# Patient Record
Sex: Male | Born: 1940 | Race: White | Hispanic: No | Marital: Married | Smoking: Former smoker
Health system: Southern US, Community
[De-identification: ages and names within clinical notes are randomized; demographics above are authoritative.]

## PROBLEM LIST (undated history)

## (undated) DIAGNOSIS — N529 Male erectile dysfunction, unspecified: Secondary | ICD-10-CM

## (undated) DIAGNOSIS — R531 Weakness: Secondary | ICD-10-CM

## (undated) DIAGNOSIS — I639 Cerebral infarction, unspecified: Secondary | ICD-10-CM

## (undated) DIAGNOSIS — Z8614 Personal history of Methicillin resistant Staphylococcus aureus infection: Secondary | ICD-10-CM

## (undated) DIAGNOSIS — E538 Deficiency of other specified B group vitamins: Secondary | ICD-10-CM

## (undated) DIAGNOSIS — Z9989 Dependence on other enabling machines and devices: Secondary | ICD-10-CM

## (undated) DIAGNOSIS — H269 Unspecified cataract: Secondary | ICD-10-CM

## (undated) DIAGNOSIS — M1990 Unspecified osteoarthritis, unspecified site: Secondary | ICD-10-CM

## (undated) DIAGNOSIS — K5792 Diverticulitis of intestine, part unspecified, without perforation or abscess without bleeding: Secondary | ICD-10-CM

## (undated) DIAGNOSIS — I10 Essential (primary) hypertension: Secondary | ICD-10-CM

## (undated) DIAGNOSIS — E7800 Pure hypercholesterolemia, unspecified: Secondary | ICD-10-CM

## (undated) DIAGNOSIS — I5032 Chronic diastolic (congestive) heart failure: Secondary | ICD-10-CM

## (undated) DIAGNOSIS — Z8719 Personal history of other diseases of the digestive system: Secondary | ICD-10-CM

## (undated) DIAGNOSIS — C801 Malignant (primary) neoplasm, unspecified: Secondary | ICD-10-CM

## (undated) DIAGNOSIS — H548 Legal blindness, as defined in USA: Secondary | ICD-10-CM

## (undated) DIAGNOSIS — J189 Pneumonia, unspecified organism: Secondary | ICD-10-CM

## (undated) DIAGNOSIS — Z8711 Personal history of peptic ulcer disease: Secondary | ICD-10-CM

## (undated) DIAGNOSIS — F40240 Claustrophobia: Secondary | ICD-10-CM

## (undated) DIAGNOSIS — E119 Type 2 diabetes mellitus without complications: Secondary | ICD-10-CM

## (undated) DIAGNOSIS — I2510 Atherosclerotic heart disease of native coronary artery without angina pectoris: Secondary | ICD-10-CM

## (undated) DIAGNOSIS — I480 Paroxysmal atrial fibrillation: Secondary | ICD-10-CM

## (undated) DIAGNOSIS — J449 Chronic obstructive pulmonary disease, unspecified: Secondary | ICD-10-CM

## (undated) DIAGNOSIS — R600 Localized edema: Secondary | ICD-10-CM

## (undated) DIAGNOSIS — I499 Cardiac arrhythmia, unspecified: Secondary | ICD-10-CM

## (undated) DIAGNOSIS — G4733 Obstructive sleep apnea (adult) (pediatric): Secondary | ICD-10-CM

## (undated) DIAGNOSIS — I878 Other specified disorders of veins: Secondary | ICD-10-CM

## (undated) DIAGNOSIS — I214 Non-ST elevation (NSTEMI) myocardial infarction: Secondary | ICD-10-CM

## (undated) DIAGNOSIS — E1140 Type 2 diabetes mellitus with diabetic neuropathy, unspecified: Secondary | ICD-10-CM

## (undated) DIAGNOSIS — H53489 Generalized contraction of visual field, unspecified eye: Secondary | ICD-10-CM

## (undated) DIAGNOSIS — K8020 Calculus of gallbladder without cholecystitis without obstruction: Secondary | ICD-10-CM

## (undated) DIAGNOSIS — G629 Polyneuropathy, unspecified: Secondary | ICD-10-CM

## (undated) DIAGNOSIS — H9190 Unspecified hearing loss, unspecified ear: Secondary | ICD-10-CM

## (undated) HISTORY — DX: Diverticulitis of intestine, part unspecified, without perforation or abscess without bleeding: K57.92

## (undated) HISTORY — PX: CARDIAC CATHETERIZATION: SHX172

## (undated) HISTORY — DX: Atherosclerotic heart disease of native coronary artery without angina pectoris: I25.10

## (undated) HISTORY — PX: CEREBRAL ANGIOGRAM: SHX1326

## (undated) HISTORY — PX: TOE AMPUTATION: SHX809

## (undated) HISTORY — PX: COLONOSCOPY: SHX174

---

## 1998-06-02 ENCOUNTER — Encounter: Admission: RE | Admit: 1998-06-02 | Discharge: 1998-08-31 | Payer: Self-pay | Admitting: Internal Medicine

## 1998-10-07 ENCOUNTER — Encounter: Admission: RE | Admit: 1998-10-07 | Discharge: 1999-01-02 | Payer: Self-pay | Admitting: Internal Medicine

## 1999-01-20 ENCOUNTER — Encounter: Admission: RE | Admit: 1999-01-20 | Discharge: 1999-01-22 | Payer: Self-pay | Admitting: Internal Medicine

## 1999-04-21 ENCOUNTER — Encounter (HOSPITAL_BASED_OUTPATIENT_CLINIC_OR_DEPARTMENT_OTHER): Payer: Self-pay | Admitting: Internal Medicine

## 1999-04-21 ENCOUNTER — Encounter: Admission: RE | Admit: 1999-04-21 | Discharge: 1999-04-23 | Payer: Self-pay | Admitting: Orthopedic Surgery

## 1999-05-21 ENCOUNTER — Encounter: Payer: Self-pay | Admitting: *Deleted

## 1999-05-21 ENCOUNTER — Inpatient Hospital Stay (HOSPITAL_COMMUNITY): Admission: AD | Admit: 1999-05-21 | Discharge: 1999-05-23 | Payer: Self-pay | Admitting: *Deleted

## 1999-06-23 ENCOUNTER — Encounter: Admission: RE | Admit: 1999-06-23 | Discharge: 1999-09-21 | Payer: Self-pay | Admitting: Orthopedic Surgery

## 2000-02-05 ENCOUNTER — Inpatient Hospital Stay (HOSPITAL_COMMUNITY): Admission: EM | Admit: 2000-02-05 | Discharge: 2000-02-09 | Payer: Self-pay | Admitting: Emergency Medicine

## 2000-02-05 ENCOUNTER — Encounter: Payer: Self-pay | Admitting: Neurology

## 2000-02-08 ENCOUNTER — Encounter: Payer: Self-pay | Admitting: Emergency Medicine

## 2000-02-17 ENCOUNTER — Encounter: Admission: RE | Admit: 2000-02-17 | Discharge: 2000-03-02 | Payer: Self-pay | Admitting: Internal Medicine

## 2000-08-16 ENCOUNTER — Ambulatory Visit (HOSPITAL_BASED_OUTPATIENT_CLINIC_OR_DEPARTMENT_OTHER): Admission: RE | Admit: 2000-08-16 | Discharge: 2000-08-16 | Payer: Self-pay | Admitting: Pulmonary Disease

## 2004-12-18 ENCOUNTER — Ambulatory Visit: Payer: Self-pay | Admitting: Internal Medicine

## 2005-03-15 ENCOUNTER — Ambulatory Visit: Payer: Self-pay | Admitting: Internal Medicine

## 2005-03-17 ENCOUNTER — Ambulatory Visit: Payer: Self-pay | Admitting: Internal Medicine

## 2005-06-29 ENCOUNTER — Ambulatory Visit: Payer: Self-pay | Admitting: Internal Medicine

## 2005-07-07 ENCOUNTER — Ambulatory Visit: Payer: Self-pay | Admitting: Internal Medicine

## 2005-09-24 ENCOUNTER — Ambulatory Visit: Payer: Self-pay | Admitting: Internal Medicine

## 2005-10-06 ENCOUNTER — Ambulatory Visit: Payer: Self-pay | Admitting: Internal Medicine

## 2005-12-27 ENCOUNTER — Ambulatory Visit: Payer: Self-pay | Admitting: Internal Medicine

## 2006-01-05 ENCOUNTER — Ambulatory Visit: Payer: Self-pay | Admitting: Internal Medicine

## 2006-03-29 ENCOUNTER — Ambulatory Visit: Payer: Self-pay | Admitting: Internal Medicine

## 2006-04-05 ENCOUNTER — Ambulatory Visit: Payer: Self-pay | Admitting: Internal Medicine

## 2006-06-27 ENCOUNTER — Ambulatory Visit: Payer: Self-pay | Admitting: Internal Medicine

## 2006-07-05 ENCOUNTER — Ambulatory Visit: Payer: Self-pay | Admitting: Internal Medicine

## 2006-08-16 ENCOUNTER — Ambulatory Visit: Payer: Self-pay | Admitting: Gastroenterology

## 2006-09-19 ENCOUNTER — Ambulatory Visit: Payer: Self-pay | Admitting: Gastroenterology

## 2006-09-30 ENCOUNTER — Ambulatory Visit: Payer: Self-pay | Admitting: Internal Medicine

## 2006-09-30 LAB — CONVERTED CEMR LAB
Calcium: 8.9 mg/dL (ref 8.4–10.5)
GFR calc non Af Amer: 71 mL/min
Glomerular Filtration Rate, Af Am: 86 mL/min/{1.73_m2}
Glucose, Bld: 196 mg/dL — ABNORMAL HIGH (ref 70–99)
Potassium: 4.4 meq/L (ref 3.5–5.1)
Sodium: 139 meq/L (ref 135–145)

## 2006-10-05 ENCOUNTER — Ambulatory Visit: Payer: Self-pay | Admitting: Internal Medicine

## 2006-12-22 ENCOUNTER — Ambulatory Visit: Payer: Self-pay | Admitting: Internal Medicine

## 2006-12-22 LAB — CONVERTED CEMR LAB
BUN: 8 mg/dL (ref 6–23)
Chol/HDL Ratio, serum: 2.7
Creatinine, Ser: 1 mg/dL (ref 0.4–1.5)
LDL Cholesterol: 61 mg/dL (ref 0–99)
VLDL: 15 mg/dL (ref 0–40)

## 2006-12-28 ENCOUNTER — Ambulatory Visit: Payer: Self-pay | Admitting: Internal Medicine

## 2007-03-30 ENCOUNTER — Ambulatory Visit: Payer: Self-pay | Admitting: Internal Medicine

## 2007-03-30 LAB — CONVERTED CEMR LAB
ALT: 19 units/L (ref 0–40)
AST: 19 units/L (ref 0–37)
Albumin: 3.4 g/dL — ABNORMAL LOW (ref 3.5–5.2)
Bilirubin, Direct: 0.1 mg/dL (ref 0.0–0.3)
Calcium: 9 mg/dL (ref 8.4–10.5)
Chloride: 105 meq/L (ref 96–112)
Creatinine, Ser: 0.8 mg/dL (ref 0.4–1.5)
Glucose, Bld: 163 mg/dL — ABNORMAL HIGH (ref 70–99)
Hgb A1c MFr Bld: 7.1 % — ABNORMAL HIGH (ref 4.6–6.0)
Sodium: 139 meq/L (ref 135–145)
Total Bilirubin: 0.6 mg/dL (ref 0.3–1.2)

## 2007-04-04 ENCOUNTER — Ambulatory Visit: Payer: Self-pay | Admitting: Internal Medicine

## 2007-07-10 ENCOUNTER — Ambulatory Visit: Payer: Self-pay | Admitting: Internal Medicine

## 2007-07-10 LAB — CONVERTED CEMR LAB
ALT: 13 units/L (ref 0–53)
Creatinine, Ser: 0.8 mg/dL (ref 0.4–1.5)
Hgb A1c MFr Bld: 7.7 % — ABNORMAL HIGH (ref 4.6–6.0)
Total CHOL/HDL Ratio: 3.4
Triglycerides: 75 mg/dL (ref 0–149)
VLDL: 15 mg/dL (ref 0–40)

## 2007-07-12 DIAGNOSIS — I2510 Atherosclerotic heart disease of native coronary artery without angina pectoris: Secondary | ICD-10-CM | POA: Insufficient documentation

## 2007-07-12 DIAGNOSIS — I872 Venous insufficiency (chronic) (peripheral): Secondary | ICD-10-CM | POA: Insufficient documentation

## 2007-07-12 DIAGNOSIS — Z8673 Personal history of transient ischemic attack (TIA), and cerebral infarction without residual deficits: Secondary | ICD-10-CM | POA: Insufficient documentation

## 2007-07-12 DIAGNOSIS — E291 Testicular hypofunction: Secondary | ICD-10-CM

## 2007-07-12 DIAGNOSIS — E1149 Type 2 diabetes mellitus with other diabetic neurological complication: Secondary | ICD-10-CM

## 2007-07-12 DIAGNOSIS — Z8719 Personal history of other diseases of the digestive system: Secondary | ICD-10-CM

## 2007-07-12 DIAGNOSIS — E538 Deficiency of other specified B group vitamins: Secondary | ICD-10-CM | POA: Insufficient documentation

## 2007-07-12 DIAGNOSIS — H5310 Unspecified subjective visual disturbances: Secondary | ICD-10-CM | POA: Insufficient documentation

## 2007-07-12 DIAGNOSIS — R413 Other amnesia: Secondary | ICD-10-CM

## 2007-07-12 DIAGNOSIS — I10 Essential (primary) hypertension: Secondary | ICD-10-CM

## 2007-07-12 DIAGNOSIS — I252 Old myocardial infarction: Secondary | ICD-10-CM | POA: Insufficient documentation

## 2007-07-12 DIAGNOSIS — E1139 Type 2 diabetes mellitus with other diabetic ophthalmic complication: Secondary | ICD-10-CM

## 2007-07-14 ENCOUNTER — Ambulatory Visit: Payer: Self-pay | Admitting: Internal Medicine

## 2007-10-02 ENCOUNTER — Ambulatory Visit: Payer: Self-pay | Admitting: Internal Medicine

## 2007-10-02 LAB — CONVERTED CEMR LAB
AST: 21 units/L (ref 0–37)
Albumin: 3.3 g/dL — ABNORMAL LOW (ref 3.5–5.2)
Bilirubin, Direct: 0.2 mg/dL (ref 0.0–0.3)
Chloride: 106 meq/L (ref 96–112)
Creatinine, Ser: 0.9 mg/dL (ref 0.4–1.5)
Glucose, Bld: 187 mg/dL — ABNORMAL HIGH (ref 70–99)
Hgb A1c MFr Bld: 8 % — ABNORMAL HIGH (ref 4.6–6.0)
Potassium: 4.5 meq/L (ref 3.5–5.1)
Sodium: 143 meq/L (ref 135–145)
Total Bilirubin: 0.7 mg/dL (ref 0.3–1.2)
Total Protein: 6.3 g/dL (ref 6.0–8.3)
Vitamin B-12: 706 pg/mL (ref 211–911)

## 2007-10-11 ENCOUNTER — Ambulatory Visit: Payer: Self-pay | Admitting: Internal Medicine

## 2007-10-11 ENCOUNTER — Encounter: Payer: Self-pay | Admitting: Internal Medicine

## 2007-10-11 DIAGNOSIS — E1149 Type 2 diabetes mellitus with other diabetic neurological complication: Secondary | ICD-10-CM

## 2007-10-11 DIAGNOSIS — R609 Edema, unspecified: Secondary | ICD-10-CM

## 2007-11-03 ENCOUNTER — Encounter: Payer: Self-pay | Admitting: Internal Medicine

## 2008-02-15 ENCOUNTER — Telehealth: Payer: Self-pay | Admitting: Internal Medicine

## 2008-04-06 LAB — HM COLONOSCOPY

## 2008-04-15 ENCOUNTER — Ambulatory Visit: Payer: Self-pay | Admitting: Internal Medicine

## 2008-04-15 LAB — CONVERTED CEMR LAB
ALT: 17 units/L (ref 0–53)
Alkaline Phosphatase: 52 units/L (ref 39–117)
Bilirubin, Direct: 0.2 mg/dL (ref 0.0–0.3)
CO2: 30 meq/L (ref 19–32)
Calcium: 8.9 mg/dL (ref 8.4–10.5)
Chloride: 108 meq/L (ref 96–112)
Glucose, Bld: 150 mg/dL — ABNORMAL HIGH (ref 70–99)
Potassium: 4.4 meq/L (ref 3.5–5.1)
Sodium: 141 meq/L (ref 135–145)
Total Bilirubin: 0.7 mg/dL (ref 0.3–1.2)
Total CHOL/HDL Ratio: 3.2
Total Protein: 6.2 g/dL (ref 6.0–8.3)

## 2008-04-24 ENCOUNTER — Ambulatory Visit: Payer: Self-pay | Admitting: Internal Medicine

## 2008-04-24 DIAGNOSIS — R0602 Shortness of breath: Secondary | ICD-10-CM | POA: Insufficient documentation

## 2008-05-08 ENCOUNTER — Telehealth: Payer: Self-pay | Admitting: Internal Medicine

## 2008-08-20 ENCOUNTER — Ambulatory Visit: Payer: Self-pay | Admitting: Internal Medicine

## 2008-08-20 LAB — CONVERTED CEMR LAB
BUN: 15 mg/dL (ref 6–23)
CO2: 31 meq/L (ref 19–32)
Calcium: 9 mg/dL (ref 8.4–10.5)
Glucose, Bld: 159 mg/dL — ABNORMAL HIGH (ref 70–99)
PSA: 1.15 ng/mL (ref 0.10–4.00)
Sodium: 141 meq/L (ref 135–145)

## 2008-08-27 ENCOUNTER — Ambulatory Visit: Payer: Self-pay | Admitting: Internal Medicine

## 2008-09-24 ENCOUNTER — Encounter: Payer: Self-pay | Admitting: Internal Medicine

## 2008-09-25 ENCOUNTER — Ambulatory Visit: Payer: Self-pay | Admitting: Internal Medicine

## 2008-11-22 ENCOUNTER — Encounter: Payer: Self-pay | Admitting: Internal Medicine

## 2008-11-29 ENCOUNTER — Ambulatory Visit: Payer: Self-pay | Admitting: Vascular Surgery

## 2008-12-09 ENCOUNTER — Telehealth: Payer: Self-pay | Admitting: Internal Medicine

## 2009-01-10 ENCOUNTER — Ambulatory Visit: Payer: Self-pay | Admitting: Internal Medicine

## 2009-01-10 LAB — CONVERTED CEMR LAB
CO2: 32 meq/L (ref 19–32)
Chloride: 103 meq/L (ref 96–112)
Creatinine, Ser: 0.8 mg/dL (ref 0.4–1.5)
Glucose, Bld: 167 mg/dL — ABNORMAL HIGH (ref 70–99)
Hgb A1c MFr Bld: 6.8 % — ABNORMAL HIGH (ref 4.6–6.0)
Sodium: 140 meq/L (ref 135–145)

## 2009-01-22 ENCOUNTER — Ambulatory Visit: Payer: Self-pay | Admitting: Internal Medicine

## 2009-01-22 DIAGNOSIS — M8610 Other acute osteomyelitis, unspecified site: Secondary | ICD-10-CM | POA: Insufficient documentation

## 2009-01-22 DIAGNOSIS — L723 Sebaceous cyst: Secondary | ICD-10-CM

## 2009-03-06 ENCOUNTER — Emergency Department (HOSPITAL_COMMUNITY): Admission: EM | Admit: 2009-03-06 | Discharge: 2009-03-06 | Payer: Self-pay | Admitting: Emergency Medicine

## 2009-04-08 ENCOUNTER — Encounter: Payer: Self-pay | Admitting: Internal Medicine

## 2009-04-10 ENCOUNTER — Encounter: Payer: Self-pay | Admitting: Internal Medicine

## 2009-04-16 ENCOUNTER — Ambulatory Visit: Payer: Self-pay | Admitting: Internal Medicine

## 2009-04-17 ENCOUNTER — Encounter: Payer: Self-pay | Admitting: Internal Medicine

## 2009-04-18 LAB — CONVERTED CEMR LAB
CO2: 31 meq/L (ref 19–32)
Chloride: 106 meq/L (ref 96–112)
Glucose, Bld: 149 mg/dL — ABNORMAL HIGH (ref 70–99)
Hgb A1c MFr Bld: 8 % — ABNORMAL HIGH (ref 4.6–6.5)
Microalb Creat Ratio: 0.8 mg/g (ref 0.0–30.0)
Microalb, Ur: 0.1 mg/dL (ref 0.0–1.9)
Sodium: 141 meq/L (ref 135–145)

## 2009-04-22 ENCOUNTER — Ambulatory Visit: Payer: Self-pay | Admitting: Internal Medicine

## 2009-05-16 ENCOUNTER — Telehealth: Payer: Self-pay | Admitting: Internal Medicine

## 2010-01-14 ENCOUNTER — Telehealth: Payer: Self-pay | Admitting: Internal Medicine

## 2010-01-28 ENCOUNTER — Ambulatory Visit: Payer: Self-pay | Admitting: Internal Medicine

## 2010-01-28 LAB — CONVERTED CEMR LAB
Alkaline Phosphatase: 52 units/L (ref 39–117)
Bilirubin, Direct: 0.2 mg/dL (ref 0.0–0.3)
Calcium: 8.8 mg/dL (ref 8.4–10.5)
GFR calc non Af Amer: 89.08 mL/min (ref >60)
Sodium: 142 meq/L (ref 135–145)
Total Bilirubin: 0.4 mg/dL (ref 0.3–1.2)

## 2010-02-02 ENCOUNTER — Ambulatory Visit: Payer: Self-pay | Admitting: Internal Medicine

## 2010-02-02 DIAGNOSIS — R635 Abnormal weight gain: Secondary | ICD-10-CM

## 2010-02-02 DIAGNOSIS — R42 Dizziness and giddiness: Secondary | ICD-10-CM

## 2010-02-02 DIAGNOSIS — Z87891 Personal history of nicotine dependence: Secondary | ICD-10-CM

## 2010-02-09 ENCOUNTER — Telehealth: Payer: Self-pay | Admitting: Internal Medicine

## 2010-04-29 ENCOUNTER — Ambulatory Visit: Payer: Self-pay | Admitting: Internal Medicine

## 2010-04-29 LAB — CONVERTED CEMR LAB
CO2: 31 meq/L (ref 19–32)
Chloride: 104 meq/L (ref 96–112)
GFR calc non Af Amer: 96.39 mL/min (ref >60)
Glucose, Bld: 114 mg/dL — ABNORMAL HIGH (ref 70–99)
Potassium: 5.2 meq/L — ABNORMAL HIGH (ref 3.5–5.1)
Sodium: 140 meq/L (ref 135–145)

## 2010-05-05 ENCOUNTER — Ambulatory Visit: Payer: Self-pay | Admitting: Internal Medicine

## 2010-05-05 DIAGNOSIS — M25569 Pain in unspecified knee: Secondary | ICD-10-CM

## 2010-08-28 ENCOUNTER — Ambulatory Visit: Payer: Self-pay | Admitting: Internal Medicine

## 2010-08-28 LAB — CONVERTED CEMR LAB
ALT: 25 units/L (ref 0–53)
AST: 28 units/L (ref 0–37)
Albumin: 3.3 g/dL — ABNORMAL LOW (ref 3.5–5.2)
Basophils Relative: 0.3 % (ref 0.0–3.0)
Bilirubin Urine: NEGATIVE
CO2: 31 meq/L (ref 19–32)
Calcium: 8.9 mg/dL (ref 8.4–10.5)
Creatinine, Ser: 0.9 mg/dL (ref 0.4–1.5)
HDL: 38.4 mg/dL — ABNORMAL LOW (ref >39.00)
Hemoglobin, Urine: NEGATIVE
Ketones, ur: NEGATIVE mg/dL
LDL Cholesterol: 77 mg/dL (ref 0–99)
Leukocytes, UA: NEGATIVE
Lymphocytes Relative: 12.2 % (ref 12.0–46.0)
Lymphs Abs: 0.9 10*3/uL (ref 0.7–4.0)
MCHC: 34.1 g/dL (ref 30.0–36.0)
Microalb, Ur: 1.4 mg/dL (ref 0.0–1.9)
Monocytes Absolute: 0.6 10*3/uL (ref 0.1–1.0)
Neutro Abs: 6.1 10*3/uL (ref 1.4–7.7)
Neutrophils Relative %: 77.8 % — ABNORMAL HIGH (ref 43.0–77.0)
Nitrite: NEGATIVE
RBC: 4.21 M/uL — ABNORMAL LOW (ref 4.22–5.81)
RDW: 14.9 % — ABNORMAL HIGH (ref 11.5–14.6)
TSH: 1.61 microintl units/mL (ref 0.35–5.50)
Total Bilirubin: 0.6 mg/dL (ref 0.3–1.2)
Total CHOL/HDL Ratio: 3
Total Protein: 6.1 g/dL (ref 6.0–8.3)
Triglycerides: 60 mg/dL (ref 0.0–149.0)
Urobilinogen, UA: 1 (ref 0.0–1.0)

## 2010-09-01 ENCOUNTER — Ambulatory Visit: Payer: Self-pay | Admitting: Internal Medicine

## 2010-09-14 ENCOUNTER — Emergency Department (HOSPITAL_COMMUNITY): Admission: EM | Admit: 2010-09-14 | Discharge: 2010-09-14 | Payer: Self-pay | Admitting: Emergency Medicine

## 2010-11-25 ENCOUNTER — Ambulatory Visit: Payer: Self-pay | Admitting: Internal Medicine

## 2010-12-02 ENCOUNTER — Ambulatory Visit: Payer: Self-pay | Admitting: Internal Medicine

## 2010-12-18 ENCOUNTER — Other Ambulatory Visit: Payer: Self-pay | Admitting: Internal Medicine

## 2010-12-18 LAB — BASIC METABOLIC PANEL
BUN: 10 mg/dL (ref 6–23)
CO2: 30 mEq/L (ref 19–32)
Calcium: 8.9 mg/dL (ref 8.4–10.5)
Chloride: 105 mEq/L (ref 96–112)
Creatinine, Ser: 0.9 mg/dL (ref 0.4–1.5)
GFR: 84.5 mL/min (ref >60.00)
Glucose, Bld: 123 mg/dL — ABNORMAL HIGH (ref 70–99)
Potassium: 4.3 mEq/L (ref 3.5–5.1)
Sodium: 141 mEq/L (ref 135–145)

## 2010-12-18 LAB — VITAMIN B12: Vitamin B-12: 871 pg/mL (ref 211–911)

## 2010-12-18 LAB — HEMOGLOBIN A1C: Hgb A1c MFr Bld: 6.7 % — ABNORMAL HIGH (ref 4.6–6.5)

## 2010-12-24 ENCOUNTER — Ambulatory Visit
Admission: RE | Admit: 2010-12-24 | Discharge: 2010-12-24 | Payer: Self-pay | Source: Home / Self Care | Attending: Internal Medicine | Admitting: Internal Medicine

## 2010-12-24 DIAGNOSIS — D485 Neoplasm of uncertain behavior of skin: Secondary | ICD-10-CM | POA: Insufficient documentation

## 2011-01-11 ENCOUNTER — Encounter: Payer: Self-pay | Admitting: Internal Medicine

## 2011-01-11 ENCOUNTER — Ambulatory Visit
Admission: RE | Admit: 2011-01-11 | Discharge: 2011-01-11 | Payer: Self-pay | Source: Home / Self Care | Attending: Internal Medicine | Admitting: Internal Medicine

## 2011-01-14 NOTE — Progress Notes (Signed)
Summary: nitrostat  Phone Note Other Incoming Call back at fax   Caller: [facility] pharmacy Details for Reason: refill request on nitrostat Details of Action Taken: ok x 12 refills/vg Initial call taken by: Tora Perches,  February 09, 2010 10:14 AM    Prescriptions: NITROSTAT 0.4 MG SUBL (NITROGLYCERIN) prn  #25 x 12   Entered by:   Tora Perches   Authorized by:   Tresa Garter MD   Signed by:   Tora Perches on 02/09/2010   Method used:   Faxed to ...       [facility]* (retail)       [phone] [place], [place]  [address]       Ph: [phone]       Fax: [phone]   RxID:   [id number]

## 2011-01-14 NOTE — Assessment & Plan Note (Signed)
Summary: 4 MOS WELL WITH LABS/#CD   Vital Signs:  Patient profile:   70 year old male Height:      74 inches Weight:      379 pounds BMI:     48.84 Temp:     98.4 degrees F oral Pulse rate:   84 / minute Pulse rhythm:   regular Resp:     16 per minute BP sitting:   130 / 80  (left arm) Cuff size:   regular  Vitals Entered By: Lanier Prude, Beverly Gust) (September 01, 2010 2:05 PM) CC: f/u Is Patient Diabetic? Yes   CC:  f/u.  History of Present Illness: The patient presents for a follow up of hypertension, diabetes, hyperlipidemia Dr Ralene Cork is treating him for toe infection now The patient presents for a preventive health examination  Patient past medical history, social history, and family history reviewed in detail no significant changes.  Patient is physically active. Depression is negative and mood is good. Hearing is normal, and able to perform activities of daily living. Risk of falling is negligible and home safety has been reviewed and is appropriate. Patient has normal height,overweight, impaired visual acuity w/glasses. Patient has been counseled on age-appropriate routine health concerns for screening and prevention. Education, counseling done.   Preventive Screening-Counseling & Management  Alcohol-Tobacco     Alcohol drinks/day: 0     Smoking Status: quit  Caffeine-Diet-Exercise     Caffeine Counseling: not indicated; caffeine use is not excessive or problematic     Diet Counseling: to improve diet; diet is suboptimal     Does Patient Exercise: no     Exercise Counseling: to improve exercise regimen     Depression Counseling: not indicated; screening negative for depression  Hep-HIV-STD-Contraception     Sun Exposure Counseling: not indicated; sun exposure is acceptable  Safety-Violence-Falls     Seat Belt Use: yes     Fall Risk Counseling: counseling provided; falls with injury noted      Sexual History:  currently monogamous.    Current Medications  (verified): 1)  Adult Aspirin Low Strength 81 Mg  Tbdp (Aspirin) .... Once Daily 2)  Actos 45 Mg  Tabs (Pioglitazone Hcl) .... Once Daily 3)  Pravachol 40 Mg  Tabs (Pravastatin Sodium) .... Once Daily 4)  Humalog 100 Unit/ml  Soln (Insulin Lispro (Human)) .Marland Kitchen.. 09-21-09 and A Ss 5)  Zaroxolyn 10 Mg  Tabs (Metolazone) .... Once Daily 6)  Amaryl 4 Mg  Tabs (Glimepiride) .... Two Times A Day 7)  Plavix 75 Mg  Tabs (Clopidogrel Bisulfate) .... Once Daily 8)  Furosemide 80 Mg  Tabs (Furosemide) .... Once Daily 9)  Freestyle Test   Strp (Glucose Blood) .... As Directed 10)  Diovan 160 Mg Tabs (Valsartan) .... Take 1 Tab Daily 11)  Metoprolol Tartrate 50 Mg Tabs (Metoprolol Tartrate) .Marland Kitchen.. 1 Tab Twice A Day 12)  Vitamin D3 1000 Unit  Tabs (Cholecalciferol) .Marland Kitchen.. 1 By Mouth Daily 13)  Nitrostat 0.4 Mg Subl (Nitroglycerin) .... Prn 14)  Gabapentin 100 Mg Caps (Gabapentin) .... 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  The patient denies fever, abdominal pain, anorexia, weight loss, weight gain, vision loss, decreased hearing, hoarseness, chest pain, syncope, peripheral edema, prolonged cough, headaches, hemoptysis, melena, hematochezia, severe indigestion/heartburn,  hematuria, incontinence, genital sores, muscle weakness, suspicious skin lesions, transient blindness, depression, unusual weight change, abnormal bleeding, enlarged lymph nodes, angioedema, and testicular masses.         Lost wt on diet  Physical Exam  General:  Very overweight-appearing.   Head:  Normocephalic and atraumatic without obvious abnormalities. No apparent alopecia or balding. Eyes:  No corneal or conjunctival inflammation noted. EOMI. Perrla. Ears:  External ear exam shows no significant lesions or deformities.  Otoscopic examination reveals clear canals, tympanic membranes are intact bilaterally without bulging, retraction, inflammation or discharge. Hearing is grossly decreased bilaterally. Nose:  External nasal examination shows no deformity or inflammation. Nasal mucosa are pink and moist without lesions or exudates. Mouth:  Oral mucosa and oropharynx without lesions or exudates.  Teeth in good repair. Neck:  No deformities, masses, or tenderness noted. Lungs:  Normal respiratory effort, chest expands symmetrically. Lungs are clear to auscultation, no crackles or wheezes. Heart:  Normal rate and regular rhythm. S1 and S2 normal without gallop, murmur, click, rub or other extra sounds. Abdomen:  Bowel sounds positive,abdomen soft and non-tender without masses, organomegaly or hernias noted. Rectal:  declined Prostate:  declined Msk:  No deformity or scoliosis noted of thoracic or lumbar spine.  L knee is tender medially Pulses:  B decreased Extremities:  trace to 1+ B edema LE  Neurologic:  No cranial nerve deficits noted. Station and gait are normal. Plantar reflexes are down-going bilaterally. DTRs are symmetrical throughout. Sensory, motor and coordinative functions appear intact. Skin:  clear Cervical Nodes:  No lymphadenopathy noted Psych:  Cognition and judgment appear intact. Alert and cooperative with normal attention span and concentration. No apparent delusions,  illusions, hallucinations   Impression & Recommendations:  Problem # 1:  HEALTH MAINTENANCE EXAM (ICD-V70.0) Assessment New  Colon 2007 - refusing any future colonoscopies Overall doing well, age appropriate education and counseling updated and referral for appropriate preventive services done unless declined, immunizations up to date or declined, diet counseling done if overweight, urged to quit smoking if smokes, most recent labs reviewed and current ordered if appropriate, ecg reviewed or declined (interpretation per ECG scanned in the EMR if done); information regarding Medicare Preventation requirements given if appropriate.  The labs were reviewed with the patient.   Orders: Medicare -1st Annual Wellness Visit [phone])  Problem # 2:  EDEMA (ICD-782.3) Assessment: Deteriorated Compr socks His updated medication list for this problem includes:    Zaroxolyn 10 Mg Tabs (Metolazone) ..... Once daily    Furosemide 80 Mg Tabs (Furosemide) ..... Once daily  Problem # 3:  VITAMIN B12 DEFICIENCY (ICD-266.2) Assessment: Unchanged On the regimen of medicine(s) reflected in the chart    Problem # 4:  CORONARY ARTERY DISEASE (ICD-414.00) Assessment: Unchanged  His updated medication list for this problem includes:    Adult Aspirin Low Strength 81 Mg Tbdp (Aspirin) ..... Once daily    Zaroxolyn 10 Mg Tabs (Metolazone) ..... Once daily    Plavix 75 Mg Tabs (Clopidogrel bisulfate) ..... Once  daily    Furosemide 80 Mg Tabs (Furosemide) ..... Once daily    Diovan 160 Mg Tabs (Valsartan) .Marland Kitchen... Take 1 tab daily    Metoprolol Tartrate 50 Mg Tabs (Metoprolol tartrate) .Marland Kitchen... 1 tab twice a day    Nitrostat 0.4 Mg Subl (Nitroglycerin) .Marland Kitchen... Prn  Problem # 5:  DIABETES MELLITUS, TYPE II (ICD-250.00) Assessment: Unchanged  His updated medication list for this problem includes:    Adult Aspirin Low Strength 81 Mg Tbdp (Aspirin) ..... Once daily    Actos 45 Mg Tabs (Pioglitazone hcl) ..... Once  daily    Humalog 100 Unit/ml Soln (Insulin lispro (human)) .Marland KitchenMarland KitchenMarland KitchenMarland Kitchen 09-21-09 and a ss    Amaryl 4 Mg Tabs (Glimepiride) .Marland Kitchen..Marland Kitchen Two times a day    Diovan 160 Mg Tabs (Valsartan) .Marland Kitchen... Take 1 tab daily  Labs Reviewed: Creat: 0.9 (08/28/2010)    Reviewed HgBA1c results: 7.0 (08/28/2010)  6.2 (04/29/2010)  Problem # 6:  HYPERTENSION (ICD-401.9) Assessment: Unchanged  His updated medication list for this problem includes:    Zaroxolyn 10 Mg Tabs (Metolazone) ..... Once daily    Furosemide 80 Mg Tabs (Furosemide) ..... Once daily    Diovan 160 Mg Tabs (Valsartan) .Marland Kitchen... Take 1 tab daily    Metoprolol Tartrate 50 Mg Tabs (Metoprolol tartrate) .Marland Kitchen... 1 tab twice a day  BP today: 130/80 Prior BP: 124/74 (05/05/2010)  Labs Reviewed: K+: 4.8 (08/28/2010) Creat: : 0.9 (08/28/2010)   Chol: 127 (08/28/2010)   HDL: 38.40 (08/28/2010)   LDL: 77 (08/28/2010)   TG: 60.0 (08/28/2010)  Complete Medication List: 1)  Adult Aspirin Low Strength 81 Mg Tbdp (Aspirin) .... Once daily 2)  Actos 45 Mg Tabs (Pioglitazone hcl) .... Once daily 3)  Pravachol 40 Mg Tabs (Pravastatin sodium) .... Once daily 4)  Humalog 100 Unit/ml Soln (Insulin lispro (human)) .Marland Kitchen.. 09-21-09 and a ss 5)  Zaroxolyn 10 Mg Tabs (Metolazone) .... Once daily 6)  Amaryl 4 Mg Tabs (Glimepiride) .... Two times a day 7)  Plavix 75 Mg Tabs (Clopidogrel bisulfate) .... Once daily 8)  Furosemide 80 Mg Tabs (Furosemide) .... Once daily 9)  Diovan 160 Mg Tabs (Valsartan) .... Take 1 tab daily 10)  Metoprolol Tartrate 50 Mg Tabs (Metoprolol tartrate) .Marland Kitchen.. 1 tab twice a day 11)  Vitamin D3 1000 Unit Tabs (Cholecalciferol) .Marland Kitchen.. 1 by mouth daily 12)  Nitrostat 0.4 Mg Subl (Nitroglycerin) .... Prn 13)  Gabapentin 100 Mg Caps (Gabapentin) .... Two times a day 14)  Vitamin B-12 1000 Mcg Tabs (Cyanocobalamin) 15)  Doxycycline Hyclate 100 Mg Caps (Doxycycline hyclate) .Marland Kitchen.. 1 by mouth two times a day with a glass of water 16)  Bd Autoshield 29g X 12mm  Misc (Insulin pen needle) .... As dirrected 17)  Freestyle Lite Test Strp (Glucose blood) .... Check qid  Other Orders: Admin 1st Vaccine (16109) Flu Vaccine 33yrs + (60454)  Patient Instructions: 1)  Please schedule a follow-up appointment in 3 months. 2)  BMP prior to visit, ICD-9: 3)  HbgA1C prior to visit, ICD-9 4)  Vit B12  250.00  266.20: Prescriptions: FREESTYLE LITE TEST  STRP (GLUCOSE BLOOD) check qid  #150 x 11   Entered and Authorized by:   Tresa Garter MD   Signed by:   Tresa Garter MD on 09/01/2010   Method used:   Print then Give to Patient   RxID:   [id number] FREESTYLE TEST   STRP (GLUCOSE BLOOD) as directed qid  #150 x 11   Entered and Authorized by:  Tresa Garter MD   Signed by:   Tresa Garter MD on 09/01/2010   Method used:   Print then Give to Patient   RxID:   [id number] BD AUTOSHIELD 29G X MISC (INSULIN PEN NEEDLE) as dirrected  #100 x 12   Entered and Authorized by:   Tresa Garter MD   Signed by:   Tresa Garter MD on 09/01/2010   Method used:   Print then Give to Patient   RxID:   [id number] DOXYCYCLINE HYCLATE 100 MG CAPS (DOXYCYCLINE HYCLATE) 1 by mouth two times a day with a glass of water  #20 x 1   Entered and Authorized by:   Tresa Garter MD   Signed by:   Tresa Garter MD on 09/01/2010   Method used:   Print then Give to Patient   RxID:   [id number]  .lbflu   Flu Vaccine Consent Questions     Do you have a history of severe allergic reactions to this vaccine? no    Any prior history of allergic reactions to egg and/or gelatin? no    Do you have a sensitivity to the preservative Thimersol? no    Do you have a past history of Guillan-Barre Syndrome? no    Do you currently have an acute febrile illness? no    Have you ever had a severe reaction to latex? no    Vaccine information given and explained to patient? yes    Are you currently pregnant? no    Lot  Number:[id number]   Exp Date:06/12/2011   Site Given  Left Deltoid IM Lanier Prude, [facility])  September 01, 2010 2:11 PM

## 2011-01-14 NOTE — Assessment & Plan Note (Signed)
Summary: OV--STC   Vital Signs:  Patient profile:   70 year old male Height:      74 inches Weight:      392 pounds BMI:     50.51 Temp:     98.8 degrees F oral Pulse rate:   80 / minute Pulse rhythm:   regular Resp:     16 per minute BP sitting:   140 / 80  (left arm) Cuff size:   regular  Vitals Entered By: Lanier Prude, CMA(AAMA) (December 24, 2010 8:22 AM) CC: f/u  Is Patient Diabetic? Yes   CC:  f/u .  History of Present Illness: The patient presents for a follow up of hypertension, diabetes, hyperlipidemia   Current Medications (verified): 1)  Adult Aspirin Low Strength 81 Mg  Tbdp (Aspirin) .... Once Daily 2)  Actos 45 Mg  Tabs (Pioglitazone Hcl) .... Once Daily 3)  Pravachol 40 Mg  Tabs (Pravastatin Sodium) .... Once Daily 4)  Humalog 100 Unit/ml  Soln (Insulin Lispro (Human)) .Marland Kitchen.. 09-21-09 and A Ss 5)  Zaroxolyn 10 Mg  Tabs (Metolazone) .... Once Daily 6)  Amaryl 4 Mg  Tabs (Glimepiride) .... Two Times A Day 7)  Plavix 75 Mg  Tabs (Clopidogrel Bisulfate) .... Once Daily 8)  Furosemide 80 Mg  Tabs (Furosemide) .... Once Daily 9)  Diovan 160 Mg Tabs (Valsartan) .... Take 1 Tab Daily 10)  Metoprolol Tartrate 50 Mg Tabs (Metoprolol Tartrate) .Marland Kitchen.. 1 Tab Twice A Day 11)  Vitamin D3 1000 Unit  Tabs (Cholecalciferol) .Marland Kitchen.. 1 By Mouth Daily 12)  Nitrostat 0.4 Mg Subl (Nitroglycerin) .... Prn 13)  Gabapentin 100 Mg Caps (Gabapentin) .... Two Times A Day 14)  Vitamin B-12 1000 Mcg Tabs (Cyanocobalamin) 15)  Bd Autoshield 29g X 12mm Misc (Insulin Pen Needle) .... As Dirrected 16)  Freestyle Lite Test  Strp (Glucose Blood) .... 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   Eyes:  No corneal or conjunctival inflammation noted. EOMI. Perrla. Ears:  External ear exam shows no significant lesions or deformities.   Nose:  External nasal examination shows no deformity or inflammation. Nasal mucosa are pink and moist without lesions or exudates. Mouth:  Oral mucosa and oropharynx without lesions or exudates.  Teeth in good repair. Neck:  No deformities, masses, or tenderness noted. Lungs:  Normal respiratory effort, chest expands symmetrically. Lungs are clear to auscultation, no crackles or wheezes. Heart:  Normal rate and regular rhythm. S1 and S2 normal without gallop, murmur, click, rub or other extra sounds. Abdomen:  Bowel sounds positive,abdomen soft and non-tender without masses, organomegaly or hernias noted. Msk:  No deformity or scoliosis noted of thoracic or lumbar spine.  L knee is tender medially Extremities:  trace to 1+ B edema LE  Neurologic:  No cranial nerve deficits noted. Station and gait are normal. Plantar reflexes are down-going bilaterally. DTRs are symmetrical throughout. Sensory, motor and coordinative functions appear intact. Skin:  flaky feet ok Psych:  Cognition and judgment appear intact. Alert and cooperative with normal attention span and concentration. No apparent delusions, illusions, hallucinations   Impression & Recommendations:  Problem # 1:  EDEMA (ICD-782.3)  Assessment Improved  His updated medication list for this problem includes:    Zaroxolyn 10 Mg Tabs (Metolazone) ..... Once daily    Furosemide 80 Mg Tabs (Furosemide) ..... Once daily  Problem # 2:  DIABETES MELLITUS, TYPE II (ICD-250.00) Assessment: Unchanged  His updated medication list for this problem includes:    Adult Aspirin Low Strength 81 Mg Tbdp (Aspirin) ..... Once daily    Actos 45 Mg Tabs (Pioglitazone hcl) ..... Once daily    Humalog 100 Unit/ml Soln  (Insulin lispro (human)) .Marland KitchenMarland KitchenMarland KitchenMarland Kitchen 09-21-09 and a ss    Amaryl 4 Mg Tabs (Glimepiride) .Marland Kitchen..Marland Kitchen Two times a day    Diovan 160 Mg Tabs (Valsartan) .Marland Kitchen... Take 1 tab daily  Orders: Cardiology Referral (Cardiology)  Problem # 3:  CORONARY ARTERY DISEASE (ICD-414.00) Assessment: Unchanged  His updated medication list for this problem includes:    Adult Aspirin Low Strength 81 Mg Tbdp (Aspirin) ..... Once daily    Zaroxolyn 10 Mg Tabs (Metolazone) ..... Once daily    Plavix 75 Mg Tabs (Clopidogrel bisulfate) ..... Once daily    Furosemide 80 Mg Tabs (Furosemide) ..... Once daily    Diovan 160 Mg Tabs (Valsartan) .Marland Kitchen... Take 1 tab daily    Metoprolol Tartrate 50 Mg Tabs (Metoprolol tartrate) .Marland Kitchen... 1 tab twice a day    Nitrostat 0.4 Mg Subl (Nitroglycerin) .Marland Kitchen... Prn  Orders: Cardiology Referral (Cardiology)  Problem # 4:  VITAMIN B12 DEFICIENCY (ICD-266.2) Assessment: Unchanged On the regimen of medicine(s) reflected in the chart    Problem # 5:  HYPERTENSION (ICD-401.9) Assessment: Unchanged  Orders: Cardiology Referral (Cardiology)  His updated medication list for this problem includes:    Zaroxolyn 10 Mg Tabs (Metolazone) ..... Once daily    Furosemide 80 Mg Tabs (Furosemide) ..... Once daily    Diovan 160 Mg Tabs (Valsartan) .Marland Kitchen... Take 1 tab daily    Metoprolol Tartrate 50 Mg Tabs (Metoprolol tartrate) .Marland Kitchen... 1 tab twice a day  Complete Medication List: 1)  Adult Aspirin Low Strength 81 Mg Tbdp (Aspirin) .... Once daily 2)  Actos 45 Mg Tabs (Pioglitazone hcl) .... Once daily 3)  Pravachol 40 Mg Tabs (Pravastatin sodium) .... Once daily 4)  Humalog 100 Unit/ml Soln (Insulin lispro (human)) .Marland Kitchen.. 09-21-09 and a ss 5)  Zaroxolyn 10 Mg Tabs (Metolazone) .... Once daily 6)  Amaryl 4 Mg Tabs (Glimepiride) .... Two times a day 7)  Plavix 75 Mg Tabs (Clopidogrel bisulfate) .... Once daily 8)  Furosemide 80 Mg Tabs (Furosemide) .... Once daily 9)  Diovan 160 Mg Tabs (Valsartan) .... Take 1  tab daily 10)  Metoprolol Tartrate 50 Mg Tabs (Metoprolol tartrate) .Marland Kitchen.. 1 tab twice a day 11)  Vitamin D3 1000 Unit Tabs (Cholecalciferol) .Marland Kitchen.. 1 by mouth daily 12)  Nitrostat 0.4 Mg Subl (Nitroglycerin) .... Prn 13)  Gabapentin 100 Mg Caps (Gabapentin) .... Two times a day 14)  Vitamin B-12 1000 Mcg Tabs (Cyanocobalamin) 15)  Bd Autoshield 29g X 12mm Misc (Insulin pen needle) .... As dirrected 16)  Freestyle Lite Test Strp (Glucose blood) .... Check qid  Other Orders: Dermatology Referral (Derma)  Patient Instructions: 1)  Please schedule a follow-up appointment in 4 months. 2)  BMP prior to visit, ICD-9: 3)  Hepatic Panel prior to visit, ICD-9: 4)  HbgA1C prior to visit, ICD-9:250.00 5)  Lipid Panel prior to visit, ICD-9:272.0   Orders Added: 1)  Est. Patient Level IV [54098] 2)  Cardiology Referral [Cardiology] 3)  Dermatology Referral [Derma]

## 2011-01-14 NOTE — Assessment & Plan Note (Signed)
Summary: fu--stc   Vital Signs:  Patient profile:   70 year old male Weight:      386 pounds BMI:     47.16 Temp:     96.9 degrees F oral Pulse rate:   82 / minute BP sitting:   144 / 64  (left arm)  Vitals Entered By: Tora Perches (February 02, 2010 3:32 PM) CC: f/u Is Patient Diabetic? Yes   CC:  f/u.  History of Present Illness: The patient presents for a follow up of hypertension, diabetes, hyperlipidemia. Had a dizzy spell 1 months ago that is gone now.    Preventive Screening-Counseling & Management  Alcohol-Tobacco     Smoking Status: quit  Current Medications (verified): 1)  Adult Aspirin Low Strength 81 Mg  Tbdp (Aspirin) .... Once Daily 2)  Actos 45 Mg  Tabs (Pioglitazone Hcl) .... Once Daily 3)  Pravachol 40 Mg  Tabs (Pravastatin Sodium) .... Once Daily 4)  Humalog 100 Unit/ml  Soln (Insulin Lispro (Human)) .Marland Kitchen.. 09-21-09 and A Ss 5)  Zaroxolyn 10 Mg  Tabs (Metolazone) .... Once Daily 6)  Amaryl 4 Mg  Tabs (Glimepiride) .... Two Times A Day 7)  Plavix 75 Mg  Tabs (Clopidogrel Bisulfate) .... Once Daily 8)  Furosemide 80 Mg  Tabs (Furosemide) .... Once Daily 9)  Freestyle Test   Strp (Glucose Blood) .... As Directed 10)  Diovan 160 Mg Tabs (Valsartan) .... Take 1 Tab Daily 11)  Metoprolol Tartrate 50 Mg Tabs (Metoprolol Tartrate) .Marland Kitchen.. 1 Tab Twice A Day 12)  Vitamin D3 1000 Unit  Tabs (Cholecalciferol) .Marland Kitchen.. 1 By Mouth Daily 13)  Nitrostat 0.4 Mg Subl (Nitroglycerin) .... Prn 14)  Gabapentin 100 Mg Caps (Gabapentin) .... 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  The patient denies chest pain and abdominal pain.    Physical Exam  General:  Very overweight-appearing.   Mouth:  Oral mucosa and oropharynx without lesions or exudates.  Teeth in good repair. Lungs:  Normal respiratory effort, chest expands symmetrically. Lungs are clear to auscultation, no crackles or wheezes. Heart:  Normal rate and regular rhythm. S1 and S2 normal without gallop, murmur, click, rub or other extra sounds. Abdomen:  Bowel sounds positive,abdomen soft and non-tender without masses, organomegaly or hernias noted. Msk:  No deformity or scoliosis noted of thoracic or lumbar spine.   Extremities:  trace left pedal edema and trace right pedal edema.   Neurologic:  No cranial nerve deficits noted. Station and gait are normal. Plantar reflexes are down-going bilaterally. DTRs are symmetrical throughout. Sensory, motor and coordinative functions appear intact. Skin:  clear Psych:  Cognition and judgment appear intact. Alert and cooperative with normal attention span and concentration. No apparent delusions, illusions, hallucinations   Impression & Recommendations:  Problem # 1:  CORONARY ARTERY DISEASE (ICD-414.00) Assessment Unchanged  His updated medication list for this problem includes:    Adult Aspirin Low Strength 81 Mg Tbdp (Aspirin) ..... Once daily    Zaroxolyn 10 Mg Tabs (Metolazone) ..... Once daily    Plavix 75 Mg Tabs (Clopidogrel bisulfate) .Marland KitchenMarland KitchenMarland KitchenMarland Kitchen  Once daily    Furosemide 80 Mg Tabs (Furosemide) ..... Once daily    Diovan 160 Mg Tabs (Valsartan) .Marland Kitchen... Take 1 tab daily    Metoprolol Tartrate 50 Mg Tabs (Metoprolol tartrate) .Marland Kitchen... 1 tab twice a day    Nitrostat 0.4 Mg Subl (Nitroglycerin) .Marland Kitchen... Prn  Problem # 2:  DIABETES MELLITUS, TYPE II (ICD-250.00) Assessment: Unchanged  His updated medication list for this problem includes:    Adult Aspirin Low Strength 81 Mg Tbdp (Aspirin) ..... Once daily    Actos 45 Mg  Tabs (Pioglitazone hcl) ..... Once daily    Humalog 100 Unit/ml Soln (Insulin lispro (human)) .Marland KitchenMarland KitchenMarland KitchenMarland Kitchen 09-21-09 and a ss    Amaryl 4 Mg Tabs (Glimepiride) .Marland Kitchen..Marland Kitchen Two times a day    Diovan 160 Mg Tabs (Valsartan) .Marland Kitchen... Take 1 tab daily  Labs Reviewed: Creat: 0.9 (01/28/2010)    Reviewed HgBA1c results: 6.8 (01/28/2010)  8.0 (04/16/2009)  Problem # 3:  HYPERTENSION (ICD-401.9) Assessment: Unchanged  His updated medication list for this problem includes:    Zaroxolyn 10 Mg Tabs (Metolazone) ..... Once daily    Furosemide 80 Mg Tabs (Furosemide) ..... Once daily    Diovan 160 Mg Tabs (Valsartan) .Marland Kitchen... Take 1 tab daily    Metoprolol Tartrate 50 Mg Tabs (Metoprolol tartrate) .Marland Kitchen... 1 tab twice a day  BP today: 144/64 Prior BP: 132/74 (04/22/2009)  Labs Reviewed: K+: 4.8 (01/28/2010) Creat: : 0.9 (01/28/2010)   Chol: 138 (04/15/2008)   HDL: 42.7 (04/15/2008)   LDL: 81 (04/15/2008)   TG: 74 (04/15/2008)  Problem # 4:  VITAMIN B12 DEFICIENCY (ICD-266.2) Assessment: Unchanged On prescription therapy   Problem # 5:  WEIGHT GAIN, ABNORMAL (ICD-783.1) Assessment: Deteriorated See "Patient Instructions".   Problem # 6:  DIZZINESS (ICD-780.4) BPV Assessment: Allayne Stack - Daroff exercise was given to the patient   Complete Medication List: 1)  Adult Aspirin Low Strength 81 Mg Tbdp (Aspirin) .... Once daily 2)  Actos 45 Mg Tabs (Pioglitazone hcl) .... Once daily 3)  Pravachol 40 Mg Tabs (Pravastatin sodium) .... Once daily 4)  Humalog 100 Unit/ml Soln (Insulin lispro (human)) .Marland Kitchen.. 09-21-09 and a ss 5)  Zaroxolyn 10 Mg Tabs (Metolazone) .... Once daily 6)  Amaryl 4 Mg Tabs (Glimepiride) .... Two times a day 7)  Plavix 75 Mg Tabs (Clopidogrel bisulfate) .... Once daily 8)  Furosemide 80 Mg Tabs (Furosemide) .... Once daily 9)  Freestyle Test Strp (Glucose blood) .... As directed 10)  Diovan 160 Mg Tabs (Valsartan) .... Take 1 tab daily 11)  Metoprolol Tartrate 50 Mg Tabs (Metoprolol  tartrate) .Marland Kitchen.. 1 tab twice a day 12)  Vitamin D3 1000 Unit Tabs (Cholecalciferol) .Marland Kitchen.. 1 by mouth daily 13)  Nitrostat 0.4 Mg Subl (Nitroglycerin) .... Prn 14)  Gabapentin 100 Mg Caps (Gabapentin) .... Two times a day  Other Orders: Admin 1st Vaccine (14782) Flu Vaccine 62yrs + [phone])  Patient Instructions: 1)  Try to eat more raw plant food, fresh and dry fruit, raw almonds, leafy vegetables, whole foods and less red meat, less animal fat. Poultry and fish is better for you than pork and beef. Avoid processed foods (canned soups, hot dogs, sausage, bacon , frozen dinners). Avoid corn syrup, high fructose syrup or aspartam and Splenda  containing drinks. Honey, Agave and Stevia are better sweeteners. Make your own  dressing with olive oil, wine vinegar, lemon juce, garlic etc. for your salads. 2)  Loose weight! 3)  Please schedule a follow-up appointment in 3 months. 4)  BMP prior to visit, ICD-9: 5)  HbgA1C prior to visit, ICD-9: 250.00 995.20 6)  Francee Piccolo - Daroff exercise was given to you - please dot    Influenza Vaccine (to be given today)   Flu Vaccine Consent Questions     Do you have a history of severe allergic reactions to this vaccine? no    Any prior history of allergic reactions to egg and/or gelatin? no    Do you have a sensitivity to the preservative Thimersol? no    Do you have a past history of Guillan-Barre Syndrome? no    Do you currently have an acute febrile illness? no    Have you ever had a severe reaction to latex? no    Vaccine information given and explained to patient? yes    Are you currently pregnant? no    Lot Number:[id number]   Exp Date:06/11/2010   Site Given  Left Deltoid IMlbflu

## 2011-01-14 NOTE — Progress Notes (Signed)
Summary: gabapen , pravastat,glimep, metopro,plavix,actos  Phone Note Other Incoming Call back at fax   Caller: [facility] pharmacy Summary of Call: pharmacy request refill on actos, pravastatin,glimepiride,gabapentin, plavix, metoprolol. Initial call taken by: Tora Perches,  January 14, 2010 4:21 PM    Prescriptions: PRAVACHOL 40 MG  TABS (PRAVASTATIN SODIUM) once daily  #90 x 2   Entered by:   Tora Perches   Authorized by:   Tresa Garter MD   Signed by:   Tora Perches on 01/14/2010   Method used:   Electronically to        [facility]* (retail)       [phone] [address] [place], [place]  [address]       Ph: [phone]       Fax: [phone]   RxID:   [id number] AMARYL 4 MG  TABS (GLIMEPIRIDE) two times a day  #180 x 2   Entered by:   Tora Perches   Authorized by:   Tresa Garter MD   Signed by:   Tora Perches on 01/14/2010   Method used:   Electronically to        [facility]* (retail)       [phone] [address] [place], [place]  [address]       Ph: [phone]       Fax: [phone]   RxID:   [id number] GABAPENTIN 100 MG CAPS (GABAPENTIN) two times a day  #180 x 2   Entered by:   Tora Perches   Authorized by:   Tresa Garter MD   Signed by:   Tora Perches on 01/14/2010   Method used:   Electronically to        [facility]* (retail)       [phone] [address] [place], [place]  [address]       Ph: [phone]       Fax: [phone]   RxID:   [id number] METOPROLOL TARTRATE 50 MG TABS (METOPROLOL TARTRATE) 1 tab twice a day  #180 x 2   Entered by:   Tora Perches   Authorized by:   Tresa Garter MD   Signed by:   Tora Perches on 01/14/2010   Method used:   Electronically to        [facility]* (retail)       [phone] [address] [place], [place]  [address]       Ph: [phone]       Fax: [phone]   RxID:   [id number] PLAVIX 75 MG  TABS (CLOPIDOGREL  BISULFATE) once daily  #90 x 2   Entered by:   Tora Perches   Authorized by:   Tresa Garter MD   Signed by:   Tora Perches on 01/14/2010   Method used:   Electronically to        [facility]* (retail)       [phone] [address] [place], [place]  [address]       Ph: [phone]       Fax: [phone]   RxID:   [id number] ACTOS 45 MG  TABS (PIOGLITAZONE HCL) once daily  #90 x 2   Entered by:   Tora Perches   Authorized by:   Tresa Garter MD  Signed by:   Tora Perches on 01/14/2010   Method used:   Electronically to        [facility]* (retail)       [phone] [address] [place], [place]  [address]       Ph: [phone]       Fax: [phone]   RxID:   [id number]

## 2011-01-14 NOTE — Assessment & Plan Note (Signed)
Summary: 3 MTH FU--STC   Vital Signs:  Patient profile:   70 year old male Height:      74 inches Weight:      385.75 pounds BMI:     49.71 O2 Sat:      98 % on Room air Temp:     97.6 degrees F oral Pulse rate:   65 / minute BP sitting:   124 / 74  (left arm) Cuff size:   large  Vitals Entered ByZella Ball Ewing (May 05, 2010 3:37 PM)  O2 Flow:  Room air CC: 3 month followup/RE   CC:  3 month followup/RE.  History of Present Illness: The patient presents for a follow up of hypertension, diabetes, hyperlipidemia C/o pain in L knee after he fell against his lawn mower  Current Medications (verified): 1)  Adult Aspirin Low Strength 81 Mg  Tbdp (Aspirin) .... Once Daily 2)  Actos 45 Mg  Tabs (Pioglitazone Hcl) .... Once Daily 3)  Pravachol 40 Mg  Tabs (Pravastatin Sodium) .... Once Daily 4)  Humalog 100 Unit/ml  Soln (Insulin Lispro (Human)) .Marland Kitchen.. 09-21-09 and A Ss 5)  Zaroxolyn 10 Mg  Tabs (Metolazone) .... Once Daily 6)  Amaryl 4 Mg  Tabs (Glimepiride) .... Two Times A Day 7)  Plavix 75 Mg  Tabs (Clopidogrel Bisulfate) .... Once Daily 8)  Furosemide 80 Mg  Tabs (Furosemide) .... Once Daily 9)  Freestyle Test   Strp (Glucose Blood) .... As Directed 10)  Diovan 160 Mg Tabs (Valsartan) .... Take 1 Tab Daily 11)  Metoprolol Tartrate 50 Mg Tabs (Metoprolol Tartrate) .Marland Kitchen.. 1 Tab Twice A Day 12)  Vitamin D3 1000 Unit  Tabs (Cholecalciferol) .Marland Kitchen.. 1 By Mouth Daily 13)  Nitrostat 0.4 Mg Subl (Nitroglycerin) .... Prn 14)  Gabapentin 100 Mg Caps (Gabapentin) .... 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    Physical Exam  General:  Very overweight-appearing.   Mouth:  Oral mucosa and oropharynx without lesions or exudates.  Teeth in good repair. Lungs:  Normal respiratory effort, chest expands symmetrically. Lungs are clear to auscultation, no crackles or wheezes. Heart:  Normal rate and regular rhythm. S1 and S2 normal without gallop, murmur, click, rub or other extra sounds. Abdomen:  Bowel sounds positive,abdomen soft and non-tender without masses, organomegaly or hernias noted. Msk:  No deformity or scoliosis noted of thoracic or lumbar spine.  L knee is tender medially Neurologic:  No cranial nerve deficits noted. Station and gait are normal. Plantar reflexes are down-going bilaterally. DTRs are symmetrical throughout. Sensory, motor and coordinative functions appear intact. Skin:  clear Psych:  Cognition and judgment appear intact. Alert and cooperative with normal attention span and concentration. No apparent delusions, illusions, hallucinations   Impression & Recommendations:  Problem # 1:  KNEE PAIN (ICD-719.46) L - MSK Assessment New Will watch  Problem # 2:  CORONARY ARTERY DISEASE (ICD-414.00) Assessment: Unchanged  His updated medication list for this problem includes:    Adult Aspirin Low Strength 81 Mg Tbdp (Aspirin) ..... Once daily    Zaroxolyn 10 Mg Tabs (Metolazone) ..... Once  daily    Plavix 75 Mg Tabs (Clopidogrel bisulfate) ..... Once daily    Furosemide 80 Mg Tabs (Furosemide) ..... Once daily    Diovan 160 Mg Tabs (Valsartan) .Marland Kitchen... Take 1 tab daily    Metoprolol Tartrate 50 Mg Tabs (Metoprolol tartrate) .Marland Kitchen... 1 tab twice a day    Nitrostat 0.4 Mg Subl (Nitroglycerin) .Marland Kitchen... Prn  Problem # 3:  VITAMIN B12 DEFICIENCY (ICD-266.2) Assessment: Unchanged On  drug  therapy   Problem # 4:  DIABETES MELLITUS, TYPE II (ICD-250.00) Assessment: Unchanged  His updated medication list for this problem  includes:    Adult Aspirin Low Strength 81 Mg Tbdp (Aspirin) ..... Once daily    Actos 45 Mg Tabs (Pioglitazone hcl) ..... Once daily    Humalog 100 Unit/ml Soln (Insulin lispro (human)) .Marland KitchenMarland KitchenMarland KitchenMarland Kitchen 09-21-09 and a ss    Amaryl 4 Mg Tabs (Glimepiride) .Marland Kitchen..Marland Kitchen Two times a day    Diovan 160 Mg Tabs (Valsartan) .Marland Kitchen... Take 1 tab daily  Complete Medication List: 1)  Adult Aspirin Low Strength 81 Mg Tbdp (Aspirin) .... Once daily 2)  Actos 45 Mg Tabs (Pioglitazone hcl) .... Once daily 3)  Pravachol 40 Mg Tabs (Pravastatin sodium) .... Once daily 4)  Humalog 100 Unit/ml Soln (Insulin lispro (human)) .Marland Kitchen.. 09-21-09 and a ss 5)  Zaroxolyn 10 Mg Tabs (Metolazone) .... Once daily 6)  Amaryl 4 Mg Tabs (Glimepiride) .... Two times a day 7)  Plavix 75 Mg Tabs (Clopidogrel bisulfate) .... Once daily 8)  Furosemide 80 Mg Tabs (Furosemide) .... Once daily 9)  Freestyle Test Strp (Glucose blood) .... As directed 10)  Diovan 160 Mg Tabs (Valsartan) .... Take 1 tab daily 11)  Metoprolol Tartrate 50 Mg Tabs (Metoprolol tartrate) .Marland Kitchen.. 1 tab twice a day 12)  Vitamin D3 1000 Unit Tabs (Cholecalciferol) .Marland Kitchen.. 1 by mouth daily 13)  Nitrostat 0.4 Mg Subl (Nitroglycerin) .... Prn 14)  Gabapentin 100 Mg Caps (Gabapentin) .... Two times a day  Patient Instructions: 1)  Please schedule a follow-up appointment in 4 months well w/labs and. 2)  BMP prior to visit, ICD-9: 3)  HbgA1C prior to visit, ICD-9: 4)  Urine Microalbumin prior to visit, ICD-9:

## 2011-01-20 NOTE — Assessment & Plan Note (Signed)
Summary: dx:cad/lg   CC:  referal from Dr. Shirl Harris..pt states he wasnt to get established with a cardiologist.  History of Present Illness: Patinet is a 70 year old who is referred for continued cardiac f/u The patient has a hsitory of CAD.  In June 2000 he suffered an MI.  Cardiac cath showed:  LAD:  30% prox; 60% then 100% distal.  LCx:  40%, 30%, 50% distal; RCA:  60% mid.  He underwent PTCA of the disal LAD (100% to 90%).    In 2001 he suffer a CVA. Over the past few years he has become less active. His weight has gone up.  He is convinced that most of his symptoms are related to his being significantly obese.   He gets tired now walking to the door.  He notes rare chest tightness, not associated with activity.   Lipid value :  LDL 77, HDL 38.  Current Medications (verified): 1)  Adult Aspirin Low Strength 81 Mg  Tbdp (Aspirin) .... Once Daily 2)  Actos 45 Mg  Tabs (Pioglitazone Hcl) .... Once Daily 3)  Pravachol 40 Mg  Tabs (Pravastatin Sodium) .... Once Daily 4)  Humalog 100 Unit/ml  Soln (Insulin Lispro (Human)) .Marland Kitchen.. 09-21-09 and A Ss 5)  Zaroxolyn 10 Mg  Tabs (Metolazone) .... Once Daily 6)  Amaryl 4 Mg  Tabs (Glimepiride) .... Two Times A Day 7)  Plavix 75 Mg  Tabs (Clopidogrel Bisulfate) .... Once Daily 8)  Furosemide 80 Mg  Tabs (Furosemide) .... Once Daily 9)  Diovan 160 Mg Tabs (Valsartan) .... Take 1 Tab Daily 10)  Metoprolol Tartrate 50 Mg Tabs (Metoprolol Tartrate) .Marland Kitchen.. 1 Tab Twice A Day 11)  Vitamin D3 1000 Unit  Tabs (Cholecalciferol) .Marland Kitchen.. 1 By Mouth Daily 12)  Nitrostat 0.4 Mg Subl (Nitroglycerin) .... Prn 13)  Gabapentin 100 Mg Caps (Gabapentin) .... Two Times A Day 14)  Vitamin B-12 1000 Mcg Tabs (Cyanocobalamin) .Marland Kitchen.. 1 Tab By Mouth Once Daily 15)  Bd [employer] 29g X 12mm Misc (Insulin Pen Needle) .... As Dirrected 16)  Freestyle Lite Test  Strp (Glucose Blood) .... 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  Neg to the above problem except as noted above.  Vital Signs:  Patient profile:   70 year old male Height:      74 inches Weight:      378 pounds BMI:     48.71 Pulse rate:   61 / minute Resp:     16 per minute BP sitting:   147 / 75  (left arm)  Vitals Entered By: Kem Parkinson (January 11, 2011 11:34 AM)  Physical Exam  Additional Exam:  patient is an obese 70 year old in NAD HEENT:  Normocephalic, atraumatic. EOMI, PERRLA.  Neck: JVP is normal. No thyromegaly. No bruits.  Lungs: clear to auscultation. No rales no wheezes.  Heart: Regular rate and rhythm. Normal S1, S2. No S3.   No significant murmurs.  Abdomen:  Supple, nontender.  Normal bowel sounds. No masses. No hepatomegaly.  Extremities:   Good distal pulses throughout. tr lower extremity edema.  Musculoskeletal :moving all extremities.  Neuro:   alert and oriented x3.    EKG  Procedure date:  01/11/2011  Findings:      NSR>  61 bpm.  Impression & Recommendations:  Problem # 1:  MYOCARDIAL INFARCTION, HX OF (ICD-412) Patient with CAD, remote intervention.   She fatigues easily but attributes it to being overweight. I encouraged him to stay active.  Try to pull wt down and follow.  If he has more SOB or things worsen would consider evaluation.  Patient is not interested now.  Problem # 2:  HYPERTENSION (ICD-401.9) Fair control. His updated medication list for this problem includes:    Adult Aspirin Low Strength  81 Mg Tbdp (Aspirin) ..... Once daily    Zaroxolyn 10 Mg Tabs (Metolazone) ..... Once daily    Furosemide 80 Mg Tabs (Furosemide) ..... Once daily    Diovan 160 Mg Tabs (Valsartan) .Marland Kitchen... Take 1 tab daily    Metoprolol Tartrate 50 Mg Tabs (Metoprolol tartrate) .Marland Kitchen... 1 tab twice a day  Problem # 3:  HYPERCHOLESTEROLEMIA, PURE (ICD-272.0) Good conrol of lipids. His updated medication list for this problem includes:    Pravachol 40 Mg Tabs (Pravastatin sodium) ..... Once daily

## 2011-02-05 ENCOUNTER — Encounter: Payer: Self-pay | Admitting: Internal Medicine

## 2011-03-02 NOTE — Consult Note (Signed)
Summary: [facility]  [facility]   Imported By: Sherian Rein 02/24/2011 09:25:55  _____________________________________________________________________  External Attachment:    Type:   Image     Comment:   External Document

## 2011-03-25 LAB — CBC
Hemoglobin: 14 g/dL (ref 13.0–17.0)
MCV: 92.4 fL (ref 78.0–100.0)
RBC: 4.49 MIL/uL (ref 4.22–5.81)
WBC: 13.5 10*3/uL — ABNORMAL HIGH (ref 4.0–10.5)

## 2011-03-25 LAB — BASIC METABOLIC PANEL
CO2: 25 mEq/L (ref 19–32)
Calcium: 9.1 mg/dL (ref 8.4–10.5)
Chloride: 101 mEq/L (ref 96–112)
GFR calc Af Amer: >60 mL/min (ref >60)
Glucose, Bld: 307 mg/dL — ABNORMAL HIGH (ref 70–99)
Potassium: 4.3 mEq/L (ref 3.5–5.1)
Sodium: 135 mEq/L (ref 135–145)

## 2011-03-25 LAB — LIPASE, BLOOD: Lipase: 73 U/L — ABNORMAL HIGH (ref 11–59)

## 2011-03-25 LAB — DIFFERENTIAL
Eosinophils Absolute: 0.1 10*3/uL (ref 0.0–0.7)
Lymphs Abs: 0.7 10*3/uL (ref 0.7–4.0)
Monocytes Absolute: 0.5 10*3/uL (ref 0.1–1.0)
Monocytes Relative: 3 % (ref 3–12)
Neutrophils Relative %: 90 % — ABNORMAL HIGH (ref 43–77)

## 2011-03-25 LAB — HEPATIC FUNCTION PANEL
AST: 17 U/L (ref 0–37)
Albumin: 3.7 g/dL (ref 3.5–5.2)
Alkaline Phosphatase: 55 U/L (ref 39–117)
Bilirubin, Direct: 0.2 mg/dL (ref 0.0–0.3)
Total Bilirubin: 0.5 mg/dL (ref 0.3–1.2)

## 2011-04-20 ENCOUNTER — Other Ambulatory Visit: Payer: Self-pay | Admitting: Internal Medicine

## 2011-04-20 ENCOUNTER — Other Ambulatory Visit: Payer: Self-pay

## 2011-04-20 DIAGNOSIS — E7800 Pure hypercholesterolemia, unspecified: Secondary | ICD-10-CM

## 2011-04-20 DIAGNOSIS — E119 Type 2 diabetes mellitus without complications: Secondary | ICD-10-CM

## 2011-04-26 ENCOUNTER — Encounter: Payer: Self-pay | Admitting: Internal Medicine

## 2011-04-27 ENCOUNTER — Ambulatory Visit: Payer: Self-pay | Admitting: Internal Medicine

## 2011-04-30 NOTE — Discharge Summary (Signed)
[name]  Patient:    Charles [name], Charles [name]                       MRN: [id number] Adm. Date:  [id number] Disc. Date: [id number] Attending:  Tresa Garter Dictator:   Cornell Barman, P.A. CC:         Sonda Primes, M.D. [facility]             C. Lesia Sago, M.D.                           Discharge Summary  DISCHARGE DIAGNOSES: 1. 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 2. Visual disturbance. 3. Hypertension.  BRIEF ADMISSION HISTORY:  The patient is a 70 year old white male who awoke on he morning of admission with clear cut visual field changes described as tunnel vision.  He denied any weakness or numbness in his face, arms or legs.  He denied any gait disorder.  He denied any change in speech.  He did complain of some headache over the left eye.  The patient was seen by Dr. ______ in the office and admitted for further evaluation.  PAST MEDICAL HISTORY: 1. History of stroke with loss of peripheral vision 4-5 years prior to this    admission. 2. Insulin-dependent diabetes mellitus. 3. Hypertension. 4. Obesity. 5. Coronary artery disease status post MI and status post angioplasty. 6. Hypercholesterolemia.  ADMISSION LABORATORY DATA:  CBC with differential was essentially normal.  Sed ate was normal.  Coagulation screen was normal.  CMET was normal except for a glucose of 179.  CT of the head revealed a subacute left occipital lobe infarct with old right occipital lobe infarct.  HOSPITAL COURSE:  NEUROLOGIC - VISUAL CHANGES WITH EVIDENCE OF NEW LEFT OCCIPITAL INFARCT:  The patient was started on Lovenox and neurology was asked to see the  patient.  A 2-D echocardiogram was ordered.  No evidence of thrombus or emboli.  The patient refused MRI and refused any sedating medications prior to an MRI. Therefore, an arteriogram was scheduled.  The patient had an arteriogram on February 08, 2000.  This revealed an  occluded occipital branch of the left RCA segment medially.  There was minimal atherosclerotic irregularity in the carotid bulb region.  In summary, the arteriogram showed cut off on the left.  Dr. Anne Hahn thought the patient was stable for discharge and recommended aspirin and Plavix at discharge.  He recommended outpatient physical therapy and occupational therapy  evaluation.  He did state that he wanted the patient to follow up with him in the office as an outpatient.  DISCHARGE MEDICATIONS:  Plavix 75 mg q.d.  Enteric coated aspirin 325 mg q.d. Lasix 40 mg q.d.  Altace 5 mg q.d.  Lopressor 50 mg b.i.d.  Pravachol 40 mg q.d. Amaryl 4 mg b.i.d.   Actos 45 mg q.d.  Humalog as he takes at home, which is 18  units, 12 units and 20 units.  FOLLOW-UP INSTRUCTIONS:  The patient will follow up with Dr. Anne Hahn in 2-3 weeks. He will follow up with Dr. Posey Rea in 10-14 days. DD:  02/09/00 TD:  02/09/00 Job: [id number]

## 2011-04-30 NOTE — Assessment & Plan Note (Signed)
[facility]                           GASTROENTEROLOGY OFFICE NOTE   NAME:Charles [name], Charles [name]                       MRN:          [id number]  DATE:08/16/2006                            DOB:          08-23-1941    REFERRING PHYSICIAN:  Georgina Quint. Plotnikov, M.D.   REASON FOR ADMISSION:  Colorectal cancer screening.   HISTORY OF PRESENT ILLNESS:  Charles [name] is a 70 year old white male referred  through the courtesy of Dr. Trinna Post Plotnikov.  He has a longstanding type 2  diabetes mellitus controlled on oral medications and Humalog.  He has end-  organ complications from diabetes including diabetic retinopathy.  He is  status post myocardial infarction with angioplasty in June of 2000, and he  has been maintained on Plavix since that time.  He has no ongoing colorectal  complaints and specifically denies change in bowel habits, change in stool  caliber, melena, hematochezia, diarrhea or constipation.  No family history  of colon cancer, colon polyps or inflammatory bowel disease.   PAST MEDICAL HISTORY:  1. Type 2 diabetes mellitus.  2. Hypertension.  3. Coronary artery disease, status post myocardial infarction.  4. Hyperlipidemia.  5. Status post cerebrovascular accident.  6. Obstructive sleep apnea.  7. Hypogonadism.  8. Diverticulosis.  9. Venous insufficiency, lower extremities.  10.Obesity.  11.Impaired vision.   CURRENT MEDICATIONS:  Listed on the maintenance medication sheet, updated  and reviewed.   MEDICATION ALLERGIES:  None known.   SOCIAL HISTORY/REVIEW OF SYSTEMS:  Per the gastroenterology evaluation form.   PHYSICAL EXAMINATION:  GENERAL:  Obese white male in acute distress.  VITAL SIGNS:  Height 6 feet 4 inches, weight 344.4 pounds, blood pressure  100/68, pulse 74 and regular.  HEENT:  Anicteric sclerae.  Oropharynx clear.  CHEST:  Clear to auscultation bilaterally.  CARDIAC:  Regular rate and rhythm without murmurs  appreciated.  ABDOMEN:  Large, soft, nontender, nondistended.  Normoactive bowel sounds.  No palpable organomegaly, masses or hernias.  RECTAL:  Examination deferred to the time of colonoscopy.  NEUROLOGIC:  Alert and oriented x3.  Grossly nonfocal.   ASSESSMENT:  1. Average risk for colorectal cancer.  2. Type 2 diabetes mellitus requiring insulin.  3. Coronary artery disease, status post myocardial infarction on Plavix.   PLAN:  Risks, benefits and alternatives to colonoscopy with possible biopsy  and possible polypectomy discussed with the patient.  He consents to  proceed.  His insulin regimen will be adjusted to allow for clear liquid  diet and NPO status on the morning of the procedure.  Plavix will be held  for 7 days prior to procedure if cleared by Dr. Posey Rea.  He may remain on  aspirin through the procedure.                                   Venita Lick. Russella Dar, MD, [facility]   MTS/MedQ  DD:  08/16/2006  DT:  08/17/2006  Job #:  [id number]

## 2011-04-30 NOTE — Consult Note (Signed)
. [facility]  Patient:    Charles [name]                       MRN: [id number] Proc. Date: 02/05/00 Adm. Date:  [id number] Attending:  Tresa Garter CC:         Angelena Sole, M.D. [facility]             [facility], [address] [place]                          Consultation Report  HISTORY OF PRESENT ILLNESS:  Charles [name] is a 70 year old right-handed white male, born [birth year], with a history of diabetes, hypertension, hypercholesterolemia and coronary artery disease.  The patient claims to have a  history of a prior stroke event with "loss of peripheral vision" that occurred our or five years ago.  Patient cannot remember the details of the event and cannot  remember which side of the vision was off and does not remember if he got everything back all the way following the event, but clearly learned to compensate for the visual fields changes.  Patient had been placed on aspirin and has done  well with this until the morning prior to this admission.  This patient awoke with onset of clear-cut visual fields changes, has what he described as "tunnel vision." Patient otherwise had no weakness or numbness in the face, arms or legs, no problems with gait, did develop a vertex headache that worsened with cough or straining.  Patient denied any change in speech.  Patient also has a little bit of a headache around the left eye.  Patient was seen by a doctor today and was brought into the hospital for an evaluation.  CT scan of the head has not yet been done. Neurology was asked to see this patient for further evaluation.  PAST MEDICAL HISTORY: 1. History of new-onset visual field deficit. 2. History of diabetes. 3. History of hypertension. 4. History of obesity. 5. History of coronary artery disease, status post angioplasty in the past and    status post MI in the past. 6. History of  hypercholesterolemia.  CURRENT MEDICATIONS: 1. Lasix 40 mg a day. 2. Altace 5 mg a day. 3. Lopressor 50 mg b.i.d. 4. Amaryl 4 mg daily. 5. Actos 45 mg a day. 6. Humalog insulin 18 units in the morning, 12 units midday and 20 units in the    evening.  ALLERGIES:  Patient has no known allergies.  SOCIAL HISTORY:  Does not smoke or drink currently.  Patient is married for the  second time; has six children who are alive and well.  Patient works as an Product manager supplies.  FAMILY HISTORY:  Notable for the fact that his mother died with cancer; father ied with multiple complications from diabetes.  Patient has three brothers with hypertension, obesity and diabetes and heart disease.  REVIEW OF SYSTEMS:  Notable for no recent fevers or chills.  Patient does note headache.  Denies any problem swallowing.  Denies any speech changes.  Denies any problems with shortness of breath, chest pain or vertigo.  Has not had any falls. Patient denies any problems controlling the bowels or the bladder.  PHYSICAL EXAMINATION:  VITAL SIGNS:  Blood pressure is 120/76.  Heart rate is 56.  Respiratory rate 20.  GENERAL:  This patient is a moderately to  markedly obese white male who is alert and cooperative at the time of examination.  HEENT:  Head is atraumatic.  Eyes:  Pupils are equal, round and reactive to light. Disks are flat bilaterally.  NECK:  Supple.  No carotid bruits noted.  No basilar bruit is noted.  LUNGS:  Patient has clear lung fields.  CARDIOVASCULAR:  Distant heart sounds.  No obvious murmurs or rubs noted.  EXTREMITIES:  Without significant edema.  NEUROLOGIC:  Cranial nerves as above.  Facial symmetry is present.  Patient has  good sensation of the face to pinprick and soft touch bilaterally.  Patient has  good strength of facial muscles and the muscles of head-turning and shoulder shrugs bilaterally.  Extraocular movements are full.  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  Patient has ood strength in all four extremities.  Good and symmetric motor tone is noted bilaterally.  Sensory testing is intact to pinprick, soft touch, vibratory sensation and position sense bilaterally.  Patient has depressed reflexes throughout.  Toes are neutral bilaterally.  Patient has good finger-to-nose-to-finger and heel-to-shin bilaterally.  Gait is unremarkable. Tandem gait unremarkable.  Romberg negative.  No evidence of pronator drift is seen.  There is a stocking-glove pinprick deficit in the lower extremities about midway up the calves bilaterally.  LABORATORY AND X-RAY FINDINGS:  CT of the head has not yet been done.  Admission blood work is pending.  IMPRESSION: 1. New onset of visual field deficit consistent with a right parietal stroke and a    left occipital stroke. 2. History of hypertension. 3. History of organic heart disease. 4. History of diabetes. 5. History of diabetic peripheral neuropathy, by examination.  This patient has a history of prior stroke event with loss of peripheral vision. It is possible that this patient may have suffered an old left occipital stroke  with a right homonymous visual field deficit and now has a new right parietal stroke with a left inferior quadrantanopsia and problems with spacial orientation and ability to understand directions.  Patient may have had embolic events referable to the posterior circulation and could potentially have vertebrobasilar insufficiency.  Deficits mainly at this point are visual in nature but clearly,  further workup is indicated.  PLAN: 1. CT scan of the head is pending. 2. Would probably go on to perform MRI and MR angiography of the vertebrobasilar    system. 3. Two-dimensional echocardiogram. 4. Plavix therapy to be added to the aspirin at this time.  Will follow patient clinically while in  house.  Patient unfortunately will not e able to drive due to the above deficits. DD:  02/05/00 TD:  02/05/00 Job: [id number]

## 2011-05-26 ENCOUNTER — Other Ambulatory Visit: Payer: Self-pay | Admitting: *Deleted

## 2011-05-26 MED ORDER — GLIMEPIRIDE 4 MG PO TABS: 4.0000 mg | ORAL_TABLET | Freq: Two times a day (BID) | ORAL | Status: DC

## 2011-05-26 MED ORDER — INSULIN LISPRO 100 UNIT/ML ~~LOC~~ SOLN: SUBCUTANEOUS | Status: DC

## 2011-05-26 MED ORDER — PRAVASTATIN SODIUM 40 MG PO TABS: 40.0000 mg | ORAL_TABLET | Freq: Every day | ORAL | Status: DC

## 2011-05-26 MED ORDER — FUROSEMIDE 80 MG PO TABS: 80.0000 mg | ORAL_TABLET | Freq: Every day | ORAL | Status: DC

## 2011-05-26 MED ORDER — METOPROLOL TARTRATE 50 MG PO TABS: 50.0000 mg | ORAL_TABLET | Freq: Two times a day (BID) | ORAL | Status: DC

## 2011-05-26 MED ORDER — VALSARTAN 160 MG PO TABS: 160.0000 mg | ORAL_TABLET | Freq: Every day | ORAL | Status: DC

## 2011-05-26 MED ORDER — INSULIN PEN NEEDLE 29G X 12MM MISC: Status: DC

## 2011-05-31 ENCOUNTER — Telehealth: Payer: Self-pay

## 2011-05-31 NOTE — Telephone Encounter (Signed)
Patient lmovm stating that his mail order company have been trying to fax request to transfer and refill his medications. Patient did not mention name of medications or pharmacy.

## 2011-06-01 MED ORDER — CLOPIDOGREL BISULFATE 75 MG PO TABS: 75.0000 mg | ORAL_TABLET | Freq: Every day | ORAL | Status: DC

## 2011-06-01 MED ORDER — PIOGLITAZONE HCL 45 MG PO TABS: 45.0000 mg | ORAL_TABLET | Freq: Every day | ORAL | Status: DC

## 2011-06-01 MED ORDER — GABAPENTIN 100 MG PO CAPS: 100.0000 mg | ORAL_CAPSULE | Freq: Two times a day (BID) | ORAL | Status: DC

## 2011-06-01 MED ORDER — GLUCOSE BLOOD VI STRP: ORAL_STRIP | Status: DC

## 2011-06-01 MED ORDER — LANCETS MISC: 1.0000 | Freq: Three times a day (TID) | Status: DC

## 2011-06-01 MED ORDER — BLOOD GLUCOSE METER KIT: PACK | Status: DC

## 2011-06-01 NOTE — Telephone Encounter (Signed)
I called pt. He needs Actos, gabapentin, a glucose meter with strips/lancets and generic plavix.

## 2011-06-02 ENCOUNTER — Other Ambulatory Visit (INDEPENDENT_AMBULATORY_CARE_PROVIDER_SITE_OTHER): Payer: Medicare Other

## 2011-06-02 DIAGNOSIS — E7800 Pure hypercholesterolemia, unspecified: Secondary | ICD-10-CM

## 2011-06-02 DIAGNOSIS — E119 Type 2 diabetes mellitus without complications: Secondary | ICD-10-CM

## 2011-06-02 LAB — BASIC METABOLIC PANEL
CO2: 31 mEq/L (ref 19–32)
Calcium: 9.1 mg/dL (ref 8.4–10.5)
Creatinine, Ser: 0.9 mg/dL (ref 0.4–1.5)
GFR: 86.5 mL/min (ref >60.00)
Glucose, Bld: 137 mg/dL — ABNORMAL HIGH (ref 70–99)
Sodium: 138 mEq/L (ref 135–145)

## 2011-06-02 LAB — LIPID PANEL
Cholesterol: 143 mg/dL (ref 0–200)
HDL: 44.4 mg/dL (ref >39.00)
Triglycerides: 48 mg/dL (ref 0.0–149.0)

## 2011-06-02 LAB — HEPATIC FUNCTION PANEL
Albumin: 3.7 g/dL (ref 3.5–5.2)
Total Protein: 6.4 g/dL (ref 6.0–8.3)

## 2011-06-02 LAB — HEMOGLOBIN A1C: Hgb A1c MFr Bld: 7 % — ABNORMAL HIGH (ref 4.6–6.5)

## 2011-06-04 ENCOUNTER — Other Ambulatory Visit: Payer: Self-pay | Admitting: *Deleted

## 2011-06-07 ENCOUNTER — Encounter: Payer: Self-pay | Admitting: Internal Medicine

## 2011-06-07 ENCOUNTER — Ambulatory Visit (INDEPENDENT_AMBULATORY_CARE_PROVIDER_SITE_OTHER): Payer: Medicare Other | Admitting: Internal Medicine

## 2011-06-07 DIAGNOSIS — E7800 Pure hypercholesterolemia, unspecified: Secondary | ICD-10-CM

## 2011-06-07 DIAGNOSIS — E119 Type 2 diabetes mellitus without complications: Secondary | ICD-10-CM

## 2011-06-07 DIAGNOSIS — R609 Edema, unspecified: Secondary | ICD-10-CM

## 2011-06-07 DIAGNOSIS — I10 Essential (primary) hypertension: Secondary | ICD-10-CM

## 2011-06-07 DIAGNOSIS — E875 Hyperkalemia: Secondary | ICD-10-CM

## 2011-06-07 DIAGNOSIS — E538 Deficiency of other specified B group vitamins: Secondary | ICD-10-CM

## 2011-06-07 MED ORDER — DOXYCYCLINE HYCLATE 100 MG PO TABS: 100.0000 mg | ORAL_TABLET | Freq: Two times a day (BID) | ORAL | Status: AC

## 2011-06-07 MED ORDER — INSULIN LISPRO 100 UNIT/ML ~~LOC~~ SOLN: SUBCUTANEOUS | Status: DC

## 2011-06-07 MED ORDER — NITROGLYCERIN 0.4 MG SL SUBL: 0.4000 mg | SUBLINGUAL_TABLET | SUBLINGUAL | Status: DC | PRN

## 2011-06-07 NOTE — Progress Notes (Signed)
  Subjective:    Patient ID: Charles [name], male    DOB: [birth year], 70 y.o.   MRN: [id number]  HPI  The patient presents for a follow-up of  chronic hypertension, chronic dyslipidemia, type 2 diabetes controlled with medicines     Review of Systems  Constitutional: Negative for appetite change, fatigue and unexpected weight change.  HENT: Positive for hearing loss (hard hearing). Negative for nosebleeds, congestion, sore throat, sneezing, trouble swallowing and neck pain.   Eyes: Positive for visual disturbance (poor vision). Negative for itching.  Respiratory: Negative for cough.   Cardiovascular: Negative for chest pain, palpitations and leg swelling.  Gastrointestinal: Negative for nausea, diarrhea, blood in stool and abdominal distention.  Genitourinary: Negative for frequency and hematuria.  Musculoskeletal: Negative for back pain, joint swelling and gait problem.  Skin: Negative for rash.  Neurological: Negative for dizziness, tremors, speech difficulty and weakness.  Psychiatric/Behavioral: Negative for sleep disturbance, dysphoric mood and agitation. The patient is not nervous/anxious.    Wt Readings from Last 3 Encounters:  06/07/11 386 lb (175.088 kg)  01/11/11 378 lb (171.46 kg)  12/24/10 392 lb (177.81 kg)       Objective:   Physical Exam  Constitutional: He is oriented to person, place, and time. He appears well-developed.       Obese  HENT:  Mouth/Throat: Oropharynx is clear and moist.       Hard hearing Poor vision  Eyes: Conjunctivae are normal. Pupils are equal, round, and reactive to light.  Neck: Normal range of motion. No JVD present. No thyromegaly present.  Cardiovascular: Normal rate, regular rhythm, normal heart sounds and intact distal pulses.  Exam reveals no gallop and no friction rub.   No murmur heard. Pulmonary/Chest: Effort normal and breath sounds normal. No respiratory distress. He has no wheezes. He has no rales. He exhibits no tenderness.    Abdominal: Soft. Bowel sounds are normal. He exhibits no distension and no mass. There is no tenderness. There is no rebound and no guarding.  Musculoskeletal: Normal range of motion. He exhibits no edema and no tenderness.  Lymphadenopathy:    He has no cervical adenopathy.  Neurological: He is alert and oriented to person, place, and time. He has normal reflexes. No cranial nerve deficit. He exhibits normal muscle tone. Coordination normal.  Skin: Skin is warm and dry. No rash noted.       SD AKs  Psychiatric: He has a normal mood and affect. His behavior is normal. Judgment and thought content normal.   Lab Results  Component Value Date   WBC 7.8 08/28/2010   HGB 13.2 08/28/2010   HCT 38.8* 08/28/2010   PLT 283.0 08/28/2010   CHOL 143 06/02/2011   TRIG 48.0 06/02/2011   HDL 44.40 06/02/2011   ALT 16 06/02/2011   AST 24 06/02/2011   NA 138 06/02/2011   K 5.8* 06/02/2011   CL 102 06/02/2011   CREATININE 0.9 06/02/2011   BUN 20 06/02/2011   CO2 31 06/02/2011   TSH 1.61 08/28/2010   PSA 0.96 08/28/2010   HGBA1C 7.0* 06/02/2011   MICROALBUR 1.4 08/28/2010           Assessment & Plan:  States BP is good at home

## 2011-06-07 NOTE — Assessment & Plan Note (Addendum)
Recheck BMET in 1 mo D/c KCl

## 2011-06-07 NOTE — Assessment & Plan Note (Signed)
See Meds 

## 2011-06-07 NOTE — Assessment & Plan Note (Signed)
On Rx 

## 2011-06-07 NOTE — Assessment & Plan Note (Signed)
On rx 

## 2011-06-07 NOTE — Assessment & Plan Note (Signed)
On Humalog pen 20-20-20

## 2011-07-19 ENCOUNTER — Other Ambulatory Visit (INDEPENDENT_AMBULATORY_CARE_PROVIDER_SITE_OTHER): Payer: Medicare Other

## 2011-07-19 DIAGNOSIS — E875 Hyperkalemia: Secondary | ICD-10-CM

## 2011-07-19 LAB — BASIC METABOLIC PANEL
Calcium: 8.8 mg/dL (ref 8.4–10.5)
Creatinine, Ser: 0.8 mg/dL (ref 0.4–1.5)

## 2011-09-07 ENCOUNTER — Ambulatory Visit: Payer: Medicare Other | Admitting: Internal Medicine

## 2011-10-08 ENCOUNTER — Other Ambulatory Visit: Payer: Self-pay | Admitting: *Deleted

## 2011-10-08 NOTE — Telephone Encounter (Signed)
rec fax requesting to change Diovan 160 mg to Losartan to save pt $. If okay, please advise which strength/sig. Thanks

## 2011-10-11 NOTE — Telephone Encounter (Signed)
OK 100 mg Thx

## 2011-10-12 MED ORDER — LOSARTAN POTASSIUM 100 MG PO TABS: 100.0000 mg | ORAL_TABLET | Freq: Every day | ORAL | Status: DC

## 2011-10-12 NOTE — Telephone Encounter (Signed)
Rf sent

## 2011-10-25 ENCOUNTER — Ambulatory Visit: Payer: Medicare Other | Admitting: Internal Medicine

## 2011-10-28 ENCOUNTER — Other Ambulatory Visit (INDEPENDENT_AMBULATORY_CARE_PROVIDER_SITE_OTHER): Payer: Medicare Other

## 2011-10-28 DIAGNOSIS — E875 Hyperkalemia: Secondary | ICD-10-CM

## 2011-10-28 DIAGNOSIS — I10 Essential (primary) hypertension: Secondary | ICD-10-CM

## 2011-10-28 DIAGNOSIS — E119 Type 2 diabetes mellitus without complications: Secondary | ICD-10-CM

## 2011-10-28 DIAGNOSIS — E538 Deficiency of other specified B group vitamins: Secondary | ICD-10-CM

## 2011-10-28 DIAGNOSIS — R609 Edema, unspecified: Secondary | ICD-10-CM

## 2011-10-28 DIAGNOSIS — E7800 Pure hypercholesterolemia, unspecified: Secondary | ICD-10-CM

## 2011-10-28 LAB — COMPREHENSIVE METABOLIC PANEL
AST: 19 U/L (ref 0–37)
Albumin: 3.4 g/dL — ABNORMAL LOW (ref 3.5–5.2)
Alkaline Phosphatase: 56 U/L (ref 39–117)
BUN: 14 mg/dL (ref 6–23)
Potassium: 4.4 mEq/L (ref 3.5–5.1)
Sodium: 139 mEq/L (ref 135–145)
Total Protein: 6.5 g/dL (ref 6.0–8.3)

## 2011-11-03 ENCOUNTER — Encounter: Payer: Self-pay | Admitting: Internal Medicine

## 2011-11-03 ENCOUNTER — Ambulatory Visit (INDEPENDENT_AMBULATORY_CARE_PROVIDER_SITE_OTHER): Payer: Medicare Other | Admitting: Internal Medicine

## 2011-11-03 VITALS — BP 124/84 | HR 77 | Temp 98.4°F | Resp 16 | Wt 381.5 lb

## 2011-11-03 DIAGNOSIS — Z23 Encounter for immunization: Secondary | ICD-10-CM

## 2011-11-03 DIAGNOSIS — E119 Type 2 diabetes mellitus without complications: Secondary | ICD-10-CM

## 2011-11-03 DIAGNOSIS — R609 Edema, unspecified: Secondary | ICD-10-CM

## 2011-11-03 DIAGNOSIS — E875 Hyperkalemia: Secondary | ICD-10-CM

## 2011-11-03 DIAGNOSIS — E538 Deficiency of other specified B group vitamins: Secondary | ICD-10-CM

## 2011-11-03 DIAGNOSIS — E1139 Type 2 diabetes mellitus with other diabetic ophthalmic complication: Secondary | ICD-10-CM

## 2011-11-03 MED ORDER — LOSARTAN POTASSIUM 100 MG PO TABS: 100.0000 mg | ORAL_TABLET | Freq: Every day | ORAL | Status: DC

## 2011-11-03 NOTE — Assessment & Plan Note (Signed)
Continue with current prescription therapy as reflected on the Med list.  

## 2011-11-03 NOTE — Patient Instructions (Signed)
Ok to taper off and stop Actos

## 2011-11-03 NOTE — Assessment & Plan Note (Signed)
Resolved  ? Artifact

## 2011-11-03 NOTE — Assessment & Plan Note (Signed)
Better  

## 2011-11-03 NOTE — Progress Notes (Signed)
  Subjective:    Patient ID: Charles [name], male    DOB: 09-23-1941, 70 y.o.   MRN: [id number]  HPI  The patient presents for a follow-up of  chronic hypertension, chronic dyslipidemia, type 2 diabetes controlled with medicines    Review of Systems  Constitutional: Negative for appetite change, fatigue and unexpected weight change.  HENT: Negative for nosebleeds, congestion, sore throat, sneezing, trouble swallowing and neck pain.   Eyes: Negative for itching and visual disturbance.  Respiratory: Negative for cough.   Cardiovascular: Negative for chest pain, palpitations and leg swelling.  Gastrointestinal: Negative for nausea, diarrhea, blood in stool and abdominal distention.  Genitourinary: Negative for frequency and hematuria.  Musculoskeletal: Negative for back pain, joint swelling and gait problem.  Skin: Negative for rash.  Neurological: Negative for dizziness, tremors, speech difficulty and weakness.  Psychiatric/Behavioral: Negative for sleep disturbance, dysphoric mood and agitation. The patient is not nervous/anxious.        Objective:   Physical Exam  Constitutional: He is oriented to person, place, and time. He appears well-developed.       obese  HENT:  Mouth/Throat: Oropharynx is clear and moist.       Hard hearing  Eyes: Conjunctivae are normal. Pupils are equal, round, and reactive to light.  Neck: Normal range of motion. No JVD present. No thyromegaly present.  Cardiovascular: Normal rate, regular rhythm, normal heart sounds and intact distal pulses.  Exam reveals no gallop and no friction rub.   No murmur heard. Pulmonary/Chest: Effort normal and breath sounds normal. No respiratory distress. He has no wheezes. He has no rales. He exhibits no tenderness.  Abdominal: Soft. Bowel sounds are normal. He exhibits no distension and no mass. There is no tenderness. There is no rebound and no guarding.  Musculoskeletal: Normal range of motion. He exhibits edema (trace  B). He exhibits no tenderness.  Lymphadenopathy:    He has no cervical adenopathy.  Neurological: He is alert and oriented to person, place, and time. He has normal reflexes. No cranial nerve deficit. He exhibits normal muscle tone. Coordination normal.  Skin: Skin is warm and dry. No rash noted.  Psychiatric: He has a normal mood and affect. His behavior is normal. Judgment and thought content normal.    Lab Results  Component Value Date   WBC 7.8 08/28/2010   HGB 13.2 08/28/2010   HCT 38.8* 08/28/2010   PLT 283.0 08/28/2010   GLUCOSE 170* 10/28/2011   CHOL 143 06/02/2011   TRIG 48.0 06/02/2011   HDL 44.40 06/02/2011   LDLCALC 89 06/02/2011   ALT 18 10/28/2011   AST 19 10/28/2011   NA 139 10/28/2011   K 4.4 10/28/2011   CL 102 10/28/2011   CREATININE 0.9 10/28/2011   BUN 14 10/28/2011   CO2 32 10/28/2011   TSH 1.61 08/28/2010   PSA 0.96 08/28/2010   HGBA1C 6.7* 10/28/2011   MICROALBUR 1.4 08/28/2010         Assessment & Plan:

## 2011-12-15 ENCOUNTER — Other Ambulatory Visit: Payer: Self-pay | Admitting: Internal Medicine

## 2011-12-24 DIAGNOSIS — L97509 Non-pressure chronic ulcer of other part of unspecified foot with unspecified severity: Secondary | ICD-10-CM | POA: Diagnosis not present

## 2012-01-12 DIAGNOSIS — L97509 Non-pressure chronic ulcer of other part of unspecified foot with unspecified severity: Secondary | ICD-10-CM | POA: Diagnosis not present

## 2012-02-02 ENCOUNTER — Other Ambulatory Visit (INDEPENDENT_AMBULATORY_CARE_PROVIDER_SITE_OTHER): Payer: Medicare Other

## 2012-02-02 DIAGNOSIS — E119 Type 2 diabetes mellitus without complications: Secondary | ICD-10-CM | POA: Diagnosis not present

## 2012-02-02 LAB — BASIC METABOLIC PANEL
BUN: 10 mg/dL (ref 6–23)
CO2: 30 mEq/L (ref 19–32)
Calcium: 8.7 mg/dL (ref 8.4–10.5)
Chloride: 103 mEq/L (ref 96–112)
Creatinine, Ser: 0.9 mg/dL (ref 0.4–1.5)
Glucose, Bld: 149 mg/dL — ABNORMAL HIGH (ref 70–99)

## 2012-02-02 LAB — HEMOGLOBIN A1C: Hgb A1c MFr Bld: 7.5 % — ABNORMAL HIGH (ref 4.6–6.5)

## 2012-02-09 ENCOUNTER — Ambulatory Visit (INDEPENDENT_AMBULATORY_CARE_PROVIDER_SITE_OTHER): Payer: Medicare Other | Admitting: Internal Medicine

## 2012-02-09 ENCOUNTER — Encounter: Payer: Self-pay | Admitting: Internal Medicine

## 2012-02-09 VITALS — BP 148/90 | HR 84 | Temp 98.5°F | Resp 16

## 2012-02-09 DIAGNOSIS — R609 Edema, unspecified: Secondary | ICD-10-CM | POA: Diagnosis not present

## 2012-02-09 DIAGNOSIS — G909 Disorder of the autonomic nervous system, unspecified: Secondary | ICD-10-CM

## 2012-02-09 DIAGNOSIS — E119 Type 2 diabetes mellitus without complications: Secondary | ICD-10-CM | POA: Diagnosis not present

## 2012-02-09 DIAGNOSIS — E1139 Type 2 diabetes mellitus with other diabetic ophthalmic complication: Secondary | ICD-10-CM

## 2012-02-09 DIAGNOSIS — H579 Unspecified disorder of eye and adnexa: Secondary | ICD-10-CM

## 2012-02-09 DIAGNOSIS — IMO0002 Reserved for concepts with insufficient information to code with codable children: Secondary | ICD-10-CM | POA: Insufficient documentation

## 2012-02-09 DIAGNOSIS — I10 Essential (primary) hypertension: Secondary | ICD-10-CM | POA: Diagnosis not present

## 2012-02-09 DIAGNOSIS — E1142 Type 2 diabetes mellitus with diabetic polyneuropathy: Secondary | ICD-10-CM | POA: Insufficient documentation

## 2012-02-09 DIAGNOSIS — E1149 Type 2 diabetes mellitus with other diabetic neurological complication: Secondary | ICD-10-CM

## 2012-02-09 DIAGNOSIS — E11319 Type 2 diabetes mellitus with unspecified diabetic retinopathy without macular edema: Secondary | ICD-10-CM

## 2012-02-09 NOTE — Assessment & Plan Note (Signed)
2/13 worse - he has cut back on Actos and Insulin - discussed

## 2012-02-09 NOTE — Assessment & Plan Note (Signed)
Continue with current prescription therapy as reflected on the Med list.  

## 2012-02-09 NOTE — Assessment & Plan Note (Signed)
He wants to do better w/diet and exercise

## 2012-02-09 NOTE — Progress Notes (Signed)
Patient ID: Charles [name], male   DOB: 25-Oct-1941, 71 y.o.   MRN: [id number]  Subjective:    Patient ID: Charles [name], male    DOB: September 29, 1941, 71 y.o.   MRN: [id number]  HPI  The patient presents for a follow-up of  chronic hypertension, chronic dyslipidemia, type 2 diabetes controlled with medicines  BP Readings from Last 3 Encounters:  02/09/12 148/90  11/03/11 124/84  06/07/11 170/76   Wt Readings from Last 3 Encounters:  11/03/11 381 lb 8 oz (173.047 kg)  06/07/11 386 lb (175.088 kg)  01/11/11 378 lb (171.46 kg)       Review of Systems  Constitutional: Negative for appetite change, fatigue and unexpected weight change.  HENT: Negative for nosebleeds, congestion, sore throat, sneezing, trouble swallowing and neck pain.   Eyes: Negative for itching and visual disturbance.  Respiratory: Negative for cough.   Cardiovascular: Negative for chest pain, palpitations and leg swelling.  Gastrointestinal: Negative for nausea, diarrhea, blood in stool and abdominal distention.  Genitourinary: Negative for frequency and hematuria.  Musculoskeletal: Negative for back pain, joint swelling and gait problem.  Skin: Negative for rash.  Neurological: Negative for dizziness, tremors, speech difficulty and weakness.  Psychiatric/Behavioral: Negative for sleep disturbance, dysphoric mood and agitation. The patient is not nervous/anxious.        Objective:   Physical Exam  Constitutional: He is oriented to person, place, and time. He appears well-developed.       obese  HENT:  Mouth/Throat: Oropharynx is clear and moist.       Hard hearing  Eyes: Conjunctivae are normal. Pupils are equal, round, and reactive to light.  Neck: Normal range of motion. No JVD present. No thyromegaly present.  Cardiovascular: Normal rate, regular rhythm, normal heart sounds and intact distal pulses.  Exam reveals no gallop and no friction rub.   No murmur heard. Pulmonary/Chest: Effort normal and breath  sounds normal. No respiratory distress. He has no wheezes. He has no rales. He exhibits no tenderness.  Abdominal: Soft. Bowel sounds are normal. He exhibits no distension and no mass. There is no tenderness. There is no rebound and no guarding.  Musculoskeletal: Normal range of motion. He exhibits edema (trace B). He exhibits no tenderness.  Lymphadenopathy:    He has no cervical adenopathy.  Neurological: He is alert and oriented to person, place, and time. He has normal reflexes. No cranial nerve deficit. He exhibits normal muscle tone. Coordination normal.  Skin: Skin is warm and dry. No rash noted.  Psychiatric: He has a normal mood and affect. His behavior is normal. Judgment and thought content normal.    Lab Results  Component Value Date   WBC 7.8 08/28/2010   HGB 13.2 08/28/2010   HCT 38.8* 08/28/2010   PLT 283.0 08/28/2010   GLUCOSE 149* 02/02/2012   CHOL 143 06/02/2011   TRIG 48.0 06/02/2011   HDL 44.40 06/02/2011   LDLCALC 89 06/02/2011   ALT 18 10/28/2011   AST 19 10/28/2011   NA 139 02/02/2012   K 4.6 02/02/2012   CL 103 02/02/2012   CREATININE 0.9 02/02/2012   BUN 10 02/02/2012   CO2 30 02/02/2012   TSH 1.61 08/28/2010   PSA 0.96 08/28/2010   HGBA1C 7.5* 02/02/2012   MICROALBUR 1.4 08/28/2010         Assessment & Plan:

## 2012-02-16 DIAGNOSIS — L97509 Non-pressure chronic ulcer of other part of unspecified foot with unspecified severity: Secondary | ICD-10-CM | POA: Diagnosis not present

## 2012-02-22 ENCOUNTER — Telehealth: Payer: Self-pay | Admitting: Internal Medicine

## 2012-02-22 MED ORDER — PIOGLITAZONE HCL 45 MG PO TABS: 45.0000 mg | ORAL_TABLET | Freq: Every day | ORAL | Status: DC

## 2012-02-22 NOTE — Telephone Encounter (Signed)
Done. Pt informed.

## 2012-02-22 NOTE — Telephone Encounter (Signed)
Pt requesting generic actos  -[facility]--pt [phone]

## 2012-02-24 ENCOUNTER — Other Ambulatory Visit: Payer: Self-pay | Admitting: Internal Medicine

## 2012-03-16 ENCOUNTER — Other Ambulatory Visit: Payer: Self-pay | Admitting: Internal Medicine

## 2012-05-16 ENCOUNTER — Other Ambulatory Visit (INDEPENDENT_AMBULATORY_CARE_PROVIDER_SITE_OTHER): Payer: Medicare Other

## 2012-05-16 DIAGNOSIS — G909 Disorder of the autonomic nervous system, unspecified: Secondary | ICD-10-CM

## 2012-05-16 DIAGNOSIS — H579 Unspecified disorder of eye and adnexa: Secondary | ICD-10-CM

## 2012-05-16 DIAGNOSIS — E1142 Type 2 diabetes mellitus with diabetic polyneuropathy: Secondary | ICD-10-CM

## 2012-05-16 DIAGNOSIS — E1149 Type 2 diabetes mellitus with other diabetic neurological complication: Secondary | ICD-10-CM

## 2012-05-16 DIAGNOSIS — R609 Edema, unspecified: Secondary | ICD-10-CM | POA: Diagnosis not present

## 2012-05-16 DIAGNOSIS — E119 Type 2 diabetes mellitus without complications: Secondary | ICD-10-CM

## 2012-05-16 DIAGNOSIS — E11319 Type 2 diabetes mellitus with unspecified diabetic retinopathy without macular edema: Secondary | ICD-10-CM

## 2012-05-16 DIAGNOSIS — I10 Essential (primary) hypertension: Secondary | ICD-10-CM | POA: Diagnosis not present

## 2012-05-16 DIAGNOSIS — E1139 Type 2 diabetes mellitus with other diabetic ophthalmic complication: Secondary | ICD-10-CM | POA: Diagnosis not present

## 2012-05-16 LAB — BASIC METABOLIC PANEL
Chloride: 104 mEq/L (ref 96–112)
GFR: 109.2 mL/min (ref >60.00)
Glucose, Bld: 121 mg/dL — ABNORMAL HIGH (ref 70–99)
Potassium: 5 mEq/L (ref 3.5–5.1)
Sodium: 141 mEq/L (ref 135–145)

## 2012-05-17 ENCOUNTER — Ambulatory Visit: Payer: Medicare Other | Admitting: Internal Medicine

## 2012-05-17 DIAGNOSIS — L608 Other nail disorders: Secondary | ICD-10-CM | POA: Diagnosis not present

## 2012-05-17 DIAGNOSIS — E1049 Type 1 diabetes mellitus with other diabetic neurological complication: Secondary | ICD-10-CM | POA: Diagnosis not present

## 2012-05-17 DIAGNOSIS — Q828 Other specified congenital malformations of skin: Secondary | ICD-10-CM | POA: Diagnosis not present

## 2012-05-24 ENCOUNTER — Encounter: Payer: Self-pay | Admitting: Internal Medicine

## 2012-05-24 ENCOUNTER — Ambulatory Visit (INDEPENDENT_AMBULATORY_CARE_PROVIDER_SITE_OTHER): Payer: Medicare Other | Admitting: Internal Medicine

## 2012-05-24 VITALS — BP 148/78 | HR 80 | Temp 98.2°F | Resp 16 | Wt 385.0 lb

## 2012-05-24 DIAGNOSIS — R609 Edema, unspecified: Secondary | ICD-10-CM

## 2012-05-24 DIAGNOSIS — E1142 Type 2 diabetes mellitus with diabetic polyneuropathy: Secondary | ICD-10-CM | POA: Diagnosis not present

## 2012-05-24 DIAGNOSIS — E1149 Type 2 diabetes mellitus with other diabetic neurological complication: Secondary | ICD-10-CM | POA: Diagnosis not present

## 2012-05-24 DIAGNOSIS — E538 Deficiency of other specified B group vitamins: Secondary | ICD-10-CM

## 2012-05-24 DIAGNOSIS — I10 Essential (primary) hypertension: Secondary | ICD-10-CM

## 2012-05-24 DIAGNOSIS — E119 Type 2 diabetes mellitus without complications: Secondary | ICD-10-CM | POA: Diagnosis not present

## 2012-05-24 MED ORDER — GABAPENTIN 300 MG PO CAPS: 300.0000 mg | ORAL_CAPSULE | Freq: Three times a day (TID) | ORAL | Status: DC | PRN

## 2012-05-24 NOTE — Progress Notes (Signed)
  Subjective:    Patient ID: Charles [name], male    DOB: [birth year], 71 y.o.   MRN: [id number]  HPI  The patient presents for a follow-up of  chronic hypertension, chronic dyslipidemia, type 2 diabetes controlled with medicines. F/u neuropathy  BP Readings from Last 3 Encounters:  05/24/12 148/78  02/09/12 148/90  11/03/11 124/84   Wt Readings from Last 3 Encounters:  05/24/12 385 lb (174.635 kg)  11/03/11 381 lb 8 oz (173.047 kg)  06/07/11 386 lb (175.088 kg)       Review of Systems  Constitutional: Negative for appetite change, fatigue and unexpected weight change.  HENT: Negative for nosebleeds, congestion, sore throat, sneezing, trouble swallowing and neck pain.   Eyes: Negative for itching and visual disturbance.  Respiratory: Negative for cough.   Cardiovascular: Negative for chest pain, palpitations and leg swelling.  Gastrointestinal: Negative for nausea, diarrhea, blood in stool and abdominal distention.  Genitourinary: Negative for frequency and hematuria.  Musculoskeletal: Negative for back pain, joint swelling and gait problem.  Skin: Negative for rash.  Neurological: Negative for dizziness, tremors, speech difficulty and weakness.  Psychiatric/Behavioral: Negative for disturbed wake/sleep cycle, dysphoric mood and agitation. The patient is not nervous/anxious.        Objective:   Physical Exam  Constitutional: He is oriented to person, place, and time. He appears well-developed.       obese  HENT:  Mouth/Throat: Oropharynx is clear and moist.       Hard hearing  Eyes: Conjunctivae are normal. Pupils are equal, round, and reactive to light.  Neck: Normal range of motion. No JVD present. No thyromegaly present.  Cardiovascular: Normal rate, regular rhythm, normal heart sounds and intact distal pulses.  Exam reveals no gallop and no friction rub.   No murmur heard. Pulmonary/Chest: Effort normal and breath sounds normal. No respiratory distress. He has no  wheezes. He has no rales. He exhibits no tenderness.  Abdominal: Soft. Bowel sounds are normal. He exhibits no distension and no mass. There is no tenderness. There is no rebound and no guarding.  Musculoskeletal: Normal range of motion. He exhibits edema (trace to 1+ B ankles). He exhibits no tenderness.  Lymphadenopathy:    He has no cervical adenopathy.  Neurological: He is alert and oriented to person, place, and time. He has normal reflexes. No cranial nerve deficit. He exhibits normal muscle tone. Coordination normal.  Skin: Skin is warm and dry. No rash noted.  Psychiatric: He has a normal mood and affect. His behavior is normal. Judgment and thought content normal.    Lab Results  Component Value Date   WBC 7.8 08/28/2010   HGB 13.2 08/28/2010   HCT 38.8* 08/28/2010   PLT 283.0 08/28/2010   GLUCOSE 121* 05/16/2012   CHOL 143 06/02/2011   TRIG 48.0 06/02/2011   HDL 44.40 06/02/2011   LDLCALC 89 06/02/2011   ALT 18 10/28/2011   AST 19 10/28/2011   NA 141 05/16/2012   K 5.0 05/16/2012   CL 104 05/16/2012   CREATININE 0.8 05/16/2012   BUN 13 05/16/2012   CO2 31 05/16/2012   TSH 1.61 08/28/2010   PSA 0.96 08/28/2010   HGBA1C 6.5 05/16/2012   MICROALBUR 1.4 08/28/2010         Assessment & Plan:

## 2012-05-24 NOTE — Assessment & Plan Note (Signed)
We increased gabapentin

## 2012-05-24 NOTE — Assessment & Plan Note (Signed)
Better Continue with current prescription therapy as reflected on the Med list.  

## 2012-05-24 NOTE — Assessment & Plan Note (Signed)
Chronic and refractory. Lap Band was suggested

## 2012-05-24 NOTE — Assessment & Plan Note (Signed)
Continue with current prescription therapy as reflected on the Med list.  

## 2012-05-24 NOTE — Assessment & Plan Note (Signed)
Monitoring

## 2012-05-24 NOTE — Patient Instructions (Signed)
[email or web address] 

## 2012-05-29 ENCOUNTER — Other Ambulatory Visit: Payer: Self-pay | Admitting: Internal Medicine

## 2012-08-17 ENCOUNTER — Other Ambulatory Visit: Payer: Self-pay | Admitting: *Deleted

## 2012-08-17 MED ORDER — LOSARTAN POTASSIUM 100 MG PO TABS: 100.0000 mg | ORAL_TABLET | Freq: Every day | ORAL | Status: DC

## 2012-10-11 DIAGNOSIS — L608 Other nail disorders: Secondary | ICD-10-CM | POA: Diagnosis not present

## 2012-10-11 DIAGNOSIS — E1049 Type 1 diabetes mellitus with other diabetic neurological complication: Secondary | ICD-10-CM | POA: Diagnosis not present

## 2012-10-26 ENCOUNTER — Emergency Department (HOSPITAL_COMMUNITY): Payer: Medicare Other

## 2012-10-26 ENCOUNTER — Encounter (HOSPITAL_COMMUNITY): Payer: Self-pay | Admitting: Emergency Medicine

## 2012-10-26 ENCOUNTER — Inpatient Hospital Stay (HOSPITAL_COMMUNITY)
Admission: EM | Admit: 2012-10-26 | Discharge: 2012-10-28 | DRG: 281 | Disposition: A | Discharge status: Home / Self Care | Payer: Medicare Other | Attending: Cardiology | Admitting: Cardiology

## 2012-10-26 DIAGNOSIS — I214 Non-ST elevation (NSTEMI) myocardial infarction: Secondary | ICD-10-CM | POA: Diagnosis not present

## 2012-10-26 DIAGNOSIS — Z8673 Personal history of transient ischemic attack (TIA), and cerebral infarction without residual deficits: Secondary | ICD-10-CM

## 2012-10-26 DIAGNOSIS — R079 Chest pain, unspecified: Secondary | ICD-10-CM | POA: Diagnosis not present

## 2012-10-26 DIAGNOSIS — E1142 Type 2 diabetes mellitus with diabetic polyneuropathy: Secondary | ICD-10-CM | POA: Diagnosis present

## 2012-10-26 DIAGNOSIS — E669 Obesity, unspecified: Secondary | ICD-10-CM | POA: Diagnosis present

## 2012-10-26 DIAGNOSIS — I4891 Unspecified atrial fibrillation: Secondary | ICD-10-CM

## 2012-10-26 DIAGNOSIS — E876 Hypokalemia: Secondary | ICD-10-CM | POA: Diagnosis present

## 2012-10-26 DIAGNOSIS — E1149 Type 2 diabetes mellitus with other diabetic neurological complication: Secondary | ICD-10-CM | POA: Diagnosis present

## 2012-10-26 DIAGNOSIS — I2510 Atherosclerotic heart disease of native coronary artery without angina pectoris: Secondary | ICD-10-CM | POA: Diagnosis present

## 2012-10-26 DIAGNOSIS — I10 Essential (primary) hypertension: Secondary | ICD-10-CM | POA: Diagnosis present

## 2012-10-26 DIAGNOSIS — Z794 Long term (current) use of insulin: Secondary | ICD-10-CM

## 2012-10-26 DIAGNOSIS — G4733 Obstructive sleep apnea (adult) (pediatric): Secondary | ICD-10-CM | POA: Diagnosis present

## 2012-10-26 DIAGNOSIS — R002 Palpitations: Secondary | ICD-10-CM

## 2012-10-26 DIAGNOSIS — I252 Old myocardial infarction: Secondary | ICD-10-CM

## 2012-10-26 DIAGNOSIS — Z6841 Body Mass Index (BMI) 40.0 and over, adult: Secondary | ICD-10-CM | POA: Diagnosis not present

## 2012-10-26 DIAGNOSIS — H543 Unqualified visual loss, both eyes: Secondary | ICD-10-CM | POA: Diagnosis present

## 2012-10-26 HISTORY — DX: Personal history of other diseases of the digestive system: Z87.19

## 2012-10-26 HISTORY — DX: Deficiency of other specified B group vitamins: E53.8

## 2012-10-26 HISTORY — DX: Type 2 diabetes mellitus without complications: E11.9

## 2012-10-26 HISTORY — DX: Calculus of gallbladder without cholecystitis without obstruction: K80.20

## 2012-10-26 HISTORY — DX: Personal history of peptic ulcer disease: Z87.11

## 2012-10-26 HISTORY — DX: Generalized contraction of visual field, unspecified eye: H53.489

## 2012-10-26 HISTORY — DX: Pure hypercholesterolemia, unspecified: E78.00

## 2012-10-26 HISTORY — DX: Claustrophobia: F40.240

## 2012-10-26 HISTORY — DX: Obstructive sleep apnea (adult) (pediatric): G47.33

## 2012-10-26 HISTORY — DX: Type 2 diabetes mellitus with diabetic neuropathy, unspecified: E11.40

## 2012-10-26 HISTORY — DX: Male erectile dysfunction, unspecified: N52.9

## 2012-10-26 HISTORY — DX: Dependence on other enabling machines and devices: Z99.89

## 2012-10-26 HISTORY — DX: Personal history of Methicillin resistant Staphylococcus aureus infection: Z86.14

## 2012-10-26 LAB — BASIC METABOLIC PANEL
Chloride: 96 mEq/L (ref 96–112)
GFR calc Af Amer: >90 mL/min (ref >90)
GFR calc non Af Amer: 90 mL/min — ABNORMAL LOW (ref >90)
Potassium: 3.8 mEq/L (ref 3.5–5.1)

## 2012-10-26 LAB — TROPONIN I
Troponin I: 0.37 ng/mL (ref <0.30)
Troponin I: 1.67 ng/mL (ref <0.30)

## 2012-10-26 LAB — POCT I-STAT TROPONIN I: Troponin i, poc: 0.13 ng/mL (ref 0.00–0.08)

## 2012-10-26 LAB — CBC WITH DIFFERENTIAL/PLATELET
Basophils Absolute: 0 10*3/uL (ref 0.0–0.1)
Basophils Relative: 0 % (ref 0–1)
Eosinophils Absolute: 0 10*3/uL (ref 0.0–0.7)
MCHC: 32.6 g/dL (ref 30.0–36.0)
Neutro Abs: 8.8 10*3/uL — ABNORMAL HIGH (ref 1.7–7.7)
Neutrophils Relative %: 87 % — ABNORMAL HIGH (ref 43–77)
Platelets: 257 10*3/uL (ref 150–400)
RDW: 14.9 % (ref 11.5–15.5)

## 2012-10-26 LAB — MRSA PCR SCREENING: MRSA by PCR: NEGATIVE

## 2012-10-26 LAB — GLUCOSE, CAPILLARY: Glucose-Capillary: 177 mg/dL — ABNORMAL HIGH (ref 70–99)

## 2012-10-26 LAB — PLATELET INHIBITION P2Y12: Platelet Function  P2Y12: 256 [PRU] (ref 194–418)

## 2012-10-26 MED ORDER — HEPARIN (PORCINE) IN NACL 100-0.45 UNIT/ML-% IJ SOLN
2000.0000 [IU]/h | INTRAMUSCULAR | Status: DC
  Administered 2012-10-26: 1850 [IU]/h via INTRAVENOUS
  Filled 2012-10-26 (×6): qty 250

## 2012-10-26 MED ORDER — SODIUM CHLORIDE 0.9 % IV SOLN: 250.0000 mL | INTRAVENOUS | Status: DC | PRN

## 2012-10-26 MED ORDER — HEPARIN BOLUS VIA INFUSION
4000.0000 [IU] | Freq: Once | INTRAVENOUS | Status: AC
  Administered 2012-10-26: 4000 [IU] via INTRAVENOUS

## 2012-10-26 MED ORDER — SODIUM CHLORIDE 0.9 % IV SOLN: 1.0000 mL/kg/h | INTRAVENOUS | Status: DC

## 2012-10-26 MED ORDER — SODIUM CHLORIDE 0.9 % IJ SOLN
3.0000 mL | Freq: Two times a day (BID) | INTRAMUSCULAR | Status: DC
  Administered 2012-10-26: 3 mL via INTRAVENOUS

## 2012-10-26 MED ORDER — GLIMEPIRIDE 4 MG PO TABS
4.0000 mg | ORAL_TABLET | Freq: Two times a day (BID) | ORAL | Status: DC
  Administered 2012-10-27 – 2012-10-28 (×2): 4 mg via ORAL
  Filled 2012-10-26 (×6): qty 1

## 2012-10-26 MED ORDER — SIMVASTATIN 20 MG PO TABS
20.0000 mg | ORAL_TABLET | Freq: Every day | ORAL | Status: DC
  Administered 2012-10-27: 20 mg via ORAL
  Filled 2012-10-26 (×4): qty 1

## 2012-10-26 MED ORDER — ASPIRIN 81 MG PO CHEW
324.0000 mg | CHEWABLE_TABLET | ORAL | Status: AC
Start: 2012-10-27 — End: 2012-10-27
  Administered 2012-10-27: 324 mg via ORAL
  Filled 2012-10-26: qty 4

## 2012-10-26 MED ORDER — CLOPIDOGREL BISULFATE 75 MG PO TABS
75.0000 mg | ORAL_TABLET | Freq: Every day | ORAL | Status: DC
  Administered 2012-10-26 – 2012-10-28 (×3): 75 mg via ORAL
  Filled 2012-10-26 (×3): qty 1

## 2012-10-26 MED ORDER — ASPIRIN EC 81 MG PO TBEC
81.0000 mg | DELAYED_RELEASE_TABLET | Freq: Every day | ORAL | Status: DC
  Administered 2012-10-28: 09:00:00 81 mg via ORAL
  Filled 2012-10-26 (×2): qty 1

## 2012-10-26 MED ORDER — METOPROLOL TARTRATE 50 MG PO TABS
50.0000 mg | ORAL_TABLET | Freq: Two times a day (BID) | ORAL | Status: DC
Start: 2012-10-26 — End: 2012-10-28
  Administered 2012-10-26 – 2012-10-28 (×4): 50 mg via ORAL
  Filled 2012-10-26 (×6): qty 1

## 2012-10-26 MED ORDER — INSULIN ASPART 100 UNIT/ML ~~LOC~~ SOLN
3.0000 [IU] | Freq: Once | SUBCUTANEOUS | Status: AC
  Administered 2012-10-26: 3 [IU] via SUBCUTANEOUS

## 2012-10-26 MED ORDER — NITROGLYCERIN 0.4 MG SL SUBL: 0.4000 mg | SUBLINGUAL_TABLET | SUBLINGUAL | Status: DC | PRN

## 2012-10-26 MED ORDER — SODIUM CHLORIDE 0.9 % IJ SOLN: 3.0000 mL | INTRAMUSCULAR | Status: DC | PRN

## 2012-10-26 MED ORDER — VITAMIN D3 25 MCG (1000 UNIT) PO TABS
1000.0000 [IU] | ORAL_TABLET | Freq: Every day | ORAL | Status: DC
  Administered 2012-10-27 – 2012-10-28 (×2): 1000 [IU] via ORAL
  Filled 2012-10-26 (×2): qty 1

## 2012-10-26 MED ORDER — ONDANSETRON HCL 4 MG/2ML IJ SOLN: 4.0000 mg | Freq: Four times a day (QID) | INTRAMUSCULAR | Status: DC | PRN

## 2012-10-26 MED ORDER — ACETAMINOPHEN 325 MG PO TABS
650.0000 mg | ORAL_TABLET | ORAL | Status: DC | PRN
  Administered 2012-10-27 (×3): 650 mg via ORAL
  Filled 2012-10-26 (×3): qty 2

## 2012-10-26 MED ORDER — ASPIRIN 300 MG RE SUPP
300.0000 mg | RECTAL | Status: AC
  Filled 2012-10-26: qty 1

## 2012-10-26 MED ORDER — INSULIN ASPART 100 UNIT/ML ~~LOC~~ SOLN
0.0000 [IU] | Freq: Three times a day (TID) | SUBCUTANEOUS | Status: DC
  Administered 2012-10-27: 2 [IU] via SUBCUTANEOUS
  Administered 2012-10-27: 3 [IU] via SUBCUTANEOUS
  Administered 2012-10-28: 2 [IU] via SUBCUTANEOUS

## 2012-10-26 MED ORDER — CHOLECALCIFEROL 25 MCG (1000 UT) PO TABS: 1000.0000 [IU] | ORAL_TABLET | Freq: Every day | ORAL | Status: DC

## 2012-10-26 MED ORDER — ASPIRIN 81 MG PO TABS: 81.0000 mg | ORAL_TABLET | Freq: Every day | ORAL | Status: DC

## 2012-10-26 MED ORDER — LOSARTAN POTASSIUM 50 MG PO TABS
100.0000 mg | ORAL_TABLET | Freq: Every day | ORAL | Status: DC
Start: 2012-10-27 — End: 2012-10-28
  Administered 2012-10-27 – 2012-10-28 (×2): 100 mg via ORAL
  Filled 2012-10-26 (×2): qty 2

## 2012-10-26 MED ORDER — HEPARIN (PORCINE) IN NACL 100-0.45 UNIT/ML-% IJ SOLN
1850.0000 [IU]/kg/h | INTRAMUSCULAR | Status: DC
  Filled 2012-10-26 (×71): qty 250

## 2012-10-26 MED ORDER — VITAMIN B-12 1000 MCG PO TABS
1000.0000 ug | ORAL_TABLET | Freq: Every day | ORAL | Status: DC
  Administered 2012-10-27 – 2012-10-28 (×2): 1000 ug via ORAL
  Filled 2012-10-26 (×2): qty 1

## 2012-10-26 MED ORDER — ASPIRIN 81 MG PO CHEW
324.0000 mg | CHEWABLE_TABLET | ORAL | Status: AC
  Administered 2012-10-26: 324 mg via ORAL
  Filled 2012-10-26: qty 4

## 2012-10-26 NOTE — ED Provider Notes (Signed)
History     CSN: [id number]  Arrival date & time 10/26/12  1226   First MD Initiated Contact with Patient 10/26/12 1257      Chief Complaint  Patient presents with  . Atrial Fibrillation    (Consider location/radiation/quality/duration/timing/severity/associated sxs/prior treatment) HPI Comments: This is a 71 year old male, who presents to the emergency department with chief complaint of chest palpitations, chest pressure, and diaphoresis. Past medical history remarkable for previous MI, stroke, diabetes, hyperlipidemia, and hypertension, which are well controlled. Patient states the symptoms began this morning. His wife, who is a Engineer, civil (consulting), tells me that she gave him sublingual nitroglycerin which reduced his blood pressure. His wife states that his heart rate was still elevated, so she gave him another sublingual nitroglycerin which did not do anything. His wife tells me that she sought A. fib on the monitor when the paramedics picked him up. No prior history of A. Fib.  Patient's cardiologist is Dr. Tenny Craw.  The history is provided by the patient. No language interpreter was used.    Past Medical History  Diagnosis Date  . CAD (coronary artery disease)   . Diabetes mellitus     type 2  . Diverticulitis   . Hypertension   . Myocardial infarction   . Obesity     Past Surgical History  Procedure Date  . R 5th toe amputation 2009    Dr Ralene Cork    Family History  Problem Relation Age of Onset  . Asthma Neg Hx   . Cancer Mother 46    breast  . Heart disease Father 10    heart    History  Substance Use Topics  . Smoking status: Former Games developer  . Smokeless tobacco: Not on file     Comment: wife and daughter smoke in the house  . Alcohol Use: No      Review of Systems  All other systems reviewed and are negative.    Allergies  Review of patient's allergies indicates no known allergies.  Home Medications   Current Outpatient Rx  Name  Route  Sig  Dispense   Refill  . ACETAMINOPHEN 500 MG PO TABS   Oral   Take 1,000 mg by mouth at bedtime.         . ASPIRIN 81 MG PO TABS   Oral   Take 81 mg by mouth daily.           . CHOLECALCIFEROL 1000 UNITS PO TABS   Oral   Take 1,000 Units by mouth daily.           Marland Kitchen CLOPIDOGREL BISULFATE 75 MG PO TABS   Oral   Take 75 mg by mouth daily.         . FUROSEMIDE 80 MG PO TABS   Oral   Take 80 mg by mouth daily as needed. For fluid retention         . GABAPENTIN 300 MG PO CAPS   Oral   Take 300 mg by mouth 3 (three) times daily as needed. For nerve pain         . GLIMEPIRIDE 4 MG PO TABS   Oral   Take 4 mg by mouth 2 (two) times daily.         . INSULIN LISPRO (HUMAN) 100 UNIT/ML [place] SOLN   Subconjunctival   5-20 Units by Subconjunctival route 3 (three) times daily. Per sliding scale         . LOSARTAN POTASSIUM 100 MG PO  TABS   Oral   Take 100 mg by mouth daily.         Marland Kitchen METOPROLOL TARTRATE 50 MG PO TABS   Oral   Take 50 mg by mouth 2 (two) times daily.         Marland Kitchen NITROGLYCERIN 0.4 MG SL SUBL   Sublingual   Place 0.4 mg under the tongue every 5 (five) minutes as needed. For chest pain         . PIOGLITAZONE HCL 45 MG PO TABS   Oral   Take 45 mg by mouth daily.         Marland Kitchen PRAVASTATIN SODIUM 40 MG PO TABS   Oral   Take 40 mg by mouth daily.         Marland Kitchen VITAMIN B-12 1000 MCG PO TABS   Oral   Take 1,000 mcg by mouth daily.           Marland Kitchen BLOOD GLUCOSE METER KIT      Use as instructed   1 each   0   . GLUCOSE BLOOD VI STRP      1 each 4 (four) times daily -  before meals and at bedtime. Use as instructed            BP 137/56  Pulse 92  Temp 97.9 F (36.6 C) (Oral)  Resp 16  SpO2 98%  Physical Exam  Nursing note and vitals reviewed. Constitutional: He is oriented to person, place, and time. He appears well-developed and well-nourished.  HENT:  Head: Normocephalic and atraumatic.  Eyes: Conjunctivae normal and EOM are normal. Pupils are  equal, round, and reactive to light.  Neck: Normal range of motion. Neck supple.  Cardiovascular: Normal rate, regular rhythm and normal heart sounds.  Exam reveals no gallop and no friction rub.   No murmur heard. Pulmonary/Chest: Effort normal and breath sounds normal. No respiratory distress. He has no wheezes. He has no rales. He exhibits no tenderness.  Abdominal: Soft. Bowel sounds are normal.  Musculoskeletal: Normal range of motion.  Neurological: He is alert and oriented to person, place, and time.  Skin: Skin is warm and dry.  Psychiatric: He has a normal mood and affect. His behavior is normal. Judgment and thought content normal.    ED Course  Procedures (including critical care time)   Labs Reviewed  CBC WITH DIFFERENTIAL  BASIC METABOLIC PANEL   No results found.  ED ECG REPORT  I personally interpreted this EKG   Date: 10/26/2012   Rate:   Rhythm: premature ventricular contractions (PVC)  QRS Axis: normal  Intervals: normal  ST/T Wave abnormalities: nonspecific ST changes  Conduction Disutrbances:none  Narrative Interpretation:   Old EKG Reviewed: none available   1. A-fib   2. Palpitations       MDM  71 year old male with chest pain. Labs are pending. Patient's cardiologist is Dr. Tenny Craw.  I have discussed this patient with Dr. Rubin Payor.  Patient has received aspirin and O2.  Not currently having chest pain.  3:00 PM I have consulted [facility], who say that they will see the patient.  Started Heparin.   4:00 PM Patient is going to be admitted to the hospital.        Roxy Horseman, PA-C 10/26/12 [phone]

## 2012-10-26 NOTE — ED Notes (Signed)
Report given to floor nurse, Sharia Reeve, RN. Nurse had no further questions upon report. Preparing pt for transport to floor.

## 2012-10-26 NOTE — ED Notes (Signed)
Lab called with critical lab value, Brenda C. Dr. Rubin Payor made aware of lab value.

## 2012-10-26 NOTE — ED Notes (Signed)
Pt returned from  X ray placed back on monitors.

## 2012-10-26 NOTE — ED Notes (Signed)
Pt denies chest pain; denies shortness of breath; denies nausea 

## 2012-10-26 NOTE — H&P (Addendum)
CARDIOLOGY CONSULT NOTE  Patient ID: Charles [name] MRN: [id number], DOB/AGE: 71-Jan-1942   Admit date: 10/26/2012 Date of Consult: 10/26/2012   Primary Physician: Sonda Primes, MD Primary Cardiologist: Lovina Reach, MD  Pt. Profile  71 year old male with a history of CAD presented to the ED with chest tightness and palpitations this morning.   Problem List  Past Medical History  Diagnosis Date  . CAD (coronary artery disease)     a. Approx. 2000 - MI. Cath showed single vessel disease, medical management.   . Diabetes mellitus     type 2  . Diverticulitis   . Hypertension   . Obesity   . CVA (cerebral infarction)     a. Approx. 2002  . Cholelithiasis     Past Surgical History  Procedure Date  . R 5th toe amputation 2009    Dr Ralene Cork     Allergies  No Known Allergies  HPI   71 year old male with above problem list, which includes MI in 2000. Cath showed obstructive single vessel disease, medical management recommended. Also CVA, DM2, and HTN. On Saturday he developed nausea and vomiting that persisted for several days. When he woke up this morning those symptoms were relieved. He ate breakfast and shortly after became nauseated and noticed 7/10 substernal pressure and palpitations. Onset at 11 am, he denied SOB and diaphoresis. States this pain feels like when he had gallstones, and not like his previous MI. His wife gave him sublingual NTG x2 without improvement. EMS was called and upon arrival they placed Mr Brabson on the heart monitor. His wife who was a nurse, noted A-fib on the monitor. We have no evidence of arrhythmias from EMS or cardiac monitoring in the ED.  His symptoms reduced to 1/10 by the time of arrival to the ED. Currently he is pain free and denies SOB, diaphoresis, and nausea.  Inpatient Medications     Family History Family History  Problem Relation Age of Onset  . Asthma Neg Hx   . Cancer Mother 40    breast  . Heart disease Father 1    heart      Social History History   Social History  . Marital Status: Married    Spouse Name: N/A    Number of Children: N/A  . Years of Education: N/A   Occupational History  . Not on file.   Social History Main Topics  . Smoking status: Former Games developer  . Smokeless tobacco: Not on file     Comment: wife and daughter smoke in the house  . Alcohol Use: No  . Drug Use: No  . Sexually Active: Yes   Other Topics Concern  . Not on file   Social History Narrative   Disabled s/p CVA     Review of Systems  General:  No chills, fever, night sweats or weight changes.  Cardiovascular:  ++ chest pain, palpitations. No dyspnea on exertion, edema, orthopnea, paroxysmal nocturnal dyspnea. Dermatological: No rash, lesions/masses Respiratory: No cough, dyspnea Urologic: No hematuria, dysuria Abdominal:   ++ nausea, vomiting. No diarrhea, bright red blood per rectum, melena, or hematemesis Neurologic: Decreased peripheral vision. No wkns, changes in mental status. All other systems reviewed and are otherwise negative except as noted above.  Physical Exam  Blood pressure 131/54, pulse 76, temperature 97.9 F (36.6 C), temperature source Oral, resp. rate 19, height 6' 3.98" (1.93 m), weight 384 lb 14.8 oz (174.6 kg), SpO2 99.00%.  General: Pleasant,  Obese, NAD Psych: Normal affect. Neuro: Alert and oriented X 3. Moves all extremities spontaneously. 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 Lungs:  Resp regular and unlabored, CTA. Heart: RRR no s3, s4, or murmurs. Abdomen: Soft, non-tender, non-distended, BS + x 4.  Extremities: No clubbing, cyanosis or edema. DP/PT/Radials 2+ and equal bilaterally. 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  Labs   Basename 10/26/12 1338  CKTOTAL --  CKMB --  TROPONINI 0.37*   Lab Results  Component Value Date   WBC 10.1 10/26/2012   HGB 12.9* 10/26/2012   HCT  39.6 10/26/2012   MCV 93.0 10/26/2012   PLT 257 10/26/2012     Lab 10/26/12 1309  NA 138  K 3.8  CL 96  CO2 34*  BUN 12  CREATININE 0.76  CALCIUM 9.2  PROT --  BILITOT --  ALKPHOS --  ALT --  AST --  GLUCOSE 288*   Lab Results  Component Value Date   CHOL 143 06/02/2011   HDL 44.40 06/02/2011   LDLCALC 89 06/02/2011   TRIG 48.0 06/02/2011   No results found for this basename: DDIMER    Radiology/Studies  Dg Chest 2 View  10/26/2012  *RADIOLOGY REPORT*  Clinical Data: Chest pain.  Hypertension.  CHEST - 2 VIEW  Comparison: None.  Findings: The cardiac silhouette is mildly enlarged.  No mediastinal or hilar masses or adenopathy noted.  The lungs are clear.  There is minor blunting of the posterior costophrenic sulcus on the left, which may reflect minimal pleural fluid.  No pneumothorax.  There are bridging osteophytes along the mid lower thoracic spine.  IMPRESSION: No acute cardiopulmonary disease.   Original Report Authenticated By: Amie Portland, M.D.     ECG  RSR rate 74, with occasional PVC. No acute ST/T changes  ASSESSMENT AND PLAN  1. NSTEMI: Patient began having mid-sternal chest pressure this morning, associated with nausea. ECG shows no actue changes but POC and initial lab troponin were elevated. Given this objective evidence, his history and subjective evidence Mr Gasparini will benefit from cardiac catheterization. Admit to telemetry. Start nitroglycerine gtt, and IV heparin per pharmacy. Schedule for cath tomorrow morning.   2. GB:  History of stones by [place] in 2010 some nausea and vohmiting WBC normal and no RUQ pain  Abd [place] but may be limited given size.    3:  Rhythm  Wife indicates afib in field but no documentation by EMS or in ER doubt arrhythmia NSR in ER  4:  CVA:  Legally blind.  On Plavix for years Check P2Y in case stent needed  5.  DM-  Hold am insulin and 1/2 basal long acting for cath.  Has bad neuropathy and charcot feet but sees podiatrist   Regularly with doxycyline at home for any signs of infection.     Signed, Nicolasa Ducking, NP 10/26/2012, 3:48 PM  Assessment and plan filled out by me.  Poorly controlled diabetic with chest pain and positive troponin.  Known distant disease.  No acute ECG changes Discussed cath with patient and wife who is a Engineer, civil (consulting).  Preferably from radial approach.  Abdomen benign with no RUQ pain and history of stones. Check [place].  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

## 2012-10-26 NOTE — ED Notes (Signed)
Patient transported to X-ray 

## 2012-10-26 NOTE — ED Notes (Signed)
Pt denies pain in chest currently; pt states pressure there earlier not pain

## 2012-10-26 NOTE — ED Notes (Signed)
Cardiology at bedside.

## 2012-10-26 NOTE — ED Notes (Signed)
Per EMS: pt around 1100 feeling chest palpitations; wife states chest pressure and diaphoretic; ASA and nitro given by wife with relief; HR in 170; monitor showed a.fib; pt nevertold had a.fib; pressure 120/80 en route; pt showed NSR on monitor now HR in 100s; substernal no radiant; diabetic CBG 220; hx of MI and CVA; NKDA

## 2012-10-26 NOTE — ED Notes (Signed)
Awaiting heparin from pharmacy.

## 2012-10-26 NOTE — Progress Notes (Signed)
ANTICOAGULATION CONSULT NOTE - Initial Consult  Pharmacy Consult for heparin Indication: chest pain/ACS  No Known Allergies  Patient Measurements: Height: 6' 3.98" (193 cm) (as of 05/25/12) Weight: 384 lb 14.8 oz (174.6 kg) (as of 05/24/12) IBW/kg (Calculated) : 86.76  Heparin Dosing Weight: 121 kg  Vital Signs: Temp: 97.9 F (36.6 C) (11/14 1237) Temp src: Oral (11/14 1237) BP: 133/58 mmHg (11/14 1352) Pulse Rate: 77  (11/14 1352)  Labs:  Basename 10/26/12 1309  HGB 12.9*  HCT 39.6  PLT 257  APTT --  LABPROT --  INR --  HEPARINUNFRC --  CREATININE 0.76  CKTOTAL --  CKMB --  TROPONINI --    Estimated Creatinine Clearance: 146 ml/min (by C-G formula based on Cr of 0.76).   Medical History: Past Medical History  Diagnosis Date  . CAD (coronary artery disease)   . Diabetes mellitus     type 2  . Diverticulitis   . Hypertension   . Myocardial infarction   . Obesity     Medications: see med hx; on plavix and asa 81 mg  Assessment: [name] is a 71 yo man in the ED with chest pain to start heparin drip of ACS.  His height and weight as of 6/13 is 174.6 kg and 193 cm.  His troponin is 0.13. His H/H is 12.9/39.6 and his PLTC is 257. PMH significant for CAD, DM 2, diverticulitis, HTN, MI, obesity.   Goal of Therapy:  Heparin level 0.3-0.7 units/ml Monitor platelets by anticoagulation protocol: Yes   Plan:  Give 4000 units bolus x 1 Start heparin infusion at 1850 units/hr Check anti-Xa level in 8 hours and daily while on heparin Continue to monitor H&H and platelets Herby Abraham, Pharm.D. [phone] 10/26/2012 2:25 PM

## 2012-10-26 NOTE — ED Notes (Signed)
Pt denies nausea and shortness of breath; pt denies chest palpitations currently; pt mentating appropriately

## 2012-10-26 NOTE — ED Notes (Signed)
Results of troponin I shown to Dr. Rubin Payor

## 2012-10-26 NOTE — ED Notes (Signed)
Pt denies chest pain

## 2012-10-27 ENCOUNTER — Encounter (HOSPITAL_COMMUNITY)
Admission: EM | Disposition: A | Discharge status: Home / Self Care | Payer: Self-pay | Source: Home / Self Care | Attending: Cardiology

## 2012-10-27 DIAGNOSIS — I4891 Unspecified atrial fibrillation: Secondary | ICD-10-CM | POA: Diagnosis not present

## 2012-10-27 DIAGNOSIS — I2510 Atherosclerotic heart disease of native coronary artery without angina pectoris: Secondary | ICD-10-CM | POA: Diagnosis not present

## 2012-10-27 HISTORY — PX: LEFT HEART CATHETERIZATION WITH CORONARY ANGIOGRAM: SHX5451

## 2012-10-27 LAB — BASIC METABOLIC PANEL
BUN: 11 mg/dL (ref 6–23)
CO2: 32 mEq/L (ref 19–32)
Chloride: 96 mEq/L (ref 96–112)
Creatinine, Ser: 0.81 mg/dL (ref 0.50–1.35)
GFR calc Af Amer: >90 mL/min (ref >90)
Potassium: 2.8 mEq/L — ABNORMAL LOW (ref 3.5–5.1)

## 2012-10-27 LAB — CBC
MCH: 30.2 pg (ref 26.0–34.0)
MCHC: 32.7 g/dL (ref 30.0–36.0)
RDW: 15 % (ref 11.5–15.5)

## 2012-10-27 LAB — POCT ACTIVATED CLOTTING TIME: Activated Clotting Time: 479 seconds

## 2012-10-27 LAB — TSH: TSH: 1.195 u[IU]/mL (ref 0.350–4.500)

## 2012-10-27 LAB — LIPID PANEL
Cholesterol: 144 mg/dL (ref 0–200)
HDL: 43 mg/dL (ref >39)
Total CHOL/HDL Ratio: 3.3 RATIO
VLDL: 15 mg/dL (ref 0–40)

## 2012-10-27 LAB — GLUCOSE, CAPILLARY
Glucose-Capillary: 124 mg/dL — ABNORMAL HIGH (ref 70–99)
Glucose-Capillary: 181 mg/dL — ABNORMAL HIGH (ref 70–99)

## 2012-10-27 SURGERY — LEFT HEART CATHETERIZATION WITH CORONARY ANGIOGRAM
Anesthesia: LOCAL

## 2012-10-27 MED ORDER — FENTANYL CITRATE 0.05 MG/ML IJ SOLN
INTRAMUSCULAR | Status: AC
  Filled 2012-10-27: qty 2

## 2012-10-27 MED ORDER — POTASSIUM CHLORIDE CRYS ER 20 MEQ PO TBCR
EXTENDED_RELEASE_TABLET | ORAL | Status: AC
  Filled 2012-10-27: qty 3

## 2012-10-27 MED ORDER — HEPARIN SODIUM (PORCINE) 1000 UNIT/ML IJ SOLN
INTRAMUSCULAR | Status: AC
  Filled 2012-10-27: qty 1

## 2012-10-27 MED ORDER — NITROGLYCERIN IN D5W 200-5 MCG/ML-% IV SOLN
INTRAVENOUS | Status: AC
  Filled 2012-10-27: qty 250

## 2012-10-27 MED ORDER — MIDAZOLAM HCL 2 MG/2ML IJ SOLN
INTRAMUSCULAR | Status: AC
  Filled 2012-10-27: qty 2

## 2012-10-27 MED ORDER — SODIUM CHLORIDE 0.9 % IV SOLN
INTRAVENOUS | Status: AC
  Administered 2012-10-27: 17:00:00 via INTRAVENOUS

## 2012-10-27 MED ORDER — INSULIN ASPART 100 UNIT/ML ~~LOC~~ SOLN
3.0000 [IU] | Freq: Once | SUBCUTANEOUS | Status: AC
  Administered 2012-10-27: 3 [IU] via SUBCUTANEOUS

## 2012-10-27 MED ORDER — VERAPAMIL HCL 2.5 MG/ML IV SOLN
INTRAVENOUS | Status: AC
  Filled 2012-10-27: qty 2

## 2012-10-27 MED ORDER — LIDOCAINE HCL (PF) 1 % IJ SOLN
INTRAMUSCULAR | Status: AC
  Filled 2012-10-27: qty 30

## 2012-10-27 MED ORDER — SODIUM CHLORIDE 0.9 % IV SOLN
0.2500 mg/kg/h | INTRAVENOUS | Status: DC
  Filled 2012-10-27: qty 250

## 2012-10-27 MED ORDER — POTASSIUM CHLORIDE IN NACL 20-0.45 MEQ/L-% IV SOLN
INTRAVENOUS | Status: DC
  Administered 2012-10-27: 75 mL/h via INTRAVENOUS
  Filled 2012-10-27 (×2): qty 1000

## 2012-10-27 MED ORDER — HEPARIN (PORCINE) IN NACL 2-0.9 UNIT/ML-% IJ SOLN
INTRAMUSCULAR | Status: AC
  Filled 2012-10-27: qty 500

## 2012-10-27 MED ORDER — HEPARIN (PORCINE) IN NACL 2-0.9 UNIT/ML-% IJ SOLN
INTRAMUSCULAR | Status: AC
  Filled 2012-10-27: qty 1000

## 2012-10-27 MED ORDER — INFLUENZA VIRUS VACC SPLIT PF IM SUSP
0.5000 mL | INTRAMUSCULAR | Status: AC
  Administered 2012-10-28: 0.5 mL via INTRAMUSCULAR
  Filled 2012-10-27 (×2): qty 0.5

## 2012-10-27 MED ORDER — POTASSIUM CHLORIDE CRYS ER 20 MEQ PO TBCR
60.0000 meq | EXTENDED_RELEASE_TABLET | Freq: Once | ORAL | Status: AC
  Administered 2012-10-27: 60 meq via ORAL

## 2012-10-27 MED ORDER — NITROGLYCERIN 0.2 MG/ML ON CALL CATH LAB
INTRAVENOUS | Status: AC
  Filled 2012-10-27: qty 1

## 2012-10-27 MED ORDER — BIVALIRUDIN 250 MG IV SOLR
INTRAVENOUS | Status: AC
  Filled 2012-10-27: qty 250

## 2012-10-27 NOTE — CV Procedure (Signed)
Cardiac Catheterization Operative Report  Charles [name] [id number] 11/15/20133:31 PM Sonda Primes, MD  Procedure Performed:  1. Left Heart Catheterization 2. Selective Coronary Angiography 3. Left ventricular angiogram 4. Guide placement into RCA with coronary wire down RCA in attempt to IVUS RCA  Operator: Verne Carrow, MD  Arterial access site:  Right radial artery.   Indication: 71 yo male with history of CAD, DM, HTN, former smoker admitted with chest pain in setting of atrial fibrillation. Troponin mildly elevated at 1.67.                                 Procedure Details: The risks, benefits, complications, treatment options, and expected outcomes were discussed with the patient. The patient and/or family concurred with the proposed plan, giving informed consent. The patient was brought to the cath lab after IV hydration was begun and oral premedication was given. The patient was further sedated with Versed and Fentanyl. The right wrist was assessed with an Allens test which was positive. The right wrist was prepped and draped in a sterile fashion. 1% lidocaine was used for local anesthesia. Using the modified Seldinger access technique, a 5 French sheath was placed in the right radial artery. 3 mg Verapamil was given through the sheath. 5500 units IV heparin was given. Standard diagnostic catheters were used to perform selective coronary angiography. A pigtail catheter was used to perform a left ventricular angiogram. He was found to have an eccentric calcified proximal to mid RCA stenosis. This appeared to be a calcified plaque and did not have the appearance of thrombus. To better define the stenosis, I elected to perform an IVUS of the mid RCA. The sheath was upsized to a 6 [place] system. He was given a bolus of Angiomax and a drip was started. I then engaged the RCA with a JR4 guiding catheter. The ACT was confirmed to be above 200. I then attempted to pass a BMW wire  down the RCA but the calcific plaque was just beyond a tight turn in the vessel (shepherd's crook). To get better guide support, I switched out to a AL 0.75 guiding catheter. I then passed a Whisper wire down the RCA. I was unable to pass the IVUS catheter around the bend in the proximal vessel. I then used a Guideliner to gain better support in the vessel but the IVUS catheter would not fit through the Guideliner. I then removed the Guideliner. I then passed the BMW wire into the mid vessel for support. I was still unable to pass the IVUS catheter down the RCA. The case was stopped.   The sheath was removed from the right radial artery and a Terumo hemostasis band was applied at the arteriotomy site on the right wrist.    There were no immediate complications. The patient was taken to the recovery area in stable condition.   Hemodynamic Findings: Central aortic pressure: 173/85 Left ventricular pressure: 167/12/18  Angiographic Findings:  Left main: No obstructive disease.   Left Anterior Descending Artery: Large caliber vessel with severe calcification in the proximal and mid vessel. There appears to be diffuse 30% stenosis in the proximal and mid vessel. The distal vessel tapers to a small caliber vessel and has diffuse 60% stenosis.   Circumflex Artery:  Large caliber vessel with 30% proximal stenosis. The first obtuse marginal branch is small in caliber and has mild disease. The obtuse marginal branch is  a moderate sized bifurcating vessel with 40% stenosis in the inferior branch.   Right Coronary Artery: Large dominant artery with 50-60% mid stenosis just beyond the tight upward turn in the vessel. This appears to be an eccentric, calcified stenosis. The remainder of the mid and distal vessel has mild plaque.   Left Ventricular Angiogram: LVEF=55-60%  Impression: 1. Double vessel CAD 2. Moderate stenosis RCA, appears to be an eccentric calcified stenosis. This does not appear to be flow  limiting. I attempted IVUS to better visualize but due to tortuous anatomy and poor guide support from the radial approach, I was unable to pass the IVUS catheter around the severe bend in the vessel. The reports in the chart from cath in 2000 indicate a 60% stenosis in the RCA suggesting that this is a stable lesion.  3. Chest pain and slight elevation of troponin in the setting of atrial fibrillation  Recommendations: He will need further evaluation of atrial fibrillation. He is in sinus now. Would medically manage CAD. Aggressive risk factor modification.        Complications:  None. The patient tolerated the procedure well.

## 2012-10-27 NOTE — Progress Notes (Signed)
Pt admitted with NSTEMI. Plans for cath today. K is 2.8 Will give 60 meq KCl now and start 1/2 NS with KCl added at 75 cc/hour. R/B reviewed.   MCALHANY,CHRISTOPHER 7:35 AM 10/27/2012

## 2012-10-27 NOTE — Progress Notes (Signed)
ANTICOAGULATION CONSULT NOTE - Follow Up Consult  Pharmacy Consult for heparin Indication: chest pain/ACS  No Known Allergies  Patient Measurements: Height: 6' 3.98" (193 cm) (as of 05/25/12) Weight: 370 lb 14.4 oz (168.239 kg) IBW/kg (Calculated) : 86.76  Heparin Dosing Weight: 121 kg  Vital Signs: Temp: 98.5 F (36.9 C) (11/15 1200) Temp src: Oral (11/15 1200) BP: 140/59 mmHg (11/15 1200) Pulse Rate: 76  (11/15 1200)  Labs:  Basename 10/27/12 0910 10/27/12 0605 10/27/12 0535 10/27/12 0015 10/26/12 1840 10/26/12 1309  HGB -- 12.7* -- -- -- 12.9*  HCT -- 38.8* -- -- -- 39.6  PLT -- 262 -- -- -- 257  APTT -- -- -- -- -- --  LABPROT -- -- 14.3 -- -- --  INR -- -- 1.13 -- -- --  HEPARINUNFRC 0.58 -- -- 0.33 -- --  CREATININE -- 0.81 -- -- -- 0.76  CKTOTAL -- -- -- -- -- --  CKMB -- -- -- -- -- --  TROPONINI -- 0.61* -- 1.03* 1.67* --    Estimated Creatinine Clearance: 141.3 ml/min (by C-G formula based on Cr of 0.81).   Assessment: 71 yo male with chest pain on heparin drip of ACS. Heparin level (0.58) is at goal. Plan for cath today.   Goal of Therapy:  Heparin level 0.3-0.7 units/ml Monitor platelets by anticoagulation protocol: Yes   Plan:  -No heparin changes needed -Will follow post cath  Harland German, Pharm D 10/27/2012 1:05 PM

## 2012-10-27 NOTE — Progress Notes (Signed)
ANTICOAGULATION CONSULT NOTE - Follow Up Consult  Pharmacy Consult for heparin Indication: chest pain/ACS  No Known Allergies  Patient Measurements: Height: 6' 3.98" (193 cm) (as of 05/25/12) Weight: 384 lb 14.8 oz (174.6 kg) (as of 05/24/12) IBW/kg (Calculated) : 86.76  Heparin Dosing Weight: 121 kg  Vital Signs: Temp: 97.5 F (36.4 C) (11/15 0000) Temp src: Oral (11/15 0000) BP: 121/66 mmHg (11/14 2115) Pulse Rate: 98  (11/14 2115)  Labs:  Basename 10/27/12 0015 10/26/12 1840 10/26/12 1338 10/26/12 1309  HGB -- -- -- 12.9*  HCT -- -- -- 39.6  PLT -- -- -- 257  APTT -- -- -- --  LABPROT -- -- -- --  INR -- -- -- --  HEPARINUNFRC 0.33 -- -- --  CREATININE -- -- -- 0.76  CKTOTAL -- -- -- --  CKMB -- -- -- --  TROPONINI -- 1.67* 0.37* --    Estimated Creatinine Clearance: 146 ml/min (by C-G formula based on Cr of 0.76).   Medical History: Past Medical History  Diagnosis Date  . CAD (coronary artery disease)     a. Approx. 2000 - MI. Cath showed single vessel disease, medical management.   . Diverticulitis   . Hypertension   . Obesity   . CVA (cerebral infarction)     a. Approx. 2002  . Cholelithiasis   . Hypercholesterolemia   . Anginal pain   . Exertional dyspnea   . ED (erectile dysfunction)   . OSA on CPAP   . Type II diabetes mellitus   . History of stomach ulcers     "issues a long long time ago" (10/26/2012)  . History of MRSA infection     "little toe right foot" (10/26/2012)  . Myocardial infarction 1990's  . Tunnel vision     "both eyes since stroke"  . 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 Claustrophobia   . Atrial fibrillation 10/26/2012  . B12 deficiency   . Diabetic neuropathy     Medications: see med hx; on plavix and asa 81 mg  Assessment: Charles [name] is a 71 yo man with chest pain on heparin drip of ACS. Heparin level (0.33) is at the lower end of goal range. Plan for cath today.    Goal of Therapy:  Heparin level 0.3-0.7 units/ml Monitor platelets by anticoagulation protocol: Yes   Plan:  1. Increase IV heparin to 2000 units/hr to keep at-goal.  2. Heparin level in 8 hours vs f/u post-cath.  Lorre Munroe, PharmD 10/27/2012 1:30 AM

## 2012-10-27 NOTE — Care Management Note (Signed)
    Page 1 of 1   10/27/2012     8:54:25 AM   CARE MANAGEMENT NOTE 10/27/2012  Patient:  SHARMARKE, CICIO A   Account Number:  [id number]  Date Initiated:  10/27/2012  Documentation initiated by:  Junius Creamer  Subjective/Objective Assessment:   adm w pos troponins     Action/Plan:   lives w wife, pcp dr a plotnikov   Anticipated DC Date:     Anticipated DC Plan:        DC Planning Services  CM consult      Choice offered to / List presented to:             Status of service:   Medicare Important Message given?   (If response is "NO", the following Medicare IM given date fields will be blank) Date Medicare IM given:   Date Additional Medicare IM given:    Discharge Disposition:  HOME/SELF CARE  Per UR Regulation:  Reviewed for med. necessity/level of care/duration of stay  If discussed at Long Length of Stay Meetings, dates discussed:    Comments:  11/15 8:53a debbie Karyssa Amaral rn,bsn [phone]

## 2012-10-27 NOTE — Progress Notes (Signed)
TR BAND REMOVAL  LOCATION:    right radial  DEFLATED PER PROTOCOL:    yes  TIME BAND OFF / DRESSING APPLIED:     21:10  SITE UPON ARRIVAL:    Level 0  SITE AFTER BAND REMOVAL:    Level 0  REVERSE ALLEN'S TEST:     positive  CIRCULATION SENSATION AND MOVEMENT:    Within Normal Limits   yes  COMMENTS:

## 2012-10-27 NOTE — Interval H&P Note (Signed)
History and Physical Interval Note:  10/27/2012 1:59 PM  Charles [name]  has presented today for cardiac cath with the diagnosis of Chest pain  The various methods of treatment have been discussed with the patient and family. After consideration of risks, benefits and other options for treatment, the patient has consented to  Procedure(s) (LRB) with comments: LEFT HEART CATHETERIZATION WITH CORONARY ANGIOGRAM (N/A) as a surgical intervention .  The patient's history has been reviewed, patient examined, no change in status, stable for surgery.  I have reviewed the patient's chart and labs.  Questions were answered to the patient's satisfaction.     Demone Lyles

## 2012-10-27 NOTE — ED Provider Notes (Signed)
Medical screening examination/treatment/procedure(s) were performed by non-physician practitioner and as supervising physician I was immediately available for consultation/collaboration.he  Juliet Rude. Rubin Payor, MD 10/27/12 1054

## 2012-10-28 ENCOUNTER — Encounter (HOSPITAL_COMMUNITY): Payer: Self-pay | Admitting: Physician Assistant

## 2012-10-28 DIAGNOSIS — I4891 Unspecified atrial fibrillation: Secondary | ICD-10-CM | POA: Diagnosis not present

## 2012-10-28 LAB — CBC
MCH: 29.7 pg (ref 26.0–34.0)
Platelets: 237 10*3/uL (ref 150–400)
RBC: 3.8 MIL/uL — ABNORMAL LOW (ref 4.22–5.81)
RDW: 14.8 % (ref 11.5–15.5)

## 2012-10-28 LAB — HEPATIC FUNCTION PANEL
ALT: 59 U/L — ABNORMAL HIGH (ref 0–53)
AST: 27 U/L (ref 0–37)
Alkaline Phosphatase: 133 U/L — ABNORMAL HIGH (ref 39–117)
Bilirubin, Direct: 0.2 mg/dL (ref 0.0–0.3)
Indirect Bilirubin: 0.4 mg/dL (ref 0.3–0.9)
Total Bilirubin: 0.6 mg/dL (ref 0.3–1.2)

## 2012-10-28 LAB — BASIC METABOLIC PANEL
BUN: 10 mg/dL (ref 6–23)
CO2: 31 mEq/L (ref 19–32)
Chloride: 97 mEq/L (ref 96–112)
Creatinine, Ser: 0.71 mg/dL (ref 0.50–1.35)
GFR calc Af Amer: >90 mL/min (ref >90)
Glucose, Bld: 215 mg/dL — ABNORMAL HIGH (ref 70–99)

## 2012-10-28 MED ORDER — POTASSIUM CHLORIDE CRYS ER 20 MEQ PO TBCR
EXTENDED_RELEASE_TABLET | ORAL | Status: AC
  Filled 2012-10-28: qty 2

## 2012-10-28 MED ORDER — POTASSIUM CHLORIDE CRYS ER 20 MEQ PO TBCR
40.0000 meq | EXTENDED_RELEASE_TABLET | Freq: Once | ORAL | Status: AC
  Administered 2012-10-28: 12:00:00 40 meq via ORAL

## 2012-10-28 NOTE — Progress Notes (Signed)
CARDIAC REHAB PHASE I   PRE:  Rate/Rhythm: 79 sinus  BP:  Supine:   Sitting: 137/58  Standing:    SaO2:   MODE:  Ambulation: 400 ft   POST:  Rate/Rhythem: 84 sinus  BP:  Supine:   Sitting: 147/72  Standing:    SaO2: 97 RA  Order received and appreciated.  Chart reviewed.  Pt ambulated 400 ft with assist x1 using rolling walker for stability.  Gait improved the longer he was on his feet.  Pt returned to chair after walk with wife on bedside.  Reviewed pt d/c eduation for diet, exercise, restrictions and when to call MD/911.  Pt and wife voiced understanding. Charles Pierce, MA, ACSM RCEP [phone] [name]

## 2012-10-28 NOTE — Discharge Summary (Signed)
Discharge Summary   Patient ID: Charles [name],  MRN: [id number], DOB/AGE: [birth year] 71 y.o.  Admit date: 10/26/2012 Discharge date: 10/28/2012   Primary Care Physician:  Sonda Primes   Primary Cardiologist:  Dr. Dietrich Pates  Primary Electrophysiologist:  None   Reason for Admission:  Non-STEMI  Primary Discharge Diagnoses:   1. Non-STEMI - no culprit on LHC this admission; Med Rx continued 2. Cholelithiasis - needs outpatient workup with PCP 3. Hypokalemia - Corrected at discharge 4. Atrial Fibrillation - Not documented but noted only by report x 1; Plan outpatient event monitor  Secondary Discharge Diagnoses:   Past Medical History  Diagnosis Date  . CAD (coronary artery disease)     a. Approx. 2000 - MI. Cath showed single vessel disease, PTCA dLAD/medical management. ;  b. NSTEMI 11/13 => LHC: prox and mid LAD 30%, dLAD 60%, pCFX 30%, inf branch of OM 40%, mRCA 50-60%, EF 55-60%; IVUS attempted for RCA but not successful; anatomy felt stable from 2000 => med Rx.  . Diverticulitis   . Hypertension   . Obesity   . CVA (cerebral infarction)     a. Approx. 2002  . Cholelithiasis   . Hypercholesterolemia   . ED (erectile dysfunction)   . OSA on CPAP   . Type II diabetes mellitus   . History of stomach ulcers     "issues a long long time ago" (10/26/2012)  . History of MRSA infection     "little toe right foot" (10/26/2012)  . Tunnel vision     "both eyes since stroke"  . 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 Claustrophobia   . B12 deficiency   . Diabetic neuropathy     Allergies:   No Known Allergies   Procedures Performed This Admission:    Cardiac Catheterization 10/27/12:  Hemodynamic Findings:  Central aortic pressure: 173/85  Left ventricular pressure: 167/12/18  Angiographic Findings:  Left main: No obstructive disease.  Left Anterior Descending Artery: Large caliber vessel with severe calcification  in the proximal and mid vessel. There appears to be diffuse 30% stenosis in the proximal and mid vessel. The distal vessel tapers to a small caliber vessel and has diffuse 60% stenosis.  Circumflex Artery: Large caliber vessel with 30% proximal stenosis. The first obtuse marginal branch is small in caliber and has mild disease. The obtuse marginal branch is a moderate sized bifurcating vessel with 40% stenosis in the inferior branch.  Right Coronary Artery: Large dominant artery with 50-60% mid stenosis just beyond the tight upward turn in the vessel. This appears to be an eccentric, calcified stenosis. The remainder of the mid and distal vessel has mild plaque.  Left Ventricular Angiogram: LVEF=55-60%  Impression:  1. Double vessel CAD  2. Moderate stenosis RCA, appears to be an eccentric calcified stenosis. This does not appear to be flow limiting. I attempted IVUS to better visualize but due to tortuous anatomy and poor guide support from the radial approach, I was unable to pass the IVUS catheter around the severe bend in the vessel. The reports in the chart from cath in 2000 indicate a 60% stenosis in the RCA suggesting that this is a stable lesion.  3. Chest pain and slight elevation of troponin in the setting of atrial fibrillation  Recommendations: He will need further evaluation of atrial fibrillation. He is in sinus now. Would medically manage CAD. Aggressive risk factor modification.  Complications: None.  The patient tolerated the procedure well.    Hospital Course: Charles [name] is a 71 y.o. male with a history of CAD, status post remote MI in 2000, prior stroke, hypertension, hyperlipidemia, cholelithiasis. Patient presented on the date of admission with complaints of nausea and vomiting for several days and onset of substernal pressure with palpitations. Patient was transported to the hospital via EMS. His wife, who is a Engineer, civil (consulting), noted atrial fibrillation on the monitor en route. However, no  formal documentation of atrial fibrillation noted during this admission. He has remained in normal sinus rhythm. Cardiac markers returned abnormal ruling him in for myocardial infarction. Cardiac catheterization was performed. As noted above, he had moderate two-vessel CAD. No clear culprit. IVUS was attempted of the RCA. This was not successful. His anatomy appeared to be stable when compared to report from 2000 and medical therapy was recommended. Ejection fraction is preserved. Patient had significant hypokalemia and this was corrected. Basic metabolic panel was obtained and K+ still low.  More K+ given prior to d/c.  Will plan on follow up BMET in several days. Patient also had significant symptoms that reminded him of previous gallbladder attacks and he has known cholelithiasis. Hepatic function panel obtained with minimal ALP elevation and normal TBili.  He can follow up with PCP next week with outpatient work up.  Discharge Vitals: Blood pressure 133/44, pulse 73, temperature 97.7 F (36.5 C), temperature source Oral, resp. rate 19, height 6' 3.98" (1.93 m), weight 370 lb 13 oz (168.2 kg), SpO2 98.00%.   Labs: Lab Results  Component Value Date   WBC 7.3 10/28/2012   HGB 11.3* 10/28/2012   HCT 35.2* 10/28/2012   MCV 92.6 10/28/2012   PLT 237 10/28/2012     Lab 10/28/12 1047  NA 133*  K 3.1*  CL 97  CO2 31  BUN 10  CREATININE 0.71  CALCIUM 8.8  PROT 6.3  BILITOT 0.6  ALKPHOS 133*  ALT 59*  AST 27  GLUCOSE 215*    Basename 10/27/12 0605 10/27/12 0015 10/26/12 1840 10/26/12 1338  CKTOTAL -- -- -- --  CKMB -- -- -- --  TROPONINI 0.61* 1.03* 1.67* 0.37*   Lab Results  Component Value Date   CHOL 144 10/27/2012   HDL 43 10/27/2012   LDLCALC 86 10/27/2012   TRIG 76 10/27/2012   No results found for this basename: DDIMER   Lab Results  Component Value Date   TSH 1.195 10/26/2012    Diagnostic Procedures and Studies:  Dg Chest 2 View    10/26/2012    IMPRESSION: No  acute cardiopulmonary disease.   Original Report Authenticated By: Amie Portland, M.D.     Disposition  Pt is being discharged home today in good condition.  Follow-up Plans & Appointments      Follow-up Information    Follow up with [facility]. In 5 days. (office will call to arrange labs: BMET)    Contact information:   [address] [place] [address] [phone]      Follow up with [facility]. In 1 week. (office will call to arrange Event Monitor (heart monitor))    Contact information:   [address] [place] [address] [phone]      Follow up with Dietrich Pates, MD. In 4 weeks. (office will call to arrange follow up with Dr. Tenny Craw in 3-4 weeks)    Contact information:   [address] [place] [address] [phone]  Follow up with Sonda Primes, MD. Schedule an appointment as soon as possible for a visit in 1 week. (to arrange evaluation of gallstones)    Contact information:   [address] [place] Schwalbe [place] [address] [phone]          Discharge Medications    Medication List     As of 10/28/2012 12:39 PM    TAKE these medications         acetaminophen 500 MG tablet   Commonly known as: TYLENOL   Take 1,000 mg by mouth at bedtime.      aspirin 81 MG tablet   Take 81 mg by mouth daily.      Blood Glucose Meter kit   Use as instructed      clopidogrel 75 MG tablet   Commonly known as: PLAVIX   Take 75 mg by mouth daily.      EQL VITAMIN D3 1000 UNITS tablet   Generic drug: Cholecalciferol   Take 1,000 Units by mouth daily.      furosemide 80 MG tablet   Commonly known as: LASIX   Take 80 mg by mouth daily as needed. For fluid retention      gabapentin 300 MG capsule   Commonly known as: NEURONTIN   Take 300 mg by mouth 3 (three) times daily as needed. For nerve pain      glimepiride 4 MG tablet   Commonly known as: AMARYL   Take 4 mg by  mouth 2 (two) times daily.      glucose blood test strip   1 each 4 (four) times daily -  before meals and at bedtime. Use as instructed      insulin lispro 100 UNIT/ML injection   Commonly known as: HUMALOG   5-20 Units by Subconjunctival route 3 (three) times daily. Per sliding scale      losartan 100 MG tablet   Commonly known as: COZAAR   Take 100 mg by mouth daily.      metoprolol 50 MG tablet   Commonly known as: LOPRESSOR   Take 50 mg by mouth 2 (two) times daily.      nitroGLYCERIN 0.4 MG SL tablet   Commonly known as: NITROSTAT   Place 0.4 mg under the tongue every 5 (five) minutes as needed. For chest pain      pioglitazone 45 MG tablet   Commonly known as: ACTOS   Take 45 mg by mouth daily.      pravastatin 40 MG tablet   Commonly known as: PRAVACHOL   Take 40 mg by mouth daily.      vitamin B-12 1000 MCG tablet   Commonly known as: CYANOCOBALAMIN   Take 1,000 mcg by mouth daily.          Outstanding Labs/Studies  1. BMET early next week 2. Event Monitor to be placed as an outpatient   Duration of Discharge Encounter: Greater than 30 minutes including physician and PA time.  Signed, Tereso Newcomer, PA-C  12:39 PM 10/28/2012        [address] [place], [place]  [address] Phone: [phone] Fax:  [phone] Jesse Sans. Daleen Squibb, MD, [facility] Pager:  [phone]

## 2012-10-28 NOTE — Progress Notes (Signed)
Progress Note  Name:  Charles [name]  Date:  10/28/2012 10:16 AM    Subjective:  No chest pain or dyspnea.    Objective:   Filed Vitals:   10/27/12 2000 10/28/12 0015 10/28/12 0545 10/28/12 0700  BP: 135/63 131/60 139/52 133/44  Pulse: 80 70 71 73  Temp: 98.9 F (37.2 C) 98.6 F (37 C) 97.6 F (36.4 C) 97.7 F (36.5 C)  TempSrc: Oral Oral Oral Oral  Resp: 17 18 16 19   Height:      Weight:  370 lb 13 oz (168.2 kg)    SpO2: 97% 100% 97% 98%    Intake/Output Summary (Last 24 hours) at 10/28/12 1016 Last data filed at 10/27/12 2000  Gross per 24 hour  Intake    490 ml  Output    800 ml  Net   -310 ml    Tele:  Sinus Rhythm  PHYSICAL EXAM General: no acute distress Neck:  No JVD Lungs: Clear to auscultation bilaterally  Heart: normal S1, S2;  Regular rate and rhythm, no murmur Abdomen: soft, nontender Extremities: no clubbing, cyanosis or edema; right wrist without hematoma or bruit  Skin:  No rash; warm and dry Psych:  Normal affect  Labs:  Basename 10/27/12 0605 10/26/12 1841 10/26/12 1309  NA 135 -- 138  K 2.8* -- 3.8  CL 96 -- 96  CO2 32 -- 34*  GLUCOSE 115* -- 288*  BUN 11 -- 12  CREATININE 0.81 -- 0.76  CALCIUM 8.8 -- 9.2  MG -- 1.8 --  PHOS -- -- --   No results found for this basename: AST:2,ALT:2,ALKPHOS:2,BILITOT:2,PROT:2,ALBUMIN:2 in the last 72 hours No results found for this basename: LIPASE:2,AMYLASE:2 in the last 72 hours  Basename 10/28/12 0541 10/27/12 0605 10/26/12 1309  WBC 7.3 7.7 --  NEUTROABS -- -- 8.8*  HGB 11.3* 12.7* --  HCT 35.2* 38.8* --  MCV 92.6 92.2 --  PLT 237 262 --    Basename 10/27/12 0605 10/27/12 0015 10/26/12 1840 10/26/12 1338  CKTOTAL -- -- -- --  CKMB -- -- -- --  TROPONINI 0.61* 1.03* 1.67* 0.37*   No components found with this basename: POCBNP:3 No results found for this basename: DDIMER in the last 72 hours No results found for this basename: HGBA1C in the last 72 hours  Basename 10/27/12 0605    CHOL 144  HDL 43  LDLCALC 86  TRIG 76  CHOLHDL 3.3    Basename 10/26/12 1841  TSH 1.195  T4TOTAL --  T3FREE --  THYROIDAB --   No results found for this basename: VITAMINB12,FOLATE,FERRITIN,TIBC,IRON,RETICCTPCT in the last 72 hours  Radiology/Studies:  Dg Chest 2 View  10/26/2012  *RADIOLOGY REPORT*  Clinical Data: Chest pain.  Hypertension.  CHEST - 2 VIEW  Comparison: None.  Findings: The cardiac silhouette is mildly enlarged.  No mediastinal or hilar masses or adenopathy noted.  The lungs are clear.  There is minor blunting of the posterior costophrenic sulcus on the left, which may reflect minimal pleural fluid.  No pneumothorax.  There are bridging osteophytes along the mid lower thoracic spine.  IMPRESSION: No acute cardiopulmonary disease.   Original Report Authenticated By: Amie Portland, M.D.    Cardiac Catheterization 10/27/12: Hemodynamic Findings:  Central aortic pressure: 173/85  Left ventricular pressure: 167/12/18  Angiographic Findings:  Left main: No obstructive disease.  Left Anterior Descending Artery: Large caliber vessel with severe calcification in the proximal and mid vessel. There appears to be diffuse 30% stenosis in  the proximal and mid vessel. The distal vessel tapers to a small caliber vessel and has diffuse 60% stenosis.  Circumflex Artery: Large caliber vessel with 30% proximal stenosis. The first obtuse marginal branch is small in caliber and has mild disease. The obtuse marginal branch is a moderate sized bifurcating vessel with 40% stenosis in the inferior branch.  Right Coronary Artery: Large dominant artery with 50-60% mid stenosis just beyond the tight upward turn in the vessel. This appears to be an eccentric, calcified stenosis. The remainder of the mid and distal vessel has mild plaque.  Left Ventricular Angiogram: LVEF=55-60%  Impression:  1. Double vessel CAD  2. Moderate stenosis RCA, appears to be an eccentric calcified stenosis. This does  not appear to be flow limiting. I attempted IVUS to better visualize but due to tortuous anatomy and poor guide support from the radial approach, I was unable to pass the IVUS catheter around the severe bend in the vessel. The reports in the chart from cath in 2000 indicate a 60% stenosis in the RCA suggesting that this is a stable lesion.  3. Chest pain and slight elevation of troponin in the setting of atrial fibrillation  Recommendations: He will need further evaluation of atrial fibrillation. He is in sinus now. Would medically manage CAD. Aggressive risk factor modification.  Complications: None. The patient tolerated the procedure well.    Assessment and Plan:  Non-STEMI  -  No culprit lesion by cardiac cath.  Med Rx is recommended.  It was suspected patient had AFib upon being hooked up to tele in ED, but no documented AFib on ECGs or Tele since.  ? If NSTEMI demand ischemia from AFib with RVR.  No clear evidence he had this.  CHADS2 score is 4.  He would require coumadin or novel anticoagulant if AFib documented.  Continue current Rx.  Plan outpatient monitor.  Suspect he can be d/c after seen by attending physician.  Plan follow up with Dr. Dietrich Pates after monitor complete in 3-4 weeks.  Coronary Artery Disease  -   Plan Med Rx.  Continue ASA, Plavix, statin  Diabetes Mellitus   -   Continue home regimen.  Hypertension  -  Controlled.  Continue current therapy.   Prior Stroke  -   Continue ASA and Plavix.  Cholelithiasis  -  Abd [place] mentioned in admit notes. He had significant nausea and "dry heaving."  States this felt like prior "GB attack."  Check LFTs.  If ALP and transaminases ok, he can see his PCP next week to get outpatient ultrasound.    Hypokalemia  -   K+ replaced yesterday.  Check follow up BMET prior to d/c.  Signed, Tereso Newcomer, PA-C  10:16 AM 10/28/2012   Patient examined and agree except changes made. Awaiting LFTs. Hopefully home this PM.  Valera [place],  MD 10/28/2012 11:42 AM

## 2012-10-30 MED FILL — Dextrose Inj 5%: INTRAVENOUS | Qty: 50 | Status: AC

## 2012-10-31 ENCOUNTER — Other Ambulatory Visit: Payer: Self-pay

## 2012-10-31 ENCOUNTER — Other Ambulatory Visit: Payer: Self-pay | Admitting: *Deleted

## 2012-10-31 DIAGNOSIS — E875 Hyperkalemia: Secondary | ICD-10-CM

## 2012-10-31 DIAGNOSIS — I10 Essential (primary) hypertension: Secondary | ICD-10-CM

## 2012-11-01 ENCOUNTER — Other Ambulatory Visit (INDEPENDENT_AMBULATORY_CARE_PROVIDER_SITE_OTHER): Payer: Medicare Other

## 2012-11-01 DIAGNOSIS — E875 Hyperkalemia: Secondary | ICD-10-CM | POA: Diagnosis not present

## 2012-11-01 DIAGNOSIS — I10 Essential (primary) hypertension: Secondary | ICD-10-CM | POA: Diagnosis not present

## 2012-11-01 LAB — BASIC METABOLIC PANEL
GFR: 94.39 mL/min (ref >60.00)
Potassium: 5.1 mEq/L (ref 3.5–5.1)
Sodium: 136 mEq/L (ref 135–145)

## 2012-11-03 ENCOUNTER — Encounter (INDEPENDENT_AMBULATORY_CARE_PROVIDER_SITE_OTHER): Payer: Medicare Other

## 2012-11-03 DIAGNOSIS — R079 Chest pain, unspecified: Secondary | ICD-10-CM | POA: Diagnosis not present

## 2012-11-03 DIAGNOSIS — I4891 Unspecified atrial fibrillation: Secondary | ICD-10-CM | POA: Diagnosis not present

## 2012-11-03 NOTE — Progress Notes (Signed)
Patient ID: Charles [name], male   DOB: 24-Jul-1941, 71 y.o.   MRN: [id number] Place  An event monitor on patient 11/03/12 will send back to company on 12/03/12

## 2012-11-06 ENCOUNTER — Other Ambulatory Visit: Payer: Self-pay | Admitting: *Deleted

## 2012-11-06 DIAGNOSIS — I4891 Unspecified atrial fibrillation: Secondary | ICD-10-CM

## 2012-11-07 ENCOUNTER — Other Ambulatory Visit: Payer: Self-pay

## 2012-11-07 DIAGNOSIS — I4891 Unspecified atrial fibrillation: Secondary | ICD-10-CM

## 2012-11-07 NOTE — Progress Notes (Signed)
Patient ID: Charles [name], male   DOB: [birth year], 71 y.o.   MRN: [id number] Placed a event monitor on 11/03/12 and will end 12/03/12. I reviewed the instructions on how to use the monitor and when to return it

## 2012-11-14 ENCOUNTER — Telehealth: Payer: Self-pay | Admitting: *Deleted

## 2012-11-14 MED ORDER — RIVAROXABAN 20 MG PO TABS: 20.0000 mg | ORAL_TABLET | Freq: Every day | ORAL | Status: DC

## 2012-11-14 NOTE — Telephone Encounter (Signed)
Dr.Ross spoke with patient and his wife concerning atrial fib/flutter rate of 160 found on monitor from 11/05/2012. She advised to stop Plavix,continue ASA and start Xarelto 20mg  every day. Samples left at front desk and and mail away script sent.

## 2012-11-16 ENCOUNTER — Other Ambulatory Visit: Payer: Self-pay | Admitting: *Deleted

## 2012-11-16 MED ORDER — METOPROLOL TARTRATE 50 MG PO TABS: 50.0000 mg | ORAL_TABLET | Freq: Two times a day (BID) | ORAL | Status: DC

## 2012-11-16 MED ORDER — GABAPENTIN 300 MG PO CAPS: 300.0000 mg | ORAL_CAPSULE | Freq: Three times a day (TID) | ORAL | Status: DC | PRN

## 2012-11-16 MED ORDER — GLIMEPIRIDE 4 MG PO TABS: 4.0000 mg | ORAL_TABLET | Freq: Two times a day (BID) | ORAL | Status: DC

## 2012-11-16 MED ORDER — PIOGLITAZONE HCL 45 MG PO TABS: 45.0000 mg | ORAL_TABLET | Freq: Every day | ORAL | Status: DC

## 2012-12-11 ENCOUNTER — Encounter: Payer: Self-pay | Admitting: Internal Medicine

## 2012-12-11 ENCOUNTER — Ambulatory Visit (INDEPENDENT_AMBULATORY_CARE_PROVIDER_SITE_OTHER): Payer: Medicare Other | Admitting: Internal Medicine

## 2012-12-11 VITALS — BP 153/78 | HR 78 | Ht 76.0 in | Wt 377.0 lb

## 2012-12-11 DIAGNOSIS — I4891 Unspecified atrial fibrillation: Secondary | ICD-10-CM

## 2012-12-11 NOTE — Progress Notes (Signed)
HPI Patient is a 71 yo with a history of CAD (MI in 2000), HTN, CVA, HL  Admitted with N/V and SSCP with palpitataoins.  Found to be in afib.  Trop were elevated  Cath was done that showed moderate CAD but no culprit lesion.  Plan for medical Rx. After d/c he had an event monitor placed  This showed atrial fibrillation.  I contacted the patient and he is now on coumadin Rx. Since seen he said the only times he has had palpitations. Is when he forgot to take his metoprolol.  Not at other times Breathing is Ok.  No CP   No Known Allergies  Current Outpatient Prescriptions  Medication Sig Dispense Refill  . acetaminophen (TYLENOL) 500 MG tablet Take 1,000 mg by mouth at bedtime.      Marland Kitchen aspirin 81 MG tablet Take 81 mg by mouth daily.        . Cholecalciferol (EQL VITAMIN D3) 1000 UNITS tablet Take 1,000 Units by mouth daily.        . furosemide (LASIX) 80 MG tablet Take 80 mg by mouth daily as needed. For fluid retention      . gabapentin (NEURONTIN) 300 MG capsule Take 1 capsule (300 mg total) by mouth 3 (three) times daily as needed. For nerve pain  270 capsule  2  . glimepiride (AMARYL) 4 MG tablet Take 1 tablet (4 mg total) by mouth 2 (two) times daily.  180 tablet  2  . insulin lispro (HUMALOG) 100 UNIT/ML injection 5-20 Units by Subconjunctival route 3 (three) times daily. Per sliding scale      . losartan (COZAAR) 100 MG tablet Take 100 mg by mouth daily.      . metoprolol (LOPRESSOR) 50 MG tablet Take 1 tablet (50 mg total) by mouth 2 (two) times daily.  180 tablet  2  . nitroGLYCERIN (NITROSTAT) 0.4 MG SL tablet Place 0.4 mg under the tongue every 5 (five) minutes as needed. For chest pain      . pioglitazone (ACTOS) 45 MG tablet Take 1 tablet (45 mg total) by mouth daily.  90 tablet  2  . pravastatin (PRAVACHOL) 40 MG tablet Take 40 mg by mouth daily.      . Rivaroxaban (XARELTO) 20 MG TABS Take 1 tablet (20 mg total) by mouth daily.  30 tablet  3  . vitamin B-12 (CYANOCOBALAMIN) 1000  MCG tablet Take 1,000 mcg by mouth daily.        . Blood Glucose Monitoring Suppl (BLOOD GLUCOSE METER) kit Use as instructed  1 each  0    Past Medical History  Diagnosis Date  . CAD (coronary artery disease)     a. Approx. 2000 - MI. Cath showed single vessel disease, PTCA dLAD/medical management. ;  b. NSTEMI 11/13 => LHC: prox and mid LAD 30%, dLAD 60%, pCFX 30%, inf branch of OM 40%, mRCA 50-60%, EF 55-60%; IVUS attempted for RCA but not successful; anatomy felt stable from 2000 => med Rx.  . Diverticulitis   . Hypertension   . Obesity   . CVA (cerebral infarction)     a. Approx. 2002  . Cholelithiasis   . Hypercholesterolemia   . ED (erectile dysfunction)   . OSA on CPAP   . Type II diabetes mellitus   . History of stomach ulcers     "issues a long long time ago" (10/26/2012)  . History of MRSA infection     "little toe right foot" (10/26/2012)  .  Tunnel vision     "both eyes since stroke"  . 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 Claustrophobia   . B12 deficiency   . Diabetic neuropathy     Past Surgical History  Procedure Date  . Toe amputation 2006; 2009    "Dr. Ralene Cork; big toe left foot; little toe on right foot" (10/26/2012)  . Cardiac catheterization 1990's  . Cerebral angiogram ~ 2000    Family History  Problem Relation Age of Onset  . Asthma Neg Hx   . Cancer Mother 29    breast  . Heart disease Father 75    heart    History   Social History  . Marital Status: Married    Spouse Name: N/A    Number of Children: N/A  . Years of Education: N/A   Occupational History  . Not on file.   Social History Main Topics  . Smoking status: Light Tobacco Smoker -- 1.0 packs/day for 30 years    Types: Cigarettes, Cigars  . Smokeless tobacco: Current User    Types: Snuff     Comment: 10/26/2012 "quit smoking cigarettes  in ~ 1996; wife smokes in the house"  . Alcohol Use: Yes     Comment: 10/26/2012 "have  a drink maybe once/yr"  . Drug Use: No  . Sexually Active: Yes   Other Topics Concern  . Not on file   Social History Narrative   Disabled s/p CVA    Review of Systems:  All systems reviewed.  They are negative to the above problem except as previously stated.  Vital Signs: BP 153/78  Pulse 78  Ht 6\' 4"  (1.93 m)  Wt 377 lb (171.006 kg)  BMI 45.89 kg/m2  SpO2 99%  Physical Exam  Patient is in NAd HEENT:  Normocephalic, atraumatic. EOMI, PERRLA.  Neck: JVP is normal.  No bruits.  Lungs: clear to auscultation. No rales no wheezes.  Heart: Regular rate and rhythm. Normal S1, S2. No S3.   No significant murmurs. PMI not displaced.  Abdomen:  Supple, nontender. Normal bowel sounds. No masses. No hepatomegaly.  Extremities:   Good distal pulses throughout. No lower extremity edema.  Musculoskeletal :moving all extremities.  Neuro:   alert and oriented x3.  CN II-XII grossly intact.   Assessment and Plan:  1.  Atrial fibrillation.  Paroxysmal.  i would keep on current regimen.  Will set up for [facility] January.  2  CAD  Moderate by recnet cath.  Keep on medical Rx.  Encouraged him to stay active an lose wt.  2.  HTN  BP is a little high today.  Will need to follow.  4.  HL  Keep on pravachol.  F/u in [place].

## 2012-12-25 ENCOUNTER — Telehealth: Payer: Self-pay | Admitting: Internal Medicine

## 2012-12-25 DIAGNOSIS — E119 Type 2 diabetes mellitus without complications: Secondary | ICD-10-CM

## 2012-12-25 DIAGNOSIS — E7800 Pure hypercholesterolemia, unspecified: Secondary | ICD-10-CM

## 2012-12-25 DIAGNOSIS — I10 Essential (primary) hypertension: Secondary | ICD-10-CM

## 2012-12-25 DIAGNOSIS — E1142 Type 2 diabetes mellitus with diabetic polyneuropathy: Secondary | ICD-10-CM

## 2012-12-25 NOTE — Telephone Encounter (Signed)
Pt's wife called to set up and appt for later this month.  She wants to know if he needs diabetic labs prior.

## 2012-12-26 NOTE — Telephone Encounter (Signed)
Labs entered.

## 2012-12-26 NOTE — Telephone Encounter (Signed)
Wife is aware

## 2012-12-26 NOTE — Telephone Encounter (Signed)
Ok lipids, BMET, A1c Thx 

## 2012-12-27 NOTE — Telephone Encounter (Signed)
I cant order labs prior since pt has Medicare. Pt's wife informed.

## 2013-01-03 DIAGNOSIS — L608 Other nail disorders: Secondary | ICD-10-CM | POA: Diagnosis not present

## 2013-01-03 DIAGNOSIS — E1049 Type 1 diabetes mellitus with other diabetic neurological complication: Secondary | ICD-10-CM | POA: Diagnosis not present

## 2013-01-08 ENCOUNTER — Other Ambulatory Visit (INDEPENDENT_AMBULATORY_CARE_PROVIDER_SITE_OTHER): Payer: Medicare Other

## 2013-01-08 ENCOUNTER — Other Ambulatory Visit: Payer: Self-pay | Admitting: Internal Medicine

## 2013-01-08 ENCOUNTER — Encounter: Payer: Self-pay | Admitting: Internal Medicine

## 2013-01-08 ENCOUNTER — Ambulatory Visit (INDEPENDENT_AMBULATORY_CARE_PROVIDER_SITE_OTHER): Payer: Medicare Other | Admitting: Internal Medicine

## 2013-01-08 VITALS — BP 173/78 | HR 80 | Temp 98.1°F | Resp 16 | Wt 379.0 lb

## 2013-01-08 DIAGNOSIS — E1149 Type 2 diabetes mellitus with other diabetic neurological complication: Secondary | ICD-10-CM

## 2013-01-08 DIAGNOSIS — E11319 Type 2 diabetes mellitus with unspecified diabetic retinopathy without macular edema: Secondary | ICD-10-CM

## 2013-01-08 DIAGNOSIS — I10 Essential (primary) hypertension: Secondary | ICD-10-CM

## 2013-01-08 DIAGNOSIS — R739 Hyperglycemia, unspecified: Secondary | ICD-10-CM

## 2013-01-08 DIAGNOSIS — I252 Old myocardial infarction: Secondary | ICD-10-CM | POA: Diagnosis not present

## 2013-01-08 DIAGNOSIS — I2510 Atherosclerotic heart disease of native coronary artery without angina pectoris: Secondary | ICD-10-CM

## 2013-01-08 DIAGNOSIS — R7309 Other abnormal glucose: Secondary | ICD-10-CM

## 2013-01-08 DIAGNOSIS — I4891 Unspecified atrial fibrillation: Secondary | ICD-10-CM

## 2013-01-08 DIAGNOSIS — E538 Deficiency of other specified B group vitamins: Secondary | ICD-10-CM | POA: Diagnosis not present

## 2013-01-08 DIAGNOSIS — E119 Type 2 diabetes mellitus without complications: Secondary | ICD-10-CM | POA: Diagnosis not present

## 2013-01-08 DIAGNOSIS — E1139 Type 2 diabetes mellitus with other diabetic ophthalmic complication: Secondary | ICD-10-CM

## 2013-01-08 DIAGNOSIS — K8020 Calculus of gallbladder without cholecystitis without obstruction: Secondary | ICD-10-CM | POA: Insufficient documentation

## 2013-01-08 LAB — HEPATIC FUNCTION PANEL
AST: 21 U/L (ref 0–37)
Alkaline Phosphatase: 69 U/L (ref 39–117)
Total Bilirubin: 0.6 mg/dL (ref 0.3–1.2)

## 2013-01-08 LAB — CBC WITH DIFFERENTIAL/PLATELET
Basophils Relative: 0.3 % (ref 0.0–3.0)
Eosinophils Relative: 2.2 % (ref 0.0–5.0)
HCT: 39 % (ref 39.0–52.0)
Hemoglobin: 13.1 g/dL (ref 13.0–17.0)
Lymphs Abs: 1 10*3/uL (ref 0.7–4.0)
MCV: 91.8 fl (ref 78.0–100.0)
Monocytes Absolute: 0.5 10*3/uL (ref 0.1–1.0)
Neutro Abs: 6 10*3/uL (ref 1.4–7.7)
Platelets: 298 10*3/uL (ref 150.0–400.0)
WBC: 7.8 10*3/uL (ref 4.5–10.5)

## 2013-01-08 LAB — VITAMIN B12: Vitamin B-12: 825 pg/mL (ref 211–911)

## 2013-01-08 LAB — BASIC METABOLIC PANEL
CO2: 32 mEq/L (ref 19–32)
Calcium: 9.1 mg/dL (ref 8.4–10.5)
Chloride: 101 mEq/L (ref 96–112)
Sodium: 139 mEq/L (ref 135–145)

## 2013-01-08 LAB — HEMOGLOBIN A1C: Hgb A1c MFr Bld: 6.7 % — ABNORMAL HIGH (ref 4.6–6.5)

## 2013-01-08 NOTE — Assessment & Plan Note (Signed)
Continue with current prescription therapy as reflected on the Med list.  

## 2013-01-08 NOTE — Assessment & Plan Note (Addendum)
2000 Angioplasty 2014 cath Dr Tenny Craw

## 2013-01-08 NOTE — Assessment & Plan Note (Signed)
Wt Readings from Last 3 Encounters:  01/08/13 379 lb (171.913 kg)  12/11/12 377 lb (171.006 kg)  10/28/12 370 lb 13 oz (168.2 kg)

## 2013-01-08 NOTE — Assessment & Plan Note (Signed)
He asked for a surg cons and to discuss LapBand

## 2013-01-08 NOTE — Progress Notes (Signed)
   Subjective:   HPI  The patient presents for a follow-up of  chronic hypertension, chronic dyslipidemia, type 2 diabetes controlled with medicines. F/u neuropathy  BP Readings from Last 3 Encounters:  01/08/13 173/78  12/11/12 153/78  10/28/12 133/44   Wt Readings from Last 3 Encounters:  01/08/13 379 lb (171.913 kg)  12/11/12 377 lb (171.006 kg)  10/28/12 370 lb 13 oz (168.2 kg)     Review of Systems  Constitutional: Negative for appetite change, fatigue and unexpected weight change.  HENT: Negative for nosebleeds, congestion, sore throat, sneezing, trouble swallowing and neck pain.   Eyes: Negative for itching and visual disturbance.  Respiratory: Negative for cough.   Cardiovascular: Negative for chest pain, palpitations and leg swelling.  Gastrointestinal: Negative for nausea, diarrhea, blood in stool and abdominal distention.  Genitourinary: Negative for frequency and hematuria.  Musculoskeletal: Negative for back pain, joint swelling and gait problem.  Skin: Negative for rash.  Neurological: Negative for dizziness, tremors, speech difficulty and weakness.  Psychiatric/Behavioral: Negative for sleep disturbance, dysphoric mood and agitation. The patient is not nervous/anxious.        Objective:   Physical Exam  Constitutional: He is oriented to person, place, and time. He appears well-developed.       obese  HENT:  Mouth/Throat: Oropharynx is clear and moist.       Hard hearing  Eyes: Conjunctivae normal are normal. Pupils are equal, round, and reactive to light.  Neck: Normal range of motion. No JVD present. No thyromegaly present.  Cardiovascular: Normal rate, regular rhythm, normal heart sounds and intact distal pulses.  Exam reveals no gallop and no friction rub.   No murmur heard. Pulmonary/Chest: Effort normal and breath sounds normal. No respiratory distress. He has no wheezes. He has no rales. He exhibits no tenderness.  Abdominal: Soft. Bowel sounds are  normal. He exhibits no distension and no mass. There is no tenderness. There is no rebound and no guarding.  Musculoskeletal: Normal range of motion. He exhibits edema (trace to 1+ B ankles). He exhibits no tenderness.  Lymphadenopathy:    He has no cervical adenopathy.  Neurological: He is alert and oriented to person, place, and time. He has normal reflexes. No cranial nerve deficit. He exhibits normal muscle tone. Coordination normal.  Skin: Skin is warm and dry. No rash noted.  Psychiatric: He has a normal mood and affect. His behavior is normal. Judgment and thought content normal.    Lab Results  Component Value Date   WBC 7.3 10/28/2012   HGB 11.3* 10/28/2012   HCT 35.2* 10/28/2012   PLT 237 10/28/2012   GLUCOSE 217* 11/01/2012   CHOL 144 10/27/2012   TRIG 76 10/27/2012   HDL 43 10/27/2012   LDLCALC 86 10/27/2012   ALT 59* 10/28/2012   AST 27 10/28/2012   NA 136 11/01/2012   K 5.1 11/01/2012   CL 100 11/01/2012   CREATININE 0.9 11/01/2012   BUN 10 11/01/2012   CO2 32 11/01/2012   TSH 1.195 10/26/2012   PSA 0.96 08/28/2010   INR 1.13 10/27/2012   HGBA1C 6.5 05/16/2012   MICROALBUR 1.4 08/28/2010    [place] w/GS's     Assessment & Plan:

## 2013-01-09 ENCOUNTER — Telehealth: Payer: Self-pay | Admitting: Internal Medicine

## 2013-01-09 NOTE — Telephone Encounter (Signed)
Stacey, please, inform patient that all labs are ok Thx 

## 2013-01-09 NOTE — Telephone Encounter (Signed)
Pt informed

## 2013-01-11 ENCOUNTER — Other Ambulatory Visit: Payer: Medicare Other

## 2013-01-15 ENCOUNTER — Encounter (HOSPITAL_COMMUNITY): Payer: Self-pay | Admitting: *Deleted

## 2013-01-25 ENCOUNTER — Ambulatory Visit (INDEPENDENT_AMBULATORY_CARE_PROVIDER_SITE_OTHER): Payer: Self-pay | Admitting: General Surgery

## 2013-01-31 ENCOUNTER — Ambulatory Visit (INDEPENDENT_AMBULATORY_CARE_PROVIDER_SITE_OTHER): Payer: Medicare Other | Admitting: General Surgery

## 2013-01-31 ENCOUNTER — Encounter (INDEPENDENT_AMBULATORY_CARE_PROVIDER_SITE_OTHER): Payer: Self-pay | Admitting: General Surgery

## 2013-01-31 VITALS — BP 142/81 | HR 62 | Temp 97.1°F | Resp 14 | Ht 74.0 in | Wt 381.6 lb

## 2013-01-31 DIAGNOSIS — K8020 Calculus of gallbladder without cholecystitis without obstruction: Secondary | ICD-10-CM | POA: Diagnosis not present

## 2013-01-31 NOTE — Progress Notes (Signed)
Patient ID: Charles [name], male   DOB: May 02, 1941, 72 y.o.   MRN: [id number]  Chief Complaint  Patient presents with  . Abdominal Pain    Charles [name] is a 72 y.o. male.  This patient is referred by Dr. Posey Rea  For evaluation of cholelithiasis and for possible consideration of lap band weight loss surgery. He had a long-standing history of gallstones first discovered on ultrasound in 2000 and where he had some stones and sludge in the gallbladder without any evidence of cholecystitis. He first had a a "attack" about 4 years ago of an he has had a total of 2 episodes of gallbladder attacks which have given him to the emergency room. His most recent episode was in November of 2013 which was accompanied with so much nausea and vomiting that he says caused him to go into atrial fibrillation and at that time he was started on Xarelto.  He says that this is brought on by certain cooking oils, corn chips, barbecue, inserted the office office. He says that this occurs only very infrequently and is not lifestyle limiting. He describes his attacks as epigastric "pressure" which lasted a few hours without any associated fevers or chills. He denies any history of heartburn or any recent history of peptic disease although he didn't have a history of ulcers several years ago. He also inquires about possible weight loss surgery specifically, the lap band Charles  Past Medical History  Diagnosis Date  . CAD (coronary artery disease)     a. Approx. 2000 - MI. Cath showed single vessel disease, PTCA dLAD/medical management. ;  b. NSTEMI 11/13 => LHC: prox and mid LAD 30%, dLAD 60%, pCFX 30%, inf branch of OM 40%, mRCA 50-60%, EF 55-60%; IVUS attempted for RCA but not successful; anatomy felt stable from 2000 => med Rx.  . Diverticulitis   . Hypertension   . Obesity   . CVA (cerebral infarction)     a. Approx. 2002  . Cholelithiasis   . Hypercholesterolemia   . ED (erectile dysfunction)   . OSA on CPAP   .  Type II diabetes mellitus   . History of stomach ulcers     "issues a long long time ago" (10/26/2012)  . History of MRSA infection     "little toe right foot" (10/26/2012)  . Tunnel vision     "both eyes since stroke"  . 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 Claustrophobia   . B12 deficiency   . Diabetic neuropathy     Past Surgical History  Procedure Laterality Date  . Toe amputation  2006; 2009    "Dr. Ralene Cork; big toe left foot; little toe on right foot" (10/26/2012)  . Cardiac catheterization  1990's  . Cerebral angiogram  ~ 2000    Family History  Problem Relation Age of Onset  . Asthma Neg Hx   . Cancer Mother 38    breast  . Heart disease Father 74    heart    Social History History  Substance Use Topics  . Smoking status: Light Tobacco Smoker -- 1.00 packs/day for 30 years    Types: Cigarettes, Cigars  . Smokeless tobacco: Current User    Types: Snuff     Comment: 10/26/2012 "quit smoking cigarettes  in ~ 1996; wife smokes in the house"  . Alcohol Use: Yes     Comment: 10/26/2012 "have a drink maybe once/yr"  No Known Allergies  Current Outpatient Prescriptions  Medication Sig Dispense Refill  . acetaminophen (TYLENOL) 500 MG tablet Take 1,000 mg by mouth at bedtime.      Marland Kitchen aspirin 81 MG tablet Take 81 mg by mouth daily.        . Cholecalciferol (EQL VITAMIN D3) 1000 UNITS tablet Take 1,000 Units by mouth daily.        . furosemide (LASIX) 80 MG tablet Take 80 mg by mouth daily as needed. For fluid retention      . gabapentin (NEURONTIN) 300 MG capsule Take 1 capsule (300 mg total) by mouth 3 (three) times daily as needed. For nerve pain  270 capsule  2  . glimepiride (AMARYL) 4 MG tablet Take 1 tablet (4 mg total) by mouth 2 (two) times daily.  180 tablet  2  . insulin lispro (HUMALOG) 100 UNIT/ML injection 5-20 Units by Subconjunctival route 3 (three) times daily. Per sliding scale      . losartan  (COZAAR) 100 MG tablet Take 100 mg by mouth daily.      . metoprolol (LOPRESSOR) 50 MG tablet Take 1 tablet (50 mg total) by mouth 2 (two) times daily.  180 tablet  2  . nitroGLYCERIN (NITROSTAT) 0.4 MG SL tablet Place 0.4 mg under the tongue every 5 (five) minutes as needed. For chest pain      . pioglitazone (ACTOS) 45 MG tablet Take 1 tablet (45 mg total) by mouth daily.  90 tablet  2  . pravastatin (PRAVACHOL) 40 MG tablet Take 40 mg by mouth daily.      . Rivaroxaban (XARELTO) 20 MG TABS Take 1 tablet (20 mg total) by mouth daily.  30 tablet  3  . vitamin B-12 (CYANOCOBALAMIN) 1000 MCG tablet Take 1,000 mcg by mouth daily.        . Blood Glucose Monitoring Suppl (BLOOD GLUCOSE METER) kit Use as instructed       No current facility-administered medications for this visit.    Review of Systems Review of Systems All other review of systems negative or noncontributory except as stated in the Charles  Blood pressure 142/81, pulse 62, temperature 97.1 F (36.2 C), temperature source Temporal, resp. rate 14, height 6\' 2"  (1.88 m), weight 381 lb 9.6 oz (173.093 kg).  Physical Exam Physical Exam Physical Exam  Vitals reviewed. Constitutional: He is oriented to person, place, and time. He appears well-developed and well-nourished. No distress.  HENT:  Head: Normocephalic and atraumatic.  Mouth/Throat: No oropharyngeal exudate.  Eyes: Conjunctivae and EOM are normal. Pupils are equal, round, and reactive to light. Right eye exhibits no discharge. Left eye exhibits no discharge. No scleral icterus.  Neck: Normal range of motion. No tracheal deviation present.  Cardiovascular: Normal rate, sounded regular today and normal heart sounds.   Pulmonary/Chest: Effort normal and breath sounds normal. No stridor. No respiratory distress. He has no wheezes. He has no rales. He exhibits no tenderness.  Abdominal: Soft. Bowel sounds are normal. He exhibits no distension and no mass. There is no tenderness.  There is no rebound and no guarding.  Musculoskeletal: Normal range of motion. He exhibits no edema and no tenderness.  Neurological: He is alert and oriented to person, place, and time.  Skin: Skin is warm and dry. No rash noted. He is not diaphoretic. No erythema. No pallor.  Psychiatric: He has a normal mood and affect. His behavior is normal. Judgment and thought content normal.    Data Reviewed [place]  Assessment    Symptomatic cholelithiasis and morbid obesity He does have what sounds to be symptomatic cholelithiasis. However, he has infrequent episodes of the attacks and this does not appear to be lifestyle limiting. We discussed the natural history of cholelithiasis as well as the options for surgery versus continued observation. I did offer surgery to remove his gallbladder if this is lifestyle limiting but it sounds as though he is not really having much difficulty with this and he is not particularly interested in cholecystectomy at this time. We did discuss the possibility of cholecystitis and the need for more emergent surgery which  Would certainly not be ideal in his situation with his cardiac history as well as his current use of anticoagulation. Morbid obesity Though he technically meets the qualifications for weight loss surgery from a BMI and comorbidities standpoint, I do not know that I would really recommended it for him.  At his age and with his comorbidities, I think any of the more extensive stapling procedures such as sleeve gastrectomy or Roux-en-Y gastric bypass would be too risky and he would not tolerate well any complications from this. Also, I'm not sure that he would get any significant improvement with a lap band especially given his relative lack of mobility  and inability 2 exercise. Also, his comorbidities would not likely be reversed with the lap band surgery, therefore, I do not think the risk/benefit profile would favor him.  Bottom line, I do not think that we  would extend his life any with weight loss surgery at this point.    Plan    I would be happy to offer him cholecystectomy for relief of his symptoms if he continues to have discomfort with this becomes lifestyle limiting, or if he desires.        Lodema Pilot DAVID 01/31/2013, 12:25 PM

## 2013-02-28 ENCOUNTER — Other Ambulatory Visit: Payer: Self-pay | Admitting: Internal Medicine

## 2013-03-02 ENCOUNTER — Other Ambulatory Visit: Payer: Self-pay | Admitting: *Deleted

## 2013-03-02 MED ORDER — PRAVASTATIN SODIUM 40 MG PO TABS: 40.0000 mg | ORAL_TABLET | Freq: Every day | ORAL | Status: DC

## 2013-03-30 ENCOUNTER — Other Ambulatory Visit: Payer: Self-pay | Admitting: Internal Medicine

## 2013-03-30 ENCOUNTER — Other Ambulatory Visit (INDEPENDENT_AMBULATORY_CARE_PROVIDER_SITE_OTHER): Payer: Medicare Other

## 2013-03-30 DIAGNOSIS — E7800 Pure hypercholesterolemia, unspecified: Secondary | ICD-10-CM

## 2013-03-30 DIAGNOSIS — I10 Essential (primary) hypertension: Secondary | ICD-10-CM | POA: Diagnosis not present

## 2013-03-30 DIAGNOSIS — I2510 Atherosclerotic heart disease of native coronary artery without angina pectoris: Secondary | ICD-10-CM

## 2013-03-30 DIAGNOSIS — E119 Type 2 diabetes mellitus without complications: Secondary | ICD-10-CM

## 2013-03-30 LAB — BASIC METABOLIC PANEL
BUN: 13 mg/dL (ref 6–23)
CO2: 29 mEq/L (ref 19–32)
Calcium: 9.1 mg/dL (ref 8.4–10.5)
Creatinine, Ser: 0.9 mg/dL (ref 0.4–1.5)
GFR: 88.26 mL/min (ref >60.00)
Glucose, Bld: 139 mg/dL — ABNORMAL HIGH (ref 70–99)
Sodium: 137 mEq/L (ref 135–145)

## 2013-03-30 LAB — HEPATIC FUNCTION PANEL
ALT: 19 U/L (ref 0–53)
Albumin: 3.8 g/dL (ref 3.5–5.2)
Total Protein: 7 g/dL (ref 6.0–8.3)

## 2013-04-04 DIAGNOSIS — L608 Other nail disorders: Secondary | ICD-10-CM | POA: Diagnosis not present

## 2013-04-04 DIAGNOSIS — E1149 Type 2 diabetes mellitus with other diabetic neurological complication: Secondary | ICD-10-CM | POA: Diagnosis not present

## 2013-04-06 ENCOUNTER — Encounter: Payer: Self-pay | Admitting: Internal Medicine

## 2013-04-06 ENCOUNTER — Ambulatory Visit (INDEPENDENT_AMBULATORY_CARE_PROVIDER_SITE_OTHER): Payer: Medicare Other | Admitting: Internal Medicine

## 2013-04-06 VITALS — BP 130/78 | HR 80 | Temp 98.0°F | Resp 16 | Wt 363.0 lb

## 2013-04-06 DIAGNOSIS — M79609 Pain in unspecified limb: Secondary | ICD-10-CM

## 2013-04-06 DIAGNOSIS — I6789 Other cerebrovascular disease: Secondary | ICD-10-CM | POA: Diagnosis not present

## 2013-04-06 DIAGNOSIS — M79641 Pain in right hand: Secondary | ICD-10-CM

## 2013-04-06 DIAGNOSIS — R635 Abnormal weight gain: Secondary | ICD-10-CM

## 2013-04-06 DIAGNOSIS — E538 Deficiency of other specified B group vitamins: Secondary | ICD-10-CM

## 2013-04-06 DIAGNOSIS — E119 Type 2 diabetes mellitus without complications: Secondary | ICD-10-CM | POA: Diagnosis not present

## 2013-04-06 DIAGNOSIS — E1149 Type 2 diabetes mellitus with other diabetic neurological complication: Secondary | ICD-10-CM

## 2013-04-06 MED ORDER — INSULIN LISPRO 100 UNIT/ML ~~LOC~~ SOLN: 5.0000 [IU] | Freq: Three times a day (TID) | SUBCUTANEOUS | Status: DC

## 2013-04-06 NOTE — Assessment & Plan Note (Signed)
4/14 overuse

## 2013-04-06 NOTE — Progress Notes (Signed)
   Subjective:   HPI  The patient presents for a follow-up of  chronic hypertension, chronic dyslipidemia, type 2 diabetes controlled with medicines. F/u neuropathy  BP Readings from Last 3 Encounters:  04/06/13 130/78  01/31/13 142/81  01/08/13 173/78   Wt Readings from Last 3 Encounters:  04/06/13 363 lb (164.656 kg)  01/31/13 381 lb 9.6 oz (173.093 kg)  01/08/13 379 lb (171.913 kg)     Review of Systems  Constitutional: Negative for appetite change, fatigue and unexpected weight change.  HENT: Negative for nosebleeds, congestion, sore throat, sneezing, trouble swallowing and neck pain.   Eyes: Negative for itching and visual disturbance.  Respiratory: Negative for cough.   Cardiovascular: Negative for chest pain, palpitations and leg swelling.  Gastrointestinal: Negative for nausea, diarrhea, blood in stool and abdominal distention.  Genitourinary: Negative for frequency and hematuria.  Musculoskeletal: Negative for back pain, joint swelling and gait problem.  Skin: Negative for rash.  Neurological: Negative for dizziness, tremors, speech difficulty and weakness.  Psychiatric/Behavioral: Negative for sleep disturbance, dysphoric mood and agitation. The patient is not nervous/anxious.        Objective:   Physical Exam  Constitutional: He is oriented to person, place, and time. He appears well-developed.  obese  HENT:  Mouth/Throat: Oropharynx is clear and moist.  Hard hearing  Eyes: Conjunctivae are normal. Pupils are equal, round, and reactive to light.  Neck: Normal range of motion. No JVD present. No thyromegaly present.  Cardiovascular: Normal rate, regular rhythm, normal heart sounds and intact distal pulses.  Exam reveals no gallop and no friction rub.   No murmur heard. Pulmonary/Chest: Effort normal and breath sounds normal. No respiratory distress. He has no wheezes. He has no rales. He exhibits no tenderness.  Abdominal: Soft. Bowel sounds are normal. He  exhibits no distension and no mass. There is no tenderness. There is no rebound and no guarding.  Musculoskeletal: Normal range of motion. He exhibits edema (trace to 1+ B ankles). He exhibits no tenderness.  Lymphadenopathy:    He has no cervical adenopathy.  Neurological: He is alert and oriented to person, place, and time. He has normal reflexes. No cranial nerve deficit. He exhibits normal muscle tone. Coordination normal.  Skin: Skin is warm and dry. No rash noted.  Psychiatric: He has a normal mood and affect. His behavior is normal. Judgment and thought content normal.  R st MCP2-3 tender, atrophic intraosseous muscles in 1-2 spaces R>L  Lab Results  Component Value Date   WBC 7.8 01/08/2013   HGB 13.1 01/08/2013   HCT 39.0 01/08/2013   PLT 298.0 01/08/2013   GLUCOSE 139* 03/30/2013   CHOL 144 10/27/2012   TRIG 76 10/27/2012   HDL 43 10/27/2012   LDLCALC 86 10/27/2012   ALT 19 03/30/2013   AST 27 03/30/2013   NA 137 03/30/2013   K 4.3 03/30/2013   CL 104 03/30/2013   CREATININE 0.9 03/30/2013   BUN 13 03/30/2013   CO2 29 03/30/2013   TSH 1.195 10/26/2012   PSA 0.96 08/28/2010   INR 1.13 10/27/2012   HGBA1C 6.5 03/30/2013   MICROALBUR 1.4 08/28/2010         Assessment & Plan:

## 2013-04-06 NOTE — Assessment & Plan Note (Signed)
Better  Wt Readings from Last 3 Encounters:  04/06/13 363 lb (164.656 kg)  01/31/13 381 lb 9.6 oz (173.093 kg)  01/08/13 379 lb (171.913 kg)

## 2013-04-06 NOTE — Assessment & Plan Note (Signed)
Continue with current prescription therapy as reflected on the Med list.  

## 2013-04-06 NOTE — Assessment & Plan Note (Signed)
Continue with current prescription therapy as reflected on the Med list. Lost wt

## 2013-04-24 DIAGNOSIS — L03039 Cellulitis of unspecified toe: Secondary | ICD-10-CM | POA: Diagnosis not present

## 2013-04-24 DIAGNOSIS — L02619 Cutaneous abscess of unspecified foot: Secondary | ICD-10-CM | POA: Diagnosis not present

## 2013-04-24 DIAGNOSIS — L97509 Non-pressure chronic ulcer of other part of unspecified foot with unspecified severity: Secondary | ICD-10-CM | POA: Diagnosis not present

## 2013-05-02 DIAGNOSIS — L97509 Non-pressure chronic ulcer of other part of unspecified foot with unspecified severity: Secondary | ICD-10-CM | POA: Diagnosis not present

## 2013-05-03 ENCOUNTER — Ambulatory Visit (INDEPENDENT_AMBULATORY_CARE_PROVIDER_SITE_OTHER): Payer: Medicare Other | Admitting: Internal Medicine

## 2013-05-03 ENCOUNTER — Encounter: Payer: Self-pay | Admitting: Internal Medicine

## 2013-05-03 VITALS — BP 142/72 | HR 65 | Ht 76.0 in | Wt 371.0 lb

## 2013-05-03 DIAGNOSIS — I2510 Atherosclerotic heart disease of native coronary artery without angina pectoris: Secondary | ICD-10-CM | POA: Diagnosis not present

## 2013-05-03 LAB — CBC WITH DIFFERENTIAL/PLATELET
Basophils Absolute: 0 10*3/uL (ref 0.0–0.1)
Hemoglobin: 12.1 g/dL — ABNORMAL LOW (ref 13.0–17.0)
Lymphocytes Relative: 16.9 % (ref 12.0–46.0)
Monocytes Relative: 7.1 % (ref 3.0–12.0)
Platelets: 264 10*3/uL (ref 150.0–400.0)
RDW: 15.6 % — ABNORMAL HIGH (ref 11.5–14.6)

## 2013-05-03 NOTE — Progress Notes (Signed)
HPI Patient is a 72 yo with a history of CAD (MI in 2000), HTN, CVA, HL Admitted with N/V and SSCP with palpitataoins. Found to be in afib. Trop were elevated Cath was done that showed moderate CAD but no culprit lesion. Plan for medical Rx.  After d/c he had an event monitor placed This showed atrial fibrillation. I placed him initially on coumadin  Later switched to Xarelto I saw him in clinic in December 2013.  Since then he has done well  He denies palpitaitons.  Breathing is OK  No dizziness  No CP.  No Known Allergies  Current Outpatient Prescriptions  Medication Sig Dispense Refill  . acetaminophen (TYLENOL) 500 MG tablet Take 1,000 mg by mouth at bedtime.      Marland Kitchen aspirin 81 MG tablet Take 81 mg by mouth daily.        . Blood Glucose Monitoring Suppl (BLOOD GLUCOSE METER) kit Use as instructed      . Cholecalciferol (EQL VITAMIN D3) 1000 UNITS tablet Take 1,000 Units by mouth daily.        . ciprofloxacin (CIPRO) 500 MG tablet       . doxycycline (VIBRAMYCIN) 100 MG capsule       . furosemide (LASIX) 80 MG tablet Take 80 mg by mouth daily as needed. For fluid retention      . gabapentin (NEURONTIN) 300 MG capsule Take 1 capsule (300 mg total) by mouth 3 (three) times daily as needed. For nerve pain  270 capsule  2  . glimepiride (AMARYL) 4 MG tablet Take 1 tablet (4 mg total) by mouth 2 (two) times daily.  180 tablet  2  . insulin lispro (HUMALOG) 100 UNIT/ML injection Inject 5-20 Units into the skin 3 (three) times daily. Per sliding scale  10 mL  11  . losartan (COZAAR) 100 MG tablet Take 100 mg by mouth daily.      . metoprolol (LOPRESSOR) 50 MG tablet Take 1 tablet (50 mg total) by mouth 2 (two) times daily.  180 tablet  2  . nitroGLYCERIN (NITROSTAT) 0.4 MG SL tablet Place 0.4 mg under the tongue every 5 (five) minutes as needed. For chest pain      . pioglitazone (ACTOS) 45 MG tablet Take 1 tablet (45 mg total) by mouth daily.  90 tablet  2  . pravastatin (PRAVACHOL) 40 MG tablet  Take 1 tablet (40 mg total) by mouth daily.  90 tablet  2  . vitamin B-12 (CYANOCOBALAMIN) 1000 MCG tablet Take 1,000 mcg by mouth daily.        Charles [name] 20 MG TABS TAKE 1 TABLET EVERY DAY  90 tablet  PRN   No current facility-administered medications for this visit.    Past Medical History  Diagnosis Date  . CAD (coronary artery disease)     a. Approx. 2000 - MI. Cath showed single vessel disease, PTCA dLAD/medical management. ;  b. NSTEMI 11/13 => LHC: prox and mid LAD 30%, dLAD 60%, pCFX 30%, inf branch of OM 40%, mRCA 50-60%, EF 55-60%; IVUS attempted for RCA but not successful; anatomy felt stable from 2000 => med Rx.  . Diverticulitis   . Hypertension   . Obesity   . CVA (cerebral infarction)     a. Approx. 2002  . Cholelithiasis   . Hypercholesterolemia   . ED (erectile dysfunction)   . OSA on CPAP   . Type II diabetes mellitus   . History of stomach ulcers     "  issues a long long time ago" (10/26/2012)  . History of MRSA infection     "little toe right foot" (10/26/2012)  . Tunnel vision     "both eyes since stroke"  . 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 Claustrophobia   . B12 deficiency   . Diabetic neuropathy     Past Surgical History  Procedure Laterality Date  . Toe amputation  2006; 2009    "Dr. Ralene Cork; big toe left foot; little toe on right foot" (10/26/2012)  . Cardiac catheterization  1990's  . Cerebral angiogram  ~ 2000    Family History  Problem Relation Age of Onset  . Asthma Neg Hx   . Cancer Mother 46    breast  . Heart disease Father 57    heart    History   Social History  . Marital Status: Married    Spouse Name: N/A    Number of Children: N/A  . Years of Education: N/A   Occupational History  . Not on file.   Social History Main Topics  . Smoking status: Light Tobacco Smoker -- 1.00 packs/day for 30 years    Types: Cigarettes, Cigars  . Smokeless tobacco: Current User     Types: Snuff     Comment: 10/26/2012 "quit smoking cigarettes  in ~ 1996; wife smokes in the house"  . Alcohol Use: Yes     Comment: 10/26/2012 "have a drink maybe once/yr"  . Drug Use: No  . Sexually Active: Yes   Other Topics Concern  . Not on file   Social History Narrative   Disabled s/p CVA    Review of Systems:  All systems reviewed.  They are negative to the above problem except as previously stated.  Vital Signs: BP 142/72  Pulse 65  Ht 6\' 4"  (1.93 m)  Wt 371 lb (168.284 kg)  BMI 45.18 kg/m2  Physical Exam Pt in NAD HEENT:  Normocephalic, atraumatic. EOMI, PERRLA.  Neck: JVP is normal.  No bruits.  Lungs: clear to auscultation. No rales no wheezes.  Heart: Regular rate and rhythm. Normal S1, S2. No S3.   No significant murmurs. PMI not displaced.  Abdomen:  Supple, nontender. Normal bowel sounds. No masses. No hepatomegaly.  Extremities:   Good distal pulses throughout. No lower extremity edema.  Musculoskeletal :moving all extremities.  Neuro:   alert and oriented x3.  CN II-XII grossly intact.  EKG  SR  65 bpm    Assessment and Plan:  1.  Atrial fibrillaiton.  Currently in SR.  I would keep on same regimen.  WIll have him check price of pradaxa for copay  Could switch.  Does not want coumadin  Check CBC  2.  CAD  Asymptomatic  3.  HTN  Adeaquate control  4.  HL  LIpid in November LDL was 83

## 2013-05-03 NOTE — Patient Instructions (Addendum)
LABS TODAY:  CBC  Your physician wants you to follow-up in: 6 months with Dr. Tenny Craw. You will receive a reminder letter in the mail two months in advance. If you don't receive a letter, please call our office to schedule the follow-up appointment.  Check with your insurance/pharmacy about Pradaxa.

## 2013-05-15 DIAGNOSIS — L97509 Non-pressure chronic ulcer of other part of unspecified foot with unspecified severity: Secondary | ICD-10-CM | POA: Diagnosis not present

## 2013-05-15 DIAGNOSIS — E1049 Type 1 diabetes mellitus with other diabetic neurological complication: Secondary | ICD-10-CM | POA: Diagnosis not present

## 2013-05-29 DIAGNOSIS — E1049 Type 1 diabetes mellitus with other diabetic neurological complication: Secondary | ICD-10-CM | POA: Diagnosis not present

## 2013-05-29 DIAGNOSIS — L97509 Non-pressure chronic ulcer of other part of unspecified foot with unspecified severity: Secondary | ICD-10-CM | POA: Diagnosis not present

## 2013-06-19 DIAGNOSIS — L97509 Non-pressure chronic ulcer of other part of unspecified foot with unspecified severity: Secondary | ICD-10-CM | POA: Diagnosis not present

## 2013-06-19 DIAGNOSIS — E1149 Type 2 diabetes mellitus with other diabetic neurological complication: Secondary | ICD-10-CM | POA: Diagnosis not present

## 2013-06-19 DIAGNOSIS — L608 Other nail disorders: Secondary | ICD-10-CM | POA: Diagnosis not present

## 2013-07-24 DIAGNOSIS — L97509 Non-pressure chronic ulcer of other part of unspecified foot with unspecified severity: Secondary | ICD-10-CM | POA: Diagnosis not present

## 2013-07-24 DIAGNOSIS — E1149 Type 2 diabetes mellitus with other diabetic neurological complication: Secondary | ICD-10-CM | POA: Diagnosis not present

## 2013-08-08 ENCOUNTER — Other Ambulatory Visit (INDEPENDENT_AMBULATORY_CARE_PROVIDER_SITE_OTHER): Payer: Medicare Other

## 2013-08-08 ENCOUNTER — Encounter: Payer: Self-pay | Admitting: Internal Medicine

## 2013-08-08 ENCOUNTER — Ambulatory Visit (INDEPENDENT_AMBULATORY_CARE_PROVIDER_SITE_OTHER): Payer: Medicare Other | Admitting: Internal Medicine

## 2013-08-08 VITALS — BP 120/60 | HR 68 | Temp 98.1°F | Resp 16 | Ht 76.0 in | Wt 371.0 lb

## 2013-08-08 DIAGNOSIS — R609 Edema, unspecified: Secondary | ICD-10-CM

## 2013-08-08 DIAGNOSIS — E1149 Type 2 diabetes mellitus with other diabetic neurological complication: Secondary | ICD-10-CM

## 2013-08-08 DIAGNOSIS — E11319 Type 2 diabetes mellitus with unspecified diabetic retinopathy without macular edema: Secondary | ICD-10-CM

## 2013-08-08 DIAGNOSIS — I2510 Atherosclerotic heart disease of native coronary artery without angina pectoris: Secondary | ICD-10-CM

## 2013-08-08 DIAGNOSIS — I10 Essential (primary) hypertension: Secondary | ICD-10-CM

## 2013-08-08 DIAGNOSIS — D62 Acute posthemorrhagic anemia: Secondary | ICD-10-CM

## 2013-08-08 DIAGNOSIS — E119 Type 2 diabetes mellitus without complications: Secondary | ICD-10-CM | POA: Diagnosis not present

## 2013-08-08 DIAGNOSIS — E538 Deficiency of other specified B group vitamins: Secondary | ICD-10-CM

## 2013-08-08 DIAGNOSIS — E1139 Type 2 diabetes mellitus with other diabetic ophthalmic complication: Secondary | ICD-10-CM

## 2013-08-08 LAB — CBC WITH DIFFERENTIAL/PLATELET
Eosinophils Relative: 2.4 % (ref 0.0–5.0)
HCT: 40.4 % (ref 39.0–52.0)
Hemoglobin: 13.5 g/dL (ref 13.0–17.0)
Lymphs Abs: 1 10*3/uL (ref 0.7–4.0)
Monocytes Relative: 6.3 % (ref 3.0–12.0)
Neutro Abs: 5.1 10*3/uL (ref 1.4–7.7)
WBC: 6.7 10*3/uL (ref 4.5–10.5)

## 2013-08-08 LAB — BASIC METABOLIC PANEL
BUN: 19 mg/dL (ref 6–23)
CO2: 30 mEq/L (ref 19–32)
Calcium: 9 mg/dL (ref 8.4–10.5)
GFR: 95.48 mL/min (ref >60.00)
Glucose, Bld: 169 mg/dL — ABNORMAL HIGH (ref 70–99)
Potassium: 5.1 mEq/L (ref 3.5–5.1)
Sodium: 138 mEq/L (ref 135–145)

## 2013-08-08 MED ORDER — INSULIN LISPRO 100 UNIT/ML (KWIKPEN): PEN_INJECTOR | SUBCUTANEOUS | Status: DC

## 2013-08-08 MED ORDER — "INSULIN SYRINGE 31G X 5/16"" 1 ML MISC": 1.0000 | Freq: Three times a day (TID) | Status: DC

## 2013-08-08 NOTE — Assessment & Plan Note (Signed)
Continue with current prescription therapy as reflected on the Med list.  

## 2013-08-08 NOTE — Assessment & Plan Note (Signed)
labs

## 2013-08-08 NOTE — Patient Instructions (Signed)
Wt Readings from Last 3 Encounters:  08/08/13 371 lb (168.284 kg)  05/03/13 371 lb (168.284 kg)  04/06/13 363 lb (164.656 kg)    

## 2013-08-08 NOTE — Progress Notes (Signed)
   Subjective:   HPI  The patient presents for a follow-up of  chronic hypertension, chronic dyslipidemia, type 2 diabetes controlled with medicines. F/u neuropathy  BP Readings from Last 3 Encounters:  08/08/13 120/60  05/03/13 142/72  04/06/13 130/78   Wt Readings from Last 3 Encounters:  08/08/13 371 lb (168.284 kg)  05/03/13 371 lb (168.284 kg)  04/06/13 363 lb (164.656 kg)     Review of Systems  Constitutional: Negative for appetite change, fatigue and unexpected weight change.  HENT: Negative for nosebleeds, congestion, sore throat, sneezing, trouble swallowing and neck pain.   Eyes: Negative for itching and visual disturbance.  Respiratory: Negative for cough.   Cardiovascular: Negative for chest pain, palpitations and leg swelling.  Gastrointestinal: Negative for nausea, diarrhea, blood in stool and abdominal distention.  Genitourinary: Negative for frequency and hematuria.  Musculoskeletal: Negative for back pain, joint swelling and gait problem.  Skin: Negative for rash.  Neurological: Negative for dizziness, tremors, speech difficulty and weakness.  Psychiatric/Behavioral: Negative for sleep disturbance, dysphoric mood and agitation. The patient is not nervous/anxious.        Objective:   Physical Exam  Constitutional: He is oriented to person, place, and time. He appears well-developed.  obese  HENT:  Mouth/Throat: Oropharynx is clear and moist.  Hard hearing  Eyes: Conjunctivae are normal. Pupils are equal, round, and reactive to light.  Neck: Normal range of motion. No JVD present. No thyromegaly present.  Cardiovascular: Normal rate, regular rhythm, normal heart sounds and intact distal pulses.  Exam reveals no gallop and no friction rub.   No murmur heard. Pulmonary/Chest: Effort normal and breath sounds normal. No respiratory distress. He has no wheezes. He has no rales. He exhibits no tenderness.  Abdominal: Soft. Bowel sounds are normal. He exhibits no  distension and no mass. There is no tenderness. There is no rebound and no guarding.  Musculoskeletal: Normal range of motion. He exhibits edema (trace to 1+ B ankles). He exhibits no tenderness.  Lymphadenopathy:    He has no cervical adenopathy.  Neurological: He is alert and oriented to person, place, and time. He has normal reflexes. No cranial nerve deficit. He exhibits normal muscle tone. Coordination normal.  Skin: Skin is warm and dry. No rash noted.  Psychiatric: He has a normal mood and affect. His behavior is normal. Judgment and thought content normal.  R st MCP2-3 tender, atrophic intraosseous muscles in 1-2 spaces R>L  Lab Results  Component Value Date   WBC 8.2 05/03/2013   HGB 12.1* 05/03/2013   HCT 36.2* 05/03/2013   PLT 264.0 05/03/2013   GLUCOSE 139* 03/30/2013   CHOL 144 10/27/2012   TRIG 76 10/27/2012   HDL 43 10/27/2012   LDLCALC 86 10/27/2012   ALT 19 03/30/2013   AST 27 03/30/2013   NA 137 03/30/2013   K 4.3 03/30/2013   CL 104 03/30/2013   CREATININE 0.9 03/30/2013   BUN 13 03/30/2013   CO2 29 03/30/2013   TSH 1.195 10/26/2012   PSA 0.96 08/28/2010   INR 1.13 10/27/2012   HGBA1C 6.5 03/30/2013   MICROALBUR 1.4 08/28/2010         Assessment & Plan:

## 2013-08-08 NOTE — Assessment & Plan Note (Signed)
Wt Readings from Last 3 Encounters:  08/08/13 371 lb (168.284 kg)  05/03/13 371 lb (168.284 kg)  04/06/13 363 lb (164.656 kg)

## 2013-08-08 NOTE — Assessment & Plan Note (Signed)
Doing well 

## 2013-08-15 DIAGNOSIS — M2040 Other hammer toe(s) (acquired), unspecified foot: Secondary | ICD-10-CM | POA: Diagnosis not present

## 2013-08-15 DIAGNOSIS — L97509 Non-pressure chronic ulcer of other part of unspecified foot with unspecified severity: Secondary | ICD-10-CM | POA: Diagnosis not present

## 2013-08-15 DIAGNOSIS — Z79899 Other long term (current) drug therapy: Secondary | ICD-10-CM | POA: Diagnosis not present

## 2013-08-15 DIAGNOSIS — E1149 Type 2 diabetes mellitus with other diabetic neurological complication: Secondary | ICD-10-CM | POA: Diagnosis not present

## 2013-08-22 ENCOUNTER — Other Ambulatory Visit: Payer: Self-pay | Admitting: *Deleted

## 2013-08-22 MED ORDER — LOSARTAN POTASSIUM 100 MG PO TABS: 100.0000 mg | ORAL_TABLET | Freq: Every day | ORAL | Status: DC

## 2013-10-10 ENCOUNTER — Ambulatory Visit: Payer: Self-pay

## 2013-10-16 ENCOUNTER — Ambulatory Visit (INDEPENDENT_AMBULATORY_CARE_PROVIDER_SITE_OTHER): Payer: Medicare Other

## 2013-10-16 VITALS — BP 126/63 | HR 78 | Resp 20 | Ht 76.0 in | Wt 371.0 lb

## 2013-10-16 DIAGNOSIS — E1142 Type 2 diabetes mellitus with diabetic polyneuropathy: Secondary | ICD-10-CM | POA: Diagnosis not present

## 2013-10-16 DIAGNOSIS — L608 Other nail disorders: Secondary | ICD-10-CM

## 2013-10-16 DIAGNOSIS — E1140 Type 2 diabetes mellitus with diabetic neuropathy, unspecified: Secondary | ICD-10-CM

## 2013-10-16 DIAGNOSIS — E1149 Type 2 diabetes mellitus with other diabetic neurological complication: Secondary | ICD-10-CM | POA: Diagnosis not present

## 2013-10-16 NOTE — Patient Instructions (Signed)

## 2013-10-16 NOTE — Progress Notes (Signed)
  Subjective:    Patient ID: Charles [name], male    DOB: [birth year], 72 y.o.   MRN: [id number] "Trim his toenails,"stated patient's wife. HPI no changes medication her health history. Patient does have insulin-dependent diabetes taking the pills and insulin current time. Last A1c was 6.7.    Review of Systems not reviewed at this time.     Objective:   Physical Exam Vascular status as follows dorsalis pedis pulse one over 4 bilateral. PT pulse nonpalpable bilateral. Refill time 4 seconds all digits. 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 No varicosities noted. Neurologically epicritic and proprioceptive sensations diminished on Semmes Weinstein testing to the forefoot digits and arch. Orthopedic biomechanical exam reveals rectus foot type noted previous amputations. Dermatologically skin color pigment normal hair growth absent nails thick brittle friable discolored criptotic consistent with onychomycosis. Skin overall fragile and brittle and shiny secondary to diabetes and vascular compromise.       Assessment & Plan:  Assessment diabetes with peripheral neuropathy/angiopathy history of complications including partial amputation of toes bilateral feet. Plan at this time debridement of nails x8 and the presence of diabetes and complicated factors. Maintain accommodative diabetic shoes as instructed. Return in 2-3 months for continued palliative care next  Alvan Dame DPM

## 2013-10-18 ENCOUNTER — Other Ambulatory Visit: Payer: Self-pay

## 2013-11-13 ENCOUNTER — Ambulatory Visit (INDEPENDENT_AMBULATORY_CARE_PROVIDER_SITE_OTHER): Payer: Medicare Other | Admitting: Internal Medicine

## 2013-11-13 ENCOUNTER — Other Ambulatory Visit (INDEPENDENT_AMBULATORY_CARE_PROVIDER_SITE_OTHER): Payer: Medicare Other

## 2013-11-13 ENCOUNTER — Encounter: Payer: Self-pay | Admitting: Internal Medicine

## 2013-11-13 VITALS — BP 130/80 | HR 72 | Temp 98.2°F | Resp 16 | Wt 356.0 lb

## 2013-11-13 DIAGNOSIS — E1149 Type 2 diabetes mellitus with other diabetic neurological complication: Secondary | ICD-10-CM | POA: Diagnosis not present

## 2013-11-13 DIAGNOSIS — E119 Type 2 diabetes mellitus without complications: Secondary | ICD-10-CM

## 2013-11-13 DIAGNOSIS — E1142 Type 2 diabetes mellitus with diabetic polyneuropathy: Secondary | ICD-10-CM

## 2013-11-13 DIAGNOSIS — R609 Edema, unspecified: Secondary | ICD-10-CM

## 2013-11-13 DIAGNOSIS — I10 Essential (primary) hypertension: Secondary | ICD-10-CM | POA: Diagnosis not present

## 2013-11-13 DIAGNOSIS — I6789 Other cerebrovascular disease: Secondary | ICD-10-CM

## 2013-11-13 DIAGNOSIS — I2510 Atherosclerotic heart disease of native coronary artery without angina pectoris: Secondary | ICD-10-CM

## 2013-11-13 DIAGNOSIS — Z23 Encounter for immunization: Secondary | ICD-10-CM | POA: Diagnosis not present

## 2013-11-13 DIAGNOSIS — I480 Paroxysmal atrial fibrillation: Secondary | ICD-10-CM | POA: Insufficient documentation

## 2013-11-13 DIAGNOSIS — I4891 Unspecified atrial fibrillation: Secondary | ICD-10-CM

## 2013-11-13 DIAGNOSIS — E538 Deficiency of other specified B group vitamins: Secondary | ICD-10-CM

## 2013-11-13 DIAGNOSIS — D62 Acute posthemorrhagic anemia: Secondary | ICD-10-CM

## 2013-11-13 LAB — IBC PANEL
Iron: 104 ug/dL (ref 42–165)
Saturation Ratios: 29.3 % (ref 20.0–50.0)

## 2013-11-13 LAB — BASIC METABOLIC PANEL
BUN: 18 mg/dL (ref 6–23)
CO2: 31 mEq/L (ref 19–32)
Calcium: 9.2 mg/dL (ref 8.4–10.5)
Chloride: 102 mEq/L (ref 96–112)
Creatinine, Ser: 0.9 mg/dL (ref 0.4–1.5)
Glucose, Bld: 150 mg/dL — ABNORMAL HIGH (ref 70–99)

## 2013-11-13 MED ORDER — GLIMEPIRIDE 4 MG PO TABS: 4.0000 mg | ORAL_TABLET | Freq: Two times a day (BID) | ORAL | Status: DC

## 2013-11-13 MED ORDER — PRAVASTATIN SODIUM 40 MG PO TABS: 40.0000 mg | ORAL_TABLET | Freq: Every day | ORAL | Status: DC

## 2013-11-13 MED ORDER — LOSARTAN POTASSIUM 100 MG PO TABS: 100.0000 mg | ORAL_TABLET | Freq: Every day | ORAL | Status: DC

## 2013-11-13 MED ORDER — PIOGLITAZONE HCL 45 MG PO TABS: 45.0000 mg | ORAL_TABLET | Freq: Every day | ORAL | Status: DC

## 2013-11-13 MED ORDER — METOPROLOL TARTRATE 50 MG PO TABS: 50.0000 mg | ORAL_TABLET | Freq: Two times a day (BID) | ORAL | Status: DC

## 2013-11-13 MED ORDER — GABAPENTIN 300 MG PO CAPS: 300.0000 mg | ORAL_CAPSULE | Freq: Three times a day (TID) | ORAL | Status: DC | PRN

## 2013-11-13 NOTE — Assessment & Plan Note (Signed)
Continue with current prescription therapy as reflected on the Med list.  

## 2013-11-13 NOTE — Assessment & Plan Note (Signed)
Wt Readings from Last 3 Encounters:  11/13/13 356 lb (161.481 kg)  10/16/13 371 lb (168.284 kg)  08/08/13 371 lb (168.284 kg)  Better

## 2013-11-13 NOTE — Progress Notes (Signed)
Pre visit review using our clinic review tool, if applicable. No additional management support is needed unless otherwise documented below in the visit note. 

## 2013-11-13 NOTE — Progress Notes (Signed)
   Subjective:   HPI  The patient presents for a follow-up of  chronic hypertension, chronic dyslipidemia, type 2 diabetes controlled with medicines. F/u neuropathy  Charles [name] wants to stop Xarelto due to cost and lawyers adds on TV and to go back on Plavix...  BP Readings from Last 3 Encounters:  11/13/13 130/80  10/16/13 126/63  08/08/13 120/60   Wt Readings from Last 3 Encounters:  11/13/13 356 lb (161.481 kg)  10/16/13 371 lb (168.284 kg)  08/08/13 371 lb (168.284 kg)     Review of Systems  Constitutional: Negative for appetite change, fatigue and unexpected weight change.  HENT: Negative for congestion, nosebleeds, sneezing, sore throat and trouble swallowing.   Eyes: Negative for itching and visual disturbance.  Respiratory: Negative for cough.   Cardiovascular: Negative for chest pain, palpitations and leg swelling.  Gastrointestinal: Negative for nausea, diarrhea, blood in stool and abdominal distention.  Genitourinary: Negative for frequency and hematuria.  Musculoskeletal: Negative for back pain, gait problem, joint swelling and neck pain.  Skin: Negative for rash.  Neurological: Negative for dizziness, tremors, speech difficulty and weakness.  Psychiatric/Behavioral: Negative for sleep disturbance, dysphoric mood and agitation. The patient is not nervous/anxious.        Objective:   Physical Exam  Constitutional: He is oriented to person, place, and time. He appears well-developed.  obese  HENT:  Mouth/Throat: Oropharynx is clear and moist.  Hard hearing  Eyes: Conjunctivae are normal. Pupils are equal, round, and reactive to light.  Neck: Normal range of motion. No JVD present. No thyromegaly present.  Cardiovascular: Normal rate, regular rhythm, normal heart sounds and intact distal pulses.  Exam reveals no gallop and no friction rub.   No murmur heard. Pulmonary/Chest: Effort normal and breath sounds normal. No respiratory distress. He has no wheezes. He  has no rales. He exhibits no tenderness.  Abdominal: Soft. Bowel sounds are normal. He exhibits no distension and no mass. There is no tenderness. There is no rebound and no guarding.  Musculoskeletal: Normal range of motion. He exhibits edema (trace to 1+ B ankles). He exhibits no tenderness.  Lymphadenopathy:    He has no cervical adenopathy.  Neurological: He is alert and oriented to person, place, and time. He has normal reflexes. No cranial nerve deficit. He exhibits normal muscle tone. Coordination normal.  Skin: Skin is warm and dry. No rash noted.  Psychiatric: He has a normal mood and affect. His behavior is normal. Judgment and thought content normal.    Lab Results  Component Value Date   WBC 6.7 08/08/2013   HGB 13.5 08/08/2013   HCT 40.4 08/08/2013   PLT 254.0 08/08/2013   GLUCOSE 169* 08/08/2013   CHOL 144 10/27/2012   TRIG 76 10/27/2012   HDL 43 10/27/2012   LDLCALC 86 10/27/2012   ALT 19 03/30/2013   AST 27 03/30/2013   NA 138 08/08/2013   K 5.1 08/08/2013   CL 101 08/08/2013   CREATININE 0.8 08/08/2013   BUN 19 08/08/2013   CO2 30 08/08/2013   TSH 1.195 10/26/2012   PSA 0.96 08/28/2010   INR 1.13 10/27/2012   HGBA1C 6.7* 08/08/2013   MICROALBUR 1.4 08/28/2010         Assessment & Plan:

## 2013-11-13 NOTE — Patient Instructions (Signed)
Coumadin clinic for coumadin Plavix - it is an option, however it is inferior compare to other options

## 2013-11-13 NOTE — Assessment & Plan Note (Addendum)
C/o Xarelto $$$$ ($400 per 3 mo) Mr Tangeman wants to stop Xarelto due to cost and "lawyers adds on TV" and to go back on Plavix.[facility] option for coumadin therapy instead of Xarelto. He would like to use Plavix - it is not a good option in his situation. He will discuss this issue again w/Dr Tenny Craw

## 2014-01-15 ENCOUNTER — Ambulatory Visit: Payer: Medicare Other

## 2014-02-05 ENCOUNTER — Ambulatory Visit: Payer: Medicare Other

## 2014-02-13 ENCOUNTER — Other Ambulatory Visit (INDEPENDENT_AMBULATORY_CARE_PROVIDER_SITE_OTHER): Payer: Medicare Other

## 2014-02-13 ENCOUNTER — Encounter: Payer: Self-pay | Admitting: Internal Medicine

## 2014-02-13 ENCOUNTER — Ambulatory Visit (INDEPENDENT_AMBULATORY_CARE_PROVIDER_SITE_OTHER): Payer: Medicare Other | Admitting: Internal Medicine

## 2014-02-13 VITALS — BP 132/70 | HR 80 | Temp 97.0°F | Resp 16 | Wt 373.0 lb

## 2014-02-13 DIAGNOSIS — E1149 Type 2 diabetes mellitus with other diabetic neurological complication: Secondary | ICD-10-CM

## 2014-02-13 DIAGNOSIS — E1139 Type 2 diabetes mellitus with other diabetic ophthalmic complication: Secondary | ICD-10-CM | POA: Diagnosis not present

## 2014-02-13 DIAGNOSIS — E11319 Type 2 diabetes mellitus with unspecified diabetic retinopathy without macular edema: Secondary | ICD-10-CM | POA: Diagnosis not present

## 2014-02-13 DIAGNOSIS — I4891 Unspecified atrial fibrillation: Secondary | ICD-10-CM

## 2014-02-13 DIAGNOSIS — R21 Rash and other nonspecific skin eruption: Secondary | ICD-10-CM

## 2014-02-13 LAB — BASIC METABOLIC PANEL
BUN: 16 mg/dL (ref 6–23)
CALCIUM: 9 mg/dL (ref 8.4–10.5)
CO2: 31 meq/L (ref 19–32)
Chloride: 101 mEq/L (ref 96–112)
Creatinine, Ser: 0.8 mg/dL (ref 0.4–1.5)
GFR: 100.86 mL/min (ref >60.00)
GLUCOSE: 163 mg/dL — AB (ref 70–99)
Potassium: 4.9 mEq/L (ref 3.5–5.1)
SODIUM: 137 meq/L (ref 135–145)

## 2014-02-13 LAB — HEMOGLOBIN A1C: Hgb A1c MFr Bld: 6.9 % — ABNORMAL HIGH (ref 4.6–6.5)

## 2014-02-13 MED ORDER — TRIAMCINOLONE ACETONIDE 0.5 % EX CREA
1.0000 | TOPICAL_CREAM | Freq: Three times a day (TID) | CUTANEOUS | Status: DC
Start: 2014-02-13 — End: 2014-05-25

## 2014-02-13 MED ORDER — CLOPIDOGREL BISULFATE 75 MG PO TABS: 75.0000 mg | ORAL_TABLET | Freq: Every day | ORAL | Status: DC

## 2014-02-13 MED ORDER — METHYLPREDNISOLONE ACETATE 80 MG/ML IJ SUSP
80.0000 mg | Freq: Once | INTRAMUSCULAR | Status: AC
  Administered 2014-02-13: 80 mg via INTRAMUSCULAR

## 2014-02-13 NOTE — Progress Notes (Deleted)
Pre visit review using our clinic review tool, if applicable. No additional management support is needed unless otherwise documented below in the visit note. 

## 2014-02-13 NOTE — Assessment & Plan Note (Signed)
Continue with current prescription therapy as reflected on the Med list.  

## 2014-02-13 NOTE — Assessment & Plan Note (Addendum)
Declined Coumadin CVA risks discussed. F/u w/Dr Harrington Challenger Rash on Xarelto Agreed to take Plavix

## 2014-02-13 NOTE — Patient Instructions (Signed)
Stop Xarelto!!! Zyrtec 1 a day

## 2014-02-13 NOTE — Assessment & Plan Note (Signed)
Continue with current prescription therapy as reflected on the Med list. Labs  

## 2014-02-13 NOTE — Progress Notes (Signed)
   Subjective:   HPI  C/o rash on arms and torso - new It started when he started Xarelto in January.... The patient presents for a follow-up of  chronic hypertension, chronic dyslipidemia, type 2 diabetes controlled with medicines. F/u neuropathy  Charles [name] wants to stop Xarelto due to cost and lawyers adds on TV and to go back on Plavix...  BP Readings from Last 3 Encounters:  02/13/14 132/70  11/13/13 130/80  10/16/13 126/63   Wt Readings from Last 3 Encounters:  02/13/14 373 lb (169.192 kg)  11/13/13 356 lb (161.481 kg)  10/16/13 371 lb (168.284 kg)     Review of Systems  Constitutional: Negative for appetite change, fatigue and unexpected weight change.  HENT: Negative for congestion, nosebleeds, sneezing, sore throat and trouble swallowing.   Eyes: Negative for itching and visual disturbance.  Respiratory: Negative for cough.   Cardiovascular: Negative for chest pain, palpitations and leg swelling.  Gastrointestinal: Negative for nausea, diarrhea, blood in stool and abdominal distention.  Genitourinary: Negative for frequency and hematuria.  Musculoskeletal: Negative for back pain, gait problem, joint swelling and neck pain.  Skin: Positive for rash.  Neurological: Negative for dizziness, tremors, speech difficulty and weakness.  Psychiatric/Behavioral: Negative for sleep disturbance, dysphoric mood and agitation. The patient is not nervous/anxious.        Objective:   Physical Exam  Constitutional: He is oriented to person, place, and time. He appears well-developed.  obese  HENT:  Mouth/Throat: Oropharynx is clear and moist.  Hard hearing  Eyes: Conjunctivae are normal. Pupils are equal, round, and reactive to light.  Neck: Normal range of motion. No JVD present. No thyromegaly present.  Cardiovascular: Normal rate, regular rhythm, normal heart sounds and intact distal pulses.  Exam reveals no gallop and no friction rub.   No murmur heard. Pulmonary/Chest:  Effort normal and breath sounds normal. No respiratory distress. He has no wheezes. He has no rales. He exhibits no tenderness.  Abdominal: Soft. Bowel sounds are normal. He exhibits no distension and no mass. There is no tenderness. There is no rebound and no guarding.  Musculoskeletal: Normal range of motion. He exhibits edema (trace to 1+ B ankles). He exhibits no tenderness.  Lymphadenopathy:    He has no cervical adenopathy.  Neurological: He is alert and oriented to person, place, and time. He has normal reflexes. No cranial nerve deficit. He exhibits normal muscle tone. Coordination normal.  Skin: Skin is warm and dry. Rash noted. There is erythema.  B forearms, trunk  Psychiatric: He has a normal mood and affect. His behavior is normal. Judgment and thought content normal.    Lab Results  Component Value Date   WBC 6.7 08/08/2013   HGB 13.5 08/08/2013   HCT 40.4 08/08/2013   PLT 254.0 08/08/2013   GLUCOSE 150* 11/13/2013   CHOL 144 10/27/2012   TRIG 76 10/27/2012   HDL 43 10/27/2012   LDLCALC 86 10/27/2012   ALT 19 03/30/2013   AST 27 03/30/2013   NA 138 11/13/2013   K 4.8 11/13/2013   CL 102 11/13/2013   CREATININE 0.9 11/13/2013   BUN 18 11/13/2013   CO2 31 11/13/2013   TSH 1.195 10/26/2012   PSA 0.96 08/28/2010   INR 1.13 10/27/2012   HGBA1C 7.5* 11/13/2013   MICROALBUR 1.4 08/28/2010         Assessment & Plan:

## 2014-02-13 NOTE — Assessment & Plan Note (Addendum)
3/15 due to Xarelto - d/c'd Depomedrol 80 mg im Zyrtec Triamc cream

## 2014-02-14 ENCOUNTER — Encounter: Payer: Self-pay | Admitting: Internal Medicine

## 2014-02-22 ENCOUNTER — Ambulatory Visit (INDEPENDENT_AMBULATORY_CARE_PROVIDER_SITE_OTHER): Payer: Medicare Other

## 2014-02-22 VITALS — BP 139/74 | HR 62 | Resp 18

## 2014-02-22 DIAGNOSIS — L608 Other nail disorders: Secondary | ICD-10-CM | POA: Diagnosis not present

## 2014-02-22 DIAGNOSIS — E1140 Type 2 diabetes mellitus with diabetic neuropathy, unspecified: Secondary | ICD-10-CM

## 2014-02-22 DIAGNOSIS — E1149 Type 2 diabetes mellitus with other diabetic neurological complication: Secondary | ICD-10-CM

## 2014-02-22 NOTE — Patient Instructions (Signed)
Diabetes and Foot Care Diabetes may cause you to have problems because of poor blood supply (circulation) to your feet and legs. This may cause the skin on your feet to become thinner, break easier, and heal more slowly. Your skin may become dry, and the skin may peel and crack. You may also have nerve damage in your legs and feet causing decreased feeling in them. You may not notice minor injuries to your feet that could lead to infections or more serious problems. Taking care of your feet is one of the most important things you can do for yourself.  HOME CARE INSTRUCTIONS  Wear shoes at all times, even in the house. Do not go barefoot. Bare feet are easily injured.  Check your feet daily for blisters, cuts, and redness. If you cannot see the bottom of your feet, use a mirror or ask someone for help.  Wash your feet with warm water (do not use hot water) and mild soap. Then pat your feet and the areas between your toes until they are completely dry. Do not soak your feet as this can dry your skin.  Apply a moisturizing lotion or petroleum jelly (that does not contain alcohol and is unscented) to the skin on your feet and to dry, brittle toenails. Do not apply lotion between your toes.  Trim your toenails straight across. Do not dig under them or around the cuticle. File the edges of your nails with an emery board or nail file.  Do not cut corns or calluses or try to remove them with medicine.  Wear clean socks or stockings every day. Make sure they are not too tight. Do not wear knee-high stockings since they may decrease blood flow to your legs.  Wear shoes that fit properly and have enough cushioning. To break in new shoes, wear them for just a few hours a day. This prevents you from injuring your feet. Always look in your shoes before you put them on to be sure there are no objects inside.  Do not cross your legs. This may decrease the blood flow to your feet.  If you find a minor scrape,  cut, or break in the skin on your feet, keep it and the skin around it clean and dry. These areas may be cleansed with mild soap and water. Do not cleanse the area with peroxide, alcohol, or iodine.  When you remove an adhesive bandage, be sure not to damage the skin around it.  If you have a wound, look at it several times a day to make sure it is healing.  Do not use heating pads or hot water bottles. They may burn your skin. If you have lost feeling in your feet or legs, you may not know it is happening until it is too late.  Make sure your health care provider performs a complete foot exam at least annually or more often if you have foot problems. Report any cuts, sores, or bruises to your health care provider immediately. SEEK MEDICAL CARE IF:   You have an injury that is not healing.  You have cuts or breaks in the skin.  You have an ingrown nail.  You notice redness on your legs or feet.  You feel burning or tingling in your legs or feet.  You have pain or cramps in your legs and feet.  Your legs or feet are numb.  Your feet always feel cold. SEEK IMMEDIATE MEDICAL CARE IF:   There is increasing redness,   swelling, or pain in or around a wound.  There is a red line that goes up your leg.  Pus is coming from a wound.  You develop a fever or as directed by your health care provider.  You notice a bad smell coming from an ulcer or wound. Document Released: 11/26/2000 Document Revised: 08/01/2013 Document Reviewed: 05/08/2013 [employer] Patient Information 2014 [employer]  

## 2014-02-22 NOTE — Progress Notes (Signed)
   Subjective:    Patient ID: Charles [name], male    DOB: [birth year], 73 y.o.   MRN: [id number]  HPI Comments: "Trim his toenails"     Review of Systems no new changes or findings     Objective:   Physical Exam Vascular status is intact although diminished bilateral pedal pulses DP plus one over 4 PT nonpalpable bilateral refill time 4-5 seconds all digits patient is indicated first left fifth right status post diabetes with neuropathy complications. Thick brittle crumbly dystrophic criptotic nails there no distal ulcerations this time no open wounds no other difficulties patient does have rigid digital contractures atrophy the skin and the skin texture and turgor is absent hair growth are noted. Nails thick friable brittle criptotic consistent with onychomycosis and proptosis of nails       Assessment & Plan:  Assessment this time his diabetes with complications history peripheral neuropathy and partial amputations. Thick brittle crumbly before mycotic nails debrided x8 return for future palliative care in 2-3 months for an as-needed basis  Harriet Masson DPM

## 2014-05-15 ENCOUNTER — Ambulatory Visit (INDEPENDENT_AMBULATORY_CARE_PROVIDER_SITE_OTHER): Payer: Medicare Other | Admitting: Internal Medicine

## 2014-05-15 ENCOUNTER — Other Ambulatory Visit (INDEPENDENT_AMBULATORY_CARE_PROVIDER_SITE_OTHER): Payer: Medicare Other

## 2014-05-15 ENCOUNTER — Encounter: Payer: Self-pay | Admitting: Internal Medicine

## 2014-05-15 VITALS — BP 118/68 | HR 68 | Temp 99.0°F | Wt 349.4 lb

## 2014-05-15 DIAGNOSIS — E119 Type 2 diabetes mellitus without complications: Secondary | ICD-10-CM

## 2014-05-15 DIAGNOSIS — E1149 Type 2 diabetes mellitus with other diabetic neurological complication: Secondary | ICD-10-CM | POA: Diagnosis not present

## 2014-05-15 DIAGNOSIS — I4891 Unspecified atrial fibrillation: Secondary | ICD-10-CM | POA: Diagnosis not present

## 2014-05-15 DIAGNOSIS — R635 Abnormal weight gain: Secondary | ICD-10-CM | POA: Diagnosis not present

## 2014-05-15 LAB — BASIC METABOLIC PANEL
BUN: 12 mg/dL (ref 6–23)
CHLORIDE: 98 meq/L (ref 96–112)
CO2: 31 meq/L (ref 19–32)
CREATININE: 0.8 mg/dL (ref 0.4–1.5)
Calcium: 9.2 mg/dL (ref 8.4–10.5)
GFR: 103.78 mL/min (ref >60.00)
Glucose, Bld: 216 mg/dL — ABNORMAL HIGH (ref 70–99)
Potassium: 4.8 mEq/L (ref 3.5–5.1)
Sodium: 136 mEq/L (ref 135–145)

## 2014-05-15 LAB — HEMOGLOBIN A1C: Hgb A1c MFr Bld: 7.9 % — ABNORMAL HIGH (ref 4.6–6.5)

## 2014-05-15 MED ORDER — GLIMEPIRIDE 4 MG PO TABS: 4.0000 mg | ORAL_TABLET | Freq: Two times a day (BID) | ORAL | Status: DC

## 2014-05-15 MED ORDER — PIOGLITAZONE HCL 45 MG PO TABS: 45.0000 mg | ORAL_TABLET | Freq: Every day | ORAL | Status: DC

## 2014-05-15 MED ORDER — PRAVASTATIN SODIUM 40 MG PO TABS: 40.0000 mg | ORAL_TABLET | Freq: Every day | ORAL | Status: DC

## 2014-05-15 MED ORDER — GABAPENTIN 300 MG PO CAPS: 300.0000 mg | ORAL_CAPSULE | Freq: Three times a day (TID) | ORAL | Status: DC | PRN

## 2014-05-15 MED ORDER — LOSARTAN POTASSIUM 100 MG PO TABS: 100.0000 mg | ORAL_TABLET | Freq: Every day | ORAL | Status: DC

## 2014-05-15 MED ORDER — METOPROLOL TARTRATE 50 MG PO TABS: 50.0000 mg | ORAL_TABLET | Freq: Two times a day (BID) | ORAL | Status: DC

## 2014-05-15 MED ORDER — INSULIN LISPRO 100 UNIT/ML (KWIKPEN): PEN_INJECTOR | SUBCUTANEOUS | Status: DC

## 2014-05-15 NOTE — Progress Notes (Signed)
   Subjective:   HPI  F/u rash on arms and torso - new It started when he started Xarelto in January.... The patient presents for a follow-up of  chronic hypertension, chronic dyslipidemia, type 2 diabetes controlled with medicines. F/u neuropathy  Mr Sevey wants to stop Xarelto due to cost and lawyers adds on TV and to go back on Plavix...  BP Readings from Last 3 Encounters:  05/15/14 118/68  02/22/14 139/74  02/13/14 132/70   Wt Readings from Last 3 Encounters:  05/15/14 349 lb 6 oz (158.475 kg)  02/13/14 373 lb (169.192 kg)  11/13/13 356 lb (161.481 kg)     Review of Systems  Constitutional: Negative for appetite change, fatigue and unexpected weight change.  HENT: Negative for congestion, nosebleeds, sneezing, sore throat and trouble swallowing.   Eyes: Negative for itching and visual disturbance.  Respiratory: Negative for cough.   Cardiovascular: Negative for chest pain, palpitations and leg swelling.  Gastrointestinal: Negative for nausea, diarrhea, blood in stool and abdominal distention.  Genitourinary: Negative for frequency and hematuria.  Musculoskeletal: Negative for back pain, gait problem, joint swelling and neck pain.  Skin: Positive for rash.  Neurological: Negative for dizziness, tremors, speech difficulty and weakness.  Psychiatric/Behavioral: Negative for sleep disturbance, dysphoric mood and agitation. The patient is not nervous/anxious.        Objective:   Physical Exam  Constitutional: He is oriented to person, place, and time. He appears well-developed.  obese  HENT:  Mouth/Throat: Oropharynx is clear and moist.  Hard hearing  Eyes: Conjunctivae are normal. Pupils are equal, round, and reactive to light.  Neck: Normal range of motion. No JVD present. No thyromegaly present.  Cardiovascular: Normal rate, regular rhythm, normal heart sounds and intact distal pulses.  Exam reveals no gallop and no friction rub.   No murmur  heard. Pulmonary/Chest: Effort normal and breath sounds normal. No respiratory distress. He has no wheezes. He has no rales. He exhibits no tenderness.  Abdominal: Soft. Bowel sounds are normal. He exhibits no distension and no mass. There is no tenderness. There is no rebound and no guarding.  Musculoskeletal: Normal range of motion. He exhibits edema (trace to 1+ B ankles). He exhibits no tenderness.  Lymphadenopathy:    He has no cervical adenopathy.  Neurological: He is alert and oriented to person, place, and time. He has normal reflexes. No cranial nerve deficit. He exhibits normal muscle tone. Coordination normal.  Skin: Skin is warm and dry. Rash noted. There is erythema.  B forearms, trunk  Psychiatric: He has a normal mood and affect. His behavior is normal. Judgment and thought content normal.    Lab Results  Component Value Date   WBC 6.7 08/08/2013   HGB 13.5 08/08/2013   HCT 40.4 08/08/2013   PLT 254.0 08/08/2013   GLUCOSE 163* 02/13/2014   CHOL 144 10/27/2012   TRIG 76 10/27/2012   HDL 43 10/27/2012   LDLCALC 86 10/27/2012   ALT 19 03/30/2013   AST 27 03/30/2013   NA 137 02/13/2014   K 4.9 02/13/2014   CL 101 02/13/2014   CREATININE 0.8 02/13/2014   BUN 16 02/13/2014   CO2 31 02/13/2014   TSH 1.195 10/26/2012   PSA 0.96 08/28/2010   INR 1.13 10/27/2012   HGBA1C 6.9* 02/13/2014   MICROALBUR 1.4 08/28/2010         Assessment & Plan:

## 2014-05-15 NOTE — Assessment & Plan Note (Signed)
Dr Harrington Challenger PAF 3/15 Declined Coumadin Rash on Xarelto Agreed to take Plavix

## 2014-05-15 NOTE — Assessment & Plan Note (Signed)
Continue with current prescription therapy as reflected on the Med list. He lost 24 lbs

## 2014-05-15 NOTE — Progress Notes (Signed)
Pre visit review using our clinic review tool, if applicable. No additional management support is needed unless otherwise documented below in the visit note. 

## 2014-05-15 NOTE — Assessment & Plan Note (Signed)
Continue with current prescription therapy as reflected on the Med list.  

## 2014-05-15 NOTE — Assessment & Plan Note (Signed)
He lost 24 lbs on diet

## 2014-05-19 ENCOUNTER — Encounter: Payer: Self-pay | Admitting: Internal Medicine

## 2014-05-20 ENCOUNTER — Encounter: Payer: Self-pay | Admitting: Internal Medicine

## 2014-05-21 ENCOUNTER — Ambulatory Visit (INDEPENDENT_AMBULATORY_CARE_PROVIDER_SITE_OTHER): Payer: Medicare Other

## 2014-05-21 ENCOUNTER — Other Ambulatory Visit: Payer: Self-pay | Admitting: *Deleted

## 2014-05-21 VITALS — BP 157/76 | HR 64 | Resp 12

## 2014-05-21 DIAGNOSIS — E1149 Type 2 diabetes mellitus with other diabetic neurological complication: Secondary | ICD-10-CM

## 2014-05-21 DIAGNOSIS — L608 Other nail disorders: Secondary | ICD-10-CM

## 2014-05-21 DIAGNOSIS — E1142 Type 2 diabetes mellitus with diabetic polyneuropathy: Secondary | ICD-10-CM | POA: Diagnosis not present

## 2014-05-21 DIAGNOSIS — E1140 Type 2 diabetes mellitus with diabetic neuropathy, unspecified: Secondary | ICD-10-CM

## 2014-05-21 MED ORDER — "INSULIN SYRINGE 29G X 1/2"" 1 ML MISC": 1.0000 | Freq: Three times a day (TID) | Status: DC

## 2014-05-21 NOTE — Progress Notes (Signed)
   Subjective:    Patient ID: Charles [name], male    DOB: Nov 13, 1941, 73 y.o.   MRN: [id number]  HPI patient presents this time for diabetic foot and nail care debridement of nails both feet    Review of Systems no systemic changes or findings noted at this time to     Objective:   Physical Exam Lower extremity objective findings as follows vascular status is diminished with pedal pulses palpable DP plus one over 4 PT nonpalpable bilateral Refill time 4-5 seconds all digits patient is a previous digital amputations first left fifth right and does have history of diabetes with neuropathy and complications thick brittle crumbly dystrophic probably orthotic nails the remaining digits are carried out at or debridement this time no open wounds or active ulcerations are noted on debridement lumicain Neosporin and Band-Aid dressing applied to right hallux following today's debridement       Assessment & Plan:  Assessment diabetes with complications history of previous amputations with neuropathy and angiopathy noted secondary diabetes. Patient is a candidate for new diabetic shoes obtain authorization from Dr. apply cough regarding his diabetes treatment status protocols patient will continue daily palliative nail care nails thick brittle crumbly dystrophic friable mycotic debrided x8 return for future nail care as needed follow contacted the patient be contacted [place] if we've received clearance for diabetic shoes through his primary physician Dr.Plotnikov was managing his diabetes at current time recheck in 3 months for continued palliative care in the future  Harriet Masson DPM

## 2014-05-21 NOTE — Patient Instructions (Signed)
Diabetes and Foot Care Diabetes may cause you to have problems because of poor blood supply (circulation) to your feet and legs. This may cause the skin on your feet to become thinner, break easier, and heal more slowly. Your skin may become dry, and the skin may peel and crack. You may also have nerve damage in your legs and feet causing decreased feeling in them. You may not notice minor injuries to your feet that could lead to infections or more serious problems. Taking care of your feet is one of the most important things you can do for yourself.  HOME CARE INSTRUCTIONS  Wear shoes at all times, even in the house. Do not go barefoot. Bare feet are easily injured.  Check your feet daily for blisters, cuts, and redness. If you cannot see the bottom of your feet, use a mirror or ask someone for help.  Wash your feet with warm water (do not use hot water) and mild soap. Then pat your feet and the areas between your toes until they are completely dry. Do not soak your feet as this can dry your skin.  Apply a moisturizing lotion or petroleum jelly (that does not contain alcohol and is unscented) to the skin on your feet and to dry, brittle toenails. Do not apply lotion between your toes.  Trim your toenails straight across. Do not dig under them or around the cuticle. File the edges of your nails with an emery board or nail file.  Do not cut corns or calluses or try to remove them with medicine.  Wear clean socks or stockings every day. Make sure they are not too tight. Do not wear knee-high stockings since they may decrease blood flow to your legs.  Wear shoes that fit properly and have enough cushioning. To break in new shoes, wear them for just a few hours a day. This prevents you from injuring your feet. Always look in your shoes before you put them on to be sure there are no objects inside.  Do not cross your legs. This may decrease the blood flow to your feet.  If you find a minor scrape,  cut, or break in the skin on your feet, keep it and the skin around it clean and dry. These areas may be cleansed with mild soap and water. Do not cleanse the area with peroxide, alcohol, or iodine.  When you remove an adhesive bandage, be sure not to damage the skin around it.  If you have a wound, look at it several times a day to make sure it is healing.  Do not use heating pads or hot water bottles. They may burn your skin. If you have lost feeling in your feet or legs, you may not know it is happening until it is too late.  Make sure your health care provider performs a complete foot exam at least annually or more often if you have foot problems. Report any cuts, sores, or bruises to your health care provider immediately. SEEK MEDICAL CARE IF:   You have an injury that is not healing.  You have cuts or breaks in the skin.  You have an ingrown nail.  You notice redness on your legs or feet.  You feel burning or tingling in your legs or feet.  You have pain or cramps in your legs and feet.  Your legs or feet are numb.  Your feet always feel cold. SEEK IMMEDIATE MEDICAL CARE IF:   There is increasing redness,   swelling, or pain in or around a wound.  There is a red line that goes up your leg.  Pus is coming from a wound.  You develop a fever or as directed by your health care provider.  You notice a bad smell coming from an ulcer or wound. Document Released: 11/26/2000 Document Revised: 08/01/2013 Document Reviewed: 05/08/2013 [employer] Patient Information 2014 [employer]  

## 2014-05-25 ENCOUNTER — Emergency Department (HOSPITAL_COMMUNITY)
Admission: EM | Admit: 2014-05-25 | Discharge: 2014-05-25 | Disposition: A | Discharge status: Home / Self Care | Payer: Medicare Other | Attending: Emergency Medicine | Admitting: Emergency Medicine

## 2014-05-25 ENCOUNTER — Emergency Department (HOSPITAL_COMMUNITY): Payer: Medicare Other

## 2014-05-25 ENCOUNTER — Encounter (HOSPITAL_COMMUNITY): Payer: Self-pay | Admitting: Emergency Medicine

## 2014-05-25 DIAGNOSIS — IMO0002 Reserved for concepts with insufficient information to code with codable children: Secondary | ICD-10-CM | POA: Diagnosis not present

## 2014-05-25 DIAGNOSIS — G4733 Obstructive sleep apnea (adult) (pediatric): Secondary | ICD-10-CM | POA: Insufficient documentation

## 2014-05-25 DIAGNOSIS — Z7902 Long term (current) use of antithrombotics/antiplatelets: Secondary | ICD-10-CM | POA: Diagnosis not present

## 2014-05-25 DIAGNOSIS — Z87891 Personal history of nicotine dependence: Secondary | ICD-10-CM | POA: Diagnosis not present

## 2014-05-25 DIAGNOSIS — Z9889 Other specified postprocedural states: Secondary | ICD-10-CM | POA: Diagnosis not present

## 2014-05-25 DIAGNOSIS — E669 Obesity, unspecified: Secondary | ICD-10-CM | POA: Insufficient documentation

## 2014-05-25 DIAGNOSIS — Z8673 Personal history of transient ischemic attack (TIA), and cerebral infarction without residual deficits: Secondary | ICD-10-CM | POA: Insufficient documentation

## 2014-05-25 DIAGNOSIS — Z8719 Personal history of other diseases of the digestive system: Secondary | ICD-10-CM | POA: Diagnosis not present

## 2014-05-25 DIAGNOSIS — Z794 Long term (current) use of insulin: Secondary | ICD-10-CM | POA: Diagnosis not present

## 2014-05-25 DIAGNOSIS — E1149 Type 2 diabetes mellitus with other diabetic neurological complication: Secondary | ICD-10-CM | POA: Diagnosis not present

## 2014-05-25 DIAGNOSIS — G44209 Tension-type headache, unspecified, not intractable: Secondary | ICD-10-CM | POA: Diagnosis not present

## 2014-05-25 DIAGNOSIS — I2510 Atherosclerotic heart disease of native coronary artery without angina pectoris: Secondary | ICD-10-CM | POA: Diagnosis not present

## 2014-05-25 DIAGNOSIS — Z8614 Personal history of Methicillin resistant Staphylococcus aureus infection: Secondary | ICD-10-CM | POA: Insufficient documentation

## 2014-05-25 DIAGNOSIS — Z79899 Other long term (current) drug therapy: Secondary | ICD-10-CM | POA: Diagnosis not present

## 2014-05-25 DIAGNOSIS — I10 Essential (primary) hypertension: Secondary | ICD-10-CM | POA: Insufficient documentation

## 2014-05-25 DIAGNOSIS — R5381 Other malaise: Secondary | ICD-10-CM | POA: Diagnosis not present

## 2014-05-25 DIAGNOSIS — E119 Type 2 diabetes mellitus without complications: Secondary | ICD-10-CM

## 2014-05-25 DIAGNOSIS — E538 Deficiency of other specified B group vitamins: Secondary | ICD-10-CM | POA: Diagnosis not present

## 2014-05-25 DIAGNOSIS — E7800 Pure hypercholesterolemia, unspecified: Secondary | ICD-10-CM | POA: Insufficient documentation

## 2014-05-25 DIAGNOSIS — E1142 Type 2 diabetes mellitus with diabetic polyneuropathy: Secondary | ICD-10-CM | POA: Diagnosis not present

## 2014-05-25 LAB — CBC WITH DIFFERENTIAL/PLATELET
BASOS ABS: 0 10*3/uL (ref 0.0–0.1)
Basophils Relative: 0 % (ref 0–1)
EOS PCT: 2 % (ref 0–5)
Eosinophils Absolute: 0.2 10*3/uL (ref 0.0–0.7)
HCT: 43.2 % (ref 39.0–52.0)
Hemoglobin: 14.1 g/dL (ref 13.0–17.0)
Lymphocytes Relative: 12 % (ref 12–46)
Lymphs Abs: 1.4 10*3/uL (ref 0.7–4.0)
MCH: 31 pg (ref 26.0–34.0)
MCHC: 32.6 g/dL (ref 30.0–36.0)
MCV: 94.9 fL (ref 78.0–100.0)
Monocytes Absolute: 0.7 10*3/uL (ref 0.1–1.0)
Monocytes Relative: 6 % (ref 3–12)
NEUTROS PCT: 80 % — AB (ref 43–77)
Neutro Abs: 9 10*3/uL — ABNORMAL HIGH (ref 1.7–7.7)
Platelets: 316 10*3/uL (ref 150–400)
RBC: 4.55 MIL/uL (ref 4.22–5.81)
RDW: 14.2 % (ref 11.5–15.5)
WBC: 11.3 10*3/uL — ABNORMAL HIGH (ref 4.0–10.5)

## 2014-05-25 LAB — I-STAT TROPONIN, ED: TROPONIN I, POC: 0 ng/mL (ref 0.00–0.08)

## 2014-05-25 LAB — BASIC METABOLIC PANEL
BUN: 17 mg/dL (ref 6–23)
CO2: 25 mEq/L (ref 19–32)
Calcium: 9.8 mg/dL (ref 8.4–10.5)
Chloride: 96 mEq/L (ref 96–112)
Creatinine, Ser: 0.68 mg/dL (ref 0.50–1.35)
GFR calc Af Amer: >90 mL/min (ref >90)
Glucose, Bld: 155 mg/dL — ABNORMAL HIGH (ref 70–99)
POTASSIUM: 4.6 meq/L (ref 3.7–5.3)
SODIUM: 136 meq/L — AB (ref 137–147)

## 2014-05-25 LAB — CBG MONITORING, ED
Glucose-Capillary: 161 mg/dL — ABNORMAL HIGH (ref 70–99)
Glucose-Capillary: 138 mg/dL — ABNORMAL HIGH (ref 70–99)

## 2014-05-25 MED ORDER — CYCLOBENZAPRINE HCL 10 MG PO TABS: 10.0000 mg | ORAL_TABLET | Freq: Two times a day (BID) | ORAL | Status: DC | PRN

## 2014-05-25 MED ORDER — TRAMADOL HCL 50 MG PO TABS: 50.0000 mg | ORAL_TABLET | Freq: Four times a day (QID) | ORAL | Status: DC | PRN

## 2014-05-25 MED ORDER — OXYCODONE-ACETAMINOPHEN 5-325 MG PO TABS
2.0000 | ORAL_TABLET | Freq: Once | ORAL | Status: AC
  Administered 2014-05-25: 2 via ORAL
  Filled 2014-05-25: qty 2

## 2014-05-25 NOTE — ED Notes (Signed)
Reviewed EKG with Dr. Cheri Guppy. No new orders received at this time

## 2014-05-25 NOTE — ED Provider Notes (Signed)
CSN: [id number]     Arrival date & time 05/25/14  0355 History   First MD Initiated Contact with Patient 05/25/14 0555     Chief Complaint  Patient presents with  . Weakness     (Consider location/radiation/quality/duration/timing/severity/associated sxs/prior Treatment) HPI This patient is a pleasant man in his late 82s of several chronic medical problems including paroxysmal atrial fibrillation, history of coronary artery disease, hypertension, obesity and type 2 diabetes.  He presents with concern after he felt some fluttering in his chest. The patient was recently changed from [place] to Plavix as an anticoagulant when he developed a severe rash which was thought to be secondary to Xarelto. The patient says that, in this context, he was particularly concerned that he may be experiencing recurrent AF when he felt palpitations. Papitations/fluttering lasted < 40m and resolved without intervention. No recurrence.   The patient also points out that he has had a nagging pain in the region of his trapezius muscles bilaterally and keeps feeling like he is about to get a headache. Although, he denies an actual experience of headache. He says he sometimes feels like a headache is "coming on" in a band like distribution across his head - particularly when he wears a hat or flexes his neck. He has not experienced any focal neurologic deficits. No CP or SOB.   Past Medical History  Diagnosis Date  . CAD (coronary artery disease)     a. Approx. 2000 - MI. Cath showed single vessel disease, PTCA dLAD/medical management. ;  b. NSTEMI 11/13 => LHC: prox and mid LAD 30%, dLAD 60%, pCFX 30%, inf branch of OM 40%, mRCA 50-60%, EF 55-60%; IVUS attempted for RCA but not successful; anatomy felt stable from 2000 => med Rx.  . Diverticulitis   . Hypertension   . Obesity   . CVA (cerebral infarction)     a. Approx. 2002  . Cholelithiasis   . Hypercholesterolemia   . ED (erectile dysfunction)   . OSA on CPAP    . Type II diabetes mellitus   . History of stomach ulcers     "issues a long long time ago" (10/26/2012)  . History of MRSA infection     "little toe right foot" (10/26/2012)  . Tunnel vision     "both eyes since stroke"  . 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 Claustrophobia   . B12 deficiency   . Diabetic neuropathy    Past Surgical History  Procedure Laterality Date  . Toe amputation  2006; 2009    "Dr. Blenda Mounts; big toe left foot; little toe on right foot" (10/26/2012)  . Cardiac catheterization  1990's  . Cerebral angiogram  ~ 2000   Family History  Problem Relation Age of Onset  . Asthma Neg Hx   . Cancer Mother 34    breast  . Heart disease Father 16    heart   History  Substance Use Topics  . Smoking status: Former Smoker -- 1.00 packs/day for 30 years    Types: Cigarettes, Cigars  . Smokeless tobacco: Current User    Types: Snuff     Comment: 10/26/2012 "quit smoking cigarettes  in ~ 1996; wife smokes in the house"  . Alcohol Use: Yes     Comment: 10/26/2012 "have a drink maybe twice/yr"    Review of Systems Ten point review of symptoms performed and is negative with the exception of symptoms noted  above.     Allergies  Xarelto  Home Medications   Prior to Admission medications   Medication Sig Start Date End Date Taking? Authorizing Provider  acetaminophen (TYLENOL) 500 MG tablet Take 1,000 mg by mouth at bedtime.   Yes Historical Provider, MD  aspirin 81 MG tablet Take 81 mg by mouth daily.     Yes Historical Provider, MD  Cholecalciferol (EQL VITAMIN D3) 1000 UNITS tablet Take 1,000 Units by mouth daily.     Yes Historical Provider, MD  clopidogrel (PLAVIX) 75 MG tablet Take 1 tablet (75 mg total) by mouth daily. 02/13/14  Yes Aleksei Plotnikov V, MD  furosemide (LASIX) 80 MG tablet Take 80 mg by mouth daily as needed. For fluid retention 05/26/11  Yes Aleksei Plotnikov V, MD  gabapentin (NEURONTIN)  300 MG capsule Take 300 mg by mouth 2 (two) times daily.   Yes Historical Provider, MD  glimepiride (AMARYL) 4 MG tablet Take 1 tablet (4 mg total) by mouth 2 (two) times daily. 05/15/14  Yes Aleksei Plotnikov V, MD  insulin lispro (HUMALOG PEN) 100 UNIT/ML KiwkPen 5-20 units sq tid ac 05/15/14  Yes Aleksei Plotnikov V, MD  losartan (COZAAR) 100 MG tablet Take 1 tablet (100 mg total) by mouth daily. 05/15/14 05/15/15 Yes Aleksei Plotnikov V, MD  metoprolol (LOPRESSOR) 50 MG tablet Take 1 tablet (50 mg total) by mouth 2 (two) times daily. 05/15/14  Yes Aleksei Plotnikov V, MD  nitroGLYCERIN (NITROSTAT) 0.4 MG SL tablet Place 0.4 mg under the tongue every 5 (five) minutes as needed. For chest pain 06/07/11  Yes Aleksei Plotnikov V, MD  pioglitazone (ACTOS) 45 MG tablet Take 1 tablet (45 mg total) by mouth daily. 05/15/14  Yes Aleksei Plotnikov V, MD  pravastatin (PRAVACHOL) 40 MG tablet Take 1 tablet (40 mg total) by mouth daily. 05/15/14  Yes Aleksei Plotnikov V, MD  triamcinolone cream (KENALOG) 0.5 % Apply 1 application topically 2 (two) times daily as needed (rash).   Yes Historical Provider, MD  vitamin B-12 (CYANOCOBALAMIN) 1000 MCG tablet Take 1,000 mcg by mouth daily.     Yes Historical Provider, MD   BP 119/47  Pulse 57  Temp(Src) 97.7 F (36.5 C) (Oral)  Resp 18  Ht 6\' 4"  (1.93 m)  Wt 349 lb (158.305 kg)  BMI 42.50 kg/m2  SpO2 100% Physical Exam Gen: well developed and well nourished appearing Head: NCAT Eyes: PERL, EOMI Nose: no epistaixis or rhinorrhea Mouth/throat: mucosa is moist and pink Neck: no midline ttp, supple, ttp with palpable trigger points over the trapezius m. Bilaterally, FROM of cervical spine, no meningismus Lungs: CTA B, no wheezing, rhonchi or rales CV: regular rate and rythm, good distal pulses.  Abd: soft, notender, nondistended Back: no ttp, no cva ttp Skin: warm and dry Ext: no edema, normal to inspection Neuro: CN ii-xii grossly intact, no focal deficits Psyche;  normal affect,  calm and cooperative.  ED Course  Procedures (including critical care time) Labs Review Labs Reviewed  BASIC METABOLIC PANEL - Abnormal; Notable for the following:    Sodium 136 (*)    Glucose, Bld 155 (*)    All other components within normal limits  CBC WITH DIFFERENTIAL - Abnormal; Notable for the following:    WBC 11.3 (*)    Neutrophils Relative % 80 (*)    Neutro Abs 9.0 (*)    All other components within normal limits  CBG MONITORING, ED - Abnormal; Notable for the following:    Glucose-Capillary 161 (*)  All other components within normal limits  CBG MONITORING, ED - Abnormal; Notable for the following:    Glucose-Capillary 138 (*)    All other components within normal limits  I-STAT TROPOININ, ED    Imaging Review Dg Chest 2 View  05/25/2014   CLINICAL DATA:  Weakness  EXAM: CHEST  2 VIEW  COMPARISON:  10/26/2012  FINDINGS: The heart size appears normal. There is no pleural effusion or edema. No airspace consolidation. Calcified atherosclerotic disease involves the thoracic aorta.  IMPRESSION: 1. No acute cardiopulmonary abnormalities. 2. Atherosclerosis.   Electronically Signed   By: Kerby Moors M.D.   On: 05/25/2014 07:48    EKG: nsr, no acute ischemic changes, normal intervals, normal axis, normal qrs complex  MDM   Patient with early signs of tension headache. He feels better after tx with Percocet. He has NSR on EKG and on telemetry. Do NOT suspect SAH or meningitis. The patient is reassured and is stable for discharge with plan to f/u with his PCP on     Elyn Peers, MD 05/25/14 [phone]

## 2014-05-25 NOTE — ED Notes (Signed)
Patient reports he felt his heart "fluttering" and just felt funny.  History of A-fib a year ago, currently taking plavix.  On monitor, normal sinus rhythm with rate of 64.

## 2014-05-25 NOTE — ED Notes (Signed)
Phlebotomy at the bedside  

## 2014-06-04 ENCOUNTER — Ambulatory Visit (INDEPENDENT_AMBULATORY_CARE_PROVIDER_SITE_OTHER): Payer: Medicare Other | Admitting: Internal Medicine

## 2014-06-04 ENCOUNTER — Encounter: Payer: Self-pay | Admitting: Internal Medicine

## 2014-06-04 ENCOUNTER — Other Ambulatory Visit (INDEPENDENT_AMBULATORY_CARE_PROVIDER_SITE_OTHER): Payer: Medicare Other

## 2014-06-04 VITALS — BP 130/70 | HR 72 | Temp 97.7°F | Resp 16 | Wt 350.0 lb

## 2014-06-04 DIAGNOSIS — E538 Deficiency of other specified B group vitamins: Secondary | ICD-10-CM

## 2014-06-04 DIAGNOSIS — I4891 Unspecified atrial fibrillation: Secondary | ICD-10-CM

## 2014-06-04 DIAGNOSIS — R519 Headache, unspecified: Secondary | ICD-10-CM | POA: Insufficient documentation

## 2014-06-04 DIAGNOSIS — R51 Headache: Secondary | ICD-10-CM

## 2014-06-04 DIAGNOSIS — I10 Essential (primary) hypertension: Secondary | ICD-10-CM

## 2014-06-04 DIAGNOSIS — I480 Paroxysmal atrial fibrillation: Secondary | ICD-10-CM

## 2014-06-04 DIAGNOSIS — E119 Type 2 diabetes mellitus without complications: Secondary | ICD-10-CM

## 2014-06-04 DIAGNOSIS — E871 Hypo-osmolality and hyponatremia: Secondary | ICD-10-CM | POA: Diagnosis not present

## 2014-06-04 DIAGNOSIS — R42 Dizziness and giddiness: Secondary | ICD-10-CM

## 2014-06-04 LAB — BASIC METABOLIC PANEL
BUN: 17 mg/dL (ref 6–23)
CALCIUM: 9.4 mg/dL (ref 8.4–10.5)
CO2: 31 meq/L (ref 19–32)
Chloride: 96 mEq/L (ref 96–112)
Creatinine, Ser: 1 mg/dL (ref 0.4–1.5)
GFR: 82.65 mL/min (ref >60.00)
Glucose, Bld: 124 mg/dL — ABNORMAL HIGH (ref 70–99)
Potassium: 4.8 mEq/L (ref 3.5–5.1)
SODIUM: 135 meq/L (ref 135–145)

## 2014-06-04 NOTE — Assessment & Plan Note (Signed)
CT head Labs 

## 2014-06-04 NOTE — Assessment & Plan Note (Signed)
Agreed to take Plavix

## 2014-06-04 NOTE — Progress Notes (Signed)
Pre visit review using our clinic review tool, if applicable. No additional management support is needed unless otherwise documented below in the visit note. 

## 2014-06-04 NOTE — Progress Notes (Signed)
Subjective:   HPI  F/u ER visit on 05/25/14 for:  "Patient with early signs of tension headache. He feels better after tx with Percocet. He has NSR on EKG and on telemetry. Do NOT suspect SAH or meningitis. The patient is reassured and is stable for discharge with plan to f/u with his PCP" He was given Tramadol - out of it now...  C/o HA or a "heavy head" a lot; back of the head HA x 2 months overall    F/u rash on arms and torso - new It started when he started Xarelto in January.... The patient presents for a follow-up of  chronic hypertension, chronic dyslipidemia, type 2 diabetes controlled with medicines. F/u neuropathy  Charles [name] wants to stop Xarelto due to cost and lawyers adds on TV and to go back on Plavix...  BP Readings from Last 3 Encounters:  06/04/14 130/70  05/25/14 119/66  05/21/14 157/76   Wt Readings from Last 3 Encounters:  06/04/14 350 lb (158.759 kg)  05/25/14 349 lb (158.305 kg)  05/15/14 349 lb 6 oz (158.475 kg)     Review of Systems  Constitutional: Negative for appetite change, fatigue and unexpected weight change.  HENT: Negative for congestion, nosebleeds, sneezing, sore throat and trouble swallowing.   Eyes: Negative for itching and visual disturbance.  Respiratory: Negative for cough.   Cardiovascular: Negative for chest pain, palpitations and leg swelling.  Gastrointestinal: Negative for nausea, diarrhea, blood in stool and abdominal distention.  Genitourinary: Negative for frequency and hematuria.  Musculoskeletal: Negative for back pain, gait problem, joint swelling and neck pain.  Skin: Negative for rash.  Neurological: Negative for dizziness, tremors, speech difficulty and weakness.  Psychiatric/Behavioral: Negative for sleep disturbance, dysphoric mood and agitation. The patient is not nervous/anxious.        Objective:   Physical Exam  Constitutional: He is oriented to person, place, and time. He appears well-developed.  obese   HENT:  Mouth/Throat: Oropharynx is clear and moist.  Hard hearing  Eyes: Conjunctivae are normal. Pupils are equal, round, and reactive to light.  Neck: Normal range of motion. No JVD present. No thyromegaly present.  Cardiovascular: Normal rate, regular rhythm, normal heart sounds and intact distal pulses.  Exam reveals no gallop and no friction rub.   No murmur heard. Pulmonary/Chest: Effort normal and breath sounds normal. No respiratory distress. He has no wheezes. He has no rales. He exhibits no tenderness.  Abdominal: Soft. Bowel sounds are normal. He exhibits no distension and no mass. There is no tenderness. There is no rebound and no guarding.  Musculoskeletal: Normal range of motion. He exhibits edema (trace  B ankles). He exhibits no tenderness.  Lymphadenopathy:    He has no cervical adenopathy.  Neurological: He is alert and oriented to person, place, and time. He has normal reflexes. No cranial nerve deficit. He exhibits normal muscle tone. Coordination normal.  Skin: Skin is warm and dry. No rash noted. No erythema.  B forearms, trunk  Psychiatric: He has a normal mood and affect. His behavior is normal. Judgment and thought content normal.    Lab Results  Component Value Date   WBC 11.3* 05/25/2014   HGB 14.1 05/25/2014   HCT 43.2 05/25/2014   PLT 316 05/25/2014   GLUCOSE 155* 05/25/2014   CHOL 144 10/27/2012   TRIG 76 10/27/2012   HDL 43 10/27/2012   LDLCALC 86 10/27/2012   ALT 19 03/30/2013   AST 27 03/30/2013   NA 136*  05/25/2014   K 4.6 05/25/2014   CL 96 05/25/2014   CREATININE 0.68 05/25/2014   BUN 17 05/25/2014   CO2 25 05/25/2014   TSH 1.195 10/26/2012   PSA 0.96 08/28/2010   INR 1.13 10/27/2012   HGBA1C 7.9* 05/15/2014   MICROALBUR 1.4 08/28/2010         Assessment & Plan:

## 2014-06-04 NOTE — Assessment & Plan Note (Signed)
Continue with current prescription therapy as reflected on the Med list.  

## 2014-06-04 NOTE — Assessment & Plan Note (Signed)
6/15 mild BMET

## 2014-06-04 NOTE — Patient Instructions (Signed)
Foam contour pillow  Wt Readings from Last 3 Encounters:  06/04/14 350 lb (158.759 kg)  05/25/14 349 lb (158.305 kg)  05/15/14 349 lb 6 oz (158.475 kg)

## 2014-06-04 NOTE — Assessment & Plan Note (Signed)
BMET 

## 2014-06-04 NOTE — Assessment & Plan Note (Addendum)
New: started in 5/ 2015 ?etiol CT head Foam contour pillow

## 2014-06-05 ENCOUNTER — Telehealth: Payer: Self-pay | Admitting: Internal Medicine

## 2014-06-05 NOTE — Telephone Encounter (Signed)
Relevant patient education assigned to patient using Emmi. ° °

## 2014-06-08 ENCOUNTER — Encounter: Payer: Self-pay | Admitting: Internal Medicine

## 2014-06-11 ENCOUNTER — Telehealth: Payer: Self-pay | Admitting: *Deleted

## 2014-06-11 ENCOUNTER — Ambulatory Visit (INDEPENDENT_AMBULATORY_CARE_PROVIDER_SITE_OTHER)
Admission: RE | Admit: 2014-06-11 | Discharge: 2014-06-11 | Disposition: A | Discharge status: Home / Self Care | Payer: Medicare Other | Source: Ambulatory Visit | Attending: Internal Medicine | Admitting: Internal Medicine

## 2014-06-11 DIAGNOSIS — R51 Headache: Secondary | ICD-10-CM

## 2014-06-11 DIAGNOSIS — E119 Type 2 diabetes mellitus without complications: Secondary | ICD-10-CM

## 2014-06-11 NOTE — Telephone Encounter (Signed)
Patient aware to come in fasting for next office visit.  Lipid panel ordered.

## 2014-07-02 ENCOUNTER — Ambulatory Visit (INDEPENDENT_AMBULATORY_CARE_PROVIDER_SITE_OTHER): Payer: Medicare Other | Admitting: Internal Medicine

## 2014-07-02 ENCOUNTER — Encounter: Payer: Self-pay | Admitting: Internal Medicine

## 2014-07-02 VITALS — BP 120/64 | HR 82 | Temp 97.5°F | Ht 76.0 in | Wt 358.4 lb

## 2014-07-02 DIAGNOSIS — I10 Essential (primary) hypertension: Secondary | ICD-10-CM | POA: Diagnosis not present

## 2014-07-02 DIAGNOSIS — E11319 Type 2 diabetes mellitus with unspecified diabetic retinopathy without macular edema: Secondary | ICD-10-CM

## 2014-07-02 DIAGNOSIS — I480 Paroxysmal atrial fibrillation: Secondary | ICD-10-CM

## 2014-07-02 DIAGNOSIS — E1139 Type 2 diabetes mellitus with other diabetic ophthalmic complication: Secondary | ICD-10-CM | POA: Diagnosis not present

## 2014-07-02 DIAGNOSIS — R51 Headache: Secondary | ICD-10-CM

## 2014-07-02 DIAGNOSIS — I4891 Unspecified atrial fibrillation: Secondary | ICD-10-CM

## 2014-07-02 NOTE — Progress Notes (Signed)
   Subjective:   HPI  F/u HA - better...   C/o HA or a "heavy head" a lot; back of the head HA x 2-3 months overall. It was heat triggered - per pt. CT was OK.    F/u rash on arms and torso - gone off Xarelto  Mr Woodrome wants to stop Xarelto due to cost and lawyers adds on TV and to go back on Plavix...  BP Readings from Last 3 Encounters:  07/02/14 120/64  06/04/14 130/70  05/25/14 119/66   Wt Readings from Last 3 Encounters:  07/02/14 358 lb 6.4 oz (162.569 kg)  06/04/14 350 lb (158.759 kg)  05/25/14 349 lb (158.305 kg)     Review of Systems  Constitutional: Negative for appetite change, fatigue and unexpected weight change.  HENT: Negative for congestion, nosebleeds, sneezing, sore throat and trouble swallowing.   Eyes: Negative for itching and visual disturbance.  Respiratory: Negative for cough.   Cardiovascular: Negative for chest pain, palpitations and leg swelling.  Gastrointestinal: Negative for nausea, diarrhea, blood in stool and abdominal distention.  Genitourinary: Negative for frequency and hematuria.  Musculoskeletal: Negative for back pain, gait problem, joint swelling and neck pain.  Skin: Negative for rash.  Neurological: Negative for dizziness, tremors, speech difficulty and weakness.  Psychiatric/Behavioral: Negative for sleep disturbance, dysphoric mood and agitation. The patient is not nervous/anxious.        Objective:   Physical Exam  Constitutional: He is oriented to person, place, and time. He appears well-developed.  obese  HENT:  Mouth/Throat: Oropharynx is clear and moist.  Hard hearing  Eyes: Conjunctivae are normal. Pupils are equal, round, and reactive to light.  Neck: Normal range of motion. No JVD present. No thyromegaly present.  Cardiovascular: Normal rate, regular rhythm, normal heart sounds and intact distal pulses.  Exam reveals no gallop and no friction rub.   No murmur heard. Pulmonary/Chest: Effort normal and breath  sounds normal. No respiratory distress. He has no wheezes. He has no rales. He exhibits no tenderness.  Abdominal: Soft. Bowel sounds are normal. He exhibits no distension and no mass. There is no tenderness. There is no rebound and no guarding.  Musculoskeletal: Normal range of motion. He exhibits edema (trace  B ankles). He exhibits no tenderness.  Lymphadenopathy:    He has no cervical adenopathy.  Neurological: He is alert and oriented to person, place, and time. He has normal reflexes. No cranial nerve deficit. He exhibits normal muscle tone. Coordination normal.  Skin: Skin is warm and dry. No rash noted. No erythema.  No rash  Psychiatric: He has a normal mood and affect. His behavior is normal. Judgment and thought content normal.    Lab Results  Component Value Date   WBC 11.3* 05/25/2014   HGB 14.1 05/25/2014   HCT 43.2 05/25/2014   PLT 316 05/25/2014   GLUCOSE 124* 06/04/2014   CHOL 144 10/27/2012   TRIG 76 10/27/2012   HDL 43 10/27/2012   LDLCALC 86 10/27/2012   ALT 19 03/30/2013   AST 27 03/30/2013   NA 135 06/04/2014   K 4.8 06/04/2014   CL 96 06/04/2014   CREATININE 1.0 06/04/2014   BUN 17 06/04/2014   CO2 31 06/04/2014   TSH 1.195 10/26/2012   PSA 0.96 08/28/2010   INR 1.13 10/27/2012   HGBA1C 7.9* 05/15/2014   MICROALBUR 1.4 08/28/2010         Assessment & Plan:

## 2014-07-02 NOTE — Assessment & Plan Note (Signed)
HA or a "heavy head" a lot; back of the head HA x 2-3 months overall. It was heat triggered - per pt. CT was OK. Much better

## 2014-07-02 NOTE — Assessment & Plan Note (Signed)
Continue with current prescription therapy as reflected on the Med list.  

## 2014-07-02 NOTE — Progress Notes (Signed)
Pre visit review using our clinic review tool, if applicable. No additional management support is needed unless otherwise documented below in the visit note. 

## 2014-07-02 NOTE — Assessment & Plan Note (Signed)
Doing well 

## 2014-07-25 ENCOUNTER — Telehealth: Payer: Self-pay | Admitting: Internal Medicine

## 2014-07-25 ENCOUNTER — Inpatient Hospital Stay (HOSPITAL_COMMUNITY)
Admission: EM | Admit: 2014-07-25 | Discharge: 2014-08-06 | DRG: 871 | Disposition: A | Discharge status: Home / Self Care | Payer: Medicare Other | Attending: Internal Medicine | Admitting: Internal Medicine

## 2014-07-25 ENCOUNTER — Emergency Department (HOSPITAL_COMMUNITY): Payer: Medicare Other

## 2014-07-25 ENCOUNTER — Encounter (HOSPITAL_COMMUNITY): Payer: Self-pay | Admitting: Emergency Medicine

## 2014-07-25 DIAGNOSIS — I252 Old myocardial infarction: Secondary | ICD-10-CM | POA: Diagnosis not present

## 2014-07-25 DIAGNOSIS — Z888 Allergy status to other drugs, medicaments and biological substances status: Secondary | ICD-10-CM | POA: Diagnosis not present

## 2014-07-25 DIAGNOSIS — I498 Other specified cardiac arrhythmias: Secondary | ICD-10-CM | POA: Diagnosis not present

## 2014-07-25 DIAGNOSIS — Y846 Urinary catheterization as the cause of abnormal reaction of the patient, or of later complication, without mention of misadventure at the time of the procedure: Secondary | ICD-10-CM | POA: Diagnosis present

## 2014-07-25 DIAGNOSIS — E872 Acidosis, unspecified: Secondary | ICD-10-CM | POA: Diagnosis present

## 2014-07-25 DIAGNOSIS — J9600 Acute respiratory failure, unspecified whether with hypoxia or hypercapnia: Secondary | ICD-10-CM | POA: Diagnosis not present

## 2014-07-25 DIAGNOSIS — T83091A Other mechanical complication of indwelling urethral catheter, initial encounter: Secondary | ICD-10-CM | POA: Diagnosis present

## 2014-07-25 DIAGNOSIS — Z6841 Body Mass Index (BMI) 40.0 and over, adult: Secondary | ICD-10-CM

## 2014-07-25 DIAGNOSIS — H547 Unspecified visual loss: Secondary | ICD-10-CM | POA: Diagnosis present

## 2014-07-25 DIAGNOSIS — Z87891 Personal history of nicotine dependence: Secondary | ICD-10-CM | POA: Diagnosis not present

## 2014-07-25 DIAGNOSIS — Z803 Family history of malignant neoplasm of breast: Secondary | ICD-10-CM | POA: Diagnosis not present

## 2014-07-25 DIAGNOSIS — R3915 Urgency of urination: Secondary | ICD-10-CM | POA: Diagnosis not present

## 2014-07-25 DIAGNOSIS — Z7982 Long term (current) use of aspirin: Secondary | ICD-10-CM | POA: Diagnosis not present

## 2014-07-25 DIAGNOSIS — R609 Edema, unspecified: Secondary | ICD-10-CM

## 2014-07-25 DIAGNOSIS — R7881 Bacteremia: Secondary | ICD-10-CM

## 2014-07-25 DIAGNOSIS — I5031 Acute diastolic (congestive) heart failure: Secondary | ICD-10-CM | POA: Diagnosis present

## 2014-07-25 DIAGNOSIS — R338 Other retention of urine: Secondary | ICD-10-CM

## 2014-07-25 DIAGNOSIS — E1149 Type 2 diabetes mellitus with other diabetic neurological complication: Secondary | ICD-10-CM | POA: Diagnosis present

## 2014-07-25 DIAGNOSIS — I4891 Unspecified atrial fibrillation: Secondary | ICD-10-CM | POA: Diagnosis present

## 2014-07-25 DIAGNOSIS — H9190 Unspecified hearing loss, unspecified ear: Secondary | ICD-10-CM | POA: Diagnosis present

## 2014-07-25 DIAGNOSIS — Z7902 Long term (current) use of antithrombotics/antiplatelets: Secondary | ICD-10-CM | POA: Diagnosis not present

## 2014-07-25 DIAGNOSIS — E118 Type 2 diabetes mellitus with unspecified complications: Secondary | ICD-10-CM

## 2014-07-25 DIAGNOSIS — E785 Hyperlipidemia, unspecified: Secondary | ICD-10-CM | POA: Diagnosis present

## 2014-07-25 DIAGNOSIS — I2510 Atherosclerotic heart disease of native coronary artery without angina pectoris: Secondary | ICD-10-CM | POA: Diagnosis present

## 2014-07-25 DIAGNOSIS — I959 Hypotension, unspecified: Secondary | ICD-10-CM | POA: Diagnosis present

## 2014-07-25 DIAGNOSIS — G4733 Obstructive sleep apnea (adult) (pediatric): Secondary | ICD-10-CM | POA: Diagnosis present

## 2014-07-25 DIAGNOSIS — Z8673 Personal history of transient ischemic attack (TIA), and cerebral infarction without residual deficits: Secondary | ICD-10-CM | POA: Diagnosis not present

## 2014-07-25 DIAGNOSIS — IMO0002 Reserved for concepts with insufficient information to code with codable children: Secondary | ICD-10-CM | POA: Diagnosis present

## 2014-07-25 DIAGNOSIS — N3289 Other specified disorders of bladder: Secondary | ICD-10-CM | POA: Diagnosis not present

## 2014-07-25 DIAGNOSIS — E1165 Type 2 diabetes mellitus with hyperglycemia: Secondary | ICD-10-CM | POA: Diagnosis present

## 2014-07-25 DIAGNOSIS — I480 Paroxysmal atrial fibrillation: Secondary | ICD-10-CM | POA: Diagnosis present

## 2014-07-25 DIAGNOSIS — E869 Volume depletion, unspecified: Secondary | ICD-10-CM | POA: Diagnosis present

## 2014-07-25 DIAGNOSIS — R6520 Severe sepsis without septic shock: Secondary | ICD-10-CM

## 2014-07-25 DIAGNOSIS — E11319 Type 2 diabetes mellitus with unspecified diabetic retinopathy without macular edema: Secondary | ICD-10-CM | POA: Diagnosis present

## 2014-07-25 DIAGNOSIS — R918 Other nonspecific abnormal finding of lung field: Secondary | ICD-10-CM | POA: Diagnosis not present

## 2014-07-25 DIAGNOSIS — S98139A Complete traumatic amputation of one unspecified lesser toe, initial encounter: Secondary | ICD-10-CM

## 2014-07-25 DIAGNOSIS — E1142 Type 2 diabetes mellitus with diabetic polyneuropathy: Secondary | ICD-10-CM | POA: Diagnosis present

## 2014-07-25 DIAGNOSIS — J9601 Acute respiratory failure with hypoxia: Secondary | ICD-10-CM

## 2014-07-25 DIAGNOSIS — I10 Essential (primary) hypertension: Secondary | ICD-10-CM | POA: Diagnosis not present

## 2014-07-25 DIAGNOSIS — Z9861 Coronary angioplasty status: Secondary | ICD-10-CM | POA: Diagnosis not present

## 2014-07-25 DIAGNOSIS — R6521 Severe sepsis with septic shock: Secondary | ICD-10-CM

## 2014-07-25 DIAGNOSIS — J189 Pneumonia, unspecified organism: Secondary | ICD-10-CM | POA: Diagnosis not present

## 2014-07-25 DIAGNOSIS — J14 Pneumonia due to Hemophilus influenzae: Secondary | ICD-10-CM | POA: Diagnosis present

## 2014-07-25 DIAGNOSIS — R339 Retention of urine, unspecified: Secondary | ICD-10-CM | POA: Diagnosis present

## 2014-07-25 DIAGNOSIS — R579 Shock, unspecified: Secondary | ICD-10-CM | POA: Diagnosis present

## 2014-07-25 DIAGNOSIS — E1139 Type 2 diabetes mellitus with other diabetic ophthalmic complication: Secondary | ICD-10-CM | POA: Diagnosis present

## 2014-07-25 DIAGNOSIS — Z794 Long term (current) use of insulin: Secondary | ICD-10-CM | POA: Diagnosis not present

## 2014-07-25 DIAGNOSIS — I517 Cardiomegaly: Secondary | ICD-10-CM | POA: Diagnosis not present

## 2014-07-25 DIAGNOSIS — S3720XA Unspecified injury of bladder, initial encounter: Secondary | ICD-10-CM | POA: Diagnosis not present

## 2014-07-25 DIAGNOSIS — E8809 Other disorders of plasma-protein metabolism, not elsewhere classified: Secondary | ICD-10-CM | POA: Diagnosis present

## 2014-07-25 DIAGNOSIS — I509 Heart failure, unspecified: Secondary | ICD-10-CM | POA: Diagnosis present

## 2014-07-25 DIAGNOSIS — J811 Chronic pulmonary edema: Secondary | ICD-10-CM | POA: Diagnosis present

## 2014-07-25 DIAGNOSIS — E1159 Type 2 diabetes mellitus with other circulatory complications: Secondary | ICD-10-CM

## 2014-07-25 DIAGNOSIS — R319 Hematuria, unspecified: Secondary | ICD-10-CM | POA: Diagnosis not present

## 2014-07-25 DIAGNOSIS — J449 Chronic obstructive pulmonary disease, unspecified: Secondary | ICD-10-CM | POA: Diagnosis present

## 2014-07-25 DIAGNOSIS — A419 Sepsis, unspecified organism: Secondary | ICD-10-CM | POA: Diagnosis present

## 2014-07-25 DIAGNOSIS — J441 Chronic obstructive pulmonary disease with (acute) exacerbation: Secondary | ICD-10-CM | POA: Diagnosis present

## 2014-07-25 DIAGNOSIS — R0602 Shortness of breath: Secondary | ICD-10-CM | POA: Diagnosis not present

## 2014-07-25 HISTORY — DX: Paroxysmal atrial fibrillation: I48.0

## 2014-07-25 HISTORY — DX: Morbid (severe) obesity due to excess calories: E66.01

## 2014-07-25 LAB — I-STAT TROPONIN, ED: Troponin i, poc: 0.04 ng/mL (ref 0.00–0.08)

## 2014-07-25 LAB — BASIC METABOLIC PANEL
Anion gap: 13 (ref 5–15)
BUN: 19 mg/dL (ref 6–23)
CHLORIDE: 101 meq/L (ref 96–112)
CO2: 27 meq/L (ref 19–32)
CREATININE: 0.83 mg/dL (ref 0.50–1.35)
Calcium: 8.5 mg/dL (ref 8.4–10.5)
GFR calc Af Amer: >90 mL/min (ref >90)
GFR calc non Af Amer: 86 mL/min — ABNORMAL LOW (ref >90)
Glucose, Bld: 114 mg/dL — ABNORMAL HIGH (ref 70–99)
Potassium: 3.9 mEq/L (ref 3.7–5.3)
Sodium: 141 mEq/L (ref 137–147)

## 2014-07-25 LAB — CBC WITH DIFFERENTIAL/PLATELET
BASOS ABS: 0 10*3/uL (ref 0.0–0.1)
Basophils Relative: 0 % (ref 0–1)
Eosinophils Absolute: 0 10*3/uL (ref 0.0–0.7)
Eosinophils Relative: 0 % (ref 0–5)
HCT: 36.8 % — ABNORMAL LOW (ref 39.0–52.0)
Hemoglobin: 11.6 g/dL — ABNORMAL LOW (ref 13.0–17.0)
LYMPHS PCT: 9 % — AB (ref 12–46)
Lymphs Abs: 0.9 10*3/uL (ref 0.7–4.0)
MCH: 30.6 pg (ref 26.0–34.0)
MCHC: 31.5 g/dL (ref 30.0–36.0)
MCV: 97.1 fL (ref 78.0–100.0)
MONO ABS: 0.4 10*3/uL (ref 0.1–1.0)
Monocytes Relative: 4 % (ref 3–12)
NEUTROS ABS: 8.3 10*3/uL — AB (ref 1.7–7.7)
Neutrophils Relative %: 87 % — ABNORMAL HIGH (ref 43–77)
Platelets: 264 10*3/uL (ref 150–400)
RBC: 3.79 MIL/uL — ABNORMAL LOW (ref 4.22–5.81)
RDW: 16 % — ABNORMAL HIGH (ref 11.5–15.5)
WBC: 9.5 10*3/uL (ref 4.0–10.5)

## 2014-07-25 LAB — LACTIC ACID, PLASMA: Lactic Acid, Venous: 4.7 mmol/L — ABNORMAL HIGH (ref 0.5–2.2)

## 2014-07-25 LAB — I-STAT ARTERIAL BLOOD GAS, ED
Bicarbonate: 23.7 mEq/L (ref 20.0–24.0)
O2 SAT: 94 %
TCO2: 25 mmol/L (ref 0–100)
pCO2 arterial: 34.7 mmHg — ABNORMAL LOW (ref 35.0–45.0)
pH, Arterial: 7.443 (ref 7.350–7.450)
pO2, Arterial: 66 mmHg — ABNORMAL LOW (ref 80.0–100.0)

## 2014-07-25 LAB — STREP PNEUMONIAE URINARY ANTIGEN: STREP PNEUMO URINARY ANTIGEN: NEGATIVE

## 2014-07-25 LAB — GLUCOSE, CAPILLARY: Glucose-Capillary: 358 mg/dL — ABNORMAL HIGH (ref 70–99)

## 2014-07-25 LAB — MRSA PCR SCREENING: MRSA by PCR: NEGATIVE

## 2014-07-25 LAB — PROCALCITONIN: PROCALCITONIN: 0.47 ng/mL

## 2014-07-25 LAB — PRO B NATRIURETIC PEPTIDE: Pro B Natriuretic peptide (BNP): 1647 pg/mL — ABNORMAL HIGH (ref 0–125)

## 2014-07-25 LAB — I-STAT CG4 LACTIC ACID, ED: LACTIC ACID, VENOUS: 2.57 mmol/L — AB (ref 0.5–2.2)

## 2014-07-25 LAB — TROPONIN I: Troponin I: <0.3 ng/mL (ref <0.30)

## 2014-07-25 MED ORDER — ONDANSETRON HCL 4 MG/2ML IJ SOLN: 4.0000 mg | Freq: Four times a day (QID) | INTRAMUSCULAR | Status: DC | PRN

## 2014-07-25 MED ORDER — ACETAMINOPHEN 325 MG PO TABS
650.0000 mg | ORAL_TABLET | Freq: Four times a day (QID) | ORAL | Status: DC | PRN
  Administered 2014-07-30 – 2014-08-02 (×3): 650 mg via ORAL
  Filled 2014-07-25 (×3): qty 2

## 2014-07-25 MED ORDER — SODIUM CHLORIDE 0.9 % IJ SOLN
3.0000 mL | Freq: Two times a day (BID) | INTRAMUSCULAR | Status: DC
  Administered 2014-07-25 – 2014-08-06 (×18): 3 mL via INTRAVENOUS

## 2014-07-25 MED ORDER — VANCOMYCIN HCL 10 G IV SOLR
2500.0000 mg | INTRAVENOUS | Status: AC
  Administered 2014-07-25: 2500 mg via INTRAVENOUS
  Filled 2014-07-25: qty 2500

## 2014-07-25 MED ORDER — SODIUM CHLORIDE 0.9 % IV BOLUS (SEPSIS)
1000.0000 mL | Freq: Once | INTRAVENOUS | Status: AC
  Administered 2014-07-25: 1000 mL via INTRAVENOUS

## 2014-07-25 MED ORDER — LEVOFLOXACIN IN D5W 750 MG/150ML IV SOLN
750.0000 mg | Freq: Once | INTRAVENOUS | Status: AC
  Administered 2014-07-25: 750 mg via INTRAVENOUS
  Filled 2014-07-25: qty 150

## 2014-07-25 MED ORDER — SODIUM CHLORIDE 0.9 % IV SOLN
INTRAVENOUS | Status: DC
  Administered 2014-07-25 – 2014-07-26 (×2): via INTRAVENOUS

## 2014-07-25 MED ORDER — OXYCODONE HCL 5 MG PO TABS
5.0000 mg | ORAL_TABLET | ORAL | Status: DC | PRN
  Administered 2014-07-28 – 2014-08-05 (×11): 5 mg via ORAL
  Filled 2014-07-25 (×12): qty 1

## 2014-07-25 MED ORDER — ALBUTEROL (5 MG/ML) CONTINUOUS INHALATION SOLN
15.0000 mg/h | INHALATION_SOLUTION | Freq: Once | RESPIRATORY_TRACT | Status: AC
Start: 2014-07-25 — End: 2014-07-25
  Administered 2014-07-25: 15 mg/h via RESPIRATORY_TRACT
  Filled 2014-07-25: qty 20

## 2014-07-25 MED ORDER — LEVALBUTEROL HCL 0.63 MG/3ML IN NEBU
0.6300 mg | INHALATION_SOLUTION | Freq: Four times a day (QID) | RESPIRATORY_TRACT | Status: DC
  Administered 2014-07-25: 0.63 mg via RESPIRATORY_TRACT
  Filled 2014-07-25: qty 3

## 2014-07-25 MED ORDER — AZITHROMYCIN 500 MG IV SOLR
500.0000 mg | INTRAVENOUS | Status: DC
  Administered 2014-07-25: 500 mg via INTRAVENOUS
  Filled 2014-07-25 (×2): qty 500

## 2014-07-25 MED ORDER — LEVALBUTEROL HCL 0.63 MG/3ML IN NEBU
0.6300 mg | INHALATION_SOLUTION | Freq: Three times a day (TID) | RESPIRATORY_TRACT | Status: DC
  Administered 2014-07-26 – 2014-07-27 (×4): 0.63 mg via RESPIRATORY_TRACT
  Filled 2014-07-25 (×8): qty 3

## 2014-07-25 MED ORDER — GABAPENTIN 300 MG PO CAPS
300.0000 mg | ORAL_CAPSULE | Freq: Two times a day (BID) | ORAL | Status: DC
  Administered 2014-07-25 – 2014-08-06 (×24): 300 mg via ORAL
  Filled 2014-07-25 (×25): qty 1

## 2014-07-25 MED ORDER — LEVALBUTEROL HCL 0.63 MG/3ML IN NEBU
0.6300 mg | INHALATION_SOLUTION | RESPIRATORY_TRACT | Status: DC | PRN
  Administered 2014-07-29: 0.63 mg via RESPIRATORY_TRACT
  Filled 2014-07-25: qty 3

## 2014-07-25 MED ORDER — SODIUM CHLORIDE 0.9 % IV BOLUS (SEPSIS)
500.0000 mL | Freq: Once | INTRAVENOUS | Status: AC
  Administered 2014-07-25: 500 mL via INTRAVENOUS

## 2014-07-25 MED ORDER — INSULIN ASPART 100 UNIT/ML ~~LOC~~ SOLN
0.0000 [IU] | Freq: Every day | SUBCUTANEOUS | Status: DC
  Administered 2014-07-25: 5 [IU] via SUBCUTANEOUS

## 2014-07-25 MED ORDER — INSULIN ASPART 100 UNIT/ML ~~LOC~~ SOLN: 0.0000 [IU] | Freq: Three times a day (TID) | SUBCUTANEOUS | Status: DC

## 2014-07-25 MED ORDER — ASPIRIN 81 MG PO TABS: 81.0000 mg | ORAL_TABLET | Freq: Every day | ORAL | Status: DC

## 2014-07-25 MED ORDER — FUROSEMIDE 10 MG/ML IJ SOLN: 40.0000 mg | Freq: Once | INTRAMUSCULAR | Status: DC

## 2014-07-25 MED ORDER — CLOPIDOGREL BISULFATE 75 MG PO TABS
75.0000 mg | ORAL_TABLET | Freq: Every day | ORAL | Status: DC
  Administered 2014-07-25 – 2014-08-01 (×8): 75 mg via ORAL
  Filled 2014-07-25 (×8): qty 1

## 2014-07-25 MED ORDER — IPRATROPIUM BROMIDE 0.02 % IN SOLN
1.0000 mg | Freq: Once | RESPIRATORY_TRACT | Status: AC
  Administered 2014-07-25: 1 mg via RESPIRATORY_TRACT
  Filled 2014-07-25: qty 5

## 2014-07-25 MED ORDER — SIMVASTATIN 20 MG PO TABS
20.0000 mg | ORAL_TABLET | Freq: Every day | ORAL | Status: DC
  Administered 2014-07-25 – 2014-08-05 (×12): 20 mg via ORAL
  Filled 2014-07-25 (×13): qty 1

## 2014-07-25 MED ORDER — VANCOMYCIN HCL 10 G IV SOLR
1500.0000 mg | Freq: Two times a day (BID) | INTRAVENOUS | Status: DC
  Administered 2014-07-26 – 2014-07-27 (×4): 1500 mg via INTRAVENOUS
  Filled 2014-07-25 (×6): qty 1500

## 2014-07-25 MED ORDER — ASPIRIN EC 81 MG PO TBEC
81.0000 mg | DELAYED_RELEASE_TABLET | Freq: Every day | ORAL | Status: DC
  Administered 2014-07-25 – 2014-08-02 (×9): 81 mg via ORAL
  Filled 2014-07-25 (×9): qty 1

## 2014-07-25 MED ORDER — ACETAMINOPHEN 650 MG RE SUPP: 650.0000 mg | Freq: Four times a day (QID) | RECTAL | Status: DC | PRN

## 2014-07-25 MED ORDER — IPRATROPIUM-ALBUTEROL 0.5-2.5 (3) MG/3ML IN SOLN: 3.0000 mL | Freq: Once | RESPIRATORY_TRACT | Status: DC

## 2014-07-25 MED ORDER — CEFTRIAXONE SODIUM 1 G IJ SOLR
1.0000 g | INTRAMUSCULAR | Status: DC
  Administered 2014-07-25: 1 g via INTRAVENOUS
  Filled 2014-07-25 (×2): qty 10

## 2014-07-25 MED ORDER — ONDANSETRON HCL 4 MG PO TABS: 4.0000 mg | ORAL_TABLET | Freq: Four times a day (QID) | ORAL | Status: DC | PRN

## 2014-07-25 MED ORDER — ENOXAPARIN SODIUM 80 MG/0.8ML ~~LOC~~ SOLN
80.0000 mg | SUBCUTANEOUS | Status: DC
  Administered 2014-07-25 – 2014-07-29 (×5): 80 mg via SUBCUTANEOUS
  Filled 2014-07-25 (×6): qty 0.8

## 2014-07-25 NOTE — ED Notes (Signed)
Critical care MD at bedside 

## 2014-07-25 NOTE — ED Notes (Signed)
Attempted to call report. Floor RN unable to accept report.  

## 2014-07-25 NOTE — ED Notes (Signed)
Per EMS: pt coming from home with c/o respiratory distress. Pt found cool, clammy, diaphoretic, diminished lung sounds with fluid, dependent pitting edema, initial sat 72% on NRB, 88% on CPAP, Pt A&Ox4, respirations equal and labored, skin diaphoretic, cough since Tuesday morning, duo neb x1, 125 mg solumedrol, 20 LAC

## 2014-07-25 NOTE — Consult Note (Addendum)
Name: Charles [name] MRN: [id number] DOB: [birth year]    ADMISSION DATE:  07/25/2014 CONSULTATION DATE: 07/25/2014  REFERRING MD :  Dr. [place] Pink PRIMARY SERVICE:  TRH  CHIEF COMPLAINT:  SOB since Sunday.  BRIEF PATIENT DESCRIPTION: 73 year old male with extensive cardiac PMH who was in normal state of healthy until three days ago when he started noticing worsening SOB.  Developed into fever of 101.4 but no cough or sputum production.  In ED was noted to be tachycardic and hypotensive that resolved slowly with fluid.  PCCM called since patient's BP was marginal.  SIGNIFICANT EVENTS / STUDIES:  8/13 admission to the hospital for ?PNA and septic shock.  LINES / TUBES: PIV  CULTURES: Blood 8/13>>> Urine 8/13>>> Sputum 8/13>>>  ANTIBIOTICS: Vancomycin 8/13>>> Rocephin 8/13>>> Zithromax 8/13>>>  PAST MEDICAL HISTORY :  Past Medical History  Diagnosis Date  . CAD (coronary artery disease)     a. Approx. 2000 - MI. Cath showed single vessel disease, PTCA dLAD/medical management. ;  b. NSTEMI 11/13 => LHC: prox and mid LAD 30%, dLAD 60%, pCFX 30%, inf branch of OM 40%, mRCA 50-60%, EF 55-60%; IVUS attempted for RCA but not successful; anatomy felt stable from 2000 => med Rx.  . Diverticulitis   . Hypertension   . Obesity   . CVA (cerebral infarction)     a. Approx. 2002  . Cholelithiasis   . Hypercholesterolemia   . ED (erectile dysfunction)   . OSA on CPAP   . Type II diabetes mellitus   . History of stomach ulcers   . History of MRSA infection     "little toe right foot" (10/26/2012)  . Tunnel vision     "both eyes since stroke"  . Claustrophobia   . B12 deficiency   . Diabetic neuropathy   . Atrial fibrillation     a. confirmed by event monitor   Past Surgical History  Procedure Laterality Date  . Toe amputation  2006; 2009    "Dr. Blenda Mounts; big toe left foot; little toe on right foot" (10/26/2012)  . Cardiac catheterization  1990's  . Cerebral angiogram  ~ 2000    Prior to Admission medications   Medication Sig Start Date End Date Taking? Authorizing Provider  acetaminophen (TYLENOL) 500 MG tablet Take 1,000 mg by mouth at bedtime.   Yes Historical Provider, MD  aspirin 81 MG tablet Take 81 mg by mouth daily.     Yes Historical Provider, MD  Cholecalciferol (EQL VITAMIN D3) 1000 UNITS tablet Take 1,000 Units by mouth daily.     Yes Historical Provider, MD  clopidogrel (PLAVIX) 75 MG tablet Take 1 tablet (75 mg total) by mouth daily. 02/13/14  Yes Aleksei Plotnikov V, MD  Dextromethorphan HBr (VICKS DAYQUIL COUGH) 15 MG/15ML LIQD Take 15 mLs by mouth daily as needed (cough).   Yes Historical Provider, MD  furosemide (LASIX) 80 MG tablet Take 80 mg by mouth once a week.   Yes Historical Provider, MD  gabapentin (NEURONTIN) 300 MG capsule Take 300 mg by mouth 2 (two) times daily.   Yes Historical Provider, MD  glimepiride (AMARYL) 4 MG tablet Take 1 tablet (4 mg total) by mouth 2 (two) times daily. 05/15/14  Yes Aleksei Plotnikov V, MD  guaiFENesin (MUCINEX) 600 MG 12 hr tablet Take 600 mg by mouth 2 (two) times daily.   Yes Historical Provider, MD  ibuprofen (ADVIL,MOTRIN) 200 MG tablet Take 400 mg by mouth every 8 (eight) hours as needed for  fever.   Yes Historical Provider, MD  insulin lispro (HUMALOG KWIKPEN) 100 UNIT/ML KiwkPen Inject 5-20 Units into the skin 4 (four) times daily - after meals and at bedtime. Per sliding scale   Yes Historical Provider, MD  losartan (COZAAR) 100 MG tablet Take 1 tablet (100 mg total) by mouth daily. 05/15/14 05/15/15 Yes Aleksei Plotnikov V, MD  metoprolol (LOPRESSOR) 50 MG tablet Take 1 tablet (50 mg total) by mouth 2 (two) times daily. 05/15/14  Yes Aleksei Plotnikov V, MD  pioglitazone (ACTOS) 45 MG tablet Take 1 tablet (45 mg total) by mouth daily. 05/15/14  Yes Aleksei Plotnikov V, MD  pravastatin (PRAVACHOL) 40 MG tablet Take 1 tablet (40 mg total) by mouth daily. 05/15/14  Yes Aleksei Plotnikov V, MD   Pseudoeph-Doxylamine-DM-APAP (NYQUIL PO) Take 15 mLs by mouth daily as needed (cough).   Yes Historical Provider, MD  triamcinolone cream (KENALOG) 0.5 % Apply 1 application topically 2 (two) times daily as needed (rash).   Yes Historical Provider, MD  vitamin B-12 (CYANOCOBALAMIN) 1000 MCG tablet Take 1,000 mcg by mouth daily.     Yes Historical Provider, MD  nitroGLYCERIN (NITROSTAT) 0.4 MG SL tablet Place 0.4 mg under the tongue every 5 (five) minutes as needed. For chest pain 06/07/11   Cassandria Anger, MD   Allergies  Allergen Reactions  . Xarelto [Rivaroxaban]     rash    FAMILY HISTORY:  Family History  Problem Relation Age of Onset  . Asthma Neg Hx   . Cancer Mother 64    breast  . Heart disease Father 45    heart   SOCIAL HISTORY:  reports that he has quit smoking. His smoking use included Cigarettes and Cigars. He has a 30 pack-year smoking history. His smokeless tobacco use includes Snuff. He reports that he drinks alcohol. He reports that he does not use illicit drugs.  REVIEW OF SYSTEMS:   12 point ROS is negative other than above.  SUBJECTIVE:   VITAL SIGNS: Temp:  [98.5 F (36.9 C)-100.9 F (38.3 C)] 100.9 F (38.3 C) (08/13 1449) Pulse Rate:  [92-110] 101 (08/13 1600) Resp:  [17-26] 26 (08/13 1600) BP: (86-140)/(43-95) 91/43 mmHg (08/13 1600) SpO2:  [94 %-99 %] 96 % (08/13 1600) Weight:  [350 lb (158.759 kg)] 350 lb (158.759 kg) (08/13 1200)  PHYSICAL EXAMINATION: General:  Chronically ill appearing morbidly obese male, NAD. Neuro:  Awake and alert, very interactive, following all commands. HEENT:  [place]/AT, PERRL, EOM-I and DMM.  Very poor dentition. Cardiovascular:  Distant, RRR, Nl S1/S2, -M/R/G. Lungs:  Distant, bibasilar crackles. Abdomen:  Obese, soft, NT, ND and +BS. Musculoskeletal:  1+ pedal edema bilaterally. Skin:  WNL  Recent Labs Lab 07/25/14 1204  NA 141  K 3.9  CL 101  CO2 27  BUN 19  CREATININE 0.83  GLUCOSE 114*   Recent  Labs Lab 07/25/14 1204  HGB 11.6*  HCT 36.8*  WBC 9.5  PLT 264   Dg Chest Port 1 View  07/25/2014   CLINICAL DATA:  Respiratory distress  EXAM: PORTABLE CHEST - 1 VIEW  COMPARISON:  05/25/2014  FINDINGS: There are bilateral interstitial and alveolar airspace opacities most severe in the right lower lung. There is prominence of the central pulmonary vasculature. There is no pneumothorax. There is no definite pleural effusion. The heart and mediastinum are stable. There is thoracic aortic atherosclerosis.  The osseous structures are unremarkable.  IMPRESSION: Bilateral interstitial and alveolar airspace opacities, right greater than left, which  may reflect pulmonary edema versus multilobar pneumonia.   Electronically Signed   By: Kathreen Devoid   On: 07/25/2014 12:35   ASSESSMENT / PLAN:  73 year old male with extensive cardiac history, CXR is concerning for PNA even though patient' is completely asymptomatic except for SOB.  Patient's BP is being measured on his right arm with his right arm elevated, no evidence of hypoperfusion (renal function WNL) but lactic acid is 2.57.  Difficult to refer to this as septic shock at this point.  Plan: - Admit to SDU. - Repeat lactic acid at 6 PM, if not resolving then will reconsider transfer to the ICU and placement of a central line for pressors but doubtful at this point. - No TLC placement. - No admission to the ICU. - Agree with abx choices above. - F/U on cultures. - As long as patient is asymptomatic recommend no further IVF given cardiac history. - Accurate measure of BP would be of great help in this case to avoid more fluid and respiratory failure from pulmonary edema. - CPAP while asleep. - Pulmonary hygienes. - PCCM will sign off, please call back if needed.  Rush Farmer, M.D. [facility] Pulmonary/Critical Care Medicine. Pager: [phone]. After hours pager: [phone].  07/25/2014, 4:17 PM

## 2014-07-25 NOTE — ED Notes (Signed)
Admitting MD at bedside.

## 2014-07-25 NOTE — ED Notes (Signed)
Dr. Nelson at bedside

## 2014-07-25 NOTE — Progress Notes (Signed)
Return telephone call from Dr. Hilbert Bible. Informed of patient CBG 358 and that RN advised patient of insulin medication coverage of 5 units at bedtime per order. Patient stated, "that he wants 14 units of coverage because that is what he covers himself at home." Advised that as a nurse I am only allowed to give ordered coverage and that it was not in my scope of practice to write or change doctors orders. Patient then turned to son and stated... "Bring my insulin pen from home tomorrow and I will regulate my own insulin because they don't know what they are doing." Son agreed to bring in for father. Education provided about bringing home medications in for patient was highly advised against for the safety of patient.   MD stated that no changes are to be made to current medication orders to increase insulin dosage as patient request. Advise patient to talk over medication orders per hospitalization verses home dosages and orders.   Will continue monitor patient status. Leeann Must

## 2014-07-25 NOTE — Progress Notes (Addendum)
Unit CM UR Completed by [facility] ED CM  W. Dejion Grillo RN  

## 2014-07-25 NOTE — H&P (Addendum)
[facility] History and Physical  Charles [name] ZOX:[id number] DOB: [birth year] DOA: 07/25/2014   PCP: [name], MD  Specialists: Dr. Dorris Carnes is is cardiologist  Chief Complaint: Shortness of breath since Sunday  HPI: Charles [name] is a 73 y.o. male with a past medical history of paroxysmal atrial fibrillation, obstructive sleep apnea, history of stroke, history of coronary artery disease on medical management, diabetes mellitus type II, with neuropathy, and retinopathy, hypertension, who was in his usual state of health of this past Sunday when he started developing minimal shortness of breath. He tells me that he had "cold like symptoms". Over the next 2-3 days his symptoms started getting worse. He did not have any cough, to begin with. He started coughing yesterday and today. He has had a dry cough. Over the last few days he also had a fever up to 101.11F. And, then this morning at about 3:00 he woke up to go to the bathroom and got quite short of breath, even with minimal exertion. He uses a CPAP every night and despite the use of CPAP he did not feel any better. His wife gave him her oxygen without any relief. She gave him one of his inhalers without any relief. Since his symptoms were not improving they called his primary care physician, who recommended that they call 911. When EMS arrived at the house he was found to be in respiratory distress. He was found to be cold, clammy, diaphoretic. Saturations initially were in 72%. He was placed on a nonrebreather and subsequently, on BiPAP. Was given Nebulizer treatments, steroids, and, he was brought into the emergency department. Patient reports feeling better since he has been in the ED. In the emergency department it was noted that his blood pressure had been dropping. He was also tachycardic. Patient denies any dizziness at this time. Denies any chest pain, per se. He, says whenever he coughs he experiences some discomfort in his  chest, but none otherwise.  Home Medications: Prior to Admission medications   Medication Sig Start Date End Date Taking? Authorizing Provider  acetaminophen (TYLENOL) 500 MG tablet Take 1,000 mg by mouth at bedtime.   Yes Historical Provider, MD  aspirin 81 MG tablet Take 81 mg by mouth daily.     Yes Historical Provider, MD  Cholecalciferol (EQL VITAMIN D3) 1000 UNITS tablet Take 1,000 Units by mouth daily.     Yes Historical Provider, MD  clopidogrel (PLAVIX) 75 MG tablet Take 1 tablet (75 mg total) by mouth daily. 02/13/14  Yes Aleksei Plotnikov V, MD  Dextromethorphan HBr (VICKS DAYQUIL COUGH) 15 MG/15ML LIQD Take 15 mLs by mouth daily as needed (cough).   Yes Historical Provider, MD  furosemide (LASIX) 80 MG tablet Take 80 mg by mouth once a week.   Yes Historical Provider, MD  gabapentin (NEURONTIN) 300 MG capsule Take 300 mg by mouth 2 (two) times daily.   Yes Historical Provider, MD  glimepiride (AMARYL) 4 MG tablet Take 1 tablet (4 mg total) by mouth 2 (two) times daily. 05/15/14  Yes Aleksei Plotnikov V, MD  guaiFENesin (MUCINEX) 600 MG 12 hr tablet Take 600 mg by mouth 2 (two) times daily.   Yes Historical Provider, MD  ibuprofen (ADVIL,MOTRIN) 200 MG tablet Take 400 mg by mouth every 8 (eight) hours as needed for fever.   Yes Historical Provider, MD  insulin lispro (HUMALOG KWIKPEN) 100 UNIT/ML KiwkPen Inject 5-20 Units into the skin 4 (four) times daily - after meals and at  bedtime. Per sliding scale   Yes Historical Provider, MD  losartan (COZAAR) 100 MG tablet Take 1 tablet (100 mg total) by mouth daily. 05/15/14 05/15/15 Yes Aleksei Plotnikov V, MD  metoprolol (LOPRESSOR) 50 MG tablet Take 1 tablet (50 mg total) by mouth 2 (two) times daily. 05/15/14  Yes Aleksei Plotnikov V, MD  pioglitazone (ACTOS) 45 MG tablet Take 1 tablet (45 mg total) by mouth daily. 05/15/14  Yes Aleksei Plotnikov V, MD  pravastatin (PRAVACHOL) 40 MG tablet Take 1 tablet (40 mg total) by mouth daily. 05/15/14  Yes  Aleksei Plotnikov V, MD  Pseudoeph-Doxylamine-DM-APAP (NYQUIL PO) Take 15 mLs by mouth daily as needed (cough).   Yes Historical Provider, MD  triamcinolone cream (KENALOG) 0.5 % Apply 1 application topically 2 (two) times daily as needed (rash).   Yes Historical Provider, MD  vitamin B-12 (CYANOCOBALAMIN) 1000 MCG tablet Take 1,000 mcg by mouth daily.     Yes Historical Provider, MD  nitroGLYCERIN (NITROSTAT) 0.4 MG SL tablet Place 0.4 mg under the tongue every 5 (five) minutes as needed. For chest pain 06/07/11   Cassandria Anger, MD    Allergies:  Allergies  Allergen Reactions  . Xarelto [Rivaroxaban]     rash    Past Medical History: Past Medical History  Diagnosis Date  . CAD (coronary artery disease)     a. Approx. 2000 - MI. Cath showed single vessel disease, PTCA dLAD/medical management. ;  b. NSTEMI 11/13 => LHC: prox and mid LAD 30%, dLAD 60%, pCFX 30%, inf branch of OM 40%, mRCA 50-60%, EF 55-60%; IVUS attempted for RCA but not successful; anatomy felt stable from 2000 => med Rx.  . Diverticulitis   . Hypertension   . Obesity   . CVA (cerebral infarction)     a. Approx. 2002  . Cholelithiasis   . Hypercholesterolemia   . ED (erectile dysfunction)   . OSA on CPAP   . Type II diabetes mellitus   . History of stomach ulcers   . History of MRSA infection     "little toe right foot" (10/26/2012)  . Tunnel vision     "both eyes since stroke"  . Claustrophobia   . B12 deficiency   . Diabetic neuropathy   . Atrial fibrillation     a. confirmed by event monitor    Past Surgical History  Procedure Laterality Date  . Toe amputation  2006; 2009    "Dr. Blenda Mounts; big toe left foot; little toe on right foot" (10/26/2012)  . Cardiac catheterization  1990's  . Cerebral angiogram  ~ 2000    Social History: Patient lives in [place] with his wife. Quit smoking about 20 years ago. Has about a 30-pack-year history of smoking. Occasional alcohol use. Usually  independent with daily activities.  Family History:  Family History  Problem Relation Age of Onset  . Asthma Neg Hx   . Cancer Mother 32    breast  . Heart disease Father 58    heart     Review of Systems - History obtained from the patient General ROS: positive for  - chills, fatigue and fever Psychological ROS: negative Ophthalmic ROS: chronic visual impairment ENT ROS: negative Allergy and Immunology ROS: negative Hematological and Lymphatic ROS: negative Endocrine ROS: negative Respiratory ROS: as in hpi Cardiovascular ROS: as in hpi Gastrointestinal ROS: no abdominal pain, change in bowel habits, or black or bloody stools Genito-Urinary ROS: no dysuria, trouble voiding, or hematuria Musculoskeletal ROS: negative Neurological  ROS: no TIA or stroke symptoms Dermatological ROS: negative  Physical Examination  Filed Vitals:   07/25/14 1449 07/25/14 1517 07/25/14 1545 07/25/14 1600  BP: '87/46 88/50 88/46 ' 91/43  Pulse: 110  99 101  Temp: 100.9 F (38.3 C)     TempSrc: Rectal     Resp:   26 26  Height:      Weight:      SpO2: 94%  97% 96%    BP 91/43  Pulse 101  Temp(Src) 100.9 F (38.3 C) (Rectal)  Resp 26  Ht '6\' 4"'  (1.93 m)  Wt 158.759 kg (350 lb)  BMI 42.62 kg/m2  SpO2 96%  General appearance: alert, cooperative, appears stated age, no distress and morbidly obese Head: Normocephalic, without obvious abnormality, atraumatic Eyes: conjunctivae/corneas clear. PERRL, EOM's intact.  Throat: dry mm Neck: no adenopathy, no carotid bruit, no JVD, supple, symmetrical, trachea midline and thyroid not enlarged, symmetric, no tenderness/mass/nodules Resp: He has end expiratory wheezing. At the bases with crackles bilaterally. Few rhonchi heard over the right lower lung field. Cardio: S1-S2 is tachycardic. Regular. No S3, S4. No rubs, murmurs, bruit. Minimal pedal edema bilaterally.  GI: soft, non-tender; bowel sounds normal; no masses,  no organomegaly Extremities:  edema minimal Pulses: 2+ and symmetric Skin: Skin color, texture, turgor normal. No rashes or lesions Lymph nodes: Cervical, supraclavicular, and axillary nodes normal. Neurologic: He is alert and oriented x3. Hard of hearing. No focal neurological deficits appreciated.  Laboratory Data: Results for orders placed during the hospital encounter of 07/25/14 (from the past 48 hour(s))  CBC WITH DIFFERENTIAL     Status: Abnormal   Collection Time    07/25/14 12:04 PM      Result Value Ref Range   WBC 9.5  4.0 - 10.5 K/uL   RBC 3.79 (*) 4.22 - 5.81 MIL/uL   Hemoglobin 11.6 (*) 13.0 - 17.0 g/dL   HCT 36.8 (*) 39.0 - 52.0 %   MCV 97.1  78.0 - 100.0 fL   MCH 30.6  26.0 - 34.0 pg   MCHC 31.5  30.0 - 36.0 g/dL   RDW 16.0 (*) 11.5 - 15.5 %   Platelets 264  150 - 400 K/uL   Neutrophils Relative % 87 (*) 43 - 77 %   Neutro Abs 8.3 (*) 1.7 - 7.7 K/uL   Lymphocytes Relative 9 (*) 12 - 46 %   Lymphs Abs 0.9  0.7 - 4.0 K/uL   Monocytes Relative 4  3 - 12 %   Monocytes Absolute 0.4  0.1 - 1.0 K/uL   Eosinophils Relative 0  0 - 5 %   Eosinophils Absolute 0.0  0.0 - 0.7 K/uL   Basophils Relative 0  0 - 1 %   Basophils Absolute 0.0  0.0 - 0.1 K/uL  BASIC METABOLIC PANEL     Status: Abnormal   Collection Time    07/25/14 12:04 PM      Result Value Ref Range   Sodium 141  137 - 147 mEq/L   Potassium 3.9  3.7 - 5.3 mEq/L   Chloride 101  96 - 112 mEq/L   CO2 27  19 - 32 mEq/L   Glucose, Bld 114 (*) 70 - 99 mg/dL   BUN 19  6 - 23 mg/dL   Creatinine, Ser 0.83  0.50 - 1.35 mg/dL   Calcium 8.5  8.4 - 10.5 mg/dL   GFR calc non Af Amer 86 (*) >90 mL/min   GFR calc Af Amer >  90  >90 mL/min   Comment: (NOTE)     The eGFR has been calculated using the CKD EPI equation.     This calculation has not been validated in all clinical situations.     eGFR's persistently <90 mL/min signify possible Chronic Kidney     Disease.   Anion gap 13  5 - 15  PRO B NATRIURETIC PEPTIDE     Status: Abnormal   Collection  Time    07/25/14 12:09 PM      Result Value Ref Range   Pro B Natriuretic peptide (BNP) 1647.0 (*) 0 - 125 pg/mL  I-STAT TROPOININ, ED     Status: None   Collection Time    07/25/14 12:19 PM      Result Value Ref Range   Troponin i, poc 0.04  0.00 - 0.08 ng/mL   Comment 3            Comment: Due to the release kinetics of cTnI,     a negative result within the first hours     of the onset of symptoms does not rule out     myocardial infarction with certainty.     If myocardial infarction is still suspected,     repeat the test at appropriate intervals.  I-STAT ARTERIAL BLOOD GAS, ED     Status: Abnormal   Collection Time    07/25/14 12:55 PM      Result Value Ref Range   pH, Arterial 7.443  7.350 - 7.450   pCO2 arterial 34.7 (*) 35.0 - 45.0 mmHg   pO2, Arterial 66.0 (*) 80.0 - 100.0 mmHg   Bicarbonate 23.7  20.0 - 24.0 mEq/L   TCO2 25  0 - 100 mmol/L   O2 Saturation 94.0     Collection site RADIAL, ALLEN'S TEST ACCEPTABLE     Drawn by RT     Sample type ARTERIAL    I-STAT CG4 LACTIC ACID, ED     Status: Abnormal   Collection Time    07/25/14  2:00 PM      Result Value Ref Range   Lactic Acid, Venous 2.57 (*) 0.5 - 2.2 mmol/L    Radiology Reports: Dg Chest Port 1 View  07/25/2014   CLINICAL DATA:  Respiratory distress  EXAM: PORTABLE CHEST - 1 VIEW  COMPARISON:  05/25/2014  FINDINGS: There are bilateral interstitial and alveolar airspace opacities most severe in the right lower lung. There is prominence of the central pulmonary vasculature. There is no pneumothorax. There is no definite pleural effusion. The heart and mediastinum are stable. There is thoracic aortic atherosclerosis.  The osseous structures are unremarkable.  IMPRESSION: Bilateral interstitial and alveolar airspace opacities, right greater than left, which may reflect pulmonary edema versus multilobar pneumonia.   Electronically Signed   By: Kathreen Devoid   On: 07/25/2014 12:35    Electrocardiogram: Sinus  tachycardia at 99 beats per minute. PACs are noted. Intervals are normal. There is ST segment abnormalities in the inferior leads and possible elevations, but these are old compared to previous EKG. No concerning T-wave changes. Q waves also minimally noted in the inferior leads.  Problem List  Principal Problem:   CAP (community acquired pneumonia) Active Problems:   VISUAL IMPAIRMENT   MYOCARDIAL INFARCTION, HX OF   CORONARY ARTERY DISEASE   Diabetic peripheral neuropathy associated with type 2 diabetes mellitus   Type 2 diabetes, controlled, with retinopathy   Atrial fibrillation   Acute respiratory failure  Septic shock   Assessment: This is a 73 year old, Caucasian male, who presents with the 4-5 day history of worsening shortness of breath, fever, cough and was noted to be in acute respiratory failure when he arrived. He has chest x-ray findings suspicious for pneumonia. His blood pressure is low and he is tachycardic. Patient is probably in early septic shock. Lactic acid is elevated. WBC is surprisingly normal.  Plan: #1 community-acquired pneumonia with acute respiratory failure and early septic shock: Currently fluid resuscitation is in progress. If his blood pressures do not improve after 3 L he might require pressors. I have discussed with pulmonary and critical care medicine, Dr. Halford Chessman, and they will consult on this patient. Patient for now will be admitted to step down unit. We will check pro-calcitonin. He will be placed on ceftriaxone, azithromycin, and will add vancomycin due to the severity of his illness. His respiratory status seems to have improved after he is received nebulizer treatments. He is currently only on oxygen by nasal cannula, which will be continued. He'll also be given nebulizer treatments. Hold off on steroids for now.  #2 history of type 2 diabetes with diabetic neuropathy, and retinopathy: Place him on sliding scale coverage. Hold his oral agents. Hold his  home insulin regimen for now. Check HbA1c.  #3 history of paroxysmal atrial fibrillation: He's not on anticoagulation as he has not tolerated certain anticoagulants in the past and has refused warfarin. Continue with aspirin and Plavix. Monitor him on telemetry. Hold his beta blocker due to his hypotension. This should be reinitiated as soon as possible.  #4 history of coronary artery disease: He has a 2 vessel disease based on cardiac catheterization in 2013, but amenable only to medical management. EF at that time was greater than 55%. There was a concern earlier that he was in acute congestive heart failure. However it turns out that he has an infectious process. Echocardiogram will be ordered to look at his cardiac function. Hold his other cardiac medications due to their blood pressure lowering tendencies.  #5 history of obstructive sleep apnea: Continue with CPAP at nighttime.  DVT Prophylaxis: Lovenox Code Status: Full code Family Communication: Discussed with the patient, his wife and his daughter  Disposition Plan: Admit to step down for now   Further management decisions will depend on results of further testing and patient's response to treatment.   [facility]  [facility] Pager [phone]  If 7PM-7AM, please contact night-coverage [email or web address] Password [facility]  07/25/2014, 4:08 PM  Disclaimer: This note was dictated with voice recognition software. Similar sounding words can inadvertently be transcribed and may not be corrected upon review.

## 2014-07-25 NOTE — Progress Notes (Signed)
Nurse advised patient and son that MD stated, "that no changes are to be made to current medication orders to increase insulin dosage as patient request. Advise patient to talk over medication orders per hospitalization verses home dosages and orders.  Patient then turned to son again and stated... "Bring my insulin pen from home tomorrow and I will regulate my own insulin because they don't know what they are doing." Son agreed to bring in for father. Education provided once again about bringing home medications in for patient was highly advised against for the safety of patient.  Will continue to monitor patient status. NakeiaRN .

## 2014-07-25 NOTE — Consult Note (Signed)
Patient ID: KLYDE BANKA MRN: [id number], DOB/AGE: 08/21/1941   Admit date: 07/25/2014   Primary Physician: [name], MD Primary Cardiologist: Dr. Harrington Challenger  Pt. Profile:  Charles [name] is a 73 y.o. male with a history of CAD (MI in 2000), atrial fibrillation, DM with peripheral neuropathy, OSA on CPAP, HTN, CVA (2002) and obesity who was brought to [facility] today  via EMS for respiratory distress.   Per EMS: The patient was found cool, clammy, diaphoretic with diminished lung sounds with fluid and dependent pitting edema. Initial sats 72% on NRB, 88% on CPAP. He reported a non productive cough since Tuesday morning and fevers (tmax 101.4), diaphoresis, chills, and small amount sinus congestion/drainage. Wife is a Marine scientist and reports he has been having orthopnea and PND and slight LE edema. He denies recent chest pain or syncope. He has never had anything like this before and denies a history of CHF.      Problem List  Past Medical History  Diagnosis Date  . CAD (coronary artery disease)     a. Approx. 2000 - MI. Cath showed single vessel disease, PTCA dLAD/medical management. ;  b. NSTEMI 11/13 => LHC: prox and mid LAD 30%, dLAD 60%, pCFX 30%, inf branch of OM 40%, mRCA 50-60%, EF 55-60%; IVUS attempted for RCA but not successful; anatomy felt stable from 2000 => med Rx.  . Diverticulitis   . Hypertension   . Obesity   . CVA (cerebral infarction)     a. Approx. 2002  . Cholelithiasis   . Hypercholesterolemia   . ED (erectile dysfunction)   . OSA on CPAP   . Type II diabetes mellitus   . History of stomach ulcers     "issues a long long time ago" (10/26/2012)  . History of MRSA infection     "little toe right foot" (10/26/2012)  . Tunnel vision     "both eyes since stroke"  . 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 Claustrophobia   . B12 deficiency   . Diabetic neuropathy     Past Surgical History  Procedure Laterality  Date  . Toe amputation  2006; 2009    "Dr. Blenda Mounts; big toe left foot; little toe on right foot" (10/26/2012)  . Cardiac catheterization  1990's  . Cerebral angiogram  ~ 2000     Allergies  Allergies  Allergen Reactions  . Xarelto [Rivaroxaban]     rash     Home Medications  Prior to Admission medications   Medication Sig Start Date End Date Taking? Authorizing Provider  acetaminophen (TYLENOL) 500 MG tablet Take 1,000 mg by mouth at bedtime.   Yes Historical Provider, MD  aspirin 81 MG tablet Take 81 mg by mouth daily.     Yes Historical Provider, MD  Cholecalciferol (EQL VITAMIN D3) 1000 UNITS tablet Take 1,000 Units by mouth daily.     Yes Historical Provider, MD  clopidogrel (PLAVIX) 75 MG tablet Take 1 tablet (75 mg total) by mouth daily. 02/13/14  Yes Aleksei Plotnikov V, MD  Dextromethorphan HBr (VICKS DAYQUIL COUGH) 15 MG/15ML LIQD Take 15 mLs by mouth daily as needed (cough).   Yes Historical Provider, MD  furosemide (LASIX) 80 MG tablet Take 80 mg by mouth once a week.   Yes Historical Provider, MD  gabapentin (NEURONTIN) 300 MG capsule Take 300 mg by mouth 2 (two) times daily.   Yes Historical Provider, MD  glimepiride (AMARYL) 4 MG tablet Take 1 tablet (4 mg total) by mouth 2 (two) times daily. 05/15/14  Yes Aleksei Plotnikov V, MD  guaiFENesin (MUCINEX) 600 MG 12 hr tablet Take 600 mg by mouth 2 (two) times daily.   Yes Historical Provider, MD  ibuprofen (ADVIL,MOTRIN) 200 MG tablet Take 400 mg by mouth every 8 (eight) hours as needed for fever.   Yes Historical Provider, MD  insulin lispro (HUMALOG KWIKPEN) 100 UNIT/ML KiwkPen Inject 5-20 Units into the skin 4 (four) times daily - after meals and at bedtime. Per sliding scale   Yes Historical Provider, MD  losartan (COZAAR) 100 MG tablet Take 1 tablet (100 mg total) by mouth daily. 05/15/14 05/15/15 Yes Aleksei Plotnikov V, MD  metoprolol (LOPRESSOR) 50 MG tablet Take 1 tablet (50 mg total) by mouth 2 (two) times daily. 05/15/14   Yes Aleksei Plotnikov V, MD  pioglitazone (ACTOS) 45 MG tablet Take 1 tablet (45 mg total) by mouth daily. 05/15/14  Yes Aleksei Plotnikov V, MD  pravastatin (PRAVACHOL) 40 MG tablet Take 1 tablet (40 mg total) by mouth daily. 05/15/14  Yes Aleksei Plotnikov V, MD  Pseudoeph-Doxylamine-DM-APAP (NYQUIL PO) Take 15 mLs by mouth daily as needed (cough).   Yes Historical Provider, MD  triamcinolone cream (KENALOG) 0.5 % Apply 1 application topically 2 (two) times daily as needed (rash).   Yes Historical Provider, MD  vitamin B-12 (CYANOCOBALAMIN) 1000 MCG tablet Take 1,000 mcg by mouth daily.     Yes Historical Provider, MD  nitroGLYCERIN (NITROSTAT) 0.4 MG SL tablet Place 0.4 mg under the tongue every 5 (five) minutes as needed. For chest pain 06/07/11   Cassandria Anger, MD    Family History  Family History  Problem Relation Age of Onset  . Asthma Neg Hx   . Cancer Mother 7    breast  . Heart disease Father 26    heart   Family Status  Relation Status Death Age  . Mother Deceased   . Father Deceased      Social History  History   Social History  . Marital Status: Married    Spouse Name: N/A    Number of Children: N/A  . Years of Education: N/A   Occupational History  . Not on file.   Social History Main Topics  . Smoking status: Former Smoker -- 1.00 packs/day for 30 years    Types: Cigarettes, Cigars  . Smokeless tobacco: Current User    Types: Snuff     Comment: 10/26/2012 "quit smoking cigarettes  in ~ 1996; wife smokes in the house"  . Alcohol Use: Yes     Comment: 10/26/2012 "have a drink maybe twice/yr"  . Drug Use: No  . Sexual Activity: Yes   Other Topics Concern  . Not on file   Social History Narrative   Disabled s/p CVA     Review of Systems General:  ++chills, fever, night sweats. No weight changes.  Cardiovascular:  ++ chest pain, dyspnea on exertion, edema, orthopnea, palpitations, paroxysmal nocturnal dyspnea. Dermatological: No rash,  lesions/masses Respiratory: ++cough, dyspnea Urologic: No hematuria, dysuria Abdominal:   No nausea, vomiting, diarrhea, bright red blood per rectum, melena, or hematemesis Neurologic:  No visual changes, wkns, changes in mental status. All other systems reviewed and are otherwise negative except as noted above.  Physical Exam  Blood pressure 140/95, pulse 92, temperature 98.5 F (36.9 C), temperature source Oral, resp. rate 22, height 6\' 4"  (1.93 m), weight 350 lb (158.759  kg), SpO2 99.00%.  General: Pleasant, increased work of breathing. On 02 Psych: Normal affect. Neuro: Alert and oriented X 3. Moves all extremities spontaneously. HEENT: Normal  Neck: Supple without bruits or  +JVD. Lungs:  Resp regular and unlabored, CTA. Heart: tachycardic,  no s3, s4, or murmurs. Abdomen: Soft, non-tender, non-distended, BS + x 4.  Extremities: No clubbing, cyanosis or edema. DP/PT/Radials 2+ and equal bilaterally. Trace edema  Labs  No results found for this basename: CKTOTAL, CKMB, TROPONINI,  in the last 72 hours Lab Results  Component Value Date   WBC 9.5 07/25/2014   HGB 11.6* 07/25/2014   HCT 36.8* 07/25/2014   MCV 97.1 07/25/2014   PLT 264 07/25/2014    Recent Labs Lab 07/25/14 1204  NA 141  K 3.9  CL 101  CO2 27  BUN 19  CREATININE 0.83  CALCIUM 8.5  GLUCOSE 114*   Cath 2013  Left Ventricular Angiogram: LVEF=55-60%  Impression:  1. Double vessel CAD  2. Moderate stenosis RCA, appears to be an eccentric calcified stenosis. This does not appear to be flow limiting. I attempted IVUS to better visualize but due to tortuous anatomy and poor guide support from the radial approach, I was unable to pass the IVUS catheter around the severe bend in the vessel. The reports in the chart from cath in 2000 indicate a 60% stenosis in the RCA suggesting that this is a stable lesion.  3. Chest pain and slight elevation of troponin in the setting of atrial fibrillation  Recommendations: He  will need further evaluation of atrial fibrillation. He is in sinus now. Would medically manage CAD. Aggressive risk factor modification.    Radiology/Studies  Dg Chest Port 1 View  07/25/2014   CLINICAL DATA:  Respiratory distress  EXAM: PORTABLE CHEST - 1 VIEW  COMPARISON:  05/25/2014  FINDINGS: There are bilateral interstitial and alveolar airspace opacities most severe in the right lower lung. There is prominence of the central pulmonary vasculature. There is no pneumothorax. There is no definite pleural effusion. The heart and mediastinum are stable. There is thoracic aortic atherosclerosis.  The osseous structures are unremarkable.  IMPRESSION: Bilateral interstitial and alveolar airspace opacities, right greater than left, which may reflect pulmonary edema versus multilobar pneumonia.    ECG Sinus tachycardia with PACs Low voltage, precordial leads  ASSESSMENT AND PLAN  MIN COLLYMORE is a 73 y.o. male with a history of CAD (MI in 2000), atrial fibrillation, DM with peripheral neuropathy, OSA on CPAP, HTN, CVA (2002) and obesity who was brought to [facility] today via EMS for respiratory distress.   CHF- new onset. EF 55-60% by cath in 10/2012. Pulm edema vs CHF vs mixed on CXR. BNP 1,647 -- Will need 2D ECHO.  -- Does have s/s volume overload but diuresis limited by hypotension.   Possible PNA- fevers, chills and non productive cough x2 days. CXR with possible multilobar PNA. -- Started on Levaquin  -- No white count but does have 87% neutrophils -- WIll have triad admit for this and we will consult  Hypotension- lowest SBP 66. Last 87/46. Hold BP meds for now.   Paroxysmal afib- currently in NSR.  -- Confirmed by monitor in 2013. Was on Xarelto. However, he decided to discontinue Xarelto due to possible rash, cost and lawyers adds on TV ( asked to go back on plavix?)   CAD- no chest pain -- Troponin neg x1  OSA- continue CPAP  Previous CVA- on ASA on Plavix  DM- per  IM.   Tyrell Antonio, PA-C 07/25/2014, 1:47 PM  Pager [phone]   The patient was seen, examined and discussed with Lorretta Harp, PA-C and I agree with the above.   73 year old male with with a history of CAD, NSTEMI in the settings of a-fib with RVR in 2013 with cath, DM with peripheral neuropathy, OSA on CPAP, HTN, CVA (2002) and obesity, followed in the clinic by Dr Dorris Carnes, last seen in 2013 who presented with SOB, chills and fever.  It started on Sunday with cold like symptoms followed by chills, fever and cough that started the last night. He is able to lay flat and denies orthopnea or PND.   The patient is hypotensive on physical exam and has crackles and diminished breathing sounds on his lungs.  His clinical presentation is consistent with multilobular pneumonia with sepsis, however he is also fluid overloaded. His LV systolic and diastolic function is unknown, last LVEF was from cath in 2013.  We would recommend to treat for pneumonia. We will order an echocardiogram. Once BP allow we would consider low doses of Lasix.    Dorothy Spark 07/25/2014

## 2014-07-25 NOTE — Telephone Encounter (Signed)
Patient Information:  Caller Name: Dub Mikes  Phone: [phone]  Patient: Charles [name], Charles [name]  Gender: Male  DOB: [birth year]  Age: 73 Years  PCP: Plotnikov, Alex (Adults only)  Office Follow Up:  Does the office need to follow up with this patient?: No  Instructions For The Office: N/A  RN Note:  Agreed to plan, will hang up and dial 911 now.  Symptoms  Reason For Call & Symptoms: Fever (tmax 101.4), persistent cough with SOB, small amount sinus congestion/drainage, onset of sxs 1-2 days ago and worsening. During triage RN was able to hear pt breathing and coughing and sounded like significant rattle in lung and significant SOB.  Reviewed Health History In EMR: Yes  Reviewed Medications In EMR: Yes  Reviewed Allergies In EMR: Yes  Reviewed Surgeries / Procedures: Yes  Date of Onset of Symptoms: 07/23/2014  Treatments Tried: Ibuprofen; Guafenesin, oxygen (wife uses home O2 and checked his sat on RA was 88 so she gave him some O2 and sat improved to 96)  Treatments Tried Worked: Yes  Guideline(s) Used:  Breathing Difficulty  Disposition Per Guideline:   Call EMS 911 Now  Reason For Disposition Reached:   Severe difficulty breathing (e.g., struggling for each breath, speaks in single words, pulse > 120)  Advice Given:  N/A  Patient Will Follow Care Advice:  YES

## 2014-07-25 NOTE — Progress Notes (Signed)
Patient CBG 358. Advised patient of insulin medication coverage of 5 units at bedtime per order. Patient stated, "that he wants 14 units of coverage because that is what he covers himself at home." Advised that as a nurse I am only allowed to give ordered coverage and that it was not in my scope of practice to write or change doctors orders. Patient then turned to son and stated... "Bring my insulin pen from home tomorrow and I will regulate my own insulin because they don't know what they are doing." Son agreed to bring in for father. Education provided about bringing home medications in for patient was highly advised against for the safety of patient.  Telephone call made to MD to update on the situation and to advise on patients concerns about increasing insulin. Awaiting return phone call. Will continue to monitor patient status. Curlene Dolphin RN

## 2014-07-25 NOTE — Progress Notes (Signed)
ANTIBIOTIC CONSULT NOTE - INITIAL  Pharmacy Consult for vancomycin Indication: rule out pneumonia  Allergies  Allergen Reactions  . Xarelto [Rivaroxaban]     rash    Patient Measurements: Height: 6\' 4"  (193 cm) Weight: 350 lb (158.759 kg) IBW/kg (Calculated) : 86.8 Adjusted Body Weight:   Vital Signs: Temp: 100.9 F (38.3 C) (08/13 1449) Temp src: Rectal (08/13 1449) BP: 91/43 mmHg (08/13 1600) Pulse Rate: 101 (08/13 1600) Intake/Output from previous day:   Intake/Output from this shift: Total I/O In: 3000 [I.V.:3000] Out: -   Labs:  Recent Labs  07/25/14 1204  WBC 9.5  HGB 11.6*  PLT 264  CREATININE 0.83   Estimated Creatinine Clearance: 131.5 ml/min (by C-G formula based on Cr of 0.83). No results found for this basename: VANCOTROUGH, VANCOPEAK, VANCORANDOM, GENTTROUGH, GENTPEAK, GENTRANDOM, TOBRATROUGH, TOBRAPEAK, TOBRARND, AMIKACINPEAK, AMIKACINTROU, AMIKACIN,  in the last 72 hours   Microbiology: No results found for this or any previous visit (from the past 720 hour(s)).  Medical History: Past Medical History  Diagnosis Date  . CAD (coronary artery disease)     a. Approx. 2000 - MI. Cath showed single vessel disease, PTCA dLAD/medical management. ;  b. NSTEMI 11/13 => LHC: prox and mid LAD 30%, dLAD 60%, pCFX 30%, inf branch of OM 40%, mRCA 50-60%, EF 55-60%; IVUS attempted for RCA but not successful; anatomy felt stable from 2000 => med Rx.  . Diverticulitis   . Hypertension   . Obesity   . CVA (cerebral infarction)     a. Approx. 2002  . Cholelithiasis   . Hypercholesterolemia   . ED (erectile dysfunction)   . OSA on CPAP   . Type II diabetes mellitus   . History of stomach ulcers   . History of MRSA infection     "little toe right foot" (10/26/2012)  . Tunnel vision     "both eyes since stroke"  . Claustrophobia   . B12 deficiency   . Diabetic neuropathy   . Atrial fibrillation     a. confirmed by event monitor    Medications:   Anti-infectives   Start     Dose/Rate Route Frequency Ordered Stop   07/26/14 0600  vancomycin (VANCOCIN) 1,500 mg in sodium chloride 0.9 % 500 mL IVPB     1,500 mg 250 mL/hr over 120 Minutes Intravenous Every 12 hours 07/25/14 1617     07/25/14 1630  vancomycin (VANCOCIN) 2,500 mg in sodium chloride 0.9 % 500 mL IVPB     2,500 mg 250 mL/hr over 120 Minutes Intravenous STAT 07/25/14 1616 07/26/14 1630   07/25/14 1430  levofloxacin (LEVAQUIN) IVPB 750 mg     750 mg 100 mL/hr over 90 Minutes Intravenous  Once 07/25/14 1415 07/25/14 1611     Assessment: 76 yom presented to the ED with respiratory distress. To start empiric azithromycin + ceftriaxone + vancomycin for treatment of pneumonia. Pt with Tmax of 100.9 and WBC is WNL. Pt has adequate renal fxn and is obese.   Vanc 8/13>> Azithro 8/13>> CTX 8/13>> Levaquin x 1 8/13  Goal of Therapy:  Vancomycin trough level 15-20 mcg/ml  Plan:  1. Vancomycin 2gm IV x 1 then 1500mg  IV Q12H 2. F/u renal fxn, C&S, clinical status and trough at [facility], Rande Lawman 07/25/2014,4:17 PM

## 2014-07-25 NOTE — ED Provider Notes (Signed)
CSN: [id number]     Arrival date & time 07/25/14  1151 History   First MD Initiated Contact with Patient 07/25/14 1201     Chief Complaint  Patient presents with  . Respiratory Distress   Charles [name] is a 73 year old Caucasian male with PMH of CAD, HTN, CVA, HDL, OSA on CPAP, DM 2 who presents today with a SOB. Patient is not on O2, but his wife does. Due to shortness of breath patient tried his wife's oxygen w/little help. When EMS arrived reportedly satting 88% when they put him on a nonrebreather mask. Felt diaphoretic and complained of shortness of breath. Placed on BPAP and given steroids and duoneb.  Patient has been having cough with no sputum and cold-like symptoms for several days now. He's been using guaifenesin and over-the-counter medications with little help. He had a fever yesterday w/max of 101.8 orally.   He denies CP, fever, chills, N/V, diarrhea, constipation, hematemesis, dysuria, hematuria, sick contacts, or recent travel.   (Consider location/radiation/quality/duration/timing/severity/associated sxs/prior Treatment) Patient is a 73 y.o. male presenting with shortness of breath. The history is provided by the patient and the EMS personnel.  Shortness of Breath Severity:  Severe Onset quality:  Sudden Duration:  2 hours Timing:  Constant Progression:  Worsening Chronicity:  New Context: URI   Relieved by: bpap. Ineffective treatments:  Oxygen Associated symptoms: cough and fever   Associated symptoms: no abdominal pain, no chest pain, no claudication, no diaphoresis, no headaches, no neck pain, no rash, no sore throat, no sputum production, no vomiting and no wheezing     Past Medical History  Diagnosis Date  . CAD (coronary artery disease)     a. Approx. 2000 - MI. Cath showed single vessel disease, PTCA dLAD/medical management. ;  b. NSTEMI 11/13 => LHC: prox and mid LAD 30%, dLAD 60%, pCFX 30%, inf branch of OM 40%, mRCA 50-60%, EF 55-60%; IVUS attempted for  RCA but not successful; anatomy felt stable from 2000 => med Rx.  . Diverticulitis   . Hypertension   . Obesity   . CVA (cerebral infarction)     a. Approx. 2002  . Cholelithiasis   . Hypercholesterolemia   . ED (erectile dysfunction)   . OSA on CPAP   . Type II diabetes mellitus   . History of stomach ulcers   . History of MRSA infection     "little toe right foot" (10/26/2012)  . Tunnel vision     "both eyes since stroke"  . Claustrophobia   . B12 deficiency   . Diabetic neuropathy   . Atrial fibrillation     a. confirmed by event monitor   Past Surgical History  Procedure Laterality Date  . Toe amputation  2006; 2009    "Dr. Blenda Mounts; big toe left foot; little toe on right foot" (10/26/2012)  . Cardiac catheterization  1990's  . Cerebral angiogram  ~ 2000   Family History  Problem Relation Age of Onset  . Asthma Neg Hx   . Cancer Mother 24    breast  . Heart disease Father 60    heart   History  Substance Use Topics  . Smoking status: Former Smoker -- 1.00 packs/day for 30 years    Types: Cigarettes, Cigars  . Smokeless tobacco: Current User    Types: Snuff     Comment: 10/26/2012 "quit smoking cigarettes  in ~ 1996; wife smokes in the house"  . Alcohol Use: Yes     Comment: 10/26/2012 "  have a drink maybe twice/yr"    Review of Systems  Constitutional: Positive for fever. Negative for chills and diaphoresis.  HENT: Negative for sore throat.   Respiratory: Positive for cough and shortness of breath. Negative for sputum production and wheezing.   Cardiovascular: Positive for leg swelling (mild). Negative for chest pain, palpitations and claudication.  Gastrointestinal: Negative for nausea, vomiting, abdominal pain, diarrhea, constipation and abdominal distention.  Genitourinary: Negative for dysuria, frequency, flank pain and decreased urine volume.  Musculoskeletal: Negative for neck pain.  Skin: Negative for rash.  Neurological: Negative for dizziness, speech  difficulty, light-headedness and headaches.  All other systems reviewed and are negative.     Allergies  Xarelto  Home Medications   Prior to Admission medications   Medication Sig Start Date End Date Taking? Authorizing Provider  acetaminophen (TYLENOL) 500 MG tablet Take 1,000 mg by mouth at bedtime.   Yes Historical Provider, MD  aspirin 81 MG tablet Take 81 mg by mouth daily.     Yes Historical Provider, MD  Cholecalciferol (EQL VITAMIN D3) 1000 UNITS tablet Take 1,000 Units by mouth daily.     Yes Historical Provider, MD  clopidogrel (PLAVIX) 75 MG tablet Take 1 tablet (75 mg total) by mouth daily. 02/13/14  Yes Aleksei Plotnikov V, MD  Dextromethorphan HBr (VICKS DAYQUIL COUGH) 15 MG/15ML LIQD Take 15 mLs by mouth daily as needed (cough).   Yes Historical Provider, MD  furosemide (LASIX) 80 MG tablet Take 80 mg by mouth once a week.   Yes Historical Provider, MD  gabapentin (NEURONTIN) 300 MG capsule Take 300 mg by mouth 2 (two) times daily.   Yes Historical Provider, MD  glimepiride (AMARYL) 4 MG tablet Take 1 tablet (4 mg total) by mouth 2 (two) times daily. 05/15/14  Yes Aleksei Plotnikov V, MD  guaiFENesin (MUCINEX) 600 MG 12 hr tablet Take 600 mg by mouth 2 (two) times daily.   Yes Historical Provider, MD  ibuprofen (ADVIL,MOTRIN) 200 MG tablet Take 400 mg by mouth every 8 (eight) hours as needed for fever.   Yes Historical Provider, MD  insulin lispro (HUMALOG KWIKPEN) 100 UNIT/ML KiwkPen Inject 5-20 Units into the skin 4 (four) times daily - after meals and at bedtime. Per sliding scale   Yes Historical Provider, MD  losartan (COZAAR) 100 MG tablet Take 1 tablet (100 mg total) by mouth daily. 05/15/14 05/15/15 Yes Aleksei Plotnikov V, MD  metoprolol (LOPRESSOR) 50 MG tablet Take 1 tablet (50 mg total) by mouth 2 (two) times daily. 05/15/14  Yes Aleksei Plotnikov V, MD  pioglitazone (ACTOS) 45 MG tablet Take 1 tablet (45 mg total) by mouth daily. 05/15/14  Yes Aleksei Plotnikov V, MD   pravastatin (PRAVACHOL) 40 MG tablet Take 1 tablet (40 mg total) by mouth daily. 05/15/14  Yes Aleksei Plotnikov V, MD  Pseudoeph-Doxylamine-DM-APAP (NYQUIL PO) Take 15 mLs by mouth daily as needed (cough).   Yes Historical Provider, MD  triamcinolone cream (KENALOG) 0.5 % Apply 1 application topically 2 (two) times daily as needed (rash).   Yes Historical Provider, MD  vitamin B-12 (CYANOCOBALAMIN) 1000 MCG tablet Take 1,000 mcg by mouth daily.     Yes Historical Provider, MD  nitroGLYCERIN (NITROSTAT) 0.4 MG SL tablet Place 0.4 mg under the tongue every 5 (five) minutes as needed. For chest pain 06/07/11   Lew Dawes V, MD   BP 91/43  Pulse 101  Temp(Src) 100.9 F (38.3 C) (Rectal)  Resp 26  Ht 6\' 4"  (1.93 m)  Wt 350 lb (158.759 kg)  BMI 42.62 kg/m2  SpO2 96% Physical Exam  Nursing note and vitals reviewed. Constitutional: He is oriented to person, place, and time. He appears well-developed and well-nourished. No distress.  HENT:  Head: Normocephalic and atraumatic.  Eyes: Pupils are equal, round, and reactive to light.  Neck: Normal range of motion.  Cardiovascular: Normal rate, regular rhythm, normal heart sounds and intact distal pulses.  Exam reveals no gallop and no friction rub.   No murmur heard. Pulmonary/Chest: Effort normal. No respiratory distress. He has wheezes (scant wheezes). He has no rales. He exhibits no tenderness.  Abdominal: Soft. Bowel sounds are normal. He exhibits no distension and no mass. There is no tenderness. There is no rebound and no guarding.  Musculoskeletal: Normal range of motion. He exhibits edema (mild edema in bilateral LEs).  Lymphadenopathy:    He has no cervical adenopathy.  Neurological: He is alert and oriented to person, place, and time.  Skin: Skin is warm and dry. He is not diaphoretic.    ED Course  Procedures (including critical care time) Labs Review Labs Reviewed  CBC WITH DIFFERENTIAL - Abnormal; Notable for the  following:    RBC 3.79 (*)    Hemoglobin 11.6 (*)    HCT 36.8 (*)    RDW 16.0 (*)    Neutrophils Relative % 87 (*)    Neutro Abs 8.3 (*)    Lymphocytes Relative 9 (*)    All other components within normal limits  BASIC METABOLIC PANEL - Abnormal; Notable for the following:    Glucose, Bld 114 (*)    GFR calc non Af Amer 86 (*)    All other components within normal limits  PRO B NATRIURETIC PEPTIDE - Abnormal; Notable for the following:    Pro B Natriuretic peptide (BNP) 1647.0 (*)    All other components within normal limits  I-STAT ARTERIAL BLOOD GAS, ED - Abnormal; Notable for the following:    pCO2 arterial 34.7 (*)    pO2, Arterial 66.0 (*)    All other components within normal limits  I-STAT CG4 LACTIC ACID, ED - Abnormal; Notable for the following:    Lactic Acid, Venous 2.57 (*)    All other components within normal limits  CULTURE, BLOOD (ROUTINE X 2)  CULTURE, BLOOD (ROUTINE X 2)  BLOOD GAS, ARTERIAL  PROCALCITONIN  I-STAT TROPOININ, ED    Imaging Review Dg Chest Port 1 View  07/25/2014   CLINICAL DATA:  Respiratory distress  EXAM: PORTABLE CHEST - 1 VIEW  COMPARISON:  05/25/2014  FINDINGS: There are bilateral interstitial and alveolar airspace opacities most severe in the right lower lung. There is prominence of the central pulmonary vasculature. There is no pneumothorax. There is no definite pleural effusion. The heart and mediastinum are stable. There is thoracic aortic atherosclerosis.  The osseous structures are unremarkable.  IMPRESSION: Bilateral interstitial and alveolar airspace opacities, right greater than left, which may reflect pulmonary edema versus multilobar pneumonia.   Electronically Signed   By: Kathreen Devoid   On: 07/25/2014 12:35     EKG Interpretation   Date/Time:  Thursday July 25 2014 12:04:48 EDT Ventricular Rate:  99 PR Interval:  166 QRS Duration: 81 QT Interval:  352 QTC Calculation: 452 R Axis:   64 Text Interpretation:  Sinus  tachycardia Atrial premature complexes Low  voltage, precordial leads Since last tracing rate faster Confirmed by YAO   MD, DAVID ([id number]) on 07/25/2014 12:08:34 PM  MDM   73 year old Caucasian male who presents today with acute onset of shortness of breath. Please see history of present illness for details. On exam patient in moderate distress. Afebrile. Vital signs stable. Patient has mild pitting edema in the bilateral lower extremities with right will worsen left. Wife says this is not new. Oxygen requirement is new. Has received one DuoNeb treatment in route by EMS and steroids. Lung sounds have diminished sounds at the bases but no crackles. Poor air movement. Patient taken off BiPAP and satting well in the upper 90s on room air. Well he states on 2 L nasal cannula and see how she tolerates. We'll also give an additional duoneb treatment now. Differential includes CHF exacerbation of volume overload versus pneumonia. Patient has had fevers and cough for several days. Will obtain CBC, BMP, chest x-ray, ABG, and BNP.  Chest x-ray shows possible multifocal pneumonia versus pulmonary edema. History and exam were consistent with fluid overload. Lasix ordered. Cardiology consulted for admission.  Just prior to administration of Lasix, patient's blood pressure began to dip into the 16O and 06Y systolic.Tachycardic in 105-115 range. Patient reevaluated. He appears to be in no acute distress, mentating normally. However, held off on the Lasix and instead opted to bolus 500 cc normal saline. Patient now spiking fever and despite initial fluid bolus remains hypotensive and tachycardic. Blood cultures obtained. With possibility of multifocal pneumonia as a source for suspected sepsis, patient started on IV Levaquin and a second 500 cc bolus administered. Lactate at 2.5. At this time hospitalist consult is for admission.  Another liter NS ordered. Hospitalist to admit to step-down unit for CAP with sepsis.  Please see their notes for further details regarding his hospital course. Throughout time in ED, pt remained stable.   Final diagnoses:  CAP (community acquired pneumonia)  Paroxysmal atrial fibrillation  Acute diastolic congestive heart failure  Coronary atherosclerosis of unspecified type of vessel, native or graft  Morbid obesity  Old myocardial infarction    Pt was seen under the supervision of Dr. Darl Householder.     Sherian Maroon, MD 07/25/14 1640

## 2014-07-25 NOTE — ED Notes (Signed)
Lab results reported to EDP. 

## 2014-07-26 ENCOUNTER — Encounter (HOSPITAL_COMMUNITY): Payer: Self-pay | Admitting: Physician Assistant

## 2014-07-26 DIAGNOSIS — J449 Chronic obstructive pulmonary disease, unspecified: Secondary | ICD-10-CM | POA: Diagnosis present

## 2014-07-26 DIAGNOSIS — I517 Cardiomegaly: Secondary | ICD-10-CM

## 2014-07-26 DIAGNOSIS — E1159 Type 2 diabetes mellitus with other circulatory complications: Secondary | ICD-10-CM

## 2014-07-26 DIAGNOSIS — I959 Hypotension, unspecified: Secondary | ICD-10-CM | POA: Diagnosis present

## 2014-07-26 DIAGNOSIS — Z8673 Personal history of transient ischemic attack (TIA), and cerebral infarction without residual deficits: Secondary | ICD-10-CM

## 2014-07-26 DIAGNOSIS — A419 Sepsis, unspecified organism: Secondary | ICD-10-CM

## 2014-07-26 LAB — CBC
HEMATOCRIT: 33 % — AB (ref 39.0–52.0)
Hemoglobin: 10 g/dL — ABNORMAL LOW (ref 13.0–17.0)
MCH: 29.7 pg (ref 26.0–34.0)
MCHC: 30.3 g/dL (ref 30.0–36.0)
MCV: 97.9 fL (ref 78.0–100.0)
PLATELETS: 253 10*3/uL (ref 150–400)
RBC: 3.37 MIL/uL — ABNORMAL LOW (ref 4.22–5.81)
RDW: 16.2 % — ABNORMAL HIGH (ref 11.5–15.5)
WBC: 16.3 10*3/uL — AB (ref 4.0–10.5)

## 2014-07-26 LAB — GLUCOSE, CAPILLARY
GLUCOSE-CAPILLARY: 121 mg/dL — AB (ref 70–99)
GLUCOSE-CAPILLARY: 70 mg/dL (ref 70–99)
GLUCOSE-CAPILLARY: 48 mg/dL — AB (ref 70–99)
GLUCOSE-CAPILLARY: 65 mg/dL — AB (ref 70–99)
GLUCOSE-CAPILLARY: 89 mg/dL (ref 70–99)
Glucose-Capillary: 369 mg/dL — ABNORMAL HIGH (ref 70–99)
Glucose-Capillary: 282 mg/dL — ABNORMAL HIGH (ref 70–99)
Glucose-Capillary: 220 mg/dL — ABNORMAL HIGH (ref 70–99)
Glucose-Capillary: 62 mg/dL — ABNORMAL LOW (ref 70–99)
Glucose-Capillary: 86 mg/dL (ref 70–99)

## 2014-07-26 LAB — COMPREHENSIVE METABOLIC PANEL
ALBUMIN: 2.4 g/dL — AB (ref 3.5–5.2)
ALT: 11 U/L (ref 0–53)
AST: 19 U/L (ref 0–37)
Alkaline Phosphatase: 68 U/L (ref 39–117)
Anion gap: 14 (ref 5–15)
BILIRUBIN TOTAL: 0.6 mg/dL (ref 0.3–1.2)
BUN: 26 mg/dL — ABNORMAL HIGH (ref 6–23)
CALCIUM: 7.9 mg/dL — AB (ref 8.4–10.5)
CO2: 22 mEq/L (ref 19–32)
CREATININE: 1.07 mg/dL (ref 0.50–1.35)
Chloride: 99 mEq/L (ref 96–112)
GFR calc Af Amer: 78 mL/min — ABNORMAL LOW (ref >90)
GFR calc non Af Amer: 67 mL/min — ABNORMAL LOW (ref >90)
Glucose, Bld: 443 mg/dL — ABNORMAL HIGH (ref 70–99)
Potassium: 4.3 mEq/L (ref 3.7–5.3)
Sodium: 135 mEq/L — ABNORMAL LOW (ref 137–147)
Total Protein: 5.8 g/dL — ABNORMAL LOW (ref 6.0–8.3)

## 2014-07-26 LAB — TROPONIN I: Troponin I: <0.3 ng/mL (ref <0.30)

## 2014-07-26 LAB — LEGIONELLA ANTIGEN, URINE: LEGIONELLA ANTIGEN, URINE: NEGATIVE

## 2014-07-26 LAB — URINALYSIS, ROUTINE W REFLEX MICROSCOPIC
Bilirubin Urine: NEGATIVE
Glucose, UA: NEGATIVE mg/dL
Hgb urine dipstick: NEGATIVE
Ketones, ur: NEGATIVE mg/dL
Leukocytes, UA: NEGATIVE
NITRITE: NEGATIVE
PROTEIN: NEGATIVE mg/dL
SPECIFIC GRAVITY, URINE: 1.022 (ref 1.005–1.030)
UROBILINOGEN UA: 1 mg/dL (ref 0.0–1.0)
pH: 5.5 (ref 5.0–8.0)

## 2014-07-26 LAB — BASIC METABOLIC PANEL
Anion gap: 14 (ref 5–15)
BUN: 30 mg/dL — ABNORMAL HIGH (ref 6–23)
CO2: 22 meq/L (ref 19–32)
Calcium: 8.8 mg/dL (ref 8.4–10.5)
Chloride: 105 mEq/L (ref 96–112)
Creatinine, Ser: 0.88 mg/dL (ref 0.50–1.35)
GFR calc Af Amer: >90 mL/min (ref >90)
GFR calc non Af Amer: 84 mL/min — ABNORMAL LOW (ref >90)
Glucose, Bld: 120 mg/dL — ABNORMAL HIGH (ref 70–99)
POTASSIUM: 3.9 meq/L (ref 3.7–5.3)
SODIUM: 141 meq/L (ref 137–147)

## 2014-07-26 LAB — HEMOGLOBIN A1C
Hgb A1c MFr Bld: 7.3 % — ABNORMAL HIGH (ref <5.7)
Mean Plasma Glucose: 163 mg/dL — ABNORMAL HIGH (ref <117)

## 2014-07-26 LAB — HIV ANTIBODY (ROUTINE TESTING W REFLEX): HIV: NONREACTIVE

## 2014-07-26 MED ORDER — INSULIN ASPART 100 UNIT/ML ~~LOC~~ SOLN
12.0000 [IU] | Freq: Every day | SUBCUTANEOUS | Status: DC
  Administered 2014-07-27 – 2014-08-05 (×7): 12 [IU] via SUBCUTANEOUS

## 2014-07-26 MED ORDER — INSULIN ASPART 100 UNIT/ML ~~LOC~~ SOLN: 6.0000 [IU] | Freq: Every day | SUBCUTANEOUS | Status: DC

## 2014-07-26 MED ORDER — INSULIN ASPART 100 UNIT/ML ~~LOC~~ SOLN
12.0000 [IU] | Freq: Once | SUBCUTANEOUS | Status: AC
  Administered 2014-07-26: 12 [IU] via SUBCUTANEOUS

## 2014-07-26 MED ORDER — INSULIN ASPART 100 UNIT/ML ~~LOC~~ SOLN: 6.0000 [IU] | Freq: Three times a day (TID) | SUBCUTANEOUS | Status: DC

## 2014-07-26 MED ORDER — GLIMEPIRIDE 4 MG PO TABS
4.0000 mg | ORAL_TABLET | Freq: Two times a day (BID) | ORAL | Status: DC
  Administered 2014-07-26: 4 mg via ORAL
  Filled 2014-07-26 (×3): qty 1

## 2014-07-26 MED ORDER — INSULIN ASPART 100 UNIT/ML ~~LOC~~ SOLN
12.0000 [IU] | Freq: Two times a day (BID) | SUBCUTANEOUS | Status: DC
  Administered 2014-07-26: 12 [IU] via SUBCUTANEOUS

## 2014-07-26 MED ORDER — INSULIN ASPART 100 UNIT/ML ~~LOC~~ SOLN
10.0000 [IU] | Freq: Two times a day (BID) | SUBCUTANEOUS | Status: DC
  Administered 2014-07-27 – 2014-08-04 (×10): 10 [IU] via SUBCUTANEOUS

## 2014-07-26 MED ORDER — PREDNISONE 50 MG PO TABS
60.0000 mg | ORAL_TABLET | Freq: Every day | ORAL | Status: DC
  Administered 2014-07-27 – 2014-07-29 (×3): 60 mg via ORAL
  Filled 2014-07-26 (×4): qty 1

## 2014-07-26 MED ORDER — METOPROLOL TARTRATE 25 MG PO TABS
25.0000 mg | ORAL_TABLET | Freq: Two times a day (BID) | ORAL | Status: DC
  Administered 2014-07-26 – 2014-07-29 (×5): 25 mg via ORAL
  Filled 2014-07-26 (×8): qty 1

## 2014-07-26 MED ORDER — PIOGLITAZONE HCL 45 MG PO TABS
45.0000 mg | ORAL_TABLET | Freq: Every day | ORAL | Status: DC
  Administered 2014-07-26 – 2014-07-27 (×2): 45 mg via ORAL
  Filled 2014-07-26 (×2): qty 1

## 2014-07-26 MED ORDER — PERFLUTREN LIPID MICROSPHERE
1.0000 mL | INTRAVENOUS | Status: AC | PRN
  Administered 2014-07-26: 2 mL via INTRAVENOUS
  Filled 2014-07-26: qty 10

## 2014-07-26 MED ORDER — GLIMEPIRIDE 2 MG PO TABS
2.0000 mg | ORAL_TABLET | Freq: Two times a day (BID) | ORAL | Status: DC
  Filled 2014-07-26 (×3): qty 1

## 2014-07-26 MED ORDER — PIPERACILLIN-TAZOBACTAM 3.375 G IVPB
3.3750 g | Freq: Three times a day (TID) | INTRAVENOUS | Status: DC
  Administered 2014-07-26 – 2014-08-01 (×17): 3.375 g via INTRAVENOUS
  Filled 2014-07-26 (×20): qty 50

## 2014-07-26 NOTE — Progress Notes (Signed)
Echocardiogram 2D Echocardiogram with Definity has been performed.  Charles [name] 07/26/2014, 3:42 PM

## 2014-07-26 NOTE — Progress Notes (Addendum)
Patient: Charles [name] / Admit Date: 07/25/2014 / Date of Encounter: 07/26/2014, 10:56 AM   Subjective: Feeling much better, but still not at baseline. No further fever or chills.   Objective: Telemetry: NSR/sinus tach at times (brief run of faster HR yesterday PM but still appears likely sinus when it slows down - no abrupt rhythm changes) Physical Exam: Blood pressure 119/64, pulse 89, temperature 97.6 F (36.4 C), temperature source Oral, resp. rate 27, height 6\' 4"  (1.93 m), weight 350 lb (158.759 kg), SpO2 100.00%. General: Well developed, obese WM in no acute distress. Head: Normocephalic, atraumatic, sclera non-icteric, no xanthomas, nares are without discharge. Neck: JVP not elevated. Lungs: Diminished BS throughout but no wheezes, rales, or rhonchi. Breathing is unlabored. Heart: RRR borderline tachy S1 S2 without murmurs, rubs, or gallops.  Abdomen: Soft, non-tender, non-distended with normoactive bowel sounds. No rebound/guarding. Extremities: No clubbing or cyanosis. Tr sockline edema. Pedal pulses in tact. Neuro: Alert and oriented X 3. Moves all extremities spontaneously. Extremely hard of hearing. Psych:  Responds to questions appropriately with a normal affect.   Intake/Output Summary (Last 24 hours) at 07/26/14 1056 Last data filed at 07/26/14 0300  Gross per 24 hour  Intake   3900 ml  Output    125 ml  Net   3775 ml    Inpatient Medications:  . aspirin EC  81 mg Oral Daily  . azithromycin  500 mg Intravenous Q24H  . cefTRIAXone (ROCEPHIN)  IV  1 g Intravenous Q24H  . clopidogrel  75 mg Oral Daily  . enoxaparin (LOVENOX) injection  80 mg Subcutaneous Q24H  . gabapentin  300 mg Oral BID  . glimepiride  4 mg Oral BID WC  . insulin aspart  12 Units Subcutaneous BID AC & HS  . insulin aspart  12 Units Subcutaneous QAC supper  . insulin aspart  6-10 Units Subcutaneous TID WC  . insulin aspart  6-10 Units Subcutaneous QHS  . levalbuterol  0.63 mg Nebulization TID   . metoprolol  25 mg Oral BID  . pioglitazone  45 mg Oral Daily  . [START ON 07/27/2014] predniSONE  60 mg Oral Q breakfast  . simvastatin  20 mg Oral q1800  . sodium chloride  3 mL Intravenous Q12H  . vancomycin  1,500 mg Intravenous Q12H   Infusions:  . sodium chloride 1,000 mL (07/26/14 0736)    Labs:  Recent Labs  07/25/14 1204 07/26/14 0113  NA 141 135*  K 3.9 4.3  CL 101 99  CO2 27 22  GLUCOSE 114* 443*  BUN 19 26*  CREATININE 0.83 1.07  CALCIUM 8.5 7.9*    Recent Labs  07/26/14 0113  AST 19  ALT 11  ALKPHOS 68  BILITOT 0.6  PROT 5.8*  ALBUMIN 2.4*    Recent Labs  07/25/14 1204 07/26/14 0113  WBC 9.5 16.3*  NEUTROABS 8.3*  --   HGB 11.6* 10.0*  HCT 36.8* 33.0*  MCV 97.1 97.9  PLT 264 253    Recent Labs  07/25/14 2115 07/26/14 0113 07/26/14 0737  TROPONINI <0.30 <0.30 <0.30   No components found with this basename: POCBNP,  No results found for this basename: HGBA1C,  in the last 72 hours   Radiology/Studies:  Dg Chest Port 1 View  07/25/2014   CLINICAL DATA:  Respiratory distress  EXAM: PORTABLE CHEST - 1 VIEW  COMPARISON:  05/25/2014  FINDINGS: There are bilateral interstitial and alveolar airspace opacities most severe in the right lower lung. There  is prominence of the central pulmonary vasculature. There is no pneumothorax. There is no definite pleural effusion. The heart and mediastinum are stable. There is thoracic aortic atherosclerosis.  The osseous structures are unremarkable.  IMPRESSION: Bilateral interstitial and alveolar airspace opacities, right greater than left, which may reflect pulmonary edema versus multilobar pneumonia.   Electronically Signed   By: Kathreen Devoid   On: 07/25/2014 12:35     Assessment and Plan  Charles [name] is a 73 y.o. male with a history of CAD (MI in 2000), PAF, DM with peripheral neuropathy, OSA on CPAP, HTN, CVA (2002) and morbid obesity BMI 42.62 kg/(m^2). who was brought to [facility] via EMS for hypoxic  respiratory failure, fever, chills, and felt to have CAP/CHF. Noted to be tachycardic and hypotensive in the ED which improved with fluid.  1. Acute hypoxic respiratory failure with fever/chills/CAP and early septic shock with lactic acidosis 2. Doubt CHF 3. Hypotension 4. PAF, maintaining NSR 5. CAD s/p stent 2000, NSTEMI 2013 treated medically 6. Diabetes mellitus with retinopathy/neuropathy, uncontrolled blood sugars here   Given fever and constellation of symptoms, feel this is likely more PNA than CHF. pBNP was elevated 1647 but BP too low for diuresis at present time. Weight is actually down 8 lbs from last outpatient visit. He has improved with IV fluids and antibiotics administration. Troponins negative. Hopeful BP will continue to improve with antibiotic therapy - no longer febrile. Cardiac meds were held on admission due to low BP. BP now 119/64, so will restart Lopressor at lower dose of 25mg  BID. Await 2D echocardiogram. He remains on IV fluid at this time. IM would like to continue as they suspect some volume depletion with high CBGs. Will also ask for daily weights  We had a long talk about his anticoagulation. Per prior note, he is only on ASA and Plavix because the patient decided to discontinue Xarelto due to possible rash, cost and lawyers ads on TV. He reaffirms this, saying he thinks the cost of Xarelto is only high because of all the lawsuits that come from Xarelto complications. We talked about the fact that aspirin and Plavix does not cover you for stroke due to atrial fibrillation (and carries bleeding risk in and of itself) but he is insistent upon continuing this regimen. He does not want to pursue any other blood thinner at present time. He also has h/o rash on Coumadin. We can reapproach this issue if he changes his mind. For now he accepts the risk of stroke (CHADSVASC score = 5, pending echo for LV function). Maintaining NSR at present time.   Signed, Melina Copa  PA-C  Attending Note:   The patient was seen and examined.  Agree with assessment and plan as noted above.  Changes made to the above note as needed.  Pt has had a reaction to xarelto.  The family member in the room does not think he has actually had coumadin. I told that there were 3 other options for NOAC - Pradaxa, Eliquis, Sayvasa. For not he refuses to take anything other than ASA and Plavix. He should discuss this with his doctor. He is back in NSR for now  He is at risk for recurrent CVA if he does not agree to take coumadin or one of the new agents. ASA and plavix are not adequate but are probably better than nothing.  He is very hard of hearing and explaining the various meds with him was very challanging.   In addition,  he has some unusual thoughts about why some meds are so expensive  He will follow up with Dr. Harrington Challenger.  We will sign off.  Call for questions. Continue treatement for his pneumonia   Ramond Dial., MD, [facility] 07/26/2014, 12:40 PM [address],  [place] Pager [phone]

## 2014-07-26 NOTE — Progress Notes (Signed)
CRITICAL VALUE ALERT  Critical value received:  Positive Blood Cultures: Aerobic Gram negative  rods  Date of notification:  07/26/2013 Time of notification:  0071 Critical value read back:Yes.    Nurse who received alert:  Glee Arvin  MD notified (1st page):  Dr.Woods  Time of first page:  MD on floor  MD notified (2nd page):  Time of second page:  Responding MD:  Dr.Woods  Time MD responded:

## 2014-07-26 NOTE — Progress Notes (Signed)
Spoke with patient about diabetes and home regimen for diabetes control. Patient reports that he is followed by his PCP for diabetes management and currently he takes Amaryl 4 mg BID, Actos 45 mg daily, and Humalog 5-20 units QID as an outpatient for diabetes control. Patient states that he checks his glucose frequently throughout the day and he self adjust Humalog doses to bring his blood sugar down (currently uses 1 unit for every 10 mg/dl).  Inquired about knowledge about A1C and patient reports that he knows what an A1C is and reports that his A1C is usually less than 7%.  Discussed impact of nutrition, exercise, stress, sickness, and medications on diabetes control. Patient reports that he is very in tuned to his body and how he feels when his blood sugar is too low or too high. He targets his blood sugar to be around 100 mg/dl and states that he feels low if it is less than 100 mg/dl and he feels when his blood sugar is over 200 mg/dl. Patient was very frustrated with medical staff last night because "they would not listen to him regarding the amount of insulin he was being given". Patient states that he talked with Lissa Merlin, NP and she listened to him and agreed to allow him to take insulin here in the hospital as he does as an outpatient. Patient's blood glucose has corrected nicely with his regimen he uses an an outpatient and he is very happy that the inpatient glycemic orders were changed to reflect his outpatient regimen. Patient verbalized understanding of information discussed and he states that he has no further questions at this time related to diabetes.   Thanks, Barnie Alderman, RN, MSN, CCRN Diabetes Coordinator Inpatient Diabetes Program [phone] (Team Pager) [phone] ([facility] office) [phone] [facility] office)

## 2014-07-26 NOTE — Progress Notes (Signed)
CRITICAL VALUE ALERT  Critical value received:  Glucose 65  Date of notification:  07/26/2014 Time of notification:  0488 Critical value read back:Yes.    Nurse who received alert:  Glee Arvin MD notified (1st page):  Dr. Sherral Hammers Time of first page:  MD on floor MD notified (2nd page):  Time of second page:  Responding MD:  Dr.Woods  Time MD responded:    262ml of Orange juice given

## 2014-07-26 NOTE — Progress Notes (Signed)
[facility]  Charles [name] KPT:[id number] DOB: 1941/06/03 DOA: 07/25/2014 PCP: [name], MD   Brief narrative: 73 y.o. male with a past medical history of paroxysmal atrial fibrillation, obstructive sleep apnea, history of stroke, history of coronary artery disease on medical management, diabetes mellitus type II, with neuropathy, and retinopathy, hypertension. Endorsed was in his usual state of health as of this past Sunday when he started developing minimal shortness of breath. He reported "cold like symptoms". Over the next 2-3 days his symptoms progressively worsened. He did not have any cough initially but started coughing 24 hours prior to presentation and developed fever up to 101.55F. On the morning of admisison he woke up to go to the bathroom and became very short of breath, even with minimal exertion. He normally uses a CPAP every night and despite the use of CPAP he did not feel any better. His wife gave him her oxygen and one of her inhalers without any relief. Since his symptoms were not improving they called his primary care physician, who recommended that they call 911. When EMS arrived at the house he was found to be in respiratory distress. He was found to be cold, clammy, diaphoretic. Saturations initially were in 72%. He was placed on a nonrebreather and subsequently, on BiPAP. Was given Nebulizer treatments, steroids, and, he was brought into the emergency department.   In the emergency department it was noted that his blood pressure had been dropping. He was also tachycardic. Patient denied any dizziness, or any chest pain, per se. He was experiencing pleuritic type chest discomfort with coughing.  HPI/Subjective: Primarily very frustrated over his glycemic control management. Angry that at admission was placed on a hospital protocol and not his home regimen which has kept his sugars controlled for "years". Discussed plans to adapt home  regimen to the hospital setting with some exceptions and he was agreeable to this plan.  Assessment/Plan: Acute respiratory failure with hypoxia;  CAP    COPD exacerbation -exam c/w infectious etiology-leukocytosis and fever -cont current anbx's -add IS/flutter valve -add Prednisone for wheezing -cont nebs- Xopenex since h/o AF -Not on oxygen at home    Septic shock/Hypotension -seems primarily due to volume depletion esp in setting of hyperglycemia -cont IVFs at 125 but monitor for overload -FU on cultures -lactic acid did trend up from 2.6 to 4.7 but this could be due more to evolving DKA and dehydration that true sepsis ## 12 pm AG still up with elevated BUN so continue IVFs      HYPERTENSION -home meds on hold except for low dose BB for AF    Type 2 diabetes, uncontrolled, with retinopathy and neuropathy -serum CBG was 443 this am but was off home OHAs so will resume (Amaryl and Actos) -AG was 14 but suspect DH driven so will repeat at 12p- would like to avoid insulin drip if possible -resume pt home Novolog scale: 12 units with breakfast, supper and bedtime with custom SSI as per home regimen- pt typically takes the scheduled Novolog then 1 hour later checks is sugar and gives additional Novolog based on what he thinks he may need-ordered coverage based on this replacement schedule -AIC in June was 7.9  ## AG still 14 at 12 pm in setting of CBG 120 so suspect DH driving so will NOT convert to insulin drip ##However throughout the day patient has had numerous low readings on current regimen, most likely multifactorial to  include COPD exacerbation,CAP, sepsis and acute diastolic CHF. -Decrease Amaryl to 2 mg BID -Decrease breakfast and  Bedtime NovoLog 10 units; will reevaluate effectiveness of changes in the a.m.    PAF (paroxysmal atrial fibrillation) -Cards following -maintaining NSR -resume low dose BB as BP tolerates -in past has declined Coumadin- had rash on  Xarelto but agreed to take Plavix    ? Acute diastolic CHF  -clinical exam and WU more c/w infection and not heart failure -follow -Echo pending    MYOCARDIAL INFARCTION, HX OF/CORONARY ARTERY DISEASE -per Cards -stable    History of CVA (cerebrovascular accident)   DVT prophylaxis: Lovenox Code Status: Full Family Communication: Patient and family at bedside-extended conversation regarding glycemic control Disposition Plan/Expected LOS: Step down   Consultants: Cardio   Procedures: 2-D echocardiogram pending   Cultures: 8/13 blood right arm/hand positive for GNR cultures pending 8/13 MRSA PCR negative 8/14 urine pending   Antibiotics: Azithromycin 8/13 >>> stopped 8/14 Ceftriaxone 8/13 >>> stopped 8/14  Levofloxacin x1 dose Vancomycin 8./13>> Zosyn 8/14>>   Objective: Blood pressure 119/64, pulse 94, temperature 97.6 F (36.4 C), temperature source Oral, resp. rate 18, height 6\' 4"  (1.93 m), weight 350 lb (158.759 kg), SpO2 98.00%.  Intake/Output Summary (Last 24 hours) at 07/26/14 1114 Last data filed at 07/26/14 0300  Gross per 24 hour  Intake   3900 ml  Output    125 ml  Net   3775 ml     Exam: Gen: No acute respiratory distress at rest Chest: Coars to auscultation bilaterally with audible wheezes, a few scattered rhonchi but no crackles, 3L Cardiac: Regular rate and rhythm, S1-S2, no rubs murmurs or gallops, no peripheral edema, no JVD-blood pressure is soft Abdomen: Soft nontender nondistended without obvious hepatosplenomegaly, no ascites Extremities: Symmetrical in appearance without cyanosis, clubbing or effusion  Scheduled Meds:  Scheduled Meds: . aspirin EC  81 mg Oral Daily  . azithromycin  500 mg Intravenous Q24H  . cefTRIAXone (ROCEPHIN)  IV  1 g Intravenous Q24H  . clopidogrel  75 mg Oral Daily  . enoxaparin (LOVENOX) injection  80 mg Subcutaneous Q24H  . gabapentin  300 mg Oral BID  . glimepiride  4 mg Oral BID WC  . insulin  aspart  12 Units Subcutaneous BID AC & HS  . insulin aspart  12 Units Subcutaneous QAC supper  . insulin aspart  6-10 Units Subcutaneous TID WC  . insulin aspart  6-10 Units Subcutaneous QHS  . levalbuterol  0.63 mg Nebulization TID  . metoprolol  25 mg Oral BID  . pioglitazone  45 mg Oral Daily  . [START ON 07/27/2014] predniSONE  60 mg Oral Q breakfast  . simvastatin  20 mg Oral q1800  . sodium chloride  3 mL Intravenous Q12H  . vancomycin  1,500 mg Intravenous Q12H   Continuous Infusions: . sodium chloride 1,000 mL (07/26/14 0736)    Data Reviewed: Basic Metabolic Panel:  Recent Labs Lab 07/25/14 1204 07/26/14 0113  NA 141 135*  K 3.9 4.3  CL 101 99  CO2 27 22  GLUCOSE 114* 443*  BUN 19 26*  CREATININE 0.83 1.07  CALCIUM 8.5 7.9*   Liver Function Tests:  Recent Labs Lab 07/26/14 0113  AST 19  ALT 11  ALKPHOS 68  BILITOT 0.6  PROT 5.8*  ALBUMIN 2.4*   No results found for this basename: LIPASE, AMYLASE,  in the last 168 hours No results found for this basename: AMMONIA,  in the last  168 hours CBC:  Recent Labs Lab 07/25/14 1204 07/26/14 0113  WBC 9.5 16.3*  NEUTROABS 8.3*  --   HGB 11.6* 10.0*  HCT 36.8* 33.0*  MCV 97.1 97.9  PLT 264 253   Cardiac Enzymes:  Recent Labs Lab 07/25/14 2115 07/26/14 0113 07/26/14 0737  TROPONINI <0.30 <0.30 <0.30   BNP (last 3 results)  Recent Labs  07/25/14 1209  PROBNP 1647.0*   CBG:  Recent Labs Lab 07/25/14 2119 07/26/14 0644 07/26/14 0839  GLUCAP 358* 369* 282*    Recent Results (from the past 240 hour(s))  CULTURE, BLOOD (ROUTINE X 2)     Status: None   Collection Time    07/25/14  1:40 PM      Result Value Ref Range Status   Specimen Description BLOOD ARM RIGHT   Final   Special Requests BOTTLES DRAWN AEROBIC AND ANAEROBIC 10CC   Final   Culture  Setup Time     Final   Value: 07/25/2014 17:26     Performed at [facility]   Culture     Final   Value:        BLOOD CULTURE  RECEIVED NO GROWTH TO DATE CULTURE WILL BE HELD FOR 5 DAYS BEFORE ISSUING A FINAL NEGATIVE REPORT     Performed at [facility]   Report Status PENDING   Incomplete  CULTURE, BLOOD (ROUTINE X 2)     Status: None   Collection Time    07/25/14  1:50 PM      Result Value Ref Range Status   Specimen Description BLOOD HAND RIGHT   Final   Special Requests BOTTLES DRAWN AEROBIC AND ANAEROBIC 10CC   Final   Culture  Setup Time     Final   Value: 07/25/2014 17:26     Performed at [facility]   Culture     Final   Value:        BLOOD CULTURE RECEIVED NO GROWTH TO DATE CULTURE WILL BE HELD FOR 5 DAYS BEFORE ISSUING A FINAL NEGATIVE REPORT     Performed at [facility]   Report Status PENDING   Incomplete  MRSA PCR SCREENING     Status: None   Collection Time    07/25/14  7:10 PM      Result Value Ref Range Status   MRSA by PCR NEGATIVE  NEGATIVE Final   Comment:            The GeneXpert MRSA Assay (FDA     approved for NASAL specimens     only), is one component of a     comprehensive MRSA colonization     surveillance program. It is not     intended to diagnose MRSA     infection nor to guide or     monitor treatment for     MRSA infections.     Studies:  Recent x-ray studies have been reviewed in detail by the Attending Physician  Time spent :      Erin Hearing, [place] [facility]  [phone] Pager [phone]   **If unable to reach the above provider after paging please contact the [place] @ [phone]  On-Call/Text Page:      [email or web address]      password [email or web address]  If 7PM-7AM, please contact night-coverage [email or web address] Password [email or web address] 07/26/2014, 11:14 AM   LOS: 1 day   Examined patient and discussed assessment and plan with ANP Ebony Hail, and agree with the above  plan, except for the diabetic insulin regimen; see changes above.  Patient with multiple complex medical problems > 60 minutes spent in direct patient care

## 2014-07-26 NOTE — Progress Notes (Addendum)
CRITICAL VALUE ALERT  Critical value received:  Glucose 48  Date of notification:  07/26/2014  Time of notification:  1700  Critical value read back:Yes.    Nurse who received alert:  Glee Arvin  MD notified (1st page):  Dr.Woods  Time of first page:  Dr. Sherral Hammers on floor  MD notified (2nd page):  Time of second page:  Responding MD:  Dr.Woods   Time MD responded:    249mLf Orange Juice given

## 2014-07-27 DIAGNOSIS — E785 Hyperlipidemia, unspecified: Secondary | ICD-10-CM

## 2014-07-27 DIAGNOSIS — I10 Essential (primary) hypertension: Secondary | ICD-10-CM

## 2014-07-27 DIAGNOSIS — J441 Chronic obstructive pulmonary disease with (acute) exacerbation: Secondary | ICD-10-CM

## 2014-07-27 LAB — CBC
HCT: 35 % — ABNORMAL LOW (ref 39.0–52.0)
HEMOGLOBIN: 11.1 g/dL — AB (ref 13.0–17.0)
MCH: 31.1 pg (ref 26.0–34.0)
MCHC: 31.7 g/dL (ref 30.0–36.0)
MCV: 98 fL (ref 78.0–100.0)
PLATELETS: 252 10*3/uL (ref 150–400)
RBC: 3.57 MIL/uL — AB (ref 4.22–5.81)
RDW: 15.7 % — ABNORMAL HIGH (ref 11.5–15.5)
WBC: 13.1 10*3/uL — ABNORMAL HIGH (ref 4.0–10.5)

## 2014-07-27 LAB — CULTURE, BLOOD (ROUTINE X 2)

## 2014-07-27 LAB — GLUCOSE, CAPILLARY
GLUCOSE-CAPILLARY: 63 mg/dL — AB (ref 70–99)
GLUCOSE-CAPILLARY: 320 mg/dL — AB (ref 70–99)
GLUCOSE-CAPILLARY: 260 mg/dL — AB (ref 70–99)
Glucose-Capillary: 104 mg/dL — ABNORMAL HIGH (ref 70–99)
Glucose-Capillary: 155 mg/dL — ABNORMAL HIGH (ref 70–99)
Glucose-Capillary: 318 mg/dL — ABNORMAL HIGH (ref 70–99)
Glucose-Capillary: 292 mg/dL — ABNORMAL HIGH (ref 70–99)

## 2014-07-27 LAB — BASIC METABOLIC PANEL
ANION GAP: 10 (ref 5–15)
BUN: 28 mg/dL — ABNORMAL HIGH (ref 6–23)
CO2: 25 mEq/L (ref 19–32)
Calcium: 8.5 mg/dL (ref 8.4–10.5)
Chloride: 105 mEq/L (ref 96–112)
Creatinine, Ser: 0.82 mg/dL (ref 0.50–1.35)
GFR calc non Af Amer: 86 mL/min — ABNORMAL LOW (ref >90)
Glucose, Bld: 67 mg/dL — ABNORMAL LOW (ref 70–99)
POTASSIUM: 4.2 meq/L (ref 3.7–5.3)
Sodium: 140 mEq/L (ref 137–147)

## 2014-07-27 LAB — PROCALCITONIN: PROCALCITONIN: 2.76 ng/mL

## 2014-07-27 LAB — URINE CULTURE

## 2014-07-27 LAB — LACTIC ACID, PLASMA: LACTIC ACID, VENOUS: 1.5 mmol/L (ref 0.5–2.2)

## 2014-07-27 MED ORDER — INSULIN ASPART 100 UNIT/ML ~~LOC~~ SOLN
0.0000 [IU] | SUBCUTANEOUS | Status: DC
  Administered 2014-07-27: 11 [IU] via SUBCUTANEOUS
  Administered 2014-07-27: 15 [IU] via SUBCUTANEOUS
  Administered 2014-07-28: 3 [IU] via SUBCUTANEOUS
  Administered 2014-07-28: 7 [IU] via SUBCUTANEOUS
  Administered 2014-07-28: 4 [IU] via SUBCUTANEOUS
  Administered 2014-07-28: 15 [IU] via SUBCUTANEOUS
  Administered 2014-07-29: 6 [IU] via SUBCUTANEOUS
  Administered 2014-07-29: 4 [IU] via SUBCUTANEOUS
  Administered 2014-07-29: 15 [IU] via SUBCUTANEOUS
  Administered 2014-07-29 (×2): 7 [IU] via SUBCUTANEOUS
  Administered 2014-07-30 (×2): 3 [IU] via SUBCUTANEOUS
  Administered 2014-07-30: 4 [IU] via SUBCUTANEOUS
  Administered 2014-07-30 – 2014-07-31 (×2): 3 [IU] via SUBCUTANEOUS
  Administered 2014-07-31 – 2014-08-01 (×4): 4 [IU] via SUBCUTANEOUS
  Administered 2014-08-01: 11 [IU] via SUBCUTANEOUS
  Administered 2014-08-02 (×2): 4 [IU] via SUBCUTANEOUS
  Administered 2014-08-02 (×2): 7 [IU] via SUBCUTANEOUS

## 2014-07-27 MED ORDER — INSULIN ASPART 100 UNIT/ML ~~LOC~~ SOLN
12.0000 [IU] | Freq: Every day | SUBCUTANEOUS | Status: DC
  Administered 2014-07-28 – 2014-08-06 (×8): 12 [IU] via SUBCUTANEOUS

## 2014-07-27 MED ORDER — IPRATROPIUM-ALBUTEROL 0.5-2.5 (3) MG/3ML IN SOLN
3.0000 mL | Freq: Four times a day (QID) | RESPIRATORY_TRACT | Status: DC
  Administered 2014-07-27 – 2014-07-29 (×5): 3 mL via RESPIRATORY_TRACT
  Filled 2014-07-27 (×9): qty 3

## 2014-07-27 NOTE — ED Provider Notes (Signed)
I saw and evaluated the patient, reviewed the resident's note and I agree with the findings and plan.   EKG Interpretation   Date/Time:  Thursday July 25 2014 12:04:48 EDT Ventricular Rate:  99 PR Interval:  166 QRS Duration: 81 QT Interval:  352 QTC Calculation: 452 R Axis:   64 Text Interpretation:  Sinus tachycardia Atrial premature complexes Low  voltage, precordial leads Since last tracing rate faster Confirmed by Statia Burdick   MD, Solomia Harrell ([id number]) on 07/25/2014 12:08:34 PM      Charles [name] is a 73 y.o. male hx of CAD, HTN, CVA, OSA on CPAP, DM here with shortness of breath. Has been having cold like symptoms for several days, fever 101 yesterday. Worsening shortness of breath today. Wife gave him her oxygen without much relief. EMS noted pulse ox in the 70s on arrival and placed him on cpap. Given solumedrol, nebs by EMS. On exam, patient in mod distress. Pulse ox in mid 90s on arrival. BP stable initially. Diffuse wheezing, diminished lung sounds on bases. Cardiac exam unremarkable. 2+ pitting edema bilaterally. I am concerned for possible pulmonary edema vs pneumonia. ABG reassuring on oxygen. Patient off bipap. CXR showed possible pneumonia vs pulmonary edema. Cardiology consulted, ordered lasix. However, patient started to drop BP to the 90s. Held lasix, given IVF. Sepsis workup initiated. Levaquin ordered. After 2 boluses, still hypotensive. Concerned for possible septic shock. Hospitalist will admit to stepdown for further stabilization.    Level V caveat- condition of patient   CRITICAL CARE Performed by: Darl Householder, Yader Criger   Total critical care time: 30 min   Critical care time was exclusive of separately billable procedures and treating other patients.  Critical care was necessary to treat or prevent imminent or life-threatening deterioration.  Critical care was time spent personally by me on the following activities: development of treatment plan with patient and/or surrogate as  well as nursing, discussions with consultants, evaluation of patient's response to treatment, examination of patient, obtaining history from patient or surrogate, ordering and performing treatments and interventions, ordering and review of laboratory studies, ordering and review of radiographic studies, pulse oximetry and re-evaluation of patient's condition.   Wandra Arthurs, MD 07/27/14 [phone]

## 2014-07-27 NOTE — Progress Notes (Signed)
[facility] - Stepdown/ICU TEAM Progress Note  Charles [name] OIZ:[id number] DOB: April 06, 1941 DOA: 07/25/2014 PCP: [name], MD  Admit HPI / Brief Narrative: 73 y.o. WM PMHx Paroxysmal atrial fibrillation, obstructive sleep apnea, Hx stroke,Hx CAD, DM Type 2 with neuropathy, and retinopathy, HTN, HLD.  Endorsed was in his usual state of health as of this past Sunday when he started developing minimal shortness of breath. He reported "cold like symptoms". Over the next 2-3 days his symptoms progressively worsened. He did not have any cough initially but started coughing 24 hours prior to presentation and developed fever up to 101.60F. On the morning of admisison he woke up to go to the bathroom and became very short of breath, even with minimal exertion. He normally uses a CPAP every night and despite the use of CPAP he did not feel any better. His wife gave him her oxygen and one of her inhalers without any relief. Since his symptoms were not improving they called his primary care physician, who recommended that they call 911. When EMS arrived at the house he was found to be in respiratory distress. He was found to be cold, clammy, diaphoretic. Saturations initially were in 72%. He was placed on a nonrebreather and subsequently, on BiPAP. Was given Nebulizer treatments, steroids, and, he was brought into the emergency department.  In the emergency department it was noted that his blood pressure had been dropping. He was also tachycardic. Patient denied any dizziness, or any chest pain, per se. He was experiencing pleuritic type chest discomfort with coughing.   HPI/Subjective: 8/15 A./O. x4, continues to have positive SOB/DOE. States Not on oxygen at home.  Assessment/Plan:  Sepsis/Acute respiratory failure with hypoxia;  -Patient positive for Haemophilus influenza which is one of the HACEK organisms which will require patient to obtain TEE when stable -In a.m. will consult infectious  disease  CAP -See sepsis/acute respiratory failure with hypoxia   COPD exacerbation  -cont current anbx's  -Continue flutter valve  -Continue Prednisone 60 mg daily -cont nebs- Xopenex since PRN -DuoNeb QID -Continue O2 to maintain SpO2 > 93%    Septic shock/Hypotension  -seems primarily due to volume depletion esp in setting of hyperglycemia  -cont IVFs at 125 but monitor for overload  -8/15 lactic acid = 1.5 down from high of 4.7   HYPERTENSION  -home meds on hold except for low dose BB for AF  -Patient continues to be mildly hypotensive continue normal saline at 125 ml/hr  PAF (paroxysmal atrial fibrillation)  -Cards signed off.   -Sinus tachycardia   -Continue metoprolol 25 mg BID -Spoke at length with patient and wife (who is in Therapist, sports) concerning the indications for NOAC in this high risk patient. Both stated they understand and are willing to take the risk of CVA/MI  -Continue with Plavix 75 mg daily -Continue with aspirin 81 mg daily  MYOCARDIAL INFARCTION, HX OF/CORONARY ARTERY DISEASE  -per Cards  -stable   Type 2 diabetes, uncontrolled, with retinopathy and neuropathy  -8/15  AG =10 -Continue patient's customized insulin regimen except for;  Lunch NovoLog 12 units  Resistant SSI -Stop Actos and Amaryl secondary to multiple episodes of hypoglycemia   History of CVA (cerebrovascular accident) -Spoke at length to patient and wife concerning the efficacy of NOAC over current regimen given his risk factors. Patient and wife (who is an Therapist, sports) stapling understands the risk and will decline at this time.  HLD   -Continue Zocor 40 mg daily (patient  was on call call 40 mg daily at home) - Obtain lipid panel      Code Status: FULL Family Communication: no family present at time of exam Disposition Plan: Resolution sepsis/bacteremia    Consultants: Dr. Doren Custard Nahser(cardiology)   Procedure/Significant Events: 8/14 echocardiogram Left ventricle: mild concentric  hypertrophy. LVEF=55% to 60%. -Ascending aorta: ascending aorta was mildly dilated. - Left atrium: mildly dilated.    Culture 8/13 blood right arm/hand positive for (GNR) HAEMOPHILUS INFLUENZAE 8/13 MRSA PCR negative  8/14 urine negative   Antibiotics: Azithromycin 8/13 >>> stopped 8/14  Ceftriaxone 8/13 >>> stopped 8/14  Levofloxacin x1 dose  Vancomycin 8./13>>  Zosyn 8/14>>   DVT prophylaxis: Lovenox   Devices NA   LINES / TUBES:  8/13 20ga left hand 8/13 20ga left antecubital    Continuous Infusions: . sodium chloride 1,000 mL (07/26/14 0736)    Objective: VITAL SIGNS: Temp: 98.2 F (36.8 C) (08/15 0758) Temp src: Oral (08/15 0758) BP: 110/62 mmHg (08/15 0758) Pulse Rate: 106 (08/15 0758) SPO2; 97% on 3 L via [place] FIO2:   Intake/Output Summary (Last 24 hours) at 07/27/14 1003 Last data filed at 07/27/14 4818  Gross per 24 hour  Intake   3003 ml  Output   1002 ml  Net   2001 ml     Exam: General: A./O. x4, NAD, mild-moderate respiratory distress Lungs: Decreased lung sounds diffusely, mild diffuse wheezes  Cardiovascular: Tachycardic, Regular rhythm without murmur gallop or rub normal S1 and S2 Abdomen: Nontender, nondistended, soft, bowel sounds positive, no rebound, no ascites, no appreciable mass Extremities: No significant cyanosis, clubbing. Bilateral 1-2+ pitting edema to knees   Data Reviewed: Basic Metabolic Panel:  Recent Labs Lab 07/25/14 1204 07/26/14 0113 07/26/14 1202 07/27/14 0315  NA 141 135* 141 140  K 3.9 4.3 3.9 4.2  CL 101 99 105 105  CO2 27 22 22 25   GLUCOSE 114* 443* 120* 67*  BUN 19 26* 30* 28*  CREATININE 0.83 1.07 0.88 0.82  CALCIUM 8.5 7.9* 8.8 8.5   Liver Function Tests:  Recent Labs Lab 07/26/14 0113  AST 19  ALT 11  ALKPHOS 68  BILITOT 0.6  PROT 5.8*  ALBUMIN 2.4*   No results found for this basename: LIPASE, AMYLASE,  in the last 168 hours No results found for this basename: AMMONIA,  in the  last 168 hours CBC:  Recent Labs Lab 07/25/14 1204 07/26/14 0113 07/27/14 0315  WBC 9.5 16.3* 13.1*  NEUTROABS 8.3*  --   --   HGB 11.6* 10.0* 11.1*  HCT 36.8* 33.0* 35.0*  MCV 97.1 97.9 98.0  PLT 264 253 252   Cardiac Enzymes:  Recent Labs Lab 07/25/14 2115 07/26/14 0113 07/26/14 0737  TROPONINI <0.30 <0.30 <0.30   BNP (last 3 results)  Recent Labs  07/25/14 1209  PROBNP 1647.0*   CBG:  Recent Labs Lab 07/26/14 1759 07/26/14 1850 07/26/14 2301 07/27/14 0756 07/27/14 0916  GLUCAP 65* 86 89 63* 104*    Recent Results (from the past 240 hour(s))  CULTURE, BLOOD (ROUTINE X 2)     Status: None   Collection Time    07/25/14  1:40 PM      Result Value Ref Range Status   Specimen Description BLOOD ARM RIGHT   Final   Special Requests BOTTLES DRAWN AEROBIC AND ANAEROBIC 10CC   Final   Culture  Setup Time     Final   Value: 07/25/2014 17:26     Performed at [facility]   Culture     Final   Value: GRAM NEGATIVE RODS     Note: Gram Stain Report Called to,Read Back By and Verified With: CAITLYN REID RN [phone]     Performed at [facility]   Report Status PENDING   Incomplete  CULTURE, BLOOD (ROUTINE X 2)     Status: None   Collection Time    07/25/14  1:50 PM      Result Value Ref Range Status   Specimen Description BLOOD HAND RIGHT   Final   Special Requests BOTTLES DRAWN AEROBIC AND ANAEROBIC 10CC   Final   Culture  Setup Time     Final   Value: 07/25/2014 17:26     Performed at [facility]   Culture     Final   Value: GRAM NEGATIVE RODS     Note: Gram Stain Report Called to,Read Back By and Verified With: Gus Height RN [phone]     Performed at [facility]   Report Status PENDING   Incomplete  MRSA PCR SCREENING     Status: None   Collection Time    07/25/14  7:10 PM      Result Value Ref Range Status   MRSA by PCR NEGATIVE  NEGATIVE Final   Comment:            The GeneXpert MRSA Assay (FDA     approved for NASAL  specimens     only), is one component of a     comprehensive MRSA colonization     surveillance program. It is not     intended to diagnose MRSA     infection nor to guide or     monitor treatment for     MRSA infections.     Studies:  Recent x-ray studies have been reviewed in detail by the Attending Physician  Scheduled Meds:  Scheduled Meds: . aspirin EC  81 mg Oral Daily  . clopidogrel  75 mg Oral Daily  . enoxaparin (LOVENOX) injection  80 mg Subcutaneous Q24H  . gabapentin  300 mg Oral BID  . insulin aspart  10 Units Subcutaneous BID AC & HS  . insulin aspart  12 Units Subcutaneous QAC supper  . insulin aspart  6-10 Units Subcutaneous TID WC  . insulin aspart  6-10 Units Subcutaneous QHS  . levalbuterol  0.63 mg Nebulization TID  . metoprolol  25 mg Oral BID  . pioglitazone  45 mg Oral Daily  . piperacillin-tazobactam (ZOSYN)  IV  3.375 g Intravenous Q8H  . predniSONE  60 mg Oral Q breakfast  . simvastatin  20 mg Oral q1800  . sodium chloride  3 mL Intravenous Q12H  . vancomycin  1,500 mg Intravenous Q12H    Time spent on care of this patient: 40 mins   Allie Bossier , MD   [facility]  [phone] Pager - [phone]  On-Call/Text Page:      [email or web address]      password [email or web address]  If 7PM-7AM, please contact night-coverage [email or web address] Password [email or web address] 07/27/2014, 10:03 AM   LOS: 2 days

## 2014-07-28 DIAGNOSIS — J14 Pneumonia due to Hemophilus influenzae: Secondary | ICD-10-CM

## 2014-07-28 LAB — LIPID PANEL
Cholesterol: 121 mg/dL (ref 0–200)
HDL: 42 mg/dL (ref >39)
LDL Cholesterol: 69 mg/dL (ref 0–99)
Total CHOL/HDL Ratio: 2.9 RATIO
Triglycerides: 48 mg/dL (ref <150)
VLDL: 10 mg/dL (ref 0–40)

## 2014-07-28 LAB — CBC WITH DIFFERENTIAL/PLATELET
BASOS ABS: 0 10*3/uL (ref 0.0–0.1)
Basophils Relative: 0 % (ref 0–1)
Eosinophils Absolute: 0 10*3/uL (ref 0.0–0.7)
Eosinophils Relative: 0 % (ref 0–5)
HEMATOCRIT: 32.8 % — AB (ref 39.0–52.0)
Hemoglobin: 10.4 g/dL — ABNORMAL LOW (ref 13.0–17.0)
LYMPHS ABS: 0.5 10*3/uL — AB (ref 0.7–4.0)
Lymphocytes Relative: 3 % — ABNORMAL LOW (ref 12–46)
MCH: 30.2 pg (ref 26.0–34.0)
MCHC: 31.7 g/dL (ref 30.0–36.0)
MCV: 95.3 fL (ref 78.0–100.0)
Monocytes Absolute: 0.7 10*3/uL (ref 0.1–1.0)
Monocytes Relative: 5 % (ref 3–12)
NEUTROS ABS: 12.9 10*3/uL — AB (ref 1.7–7.7)
Neutrophils Relative %: 92 % — ABNORMAL HIGH (ref 43–77)
PLATELETS: 296 10*3/uL (ref 150–400)
RBC: 3.44 MIL/uL — ABNORMAL LOW (ref 4.22–5.81)
RDW: 15.8 % — AB (ref 11.5–15.5)
WBC: 14.1 10*3/uL — ABNORMAL HIGH (ref 4.0–10.5)

## 2014-07-28 LAB — GLUCOSE, CAPILLARY
GLUCOSE-CAPILLARY: 212 mg/dL — AB (ref 70–99)
GLUCOSE-CAPILLARY: 258 mg/dL — AB (ref 70–99)
GLUCOSE-CAPILLARY: 314 mg/dL — AB (ref 70–99)
GLUCOSE-CAPILLARY: 319 mg/dL — AB (ref 70–99)
GLUCOSE-CAPILLARY: 90 mg/dL (ref 70–99)
Glucose-Capillary: 101 mg/dL — ABNORMAL HIGH (ref 70–99)
Glucose-Capillary: 127 mg/dL — ABNORMAL HIGH (ref 70–99)
Glucose-Capillary: 147 mg/dL — ABNORMAL HIGH (ref 70–99)
Glucose-Capillary: 193 mg/dL — ABNORMAL HIGH (ref 70–99)
Glucose-Capillary: 249 mg/dL — ABNORMAL HIGH (ref 70–99)

## 2014-07-28 LAB — COMPREHENSIVE METABOLIC PANEL
ALBUMIN: 2.3 g/dL — AB (ref 3.5–5.2)
ALT: 14 U/L (ref 0–53)
ANION GAP: 9 (ref 5–15)
AST: 25 U/L (ref 0–37)
Alkaline Phosphatase: 58 U/L (ref 39–117)
BILIRUBIN TOTAL: 0.3 mg/dL (ref 0.3–1.2)
BUN: 19 mg/dL (ref 6–23)
CHLORIDE: 104 meq/L (ref 96–112)
CO2: 26 mEq/L (ref 19–32)
CREATININE: 0.75 mg/dL (ref 0.50–1.35)
Calcium: 8.8 mg/dL (ref 8.4–10.5)
GFR calc Af Amer: >90 mL/min (ref >90)
GFR, EST NON AFRICAN AMERICAN: 89 mL/min — AB (ref >90)
Glucose, Bld: 126 mg/dL — ABNORMAL HIGH (ref 70–99)
Potassium: 4.1 mEq/L (ref 3.7–5.3)
Sodium: 139 mEq/L (ref 137–147)
Total Protein: 6 g/dL (ref 6.0–8.3)

## 2014-07-28 LAB — MAGNESIUM: Magnesium: 2.1 mg/dL (ref 1.5–2.5)

## 2014-07-28 LAB — LACTIC ACID, PLASMA: Lactic Acid, Venous: 1.3 mmol/L (ref 0.5–2.2)

## 2014-07-28 NOTE — Progress Notes (Signed)
[facility] TEAM 1 - Stepdown/ICU TEAM Progress Note  Charles [name] DQQ:[id number] DOB: 10-21-1941 DOA: 07/25/2014 PCP: [name], MD  Admit HPI / Brief Narrative: 73 y.o. WM PMHx Paroxysmal atrial fibrillation, obstructive sleep apnea, Hx stroke,Hx CAD, DM Type 2 with neuropathy, and retinopathy, HTN, HLD.  Endorsed was in his usual state of health as of this past Sunday when he started developing minimal shortness of breath. He reported "cold like symptoms". Over the next 2-3 days his symptoms progressively worsened. He did not have any cough initially but started coughing 24 hours prior to presentation and developed fever up to 101.30F. On the morning of admisison he woke up to go to the bathroom and became very short of breath, even with minimal exertion. He normally uses a CPAP every night and despite the use of CPAP he did not feel any better. His wife gave him her oxygen and one of her inhalers without any relief. Since his symptoms were not improving they called his primary care physician, who recommended that they call 911. When EMS arrived at the house he was found to be in respiratory distress. He was found to be cold, clammy, diaphoretic. Saturations initially were in 72%. He was placed on a nonrebreather and subsequently, on BiPAP. Was given Nebulizer treatments, steroids, and, he was brought into the emergency department.  In the emergency department it was noted that his blood pressure had been dropping. He was also tachycardic. Patient denied any dizziness, or any chest pain, per se. He was experiencing pleuritic type chest discomfort with coughing.   HPI/Subjective: 8/16 A./O. x4, continues to have positive SOB/DOE (improved from 8/15). States Not on oxygen at home.  Assessment/Plan:  Sepsis/Acute respiratory failure with hypoxia;  -Patient positive for Haemophilus influenza which is one of the HACEK organisms which will require patient to obtain TEE when stable? Consult ID in  a.m. -8/16 Spoke with Dr. Scharlene Gloss (infectious disease), and he stated patient would not require TEE unless patient remained septic through proper antibiotic treatment. -Will DC vancomycin (no effect on Haemophilus influenza) -Continue Zosyn until susceptibilities return -After patient begins to defervesce would obtain another set of blood cultures and if clean place PICC line to complete course of IV antibiotics, minimum 2 weeks.  CAP -See sepsis/acute respiratory failure with hypoxia  -See COPD exacerbation  COPD exacerbation  -cont current anbx's  -Continue flutter valve  -Continue Prednisone 60 mg daily -cont nebs- Xopenex PRN -DuoNeb QID -Continue O2 to maintain SpO2 > 93%    Septic shock/Hypotension  -Resolved, but BP still low for a patient of this age and with sepsis.   -cont IVFs at 125 but monitor for overload  -8/16 lactic acid = 1.3 down from high of 4.7   HYPERTENSION  -Continue metoprolol 25 mg BID; continued hold other hypertensive medication.   -Patient continues to be mildly hypotensive continue normal saline at 125 ml/hr.  PAF (paroxysmal atrial fibrillation)  -Cards signed off.   -Currently in Sinus tachycardia   -Continue metoprolol 25 mg BID -Spoke at length with patient and wife (who is in Therapist, sports) concerning the indications for NOAC in this high risk patient. Both stated they understand and are willing to take the risk of CVA/MI, and have declined to start NOAC  -Continue with Plavix 75 mg daily -Continue with aspirin 81 mg daily  MYOCARDIAL INFARCTION, HX OF/CORONARY ARTERY DISEASE  -per Cards  -Echocardiogram shows a mild left atrial dilation and mild left ventricle hypertrophy.  Type 2 diabetes, uncontrolled, with retinopathy and neuropathy  -8/15  AG =10 -Continue patient's customized insulin regimen except for;  Lunch NovoLog 12 units  Resistant SSI - CBG improved continue hold Actos and Amaryl secondary to multiple episodes of  hypoglycemia -Wife and patient verified that patient had been tried on numerous standard diabetic regimens, and the regimen he was admitted on was the only one which kept his hemoglobin A1c close to being within ADA guidelines.  History of CVA (cerebrovascular accident) -Spoke at length to patient and wife concerning the efficacy of NOAC over current regimen given his risk factors. Patient and wife (who is an Therapist, sports) Stated understands the risk and will declines at this time.  HLD   -Continue Zocor 40 mg daily  -lipid panel ; within ADA guidelines     Code Status: FULL Family Communication: Wife present at time of exam Disposition Plan: Resolution sepsis/bacteremia    Consultants: Dr. Doren Custard Nahser(cardiology)   Procedure/Significant Events: 8/14 echocardiogram Left ventricle: mild concentric hypertrophy. LVEF=55% to 60%. -Ascending aorta: ascending aorta was mildly dilated. - Left atrium: mildly dilated.    Culture 8/13 blood right arm/hand positive for (GNR) HAEMOPHILUS INFLUENZAE 8/13 MRSA PCR negative  8/14 urine negative   Antibiotics: Azithromycin 8/13 >>> stopped 8/14  Ceftriaxone 8/13 >>> stopped 8/14  Levofloxacin x1 dose  Vancomycin 8./13>> stopped 8/16 Zosyn 8/14>>   DVT prophylaxis: Lovenox   Devices NA   LINES / TUBES:  8/13 20ga left hand 8/13 20ga left antecubital    Continuous Infusions: . sodium chloride 1,000 mL (07/26/14 0736)    Objective: VITAL SIGNS: Temp: 98.1 F (36.7 C) (08/16 0800) Temp src: Oral (08/16 0800) BP: 113/68 mmHg (08/16 0436) Pulse Rate: 87 (08/16 0436) SPO2; 97% on 3 L via [place] FIO2:   Intake/Output Summary (Last 24 hours) at 07/28/14 0849 Last data filed at 07/28/14 0436  Gross per 24 hour  Intake   2933 ml  Output   1750 ml  Net   1183 ml     Exam: General: A./O. x4, NAD, mild respiratory distress Lungs: Clear to auscultation bilaterally, LUL mild expiratory wheezing   Cardiovascular: Tachycardic,  Regular rhythm without murmur gallop or rub normal S1 and S2 Abdomen: Nontender, nondistended, soft, bowel sounds positive, no rebound, no ascites, no appreciable mass Extremities: No significant cyanosis, clubbing. Bilateral 1-2+ pitting edema to knees   Data Reviewed: Basic Metabolic Panel:  Recent Labs Lab 07/25/14 1204 07/26/14 0113 07/26/14 1202 07/27/14 0315 07/28/14 0313  NA 141 135* 141 140 139  K 3.9 4.3 3.9 4.2 4.1  CL 101 99 105 105 104  CO2 27 22 22 25 26   GLUCOSE 114* 443* 120* 67* 126*  BUN 19 26* 30* 28* 19  CREATININE 0.83 1.07 0.88 0.82 0.75  CALCIUM 8.5 7.9* 8.8 8.5 8.8  MG  --   --   --   --  2.1   Liver Function Tests:  Recent Labs Lab 07/26/14 0113 07/28/14 0313  AST 19 25  ALT 11 14  ALKPHOS 68 58  BILITOT 0.6 0.3  PROT 5.8* 6.0  ALBUMIN 2.4* 2.3*   No results found for this basename: LIPASE, AMYLASE,  in the last 168 hours No results found for this basename: AMMONIA,  in the last 168 hours CBC:  Recent Labs Lab 07/25/14 1204 07/26/14 0113 07/27/14 0315 07/28/14 0313  WBC 9.5 16.3* 13.1* 14.1*  NEUTROABS 8.3*  --   --  12.9*  HGB 11.6* 10.0* 11.1* 10.4*  HCT 36.8* 33.0* 35.0* 32.8*  MCV 97.1 97.9 98.0 95.3  PLT 264 253 252 296   Cardiac Enzymes:  Recent Labs Lab 07/25/14 2115 07/26/14 0113 07/26/14 0737  TROPONINI <0.30 <0.30 <0.30   BNP (last 3 results)  Recent Labs  07/25/14 1209  PROBNP 1647.0*   CBG:  Recent Labs Lab 07/27/14 2017 07/27/14 2218 07/28/14 0021 07/28/14 0434 07/28/14 0748  GLUCAP 292* 260* 193* 90 127*    Recent Results (from the past 240 hour(s))  CULTURE, BLOOD (ROUTINE X 2)     Status: None   Collection Time    07/25/14  1:40 PM      Result Value Ref Range Status   Specimen Description BLOOD ARM RIGHT   Final   Special Requests BOTTLES DRAWN AEROBIC AND ANAEROBIC 10CC   Final   Culture  Setup Time     Final   Value: 07/25/2014 17:26     Performed at [facility]   Culture      Final   Value: HAEMOPHILUS INFLUENZAE     Note: BETA LACTAMASE NEGATIVE     Note: Gram Stain Report Called to,Read Back By and Verified With: Gus Height RN [phone]     Performed at [facility]   Report Status 07/27/2014 FINAL   Final  CULTURE, BLOOD (ROUTINE X 2)     Status: None   Collection Time    07/25/14  1:50 PM      Result Value Ref Range Status   Specimen Description BLOOD HAND RIGHT   Final   Special Requests BOTTLES DRAWN AEROBIC AND ANAEROBIC 10CC   Final   Culture  Setup Time     Final   Value: 07/25/2014 17:26     Performed at [facility]   Culture     Final   Value: HAEMOPHILUS INFLUENZAE     Note: BETA LACTAMASE NEGATIVE     Note: Gram Stain Report Called to,Read Back By and Verified With: Gus Height RN [phone]     Performed at [facility]   Report Status 07/27/2014 FINAL   Final  MRSA PCR SCREENING     Status: None   Collection Time    07/25/14  7:10 PM      Result Value Ref Range Status   MRSA by PCR NEGATIVE  NEGATIVE Final   Comment:            The GeneXpert MRSA Assay (FDA     approved for NASAL specimens     only), is one component of a     comprehensive MRSA colonization     surveillance program. It is not     intended to diagnose MRSA     infection nor to guide or     monitor treatment for     MRSA infections.  URINE CULTURE     Status: None   Collection Time    07/26/14  3:00 PM      Result Value Ref Range Status   Specimen Description URINE, CLEAN CATCH   Final   Special Requests NONE   Final   Culture  Setup Time     Final   Value: 07/26/2014 18:59     Performed at [place]     Final   Value: 1,000 COLONIES/ML     Performed at [facility]   Culture     Final   Value: INSIGNIFICANT GROWTH  Performed at [facility]   Report Status 07/27/2014 FINAL   Final     Studies:  Recent x-ray studies have been reviewed in detail by the Attending Physician  Scheduled  Meds:  Scheduled Meds: . aspirin EC  81 mg Oral Daily  . clopidogrel  75 mg Oral Daily  . enoxaparin (LOVENOX) injection  80 mg Subcutaneous Q24H  . gabapentin  300 mg Oral BID  . insulin aspart  0-20 Units Subcutaneous 6 times per day  . insulin aspart  10 Units Subcutaneous BID AC & HS  . insulin aspart  12 Units Subcutaneous QAC supper  . insulin aspart  12 Units Subcutaneous QAC lunch  . ipratropium-albuterol  3 mL Nebulization QID  . metoprolol  25 mg Oral BID  . piperacillin-tazobactam (ZOSYN)  IV  3.375 g Intravenous Q8H  . predniSONE  60 mg Oral Q breakfast  . simvastatin  20 mg Oral q1800  . sodium chloride  3 mL Intravenous Q12H  . vancomycin  1,500 mg Intravenous Q12H    Time spent on care of this patient: 40 mins   Allie Bossier , MD   [facility] Office  [phone] Pager - [phone]  On-Call/Text Page:      [email or web address]      password [email or web address]  If 7PM-7AM, please contact night-coverage [email or web address] Password [email or web address] 07/28/2014, 8:49 AM   LOS: 3 days

## 2014-07-29 LAB — COMPREHENSIVE METABOLIC PANEL
ALT: 13 U/L (ref 0–53)
ANION GAP: 9 (ref 5–15)
AST: 17 U/L (ref 0–37)
Albumin: 2.3 g/dL — ABNORMAL LOW (ref 3.5–5.2)
Alkaline Phosphatase: 61 U/L (ref 39–117)
BUN: 18 mg/dL (ref 6–23)
CALCIUM: 8.7 mg/dL (ref 8.4–10.5)
CO2: 27 mEq/L (ref 19–32)
CREATININE: 0.77 mg/dL (ref 0.50–1.35)
Chloride: 102 mEq/L (ref 96–112)
GFR calc non Af Amer: 89 mL/min — ABNORMAL LOW (ref >90)
GLUCOSE: 304 mg/dL — AB (ref 70–99)
Potassium: 4.1 mEq/L (ref 3.7–5.3)
Sodium: 138 mEq/L (ref 137–147)
TOTAL PROTEIN: 5.8 g/dL — AB (ref 6.0–8.3)
Total Bilirubin: 0.2 mg/dL — ABNORMAL LOW (ref 0.3–1.2)

## 2014-07-29 LAB — CBC WITH DIFFERENTIAL/PLATELET
BASOS PCT: 0 % (ref 0–1)
Basophils Absolute: 0 10*3/uL (ref 0.0–0.1)
Eosinophils Absolute: 0 10*3/uL (ref 0.0–0.7)
Eosinophils Relative: 0 % (ref 0–5)
HCT: 32.8 % — ABNORMAL LOW (ref 39.0–52.0)
HEMOGLOBIN: 10.3 g/dL — AB (ref 13.0–17.0)
Lymphocytes Relative: 4 % — ABNORMAL LOW (ref 12–46)
Lymphs Abs: 0.6 10*3/uL — ABNORMAL LOW (ref 0.7–4.0)
MCH: 30.1 pg (ref 26.0–34.0)
MCHC: 31.4 g/dL (ref 30.0–36.0)
MCV: 95.9 fL (ref 78.0–100.0)
Monocytes Absolute: 0.8 10*3/uL (ref 0.1–1.0)
Monocytes Relative: 5 % (ref 3–12)
NEUTROS ABS: 14.3 10*3/uL — AB (ref 1.7–7.7)
NEUTROS PCT: 91 % — AB (ref 43–77)
Platelets: 306 10*3/uL (ref 150–400)
RBC: 3.42 MIL/uL — ABNORMAL LOW (ref 4.22–5.81)
RDW: 15.9 % — ABNORMAL HIGH (ref 11.5–15.5)
WBC: 15.7 10*3/uL — ABNORMAL HIGH (ref 4.0–10.5)

## 2014-07-29 LAB — GLUCOSE, CAPILLARY
GLUCOSE-CAPILLARY: 202 mg/dL — AB (ref 70–99)
GLUCOSE-CAPILLARY: 207 mg/dL — AB (ref 70–99)
GLUCOSE-CAPILLARY: 208 mg/dL — AB (ref 70–99)
GLUCOSE-CAPILLARY: 247 mg/dL — AB (ref 70–99)
Glucose-Capillary: 266 mg/dL — ABNORMAL HIGH (ref 70–99)
Glucose-Capillary: 191 mg/dL — ABNORMAL HIGH (ref 70–99)
Glucose-Capillary: 71 mg/dL (ref 70–99)

## 2014-07-29 LAB — MAGNESIUM: Magnesium: 2 mg/dL (ref 1.5–2.5)

## 2014-07-29 LAB — LACTIC ACID, PLASMA: Lactic Acid, Venous: 1.1 mmol/L (ref 0.5–2.2)

## 2014-07-29 LAB — TSH: TSH: 1.68 u[IU]/mL (ref 0.350–4.500)

## 2014-07-29 LAB — T4, FREE: Free T4: 1.22 ng/dL (ref 0.80–1.80)

## 2014-07-29 MED ORDER — LEVALBUTEROL HCL 0.63 MG/3ML IN NEBU
0.6300 mg | INHALATION_SOLUTION | Freq: Four times a day (QID) | RESPIRATORY_TRACT | Status: DC
  Administered 2014-07-29 – 2014-07-30 (×3): 0.63 mg via RESPIRATORY_TRACT
  Filled 2014-07-29 (×7): qty 3

## 2014-07-29 MED ORDER — PREDNISONE 20 MG PO TABS
40.0000 mg | ORAL_TABLET | Freq: Every day | ORAL | Status: DC
  Administered 2014-07-30: 40 mg via ORAL
  Filled 2014-07-29 (×2): qty 2

## 2014-07-29 MED ORDER — METOPROLOL TARTRATE 25 MG PO TABS
25.0000 mg | ORAL_TABLET | Freq: Once | ORAL | Status: AC
  Administered 2014-07-29: 25 mg via ORAL
  Filled 2014-07-29: qty 1

## 2014-07-29 MED ORDER — METOPROLOL TARTRATE 1 MG/ML IV SOLN: 5.0000 mg | Freq: Once | INTRAVENOUS | Status: DC

## 2014-07-29 MED ORDER — METOPROLOL TARTRATE 1 MG/ML IV SOLN
2.5000 mg | INTRAVENOUS | Status: DC | PRN
Start: 2014-07-29 — End: 2014-08-06
  Administered 2014-07-29: 2.5 mg via INTRAVENOUS
  Filled 2014-07-29: qty 5

## 2014-07-29 MED ORDER — METOPROLOL TARTRATE 25 MG PO TABS
25.0000 mg | ORAL_TABLET | Freq: Four times a day (QID) | ORAL | Status: DC
  Administered 2014-07-29 – 2014-08-06 (×26): 25 mg via ORAL
  Filled 2014-07-29 (×36): qty 1

## 2014-07-29 MED ORDER — METOPROLOL TARTRATE 50 MG PO TABS: 50.0000 mg | ORAL_TABLET | Freq: Two times a day (BID) | ORAL | Status: DC

## 2014-07-29 MED ORDER — FUROSEMIDE 10 MG/ML IJ SOLN
40.0000 mg | Freq: Once | INTRAMUSCULAR | Status: AC
  Administered 2014-07-29: 40 mg via INTRAVENOUS
  Filled 2014-07-29: qty 4

## 2014-07-29 MED ORDER — SODIUM CHLORIDE 0.9 % IV SOLN
INTRAVENOUS | Status: DC
  Administered 2014-07-29: 16:00:00 via INTRAVENOUS

## 2014-07-29 NOTE — Progress Notes (Signed)
[place] TEAM 1 - Stepdown/ICU TEAM Progress Note  Charles [name] IRS:[id number] DOB: [birth year] DOA: 07/25/2014 PCP: [name], MD  Admit HPI / Brief Narrative: 73 y.o. M Hx Paroxysmal atrial fibrillation, obstructive sleep apnea, Hx stroke,Hx CAD, DM Type 2 with neuropathy, and retinopathy, HTN, HLD.  Endorsed was in his usual state of health when he started developing minimal shortness of breath. He reported "cold like symptoms". Over the next 2-3 days his symptoms progressively worsened. He did not have any cough initially but started coughing 24 hours prior to presentation and developed fever up to 101.83F. On the morning of admisison he woke up to go to the bathroom and became very short of breath, even with minimal exertion. He normally uses a CPAP every night and despite the use of CPAP he did not feel any better. His wife gave him her oxygen and one of her inhalers without any relief. Since his symptoms were not improving they called his primary care physician, who recommended that they call 911. When EMS arrived at the house he was found to be in respiratory distress. He was found to be cold, clammy, diaphoretic. Saturations initially were in 72%. He was placed on a nonrebreather and subsequently, on BiPAP. Was given Nebulizer treatments, steroids, and, he was brought into the emergency department.   In the emergency department it was noted that his blood pressure had been dropping. He was also tachycardic. Patient denied any dizziness, or any chest pain, per se. He was experiencing pleuritic type chest discomfort with coughing.  HPI/Subjective: Awake and sitting up in chair - no CP or resting SOB  Assessment/Plan:  Sepsis due to CAP and H Flu Bacteremia -cont empiric anbx's -BP soft in setting of RVR but o/w hemodynamically stable - Cortisol normal -peak lactic acid 4.7 but has normalized -PCT peaked at 2.76 -ID consulted and no indication for TEE for bacteremia -After  patient begins to defervesce would obtain another set of blood cultures and if clean place PICC line to complete course of IV antibiotics, minimum 2 weeks.  Acute respiratory failure with hypoxia due to Haemophilus influenza CAP and acute COPD exac -cont current anbx's  -Continue flutter valve  -Taper Prednisone to 40 mg daily -cont Xopenex PRN/DuoNeb QID -Continue O2 to maintain SpO2 > 93%   -cont Flutter valve  HYPERTENSION with concentric hypertrophy -Continue metoprolol; continued hold other hypertensive medication.   -Was hypotensive when VR 130-170 but SBP back up to 130s with controlled VR therefore will KVO fluids and give 1x dose IV Lasix and follow-RN reports pt voids easily but does have increasing LE edema  PAF (paroxysmal atrial fibrillation)  -Cards had signed off due to rhythm stability but returned this am due to RVR -Continue metoprolol but increase to q 6 hours- prn IV Lopressor until rate better controlled -Attending MD spoke at length w/pt and wife (8/16)- pt previously refused Coumadin and has done so again this admission- had rash with Xarelto -Plavix and ASA as prior to admit -ck TSH  MYOCARDIAL INFARCTION, HX OF/CORONARY ARTERY DISEASE  -per Cards  -Echocardiogram shows a mild left atrial dilation and mild left ventricle hypertrophy.    Type 2 diabetes, uncontrolled, with retinopathy and neuropathy  -Continue patient's customized insulin regimen except for:  Lunch NovoLog 12 units  Resistant SSI - CBG improved continue hold Actos and Amaryl secondary to multiple episodes of hypoglycemia-now having peaks to 320 but early am low of 90-hopefully will improve after Prednisone tapered and dc'd -Wife  and patient verified that patient had been tried on numerous standard diabetic regimens, and the regimen he was admitted on was the only one which kept his hemoglobin A1c close to being within ADA guidelines.  History of CVA (cerebrovascular accident) -Attending MD  Sherral Hammers spoke at length to patient and wife on 8/16 concerning the efficacy of NOAC over current regimen given his risk factors. Patient and wife (who is an Therapist, sports) stated understands the risk and decline at this time.  HLD   -Continue Zocor 40 mg daily  -lipid panel this admit within ADA guidelines  Code Status: FULL Family Communication: Wife present at time of exam Disposition Plan: SDU until RVR resolved and sepsis physiology resolved  Consultants: Dr. Doren Custard Nahser(cardiology)  Dr. Scharlene Gloss (Infectious disease)  Procedure/Significant Events: 8/14 echocardiogram Left ventricle: mild concentric hypertrophy. LVEF=55% to 60%. -Ascending aorta: ascending aorta was mildly dilated. - Left atrium: mildly dilated.  Culture 8/13 blood right arm/hand positive for (GNR) HAEMOPHILUS INFLUENZAE 8/13 MRSA PCR negative  8/14 urine negative  Antibiotics: Azithromycin 8/13 >>> stopped 8/14  Ceftriaxone 8/13 >>> stopped 8/14  Levofloxacin x1 dose  Vancomycin 8./13>> stopped 8/16 Zosyn 8/14>>  DVT prophylaxis: Lovenox  Objective: VITAL SIGNS: Temp: 97.6 F (36.4 C) (08/17 0802) Temp src: Oral (08/17 0802) BP: 134/61 mmHg (08/17 1100) Pulse Rate: 92 (08/17 1100) SPO2; 97% on 3 L via [place] FIO2:   Intake/Output Summary (Last 24 hours) at 07/29/14 1217 Last data filed at 07/29/14 0800  Gross per 24 hour  Intake   2475 ml  Output    550 ml  Net   1925 ml   Exam: General: No resting respiratory distress Lungs: fine crackles th/o all fields - no wheeze  Cardiovascular: regular rate without murmur gallop or rub normal S1 and S2 Abdomen: Nontender, nondistended, soft, bowel sounds positive, no rebound, no ascites, no appreciable mass Extremities: No significant cyanosis, clubbing. Bilateral 2+ pitting edema to knees which is baseline  Data Reviewed: Basic Metabolic Panel:  Recent Labs Lab 07/26/14 0113 07/26/14 1202 07/27/14 0315 07/28/14 0313 07/29/14 0322  NA 135* 141 140  139 138  K 4.3 3.9 4.2 4.1 4.1  CL 99 105 105 104 102  CO2 22 22 25 26 27   GLUCOSE 443* 120* 67* 126* 304*  BUN 26* 30* 28* 19 18  CREATININE 1.07 0.88 0.82 0.75 0.77  CALCIUM 7.9* 8.8 8.5 8.8 8.7  MG  --   --   --  2.1 2.0   Liver Function Tests:  Recent Labs Lab 07/26/14 0113 07/28/14 0313 07/29/14 0322  AST 19 25 17   ALT 11 14 13   ALKPHOS 68 58 61  BILITOT 0.6 0.3 0.2*  PROT 5.8* 6.0 5.8*  ALBUMIN 2.4* 2.3* 2.3*   CBC:  Recent Labs Lab 07/25/14 1204 07/26/14 0113 07/27/14 0315 07/28/14 0313 07/29/14 0322  WBC 9.5 16.3* 13.1* 14.1* 15.7*  NEUTROABS 8.3*  --   --  12.9* 14.3*  HGB 11.6* 10.0* 11.1* 10.4* 10.3*  HCT 36.8* 33.0* 35.0* 32.8* 32.8*  MCV 97.1 97.9 98.0 95.3 95.9  PLT 264 253 252 296 306   Cardiac Enzymes:  Recent Labs Lab 07/25/14 2115 07/26/14 0113 07/26/14 0737  TROPONINI <0.30 <0.30 <0.30   BNP (last 3 results)  Recent Labs  07/25/14 1209  PROBNP 1647.0*   CBG:  Recent Labs Lab 07/28/14 1844 07/28/14 2212 07/28/14 2342 07/29/14 0415 07/29/14 0806  GLUCAP 258* 319* 314* 266* 191*    Recent Results (from the past  240 hour(s))  CULTURE, BLOOD (ROUTINE X 2)     Status: None   Collection Time    07/25/14  1:40 PM      Result Value Ref Range Status   Specimen Description BLOOD ARM RIGHT   Final   Special Requests BOTTLES DRAWN AEROBIC AND ANAEROBIC 10CC   Final   Culture  Setup Time     Final   Value: 07/25/2014 17:26     Performed at [facility]   Culture     Final   Value: HAEMOPHILUS INFLUENZAE     Note: BETA LACTAMASE NEGATIVE     Note: Gram Stain Report Called to,Read Back By and Verified With: Gus Height RN [phone]     Performed at [facility]   Report Status 07/27/2014 FINAL   Final  CULTURE, BLOOD (ROUTINE X 2)     Status: None   Collection Time    07/25/14  1:50 PM      Result Value Ref Range Status   Specimen Description BLOOD HAND RIGHT   Final   Special Requests BOTTLES DRAWN AEROBIC AND  ANAEROBIC 10CC   Final   Culture  Setup Time     Final   Value: 07/25/2014 17:26     Performed at [facility]   Culture     Final   Value: HAEMOPHILUS INFLUENZAE     Note: BETA LACTAMASE NEGATIVE     Note: Gram Stain Report Called to,Read Back By and Verified With: Gus Height RN [phone]     Performed at [facility]   Report Status 07/27/2014 FINAL   Final  MRSA PCR SCREENING     Status: None   Collection Time    07/25/14  7:10 PM      Result Value Ref Range Status   MRSA by PCR NEGATIVE  NEGATIVE Final   Comment:            The GeneXpert MRSA Assay (FDA     approved for NASAL specimens     only), is one component of a     comprehensive MRSA colonization     surveillance program. It is not     intended to diagnose MRSA     infection nor to guide or     monitor treatment for     MRSA infections.  URINE CULTURE     Status: None   Collection Time    07/26/14  3:00 PM      Result Value Ref Range Status   Specimen Description URINE, CLEAN CATCH   Final   Special Requests NONE   Final   Culture  Setup Time     Final   Value: 07/26/2014 18:59     Performed at [place]     Final   Value: 1,000 COLONIES/ML     Performed at [facility]   Culture     Final   Value: INSIGNIFICANT GROWTH     Performed at [facility]   Report Status 07/27/2014 FINAL   Final     Studies:  Recent x-ray studies have been reviewed in detail by the Attending Physician  Scheduled Meds:  Scheduled Meds: . aspirin EC  81 mg Oral Daily  . clopidogrel  75 mg Oral Daily  . enoxaparin (LOVENOX) injection  80 mg Subcutaneous Q24H  . gabapentin  300 mg Oral BID  . insulin aspart  0-20 Units Subcutaneous 6 times per  day  . insulin aspart  10 Units Subcutaneous BID AC & HS  . insulin aspart  12 Units Subcutaneous QAC supper  . insulin aspart  12 Units Subcutaneous QAC lunch  . ipratropium-albuterol  3 mL Nebulization QID  . metoprolol  25 mg  Oral Q6H  . piperacillin-tazobactam (ZOSYN)  IV  3.375 g Intravenous Q8H  . [START ON 07/30/2014] predniSONE  40 mg Oral Q breakfast  . simvastatin  20 mg Oral q1800  . sodium chloride  3 mL Intravenous Q12H    Time spent on care of this patient: 35 mins   ELLIS,ALLISON L. , ANP   [facility] Office  [phone] Pager - [phone]  On-Call/Text Page:      [email or web address]      password [email or web address]  If 7PM-7AM, please contact night-coverage [email or web address] Password [email or web address] 07/29/2014, 12:17 PM   LOS: 4 days   I have personally examined this patient and reviewed the entire database. I have reviewed the above note, made any necessary editorial changes, and agree with its content.  Cherene Altes, MD [facility]

## 2014-07-29 NOTE — Progress Notes (Signed)
Patient: Charles [name] / Admit Date: 07/25/2014 / Date of Encounter: 07/29/2014, 10:35 AM   Subjective: 73 year old male with history of CAD (MI 2000), PAF, DM with peripheral neuropathy, OSA on CPAP, HTN, CVA (2002), and morbid obesity (BMI 42.62) who was brought to [facility] via EMS 2/2  hypoxic respiratory failure, fever, chills and felt to have CAP. CXR on 8/13 revealed bilateral interstitial and alveolar opacities, right greater than left, which may be reflective of pulmonary edema vs multilobular PNA. Echo on 8/14 revealed EF 55-60% mild concentric hypertrophy, mildly dilated left atrium, unable to assess for WMA. He was started on Zosyn. He was not felt to be in heart failure, thus we signed off on 8/14. He has been quite resistant to the idea of anticoagulation, but has on aspirin and Plavix.   Developed a-fib with RVR +/- SVT vs a-flutter overnight. On metoprolol tartrate 25 mg PO bid at baseline. Received an extra metoprolol 25 mg PO at 11 AM as well as metoprolol 2.5 mg IV. States sitting in the bed with his legs slightly elevated and back elevated caused the a-fib. Blames all external sources for all of his a-fib episodes. No chest pain, palpitations, SOB, or dyspnea.   Objective: Telemetry: paroxysms of atrial fib RVR with additional regular narrow complex tachycardia, rates 150s-190s, interspersed with NSR with PVCs and PACs  Physical Exam: Blood pressure 112/60, pulse 99, temperature 97.6 F (36.4 C), temperature source Oral, resp. rate 24, height 6\' 4"  (1.93 m), weight 386 lb 7.5 oz (175.3 kg), SpO2 95.00%. General: Well developed, well nourished, in no acute distress.Hard of hearing.  Head: Normocephalic, atraumatic, sclera non-icteric, no xanthomas, nares are without discharge. Neck: Negative for carotid bruits. JVP not elevated. Lungs: Clear bilaterally to auscultation without wheezes, rales, or rhonchi. Breathing is unlabored. Heart: RRR. S1 S2 without murmurs, rubs, or gallops.    Abdomen: Soft, non-tender, non-distended with normoactive bowel sounds. No rebound/guarding. Extremities: No clubbing or cyanosis. Bilateral trace edema to the knees. Distal pedal pulses are 2+ and equal bilaterally. Neuro: Alert and oriented X 3. Moves all extremities spontaneously. Psych:  Responds to questions appropriately with a normal affect.   Intake/Output Summary (Last 24 hours) at 07/29/14 1035 Last data filed at 07/29/14 0800  Gross per 24 hour  Intake   2725 ml  Output    550 ml  Net   2175 ml    Inpatient Medications:  . aspirin EC  81 mg Oral Daily  . clopidogrel  75 mg Oral Daily  . enoxaparin (LOVENOX) injection  80 mg Subcutaneous Q24H  . gabapentin  300 mg Oral BID  . insulin aspart  0-20 Units Subcutaneous 6 times per day  . insulin aspart  10 Units Subcutaneous BID AC & HS  . insulin aspart  12 Units Subcutaneous QAC supper  . insulin aspart  12 Units Subcutaneous QAC lunch  . ipratropium-albuterol  3 mL Nebulization QID  . metoprolol tartrate  25 mg Oral Once  . metoprolol  25 mg Oral Q6H  . piperacillin-tazobactam (ZOSYN)  IV  3.375 g Intravenous Q8H  . [START ON 07/30/2014] predniSONE  40 mg Oral Q breakfast  . simvastatin  20 mg Oral q1800  . sodium chloride  3 mL Intravenous Q12H   Infusions:  . sodium chloride 1,000 mL (07/26/14 0736)    Labs:  Recent Labs  07/28/14 0313 07/29/14 0322  NA 139 138  K 4.1 4.1  CL 104 102  CO2  26 27  GLUCOSE 126* 304*  BUN 19 18  CREATININE 0.75 0.77  CALCIUM 8.8 8.7  MG 2.1 2.0    Recent Labs  07/28/14 0313 07/29/14 0322  AST 25 17  ALT 14 13  ALKPHOS 58 61  BILITOT 0.3 0.2*  PROT 6.0 5.8*  ALBUMIN 2.3* 2.3*    Recent Labs  07/28/14 0313 07/29/14 0322  WBC 14.1* 15.7*  NEUTROABS 12.9* 14.3*  HGB 10.4* 10.3*  HCT 32.8* 32.8*  MCV 95.3 95.9  PLT 296 306   No results found for this basename: CKTOTAL, CKMB, TROPONINI,  in the last 72 hours No components found with this basename: POCBNP,   No results found for this basename: HGBA1C,  in the last 72 hours   Radiology/Studies:  Dg Chest Port 1 View  07/25/2014   CLINICAL DATA:  Respiratory distress  EXAM: PORTABLE CHEST - 1 VIEW  COMPARISON:  05/25/2014  FINDINGS: There are bilateral interstitial and alveolar airspace opacities most severe in the right lower lung. There is prominence of the central pulmonary vasculature. There is no pneumothorax. There is no definite pleural effusion. The heart and mediastinum are stable. There is thoracic aortic atherosclerosis.  The osseous structures are unremarkable.  IMPRESSION: Bilateral interstitial and alveolar airspace opacities, right greater than left, which may reflect pulmonary edema versus multilobar pneumonia.   Electronically Signed   By: Kathreen Devoid   On: 07/25/2014 12:35     Assessment and Plan  73 year old male with history of CAD (MI 2000), PAF, DM with peripheral neuropathy, OSA on CPAP, HTN, CVA (2002), and morbid obesity (BMI 42.62) who was brought to [facility] via EMS 2/2 hypoxic respiratory failure, fever, chills and felt to have CAP. Noted to be tachycardic and hypotensive in the ED which improved with IV fluids. Received notification from primary team this morning that he has had intermittent tachycardia and we are asked to see him regarding this.     1) Atrial arrhythmias: PAF, also ? SVT vs atrial flutter -Appears that he has gone in and out of afib with RVR with possible additional SVT vs atrial flutter since around 3:30 AM the previous evening. Will ask MD to review. Now in NSR, rates 80s. Agree with increase of Lopressor 25 mg to q6h. He has just received Lopressor 25 mg PO and metoprolol 2.5 mg IV. BP stable at 112/60. Will monitor his response. He continues to decline anticoagulation and wishes to stay on his own therapy of Plavix and ASA. He is aware of the risks. States he is aware this may kill him, and understands he may be making a poor decision but he will not spend the  money for a NOAC. Continues to state he will not pay the lawyers. When questioned about Coumadin he does state he will not drive back-and-forth to get his INR checked. If we would like to start him on Coumadin would have to check into the possibility of [facility] checking his INR and level of patient compliance. Not sure of utility of antiarrhythmic if he does not wish to pursue anticoagulation.  -K+ 4.1, Mg 2.0, check TSH, free T4  2) CAP -Unlikely CHF, Troponins negative. Will continue daily weights (386 today).  -Has improved 2/2 to antibiotic therapy -There is trace bilateral pitting edema to the knees as he has not been receiving his Lasix 2/2 soft BP. This is about baseline for him.       3) Hypotension: -Resolved, has resumed b-blocker, not Lasix  -  Consider adding back Lasix later on today or tomorrow, just received extra doses of metoprolol this AM  4) CAD s/p stent 2000, NSTEMI 2013 treated medically -On aspirin, Plavix, b-blocker, statin,  -TC 121, LDL 69, HDL 42, trigs 48  5) Diabetes mellitus with retinopathy/neuropathy -Uncontrolled blood sugars with inpatient -SSI   Signed, Christell Faith, PA-C 07/29/2014 11:08 AM  I have examined the patient and reviewed assessment and plan and discussed with patient.  Agree with above as stated.  Better rate controlled now.  COntinue medical therapy.  Would benefit from anticoagulation but he has a reported h/o noncompliance with NOAC in the past.   Eloise Mula S.

## 2014-07-30 ENCOUNTER — Inpatient Hospital Stay (HOSPITAL_COMMUNITY): Payer: Medicare Other

## 2014-07-30 DIAGNOSIS — R319 Hematuria, unspecified: Secondary | ICD-10-CM

## 2014-07-30 DIAGNOSIS — E118 Type 2 diabetes mellitus with unspecified complications: Secondary | ICD-10-CM

## 2014-07-30 DIAGNOSIS — E1165 Type 2 diabetes mellitus with hyperglycemia: Secondary | ICD-10-CM

## 2014-07-30 DIAGNOSIS — IMO0002 Reserved for concepts with insufficient information to code with codable children: Secondary | ICD-10-CM

## 2014-07-30 DIAGNOSIS — R338 Other retention of urine: Secondary | ICD-10-CM

## 2014-07-30 LAB — CBC
HCT: 33 % — ABNORMAL LOW (ref 39.0–52.0)
Hemoglobin: 10.7 g/dL — ABNORMAL LOW (ref 13.0–17.0)
MCH: 30.7 pg (ref 26.0–34.0)
MCHC: 32.4 g/dL (ref 30.0–36.0)
MCV: 94.6 fL (ref 78.0–100.0)
Platelets: 310 10*3/uL (ref 150–400)
RBC: 3.49 MIL/uL — AB (ref 4.22–5.81)
RDW: 15.7 % — ABNORMAL HIGH (ref 11.5–15.5)
WBC: 13.5 10*3/uL — AB (ref 4.0–10.5)

## 2014-07-30 LAB — URINALYSIS, ROUTINE W REFLEX MICROSCOPIC
Glucose, UA: NEGATIVE mg/dL
KETONES UR: 15 mg/dL — AB
NITRITE: POSITIVE — AB
Protein, ur: 100 mg/dL — AB
Specific Gravity, Urine: 1.023 (ref 1.005–1.030)
UROBILINOGEN UA: 1 mg/dL (ref 0.0–1.0)
pH: 5 (ref 5.0–8.0)

## 2014-07-30 LAB — GLUCOSE, CAPILLARY
Glucose-Capillary: 120 mg/dL — ABNORMAL HIGH (ref 70–99)
Glucose-Capillary: 136 mg/dL — ABNORMAL HIGH (ref 70–99)
Glucose-Capillary: 142 mg/dL — ABNORMAL HIGH (ref 70–99)
Glucose-Capillary: 175 mg/dL — ABNORMAL HIGH (ref 70–99)
Glucose-Capillary: 180 mg/dL — ABNORMAL HIGH (ref 70–99)

## 2014-07-30 LAB — BASIC METABOLIC PANEL
Anion gap: 10 (ref 5–15)
BUN: 17 mg/dL (ref 6–23)
CHLORIDE: 101 meq/L (ref 96–112)
CO2: 30 meq/L (ref 19–32)
Calcium: 8.7 mg/dL (ref 8.4–10.5)
Creatinine, Ser: 0.76 mg/dL (ref 0.50–1.35)
GFR calc Af Amer: >90 mL/min (ref >90)
GFR calc non Af Amer: 89 mL/min — ABNORMAL LOW (ref >90)
GLUCOSE: 136 mg/dL — AB (ref 70–99)
POTASSIUM: 3.8 meq/L (ref 3.7–5.3)
SODIUM: 141 meq/L (ref 137–147)

## 2014-07-30 LAB — URINE MICROSCOPIC-ADD ON

## 2014-07-30 MED ORDER — MORPHINE SULFATE 2 MG/ML IJ SOLN
2.0000 mg | INTRAMUSCULAR | Status: DC | PRN
  Administered 2014-07-30 – 2014-08-02 (×3): 2 mg via INTRAVENOUS
  Filled 2014-07-30: qty 2
  Filled 2014-07-30 (×2): qty 1

## 2014-07-30 MED ORDER — MORPHINE SULFATE 2 MG/ML IJ SOLN
INTRAMUSCULAR | Status: AC
  Filled 2014-07-30: qty 1

## 2014-07-30 MED ORDER — LEVALBUTEROL HCL 0.63 MG/3ML IN NEBU
0.6300 mg | INHALATION_SOLUTION | Freq: Three times a day (TID) | RESPIRATORY_TRACT | Status: DC
  Administered 2014-07-30 – 2014-08-06 (×18): 0.63 mg via RESPIRATORY_TRACT
  Filled 2014-07-30 (×37): qty 3

## 2014-07-30 MED ORDER — PREDNISONE 20 MG PO TABS
30.0000 mg | ORAL_TABLET | Freq: Every day | ORAL | Status: DC
  Administered 2014-07-31: 30 mg via ORAL
  Filled 2014-07-30 (×2): qty 1

## 2014-07-30 MED ORDER — LORAZEPAM 0.5 MG PO TABS
0.5000 mg | ORAL_TABLET | Freq: Once | ORAL | Status: AC
  Administered 2014-07-30: 0.5 mg via ORAL
  Filled 2014-07-30: qty 1

## 2014-07-30 MED ORDER — LOSARTAN POTASSIUM 50 MG PO TABS
100.0000 mg | ORAL_TABLET | Freq: Every day | ORAL | Status: DC
  Administered 2014-07-30 – 2014-08-06 (×8): 100 mg via ORAL
  Filled 2014-07-30 (×9): qty 2

## 2014-07-30 MED ORDER — PHENAZOPYRIDINE HCL 200 MG PO TABS
200.0000 mg | ORAL_TABLET | Freq: Three times a day (TID) | ORAL | Status: DC
  Administered 2014-07-30 – 2014-08-02 (×9): 200 mg via ORAL
  Filled 2014-07-30 (×11): qty 1

## 2014-07-30 MED ORDER — OXYBUTYNIN CHLORIDE 5 MG PO TABS
2.5000 mg | ORAL_TABLET | Freq: Three times a day (TID) | ORAL | Status: DC | PRN
  Administered 2014-07-30: 2.5 mg via ORAL
  Filled 2014-07-30 (×2): qty 0.5

## 2014-07-30 NOTE — Consult Note (Signed)
Reason for Consult: Urinary Retention, Urethral Trauma  Referring Physician: Joette Catching MD  Charles [name] is an 73 y.o. male.   HPI:   1 - Urethral Trauma / Urinary Retention - pt admitted for severe pneumonia with bacteremia noted to have malposiioned cahtere (suspect balloon in urethra) with minimal output and non-flushing therefore removed last PM. Pt only void small amounts with some visible clots and increasing SP pain c/w retention. Urgent Urologic consult requested.  No prior GU eval. No baseline voiding complaints.   Past Medical History  Diagnosis Date  . CAD (coronary artery disease)     a. Approx. 2000 - MI. Cath showed single vessel disease, PTCA dLAD/medical management. ;  b. NSTEMI 11/13 => LHC: prox and mid LAD 30%, dLAD 60%, pCFX 30%, inf branch of OM 40%, mRCA 50-60%, EF 55-60%; IVUS attempted for RCA but not successful; anatomy felt stable from 2000 => med Rx.  . Diverticulitis   . Hypertension   . Morbid obesity   . CVA (cerebral infarction)     a. Approx. 2002  . Cholelithiasis   . Hypercholesterolemia   . ED (erectile dysfunction)   . OSA on CPAP   . Type II diabetes mellitus   . History of stomach ulcers   . History of MRSA infection     "little toe right foot" (10/26/2012)  . Tunnel vision     "both eyes since stroke"  . Claustrophobia   . B12 deficiency   . Diabetic neuropathy   . PAF (paroxysmal atrial fibrillation)     a. confirmed by event monitor. b. 02/2014 rash on Coumadin, patient decided to discontinue Xarelto due to possible rash, cost and lawyers ads on TV, agreed to take Plavix.    Past Surgical History  Procedure Laterality Date  . Toe amputation  2006; 2009    "Dr. Blenda Mounts; big toe left foot; little toe on right foot" (10/26/2012)  . Cardiac catheterization  1990's  . Cerebral angiogram  ~ 2000    Family History  Problem Relation Age of Onset  . Asthma Neg Hx   . Cancer Mother 63    breast  . Heart disease Father 74    heart     Social History:  reports that he has quit smoking. His smoking use included Cigarettes and Cigars. He has a 30 pack-year smoking history. His smokeless tobacco use includes Snuff. He reports that he drinks alcohol. He reports that he does not use illicit drugs.  Allergies:  Allergies  Allergen Reactions  . Xarelto [Rivaroxaban]     rash    Medications: I have reviewed the patient's current medications.  Results for orders placed during the hospital encounter of 07/25/14 (from the past 48 hour(s))  GLUCOSE, CAPILLARY     Status: Abnormal   Collection Time    07/28/14  6:44 PM      Result Value Ref Range   Glucose-Capillary 258 (*) 70 - 99 mg/dL  GLUCOSE, CAPILLARY     Status: Abnormal   Collection Time    07/28/14 10:12 PM      Result Value Ref Range   Glucose-Capillary 319 (*) 70 - 99 mg/dL  GLUCOSE, CAPILLARY     Status: Abnormal   Collection Time    07/28/14 11:42 PM      Result Value Ref Range   Glucose-Capillary 314 (*) 70 - 99 mg/dL  LACTIC ACID, PLASMA     Status: None   Collection Time    07/29/14  3:22 AM      Result Value Ref Range   Lactic Acid, Venous 1.1  0.5 - 2.2 mmol/L  COMPREHENSIVE METABOLIC PANEL     Status: Abnormal   Collection Time    07/29/14  3:22 AM      Result Value Ref Range   Sodium 138  137 - 147 mEq/L   Potassium 4.1  3.7 - 5.3 mEq/L   Chloride 102  96 - 112 mEq/L   CO2 27  19 - 32 mEq/L   Glucose, Bld 304 (*) 70 - 99 mg/dL   BUN 18  6 - 23 mg/dL   Creatinine, Ser 0.77  0.50 - 1.35 mg/dL   Calcium 8.7  8.4 - 10.5 mg/dL   Total Protein 5.8 (*) 6.0 - 8.3 g/dL   Albumin 2.3 (*) 3.5 - 5.2 g/dL   AST 17  0 - 37 U/L   ALT 13  0 - 53 U/L   Alkaline Phosphatase 61  39 - 117 U/L   Total Bilirubin 0.2 (*) 0.3 - 1.2 mg/dL   GFR calc non Af Amer 89 (*) >90 mL/min   GFR calc Af Amer >90  >90 mL/min   Comment: (NOTE)     The eGFR has been calculated using the CKD EPI equation.     This calculation has not been validated in all clinical  situations.     eGFR's persistently <90 mL/min signify possible Chronic Kidney     Disease.   Anion gap 9  5 - 15  CBC WITH DIFFERENTIAL     Status: Abnormal   Collection Time    07/29/14  3:22 AM      Result Value Ref Range   WBC 15.7 (*) 4.0 - 10.5 K/uL   RBC 3.42 (*) 4.22 - 5.81 MIL/uL   Hemoglobin 10.3 (*) 13.0 - 17.0 g/dL   HCT 32.8 (*) 39.0 - 52.0 %   MCV 95.9  78.0 - 100.0 fL   MCH 30.1  26.0 - 34.0 pg   MCHC 31.4  30.0 - 36.0 g/dL   RDW 15.9 (*) 11.5 - 15.5 %   Platelets 306  150 - 400 K/uL   Neutrophils Relative % 91 (*) 43 - 77 %   Neutro Abs 14.3 (*) 1.7 - 7.7 K/uL   Lymphocytes Relative 4 (*) 12 - 46 %   Lymphs Abs 0.6 (*) 0.7 - 4.0 K/uL   Monocytes Relative 5  3 - 12 %   Monocytes Absolute 0.8  0.1 - 1.0 K/uL   Eosinophils Relative 0  0 - 5 %   Eosinophils Absolute 0.0  0.0 - 0.7 K/uL   Basophils Relative 0  0 - 1 %   Basophils Absolute 0.0  0.0 - 0.1 K/uL  MAGNESIUM     Status: None   Collection Time    07/29/14  3:22 AM      Result Value Ref Range   Magnesium 2.0  1.5 - 2.5 mg/dL  GLUCOSE, CAPILLARY     Status: Abnormal   Collection Time    07/29/14  4:15 AM      Result Value Ref Range   Glucose-Capillary 266 (*) 70 - 99 mg/dL  GLUCOSE, CAPILLARY     Status: Abnormal   Collection Time    07/29/14  8:06 AM      Result Value Ref Range   Glucose-Capillary 191 (*) 70 - 99 mg/dL  GLUCOSE, CAPILLARY     Status: None   Collection  Time    07/29/14 11:47 AM      Result Value Ref Range   Glucose-Capillary 71  70 - 99 mg/dL  TSH     Status: None   Collection Time    07/29/14  1:26 PM      Result Value Ref Range   TSH 1.680  0.350 - 4.500 uIU/mL  T4, FREE     Status: None   Collection Time    07/29/14  1:26 PM      Result Value Ref Range   Free T4 1.22  0.80 - 1.80 ng/dL   Comment: Performed at [place], CAPILLARY     Status: Abnormal   Collection Time    07/29/14  3:41 PM      Result Value Ref Range   Glucose-Capillary 207 (*) 70  - 99 mg/dL  GLUCOSE, CAPILLARY     Status: Abnormal   Collection Time    07/29/14  4:50 PM      Result Value Ref Range   Glucose-Capillary 208 (*) 70 - 99 mg/dL  GLUCOSE, CAPILLARY     Status: Abnormal   Collection Time    07/29/14  8:33 PM      Result Value Ref Range   Glucose-Capillary 247 (*) 70 - 99 mg/dL  GLUCOSE, CAPILLARY     Status: Abnormal   Collection Time    07/29/14 11:52 PM      Result Value Ref Range   Glucose-Capillary 202 (*) 70 - 99 mg/dL  CBC     Status: Abnormal   Collection Time    07/30/14  3:00 AM      Result Value Ref Range   WBC 13.5 (*) 4.0 - 10.5 K/uL   RBC 3.49 (*) 4.22 - 5.81 MIL/uL   Hemoglobin 10.7 (*) 13.0 - 17.0 g/dL   HCT 33.0 (*) 39.0 - 52.0 %   MCV 94.6  78.0 - 100.0 fL   MCH 30.7  26.0 - 34.0 pg   MCHC 32.4  30.0 - 36.0 g/dL   RDW 15.7 (*) 11.5 - 15.5 %   Platelets 310  150 - 400 K/uL  BASIC METABOLIC PANEL     Status: Abnormal   Collection Time    07/30/14  3:00 AM      Result Value Ref Range   Sodium 141  137 - 147 mEq/L   Potassium 3.8  3.7 - 5.3 mEq/L   Chloride 101  96 - 112 mEq/L   CO2 30  19 - 32 mEq/L   Glucose, Bld 136 (*) 70 - 99 mg/dL   BUN 17  6 - 23 mg/dL   Creatinine, Ser 0.76  0.50 - 1.35 mg/dL   Calcium 8.7  8.4 - 10.5 mg/dL   GFR calc non Af Amer 89 (*) >90 mL/min   GFR calc Af Amer >90  >90 mL/min   Comment: (NOTE)     The eGFR has been calculated using the CKD EPI equation.     This calculation has not been validated in all clinical situations.     eGFR's persistently <90 mL/min signify possible Chronic Kidney     Disease.   Anion gap 10  5 - 15  GLUCOSE, CAPILLARY     Status: Abnormal   Collection Time    07/30/14  3:52 AM      Result Value Ref Range   Glucose-Capillary 120 (*) 70 - 99 mg/dL  GLUCOSE, CAPILLARY     Status: Abnormal  Collection Time    07/30/14  7:36 AM      Result Value Ref Range   Glucose-Capillary 136 (*) 70 - 99 mg/dL   Comment 1 Documented in Chart    GLUCOSE, CAPILLARY      Status: Abnormal   Collection Time    07/30/14 11:41 AM      Result Value Ref Range   Glucose-Capillary 142 (*) 70 - 99 mg/dL   Comment 1 Documented in Chart      US Renal  07/30/2014   CLINICAL DATA:  Urinary retention in this setting of recent Foley trauma  EXAM: RENAL/URINARY TRACT ULTRASOUND COMPLETE  COMPARISON:  None.  FINDINGS: Right Kidney:  Length: 13.7 cm. Echogenicity within normal limits. No mass or hydronephrosis visualized.  Left Kidney:  Length: 13.0 cm. Visualization of the left kidney was somewhat difficult due to the presence of bowel gas. Echogenicity within normal limits. No mass or hydronephrosis visualized.  Bladder:  The urinary bladder is mildly distended with a volume of 778 cc. The patient was unable to void spontaneously. An attempt at Foley catheterization reportedly produced blood clots. The prostate gland is mildly enlarged measuring 5.2 x 5.1 x 5.2 cm. It produces an impression upon the urinary bladder base.  IMPRESSION: 1. There is distention of the urinary bladder with debris present likely reflecting blood products. The patient was unable to spontaneously void. There is prostatic enlargement. 2. The kidneys exhibit no acute abnormalities.   Electronically Signed   By: David  Martinique   On: 07/30/2014 16:04    Review of Systems  Constitutional: Positive for malaise/fatigue. Negative for fever and chills.  HENT: Negative.   Eyes: Negative.   Respiratory: Negative.   Cardiovascular: Negative.   Gastrointestinal: Negative.   Genitourinary: Positive for urgency and frequency.       + severe SP pain  Musculoskeletal: Negative.   Skin: Negative.   Neurological: Negative.   Endo/Heme/Allergies: Bruises/bleeds easily.  Psychiatric/Behavioral: Negative.    Blood pressure 167/85, pulse 96, temperature 97.9 F (36.6 C), temperature source Oral, resp. rate 23, height '6\' 4"'  (1.93 m), weight 179.2 kg (395 lb 1 oz), SpO2 95.00%. Physical Exam  Constitutional: He appears  well-developed and well-nourished.  Obese in 2C unit at [facility], family at bedsdie, in visible discomfort  HENT:  Head: Normocephalic and atraumatic.  Eyes: EOM are normal. Pupils are equal, round, and reactive to light.  Neck: Normal range of motion. Neck supple.  Cardiovascular: Normal rate.   Respiratory: Effort normal.  GI: Soft.  Genitourinary:  Buried penis, Sp TTP, scat dribilling urine per uretrha  Musculoskeletal: Normal range of motion.  Neurological: He is alert.  Skin: Skin is warm and dry.  Psychiatric: He has a normal mood and affect. His behavior is normal. Judgment and thought content normal.   Blood and urine at meatus, therefore proceed with bedside cysto. 17F flexible cystoscopy performed using aseptic technique on buried penis after betadine penile cleaining. Inspection of anterior urethra unremakrble. Posterior urethral with some urethral trauma posteriorly at membranous area c/w likley prior malposition. Mild bilobar prostatic hypertrophy. Urinary bladder distended with cloudy urine and some clot.  0.38 sensor wire placed and cystoscope exhanced fro 9F council catheter with immediate efflus 800cc tea colord and brown urine. 10cc sterile water placed in balloon. Next baldder irrigated with 2L H2O in 60cc aliquots to clear removing moderate volueme small old clots and then connected to straight drain.    Assessment/Plan:  1 - Urethral Trauma / Urinary  Retention - Bedside cysto with placement 48F council catheter and clot irrigation performed as per above. Please keep current catheter x 10 days or more and then needs f/u at [facility] Dr. Tresa Moore for trial of void. Explained need to keep current foley to allow urethral healing for this interval. Disposition discussed with patient and family.  2 - Call with questions  [facility], Khadim Lundberg 07/30/2014, 6:39 PM

## 2014-07-30 NOTE — Progress Notes (Signed)
Patient: Charles [name] / Admit Date: 07/25/2014 / Date of Encounter: 07/30/2014, 11:36 AM   Subjective:  In a lot of pain due to bladder spasms. Waiting for urology.    Objective: Telemetry: paroxysms of atrial fib RVR with additional regular narrow complex tachycardia, rates 150s-190s, interspersed with NSR with PVCs and PACs  Physical Exam: Blood pressure 128/88, pulse 111, temperature 97.4 F (36.3 C), temperature source Oral, resp. rate 26, height 6\' 4"  (1.93 m), weight 395 lb 1 oz (179.2 kg), SpO2 97.00%. General: Well developed, well nourished, in no acute distress.Hard of hearing.  Head: Normocephalic, atraumatic, sclera non-icteric, no xanthomas, nares are without discharge. Neck: Negative for carotid bruits. JVP not elevated. Lungs: Clear bilaterally to auscultation without wheezes, rales, or rhonchi. Breathing is unlabored. Heart: RRR. S1 S2 without murmurs, rubs, or gallops.  Abdomen: Soft, non-tender, non-distended with normoactive bowel sounds. No rebound/guarding. Extremities: No clubbing or cyanosis. Bilateral trace edema to the knees. Distal pedal pulses are 2+ and equal bilaterally. Neuro: Alert and oriented X 3. Moves all extremities spontaneously. Psych:  Responds to questions appropriately with a normal affect.   Intake/Output Summary (Last 24 hours) at 07/30/14 1136 Last data filed at 07/30/14 0706  Gross per 24 hour  Intake   1790 ml  Output   4026 ml  Net  -2236 ml    Inpatient Medications:  . aspirin EC  81 mg Oral Daily  . clopidogrel  75 mg Oral Daily  . gabapentin  300 mg Oral BID  . insulin aspart  0-20 Units Subcutaneous 6 times per day  . insulin aspart  10 Units Subcutaneous BID AC & HS  . insulin aspart  12 Units Subcutaneous QAC supper  . insulin aspart  12 Units Subcutaneous QAC lunch  . levalbuterol  0.63 mg Nebulization QID  . losartan  100 mg Oral Daily  . metoprolol  25 mg Oral Q6H  . phenazopyridine  200 mg Oral TID WC  .  piperacillin-tazobactam (ZOSYN)  IV  3.375 g Intravenous Q8H  . [START ON 07/31/2014] predniSONE  30 mg Oral Q breakfast  . simvastatin  20 mg Oral q1800  . sodium chloride  3 mL Intravenous Q12H   Infusions:  . sodium chloride 10 mL/hr at 07/29/14 1619    Labs:  Recent Labs  07/28/14 0313 07/29/14 0322 07/30/14 0300  NA 139 138 141  K 4.1 4.1 3.8  CL 104 102 101  CO2 26 27 30   GLUCOSE 126* 304* 136*  BUN 19 18 17   CREATININE 0.75 0.77 0.76  CALCIUM 8.8 8.7 8.7  MG 2.1 2.0  --     Recent Labs  07/28/14 0313 07/29/14 0322  AST 25 17  ALT 14 13  ALKPHOS 58 61  BILITOT 0.3 0.2*  PROT 6.0 5.8*  ALBUMIN 2.3* 2.3*    Recent Labs  07/28/14 0313 07/29/14 0322 07/30/14 0300  WBC 14.1* 15.7* 13.5*  NEUTROABS 12.9* 14.3*  --   HGB 10.4* 10.3* 10.7*  HCT 32.8* 32.8* 33.0*  MCV 95.3 95.9 94.6  PLT 296 306 310     Radiology/Studies:  Dg Chest Port 1 View  07/25/2014   CLINICAL DATA:  Respiratory distress  EXAM: PORTABLE CHEST - 1 VIEW  COMPARISON:  05/25/2014  FINDINGS: There are bilateral interstitial and alveolar airspace opacities most severe in the right lower lung. There is prominence of the central pulmonary vasculature. There is no pneumothorax. There is no definite pleural effusion. The heart and  mediastinum are stable. There is thoracic aortic atherosclerosis.  The osseous structures are unremarkable.  IMPRESSION: Bilateral interstitial and alveolar airspace opacities, right greater than left, which may reflect pulmonary edema versus multilobar pneumonia.       Assessment and Plan  73 year old male with history of CAD (MI 50), PAF, DM with peripheral neuropathy, OSA on CPAP, HTN, CVA (2002), and morbid obesity (BMI 42.62) who was brought to [facility] via EMS 2/2 hypoxic respiratory failure, fever, chills and felt to have CAP. Noted to be tachycardic and hypotensive in the ED which improved with IV fluids. Received notification from primary team this morning that he  has had intermittent tachycardia and we are asked to see him regarding this.     Atrial arrhythmias: currently NSR in 90s. -- Appears that he has gone in and out of afib with RVR with possible additional SVT vs atrial flutter since yesterday -- Continue Lopressor 25mg  q6 -- He had continued to decline anticoagulation and wished to stay on his own therapy of Plavix and ASA per prior notes but now is willing to discuss coumadin therapy tomorrow morning. Currently he is feeling unwell with bladder spasms and does not feel up to the discussion.   CAP -Unlikely CHF, Troponins negative. Will continue daily weights (395 today).  -Has improved 2/2 to antibiotic therapy   CHF- 2D ECHO on 07/26/14 with EF 55-60%. Mild concentric hypertrophy. 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 Mild LA dilation.  -There is trace bilateral pitting edema to the knees. He received IV lasix yesterday and had good urine output. Leg edema improved   Hypotension: -Resolved, has resumed b-blocker  CAD s/p stent 2000, NSTEMI 2013 treated medically -On aspirin, Plavix, b-blocker, statin,  -TC 121, LDL 69, HDL 42, trigs 48   Diabetes mellitus with retinopathy/neuropathy -per IM   Dispo- unhappy with cardiology follow up as outpatient. Wants to follow with Dr. Aundra Dubin as his wife sees him. He was very angry and I spent >10 minutes talking to him about his concerns. Told him i would look into this for him.   Signed, Perry Mount, PA-C 07/30/2014 11:36 AM  I have examined the patient and reviewed assessment and plan and discussed with patient.  Agree with above as stated.  Plan to discuss coumadin tomorrow.  Plan for outpatient f/u with Dr. Aundra Dubin for AFib, diastolic heart failure.  Elianna Windom S.

## 2014-07-30 NOTE — Progress Notes (Addendum)
Pt with gross hematuria via foley   since 2200 hrs.   At beginning of shift 1900 hrs urine was yellow and clear, became pink tinged at 2100 hrs and frank hematuria with clots  since 2200hrs.  No u/o between midnight and 0200.  Foley irrigated with NSS 50 cc with return of 300 cc hematuric urine with clots.  VSS, asymoptomatic.  CBC  ordered for AM. Notified Walden Field, NP of  findings.   0430  No foley output from 0200-0400 but bed pad saturated with bloody urine.  Bladder scanned pt for 200 cc, irrigated foley with 50cc NSS with no return, irrigated again with 50 cc and return of 20 cc hematuriia..  Findings called to Walden Field, NP.  No orders received at this time.  Will continue to monitor.  Pt denies pain at this time.   0600  Pt c/o severe bladder spasm, requesting to go to [facility] for BM.  Highly anxious.  Spoke again with Walden Field, NP  .  Orders received .  Given ativan .5 mg po.  Bladder scanned for 237 cc. Scant bloody urine from foley.  Will conttinue to support pt.

## 2014-07-30 NOTE — Progress Notes (Signed)
Placed Pt on CPAP for the night.  Currently stable and tolerating well.  RT will monitor as needed.

## 2014-07-30 NOTE — Progress Notes (Signed)
[place] TEAM 1 - Stepdown/ICU TEAM Progress Note  Charles [name] YYT:[id number] DOB: 1941/11/25 DOA: 07/25/2014 PCP: [name], MD  Admit HPI / Brief Narrative: 73 y.o. M Hx Paroxysmal atrial fibrillation, obstructive sleep apnea, Hx stroke,Hx CAD, DM Type 2 with neuropathy, and retinopathy, HTN, HLD.  Endorsed was in his usual state of health when he started developing minimal shortness of breath. He reported "cold like symptoms". Over the next 2-3 days his symptoms progressively worsened. He did not have any cough initially but started coughing 24 hours prior to presentation and developed fever up to 101.51F. On the morning of admisison he woke up to go to the bathroom and became very short of breath, even with minimal exertion. He normally uses a CPAP every night and despite the use of CPAP he did not feel any better. His wife gave him her oxygen and one of her inhalers without any relief. Since his symptoms were not improving they called his primary care physician, who recommended that they call 911. When EMS arrived at the house he was found to be in respiratory distress. He was found to be cold, clammy, diaphoretic. Saturations initially were in 72%. He was placed on a nonrebreather and subsequently, on BiPAP. Was given Nebulizer treatments, steroids, and, he was brought into the emergency department.   In the emergency department it was noted that his blood pressure had been dropping. He was also tachycardic. Patient denied any dizziness, or any chest pain, per se. He was experiencing pleuritic type chest discomfort with coughing.  HPI/Subjective: Awake-restless earlier due to sensation can't void with foley in place-diaphoretic  Assessment/Plan: Hematuria/severe urinary urgency -suspect foley trauma due to accidental slippage of catheter into urethra after placement -attempted to irrigate foley but only severe pain and noted irrigant refluxed around catheter -foley dc'd- pt voided  small amount blood tinged with mucoid clots -post catheter removal significant urgency but bladder scan 0 cc- noted continual leaking of blood tinged urine so beginning Pyridium -warm pack to suprapubic area -230 p still no relief-RN attempt gentle I/O cath-no urine returned but pt with mild relief when comlete noting clot inside I/O cath- consulted Dr. Tresa Moore with urology- RN to obtain urology cart and place at bedisde  Sepsis due to CAP and H Flu Bacteremia/ESBL negative -cont empiric anbx's -BP soft in setting of RVR but o/w hemodynamically stable - Cortisol normal -peak lactic acid 4.7 but has normalized -PCT peaked at 2.76 -ID consulted and no indication for TEE for bacteremia -Will obtain another set of blood cultures am 8/19 and if clean place PICC line to complete course of IV antibiotics, minimum 2 weeks. -sensitivities pending - ESBL negative  Acute respiratory failure with hypoxia due to Haemophilus influenza CAP and acute COPD exac -cont current anbx's  -Continue flutter valve  -Taper Prednisone to 30 mg daily -cont Xopenex PRN/DuoNeb QID -Continue O2 to maintain SpO2 > 93%   -cont Flutter valve  HYPERTENSION with concentric hypertrophy -Continue metoprolol; continued hold other hypertensive medication.   -8/17: Was hypotensive when VR 130-170 but SBP back up to 130s with controlled VR therefore will KVO fluids and give 1x dose IV Lasix with excellent results -4300 cc out yesterday prior to issues with foley -marked decrease in edema especially penile/scrotal edema- Lasix at home was weekly dosed  PAF (paroxysmal atrial fibrillation)  -Cards had signed off due to rhythm stability but returned 8/17 due to RVR -rate better controlled on q 6 hour Lopressor -Attending MD spoke at  length w/pt and wife (8/16)- pt previously refused Coumadin and has done so again this admission- had rash with Xarelto -Plavix and ASA as prior to admit -TSH/Free T4 normal  MYOCARDIAL INFARCTION,  HX OF/CORONARY ARTERY DISEASE  -per Cards  -Echocardiogram shows a mild left atrial dilation and mild left ventricle hypertrophy.    Type 2 diabetes, uncontrolled, with retinopathy and neuropathy  -Continue patient's customized insulin regimen except for:  Lunch NovoLog 12 units  Resistant SSI - CBG improved continue hold Actos and Amaryl secondary to multiple episodes of hypoglycemia -Cont home regimen since this was the only one which kept his hemoglobin A1c close to being within ADA guidelines. -CBgs still > 200 at times but note decreased as Prednisone tapered  History of CVA (cerebrovascular accident) -Attending MD Sherral Hammers spoke at length to patient and wife on 8/16 concerning the efficacy of NOAC over current regimen given his risk factors. Patient and wife (who is an Therapist, sports) stated understands the risk and decline at this time.  HLD   -Continue Zocor 40 mg daily  -lipid panel this admit within ADA guidelines     Code Status: FULL Family Communication: Wife present at time of exam Disposition Plan: SDU until RVR stable and sepsis physiology resolved  Consultants: Dr. Doren Custard Nahser(cardiology)  Dr. Scharlene Gloss (Infectious disease)  Procedure/Significant Events: 8/14 echocardiogram Left ventricle: mild concentric hypertrophy. LVEF=55% to 60%. -Ascending aorta: ascending aorta was mildly dilated. - Left atrium: mildly dilated.  Culture 8/13 blood right arm/hand positive for (GNR) HAEMOPHILUS INFLUENZAE/ESBL negative 8/13 MRSA PCR negative  8/14 urine negative  Antibiotics: Azithromycin 8/13 >>> stopped 8/14  Ceftriaxone 8/13 >>> stopped 8/14  Levofloxacin x1 dose  Vancomycin 8./13>> stopped 8/16 Zosyn 8/14>>  DVT prophylaxis: Lovenox  Objective: VITAL SIGNS: Temp: 97.4 F (36.3 C) (08/18 0739) Temp src: Oral (08/18 0739) BP: 128/88 mmHg (08/18 0739) Pulse Rate: 111 (08/18 0739) SPO2; 97% on 3 L via [place] FIO2:   Intake/Output Summary (Last 24 hours) at  07/30/14 1108 Last data filed at 07/30/14 6160  Gross per 24 hour  Intake   1790 ml  Output   4026 ml  Net  -2236 ml   Exam: General: No resting respiratory distress Lungs: fine crackles th/o all fields - no wheeze  Cardiovascular: regular rate without murmur gallop or rub normal S1 and S2 Abdomen: Nontender, nondistended, soft, bowel sounds positive, no rebound, no ascites, no appreciable mass Extremities: No significant cyanosis, clubbing. Bilateral 2+ pitting edema to knees which is baseline  Data Reviewed: Basic Metabolic Panel:  Recent Labs Lab 07/26/14 1202 07/27/14 0315 07/28/14 0313 07/29/14 0322 07/30/14 0300  NA 141 140 139 138 141  K 3.9 4.2 4.1 4.1 3.8  CL 105 105 104 102 101  CO2 22 25 26 27 30   GLUCOSE 120* 67* 126* 304* 136*  BUN 30* 28* 19 18 17   CREATININE 0.88 0.82 0.75 0.77 0.76  CALCIUM 8.8 8.5 8.8 8.7 8.7  MG  --   --  2.1 2.0  --    Liver Function Tests:  Recent Labs Lab 07/26/14 0113 07/28/14 0313 07/29/14 0322  AST 19 25 17   ALT 11 14 13   ALKPHOS 68 58 61  BILITOT 0.6 0.3 0.2*  PROT 5.8* 6.0 5.8*  ALBUMIN 2.4* 2.3* 2.3*   CBC:  Recent Labs Lab 07/25/14 1204 07/26/14 0113 07/27/14 0315 07/28/14 0313 07/29/14 0322 07/30/14 0300  WBC 9.5 16.3* 13.1* 14.1* 15.7* 13.5*  NEUTROABS 8.3*  --   --  12.9* 14.3*  --   HGB 11.6* 10.0* 11.1* 10.4* 10.3* 10.7*  HCT 36.8* 33.0* 35.0* 32.8* 32.8* 33.0*  MCV 97.1 97.9 98.0 95.3 95.9 94.6  PLT 264 253 252 296 306 310   Cardiac Enzymes:  Recent Labs Lab 07/25/14 2115 07/26/14 0113 07/26/14 0737  TROPONINI <0.30 <0.30 <0.30   BNP (last 3 results)  Recent Labs  07/25/14 1209  PROBNP 1647.0*   CBG:  Recent Labs Lab 07/29/14 1650 07/29/14 2033 07/29/14 2352 07/30/14 0352 07/30/14 0736  GLUCAP 208* 247* 202* 120* 136*    Recent Results (from the past 240 hour(s))  CULTURE, BLOOD (ROUTINE X 2)     Status: None   Collection Time    07/25/14  1:40 PM      Result Value Ref  Range Status   Specimen Description BLOOD ARM RIGHT   Final   Special Requests BOTTLES DRAWN AEROBIC AND ANAEROBIC 10CC   Final   Culture  Setup Time     Final   Value: 07/25/2014 17:26     Performed at [facility]   Culture     Final   Value: HAEMOPHILUS INFLUENZAE     Note: BETA LACTAMASE NEGATIVE     Note: Gram Stain Report Called to,Read Back By and Verified With: Gus Height RN [phone]     Performed at [facility]   Report Status 07/27/2014 FINAL   Final  CULTURE, BLOOD (ROUTINE X 2)     Status: None   Collection Time    07/25/14  1:50 PM      Result Value Ref Range Status   Specimen Description BLOOD HAND RIGHT   Final   Special Requests BOTTLES DRAWN AEROBIC AND ANAEROBIC 10CC   Final   Culture  Setup Time     Final   Value: 07/25/2014 17:26     Performed at [facility]   Culture     Final   Value: HAEMOPHILUS INFLUENZAE     Note: BETA LACTAMASE NEGATIVE     Note: Gram Stain Report Called to,Read Back By and Verified With: Gus Height RN [phone]     Performed at [facility]   Report Status 07/27/2014 FINAL   Final  MRSA PCR SCREENING     Status: None   Collection Time    07/25/14  7:10 PM      Result Value Ref Range Status   MRSA by PCR NEGATIVE  NEGATIVE Final   Comment:            The GeneXpert MRSA Assay (FDA     approved for NASAL specimens     only), is one component of a     comprehensive MRSA colonization     surveillance program. It is not     intended to diagnose MRSA     infection nor to guide or     monitor treatment for     MRSA infections.  URINE CULTURE     Status: None   Collection Time    07/26/14  3:00 PM      Result Value Ref Range Status   Specimen Description URINE, CLEAN CATCH   Final   Special Requests NONE   Final   Culture  Setup Time     Final   Value: 07/26/2014 18:59     Performed at [facility]     Final   Value: 1,000 COLONIES/ML     Performed at  [facility]     Final   Value: INSIGNIFICANT GROWTH     Performed at [facility]   Report Status 07/27/2014 FINAL   Final     Studies:  Recent x-ray studies have been reviewed in detail by the Attending Physician  Scheduled Meds:  Scheduled Meds: . aspirin EC  81 mg Oral Daily  . clopidogrel  75 mg Oral Daily  . gabapentin  300 mg Oral BID  . insulin aspart  0-20 Units Subcutaneous 6 times per day  . insulin aspart  10 Units Subcutaneous BID AC & HS  . insulin aspart  12 Units Subcutaneous QAC supper  . insulin aspart  12 Units Subcutaneous QAC lunch  . levalbuterol  0.63 mg Nebulization QID  . losartan  100 mg Oral Daily  . metoprolol  25 mg Oral Q6H  . phenazopyridine  200 mg Oral TID WC  . piperacillin-tazobactam (ZOSYN)  IV  3.375 g Intravenous Q8H  . [START ON 07/31/2014] predniSONE  30 mg Oral Q breakfast  . simvastatin  20 mg Oral q1800  . sodium chloride  3 mL Intravenous Q12H    Time spent on care of this patient: 35 mins   ELLIS,ALLISON L. , ANP   [facility] Office  [phone] Pager - [phone]  On-Call/Text Page:      [email or web address]      password [email or web address]  If 7PM-7AM, please contact night-coverage [email or web address] Password [email or web address] 07/30/2014, 11:08 AM   LOS: 5 days   Examined patient with ANP Ebony Hail discussed assessment and plan and agree with the above plan. Discussed plan of care with patient's wife and answered all questions. Patient with multiple complex medical problems> 40 minutes spent in direct patient care

## 2014-07-30 NOTE — Progress Notes (Signed)
Pt still attempting to try and void since catheter was removed this am. Pt has had dribbling and medium blood clots to come out of his penis but has not been able to void despite the urge. Attempted to bladder scan the pt again but due to the pts size and anatomy, I think the bladder scan was inadequate as the pt was in a lot of pain while attempting to get a reading from the scan. Even after rolling bladder scanner around lower abdomen it directed me to place it more towards the pts penis area and there still was a reading of 61m. With order from NP; attempted gently and carefully to in and out cath the pt per pt and wife request. While inserting the catheter slowly and gently into the urethra resistance was met and the procedure was canceled. Pt expressed that while the catheter was in place that he felt relief; when it was removed there was a large clot inside the catheter. Wife assisted with procedure and pt tolerated well. NP was notified.

## 2014-07-31 LAB — CBC
HEMATOCRIT: 34.7 % — AB (ref 39.0–52.0)
Hemoglobin: 11.3 g/dL — ABNORMAL LOW (ref 13.0–17.0)
MCH: 31.1 pg (ref 26.0–34.0)
MCHC: 32.6 g/dL (ref 30.0–36.0)
MCV: 95.6 fL (ref 78.0–100.0)
PLATELETS: 305 10*3/uL (ref 150–400)
RBC: 3.63 MIL/uL — AB (ref 4.22–5.81)
RDW: 15.7 % — AB (ref 11.5–15.5)
WBC: 14.1 10*3/uL — AB (ref 4.0–10.5)

## 2014-07-31 LAB — BASIC METABOLIC PANEL
ANION GAP: 9 (ref 5–15)
BUN: 17 mg/dL (ref 6–23)
CO2: 34 meq/L — AB (ref 19–32)
CREATININE: 0.92 mg/dL (ref 0.50–1.35)
Calcium: 8.9 mg/dL (ref 8.4–10.5)
Chloride: 101 mEq/L (ref 96–112)
GFR calc Af Amer: >90 mL/min (ref >90)
GFR calc non Af Amer: 82 mL/min — ABNORMAL LOW (ref >90)
Glucose, Bld: 122 mg/dL — ABNORMAL HIGH (ref 70–99)
POTASSIUM: 4.4 meq/L (ref 3.7–5.3)
Sodium: 144 mEq/L (ref 137–147)

## 2014-07-31 LAB — GLUCOSE, CAPILLARY
GLUCOSE-CAPILLARY: 121 mg/dL — AB (ref 70–99)
GLUCOSE-CAPILLARY: 182 mg/dL — AB (ref 70–99)
Glucose-Capillary: 107 mg/dL — ABNORMAL HIGH (ref 70–99)
Glucose-Capillary: 125 mg/dL — ABNORMAL HIGH (ref 70–99)
Glucose-Capillary: 144 mg/dL — ABNORMAL HIGH (ref 70–99)
Glucose-Capillary: 156 mg/dL — ABNORMAL HIGH (ref 70–99)
Glucose-Capillary: 170 mg/dL — ABNORMAL HIGH (ref 70–99)

## 2014-07-31 LAB — PROTIME-INR
INR: 1.09 (ref 0.00–1.49)
Prothrombin Time: 14.1 seconds (ref 11.6–15.2)

## 2014-07-31 MED ORDER — WARFARIN VIDEO
Freq: Once | Status: AC
  Administered 2014-07-31: 16:00:00

## 2014-07-31 MED ORDER — ENOXAPARIN SODIUM 150 MG/ML ~~LOC~~ SOLN
180.0000 mg | Freq: Two times a day (BID) | SUBCUTANEOUS | Status: DC
  Administered 2014-07-31 (×2): 180 mg via SUBCUTANEOUS
  Filled 2014-07-31 (×4): qty 2

## 2014-07-31 MED ORDER — WARFARIN SODIUM 10 MG PO TABS
10.0000 mg | ORAL_TABLET | Freq: Once | ORAL | Status: AC
  Administered 2014-07-31: 10 mg via ORAL
  Filled 2014-07-31: qty 1

## 2014-07-31 MED ORDER — PREDNISONE 20 MG PO TABS
20.0000 mg | ORAL_TABLET | Freq: Every day | ORAL | Status: DC
  Administered 2014-08-01 – 2014-08-02 (×2): 20 mg via ORAL
  Filled 2014-07-31 (×3): qty 1

## 2014-07-31 MED ORDER — COUMADIN BOOK
Freq: Once | Status: AC
  Administered 2014-07-31: 1
  Filled 2014-07-31: qty 1

## 2014-07-31 MED ORDER — WARFARIN - PHARMACIST DOSING INPATIENT
Freq: Every day | Status: DC
  Administered 2014-08-01 – 2014-08-02 (×2)

## 2014-07-31 NOTE — Progress Notes (Signed)
Called to attempt to give report but floor nurse accepting pt needs to call back. Pt walked this am in hall with nurse. Pt's oxygen level is in 90's and he is now on 2 liters of oxygen as nurse titrated his oxygen down from 4 liters to 2. Pt just finished lunch and now will transfer to floor [place] 16 per md orders.

## 2014-07-31 NOTE — Progress Notes (Signed)
ANTICOAGULATION CONSULT NOTE - Initial Consult  Pharmacy Consult for Lovenxo and Coumadin Indication: atrial fibrillation  Allergies  Allergen Reactions  . Xarelto [Rivaroxaban]     rash    Patient Measurements: Height: 6\' 4"  (193 cm) Weight: 395 lb 1 oz (179.2 kg) IBW/kg (Calculated) : 86.8  Vital Signs: Temp: 98.5 F (36.9 C) (08/19 0800) Temp src: Oral (08/19 0800) BP: 127/52 mmHg (08/19 0917) Pulse Rate: 83 (08/19 0917)  Labs:  Recent Labs  07/29/14 0322 07/30/14 0300 07/31/14 0310  HGB 10.3* 10.7* 11.3*  HCT 32.8* 33.0* 34.7*  PLT 306 310 305  CREATININE 0.77 0.76 0.92    Estimated Creatinine Clearance: 127.1 ml/min (by C-G formula based on Cr of 0.92).   Medical History: Past Medical History  Diagnosis Date  . CAD (coronary artery disease)     a. Approx. 2000 - MI. Cath showed single vessel disease, PTCA dLAD/medical management. ;  b. NSTEMI 11/13 => LHC: prox and mid LAD 30%, dLAD 60%, pCFX 30%, inf branch of OM 40%, mRCA 50-60%, EF 55-60%; IVUS attempted for RCA but not successful; anatomy felt stable from 2000 => med Rx.  . Diverticulitis   . Hypertension   . Morbid obesity   . CVA (cerebral infarction)     a. Approx. 2002  . Cholelithiasis   . Hypercholesterolemia   . ED (erectile dysfunction)   . OSA on CPAP   . Type II diabetes mellitus   . History of stomach ulcers   . History of MRSA infection     "little toe right foot" (10/26/2012)  . Tunnel vision     "both eyes since stroke"  . Claustrophobia   . B12 deficiency   . Diabetic neuropathy   . PAF (paroxysmal atrial fibrillation)     a. confirmed by event monitor. b. 02/2014 rash on Coumadin, patient decided to discontinue Xarelto due to possible rash, cost and lawyers ads on TV, agreed to take Plavix.    Medications:  Prescriptions prior to admission  Medication Sig Dispense Refill  . acetaminophen (TYLENOL) 500 MG tablet Take 1,000 mg by mouth at bedtime.      Marland Kitchen aspirin 81 MG tablet  Take 81 mg by mouth daily.        . Cholecalciferol (EQL VITAMIN D3) 1000 UNITS tablet Take 1,000 Units by mouth daily.        . clopidogrel (PLAVIX) 75 MG tablet Take 1 tablet (75 mg total) by mouth daily.  90 tablet  3  . Dextromethorphan HBr (VICKS DAYQUIL COUGH) 15 MG/15ML LIQD Take 15 mLs by mouth daily as needed (cough).      . furosemide (LASIX) 80 MG tablet Take 80 mg by mouth once a week.      . gabapentin (NEURONTIN) 300 MG capsule Take 300 mg by mouth 2 (two) times daily.      Marland Kitchen glimepiride (AMARYL) 4 MG tablet Take 1 tablet (4 mg total) by mouth 2 (two) times daily.  180 tablet  3  . guaiFENesin (MUCINEX) 600 MG 12 hr tablet Take 600 mg by mouth 2 (two) times daily.      Marland Kitchen ibuprofen (ADVIL,MOTRIN) 200 MG tablet Take 400 mg by mouth every 8 (eight) hours as needed for fever.      . insulin lispro (HUMALOG KWIKPEN) 100 UNIT/ML KiwkPen Inject 5-20 Units into the skin 4 (four) times daily - after meals and at bedtime. Per sliding scale      . losartan (COZAAR) 100 MG tablet  Take 1 tablet (100 mg total) by mouth daily.  90 tablet  3  . metoprolol (LOPRESSOR) 50 MG tablet Take 1 tablet (50 mg total) by mouth 2 (two) times daily.  180 tablet  3  . pioglitazone (ACTOS) 45 MG tablet Take 1 tablet (45 mg total) by mouth daily.  90 tablet  3  . pravastatin (PRAVACHOL) 40 MG tablet Take 1 tablet (40 mg total) by mouth daily.  90 tablet  3  . Pseudoeph-Doxylamine-DM-APAP (NYQUIL PO) Take 15 mLs by mouth daily as needed (cough).      . triamcinolone cream (KENALOG) 0.5 % Apply 1 application topically 2 (two) times daily as needed (rash).      . vitamin B-12 (CYANOCOBALAMIN) 1000 MCG tablet Take 1,000 mcg by mouth daily.        . nitroGLYCERIN (NITROSTAT) 0.4 MG SL tablet Place 0.4 mg under the tongue every 5 (five) minutes as needed. For chest pain        Assessment: 73 y.o. male presented 8/13 with SOB. Known to pharmacy from abx dosing this admit. Noted pt in/out of afib with RVR. Has been on  Xarelto in the past but developed rash so no longer taking. Pt has agreed to start coumadin with lovenox bridge. No recent INR. CBC stable this morning. SCr remains stable with CrCl > 100 ml/min. Noted pt wt = 179 kg.  Goal of Therapy:  Anti-Xa level 0.6-1 units/ml 4hrs after LMWH dose given; INR 2-3 Monitor platelets by anticoagulation protocol: Yes   Plan:  1. Baseline INR 2. Coumadin 10mg  po tonight 3. Lovenox 180mg  SQ q12h 4. CBC q72h while on Lovenox 5. Coumadin educational book/video  Sherlon Handing, PharmD, BCPS Clinical pharmacist, pager [phone] 07/31/2014,11:06 AM

## 2014-07-31 NOTE — Progress Notes (Signed)
[place] TEAM 1 - Stepdown/ICU TEAM Progress Note  Charles [name]:[id number] DOB: 1941/05/20 DOA: 07/25/2014 PCP: [name], MD  Admit HPI / Brief Narrative: 73 y.o. M Hx Paroxysmal atrial fibrillation, obstructive sleep apnea, Hx stroke, Hx CAD, DM Type 2 with neuropathy, and retinopathy, HTN, HLD.  Endorsed was in his usual state of health when he started developing minimal shortness of breath. He reported "cold like symptoms". Over the next 2-3 days his symptoms progressively worsened. He did not have any cough initially but started coughing 24 hours prior to presentation and developed fever up to 101.79F. On the morning of admisison he woke up to go to the bathroom and became very short of breath, even with minimal exertion. He normally uses a CPAP every night and despite the use of CPAP he did not feel any better. His wife gave him her oxygen and one of her inhalers without any relief. Since his symptoms were not improving they called his primary care physician, who recommended that they call 911. When EMS arrived at the house he was found to be in respiratory distress. He was found to be cold, clammy, diaphoretic. Saturations initially were in 72%. He was placed on a nonrebreather and subsequently, on BiPAP. Was given Nebulizer treatments, steroids, and, he was brought into the emergency department.   In the emergency department it was noted that his blood pressure had been dropping. He was also tachycardic. Patient denied any dizziness, or any chest pain, per se. He was experiencing pleuritic type chest discomfort with coughing.  HPI/Subjective: Awake-endorsing feels much better now that foley in place.  No new complaints today.  Has agreed to take coumadin.    Assessment/Plan:  Hematuria/severe urinary urgency -suspect foley trauma due to accidental slippage of catheter into urethra after placement -appreciate assistance of Urology/Dr. Bonney Leitz' catheter placed under  cystoscopy 8/18 and to remain in place for at least 10 days -pt needs to follow up with Dr. Tresa Moore for voiding trial as outpt -still with some older looking maroon colored blood in catheter -post catheter UA abnormal so FU on cx  Sepsis due to CAP and H Flu Bacteremia / ESBL negative -cont empiric anbx's but since is ESBL negative can convert to PO anbx to complete bactermia treatment at dc -BP soft in setting of RVR but o/w hemodynamically stable - Cortisol normal -peak lactic acid 4.7 but has normalized -PCT peaked at 2.76 -ID consulted and no indication for TEE for bacteremia -blood cx's repeated 8/19  Acute respiratory failure with hypoxia due to Haemophilus influenza CAP and acute COPD exac -cont current anbx's  -weaning oxygen slowly -Continue flutter valve  -Taper Prednisone to 20 mg daily 8/19-have been decreasing by 10 mg daily -cont Xopenex PRN/DuoNeb QID -Continue O2 to maintain SpO2 > 93%   -cont Flutter valve -f/u CXR in AM  HYPERTENSION with concentric hypertrophy -Continue metoprolol; continue hold other hypertensive medication.   -8/17: Was hypotensive when VR 130-170 but SBP back up to 130s with controlled VR so IVFs to kvo -marked decrease in edema especially penile/scrotal edema after 1x dose IV Lasix- Lasix at home was weekly dosed  PAF (paroxysmal atrial fibrillation)  -Cards had signed off due to rhythm stability but returned 8/17 due to RVR -rate now controlled on q 6 hour Lopressor -Cardiology has spoken extensively with the pt who now agrees to begin Coumadin 8/19  -Was on Plavix and ASA as prior to admit for AF and CAD -TSH/Free T4 normal  MYOCARDIAL INFARCTION, HX OF/CORONARY ARTERY DISEASE  -per Cards  -Echocardiogram shows a mild left atrial dilation and mild left ventricle hypertrophy.    Type 2 diabetes, uncontrolled, with retinopathy and neuropathy  -Continue patient's customized insulin regimen except for:  Lunch NovoLog 12 units  Resistant  SSI -CBG improved continue hold Actos and Amaryl secondary to multiple episodes of hypoglycemia -Cont home regimen since this was the only one which kept his hemoglobin A1c close to being within ADA guidelines. -CBGs finally < 200 since Prednisone tapered  History of CVA (cerebrovascular accident)  HLD   -Continue Zocor 40 mg daily  -lipid panel this admit within ADA guidelines  Code Status: FULL Family Communication: Wife present at time of exam Disposition Plan: Transfer to Telemetry  Consultants: Dr. Doren Custard Nahser(cardiology)  Dr. Scharlene Gloss (Infectious disease)  Procedure/Significant Events: 8/14 echocardiogram Left ventricle: mild concentric hypertrophy. LVEF=55% to 60%. -Ascending aorta: ascending aorta was mildly dilated. - Left atrium: mildly dilated.  Culture 8/13 blood right arm/hand positive for (GNR) HAEMOPHILUS INFLUENZAE/ESBL negative 8/13 MRSA PCR negative  8/14 urine negative  Antibiotics: Azithromycin 8/13 >>> stopped 8/14  Ceftriaxone 8/13 >>> stopped 8/14  Levofloxacin x1 dose  Vancomycin 8./13>> stopped 8/16 Zosyn 8/14>>  DVT prophylaxis: Lovenox dc'd 8/18 due to hematuria SCDs  Objective: VITAL SIGNS: Temp: 98.1 F (36.7 C) (08/19 1158) Temp src: Oral (08/19 1158) BP: 119/47 mmHg (08/19 1158) Pulse Rate: 77 (08/19 1158) SPO2; 97% on 3 L via [place]  Intake/Output Summary (Last 24 hours) at 07/31/14 1421 Last data filed at 07/31/14 1358  Gross per 24 hour  Intake   1583 ml  Output   4975 ml  Net  -3392 ml   Exam: General: No resting respiratory distress Lungs: fine crackles th/o all fields - no wheeze  Cardiovascular: regular rate without murmur gallop or rub normal S1 and S2 Abdomen: Nontender, nondistended, soft, bowel sounds positive, no rebound, no ascites, no appreciable mass GU: Foley in place with maroon colored blood tinged urine but no clots Extremities: No significant cyanosis, clubbing. Bilateral 2+ pitting edema to knees  which is baseline  Data Reviewed: Basic Metabolic Panel:  Recent Labs Lab 07/27/14 0315 07/28/14 0313 07/29/14 0322 07/30/14 0300 07/31/14 0310  NA 140 139 138 141 144  K 4.2 4.1 4.1 3.8 4.4  CL 105 104 102 101 101  CO2 25 26 27 30  34*  GLUCOSE 67* 126* 304* 136* 122*  BUN 28* 19 18 17 17   CREATININE 0.82 0.75 0.77 0.76 0.92  CALCIUM 8.5 8.8 8.7 8.7 8.9  MG  --  2.1 2.0  --   --    Liver Function Tests:  Recent Labs Lab 07/26/14 0113 07/28/14 0313 07/29/14 0322  AST 19 25 17   ALT 11 14 13   ALKPHOS 68 58 61  BILITOT 0.6 0.3 0.2*  PROT 5.8* 6.0 5.8*  ALBUMIN 2.4* 2.3* 2.3*   CBC:  Recent Labs Lab 07/25/14 1204  07/27/14 0315 07/28/14 0313 07/29/14 0322 07/30/14 0300 07/31/14 0310  WBC 9.5  < > 13.1* 14.1* 15.7* 13.5* 14.1*  NEUTROABS 8.3*  --   --  12.9* 14.3*  --   --   HGB 11.6*  < > 11.1* 10.4* 10.3* 10.7* 11.3*  HCT 36.8*  < > 35.0* 32.8* 32.8* 33.0* 34.7*  MCV 97.1  < > 98.0 95.3 95.9 94.6 95.6  PLT 264  < > 252 296 306 310 305  < > = values in this interval not displayed.  Cardiac  Enzymes:  Recent Labs Lab 07/25/14 2115 07/26/14 0113 07/26/14 0737  TROPONINI <0.30 <0.30 <0.30   BNP (last 3 results)  Recent Labs  07/25/14 1209  PROBNP 1647.0*   CBG:  Recent Labs Lab 07/30/14 2007 07/31/14 0042 07/31/14 0432 07/31/14 0836 07/31/14 1206  GLUCAP 180* 125* 107* 121* 182*    Recent Results (from the past 240 hour(s))  CULTURE, BLOOD (ROUTINE X 2)     Status: None   Collection Time    07/25/14  1:40 PM      Result Value Ref Range Status   Specimen Description BLOOD ARM RIGHT   Final   Special Requests BOTTLES DRAWN AEROBIC AND ANAEROBIC 10CC   Final   Culture  Setup Time     Final   Value: 07/25/2014 17:26     Performed at [facility]   Culture     Final   Value: HAEMOPHILUS INFLUENZAE     Note: BETA LACTAMASE NEGATIVE     Note: Gram Stain Report Called to,Read Back By and Verified With: Gus Height RN [phone]      Performed at [facility]   Report Status 07/27/2014 FINAL   Final  CULTURE, BLOOD (ROUTINE X 2)     Status: None   Collection Time    07/25/14  1:50 PM      Result Value Ref Range Status   Specimen Description BLOOD HAND RIGHT   Final   Special Requests BOTTLES DRAWN AEROBIC AND ANAEROBIC 10CC   Final   Culture  Setup Time     Final   Value: 07/25/2014 17:26     Performed at [facility]   Culture     Final   Value: HAEMOPHILUS INFLUENZAE     Note: BETA LACTAMASE NEGATIVE     Note: Gram Stain Report Called to,Read Back By and Verified With: Gus Height RN [phone]     Performed at [facility]   Report Status 07/27/2014 FINAL   Final  MRSA PCR SCREENING     Status: None   Collection Time    07/25/14  7:10 PM      Result Value Ref Range Status   MRSA by PCR NEGATIVE  NEGATIVE Final   Comment:            The GeneXpert MRSA Assay (FDA     approved for NASAL specimens     only), is one component of a     comprehensive MRSA colonization     surveillance program. It is not     intended to diagnose MRSA     infection nor to guide or     monitor treatment for     MRSA infections.  URINE CULTURE     Status: None   Collection Time    07/26/14  3:00 PM      Result Value Ref Range Status   Specimen Description URINE, CLEAN CATCH   Final   Special Requests NONE   Final   Culture  Setup Time     Final   Value: 07/26/2014 18:59     Performed at [place]     Final   Value: 1,000 COLONIES/ML     Performed at [facility]   Culture     Final   Value: INSIGNIFICANT GROWTH     Performed at [facility]   Report Status 07/27/2014 FINAL   Final     Studies:  Recent x-ray studies have been reviewed in detail by the Attending Physician  Scheduled Meds:  Scheduled Meds: . aspirin EC  81 mg Oral Daily  . clopidogrel  75 mg Oral Daily  . enoxaparin (LOVENOX) injection  180 mg Subcutaneous Q12H  . gabapentin  300 mg Oral  BID  . insulin aspart  0-20 Units Subcutaneous 6 times per day  . insulin aspart  10 Units Subcutaneous BID AC & HS  . insulin aspart  12 Units Subcutaneous QAC supper  . insulin aspart  12 Units Subcutaneous QAC lunch  . levalbuterol  0.63 mg Nebulization TID  . losartan  100 mg Oral Daily  . metoprolol  25 mg Oral Q6H  . phenazopyridine  200 mg Oral TID WC  . piperacillin-tazobactam (ZOSYN)  IV  3.375 g Intravenous Q8H  . predniSONE  30 mg Oral Q breakfast  . simvastatin  20 mg Oral q1800  . sodium chloride  3 mL Intravenous Q12H  . warfarin  10 mg Oral ONCE-1800  . warfarin   Does not apply Once  . Warfarin - Pharmacist Dosing Inpatient   Does not apply q1800   Time spent on care of this patient: 35 mins  ELLIS,ALLISON L. , ANP   [facility] Office  [phone] Pager - [phone]  On-Call/Text Page:      [email or web address]      password [email or web address]  If 7PM-7AM, please contact night-coverage [email or web address] Password [email or web address] 07/31/2014, 2:21 PM   LOS: 6 days   I have personally examined this patient and reviewed the entire database. I have reviewed the above note, made any necessary editorial changes, and agree with its content.  Cherene Altes, MD [facility]

## 2014-07-31 NOTE — Progress Notes (Signed)
Report given to receiving RN. Patient sitting in recliner, with family at the bedside. No verbal complaints and no signs or symptoms of distress or discomfort noted.

## 2014-07-31 NOTE — Progress Notes (Signed)
Gave report to [place]. Pt is leaving with nurse assistant to go to floor shortly.

## 2014-07-31 NOTE — Progress Notes (Signed)
PT Cancellation Note  Patient Details Name: Charles [name] MRN: [id number] DOB: [birth year]   Cancelled Treatment:    Reason Eval/Treat Not Completed: Patient unavailable at this time. When PT arrived to see patient he had just transferred to the unit and nursing in room with patient for admissions assessment. Will check back as schedule allows.    Jolyn Lent 07/31/2014, 4:08 PM  Jolyn Lent, PT, DPT Acute Rehabilitation Services Pager: [phone]

## 2014-07-31 NOTE — Progress Notes (Signed)
Patient: Charles [name] / Admit Date: 07/25/2014 / Date of Encounter: 07/31/2014, 7:46 AM   Subjective:  Feel a lot better today. No complaints. Wants to start coumadin.   Objective: Telemetry: paroxysms of atrial fib RVR with additional regular narrow complex tachycardia, rates 150s-190s, interspersed with NSR with PVCs and PACs  Physical Exam: Blood pressure 157/65, pulse 78, temperature 98.3 F (36.8 C), temperature source Axillary, resp. rate 19, height 6\' 4"  (1.93 m), weight 395 lb 1 oz (179.2 kg), SpO2 94.00%. General: Well developed, well nourished, in no acute distress.Hard of hearing.  Head: Normocephalic, atraumatic, sclera non-icteric, no xanthomas, nares are without discharge. Neck: Negative for carotid bruits. JVP not elevated. Lungs: Clear bilaterally to auscultation without wheezes, rales, or rhonchi. Breathing is unlabored. Heart: RRR. S1 S2 without murmurs, rubs, or gallops.  Abdomen: Soft, non-tender, non-distended with normoactive bowel sounds. No rebound/guarding. Extremities: No clubbing or cyanosis. Bilateral trace edema to the knees. Distal pedal pulses are 2+ and equal bilaterally. Neuro: Alert and oriented X 3. Moves all extremities spontaneously. Psych:  Responds to questions appropriately with a normal affect.   Intake/Output Summary (Last 24 hours) at 07/31/14 0746 Last data filed at 07/31/14 0700  Gross per 24 hour  Intake   1340 ml  Output   4475 ml  Net  -3135 ml    Inpatient Medications:  . aspirin EC  81 mg Oral Daily  . clopidogrel  75 mg Oral Daily  . gabapentin  300 mg Oral BID  . insulin aspart  0-20 Units Subcutaneous 6 times per day  . insulin aspart  10 Units Subcutaneous BID AC & HS  . insulin aspart  12 Units Subcutaneous QAC supper  . insulin aspart  12 Units Subcutaneous QAC lunch  . levalbuterol  0.63 mg Nebulization TID  . losartan  100 mg Oral Daily  . metoprolol  25 mg Oral Q6H  . phenazopyridine  200 mg Oral TID WC  .  piperacillin-tazobactam (ZOSYN)  IV  3.375 g Intravenous Q8H  . predniSONE  30 mg Oral Q breakfast  . simvastatin  20 mg Oral q1800  . sodium chloride  3 mL Intravenous Q12H   Infusions:  . sodium chloride 10 mL/hr at 07/29/14 1619    Labs:  Recent Labs  07/29/14 0322 07/30/14 0300 07/31/14 0310  NA 138 141 144  K 4.1 3.8 4.4  CL 102 101 101  CO2 27 30 34*  GLUCOSE 304* 136* 122*  BUN 18 17 17   CREATININE 0.77 0.76 0.92  CALCIUM 8.7 8.7 8.9  MG 2.0  --   --     Recent Labs  07/29/14 0322  AST 17  ALT 13  ALKPHOS 61  BILITOT 0.2*  PROT 5.8*  ALBUMIN 2.3*    Recent Labs  07/29/14 0322 07/30/14 0300 07/31/14 0310  WBC 15.7* 13.5* 14.1*  NEUTROABS 14.3*  --   --   HGB 10.3* 10.7* 11.3*  HCT 32.8* 33.0* 34.7*  MCV 95.9 94.6 95.6  PLT 306 310 305     Radiology/Studies:  Dg Chest Port 1 View  07/25/2014   CLINICAL DATA:  Respiratory distress  EXAM: PORTABLE CHEST - 1 VIEW  COMPARISON:  05/25/2014  FINDINGS: There are bilateral interstitial and alveolar airspace opacities most severe in the right lower lung. There is prominence of the central pulmonary vasculature. There is no pneumothorax. There is no definite pleural effusion. The heart and mediastinum are stable. There is thoracic aortic atherosclerosis.  The osseous structures are unremarkable.  IMPRESSION: Bilateral interstitial and alveolar airspace opacities, right greater than left, which may reflect pulmonary edema versus multilobar pneumonia.       Assessment and Plan  73 year old male with history of CAD (MI 70), PAF, DM with peripheral neuropathy, OSA on CPAP, HTN, CVA (2002), and morbid obesity (BMI 42.62) who was brought to [facility] via EMS 2/2 hypoxic respiratory failure, fever, chills and felt to have CAP. Noted to be tachycardic and hypotensive in the ED which improved with IV fluids. Received notification from primary team this morning that he has had intermittent tachycardia and we are asked to see  him regarding this.     Atrial arrhythmias: currently NSR in 90s. -- Appears that he has gone in and out of afib with RVR with possible additional SVT vs atrial flutter> a few episodes this morning.  -- Continue Lopressor 25mg  q6 -- He had continued to decline anticoagulation and wished to stay on his own therapy of Plavix and ASA (was on Xarelto in past but stopped due to cost and possible rash?) but now agrees to start coumadin.  -- Will have pharmacy consult on starting coumadin and will bridge with Lovenox.  Was having some bleeding from cath site but Dr. Dorothy Puffer with urology has clear anticoagulation initiation. No procedures planned.   CAP -- Unlikely CHF, Troponins negative. Will continue daily weights (395 today).  -- Has improved 2/2 to antibiotic therapy. Having PICC line placed for continued IV abx at home.   CHF- 2D ECHO on 07/26/14 with EF 55-60%. Mild concentric hypertrophy. Aortic root dilation (72mm). Mild LA dilation.  --There is trace bilateral pitting edema to the knees. He received IV lasix yesterday and had good urine output. Leg edema improved   Hypotension: -- Resolved, has resumed b-blocker  CAD s/p stent 2000, NSTEMI 2013 treated medically -- On aspirin, Plavix, b-blocker, statin,  -- TC 121, LDL 69, HDL 42, trigs 48   Diabetes mellitus with retinopathy/neuropathy -- Per IM  Dispo- Will need to follow with Dr. Aundra Dubin in office (wife sees him). Refusing to see anyone else.    Signed, Perry Mount, PA-C 07/31/2014 7:46 AM  I have examined the patient and reviewed assessment and plan and discussed with patient.  Agree with above as stated.  Starting Coumadin.  Watch for urinary bleeding.  Aasha Dina S.

## 2014-08-01 DIAGNOSIS — R7881 Bacteremia: Secondary | ICD-10-CM

## 2014-08-01 LAB — PROTIME-INR
INR: 1.19 (ref 0.00–1.49)
PROTHROMBIN TIME: 15.1 s (ref 11.6–15.2)

## 2014-08-01 LAB — GLUCOSE, CAPILLARY
GLUCOSE-CAPILLARY: 189 mg/dL — AB (ref 70–99)
Glucose-Capillary: 122 mg/dL — ABNORMAL HIGH (ref 70–99)
Glucose-Capillary: 280 mg/dL — ABNORMAL HIGH (ref 70–99)
Glucose-Capillary: 109 mg/dL — ABNORMAL HIGH (ref 70–99)
Glucose-Capillary: 163 mg/dL — ABNORMAL HIGH (ref 70–99)

## 2014-08-01 LAB — URINE CULTURE
COLONY COUNT: NO GROWTH
CULTURE: NO GROWTH

## 2014-08-01 MED ORDER — WARFARIN SODIUM 10 MG PO TABS
10.0000 mg | ORAL_TABLET | Freq: Once | ORAL | Status: AC
  Administered 2014-08-01: 10 mg via ORAL
  Filled 2014-08-01: qty 1

## 2014-08-01 MED ORDER — AMOXICILLIN-POT CLAVULANATE 875-125 MG PO TABS
1.0000 | ORAL_TABLET | Freq: Two times a day (BID) | ORAL | Status: AC
  Administered 2014-08-01 – 2014-08-05 (×10): 1 via ORAL
  Filled 2014-08-01 (×11): qty 1

## 2014-08-01 MED ORDER — ENOXAPARIN SODIUM 150 MG/ML ~~LOC~~ SOLN
170.0000 mg | Freq: Two times a day (BID) | SUBCUTANEOUS | Status: DC
  Administered 2014-08-01: 170 mg via SUBCUTANEOUS
  Filled 2014-08-01 (×2): qty 2

## 2014-08-01 MED ORDER — FUROSEMIDE 10 MG/ML IJ SOLN
40.0000 mg | Freq: Once | INTRAMUSCULAR | Status: AC
  Administered 2014-08-01: 40 mg via INTRAVENOUS

## 2014-08-01 MED ORDER — HEPARIN SODIUM (PORCINE) 5000 UNIT/ML IJ SOLN
5000.0000 [IU] | Freq: Three times a day (TID) | INTRAMUSCULAR | Status: DC
  Administered 2014-08-01 – 2014-08-02 (×2): 5000 [IU] via SUBCUTANEOUS
  Filled 2014-08-01 (×4): qty 1

## 2014-08-01 NOTE — Care Management Note (Addendum)
  Page 2 of 2   08/07/2014     5:43:05 PM CARE MANAGEMENT NOTE 08/07/2014  Patient:  Charles [name], Charles [name]   Account Number:  [id number]  Date Initiated:  07/29/2014  Documentation initiated by:  MAYO,HENRIETTA  Subjective/Objective Assessment:   dx PNA; lives with spouse    PCP  Charles [name]     Action/Plan:   CM to follow for disposition needs   Anticipated DC Date:  08/05/2014   Anticipated DC Plan:  [place]  CM consult      Choice offered to / List presented to:  C-1 Patient        [place] arranged  HH-1 RN  [place]      [place].   Status of service:  Completed, signed off Medicare Important Message given?  YES (If response is "NO", the following Medicare IM given date fields will be blank) Date Medicare IM given:  08/05/2014 Medicare IM given by:  Charles [name] Date Additional Medicare IM given:  08/01/2014 Additional Medicare IM given by:  Charles [name]  Discharge Disposition:  [place]  Per UR Regulation:  Reviewed for med. necessity/level of care/duration of stay  If discussed at [place] of Stay Meetings, dates discussed:   07/30/2014  08/06/2014    Comments:  Charles Laster RN, BSN, MSHL, CCM  Nurse - Case Manager, (Unit [phone]  08/02/2014 Social:  From home with wife who is [name] retired Warden/ranger. Contact:  Savant,Charles Spouse     [phone] PT RECS:  PT Dispostion Plan:  Home with HHS - RN, PT  ([facility]/Charles [name] notified

## 2014-08-01 NOTE — Progress Notes (Signed)
UR completed Atha Muradyan K. Miner Koral, RN, BSN, [place], CCM  08/01/2014 3:46 PM

## 2014-08-01 NOTE — Discharge Instructions (Addendum)
Information on my medicine - Coumadin   (Warfarin)  This medication education was reviewed with me or my healthcare representative as part of my discharge preparation.  The pharmacist that spoke with me during my hospital stay was:  Tad Moore, [facility]  Why was Coumadin prescribed for you? Coumadin was prescribed for you because you have a blood clot or a medical condition that can cause an increased risk of forming blood clots. Blood clots can cause serious health problems by blocking the flow of blood to the heart, lung, or brain. Coumadin can prevent harmful blood clots from forming. As a reminder your indication for Coumadin is:   Stroke Prevention Because Of Atrial Fibrillation  What test will check on my response to Coumadin? While on Coumadin (warfarin) you will need to have an INR test regularly to ensure that your dose is keeping you in the desired range. The INR (international normalized ratio) number is calculated from the result of the laboratory test called prothrombin time (PT).  If an INR APPOINTMENT HAS NOT ALREADY BEEN MADE FOR YOU please schedule an appointment to have this lab work done by your health care provider within 7 days. Your INR goal is usually a number between:  2 to 3 or your provider may give you a more narrow range like 2-2.5.  Ask your health care provider during an office visit what your goal INR is.  What  do you need to  know  About  COUMADIN? Take Coumadin (warfarin) exactly as prescribed by your healthcare provider about the same time each day.  DO NOT stop taking without talking to the doctor who prescribed the medication.  Stopping without other blood clot prevention medication to take the place of Coumadin may increase your risk of developing a new clot or stroke.  Get refills before you run out.  What do you do if you miss a dose? If you miss a dose, take it as soon as you remember on the same day then continue your regularly scheduled regimen the next  day.  Do not take two doses of Coumadin at the same time.  Important Safety Information A possible side effect of Coumadin (Warfarin) is an increased risk of bleeding. You should call your healthcare provider right away if you experience any of the following:   Bleeding from an injury or your nose that does not stop.   Unusual colored urine (red or dark brown) or unusual colored stools (red or black).   Unusual bruising for unknown reasons.   A serious fall or if you hit your head (even if there is no bleeding).  Some foods or medicines interact with Coumadin (warfarin) and might alter your response to warfarin. To help avoid this:   Eat a balanced diet, maintaining a consistent amount of Vitamin K.   Notify your provider about major diet changes you plan to make.   Avoid alcohol or limit your intake to 1 drink for women and 2 drinks for men per day. (1 drink is 5 oz. wine, 12 oz. beer, or 1.5 oz. liquor.)  Make sure that ANY health care provider who prescribes medication for you knows that you are taking Coumadin (warfarin).  Also make sure the healthcare provider who is monitoring your Coumadin knows when you have started a new medication including herbals and non-prescription products.  Coumadin (Warfarin)  Major Drug Interactions  Increased Warfarin Effect Decreased Warfarin Effect  Alcohol (large quantities) Antibiotics (esp. Septra/Bactrim, Flagyl, Cipro) Amiodarone (Cordarone) Aspirin (  ASA) Cimetidine (Tagamet) Megestrol (Megace) NSAIDs (ibuprofen, naproxen, etc.) Piroxicam (Feldene) Propafenone (Rythmol SR) Propranolol (Inderal) Isoniazid (INH) Posaconazole (Noxafil) Barbiturates (Phenobarbital) Carbamazepine (Tegretol) Chlordiazepoxide (Librium) Cholestyramine (Questran) Griseofulvin Oral Contraceptives Rifampin Sucralfate (Carafate) Vitamin K   Coumadin (Warfarin) Major Herbal Interactions  Increased Warfarin Effect Decreased Warfarin Effect   Garlic Ginseng Ginkgo biloba Coenzyme Q10 Green tea St. Johns wort    Coumadin (Warfarin) FOOD Interactions  Eat a consistent number of servings per week of foods HIGH in Vitamin K (1 serving =  cup)  Collards (cooked, or boiled & drained) Kale (cooked, or boiled & drained) Mustard greens (cooked, or boiled & drained) Parsley *serving size only =  cup Spinach (cooked, or boiled & drained) Swiss chard (cooked, or boiled & drained) Turnip greens (cooked, or boiled & drained)  Eat a consistent number of servings per week of foods MEDIUM-HIGH in Vitamin K (1 serving = 1 cup)  Asparagus (cooked, or boiled & drained) Broccoli (cooked, boiled & drained, or raw & chopped) Brussel sprouts (cooked, or boiled & drained) *serving size only =  cup Lettuce, raw (green leaf, endive, romaine) Spinach, raw Turnip greens, raw & chopped   These websites have more information on Coumadin (warfarin):  FailFactory.se; VeganReport.com.au;  Foley Catheter Care A Foley catheter is a soft, flexible tube that is placed into the bladder to drain urine. A Foley catheter may be inserted if:  You leak urine or are not able to control when you urinate (urinary incontinence).  You are not able to urinate when you need to (urinary retention).  You had prostate surgery or surgery on the genitals.  You have certain medical conditions, such as multiple sclerosis, dementia, or a spinal cord injury. If you are going home with a Foley catheter in place, follow the instructions below. TAKING CARE OF THE CATHETER 1. Wash your hands with soap and water. 2. Using mild soap and warm water on a clean washcloth:  Clean the area on your body closest to the catheter insertion site using a circular motion, moving away from the catheter. Never wipe toward the catheter because this could sweep bacteria up into the urethra and cause infection.  Remove all traces of soap. Pat the area dry with a  clean towel. For males, reposition the foreskin. 3. Attach the catheter to your leg so there is no tension on the catheter. Use adhesive tape or a leg strap. If you are using adhesive tape, remove any sticky residue left behind by the previous tape you used. 4. Keep the drainage bag below the level of the bladder, but keep it off the floor. 5. Check throughout the day to be sure the catheter is working and urine is draining freely. Make sure the tubing does not become kinked. 6. Do not pull on the catheter or try to remove it. Pulling could damage internal tissues. TAKING CARE OF THE DRAINAGE BAGS You will be given two drainage bags to take home. One is a large overnight drainage bag, and the other is a smaller leg bag that fits underneath clothing. You may wear the overnight bag at any time, but you should never wear the smaller leg bag at night. Follow the instructions below for how to empty, change, and clean your drainage bags. Emptying the Drainage Bag You must empty your drainage bag when it is  - full or at least 2-3 times a day. 1. Wash your hands with soap and water. 2. Keep the drainage bag below your hips, below  the level of your bladder. This stops urine from going back into the tubing and into your bladder. 3. Hold the dirty bag over the toilet or a clean container. 4. Open the pour spout at the bottom of the bag and empty the urine into the toilet or container. Do not let the pour spout touch the toilet, container, or any other surface. Doing so can place bacteria on the bag, which can cause an infection. 5. Clean the pour spout with a gauze pad or cotton ball that has rubbing alcohol on it. 6. Close the pour spout. 7. Attach the bag to your leg with adhesive tape or a leg strap. 8. Wash your hands well. Changing the Drainage Bag Change your drainage bag once a month or sooner if it starts to smell bad or look dirty. Below are steps to follow when changing the drainage bag. 1. Wash  your hands with soap and water. 2. Pinch off the rubber catheter so that urine does not spill out. 3. Disconnect the catheter tube from the drainage tube at the connection valve. Do not let the tubes touch any surface. 4. Clean the end of the catheter tube with an alcohol wipe. Use a different alcohol wipe to clean the end of the drainage tube. 5. Connect the catheter tube to the drainage tube of the clean drainage bag. 6. Attach the new bag to the leg with adhesive tape or a leg strap. Avoid attaching the new bag too tightly. 7. Wash your hands well. Cleaning the Drainage Bag 1. Wash your hands with soap and water. 2. Wash the bag in warm, soapy water. 3. Rinse the bag thoroughly with warm water. 4. Fill the bag with a solution of white vinegar and water (1 cup vinegar to 1 qt warm water [.2 L vinegar to 1 L warm water]). Close the bag and soak it for 30 minutes in the solution. 5. Rinse the bag with warm water. 6. Hang the bag to dry with the pour spout open and hanging downward. 7. Store the clean bag (once it is dry) in a clean plastic bag. 8. Wash your hands well. PREVENTING INFECTION  Wash your hands before and after handling your catheter.  Take showers daily and wash the area where the catheter enters your body. Do not take baths. Replace wet leg straps with dry ones, if this applies.  Do not use powders, sprays, or lotions on the genital area. Only use creams, lotions, or ointments as directed by your caregiver.  For females, wipe from front to back after each bowel movement.  Drink enough fluids to keep your urine clear or pale yellow unless you have a fluid restriction.  Do not let the drainage bag or tubing touch or lie on the floor.  Wear cotton underwear to absorb moisture and to keep your skin drier. SEEK MEDICAL CARE IF:   Your urine is cloudy or smells unusually bad.  Your catheter becomes clogged.  You are not draining urine into the bag or your bladder feels  full.  Your catheter starts to leak. SEEK IMMEDIATE MEDICAL CARE IF:   You have pain, swelling, redness, or pus where the catheter enters the body.  You have pain in the abdomen, legs, lower back, or bladder.  You have a fever.  You see blood fill the catheter, or your urine is pink or red.  You have nausea, vomiting, or chills.  Your catheter gets pulled out. MAKE SURE YOU:   Understand these  instructions.  Will watch your condition.  Will get help right away if you are not doing well or get worse. Document Released: 11/29/2005 Document Revised: 04/15/2014 Document Reviewed: 11/20/2012 [facility] Patient Information 2015 [place], [place]. This information is not intended to replace advice given to you by your health care provider. Make sure you discuss any questions you have with your health care provider.

## 2014-08-01 NOTE — Progress Notes (Signed)
[place] for Lovenxo and Coumadin Indication: atrial fibrillation  Allergies  Allergen Reactions  . Xarelto [Rivaroxaban]     rash    Patient Measurements: Height: 6\' 4"  (193 cm) Weight: 379 lb 14.4 oz (172.322 kg) (scale c) IBW/kg (Calculated) : 86.8  Vital Signs: Temp: 97.5 F (36.4 C) (08/20 0649) Temp src: Oral (08/20 0649) BP: 121/64 mmHg (08/20 0649) Pulse Rate: 87 (08/20 0649)  Labs:  Recent Labs  07/30/14 0300 07/31/14 0310 07/31/14 1230 08/01/14 0500  HGB 10.7* 11.3*  --   --   HCT 33.0* 34.7*  --   --   PLT 310 305  --   --   LABPROT  --   --  14.1 15.1  INR  --   --  1.09 1.19  CREATININE 0.76 0.92  --   --     Estimated Creatinine Clearance: 124.2 ml/min (by C-G formula based on Cr of 0.92).   Medical History: Past Medical History  Diagnosis Date  . CAD (coronary artery disease)     a. Approx. 2000 - MI. Cath showed single vessel disease, PTCA dLAD/medical management. ;  b. NSTEMI 11/13 => LHC: prox and mid LAD 30%, dLAD 60%, pCFX 30%, inf branch of OM 40%, mRCA 50-60%, EF 55-60%; IVUS attempted for RCA but not successful; anatomy felt stable from 2000 => med Rx.  . Diverticulitis   . Hypertension   . Morbid obesity   . CVA (cerebral infarction)     a. Approx. 2002  . Cholelithiasis   . Hypercholesterolemia   . ED (erectile dysfunction)   . OSA on CPAP   . Type II diabetes mellitus   . History of stomach ulcers   . History of MRSA infection     "little toe right foot" (10/26/2012)  . Tunnel vision     "both eyes since stroke"  . Claustrophobia   . B12 deficiency   . Diabetic neuropathy   . PAF (paroxysmal atrial fibrillation)     a. confirmed by event monitor. b. 02/2014 rash on Coumadin, patient decided to discontinue Xarelto due to possible rash, cost and lawyers ads on TV, agreed to take Plavix.    Medications:  Prescriptions prior to admission  Medication Sig Dispense Refill  . acetaminophen  (TYLENOL) 500 MG tablet Take 1,000 mg by mouth at bedtime.      Marland Kitchen aspirin 81 MG tablet Take 81 mg by mouth daily.        . Cholecalciferol (EQL VITAMIN D3) 1000 UNITS tablet Take 1,000 Units by mouth daily.        . clopidogrel (PLAVIX) 75 MG tablet Take 1 tablet (75 mg total) by mouth daily.  90 tablet  3  . Dextromethorphan HBr (VICKS DAYQUIL COUGH) 15 MG/15ML LIQD Take 15 mLs by mouth daily as needed (cough).      . furosemide (LASIX) 80 MG tablet Take 80 mg by mouth once a week.      . gabapentin (NEURONTIN) 300 MG capsule Take 300 mg by mouth 2 (two) times daily.      Marland Kitchen glimepiride (AMARYL) 4 MG tablet Take 1 tablet (4 mg total) by mouth 2 (two) times daily.  180 tablet  3  . guaiFENesin (MUCINEX) 600 MG 12 hr tablet Take 600 mg by mouth 2 (two) times daily.      Marland Kitchen ibuprofen (ADVIL,MOTRIN) 200 MG tablet Take 400 mg by mouth every 8 (eight) hours as needed for fever.      Marland Kitchen  insulin lispro (HUMALOG KWIKPEN) 100 UNIT/ML KiwkPen Inject 5-20 Units into the skin 4 (four) times daily - after meals and at bedtime. Per sliding scale      . losartan (COZAAR) 100 MG tablet Take 1 tablet (100 mg total) by mouth daily.  90 tablet  3  . metoprolol (LOPRESSOR) 50 MG tablet Take 1 tablet (50 mg total) by mouth 2 (two) times daily.  180 tablet  3  . pioglitazone (ACTOS) 45 MG tablet Take 1 tablet (45 mg total) by mouth daily.  90 tablet  3  . pravastatin (PRAVACHOL) 40 MG tablet Take 1 tablet (40 mg total) by mouth daily.  90 tablet  3  . Pseudoeph-Doxylamine-DM-APAP (NYQUIL PO) Take 15 mLs by mouth daily as needed (cough).      . triamcinolone cream (KENALOG) 0.5 % Apply 1 application topically 2 (two) times daily as needed (rash).      . vitamin B-12 (CYANOCOBALAMIN) 1000 MCG tablet Take 1,000 mcg by mouth daily.        . nitroGLYCERIN (NITROSTAT) 0.4 MG SL tablet Place 0.4 mg under the tongue every 5 (five) minutes as needed. For chest pain        Assessment: 73 y.o. male presented 8/13 with SOB. Known  to pharmacy from abx dosing this admit. Noted pt in/out of afib with RVR. Has been on Xarelto in the past but developed rash so no longer taking. Pt has agreed to start coumadin with lovenox bridge. No recent INR. CBC stable this morning. SCr remains stable with CrCl > 100 ml/min. Noted pt wt = 179 kg.  Goal of Therapy:  Anti-Xa level 0.6-1 units/ml 4hrs after LMWH dose given; INR 2-3 Monitor platelets by anticoagulation protocol: Yes   Plan:  Repeat Coumadin 10 mg po x 1 Continue Lovenox Follow up AM labs  Thank you. Anette Guarneri, PharmD [phone]  08/01/2014,10:53 AM

## 2014-08-01 NOTE — Evaluation (Signed)
Physical Therapy Evaluation Patient Details Name: Charles [name] MRN: [id number] DOB: August 21, 1941 Today's Date: 08/01/2014   History of Present Illness  73 y.o. male admitted to [facility] on 07/25/14 with SOB. He was dx with sepsis due to CAP and H Flu bacteremia.  He has also had issues with urinary retention and sustained foley trauma in which the urologist was consulted to put in a Coude catheter.  Pt with significant Pmhx of CAD, HTN, CVA, DM 2, diabetic neuropathy, PAF, and multiple toe amputations.    Clinical Impression  Pt is generally weak and deconditioned, but is motivated to go home and has excellent support from his wife and son at home.  He gets 2/4 DOE with gait and O2 sats drop to 87% with mobility, but he rebounds quickly to 94% with seated rest on RA.  Pt would benefit from HHPT at discharge if agreeable to help with deconditioning and independence at home.  PT to follow acutely for deficits listed below.       Follow Up Recommendations Supervision for mobility/OOB;Home health PT    Equipment Recommendations  None recommended by PT    Recommendations for Other Services   NA     Precautions / Restrictions Precautions Precautions: Fall      Mobility  Bed Mobility Overal bed mobility: Needs Assistance Bed Mobility: Supine to Sit     Supine to sit: Mod assist     General bed mobility comments: Pt relying heavily on his wife's assist, yet, I also believe that she does not let him try to do things independently before helping him (she is a natural caregiver as an Therapist, sports).   Transfers Overall transfer level: Needs assistance Equipment used: Rolling walker (2 wheeled) Transfers: Sit to/from Stand Sit to Stand: Min assist         General transfer comment: From low bed, pt using momentum, but does still need min assist to boost trunk up over weak legs.  Pt continuing to reach for his wife to help pull him up.   Ambulation/Gait Ambulation/Gait assistance: Min  guard Ambulation Distance (Feet): 100 Feet Assistive device: Rolling walker (2 wheeled) Gait Pattern/deviations: Step-through pattern;Trunk flexed     General Gait Details: Pt needs cues for safe RW use as when he truns he starts getting his feet outside of the RW and then ends up kicking and tripping over the legs. DOE 2/4 with gait and O2 sats decreased to 87% on RA during gait.  Pt blames the droplet mask he had to weak out of the room, and does maintain sats over 94% at rest in his room.          Balance Overall balance assessment: Needs assistance Sitting-balance support: Feet supported;No upper extremity supported;Bilateral upper extremity supported Sitting balance-Leahy Scale: Good     Standing balance support: Bilateral upper extremity supported Standing balance-Leahy Scale: Fair                               Pertinent Vitals/Pain Pain Assessment: No/denies pain    Home Living Family/patient expects to be discharged to:: Private residence Living Arrangements: Spouse/significant other (wife is an Therapist, sports) Available Help at Discharge: Family;Available 24 hours/day Type of Home: House Home Access: Stairs to enter Entrance Stairs-Rails: None (wife reports his son and she can help) Entrance Stairs-Number of Steps: 2 Home Layout: Two level;Able to live on main level with bedroom/bathroom (son lives upstairs, he does not go  up there) Home Equipment: Walker - 2 wheels;Cane - single point Additional Comments: Pt reports he does not use O2 at home    Prior Function     Gait / Transfers Assistance Needed: PTA ambulated outside with a cane  ADL's / [place] Needed: wife (A) with all adls. pt requires wife to complete [facility] ADLS at baseline        Hand Dominance   Dominant Hand: Right    Extremity/Trunk Assessment   Upper Extremity Assessment: Defer to OT evaluation           Lower Extremity Assessment: Generalized weakness      Cervical /  Trunk Assessment: Kyphotic  Communication   Communication: HOH  Cognition Arousal/Alertness: Awake/alert Behavior During Therapy: WFL for tasks assessed/performed Overall Cognitive Status: Within Functional Limits for tasks assessed                               Assessment/Plan    PT Assessment Patient needs continued PT services  PT Diagnosis Difficulty walking;Abnormality of gait;Generalized weakness   PT Problem List Decreased strength;Decreased activity tolerance;Decreased balance;Decreased mobility;Obesity  PT Treatment Interventions DME instruction;Gait training;Functional mobility training;Therapeutic activities;Therapeutic exercise;Stair training;Balance training;Neuromuscular re-education;Patient/family education   PT Goals (Current goals can be found in the Care Plan section) Acute Rehab PT Goals Patient Stated Goal: to go home PT Goal Formulation: With patient/family Time For Goal Achievement: 08/15/14 Potential to Achieve Goals: Good    Frequency Min 3X/week    End of Session   Activity Tolerance: Patient limited by fatigue Patient left: in bed;with call bell/phone within reach;with family/visitor present Nurse Communication: Other (comment) (left O2 off)         Time: 9373-4287 PT Time Calculation (min): 28 min   Charges:   PT Evaluation $Initial PT Evaluation Tier I: 1 Procedure PT Treatments $Gait Training: 8-22 mins        Britteney Ayotte B. [place], [place], DPT [phone]   08/01/2014, 4:25 PM

## 2014-08-01 NOTE — Progress Notes (Addendum)
Progress Note  Charles [name] PPI:[id number] DOB: 1941/09/07 DOA: 07/25/2014 PCP: [name], MD  Admit HPI / Brief Narrative: 73 y.o. M Hx Paroxysmal atrial fibrillation, obstructive sleep apnea, Hx stroke, Hx CAD, DM Type 2 with neuropathy, and retinopathy, HTN, HLD.  Endorsed was in his usual state of health when he started developing minimal shortness of breath. He reported "cold like symptoms". Over the next 2-3 days his symptoms progressively worsened. He did not have any cough initially but started coughing 24 hours prior to presentation and developed fever up to 101.52F. On the morning of admisison he woke up to go to the bathroom and became very short of breath, even with minimal exertion. He normally uses a CPAP every night and despite the use of CPAP he did not feel any better. His wife gave him her oxygen and one of her inhalers without any relief. Since his symptoms were not improving they called his primary care physician, who recommended that they call 911. When EMS arrived at the house he was found to be in respiratory distress. He was found to be cold, clammy, diaphoretic. Saturations initially were in 72%. He was placed on a nonrebreather and subsequently, on BiPAP. Was given Nebulizer treatments, steroids, and, he was brought into the emergency department.   In the emergency department it was noted that his blood pressure had been dropping. He was also tachycardic. Patient denied any dizziness, or any chest pain, per se. He was experiencing pleuritic type chest discomfort with coughing.  HPI/Subjective: Complains of significant swelling of his legs, external genitalia and abdominal region. Requesting Lasix-helped earlier. No chest pain or dyspnea  Assessment/Plan:  Hematuria/severe urinary urgency -suspect foley trauma due to accidental slippage of catheter into urethra after placement -appreciate assistance of Urology/Dr. Bonney Leitz' catheter placed under cystoscopy  8/18 and to remain in place for at least 10 days -pt needs to follow up with Dr. Tresa Moore for voiding trial as outpt -still with some older looking maroon colored blood in catheter -post catheter UA abnormal but fu cx negative - Monitor for worsening hematuria while Coumadin being started.  Sepsis due to CAP and H Flu Bacteremia / ESBL negative -Patient on antibiotics since 8/13. Received single dose of azithromycin, Rocephin and levofloxacin on 8/13. Completed 3 days of vancomycin 8/13-8/15. Has been on IV Zosyn since 8/14 (6 days). -BP soft in setting of RVR but o/w hemodynamically stable - Cortisol normal -peak lactic acid 4.7 but has normalized -PCT peaked at 2.76 -ID consulted and no indication for TEE for bacteremia -blood cx's repeated 8/19-negative to date -  discussed with infectious disease M.D. on call on 8/20 and switched antibiotics to oral Augmentin to complete additional 5 days treatment.   Acute respiratory failure with hypoxia due to Haemophilus influenza CAP and acute COPD exac -cont current anbx's  -weaning oxygen slowly -Continue flutter valve  -Taper Prednisone to 20 mg daily 8/19-have been decreasing by 10 mg daily -cont Xopenex PRN/DuoNeb QID -Continue O2 to maintain SpO2 > 93%   -cont Flutter valve -f/u CXR in AM  HYPERTENSION with concentric hypertrophy -Continue metoprolol; continue hold other hypertensive medication.   -8/17: Was hypotensive when VR 130-170 but SBP back up to 130s with controlled VR so IVFs to kvo -marked decrease in edema especially penile/scrotal edema after 1x dose IV Lasix- Lasix at home was weekly dosed  PAF (paroxysmal atrial fibrillation)  -Cards had signed off due to rhythm stability but returned 8/17 due to RVR -rate now  controlled on q 6 hour Lopressor -Cardiology has spoken extensively with the pt who now agrees to begin Coumadin 8/19  -Was on Plavix and ASA as prior to admit for AF and CAD. Cardiology discontinued Plavix and  Lovenox bridge secondary to hematuria. Heparin added for DVT prophylaxis. Continue Coumadin. Monitor CBC closely. -TSH/Free T4 normal  MYOCARDIAL INFARCTION, HX OF/CORONARY ARTERY DISEASE  -per Cards  -Echocardiogram shows a mild left atrial dilation and mild left ventricle hypertrophy.    Type 2 diabetes, uncontrolled, with retinopathy and neuropathy  -Continue patient's customized insulin regimen except for:  Lunch NovoLog 12 units  Resistant SSI -CBG improved continue hold Actos and Amaryl secondary to multiple episodes of hypoglycemia -Cont home regimen since this was the only one which kept his hemoglobin A1c close to being within ADA guidelines. -CBGs finally < 200 since Prednisone tapered but has periodic fluctuations. Monitor without change in regimen.   History of CVA (cerebrovascular accident)  HLD   -Continue Zocor 40 mg daily  -lipid panel this admit within ADA guidelines  Anasarca - Likely secondary to volume resuscitation early on admission and hypoalbuminemia. When necessary doses of IV Lasix and monitor.  Code Status: FULL Family Communication: Discussed with spouse 08/01/14. Disposition Plan:  discharge home when medically stable-possibly in the next 48 hours.   Consultants: Dr. Doren Custard Nahser(cardiology)  Dr. Scharlene Gloss (Infectious disease)  Procedure/Significant Events: 8/14 echocardiogram Left ventricle: mild concentric hypertrophy. LVEF=55% to 60%. -Ascending aorta: ascending aorta was mildly dilated. - Left atrium: mildly dilated.  Culture 8/13 blood right arm/hand positive for (GNR) HAEMOPHILUS INFLUENZAE/ESBL negative 8/13 MRSA PCR negative  8/14 urine negative  Antibiotics: Azithromycin 8/13 >>> stopped 8/14  Ceftriaxone 8/13 >>> stopped 8/14  Levofloxacin x1 dose  Vancomycin 8./13>> stopped 8/16 Zosyn 8/14>> 8/20   oral Augmentin 8/20 >  DVT prophylaxis: Lovenox dc'd 8/18 due to hematuria SCDs  Objective: VITAL SIGNS: Temp: 97.5 F  (36.4 C) (08/20 0649) Temp src: Oral (08/20 0649) BP: 131/55 mmHg (08/20 1102) Pulse Rate: 87 (08/20 1102) SPO2; 97% on 3 L via [place]  Intake/Output Summary (Last 24 hours) at 08/01/14 1145 Last data filed at 08/01/14 0853  Gross per 24 hour  Intake    850 ml  Output   2400 ml  Net  -1550 ml   Exam: General: No resting respiratory distress. Sitting up comfortably on a reclining chair.  Lungs:  reduced breath sounds in bases with few fine crackles but otherwise clear to auscultation.  Cardiovascular: regular rate without murmur gallop or rub normal S1 and S2. Telemetry: Sinus rhythm with PACs.  Abdomen: Nontender, nondistended, soft, bowel sounds positive, no rebound, no ascites, no appreciable mass.? Anterior abdominal wall edema. GU: Foley in place with maroon colored blood tinged urine but no clots Extremities: No significant cyanosis, clubbing. Bilateral 2+ pitting edema to groin bilaterally, mild scrotal edema.   Data Reviewed: Basic Metabolic Panel:  Recent Labs Lab 07/27/14 0315 07/28/14 0313 07/29/14 0322 07/30/14 0300 07/31/14 0310  NA 140 139 138 141 144  K 4.2 4.1 4.1 3.8 4.4  CL 105 104 102 101 101  CO2 25 26 27 30  34*  GLUCOSE 67* 126* 304* 136* 122*  BUN 28* 19 18 17 17   CREATININE 0.82 0.75 0.77 0.76 0.92  CALCIUM 8.5 8.8 8.7 8.7 8.9  MG  --  2.1 2.0  --   --    Liver Function Tests:  Recent Labs Lab 07/26/14 0113 07/28/14 0313 07/29/14 0322  AST 19 25  17  ALT 11 14 13   ALKPHOS 68 58 61  BILITOT 0.6 0.3 0.2*  PROT 5.8* 6.0 5.8*  ALBUMIN 2.4* 2.3* 2.3*   CBC:  Recent Labs Lab 07/25/14 1204  07/27/14 0315 07/28/14 0313 07/29/14 0322 07/30/14 0300 07/31/14 0310  WBC 9.5  < > 13.1* 14.1* 15.7* 13.5* 14.1*  NEUTROABS 8.3*  --   --  12.9* 14.3*  --   --   HGB 11.6*  < > 11.1* 10.4* 10.3* 10.7* 11.3*  HCT 36.8*  < > 35.0* 32.8* 32.8* 33.0* 34.7*  MCV 97.1  < > 98.0 95.3 95.9 94.6 95.6  PLT 264  < > 252 296 306 310 305  < > = values in this  interval not displayed.  Cardiac Enzymes:  Recent Labs Lab 07/25/14 2115 07/26/14 0113 07/26/14 0737  TROPONINI <0.30 <0.30 <0.30   BNP (last 3 results)  Recent Labs  07/25/14 1209  PROBNP 1647.0*   CBG:  Recent Labs Lab 07/31/14 2113 07/31/14 2252 08/01/14 0401 08/01/14 0744 08/01/14 1126  GLUCAP 170* 144* 122* 189* 280*    Recent Results (from the past 240 hour(s))  CULTURE, BLOOD (ROUTINE X 2)     Status: None   Collection Time    07/25/14  1:40 PM      Result Value Ref Range Status   Specimen Description BLOOD ARM RIGHT   Final   Special Requests BOTTLES DRAWN AEROBIC AND ANAEROBIC 10CC   Final   Culture  Setup Time     Final   Value: 07/25/2014 17:26     Performed at [facility]   Culture     Final   Value: HAEMOPHILUS INFLUENZAE     Note: BETA LACTAMASE NEGATIVE     Note: Gram Stain Report Called to,Read Back By and Verified With: Gus Height RN [phone]     Performed at [facility]   Report Status 07/27/2014 FINAL   Final  CULTURE, BLOOD (ROUTINE X 2)     Status: None   Collection Time    07/25/14  1:50 PM      Result Value Ref Range Status   Specimen Description BLOOD HAND RIGHT   Final   Special Requests BOTTLES DRAWN AEROBIC AND ANAEROBIC 10CC   Final   Culture  Setup Time     Final   Value: 07/25/2014 17:26     Performed at [facility]   Culture     Final   Value: HAEMOPHILUS INFLUENZAE     Note: BETA LACTAMASE NEGATIVE     Note: Gram Stain Report Called to,Read Back By and Verified With: Gus Height RN [phone]     Performed at [facility]   Report Status 07/27/2014 FINAL   Final  MRSA PCR SCREENING     Status: None   Collection Time    07/25/14  7:10 PM      Result Value Ref Range Status   MRSA by PCR NEGATIVE  NEGATIVE Final   Comment:            The GeneXpert MRSA Assay (FDA     approved for NASAL specimens     only), is one component of a     comprehensive MRSA colonization     surveillance program.  It is not     intended to diagnose MRSA     infection nor to guide or     monitor treatment for     MRSA infections.  URINE  CULTURE     Status: None   Collection Time    07/26/14  3:00 PM      Result Value Ref Range Status   Specimen Description URINE, CLEAN CATCH   Final   Special Requests NONE   Final   Culture  Setup Time     Final   Value: 07/26/2014 18:59     Performed at [facility]     Final   Value: 1,000 COLONIES/ML     Performed at [facility]   Culture     Final   Value: INSIGNIFICANT GROWTH     Performed at [facility]   Report Status 07/27/2014 FINAL   Final  URINE CULTURE     Status: None   Collection Time    07/30/14  6:27 PM      Result Value Ref Range Status   Specimen Description URINE, CATHETERIZED   Final   Special Requests NONE   Final   Culture  Setup Time     Final   Value: 07/30/2014 20:41     Performed at [facility]     Final   Value: NO GROWTH     Performed at [facility]   Culture     Final   Value: NO GROWTH     Performed at [facility]   Report Status 08/01/2014 FINAL   Final  CULTURE, BLOOD (ROUTINE X 2)     Status: None   Collection Time    07/31/14  3:10 AM      Result Value Ref Range Status   Specimen Description BLOOD RIGHT ARM   Final   Special Requests BOTTLES DRAWN AEROBIC AND ANAEROBIC 5CC EACH   Final   Culture  Setup Time     Final   Value: 07/31/2014 08:52     Performed at [facility]   Culture     Final   Value:        BLOOD CULTURE RECEIVED NO GROWTH TO DATE CULTURE WILL BE HELD FOR 5 DAYS BEFORE ISSUING A FINAL NEGATIVE REPORT     Performed at [facility]   Report Status PENDING   Incomplete  CULTURE, BLOOD (ROUTINE X 2)     Status: None   Collection Time    07/31/14  3:25 AM      Result Value Ref Range Status   Specimen Description BLOOD RIGHT HAND   Final   Special Requests BOTTLES DRAWN AEROBIC AND ANAEROBIC 5CC  EACH   Final   Culture  Setup Time     Final   Value: 07/31/2014 08:52     Performed at [facility]   Culture     Final   Value:        BLOOD CULTURE RECEIVED NO GROWTH TO DATE CULTURE WILL BE HELD FOR 5 DAYS BEFORE ISSUING A FINAL NEGATIVE REPORT     Performed at [facility]   Report Status PENDING   Incomplete     Studies:  Recent x-ray studies have been reviewed in detail by the Attending Physician  Scheduled Meds:  Scheduled Meds: . amoxicillin-clavulanate  1 tablet Oral Q12H  . aspirin EC  81 mg Oral Daily  . gabapentin  300 mg Oral BID  . heparin subcutaneous  5,000 Units Subcutaneous 3 times per day  . insulin aspart  0-20 Units Subcutaneous 6 times per day  .  insulin aspart  10 Units Subcutaneous BID AC & HS  . insulin aspart  12 Units Subcutaneous QAC supper  . insulin aspart  12 Units Subcutaneous QAC lunch  . levalbuterol  0.63 mg Nebulization TID  . losartan  100 mg Oral Daily  . metoprolol  25 mg Oral Q6H  . phenazopyridine  200 mg Oral TID WC  . predniSONE  20 mg Oral Q breakfast  . simvastatin  20 mg Oral q1800  . sodium chloride  3 mL Intravenous Q12H  . warfarin  10 mg Oral ONCE-1800  . Warfarin - Pharmacist Dosing Inpatient   Does not apply q1800   Time spent on care of this patient: 47 mins  Dehaven Sine, MD, FACP, FHM. [facility] Pager [phone]  If 7PM-7AM, please contact night-coverage [email or web address] Password [email or web address] 08/01/2014, 11:53 AM   LOS: 7 days   I have personally examined this patient and reviewed the entire database. I have reviewed the above note, made any necessary editorial changes, and agree with its content.

## 2014-08-01 NOTE — Plan of Care (Signed)
Problem: Phase I Progression Outcomes Goal: Hemodynamically stable Outcome: Progressing No acute events occurred this shift.  Patient remained stable without respiratory distress or hypotension.  He refused insulin and medications at times he thought was appropriate based on his vital signs and blood sugar at the corresponding time.  Will continue to monitor patient condition.

## 2014-08-01 NOTE — Evaluation (Signed)
Occupational Therapy Evaluation Patient Details Name: Charles [name] MRN: [id number] DOB: 1941-11-30 Today's Date: 08/01/2014    History of Present Illness 73 yo male admitted with hypoxic respiratory failure.    Clinical Impression   Patient evaluated by Occupational Therapy with no further acute OT needs identified. All education has been completed and the patient has no further questions. See below for any follow-up Occupational Therapy or equipment needs. OT to sign off. Thank you for referral.   Pt and wife plan to complete adls together at home. This is baseline level of care for patient. Ot to defer all further treatment to [facility]    Follow Up Recommendations  Home health OT    Equipment Recommendations  None recommended by OT    Recommendations for Other Services       Precautions / Restrictions Precautions Precautions: Fall Restrictions Weight Bearing Restrictions: No      Mobility Bed Mobility               General bed mobility comments: in chair on arrival  Transfers Overall transfer level: Needs assistance Equipment used: Rolling walker (2 wheeled) Transfers: Sit to/from Stand Sit to Stand: Supervision         General transfer comment: requires bil UE for transfer    Balance Overall balance assessment: Needs assistance         Standing balance support: Bilateral upper extremity supported;During functional activity Standing balance-Leahy Scale: Fair                              ADL Overall ADL's : Needs assistance/impaired Eating/Feeding: Independent;Sitting   Grooming: Wash/dry hands;Wash/dry face;Oral care;Applying deodorant;Modified independent;Sitting;Standing Grooming Details (indicate cue type and reason): Pt needed encouragement to engage in task. Pt asked "why can't my wife just do it" Upper Body Bathing: Supervision/ safety;Standing Upper Body Bathing Details (indicate cue type and reason): pt washing UB sink level   Lower Body Bathing: Maximal assistance;Sit to/from stand Lower Body Bathing Details (indicate cue type and reason): wife performing peri care. Pt declining to complete task Upper Body Dressing : Supervision/safety;Standing       Toilet Transfer: Supervision/safety;Ambulation;RW Toilet Transfer Details (indicate cue type and reason): pt requires a rocking momentum and bil UE to complete task         Functional mobility during ADLs: Min guard;Rolling walker General ADL Comments: Pt requires (A) to manage oxygen tubing. Pt states "can we just take this off ? I dont need it anyway" Pt allowed to remove only for face washing. pt likes to joke alot during session demonstrating feeling better and good spirits. pt with difficulty hearing therapist and unable to read lips due to vision loss / mask for air borne precautions     Vision                     Perception     Praxis      Pertinent Vitals/Pain Pain Assessment: No/denies pain     Hand Dominance Right   Extremity/Trunk Assessment Upper Extremity Assessment Upper Extremity Assessment: Overall WFL for tasks assessed   Lower Extremity Assessment Lower Extremity Assessment: Defer to PT evaluation   Cervical / Trunk Assessment Cervical / Trunk Assessment: Kyphotic   Communication Communication Communication: HOH   Cognition Arousal/Alertness: Awake/alert Behavior During Therapy: WFL for tasks assessed/performed Overall Cognitive Status: Within Functional Limits for tasks assessed  General Comments       Exercises       Shoulder Instructions      Home Living Family/patient expects to be discharged to:: Private residence Living Arrangements: Spouse/significant other Available Help at Discharge: Family               Bathroom Shower/Tub: Walk-in shower;Door   Bathroom Toilet: Handicapped height     Home Equipment: Environmental consultant - 2 wheels;Cane - single point          Prior  Functioning/Environment Level of Independence: Needs assistance;Independent with assistive device(s)  Gait / Transfers Assistance Needed: PTA ambulated outside with a cane ADL's / Homemaking Assistance Needed: wife (A) with all adls. pt requires wife to complete LB ADLS at baseline        OT Diagnosis: Generalized weakness   OT Problem List: Decreased strength;Decreased activity tolerance;Impaired balance (sitting and/or standing);Decreased safety awareness;Decreased knowledge of use of DME or AE;Decreased knowledge of precautions;Obesity;Pain;Cardiopulmonary status limiting activity   OT Treatment/Interventions:      OT Goals(Current goals can be found in the care plan section) Acute Rehab OT Goals Patient Stated Goal: to return home by saturday in the morning hours  OT Frequency:     Barriers to D/C:            Co-evaluation              End of Session Equipment Utilized During Treatment: Rolling walker Nurse Communication: Mobility status;Precautions  Activity Tolerance: Patient tolerated treatment well Patient left: in chair;with call bell/phone within reach;with family/visitor present   Time: 2035-5974 OT Time Calculation (min): 31 min Charges:  OT General Charges $OT Visit: 1 Procedure OT Evaluation $Initial OT Evaluation Tier I: 1 Procedure OT Treatments $Self Care/Home Management : 23-37 mins G-Codes:    Peri Maris Aug 23, 2014, 9:20 AM Pager: [phone]

## 2014-08-01 NOTE — Progress Notes (Signed)
Patient Profile: 73 year old male with history of CAD (MI 61), PAF, DM with peripheral neuropathy, OSA on CPAP, HTN, CVA (2002), and morbid obesity (BMI 42.62) who was brought to [facility] via EMS 2/2 hypoxic respiratory failure, fever, chills and felt to have CAP. Noted to be tachycardic and hypotensive in the ED which improved with IV fluids. Also with paroxsymal atrial fibrillation w/ RVR with possible additional SVT vs atrial flutter this admission.     Subjective: No complaints. Denies any awareness of arrhthymias.    Objective: Vital signs in last 24 hours: Temp:  [97.4 F (36.3 C)-99 F (37.2 C)] 97.5 F (36.4 C) (08/20 0649) Pulse Rate:  [72-87] 87 (08/20 0649) Resp:  [19-29] 20 (08/20 0649) BP: (109-149)/(40-87) 121/64 mmHg (08/20 0649) SpO2:  [93 %-100 %] 93 % (08/20 0649) Weight:  [343 lb 14.7 oz (156 kg)-379 lb 14.4 oz (172.322 kg)] 379 lb 14.4 oz (172.322 kg) (08/20 0758) Last BM Date: 07/30/14  Intake/Output from previous day: 08/19 0701 - 08/20 0700 In: 653 [P.O.:480; I.V.:73; IV Piggyback:100] Out: 2400 [Urine:2400] Intake/Output this shift:    Medications Current Facility-Administered Medications  Medication Dose Route Frequency Provider Last Rate Last Dose  . 0.9 %  sodium chloride infusion   Intravenous Continuous Samella Parr, NP 10 mL/hr at 07/29/14 1619    . acetaminophen (TYLENOL) tablet 650 mg  650 mg Oral Q6H PRN Bonnielee Haff, MD   650 mg at 07/30/14 1400   Or  . acetaminophen (TYLENOL) suppository 650 mg  650 mg Rectal Q6H PRN Bonnielee Haff, MD      . aspirin EC tablet 81 mg  81 mg Oral Daily Bonnielee Haff, MD   81 mg at 07/31/14 0918  . clopidogrel (PLAVIX) tablet 75 mg  75 mg Oral Daily Bonnielee Haff, MD   75 mg at 07/31/14 0919  . enoxaparin (LOVENOX) injection 180 mg  180 mg Subcutaneous Q12H Juanda Chance [place], RPH   180 mg at 07/31/14 2304  . gabapentin (NEURONTIN) capsule 300 mg  300 mg Oral BID Bonnielee Haff, MD   300 mg at 07/31/14 2304    . insulin aspart (novoLOG) injection 0-20 Units  0-20 Units Subcutaneous 6 times per day Allie Bossier, MD   4 Units at 08/01/14 0750  . insulin aspart (novoLOG) injection 10 Units  10 Units Subcutaneous BID AC & HS Allie Bossier, MD   10 Units at 08/01/14 0750  . insulin aspart (novoLOG) injection 12 Units  12 Units Subcutaneous QAC supper Allie Bossier, MD   12 Units at 07/31/14 1833  . insulin aspart (novoLOG) injection 12 Units  12 Units Subcutaneous QAC lunch Allie Bossier, MD   12 Units at 07/31/14 1314  . levalbuterol (XOPENEX) nebulizer solution 0.63 mg  0.63 mg Nebulization Q4H PRN Bonnielee Haff, MD   0.63 mg at 07/29/14 1755  . levalbuterol (XOPENEX) nebulizer solution 0.63 mg  0.63 mg Nebulization TID Allie Bossier, MD   0.63 mg at 07/31/14 2038  . losartan (COZAAR) tablet 100 mg  100 mg Oral Daily Samella Parr, NP   100 mg at 07/31/14 9417  . metoprolol (LOPRESSOR) injection 2.5 mg  2.5 mg Intravenous Q5 min PRN Samella Parr, NP   2.5 mg at 07/29/14 1055  . metoprolol tartrate (LOPRESSOR) tablet 25 mg  25 mg Oral Q6H Samella Parr, NP   25 mg at 07/31/14 2304  . morphine 2 MG/ML injection 2-4 mg  2-4 mg  Intravenous Q4H PRN Allie Bossier, MD   2 mg at 07/30/14 1801  . ondansetron (ZOFRAN) tablet 4 mg  4 mg Oral Q6H PRN Bonnielee Haff, MD       Or  . ondansetron [facility]) injection 4 mg  4 mg Intravenous Q6H PRN Bonnielee Haff, MD      . oxybutynin (DITROPAN) tablet 2.5 mg  2.5 mg Oral Q8H PRN Ritta Slot, NP   2.5 mg at 07/30/14 0736  . oxyCODONE (Oxy IR/ROXICODONE) immediate release tablet 5 mg  5 mg Oral Q4H PRN Bonnielee Haff, MD   5 mg at 07/31/14 2304  . phenazopyridine (PYRIDIUM) tablet 200 mg  200 mg Oral TID WC Samella Parr, NP   200 mg at 08/01/14 0644  . piperacillin-tazobactam (ZOSYN) IVPB 3.375 g  3.375 g Intravenous Q8H Thuy Dien Dang, RPH   3.375 g at 08/01/14 0500  . predniSONE (DELTASONE) tablet 20 mg  20 mg Oral Q breakfast Samella Parr, NP   20 mg  at 08/01/14 0644  . simvastatin (ZOCOR) tablet 20 mg  20 mg Oral q1800 Bonnielee Haff, MD   20 mg at 07/31/14 1835  . sodium chloride 0.9 % injection 3 mL  3 mL Intravenous Q12H Bonnielee Haff, MD   3 mL at 07/31/14 0916  . Warfarin - Pharmacist Dosing Inpatient   Does not apply q1800 Sindy Guadeloupe, [facility]        PE: General appearance: alert and morbidly obese Lungs: clear to auscultation bilaterally Heart: regular rate and rhythm Extremities: 2+ bilateral edema Pulses: 2+ and symmetric Skin: warm and dry Neurologic: Grossly normal  Lab Results:   Recent Labs  07/30/14 0300 07/31/14 0310  WBC 13.5* 14.1*  HGB 10.7* 11.3*  HCT 33.0* 34.7*  PLT 310 305   BMET  Recent Labs  07/30/14 0300 07/31/14 0310  NA 141 144  K 3.8 4.4  CL 101 101  CO2 30 34*  GLUCOSE 136* 122*  BUN 17 17  CREATININE 0.76 0.92  CALCIUM 8.7 8.9   PT/INR  Recent Labs  07/31/14 1230 08/01/14 0500  LABPROT 14.1 15.1  INR 1.09 1.19     Assessment/Plan  Active Problems:   HYPERTENSION   MYOCARDIAL INFARCTION, HX OF   CORONARY ARTERY DISEASE   History of CVA (cerebrovascular accident)   Type 2 diabetes, uncontrolled, with retinopathy   PAF (paroxysmal atrial fibrillation)   CAP (community acquired pneumonia)   Acute respiratory failure with hypoxia   Septic shock   Hypotension   COPD    1. Atrial arrhythmias: currently NSR in 90s.  - Appears that he has gone in and out of afib with RVR with possible additional SVT vs atrial flutter this admission - Continue Lopressor 25mg  q6 for rate control - Warfarin started yesterday for stroke prophylaxis.   2.CAP  -Has improved 2/2 to antibiotic therapy. Continue treatment per IM.   3.Hypotension:  -Resolved, has resumed b-blocker   4. CAD s/p stent 2000, NSTEMI 2013 treated medically  -On aspirin, Plavix, b-blocker, statin,  -TC 121, LDL 69, HDL 42, trigs 48   5. Diabetes mellitus with retinopathy/neuropathy  -per IM  6.  Anticoagulation:  Warfarin was started yesterday in the setting of PAF and CHADS2 score of 4 (HTN, DM and prior CVA). He is being bridged with Lovenox. He was also on ASA and Plavix prior to admission (h/o PCI with coronary stenting in 2000). Would recommend discontinuing Plavix to avoid risk for bleed as  stent placement was over 15 years ago. Also recommend checking a CBC to follow hgb as he is currently on ASA, Plavix, Warfarin and Lovenox. He also had hematuria this admission -suspect foley trauma due to accidental slippage of catheter into urethra after placement. Coude' catheter placed under cystoscopy 8/18. He still has red colored urine but is also on pyridium. Follow CBC to ensure he doesn't have recurrent hematuria in the setting of multiple blood thinners.      LOS: 7 days    Brittainy M. Rosita Fire, PA-C 08/01/2014 8:22 AM   I have examined the patient and reviewed assessment and plan and discussed with patient.  Agree with above as stated.  Stopped Lovenox and plavix.  COntinue with coumadin.  No recent PCI.  Urine is quite red.  WIll follow CBC.  Will add back Heparin SQ for DVT prophylaxis.  Mallie Linnemann S.

## 2014-08-02 LAB — BASIC METABOLIC PANEL
Anion gap: 7 (ref 5–15)
BUN: 15 mg/dL (ref 6–23)
CALCIUM: 8.8 mg/dL (ref 8.4–10.5)
CO2: 35 mEq/L — ABNORMAL HIGH (ref 19–32)
CREATININE: 0.72 mg/dL (ref 0.50–1.35)
Chloride: 101 mEq/L (ref 96–112)
GLUCOSE: 201 mg/dL — AB (ref 70–99)
Potassium: 3.4 mEq/L — ABNORMAL LOW (ref 3.7–5.3)
Sodium: 143 mEq/L (ref 137–147)

## 2014-08-02 LAB — GLUCOSE, CAPILLARY
GLUCOSE-CAPILLARY: 108 mg/dL — AB (ref 70–99)
GLUCOSE-CAPILLARY: 184 mg/dL — AB (ref 70–99)
GLUCOSE-CAPILLARY: 210 mg/dL — AB (ref 70–99)
GLUCOSE-CAPILLARY: 233 mg/dL — AB (ref 70–99)
Glucose-Capillary: 180 mg/dL — ABNORMAL HIGH (ref 70–99)
Glucose-Capillary: 191 mg/dL — ABNORMAL HIGH (ref 70–99)
Glucose-Capillary: 224 mg/dL — ABNORMAL HIGH (ref 70–99)
Glucose-Capillary: 269 mg/dL — ABNORMAL HIGH (ref 70–99)

## 2014-08-02 LAB — CBC
HEMATOCRIT: 34.8 % — AB (ref 39.0–52.0)
HEMOGLOBIN: 11 g/dL — AB (ref 13.0–17.0)
MCH: 30.9 pg (ref 26.0–34.0)
MCHC: 31.6 g/dL (ref 30.0–36.0)
MCV: 97.8 fL (ref 78.0–100.0)
Platelets: 300 10*3/uL (ref 150–400)
RBC: 3.56 MIL/uL — ABNORMAL LOW (ref 4.22–5.81)
RDW: 15.4 % (ref 11.5–15.5)
WBC: 8 10*3/uL (ref 4.0–10.5)

## 2014-08-02 LAB — PROTIME-INR
INR: 1.21 (ref 0.00–1.49)
Prothrombin Time: 15.3 seconds — ABNORMAL HIGH (ref 11.6–15.2)

## 2014-08-02 MED ORDER — OXYBUTYNIN CHLORIDE 5 MG PO TABS
10.0000 mg | ORAL_TABLET | Freq: Three times a day (TID) | ORAL | Status: DC | PRN
  Administered 2014-08-02 – 2014-08-03 (×2): 10 mg via ORAL
  Filled 2014-08-02 (×2): qty 2

## 2014-08-02 MED ORDER — INSULIN ASPART 100 UNIT/ML ~~LOC~~ SOLN
0.0000 [IU] | Freq: Three times a day (TID) | SUBCUTANEOUS | Status: DC
  Administered 2014-08-03: 2 [IU] via SUBCUTANEOUS
  Administered 2014-08-03: 5 [IU] via SUBCUTANEOUS
  Administered 2014-08-03: 3 [IU] via SUBCUTANEOUS
  Administered 2014-08-04: 5 [IU] via SUBCUTANEOUS
  Administered 2014-08-04: 2 [IU] via SUBCUTANEOUS
  Administered 2014-08-04: 11 [IU] via SUBCUTANEOUS
  Administered 2014-08-05: 5 [IU] via SUBCUTANEOUS
  Administered 2014-08-05: 3 [IU] via SUBCUTANEOUS
  Administered 2014-08-06: 5 [IU] via SUBCUTANEOUS

## 2014-08-02 MED ORDER — INSULIN ASPART 100 UNIT/ML ~~LOC~~ SOLN
0.0000 [IU] | Freq: Every day | SUBCUTANEOUS | Status: DC
  Administered 2014-08-02: 2 [IU] via SUBCUTANEOUS

## 2014-08-02 MED ORDER — PREDNISONE 10 MG PO TABS
10.0000 mg | ORAL_TABLET | Freq: Every day | ORAL | Status: AC
Start: 2014-08-03 — End: 2014-08-04
  Administered 2014-08-03 – 2014-08-04 (×2): 10 mg via ORAL
  Filled 2014-08-02 (×2): qty 1

## 2014-08-02 MED ORDER — FUROSEMIDE 10 MG/ML IJ SOLN
40.0000 mg | Freq: Every day | INTRAMUSCULAR | Status: DC
  Administered 2014-08-04 – 2014-08-06 (×3): 40 mg via INTRAVENOUS
  Filled 2014-08-02 (×5): qty 4

## 2014-08-02 MED ORDER — POTASSIUM CHLORIDE CRYS ER 20 MEQ PO TBCR
40.0000 meq | EXTENDED_RELEASE_TABLET | Freq: Once | ORAL | Status: AC
  Administered 2014-08-02: 40 meq via ORAL
  Filled 2014-08-02: qty 2

## 2014-08-02 MED ORDER — WARFARIN SODIUM 10 MG PO TABS
10.0000 mg | ORAL_TABLET | Freq: Once | ORAL | Status: DC
Start: 2014-08-02 — End: 2014-08-02
  Filled 2014-08-02: qty 1

## 2014-08-02 MED ORDER — INSULIN GLARGINE 100 UNIT/ML ~~LOC~~ SOLN
10.0000 [IU] | Freq: Every day | SUBCUTANEOUS | Status: DC
  Administered 2014-08-02 – 2014-08-04 (×2): 10 [IU] via SUBCUTANEOUS
  Filled 2014-08-02 (×5): qty 0.1

## 2014-08-02 NOTE — Progress Notes (Signed)
Bladder scanner showed 0.  MD notified. New orders given.  Will continue to monitor patient for further order in condition. Patient still continues to complain of pain.

## 2014-08-02 NOTE — Progress Notes (Signed)
[facility] Indication: atrial fibrillation  Allergies  Allergen Reactions  . Xarelto [Rivaroxaban]     rash    Patient Measurements: Height: 6\' 4"  (193 cm) Weight: 374 lb 4.8 oz (169.781 kg) (Scale C) IBW/kg (Calculated) : 86.8  Vital Signs: Temp: 98.7 F (37.1 C) (08/21 0609) Temp src: Oral (08/21 0609) BP: 135/71 mmHg (08/21 0609) Pulse Rate: 80 (08/21 0609)  Labs:  Recent Labs  07/31/14 0310 07/31/14 1230 08/01/14 0500 08/02/14 0605  HGB 11.3*  --   --  11.0*  HCT 34.7*  --   --  34.8*  PLT 305  --   --  300  LABPROT  --  14.1 15.1 15.3*  INR  --  1.09 1.19 1.21  CREATININE 0.92  --   --  0.72    Estimated Creatinine Clearance: 141.7 ml/min (by C-G formula based on Cr of 0.72).   Medical History: Past Medical History  Diagnosis Date  . CAD (coronary artery disease)     a. Approx. 2000 - MI. Cath showed single vessel disease, PTCA dLAD/medical management. ;  b. NSTEMI 11/13 => LHC: prox and mid LAD 30%, dLAD 60%, pCFX 30%, inf branch of OM 40%, mRCA 50-60%, EF 55-60%; IVUS attempted for RCA but not successful; anatomy felt stable from 2000 => med Rx.  . Diverticulitis   . Hypertension   . Morbid obesity   . CVA (cerebral infarction)     a. Approx. 2002  . Cholelithiasis   . Hypercholesterolemia   . ED (erectile dysfunction)   . OSA on CPAP   . Type II diabetes mellitus   . History of stomach ulcers   . History of MRSA infection     "little toe right foot" (10/26/2012)  . Tunnel vision     "both eyes since stroke"  . Claustrophobia   . B12 deficiency   . Diabetic neuropathy   . PAF (paroxysmal atrial fibrillation)     a. confirmed by event monitor. b. 02/2014 rash on Coumadin, patient decided to discontinue Xarelto due to possible rash, cost and lawyers ads on TV, agreed to take Plavix.    Medications:  Prescriptions prior to admission  Medication Sig Dispense Refill  . acetaminophen (TYLENOL) 500  MG tablet Take 1,000 mg by mouth at bedtime.      Marland Kitchen aspirin 81 MG tablet Take 81 mg by mouth daily.        . Cholecalciferol (EQL VITAMIN D3) 1000 UNITS tablet Take 1,000 Units by mouth daily.        . clopidogrel (PLAVIX) 75 MG tablet Take 1 tablet (75 mg total) by mouth daily.  90 tablet  3  . Dextromethorphan HBr (VICKS DAYQUIL COUGH) 15 MG/15ML LIQD Take 15 mLs by mouth daily as needed (cough).      . furosemide (LASIX) 80 MG tablet Take 80 mg by mouth once a week.      . gabapentin (NEURONTIN) 300 MG capsule Take 300 mg by mouth 2 (two) times daily.      Marland Kitchen glimepiride (AMARYL) 4 MG tablet Take 1 tablet (4 mg total) by mouth 2 (two) times daily.  180 tablet  3  . guaiFENesin (MUCINEX) 600 MG 12 hr tablet Take 600 mg by mouth 2 (two) times daily.      Marland Kitchen ibuprofen (ADVIL,MOTRIN) 200 MG tablet Take 400 mg by mouth every 8 (eight) hours as needed for fever.      . insulin lispro (  HUMALOG KWIKPEN) 100 UNIT/ML KiwkPen Inject 5-20 Units into the skin 4 (four) times daily - after meals and at bedtime. Per sliding scale      . losartan (COZAAR) 100 MG tablet Take 1 tablet (100 mg total) by mouth daily.  90 tablet  3  . metoprolol (LOPRESSOR) 50 MG tablet Take 1 tablet (50 mg total) by mouth 2 (two) times daily.  180 tablet  3  . pioglitazone (ACTOS) 45 MG tablet Take 1 tablet (45 mg total) by mouth daily.  90 tablet  3  . pravastatin (PRAVACHOL) 40 MG tablet Take 1 tablet (40 mg total) by mouth daily.  90 tablet  3  . Pseudoeph-Doxylamine-DM-APAP (NYQUIL PO) Take 15 mLs by mouth daily as needed (cough).      . triamcinolone cream (KENALOG) 0.5 % Apply 1 application topically 2 (two) times daily as needed (rash).      . vitamin B-12 (CYANOCOBALAMIN) 1000 MCG tablet Take 1,000 mcg by mouth daily.        . nitroGLYCERIN (NITROSTAT) 0.4 MG SL tablet Place 0.4 mg under the tongue every 5 (five) minutes as needed. For chest pain        Assessment: 73 y.o. male presented 8/13 with SOB. Known to pharmacy  from abx dosing this admit. Noted pt in/out of afib with RVR. Has been on Xarelto in the past but developed rash so no longer taking. Pt agreed to start coumadin. INR trending up slowly.  Goal of Therapy:   INR 2-3 Monitor platelets by anticoagulation protocol: Yes   Plan:  Repeat Coumadin 10 mg po x 1 D/c sq heparin when INR therapeutic Follow up AM labs and resolution of hematuria  Thank you. Excell Seltzer, PharmD [phone] 08/02/2014,8:22 AM

## 2014-08-02 NOTE — Progress Notes (Signed)
Subjective:  1 - Urethral Trauma / Urinary Retention - pt admitted for severe pneumonia with bacteremia noted to have malposiioned cahtere (suspect balloon in urethra) with minimal output and non-flushing therefore had urgent bedside cysto, catheter placement over wire, clot irrigation 07/30/2014.   Pt did well until 8/21 (today) at which time began having symptoms of retention again with foley in place. Urine clear until today. He is on heparin and coumadin for h/o AFib and CVA.    Objective: Vital signs in last 24 hours: Temp:  [97.9 F (36.6 C)-98.7 F (37.1 C)] 97.9 F (36.6 C) (08/21 1408) Pulse Rate:  [76-86] 85 (08/21 1633) Resp:  [18-22] 22 (08/21 1408) BP: (123-155)/(42-71) 155/62 mmHg (08/21 1633) SpO2:  [93 %-100 %] 96 % (08/21 1408) Weight:  [169.781 kg (374 lb 4.8 oz)] 169.781 kg (374 lb 4.8 oz) (08/21 0609) Last BM Date: 07/30/14  Intake/Output from previous day: 08/20 0701 - 08/21 0700 In: 660 [P.O.:660] Out: 4800 [Urine:4800] Intake/Output this shift: Total I/O In: 480 [P.O.:480] Out: 600 [Urine:600]  General appearance: alert, cooperative and wife and daughter at bedside. Pt in visible pain. Nose: Nares normal. Septum midline. Mucosa normal. No drainage or sinus tenderness. Throat: lips, mucosa, and tongue normal; teeth and gums normal Neck: supple, symmetrical, trachea midline Back: symmetric, no curvature. ROM normal. No CVA tenderness. Resp: non-labored on room air Cardio: Nl rate GI: morbid truncal obesity Male genitalia: normal, foley in place with some dark blood clto in tubing. SP TTP. Extremities: extremities normal, atraumatic, no cyanosis or edema Pulses: 2+ and symmetric Skin: Skin color, texture, turgor normal. No rashes or lesions Lymph nodes: Cervical, supraclavicular, and axillary nodes normal. Neurologic: Grossly normal  Using aseptic technique catheter irrigated and 800cc bloody urine with some clots drained with immediate releif of  pain. Next 3071mL strile water irrigated in 60cc aliquots to completely clear. Catheter then placed on "two strap traction" to place some mild tamponade pressure on bladder neck.  Lab Results:   Recent Labs  07/31/14 0310 08/02/14 0605  WBC 14.1* 8.0  HGB 11.3* 11.0*  HCT 34.7* 34.8*  PLT 305 300   BMET  Recent Labs  07/31/14 0310 08/02/14 0605  NA 144 143  K 4.4 3.4*  CL 101 101  CO2 34* 35*  GLUCOSE 122* 201*  BUN 17 15  CREATININE 0.92 0.72  CALCIUM 8.9 8.8   PT/INR  Recent Labs  08/01/14 0500 08/02/14 0605  LABPROT 15.1 15.3*  INR 1.19 1.21   ABG No results found for this basename: PHART, PCO2, PO2, HCO3,  in the last 72 hours  Studies/Results: No results found.  Anti-infectives: Anti-infectives   Start     Dose/Rate Route Frequency Ordered Stop   08/01/14 1000  amoxicillin-clavulanate (AUGMENTIN) 875-125 MG per tablet 1 tablet     1 tablet Oral Every 12 hours 08/01/14 0948 08/06/14 0959   07/26/14 2000  piperacillin-tazobactam (ZOSYN) IVPB 3.375 g  Status:  Discontinued     3.375 g 12.5 mL/hr over 240 Minutes Intravenous Every 8 hours 07/26/14 1900 08/01/14 0947   07/26/14 0600  vancomycin (VANCOCIN) 1,500 mg in sodium chloride 0.9 % 500 mL IVPB  Status:  Discontinued     1,500 mg 250 mL/hr over 120 Minutes Intravenous Every 12 hours 07/25/14 1617 07/28/14 0908   07/25/14 2200  azithromycin (ZITHROMAX) 500 mg in dextrose 5 % 250 mL IVPB  Status:  Discontinued     500 mg 250 mL/hr over 60 Minutes Intravenous Every 24  hours 07/25/14 1907 07/26/14 1902   07/25/14 2000  cefTRIAXone (ROCEPHIN) 1 g in dextrose 5 % 50 mL IVPB  Status:  Discontinued     1 g 100 mL/hr over 30 Minutes Intravenous Every 24 hours 07/25/14 1907 07/26/14 1902   07/25/14 1630  vancomycin (VANCOCIN) 2,500 mg in sodium chloride 0.9 % 500 mL IVPB     2,500 mg 250 mL/hr over 120 Minutes Intravenous STAT 07/25/14 1616 07/25/14 1830   07/25/14 1430  levofloxacin (LEVAQUIN) IVPB 750 mg      750 mg 100 mL/hr over 90 Minutes Intravenous  Once 07/25/14 1415 07/25/14 1611      Assessment/Plan:  1 - Urethral Trauma / Urinary Retention - now s/p recurrent retention episode likely due to some bulbar urethral back-bleeding from site of prior foley trauma in setting anticoagulation. Rec keep current catheter in place for at least 10 days and hold all blood thinners if possible as he is having significant acute bleeding issues. I discussed with pt and family risks of holding blood thinner such as increased CVA risk v. competint risk of recurrent urethral bleeding and they desire to hold blood thinners.  2 - Pt OK to DC home after 24 hrs grossly non-bloody urine. I am concerned that if we DC now he may wind up returning in clot retention again.  3 - Call with questions. I am not on call this weekend but on call MD can be reached at [phone] anytime.  [facility], Arshia Rondon 08/02/2014

## 2014-08-02 NOTE — Progress Notes (Signed)
Patient called out for pain medication. Still is in pain at this time.

## 2014-08-02 NOTE — Progress Notes (Signed)
Patient Profile: 73 year old male with history of CAD (MI 79), PAF, DM with peripheral neuropathy, OSA on CPAP, HTN, CVA (2002), and morbid obesity (BMI 42.62) who was brought to [facility] via EMS 2/2 hypoxic respiratory failure, fever, chills and felt to have CAP. Noted to be tachycardic and hypotensive in the ED which improved with IV fluids. Also with paroxsymal atrial fibrillation w/ RVR with possible additional SVT vs atrial flutter this admission.    Back in NSR.  Bladder issues including pain, spasms and hematuria predominate at this point.  Subjective: No complaints. Denies any awareness of arrhthymias.    Objective: Vital signs in last 24 hours: Temp:  [97.6 F (36.4 C)-98.7 F (37.1 C)] 98.7 F (37.1 C) (08/21 0609) Pulse Rate:  [74-87] 80 (08/21 0609) Resp:  [18-20] 20 (08/21 0609) BP: (109-142)/(47-71) 135/71 mmHg (08/21 0609) SpO2:  [93 %-100 %] 100 % (08/21 0609) Weight:  [374 lb 4.8 oz (169.781 kg)] 374 lb 4.8 oz (169.781 kg) (08/21 0609) Last BM Date: 07/30/14  Intake/Output from previous day: 08/20 0701 - 08/21 0700 In: 660 [P.O.:660] Out: 4800 [Urine:4800] Intake/Output this shift: Total I/O In: 120 [P.O.:120] Out: -   Medications Current Facility-Administered Medications  Medication Dose Route Frequency Provider Last Rate Last Dose  . acetaminophen (TYLENOL) tablet 650 mg  650 mg Oral Q6H PRN Bonnielee Haff, MD   650 mg at 07/30/14 1400   Or  . acetaminophen (TYLENOL) suppository 650 mg  650 mg Rectal Q6H PRN Bonnielee Haff, MD      . amoxicillin-clavulanate (AUGMENTIN) 875-125 MG per tablet 1 tablet  1 tablet Oral Q12H Modena Jansky, MD   1 tablet at 08/01/14 2227  . aspirin EC tablet 81 mg  81 mg Oral Daily Bonnielee Haff, MD   81 mg at 08/01/14 1101  . gabapentin (NEURONTIN) capsule 300 mg  300 mg Oral BID Bonnielee Haff, MD   300 mg at 08/01/14 2204  . insulin aspart (novoLOG) injection 0-20 Units  0-20 Units Subcutaneous 6 times per day Allie Bossier,  MD   7 Units at 08/02/14 0857  . insulin aspart (novoLOG) injection 10 Units  10 Units Subcutaneous BID AC & HS Allie Bossier, MD   10 Units at 08/02/14 [phone]  . insulin aspart (novoLOG) injection 12 Units  12 Units Subcutaneous QAC supper Allie Bossier, MD   12 Units at 07/31/14 1833  . insulin aspart (novoLOG) injection 12 Units  12 Units Subcutaneous QAC lunch Allie Bossier, MD   12 Units at 08/01/14 1205  . levalbuterol (XOPENEX) nebulizer solution 0.63 mg  0.63 mg Nebulization Q4H PRN Bonnielee Haff, MD   0.63 mg at 07/29/14 1755  . levalbuterol (XOPENEX) nebulizer solution 0.63 mg  0.63 mg Nebulization TID Allie Bossier, MD   0.63 mg at 08/02/14 0841  . losartan (COZAAR) tablet 100 mg  100 mg Oral Daily Samella Parr, NP   100 mg at 08/01/14 1102  . metoprolol (LOPRESSOR) injection 2.5 mg  2.5 mg Intravenous Q5 min PRN Samella Parr, NP   2.5 mg at 07/29/14 1055  . metoprolol tartrate (LOPRESSOR) tablet 25 mg  25 mg Oral Q6H Samella Parr, NP   25 mg at 08/02/14 6789  . morphine 2 MG/ML injection 2-4 mg  2-4 mg Intravenous Q4H PRN Allie Bossier, MD   2 mg at 07/30/14 1801  . ondansetron (ZOFRAN) tablet 4 mg  4 mg Oral Q6H PRN Bonnielee Haff, MD  Or  . ondansetron (ZOFRAN) injection 4 mg  4 mg Intravenous Q6H PRN Bonnielee Haff, MD      . oxybutynin (DITROPAN) tablet 10 mg  10 mg Oral Q8H PRN Alexis Frock, MD      . oxyCODONE (Oxy IR/ROXICODONE) immediate release tablet 5 mg  5 mg Oral Q4H PRN Bonnielee Haff, MD   5 mg at 08/01/14 2227  . phenazopyridine (PYRIDIUM) tablet 200 mg  200 mg Oral TID WC Modena Jansky, MD   200 mg at 08/02/14 7412  . predniSONE (DELTASONE) tablet 20 mg  20 mg Oral Q breakfast Samella Parr, NP   20 mg at 08/02/14 8786  . simvastatin (ZOCOR) tablet 20 mg  20 mg Oral q1800 Bonnielee Haff, MD   20 mg at 08/01/14 1708  . sodium chloride 0.9 % injection 3 mL  3 mL Intravenous Q12H Bonnielee Haff, MD   3 mL at 08/02/14 1000  . warfarin (COUMADIN)  tablet 10 mg  10 mg Oral ONCE-1800 Modena Jansky, MD      . Warfarin - Pharmacist Dosing Inpatient   Does not apply q1800 Sindy Guadeloupe, [facility]        PE: General appearance: alert and morbidly obese Lungs: clear to auscultation bilaterally Heart: regular rate and rhythm Extremities: 2+ bilateral edema Pulses: 2+ and symmetric Skin: warm and dry Neurologic: Grossly normal  Lab Results:   Recent Labs  07/31/14 0310 08/02/14 0605  WBC 14.1* 8.0  HGB 11.3* 11.0*  HCT 34.7* 34.8*  PLT 305 300   BMET  Recent Labs  07/31/14 0310 08/02/14 0605  NA 144 143  K 4.4 3.4*  CL 101 101  CO2 34* 35*  GLUCOSE 122* 201*  BUN 17 15  CREATININE 0.92 0.72  CALCIUM 8.9 8.8   PT/INR  Recent Labs  07/31/14 1230 08/01/14 0500 08/02/14 0605  LABPROT 14.1 15.1 15.3*  INR 1.09 1.19 1.21     Assessment/Plan  Active Problems:   HYPERTENSION   MYOCARDIAL INFARCTION, HX OF   CORONARY ARTERY DISEASE   History of CVA (cerebrovascular accident)   Type 2 diabetes, uncontrolled, with retinopathy   PAF (paroxysmal atrial fibrillation)   CAP (community acquired pneumonia)   Acute respiratory failure with hypoxia   Septic shock   Hypotension   COPD    1. Atrial arrhythmias: currently NSR in 90s.  - Appears that he has gone in and out of afib with RVR with possible additional SVT vs atrial flutter this admission - Continue Lopressor 25mg  q6 for rate control - Warfarin started for stroke prophylaxis.  OK to stop warfarin for now.  2.CAP  -Has improved 2/2 to antibiotic therapy. Continue treatment per IM.   3.Hypotension:  -Resolved, has resumed b-blocker   4. CAD s/p stent 2000, NSTEMI 2013 treated medically  -On aspirin,, b-blocker, statin,  -TC 121, LDL 69, HDL 42, trigs 48   5. Diabetes mellitus with retinopathy/neuropathy  -per IM  6. Anticoagulation:  Planning longterm anticoagulation with coumadin. Plavix stopped.  Lovenox stopped.  Hold all anticoagulation  until ok to restart by urology.  WOuld likely use COumadin alone without lovenox bridge.     LOS: 8 days    Charles Cassell S.  08/02/2014 10:14 AM

## 2014-08-02 NOTE — Progress Notes (Signed)
Report given to receiving RN. Patient in bed resting with family at the bedside. No verbal complaints and no signs or symptoms of distress or discomfort noted.

## 2014-08-02 NOTE — Progress Notes (Signed)
Notified MD that patient is complaining of pain. MD stated to bladder scan the patient and to call back with the results.

## 2014-08-02 NOTE — Progress Notes (Signed)
Progress Note  Charles [name] XIP:[id number] DOB: 1941/07/25 DOA: 07/25/2014 PCP: [name], MD  Admit HPI / Brief Narrative: 73 y.o. M Hx Paroxysmal atrial fibrillation, obstructive sleep apnea, Hx stroke, Hx CAD, DM Type 2 with neuropathy, and retinopathy, HTN, HLD.  Endorsed was in his usual state of health when he started developing minimal shortness of breath. He reported "cold like symptoms". Over the next 2-3 days his symptoms progressively worsened. He did not have any cough initially but started coughing 24 hours prior to presentation and developed fever up to 101.78F. On the morning of admisison he woke up to go to the bathroom and became very short of breath, even with minimal exertion. He normally uses a CPAP every night and despite the use of CPAP he did not feel any better. His wife gave him her oxygen and one of her inhalers without any relief. Since his symptoms were not improving they called his primary care physician, who recommended that they call 911. When EMS arrived at the house he was found to be in respiratory distress. He was found to be cold, clammy, diaphoretic. Saturations initially were in 72%. He was placed on a nonrebreather and subsequently, on BiPAP. Was given Nebulizer treatments, steroids, and, he was brought into the emergency department.   In the emergency department it was noted that his blood pressure had been dropping. He was also tachycardic. Patient denied any dizziness, or any chest pain, per se. He was experiencing pleuritic type chest discomfort with coughing.  HPI/Subjective: Difficulty and pain urinating since last night-several attempts by nursing to flush Foley catheter-blood clots noted. Bladder scan this morning with 0 residual.  Assessment/Plan:  Hematuria/severe urinary urgency -suspect foley trauma due to accidental slippage of catheter into urethra after placement -appreciate assistance of Urology/Dr. Bonney Leitz' catheter placed under  cystoscopy 8/18 and to remain in place for at least 10 days -pt needs to follow up with Dr. Tresa Moore for voiding trial as outpt -still with some older looking maroon colored blood in catheter -post catheter UA abnormal but fu cx negative - Due to ongoing hematuria and clots which are probably contributing to his urinary symptoms, discontinue all anticoagulants for now and monitor closely. Re consulted urology to follow.  Sepsis due to CAP and H Flu Bacteremia / ESBL negative -Patient on antibiotics since 8/13. Received single dose of azithromycin, Rocephin and levofloxacin on 8/13. Completed 3 days of vancomycin 8/13-8/15. Has been on IV Zosyn since 8/14 (6 days). -BP soft in setting of RVR but o/w hemodynamically stable - Cortisol normal -peak lactic acid 4.7 but has normalized -PCT peaked at 2.76 -ID consulted and no indication for TEE for bacteremia -blood cx's repeated 8/19-negative to date -  discussed with infectious disease M.D. on call on 8/20 and switched antibiotics to oral Augmentin to complete additional 5 days treatment.   Acute respiratory failure with hypoxia due to Haemophilus influenza CAP and acute COPD exac -cont current anbx's  - Able to wean off of oxygen. -Continue flutter valve  -Taper Prednisone to 20 mg daily 8/19-have been decreasing by 10 mg daily -cont Xopenex PRN/DuoNeb QID -Continue O2 to maintain SpO2 > 93%   -cont Flutter valve - Follow chest x-ray 8/22.  HYPERTENSION with concentric hypertrophy -Continue metoprolol; continue hold other hypertensive medication.   -8/17: Was hypotensive when VR 130-170 but SBP back up to 130s with controlled VR so IVFs to kvo -marked decrease in edema especially penile/scrotal edema after 1x dose IV Lasix- Lasix  at home was weekly dosed. Diuresing. Continue Lasix.  Atrial arrhythmias-PAF (paroxysmal atrial fibrillation)/SVT/A. flutter  -Cards had signed off due to rhythm stability but returned 8/17 due to RVR -rate now  controlled on q 6 hour Lopressor -Cardiology has spoken extensively with the pt who now agrees to begin Coumadin 8/19  -Was on Plavix and ASA as prior to admit for AF and CAD. Cardiology discontinued Plavix and Lovenox bridge secondary to hematuria. Heparin added for DVT prophylaxis. Continue Coumadin. Discontinued all anticoagulants 8/21 secondary to hematuria/clots -TSH/Free T4 normal  MYOCARDIAL INFARCTION, HX OF/CORONARY ARTERY DISEASE  -per Cards  -Echocardiogram shows a mild left atrial dilation and mild left ventricle hypertrophy.    Type 2 diabetes, uncontrolled, with retinopathy and neuropathy  -Continue patient's customized insulin regimen except for:  Lunch NovoLog 12 units  Resistant SSI -CBG improved continue hold Actos and Amaryl secondary to multiple episodes of hypoglycemia -Cont home regimen since this was the only one which kept his hemoglobin A1c close to being within ADA guidelines. -CBGs finally < 200 since Prednisone tapered but has periodic fluctuations. Add Lantus. Monitor  History of CVA (cerebrovascular accident)  HLD   -Continue Zocor 40 mg daily  -lipid panel this admit within ADA guidelines  Anasarca - Likely secondary to volume resuscitation early on admission and hypoalbuminemia. When necessary doses of IV Lasix and monitor. Diuresing.  Code Status: FULL Family Communication: Discussed with spouse at bedside on 08/02/14. Disposition Plan:  discharge home when medically stable-possibly in the next 48 hours.   Consultants: Dr. Doren Custard Nahser(cardiology)  Dr. Scharlene Gloss (Infectious disease) Urology  Procedure/Significant Events: 8/14 echocardiogram Left ventricle: mild concentric hypertrophy. LVEF=55% to 60%. -Ascending aorta: ascending aorta was mildly dilated. - Left atrium: mildly dilated.  Culture 8/13 blood right arm/hand positive for (GNR) HAEMOPHILUS INFLUENZAE/ESBL negative 8/13 MRSA PCR negative  8/14 urine  negative  Antibiotics: Azithromycin 8/13 >>> stopped 8/14  Ceftriaxone 8/13 >>> stopped 8/14  Levofloxacin x1 dose  Vancomycin 8./13>> stopped 8/16 Zosyn 8/14>> 8/20   oral Augmentin 8/20 >  DVT prophylaxis: Lovenox dc'd 8/18 due to hematuria SCDs  Objective: VITAL SIGNS: Temp: 98.7 F (37.1 C) (08/21 0609) Temp src: Oral (08/21 0609) BP: 123/42 mmHg (08/21 1037) Pulse Rate: 80 (08/21 1037) SPO2; 97% on 3 L via [place]  Intake/Output Summary (Last 24 hours) at 08/02/14 1202 Last data filed at 08/02/14 0857  Gross per 24 hour  Intake    540 ml  Output   4800 ml  Net  -4260 ml   Exam: General: No resting respiratory distress. Sitting up in bed this morning with abdominal pain/difficulty urinating. Lungs:  reduced breath sounds in bases with few fine crackles but otherwise clear to auscultation. No increased work of breathing. Cardiovascular: regular rate without murmur gallop or rub normal S1 and S2. Telemetry: Sinus rhythm with PACs.  Abdomen: Nontender, nondistended, soft, bowel sounds positive, no rebound, no ascites, no appreciable mass.? Anterior abdominal wall edema. GU: Foley in place with maroon colored blood tinged urine but no clots Extremities: No significant cyanosis, clubbing. Bilateral 2+ pitting edema to groin bilaterally, mild scrotal edema - decreasing.   Data Reviewed: Basic Metabolic Panel:  Recent Labs Lab 07/28/14 0313 07/29/14 0322 07/30/14 0300 07/31/14 0310 08/02/14 0605  NA 139 138 141 144 143  K 4.1 4.1 3.8 4.4 3.4*  CL 104 102 101 101 101  CO2 26 27 30  34* 35*  GLUCOSE 126* 304* 136* 122* 201*  BUN 19 18 17 17  15  CREATININE 0.75 0.77 0.76 0.92 0.72  CALCIUM 8.8 8.7 8.7 8.9 8.8  MG 2.1 2.0  --   --   --    Liver Function Tests:  Recent Labs Lab 07/28/14 0313 07/29/14 0322  AST 25 17  ALT 14 13  ALKPHOS 58 61  BILITOT 0.3 0.2*  PROT 6.0 5.8*  ALBUMIN 2.3* 2.3*   CBC:  Recent Labs Lab 07/28/14 0313 07/29/14 0322  07/30/14 0300 07/31/14 0310 08/02/14 0605  WBC 14.1* 15.7* 13.5* 14.1* 8.0  NEUTROABS 12.9* 14.3*  --   --   --   HGB 10.4* 10.3* 10.7* 11.3* 11.0*  HCT 32.8* 32.8* 33.0* 34.7* 34.8*  MCV 95.3 95.9 94.6 95.6 97.8  PLT 296 306 310 305 300    Cardiac Enzymes: No results found for this basename: CKTOTAL, CKMB, CKMBINDEX, TROPONINI,  in the last 168 hours BNP (last 3 results)  Recent Labs  07/25/14 1209  PROBNP 1647.0*   CBG:  Recent Labs Lab 08/02/14 0024 08/02/14 0417 08/02/14 0616 08/02/14 0735 08/02/14 1103  GLUCAP 210* 184* 191* 224* 180*    Recent Results (from the past 240 hour(s))  CULTURE, BLOOD (ROUTINE X 2)     Status: None   Collection Time    07/25/14  1:40 PM      Result Value Ref Range Status   Specimen Description BLOOD ARM RIGHT   Final   Special Requests BOTTLES DRAWN AEROBIC AND ANAEROBIC 10CC   Final   Culture  Setup Time     Final   Value: 07/25/2014 17:26     Performed at [facility]   Culture     Final   Value: HAEMOPHILUS INFLUENZAE     Note: BETA LACTAMASE NEGATIVE     Note: Gram Stain Report Called to,Read Back By and Verified With: Gus Height RN [phone]     Performed at [facility]   Report Status 07/27/2014 FINAL   Final  CULTURE, BLOOD (ROUTINE X 2)     Status: None   Collection Time    07/25/14  1:50 PM      Result Value Ref Range Status   Specimen Description BLOOD HAND RIGHT   Final   Special Requests BOTTLES DRAWN AEROBIC AND ANAEROBIC 10CC   Final   Culture  Setup Time     Final   Value: 07/25/2014 17:26     Performed at [facility]   Culture     Final   Value: HAEMOPHILUS INFLUENZAE     Note: BETA LACTAMASE NEGATIVE     Note: Gram Stain Report Called to,Read Back By and Verified With: Gus Height RN [phone]     Performed at [facility]   Report Status 07/27/2014 FINAL   Final  MRSA PCR SCREENING     Status: None   Collection Time    07/25/14  7:10 PM      Result Value Ref Range Status    MRSA by PCR NEGATIVE  NEGATIVE Final   Comment:            The GeneXpert MRSA Assay (FDA     approved for NASAL specimens     only), is one component of a     comprehensive MRSA colonization     surveillance program. It is not     intended to diagnose MRSA     infection nor to guide or     monitor treatment for     MRSA infections.  URINE CULTURE     Status: None   Collection Time    07/26/14  3:00 PM      Result Value Ref Range Status   Specimen Description URINE, CLEAN CATCH   Final   Special Requests NONE   Final   Culture  Setup Time     Final   Value: 07/26/2014 18:59     Performed at [place]     Final   Value: 1,000 COLONIES/ML     Performed at [facility]   Culture     Final   Value: INSIGNIFICANT GROWTH     Performed at [facility]   Report Status 07/27/2014 FINAL   Final  URINE CULTURE     Status: None   Collection Time    07/30/14  6:27 PM      Result Value Ref Range Status   Specimen Description URINE, CATHETERIZED   Final   Special Requests NONE   Final   Culture  Setup Time     Final   Value: 07/30/2014 20:41     Performed at [facility]     Final   Value: NO GROWTH     Performed at [facility]   Culture     Final   Value: NO GROWTH     Performed at [facility]   Report Status 08/01/2014 FINAL   Final  CULTURE, BLOOD (ROUTINE X 2)     Status: None   Collection Time    07/31/14  3:10 AM      Result Value Ref Range Status   Specimen Description BLOOD RIGHT ARM   Final   Special Requests BOTTLES DRAWN AEROBIC AND ANAEROBIC 5CC EACH   Final   Culture  Setup Time     Final   Value: 07/31/2014 08:52     Performed at [facility]   Culture     Final   Value:        BLOOD CULTURE RECEIVED NO GROWTH TO DATE CULTURE WILL BE HELD FOR 5 DAYS BEFORE ISSUING A FINAL NEGATIVE REPORT     Performed at [facility]   Report Status PENDING   Incomplete  CULTURE,  BLOOD (ROUTINE X 2)     Status: None   Collection Time    07/31/14  3:25 AM      Result Value Ref Range Status   Specimen Description BLOOD RIGHT HAND   Final   Special Requests BOTTLES DRAWN AEROBIC AND ANAEROBIC 5CC EACH   Final   Culture  Setup Time     Final   Value: 07/31/2014 08:52     Performed at [facility]   Culture     Final   Value:        BLOOD CULTURE RECEIVED NO GROWTH TO DATE CULTURE WILL BE HELD FOR 5 DAYS BEFORE ISSUING A FINAL NEGATIVE REPORT     Performed at [facility]   Report Status PENDING   Incomplete     Studies:  Recent x-ray studies have been reviewed in detail by the Attending Physician  Scheduled Meds:  Scheduled Meds: . amoxicillin-clavulanate  1 tablet Oral Q12H  . aspirin EC  81 mg Oral Daily  . gabapentin  300 mg Oral BID  . insulin aspart  0-20 Units Subcutaneous 6 times per day  . insulin aspart  10 Units Subcutaneous BID AC & HS  .  insulin aspart  12 Units Subcutaneous QAC supper  . insulin aspart  12 Units Subcutaneous QAC lunch  . levalbuterol  0.63 mg Nebulization TID  . losartan  100 mg Oral Daily  . metoprolol  25 mg Oral Q6H  . phenazopyridine  200 mg Oral TID WC  . predniSONE  20 mg Oral Q breakfast  . simvastatin  20 mg Oral q1800  . sodium chloride  3 mL Intravenous Q12H  . warfarin  10 mg Oral ONCE-1800  . Warfarin - Pharmacist Dosing Inpatient   Does not apply q1800   Time spent on care of this patient: 23 mins  HONGALGI,ANAND, MD, FACP, FHM. [facility] Pager [phone]  If 7PM-7AM, please contact night-coverage [email or web address] Password [email or web address] 08/02/2014, 12:02 PM   LOS: 8 days   I have personally examined this patient and reviewed the entire database. I have reviewed the above note, made any necessary editorial changes, and agree with its content.

## 2014-08-02 NOTE — Progress Notes (Addendum)
Patient is in significant amount of pain. MD notified. Patient's bladder was scanned for second time today and showed 391ml of urine. Additional pain medication given. Will continue to monitor patient for further changes in condition.

## 2014-08-02 NOTE — Progress Notes (Addendum)
Inpatient Diabetes Program Recommendations  AACE/ADA: New Consensus Statement on Inpatient Glycemic Control (2013)  Target Ranges:  Prepandial:   less than 140 mg/dL      Peak postprandial:   less than 180 mg/dL (1-2 hours)      Critically ill patients:  140 - 180 mg/dL   Results for ROQUE, SCHILL (MRN [id number]) as of 08/02/2014 10:39  Ref. Range 08/02/2014 00:21 08/02/2014 00:24 08/02/2014 04:17 08/02/2014 06:16 08/02/2014 07:35  Glucose-Capillary Latest Range: 70-99 mg/dL 269 (H) 210 (H) 184 (H) 191 (H) 224 (H)    Reason for assessment: elevated BG  Diabetes history: Type 2 Outpatient Diabetes medications: Amarly 4mg  bid, Actos45mg  daily, Humalog 5-20 units QID Current orders for Inpatient glycemic control: Novolog correction 0-20 units Qac,  Novolog 10 units pre breakfast, Novolog 12 units pre lunch, Novolog 12 units pre-supper and Novolog 10 units pre bedtime.    Recommend adding Lantus 17 -20 units Qday (169kg x .1=17) Change correction to tid plus hs scale per glycemic order set   Gentry Fitz, RN, [place], [place], CDE Diabetes Coordinator Inpatient Diabetes Program  [phone] (Team Pager) [phone] [facility]) 08/02/2014 10:39 AM

## 2014-08-03 DIAGNOSIS — R339 Retention of urine, unspecified: Secondary | ICD-10-CM | POA: Diagnosis not present

## 2014-08-03 LAB — PROTIME-INR
INR: 1.35 (ref 0.00–1.49)
PROTHROMBIN TIME: 16.7 s — AB (ref 11.6–15.2)

## 2014-08-03 LAB — BASIC METABOLIC PANEL
Anion gap: 8 (ref 5–15)
BUN: 15 mg/dL (ref 6–23)
CO2: 34 mEq/L — ABNORMAL HIGH (ref 19–32)
Calcium: 8.7 mg/dL (ref 8.4–10.5)
Chloride: 100 mEq/L (ref 96–112)
Creatinine, Ser: 0.79 mg/dL (ref 0.50–1.35)
GFR, EST NON AFRICAN AMERICAN: 88 mL/min — AB (ref >90)
GLUCOSE: 164 mg/dL — AB (ref 70–99)
Potassium: 3.6 mEq/L — ABNORMAL LOW (ref 3.7–5.3)
SODIUM: 142 meq/L (ref 137–147)

## 2014-08-03 LAB — GLUCOSE, CAPILLARY
Glucose-Capillary: 134 mg/dL — ABNORMAL HIGH (ref 70–99)
Glucose-Capillary: 195 mg/dL — ABNORMAL HIGH (ref 70–99)
Glucose-Capillary: 226 mg/dL — ABNORMAL HIGH (ref 70–99)
Glucose-Capillary: 193 mg/dL — ABNORMAL HIGH (ref 70–99)

## 2014-08-03 NOTE — Progress Notes (Signed)
Placed pt. On CPAP of 10cm H2O via nasal mask. Pt. Is tolerating CPAP well at this time without any complications.

## 2014-08-03 NOTE — Progress Notes (Signed)
Irrigated patient's bladder with 30 ml of normal saline as per MD order. Received no return . Dr. Roni Bread made aware and is route to hospital to assess patient. Will continue to monitor.  Esperanza Heir, RN

## 2014-08-03 NOTE — Progress Notes (Signed)
Patient continues to have frank hematuria via his foley catheter. Phone call placed to Dr. Dia Crawford who is the MD on-call with regards to [atient's aspirin order. Dr. Sherral Hammers ordered to discontinue aspirin. Aspirin discontinued as per MD order. Will continue to monitor.  Esperanza Heir, RN

## 2014-08-03 NOTE — Progress Notes (Addendum)
Patient is sleeping peacefully with C-PAP in place. No signs/symtpoms of distress/discomfort noted. Will continue to monitor.  Esperanza Heir, RN

## 2014-08-03 NOTE — Progress Notes (Signed)
Patient ID: Charles [name], male   DOB: Jul 12, 1941, 73 y.o.   MRN: [id number]    Subjective: I was called at 6 am about Mr. Charles [name].  He was complaining of bladder spasms and had a PVR by BS of 57ml.   I asked that his foley be irrigated but that was not successful.  He remains in considerable pain.   ROS:  Review of Systems  Constitutional: Negative for fever.  Gastrointestinal: Negative for nausea.   Allergies  Allergen Reactions  . Xarelto [Rivaroxaban]     rash    Past Medical History  Diagnosis Date  . CAD (coronary artery disease)     a. Approx. 2000 - MI. Cath showed single vessel disease, PTCA dLAD/medical management. ;  b. NSTEMI 11/13 => LHC: prox and mid LAD 30%, dLAD 60%, pCFX 30%, inf branch of OM 40%, mRCA 50-60%, EF 55-60%; IVUS attempted for RCA but not successful; anatomy felt stable from 2000 => med Rx.  . Diverticulitis   . Hypertension   . Morbid obesity   . CVA (cerebral infarction)     a. Approx. 2002  . Cholelithiasis   . Hypercholesterolemia   . ED (erectile dysfunction)   . OSA on CPAP   . Type II diabetes mellitus   . History of stomach ulcers   . History of MRSA infection     "little toe right foot" (10/26/2012)  . Tunnel vision     "both eyes since stroke"  . Claustrophobia   . B12 deficiency   . Diabetic neuropathy   . PAF (paroxysmal atrial fibrillation)     a. confirmed by event monitor. b. 02/2014 rash on Coumadin, patient decided to discontinue Xarelto due to possible rash, cost and lawyers ads on TV, agreed to take Plavix.    Past Surgical History  Procedure Laterality Date  . Toe amputation  2006; 2009    "Dr. Blenda Mounts; big toe left foot; little toe on right foot" (10/26/2012)  . Cardiac catheterization  1990's  . Cerebral angiogram  ~ 2000    History   Social History  . Marital Status: Married    Spouse Name: N/A    Number of Children: N/A  . Years of Education: N/A   Occupational History  . Not on file.   Social History Main  Topics  . Smoking status: Former Smoker -- 1.00 packs/day for 30 years    Types: Cigarettes, Cigars  . Smokeless tobacco: Current User    Types: Snuff     Comment: 10/26/2012 "quit smoking cigarettes  in ~ 1996; wife smokes in the house"  . Alcohol Use: Yes     Comment: 10/26/2012 "have a drink maybe twice/yr"  . Drug Use: No  . Sexual Activity: Yes   Other Topics Concern  . Not on file   Social History Narrative   Disabled s/p CVA    Family History  Problem Relation Age of Onset  . Asthma Neg Hx   . Cancer Mother 12    breast  . Heart disease Father 71    heart    Anti-infectives: Anti-infectives   Start     Dose/Rate Route Frequency Ordered Stop   08/01/14 1000  amoxicillin-clavulanate (AUGMENTIN) 875-125 MG per tablet 1 tablet     1 tablet Oral Every 12 hours 08/01/14 0948 08/06/14 0959   07/26/14 2000  piperacillin-tazobactam (ZOSYN) IVPB 3.375 g  Status:  Discontinued     3.375 g 12.5 mL/hr over 240 Minutes Intravenous Every  8 hours 07/26/14 1900 08/01/14 0947   07/26/14 0600  vancomycin (VANCOCIN) 1,500 mg in sodium chloride 0.9 % 500 mL IVPB  Status:  Discontinued     1,500 mg 250 mL/hr over 120 Minutes Intravenous Every 12 hours 07/25/14 1617 07/28/14 0908   07/25/14 2200  azithromycin (ZITHROMAX) 500 mg in dextrose 5 % 250 mL IVPB  Status:  Discontinued     500 mg 250 mL/hr over 60 Minutes Intravenous Every 24 hours 07/25/14 1907 07/26/14 1902   07/25/14 2000  cefTRIAXone (ROCEPHIN) 1 g in dextrose 5 % 50 mL IVPB  Status:  Discontinued     1 g 100 mL/hr over 30 Minutes Intravenous Every 24 hours 07/25/14 1907 07/26/14 1902   07/25/14 1630  vancomycin (VANCOCIN) 2,500 mg in sodium chloride 0.9 % 500 mL IVPB     2,500 mg 250 mL/hr over 120 Minutes Intravenous STAT 07/25/14 1616 07/25/14 1830   07/25/14 1430  levofloxacin (LEVAQUIN) IVPB 750 mg     750 mg 100 mL/hr over 90 Minutes Intravenous  Once 07/25/14 1415 07/25/14 1611      Current  Facility-Administered Medications  Medication Dose Route Frequency Provider Last Rate Last Dose  . acetaminophen (TYLENOL) tablet 650 mg  650 mg Oral Q6H PRN Bonnielee Haff, MD   650 mg at 08/02/14 1427   Or  . acetaminophen (TYLENOL) suppository 650 mg  650 mg Rectal Q6H PRN Bonnielee Haff, MD      . amoxicillin-clavulanate (AUGMENTIN) 875-125 MG per tablet 1 tablet  1 tablet Oral Q12H Modena Jansky, MD   1 tablet at 08/02/14 2219  . furosemide (LASIX) injection 40 mg  40 mg Intravenous Daily Modena Jansky, MD      . gabapentin (NEURONTIN) capsule 300 mg  300 mg Oral BID Bonnielee Haff, MD   300 mg at 08/02/14 2219  . insulin aspart (novoLOG) injection 0-15 Units  0-15 Units Subcutaneous TID WC Modena Jansky, MD   2 Units at 08/03/14 1610  . insulin aspart (novoLOG) injection 0-5 Units  0-5 Units Subcutaneous QHS Modena Jansky, MD   2 Units at 08/02/14 2226  . insulin aspart (novoLOG) injection 10 Units  10 Units Subcutaneous BID AC & HS Allie Bossier, MD   10 Units at 08/02/14 2222  . insulin aspart (novoLOG) injection 12 Units  12 Units Subcutaneous QAC supper Allie Bossier, MD   12 Units at 07/31/14 1833  . insulin aspart (novoLOG) injection 12 Units  12 Units Subcutaneous QAC lunch Allie Bossier, MD   12 Units at 08/02/14 1148  . insulin glargine (LANTUS) injection 10 Units  10 Units Subcutaneous Daily Modena Jansky, MD   10 Units at 08/02/14 1428  . levalbuterol (XOPENEX) nebulizer solution 0.63 mg  0.63 mg Nebulization Q4H PRN Bonnielee Haff, MD   0.63 mg at 07/29/14 1755  . levalbuterol (XOPENEX) nebulizer solution 0.63 mg  0.63 mg Nebulization TID Allie Bossier, MD   0.63 mg at 08/02/14 1950  . losartan (COZAAR) tablet 100 mg  100 mg Oral Daily Samella Parr, NP   100 mg at 08/02/14 1037  . metoprolol (LOPRESSOR) injection 2.5 mg  2.5 mg Intravenous Q5 min PRN Samella Parr, NP   2.5 mg at 07/29/14 1055  . metoprolol tartrate (LOPRESSOR) tablet 25 mg  25 mg Oral Q6H  Samella Parr, NP   25 mg at 08/03/14 0357  . morphine 2 MG/ML injection 2-4 mg  2-4  mg Intravenous Q4H PRN Allie Bossier, MD   2 mg at 08/02/14 1633  . ondansetron (ZOFRAN) tablet 4 mg  4 mg Oral Q6H PRN Bonnielee Haff, MD       Or  . ondansetron (ZOFRAN) injection 4 mg  4 mg Intravenous Q6H PRN Bonnielee Haff, MD      . oxybutynin (DITROPAN) tablet 10 mg  10 mg Oral Q8H PRN Alexis Frock, MD   10 mg at 08/03/14 9798  . oxyCODONE (Oxy IR/ROXICODONE) immediate release tablet 5 mg  5 mg Oral Q4H PRN Bonnielee Haff, MD   5 mg at 08/02/14 2220  . predniSONE (DELTASONE) tablet 10 mg  10 mg Oral Q breakfast Modena Jansky, MD   10 mg at 08/03/14 9211  . simvastatin (ZOCOR) tablet 20 mg  20 mg Oral q1800 Bonnielee Haff, MD   20 mg at 08/02/14 1639  . sodium chloride 0.9 % injection 3 mL  3 mL Intravenous Q12H Bonnielee Haff, MD   3 mL at 08/02/14 2221  . Warfarin - Pharmacist Dosing Inpatient   Does not apply [id number] Modena Jansky, MD       I have reviewed his medical history and medications.    Objective: Vital signs in last 24 hours: Temp:  [97.4 F (36.3 C)-98.2 F (36.8 C)] 97.4 F (36.3 C) (08/22 0357) Pulse Rate:  [70-85] 82 (08/22 0357) Resp:  [18-22] 18 (08/22 0357) BP: (100-155)/(42-70) 142/70 mmHg (08/22 0357) SpO2:  [92 %-96 %] 94 % (08/22 0357) Weight:  [170.643 kg (376 lb 3.2 oz)] 170.643 kg (376 lb 3.2 oz) (08/22 0357)  Intake/Output from previous day: 08/21 0701 - 08/22 0700 In: 600 [P.O.:600] Out: 650 [Urine:650] Intake/Output this shift:     Physical Exam  Constitutional: He is well-developed, well-nourished, and in no distress.  Abdominal: Soft. There is tenderness.  Very obese  Genitourinary:  He is uncircumcised with a buried penis with a foley indwelling with no drainage.     Lab Results:   Recent Labs  08/02/14 0605  WBC 8.0  HGB 11.0*  HCT 34.8*  PLT 300   BMET  Recent Labs  08/02/14 0605 08/03/14 0349  NA 143 142  K 3.4* 3.6*  CL  101 100  CO2 35* 34*  GLUCOSE 201* 164*  BUN 15 15  CREATININE 0.72 0.79  CALCIUM 8.8 8.7   PT/INR  Recent Labs  08/02/14 0605 08/03/14 0349  LABPROT 15.3* 16.7*  INR 1.21 1.35   ABG No results found for this basename: PHART, PCO2, PO2, HCO3,  in the last 72 hours  Studies/Results: No results found.  Procedure:  I attempted to irrigate his foley without success so I deflated the balloon and advanced the catheter which didn't seem to be in far enough.  With this maneuver I was able to clear the clots and drained 500cc of bloody pyridium stained urine and several cc of clots.   I then irrigated teh bladder till clear and refilled the balloon with 10cc.   The foley was returned to straight drainage.  Assessment: Recurrent clot retention.   Plan: Hold warfarin if still being given until urine remains clear. I have recommended irrigating ever 1-2 hours to maintain patency.  If the bleeding resumes, he will need a larger catheter.      LOS: 9 days    Delila Kuklinski J 08/03/2014

## 2014-08-03 NOTE — Progress Notes (Signed)
Progress Note  Charles [name] MHD:[id number] DOB: 1941/01/28 DOA: 07/25/2014 PCP: [name], MD  Admit HPI / Brief Narrative: 73 y.o. M Hx Paroxysmal atrial fibrillation, obstructive sleep apnea, Hx stroke, Hx CAD, DM Type 2 with neuropathy, and retinopathy, HTN, HLD.  Endorsed was in his usual state of health when he started developing minimal shortness of breath. He reported "cold like symptoms". Over the next 2-3 days his symptoms progressively worsened. He did not have any cough initially but started coughing 24 hours prior to presentation and developed fever up to 101.36F. On the morning of admisison he woke up to go to the bathroom and became very short of breath, even with minimal exertion. He normally uses a CPAP every night and despite the use of CPAP he did not feel any better. His wife gave him her oxygen and one of her inhalers without any relief. Since his symptoms were not improving they called his primary care physician, who recommended that they call 911. When EMS arrived at the house he was found to be in respiratory distress. He was found to be cold, clammy, diaphoretic. Saturations initially were in 72%. He was placed on a nonrebreather and subsequently, on BiPAP. Was given Nebulizer treatments, steroids, and, he was brought into the emergency department.   In the emergency department it was noted that his blood pressure had been dropping. He was also tachycardic. Patient denied any dizziness, or any chest pain, per se. He was experiencing pleuritic type chest discomfort with coughing.  HPI/Subjective: Patient had some difficulty urinating again this morning-resolved after intervention by urology. Denies complaints.  Assessment/Plan:  Hematuria/severe urinary urgency/urinary retention secondary to blood clots -suspect foley trauma due to accidental slippage of catheter into urethra after placement -Coude' catheter placed under cystoscopy 8/18 and to remain in place for at  least 10 days -pt needs to follow up with Dr. Tresa Moore for voiding trial as outpt -post catheter UA abnormal but fu cx negative -Urology closely following since 8/21-has undergone frequent irrigation. As per urology, discontinued all platelets and anticoagulants for at least 2 weeks until hematuria resolves. The patient and spouse aware of small increased risk of stroke while off these medications. - Management per urology. Improving.  Sepsis due to CAP and H Flu Bacteremia / ESBL negative -Patient on antibiotics since 8/13. Received single dose of azithromycin, Rocephin and levofloxacin on 8/13. Completed 3 days of vancomycin 8/13-8/15. Has been on IV Zosyn since 8/14 (6 days). -peak lactic acid 4.7 but has normalized -PCT peaked at 2.76 -ID consulted and no indication for TEE for bacteremia -blood cx's repeated 8/19-negative to date -  discussed with infectious disease M.D. on call on 8/20 and switched antibiotics to oral Augmentin to complete additional 5 days treatment.   Acute respiratory failure with hypoxia due to Haemophilus influenza CAP and acute COPD exac -cont current anbx's  - Able to wean off of oxygen. -Continue flutter valve  -Taper Prednisone to 20 mg daily 8/19-have been decreasing by 10 mg daily -cont Xopenex PRN/DuoNeb QID -Continue O2 to maintain SpO2 > 93%   -cont Flutter valve - Follow chest x-ray 8/22.  HYPERTENSION with concentric hypertrophy -Continue metoprolol; continue hold other hypertensive medication.   -8/17: Was hypotensive when VR 130-170 but SBP back up to 130s with controlled VR so IVFs to kvo -marked decrease in edema especially penile/scrotal edema after 1x dose IV Lasix- Lasix at home was weekly dosed. Diuresing. Lasix held until acute urinary issues have resolved.  Atrial  arrhythmias-PAF (paroxysmal atrial fibrillation)/SVT/A. flutter  -Cards had signed off due to rhythm stability but returned 8/17 due to RVR -rate now controlled on q 6 hour  Lopressor -Cardiology has spoken extensively with the pt who now agrees to begin Coumadin 8/19-antiplatelets and anticoagulants held secondary to hematuria and urinary retention secondary to same.  -Was on Plavix and ASA as prior to admit for AF and CAD. Cardiology discontinued Plavix and Lovenox bridge secondary to hematuria. Heparin added for DVT prophylaxis. Continue Coumadin. Discontinued all anticoagulants 8/21 secondary to hematuria/clots -TSH/Free T4 normal - Remains in sinus rhythm. [place] telemetry on 8/22.  MYOCARDIAL INFARCTION, HX OF/CORONARY ARTERY DISEASE  -per Cards  -Echocardiogram shows a mild left atrial dilation and mild left ventricle hypertrophy.    Type 2 diabetes, uncontrolled, with retinopathy and neuropathy  -Continue patient's customized insulin regimen except for:  Lunch NovoLog 12 units  Resistant SSI -CBG improved continue hold Actos and Amaryl secondary to multiple episodes of hypoglycemia -Cont home regimen since this was the only one which kept his hemoglobin A1c close to being within ADA guidelines. -CBGs finally < 200 since Prednisone tapered but has periodic fluctuations. Add Lantus. Monitor. Better.  History of CVA (cerebrovascular accident)  HLD   -Continue Zocor 40 mg daily  -lipid panel this admit within ADA guidelines  Anasarca - Likely secondary to volume resuscitation early on admission and hypoalbuminemia. When necessary doses of IV Lasix and monitor. Holding Lasix until acute urinary issues have resolved.  Code Status: FULL Family Communication: Discussed with spouse at bedside on 08/03/14. Disposition Plan:  discharge home when medically stable.  Consultants: Dr. Doren Custard Nahser(cardiology)  Dr. Scharlene Gloss (Infectious disease) Urology  Procedure/Significant Events: 8/14 echocardiogram Left ventricle: mild concentric hypertrophy. LVEF=55% to 60%. -Ascending aorta: ascending aorta was mildly dilated. - Left atrium: mildly  dilated.  Culture 8/13 blood right arm/hand positive for (GNR) HAEMOPHILUS INFLUENZAE/ESBL negative 8/13 MRSA PCR negative  8/14 urine negative  Antibiotics: Azithromycin 8/13 >>> stopped 8/14  Ceftriaxone 8/13 >>> stopped 8/14  Levofloxacin x1 dose  Vancomycin 8./13>> stopped 8/16 Zosyn 8/14>> 8/20   oral Augmentin 8/20 >  DVT prophylaxis: Lovenox dc'd 8/18 due to hematuria SCDs  Objective: VITAL SIGNS: Temp: 97.4 F (36.3 C) (08/22 0357) Temp src: Oral (08/22 0357) BP: 116/80 mmHg (08/22 1113) Pulse Rate: 82 (08/22 0357) SPO2; 93% on RA   Intake/Output Summary (Last 24 hours) at 08/03/14 1229 Last data filed at 08/03/14 1129  Gross per 24 hour  Intake    960 ml  Output    950 ml  Net     10 ml   Exam: General: No resting respiratory distress. Sitting up in chair without distress. Lungs:  clear to auscultation. No increased work of breathing. Cardiovascular: regular rate without murmur gallop or rub normal S1 and S2. Telemetry: Sinus rhythm.  Abdomen: Nontender, nondistended, soft, bowel sounds positive, no rebound, no ascites, no appreciable mass.? Anterior abdominal wall edema. GU: Foley in place with maroon colored blood tinged urine but no clots Extremities: No significant cyanosis, clubbing. Bilateral 2+ pitting edema to groin bilaterally, mild scrotal edema - decreasing.   Data Reviewed: Basic Metabolic Panel:  Recent Labs Lab 07/28/14 0313 07/29/14 0322 07/30/14 0300 07/31/14 0310 08/02/14 0605 08/03/14 0349  NA 139 138 141 144 143 142  K 4.1 4.1 3.8 4.4 3.4* 3.6*  CL 104 102 101 101 101 100  CO2 26 27 30  34* 35* 34*  GLUCOSE 126* 304* 136* 122* 201* 164*  BUN  19 18 17 17 15 15   CREATININE 0.75 0.77 0.76 0.92 0.72 0.79  CALCIUM 8.8 8.7 8.7 8.9 8.8 8.7  MG 2.1 2.0  --   --   --   --    Liver Function Tests:  Recent Labs Lab 07/28/14 0313 07/29/14 0322  AST 25 17  ALT 14 13  ALKPHOS 58 61  BILITOT 0.3 0.2*  PROT 6.0 5.8*  ALBUMIN 2.3*  2.3*   CBC:  Recent Labs Lab 07/28/14 0313 07/29/14 0322 07/30/14 0300 07/31/14 0310 08/02/14 0605  WBC 14.1* 15.7* 13.5* 14.1* 8.0  NEUTROABS 12.9* 14.3*  --   --   --   HGB 10.4* 10.3* 10.7* 11.3* 11.0*  HCT 32.8* 32.8* 33.0* 34.7* 34.8*  MCV 95.3 95.9 94.6 95.6 97.8  PLT 296 306 310 305 300    Cardiac Enzymes: No results found for this basename: CKTOTAL, CKMB, CKMBINDEX, TROPONINI,  in the last 168 hours BNP (last 3 results)  Recent Labs  07/25/14 1209  PROBNP 1647.0*   CBG:  Recent Labs Lab 08/02/14 1103 08/02/14 1609 08/02/14 2212 08/03/14 0503 08/03/14 1125  GLUCAP 180* 108* 233* 134* 195*    Recent Results (from the past 240 hour(s))  CULTURE, BLOOD (ROUTINE X 2)     Status: None   Collection Time    07/25/14  1:40 PM      Result Value Ref Range Status   Specimen Description BLOOD ARM RIGHT   Final   Special Requests BOTTLES DRAWN AEROBIC AND ANAEROBIC 10CC   Final   Culture  Setup Time     Final   Value: 07/25/2014 17:26     Performed at [facility]   Culture     Final   Value: HAEMOPHILUS INFLUENZAE     Note: BETA LACTAMASE NEGATIVE     Note: Gram Stain Report Called to,Read Back By and Verified With: Gus Height RN [phone]     Performed at [facility]   Report Status 07/27/2014 FINAL   Final  CULTURE, BLOOD (ROUTINE X 2)     Status: None   Collection Time    07/25/14  1:50 PM      Result Value Ref Range Status   Specimen Description BLOOD HAND RIGHT   Final   Special Requests BOTTLES DRAWN AEROBIC AND ANAEROBIC 10CC   Final   Culture  Setup Time     Final   Value: 07/25/2014 17:26     Performed at [facility]   Culture     Final   Value: HAEMOPHILUS INFLUENZAE     Note: BETA LACTAMASE NEGATIVE     Note: Gram Stain Report Called to,Read Back By and Verified With: Gus Height RN [phone]     Performed at [facility]   Report Status 07/27/2014 FINAL   Final  MRSA PCR SCREENING     Status: None   Collection  Time    07/25/14  7:10 PM      Result Value Ref Range Status   MRSA by PCR NEGATIVE  NEGATIVE Final   Comment:            The GeneXpert MRSA Assay (FDA     approved for NASAL specimens     only), is one component of a     comprehensive MRSA colonization     surveillance program. It is not     intended to diagnose MRSA     infection nor to guide or  monitor treatment for     MRSA infections.  URINE CULTURE     Status: None   Collection Time    07/26/14  3:00 PM      Result Value Ref Range Status   Specimen Description URINE, CLEAN CATCH   Final   Special Requests NONE   Final   Culture  Setup Time     Final   Value: 07/26/2014 18:59     Performed at [facility]     Final   Value: 1,000 COLONIES/ML     Performed at [facility]   Culture     Final   Value: INSIGNIFICANT GROWTH     Performed at [facility]   Report Status 07/27/2014 FINAL   Final  URINE CULTURE     Status: None   Collection Time    07/30/14  6:27 PM      Result Value Ref Range Status   Specimen Description URINE, CATHETERIZED   Final   Special Requests NONE   Final   Culture  Setup Time     Final   Value: 07/30/2014 20:41     Performed at [facility]     Final   Value: NO GROWTH     Performed at [facility]   Culture     Final   Value: NO GROWTH     Performed at [facility]   Report Status 08/01/2014 FINAL   Final  CULTURE, BLOOD (ROUTINE X 2)     Status: None   Collection Time    07/31/14  3:10 AM      Result Value Ref Range Status   Specimen Description BLOOD RIGHT ARM   Final   Special Requests BOTTLES DRAWN AEROBIC AND ANAEROBIC 5CC EACH   Final   Culture  Setup Time     Final   Value: 07/31/2014 08:52     Performed at [facility]   Culture     Final   Value:        BLOOD CULTURE RECEIVED NO GROWTH TO DATE CULTURE WILL BE HELD FOR 5 DAYS BEFORE ISSUING A FINAL NEGATIVE REPORT     Performed at  [facility]   Report Status PENDING   Incomplete  CULTURE, BLOOD (ROUTINE X 2)     Status: None   Collection Time    07/31/14  3:25 AM      Result Value Ref Range Status   Specimen Description BLOOD RIGHT HAND   Final   Special Requests BOTTLES DRAWN AEROBIC AND ANAEROBIC 5CC EACH   Final   Culture  Setup Time     Final   Value: 07/31/2014 08:52     Performed at [facility]   Culture     Final   Value:        BLOOD CULTURE RECEIVED NO GROWTH TO DATE CULTURE WILL BE HELD FOR 5 DAYS BEFORE ISSUING A FINAL NEGATIVE REPORT     Performed at [facility]   Report Status PENDING   Incomplete     Studies:  Recent x-ray studies have been reviewed in detail by the Attending Physician  Scheduled Meds:  Scheduled Meds: . amoxicillin-clavulanate  1 tablet Oral Q12H  . furosemide  40 mg Intravenous Daily  . gabapentin  300 mg Oral BID  . insulin aspart  0-15 Units Subcutaneous TID WC  . insulin aspart  0-5  Units Subcutaneous QHS  . insulin aspart  10 Units Subcutaneous BID AC & HS  . insulin aspart  12 Units Subcutaneous QAC supper  . insulin aspart  12 Units Subcutaneous QAC lunch  . insulin glargine  10 Units Subcutaneous Daily  . levalbuterol  0.63 mg Nebulization TID  . losartan  100 mg Oral Daily  . metoprolol  25 mg Oral Q6H  . predniSONE  10 mg Oral Q breakfast  . simvastatin  20 mg Oral q1800  . sodium chloride  3 mL Intravenous Q12H  . Warfarin - Pharmacist Dosing Inpatient   Does not apply q1800   Time spent on care of this patient: 25 mins  Heaven Wandell, MD, FACP, FHM. [facility] Pager [phone]  If 7PM-7AM, please contact night-coverage [email or web address] Password [facility] 08/03/2014, 12:29 PM   LOS: 9 days

## 2014-08-03 NOTE — Progress Notes (Signed)
Rhythm is stable Anticoagulation on hold due to hematuria  Cardiology to see as needed over the weekend Please call with questions

## 2014-08-03 NOTE — Progress Notes (Signed)
Pts wife refused am Lantus. Family noted to have brought in cookies and muffins (not sugar free) to pt.

## 2014-08-03 NOTE — Progress Notes (Signed)
The patient was found to be oozing blood from a scratch on his right abdominal area.  Pressure was held on the scratch for approximately eight minutes until the bleeding subsided and a guaze dressing was placed over the site.  The guaze had to be changed a few hours later due to sanguineous drainage.

## 2014-08-03 NOTE — Progress Notes (Signed)
Patient complaining of feeling bladder spasms similar to what he experienced yesterday, and is insistent that the urologist be called, because he fears his cathter is clotting off again. Dr. Irine Seal, urologist on-call notified, and ordered intermittent bladder irrigation of 30 ml of normal saline as needed. This Probation officer verbalized understanding.  Esperanza Heir, RN

## 2014-08-03 NOTE — Progress Notes (Signed)
[place] for Coumadin Indication: atrial fibrillation  Allergies  Allergen Reactions  . Xarelto [Rivaroxaban]     rash    Patient Measurements: Height: 6\' 4"  (193 cm) Weight: 376 lb 3.2 oz (170.643 kg) (scale c) IBW/kg (Calculated) : 86.8  Vital Signs: Temp: 97.4 F (36.3 C) (08/22 0357) Temp src: Oral (08/22 0357) BP: 142/70 mmHg (08/22 0357) Pulse Rate: 82 (08/22 0357)  Labs:  Recent Labs  08/01/14 0500 08/02/14 0605 08/03/14 0349  HGB  --  11.0*  --   HCT  --  34.8*  --   PLT  --  300  --   LABPROT 15.1 15.3* 16.7*  INR 1.19 1.21 1.35  CREATININE  --  0.72 0.79    Estimated Creatinine Clearance: 142 ml/min (by C-G formula based on Cr of 0.79).   Medical History: Past Medical History  Diagnosis Date  . CAD (coronary artery disease)     a. Approx. 2000 - MI. Cath showed single vessel disease, PTCA dLAD/medical management. ;  b. NSTEMI 11/13 => LHC: prox and mid LAD 30%, dLAD 60%, pCFX 30%, inf branch of OM 40%, mRCA 50-60%, EF 55-60%; IVUS attempted for RCA but not successful; anatomy felt stable from 2000 => med Rx.  . Diverticulitis   . Hypertension   . Morbid obesity   . CVA (cerebral infarction)     a. Approx. 2002  . Cholelithiasis   . Hypercholesterolemia   . ED (erectile dysfunction)   . OSA on CPAP   . Type II diabetes mellitus   . History of stomach ulcers   . History of MRSA infection     "little toe right foot" (10/26/2012)  . Tunnel vision     "both eyes since stroke"  . Claustrophobia   . B12 deficiency   . Diabetic neuropathy   . PAF (paroxysmal atrial fibrillation)     a. confirmed by event monitor. b. 02/2014 rash on Coumadin, patient decided to discontinue Xarelto due to possible rash, cost and lawyers ads on TV, agreed to take Plavix.    Medications:  Prescriptions prior to admission  Medication Sig Dispense Refill  . acetaminophen (TYLENOL) 500 MG tablet Take 1,000 mg by mouth at bedtime.       Marland Kitchen aspirin 81 MG tablet Take 81 mg by mouth daily.        . Cholecalciferol (EQL VITAMIN D3) 1000 UNITS tablet Take 1,000 Units by mouth daily.        . clopidogrel (PLAVIX) 75 MG tablet Take 1 tablet (75 mg total) by mouth daily.  90 tablet  3  . Dextromethorphan HBr (VICKS DAYQUIL COUGH) 15 MG/15ML LIQD Take 15 mLs by mouth daily as needed (cough).      . furosemide (LASIX) 80 MG tablet Take 80 mg by mouth once a week.      . gabapentin (NEURONTIN) 300 MG capsule Take 300 mg by mouth 2 (two) times daily.      Marland Kitchen glimepiride (AMARYL) 4 MG tablet Take 1 tablet (4 mg total) by mouth 2 (two) times daily.  180 tablet  3  . guaiFENesin (MUCINEX) 600 MG 12 hr tablet Take 600 mg by mouth 2 (two) times daily.      Marland Kitchen ibuprofen (ADVIL,MOTRIN) 200 MG tablet Take 400 mg by mouth every 8 (eight) hours as needed for fever.      . insulin lispro (HUMALOG KWIKPEN) 100 UNIT/ML KiwkPen Inject 5-20 Units into the skin 4 (four) times  daily - after meals and at bedtime. Per sliding scale      . losartan (COZAAR) 100 MG tablet Take 1 tablet (100 mg total) by mouth daily.  90 tablet  3  . metoprolol (LOPRESSOR) 50 MG tablet Take 1 tablet (50 mg total) by mouth 2 (two) times daily.  180 tablet  3  . pioglitazone (ACTOS) 45 MG tablet Take 1 tablet (45 mg total) by mouth daily.  90 tablet  3  . pravastatin (PRAVACHOL) 40 MG tablet Take 1 tablet (40 mg total) by mouth daily.  90 tablet  3  . Pseudoeph-Doxylamine-DM-APAP (NYQUIL PO) Take 15 mLs by mouth daily as needed (cough).      . triamcinolone cream (KENALOG) 0.5 % Apply 1 application topically 2 (two) times daily as needed (rash).      . vitamin B-12 (CYANOCOBALAMIN) 1000 MCG tablet Take 1,000 mcg by mouth daily.        . nitroGLYCERIN (NITROSTAT) 0.4 MG SL tablet Place 0.4 mg under the tongue every 5 (five) minutes as needed. For chest pain        Assessment: 73 y.o. male presented 8/13 with SOB. Known to pharmacy from abx dosing this admit. Noted pt in/out of  afib with RVR. Has been on Xarelto in the past but developed rash so no longer taking. Pt agreed to start coumadin. INR up to 1.35 today. Pt experienced considerable hematuria today. Heparin SQ has been d/c'ed.   Goal of Therapy:   INR 2-3 Monitor platelets by anticoagulation protocol: Yes   Plan:  Hold Coumadin till urine remains clear per Urology Follow up AM labs and resolution of hematuria  Albertina Parr, PharmD.  Clinical Pharmacist Pager [phone]

## 2014-08-04 LAB — GLUCOSE, CAPILLARY
Glucose-Capillary: 148 mg/dL — ABNORMAL HIGH (ref 70–99)
Glucose-Capillary: 321 mg/dL — ABNORMAL HIGH (ref 70–99)
Glucose-Capillary: 233 mg/dL — ABNORMAL HIGH (ref 70–99)
Glucose-Capillary: 97 mg/dL (ref 70–99)

## 2014-08-04 MED ORDER — POTASSIUM CHLORIDE CRYS ER 20 MEQ PO TBCR
40.0000 meq | EXTENDED_RELEASE_TABLET | Freq: Every day | ORAL | Status: DC
  Administered 2014-08-04 – 2014-08-06 (×3): 40 meq via ORAL
  Filled 2014-08-04 (×4): qty 2

## 2014-08-04 NOTE — Progress Notes (Signed)
Patient ID: COTY LARSH, male   DOB: 08-01-1941, 73 y.o.   MRN: [id number]  I had to come back in because of further clot retention.     I attempted to irrigate the current foley but was unsuccessful.\  I elected to place a 3-way hematuria catheter and initially attempted a 74fr coude but it was too large for the meatus.   I was able to get a 36fr coude in with some effort and once it was advanced I was able to irrigate out the clots and get the return to a light pink color.   The  Balloon was inflated with 64ml of sterile water and once seated there was only about 6cm of catheter between the meatus and the hub suggesting a very large prostate.   He was placed to continuous irrigation and remained nearly clear.  Imp: Foley trauma with clot retention.  Plan: Continue CBI until clear.    30 min was spent at the bedside providing critical care.

## 2014-08-04 NOTE — Progress Notes (Signed)
Patient ID: Charles [name], male   DOB: October 17, 1941, 73 y.o.   MRN: [id number]    Subjective: Charles [name] had a good night after the foley was changed to the 37fr hematuria catheter.   He has had minimal pain and spasm.  His Hgb is stable.  His urine is clear on CBI today. ROS:  Review of Systems  Constitutional: Negative.   Cardiovascular: Positive for leg swelling.  Gastrointestinal: Negative for abdominal pain.    Anti-infectives: Anti-infectives   Start     Dose/Rate Route Frequency Ordered Stop   08/01/14 1000  amoxicillin-clavulanate (AUGMENTIN) 875-125 MG per tablet 1 tablet     1 tablet Oral Every 12 hours 08/01/14 0948 08/06/14 0959   07/26/14 2000  piperacillin-tazobactam (ZOSYN) IVPB 3.375 g  Status:  Discontinued     3.375 g 12.5 mL/hr over 240 Minutes Intravenous Every 8 hours 07/26/14 1900 08/01/14 0947   07/26/14 0600  vancomycin (VANCOCIN) 1,500 mg in sodium chloride 0.9 % 500 mL IVPB  Status:  Discontinued     1,500 mg 250 mL/hr over 120 Minutes Intravenous Every 12 hours 07/25/14 1617 07/28/14 0908   07/25/14 2200  azithromycin (ZITHROMAX) 500 mg in dextrose 5 % 250 mL IVPB  Status:  Discontinued     500 mg 250 mL/hr over 60 Minutes Intravenous Every 24 hours 07/25/14 1907 07/26/14 1902   07/25/14 2000  cefTRIAXone (ROCEPHIN) 1 g in dextrose 5 % 50 mL IVPB  Status:  Discontinued     1 g 100 mL/hr over 30 Minutes Intravenous Every 24 hours 07/25/14 1907 07/26/14 1902   07/25/14 1630  vancomycin (VANCOCIN) 2,500 mg in sodium chloride 0.9 % 500 mL IVPB     2,500 mg 250 mL/hr over 120 Minutes Intravenous STAT 07/25/14 1616 07/25/14 1830   07/25/14 1430  levofloxacin (LEVAQUIN) IVPB 750 mg     750 mg 100 mL/hr over 90 Minutes Intravenous  Once 07/25/14 1415 07/25/14 1611      Current Facility-Administered Medications  Medication Dose Route Frequency Provider Last Rate Last Dose  . acetaminophen (TYLENOL) tablet 650 mg  650 mg Oral Q6H PRN Bonnielee Haff, MD   650 mg  at 08/02/14 1427   Or  . acetaminophen (TYLENOL) suppository 650 mg  650 mg Rectal Q6H PRN Bonnielee Haff, MD      . amoxicillin-clavulanate (AUGMENTIN) 875-125 MG per tablet 1 tablet  1 tablet Oral Q12H Modena Jansky, MD   1 tablet at 08/04/14 1037  . furosemide (LASIX) injection 40 mg  40 mg Intravenous Daily Modena Jansky, MD   40 mg at 08/04/14 1039  . gabapentin (NEURONTIN) capsule 300 mg  300 mg Oral BID Bonnielee Haff, MD   300 mg at 08/04/14 1038  . insulin aspart (novoLOG) injection 0-15 Units  0-15 Units Subcutaneous TID WC Modena Jansky, MD   2 Units at 08/04/14 [phone]  . insulin aspart (novoLOG) injection 0-5 Units  0-5 Units Subcutaneous QHS Modena Jansky, MD   2 Units at 08/02/14 2226  . insulin aspart (novoLOG) injection 10 Units  10 Units Subcutaneous BID AC & HS Allie Bossier, MD   10 Units at 08/03/14 2240  . insulin aspart (novoLOG) injection 12 Units  12 Units Subcutaneous QAC supper Allie Bossier, MD   12 Units at 07/31/14 1833  . insulin aspart (novoLOG) injection 12 Units  12 Units Subcutaneous QAC lunch Allie Bossier, MD   12 Units at 08/03/14 1255  .  insulin glargine (LANTUS) injection 10 Units  10 Units Subcutaneous Daily Modena Jansky, MD   10 Units at 08/04/14 1040  . levalbuterol (XOPENEX) nebulizer solution 0.63 mg  0.63 mg Nebulization Q4H PRN Bonnielee Haff, MD   0.63 mg at 07/29/14 1755  . levalbuterol (XOPENEX) nebulizer solution 0.63 mg  0.63 mg Nebulization TID Allie Bossier, MD   0.63 mg at 08/04/14 0845  . losartan (COZAAR) tablet 100 mg  100 mg Oral Daily Samella Parr, NP   100 mg at 08/04/14 1038  . metoprolol (LOPRESSOR) injection 2.5 mg  2.5 mg Intravenous Q5 min PRN Samella Parr, NP   2.5 mg at 07/29/14 1055  . metoprolol tartrate (LOPRESSOR) tablet 25 mg  25 mg Oral Q6H Samella Parr, NP   25 mg at 08/04/14 1038  . morphine 2 MG/ML injection 2-4 mg  2-4 mg Intravenous Q4H PRN Allie Bossier, MD   2 mg at 08/02/14 1633  .  ondansetron (ZOFRAN) tablet 4 mg  4 mg Oral Q6H PRN Bonnielee Haff, MD       Or  . ondansetron (ZOFRAN) injection 4 mg  4 mg Intravenous Q6H PRN Bonnielee Haff, MD      . oxybutynin (DITROPAN) tablet 10 mg  10 mg Oral Q8H PRN Alexis Frock, MD   10 mg at 08/03/14 9518  . oxyCODONE (Oxy IR/ROXICODONE) immediate release tablet 5 mg  5 mg Oral Q4H PRN Bonnielee Haff, MD   5 mg at 08/03/14 2316  . simvastatin (ZOCOR) tablet 20 mg  20 mg Oral q1800 Bonnielee Haff, MD   20 mg at 08/03/14 1958  . sodium chloride 0.9 % injection 3 mL  3 mL Intravenous Q12H Bonnielee Haff, MD   3 mL at 08/04/14 1039     Objective: Vital signs in last 24 hours: Temp:  [97.8 F (36.6 C)-98 F (36.7 C)] 97.8 F (36.6 C) (08/23 0553) Pulse Rate:  [65-82] 73 (08/23 0553) Resp:  [16-18] 16 (08/23 0553) BP: (111-135)/(49-80) 135/76 mmHg (08/23 0553) SpO2:  [92 %-95 %] 92 % (08/23 0847) Weight:  [171.913 kg (379 lb)] 171.913 kg (379 lb) (08/23 0553)  Intake/Output from previous day: 08/22 0701 - 08/23 0700 In: 9720 [P.O.:720] Out: 1050 [Urine:1050] Intake/Output this shift:     Physical Exam  Constitutional: He is oriented to person, place, and time and well-developed, well-nourished, and in no distress.  Abdominal: Soft. There is no tenderness.  obese  Genitourinary:  Penis remains buried.   Foley is draining well with clear return on slow CBI.  Neurological: He is alert and oriented to person, place, and time.    Lab Results:   Recent Labs  08/02/14 0605  WBC 8.0  HGB 11.0*  HCT 34.8*  PLT 300   BMET  Recent Labs  08/02/14 0605 08/03/14 0349  NA 143 142  K 3.4* 3.6*  CL 101 100  CO2 35* 34*  GLUCOSE 201* 164*  BUN 15 15  CREATININE 0.72 0.79  CALCIUM 8.8 8.7   PT/INR  Recent Labs  08/02/14 0605 08/03/14 0349  LABPROT 15.3* 16.7*  INR 1.21 1.35   ABG No results found for this basename: PHART, PCO2, PO2, HCO3,  in the last 72 hours  Studies/Results: No results  found.   Assessment: Doing well with hematuria catheter with clearing urine and a stable H&H.   Plan: I will have the CBI tapered and the port plugged when he is clear on no irrigation.  He will need to foley for 1-2 weeks post discharge to allow urethral healing.       LOS: 10 days    Nettye Flegal J 08/04/2014

## 2014-08-04 NOTE — Progress Notes (Signed)
Pt called me to room, felt like foley was clogged. Noted to have clots which I was able to irrigate out.

## 2014-08-04 NOTE — Progress Notes (Signed)
Patient is alert and oriented x 4. Denies complaint of pain/discomfort at present time. Three way foley catheter intact and is patent for blood-tinged urine. Continuous bladder irrigation flowing to gravity without difficulty. Will continue to monitor.  Esperanza Heir, RN

## 2014-08-04 NOTE — Progress Notes (Signed)
Pt again called. Clots noted in foley.

## 2014-08-04 NOTE — Progress Notes (Signed)
Progress Note  Charles [name] ZYS:[id number] DOB: 1941-12-01 DOA: 07/25/2014 PCP: [name], MD  Admit HPI / Brief Narrative: 73 y.o. M Hx Paroxysmal atrial fibrillation, obstructive sleep apnea, Hx stroke, Hx CAD, DM Type 2 with neuropathy, and retinopathy, HTN, HLD.  Endorsed was in his usual state of health when he started developing minimal shortness of breath. He reported "cold like symptoms". Over the next 2-3 days his symptoms progressively worsened. He did not have any cough initially but started coughing 24 hours prior to presentation and developed fever up to 101.81F. On the morning of admisison he woke up to go to the bathroom and became very short of breath, even with minimal exertion. He normally uses a CPAP every night and despite the use of CPAP he did not feel any better. His wife gave him her oxygen and one of her inhalers without any relief. Since his symptoms were not improving they called his primary care physician, who recommended that they call 911. When EMS arrived at the house he was found to be in respiratory distress. He was found to be cold, clammy, diaphoretic. Saturations initially were in 72%. He was placed on a nonrebreather and subsequently, on BiPAP. Was given Nebulizer treatments, steroids, and, he was brought into the emergency department.   In the emergency department it was noted that his blood pressure had been dropping. He was also tachycardic. Patient denied any dizziness, or any chest pain, per se. He was experiencing pleuritic type chest discomfort with coughing.  HPI/Subjective: Intermittent difficulty urinating. Now on CBI per urology.  Assessment/Plan:  Hematuria/severe urinary urgency/urinary retention secondary to blood clots -suspect foley trauma due to accidental slippage of catheter into urethra after placement -Coude' catheter placed under cystoscopy 8/18 and to remain in place for at least 10 days -pt needs to follow up with Dr. Tresa Moore for  voiding trial as outpt -post catheter UA abnormal but fu cx negative -Urology closely following since 8/21-has undergone frequent irrigation. As per urology, discontinued all platelets and anticoagulants for at least 2 weeks until hematuria resolves. The patient and spouse aware of small increased risk of stroke while off these medications. - Management per urology. Now on CBI. Improving  Sepsis due to CAP and H Flu Bacteremia / ESBL negative -Patient on antibiotics since 8/13. Received single dose of azithromycin, Rocephin and levofloxacin on 8/13. Completed 3 days of vancomycin 8/13-8/15. Has been on IV Zosyn since 8/14 (6 days). -peak lactic acid 4.7 but has normalized -PCT peaked at 2.76 -ID consulted and no indication for TEE for bacteremia -blood cx's repeated 8/19-negative to date -  discussed with infectious disease M.D. on call on 8/20 and switched antibiotics to oral Augmentin to complete additional 5 days treatment.   Acute respiratory failure with hypoxia due to Haemophilus influenza CAP and acute COPD exac -cont current anbx's  - Able to wean off of oxygen. -Continue flutter valve  -Taper Prednisone to 20 mg daily 8/19-have been decreasing by 10 mg daily -cont Xopenex PRN/DuoNeb QID -Continue O2 to maintain SpO2 > 93%   -cont Flutter valve - Follow chest x-ray 8/22.  HYPERTENSION with concentric hypertrophy -Continue metoprolol; continue hold other hypertensive medication.   -8/17: Was hypotensive when VR 130-170 but SBP back up to 130s with controlled VR so IVFs to kvo -marked decrease in edema especially penile/scrotal edema after 1x dose IV Lasix- Lasix at home was weekly dosed. Diuresing. Lasix resumed.  Atrial arrhythmias-PAF (paroxysmal atrial fibrillation)/SVT/A. flutter  -Cards had signed  off due to rhythm stability but returned 8/17 due to RVR -rate now controlled on q 6 hour Lopressor -Cardiology has spoken extensively with the pt who now agrees to begin Coumadin  8/19-antiplatelets and anticoagulants held secondary to hematuria and urinary retention secondary to same.  -Was on Plavix and ASA as prior to admit for AF and CAD. Cardiology discontinued Plavix and Lovenox bridge secondary to hematuria. Heparin added for DVT prophylaxis. Continue Coumadin. Discontinued all anticoagulants 8/21 secondary to hematuria/clots -TSH/Free T4 normal - Remains in sinus rhythm. [place] telemetry on 8/22.  MYOCARDIAL INFARCTION, HX OF/CORONARY ARTERY DISEASE  -per Cards  -Echocardiogram shows a mild left atrial dilation and mild left ventricle hypertrophy.    Type 2 diabetes, uncontrolled, with retinopathy and neuropathy  -Continue patient's customized insulin regimen except for:  Lunch NovoLog 12 units  Resistant SSI -CBG improved continue hold Actos and Amaryl secondary to multiple episodes of hypoglycemia -Cont home regimen since this was the only one which kept his hemoglobin A1c close to being within ADA guidelines. -CBGs finally < 200 since Prednisone tapered but has periodic fluctuations. Add Lantus. Monitor. Fluctuating-? Dietary indiscretion while here.  History of CVA (cerebrovascular accident)  HLD   -Continue Zocor 40 mg daily  -lipid panel this admit within ADA guidelines  Anasarca - Likely secondary to volume resuscitation early on admission and hypoalbuminemia. When necessary doses of IV Lasix and monitor. Lasix was held secondary to acute urinary issues-resumed 8/23.  Code Status: FULL Family Communication: Discussed with spouse at bedside on 08/04/14. Disposition Plan:  discharge home when medically stable.  Consultants: Dr. Doren Custard Nahser(cardiology)  Dr. Scharlene Gloss (Infectious disease) Urology  Procedure/Significant Events: 8/14 echocardiogram Left ventricle: mild concentric hypertrophy. LVEF=55% to 60%. -Ascending aorta: ascending aorta was mildly dilated. - Left atrium: mildly dilated.  Culture 8/13 blood right arm/hand positive for  (GNR) HAEMOPHILUS INFLUENZAE/ESBL negative 8/13 MRSA PCR negative  8/14 urine negative  Antibiotics: Azithromycin 8/13 >>> stopped 8/14  Ceftriaxone 8/13 >>> stopped 8/14  Levofloxacin x1 dose  Vancomycin 8./13>> stopped 8/16 Zosyn 8/14>> 8/20   oral Augmentin 8/20 >  DVT prophylaxis: Lovenox dc'd 8/18 due to hematuria SCDs  Objective: VITAL SIGNS: Temp: 97.8 F (36.6 C) (08/23 0553) Temp src: Oral (08/23 0553) BP: 135/76 mmHg (08/23 0553) Pulse Rate: 73 (08/23 0553) SPO2; 93% on RA   Intake/Output Summary (Last 24 hours) at 08/04/14 1405 Last data filed at 08/04/14 0657  Gross per 24 hour  Intake   9000 ml  Output    750 ml  Net   8250 ml   Exam: General: No resting respiratory distress. Ambulating in room this morning with assistance. Lungs:  clear to auscultation. No increased work of breathing. Cardiovascular: regular rate without murmur gallop or rub normal S1 and S2.  Abdomen: Nontender, nondistended, soft, bowel sounds positive, no rebound, no ascites, no appreciable mass.? Anterior abdominal wall edema. GU: Foley in place with maroon colored blood tinged urine but no clots Extremities: No significant cyanosis, clubbing. Bilateral 2+ pitting edema to groin bilaterally, mild scrotal edema - decreasing.   Data Reviewed: Basic Metabolic Panel:  Recent Labs Lab 07/29/14 0322 07/30/14 0300 07/31/14 0310 08/02/14 0605 08/03/14 0349  NA 138 141 144 143 142  K 4.1 3.8 4.4 3.4* 3.6*  CL 102 101 101 101 100  CO2 27 30 34* 35* 34*  GLUCOSE 304* 136* 122* 201* 164*  BUN 18 17 17 15 15   CREATININE 0.77 0.76 0.92 0.72 0.79  CALCIUM 8.7 8.7  8.9 8.8 8.7  MG 2.0  --   --   --   --    Liver Function Tests:  Recent Labs Lab 07/29/14 0322  AST 17  ALT 13  ALKPHOS 61  BILITOT 0.2*  PROT 5.8*  ALBUMIN 2.3*   CBC:  Recent Labs Lab 07/29/14 0322 07/30/14 0300 07/31/14 0310 08/02/14 0605  WBC 15.7* 13.5* 14.1* 8.0  NEUTROABS 14.3*  --   --   --   HGB  10.3* 10.7* 11.3* 11.0*  HCT 32.8* 33.0* 34.7* 34.8*  MCV 95.9 94.6 95.6 97.8  PLT 306 310 305 300    Cardiac Enzymes: No results found for this basename: CKTOTAL, CKMB, CKMBINDEX, TROPONINI,  in the last 168 hours BNP (last 3 results)  Recent Labs  07/25/14 1209  PROBNP 1647.0*   CBG:  Recent Labs Lab 08/03/14 1125 08/03/14 1626 08/03/14 2130 08/04/14 0549 08/04/14 1133  GLUCAP 195* 226* 193* 148* 321*    Recent Results (from the past 240 hour(s))  MRSA PCR SCREENING     Status: None   Collection Time    07/25/14  7:10 PM      Result Value Ref Range Status   MRSA by PCR NEGATIVE  NEGATIVE Final   Comment:            The GeneXpert MRSA Assay (FDA     approved for NASAL specimens     only), is one component of a     comprehensive MRSA colonization     surveillance program. It is not     intended to diagnose MRSA     infection nor to guide or     monitor treatment for     MRSA infections.  URINE CULTURE     Status: None   Collection Time    07/26/14  3:00 PM      Result Value Ref Range Status   Specimen Description URINE, CLEAN CATCH   Final   Special Requests NONE   Final   Culture  Setup Time     Final   Value: 07/26/2014 18:59     Performed at [place]     Final   Value: 1,000 COLONIES/ML     Performed at [facility]   Culture     Final   Value: INSIGNIFICANT GROWTH     Performed at [facility]   Report Status 07/27/2014 FINAL   Final  URINE CULTURE     Status: None   Collection Time    07/30/14  6:27 PM      Result Value Ref Range Status   Specimen Description URINE, CATHETERIZED   Final   Special Requests NONE   Final   Culture  Setup Time     Final   Value: 07/30/2014 20:41     Performed at [place]     Final   Value: NO GROWTH     Performed at [facility]   Culture     Final   Value: NO GROWTH     Performed at [facility]   Report Status 08/01/2014  FINAL   Final  CULTURE, BLOOD (ROUTINE X 2)     Status: None   Collection Time    07/31/14  3:10 AM      Result Value Ref Range Status   Specimen Description BLOOD RIGHT ARM   Final   Special Requests BOTTLES DRAWN AEROBIC  AND ANAEROBIC 5CC EACH   Final   Culture  Setup Time     Final   Value: 07/31/2014 08:52     Performed at [facility]   Culture     Final   Value:        BLOOD CULTURE RECEIVED NO GROWTH TO DATE CULTURE WILL BE HELD FOR 5 DAYS BEFORE ISSUING A FINAL NEGATIVE REPORT     Performed at [facility]   Report Status PENDING   Incomplete  CULTURE, BLOOD (ROUTINE X 2)     Status: None   Collection Time    07/31/14  3:25 AM      Result Value Ref Range Status   Specimen Description BLOOD RIGHT HAND   Final   Special Requests BOTTLES DRAWN AEROBIC AND ANAEROBIC 5CC EACH   Final   Culture  Setup Time     Final   Value: 07/31/2014 08:52     Performed at [facility]   Culture     Final   Value:        BLOOD CULTURE RECEIVED NO GROWTH TO DATE CULTURE WILL BE HELD FOR 5 DAYS BEFORE ISSUING A FINAL NEGATIVE REPORT     Performed at [facility]   Report Status PENDING   Incomplete     Studies:  Recent x-ray studies have been reviewed in detail by the Attending Physician  Scheduled Meds:  Scheduled Meds: . amoxicillin-clavulanate  1 tablet Oral Q12H  . furosemide  40 mg Intravenous Daily  . gabapentin  300 mg Oral BID  . insulin aspart  0-15 Units Subcutaneous TID WC  . insulin aspart  0-5 Units Subcutaneous QHS  . insulin aspart  10 Units Subcutaneous BID AC & HS  . insulin aspart  12 Units Subcutaneous QAC supper  . insulin aspart  12 Units Subcutaneous QAC lunch  . insulin glargine  10 Units Subcutaneous Daily  . levalbuterol  0.63 mg Nebulization TID  . losartan  100 mg Oral Daily  . metoprolol  25 mg Oral Q6H  . simvastatin  20 mg Oral q1800  . sodium chloride  3 mL Intravenous Q12H   Time spent on care of this patient: 25  mins  HONGALGI,ANAND, MD, FACP, FHM. [facility] Pager [phone]  If 7PM-7AM, please contact night-coverage [email or web address] Password [email or web address] 08/04/2014, 2:05 PM   LOS: 10 days

## 2014-08-04 NOTE — Progress Notes (Signed)
Pt has CBI in progress, results light pink. Pt seen by Dr Jeffie Pollock earlier. Irrigation slowed as per MD, continues to be light pink. Pt c/o feeling like catheter is clogging even tho urine is almost clear. I have left flow at slow but when I return to room the flow has been increased. Wife is at bedside and I have asked that she not adjust flow of CBI, that he is probably feeling increased pressure due to the rate being so fast. Rate again slowed with drainage at light pink.

## 2014-08-04 NOTE — Progress Notes (Signed)
Patient sleeping peacefully with CPAP in place. Continuous bladder irrigation flowing without difficulty. Foley is patent for blood-tinged urine, with a light pink color. Will continue to monitor.  Esperanza Heir, RN

## 2014-08-04 NOTE — Progress Notes (Signed)
Unable to determine u/o due to CBI

## 2014-08-04 NOTE — Progress Notes (Signed)
Dr Jeffie Pollock called to inform of inability to free foley drainage. Came to bedside, catheter replaced and pt attached to continuous irrigation with relief. Pt extremely anxious.

## 2014-08-04 NOTE — Progress Notes (Signed)
Unable to slow or stop CBI. If attempted, pt begins to complain that urine is "backing up", wife says she has to milk tubing to make output flow. Returns are clear with no clots seen. Any attempts to slow CBI are met with much anxiety by pt and wife

## 2014-08-05 LAB — GLUCOSE, CAPILLARY
GLUCOSE-CAPILLARY: 176 mg/dL — AB (ref 70–99)
GLUCOSE-CAPILLARY: 204 mg/dL — AB (ref 70–99)
GLUCOSE-CAPILLARY: 154 mg/dL — AB (ref 70–99)
Glucose-Capillary: 275 mg/dL — ABNORMAL HIGH (ref 70–99)

## 2014-08-05 LAB — BASIC METABOLIC PANEL
Anion gap: 7 (ref 5–15)
BUN: 12 mg/dL (ref 6–23)
CALCIUM: 8.7 mg/dL (ref 8.4–10.5)
CO2: 33 mEq/L — ABNORMAL HIGH (ref 19–32)
Chloride: 102 mEq/L (ref 96–112)
Creatinine, Ser: 0.8 mg/dL (ref 0.50–1.35)
GFR calc Af Amer: >90 mL/min (ref >90)
GFR, EST NON AFRICAN AMERICAN: 87 mL/min — AB (ref >90)
Glucose, Bld: 165 mg/dL — ABNORMAL HIGH (ref 70–99)
Potassium: 4.5 mEq/L (ref 3.7–5.3)
SODIUM: 142 meq/L (ref 137–147)

## 2014-08-05 NOTE — Progress Notes (Addendum)
Urine clear, CBI stopped.  Pt's wife concerned that the flow is not good enough, wife reassured, will make MD aware and continue to monitor.

## 2014-08-05 NOTE — Progress Notes (Signed)
MD ordered to irrigate with 30-60cc.  Urine bloody again, slow CBI in progress.  Will continue to monitor.

## 2014-08-05 NOTE — Progress Notes (Signed)
Progress Note  Charles [name] VOZ:[id number] DOB: 22-May-1941 DOA: 07/25/2014 PCP: [name], MD  Admit HPI / Brief Narrative: 73 y.o. M Hx Paroxysmal atrial fibrillation, obstructive sleep apnea, Hx stroke, Hx CAD, DM Type 2 with neuropathy, and retinopathy, HTN, HLD.  Endorsed was in his usual state of health when he started developing minimal shortness of breath. He reported "cold like symptoms". Over the next 2-3 days his symptoms progressively worsened. He did not have any cough initially but started coughing 24 hours prior to presentation and developed fever up to 101.25F. On the morning of admisison he woke up to go to the bathroom and became very short of breath, even with minimal exertion. He normally uses a CPAP every night and despite the use of CPAP he did not feel any better. His wife gave him her oxygen and one of her inhalers without any relief. Since his symptoms were not improving they called his primary care physician, who recommended that they call 911. When EMS arrived at the house he was found to be in respiratory distress. He was found to be cold, clammy, diaphoretic. Saturations initially were in 72%. He was placed on a nonrebreather and subsequently, on BiPAP. Was given Nebulizer treatments, steroids, and, he was brought into the emergency department.   In the emergency department it was noted that his blood pressure had been dropping. He was also tachycardic. Patient denied any dizziness, or any chest pain, per se. He was experiencing pleuritic type chest discomfort with coughing.  HPI/Subjective: No urinary pains- concerned re some blood still in urine. States Urology told him that they may have to 'look in" if bleeding continues. Leg swelling better.  Assessment/Plan:  Hematuria/severe urinary urgency/urinary retention secondary to blood clots -suspect foley trauma due to accidental slippage of catheter into urethra after placement -Coude' catheter placed under  cystoscopy 8/18 and to remain in place for at least 10 days -pt needs to follow up with Dr. Tresa Moore for voiding trial as outpt -post catheter UA abnormal but fu cx negative -Urology closely following since 8/21-has undergone frequent irrigation. As per urology, discontinued all platelets and anticoagulants for at least 2 weeks until hematuria resolves. The patient and spouse aware of small increased risk of stroke while off these medications. - Management per urology. CBI had been stopped only to resume slow irrigation due to hematuria.  Sepsis due to CAP and H Flu Bacteremia / ESBL negative -Patient on antibiotics since 8/13. Received single dose of azithromycin, Rocephin and levofloxacin on 8/13. Completed 3 days of vancomycin 8/13-8/15. Has been on IV Zosyn since 8/14 (6 days). -peak lactic acid 4.7 but has normalized -PCT peaked at 2.76 -ID consulted and no indication for TEE for bacteremia -blood cx's repeated 8/19-negative to date -  discussed with infectious disease M.D. on call on 8/20 and switched antibiotics to oral Augmentin to complete additional 5 days treatment- completes 8/24.   Acute respiratory failure with hypoxia due to Haemophilus influenza CAP and acute COPD exac -cont current anbx's  - Able to wean off of oxygen. -Continue flutter valve  -Taper Prednisone to 20 mg daily 8/19-have been decreasing by 10 mg daily -cont Xopenex PRN/DuoNeb QID -Continue O2 to maintain SpO2 > 93%   -cont Flutter valve   HYPERTENSION with concentric hypertrophy -Continue metoprolol; continue hold other hypertensive medication.   -8/17: Was hypotensive when VR 130-170 but SBP back up to 130s with controlled VR so IVFs to kvo -marked decrease in edema especially penile/scrotal edema  after 1x dose IV Lasix- Lasix at home was weekly dosed. Diuresing. Lasix resumed.  Atrial arrhythmias-PAF (paroxysmal atrial fibrillation)/SVT/A. flutter  -Cards had signed off due to rhythm stability but returned  8/17 due to RVR -rate now controlled on q 6 hour Lopressor -Cardiology has spoken extensively with the pt who now agrees to begin Coumadin 8/19-antiplatelets and anticoagulants held secondary to hematuria and urinary retention secondary to same.  -Was on Plavix and ASA as prior to admit for AF and CAD. Cardiology discontinued Plavix and Lovenox bridge secondary to hematuria. Heparin added for DVT prophylaxis. Continue Coumadin. Discontinued all anticoagulants 8/21 secondary to hematuria/clots -TSH/Free T4 normal - Remains in sinus rhythm. [place] telemetry on 8/22.  MYOCARDIAL INFARCTION, HX OF/CORONARY ARTERY DISEASE  -per Cards  -Echocardiogram shows a mild left atrial dilation and mild left ventricle hypertrophy.    Type 2 diabetes, uncontrolled, with retinopathy and neuropathy  -Continue patient's customized insulin regimen except for:  Lunch NovoLog 12 units  Resistant SSI -CBG improved continue hold Actos and Amaryl secondary to multiple episodes of hypoglycemia -Cont home regimen since this was the only one which kept his hemoglobin A1c close to being within ADA guidelines. -CBGs finally < 200 since Prednisone tapered but has periodic fluctuations. Add Lantus. Monitor. Fluctuating-? Dietary indiscretion while here.  History of CVA (cerebrovascular accident)  HLD   -Continue Zocor 40 mg daily  -lipid panel this admit within ADA guidelines  Anasarca - Likely secondary to volume resuscitation early on admission and hypoalbuminemia. Lasix was held secondary to acute urinary issues-resumed 8/23. Improving  Code Status: FULL Family Communication: Discussed with spouse at bedside. Disposition Plan:  discharge home when medically stable.  Consultants: Dr. Doren Custard Nahser(cardiology)  Dr. Scharlene Gloss (Infectious disease) Urology  Procedure/Significant Events: 8/14 echocardiogram Left ventricle: mild concentric hypertrophy. LVEF=55% to 60%. -Ascending aorta: ascending aorta was  mildly dilated. - Left atrium: mildly dilated.  Culture 8/13 blood right arm/hand positive for (GNR) HAEMOPHILUS INFLUENZAE/ESBL negative 8/13 MRSA PCR negative  8/14 urine negative  Antibiotics: Azithromycin 8/13 >>> stopped 8/14  Ceftriaxone 8/13 >>> stopped 8/14  Levofloxacin x1 dose  Vancomycin 8./13>> stopped 8/16 Zosyn 8/14>> 8/20   oral Augmentin 8/20 >  DVT prophylaxis: Lovenox dc'd 8/18 due to hematuria SCDs  Objective: VITAL SIGNS: Temp: 98.5 F (36.9 C) (08/24 0537) Temp src: Oral (08/24 0537) BP: 110/48 mmHg (08/24 1049) Pulse Rate: 65 (08/24 0537) SPO2; 93% on RA   Intake/Output Summary (Last 24 hours) at 08/05/14 1209 Last data filed at 08/05/14 0955  Gross per 24 hour  Intake   3363 ml  Output  11501 ml  Net  -8138 ml   Exam: General: No resting respiratory distress.  Lungs:  clear to auscultation. No increased work of breathing. Cardiovascular: regular rate without murmur gallop or rub normal S1 and S2.  Abdomen: Nontender, nondistended, soft, bowel sounds positive, no rebound, no ascites, no appreciable mass. GU: Foley in place with slightly blood tinged urine Extremities: No significant cyanosis, clubbing. Bilateral 1+ pitting edema to groin bilaterally, mild scrotal edema - decreasing.   Data Reviewed: Basic Metabolic Panel:  Recent Labs Lab 07/30/14 0300 07/31/14 0310 08/02/14 0605 08/03/14 0349 08/05/14 0447  NA 141 144 143 142 142  K 3.8 4.4 3.4* 3.6* 4.5  CL 101 101 101 100 102  CO2 30 34* 35* 34* 33*  GLUCOSE 136* 122* 201* 164* 165*  BUN 17 17 15 15 12   CREATININE 0.76 0.92 0.72 0.79 0.80  CALCIUM 8.7 8.9 8.8  8.7 8.7   Liver Function Tests: No results found for this basename: AST, ALT, ALKPHOS, BILITOT, PROT, ALBUMIN,  in the last 168 hours CBC:  Recent Labs Lab 07/30/14 0300 07/31/14 0310 08/02/14 0605  WBC 13.5* 14.1* 8.0  HGB 10.7* 11.3* 11.0*  HCT 33.0* 34.7* 34.8*  MCV 94.6 95.6 97.8  PLT 310 305 300     Cardiac Enzymes: No results found for this basename: CKTOTAL, CKMB, CKMBINDEX, TROPONINI,  in the last 168 hours BNP (last 3 results)  Recent Labs  07/25/14 1209  PROBNP 1647.0*   CBG:  Recent Labs Lab 08/04/14 0549 08/04/14 1133 08/04/14 1646 08/04/14 2114 08/05/14 0550  GLUCAP 148* 321* 233* 97 176*    Recent Results (from the past 240 hour(s))  URINE CULTURE     Status: None   Collection Time    07/26/14  3:00 PM      Result Value Ref Range Status   Specimen Description URINE, CLEAN CATCH   Final   Special Requests NONE   Final   Culture  Setup Time     Final   Value: 07/26/2014 18:59     Performed at [place]     Final   Value: 1,000 COLONIES/ML     Performed at [facility]   Culture     Final   Value: INSIGNIFICANT GROWTH     Performed at [facility]   Report Status 07/27/2014 FINAL   Final  URINE CULTURE     Status: None   Collection Time    07/30/14  6:27 PM      Result Value Ref Range Status   Specimen Description URINE, CATHETERIZED   Final   Special Requests NONE   Final   Culture  Setup Time     Final   Value: 07/30/2014 20:41     Performed at [place]     Final   Value: NO GROWTH     Performed at [facility]   Culture     Final   Value: NO GROWTH     Performed at [facility]   Report Status 08/01/2014 FINAL   Final  CULTURE, BLOOD (ROUTINE X 2)     Status: None   Collection Time    07/31/14  3:10 AM      Result Value Ref Range Status   Specimen Description BLOOD RIGHT ARM   Final   Special Requests BOTTLES DRAWN AEROBIC AND ANAEROBIC 5CC EACH   Final   Culture  Setup Time     Final   Value: 07/31/2014 08:52     Performed at [facility]   Culture     Final   Value:        BLOOD CULTURE RECEIVED NO GROWTH TO DATE CULTURE WILL BE HELD FOR 5 DAYS BEFORE ISSUING A FINAL NEGATIVE REPORT     Performed at [facility]   Report Status  PENDING   Incomplete  CULTURE, BLOOD (ROUTINE X 2)     Status: None   Collection Time    07/31/14  3:25 AM      Result Value Ref Range Status   Specimen Description BLOOD RIGHT HAND   Final   Special Requests BOTTLES DRAWN AEROBIC AND ANAEROBIC [facility] EACH   Final   Culture  Setup Time     Final   Value: 07/31/2014 08:52     Performed at  [facility]     Final   Value:        BLOOD CULTURE RECEIVED NO GROWTH TO DATE CULTURE WILL BE HELD FOR 5 DAYS BEFORE ISSUING A FINAL NEGATIVE REPORT     Performed at [facility]   Report Status PENDING   Incomplete     Studies:  Recent x-ray studies have been reviewed in detail by the Attending Physician  Scheduled Meds:  Scheduled Meds: . amoxicillin-clavulanate  1 tablet Oral Q12H  . furosemide  40 mg Intravenous Daily  . gabapentin  300 mg Oral BID  . insulin aspart  0-15 Units Subcutaneous TID WC  . insulin aspart  0-5 Units Subcutaneous QHS  . insulin aspart  10 Units Subcutaneous BID AC & HS  . insulin aspart  12 Units Subcutaneous QAC supper  . insulin aspart  12 Units Subcutaneous QAC lunch  . insulin glargine  10 Units Subcutaneous Daily  . levalbuterol  0.63 mg Nebulization TID  . losartan  100 mg Oral Daily  . metoprolol  25 mg Oral Q6H  . potassium chloride  40 mEq Oral Daily  . simvastatin  20 mg Oral q1800  . sodium chloride  3 mL Intravenous Q12H   Time spent on care of this patient: 25 mins  Yomaris Palecek, MD, FACP, FHM. [facility] Pager [phone]  If 7PM-7AM, please contact night-coverage [email or web address] Password [email or web address] 08/05/2014, 12:09 PM   LOS: 11 days

## 2014-08-05 NOTE — Progress Notes (Signed)
UR completed Damilola Flamm K. Florina Glas, RN, BSN, MSHL, CCM  08/05/2014 5:02 PM

## 2014-08-05 NOTE — Progress Notes (Signed)
PT Cancellation Note  Patient Details Name: Charles [name] MRN: [id number] DOB: 10-24-1941   Cancelled Treatment:    Reason Eval/Treat Not Completed: pt/wife politely decline PT at this time. They also deny need for further acute PT services. Will sign off. Thanks.  May benefit from HHPT follow up. Recommend daily mobility with nursing/family supervision for remainder of hospital stay.    Weston Anna, MPT Pager: [phone]

## 2014-08-05 NOTE — Progress Notes (Signed)
Patient ID: Charles [name], male   DOB: 17-Aug-1941, 73 y.o.   MRN: [id number]    Subjective: The CBI was running full open when I came in and the urine was very clear.   I reduced it to a slow drip and it was still clear.   On review of the nurses notes, the patient and family were concerned that every time the CBI was turned off he felt like the urine was backing up, but the urine stayed clear.   He was probably having irritation from the large stiff foley causing that sensation.   ROS:  Review of Systems  Constitutional: Negative for fever.  Gastrointestinal: Negative for abdominal pain.    Anti-infectives: Anti-infectives   Start     Dose/Rate Route Frequency Ordered Stop   08/01/14 1000  amoxicillin-clavulanate (AUGMENTIN) 875-125 MG per tablet 1 tablet     1 tablet Oral Every 12 hours 08/01/14 0948 08/06/14 0959   07/26/14 2000  piperacillin-tazobactam (ZOSYN) IVPB 3.375 g  Status:  Discontinued     3.375 g 12.5 mL/hr over 240 Minutes Intravenous Every 8 hours 07/26/14 1900 08/01/14 0947   07/26/14 0600  vancomycin (VANCOCIN) 1,500 mg in sodium chloride 0.9 % 500 mL IVPB  Status:  Discontinued     1,500 mg 250 mL/hr over 120 Minutes Intravenous Every 12 hours 07/25/14 1617 07/28/14 0908   07/25/14 2200  azithromycin (ZITHROMAX) 500 mg in dextrose 5 % 250 mL IVPB  Status:  Discontinued     500 mg 250 mL/hr over 60 Minutes Intravenous Every 24 hours 07/25/14 1907 07/26/14 1902   07/25/14 2000  cefTRIAXone (ROCEPHIN) 1 g in dextrose 5 % 50 mL IVPB  Status:  Discontinued     1 g 100 mL/hr over 30 Minutes Intravenous Every 24 hours 07/25/14 1907 07/26/14 1902   07/25/14 1630  vancomycin (VANCOCIN) 2,500 mg in sodium chloride 0.9 % 500 mL IVPB     2,500 mg 250 mL/hr over 120 Minutes Intravenous STAT 07/25/14 1616 07/25/14 1830   07/25/14 1430  levofloxacin (LEVAQUIN) IVPB 750 mg     750 mg 100 mL/hr over 90 Minutes Intravenous  Once 07/25/14 1415 07/25/14 1611      Current  Facility-Administered Medications  Medication Dose Route Frequency Provider Last Rate Last Dose  . acetaminophen (TYLENOL) tablet 650 mg  650 mg Oral Q6H PRN Bonnielee Haff, MD   650 mg at 08/02/14 1427   Or  . acetaminophen (TYLENOL) suppository 650 mg  650 mg Rectal Q6H PRN Bonnielee Haff, MD      . amoxicillin-clavulanate (AUGMENTIN) 875-125 MG per tablet 1 tablet  1 tablet Oral Q12H Modena Jansky, MD   1 tablet at 08/04/14 2144  . furosemide (LASIX) injection 40 mg  40 mg Intravenous Daily Modena Jansky, MD   40 mg at 08/04/14 1039  . gabapentin (NEURONTIN) capsule 300 mg  300 mg Oral BID Bonnielee Haff, MD   300 mg at 08/04/14 2144  . insulin aspart (novoLOG) injection 0-15 Units  0-15 Units Subcutaneous TID WC Modena Jansky, MD   3 Units at 08/05/14 [phone]  . insulin aspart (novoLOG) injection 0-5 Units  0-5 Units Subcutaneous QHS Modena Jansky, MD   2 Units at 08/02/14 2226  . insulin aspart (novoLOG) injection 10 Units  10 Units Subcutaneous BID AC & HS Allie Bossier, MD   10 Units at 08/04/14 0815  . insulin aspart (novoLOG) injection 12 Units  12 Units Subcutaneous QAC  supper Allie Bossier, MD   12 Units at 08/04/14 1720  . insulin aspart (novoLOG) injection 12 Units  12 Units Subcutaneous QAC lunch Allie Bossier, MD   12 Units at 08/04/14 1324  . insulin glargine (LANTUS) injection 10 Units  10 Units Subcutaneous Daily Modena Jansky, MD   10 Units at 08/04/14 1040  . levalbuterol (XOPENEX) nebulizer solution 0.63 mg  0.63 mg Nebulization Q4H PRN Bonnielee Haff, MD   0.63 mg at 07/29/14 1755  . levalbuterol (XOPENEX) nebulizer solution 0.63 mg  0.63 mg Nebulization TID Allie Bossier, MD   0.63 mg at 08/04/14 2125  . losartan (COZAAR) tablet 100 mg  100 mg Oral Daily Samella Parr, NP   100 mg at 08/04/14 1038  . metoprolol (LOPRESSOR) injection 2.5 mg  2.5 mg Intravenous Q5 min PRN Samella Parr, NP   2.5 mg at 07/29/14 1055  . metoprolol tartrate (LOPRESSOR) tablet  25 mg  25 mg Oral Q6H Samella Parr, NP   25 mg at 08/05/14 0438  . morphine 2 MG/ML injection 2-4 mg  2-4 mg Intravenous Q4H PRN Allie Bossier, MD   2 mg at 08/02/14 1633  . ondansetron (ZOFRAN) tablet 4 mg  4 mg Oral Q6H PRN Bonnielee Haff, MD       Or  . ondansetron (ZOFRAN) injection 4 mg  4 mg Intravenous Q6H PRN Bonnielee Haff, MD      . oxybutynin (DITROPAN) tablet 10 mg  10 mg Oral Q8H PRN Alexis Frock, MD   10 mg at 08/03/14 8099  . oxyCODONE (Oxy IR/ROXICODONE) immediate release tablet 5 mg  5 mg Oral Q4H PRN Bonnielee Haff, MD   5 mg at 08/04/14 2144  . potassium chloride SA (K-DUR,KLOR-CON) CR tablet 40 mEq  40 mEq Oral Daily Modena Jansky, MD   40 mEq at 08/04/14 1721  . simvastatin (ZOCOR) tablet 20 mg  20 mg Oral q1800 Bonnielee Haff, MD   20 mg at 08/04/14 1721  . sodium chloride 0.9 % injection 3 mL  3 mL Intravenous Q12H Bonnielee Haff, MD   3 mL at 08/04/14 2147     Objective: Vital signs in last 24 hours: Temp:  [98.5 F (36.9 C)-99.1 F (37.3 C)] 98.5 F (36.9 C) (08/24 0537) Pulse Rate:  [65-74] 65 (08/24 0537) Resp:  [17-22] 22 (08/24 0537) BP: (119-128)/(43-60) 127/43 mmHg (08/24 0537) SpO2:  [91 %-98 %] 91 % (08/24 0537) Weight:  [163.3 kg (360 lb 0.2 oz)] 163.3 kg (360 lb 0.2 oz) (08/24 0537)  Intake/Output from previous day: 08/23 0701 - 08/24 0700 In: 3243 [P.O.:240; I.V.:3] Out: 9150 [Urine:9150] Intake/Output this shift:     Physical Exam  Constitutional: He is well-developed, well-nourished, and in no distress.  Abdominal: Soft. There is no tenderness.  Genitourinary:  Foley in place drain clear urine on CBI.    Lab Results:  No results found for this basename: WBC, HGB, HCT, PLT,  in the last 72 hours BMET  Recent Labs  08/03/14 0349 08/05/14 0447  NA 142 142  K 3.6* 4.5  CL 100 102  CO2 34* 33*  GLUCOSE 164* 165*  BUN 15 12  CREATININE 0.79 0.80  CALCIUM 8.7 8.7   PT/INR  Recent Labs  08/03/14 0349  LABPROT 16.7*   INR 1.35   ABG No results found for this basename: PHART, PCO2, PO2, HCO3,  in the last 72 hours  Studies/Results: No results found.  Labs  reviewed.    Assessment: Foley trauma with hematuria that has resolved with the hematuria catheter and CBI.   Plan: I am going to stop the CBI and if the urine remains clear the irrigation port can be plugged in preparation for discharge.      LOS: 11 days    Jasiel Apachito J 08/05/2014

## 2014-08-05 NOTE — Progress Notes (Addendum)
Urine is bloody.  As instructed by MD very slow CBI started back.  Will continue to monitor.

## 2014-08-05 NOTE — Progress Notes (Signed)
Subjective: No complaints  Objective: Vital signs in last 24 hours: Temp:  [98.5 F (36.9 C)-99.1 F (37.3 C)] 98.5 F (36.9 C) (08/24 0537) Pulse Rate:  [65-74] 65 (08/24 0537) Resp:  [17-22] 22 (08/24 0537) BP: (119-128)/(43-60) 127/43 mmHg (08/24 0537) SpO2:  [91 %-98 %] 95 % (08/24 0958) Weight:  [360 lb 0.2 oz (163.3 kg)] 360 lb 0.2 oz (163.3 kg) (08/24 0537) Weight change: -18 lb 15.8 oz (-8.613 kg) Last BM Date: 08/04/14 Intake/Output from previous day: 08/23 0701 - 08/24 0700 In: 3243 [P.O.:240; I.V.:3] Out: 1025 [Urine:9150] Intake/Output this shift: Total I/O In: 120 [P.O.:120] Out: 2351 [Urine:2350; Stool:1]  PE: General:Pleasant affect, NAD Skin:Warm and dry, brisk capillary refill HEENT:normocephalic, sclera clear, mucus membranes moist Neck:supple, no JVD  Heart:S1S2 RRR without murmur, gallup, rub or click Lungs:clear ant,  without rales, rhonchi, or wheezes ENI:DPOEU, soft, non tender, + BS, do not palpate liver spleen or masses Ext:tr lower ext edema, 2+ pedal pulses, 2+ radial pulses Neuro:alert and oriented, MAE, follows commands, + facial symmetry  No Tele.  Lab Results: No results found for this basename: WBC, HGB, HCT, PLT,  in the last 72 hours BMET  Recent Labs  08/03/14 0349 08/05/14 0447  NA 142 142  K 3.6* 4.5  CL 100 102  CO2 34* 33*  GLUCOSE 164* 165*  BUN 15 12  CREATININE 0.79 0.80  CALCIUM 8.7 8.7   No results found for this basename: TROPONINI, CK, MB,  in the last 72 hours  Lab Results  Component Value Date   CHOL 121 07/28/2014   HDL 42 07/28/2014   LDLCALC 69 07/28/2014   TRIG 48 07/28/2014   CHOLHDL 2.9 07/28/2014   Lab Results  Component Value Date   HGBA1C 7.3* 07/26/2014     Lab Results  Component Value Date   TSH 1.680 07/29/2014    Studies/Results: No results found.  Medications: I have reviewed the patient's current medications. Scheduled Meds: . amoxicillin-clavulanate  1 tablet Oral Q12H    . furosemide  40 mg Intravenous Daily  . gabapentin  300 mg Oral BID  . insulin aspart  0-15 Units Subcutaneous TID WC  . insulin aspart  0-5 Units Subcutaneous QHS  . insulin aspart  10 Units Subcutaneous BID AC & HS  . insulin aspart  12 Units Subcutaneous QAC supper  . insulin aspart  12 Units Subcutaneous QAC lunch  . insulin glargine  10 Units Subcutaneous Daily  . levalbuterol  0.63 mg Nebulization TID  . losartan  100 mg Oral Daily  . metoprolol  25 mg Oral Q6H  . potassium chloride  40 mEq Oral Daily  . simvastatin  20 mg Oral q1800  . sodium chloride  3 mL Intravenous Q12H   Continuous Infusions:  PRN Meds:.acetaminophen, acetaminophen, levalbuterol, metoprolol, morphine injection, ondansetron (ZOFRAN) IV, ondansetron, oxybutynin, oxyCODONE  Assessment/Plan:  Patient Profile:  73 year old male with history of CAD (MI 29), PAF, DM with peripheral neuropathy, OSA on CPAP, HTN, CVA (2002), and morbid obesity (BMI 42.62) who was brought to [facility] via EMS 2/2 hypoxic respiratory failure, fever, chills and felt to have CAP. Noted to be tachycardic and hypotensive in the ED which improved with IV fluids. Also with paroxsymal atrial fibrillation w/ RVR with possible additional SVT vs atrial flutter this admission.  Back in NSR. Bladder issues including pain, spasms and hematuria predominate at this point.    1. Atrial arrhythmias: no tele,  HR regualr  - Appears that he has gone in and out of afib with RVR with possible additional SVT vs atrial flutter this admission  - Continue Lopressor 25mg  q6 for rate control  - Warfarin started for stroke prophylaxis. OK to stop warfarin for now.  2.CAP with sepsis,  -Has improved 2/2 to antibiotic therapy. Continue treatment per IM.  3.Hypotension:  -Resolved, has resumed b-blocker  4. CAD s/p stent 2000, NSTEMI 2013 treated medically - negative troponins this admit -On aspirin,, b-blocker, statin,  -TC 121, LDL 69, HDL 42, trigs 48  5.  Diabetes mellitus with retinopathy/neuropathy  -per IM  6. Anticoagulation: Planning longterm anticoagulation with coumadin. Plavix stopped. Lovenox stopped. Hold all anticoagulation until ok to restart by urology. WOuld likely use COumadin alone without lovenox bridge. Pt is concerned about coumadin.  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  He had previously stopped Xarelto due to possible "rash and [employer] on [employer]".  Cha2ds2Vasc is 6 - pt is willing to take risk of CVA.    currently off coumadin. 7.  Acute respiratory failure with hypoxia due to Haemophilus influenza CAP and acute COPD exac- tapering prednisone, completeing ABX. , on O2. 8. Hematuria- foley trauma, large foley placed due to significant clots. Urine was clear today, irrigation stopped, urin now red.      LOS: 11 days   Time spent with pt. :15 minutes. [facility] R  Nurse Practitioner Certified Pager [phone] or after 5pm and on weekends call [phone] 08/05/2014, 10:45 AM   Patient seen, examined. Available data reviewed. Agree with findings, assessment, and plan as outlined by Cecilie Kicks, PA-C. Exam reveals morbidly obese male in NAD. Lungs CTA, heart RRR without murmur, extremities with diffuse 1+ edema. Lengthy discussion with patient. He is absolutely NOT interested in taking warfarin or novel anticoagulant drugs for stroke prevention in the setting of PAF. He understands the risks and states 'I just want to enjoy what time I have left.'  No further recommendations from cardiac perspective. Would be reasonable to resume plavix when cleared by urology (this is what he's going to do whether we recommend it or not).  Sherren Mocha, M.D. 08/05/2014 2:02 PM

## 2014-08-06 LAB — CULTURE, BLOOD (ROUTINE X 2)
Culture: NO GROWTH
Culture: NO GROWTH

## 2014-08-06 LAB — GLUCOSE, CAPILLARY
GLUCOSE-CAPILLARY: 201 mg/dL — AB (ref 70–99)
Glucose-Capillary: 232 mg/dL — ABNORMAL HIGH (ref 70–99)

## 2014-08-06 MED ORDER — ACETAMINOPHEN 325 MG PO TABS: 650.0000 mg | ORAL_TABLET | Freq: Four times a day (QID) | ORAL | Status: DC | PRN

## 2014-08-06 MED ORDER — POTASSIUM CHLORIDE CRYS ER 20 MEQ PO TBCR: 40.0000 meq | EXTENDED_RELEASE_TABLET | Freq: Every day | ORAL | Status: DC

## 2014-08-06 MED ORDER — OXYBUTYNIN CHLORIDE 5 MG PO TABS: 10.0000 mg | ORAL_TABLET | Freq: Three times a day (TID) | ORAL | Status: DC | PRN

## 2014-08-06 MED ORDER — FUROSEMIDE 80 MG PO TABS: 40.0000 mg | ORAL_TABLET | Freq: Every day | ORAL | Status: DC

## 2014-08-06 MED ORDER — OXYCODONE HCL 5 MG PO TABS: 5.0000 mg | ORAL_TABLET | Freq: Four times a day (QID) | ORAL | Status: DC | PRN

## 2014-08-06 NOTE — Plan of Care (Signed)
Problem: Phase III Progression Outcomes Goal: Voiding independently Outcome: Not Met (add Reason) Pt.has a special urinary catheter that was placed by urology. Pt.will go home with this urinary catheter in place.     

## 2014-08-06 NOTE — Progress Notes (Signed)
Patient ID: Charles [name], male   DOB: January 15, 1941, 73 y.o.   MRN: [id number]    Subjective: Urine clear on minimal CBI.  He is without complaints ROS:  Review of Systems  Constitutional: Negative for fever.  Gastrointestinal: Negative for abdominal pain.  Genitourinary: Negative for dysuria.    Anti-infectives: Anti-infectives   Start     Dose/Rate Route Frequency Ordered Stop   08/01/14 1000  amoxicillin-clavulanate (AUGMENTIN) 875-125 MG per tablet 1 tablet     1 tablet Oral Every 12 hours 08/01/14 0948 08/05/14 2215   07/26/14 2000  piperacillin-tazobactam (ZOSYN) IVPB 3.375 g  Status:  Discontinued     3.375 g 12.5 mL/hr over 240 Minutes Intravenous Every 8 hours 07/26/14 1900 08/01/14 0947   07/26/14 0600  vancomycin (VANCOCIN) 1,500 mg in sodium chloride 0.9 % 500 mL IVPB  Status:  Discontinued     1,500 mg 250 mL/hr over 120 Minutes Intravenous Every 12 hours 07/25/14 1617 07/28/14 0908   07/25/14 2200  azithromycin (ZITHROMAX) 500 mg in dextrose 5 % 250 mL IVPB  Status:  Discontinued     500 mg 250 mL/hr over 60 Minutes Intravenous Every 24 hours 07/25/14 1907 07/26/14 1902   07/25/14 2000  cefTRIAXone (ROCEPHIN) 1 g in dextrose 5 % 50 mL IVPB  Status:  Discontinued     1 g 100 mL/hr over 30 Minutes Intravenous Every 24 hours 07/25/14 1907 07/26/14 1902   07/25/14 1630  vancomycin (VANCOCIN) 2,500 mg in sodium chloride 0.9 % 500 mL IVPB     2,500 mg 250 mL/hr over 120 Minutes Intravenous STAT 07/25/14 1616 07/25/14 1830   07/25/14 1430  levofloxacin (LEVAQUIN) IVPB 750 mg     750 mg 100 mL/hr over 90 Minutes Intravenous  Once 07/25/14 1415 07/25/14 1611      Current Facility-Administered Medications  Medication Dose Route Frequency Provider Last Rate Last Dose  . acetaminophen (TYLENOL) tablet 650 mg  650 mg Oral Q6H PRN Bonnielee Haff, MD   650 mg at 08/02/14 1427   Or  . acetaminophen (TYLENOL) suppository 650 mg  650 mg Rectal Q6H PRN Bonnielee Haff, MD      .  furosemide (LASIX) injection 40 mg  40 mg Intravenous Daily Modena Jansky, MD   40 mg at 08/05/14 1041  . gabapentin (NEURONTIN) capsule 300 mg  300 mg Oral BID Bonnielee Haff, MD   300 mg at 08/05/14 2215  . insulin aspart (novoLOG) injection 0-15 Units  0-15 Units Subcutaneous TID WC Modena Jansky, MD   5 Units at 08/05/14 1801  . insulin aspart (novoLOG) injection 0-5 Units  0-5 Units Subcutaneous QHS Modena Jansky, MD   2 Units at 08/02/14 2226  . insulin aspart (novoLOG) injection 10 Units  10 Units Subcutaneous BID AC & HS Allie Bossier, MD   10 Units at 08/04/14 0815  . insulin aspart (novoLOG) injection 12 Units  12 Units Subcutaneous QAC supper Allie Bossier, MD   12 Units at 08/05/14 1800  . insulin aspart (novoLOG) injection 12 Units  12 Units Subcutaneous QAC lunch Allie Bossier, MD   12 Units at 08/05/14 1239  . insulin glargine (LANTUS) injection 10 Units  10 Units Subcutaneous Daily Modena Jansky, MD   10 Units at 08/04/14 1040  . levalbuterol (XOPENEX) nebulizer solution 0.63 mg  0.63 mg Nebulization Q4H PRN Bonnielee Haff, MD   0.63 mg at 07/29/14 1755  . levalbuterol (XOPENEX) nebulizer solution 0.63 mg  0.63 mg Nebulization TID Allie Bossier, MD   0.63 mg at 08/05/14 2002  . losartan (COZAAR) tablet 100 mg  100 mg Oral Daily Samella Parr, NP   100 mg at 08/05/14 1040  . metoprolol (LOPRESSOR) injection 2.5 mg  2.5 mg Intravenous Q5 min PRN Samella Parr, NP   2.5 mg at 07/29/14 1055  . metoprolol tartrate (LOPRESSOR) tablet 25 mg  25 mg Oral Q6H Samella Parr, NP   25 mg at 08/05/14 2215  . morphine 2 MG/ML injection 2-4 mg  2-4 mg Intravenous Q4H PRN Allie Bossier, MD   2 mg at 08/02/14 1633  . ondansetron (ZOFRAN) tablet 4 mg  4 mg Oral Q6H PRN Bonnielee Haff, MD       Or  . ondansetron (ZOFRAN) injection 4 mg  4 mg Intravenous Q6H PRN Bonnielee Haff, MD      . oxybutynin (DITROPAN) tablet 10 mg  10 mg Oral Q8H PRN Alexis Frock, MD   10 mg at 08/03/14  [id number]  . oxyCODONE (Oxy IR/ROXICODONE) immediate release tablet 5 mg  5 mg Oral Q4H PRN Bonnielee Haff, MD   5 mg at 08/05/14 2215  . potassium chloride SA (K-DUR,KLOR-CON) CR tablet 40 mEq  40 mEq Oral Daily Modena Jansky, MD   40 mEq at 08/05/14 1041  . simvastatin (ZOCOR) tablet 20 mg  20 mg Oral q1800 Bonnielee Haff, MD   20 mg at 08/05/14 1800  . sodium chloride 0.9 % injection 3 mL  3 mL Intravenous Q12H Bonnielee Haff, MD   3 mL at 08/05/14 1041     Objective: Vital signs in last 24 hours: Temp:  [98.6 F (37 C)-98.8 F (37.1 C)] 98.8 F (37.1 C) (08/25 0637) Pulse Rate:  [74-86] 74 (08/25 0637) Resp:  [18-19] 19 (08/25 0637) BP: (104-130)/(35-48) 130/42 mmHg (08/25 0637) SpO2:  [93 %-96 %] 93 % (08/25 0637) Weight:  [163.1 kg (359 lb 9.1 oz)] 163.1 kg (359 lb 9.1 oz) (08/25 9470)  Intake/Output from previous day: 08/24 0701 - 08/25 0700 In: 360 [P.O.:360] Out: 9351 [Urine:9350; Stool:1] Intake/Output this shift:     Physical Exam  Constitutional: He is well-developed, well-nourished, and in no distress.  Abdominal: Soft. There is no tenderness.  Genitourinary:  Foley draining clear fluid on minimal CBI.     Lab Results:  No results found for this basename: WBC, HGB, HCT, PLT,  in the last 72 hours BMET  Recent Labs  08/05/14 0447  NA 142  K 4.5  CL 102  CO2 33*  GLUCOSE 165*  BUN 12  CREATININE 0.80  CALCIUM 8.7   PT/INR No results found for this basename: LABPROT, INR,  in the last 72 hours ABG No results found for this basename: PHART, PCO2, PO2, HCO3,  in the last 72 hours  Studies/Results: No results found.   Assessment: He has clear urine on minimal CBI  Plan: Plug irrigation port. He can be discharged with the foley. I would like for him to stay off of anticoagulants/antiplatelet drugs until we remove the foley in 1-2 weeks.      LOS: 12 days    Ritta Hammes J 08/06/2014

## 2014-08-06 NOTE — Discharge Summary (Signed)
Physician Discharge Summary  Charles [name] GXQ:[id number] DOB: 10/15/1941 DOA: 07/25/2014  PCP: Charles Kehr, MD  Admit date: 07/25/2014 Discharge date: 08/06/2014  Time spent: Greater than 30 minutes  Recommendations for Outpatient Follow-up:  1. Dr. Irine [name], Urology in one to 2 weeks. 2. Dr. Walker [name], PCP in 5 days with repeat labs (CBC and BMP. 3. Dr. Dorris [name], Cardiology 4. Home health PT & RN 5. Continue nightly CPAP as per prior home settings. 6. Patient will be discharged with indwelling Foley catheter. 7. Recommend followup chest x-ray in 4-6 weeks.  Discharge Diagnoses:  Active Problems:   HYPERTENSION   MYOCARDIAL INFARCTION, HX OF   CORONARY ARTERY DISEASE   History of CVA (cerebrovascular accident)   Type 2 diabetes, uncontrolled, with retinopathy   PAF (paroxysmal atrial fibrillation)   CAP (community acquired pneumonia)   Acute respiratory failure with hypoxia   Septic shock   Hypotension   COPD    Urinary retention   Discharge Condition: Improved & Stable  Diet recommendation: Heart healthy and diabetic diet.  Filed Weights   08/04/14 0553 08/05/14 0537 08/06/14 0637  Weight: 171.913 kg (379 lb) 163.3 kg (360 lb 0.2 oz) 163.1 kg (359 lb 9.1 oz)    History of present illness:  73 y.o. M Hx Paroxysmal atrial fibrillation, obstructive sleep apnea, Hx stroke, Hx CAD, DM Type 2 with neuropathy, and retinopathy, HTN, HLD.  Endorsed was in his usual state of health when he started developing minimal shortness of breath. He reported "cold like symptoms". Over the next 2-3 days his symptoms progressively worsened. He did not have any cough initially but started coughing 24 hours prior to presentation and developed fever up to 101.42F. On the morning of admisison he woke up to go to the bathroom and became very short of breath, even with minimal exertion. He normally uses a CPAP every night and despite the use of CPAP he did not feel any better. His wife  gave him her oxygen and one of her inhalers without any relief. Since his symptoms were not improving they called his primary care physician, who recommended that they call 911. When EMS arrived at the house he was found to be in respiratory distress. He was found to be cold, clammy, diaphoretic. Saturations initially were in 72%. He was placed on a nonrebreather and subsequently, on BiPAP. Was given Nebulizer treatments, steroids, and, he was brought into the emergency department.  In the emergency department it was noted that his blood pressure had been dropping. He was also tachycardic. Patient denied any dizziness, or any chest pain, per se. He was experiencing pleuritic type chest discomfort with coughing.   Hospital Course:   Hematuria/severe urinary urgency/urinary retention secondary to blood clots  -suspect foley trauma due to accidental slippage of catheter into urethra after placement  -Initially Coude' catheter placed under cystoscopy 8/18. Patient over the last couple days prior to discharge developed significant worsening hematuria, urinary retention, painful voiding. Urology continue to closely follow patient. His catheter was changed. He was placed on continuous bladder irrigation. All antiplatelets and anticoagulants have been discontinued. Improved - Urology has cleared patient for discharge with Foley catheter until followup in office in one to 2 weeks. -post catheter UA abnormal but fu cx negative   Sepsis due to CAP and H Flu Bacteremia / ESBL negative  -Patient on antibiotics since 8/13. Received single dose of azithromycin, Rocephin and levofloxacin on 8/13. Completed 3 days of vancomycin 8/13-8/15. Has been on  IV Zosyn since 8/14 (6 days).  -peak lactic acid 4.7 but has normalized  -PCT peaked at 2.76  -ID consulted and no indication for TEE for bacteremia  -blood cx's repeated 8/19-negative to date  - discussed with infectious disease M.D. on call on 8/20 and switched  antibiotics to oral Augmentin and completed additional 5 days as per ID recommendation.  Acute respiratory failure with hypoxia due to Haemophilus influenza CAP and acute COPD exac  -Patient completed course of antibiotics and steroids. He was treated in usual fashion with bronchodilator nebulizations, oxygen and flutter valve. Acute respiratory failure resolved.  HYPERTENSION with concentric hypertrophy  - Controlled on metoprolol.  Atrial arrhythmias-PAF (paroxysmal atrial fibrillation)/SVT/A. flutter  -Cardiology was consulted. Patient appeared to go in and out of A. fib with RVR with possible additional SVT versus atrial flutter this admission. Rate controlled on metoprolol-continue previous home dose. Patient was on Plavix and aspirin prior to admission for atrial fibrillation and CAD. Initially Plavix was discontinued. After discussing with patient, he was started on Lovenox bridging and Coumadin. In the interim his hematuria got worse. Urology recommended discontinuing all anticoagulants and antiplatelet. Patient finally stated that he is absolutely not interested in taking warfarin or novel anticoagulants for stroke prevention in the setting of PAF. He understands the risks and states "I just want to enjoy what time I have left". Would be reasonable to resume Plavix when cleared by urology.  -TSH/Free T4 normal   MYOCARDIAL INFARCTION, HX OF/CORONARY ARTERY DISEASE  -per Cards  -Echocardiogram shows a mild left atrial dilation and mild left ventricle hypertrophy.   Type 2 diabetes, uncontrolled, with retinopathy and neuropathy  -Patient was on oral hypoglycemics and customized sliding scale regimen at home. Sliding scale insulin was continued in the hospital and low-dose Lantus was added. At discharge, patient will be discharged on his oral hypoglycemics except Actos (held secondary to fluid retention) and SSI. This can be further titrated outpatient as deemed necessary.  History of CVA  (cerebrovascular accident)  HLD  -Continue Zocor 40 mg daily  -lipid panel this admit within ADA guidelines   Anasarca  - Likely secondary to volume resuscitation early on admission and hypoalbuminemia. Lasix was held secondary to acute urinary issues-resumed 8/23. Improving   Consultations:  Cardiology  Infectious disease  Urology  Procedures:  Foley catheter    Discharge Exam:  Complaints: Denies complaints. Anxious to go home.  Filed Vitals:   08/05/14 2100 08/06/14 0637 08/06/14 0828 08/06/14 0946  BP: 104/48 130/42  124/46  Pulse: 86 74  85  Temp: 98.7 F (37.1 C) 98.8 F (37.1 C)  98.4 F (36.9 C)  TempSrc: Oral Oral  Oral  Resp: 18 19  18   Height:      Weight:  163.1 kg (359 lb 9.1 oz)    SpO2: 94% 93% 95% 94%    General: No resting respiratory distress.  Lungs: clear to auscultation. No increased work of breathing.  Cardiovascular: regular rate without murmur gallop or rub normal S1 and S2.  Abdomen: Nontender, nondistended, soft, bowel sounds positive, no rebound, no ascites, no appreciable mass.  GU: Foley in place with slightly blood tinged urine  Extremities: No significant cyanosis, clubbing. Bilateral 1+ pitting edema to groin bilaterally, mild scrotal edema - decreasing.    Discharge Instructions      Discharge Instructions   Call MD for:  difficulty breathing, headache or visual disturbances    Complete by:  As directed      Call  MD for:  severe uncontrolled pain    Complete by:  As directed      Call MD for:  temperature >100.4    Complete by:  As directed      Call MD for:    Complete by:  As directed   Worsening blood in urine or difficulty urinating.     Diet - low sodium heart healthy    Complete by:  As directed      Diet Carb Modified    Complete by:  As directed      Discharge instructions    Complete by:  As directed   Continue nightly CPAP as prior to admission.     Increase activity slowly    Complete by:  As directed              Medication List    STOP taking these medications       aspirin 81 MG tablet     clopidogrel 75 MG tablet  Commonly known as:  PLAVIX     guaiFENesin 600 MG 12 hr tablet  Commonly known as:  MUCINEX     ibuprofen 200 MG tablet  Commonly known as:  ADVIL,MOTRIN     NYQUIL PO     pioglitazone 45 MG tablet  Commonly known as:  ACTOS     triamcinolone cream 0.5 %  Commonly known as:  KENALOG     VICKS DAYQUIL COUGH 15 MG/15ML Liqd  Generic drug:  Dextromethorphan HBr      TAKE these medications       acetaminophen 325 MG tablet  Commonly known as:  TYLENOL  Take 2 tablets (650 mg total) by mouth every 6 (six) hours as needed for mild pain, fever or headache (or Fever >/= 101).     EQL VITAMIN D3 1000 UNITS tablet  Generic drug:  Cholecalciferol  Take 1,000 Units by mouth daily.     furosemide 80 MG tablet  Commonly known as:  LASIX  Take 0.5 tablets (40 mg total) by mouth daily.     gabapentin 300 MG capsule  Commonly known as:  NEURONTIN  Take 300 mg by mouth 2 (two) times daily.     glimepiride 4 MG tablet  Commonly known as:  AMARYL  Take 1 tablet (4 mg total) by mouth 2 (two) times daily.     HUMALOG KWIKPEN 100 UNIT/ML KiwkPen  Generic drug:  insulin lispro  Inject 5-20 Units into the skin 4 (four) times daily - after meals and at bedtime. Per sliding scale     losartan 100 MG tablet  Commonly known as:  COZAAR  Take 1 tablet (100 mg total) by mouth daily.     metoprolol 50 MG tablet  Commonly known as:  LOPRESSOR  Take 1 tablet (50 mg total) by mouth 2 (two) times daily.     nitroGLYCERIN 0.4 MG SL tablet  Commonly known as:  NITROSTAT  Place 0.4 mg under the tongue every 5 (five) minutes as needed. For chest pain     oxybutynin 5 MG tablet  Commonly known as:  DITROPAN  Take 2 tablets (10 mg total) by mouth every 8 (eight) hours as needed for bladder spasms.     oxyCODONE 5 MG immediate release tablet  Commonly known as:  Oxy  IR/ROXICODONE  Take 1 tablet (5 mg total) by mouth every 6 (six) hours as needed for moderate pain or severe pain.     potassium chloride SA 20 MEQ  tablet  Commonly known as:  K-DUR,KLOR-CON  Take 2 tablets (40 mEq total) by mouth daily.     pravastatin 40 MG tablet  Commonly known as:  PRAVACHOL  Take 1 tablet (40 mg total) by mouth daily.     vitamin B-12 1000 MCG tablet  Commonly known as:  CYANOCOBALAMIN  Take 1,000 mcg by mouth daily.       Follow-up Information   Call Malka So, MD. (for f/u for 1-2 weeks.   If you can't get in with me you could see our nurse practitioner, Jimmey Ralph. )    Specialty:  Urology   Contact information:   [place] [address] [phone]       Follow up with Charles Kehr, MD. Schedule an appointment as soon as possible for a visit in 5 days. (To be seen with repeat labs (CBC & BMP).)    Specialty:  Internal Medicine   Contact information:   [place] [address] [phone]       Schedule an appointment as soon as possible for a visit with Charles Carnes, MD.   Specialty:  Cardiology   Contact information:   [place] [address] [phone]        The results of significant diagnostics from this hospitalization (including imaging, microbiology, ancillary and laboratory) are listed below for reference.    Significant Diagnostic Studies: US Renal  07/30/2014   CLINICAL DATA:  Urinary retention in this setting of recent Foley trauma  EXAM: RENAL/URINARY TRACT ULTRASOUND COMPLETE  COMPARISON:  None.  FINDINGS: Right Kidney:  Length: 13.7 cm. Echogenicity within normal limits. No mass or hydronephrosis visualized.  Left Kidney:  Length: 13.0 cm. Visualization of the left kidney was somewhat difficult due to the presence of bowel gas. Echogenicity within normal limits. No mass or hydronephrosis visualized.  Bladder:  The urinary bladder is mildly distended with a volume of 778  cc. The patient was unable to void spontaneously. An attempt at Foley catheterization reportedly produced blood clots. The prostate gland is mildly enlarged measuring 5.2 x 5.1 x 5.2 cm. It produces an impression upon the urinary bladder base.  IMPRESSION: 1. There is distention of the urinary bladder with debris present likely reflecting blood products. The patient was unable to spontaneously void. There is prostatic enlargement. 2. The kidneys exhibit no acute abnormalities.   Electronically Signed   By: David  Martinique   On: 07/30/2014 16:04   Dg Chest Port 1 View  07/25/2014   CLINICAL DATA:  Respiratory distress  EXAM: PORTABLE CHEST - 1 VIEW  COMPARISON:  05/25/2014  FINDINGS: There are bilateral interstitial and alveolar airspace opacities most severe in the right lower lung. There is prominence of the central pulmonary vasculature. There is no pneumothorax. There is no definite pleural effusion. The heart and mediastinum are stable. There is thoracic aortic atherosclerosis.  The osseous structures are unremarkable.  IMPRESSION: Bilateral interstitial and alveolar airspace opacities, right greater than left, which may reflect pulmonary edema versus multilobar pneumonia.   Electronically Signed   By: Kathreen Devoid   On: 07/25/2014 12:35    Microbiology: Recent Results (from the past 240 hour(s))  URINE CULTURE     Status: None   Collection Time    07/30/14  6:27 PM      Result Value Ref Range Status   Specimen Description URINE, CATHETERIZED   Final   Special Requests NONE   Final  Culture  Setup Time     Final   Value: 07/30/2014 20:41     Performed at [facility] Count     Final   Value: NO GROWTH     Performed at [facility]   Culture     Final   Value: NO GROWTH     Performed at [facility]   Report Status 08/01/2014 FINAL   Final  CULTURE, BLOOD (ROUTINE X 2)     Status: None   Collection Time    07/31/14  3:10 AM      Result Value Ref Range  Status   Specimen Description BLOOD RIGHT ARM   Final   Special Requests BOTTLES DRAWN AEROBIC AND ANAEROBIC 5CC EACH   Final   Culture  Setup Time     Final   Value: 07/31/2014 08:52     Performed at [facility]   Culture     Final   Value: NO GROWTH 5 DAYS     Performed at [facility]   Report Status 08/06/2014 FINAL   Final  CULTURE, BLOOD (ROUTINE X 2)     Status: None   Collection Time    07/31/14  3:25 AM      Result Value Ref Range Status   Specimen Description BLOOD RIGHT HAND   Final   Special Requests BOTTLES DRAWN AEROBIC AND ANAEROBIC 5CC EACH   Final   Culture  Setup Time     Final   Value: 07/31/2014 08:52     Performed at [facility]   Culture     Final   Value: NO GROWTH 5 DAYS     Performed at [facility]   Report Status 08/06/2014 FINAL   Final     Labs: Basic Metabolic Panel:  Recent Labs Lab 07/31/14 0310 08/02/14 0605 08/03/14 0349 08/05/14 0447  NA 144 143 142 142  K 4.4 3.4* 3.6* 4.5  CL 101 101 100 102  CO2 34* 35* 34* 33*  GLUCOSE 122* 201* 164* 165*  BUN 17 15 15 12   CREATININE 0.92 0.72 0.79 0.80  CALCIUM 8.9 8.8 8.7 8.7   Liver Function Tests: No results found for this basename: AST, ALT, ALKPHOS, BILITOT, PROT, ALBUMIN,  in the last 168 hours No results found for this basename: LIPASE, AMYLASE,  in the last 168 hours No results found for this basename: AMMONIA,  in the last 168 hours CBC:  Recent Labs Lab 07/31/14 0310 08/02/14 0605  WBC 14.1* 8.0  HGB 11.3* 11.0*  HCT 34.7* 34.8*  MCV 95.6 97.8  PLT 305 300   Cardiac Enzymes: No results found for this basename: CKTOTAL, CKMB, CKMBINDEX, TROPONINI,  in the last 168 hours BNP: BNP (last 3 results)  Recent Labs  07/25/14 1209  PROBNP 1647.0*   CBG:  Recent Labs Lab 08/05/14 1133 08/05/14 1642 08/05/14 2135 08/06/14 0650 08/06/14 1104  GLUCAP 275* 204* 154* 201* 232*     Signed:  Vernell Leep, MD, FACP, FHM. [facility] Pager [phone]  If 7PM-7AM, please contact night-coverage [email or web address] Password [facility] 08/06/2014, 12:51 PM

## 2014-08-06 NOTE — Progress Notes (Signed)
Pt.had no c/o pain and no signs of distress. Pt.expressed concerns about his insulins and became irritable and uncooperative at times, MD was notified. His special urinary catheter was in place and irrigation port was plugged per MD order. Pt.was discharged with specialty catheter in place. He was transported by wheelchair to private vehicle accompanied by hospital volunteer.

## 2014-08-07 ENCOUNTER — Telehealth: Payer: Self-pay | Admitting: *Deleted

## 2014-08-07 NOTE — Telephone Encounter (Signed)
Reviewed the discharge summary with pt.    Admit date: 07/25/2014  Discharge date: 08/06/2014  Recommendations for Outpatient Follow-up:  1. Dr. Irine Seal, Urology in one to 2 weeks. 2. Dr. [name], PCP in 5 days with repeat labs (CBC and BMP. 3. Dr. Dorris Carnes, Cardiology 4. Home health PT & RN 5. Continue nightly CPAP as per prior home settings. 6. Patient will be discharged with indwelling Foley catheter. 7. Recommend followup chest x-ray in 4-6 weeks.  Current Condition:  Pt states "I am doing good and I don't need PT, I will be mowing the grass next week".  Pt states that he is not currently having any problems with foley catheter. History/Surgical History: No updates made  Referrals: Pt aware of Cardiology appt scheduled, Pt was unsure about Urology appt, Pt was unsure about home health care nurse visit and PT Medications/Allergies: Reviewed and updated  Verified and confirmed hospital f/u appt on Aug 31st at 8:45 with Dr Alain Marion.

## 2014-08-09 DIAGNOSIS — J111 Influenza due to unidentified influenza virus with other respiratory manifestations: Secondary | ICD-10-CM | POA: Diagnosis not present

## 2014-08-09 DIAGNOSIS — R339 Retention of urine, unspecified: Secondary | ICD-10-CM | POA: Diagnosis not present

## 2014-08-09 DIAGNOSIS — Z794 Long term (current) use of insulin: Secondary | ICD-10-CM | POA: Diagnosis not present

## 2014-08-09 DIAGNOSIS — R319 Hematuria, unspecified: Secondary | ICD-10-CM | POA: Diagnosis not present

## 2014-08-09 DIAGNOSIS — J441 Chronic obstructive pulmonary disease with (acute) exacerbation: Secondary | ICD-10-CM | POA: Diagnosis not present

## 2014-08-09 DIAGNOSIS — E119 Type 2 diabetes mellitus without complications: Secondary | ICD-10-CM | POA: Diagnosis not present

## 2014-08-09 DIAGNOSIS — Z87891 Personal history of nicotine dependence: Secondary | ICD-10-CM | POA: Diagnosis not present

## 2014-08-09 DIAGNOSIS — Z8701 Personal history of pneumonia (recurrent): Secondary | ICD-10-CM | POA: Diagnosis not present

## 2014-08-09 DIAGNOSIS — Z466 Encounter for fitting and adjustment of urinary device: Secondary | ICD-10-CM | POA: Diagnosis not present

## 2014-08-09 DIAGNOSIS — I4891 Unspecified atrial fibrillation: Secondary | ICD-10-CM | POA: Diagnosis not present

## 2014-08-12 ENCOUNTER — Encounter: Payer: Self-pay | Admitting: Internal Medicine

## 2014-08-12 ENCOUNTER — Ambulatory Visit (INDEPENDENT_AMBULATORY_CARE_PROVIDER_SITE_OTHER): Payer: Medicare Other | Admitting: Internal Medicine

## 2014-08-12 VITALS — BP 120/80 | HR 86 | Temp 97.5°F | Ht 76.0 in | Wt 344.1 lb

## 2014-08-12 DIAGNOSIS — R319 Hematuria, unspecified: Secondary | ICD-10-CM | POA: Insufficient documentation

## 2014-08-12 DIAGNOSIS — J441 Chronic obstructive pulmonary disease with (acute) exacerbation: Secondary | ICD-10-CM | POA: Diagnosis not present

## 2014-08-12 DIAGNOSIS — E1165 Type 2 diabetes mellitus with hyperglycemia: Secondary | ICD-10-CM

## 2014-08-12 DIAGNOSIS — E119 Type 2 diabetes mellitus without complications: Secondary | ICD-10-CM | POA: Diagnosis not present

## 2014-08-12 DIAGNOSIS — J111 Influenza due to unidentified influenza virus with other respiratory manifestations: Secondary | ICD-10-CM | POA: Diagnosis not present

## 2014-08-12 DIAGNOSIS — A419 Sepsis, unspecified organism: Secondary | ICD-10-CM

## 2014-08-12 DIAGNOSIS — I4891 Unspecified atrial fibrillation: Secondary | ICD-10-CM | POA: Diagnosis not present

## 2014-08-12 DIAGNOSIS — I10 Essential (primary) hypertension: Secondary | ICD-10-CM

## 2014-08-12 DIAGNOSIS — IMO0002 Reserved for concepts with insufficient information to code with codable children: Secondary | ICD-10-CM

## 2014-08-12 DIAGNOSIS — E876 Hypokalemia: Secondary | ICD-10-CM | POA: Insufficient documentation

## 2014-08-12 DIAGNOSIS — Z8673 Personal history of transient ischemic attack (TIA), and cerebral infarction without residual deficits: Secondary | ICD-10-CM

## 2014-08-12 DIAGNOSIS — Z8701 Personal history of pneumonia (recurrent): Secondary | ICD-10-CM | POA: Diagnosis not present

## 2014-08-12 DIAGNOSIS — I480 Paroxysmal atrial fibrillation: Secondary | ICD-10-CM

## 2014-08-12 DIAGNOSIS — E11319 Type 2 diabetes mellitus with unspecified diabetic retinopathy without macular edema: Secondary | ICD-10-CM

## 2014-08-12 DIAGNOSIS — R6521 Severe sepsis with septic shock: Secondary | ICD-10-CM

## 2014-08-12 NOTE — Assessment & Plan Note (Signed)
Labs Cont rx

## 2014-08-12 NOTE — Assessment & Plan Note (Signed)
On Rx Labs in 1 mo

## 2014-08-12 NOTE — Assessment & Plan Note (Signed)
8/15 - on Coumadin - dark blood in the catheter - Dr Jeffie Pollock -appt 08/20/14 pending

## 2014-08-12 NOTE — Assessment & Plan Note (Signed)
Continue with current prescription therapy as reflected on the Med list.  

## 2014-08-12 NOTE — Progress Notes (Signed)
Subjective:   HPI  F/u hospital stay for (d/c 8/25):   MYOCARDIAL INFARCTION, HX OF  CORONARY ARTERY DISEASE  History of CVA (cerebrovascular accident)  Type 2 diabetes, uncontrolled, with retinopathy  PAF (paroxysmal atrial fibrillation)  CAP (community acquired pneumonia)  Acute respiratory failure with hypoxia  Septic shock  Hypotension  COPD  Urinary retention  Discharge Condition: Improved & Stable  Diet recommendation: Heart healthy and diabetic diet.  Filed Weights    08/04/14 0553  08/05/14 0537  08/06/14 0637   Weight:  171.913 kg (379 lb)  163.3 kg (360 lb 0.2 oz)  163.1 kg (359 lb 9.1 oz)   History of present illness:  73 y.o. M Hx Paroxysmal atrial fibrillation, obstructive sleep apnea, Hx stroke, Hx CAD, DM Type 2 with neuropathy, and retinopathy, HTN, HLD.  Endorsed was in his usual state of health when he started developing minimal shortness of breath. He reported "cold like symptoms". Over the next 2-3 days his symptoms progressively worsened. He did not have any cough initially but started coughing 24 hours prior to presentation and developed fever up to 101.21F. On the morning of admisison he woke up to go to the bathroom and became very short of breath, even with minimal exertion. He normally uses a CPAP every night and despite the use of CPAP he did not feel any better. His wife gave him her oxygen and one of her inhalers without any relief. Since his symptoms were not improving they called his primary care physician, who recommended that they call 911. When EMS arrived at the house he was found to be in respiratory distress. He was found to be cold, clammy, diaphoretic. Saturations initially were in 72%. He was placed on a nonrebreather and subsequently, on BiPAP. Was given Nebulizer treatments, steroids, and, he was brought into the emergency department.  In the emergency department it was noted that his blood pressure had been dropping. He was also tachycardic.  Patient denied any dizziness, or any chest pain, per se. He was experiencing pleuritic type chest discomfort with coughing.  Hospital Course:  Hematuria/severe urinary urgency/urinary retention secondary to blood clots  -suspect foley trauma due to accidental slippage of catheter into urethra after placement  -Initially Coude' catheter placed under cystoscopy 8/18. Patient over the last couple days prior to discharge developed significant worsening hematuria, urinary retention, painful voiding. Urology continue to closely follow patient. His catheter was changed. He was placed on continuous bladder irrigation. All antiplatelets and anticoagulants have been discontinued. Improved  - Urology has cleared patient for discharge with Foley catheter until followup in office in one to 2 weeks.  -post catheter UA abnormal but fu cx negative  Sepsis due to CAP and H Flu Bacteremia / ESBL negative  -Patient on antibiotics since 8/13. Received single dose of azithromycin, Rocephin and levofloxacin on 8/13. Completed 3 days of vancomycin 8/13-8/15. Has been on IV Zosyn since 8/14 (6 days).  -peak lactic acid 4.7 but has normalized  -PCT peaked at 2.76  -ID consulted and no indication for TEE for bacteremia  -blood cx's repeated 8/19-negative to date  - discussed with infectious disease M.D. on call on 8/20 and switched antibiotics to oral Augmentin and completed additional 5 days as per ID recommendation.  Acute respiratory failure with hypoxia due to Haemophilus influenza CAP and acute COPD exac  -Patient completed course of antibiotics and steroids. He was treated in usual fashion with bronchodilator nebulizations, oxygen and flutter valve. Acute respiratory failure resolved.  HYPERTENSION with concentric hypertrophy  - Controlled on metoprolol.  Atrial arrhythmias-PAF (paroxysmal atrial fibrillation)/SVT/A. flutter  -Cardiology was consulted. Patient appeared to go in and out of A. fib with RVR with  possible additional SVT versus atrial flutter this admission. Rate controlled on metoprolol-continue previous home dose. Patient was on Plavix and aspirin prior to admission for atrial fibrillation and CAD. Initially Plavix was discontinued. After discussing with patient, he was started on Lovenox bridging and Coumadin. In the interim his hematuria got worse. Urology recommended discontinuing all anticoagulants and antiplatelet. Patient finally stated that he is absolutely not interested in taking warfarin or novel anticoagulants for stroke prevention in the setting of PAF. He understands the risks and states "I just want to enjoy what time I have left". Would be reasonable to resume Plavix when cleared by urology.  -TSH/Free T4 normal  MYOCARDIAL INFARCTION, HX OF/CORONARY ARTERY DISEASE  -per Cards  -Echocardiogram shows a mild left atrial dilation and mild left ventricle hypertrophy.  Type 2 diabetes, uncontrolled, with retinopathy and neuropathy  -Patient was on oral hypoglycemics and customized sliding scale regimen at home. Sliding scale insulin was continued in the hospital and low-dose Lantus was added. At discharge, patient will be discharged on his oral hypoglycemics except Actos (held secondary to fluid retention) and SSI. This can be further titrated outpatient as deemed necessary.  History of CVA (cerebrovascular accident)  HLD  -Continue Zocor 40 mg daily  -lipid panel this admit within ADA guidelines  Anasarca  - Likely secondary to volume resuscitation early on admission and hypoalbuminemia. Lasix was held secondary to acute urinary issues-resumed 8/23. Improving  Consultations:  Cardiology  Infectious disease  Urology Procedures:  Foley catheter   C/o dark blood in the catheter - Dr Jeffie Pollock -appt 08/20/14 pending   F/u rash on arms and torso - gone off Xarelto  Charles [name] wants to stop Xarelto due to cost and lawyers adds on TV and to go back on Plavix...  BP Readings from  Last 3 Encounters:  08/12/14 120/80  08/06/14 124/46  07/02/14 120/64   Wt Readings from Last 3 Encounters:  08/12/14 344 lb 1.9 oz (156.092 kg)  08/06/14 359 lb 9.1 oz (163.1 kg)  07/02/14 358 lb 6.4 oz (162.569 kg)     Review of Systems  Constitutional: Negative for appetite change, fatigue and unexpected weight change.  HENT: Negative for congestion, nosebleeds, sneezing, sore throat and trouble swallowing.   Eyes: Negative for itching and visual disturbance.  Respiratory: Negative for cough.   Cardiovascular: Negative for chest pain, palpitations and leg swelling.  Gastrointestinal: Negative for nausea, diarrhea, blood in stool and abdominal distention.  Genitourinary: Negative for frequency and hematuria.  Musculoskeletal: Negative for back pain, gait problem, joint swelling and neck pain.  Skin: Negative for rash.  Neurological: Negative for dizziness, tremors, speech difficulty and weakness.  Psychiatric/Behavioral: Negative for sleep disturbance, dysphoric mood and agitation. The patient is not nervous/anxious.        Objective:   Physical Exam  Constitutional: He is oriented to person, place, and time. He appears well-developed.  obese  HENT:  Mouth/Throat: Oropharynx is clear and moist.  Hard hearing  Eyes: Conjunctivae are normal. Pupils are equal, round, and reactive to light.  Neck: Normal range of motion. No JVD present. No thyromegaly present.  Cardiovascular: Normal rate, regular rhythm, normal heart sounds and intact distal pulses.  Exam reveals no gallop and no friction rub.   No murmur heard. Pulmonary/Chest: Effort normal and breath  sounds normal. No respiratory distress. He has no wheezes. He has no rales. He exhibits no tenderness.  Abdominal: Soft. Bowel sounds are normal. He exhibits no distension and no mass. There is no tenderness. There is no rebound and no guarding.  Musculoskeletal: Normal range of motion. He exhibits edema (trace  B ankles). He  exhibits no tenderness.  Lymphadenopathy:    He has no cervical adenopathy.  Neurological: He is alert and oriented to person, place, and time. He has normal reflexes. No cranial nerve deficit. He exhibits normal muscle tone. Coordination normal.  Skin: Skin is warm and dry. No rash noted. No erythema.  No rash  Psychiatric: He has a normal mood and affect. His behavior is normal. Judgment and thought content normal.    Lab Results  Component Value Date   WBC 8.0 08/02/2014   HGB 11.0* 08/02/2014   HCT 34.8* 08/02/2014   PLT 300 08/02/2014   GLUCOSE 165* 08/05/2014   CHOL 121 07/28/2014   TRIG 48 07/28/2014   HDL 42 07/28/2014   LDLCALC 69 07/28/2014   ALT 13 07/29/2014   AST 17 07/29/2014   NA 142 08/05/2014   K 4.5 08/05/2014   CL 102 08/05/2014   CREATININE 0.80 08/05/2014   BUN 12 08/05/2014   CO2 33* 08/05/2014   TSH 1.680 07/29/2014   PSA 0.96 08/28/2010   INR 1.35 08/03/2014   HGBA1C 7.3* 07/26/2014   MICROALBUR 1.4 08/28/2010         Assessment & Plan:

## 2014-08-12 NOTE — Assessment & Plan Note (Signed)
8/15 resolved

## 2014-08-12 NOTE — Assessment & Plan Note (Signed)
Pt agreed to take Plavix when his urinary bleeding is resolved

## 2014-08-12 NOTE — Progress Notes (Signed)
Pre visit review using our clinic review tool, if applicable. No additional management support is needed unless otherwise documented below in the visit note. 

## 2014-08-13 DIAGNOSIS — Z8701 Personal history of pneumonia (recurrent): Secondary | ICD-10-CM | POA: Diagnosis not present

## 2014-08-13 DIAGNOSIS — R319 Hematuria, unspecified: Secondary | ICD-10-CM | POA: Diagnosis not present

## 2014-08-13 DIAGNOSIS — E119 Type 2 diabetes mellitus without complications: Secondary | ICD-10-CM | POA: Diagnosis not present

## 2014-08-13 DIAGNOSIS — J111 Influenza due to unidentified influenza virus with other respiratory manifestations: Secondary | ICD-10-CM | POA: Diagnosis not present

## 2014-08-13 DIAGNOSIS — J441 Chronic obstructive pulmonary disease with (acute) exacerbation: Secondary | ICD-10-CM | POA: Diagnosis not present

## 2014-08-13 DIAGNOSIS — I4891 Unspecified atrial fibrillation: Secondary | ICD-10-CM | POA: Diagnosis not present

## 2014-08-15 DIAGNOSIS — J111 Influenza due to unidentified influenza virus with other respiratory manifestations: Secondary | ICD-10-CM | POA: Diagnosis not present

## 2014-08-15 DIAGNOSIS — E119 Type 2 diabetes mellitus without complications: Secondary | ICD-10-CM | POA: Diagnosis not present

## 2014-08-15 DIAGNOSIS — I4891 Unspecified atrial fibrillation: Secondary | ICD-10-CM | POA: Diagnosis not present

## 2014-08-15 DIAGNOSIS — J441 Chronic obstructive pulmonary disease with (acute) exacerbation: Secondary | ICD-10-CM | POA: Diagnosis not present

## 2014-08-15 DIAGNOSIS — Z8701 Personal history of pneumonia (recurrent): Secondary | ICD-10-CM | POA: Diagnosis not present

## 2014-08-15 DIAGNOSIS — R319 Hematuria, unspecified: Secondary | ICD-10-CM | POA: Diagnosis not present

## 2014-08-16 DIAGNOSIS — J111 Influenza due to unidentified influenza virus with other respiratory manifestations: Secondary | ICD-10-CM | POA: Diagnosis not present

## 2014-08-16 DIAGNOSIS — J441 Chronic obstructive pulmonary disease with (acute) exacerbation: Secondary | ICD-10-CM | POA: Diagnosis not present

## 2014-08-16 DIAGNOSIS — Z8701 Personal history of pneumonia (recurrent): Secondary | ICD-10-CM | POA: Diagnosis not present

## 2014-08-16 DIAGNOSIS — E119 Type 2 diabetes mellitus without complications: Secondary | ICD-10-CM | POA: Diagnosis not present

## 2014-08-16 DIAGNOSIS — R319 Hematuria, unspecified: Secondary | ICD-10-CM | POA: Diagnosis not present

## 2014-08-16 DIAGNOSIS — I4891 Unspecified atrial fibrillation: Secondary | ICD-10-CM | POA: Diagnosis not present

## 2014-08-20 ENCOUNTER — Ambulatory Visit: Payer: Medicare Other | Admitting: Internal Medicine

## 2014-08-20 DIAGNOSIS — R339 Retention of urine, unspecified: Secondary | ICD-10-CM | POA: Diagnosis not present

## 2014-08-20 DIAGNOSIS — J111 Influenza due to unidentified influenza virus with other respiratory manifestations: Secondary | ICD-10-CM | POA: Diagnosis not present

## 2014-08-20 DIAGNOSIS — I4891 Unspecified atrial fibrillation: Secondary | ICD-10-CM | POA: Diagnosis not present

## 2014-08-20 DIAGNOSIS — J441 Chronic obstructive pulmonary disease with (acute) exacerbation: Secondary | ICD-10-CM | POA: Diagnosis not present

## 2014-08-20 DIAGNOSIS — Z8701 Personal history of pneumonia (recurrent): Secondary | ICD-10-CM | POA: Diagnosis not present

## 2014-08-26 ENCOUNTER — Encounter: Payer: Self-pay | Admitting: Internal Medicine

## 2014-08-27 ENCOUNTER — Ambulatory Visit (INDEPENDENT_AMBULATORY_CARE_PROVIDER_SITE_OTHER): Payer: Medicare Other

## 2014-08-27 ENCOUNTER — Telehealth: Payer: Self-pay | Admitting: *Deleted

## 2014-08-27 ENCOUNTER — Other Ambulatory Visit: Payer: Self-pay | Admitting: Internal Medicine

## 2014-08-27 VITALS — BP 164/80 | HR 89 | Temp 97.3°F | Resp 16 | Ht 76.0 in | Wt 343.0 lb

## 2014-08-27 DIAGNOSIS — E1149 Type 2 diabetes mellitus with other diabetic neurological complication: Secondary | ICD-10-CM

## 2014-08-27 DIAGNOSIS — L97509 Non-pressure chronic ulcer of other part of unspecified foot with unspecified severity: Secondary | ICD-10-CM

## 2014-08-27 DIAGNOSIS — L02619 Cutaneous abscess of unspecified foot: Secondary | ICD-10-CM | POA: Diagnosis not present

## 2014-08-27 DIAGNOSIS — L03119 Cellulitis of unspecified part of limb: Secondary | ICD-10-CM

## 2014-08-27 DIAGNOSIS — E1140 Type 2 diabetes mellitus with diabetic neuropathy, unspecified: Secondary | ICD-10-CM

## 2014-08-27 DIAGNOSIS — L608 Other nail disorders: Secondary | ICD-10-CM | POA: Diagnosis not present

## 2014-08-27 MED ORDER — CLOPIDOGREL BISULFATE 75 MG PO TABS: 75.0000 mg | ORAL_TABLET | Freq: Every day | ORAL | Status: DC

## 2014-08-27 MED ORDER — SILVER SULFADIAZINE 1 % EX CREA: 1.0000 "application " | TOPICAL_CREAM | Freq: Every day | CUTANEOUS | Status: DC

## 2014-08-27 MED ORDER — DOXYCYCLINE HYCLATE 100 MG PO TABS: 100.0000 mg | ORAL_TABLET | Freq: Two times a day (BID) | ORAL | Status: DC

## 2014-08-27 NOTE — Telephone Encounter (Signed)
He was supposed to have 2 prescriptions sent to [facility] in [place].  One was the Doxycycline and the other Silvadene, they said they have not gotten it.  Please give me a call.  Thank you.  I called and informed her that the medication was e-scribed to [facility] by Marcy Siren at  3:08pm and 3:09pm.  I asked when she called to see if it was ready.  She stated she called right when she got home after leaving here.  I asked her to call again and let us know if she has any further problems.  She stated she would, thanks for calling.

## 2014-08-27 NOTE — Progress Notes (Signed)
Subjective:    Patient ID: Charles [name], male    DOB: [birth year], 73 y.o.   MRN: [id number]  HPI Comments: Pt presents for debridement of 8 toenails and has a red right dorsal foot and 1st toe with ulcer to dorsal toe.  Pt's right foot has been red 2 -3 days, without known injury.  Pt presents for diabetic shoe measurement.      Review of Systems  Constitutional: Positive for unexpected weight change.  Respiratory: Positive for chest tightness.        Hospitalized for pneumonia 07/2014, later found septic with Haemophilus Influenza.   Genitourinary:       Given Lasix and catheterized for 1 week due to problem increased fluid while hospitalized, and later problems with the urinary catheterization procedure.  Neurological: Positive for weakness.  All other systems reviewed and are negative.      Objective:   Physical Exam 73 year old white male presents at this time. He well-developed well-nourished and oriented however does have some new issues from has today's visit redness swelling and fever directions to the dorsum of the right foot has ulceration of the hallux IP joint dorsally with extending cellulitis to the or the ankle on the right. Painful tender symptomatic been going on for at least 2 days. Patient also presents this time for diabetic foot and nail care and shoe measurements.  Lower extremity objective findings as follows vascular status appears to be intact DP thready at one over 4 bilateral PT nonpalpable bilateral capillary refill time 5 seconds epicritic and proprioceptive sensations grossly diminished on Semmes Weinstein to forefoot digits arch and heel area. There is hyperesthesia the right foot due to the cellulitis. There is normal plantar response DTRs not elicited dermatologically skin color pigment normal hair growth absent there is erythema the right dorsal foot from the hallux extending entire dorsal aspect of the foot near the anterior ankle. There is ulceration  proximal the half centimeter to a just under a centimeter in diameter of the hallux IP joint dorsally on the right there is mild serous drainage no purulence no ascending psoas lymphangitis does not probe or any acute down to bone or capsule appears to be relatively superficial subcutaneous dermal to subcutaneous ulcer. 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 Should note patient was recently hospitalized with Haemophilus influenza and pneumonia have some fluid issues as well currently is back abdomen more stable except for the and now new foot infection on the right foot.       Assessment & Plan:  Assessment this time is ulcer right hallux with associated cellulitis of the right foot #2 is keratosis incurvation friability of nails 1 through 5 right 2 through 5 left the presence of diabetes and complications and peripheral neuropathy and angiopathy and vascular compromise. History of previous amputation left great toe and other complications noted patient does have severe with the dry skin with fissuring and chronic tinea pedis an inch issues as well. At this time the ulcer is cleansed with all cleansed Silvadene and gauze dressing applied. Nails thick brittle crumbly dystrophic or debridement x9 the presence of diabetes and complications and dystrophy. Patient also is given measurements for diabetic extra-depth shoes and 3 dual density inlays patient is a candidate for replacement of shoes inlays his previous shoes are worn  we'll consider a softer type shoe such as a apex 1200 type shoe we will beneficial due to severe contractures and foot deformities. Recheck in one week for ulcer and cellulitis followup likely 3 months for palliative care in within the next month orthotic and shoe pick up and fitting  however for fever or chills were to develop or if there is any cellulitis extending behind the marked area today patient is to go daily to the emergency room for likely IV antibiotic therapy, in the hospital was on vancomycin and another antibiotic likely [place] DPM

## 2014-08-27 NOTE — Patient Instructions (Signed)
ANTIBACTERIAL SOAP INSTRUCTIONS  THE DAY AFTER PROCEDURE  Please follow the instructions your doctor has marked.   Shower as usual. Before getting out, place a drop of antibacterial liquid soap (Dial) on a wet, clean washcloth.  Gently wipe washcloth over affected area.  Afterward, rinse the area with warm water.  Blot the area dry with a soft cloth and cover with antibiotic ointment (neosporin, polysporin, bacitracin) and band aid or gauze and tape  Place 3-4 drops of antibacterial liquid soap in a quart of warm tap water.  Submerge foot into water for 20 minutes.  If bandage was applied after your procedure, leave on to allow for easy lift off, then remove and continue with soak for the remaining time.  Next, blot area dry with a soft cloth and cover with a bandage.  Apply other medications as directed by your doctor, such as cortisporin otic solution (eardrops) or neosporin antibiotic ointment  Wash the foot daily with antibacterial soap and warm water rinse and dry thoroughly. Apply Silvadene and gauze dressing to right great toe ulcer. Repeat dressing changes daily until resolved

## 2014-08-27 NOTE — Addendum Note (Signed)
Addended by: Harriett Sine D on: 08/27/2014 03:09 PM   Modules accepted: Orders

## 2014-08-29 DIAGNOSIS — N138 Other obstructive and reflux uropathy: Secondary | ICD-10-CM | POA: Diagnosis not present

## 2014-08-29 DIAGNOSIS — N401 Enlarged prostate with lower urinary tract symptoms: Secondary | ICD-10-CM | POA: Diagnosis not present

## 2014-08-29 DIAGNOSIS — S3730XA Unspecified injury of urethra, initial encounter: Secondary | ICD-10-CM | POA: Diagnosis not present

## 2014-08-29 DIAGNOSIS — S3720XA Unspecified injury of bladder, initial encounter: Secondary | ICD-10-CM | POA: Diagnosis not present

## 2014-08-29 DIAGNOSIS — R82998 Other abnormal findings in urine: Secondary | ICD-10-CM | POA: Diagnosis not present

## 2014-08-29 DIAGNOSIS — N139 Obstructive and reflux uropathy, unspecified: Secondary | ICD-10-CM | POA: Diagnosis not present

## 2014-08-29 NOTE — Progress Notes (Signed)
HPI Patient is a 73 yo with a history of CAD (MI in 2000), HTN, CVA, HLand atrial fib.  Last cath done showed moderate CAD. Plan for medical Rx. He has been on coumadin and Xarelto in past I saw the patient in May 2014 He was hospitalized in [place] with urosepsis.  In and out of afib  Placed on coumadin  THis was stopped prior to d/c  Had discussed with cardiol risks of CVA   Allergies  Allergen Reactions  . Xarelto [Rivaroxaban]     rash    Current Outpatient Prescriptions  Medication Sig Dispense Refill  . acetaminophen (TYLENOL) 325 MG tablet Take 2 tablets (650 mg total) by mouth every 6 (six) hours as needed for mild pain, fever or headache (or Fever >/= 101).      . Cholecalciferol (EQL VITAMIN D3) 1000 UNITS tablet Take 1,000 Units by mouth daily.        . clopidogrel (PLAVIX) 75 MG tablet Take 1 tablet (75 mg total) by mouth daily.  90 tablet  3  . doxycycline (VIBRA-TABS) 100 MG tablet Take 1 tablet (100 mg total) by mouth 2 (two) times daily.  20 tablet  1  . furosemide (LASIX) 80 MG tablet Take 0.5 tablets (40 mg total) by mouth daily.  30 tablet  0  . gabapentin (NEURONTIN) 300 MG capsule Take 300 mg by mouth 2 (two) times daily.      Marland Kitchen glimepiride (AMARYL) 4 MG tablet Take 1 tablet (4 mg total) by mouth 2 (two) times daily.  180 tablet  3  . insulin lispro (HUMALOG KWIKPEN) 100 UNIT/ML KiwkPen Inject 5-20 Units into the skin 4 (four) times daily - after meals and at bedtime. Per sliding scale      . losartan (COZAAR) 100 MG tablet Take 1 tablet (100 mg total) by mouth daily.  90 tablet  3  . metoprolol (LOPRESSOR) 50 MG tablet Take 1 tablet (50 mg total) by mouth 2 (two) times daily.  180 tablet  3  . nitroGLYCERIN (NITROSTAT) 0.4 MG SL tablet Place 0.4 mg under the tongue every 5 (five) minutes as needed. For chest pain      . potassium chloride SA (K-DUR,KLOR-CON) 20 MEQ tablet Take 2 tablets (40 mEq total) by mouth daily.  30 tablet  0  . pravastatin (PRAVACHOL) 40 MG tablet Take  1 tablet (40 mg total) by mouth daily.  90 tablet  3  . silver sulfADIAZINE (SILVADENE) 1 % cream Apply 1 application topically daily.  50 g  2  . vitamin B-12 (CYANOCOBALAMIN) 1000 MCG tablet Take 1,000 mcg by mouth daily.         No current facility-administered medications for this visit.    Past Medical History  Diagnosis Date  . CAD (coronary artery disease)     a. Approx. 2000 - MI. Cath showed single vessel disease, PTCA dLAD/medical management. ;  b. NSTEMI 11/13 => LHC: prox and mid LAD 30%, dLAD 60%, pCFX 30%, inf branch of OM 40%, mRCA 50-60%, EF 55-60%; IVUS attempted for RCA but not successful; anatomy felt stable from 2000 => med Rx.  . Diverticulitis   . Hypertension   . Morbid obesity   . CVA (cerebral infarction)     a. Approx. 2002  . Cholelithiasis   . Hypercholesterolemia   . ED (erectile dysfunction)   . OSA on CPAP   . Type II diabetes mellitus   . History of stomach ulcers   . History  of MRSA infection     "little toe right foot" (10/26/2012)  . Tunnel vision     "both eyes since stroke"  . Claustrophobia   . B12 deficiency   . Diabetic neuropathy   . PAF (paroxysmal atrial fibrillation)     a. confirmed by event monitor. b. 02/2014 rash on Coumadin, patient decided to discontinue Xarelto due to possible rash, cost and lawyers ads on TV, agreed to take Plavix.    Past Surgical History  Procedure Laterality Date  . Toe amputation  2006; 2009    "Dr. Blenda Mounts; big toe left foot; little toe on right foot" (10/26/2012)  . Cardiac catheterization  1990's  . Cerebral angiogram  ~ 2000    Family History  Problem Relation Age of Onset  . Asthma Neg Hx   . Cancer Mother 91    breast  . Heart disease Father 46    heart    History   Social History  . Marital Status: Married    Spouse Name: N/A    Number of Children: N/A  . Years of Education: N/A   Occupational History  . Not on file.   Social History Main Topics  . Smoking status: Former Smoker  -- 1.00 packs/day for 30 years    Types: Cigarettes, Cigars  . Smokeless tobacco: Current User    Types: Snuff     Comment: 10/26/2012 "quit smoking cigarettes  in ~ 1996; wife smokes in the house"  . Alcohol Use: Yes     Comment: 10/26/2012 "have a drink maybe twice/yr"  . Drug Use: No  . Sexual Activity: Yes   Other Topics Concern  . Not on file   Social History Narrative   Disabled s/p CVA    Review of Systems:  All systems reviewed.  They are negative to the above problem except as previously stated.  Vital Signs: BP 126/82  Pulse 61  Ht 6\' 4"  (1.93 m)  Wt 350 lb (158.759 kg)  BMI 42.62 kg/m2  Physical Exam Pt in NAD HEENT:  Normocephalic, atraumatic. EOMI, PERRLA.  Neck: JVP is normal.  No bruits.  Lungs: clear to auscultation. No rales no wheezes.  Heart: Regular rate and rhythm. Normal S1, S2. No S3.   No significant murmurs. PMI not displaced.  Abdomen:  Supple, nontender. Normal bowel sounds. No masses. No hepatomegaly.  Extremities:   Good distal pulses throughout. No lower extremity edema.  Musculoskeletal :moving all extremities.  Neuro:   alert and oriented x3.  CN II-XII grossly intact.  EKG  SR  61 bpm  Poss lateral MI    Assessment and Plan:  1.  Atrial fibrillaiton.  Currently in SR. Currently on ASA  And plavix  Explained risk of stroke on this.  Patient has reviewed.  Continue  2.  CAD  Asymptomatic  3.  HTN  Adeaquate control  4.  HL  Not on statin.  Lipids were good in Aug  LDL was 46  Wiil need to repeat.

## 2014-08-30 ENCOUNTER — Ambulatory Visit (INDEPENDENT_AMBULATORY_CARE_PROVIDER_SITE_OTHER): Payer: Medicare Other | Admitting: Internal Medicine

## 2014-08-30 ENCOUNTER — Encounter: Payer: Self-pay | Admitting: Internal Medicine

## 2014-08-30 VITALS — BP 126/82 | HR 61 | Ht 76.0 in | Wt 350.0 lb

## 2014-08-30 DIAGNOSIS — R609 Edema, unspecified: Secondary | ICD-10-CM | POA: Diagnosis not present

## 2014-08-30 DIAGNOSIS — I2510 Atherosclerotic heart disease of native coronary artery without angina pectoris: Secondary | ICD-10-CM | POA: Diagnosis not present

## 2014-08-30 DIAGNOSIS — R0602 Shortness of breath: Secondary | ICD-10-CM

## 2014-08-30 DIAGNOSIS — E876 Hypokalemia: Secondary | ICD-10-CM | POA: Diagnosis not present

## 2014-08-30 NOTE — Patient Instructions (Signed)
Your physician recommends that you continue on your current medications as directed. Please refer to the Current Medication list given to you today. Your physician recommends that you HAVE LABS DRAWN AT YOUR OCT 5 APPT WITH YOUR PCP. Your physician wants you to follow-up in: February [place].   You will receive a reminder letter in the mail two months in advance. If you don't receive a letter, please call our office to schedule the follow-up appointment.

## 2014-09-03 ENCOUNTER — Ambulatory Visit (INDEPENDENT_AMBULATORY_CARE_PROVIDER_SITE_OTHER): Payer: Medicare Other

## 2014-09-03 VITALS — BP 168/79 | HR 75 | Temp 97.8°F | Resp 12

## 2014-09-03 DIAGNOSIS — L97509 Non-pressure chronic ulcer of other part of unspecified foot with unspecified severity: Secondary | ICD-10-CM

## 2014-09-03 DIAGNOSIS — E1140 Type 2 diabetes mellitus with diabetic neuropathy, unspecified: Secondary | ICD-10-CM

## 2014-09-03 DIAGNOSIS — Z79899 Other long term (current) drug therapy: Secondary | ICD-10-CM

## 2014-09-03 NOTE — Patient Instructions (Signed)
ANTIBACTERIAL SOAP INSTRUCTIONS  THE DAY AFTER PROCEDURE  Please follow the instructions your doctor has marked.   Shower as usual. Before getting out, place a drop of antibacterial liquid soap (Dial) on a wet, clean washcloth.  Gently wipe washcloth over affected area.  Afterward, rinse the area with warm water.  Blot the area dry with a soft cloth and cover with antibiotic ointment (neosporin, polysporin, bacitracin) and band aid or gauze and tape  Place 3-4 drops of antibacterial liquid soap in a quart of warm tap water.  Submerge foot into water for 20 minutes.  If bandage was applied after your procedure, leave on to allow for easy lift off, then remove and continue with soak for the remaining time.  Next, blot area dry with a soft cloth and cover with a bandage.  Apply other medications as directed by your doctor, such as cortisporin otic solution (eardrops) or neosporin antibiotic ointment  Cleanse the area daily dry thoroughly apply antibiotic ointment and gauze dressing your Silvadene or Neosporin gauze dressing as instructed

## 2014-09-03 NOTE — Progress Notes (Signed)
   Subjective:    Patient ID: Charles [name], male    DOB: 13-Sep-1941, 73 y.o.   MRN: [id number]  HPI ''RT FOOT GREAT TOE IS LOOKING A LITTLE BETTER.''   Review of Systems no new findings or systemic changes noted the redness and swelling is nearly disappeared and there extended past marked line. Still ulceration over the dorsum of the right hallux no     Objective:   Physical Exam Neurovascular status intact and unchanged pedal pulses DP thready at plus one over 4 PT nonpalpable skin temperature warm to cool there is no edema or erythema with dorsal foot no ascending cellulitis or lymphangitis noted has resolved considerably although slight erythema around the ulcer still present there is an ulcer approximately 5 x 7 mm in diameter with the hallux IP joint dorsally. Pink granular base with surrounding hemorrhagic keratoses. 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 Contact us immediately if there's any changes or exacerbations of any kind any increase or signs of infection are to be addressed immediately suggest to the emergency room if needed for appropriate IV antibiotics. Otherwise followup in 4 weeks  Harriet Masson DPM

## 2014-09-04 DIAGNOSIS — A419 Sepsis, unspecified organism: Secondary | ICD-10-CM | POA: Diagnosis not present

## 2014-09-04 DIAGNOSIS — R6521 Severe sepsis with septic shock: Secondary | ICD-10-CM

## 2014-09-04 DIAGNOSIS — R6520 Severe sepsis without septic shock: Secondary | ICD-10-CM

## 2014-09-04 DIAGNOSIS — E11319 Type 2 diabetes mellitus with unspecified diabetic retinopathy without macular edema: Secondary | ICD-10-CM

## 2014-09-04 DIAGNOSIS — E1139 Type 2 diabetes mellitus with other diabetic ophthalmic complication: Secondary | ICD-10-CM

## 2014-09-04 DIAGNOSIS — R319 Hematuria, unspecified: Secondary | ICD-10-CM | POA: Diagnosis not present

## 2014-09-04 DIAGNOSIS — I4891 Unspecified atrial fibrillation: Secondary | ICD-10-CM

## 2014-09-04 DIAGNOSIS — Z8673 Personal history of transient ischemic attack (TIA), and cerebral infarction without residual deficits: Secondary | ICD-10-CM

## 2014-09-04 DIAGNOSIS — I10 Essential (primary) hypertension: Secondary | ICD-10-CM | POA: Diagnosis not present

## 2014-09-04 DIAGNOSIS — E876 Hypokalemia: Secondary | ICD-10-CM

## 2014-09-04 DIAGNOSIS — E1165 Type 2 diabetes mellitus with hyperglycemia: Secondary | ICD-10-CM

## 2014-09-12 ENCOUNTER — Other Ambulatory Visit (INDEPENDENT_AMBULATORY_CARE_PROVIDER_SITE_OTHER): Payer: Medicare Other

## 2014-09-12 DIAGNOSIS — E876 Hypokalemia: Secondary | ICD-10-CM

## 2014-09-12 DIAGNOSIS — E11319 Type 2 diabetes mellitus with unspecified diabetic retinopathy without macular edema: Secondary | ICD-10-CM | POA: Diagnosis not present

## 2014-09-12 DIAGNOSIS — R609 Edema, unspecified: Secondary | ICD-10-CM

## 2014-09-12 DIAGNOSIS — I10 Essential (primary) hypertension: Secondary | ICD-10-CM | POA: Diagnosis not present

## 2014-09-12 DIAGNOSIS — I480 Paroxysmal atrial fibrillation: Secondary | ICD-10-CM | POA: Diagnosis not present

## 2014-09-12 DIAGNOSIS — Z8673 Personal history of transient ischemic attack (TIA), and cerebral infarction without residual deficits: Secondary | ICD-10-CM | POA: Diagnosis not present

## 2014-09-12 DIAGNOSIS — E119 Type 2 diabetes mellitus without complications: Secondary | ICD-10-CM | POA: Diagnosis not present

## 2014-09-12 DIAGNOSIS — I2510 Atherosclerotic heart disease of native coronary artery without angina pectoris: Secondary | ICD-10-CM

## 2014-09-12 DIAGNOSIS — R0602 Shortness of breath: Secondary | ICD-10-CM | POA: Diagnosis not present

## 2014-09-12 DIAGNOSIS — E1165 Type 2 diabetes mellitus with hyperglycemia: Secondary | ICD-10-CM

## 2014-09-12 LAB — HEPATIC FUNCTION PANEL
ALT: 13 U/L (ref 0–53)
AST: 14 U/L (ref 0–37)
Albumin: 3.1 g/dL — ABNORMAL LOW (ref 3.5–5.2)
Alkaline Phosphatase: 55 U/L (ref 39–117)
BILIRUBIN TOTAL: 0.5 mg/dL (ref 0.2–1.2)
Bilirubin, Direct: 0.1 mg/dL (ref 0.0–0.3)
Total Protein: 6.4 g/dL (ref 6.0–8.3)

## 2014-09-12 LAB — BRAIN NATRIURETIC PEPTIDE: Pro B Natriuretic peptide (BNP): 132 pg/mL — ABNORMAL HIGH (ref 0.0–100.0)

## 2014-09-12 LAB — CBC WITH DIFFERENTIAL/PLATELET
BASOS ABS: 0 10*3/uL (ref 0.0–0.1)
Basophils Relative: 0.2 % (ref 0.0–3.0)
Eosinophils Absolute: 0.3 10*3/uL (ref 0.0–0.7)
Eosinophils Relative: 5.2 % — ABNORMAL HIGH (ref 0.0–5.0)
HCT: 33.8 % — ABNORMAL LOW (ref 39.0–52.0)
Hemoglobin: 11.2 g/dL — ABNORMAL LOW (ref 13.0–17.0)
Lymphocytes Relative: 12.5 % (ref 12.0–46.0)
Lymphs Abs: 0.8 10*3/uL (ref 0.7–4.0)
MCHC: 33.1 g/dL (ref 30.0–36.0)
MCV: 91.4 fl (ref 78.0–100.0)
MONO ABS: 0.5 10*3/uL (ref 0.1–1.0)
Monocytes Relative: 8.1 % (ref 3.0–12.0)
Neutro Abs: 4.9 10*3/uL (ref 1.4–7.7)
Neutrophils Relative %: 74 % (ref 43.0–77.0)
PLATELETS: 276 10*3/uL (ref 150.0–400.0)
RBC: 3.7 Mil/uL — ABNORMAL LOW (ref 4.22–5.81)
RDW: 16 % — AB (ref 11.5–15.5)
WBC: 6.6 10*3/uL (ref 4.0–10.5)

## 2014-09-12 LAB — BASIC METABOLIC PANEL
BUN: 12 mg/dL (ref 6–23)
BUN: 12 mg/dL (ref 6–23)
CALCIUM: 8.7 mg/dL (ref 8.4–10.5)
CHLORIDE: 104 meq/L (ref 96–112)
CO2: 30 meq/L (ref 19–32)
CO2: 32 meq/L (ref 19–32)
CREATININE: 0.8 mg/dL (ref 0.4–1.5)
Calcium: 8.9 mg/dL (ref 8.4–10.5)
Chloride: 104 mEq/L (ref 96–112)
Creatinine, Ser: 0.8 mg/dL (ref 0.4–1.5)
GFR: 100.7 mL/min (ref >60.00)
GFR: 97.87 mL/min (ref >60.00)
GLUCOSE: 119 mg/dL — AB (ref 70–99)
Glucose, Bld: 119 mg/dL — ABNORMAL HIGH (ref 70–99)
POTASSIUM: 4.7 meq/L (ref 3.5–5.1)
Potassium: 4.7 mEq/L (ref 3.5–5.1)
Sodium: 139 mEq/L (ref 135–145)
Sodium: 139 mEq/L (ref 135–145)

## 2014-09-12 LAB — LIPID PANEL
Cholesterol: 122 mg/dL (ref 0–200)
HDL: 40.9 mg/dL (ref >39.00)
LDL CALC: 71 mg/dL (ref 0–99)
NonHDL: 81.1
Total CHOL/HDL Ratio: 3
Triglycerides: 53 mg/dL (ref 0.0–149.0)
VLDL: 10.6 mg/dL (ref 0.0–40.0)

## 2014-09-12 LAB — IBC PANEL
IRON: 47 ug/dL (ref 42–165)
Saturation Ratios: 15.1 % — ABNORMAL LOW (ref 20.0–50.0)
TRANSFERRIN: 222 mg/dL (ref 212.0–360.0)

## 2014-09-12 LAB — CBC
HEMATOCRIT: 34.2 % — AB (ref 39.0–52.0)
HEMOGLOBIN: 11.2 g/dL — AB (ref 13.0–17.0)
MCHC: 32.7 g/dL (ref 30.0–36.0)
MCV: 91.3 fl (ref 78.0–100.0)
PLATELETS: 282 10*3/uL (ref 150.0–400.0)
RBC: 3.74 Mil/uL — AB (ref 4.22–5.81)
RDW: 15.9 % — ABNORMAL HIGH (ref 11.5–15.5)
WBC: 6.6 10*3/uL (ref 4.0–10.5)

## 2014-09-12 LAB — HEMOGLOBIN A1C: Hgb A1c MFr Bld: 6.7 % — ABNORMAL HIGH (ref 4.6–6.5)

## 2014-09-12 LAB — TSH: TSH: 1.3 u[IU]/mL (ref 0.35–4.50)

## 2014-09-16 ENCOUNTER — Ambulatory Visit (INDEPENDENT_AMBULATORY_CARE_PROVIDER_SITE_OTHER): Payer: Medicare Other | Admitting: Internal Medicine

## 2014-09-16 ENCOUNTER — Encounter: Payer: Self-pay | Admitting: Internal Medicine

## 2014-09-16 VITALS — BP 125/80 | HR 80 | Temp 98.2°F | Resp 16 | Wt 360.0 lb

## 2014-09-16 DIAGNOSIS — I480 Paroxysmal atrial fibrillation: Secondary | ICD-10-CM | POA: Diagnosis not present

## 2014-09-16 DIAGNOSIS — E11319 Type 2 diabetes mellitus with unspecified diabetic retinopathy without macular edema: Secondary | ICD-10-CM

## 2014-09-16 DIAGNOSIS — I252 Old myocardial infarction: Secondary | ICD-10-CM | POA: Diagnosis not present

## 2014-09-16 DIAGNOSIS — I10 Essential (primary) hypertension: Secondary | ICD-10-CM

## 2014-09-16 DIAGNOSIS — I2510 Atherosclerotic heart disease of native coronary artery without angina pectoris: Secondary | ICD-10-CM | POA: Diagnosis not present

## 2014-09-16 DIAGNOSIS — E1165 Type 2 diabetes mellitus with hyperglycemia: Secondary | ICD-10-CM

## 2014-09-16 DIAGNOSIS — IMO0002 Reserved for concepts with insufficient information to code with codable children: Secondary | ICD-10-CM

## 2014-09-16 DIAGNOSIS — Z23 Encounter for immunization: Secondary | ICD-10-CM

## 2014-09-16 MED ORDER — POTASSIUM CHLORIDE CRYS ER 20 MEQ PO TBCR: 40.0000 meq | EXTENDED_RELEASE_TABLET | Freq: Every day | ORAL | Status: DC

## 2014-09-16 NOTE — Assessment & Plan Note (Signed)
Continue with current prescription therapy as reflected on the Med list.  

## 2014-09-16 NOTE — Progress Notes (Signed)
   Subjective:   HPI  The patient presents for a follow-up of  chronic hypertension, chronic dyslipidemia, type 2 diabetes controlled with medicines  F/u MI and CAP in 07/2014  Charles [name] wants to stop Xarelto due to cost and lawyers adds on TV and to go back on Plavix...  BP Readings from Last 3 Encounters:  09/16/14 125/80  09/03/14 168/79  08/30/14 126/82   Wt Readings from Last 3 Encounters:  09/16/14 360 lb (163.295 kg)  08/30/14 350 lb (158.759 kg)  08/27/14 343 lb (155.584 kg)     Review of Systems  Constitutional: Negative for appetite change, fatigue and unexpected weight change.  HENT: Negative for congestion, nosebleeds, sneezing, sore throat and trouble swallowing.   Eyes: Negative for itching and visual disturbance.  Respiratory: Negative for cough.   Cardiovascular: Negative for chest pain, palpitations and leg swelling.  Gastrointestinal: Negative for nausea, diarrhea, blood in stool and abdominal distention.  Genitourinary: Negative for frequency and hematuria.  Musculoskeletal: Negative for back pain, gait problem, joint swelling and neck pain.  Skin: Negative for rash.  Neurological: Negative for dizziness, tremors, speech difficulty and weakness.  Psychiatric/Behavioral: Negative for sleep disturbance, dysphoric mood and agitation. The patient is not nervous/anxious.        Objective:   Physical Exam  Constitutional: He is oriented to person, place, and time. He appears well-developed.  obese  HENT:  Mouth/Throat: Oropharynx is clear and moist.  Hard hearing  Eyes: Conjunctivae are normal. Pupils are equal, round, and reactive to light.  Neck: Normal range of motion. No JVD present. No thyromegaly present.  Cardiovascular: Normal rate, regular rhythm, normal heart sounds and intact distal pulses.  Exam reveals no gallop and no friction rub.   No murmur heard. Pulmonary/Chest: Effort normal and breath sounds normal. No respiratory distress. He has no  wheezes. He has no rales. He exhibits no tenderness.  Abdominal: Soft. Bowel sounds are normal. He exhibits no distension and no mass. There is no tenderness. There is no rebound and no guarding.  Musculoskeletal: Normal range of motion. He exhibits edema (trace  B ankles). He exhibits no tenderness.  Lymphadenopathy:    He has no cervical adenopathy.  Neurological: He is alert and oriented to person, place, and time. He has normal reflexes. No cranial nerve deficit. He exhibits normal muscle tone. Coordination normal.  Skin: Skin is warm and dry. No rash noted. No erythema.  No rash  Psychiatric: He has a normal mood and affect. His behavior is normal. Judgment and thought content normal.    Lab Results  Component Value Date   WBC 6.6 09/12/2014   WBC 6.6 09/12/2014   HGB 11.2* 09/12/2014   HGB 11.2* 09/12/2014   HCT 33.8* 09/12/2014   HCT 34.2* 09/12/2014   PLT 276.0 09/12/2014   PLT 282.0 09/12/2014   GLUCOSE 119* 09/12/2014   CHOL 122 09/12/2014   TRIG 53.0 09/12/2014   HDL 40.90 09/12/2014   LDLCALC 71 09/12/2014   ALT 13 09/12/2014   AST 14 09/12/2014   NA 139 09/12/2014   K 4.7 09/12/2014   CL 104 09/12/2014   CREATININE 0.8 09/12/2014   BUN 12 09/12/2014   CO2 32 09/12/2014   TSH 1.30 09/12/2014   PSA 0.96 08/28/2010   INR 1.35 08/03/2014   HGBA1C 6.7* 09/12/2014   MICROALBUR 1.4 08/28/2010         Assessment & Plan:

## 2014-09-16 NOTE — Assessment & Plan Note (Signed)
2000 angioplasty, 07/2014 Dr Harrington Challenger  Continue with current prescription therapy as reflected on the Med list.

## 2014-09-16 NOTE — Assessment & Plan Note (Signed)
PAF 3/15 Declined Coumadin Rash on Xarelto Agreed to take Plavix

## 2014-09-16 NOTE — Assessment & Plan Note (Signed)
Wt Readings from Last 3 Encounters:  09/16/14 360 lb (163.295 kg)  08/30/14 350 lb (158.759 kg)  08/27/14 343 lb (155.584 kg)

## 2014-09-16 NOTE — Assessment & Plan Note (Signed)
Continue with current prescription therapy as reflected on the Med list. BP Readings from Last 3 Encounters:  09/16/14 125/80  09/03/14 168/79  08/30/14 126/82

## 2014-09-16 NOTE — Progress Notes (Signed)
Pre visit review using our clinic review tool, if applicable. No additional management support is needed unless otherwise documented below in the visit note. 

## 2014-10-01 ENCOUNTER — Ambulatory Visit (INDEPENDENT_AMBULATORY_CARE_PROVIDER_SITE_OTHER): Payer: Medicare Other

## 2014-10-01 VITALS — BP 197/94 | HR 76 | Temp 97.3°F | Resp 18

## 2014-10-01 DIAGNOSIS — E11621 Type 2 diabetes mellitus with foot ulcer: Secondary | ICD-10-CM

## 2014-10-01 DIAGNOSIS — E1140 Type 2 diabetes mellitus with diabetic neuropathy, unspecified: Secondary | ICD-10-CM

## 2014-10-01 DIAGNOSIS — L97509 Non-pressure chronic ulcer of other part of unspecified foot with unspecified severity: Secondary | ICD-10-CM | POA: Diagnosis not present

## 2014-10-01 DIAGNOSIS — M2040 Other hammer toe(s) (acquired), unspecified foot: Secondary | ICD-10-CM

## 2014-10-01 DIAGNOSIS — L89891 Pressure ulcer of other site, stage 1: Secondary | ICD-10-CM

## 2014-10-01 NOTE — Progress Notes (Signed)
   Subjective:    Patient ID: Bonnielee Haff, male    DOB: Apr 15, 1941, 73 y.o.   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 remaining digits also have contractures with associated history of ulcer although no signs apparent discharge drainage no changes noted otherwise   Review of Systems no systemic changes or findings noted at this     Objective:   Physical Exam Vascular status is intact pedal pulses palpable DP thready at plus one over 4 PT nonpalpable temperature warm to cool turgor diminished ulcers don't about 2-3 mm over the dorsum of the hallux IP joint right great toe down to dermal level only no purulence no discharge drainage no malodor no ascending cellulitis or lymphangitis noted remaining digits otherwise stable nails thick brittle crumbly criptotic and criptotic likely debridement with the next month or 2. The ulcer appears stable is complete antibiotic regimen as patient is maintaining daily dressing changes and Silvadene gauze dressing to the hallux as instructed. 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 Patient is instructed break-in use of shoes his old shoes are worn and Aquasol or holding his toes arch is slight running shoes new shoes fit and contour well with adequate contact a pair of Plastizote lining some the entire shoe accommodative type diabetic accident shoes dispensed recheck  in one month for long-term followup creatinine slowly wearing an hour the first day 2 or 3 hours the next slowly increasing use and activities to contact us visiting changes or exacerbations maintain care as instructed with Silvadene and gauze dressing changes next paragraph Harriet Masson DPM

## 2014-10-01 NOTE — Patient Instructions (Signed)
ANTIBACTERIAL SOAP INSTRUCTIONS  THE DAY AFTER PROCEDURE  Please follow the instructions your doctor has marked.   Shower as usual. Before getting out, place a drop of antibacterial liquid soap (Dial) on a wet, clean washcloth.  Gently wipe washcloth over affected area.  Afterward, rinse the area with warm water.  Blot the area dry with a soft cloth and cover with antibiotic ointment (neosporin, polysporin, bacitracin) and band aid or gauze and tape  Place 3-4 drops of antibacterial liquid soap in a quart of warm tap water.  Submerge foot into water for 20 minutes.  If bandage was applied after your procedure, leave on to allow for easy lift off, then remove and continue with soak for the remaining time.  Next, blot area dry with a soft cloth and cover with a bandage.  Apply other medications as directed by your doctor, such as cortisporin otic solution (eardrops) or neosporin antibiotic ointment  Continue with soap and water cleansing of the ulcer Silvadene and Band-Aid or gauze dressing of the right great toe. Maintain tube foam padding. Next paragraph slowly break in the new diabetic shoes and insoles over the next couple of days.

## 2014-10-07 ENCOUNTER — Telehealth: Payer: Self-pay | Admitting: Internal Medicine

## 2014-10-07 NOTE — Telephone Encounter (Signed)
Requesting appt this week for medication dosage change, c-pack? Steriod? Pt feels like he has to remind himself to breathe when he lies down. Pt requests Dr Alain Marion only. Please advise.

## 2014-10-07 NOTE — Telephone Encounter (Signed)
Appt made 10/29 @11 :30, pt aware.

## 2014-10-07 NOTE — Telephone Encounter (Signed)
Dr. Alain Marion is it ok to double book. You have no opening this week...Charles [name]

## 2014-10-07 NOTE — Telephone Encounter (Signed)
Ok Thur 11:30 or w/another provider Thx

## 2014-10-10 ENCOUNTER — Ambulatory Visit (INDEPENDENT_AMBULATORY_CARE_PROVIDER_SITE_OTHER): Payer: Medicare Other | Admitting: Internal Medicine

## 2014-10-10 ENCOUNTER — Encounter: Payer: Self-pay | Admitting: Internal Medicine

## 2014-10-10 VITALS — BP 140/60 | HR 68 | Temp 97.4°F | Wt 357.0 lb

## 2014-10-10 DIAGNOSIS — I10 Essential (primary) hypertension: Secondary | ICD-10-CM

## 2014-10-10 DIAGNOSIS — E11319 Type 2 diabetes mellitus with unspecified diabetic retinopathy without macular edema: Secondary | ICD-10-CM

## 2014-10-10 DIAGNOSIS — IMO0002 Reserved for concepts with insufficient information to code with codable children: Secondary | ICD-10-CM

## 2014-10-10 DIAGNOSIS — I2510 Atherosclerotic heart disease of native coronary artery without angina pectoris: Secondary | ICD-10-CM

## 2014-10-10 DIAGNOSIS — E1149 Type 2 diabetes mellitus with other diabetic neurological complication: Secondary | ICD-10-CM

## 2014-10-10 DIAGNOSIS — J441 Chronic obstructive pulmonary disease with (acute) exacerbation: Secondary | ICD-10-CM | POA: Diagnosis not present

## 2014-10-10 DIAGNOSIS — E1140 Type 2 diabetes mellitus with diabetic neuropathy, unspecified: Secondary | ICD-10-CM

## 2014-10-10 DIAGNOSIS — E1165 Type 2 diabetes mellitus with hyperglycemia: Secondary | ICD-10-CM

## 2014-10-10 MED ORDER — UMECLIDINIUM-VILANTEROL 62.5-25 MCG/INH IN AEPB: 1.0000 | INHALATION_SPRAY | Freq: Every day | RESPIRATORY_TRACT | Status: DC

## 2014-10-10 NOTE — Assessment & Plan Note (Signed)
Continue with current prescription therapy as reflected on the Med list.  

## 2014-10-10 NOTE — Progress Notes (Signed)
Subjective:   HPI  C/o SOB, wheezing at times  The patient presents for a follow-up of  chronic hypertension, chronic dyslipidemia, type 2 diabetes controlled with medicines  F/u MI and CAP in 07/2014  Charles [name] wants to stop Xarelto due to cost and lawyers adds on TV and to go back on Plavix...  BP Readings from Last 3 Encounters:  10/10/14 140/60  10/01/14 197/94  09/16/14 125/80   Wt Readings from Last 3 Encounters:  10/10/14 357 lb (161.934 kg)  09/16/14 360 lb (163.295 kg)  08/30/14 350 lb (158.759 kg)     Review of Systems  Constitutional: Negative for appetite change, fatigue and unexpected weight change.  HENT: Negative for congestion, nosebleeds, sneezing, sore throat and trouble swallowing.   Eyes: Negative for itching and visual disturbance.  Respiratory: Negative for cough.   Cardiovascular: Negative for chest pain, palpitations and leg swelling.  Gastrointestinal: Negative for nausea, diarrhea, blood in stool and abdominal distention.  Genitourinary: Negative for frequency and hematuria.  Musculoskeletal: Negative for back pain, gait problem, joint swelling and neck pain.  Skin: Negative for rash.  Neurological: Negative for dizziness, tremors, speech difficulty and weakness.  Psychiatric/Behavioral: Negative for sleep disturbance, dysphoric mood and agitation. The patient is not nervous/anxious.        Objective:   Physical Exam  Constitutional: He is oriented to person, place, and time. He appears well-developed.  obese  HENT:  Mouth/Throat: Oropharynx is clear and moist.  Hard hearing  Eyes: Conjunctivae are normal. Pupils are equal, round, and reactive to light.  Neck: Normal range of motion. No JVD present. No thyromegaly present.  Cardiovascular: Normal rate, regular rhythm, normal heart sounds and intact distal pulses.  Exam reveals no gallop and no friction rub.   No murmur heard. Pulmonary/Chest: Effort normal and breath sounds normal. No  respiratory distress. He has no wheezes. He has no rales. He exhibits no tenderness.  Abdominal: Soft. Bowel sounds are normal. He exhibits no distension and no mass. There is no tenderness. There is no rebound and no guarding.  Musculoskeletal: Normal range of motion. He exhibits edema (trace  B ankles). He exhibits no tenderness.  Lymphadenopathy:    He has no cervical adenopathy.  Neurological: He is alert and oriented to person, place, and time. He has normal reflexes. No cranial nerve deficit. He exhibits normal muscle tone. Coordination normal.  Skin: Skin is warm and dry. No rash noted. No erythema.  No rash  Psychiatric: He has a normal mood and affect. His behavior is normal. Judgment and thought content normal.    Lab Results  Component Value Date   WBC 6.6 09/12/2014   WBC 6.6 09/12/2014   HGB 11.2* 09/12/2014   HGB 11.2* 09/12/2014   HCT 33.8* 09/12/2014   HCT 34.2* 09/12/2014   PLT 276.0 09/12/2014   PLT 282.0 09/12/2014   GLUCOSE 119* 09/12/2014   CHOL 122 09/12/2014   TRIG 53.0 09/12/2014   HDL 40.90 09/12/2014   LDLCALC 71 09/12/2014   ALT 13 09/12/2014   AST 14 09/12/2014   NA 139 09/12/2014   K 4.7 09/12/2014   CL 104 09/12/2014   CREATININE 0.8 09/12/2014   BUN 12 09/12/2014   CO2 32 09/12/2014   TSH 1.30 09/12/2014   PSA 0.96 08/28/2010   INR 1.35 08/03/2014   HGBA1C 6.7* 09/12/2014   MICROALBUR 1.4 08/28/2010    I personally provided Anoro  inhaler use teaching. After the teaching patient was able to  demonstrate it's use effectively. All questions were answered      Assessment & Plan:

## 2014-10-10 NOTE — Progress Notes (Signed)
Pre visit review using our clinic review tool, if applicable. No additional management support is needed unless otherwise documented below in the visit note. 

## 2014-10-10 NOTE — Assessment & Plan Note (Signed)
10/15 h/o remote smoking, PNA summer 2015 Start Anoro qd

## 2014-10-29 ENCOUNTER — Ambulatory Visit: Payer: Medicare Other

## 2014-11-05 ENCOUNTER — Ambulatory Visit: Payer: Medicare Other

## 2014-11-21 ENCOUNTER — Encounter (HOSPITAL_COMMUNITY): Payer: Self-pay | Admitting: Cardiovascular Disease

## 2014-12-13 DIAGNOSIS — J189 Pneumonia, unspecified organism: Secondary | ICD-10-CM

## 2014-12-13 HISTORY — DX: Pneumonia, unspecified organism: J18.9

## 2014-12-17 ENCOUNTER — Ambulatory Visit (INDEPENDENT_AMBULATORY_CARE_PROVIDER_SITE_OTHER): Payer: Medicare Other | Admitting: Internal Medicine

## 2014-12-17 ENCOUNTER — Encounter: Payer: Self-pay | Admitting: Internal Medicine

## 2014-12-17 ENCOUNTER — Other Ambulatory Visit (INDEPENDENT_AMBULATORY_CARE_PROVIDER_SITE_OTHER): Payer: Medicare Other

## 2014-12-17 VITALS — BP 160/80 | HR 76 | Temp 97.6°F | Resp 16 | Ht 76.0 in | Wt 376.0 lb

## 2014-12-17 DIAGNOSIS — E538 Deficiency of other specified B group vitamins: Secondary | ICD-10-CM | POA: Diagnosis not present

## 2014-12-17 DIAGNOSIS — I2510 Atherosclerotic heart disease of native coronary artery without angina pectoris: Secondary | ICD-10-CM | POA: Diagnosis not present

## 2014-12-17 DIAGNOSIS — R635 Abnormal weight gain: Secondary | ICD-10-CM

## 2014-12-17 DIAGNOSIS — J449 Chronic obstructive pulmonary disease, unspecified: Secondary | ICD-10-CM | POA: Diagnosis not present

## 2014-12-17 DIAGNOSIS — I480 Paroxysmal atrial fibrillation: Secondary | ICD-10-CM

## 2014-12-17 DIAGNOSIS — I10 Essential (primary) hypertension: Secondary | ICD-10-CM | POA: Diagnosis not present

## 2014-12-17 DIAGNOSIS — E1165 Type 2 diabetes mellitus with hyperglycemia: Secondary | ICD-10-CM | POA: Diagnosis not present

## 2014-12-17 DIAGNOSIS — E11319 Type 2 diabetes mellitus with unspecified diabetic retinopathy without macular edema: Secondary | ICD-10-CM | POA: Diagnosis not present

## 2014-12-17 DIAGNOSIS — IMO0002 Reserved for concepts with insufficient information to code with codable children: Secondary | ICD-10-CM

## 2014-12-17 LAB — BASIC METABOLIC PANEL
BUN: 17 mg/dL (ref 6–23)
CHLORIDE: 103 meq/L (ref 96–112)
CO2: 29 meq/L (ref 19–32)
Calcium: 8.9 mg/dL (ref 8.4–10.5)
Creatinine, Ser: 0.8 mg/dL (ref 0.4–1.5)
GFR: 103.61 mL/min (ref >60.00)
GLUCOSE: 118 mg/dL — AB (ref 70–99)
Potassium: 3.7 mEq/L (ref 3.5–5.1)
SODIUM: 140 meq/L (ref 135–145)

## 2014-12-17 LAB — HEMOGLOBIN A1C: HEMOGLOBIN A1C: 6.8 % — AB (ref 4.6–6.5)

## 2014-12-17 MED ORDER — TRIAMCINOLONE ACETONIDE 0.5 % EX OINT: 1.0000 "application " | TOPICAL_OINTMENT | Freq: Two times a day (BID) | CUTANEOUS | Status: DC

## 2014-12-17 MED ORDER — FLUTICASONE-SALMETEROL 250-50 MCG/DOSE IN AEPB: 1.0000 | INHALATION_SPRAY | Freq: Two times a day (BID) | RESPIRATORY_TRACT | Status: DC

## 2014-12-17 MED ORDER — BREATHERITE COLL SPACER ADULT MISC: Status: DC

## 2014-12-17 MED ORDER — ALBUTEROL SULFATE HFA 108 (90 BASE) MCG/ACT IN AERS: 2.0000 | INHALATION_SPRAY | Freq: Four times a day (QID) | RESPIRATORY_TRACT | Status: DC | PRN

## 2014-12-17 NOTE — Assessment & Plan Note (Signed)
Continue with current prescription therapy as reflected on the Med list. Labs  

## 2014-12-17 NOTE — Progress Notes (Signed)
Subjective:   HPI  C/o rash/eczema on hands at times  F/u SOB - inhalers help  The patient presents for a follow-up of  chronic hypertension, chronic dyslipidemia, type 2 diabetes controlled with medicines  F/u MI and CAP in 07/2014  [name] wants to stop Xarelto due to cost and lawyers adds on TV and to go back on Plavix...  BP Readings from Last 3 Encounters:  12/17/14 160/80  10/10/14 140/60  10/01/14 197/94   Wt Readings from Last 3 Encounters:  12/17/14 376 lb (170.552 kg)  10/10/14 357 lb (161.934 kg)  09/16/14 360 lb (163.295 kg)     Review of Systems  Constitutional: Negative for appetite change, fatigue and unexpected weight change.  HENT: Negative for congestion, nosebleeds, sneezing, sore throat and trouble swallowing.   Eyes: Negative for itching and visual disturbance.  Respiratory: Negative for cough.   Cardiovascular: Negative for chest pain, palpitations and leg swelling.  Gastrointestinal: Negative for nausea, diarrhea, blood in stool and abdominal distention.  Genitourinary: Negative for frequency and hematuria.  Musculoskeletal: Negative for back pain, joint swelling, gait problem and neck pain.  Skin: Negative for rash.  Neurological: Negative for dizziness, tremors, speech difficulty and weakness.  Psychiatric/Behavioral: Negative for sleep disturbance, dysphoric mood and agitation. The patient is not nervous/anxious.        Objective:   Physical Exam  Constitutional: He is oriented to person, place, and time. He appears well-developed. No distress.  NAD  HENT:  Mouth/Throat: Oropharynx is clear and moist.  Eyes: Conjunctivae are normal. Pupils are equal, round, and reactive to light.  Neck: Normal range of motion. No JVD present. No thyromegaly present.  Cardiovascular: Normal rate, regular rhythm, normal heart sounds and intact distal pulses.  Exam reveals no gallop and no friction rub.   No murmur heard. Pulmonary/Chest: Effort normal  and breath sounds normal. No respiratory distress. He has no wheezes. He has no rales. He exhibits no tenderness.  Abdominal: Soft. Bowel sounds are normal. He exhibits no distension and no mass. There is no tenderness. There is no rebound and no guarding.  Musculoskeletal: Normal range of motion. He exhibits no edema or tenderness.  Lymphadenopathy:    He has no cervical adenopathy.  Neurological: He is alert and oriented to person, place, and time. He has normal reflexes. No cranial nerve deficit. He exhibits normal muscle tone. He displays a negative Romberg sign. Coordination and gait normal.  No meningeal signs  Skin: Skin is warm and dry. No rash noted.  Psychiatric: He has a normal mood and affect. His behavior is normal. Judgment and thought content normal.  dry skin on hands Obese  Lab Results  Component Value Date   WBC 6.6 09/12/2014   WBC 6.6 09/12/2014   HGB 11.2* 09/12/2014   HGB 11.2* 09/12/2014   HCT 33.8* 09/12/2014   HCT 34.2* 09/12/2014   PLT 276.0 09/12/2014   PLT 282.0 09/12/2014   GLUCOSE 119* 09/12/2014   CHOL 122 09/12/2014   TRIG 53.0 09/12/2014   HDL 40.90 09/12/2014   LDLCALC 71 09/12/2014   ALT 13 09/12/2014   AST 14 09/12/2014   NA 139 09/12/2014   K 4.7 09/12/2014   CL 104 09/12/2014   CREATININE 0.8 09/12/2014   BUN 12 09/12/2014   CO2 32 09/12/2014   TSH 1.30 09/12/2014   PSA 0.96 08/28/2010   INR 1.35 08/03/2014   HGBA1C 6.7* 09/12/2014   MICROALBUR 1.4 08/28/2010  Assessment & Plan:

## 2014-12-17 NOTE — Assessment & Plan Note (Signed)
On Plavix 

## 2014-12-17 NOTE — Assessment & Plan Note (Signed)
Wt Readings from Last 3 Encounters:  12/17/14 376 lb (170.552 kg)  10/10/14 357 lb (161.934 kg)  09/16/14 360 lb (163.295 kg)

## 2014-12-17 NOTE — Assessment & Plan Note (Signed)
Continue with current prescription therapy as reflected on the Med list.  

## 2014-12-17 NOTE — Assessment & Plan Note (Signed)
Will switch to Advair and Proventil per pt's request

## 2014-12-17 NOTE — Patient Instructions (Signed)
Low carb diet 

## 2014-12-17 NOTE — Assessment & Plan Note (Signed)
Continue with current prescription therapy as reflected on the Med list. Monitor BP at home Loose wt

## 2015-01-16 ENCOUNTER — Encounter: Payer: Self-pay | Admitting: Internal Medicine

## 2015-02-03 ENCOUNTER — Telehealth: Payer: Self-pay | Admitting: Internal Medicine

## 2015-02-03 NOTE — Telephone Encounter (Signed)
Received phone call from [facility] requesting last office note be faxed to [phone]. They state they need this to fill order for patient's diabetic testing supplies.

## 2015-02-04 NOTE — Telephone Encounter (Signed)
OK. Thx

## 2015-02-05 NOTE — Telephone Encounter (Signed)
Faxed ov note 12/17/14 to fax # below...Charles [name]

## 2015-03-28 ENCOUNTER — Telehealth: Payer: Self-pay | Admitting: *Deleted

## 2015-03-28 MED ORDER — DOXYCYCLINE HYCLATE 100 MG PO TABS: 100.0000 mg | ORAL_TABLET | Freq: Two times a day (BID) | ORAL | Status: DC

## 2015-03-28 NOTE — Telephone Encounter (Signed)
Pt states the right 4th toe is red and swollen, would like an appt Monday, and an antibiotic to get him to the appt.  Dr. Jacqualyn Posey ordered Doxycycline 100mg  #20 one bid, use the Silvadene dressings daily and come in 03/31/2015, go to ER if redness, swelling or fever.  Pt agreed and is scheduled for 03/31/2015 at 1130am with Dr. Paulla Dolly.

## 2015-03-31 ENCOUNTER — Ambulatory Visit (INDEPENDENT_AMBULATORY_CARE_PROVIDER_SITE_OTHER): Payer: Medicare Other | Admitting: Podiatry

## 2015-03-31 ENCOUNTER — Encounter: Payer: Self-pay | Admitting: Podiatry

## 2015-03-31 VITALS — BP 127/65 | HR 58 | Temp 98.6°F | Resp 18

## 2015-03-31 DIAGNOSIS — E11621 Type 2 diabetes mellitus with foot ulcer: Secondary | ICD-10-CM

## 2015-03-31 DIAGNOSIS — L97509 Non-pressure chronic ulcer of other part of unspecified foot with unspecified severity: Secondary | ICD-10-CM

## 2015-03-31 DIAGNOSIS — M2040 Other hammer toe(s) (acquired), unspecified foot: Secondary | ICD-10-CM

## 2015-03-31 DIAGNOSIS — I2510 Atherosclerotic heart disease of native coronary artery without angina pectoris: Secondary | ICD-10-CM | POA: Diagnosis not present

## 2015-03-31 MED ORDER — DOXYCYCLINE HYCLATE 100 MG PO TABS: 100.0000 mg | ORAL_TABLET | Freq: Two times a day (BID) | ORAL | Status: DC

## 2015-03-31 NOTE — Progress Notes (Signed)
   Subjective:    Patient ID: Charles [name], male    DOB: Aug 01, 1941, 74 y.o.   MRN: [id number]  HPI i have some redness on my 4th toe on my right foot and is open and is sore and tender and started on Friday and i am soaking it in dial soap and using silvadene and called over here Friday and they were suppose to get me a antibiotic and i never got one    Review of Systems     Objective:   Physical Exam        Assessment & Plan:

## 2015-04-01 NOTE — Progress Notes (Signed)
Subjective:     Patient ID: Charles [name], male   DOB: 1941-06-05, 74 y.o.   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 Fourth toe right shows mild dorsal redness with keratotic lesion formation that's localized with no proximal edema erythema drainage noted and no active drainage occurring from the site    Assessment:     Irritated fourth toe right with possible mild localized infection process and no indications of proximal infection    Plan:     As precautionary measure placed on Cleocin and gave instructions on soaks. If redness should get any worse or any increased drainage or pain were to occur patient is to reappoint [place] immediately or any systemic signs of infection were to occur patient is to go straight to the emergency room for check. Patient is also scheduling with his vascular doctor for circulatory studies

## 2015-04-04 ENCOUNTER — Ambulatory Visit (HOSPITAL_COMMUNITY)
Admission: RE | Admit: 2015-04-04 | Discharge: 2015-04-04 | Disposition: A | Discharge status: Home / Self Care | Payer: Medicare Other | Source: Ambulatory Visit | Attending: Vascular Surgery | Admitting: Vascular Surgery

## 2015-04-04 ENCOUNTER — Other Ambulatory Visit: Payer: Self-pay | Admitting: Podiatry

## 2015-04-04 DIAGNOSIS — L97919 Non-pressure chronic ulcer of unspecified part of right lower leg with unspecified severity: Secondary | ICD-10-CM | POA: Diagnosis not present

## 2015-04-04 DIAGNOSIS — I10 Essential (primary) hypertension: Secondary | ICD-10-CM | POA: Diagnosis not present

## 2015-04-04 DIAGNOSIS — L97509 Non-pressure chronic ulcer of other part of unspecified foot with unspecified severity: Secondary | ICD-10-CM

## 2015-04-04 DIAGNOSIS — L97519 Non-pressure chronic ulcer of other part of right foot with unspecified severity: Secondary | ICD-10-CM | POA: Diagnosis not present

## 2015-04-04 DIAGNOSIS — M2040 Other hammer toe(s) (acquired), unspecified foot: Secondary | ICD-10-CM

## 2015-04-04 DIAGNOSIS — Z89429 Acquired absence of other toe(s), unspecified side: Secondary | ICD-10-CM | POA: Insufficient documentation

## 2015-04-04 DIAGNOSIS — E785 Hyperlipidemia, unspecified: Secondary | ICD-10-CM | POA: Insufficient documentation

## 2015-04-04 DIAGNOSIS — E11621 Type 2 diabetes mellitus with foot ulcer: Secondary | ICD-10-CM | POA: Diagnosis not present

## 2015-04-18 ENCOUNTER — Encounter: Payer: Self-pay | Admitting: Internal Medicine

## 2015-04-18 ENCOUNTER — Other Ambulatory Visit (INDEPENDENT_AMBULATORY_CARE_PROVIDER_SITE_OTHER): Payer: Medicare Other

## 2015-04-18 ENCOUNTER — Ambulatory Visit (INDEPENDENT_AMBULATORY_CARE_PROVIDER_SITE_OTHER): Payer: Medicare Other | Admitting: Internal Medicine

## 2015-04-18 VITALS — BP 140/72 | HR 71 | Temp 97.9°F | Wt 356.0 lb

## 2015-04-18 DIAGNOSIS — IMO0002 Reserved for concepts with insufficient information to code with codable children: Secondary | ICD-10-CM

## 2015-04-18 DIAGNOSIS — J439 Emphysema, unspecified: Secondary | ICD-10-CM

## 2015-04-18 DIAGNOSIS — I480 Paroxysmal atrial fibrillation: Secondary | ICD-10-CM

## 2015-04-18 DIAGNOSIS — E1165 Type 2 diabetes mellitus with hyperglycemia: Secondary | ICD-10-CM

## 2015-04-18 DIAGNOSIS — E11319 Type 2 diabetes mellitus with unspecified diabetic retinopathy without macular edema: Secondary | ICD-10-CM

## 2015-04-18 DIAGNOSIS — I2510 Atherosclerotic heart disease of native coronary artery without angina pectoris: Secondary | ICD-10-CM

## 2015-04-18 DIAGNOSIS — I10 Essential (primary) hypertension: Secondary | ICD-10-CM

## 2015-04-18 LAB — BASIC METABOLIC PANEL
BUN: 17 mg/dL (ref 6–23)
CO2: 32 meq/L (ref 19–32)
CREATININE: 0.77 mg/dL (ref 0.40–1.50)
Calcium: 9.3 mg/dL (ref 8.4–10.5)
Chloride: 102 mEq/L (ref 96–112)
GFR: 105.07 mL/min (ref >60.00)
Glucose, Bld: 153 mg/dL — ABNORMAL HIGH (ref 70–99)
Potassium: 4.2 mEq/L (ref 3.5–5.1)
Sodium: 137 mEq/L (ref 135–145)

## 2015-04-18 LAB — HEPATIC FUNCTION PANEL
ALT: 19 U/L (ref 0–53)
AST: 24 U/L (ref 0–37)
Albumin: 3.7 g/dL (ref 3.5–5.2)
Alkaline Phosphatase: 60 U/L (ref 39–117)
Bilirubin, Direct: 0.1 mg/dL (ref 0.0–0.3)
Total Bilirubin: 0.5 mg/dL (ref 0.2–1.2)
Total Protein: 6.9 g/dL (ref 6.0–8.3)

## 2015-04-18 LAB — HEMOGLOBIN A1C: Hgb A1c MFr Bld: 6.3 % (ref 4.6–6.5)

## 2015-04-18 LAB — TSH: TSH: 1.19 u[IU]/mL (ref 0.35–4.50)

## 2015-04-18 MED ORDER — TRIAMCINOLONE ACETONIDE 0.5 % EX CREA: 1.0000 "application " | TOPICAL_CREAM | Freq: Two times a day (BID) | CUTANEOUS | Status: DC | PRN

## 2015-04-18 NOTE — Assessment & Plan Note (Signed)
  On Rx:  Lopressor, Plavix, ASA Pt lost wt on a low carb diet

## 2015-04-18 NOTE — Progress Notes (Signed)
Subjective:   HPI  Pt lost wt on a low carb diet - lost 45 lbs and SOB has resolved   F/u SOB - inhalers help  The patient presents for a follow-up of  chronic hypertension, chronic dyslipidemia, type 2 diabetes controlled with medicines  F/u MI and CAP in 07/2014  Charles [name] wants to stop Xarelto due to cost and lawyers adds on TV and to go back on Plavix...  BP Readings from Last 3 Encounters:  04/18/15 140/72  03/31/15 127/65  12/17/14 160/80   Wt Readings from Last 3 Encounters:  04/18/15 356 lb (161.481 kg)  12/17/14 376 lb (170.552 kg)  10/10/14 357 lb (161.934 kg)     Review of Systems  Constitutional: Negative for appetite change, fatigue and unexpected weight change.  HENT: Negative for congestion, nosebleeds, sneezing, sore throat and trouble swallowing.   Eyes: Negative for itching and visual disturbance.  Respiratory: Negative for cough.   Cardiovascular: Negative for chest pain, palpitations and leg swelling.  Gastrointestinal: Negative for nausea, diarrhea, blood in stool and abdominal distention.  Genitourinary: Negative for frequency and hematuria.  Musculoskeletal: Negative for back pain, joint swelling, gait problem and neck pain.  Skin: Negative for rash.  Neurological: Negative for dizziness, tremors, speech difficulty and weakness.  Psychiatric/Behavioral: Negative for sleep disturbance, dysphoric mood and agitation. The patient is not nervous/anxious.        Objective:   Physical Exam  Constitutional: He is oriented to person, place, and time. He appears well-developed. No distress.  NAD  HENT:  Mouth/Throat: Oropharynx is clear and moist.  Eyes: Conjunctivae are normal. Pupils are equal, round, and reactive to light.  Neck: Normal range of motion. No JVD present. No thyromegaly present.  Cardiovascular: Normal rate, regular rhythm, normal heart sounds and intact distal pulses.  Exam reveals no gallop and no friction rub.   No murmur  heard. Pulmonary/Chest: Effort normal and breath sounds normal. No respiratory distress. He has no wheezes. He has no rales. He exhibits no tenderness.  Abdominal: Soft. Bowel sounds are normal. He exhibits no distension and no mass. There is no tenderness. There is no rebound and no guarding.  Musculoskeletal: Normal range of motion. He exhibits no edema or tenderness.  Lymphadenopathy:    He has no cervical adenopathy.  Neurological: He is alert and oriented to person, place, and time. He has normal reflexes. No cranial nerve deficit. He exhibits normal muscle tone. He displays a negative Romberg sign. Coordination and gait normal.  No meningeal signs  Skin: Skin is warm and dry. No rash noted.  Psychiatric: He has a normal mood and affect. His behavior is normal. Judgment and thought content normal.  dry skin on hands Obese - lost wt  Lab Results  Component Value Date   WBC 6.6 09/12/2014   WBC 6.6 09/12/2014   HGB 11.2* 09/12/2014   HGB 11.2* 09/12/2014   HCT 33.8* 09/12/2014   HCT 34.2* 09/12/2014   PLT 276.0 09/12/2014   PLT 282.0 09/12/2014   GLUCOSE 118* 12/17/2014   CHOL 122 09/12/2014   TRIG 53.0 09/12/2014   HDL 40.90 09/12/2014   LDLCALC 71 09/12/2014   ALT 13 09/12/2014   AST 14 09/12/2014   NA 140 12/17/2014   K 3.7 12/17/2014   CL 103 12/17/2014   CREATININE 0.8 12/17/2014   BUN 17 12/17/2014   CO2 29 12/17/2014   TSH 1.30 09/12/2014   PSA 0.96 08/28/2010   INR 1.35 08/03/2014  HGBA1C 6.8* 12/17/2014   MICROALBUR 1.4 08/28/2010         Assessment & Plan:

## 2015-04-18 NOTE — Assessment & Plan Note (Signed)
Chronic   On Rx: Losartan, Lopressor, Lasix, Pravachol, Plavix, ASA Pt lost wt on a low carb diet

## 2015-04-18 NOTE — Assessment & Plan Note (Signed)
Glimepiride  Labs 

## 2015-04-18 NOTE — Assessment & Plan Note (Signed)
SOB is better after wt loss

## 2015-04-18 NOTE — Assessment & Plan Note (Signed)
SOB is better after wt loss on a low carb diet - lost 45 lbs

## 2015-04-18 NOTE — Assessment & Plan Note (Signed)
On Rx: Losartan, Lopressor, Lasix Pt lost wt on a low carb diet

## 2015-04-18 NOTE — Progress Notes (Signed)
Pre visit review using our clinic review tool, if applicable. No additional management support is needed unless otherwise documented below in the visit note. 

## 2015-05-06 ENCOUNTER — Encounter: Payer: Self-pay | Admitting: Internal Medicine

## 2015-05-06 ENCOUNTER — Ambulatory Visit: Payer: Medicare Other | Admitting: Family

## 2015-05-06 ENCOUNTER — Ambulatory Visit (INDEPENDENT_AMBULATORY_CARE_PROVIDER_SITE_OTHER): Payer: Medicare Other | Admitting: Internal Medicine

## 2015-05-06 ENCOUNTER — Telehealth: Payer: Self-pay | Admitting: Internal Medicine

## 2015-05-06 VITALS — BP 122/74 | HR 79 | Temp 97.9°F | Resp 18 | Wt 351.4 lb

## 2015-05-06 DIAGNOSIS — I2510 Atherosclerotic heart disease of native coronary artery without angina pectoris: Secondary | ICD-10-CM | POA: Diagnosis not present

## 2015-05-06 DIAGNOSIS — L03115 Cellulitis of right lower limb: Secondary | ICD-10-CM

## 2015-05-06 DIAGNOSIS — L0390 Cellulitis, unspecified: Secondary | ICD-10-CM | POA: Insufficient documentation

## 2015-05-06 MED ORDER — SULFAMETHOXAZOLE-TRIMETHOPRIM 800-160 MG PO TABS: 1.0000 | ORAL_TABLET | Freq: Two times a day (BID) | ORAL | Status: DC

## 2015-05-06 NOTE — Telephone Encounter (Signed)
Dr Doug Sou told patient to come back this Friday for an appointment to recheck his leg, she does not have any availability. A spot will have to be opened for him, please call him with his appt time. Thank you

## 2015-05-06 NOTE — Patient Instructions (Signed)
We have sent in an antibiotic called bactrim which helps to fight the infection in the leg. Take 1 pill when you get home then take 1 pill twice a day for 1 week.   If you are no better tomorrow or if the redness is spreading I want you to go to the ER as you may need IV antibiotics in the hospital.  If the infection is getting better than keep taking the antibiotic and keep taking the doxycycline as well. We would like you to come back on Friday for a visit for them to check out the leg.   Cellulitis Cellulitis is an infection of the skin and the tissue beneath it. The infected area is usually red and tender. Cellulitis occurs most often in the arms and lower legs.  CAUSES  Cellulitis is caused by bacteria that enter the skin through cracks or cuts in the skin. The most common types of bacteria that cause cellulitis are staphylococci and streptococci. SIGNS AND SYMPTOMS   Redness and warmth.  Swelling.  Tenderness or pain.  Fever. DIAGNOSIS  Your health care provider can usually determine what is wrong based on a physical exam. Blood tests may also be done. TREATMENT  Treatment usually involves taking an antibiotic medicine. HOME CARE INSTRUCTIONS   Take your antibiotic medicine as directed by your health care provider. Finish the antibiotic even if you start to feel better.  Keep the infected arm or leg elevated to reduce swelling.  Apply a warm cloth to the affected area up to 4 times per day to relieve pain.  Take medicines only as directed by your health care provider.  Keep all follow-up visits as directed by your health care provider. SEEK MEDICAL CARE IF:   You notice red streaks coming from the infected area.  Your red area gets larger or turns dark in color.  Your bone or joint underneath the infected area becomes painful after the skin has healed.  Your infection returns in the same area or another area.  You notice a swollen bump in the infected area.  You  develop new symptoms.  You have a fever. SEEK IMMEDIATE MEDICAL CARE IF:   You feel very sleepy.  You develop vomiting or diarrhea.  You have a general ill feeling (malaise) with muscle aches and pains. MAKE SURE YOU:   Understand these instructions.  Will watch your condition.  Will get help right away if you are not doing well or get worse. Document Released: 09/08/2005 Document Revised: 04/15/2014 Document Reviewed: 02/14/2012 [facility] Patient Information 2015 [place], [place]. This information is not intended to replace advice given to you by your health care provider. Make sure you discuss any questions you have with your health care provider.

## 2015-05-06 NOTE — Telephone Encounter (Signed)
Patient can be seen by any physician Friday, not specifically Dr. Doug Sou.

## 2015-05-06 NOTE — Progress Notes (Signed)
   Subjective:    Patient ID: Charles [name], male    DOB: [birth year], 74 y.o.   MRN: [id number]  HPI The patient is a 74 YO man who is coming in for rash on his leg. He also has a sore on his foot for which he was given doxycycline. The rash is spreading and sore to the touch. It is hot to the touch. No fevers or chills. He is also a diabetic and has not noticed a change to his sugar levels. Going on for 3 days and getting worse.   Review of Systems  Constitutional: Negative for fever, activity change, appetite change, fatigue and unexpected weight change.  Respiratory: Negative.   Cardiovascular: Negative.   Gastrointestinal: Negative.   Musculoskeletal: Positive for myalgias and gait problem.  Skin: Positive for color change, rash and wound.  Neurological: Negative.       Objective:   Physical Exam  Constitutional: He appears well-developed and well-nourished.  Morbidly obese  HENT:  Head: Normocephalic and atraumatic.  Cardiovascular: Normal rate.   Pulmonary/Chest: Effort normal. No respiratory distress. He has no wheezes. He has no rales.  Abdominal: Soft.  Skin: Skin is warm and dry.  Left leg with color change consistent with chronic venous insufficiency. Right leg with cellulitis covering the leg to the knee with no obvious wound. Hot to the touch and sore diffusely. No calf tenderness and leg is similar size to the left.    Filed Vitals:   05/06/15 1559  BP: 122/74  Pulse: 79  Temp: 97.9 F (36.6 C)  TempSrc: Oral  Resp: 18  Weight: 351 lb 6.4 oz (159.394 kg)  SpO2: 91%      Assessment & Plan:

## 2015-05-06 NOTE — Assessment & Plan Note (Signed)
Right leg, new. Rx for bactrim DS to start tonight. If no improvement after dose tonight and tomorrow morning will ask that he seek care in the ED given his concurrent diabetes and foot wound he is high risk for sepsis. No signs of sepsis today including no abnormal vital signs, no fevers or chills. If he is improving have asked that he come back for recheck with the office on Friday.

## 2015-05-06 NOTE — Progress Notes (Signed)
Pre visit review using our clinic review tool, if applicable. No additional management support is needed unless otherwise documented below in the visit note. 

## 2015-05-07 ENCOUNTER — Inpatient Hospital Stay (HOSPITAL_COMMUNITY)
Admission: EM | Admit: 2015-05-07 | Discharge: 2015-05-12 | DRG: 603 | Disposition: A | Discharge status: Home / Self Care | Payer: Medicare Other | Attending: Internal Medicine | Admitting: Internal Medicine

## 2015-05-07 ENCOUNTER — Encounter (HOSPITAL_COMMUNITY): Payer: Self-pay | Admitting: Emergency Medicine

## 2015-05-07 DIAGNOSIS — G4733 Obstructive sleep apnea (adult) (pediatric): Secondary | ICD-10-CM | POA: Diagnosis present

## 2015-05-07 DIAGNOSIS — Z89429 Acquired absence of other toe(s), unspecified side: Secondary | ICD-10-CM

## 2015-05-07 DIAGNOSIS — I252 Old myocardial infarction: Secondary | ICD-10-CM | POA: Diagnosis not present

## 2015-05-07 DIAGNOSIS — Z87891 Personal history of nicotine dependence: Secondary | ICD-10-CM | POA: Diagnosis not present

## 2015-05-07 DIAGNOSIS — E1140 Type 2 diabetes mellitus with diabetic neuropathy, unspecified: Secondary | ICD-10-CM | POA: Diagnosis present

## 2015-05-07 DIAGNOSIS — Z888 Allergy status to other drugs, medicaments and biological substances status: Secondary | ICD-10-CM

## 2015-05-07 DIAGNOSIS — H9190 Unspecified hearing loss, unspecified ear: Secondary | ICD-10-CM | POA: Diagnosis present

## 2015-05-07 DIAGNOSIS — J439 Emphysema, unspecified: Secondary | ICD-10-CM | POA: Diagnosis not present

## 2015-05-07 DIAGNOSIS — L03115 Cellulitis of right lower limb: Principal | ICD-10-CM

## 2015-05-07 DIAGNOSIS — I10 Essential (primary) hypertension: Secondary | ICD-10-CM | POA: Diagnosis present

## 2015-05-07 DIAGNOSIS — Z794 Long term (current) use of insulin: Secondary | ICD-10-CM

## 2015-05-07 DIAGNOSIS — L03119 Cellulitis of unspecified part of limb: Secondary | ICD-10-CM

## 2015-05-07 DIAGNOSIS — Z8673 Personal history of transient ischemic attack (TIA), and cerebral infarction without residual deficits: Secondary | ICD-10-CM

## 2015-05-07 DIAGNOSIS — E11319 Type 2 diabetes mellitus with unspecified diabetic retinopathy without macular edema: Secondary | ICD-10-CM | POA: Diagnosis present

## 2015-05-07 DIAGNOSIS — I2510 Atherosclerotic heart disease of native coronary artery without angina pectoris: Secondary | ICD-10-CM | POA: Diagnosis not present

## 2015-05-07 DIAGNOSIS — I480 Paroxysmal atrial fibrillation: Secondary | ICD-10-CM | POA: Diagnosis not present

## 2015-05-07 DIAGNOSIS — IMO0002 Reserved for concepts with insufficient information to code with codable children: Secondary | ICD-10-CM | POA: Diagnosis present

## 2015-05-07 DIAGNOSIS — J449 Chronic obstructive pulmonary disease, unspecified: Secondary | ICD-10-CM | POA: Diagnosis present

## 2015-05-07 DIAGNOSIS — E1165 Type 2 diabetes mellitus with hyperglycemia: Secondary | ICD-10-CM

## 2015-05-07 DIAGNOSIS — Z6841 Body Mass Index (BMI) 40.0 and over, adult: Secondary | ICD-10-CM

## 2015-05-07 DIAGNOSIS — E780 Pure hypercholesterolemia: Secondary | ICD-10-CM | POA: Diagnosis present

## 2015-05-07 DIAGNOSIS — E538 Deficiency of other specified B group vitamins: Secondary | ICD-10-CM | POA: Diagnosis present

## 2015-05-07 DIAGNOSIS — M79604 Pain in right leg: Secondary | ICD-10-CM | POA: Diagnosis not present

## 2015-05-07 DIAGNOSIS — L0390 Cellulitis, unspecified: Secondary | ICD-10-CM | POA: Diagnosis present

## 2015-05-07 DIAGNOSIS — E11628 Type 2 diabetes mellitus with other skin complications: Secondary | ICD-10-CM | POA: Insufficient documentation

## 2015-05-07 LAB — CBC WITH DIFFERENTIAL/PLATELET
BASOS PCT: 0 % (ref 0–1)
Basophils Absolute: 0 10*3/uL (ref 0.0–0.1)
EOS ABS: 0.1 10*3/uL (ref 0.0–0.7)
EOS PCT: 1 % (ref 0–5)
HCT: 35.4 % — ABNORMAL LOW (ref 39.0–52.0)
Hemoglobin: 11.1 g/dL — ABNORMAL LOW (ref 13.0–17.0)
LYMPHS ABS: 0.9 10*3/uL (ref 0.7–4.0)
Lymphocytes Relative: 11 % — ABNORMAL LOW (ref 12–46)
MCH: 29.9 pg (ref 26.0–34.0)
MCHC: 31.4 g/dL (ref 30.0–36.0)
MCV: 95.4 fL (ref 78.0–100.0)
Monocytes Absolute: 0.5 10*3/uL (ref 0.1–1.0)
Monocytes Relative: 6 % (ref 3–12)
NEUTROS ABS: 7 10*3/uL (ref 1.7–7.7)
Neutrophils Relative %: 82 % — ABNORMAL HIGH (ref 43–77)
Platelets: 286 10*3/uL (ref 150–400)
RBC: 3.71 MIL/uL — ABNORMAL LOW (ref 4.22–5.81)
RDW: 15.8 % — ABNORMAL HIGH (ref 11.5–15.5)
WBC: 8.6 10*3/uL (ref 4.0–10.5)

## 2015-05-07 LAB — COMPREHENSIVE METABOLIC PANEL
ALBUMIN: 3 g/dL — AB (ref 3.5–5.0)
ALT: 14 U/L — ABNORMAL LOW (ref 17–63)
AST: 17 U/L (ref 15–41)
Alkaline Phosphatase: 54 U/L (ref 38–126)
Anion gap: 8 (ref 5–15)
BUN: 19 mg/dL (ref 6–20)
CHLORIDE: 101 mmol/L (ref 101–111)
CO2: 28 mmol/L (ref 22–32)
Calcium: 8.7 mg/dL — ABNORMAL LOW (ref 8.9–10.3)
Creatinine, Ser: 0.89 mg/dL (ref 0.61–1.24)
GFR calc Af Amer: >60 mL/min (ref >60)
GFR calc non Af Amer: >60 mL/min (ref >60)
Glucose, Bld: 185 mg/dL — ABNORMAL HIGH (ref 65–99)
Potassium: 4.6 mmol/L (ref 3.5–5.1)
Sodium: 137 mmol/L (ref 135–145)
TOTAL PROTEIN: 6.7 g/dL (ref 6.5–8.1)
Total Bilirubin: 0.4 mg/dL (ref 0.3–1.2)

## 2015-05-07 LAB — GLUCOSE, CAPILLARY: Glucose-Capillary: 146 mg/dL — ABNORMAL HIGH (ref 65–99)

## 2015-05-07 LAB — I-STAT CG4 LACTIC ACID, ED: Lactic Acid, Venous: 0.95 mmol/L (ref 0.5–2.0)

## 2015-05-07 MED ORDER — VANCOMYCIN HCL IN DEXTROSE 1-5 GM/200ML-% IV SOLN: 1000.0000 mg | Freq: Once | INTRAVENOUS | Status: DC

## 2015-05-07 MED ORDER — GABAPENTIN 300 MG PO CAPS
300.0000 mg | ORAL_CAPSULE | Freq: Two times a day (BID) | ORAL | Status: DC
  Administered 2015-05-08 – 2015-05-12 (×9): 300 mg via ORAL
  Filled 2015-05-07 (×10): qty 1

## 2015-05-07 MED ORDER — SODIUM CHLORIDE 0.9 % IJ SOLN: 3.0000 mL | INTRAMUSCULAR | Status: DC | PRN

## 2015-05-07 MED ORDER — ACETAMINOPHEN 325 MG PO TABS
650.0000 mg | ORAL_TABLET | Freq: Four times a day (QID) | ORAL | Status: DC | PRN
  Administered 2015-05-08 (×3): 650 mg via ORAL
  Filled 2015-05-07 (×3): qty 2

## 2015-05-07 MED ORDER — ONDANSETRON HCL 4 MG PO TABS: 4.0000 mg | ORAL_TABLET | Freq: Four times a day (QID) | ORAL | Status: DC | PRN

## 2015-05-07 MED ORDER — HYDROMORPHONE HCL 1 MG/ML IJ SOLN: 0.5000 mg | INTRAMUSCULAR | Status: DC | PRN

## 2015-05-07 MED ORDER — PRAVASTATIN SODIUM 40 MG PO TABS
40.0000 mg | ORAL_TABLET | Freq: Every day | ORAL | Status: DC
  Administered 2015-05-08 – 2015-05-12 (×5): 40 mg via ORAL
  Filled 2015-05-07 (×5): qty 1

## 2015-05-07 MED ORDER — METOPROLOL TARTRATE 50 MG PO TABS
50.0000 mg | ORAL_TABLET | Freq: Two times a day (BID) | ORAL | Status: DC
  Administered 2015-05-07 – 2015-05-12 (×8): 50 mg via ORAL
  Filled 2015-05-07 (×11): qty 1

## 2015-05-07 MED ORDER — VITAMIN B-12 1000 MCG PO TABS
1000.0000 ug | ORAL_TABLET | Freq: Every day | ORAL | Status: DC
  Administered 2015-05-08 – 2015-05-12 (×5): 1000 ug via ORAL
  Filled 2015-05-07 (×5): qty 1

## 2015-05-07 MED ORDER — ONDANSETRON HCL 4 MG/2ML IJ SOLN: 4.0000 mg | Freq: Four times a day (QID) | INTRAMUSCULAR | Status: DC | PRN

## 2015-05-07 MED ORDER — FUROSEMIDE 40 MG PO TABS
40.0000 mg | ORAL_TABLET | Freq: Every day | ORAL | Status: DC
  Administered 2015-05-08 – 2015-05-12 (×5): 40 mg via ORAL
  Filled 2015-05-07 (×5): qty 1

## 2015-05-07 MED ORDER — MOMETASONE FURO-FORMOTEROL FUM 100-5 MCG/ACT IN AERO
2.0000 | INHALATION_SPRAY | Freq: Two times a day (BID) | RESPIRATORY_TRACT | Status: DC
  Filled 2015-05-07: qty 8.8

## 2015-05-07 MED ORDER — LOSARTAN POTASSIUM 50 MG PO TABS
100.0000 mg | ORAL_TABLET | Freq: Every day | ORAL | Status: DC
  Administered 2015-05-08 – 2015-05-12 (×5): 100 mg via ORAL
  Filled 2015-05-07 (×5): qty 2

## 2015-05-07 MED ORDER — POTASSIUM CHLORIDE CRYS ER 20 MEQ PO TBCR
40.0000 meq | EXTENDED_RELEASE_TABLET | Freq: Every day | ORAL | Status: DC
  Administered 2015-05-08 – 2015-05-12 (×5): 40 meq via ORAL
  Filled 2015-05-07 (×5): qty 2

## 2015-05-07 MED ORDER — CLOPIDOGREL BISULFATE 75 MG PO TABS
75.0000 mg | ORAL_TABLET | Freq: Every day | ORAL | Status: DC
  Administered 2015-05-08 – 2015-05-12 (×5): 75 mg via ORAL
  Filled 2015-05-07 (×5): qty 1

## 2015-05-07 MED ORDER — SODIUM CHLORIDE 0.9 % IJ SOLN
3.0000 mL | Freq: Two times a day (BID) | INTRAMUSCULAR | Status: DC
  Administered 2015-05-08 – 2015-05-11 (×3): 3 mL via INTRAVENOUS

## 2015-05-07 MED ORDER — PIPERACILLIN-TAZOBACTAM 3.375 G IVPB 30 MIN
3.3750 g | Freq: Once | INTRAVENOUS | Status: DC
  Administered 2015-05-07: 3.375 g via INTRAVENOUS
  Filled 2015-05-07: qty 50

## 2015-05-07 MED ORDER — HYDROCODONE-ACETAMINOPHEN 5-325 MG PO TABS
1.0000 | ORAL_TABLET | ORAL | Status: DC | PRN
  Administered 2015-05-08 (×2): 1 via ORAL
  Administered 2015-05-09 – 2015-05-12 (×14): 2 via ORAL
  Filled 2015-05-07 (×15): qty 2

## 2015-05-07 MED ORDER — PIPERACILLIN-TAZOBACTAM 3.375 G IVPB
3.3750 g | Freq: Three times a day (TID) | INTRAVENOUS | Status: DC
  Administered 2015-05-08 – 2015-05-12 (×13): 3.375 g via INTRAVENOUS
  Filled 2015-05-07 (×16): qty 50

## 2015-05-07 MED ORDER — SODIUM CHLORIDE 0.9 % IV SOLN: 250.0000 mL | INTRAVENOUS | Status: DC | PRN

## 2015-05-07 MED ORDER — INSULIN ASPART 100 UNIT/ML ~~LOC~~ SOLN
0.0000 [IU] | Freq: Three times a day (TID) | SUBCUTANEOUS | Status: DC
Start: 2015-05-08 — End: 2015-05-12
  Administered 2015-05-08 (×2): 2 [IU] via SUBCUTANEOUS
  Administered 2015-05-09: 1 [IU] via SUBCUTANEOUS
  Administered 2015-05-10 (×2): 2 [IU] via SUBCUTANEOUS
  Administered 2015-05-10: 1 [IU] via SUBCUTANEOUS
  Administered 2015-05-11 – 2015-05-12 (×3): 2 [IU] via SUBCUTANEOUS

## 2015-05-07 MED ORDER — ALUM & MAG HYDROXIDE-SIMETH 200-200-20 MG/5ML PO SUSP: 30.0000 mL | Freq: Four times a day (QID) | ORAL | Status: DC | PRN

## 2015-05-07 MED ORDER — ENOXAPARIN SODIUM 40 MG/0.4ML ~~LOC~~ SOLN
40.0000 mg | SUBCUTANEOUS | Status: DC
  Administered 2015-05-07 – 2015-05-08 (×2): 40 mg via SUBCUTANEOUS
  Filled 2015-05-07 (×3): qty 0.4

## 2015-05-07 MED ORDER — ALBUTEROL SULFATE (2.5 MG/3ML) 0.083% IN NEBU: 2.5000 mg | INHALATION_SOLUTION | Freq: Four times a day (QID) | RESPIRATORY_TRACT | Status: DC | PRN

## 2015-05-07 MED ORDER — GLIMEPIRIDE 4 MG PO TABS
4.0000 mg | ORAL_TABLET | Freq: Two times a day (BID) | ORAL | Status: DC
  Administered 2015-05-08 – 2015-05-12 (×9): 4 mg via ORAL
  Filled 2015-05-07 (×10): qty 1

## 2015-05-07 MED ORDER — SODIUM CHLORIDE 0.9 % IV SOLN
1250.0000 mg | Freq: Two times a day (BID) | INTRAVENOUS | Status: DC
  Administered 2015-05-07 – 2015-05-12 (×9): 1250 mg via INTRAVENOUS
  Filled 2015-05-07 (×11): qty 1250

## 2015-05-07 MED ORDER — PIOGLITAZONE HCL 45 MG PO TABS
45.0000 mg | ORAL_TABLET | Freq: Every day | ORAL | Status: DC
  Administered 2015-05-08 – 2015-05-12 (×5): 45 mg via ORAL
  Filled 2015-05-07 (×5): qty 1

## 2015-05-07 NOTE — Progress Notes (Signed)
ANTIBIOTIC CONSULT NOTE - INITIAL  Pharmacy Consult for Vancomycin and Zosyn Indication: Right LE cellulitis  Allergies  Allergen Reactions  . Xarelto [Rivaroxaban]     rash    Patient Measurements: Weight: (!) 351 lb (159.213 kg)  Vital Signs: Temp: 98.1 F (36.7 C) (05/25 1528) Temp Source: Oral (05/25 1528) BP: 149/50 mmHg (05/25 1817) Pulse Rate: 84 (05/25 1817) Intake/Output from previous day:   Intake/Output from this shift:    Labs:  Recent Labs  05/07/15 1551  WBC 8.6  HGB 11.1*  PLT 286  CREATININE 0.89   Estimated Creatinine Clearance: 121.1 mL/min (by C-G formula based on Cr of 0.89). No results for input(s): VANCOTROUGH, VANCOPEAK, VANCORANDOM, GENTTROUGH, GENTPEAK, GENTRANDOM, TOBRATROUGH, TOBRAPEAK, TOBRARND, AMIKACINPEAK, AMIKACINTROU, AMIKACIN in the last 72 hours.   Microbiology: No results found for this or any previous visit (from the past 720 hour(s)).  Medical History: Past Medical History  Diagnosis Date  . CAD (coronary artery disease)     a. Approx. 2000 - MI. Cath showed single vessel disease, PTCA dLAD/medical management. ;  b. NSTEMI 11/13 => LHC: prox and mid LAD 30%, dLAD 60%, pCFX 30%, inf branch of OM 40%, mRCA 50-60%, EF 55-60%; IVUS attempted for RCA but not successful; anatomy felt stable from 2000 => med Rx.  . Diverticulitis   . Hypertension   . Morbid obesity   . CVA (cerebral infarction)     a. Approx. 2002  . Cholelithiasis   . Hypercholesterolemia   . ED (erectile dysfunction)   . OSA on CPAP   . Type II diabetes mellitus   . History of stomach ulcers   . History of MRSA infection     "little toe right foot" (10/26/2012)  . Tunnel vision     "both eyes since stroke"  . Claustrophobia   . B12 deficiency   . Diabetic neuropathy   . PAF (paroxysmal atrial fibrillation)     a. confirmed by event monitor. b. 02/2014 rash on Coumadin, patient decided to discontinue Xarelto due to possible rash, cost and lawyers ads on  TV, agreed to take Plavix.    Medications:  Scheduled:   Infusions:  . piperacillin-tazobactam    . vancomycin     Assessment: 74 yo M presenting to ED on 05/07/2015 with worsening cellulitis of right LE. Pharmacy has been consulted to dose Vancomycin and Zosyn. Tmax 98.1, WBC wnl, SCr 0.89 (normalized CrCl ~ 75 ml/min). Blood and urine cultures sent.   Goal of Therapy:  Vancomycin trough level 15-20 mcg/ml  Plan:  - Initiate vancomycin 1250 mg IV q12h - Initiate Zosyn 3.375 gm IV q8h (4 hour infusion) - Monitor renal function, temp, WBC, C&S, VT at [place]. Velva Harman, PharmD, [place] Clinical Pharmacist - Resident Pager: [phone] Pharmacy: [phone] 05/07/2015 8:57 PM

## 2015-05-07 NOTE — ED Notes (Addendum)
Pt here with worsening cellulitis of right leg; redness noted; pt taking antibiotics without effect

## 2015-05-07 NOTE — ED Notes (Signed)
Report attempted 

## 2015-05-07 NOTE — H&P (Signed)
PCP:   [name], MD   Chief Complaint:  Right leg red  HPI: 74 yo male h/o iddm, CAD, MO, htn, afib has been on doxycyline for a sore on his right foot for last 2 weeks.  The last 2 days the leg has gotten red and swollen and painful.  No fevers at home.  He went to see his pcp yesterday who put him on bactrim, he has taken 3 doses of bactrim and the leg is getting worse.  More red anterior.  The foot is not so red.  He has not been eating as well and not feeling too good.  No n/v/d.  He is not anticoagulated as he refuses due to cost and inconvenience of coumadin.    Review of Systems:  Positive and negative as per HPI otherwise all other systems are negative  Past Medical History: Past Medical History  Diagnosis Date  . CAD (coronary artery disease)     a. Approx. 2000 - MI. Cath showed single vessel disease, PTCA dLAD/medical management. ;  b. NSTEMI 11/13 => LHC: prox and mid LAD 30%, dLAD 60%, pCFX 30%, inf branch of OM 40%, mRCA 50-60%, EF 55-60%; IVUS attempted for RCA but not successful; anatomy felt stable from 2000 => med Rx.  . Diverticulitis   . Hypertension   . Morbid obesity   . CVA (cerebral infarction)     a. Approx. 2002  . Cholelithiasis   . Hypercholesterolemia   . ED (erectile dysfunction)   . OSA on CPAP   . Type II diabetes mellitus   . History of stomach ulcers   . History of MRSA infection     "little toe right foot" (10/26/2012)  . Tunnel vision     "both eyes since stroke"  . Claustrophobia   . B12 deficiency   . Diabetic neuropathy   . PAF (paroxysmal atrial fibrillation)     a. confirmed by event monitor. b. 02/2014 rash on Coumadin, patient decided to discontinue Xarelto due to possible rash, cost and lawyers ads on TV, agreed to take Plavix.   Past Surgical History  Procedure Laterality Date  . Toe amputation  2006; 2009    "Dr. Blenda Mounts; big toe left foot; little toe on right foot" (10/26/2012)  . Cardiac catheterization  1990's  .  Cerebral angiogram  ~ 2000  . Left heart catheterization with coronary angiogram N/A 10/27/2012    Procedure: LEFT HEART CATHETERIZATION WITH CORONARY ANGIOGRAM;  Surgeon: Burnell Blanks, MD;  Location: [facility] CATH LAB;  Service: Cardiovascular;  Laterality: N/A;    Medications: Prior to Admission medications   Medication Sig Start Date End Date Taking? Authorizing Provider  acetaminophen (TYLENOL) 325 MG tablet Take 2 tablets (650 mg total) by mouth every 6 (six) hours as needed for mild pain, fever or headache (or Fever >/= 101). 08/06/14   Modena Jansky, MD  albuterol (PROVENTIL HFA) 108 (90 BASE) MCG/ACT inhaler Inhale 2 puffs into the lungs every 6 (six) hours as needed for wheezing or shortness of breath. Patient not taking: Reported on 05/06/2015 12/17/14   Cassandria Anger, MD  Cholecalciferol (EQL VITAMIN D3) 1000 UNITS tablet Take 1,000 Units by mouth daily.      Historical Provider, MD  clopidogrel (PLAVIX) 75 MG tablet Take 1 tablet (75 mg total) by mouth daily. 08/27/14   Aleksei Plotnikov V, MD  doxycycline (VIBRA-TABS) 100 MG tablet Take 1 tablet (100 mg total) by mouth 2 (two) times daily. 03/31/15  Tamala Fothergill Regal, DPM  Fluticasone-Salmeterol (ADVAIR DISKUS) 250-50 MCG/DOSE AEPB Inhale 1 puff into the lungs 2 (two) times daily. Patient not taking: Reported on 05/06/2015 12/17/14   Tyrone Apple Plotnikov V, MD  furosemide (LASIX) 80 MG tablet Take 0.5 tablets (40 mg total) by mouth daily. 08/06/14   Modena Jansky, MD  gabapentin (NEURONTIN) 300 MG capsule Take 300 mg by mouth 2 (two) times daily.    Historical Provider, MD  glimepiride (AMARYL) 4 MG tablet Take 1 tablet (4 mg total) by mouth 2 (two) times daily. 05/15/14   Aleksei Plotnikov V, MD  insulin lispro (HUMALOG KWIKPEN) 100 UNIT/ML KiwkPen Inject 5-20 Units into the skin 4 (four) times daily - after meals and at bedtime. Per sliding scale    Historical Provider, MD  losartan (COZAAR) 100 MG tablet Take 1 tablet (100 mg  total) by mouth daily. 05/15/14 05/15/15  Aleksei Plotnikov V, MD  metoprolol (LOPRESSOR) 50 MG tablet Take 1 tablet (50 mg total) by mouth 2 (two) times daily. 05/15/14   Aleksei Plotnikov V, MD  nitroGLYCERIN (NITROSTAT) 0.4 MG SL tablet Place 0.4 mg under the tongue every 5 (five) minutes as needed. For chest pain 06/07/11   Lew Dawes V, MD  pioglitazone (ACTOS) 45 MG tablet Take 45 mg by mouth daily.  12/26/14   Historical Provider, MD  potassium chloride SA (K-DUR,KLOR-CON) 20 MEQ tablet Take 2 tablets (40 mEq total) by mouth daily. 09/16/14   Aleksei Plotnikov V, MD  pravastatin (PRAVACHOL) 40 MG tablet Take 1 tablet (40 mg total) by mouth daily. 05/15/14   Aleksei Plotnikov V, MD  silver sulfADIAZINE (SILVADENE) 1 % cream Apply 1 application topically daily. 08/27/14   Harriet Masson, DPM  Spacer/Aero-Holding Chambers (BREATHERITE COLL SPACER ADULT) MISC Use w/proventil MDI Patient not taking: Reported on 05/06/2015 12/17/14   Cassandria Anger, MD  sulfamethoxazole-trimethoprim (BACTRIM DS,SEPTRA DS) 800-160 MG per tablet Take 1 tablet by mouth 2 (two) times daily. 05/06/15   Olga Millers, MD  triamcinolone cream (KENALOG) 0.5 % Apply 1 application topically 2 (two) times daily as needed (rash). 04/18/15   Aleksei Plotnikov V, MD  vitamin B-12 (CYANOCOBALAMIN) 1000 MCG tablet Take 1,000 mcg by mouth daily.      Historical Provider, MD    Allergies:   Allergies  Allergen Reactions  . Xarelto [Rivaroxaban]     rash    Social History:  reports that he has quit smoking. His smoking use included Cigarettes and Cigars. He has a 30 pack-year smoking history. His smokeless tobacco use includes Snuff. He reports that he drinks alcohol. He reports that he does not use illicit drugs.  Family History: Family History  Problem Relation Age of Onset  . Asthma Neg Hx   . Cancer Mother 14    breast  . Heart disease Father 44    heart    Physical Exam: Filed Vitals:   05/07/15 1528 05/07/15  1817 05/07/15 2025 05/07/15 2109  BP: 148/58 149/50 152/53   Pulse: 74 84 84   Temp: 98.1 F (36.7 C)     TempSrc: Oral     Resp: 20 16 16    Weight: 159.213 kg (351 lb)   159.666 kg (352 lb)  SpO2: 93% 100% 100%    General appearance: alert, cooperative and no distress Head: Normocephalic, without obvious abnormality, atraumatic Eyes: negative Nose: Nares normal. Septum midline. Mucosa normal. No drainage or sinus tenderness. Neck: no JVD and supple, symmetrical, trachea midline Lungs: clear to  auscultation bilaterally Heart: regular rate and rhythm, S1, S2 normal, no murmur, click, rub or gallop Abdomen: soft, non-tender; bowel sounds normal; no masses,  no organomegaly Extremities: extremities normal, atraumatic, no cyanosis or edema Pulses: 2+ and symmetric Skin: cellulitis to rle up to knee Neurologic: Grossly normal    Labs on Admission:   Recent Labs  05/07/15 1551  NA 137  K 4.6  CL 101  CO2 28  GLUCOSE 185*  BUN 19  CREATININE 0.89  CALCIUM 8.7*    Recent Labs  05/07/15 1551  AST 17  ALT 14*  ALKPHOS 54  BILITOT 0.4  PROT 6.7  ALBUMIN 3.0*    Recent Labs  05/07/15 1551  WBC 8.6  NEUTROABS 7.0  HGB 11.1*  HCT 35.4*  MCV 95.4  PLT 286   Radiological Exams on Admission: No results found.  Old records reviewed  Assessment/Plan  74 yo male with multiple medical problems with RLE cellulitis leg despite 2 weeks of doxycycline  Principal Problem:   Cellulitis-  Failed outpt doxy for last 2 weeks, started bactrim yesterday.  No fevers.  Place on iv vancomycin and zosyn.  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  Elevate leg.  If no significant improvement consider ultrasound of leg to r/o DVT, but appears like cellulitis.  Pulse intact.  Check lactic acid level, does not appear septic, vss.  Monitor closely for development of sepsis.  Active Problems:   Essential hypertension-  stable   Coronary atherosclerosis-   stable   Type 2 diabetes, uncontrolled, with retinopathy-  Place on ssi.     Morbid obesity- noted   PAF (paroxysmal atrial fibrillation)-  Not anticoagulated due to personal choice   COPD stable  Admit to medical bed.  FULL CODE.  Dawn Convery A 05/07/2015, 9:17 PM

## 2015-05-07 NOTE — Progress Notes (Signed)
Report received from Bryan, RN

## 2015-05-07 NOTE — ED Provider Notes (Signed)
CSN: [id number]     Arrival date & time 05/07/15  1510 History   First MD Initiated Contact with Patient 05/07/15 2009     Chief Complaint  Patient presents with  . Leg Swelling     (Consider location/radiation/quality/duration/timing/severity/associated sxs/prior Treatment) HPI   Charles [name] is a 74 y.o. male  with right lower extremity cellulitis worsening over the course of 3 weeks. Patient is been on 2 and half weeks of doxycycline, he saw his primary care physician who started him on Bactrim yesterday, considering patient's history of insulin dependent diabetes, prior osteomyelitis she advised him to return to the ED if symptoms aren't significantly improved. Patient has no systemic signs of infection. He denies fever, chills, nausea, vomiting, chest pain, cough, shortness of breath. He states that the pain is not really in the foot but more in the leg. No significant worsening of the erythema over the last days. He states his pain is moderate, exacerbated by movement and palpations, he's been taking Motrin and Tylenol at home with good relief.   Past Medical History  Diagnosis Date  . CAD (coronary artery disease)     a. Approx. 2000 - MI. Cath showed single vessel disease, PTCA dLAD/medical management. ;  b. NSTEMI 11/13 => LHC: prox and mid LAD 30%, dLAD 60%, pCFX 30%, inf branch of OM 40%, mRCA 50-60%, EF 55-60%; IVUS attempted for RCA but not successful; anatomy felt stable from 2000 => med Rx.  . Diverticulitis   . Hypertension   . Morbid obesity   . CVA (cerebral infarction)     a. Approx. 2002  . Cholelithiasis   . Hypercholesterolemia   . ED (erectile dysfunction)   . OSA on CPAP   . Type II diabetes mellitus   . History of stomach ulcers   . History of MRSA infection     "little toe right foot" (10/26/2012)  . Tunnel vision     "both eyes since stroke"  . Claustrophobia   . B12 deficiency   . Diabetic neuropathy   . PAF (paroxysmal atrial fibrillation)     a.  confirmed by event monitor. b. 02/2014 rash on Coumadin, patient decided to discontinue Xarelto due to possible rash, cost and lawyers ads on TV, agreed to take Plavix.   Past Surgical History  Procedure Laterality Date  . Toe amputation  2006; 2009    "Dr. Blenda Mounts; big toe left foot; little toe on right foot" (10/26/2012)  . Cardiac catheterization  1990's  . Cerebral angiogram  ~ 2000  . Left heart catheterization with coronary angiogram N/A 10/27/2012    Procedure: LEFT HEART CATHETERIZATION WITH CORONARY ANGIOGRAM;  Surgeon: Burnell Blanks, MD;  Location: [facility] CATH LAB;  Service: Cardiovascular;  Laterality: N/A;   Family History  Problem Relation Age of Onset  . Asthma Neg Hx   . Cancer Mother 24    breast  . Heart disease Father 73    heart   History  Substance Use Topics  . Smoking status: Former Smoker -- 1.00 packs/day for 30 years    Types: Cigarettes, Cigars  . Smokeless tobacco: Current User    Types: Snuff     Comment: 10/26/2012 "quit smoking cigarettes  in ~ 1996; wife smokes in the house"  . Alcohol Use: Yes     Comment: 10/26/2012 "have a drink maybe twice/yr"    Review of Systems  10 systems reviewed and found to be negative, except as noted in the HPI.  Allergies  Xarelto  Home Medications   Prior to Admission medications   Medication Sig Start Date End Date Taking? Authorizing Provider  acetaminophen (TYLENOL) 325 MG tablet Take 2 tablets (650 mg total) by mouth every 6 (six) hours as needed for mild pain, fever or headache (or Fever >/= 101). 08/06/14   Modena Jansky, MD  albuterol (PROVENTIL HFA) 108 (90 BASE) MCG/ACT inhaler Inhale 2 puffs into the lungs every 6 (six) hours as needed for wheezing or shortness of breath. Patient not taking: Reported on 05/06/2015 12/17/14   Cassandria Anger, MD  Cholecalciferol (EQL VITAMIN D3) 1000 UNITS tablet Take 1,000 Units by mouth daily.      Historical Provider, MD  clopidogrel (PLAVIX) 75 MG tablet  Take 1 tablet (75 mg total) by mouth daily. 08/27/14   Aleksei Plotnikov V, MD  doxycycline (VIBRA-TABS) 100 MG tablet Take 1 tablet (100 mg total) by mouth 2 (two) times daily. 03/31/15   Wallene Huh, DPM  Fluticasone-Salmeterol (ADVAIR DISKUS) 250-50 MCG/DOSE AEPB Inhale 1 puff into the lungs 2 (two) times daily. Patient not taking: Reported on 05/06/2015 12/17/14   Tyrone Apple Plotnikov V, MD  furosemide (LASIX) 80 MG tablet Take 0.5 tablets (40 mg total) by mouth daily. 08/06/14   Modena Jansky, MD  gabapentin (NEURONTIN) 300 MG capsule Take 300 mg by mouth 2 (two) times daily.    Historical Provider, MD  glimepiride (AMARYL) 4 MG tablet Take 1 tablet (4 mg total) by mouth 2 (two) times daily. 05/15/14   Aleksei Plotnikov V, MD  insulin lispro (HUMALOG KWIKPEN) 100 UNIT/ML KiwkPen Inject 5-20 Units into the skin 4 (four) times daily - after meals and at bedtime. Per sliding scale    Historical Provider, MD  losartan (COZAAR) 100 MG tablet Take 1 tablet (100 mg total) by mouth daily. 05/15/14 05/15/15  Aleksei Plotnikov V, MD  metoprolol (LOPRESSOR) 50 MG tablet Take 1 tablet (50 mg total) by mouth 2 (two) times daily. 05/15/14   Aleksei Plotnikov V, MD  nitroGLYCERIN (NITROSTAT) 0.4 MG SL tablet Place 0.4 mg under the tongue every 5 (five) minutes as needed. For chest pain 06/07/11   Lew Dawes V, MD  pioglitazone (ACTOS) 45 MG tablet Take 45 mg by mouth daily.  12/26/14   Historical Provider, MD  potassium chloride SA (K-DUR,KLOR-CON) 20 MEQ tablet Take 2 tablets (40 mEq total) by mouth daily. 09/16/14   Aleksei Plotnikov V, MD  pravastatin (PRAVACHOL) 40 MG tablet Take 1 tablet (40 mg total) by mouth daily. 05/15/14   Aleksei Plotnikov V, MD  silver sulfADIAZINE (SILVADENE) 1 % cream Apply 1 application topically daily. 08/27/14   Harriet Masson, DPM  Spacer/Aero-Holding Chambers (BREATHERITE COLL SPACER ADULT) MISC Use w/proventil MDI Patient not taking: Reported on 05/06/2015 12/17/14   Cassandria Anger, MD  sulfamethoxazole-trimethoprim (BACTRIM DS,SEPTRA DS) 800-160 MG per tablet Take 1 tablet by mouth 2 (two) times daily. 05/06/15   Olga Millers, MD  triamcinolone cream (KENALOG) 0.5 % Apply 1 application topically 2 (two) times daily as needed (rash). 04/18/15   Aleksei Plotnikov V, MD  vitamin B-12 (CYANOCOBALAMIN) 1000 MCG tablet Take 1,000 mcg by mouth daily.      Historical Provider, MD   BP 152/53 mmHg  Pulse 84  Temp(Src) 98.1 F (36.7 C) (Oral)  Resp 16  Wt 352 lb (159.666 kg)  SpO2 100% Physical Exam  Constitutional: He is oriented to person, place, and time. He appears well-developed and well-nourished. No  distress.  HENT:  Head: Normocephalic.  Mouth/Throat: Oropharynx is clear and moist.  Eyes: Conjunctivae and EOM are normal. Pupils are equal, round, and reactive to light.  Neck: Normal range of motion.  Cardiovascular: Normal rate, regular rhythm and intact distal pulses.   Pulmonary/Chest: Effort normal and breath sounds normal. No stridor. No respiratory distress. He has no wheezes. He has no rales. He exhibits no tenderness.  Abdominal: Soft. Bowel sounds are normal. He exhibits no distension and no mass. There is no tenderness. There is no rebound and no guarding.  Musculoskeletal: Normal range of motion. He exhibits edema and tenderness.  Right lower extremity with erythema, warmth, pitting edema. There is slight vesicular lesions on the mid shin but there is no open wounds, this appears to be centered on the lower leg but is not affecting the toes. Distally neurovascularly intact.  Neurological: He is alert and oriented to person, place, and time.  Psychiatric: He has a normal mood and affect.  Nursing note and vitals reviewed.           ED Course  Procedures (including critical care time) Labs Review Labs Reviewed  CBC WITH DIFFERENTIAL/PLATELET - Abnormal; Notable for the following:    RBC 3.71 (*)    Hemoglobin 11.1 (*)    HCT 35.4 (*)     RDW 15.8 (*)    Neutrophils Relative % 82 (*)    Lymphocytes Relative 11 (*)    All other components within normal limits  COMPREHENSIVE METABOLIC PANEL - Abnormal; Notable for the following:    Glucose, Bld 185 (*)    Calcium 8.7 (*)    Albumin 3.0 (*)    ALT 14 (*)    All other components within normal limits  CULTURE, BLOOD (ROUTINE X 2)  CULTURE, BLOOD (ROUTINE X 2)  URINE CULTURE  URINALYSIS, ROUTINE W REFLEX MICROSCOPIC (NOT AT [facility])  I-STAT CG4 LACTIC ACID, ED    Imaging Review No results found.   EKG Interpretation None      MDM   Final diagnoses:  Cellulitis of right lower extremity without foot    Filed Vitals:   05/07/15 1528 05/07/15 1817 05/07/15 2025 05/07/15 2109  BP: 148/58 149/50 152/53   Pulse: 74 84 84   Temp: 98.1 F (36.7 C)     TempSrc: Oral     Resp: 20 16 16    Weight: 351 lb (159.213 kg)   352 lb (159.666 kg)  SpO2: 93% 100% 100%     Medications  piperacillin-tazobactam (ZOSYN) IVPB 3.375 g (not administered)  vancomycin (VANCOCIN) 1,250 mg in sodium chloride 0.9 % 250 mL IVPB (not administered)  piperacillin-tazobactam (ZOSYN) IVPB 3.375 g (not administered)    Charles [name] is a pleasant 74 y.o. male presenting with right lower extremity cellulitis nonresponsive to outpatient treatments of doxycycline for 2-1/2 weeks and Bactrim started yesterday. Patient has no systemic signs of infection. Blood cultures are drawn and patient started on vancomycin and Zosyn.  Discussed with [facility] hospitalist Dr. Shanon Brow who accepts admission.     Monico Blitz, PA-C 05/07/15 2124  Ernestina Patches, MD 05/08/15 [phone]

## 2015-05-07 NOTE — Progress Notes (Signed)
Report attempted, no answer.  

## 2015-05-08 ENCOUNTER — Encounter (HOSPITAL_COMMUNITY): Payer: Self-pay | Admitting: General Practice

## 2015-05-08 DIAGNOSIS — I2510 Atherosclerotic heart disease of native coronary artery without angina pectoris: Secondary | ICD-10-CM

## 2015-05-08 DIAGNOSIS — E11319 Type 2 diabetes mellitus with unspecified diabetic retinopathy without macular edema: Secondary | ICD-10-CM

## 2015-05-08 DIAGNOSIS — J439 Emphysema, unspecified: Secondary | ICD-10-CM

## 2015-05-08 DIAGNOSIS — I480 Paroxysmal atrial fibrillation: Secondary | ICD-10-CM

## 2015-05-08 DIAGNOSIS — L03115 Cellulitis of right lower limb: Principal | ICD-10-CM

## 2015-05-08 DIAGNOSIS — E1165 Type 2 diabetes mellitus with hyperglycemia: Secondary | ICD-10-CM

## 2015-05-08 LAB — CBC
HCT: 32.1 % — ABNORMAL LOW (ref 39.0–52.0)
Hemoglobin: 10.1 g/dL — ABNORMAL LOW (ref 13.0–17.0)
MCH: 29.9 pg (ref 26.0–34.0)
MCHC: 31.5 g/dL (ref 30.0–36.0)
MCV: 95 fL (ref 78.0–100.0)
PLATELETS: 281 10*3/uL (ref 150–400)
RBC: 3.38 MIL/uL — AB (ref 4.22–5.81)
RDW: 15.7 % — ABNORMAL HIGH (ref 11.5–15.5)
WBC: 5.8 10*3/uL (ref 4.0–10.5)

## 2015-05-08 LAB — BASIC METABOLIC PANEL
Anion gap: 6 (ref 5–15)
BUN: 18 mg/dL (ref 6–20)
CALCIUM: 8.5 mg/dL — AB (ref 8.9–10.3)
CHLORIDE: 102 mmol/L (ref 101–111)
CO2: 30 mmol/L (ref 22–32)
Creatinine, Ser: 0.9 mg/dL (ref 0.61–1.24)
GFR calc non Af Amer: >60 mL/min (ref >60)
GLUCOSE: 119 mg/dL — AB (ref 65–99)
Potassium: 3.8 mmol/L (ref 3.5–5.1)
SODIUM: 138 mmol/L (ref 135–145)

## 2015-05-08 LAB — GLUCOSE, CAPILLARY
GLUCOSE-CAPILLARY: 176 mg/dL — AB (ref 65–99)
GLUCOSE-CAPILLARY: 162 mg/dL — AB (ref 65–99)
Glucose-Capillary: 108 mg/dL — ABNORMAL HIGH (ref 65–99)
Glucose-Capillary: 140 mg/dL — ABNORMAL HIGH (ref 65–99)

## 2015-05-08 NOTE — Progress Notes (Signed)
Placed pt. On cpap. Pt. Tolerating well at this time. 

## 2015-05-08 NOTE — Progress Notes (Signed)
[facility] PROGRESS NOTE   Charles [name] VQQ:[id number] DOB: 24-Dec-1940 DOA: 05/07/2015 PCP: [name], MD  HPI/Subjective: Feels okay, denies any fever or chills. Feels the redness, soreness and pain is improving since yesterday.  Assessment/Plan: Principal Problem:   Cellulitis Active Problems:   Essential hypertension   Coronary atherosclerosis   Type 2 diabetes, uncontrolled, with retinopathy   Morbid obesity   PAF (paroxysmal atrial fibrillation)   COPD     Right lower extremity cellulitis Patient has right lower cellulitis for the past 2 weeks. Been treated with doxycycline, he reported it was for cut around his great toe. Recently started on Bactrim so he was taking both doxycycline and Bactrim but the redness was not improving. Admitted to the hospital started on Zosyn and vancomycin, improving now. See the photo with the physical exam  Diabetes mellitus type 2 Appears to be controlled diabetes hemoglobin A1c was 6.3 on 04/18/15. On Amaryl, Actos and lispro sliding scale at home, hold all home hypoglycemic agents. Started on carbohydrate modified diet and insulin sliding scale.  Paroxysmal atrial fibrillation Rate is controlled with metoprolol. Patient is not on anticoagulation secondary to personal preference. This patients CHA2DS2-VASc Score and unadjusted Ischemic Stroke Rate (% per year) is equal to 4.8 % stroke rate/year from a score of 4 for HTN, DM, MI and age of 61. Above score calculated as 1 point each if present [CHF, HTN, DM, Vascular=MI/PAD/Aortic Plaque, Age if 65-74, or Male] Above score calculated as 2 points each if present [Age > 75, or Stroke/TIA/TE]  COPD Stable, no evidence of decompensation.   Code Status: Full Code Family Communication: Plan discussed with the patient. Disposition Plan: Remains inpatient Diet: Diet heart healthy/carb modified Room service appropriate?: Yes; Fluid consistency::  Thin  Consultants:  None  Procedures:  None  Antibiotics:  Zosyn and vancomycin   Objective: Filed Vitals:   05/08/15 1147  BP: 118/66  Pulse:   Temp:   Resp:     Intake/Output Summary (Last 24 hours) at 05/08/15 1322 Last data filed at 05/08/15 0911  Gross per 24 hour  Intake      0 ml  Output    100 ml  Net   -100 ml   Filed Weights   05/07/15 2109 05/07/15 2244 05/08/15 0527  Weight: 159.666 kg (352 lb) 157.4 kg (347 lb 0.1 oz) 154.9 kg (341 lb 7.9 oz)    Exam: General: Alert and awake, oriented x3, not in any acute distress. HEENT: anicteric sclera, pupils reactive to light and accommodation, EOMI CVS: S1-S2 clear, no murmur rubs or gallops Chest: clear to auscultation bilaterally, no wheezing, rales or rhonchi Abdomen: soft nontender, nondistended, normal bowel sounds, no organomegaly Extremities: no cyanosis, clubbing or edema noted bilaterally Neuro: Cranial nerves II-XII intact, no focal neurological deficits       Data Reviewed: Basic Metabolic Panel:  Recent Labs Lab 05/07/15 1551 05/08/15 0509  NA 137 138  K 4.6 3.8  CL 101 102  CO2 28 30  GLUCOSE 185* 119*  BUN 19 18  CREATININE 0.89 0.90  CALCIUM 8.7* 8.5*   Liver Function Tests:  Recent Labs Lab 05/07/15 1551  AST 17  ALT 14*  ALKPHOS 54  BILITOT 0.4  PROT 6.7  ALBUMIN 3.0*   No results for input(s): LIPASE, AMYLASE in the last 168 hours. No results for input(s): AMMONIA in the last 168 hours. CBC:  Recent Labs Lab 05/07/15 1551 05/08/15 0509  WBC 8.6 5.8  NEUTROABS 7.0  --  HGB 11.1* 10.1*  HCT 35.4* 32.1*  MCV 95.4 95.0  PLT 286 281   Cardiac Enzymes: No results for input(s): CKTOTAL, CKMB, CKMBINDEX, TROPONINI in the last 168 hours. BNP (last 3 results) No results for input(s): BNP in the last 8760 hours.  ProBNP (last 3 results)  Recent Labs  07/25/14 1209 09/12/14 1136  PROBNP 1647.0* 132.0*    CBG:  Recent Labs Lab 05/07/15 2248  05/08/15 0823  GLUCAP 146* 108*    Micro No results found for this or any previous visit (from the past 240 hour(s)).   Studies: No results found.  Scheduled Meds: . clopidogrel  75 mg Oral Daily  . enoxaparin (LOVENOX) injection  40 mg Subcutaneous Q24H  . furosemide  40 mg Oral Daily  . gabapentin  300 mg Oral BID  . glimepiride  4 mg Oral BID  . insulin aspart  0-9 Units Subcutaneous TID WC  . losartan  100 mg Oral Daily  . metoprolol  50 mg Oral BID  . mometasone-formoterol  2 puff Inhalation BID  . pioglitazone  45 mg Oral Daily  . piperacillin-tazobactam (ZOSYN)  IV  3.375 g Intravenous 3 times per day  . potassium chloride SA  40 mEq Oral Daily  . pravastatin  40 mg Oral Daily  . sodium chloride  3 mL Intravenous Q12H  . vancomycin  1,250 mg Intravenous Q12H  . vitamin B-12  1,000 mcg Oral Daily   Continuous Infusions:      Time spent: 35 minutes    [facility] A  [facility] Pager [phone] If 7PM-7AM, please contact night-coverage at [email or web address], password [facility] 05/08/2015, 1:22 PM  LOS: 1 day

## 2015-05-08 NOTE — Progress Notes (Signed)
Utilization review completed.  

## 2015-05-09 ENCOUNTER — Ambulatory Visit: Payer: Medicare Other | Admitting: Internal Medicine

## 2015-05-09 DIAGNOSIS — I10 Essential (primary) hypertension: Secondary | ICD-10-CM

## 2015-05-09 LAB — URINE CULTURE
COLONY COUNT: NO GROWTH
Culture: NO GROWTH

## 2015-05-09 LAB — GLUCOSE, CAPILLARY
Glucose-Capillary: 93 mg/dL (ref 65–99)
Glucose-Capillary: 142 mg/dL — ABNORMAL HIGH (ref 65–99)
Glucose-Capillary: 116 mg/dL — ABNORMAL HIGH (ref 65–99)
Glucose-Capillary: 164 mg/dL — ABNORMAL HIGH (ref 65–99)

## 2015-05-09 MED ORDER — NYSTATIN 100000 UNIT/GM EX POWD
Freq: Three times a day (TID) | CUTANEOUS | Status: DC
  Administered 2015-05-09 – 2015-05-11 (×4): via TOPICAL
  Filled 2015-05-09: qty 15

## 2015-05-09 MED ORDER — POTASSIUM CHLORIDE CRYS ER 20 MEQ PO TBCR
40.0000 meq | EXTENDED_RELEASE_TABLET | Freq: Once | ORAL | Status: AC
  Administered 2015-05-09: 40 meq via ORAL
  Filled 2015-05-09: qty 2

## 2015-05-09 MED ORDER — FUROSEMIDE 10 MG/ML IJ SOLN
40.0000 mg | Freq: Once | INTRAMUSCULAR | Status: AC
  Administered 2015-05-09: 40 mg via INTRAVENOUS
  Filled 2015-05-09: qty 4

## 2015-05-09 NOTE — Progress Notes (Signed)
Pt states he places himself on CPAP when ready. RT instructed pt to call if he needed any assistance.

## 2015-05-09 NOTE — Progress Notes (Signed)
[facility] PROGRESS NOTE   ILIR MAHRT [id number] DOB: Feb 07, 1941 DOA: 05/07/2015 PCP: [name], MD  HPI/Subjective: Slightly improving, continue current antibodies. Patient is on Zosyn and vancomycin, redness, pain and swelling improving.  Assessment/Plan: Principal Problem:   Cellulitis Active Problems:   Essential hypertension   Coronary atherosclerosis   Type 2 diabetes, uncontrolled, with retinopathy   Morbid obesity   PAF (paroxysmal atrial fibrillation)   COPD     Right lower extremity cellulitis Patient has right lower cellulitis for the past 2 weeks. Been treated with doxycycline, he reported it was for cut around his great toe. Recently started on Bactrim so he was taking both doxycycline and Bactrim but the redness was not improving. Admitted to the hospital started on Zosyn and vancomycin, improving now. Continue current management. Elevate leg, extra dose of IV Lasix.  Diabetes mellitus type 2 Appears to be controlled diabetes hemoglobin A1c was 6.3 on 04/18/15. On Amaryl, Actos and lispro sliding scale at home, hold all home hypoglycemic agents. Started on carbohydrate modified diet and insulin sliding scale.  Paroxysmal atrial fibrillation Rate is controlled with metoprolol. Patient is not on anticoagulation secondary to personal preference. This patients CHA2DS2-VASc Score and unadjusted Ischemic Stroke Rate (% per year) is equal to 4.8 % stroke rate/year from a score of 4 for HTN, DM, MI and age of 6. Above score calculated as 1 point each if present [CHF, HTN, DM, Vascular=MI/PAD/Aortic Plaque, Age if 65-74, or Male] Above score calculated as 2 points each if present [Age > 75, or Stroke/TIA/TE]  COPD Stable, no evidence of decompensation.   Code Status: Full Code Family Communication: Plan discussed with the patient. Disposition Plan: Remains inpatient Diet: Diet heart healthy/carb modified Room service appropriate?: Yes;  Fluid consistency:: Thin  Consultants:  None  Procedures:  None  Antibiotics:  Zosyn and vancomycin   Objective: Filed Vitals:   05/09/15 0537  BP: 99/60  Pulse: 59  Temp: 98.4 F (36.9 C)  Resp: 20    Intake/Output Summary (Last 24 hours) at 05/09/15 1316 Last data filed at 05/09/15 1209  Gross per 24 hour  Intake    530 ml  Output   2980 ml  Net  -2450 ml   Filed Weights   05/07/15 2244 05/08/15 0527 05/09/15 0425  Weight: 157.4 kg (347 lb 0.1 oz) 154.9 kg (341 lb 7.9 oz) 155.3 kg (342 lb 6 oz)    Exam: General: Alert and awake, oriented x3, not in any acute distress. HEENT: anicteric sclera, pupils reactive to light and accommodation, EOMI CVS: S1-S2 clear, no murmur rubs or gallops Chest: clear to auscultation bilaterally, no wheezing, rales or rhonchi Abdomen: soft nontender, nondistended, normal bowel sounds, no organomegaly Extremities: Redness and swelling in the right lower extremity. Neuro: Cranial nerves II-XII intact, no focal neurological deficits  Data Reviewed: Basic Metabolic Panel:  Recent Labs Lab 05/07/15 1551 05/08/15 0509  NA 137 138  K 4.6 3.8  CL 101 102  CO2 28 30  GLUCOSE 185* 119*  BUN 19 18  CREATININE 0.89 0.90  CALCIUM 8.7* 8.5*   Liver Function Tests:  Recent Labs Lab 05/07/15 1551  AST 17  ALT 14*  ALKPHOS 54  BILITOT 0.4  PROT 6.7  ALBUMIN 3.0*   No results for input(s): LIPASE, AMYLASE in the last 168 hours. No results for input(s): AMMONIA in the last 168 hours. CBC:  Recent Labs Lab 05/07/15 1551 05/08/15 0509  WBC 8.6 5.8  NEUTROABS 7.0  --  HGB 11.1* 10.1*  HCT 35.4* 32.1*  MCV 95.4 95.0  PLT 286 281   Cardiac Enzymes: No results for input(s): CKTOTAL, CKMB, CKMBINDEX, TROPONINI in the last 168 hours. BNP (last 3 results) No results for input(s): BNP in the last 8760 hours.  ProBNP (last 3 results)  Recent Labs  07/25/14 1209 09/12/14 1136  PROBNP 1647.0* 132.0*     CBG:  Recent Labs Lab 05/08/15 1331 05/08/15 1732 05/08/15 2135 05/09/15 0814 05/09/15 1226  GLUCAP 176* 162* 140* 93 142*    Micro Recent Results (from the past 240 hour(s))  Blood culture (routine x 2)     Status: None (Preliminary result)   Collection Time: 05/07/15  9:47 PM  Result Value Ref Range Status   Specimen Description BLOOD RIGHT HAND  Final   Special Requests BOTTLES DRAWN AEROBIC ONLY 3CC  Final   Culture   Final           BLOOD CULTURE RECEIVED NO GROWTH TO DATE CULTURE WILL BE HELD FOR 5 DAYS BEFORE ISSUING A FINAL NEGATIVE REPORT Performed at [facility]    Report Status PENDING  Incomplete  Blood culture (routine x 2)     Status: None (Preliminary result)   Collection Time: 05/07/15 11:49 PM  Result Value Ref Range Status   Specimen Description BLOOD RIGHT HAND  Final   Special Requests BOTTLES DRAWN AEROBIC ONLY 2CCS  Final   Culture   Final           BLOOD CULTURE RECEIVED NO GROWTH TO DATE CULTURE WILL BE HELD FOR 5 DAYS BEFORE ISSUING A FINAL NEGATIVE REPORT Note: Culture results may be compromised due to an inadequate volume of blood received in culture bottles. Performed at [facility]    Report Status PENDING  Incomplete  Urine culture     Status: None   Collection Time: 05/08/15 12:42 AM  Result Value Ref Range Status   Specimen Description URINE, CLEAN CATCH  Final   Special Requests NONE  Final   Colony Count NO GROWTH Performed at [facility]   Final   Culture NO GROWTH Performed at [facility]   Final   Report Status 05/09/2015 FINAL  Final     Studies: No results found.  Scheduled Meds: . clopidogrel  75 mg Oral Daily  . enoxaparin (LOVENOX) injection  40 mg Subcutaneous Q24H  . furosemide  40 mg Oral Daily  . gabapentin  300 mg Oral BID  . glimepiride  4 mg Oral BID  . insulin aspart  0-9 Units Subcutaneous TID WC  . losartan  100 mg Oral Daily  . metoprolol  50 mg Oral BID  .  mometasone-formoterol  2 puff Inhalation BID  . pioglitazone  45 mg Oral Daily  . piperacillin-tazobactam (ZOSYN)  IV  3.375 g Intravenous 3 times per day  . potassium chloride SA  40 mEq Oral Daily  . pravastatin  40 mg Oral Daily  . sodium chloride  3 mL Intravenous Q12H  . vancomycin  1,250 mg Intravenous Q12H  . vitamin B-12  1,000 mcg Oral Daily   Continuous Infusions:      Time spent: 35 minutes    [facility] A  [facility] Pager [phone] If 7PM-7AM, please contact night-coverage at [email or web address], password [email or web address] 05/09/2015, 1:16 PM  LOS: 2 days

## 2015-05-09 NOTE — Care Management Note (Signed)
Case Management Note  Patient Details  Name: Charles [name] MRN: [id number] Date of Birth: Dec 18, 1940  Subjective/Objective:      Patient lives with spouse, NCM will cont to follow for dc needs.              Action/Plan:   Expected Discharge Date:                  Expected Discharge Plan:  [place]  In-House Referral:     Discharge planning Services  CM Consult  Post Acute Care Choice:    Choice offered to:     DME Arranged:    DME Agency:     HH Arranged:    [place] Agency:     Status of Service:  In process, will continue to follow  Medicare Important Message Given:  Yes Date Medicare IM Given:  05/09/15 Medicare IM give by:  Tomi Bamberger RN Date Additional Medicare IM Given:    Additional Medicare Important Message give by:     If discussed at [place] of Stay Meetings, dates discussed:    Additional Comments:  Zenon Mayo, RN 05/09/2015, 11:25 AM

## 2015-05-10 LAB — BASIC METABOLIC PANEL
ANION GAP: 6 (ref 5–15)
BUN: 13 mg/dL (ref 6–20)
CALCIUM: 8.3 mg/dL — AB (ref 8.9–10.3)
CHLORIDE: 100 mmol/L — AB (ref 101–111)
CO2: 29 mmol/L (ref 22–32)
CREATININE: 0.99 mg/dL (ref 0.61–1.24)
GFR calc non Af Amer: >60 mL/min (ref >60)
Glucose, Bld: 129 mg/dL — ABNORMAL HIGH (ref 65–99)
Potassium: 4.1 mmol/L (ref 3.5–5.1)
Sodium: 135 mmol/L (ref 135–145)

## 2015-05-10 LAB — GLUCOSE, CAPILLARY
GLUCOSE-CAPILLARY: 151 mg/dL — AB (ref 65–99)
Glucose-Capillary: 137 mg/dL — ABNORMAL HIGH (ref 65–99)
Glucose-Capillary: 174 mg/dL — ABNORMAL HIGH (ref 65–99)
Glucose-Capillary: 123 mg/dL — ABNORMAL HIGH (ref 65–99)

## 2015-05-10 NOTE — Progress Notes (Signed)
Patient has CPAP by bedside and ready for use.  Patient states that he places on himself when ready for bed.

## 2015-05-10 NOTE — Progress Notes (Signed)
[facility] PROGRESS NOTE   Charles [name] KLK:[id number] DOB: 10-03-1941 DOA: 05/07/2015 PCP: [name], MD  HPI/Subjective: Improving but is slightly still has some redness and swelling. I have asked him to keep his legs elevated especially the right one. Patient is somewhat hard of hearing  Assessment/Plan: Principal Problem:   Cellulitis Active Problems:   Essential hypertension   Coronary atherosclerosis   Type 2 diabetes, uncontrolled, with retinopathy   Morbid obesity   PAF (paroxysmal atrial fibrillation)   COPD     Right lower extremity cellulitis Patient has right lower cellulitis for the past 2 weeks. Been treated with doxycycline, he reported it was for a cut around his great toe. Recently started on Bactrim so he was taking both doxycycline and Bactrim but the redness was not improving. Admitted to the hospital started on Zosyn and vancomycin, improving now. Continue current antibiotics  Diabetes mellitus type 2 Appears to be controlled diabetes hemoglobin A1c was 6.3 on 04/18/15. On Amaryl, Actos and lispro sliding scale at home, hold all home hypoglycemic agents. Started on carbohydrate modified diet and insulin sliding scale.  Paroxysmal atrial fibrillation Rate is controlled with metoprolol. Patient is not on anticoagulation secondary to personal preference. This patients CHA2DS2-VASc Score and unadjusted Ischemic Stroke Rate (% per year) is equal to 4.8 % stroke rate/year from a score of 4 for HTN, DM, MI and age of 19. Above score calculated as 1 point each if present [CHF, HTN, DM, Vascular=MI/PAD/Aortic Plaque, Age if 65-74, or Male] Above score calculated as 2 points each if present [Age > 75, or Stroke/TIA/TE]  COPD Stable, no evidence of decompensation.   Code Status: Full Code Family Communication: Plan discussed with the patient. Disposition Plan: Remains inpatient Diet: Diet heart healthy/carb modified Room service appropriate?:  Yes; Fluid consistency:: Thin  Consultants:  None  Procedures:  None  Antibiotics:  Zosyn and vancomycin   Objective: Filed Vitals:   05/10/15 0556  BP: 113/50  Pulse:   Temp: 98.6 F (37 C)  Resp: 16    Intake/Output Summary (Last 24 hours) at 05/10/15 1208 Last data filed at 05/10/15 1101  Gross per 24 hour  Intake   1250 ml  Output   4195 ml  Net  -2945 ml   Filed Weights   05/08/15 0527 05/09/15 0425 05/10/15 0556  Weight: 154.9 kg (341 lb 7.9 oz) 155.3 kg (342 lb 6 oz) 156.5 kg (345 lb 0.3 oz)    Exam: General: Alert and awake, oriented x3, not in any acute distress. HEENT: anicteric sclera, pupils reactive to light and accommodation, EOMI CVS: S1-S2 clear, no murmur rubs or gallops Chest: clear to auscultation bilaterally, no wheezing, rales or rhonchi Abdomen: soft nontender, nondistended, normal bowel sounds, no organomegaly Extremities: Redness and swelling in the right lower extremity. Neuro: Cranial nerves II-XII intact, no focal neurological deficits  Data Reviewed: Basic Metabolic Panel:  Recent Labs Lab 05/07/15 1551 05/08/15 0509 05/10/15 0442  NA 137 138 135  K 4.6 3.8 4.1  CL 101 102 100*  CO2 28 30 29   GLUCOSE 185* 119* 129*  BUN 19 18 13   CREATININE 0.89 0.90 0.99  CALCIUM 8.7* 8.5* 8.3*   Liver Function Tests:  Recent Labs Lab 05/07/15 1551  AST 17  ALT 14*  ALKPHOS 54  BILITOT 0.4  PROT 6.7  ALBUMIN 3.0*   No results for input(s): LIPASE, AMYLASE in the last 168 hours. No results for input(s): AMMONIA in the last 168 hours. CBC:  Recent Labs Lab 05/07/15 1551 05/08/15 0509  WBC 8.6 5.8  NEUTROABS 7.0  --   HGB 11.1* 10.1*  HCT 35.4* 32.1*  MCV 95.4 95.0  PLT 286 281   Cardiac Enzymes: No results for input(s): CKTOTAL, CKMB, CKMBINDEX, TROPONINI in the last 168 hours. BNP (last 3 results) No results for input(s): BNP in the last 8760 hours.  ProBNP (last 3 results)  Recent Labs  07/25/14 1209  09/12/14 1136  PROBNP 1647.0* 132.0*    CBG:  Recent Labs Lab 05/09/15 0814 05/09/15 1226 05/09/15 1654 05/09/15 2140 05/10/15 0752  GLUCAP 93 142* 116* 164* 137*    Micro Recent Results (from the past 240 hour(s))  Blood culture (routine x 2)     Status: None (Preliminary result)   Collection Time: 05/07/15  9:47 PM  Result Value Ref Range Status   Specimen Description BLOOD RIGHT HAND  Final   Special Requests BOTTLES DRAWN AEROBIC ONLY 3CC  Final   Culture   Final           BLOOD CULTURE RECEIVED NO GROWTH TO DATE CULTURE WILL BE HELD FOR 5 DAYS BEFORE ISSUING A FINAL NEGATIVE REPORT Performed at [facility]    Report Status PENDING  Incomplete  Blood culture (routine x 2)     Status: None (Preliminary result)   Collection Time: 05/07/15 11:49 PM  Result Value Ref Range Status   Specimen Description BLOOD RIGHT HAND  Final   Special Requests BOTTLES DRAWN AEROBIC ONLY 2CCS  Final   Culture   Final           BLOOD CULTURE RECEIVED NO GROWTH TO DATE CULTURE WILL BE HELD FOR 5 DAYS BEFORE ISSUING A FINAL NEGATIVE REPORT Note: Culture results may be compromised due to an inadequate volume of blood received in culture bottles. Performed at [facility]    Report Status PENDING  Incomplete  Urine culture     Status: None   Collection Time: 05/08/15 12:42 AM  Result Value Ref Range Status   Specimen Description URINE, CLEAN CATCH  Final   Special Requests NONE  Final   Colony Count NO GROWTH Performed at [facility]   Final   Culture NO GROWTH Performed at [facility]   Final   Report Status 05/09/2015 FINAL  Final     Studies: No results found.  Scheduled Meds: . clopidogrel  75 mg Oral Daily  . furosemide  40 mg Oral Daily  . gabapentin  300 mg Oral BID  . glimepiride  4 mg Oral BID  . insulin aspart  0-9 Units Subcutaneous TID WC  . losartan  100 mg Oral Daily  . metoprolol  50 mg Oral BID  . mometasone-formoterol  2  puff Inhalation BID  . nystatin   Topical TID  . pioglitazone  45 mg Oral Daily  . piperacillin-tazobactam (ZOSYN)  IV  3.375 g Intravenous 3 times per day  . potassium chloride SA  40 mEq Oral Daily  . pravastatin  40 mg Oral Daily  . sodium chloride  3 mL Intravenous Q12H  . vancomycin  1,250 mg Intravenous Q12H  . vitamin B-12  1,000 mcg Oral Daily   Continuous Infusions:      Time spent: 35 minutes    [facility] A  [facility] Pager [phone] If 7PM-7AM, please contact night-coverage at [email or web address], password [facility] 05/10/2015, 12:08 PM  LOS: 3 days

## 2015-05-10 NOTE — Progress Notes (Signed)
ANTIBIOTIC CONSULT NOTE -follow up Pharmacy Consult for Vancomycin and Zosyn Indication: Right LE cellulitis  Allergies  Allergen Reactions  . Xarelto [Rivaroxaban]     rash    Patient Measurements: Height: 6\' 4"  (193 cm) Weight: (!) 345 lb 0.3 oz (156.5 kg) IBW/kg (Calculated) : 86.8  Vital Signs: Temp: 98.6 F (37 C) (05/28 0556) Temp Source: Oral (05/28 0556) BP: 113/50 mmHg (05/28 0556) Intake/Output from previous day: 05/27 0701 - 05/28 0700 In: 1370 [P.O.:720; IV Piggyback:650] Out: 0626 [Urine:4445] Intake/Output from this shift: Total I/O In: 120 [P.O.:120] Out: 500 [Urine:500]  Labs:  Recent Labs  05/07/15 1551 05/08/15 0509 05/10/15 0442  WBC 8.6 5.8  --   HGB 11.1* 10.1*  --   PLT 286 281  --   CREATININE 0.89 0.90 0.99   Estimated Creatinine Clearance: 107.8 mL/min (by C-G formula based on Cr of 0.99). No results for input(s): VANCOTROUGH, VANCOPEAK, VANCORANDOM, GENTTROUGH, GENTPEAK, GENTRANDOM, TOBRATROUGH, TOBRAPEAK, TOBRARND, AMIKACINPEAK, AMIKACINTROU, AMIKACIN in the last 72 hours.   Microbiology: Recent Results (from the past 720 hour(s))  Blood culture (routine x 2)     Status: None (Preliminary result)   Collection Time: 05/07/15  9:47 PM  Result Value Ref Range Status   Specimen Description BLOOD RIGHT HAND  Final   Special Requests BOTTLES DRAWN AEROBIC ONLY 3CC  Final   Culture   Final           BLOOD CULTURE RECEIVED NO GROWTH TO DATE CULTURE WILL BE HELD FOR 5 DAYS BEFORE ISSUING A FINAL NEGATIVE REPORT Performed at [facility]    Report Status PENDING  Incomplete  Blood culture (routine x 2)     Status: None (Preliminary result)   Collection Time: 05/07/15 11:49 PM  Result Value Ref Range Status   Specimen Description BLOOD RIGHT HAND  Final   Special Requests BOTTLES DRAWN AEROBIC ONLY 2CCS  Final   Culture   Final           BLOOD CULTURE RECEIVED NO GROWTH TO DATE CULTURE WILL BE HELD FOR 5 DAYS BEFORE ISSUING A FINAL  NEGATIVE REPORT Note: Culture results may be compromised due to an inadequate volume of blood received in culture bottles. Performed at [facility]    Report Status PENDING  Incomplete  Urine culture     Status: None   Collection Time: 05/08/15 12:42 AM  Result Value Ref Range Status   Specimen Description URINE, CLEAN CATCH  Final   Special Requests NONE  Final   Colony Count NO GROWTH Performed at [facility]   Final   Culture NO GROWTH Performed at [facility]   Final   Report Status 05/09/2015 FINAL  Final    Assessment: 74 yo M presenting to ED on 05/07/2015 with worsening cellulitis of right LE. Pharmacy consulted to dose Vancomycin and Zosyn. Afeb, WBC wnl.  Pt on both doxy and bactrim PTA.  Creat stable at 0.99.  Per MD note 5/27 cellulitis is slightly improving.  5/25 vanc > 5/25 zosyn >  5/25 blood RS:WNIO 5/26 urine cx:ngf   Goal of Therapy:  Vancomycin trough level 15-20 mcg/ml  Plan:  -?expected length of therapy of IV abxs? - continue vancomycin 1250 mg IV q12h - continue Zosyn 3.375 gm IV q8h (4 hour infusion) - Monitor renal function, temp, WBC, C&S, VT at [facility] - if vancomycin continues, will check a trough level soon  Eudelia Bunch, Pharm.D. [phone] 05/10/2015 12:09 PM

## 2015-05-10 NOTE — Evaluation (Signed)
Physical Therapy Evaluation Patient Details Name: Charles [name] MRN: [id number] DOB: [birth year] Today's Date: 05/10/2015   History of Present Illness  Pt adm with cellulitis of RLE. PMH - CAD, HTN, DM, CVA, multiple toe amputations, diabetic neuropathy  Clinical Impression  Pt admitted with above diagnosis and presents to PT with functional limitations due to deficits listed below (See PT problem list). Pt needs skilled PT to maximize independence and safety to allow discharge to home with wife. Feel his mobility will improve as RLE pain improves.     Follow Up Recommendations No PT follow up    Equipment Recommendations  None recommended by PT    Recommendations for Other Services       Precautions / Restrictions Precautions Precautions: Fall      Mobility  Bed Mobility Overal bed mobility: Needs Assistance Bed Mobility: Sit to Supine       Sit to supine: Supervision      Transfers Overall transfer level: Needs assistance Equipment used: Rolling walker (2 wheeled) Transfers: Sit to/from Stand Sit to Stand: Min assist         General transfer comment: Assist to bring hips up.  Ambulation/Gait Ambulation/Gait assistance: Min assist Ambulation Distance (Feet): 3 Feet Assistive device: Rolling walker (2 wheeled) Gait Pattern/deviations: Step-to pattern;Decreased step length - left;Decreased stance time - right;Decreased step length - right Gait velocity: decr Gait velocity interpretation: Below normal speed for age/gender General Gait Details: Pt limited by painful RLE.  Stairs            Wheelchair Mobility    Modified Rankin (Stroke Patients Only)       Balance Overall balance assessment: Needs assistance Sitting-balance support: No upper extremity supported;Feet supported Sitting balance-Leahy Scale: Good     Standing balance support: Bilateral upper extremity supported Standing balance-Leahy Scale: Poor Standing balance comment: support  of walker and min guard for static standing.                             Pertinent Vitals/Pain Pain Assessment: Faces Faces Pain Scale: Hurts whole lot Pain Location: rt leg Pain Descriptors / Indicators: Aching Pain Intervention(s): Limited activity within patient's tolerance;Repositioned (returned to bed with leg elevated)    Home Living Family/patient expects to be discharged to:: Private residence Living Arrangements: Spouse/significant other Available Help at Discharge: Family;Available 24 hours/day Type of Home: House Home Access: Stairs to enter Entrance Stairs-Rails: None Entrance Stairs-Number of Steps: 2 Home Layout: Two level;Able to live on main level with bedroom/bathroom (son live upstairs) Home Equipment: Gilford Rile - 2 wheels;Cane - single point      Prior Function Level of Independence: Needs assistance;Independent with assistive device(s)   Gait / Transfers Assistance Needed: Amb with cane or walker  ADL's / Homemaking Assistance Needed: wife assists with ADL's        Hand Dominance   Dominant Hand: Right    Extremity/Trunk Assessment   Upper Extremity Assessment: Defer to OT evaluation           Lower Extremity Assessment: Generalized weakness;RLE deficits/detail RLE Deficits / Details: Limited by pain       Communication   Communication: HOH  Cognition Arousal/Alertness: Awake/alert Behavior During Therapy: WFL for tasks assessed/performed Overall Cognitive Status: Within Functional Limits for tasks assessed                      General Comments      Exercises  Assessment/Plan    PT Assessment Patient needs continued PT services  PT Diagnosis Difficulty walking;Acute pain   PT Problem List Decreased strength;Decreased activity tolerance;Decreased balance;Decreased mobility;Obesity;Pain  PT Treatment Interventions DME instruction;Gait training;Functional mobility training;Therapeutic activities;Therapeutic  exercise;Balance training;Patient/family education   PT Goals (Current goals can be found in the Care Plan section) Acute Rehab PT Goals Patient Stated Goal: return home when swelling goes down PT Goal Formulation: With patient Time For Goal Achievement: 05/17/15 Potential to Achieve Goals: Good    Frequency Min 3X/week   Barriers to discharge        Co-evaluation               End of Session   Activity Tolerance: Patient limited by pain Patient left: in bed;with call bell/phone within reach;with family/visitor present Nurse Communication: Mobility status         Time: 3299-2426 PT Time Calculation (min) (ACUTE ONLY): 19 min   Charges:   PT Evaluation $Initial PT Evaluation Tier I: 1 Procedure     PT G Codes:        Krystalyn Kubota 05-11-2015, 2:45 PM  [facility] PT [phone]

## 2015-05-11 LAB — GLUCOSE, CAPILLARY
GLUCOSE-CAPILLARY: 174 mg/dL — AB (ref 65–99)
GLUCOSE-CAPILLARY: 171 mg/dL — AB (ref 65–99)
Glucose-Capillary: 190 mg/dL — ABNORMAL HIGH (ref 65–99)

## 2015-05-11 NOTE — Progress Notes (Signed)
Patient said he will put the CPAP on by himself and he will call RT if he needs help.

## 2015-05-11 NOTE — Progress Notes (Signed)
[facility] PROGRESS NOTE   Charles [name] FXT:[id number] DOB: [birth year] DOA: 05/07/2015 PCP: [name], MD  HPI/Subjective: Slightly improving, complains about severe pain with ambulation. Hold discharge today, likely to be discharged in a.m.  Assessment/Plan: Principal Problem:   Cellulitis Active Problems:   Essential hypertension   Coronary atherosclerosis   Type 2 diabetes, uncontrolled, with retinopathy   Morbid obesity   PAF (paroxysmal atrial fibrillation)   COPD     Right lower extremity cellulitis Patient has right lower cellulitis for the past 2 weeks. Been treated with doxycycline, he reported it was for a cut around his great toe. Recently started on Bactrim so he was taking both doxycycline and Bactrim but the redness was not improving. Admitted to the hospital started on Zosyn and vancomycin, improving now. Improving, not back to baseline yet, continue current management.  Diabetes mellitus type 2 Appears to be controlled diabetes hemoglobin A1c was 6.3 on 04/18/15. On Amaryl, Actos and lispro sliding scale at home, hold all home hypoglycemic agents. Started on carbohydrate modified diet and insulin sliding scale.  Paroxysmal atrial fibrillation Rate is controlled with metoprolol. Patient is not on anticoagulation secondary to personal preference. This patients CHA2DS2-VASc Score and unadjusted Ischemic Stroke Rate (% per year) is equal to 4.8 % stroke rate/year from a score of 4 for HTN, DM, MI and age of 50. Above score calculated as 1 point each if present [CHF, HTN, DM, Vascular=MI/PAD/Aortic Plaque, Age if 65-74, or Male] Above score calculated as 2 points each if present [Age > 75, or Stroke/TIA/TE]  COPD Stable, no evidence of decompensation.   Code Status: Full Code Family Communication: Plan discussed with the patient. Disposition Plan: Remains inpatient Diet: Diet heart healthy/carb modified Room service appropriate?: Yes; Fluid  consistency:: Thin  Consultants:  None  Procedures:  None  Antibiotics:  Zosyn and vancomycin   Objective: Filed Vitals:   05/11/15 0457  BP: 108/33  Pulse: 96  Temp: 98.6 F (37 C)  Resp: 18    Intake/Output Summary (Last 24 hours) at 05/11/15 1319 Last data filed at 05/11/15 1047  Gross per 24 hour  Intake    760 ml  Output   2925 ml  Net  -2165 ml   Filed Weights   05/09/15 0425 05/10/15 0556 05/11/15 0457  Weight: 155.3 kg (342 lb 6 oz) 156.5 kg (345 lb 0.3 oz) 156.6 kg (345 lb 3.9 oz)    Exam: General: Alert and awake, oriented x3, not in any acute distress. HEENT: anicteric sclera, pupils reactive to light and accommodation, EOMI CVS: S1-S2 clear, no murmur rubs or gallops Chest: clear to auscultation bilaterally, no wheezing, rales or rhonchi Abdomen: soft nontender, nondistended, normal bowel sounds, no organomegaly Extremities: Redness and swelling in the right lower extremity. Neuro: Cranial nerves II-XII intact, no focal neurological deficits  Data Reviewed: Basic Metabolic Panel:  Recent Labs Lab 05/07/15 1551 05/08/15 0509 05/10/15 0442  NA 137 138 135  K 4.6 3.8 4.1  CL 101 102 100*  CO2 28 30 29   GLUCOSE 185* 119* 129*  BUN 19 18 13   CREATININE 0.89 0.90 0.99  CALCIUM 8.7* 8.5* 8.3*   Liver Function Tests:  Recent Labs Lab 05/07/15 1551  AST 17  ALT 14*  ALKPHOS 54  BILITOT 0.4  PROT 6.7  ALBUMIN 3.0*   No results for input(s): LIPASE, AMYLASE in the last 168 hours. No results for input(s): AMMONIA in the last 168 hours. CBC:  Recent Labs Lab 05/07/15 1551  05/08/15 0509  WBC 8.6 5.8  NEUTROABS 7.0  --   HGB 11.1* 10.1*  HCT 35.4* 32.1*  MCV 95.4 95.0  PLT 286 281   Cardiac Enzymes: No results for input(s): CKTOTAL, CKMB, CKMBINDEX, TROPONINI in the last 168 hours. BNP (last 3 results) No results for input(s): BNP in the last 8760 hours.  ProBNP (last 3 results)  Recent Labs  07/25/14 1209 09/12/14 1136    PROBNP 1647.0* 132.0*    CBG:  Recent Labs Lab 05/10/15 0752 05/10/15 1210 05/10/15 1709 05/10/15 2122 05/11/15 1200  GLUCAP 137* 151* 174* 123* 174*    Micro Recent Results (from the past 240 hour(s))  Blood culture (routine x 2)     Status: None (Preliminary result)   Collection Time: 05/07/15  9:47 PM  Result Value Ref Range Status   Specimen Description BLOOD RIGHT HAND  Final   Special Requests BOTTLES DRAWN AEROBIC ONLY 3CC  Final   Culture   Final           BLOOD CULTURE RECEIVED NO GROWTH TO DATE CULTURE WILL BE HELD FOR 5 DAYS BEFORE ISSUING A FINAL NEGATIVE REPORT Performed at [facility]    Report Status PENDING  Incomplete  Blood culture (routine x 2)     Status: None (Preliminary result)   Collection Time: 05/07/15 11:49 PM  Result Value Ref Range Status   Specimen Description BLOOD RIGHT HAND  Final   Special Requests BOTTLES DRAWN AEROBIC ONLY 2CCS  Final   Culture   Final           BLOOD CULTURE RECEIVED NO GROWTH TO DATE CULTURE WILL BE HELD FOR 5 DAYS BEFORE ISSUING A FINAL NEGATIVE REPORT Note: Culture results may be compromised due to an inadequate volume of blood received in culture bottles. Performed at [facility]    Report Status PENDING  Incomplete  Urine culture     Status: None   Collection Time: 05/08/15 12:42 AM  Result Value Ref Range Status   Specimen Description URINE, CLEAN CATCH  Final   Special Requests NONE  Final   Colony Count NO GROWTH Performed at [facility]   Final   Culture NO GROWTH Performed at [facility]   Final   Report Status 05/09/2015 FINAL  Final     Studies: No results found.  Scheduled Meds: . clopidogrel  75 mg Oral Daily  . furosemide  40 mg Oral Daily  . gabapentin  300 mg Oral BID  . glimepiride  4 mg Oral BID  . insulin aspart  0-9 Units Subcutaneous TID WC  . losartan  100 mg Oral Daily  . metoprolol  50 mg Oral BID  . mometasone-formoterol  2 puff  Inhalation BID  . nystatin   Topical TID  . pioglitazone  45 mg Oral Daily  . piperacillin-tazobactam (ZOSYN)  IV  3.375 g Intravenous 3 times per day  . potassium chloride SA  40 mEq Oral Daily  . pravastatin  40 mg Oral Daily  . sodium chloride  3 mL Intravenous Q12H  . vancomycin  1,250 mg Intravenous Q12H  . vitamin B-12  1,000 mcg Oral Daily   Continuous Infusions:      Time spent: 35 minutes    [facility] A  [facility] Pager [phone] If 7PM-7AM, please contact night-coverage at [email or web address], password [facility] 05/11/2015, 1:19 PM  LOS: 4 days

## 2015-05-11 NOTE — Progress Notes (Signed)
Pt continues to refuse Dulera. Please consider DC.

## 2015-05-12 LAB — GLUCOSE, CAPILLARY
GLUCOSE-CAPILLARY: 168 mg/dL — AB (ref 65–99)
Glucose-Capillary: 108 mg/dL — ABNORMAL HIGH (ref 65–99)

## 2015-05-12 MED ORDER — CEPHALEXIN 500 MG PO CAPS: 500.0000 mg | ORAL_CAPSULE | Freq: Three times a day (TID) | ORAL | Status: DC

## 2015-05-12 MED ORDER — DOXYCYCLINE HYCLATE 100 MG PO TABS: 100.0000 mg | ORAL_TABLET | Freq: Two times a day (BID) | ORAL | Status: DC

## 2015-05-12 MED ORDER — FUROSEMIDE 10 MG/ML IJ SOLN
40.0000 mg | Freq: Once | INTRAMUSCULAR | Status: AC
  Administered 2015-05-12: 40 mg via INTRAVENOUS
  Filled 2015-05-12: qty 4

## 2015-05-12 NOTE — Evaluation (Signed)
Occupational Therapy Evaluation Patient Details Name: Charles [name] MRN: [id number] DOB: 1941-05-03 Today's Date: 05/12/2015    History of Present Illness Pt adm with cellulitis of RLE. PMH - CAD, HTN, DM, CVA, multiple toe amputations, diabetic neuropathy   Clinical Impression   Pt supposed to d/c today so discussed importance of pacing self (pt wanting to get start cutting the grass right away. Advised pt to not do so right now for safety and allow assist.) Discussed safe use of walker during tasks and DME options but pt declines 3in1. Wife is able to assist with LB self care so pt declines any AE. Will check on if pt is here after today for any other needs but ok to d/c from OT standpoint.    Follow Up Recommendations  No OT follow up;Supervision/Assistance - 24 hour    Equipment Recommendations  None recommended by OT    Recommendations for Other Services       Precautions / Restrictions Precautions Precautions: Fall Restrictions Weight Bearing Restrictions: No      Mobility Bed Mobility Overal bed mobility: Needs Assistance Bed Mobility: Supine to Sit     Supine to sit: Supervision Sit to supine: Supervision      Transfers Overall transfer level: Needs assistance Equipment used: Rolling walker (2 wheeled)   Sit to Stand: Min guard         General transfer comment: verbal cues for safety. pt also tends to sit before bringing walker all the way back to bed.    Balance                                            ADL Overall ADL's : Needs assistance/impaired Eating/Feeding: Independent;Sitting   Grooming: Wash/dry hands;Set up;Sitting   Upper Body Bathing: Set up;Sitting   Lower Body Bathing: Minimal assistance;Sit to/from stand   Upper Body Dressing : Set up;Sitting   Lower Body Dressing: Moderate assistance;Sit to/from stand   Toilet Transfer: Min guard;Ambulation;RW   Toileting- Water quality scientist and Hygiene: Min  guard;Sit to/from stand         General ADL Comments: Pt stating his R LE hurts a lot when it is in the dependent position but he was agreeable to get up with OT. He performed functional mobility in the room with min guard assist and walker and transferred into the bathroom but declined to sit on commode. He states he has a higher commode than the one here and a bar on one side and a vanity on the other and states there is only a bar to use here on one side. he states, "I like my commode and will be fine." Discussed option of 3in1 but pt declines stating he doesnt need it. Discussed and educated on safety with not attempting to get into a tub right now due to R LE pain intensity. Pt agreeable and states wife will assist with LB self care. Educated on option to sit at the sink for bathing but pt states he will likely stand.      Vision     Perception     Praxis      Pertinent Vitals/Pain Pain Assessment: Faces Faces Pain Scale: Hurts little more Pain Location: R LE Pain Descriptors / Indicators: Aching Pain Intervention(s): Repositioned     Hand Dominance Right   Extremity/Trunk Assessment Upper Extremity Assessment Upper Extremity Assessment: Overall [facility]  for tasks assessed           Communication Communication Communication: HOH   Cognition Arousal/Alertness: Awake/alert Behavior During Therapy: WFL for tasks assessed/performed Overall Cognitive Status: Within Functional Limits for tasks assessed                     General Comments       Exercises       Shoulder Instructions      Home Living Family/patient expects to be discharged to:: Private residence Living Arrangements: Spouse/significant other Available Help at Discharge: Family;Available 24 hours/day Type of Home: House Home Access: Stairs to enter [employer] of Steps: 2 Entrance Stairs-Rails: None Home Layout: Two level;Able to live on main level with bedroom/bathroom (son live  upstairs)     Bathroom Shower/Tub: Teacher, early years/pre: Handicapped height     Home Equipment: Environmental consultant - 2 wheels;Cane - single point;Grab bars - toilet          Prior Functioning/Environment Level of Independence: Needs assistance;Independent with assistive device(s)  Gait / Transfers Assistance Needed: Amb with cane or walker ADL's / Homemaking Assistance Needed: wife assists with ADL's        OT Diagnosis: Generalized weakness;Acute pain   OT Problem List: Decreased strength;Decreased knowledge of use of DME or AE;Pain   OT Treatment/Interventions: Self-care/ADL training;Patient/family education;Therapeutic activities;DME and/or AE instruction    OT Goals(Current goals can be found in the care plan section) Acute Rehab OT Goals Patient Stated Goal: home OT Goal Formulation: With patient Time For Goal Achievement: 05/26/15 Potential to Achieve Goals: Good  OT Frequency: Min 2X/week   Barriers to D/C:            Co-evaluation              End of Session Equipment Utilized During Treatment: Rolling walker  Activity Tolerance: Patient tolerated treatment well Patient left: in bed;with call bell/phone within reach   Time: 1105-1135 OT Time Calculation (min): 30 min Charges:  OT General Charges $OT Visit: 1 Procedure OT Evaluation $Initial OT Evaluation Tier I: 1 Procedure OT Treatments $Therapeutic Activity: 8-22 mins G-Codes:    Jules Schick  [phone] 05/12/2015, 12:08 PM

## 2015-05-12 NOTE — Progress Notes (Signed)
Utilization Review completed. Yoltzin Barg RN BSN CM 

## 2015-05-12 NOTE — Discharge Summary (Signed)
Physician Discharge Summary  Charles [name]:[id number] DOB: 1941-02-14 DOA: 05/07/2015  PCP: [name], MD  Admit date: 05/07/2015 Discharge date: 05/12/2015  Time spent: 40 minutes  Recommendations for Outpatient Follow-up:  1. Follow-up with primary care physician within one week. 2. Doxycycline and Keflex for 10 more days.  Discharge Diagnoses:  Principal Problem:   Cellulitis Active Problems:   Essential hypertension   Coronary atherosclerosis   Type 2 diabetes, uncontrolled, with retinopathy   Morbid obesity   PAF (paroxysmal atrial fibrillation)   COPD    Discharge Condition: Stable  Diet recommendation: Heart Healthy  Filed Weights   05/10/15 0556 05/11/15 0457 05/12/15 6010  Weight: 156.5 kg (345 lb 0.3 oz) 156.6 kg (345 lb 3.9 oz) 153.6 kg (338 lb 10 oz)    History of present illness:  74 yo male h/o iddm, CAD, MO, htn, afib has been on doxycyline for a sore on his right foot for last 2 weeks. The last 2 days the leg has gotten red and swollen and painful. No fevers at home. He went to see his pcp yesterday who put him on bactrim, he has taken 3 doses of bactrim and the leg is getting worse. More red anterior. The foot is not so red. He has not been eating as well and not feeling too good. No n/v/d. He is not anticoagulated as he refuses due to cost and inconvenience of coumadin.  Hospital Course:   Right lower extremity cellulitis Patient has right lower cellulitis for the past 2 weeks. Been treated with doxycycline, he reported it was for a cut around his great toe. Recently started on Bactrim so he was taking both doxycycline and Bactrim but the redness was not improving. Admitted to the hospital started on Zosyn and vancomycin, improving now. Improving, was able to walk with minimal pain, discharge on doxycycline and Keflex for 10 more days.  Diabetes mellitus type 2 Appears to be controlled diabetes hemoglobin A1c was 6.3 on 04/18/15. On  Amaryl, Actos and lispro sliding scale at home. Placed on carbohydrate modified diet and SSI, while he was in the hospital. Restarted home medications on discharge.  Paroxysmal atrial fibrillation Rate is controlled with metoprolol. On Plavix only, not on anticoagulation secondary to personal preference. This patients CHA2DS2-VASc Score and unadjusted Ischemic Stroke Rate (% per year) is equal to 4.8 % stroke rate/year from a score of 4 for HTN, DM, MI and age of 41. Above score calculated as 1 point each if present [CHF, HTN, DM, Vascular=MI/PAD/Aortic Plaque, Age if 65-74, or Male] Above score calculated as 2 points each if present [Age > 75, or Stroke/TIA/TE]  I have discussed about anticoagulation with him, he said "I can't go every day and check my INR, I'm allergic to Xarelto and the other one is too damn pricey, I don't want to be on any, I am on Plavix. I think it will do it"   COPD Stable, no evidence of decompensation.    Procedures:  None  Consultations:  None  Discharge Exam: Filed Vitals:   05/12/15 0623  BP: 105/42  Pulse: 70  Temp:   Resp:    General: Alert and awake, oriented x3, not in any acute distress. HEENT: anicteric sclera, pupils reactive to light and accommodation, EOMI CVS: S1-S2 clear, no murmur rubs or gallops Chest: clear to auscultation bilaterally, no wheezing, rales or rhonchi Abdomen: soft nontender, nondistended, normal bowel sounds, no organomegaly Extremities: no cyanosis, clubbing or edema noted bilaterally Neuro: Cranial  nerves II-XII intact, no focal neurological deficits  Discharge Instructions   Discharge Instructions    Diet - low sodium heart healthy    Complete by:  As directed      Increase activity slowly    Complete by:  As directed           Current Discharge Medication List    START taking these medications   Details  cephALEXin (KEFLEX) 500 MG capsule Take 1 capsule (500 mg total) by mouth 3 (three) times  daily. Qty: 30 capsule, Refills: 0      CONTINUE these medications which have CHANGED   Details  doxycycline (VIBRA-TABS) 100 MG tablet Take 1 tablet (100 mg total) by mouth 2 (two) times daily. Qty: 20 tablet, Refills: 0      CONTINUE these medications which have NOT CHANGED   Details  acetaminophen (TYLENOL) 325 MG tablet Take 2 tablets (650 mg total) by mouth every 6 (six) hours as needed for mild pain, fever or headache (or Fever >/= 101).    aspirin 81 MG tablet Take 81 mg by mouth daily.    Cholecalciferol (EQL VITAMIN D3) 1000 UNITS tablet Take 1,000 Units by mouth daily.      clopidogrel (PLAVIX) 75 MG tablet Take 1 tablet (75 mg total) by mouth daily. Qty: 90 tablet, Refills: 3    furosemide (LASIX) 80 MG tablet Take 0.5 tablets (40 mg total) by mouth daily. Qty: 30 tablet, Refills: 0    gabapentin (NEURONTIN) 300 MG capsule Take 300 mg by mouth 2 (two) times daily.    glimepiride (AMARYL) 4 MG tablet Take 1 tablet (4 mg total) by mouth 2 (two) times daily. Qty: 180 tablet, Refills: 3    insulin lispro (HUMALOG KWIKPEN) 100 UNIT/ML KiwkPen Inject 5-20 Units into the skin 4 (four) times daily - after meals and at bedtime. Per sliding scale 100-120 per cbg    losartan (COZAAR) 100 MG tablet Take 1 tablet (100 mg total) by mouth daily. Qty: 90 tablet, Refills: 3    metoprolol (LOPRESSOR) 50 MG tablet Take 1 tablet (50 mg total) by mouth 2 (two) times daily. Qty: 180 tablet, Refills: 3    pioglitazone (ACTOS) 45 MG tablet Take 45 mg by mouth daily.     potassium chloride SA (K-DUR,KLOR-CON) 20 MEQ tablet Take 2 tablets (40 mEq total) by mouth daily. Qty: 90 tablet, Refills: 3    pravastatin (PRAVACHOL) 40 MG tablet Take 1 tablet (40 mg total) by mouth daily. Qty: 90 tablet, Refills: 3    silver sulfADIAZINE (SILVADENE) 1 % cream Apply 1 application topically daily. Qty: 50 g, Refills: 2    triamcinolone cream (KENALOG) 0.5 % Apply 1 application topically 2 (two)  times daily as needed (rash). Qty: 30 g, Refills: 3    vitamin B-12 (CYANOCOBALAMIN) 1000 MCG tablet Take 1,000 mcg by mouth daily.      nitroGLYCERIN (NITROSTAT) 0.4 MG SL tablet Place 0.4 mg under the tongue every 5 (five) minutes as needed. For chest pain    Spacer/Aero-Holding Chambers (BREATHERITE COLL SPACER ADULT) MISC Use w/proventil MDI Qty: 1 each, Refills: 1      STOP taking these medications     sulfamethoxazole-trimethoprim (BACTRIM DS,SEPTRA DS) 800-160 MG per tablet      albuterol (PROVENTIL HFA) 108 (90 BASE) MCG/ACT inhaler        Allergies  Allergen Reactions  . Xarelto [Rivaroxaban]     rash      The results of significant  diagnostics from this hospitalization (including imaging, microbiology, ancillary and laboratory) are listed below for reference.    Significant Diagnostic Studies: No results found.  Microbiology: Recent Results (from the past 240 hour(s))  Blood culture (routine x 2)     Status: None (Preliminary result)   Collection Time: 05/07/15  9:47 PM  Result Value Ref Range Status   Specimen Description BLOOD RIGHT HAND  Final   Special Requests BOTTLES DRAWN AEROBIC ONLY 3CC  Final   Culture   Final           BLOOD CULTURE RECEIVED NO GROWTH TO DATE CULTURE WILL BE HELD FOR 5 DAYS BEFORE ISSUING A FINAL NEGATIVE REPORT Performed at [facility]    Report Status PENDING  Incomplete  Blood culture (routine x 2)     Status: None (Preliminary result)   Collection Time: 05/07/15 11:49 PM  Result Value Ref Range Status   Specimen Description BLOOD RIGHT HAND  Final   Special Requests BOTTLES DRAWN AEROBIC ONLY 2CCS  Final   Culture   Final           BLOOD CULTURE RECEIVED NO GROWTH TO DATE CULTURE WILL BE HELD FOR 5 DAYS BEFORE ISSUING A FINAL NEGATIVE REPORT Note: Culture results may be compromised due to an inadequate volume of blood received in culture bottles. Performed at [facility]    Report Status PENDING   Incomplete  Urine culture     Status: None   Collection Time: 05/08/15 12:42 AM  Result Value Ref Range Status   Specimen Description URINE, CLEAN CATCH  Final   Special Requests NONE  Final   Colony Count NO GROWTH Performed at [facility]   Final   Culture NO GROWTH Performed at [facility]   Final   Report Status 05/09/2015 FINAL  Final     Labs: Basic Metabolic Panel:  Recent Labs Lab 05/07/15 1551 05/08/15 0509 05/10/15 0442  NA 137 138 135  K 4.6 3.8 4.1  CL 101 102 100*  CO2 28 30 29   GLUCOSE 185* 119* 129*  BUN 19 18 13   CREATININE 0.89 0.90 0.99  CALCIUM 8.7* 8.5* 8.3*   Liver Function Tests:  Recent Labs Lab 05/07/15 1551  AST 17  ALT 14*  ALKPHOS 54  BILITOT 0.4  PROT 6.7  ALBUMIN 3.0*   No results for input(s): LIPASE, AMYLASE in the last 168 hours. No results for input(s): AMMONIA in the last 168 hours. CBC:  Recent Labs Lab 05/07/15 1551 05/08/15 0509  WBC 8.6 5.8  NEUTROABS 7.0  --   HGB 11.1* 10.1*  HCT 35.4* 32.1*  MCV 95.4 95.0  PLT 286 281   Cardiac Enzymes: No results for input(s): CKTOTAL, CKMB, CKMBINDEX, TROPONINI in the last 168 hours. BNP: BNP (last 3 results) No results for input(s): BNP in the last 8760 hours.  ProBNP (last 3 results)  Recent Labs  07/25/14 1209 09/12/14 1136  PROBNP 1647.0* 132.0*    CBG:  Recent Labs Lab 05/10/15 2122 05/11/15 1200 05/11/15 1652 05/11/15 2155 05/12/15 0806  GLUCAP 123* 174* 171* 190* 108*       Signed:  Divon Krabill A  [facility] 05/12/2015, 11:16 AM

## 2015-05-12 NOTE — Progress Notes (Signed)
NURSING PROGRESS NOTE  [facility] [id number] Discharge Data: 05/12/2015 2:07 PM Attending Provider: Verlee Monte, MD WER:[id number] Plotnikov, MD     Bonnielee Haff to be D/C'd Home per MD order.  Discussed with the patient the After Visit Summary and all questions fully answered. All IV's discontinued with no bleeding noted. All belongings returned to patient for patient to take home. Patient was educated on Heart failure and how to monitor for fluid overload. He was also given all prescriptions. Awaiting volunteer transportation downstairs for d/c  Last Vital Signs:  Blood pressure 122/50, pulse 62, temperature 97.7 F (36.5 C), temperature source Oral, resp. rate 25, height 6\' 4"  (1.93 m), weight 153.6 kg (338 lb 10 oz), SpO2 98 %.  Discharge Medication List   Medication List    STOP taking these medications        sulfamethoxazole-trimethoprim 800-160 MG per tablet  Commonly known as:  BACTRIM DS,SEPTRA DS      TAKE these medications        acetaminophen 325 MG tablet  Commonly known as:  TYLENOL  Take 2 tablets (650 mg total) by mouth every 6 (six) hours as needed for mild pain, fever or headache (or Fever >/= 101).     aspirin 81 MG tablet  Take 81 mg by mouth daily.     BREATHERITE COLL SPACER ADULT Misc  Use w/proventil MDI     cephALEXin 500 MG capsule  Commonly known as:  KEFLEX  Take 1 capsule (500 mg total) by mouth 3 (three) times daily.     clopidogrel 75 MG tablet  Commonly known as:  PLAVIX  Take 1 tablet (75 mg total) by mouth daily.     doxycycline 100 MG tablet  Commonly known as:  VIBRA-TABS  Take 1 tablet (100 mg total) by mouth 2 (two) times daily.     EQL VITAMIN D3 1000 UNITS tablet  Generic drug:  Cholecalciferol  Take 1,000 Units by mouth daily.     furosemide 80 MG tablet  Commonly known as:  LASIX  Take 0.5 tablets (40 mg total) by mouth daily.     gabapentin 300 MG capsule  Commonly known as:  NEURONTIN  Take 300 mg by mouth 2 (two)  times daily.     glimepiride 4 MG tablet  Commonly known as:  AMARYL  Take 1 tablet (4 mg total) by mouth 2 (two) times daily.     HUMALOG KWIKPEN 100 UNIT/ML KiwkPen  Generic drug:  insulin lispro  Inject 5-20 Units into the skin 4 (four) times daily - after meals and at bedtime. Per sliding scale 100-120 per cbg     losartan 100 MG tablet  Commonly known as:  COZAAR  Take 1 tablet (100 mg total) by mouth daily.     metoprolol 50 MG tablet  Commonly known as:  LOPRESSOR  Take 1 tablet (50 mg total) by mouth 2 (two) times daily.     nitroGLYCERIN 0.4 MG SL tablet  Commonly known as:  NITROSTAT  Place 0.4 mg under the tongue every 5 (five) minutes as needed. For chest pain     pioglitazone 45 MG tablet  Commonly known as:  ACTOS  Take 45 mg by mouth daily.     potassium chloride SA 20 MEQ tablet  Commonly known as:  K-DUR,KLOR-CON  Take 2 tablets (40 mEq total) by mouth daily.     pravastatin 40 MG tablet  Commonly known as:  PRAVACHOL  Take 1  tablet (40 mg total) by mouth daily.     silver sulfADIAZINE 1 % cream  Commonly known as:  SILVADENE  Apply 1 application topically daily.     triamcinolone cream 0.5 %  Commonly known as:  KENALOG  Apply 1 application topically 2 (two) times daily as needed (rash).     vitamin B-12 1000 MCG tablet  Commonly known as:  CYANOCOBALAMIN  Take 1,000 mcg by mouth daily.

## 2015-05-13 ENCOUNTER — Telehealth: Payer: Self-pay | Admitting: *Deleted

## 2015-05-13 NOTE — Telephone Encounter (Signed)
Called pt concerning tcm appt no answer LMOM RTC.../lmb 

## 2015-05-14 LAB — CULTURE, BLOOD (ROUTINE X 2)
CULTURE: NO GROWTH
Culture: NO GROWTH

## 2015-05-14 NOTE — Telephone Encounter (Signed)
Called pt again completed tcm call below.../lmb Transition Care Management Follow-up Telephone Call D/c 05/12/15  How have you been since you were released from the hospital? Pt states he is doing ok   Do you understand why you were in the hospital? YES   Do you understand the discharge instrcutions? YES  Items Reviewed:  Medications reviewed: YES  Allergies reviewed: YES  Dietary changes reviewed: YES, heart healty  Referrals reviewed: No referrals needed   Functional Questionnaire:   Activities of Daily Living (ADLs):   He states he are independent in the following: ambulation, bathing and hygiene, feeding, continence, grooming, toileting and dressing States he doesn';t require assistance   Any transportation issues/concerns?: NO   Any patient concerns? NO   Confirmed importance and date/time of follow-up visits scheduled: YE, made appt 05/20/15 w/Dr. Plotnikov   Confirmed with patient if condition begins to worsen call PCP or go to the ER.

## 2015-05-19 ENCOUNTER — Ambulatory Visit (INDEPENDENT_AMBULATORY_CARE_PROVIDER_SITE_OTHER): Payer: Medicare Other | Admitting: Internal Medicine

## 2015-05-19 ENCOUNTER — Encounter: Payer: Self-pay | Admitting: Internal Medicine

## 2015-05-19 VITALS — BP 130/58 | HR 65 | Ht 76.0 in | Wt 338.8 lb

## 2015-05-19 DIAGNOSIS — R609 Edema, unspecified: Secondary | ICD-10-CM | POA: Diagnosis not present

## 2015-05-19 DIAGNOSIS — I5032 Chronic diastolic (congestive) heart failure: Secondary | ICD-10-CM | POA: Diagnosis not present

## 2015-05-19 DIAGNOSIS — I2510 Atherosclerotic heart disease of native coronary artery without angina pectoris: Secondary | ICD-10-CM

## 2015-05-19 LAB — BASIC METABOLIC PANEL
BUN: 25 mg/dL — AB (ref 6–23)
CALCIUM: 9.4 mg/dL (ref 8.4–10.5)
CO2: 31 mEq/L (ref 19–32)
CREATININE: 0.85 mg/dL (ref 0.40–1.50)
Chloride: 101 mEq/L (ref 96–112)
GFR: 93.72 mL/min (ref >60.00)
GLUCOSE: 67 mg/dL — AB (ref 70–99)
Potassium: 4.2 mEq/L (ref 3.5–5.1)
SODIUM: 138 meq/L (ref 135–145)

## 2015-05-19 LAB — CBC
HCT: 38.7 % — ABNORMAL LOW (ref 39.0–52.0)
HEMOGLOBIN: 12.5 g/dL — AB (ref 13.0–17.0)
MCHC: 32.3 g/dL (ref 30.0–36.0)
MCV: 92.7 fl (ref 78.0–100.0)
PLATELETS: 365 10*3/uL (ref 150.0–400.0)
RBC: 4.18 Mil/uL — ABNORMAL LOW (ref 4.22–5.81)
RDW: 15.9 % — ABNORMAL HIGH (ref 11.5–15.5)
WBC: 7.3 10*3/uL (ref 4.0–10.5)

## 2015-05-19 LAB — BRAIN NATRIURETIC PEPTIDE: PRO B NATRI PEPTIDE: 235 pg/mL — AB (ref 0.0–100.0)

## 2015-05-19 NOTE — Progress Notes (Signed)
Cardiology Office Note   Date:  05/19/2015   ID:  Charles [name], Charles [name] 01-Feb-1941, MRN [id number]  PCP:  [name], MD  Cardiologist:   Dorris Carnes, MD   Chief Complaint  Patient presents with  . Follow-up    Coronary Atherosclersis unspecified type of vessel, native or graft      History of Present Illness: Charles [name] is a 74 y.o. male with a history of CAD (MI in 2000), HTN, CVA, HLand atrial fib. Last cath done showed moderate CAD. Plan for medical Rx. He has been on coumadin and Xarelto in past I saw the patient in Sept 2015 He was hosp with a cellulitis in May    Leg better but not back to normal   Breathing is better    Had one wk of bad breathing   ONe night laid down months ago   No CP     Has appt with A PLotnikov tomorrow    Current Outpatient Prescriptions  Medication Sig Dispense Refill  . acetaminophen (TYLENOL) 325 MG tablet Take 2 tablets (650 mg total) by mouth every 6 (six) hours as needed for mild pain, fever or headache (or Fever >/= 101).    . ADVAIR DISKUS 250-50 MCG/DOSE AEPB Take 1 application by mouth daily.    Marland Kitchen albuterol (PROVENTIL HFA;VENTOLIN HFA) 108 (90 BASE) MCG/ACT inhaler Inhale 2 puffs into the lungs every 6 (six) hours as needed for wheezing or shortness of breath.    Marland Kitchen aspirin 81 MG tablet Take 81 mg by mouth daily.    . cephALEXin (KEFLEX) 500 MG capsule Take 1 capsule (500 mg total) by mouth 3 (three) times daily. 30 capsule 0  . Cholecalciferol (EQL VITAMIN D3) 1000 UNITS tablet Take 1,000 Units by mouth daily.      . clopidogrel (PLAVIX) 75 MG tablet Take 1 tablet (75 mg total) by mouth daily. 90 tablet 3  . doxycycline (VIBRA-TABS) 100 MG tablet Take 1 tablet (100 mg total) by mouth 2 (two) times daily. 20 tablet 0  . furosemide (LASIX) 80 MG tablet Take 0.5 tablets (40 mg total) by mouth daily. (Patient taking differently: Take 80 mg by mouth daily. ) 30 tablet 0  . gabapentin (NEURONTIN) 300 MG capsule Take 300 mg by mouth  2 (two) times daily.    Marland Kitchen glimepiride (AMARYL) 4 MG tablet Take 1 tablet (4 mg total) by mouth 2 (two) times daily. 180 tablet 3  . insulin lispro (HUMALOG KWIKPEN) 100 UNIT/ML KiwkPen Inject 5-20 Units into the skin 4 (four) times daily - after meals and at bedtime. Per sliding scale 100-120 per cbg    . metoprolol (LOPRESSOR) 50 MG tablet Take 1 tablet (50 mg total) by mouth 2 (two) times daily. 180 tablet 3  . nitroGLYCERIN (NITROSTAT) 0.4 MG SL tablet Place 0.4 mg under the tongue every 5 (five) minutes as needed. For chest pain    . pioglitazone (ACTOS) 45 MG tablet Take 45 mg by mouth daily.     . potassium chloride SA (K-DUR,KLOR-CON) 20 MEQ tablet Take 2 tablets (40 mEq total) by mouth daily. 90 tablet 3  . pravastatin (PRAVACHOL) 40 MG tablet Take 1 tablet (40 mg total) by mouth daily. 90 tablet 3  . silver sulfADIAZINE (SILVADENE) 1 % cream Apply 1 application topically daily. 50 g 2  . Spacer/Aero-Holding Chambers (BREATHERITE COLL SPACER ADULT) MISC Use w/proventil MDI 1 each 1  . triamcinolone cream (KENALOG) 0.5 % Apply 1 application topically  2 (two) times daily as needed (rash). 30 g 3  . vitamin B-12 (CYANOCOBALAMIN) 1000 MCG tablet Take 1,000 mcg by mouth daily.      Marland Kitchen losartan (COZAAR) 100 MG tablet Take 1 tablet (100 mg total) by mouth daily. 90 tablet 3   No current facility-administered medications for this visit.    Allergies:   Xarelto   Past Medical History  Diagnosis Date  . CAD (coronary artery disease)     a. Approx. 2000 - MI. Cath showed single vessel disease, PTCA dLAD/medical management. ;  b. NSTEMI 11/13 => LHC: prox and mid LAD 30%, dLAD 60%, pCFX 30%, inf branch of OM 40%, mRCA 50-60%, EF 55-60%; IVUS attempted for RCA but not successful; anatomy felt stable from 2000 => med Rx.  . Diverticulitis   . Hypertension   . Morbid obesity   . CVA (cerebral infarction)     a. Approx. 2002  . Cholelithiasis   . Hypercholesterolemia   . ED (erectile  dysfunction)   . OSA on CPAP   . Type II diabetes mellitus   . History of stomach ulcers   . History of MRSA infection     "little toe right foot" (10/26/2012)  . Tunnel vision     "both eyes since stroke"  . Claustrophobia   . B12 deficiency   . Diabetic neuropathy   . PAF (paroxysmal atrial fibrillation)     a. confirmed by event monitor. b. 02/2014 rash on Coumadin, patient decided to discontinue Xarelto due to possible rash, cost and lawyers ads on TV, agreed to take Plavix.  . Cellulitis 04/2015    rt lower extremity    Past Surgical History  Procedure Laterality Date  . Toe amputation  2006; 2009    "Dr. Blenda Mounts; big toe left foot; little toe on right foot" (10/26/2012)  . Cardiac catheterization  1990's  . Cerebral angiogram  ~ 2000  . Left heart catheterization with coronary angiogram N/A 10/27/2012    Procedure: LEFT HEART CATHETERIZATION WITH CORONARY ANGIOGRAM;  Surgeon: Burnell Blanks, MD;  Location: [facility] CATH LAB;  Service: Cardiovascular;  Laterality: N/A;     Social History:  The patient  reports that he has quit smoking. His smoking use included Cigarettes and Cigars. He has a 30 pack-year smoking history. His smokeless tobacco use includes Snuff. He reports that he drinks alcohol. He reports that he does not use illicit drugs.   Family History:  The patient's family history includes Cancer (age of onset: 7) in his mother; Heart disease (age of onset: 48) in his father. There is no history of Asthma.    ROS:  Please see the history of present illness. All other systems are reviewed and  Negative to the above problem except as noted.    PHYSICAL EXAM: VS:  BP 130/58 mmHg  Pulse 65  Ht 6\' 4"  (1.93 m)  Wt 338 lb 12.8 oz (153.679 kg)  BMI 41.26 kg/m2  SpO2 98%  GEN: Morbidly obese 74 yo in NAD   HEENT: normal Neck: no JVD, carotid bruits, or masses Cardiac: RRR; no murmurs, rubs, or gallops,1-2+ edema  Ext:  Erythema R leg Respiratory:  clear to  auscultation bilaterally, normal work of breathing GI: soft, nontender, nondistended, + BS  No hepatomegaly Neuro:  Strength and sensation are intact Psych: euthymic mood, full affect   EKG:  EKG is not ordered today.   Lipid Panel    Component Value Date/Time   CHOL 122 09/12/2014  1136   TRIG 53.0 09/12/2014 1136   TRIG 73 12/22/2006 0827   HDL 40.90 09/12/2014 1136   CHOLHDL 3 09/12/2014 1136   CHOLHDL 2.7 CALC 12/22/2006 0827   VLDL 10.6 09/12/2014 1136   LDLCALC 71 09/12/2014 1136      Wt Readings from Last 3 Encounters:  05/19/15 338 lb 12.8 oz (153.679 kg)  05/12/15 338 lb 10 oz (153.6 kg)  05/06/15 351 lb 6.4 oz (159.394 kg)      ASSESSMENT AND PLAN:  1  CAD  No symtpoms of angina  2.  PAF  patient refuses anticoagulation  Discussed risk of strok  He says he understands  3  Chronic diastolic CHF  Some edema today  Wil check labs Has appt with A Plotnikov tomorrow 3.  CKD  Check labs today  4.  Cellulitis  Pt reports improvement  Still red  Has appt with Plotnikov tomorrow.  WIll check labs today     F/U in Winter  Wll be in touch with pt re labs  Signed, Dorris Carnes, MD  05/19/2015 3:00 PM    [facility], [place], [place]  [address] Phone: [phone]; Fax: [phone]

## 2015-05-19 NOTE — Patient Instructions (Signed)
Medication Instructions:  Your physician recommends that you continue on your current medications as directed. Please refer to the Current Medication list given to you today.  Labwork: Your physician recommends that you have lab work today: BMP, CBC and BNP  Testing/Procedures: No new orders.  Follow-Up: Your physician wants you to follow-up in: December/January with Dr Harrington Challenger.  You will receive a reminder letter in the mail two months in advance. If you don't receive a letter, please call our office to schedule the follow-up appointment.   Any Other Special Instructions Will Be Listed Below (If Applicable).

## 2015-05-20 ENCOUNTER — Ambulatory Visit (INDEPENDENT_AMBULATORY_CARE_PROVIDER_SITE_OTHER): Payer: Medicare Other | Admitting: Internal Medicine

## 2015-05-20 ENCOUNTER — Encounter: Payer: Self-pay | Admitting: Internal Medicine

## 2015-05-20 VITALS — BP 130/66 | HR 69 | Temp 97.9°F | Wt 340.0 lb

## 2015-05-20 DIAGNOSIS — E1165 Type 2 diabetes mellitus with hyperglycemia: Secondary | ICD-10-CM

## 2015-05-20 DIAGNOSIS — E11319 Type 2 diabetes mellitus with unspecified diabetic retinopathy without macular edema: Secondary | ICD-10-CM

## 2015-05-20 DIAGNOSIS — IMO0002 Reserved for concepts with insufficient information to code with codable children: Secondary | ICD-10-CM

## 2015-05-20 DIAGNOSIS — E13628 Other specified diabetes mellitus with other skin complications: Secondary | ICD-10-CM

## 2015-05-20 DIAGNOSIS — L03119 Cellulitis of unspecified part of limb: Secondary | ICD-10-CM

## 2015-05-20 DIAGNOSIS — E11628 Type 2 diabetes mellitus with other skin complications: Secondary | ICD-10-CM

## 2015-05-20 MED ORDER — FUROSEMIDE 80 MG PO TABS: 80.0000 mg | ORAL_TABLET | Freq: Every day | ORAL | Status: DC

## 2015-05-20 NOTE — Progress Notes (Signed)
Pre visit review using our clinic review tool, if applicable. No additional management support is needed unless otherwise documented below in the visit note. 

## 2015-05-20 NOTE — Progress Notes (Signed)
Subjective:   HPI   F/u recent hosp stay:   Admit date: 05/07/2015 Discharge date: 05/12/2015    Recommendations for Outpatient Follow-up:  1. Follow-up with primary care physician within one week. 2. Doxycycline and Keflex for 10 more days.  Discharge Diagnoses:  Principal Problem:  Cellulitis Active Problems:  Essential hypertension  Coronary atherosclerosis  Type 2 diabetes, uncontrolled, with retinopathy  Morbid obesity  PAF (paroxysmal atrial fibrillation)  COPD    Discharge Condition: Stable  Diet recommendation: Heart Healthy  Filed Weights   05/10/15 0556 05/11/15 0457 05/12/15 1610  Weight: 156.5 kg (345 lb 0.3 oz) 156.6 kg (345 lb 3.9 oz) 153.6 kg (338 lb 10 oz)    History of present illness:  74 yo male h/o iddm, CAD, MO, htn, afib has been on doxycyline for a sore on his right foot for last 2 weeks. The last 2 days the leg has gotten red and swollen and painful. No fevers at home. He went to see his pcp yesterday who put him on bactrim, he has taken 3 doses of bactrim and the leg is getting worse. More red anterior. The foot is not so red. He has not been eating as well and not feeling too good. No n/v/d. He is not anticoagulated as he refuses due to cost and inconvenience of coumadin.  Hospital Course:   Right lower extremity cellulitis Patient has right lower cellulitis for the past 2 weeks. Been treated with doxycycline, he reported it was for a cut around his great toe. Recently started on Bactrim so he was taking both doxycycline and Bactrim but the redness was not improving. Admitted to the hospital started on Zosyn and vancomycin, improving now. Improving, was able to walk with minimal pain, discharge on doxycycline and Keflex for 10 more days.  Diabetes mellitus type 2 Appears to be controlled diabetes hemoglobin A1c was 6.3 on 04/18/15. On Amaryl, Actos and lispro sliding scale at home. Placed on carbohydrate  modified diet and SSI, while he was in the hospital. Restarted home medications on discharge.  Paroxysmal atrial fibrillation Rate is controlled with metoprolol. On Plavix only, not on anticoagulation secondary to personal preference. This patients CHA2DS2-VASc Score and unadjusted Ischemic Stroke Rate (% per year) is equal to 4.8 % stroke rate/year from a score of 4 for HTN, DM, MI and age of 70. Above score calculated as 1 point each if present [CHF, HTN, DM, Vascular=MI/PAD/Aortic Plaque, Age if 65-74, or Male] Above score calculated as 2 points each if present [Age > 75, or Stroke/TIA/TE]  I have discussed about anticoagulation with him, he said "I can't go every day and check my INR, I'm allergic to Xarelto and the other one is too damn pricey, I don't want to be on any, I am on Plavix. I think it will do it"   COPD Stable, no evidence of decompensation.    Procedures:  None  Consultations:  None  Discharge Exam: Filed Vitals:   05/12/15 0623  BP: 105/42  Pulse: 70  Temp:   Resp:    General: Alert and awake, oriented x3, not in any acute distress. HEENT: anicteric sclera, pupils reactive to light and accommodation, EOMI CVS: S1-S2 clear, no murmur rubs or gallops Chest: clear to auscultation bilaterally, no wheezing, rales or rhonchi Abdomen: soft nontender, nondistended, normal bowel sounds, no organomegaly Extremities: no cyanosis, clubbing or edema noted bilaterally Neuro: Cranial nerves II-XII intact, no focal neurological deficits  Discharge Instructions   Discharge Instructions  Diet - low sodium heart healthy  Complete by: As directed      Increase activity slowly  Complete by: As directed           Current Discharge Medication List    START taking these medications   Details  cephALEXin (KEFLEX) 500 MG capsule Take 1 capsule (500 mg total) by mouth 3 (three) times daily. Qty: 30 capsule, Refills: 0       CONTINUE these medications which have CHANGED   Details  doxycycline (VIBRA-TABS) 100 MG tablet Take 1 tablet (100 mg total) by mouth 2 (two) times daily. Qty: 20 tablet, Refills: 0      CONTINUE these medications which have NOT CHANGED   Details  acetaminophen (TYLENOL) 325 MG tablet Take 2 tablets (650 mg total) by mouth every 6 (six) hours as needed for mild pain, fever or headache (or Fever >/= 101).    aspirin 81 MG tablet Take 81 mg by mouth daily.    Cholecalciferol (EQL VITAMIN D3) 1000 UNITS tablet Take 1,000 Units by mouth daily.     clopidogrel (PLAVIX) 75 MG tablet Take 1 tablet (75 mg total) by mouth daily. Qty: 90 tablet, Refills: 3    furosemide (LASIX) 80 MG tablet Take 0.5 tablets (40 mg total) by mouth daily. Qty: 30 tablet, Refills: 0    gabapentin (NEURONTIN) 300 MG capsule Take 300 mg by mouth 2 (two) times daily.    glimepiride (AMARYL) 4 MG tablet Take 1 tablet (4 mg total) by mouth 2 (two) times daily. Qty: 180 tablet, Refills: 3    insulin lispro (HUMALOG KWIKPEN) 100 UNIT/ML KiwkPen Inject 5-20 Units into the skin 4 (four) times daily - after meals and at bedtime. Per sliding scale 100-120 per cbg    losartan (COZAAR) 100 MG tablet Take 1 tablet (100 mg total) by mouth daily. Qty: 90 tablet, Refills: 3    metoprolol (LOPRESSOR) 50 MG tablet Take 1 tablet (50 mg total) by mouth 2 (two) times daily. Qty: 180 tablet, Refills: 3    pioglitazone (ACTOS) 45 MG tablet Take 45 mg by mouth daily.     potassium chloride SA (K-DUR,KLOR-CON) 20 MEQ tablet Take 2 tablets (40 mEq total) by mouth daily. Qty: 90 tablet, Refills: 3    pravastatin (PRAVACHOL) 40 MG tablet Take 1 tablet (40 mg total) by mouth daily. Qty: 90 tablet, Refills: 3    silver sulfADIAZINE (SILVADENE) 1 % cream Apply 1 application topically daily. Qty: 50 g, Refills: 2    triamcinolone cream (KENALOG) 0.5 % Apply 1 application  topically 2 (two) times daily as needed (rash). Qty: 30 g, Refills: 3    vitamin B-12 (CYANOCOBALAMIN) 1000 MCG tablet Take 1,000 mcg by mouth daily.     nitroGLYCERIN (NITROSTAT) 0.4 MG SL tablet Place 0.4 mg under the tongue every 5 (five) minutes as needed. For chest pain    Spacer/Aero-Holding Chambers (BREATHERITE COLL SPACER ADULT) MISC Use w/proventil MDI Qty: 1 each, Refills: 1      STOP taking these medications     sulfamethoxazole-trimethoprim (BACTRIM DS,SEPTRA DS) 800-160 MG per tablet      albuterol (PROVENTIL HFA) 108 (90 BASE) MCG/ACT inhaler        Allergies  Allergen Reactions  . Xarelto [Rivaroxaban]     rash       The results of significant diagnostics from this hospitalization (including imaging, microbiology, ancillary and laboratory) are listed below for reference.    Significant Diagnostic Studies:  Imaging Results  No results found.    Microbiology: Recent Results (from the past 240 hour(s))  Blood culture (routine x 2) Status: None (Preliminary result)   Collection Time: 05/07/15 9:47 PM  Result Value Ref Range Status   Specimen Description BLOOD RIGHT HAND  Final   Special Requests BOTTLES DRAWN AEROBIC ONLY 3CC  Final   Culture   Final     BLOOD CULTURE RECEIVED NO GROWTH TO DATE CULTURE WILL BE HELD FOR 5 DAYS BEFORE ISSUING A FINAL NEGATIVE REPORT Performed at [facility]    Report Status PENDING  Incomplete  Blood culture (routine x 2) Status: None (Preliminary result)   Collection Time: 05/07/15 11:49 PM  Result Value Ref Range Status   Specimen Description BLOOD RIGHT HAND  Final   Special Requests BOTTLES DRAWN AEROBIC ONLY 2CCS  Final   Culture   Final     BLOOD CULTURE RECEIVED NO GROWTH TO DATE CULTURE WILL BE HELD FOR 5 DAYS BEFORE ISSUING A FINAL NEGATIVE REPORT Note: Culture results may be compromised due  to an inadequate volume of blood received in culture bottles. Performed at [facility]    Report Status PENDING  Incomplete  Urine culture Status: None   Collection Time: 05/08/15 12:42 AM  Result Value Ref Range Status   Specimen Description URINE, CLEAN CATCH  Final   Special Requests NONE  Final   Colony Count NO GROWTH Performed at [facility]   Final   Culture NO GROWTH Performed at [facility]   Final   Report Status 05/09/2015 FINAL  Final     Labs: Basic Metabolic Panel:  Last Labs      Recent Labs Lab 05/07/15 1551 05/08/15 0509 05/10/15 0442  NA 137 138 135  K 4.6 3.8 4.1  CL 101 102 100*  CO2 28 30 29   GLUCOSE 185* 119* 129*  BUN 19 18 13   CREATININE 0.89 0.90 0.99  CALCIUM 8.7* 8.5* 8.3*     Liver Function Tests:  Last Labs      Recent Labs Lab 05/07/15 1551  AST 17  ALT 14*  ALKPHOS 54  BILITOT 0.4  PROT 6.7  ALBUMIN 3.0*      Last Labs     No results for input(s): LIPASE, AMYLASE in the last 168 hours.    Last Labs     No results for input(s): AMMONIA in the last 168 hours.   CBC:  Last Labs      Recent Labs Lab 05/07/15 1551 05/08/15 0509  WBC 8.6 5.8  NEUTROABS 7.0 --   HGB 11.1* 10.1*  HCT 35.4* 32.1*  MCV 95.4 95.0  PLT 286 281     Cardiac Enzymes:  Last Labs     No results for input(s): CKTOTAL, CKMB, CKMBINDEX, TROPONINI in the last 168 hours.   BNP: BNP (last 3 results)  Recent Labs (within last 365 days)    No results for input(s): BNP in the last 8760 hours.    ProBNP (last 3 results)  Recent Labs (within last 365 days)     Recent Labs  07/25/14 1209 09/12/14 1136  PROBNP 1647.0* 132.0*      CBG:  Last Labs      Recent Labs Lab 05/10/15 2122 05/11/15 1200 05/11/15 1652 05/11/15 2155 05/12/15 0806  GLUCAP 123* 174* 171* 190* 108*                 Pt lost wt on a low carb diet -  lost 45 lbs and SOB has resolved   F/u SOB - inhalers help  The patient presents for a follow-up of  chronic hypertension, chronic dyslipidemia, type 2 diabetes controlled with medicines  F/u MI and CAP in 07/2014  Mr Weatherholtz wants to stop Xarelto due to cost and lawyers adds on TV and to go back on Plavix...  BP Readings from Last 3 Encounters:  05/20/15 130/66  05/19/15 130/58  05/12/15 122/50   Wt Readings from Last 3 Encounters:  05/20/15 340 lb (154.223 kg)  05/19/15 338 lb 12.8 oz (153.679 kg)  05/12/15 338 lb 10 oz (153.6 kg)     Review of Systems  Constitutional: Negative for appetite change, fatigue and unexpected weight change.  HENT: Negative for congestion, nosebleeds, sneezing, sore throat and trouble swallowing.   Eyes: Negative for itching and visual disturbance.  Respiratory: Negative for cough.   Cardiovascular: Negative for chest pain, palpitations and leg swelling.  Gastrointestinal: Negative for nausea, diarrhea, blood in stool and abdominal distention.  Genitourinary: Negative for frequency and hematuria.  Musculoskeletal: Negative for back pain, joint swelling, gait problem and neck pain.  Skin: Negative for rash.  Neurological: Negative for dizziness, tremors, speech difficulty and weakness.  Psychiatric/Behavioral: Negative for sleep disturbance, dysphoric mood and agitation. The patient is not nervous/anxious.        Objective:   Physical Exam  Constitutional: He is oriented to person, place, and time. He appears well-developed. No distress.  NAD  HENT:  Mouth/Throat: Oropharynx is clear and moist.  Eyes: Conjunctivae are normal. Pupils are equal, round, and reactive to light.  Neck: Normal range of motion. No JVD present. No thyromegaly present.  Cardiovascular: Normal rate, regular rhythm, normal heart sounds and intact distal pulses.  Exam reveals no gallop and no friction rub.   No murmur  heard. Pulmonary/Chest: Effort normal and breath sounds normal. No respiratory distress. He has no wheezes. He has no rales. He exhibits no tenderness.  Abdominal: Soft. Bowel sounds are normal. He exhibits no distension and no mass. There is no tenderness. There is no rebound and no guarding.  Musculoskeletal: Normal range of motion. He exhibits no edema or tenderness.  Lymphadenopathy:    He has no cervical adenopathy.  Neurological: He is alert and oriented to person, place, and time. He has normal reflexes. No cranial nerve deficit. He exhibits normal muscle tone. He displays a negative Romberg sign. Coordination and gait normal.  No meningeal signs  Skin: Skin is warm and dry. No rash noted.  Psychiatric: He has a normal mood and affect. His behavior is normal. Judgment and thought content normal.  dry skin on hands Obese - lost wt R foot w/resolving erythema  Lab Results  Component Value Date   WBC 7.3 05/19/2015   HGB 12.5* 05/19/2015   HCT 38.7* 05/19/2015   PLT 365.0 05/19/2015   GLUCOSE 67* 05/19/2015   CHOL 122 09/12/2014   TRIG 53.0 09/12/2014   HDL 40.90 09/12/2014   LDLCALC 71 09/12/2014   ALT 14* 05/07/2015   AST 17 05/07/2015   NA 138 05/19/2015   K 4.2 05/19/2015   CL 101 05/19/2015   CREATININE 0.85 05/19/2015   BUN 25* 05/19/2015   CO2 31 05/19/2015   TSH 1.19 04/18/2015   PSA 0.96 08/28/2010   INR 1.35 08/03/2014   HGBA1C 6.3 04/18/2015   MICROALBUR 1.4 08/28/2010         Assessment & Plan:  Patient ID:

## 2015-05-25 NOTE — Assessment & Plan Note (Signed)
Chronic  Glimepiride

## 2015-05-25 NOTE — Assessment & Plan Note (Signed)
6/16 inpt treatment R LE Finish po abx

## 2015-05-25 NOTE — Assessment & Plan Note (Signed)
Cont low carb diet

## 2015-06-09 ENCOUNTER — Other Ambulatory Visit: Payer: Self-pay

## 2015-06-20 ENCOUNTER — Other Ambulatory Visit: Payer: Self-pay | Admitting: Internal Medicine

## 2015-07-22 ENCOUNTER — Ambulatory Visit (INDEPENDENT_AMBULATORY_CARE_PROVIDER_SITE_OTHER): Payer: Medicare Other | Admitting: Internal Medicine

## 2015-07-22 ENCOUNTER — Encounter: Payer: Self-pay | Admitting: Internal Medicine

## 2015-07-22 VITALS — BP 110/60 | HR 57 | Wt 351.0 lb

## 2015-07-22 DIAGNOSIS — E538 Deficiency of other specified B group vitamins: Secondary | ICD-10-CM

## 2015-07-22 DIAGNOSIS — I2510 Atherosclerotic heart disease of native coronary artery without angina pectoris: Secondary | ICD-10-CM

## 2015-07-22 DIAGNOSIS — I10 Essential (primary) hypertension: Secondary | ICD-10-CM

## 2015-07-22 DIAGNOSIS — E11628 Type 2 diabetes mellitus with other skin complications: Secondary | ICD-10-CM

## 2015-07-22 DIAGNOSIS — E13628 Other specified diabetes mellitus with other skin complications: Secondary | ICD-10-CM

## 2015-07-22 DIAGNOSIS — E11319 Type 2 diabetes mellitus with unspecified diabetic retinopathy without macular edema: Secondary | ICD-10-CM

## 2015-07-22 DIAGNOSIS — E1165 Type 2 diabetes mellitus with hyperglycemia: Secondary | ICD-10-CM

## 2015-07-22 DIAGNOSIS — IMO0002 Reserved for concepts with insufficient information to code with codable children: Secondary | ICD-10-CM

## 2015-07-22 DIAGNOSIS — L03119 Cellulitis of unspecified part of limb: Secondary | ICD-10-CM

## 2015-07-22 MED ORDER — DOXYCYCLINE HYCLATE 100 MG PO TABS: 100.0000 mg | ORAL_TABLET | Freq: Two times a day (BID) | ORAL | Status: DC

## 2015-07-22 MED ORDER — FUROSEMIDE 80 MG PO TABS: 80.0000 mg | ORAL_TABLET | Freq: Every day | ORAL | Status: DC

## 2015-07-22 NOTE — Progress Notes (Signed)
Pre visit review using our clinic review tool, if applicable. No additional management support is needed unless otherwise documented below in the visit note. 

## 2015-07-22 NOTE — Assessment & Plan Note (Signed)
Doing well overall 

## 2015-07-22 NOTE — Assessment & Plan Note (Signed)
On B12 

## 2015-07-22 NOTE — Assessment & Plan Note (Signed)
On Rx: Losartan, Lopressor, Lasix

## 2015-07-22 NOTE — Assessment & Plan Note (Signed)
Glimepiride

## 2015-07-22 NOTE — Assessment & Plan Note (Signed)
On Doxy

## 2015-07-22 NOTE — Assessment & Plan Note (Signed)
On Rx: Losartan, Lopressor, Lasix, Pravachol, Plavix, ASA

## 2015-07-22 NOTE — Progress Notes (Signed)
Subjective:  Patient ID: Charles [name], male    DOB: 11-Jan-1941  Age: 74 y.o. MRN: [id number]  CC: No chief complaint on file.   HPI Charles [name] presents for DM, HTN, CAD f/u.  Outpatient Prescriptions Prior to Visit  Medication Sig Dispense Refill  . acetaminophen (TYLENOL) 325 MG tablet Take 2 tablets (650 mg total) by mouth every 6 (six) hours as needed for mild pain, fever or headache (or Fever >/= 101).    . ADVAIR DISKUS 250-50 MCG/DOSE AEPB Take 1 application by mouth daily.    Marland Kitchen albuterol (PROVENTIL HFA;VENTOLIN HFA) 108 (90 BASE) MCG/ACT inhaler Inhale 2 puffs into the lungs every 6 (six) hours as needed for wheezing or shortness of breath.    Marland Kitchen aspirin 81 MG tablet Take 81 mg by mouth daily.    . cephALEXin (KEFLEX) 500 MG capsule Take 1 capsule (500 mg total) by mouth 3 (three) times daily. 30 capsule 0  . Cholecalciferol (EQL VITAMIN D3) 1000 UNITS tablet Take 1,000 Units by mouth daily.      . clopidogrel (PLAVIX) 75 MG tablet Take 1 tablet (75 mg total) by mouth daily. 90 tablet 3  . doxycycline (VIBRA-TABS) 100 MG tablet Take 1 tablet (100 mg total) by mouth 2 (two) times daily. 20 tablet 0  . furosemide (LASIX) 80 MG tablet Take 1 tablet (80 mg total) by mouth daily. 30 tablet 11  . gabapentin (NEURONTIN) 300 MG capsule TAKE 1 CAPSULE THREE TIMES DAILY AS NEEDED FOR NERVE PAIN 270 capsule 1  . glimepiride (AMARYL) 4 MG tablet TAKE 1 TABLET TWICE DAILY 180 tablet 3  . insulin lispro (HUMALOG KWIKPEN) 100 UNIT/ML KiwkPen Inject 5-20 Units into the skin 4 (four) times daily - after meals and at bedtime. Per sliding scale 100-120 per cbg    . losartan (COZAAR) 100 MG tablet TAKE 1 TABLET EVERY DAY 90 tablet 3  . metoprolol (LOPRESSOR) 50 MG tablet TAKE 1 TABLET TWICE DAILY 180 tablet 3  . nitroGLYCERIN (NITROSTAT) 0.4 MG SL tablet Place 0.4 mg under the tongue every 5 (five) minutes as needed. For chest pain    . pioglitazone (ACTOS) 45 MG tablet TAKE 1 TABLET EVERY DAY 90  tablet 3  . potassium chloride SA (K-DUR,KLOR-CON) 20 MEQ tablet Take 2 tablets (40 mEq total) by mouth daily. 90 tablet 3  . pravastatin (PRAVACHOL) 40 MG tablet TAKE 1 TABLET DAILY. 90 tablet 3  . silver sulfADIAZINE (SILVADENE) 1 % cream Apply 1 application topically daily. 50 g 2  . Spacer/Aero-Holding Chambers (BREATHERITE COLL SPACER ADULT) MISC Use w/proventil MDI 1 each 1  . triamcinolone cream (KENALOG) 0.5 % Apply 1 application topically 2 (two) times daily as needed (rash). 30 g 3  . vitamin B-12 (CYANOCOBALAMIN) 1000 MCG tablet Take 1,000 mcg by mouth daily.       No facility-administered medications prior to visit.    ROS Review of Systems  Constitutional: Positive for unexpected weight change. Negative for appetite change and fatigue.  HENT: Negative for congestion, nosebleeds, sneezing, sore throat and trouble swallowing.   Eyes: Negative for itching and visual disturbance.  Respiratory: Negative for cough.   Cardiovascular: Positive for leg swelling. Negative for chest pain and palpitations.  Gastrointestinal: Negative for nausea, diarrhea, blood in stool and abdominal distention.  Genitourinary: Negative for frequency and hematuria.  Musculoskeletal: Positive for arthralgias. Negative for back pain, joint swelling, gait problem and neck pain.  Skin: Negative for rash.  Neurological: Negative for  dizziness, tremors, speech difficulty and weakness.  Psychiatric/Behavioral: Negative for suicidal ideas, sleep disturbance, dysphoric mood and agitation. The patient is not nervous/anxious.     Objective:  BP 110/60 mmHg  Pulse 57  Wt 351 lb (159.213 kg)  SpO2 94%  BP Readings from Last 3 Encounters:  07/22/15 110/60  05/20/15 130/66  05/19/15 130/58    Wt Readings from Last 3 Encounters:  07/22/15 351 lb (159.213 kg)  05/20/15 340 lb (154.223 kg)  05/19/15 338 lb 12.8 oz (153.679 kg)    Physical Exam  Constitutional: He is oriented to person, place, and time. He  appears well-developed. No distress.  NAD  HENT:  Mouth/Throat: Oropharynx is clear and moist.  Eyes: Conjunctivae are normal. Pupils are equal, round, and reactive to light.  Neck: Normal range of motion. No JVD present. No thyromegaly present.  Cardiovascular: Normal rate, regular rhythm, normal heart sounds and intact distal pulses.  Exam reveals no gallop and no friction rub.   No murmur heard. Pulmonary/Chest: Effort normal and breath sounds normal. No respiratory distress. He has no wheezes. He has no rales. He exhibits no tenderness.  Abdominal: Soft. Bowel sounds are normal. He exhibits no distension and no mass. There is no tenderness. There is no rebound and no guarding.  Musculoskeletal: Normal range of motion. He exhibits no edema or tenderness.  Lymphadenopathy:    He has no cervical adenopathy.  Neurological: He is alert and oriented to person, place, and time. He has normal reflexes. No cranial nerve deficit. He exhibits normal muscle tone. He displays a negative Romberg sign. Coordination and gait normal.  Skin: Skin is warm and dry. No rash noted.  Psychiatric: He has a normal mood and affect. His behavior is normal. Judgment and thought content normal.  Obese LE 1+ B Infected toe  Lab Results  Component Value Date   WBC 7.3 05/19/2015   HGB 12.5* 05/19/2015   HCT 38.7* 05/19/2015   PLT 365.0 05/19/2015   GLUCOSE 67* 05/19/2015   CHOL 122 09/12/2014   TRIG 53.0 09/12/2014   HDL 40.90 09/12/2014   LDLCALC 71 09/12/2014   ALT 14* 05/07/2015   AST 17 05/07/2015   NA 138 05/19/2015   K 4.2 05/19/2015   CL 101 05/19/2015   CREATININE 0.85 05/19/2015   BUN 25* 05/19/2015   CO2 31 05/19/2015   TSH 1.19 04/18/2015   PSA 0.96 08/28/2010   INR 1.35 08/03/2014   HGBA1C 6.3 04/18/2015   MICROALBUR 1.4 08/28/2010    No results found.  Assessment & Plan:   There are no diagnoses linked to this encounter. I am having Charles [name] maintain his vitamin B-12,  Cholecalciferol, nitroGLYCERIN, insulin lispro, acetaminophen, silver sulfADIAZINE, clopidogrel, potassium chloride SA, BREATHERITE COLL SPACER ADULT, triamcinolone cream, aspirin, doxycycline, cephALEXin, ADVAIR DISKUS, albuterol, furosemide, gabapentin, pioglitazone, losartan, metoprolol, glimepiride, and pravastatin.  No orders of the defined types were placed in this encounter.     Follow-up: No Follow-up on file.  [name], MD

## 2015-09-03 ENCOUNTER — Ambulatory Visit (INDEPENDENT_AMBULATORY_CARE_PROVIDER_SITE_OTHER): Payer: Medicare Other

## 2015-09-03 ENCOUNTER — Encounter: Payer: Self-pay | Admitting: Podiatry

## 2015-09-03 ENCOUNTER — Ambulatory Visit (INDEPENDENT_AMBULATORY_CARE_PROVIDER_SITE_OTHER): Payer: Medicare Other | Admitting: Podiatry

## 2015-09-03 VITALS — BP 129/61 | HR 58 | Temp 96.6°F | Resp 16

## 2015-09-03 DIAGNOSIS — B351 Tinea unguium: Secondary | ICD-10-CM

## 2015-09-03 DIAGNOSIS — Z5189 Encounter for other specified aftercare: Secondary | ICD-10-CM | POA: Diagnosis not present

## 2015-09-03 DIAGNOSIS — L02619 Cutaneous abscess of unspecified foot: Secondary | ICD-10-CM | POA: Diagnosis not present

## 2015-09-03 DIAGNOSIS — E1142 Type 2 diabetes mellitus with diabetic polyneuropathy: Secondary | ICD-10-CM

## 2015-09-03 DIAGNOSIS — I2510 Atherosclerotic heart disease of native coronary artery without angina pectoris: Secondary | ICD-10-CM

## 2015-09-03 DIAGNOSIS — M79606 Pain in leg, unspecified: Secondary | ICD-10-CM | POA: Diagnosis not present

## 2015-09-03 DIAGNOSIS — E11621 Type 2 diabetes mellitus with foot ulcer: Secondary | ICD-10-CM

## 2015-09-03 DIAGNOSIS — Q828 Other specified congenital malformations of skin: Secondary | ICD-10-CM

## 2015-09-03 DIAGNOSIS — L97509 Non-pressure chronic ulcer of other part of unspecified foot with unspecified severity: Secondary | ICD-10-CM

## 2015-09-03 DIAGNOSIS — L03119 Cellulitis of unspecified part of limb: Secondary | ICD-10-CM

## 2015-09-03 MED ORDER — CIPROFLOXACIN HCL 500 MG PO TABS: 500.0000 mg | ORAL_TABLET | Freq: Two times a day (BID) | ORAL | Status: DC

## 2015-09-03 NOTE — Progress Notes (Signed)
Subjective:     Patient ID: Charles [name], male   DOB: 1941/10/18, 74 y.o.   MRN: [id number]  HPI patient presents with caregiver with some irritation of the distal portion third toe right second toe left and states that his diabetic shoes from last year or not fitting properly. 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 Patient does have calcified arteries which are a complicating factor    Assessment:     Poor health individual with irritation of the distal right third toe with lesion formation and lesion on the second toe along with elongated nailbeds 2 through 5 left and 2 through 4 right that are thick and hard to cut. No indication currently of systemic infection    Plan:     H&P and x-ray reviewed of the right foot. At this time added Cipro to the doxycycline he's taken and I debrided lesions and applied padding to the right third toe with instructions on soaks. Dispensed surgical shoe that's much cleaner than the shoe he is wearing and we will get him approved for diabetic-type shoes. Patient weeks and is given strict instructions of any increased redness or any systemic signs of infection were to occur is to go straight to the emergency room and there is a distinct possibility that at one point in the future will be necessary. Patient also had x-ray reviewed today

## 2015-09-11 ENCOUNTER — Telehealth: Payer: Self-pay | Admitting: *Deleted

## 2015-09-11 MED ORDER — CIPROFLOXACIN HCL 500 MG PO TABS: 500.0000 mg | ORAL_TABLET | Freq: Two times a day (BID) | ORAL | Status: DC

## 2015-09-11 MED ORDER — DOXYCYCLINE HYCLATE 100 MG PO TABS: 100.0000 mg | ORAL_TABLET | Freq: Two times a day (BID) | ORAL | Status: DC

## 2015-09-11 NOTE — Telephone Encounter (Signed)
Pt's wife states pt's toe is still draining.  Dr. Paulla Dolly states refill the antibiotics as previously, Doxycycline 100mg  #28 one tablet bid, and Cipro 500mg  #20 one tablet bid, and have pt call for an appt or go to ER if symptoms worsen.

## 2015-09-26 ENCOUNTER — Ambulatory Visit (INDEPENDENT_AMBULATORY_CARE_PROVIDER_SITE_OTHER): Payer: Medicare Other | Admitting: Podiatry

## 2015-09-26 ENCOUNTER — Encounter: Payer: Self-pay | Admitting: Podiatry

## 2015-09-26 VITALS — BP 146/86 | HR 69 | Resp 12

## 2015-09-26 DIAGNOSIS — E1142 Type 2 diabetes mellitus with diabetic polyneuropathy: Secondary | ICD-10-CM

## 2015-09-26 DIAGNOSIS — L97509 Non-pressure chronic ulcer of other part of unspecified foot with unspecified severity: Secondary | ICD-10-CM

## 2015-09-26 DIAGNOSIS — I2510 Atherosclerotic heart disease of native coronary artery without angina pectoris: Secondary | ICD-10-CM

## 2015-09-26 DIAGNOSIS — L89891 Pressure ulcer of other site, stage 1: Secondary | ICD-10-CM

## 2015-09-26 DIAGNOSIS — E1140 Type 2 diabetes mellitus with diabetic neuropathy, unspecified: Secondary | ICD-10-CM | POA: Diagnosis not present

## 2015-09-26 DIAGNOSIS — E11621 Type 2 diabetes mellitus with foot ulcer: Secondary | ICD-10-CM

## 2015-09-29 NOTE — Progress Notes (Signed)
Subjective:     Patient ID: Charles [name], male   DOB: [birth year], 74 y.o.   MRN: [id number]  HPI patient states the right toe is doing better but I have a small opening on the left second toe wanted to get checked and I need diabetic shoes   Review of Systems     Objective:   Physical Exam Long-term diabetic with vascular and neurological disease that is followed who has lesion formation with no current proximal edema erythema or drainage noted but history of amputation    Assessment:     At risk diabetic with distal keratotic lesion digit 2 left localized in nature with no proximal edema erythema or drainage noted and long-term history of amputation    Plan:     Debrided small ulcer flushed the area and applied sterile dressing and went ahead today and scanned for diabetic shoes to try to reduce plantar pressure on his feet

## 2015-10-21 ENCOUNTER — Other Ambulatory Visit (INDEPENDENT_AMBULATORY_CARE_PROVIDER_SITE_OTHER): Payer: Medicare Other

## 2015-10-21 ENCOUNTER — Encounter: Payer: Self-pay | Admitting: Internal Medicine

## 2015-10-21 ENCOUNTER — Ambulatory Visit (INDEPENDENT_AMBULATORY_CARE_PROVIDER_SITE_OTHER): Payer: Medicare Other | Admitting: Internal Medicine

## 2015-10-21 VITALS — BP 122/76 | HR 56 | Wt 351.0 lb

## 2015-10-21 DIAGNOSIS — IMO0002 Reserved for concepts with insufficient information to code with codable children: Secondary | ICD-10-CM

## 2015-10-21 DIAGNOSIS — I10 Essential (primary) hypertension: Secondary | ICD-10-CM

## 2015-10-21 DIAGNOSIS — E113399 Type 2 diabetes mellitus with moderate nonproliferative diabetic retinopathy without macular edema, unspecified eye: Secondary | ICD-10-CM | POA: Diagnosis not present

## 2015-10-21 DIAGNOSIS — Z23 Encounter for immunization: Secondary | ICD-10-CM | POA: Diagnosis not present

## 2015-10-21 DIAGNOSIS — I2510 Atherosclerotic heart disease of native coronary artery without angina pectoris: Secondary | ICD-10-CM

## 2015-10-21 DIAGNOSIS — E1165 Type 2 diabetes mellitus with hyperglycemia: Secondary | ICD-10-CM | POA: Diagnosis not present

## 2015-10-21 DIAGNOSIS — E11319 Type 2 diabetes mellitus with unspecified diabetic retinopathy without macular edema: Secondary | ICD-10-CM | POA: Diagnosis not present

## 2015-10-21 DIAGNOSIS — H1132 Conjunctival hemorrhage, left eye: Secondary | ICD-10-CM

## 2015-10-21 DIAGNOSIS — H1130 Conjunctival hemorrhage, unspecified eye: Secondary | ICD-10-CM | POA: Insufficient documentation

## 2015-10-21 LAB — BASIC METABOLIC PANEL
BUN: 30 mg/dL — ABNORMAL HIGH (ref 6–23)
CHLORIDE: 100 meq/L (ref 96–112)
CO2: 29 meq/L (ref 19–32)
Calcium: 9.4 mg/dL (ref 8.4–10.5)
Creatinine, Ser: 1.14 mg/dL (ref 0.40–1.50)
GFR: 66.71 mL/min (ref >60.00)
GLUCOSE: 120 mg/dL — AB (ref 70–99)
Potassium: 4.2 mEq/L (ref 3.5–5.1)
Sodium: 138 mEq/L (ref 135–145)

## 2015-10-21 LAB — HEMOGLOBIN A1C: Hgb A1c MFr Bld: 5.8 % (ref 4.6–6.5)

## 2015-10-21 NOTE — Progress Notes (Signed)
Pre visit review using our clinic review tool, if applicable. No additional management support is needed unless otherwise documented below in the visit note. 

## 2015-10-21 NOTE — Assessment & Plan Note (Signed)
Labs On Glimepiride  

## 2015-10-21 NOTE — Assessment & Plan Note (Signed)
Will watch 

## 2015-10-21 NOTE — Assessment & Plan Note (Signed)
On Rx: Losartan, Lopressor, Lasix Labs

## 2015-10-21 NOTE — Progress Notes (Signed)
Subjective:  Patient ID: RAVINDER LUKEHART, male    DOB: 03/17/1941  Age: 74 y.o. MRN: [id number]  CC: No chief complaint on file.   HPI JOHNATHA ZEIDMAN presents for DM, asthma, HTN, neuropathy f/u  Outpatient Prescriptions Prior to Visit  Medication Sig Dispense Refill  . acetaminophen (TYLENOL) 325 MG tablet Take 2 tablets (650 mg total) by mouth every 6 (six) hours as needed for mild pain, fever or headache (or Fever >/= 101).    . ADVAIR DISKUS 250-50 MCG/DOSE AEPB Take 1 application by mouth daily.    Marland Kitchen albuterol (PROVENTIL HFA;VENTOLIN HFA) 108 (90 BASE) MCG/ACT inhaler Inhale 2 puffs into the lungs every 6 (six) hours as needed for wheezing or shortness of breath.    Marland Kitchen aspirin 81 MG tablet Take 81 mg by mouth daily.    . cephALEXin (KEFLEX) 500 MG capsule Take 1 capsule (500 mg total) by mouth 3 (three) times daily. 30 capsule 0  . Cholecalciferol (EQL VITAMIN D3) 1000 UNITS tablet Take 1,000 Units by mouth daily.      . ciprofloxacin (CIPRO) 500 MG tablet Take 1 tablet (500 mg total) by mouth 2 (two) times daily. 20 tablet 0  . clopidogrel (PLAVIX) 75 MG tablet Take 1 tablet (75 mg total) by mouth daily. 90 tablet 3  . doxycycline (VIBRA-TABS) 100 MG tablet Take 1 tablet (100 mg total) by mouth 2 (two) times daily. 28 tablet 0  . furosemide (LASIX) 80 MG tablet Take 1 tablet (80 mg total) by mouth daily. 90 tablet 3  . gabapentin (NEURONTIN) 300 MG capsule TAKE 1 CAPSULE THREE TIMES DAILY AS NEEDED FOR NERVE PAIN 270 capsule 1  . glimepiride (AMARYL) 4 MG tablet TAKE 1 TABLET TWICE DAILY 180 tablet 3  . insulin lispro (HUMALOG KWIKPEN) 100 UNIT/ML KiwkPen Inject 5-20 Units into the skin 4 (four) times daily - after meals and at bedtime. Per sliding scale 100-120 per cbg    . losartan (COZAAR) 100 MG tablet TAKE 1 TABLET EVERY DAY 90 tablet 3  . metoprolol (LOPRESSOR) 50 MG tablet TAKE 1 TABLET TWICE DAILY 180 tablet 3  . nitroGLYCERIN (NITROSTAT) 0.4 MG SL tablet Place 0.4 mg under  the tongue every 5 (five) minutes as needed. For chest pain    . pioglitazone (ACTOS) 45 MG tablet TAKE 1 TABLET EVERY DAY 90 tablet 3  . potassium chloride SA (K-DUR,KLOR-CON) 20 MEQ tablet Take 2 tablets (40 mEq total) by mouth daily. 90 tablet 3  . pravastatin (PRAVACHOL) 40 MG tablet TAKE 1 TABLET DAILY. 90 tablet 3  . silver sulfADIAZINE (SILVADENE) 1 % cream Apply 1 application topically daily. 50 g 2  . Spacer/Aero-Holding Chambers (BREATHERITE COLL SPACER ADULT) MISC Use w/proventil MDI 1 each 1  . triamcinolone cream (KENALOG) 0.5 % Apply 1 application topically 2 (two) times daily as needed (rash). 30 g 3  . vitamin B-12 (CYANOCOBALAMIN) 1000 MCG tablet Take 1,000 mcg by mouth daily.       No facility-administered medications prior to visit.    ROS Review of Systems  Constitutional: Negative for appetite change, fatigue and unexpected weight change.  HENT: Negative for congestion, nosebleeds, sneezing, sore throat and trouble swallowing.   Eyes: Positive for pain. Negative for itching and visual disturbance.  Respiratory: Negative for cough.   Cardiovascular: Positive for palpitations and leg swelling. Negative for chest pain.  Gastrointestinal: Negative for nausea, diarrhea, blood in stool and abdominal distention.  Genitourinary: Negative for frequency and hematuria.  Musculoskeletal: Positive for arthralgias and gait problem. Negative for back pain, joint swelling and neck pain.  Skin: Negative for rash.  Neurological: Negative for dizziness, tremors, speech difficulty and weakness.  Psychiatric/Behavioral: Negative for sleep disturbance, dysphoric mood and agitation. The patient is not nervous/anxious.     Objective:  BP 122/76 mmHg  Pulse 56  Wt 351 lb (159.213 kg)  SpO2 93%  BP Readings from Last 3 Encounters:  10/21/15 122/76  09/26/15 146/86  09/03/15 129/61    Wt Readings from Last 3 Encounters:  10/21/15 351 lb (159.213 kg)  07/22/15 351 lb (159.213 kg)    05/20/15 340 lb (154.223 kg)    Physical Exam  Constitutional: He is oriented to person, place, and time. He appears well-developed. No distress.  NAD  HENT:  Mouth/Throat: Oropharynx is clear and moist.  Eyes: Conjunctivae are normal. Pupils are equal, round, and reactive to light.  Neck: Normal range of motion. No JVD present. No thyromegaly present.  Cardiovascular: Normal rate, regular rhythm, normal heart sounds and intact distal pulses.  Exam reveals no gallop and no friction rub.   No murmur heard. Pulmonary/Chest: Effort normal and breath sounds normal. No respiratory distress. He has no wheezes. He has no rales. He exhibits no tenderness.  Abdominal: Soft. Bowel sounds are normal. He exhibits no distension and no mass. There is no tenderness. There is no rebound and no guarding.  Musculoskeletal: Normal range of motion. He exhibits no edema or tenderness.  Lymphadenopathy:    He has no cervical adenopathy.  Neurological: He is alert and oriented to person, place, and time. He has normal reflexes. No cranial nerve deficit. He exhibits normal muscle tone. He displays a negative Romberg sign. Coordination and gait normal.  Skin: Skin is warm and dry. No rash noted.  Psychiatric: He has a normal mood and affect. His behavior is normal. Judgment and thought content normal.  Obese L eye is red w/subconj hemorrhage B trace edema   Lab Results  Component Value Date   WBC 7.3 05/19/2015   HGB 12.5* 05/19/2015   HCT 38.7* 05/19/2015   PLT 365.0 05/19/2015   GLUCOSE 120* 10/21/2015   CHOL 122 09/12/2014   TRIG 53.0 09/12/2014   HDL 40.90 09/12/2014   LDLCALC 71 09/12/2014   ALT 14* 05/07/2015   AST 17 05/07/2015   NA 138 10/21/2015   K 4.2 10/21/2015   CL 100 10/21/2015   CREATININE 1.14 10/21/2015   BUN 30* 10/21/2015   CO2 29 10/21/2015   TSH 1.19 04/18/2015   PSA 0.96 08/28/2010   INR 1.35 08/03/2014   HGBA1C 5.8 10/21/2015   MICROALBUR 1.4 08/28/2010    No  results found.  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 Vangorder maintain his vitamin B-12, Cholecalciferol, nitroGLYCERIN, insulin lispro, acetaminophen, silver sulfADIAZINE, clopidogrel, potassium chloride SA, BREATHERITE COLL SPACER ADULT, triamcinolone cream, aspirin, cephALEXin, ADVAIR DISKUS, albuterol, gabapentin, pioglitazone, losartan, metoprolol, glimepiride, pravastatin, furosemide, ciprofloxacin, and doxycycline.  No orders of the defined types were placed in this encounter.     Follow-up: Return in about  3 months (around 01/21/2016) for a follow-up visit.  [name], MD

## 2015-10-22 ENCOUNTER — Ambulatory Visit (INDEPENDENT_AMBULATORY_CARE_PROVIDER_SITE_OTHER): Payer: Medicare Other | Admitting: Podiatry

## 2015-10-22 ENCOUNTER — Encounter: Payer: Self-pay | Admitting: Podiatry

## 2015-10-22 VITALS — Temp 97.6°F

## 2015-10-22 DIAGNOSIS — Z89412 Acquired absence of left great toe: Secondary | ICD-10-CM

## 2015-10-22 DIAGNOSIS — E11621 Type 2 diabetes mellitus with foot ulcer: Secondary | ICD-10-CM | POA: Diagnosis not present

## 2015-10-22 DIAGNOSIS — M2041 Other hammer toe(s) (acquired), right foot: Secondary | ICD-10-CM

## 2015-10-22 DIAGNOSIS — L97509 Non-pressure chronic ulcer of other part of unspecified foot with unspecified severity: Principal | ICD-10-CM

## 2015-10-22 DIAGNOSIS — E1142 Type 2 diabetes mellitus with diabetic polyneuropathy: Secondary | ICD-10-CM

## 2015-10-22 DIAGNOSIS — S98112A Complete traumatic amputation of left great toe, initial encounter: Secondary | ICD-10-CM

## 2015-10-22 DIAGNOSIS — M2042 Other hammer toe(s) (acquired), left foot: Secondary | ICD-10-CM

## 2015-10-22 DIAGNOSIS — L89891 Pressure ulcer of other site, stage 1: Secondary | ICD-10-CM | POA: Diagnosis not present

## 2015-10-22 DIAGNOSIS — E1140 Type 2 diabetes mellitus with diabetic neuropathy, unspecified: Secondary | ICD-10-CM

## 2015-10-22 NOTE — Patient Instructions (Signed)

## 2015-10-22 NOTE — Progress Notes (Signed)
Patient ID: Charles [name], male   DOB: 07-28-1941, 74 y.o.   MRN: [id number] Patient presents for diabetic shoe pick up, shoes are tried on for good fit.  Patient received 1 Pair Apex 1260M Conform double strap in men's 12.5 wide and 3 pairs custom molded diabetic inserts with deep pocket left 1st met head and left 1st hallux filler.  Verbal and written break in and wear instructions given.  Patient will follow up for scheduled routine care. Also evaluated both feet and found that the lesions are improving there still a slight bit of breakdown of tissue measuring 3 x 3 mm plantar left first metatarsal but it's localized with no proximal edema erythema or drainage noted. I debrided the ulcerated tissue did not note any subcutaneous exposure and applied a small amount of Iodosorb with padding and gave him padding to use. He will begin his new diabetic shoes we'll hopefully allow this to finish healing and is given strict instructions of any redness swelling or drainage were to occur to come in immediately or to go to the emergency room. Understands chances for amputation are always present

## 2015-11-24 ENCOUNTER — Ambulatory Visit (INDEPENDENT_AMBULATORY_CARE_PROVIDER_SITE_OTHER): Payer: Medicare Other

## 2015-11-24 ENCOUNTER — Ambulatory Visit (INDEPENDENT_AMBULATORY_CARE_PROVIDER_SITE_OTHER): Payer: Medicare Other | Admitting: Podiatry

## 2015-11-24 ENCOUNTER — Encounter: Payer: Self-pay | Admitting: Podiatry

## 2015-11-24 VITALS — BP 93/45 | HR 60 | Resp 99

## 2015-11-24 DIAGNOSIS — L89891 Pressure ulcer of other site, stage 1: Secondary | ICD-10-CM | POA: Diagnosis not present

## 2015-11-24 DIAGNOSIS — E11621 Type 2 diabetes mellitus with foot ulcer: Secondary | ICD-10-CM

## 2015-11-24 DIAGNOSIS — L03119 Cellulitis of unspecified part of limb: Secondary | ICD-10-CM

## 2015-11-24 DIAGNOSIS — L02619 Cutaneous abscess of unspecified foot: Secondary | ICD-10-CM

## 2015-11-24 DIAGNOSIS — M79674 Pain in right toe(s): Secondary | ICD-10-CM

## 2015-11-24 DIAGNOSIS — L97509 Non-pressure chronic ulcer of other part of unspecified foot with unspecified severity: Secondary | ICD-10-CM

## 2015-11-24 MED ORDER — DOXYCYCLINE HYCLATE 100 MG PO TABS: 100.0000 mg | ORAL_TABLET | Freq: Two times a day (BID) | ORAL | Status: DC

## 2015-11-27 ENCOUNTER — Ambulatory Visit: Payer: Medicare Other | Admitting: Internal Medicine

## 2015-11-27 NOTE — Progress Notes (Signed)
Subjective:     Patient ID: Charles [name], male   DOB: 1941-05-02, 74 y.o.   MRN: [id number]  HPI poor health individual with obesity is complicating factor and long-term diabetes fluid minutes with his caregiver that he was extremely active on his feet for 2 full days and developed some breakdown of the second toe again and admits that he did not wear his pad as he was supposed   Review of Systems     Objective:   Physical Exam Neurovascular status unchanged with distal keratotic lesion digit 2 right localized in nature with slight distal drainage and keratotic lesion third digit with no proximal edema erythema drainage noted at the current time    Assessment:     Localized breakdown of tissue distal second right which does occur with noncompliance as he wore shoes consistently for 2 days without his buttress pad and this occurred in the last week    Plan:     Reviewed with him and his caregiver the importance of local care and wearing devices to try to keep pressure off the toes. At this time debrided tissue found to be local and as precautionary measure placed him on doxycycline and gave strict instructions if any proximal edema erythema or any systemic signs of infection were to occur he is to go straight to the emergency room. I did discuss with him again that it is very possible that one point in future that he is can require amputations of these digits if they become chronically infected or if there is indications of any cellulitic event and it's very difficult to tell currently whether or not there may be distal bone infection. Reviewed x-rays with patient

## 2015-12-01 ENCOUNTER — Telehealth: Payer: Self-pay | Admitting: *Deleted

## 2015-12-01 NOTE — Telephone Encounter (Signed)
Pt's wife, Joaquim Lai states pt saw Dr. Paulla Dolly last week for an infected toe, now the 2nd toe is infected and looks worse, pt is on Doxycycline.  I told Joaquim Lai I felt pt should be seen and she agreed.  Transferred to Scheduler for an appt tomorrow, and Joaquim Lai states that will be good.

## 2015-12-02 ENCOUNTER — Ambulatory Visit (INDEPENDENT_AMBULATORY_CARE_PROVIDER_SITE_OTHER): Payer: Medicare Other | Admitting: Podiatry

## 2015-12-02 ENCOUNTER — Encounter: Payer: Self-pay | Admitting: Podiatry

## 2015-12-02 VITALS — BP 157/71 | HR 65 | Resp 18

## 2015-12-02 DIAGNOSIS — L97509 Non-pressure chronic ulcer of other part of unspecified foot with unspecified severity: Secondary | ICD-10-CM | POA: Diagnosis not present

## 2015-12-02 DIAGNOSIS — I2510 Atherosclerotic heart disease of native coronary artery without angina pectoris: Secondary | ICD-10-CM

## 2015-12-02 DIAGNOSIS — E11621 Type 2 diabetes mellitus with foot ulcer: Secondary | ICD-10-CM | POA: Diagnosis not present

## 2015-12-02 DIAGNOSIS — L97512 Non-pressure chronic ulcer of other part of right foot with fat layer exposed: Secondary | ICD-10-CM

## 2015-12-02 DIAGNOSIS — L97529 Non-pressure chronic ulcer of other part of left foot with unspecified severity: Secondary | ICD-10-CM

## 2015-12-02 DIAGNOSIS — L03115 Cellulitis of right lower limb: Secondary | ICD-10-CM

## 2015-12-02 MED ORDER — CIPROFLOXACIN HCL 500 MG PO TABS: 500.0000 mg | ORAL_TABLET | Freq: Two times a day (BID) | ORAL | Status: DC

## 2015-12-02 MED ORDER — CLINDAMYCIN HCL 300 MG PO CAPS: 300.0000 mg | ORAL_CAPSULE | Freq: Three times a day (TID) | ORAL | Status: DC

## 2015-12-04 NOTE — Progress Notes (Signed)
Patient ID: Charles [name], male   DOB: Dec 07, 1941, 74 y.o.   MRN: [id number]  Subjective: 74 year old male presents the office today for follow up evaluation of wounds and cellulitis the right second and third toes. They will last seen by Dr.  Paulla Dolly on 11/24/2015. There are certainly antibiotics, doxycycline but despite antibiotics there is an increased redness and swelling to the right foot. They state that they are aware that he may lose , amputate the digits tingling at the wound an infection worsens. He denies any systemic complaints as fevers, chills, nausea, vomiting. No calf pain, chest pain, status of breath. No other complaints at this time.  Objective: AAO 3, NAD DP/PT pulses palpable Protective sensation decreased with Simms Weinstein monofilament The distal aspect of the right second digit is a fibrotic wound measuring a proximal 0.4 x 0.4 cm. There does probe however does not probe to bone although close. No undermining or tunneling. There is epidermal lysis of the right third toe the entire digit with a granular wound base. There is erythema, cellulitis to the digits extending to the dorsal aspect of the right midfoot. His is on extend past the ankle. There is no ascending cellulitis. No pain or restriction to ankle or subtalar joint range of motion. There is no areas of fluctuance or crepitus. No malodor. No drainage. There is edema to the right foot as well as an increase in warmth. On the left foot submetatarsal one is a hyperkeratotic lesion with underlying ulceration measuring 0.6 x 0.6 cm. There is no probing, undermining, tunneling. No swelling erythema, ascending cellulitis, fluctuance, crepitus, malodor, drainage. No other open lesions or pre-ulcer lesions identified this time. No pain with calf compression, swelling, warmth, erythema.  Assessment:  74 year old male right foot cellulitis   Plan: -Treatment options discussed including all alternatives, risks, and  complications - And a long discussion with the patient and his wife in regards the infection that he is a high risk of limb loss. Given the worsening synovitis will changes and a box to clindamycin and ciprofloxacin. There is any worsening signs or symptoms of infection he is to go immediately to the emergency room. Continue with daily dressing changes  To both the right second toe and left submetatarsal wound with Silvadene daily. Surgical shoe right foot.. -Follow-up in 1 week or sooner if any problems arise. In the meantime, encouraged to call the office with any questions, concerns, change in symptoms.  *X-ray bilateral feet next appointment  Celesta Gentile, DPM

## 2015-12-08 ENCOUNTER — Ambulatory Visit (INDEPENDENT_AMBULATORY_CARE_PROVIDER_SITE_OTHER): Payer: Medicare Other | Admitting: Podiatry

## 2015-12-08 ENCOUNTER — Ambulatory Visit (INDEPENDENT_AMBULATORY_CARE_PROVIDER_SITE_OTHER): Payer: Medicare Other

## 2015-12-08 ENCOUNTER — Encounter: Payer: Self-pay | Admitting: Podiatry

## 2015-12-08 VITALS — BP 119/71 | HR 69 | Temp 98.0°F | Resp 16

## 2015-12-08 DIAGNOSIS — E11621 Type 2 diabetes mellitus with foot ulcer: Secondary | ICD-10-CM

## 2015-12-08 DIAGNOSIS — L03119 Cellulitis of unspecified part of limb: Secondary | ICD-10-CM

## 2015-12-08 DIAGNOSIS — L02619 Cutaneous abscess of unspecified foot: Secondary | ICD-10-CM

## 2015-12-08 DIAGNOSIS — M79671 Pain in right foot: Secondary | ICD-10-CM

## 2015-12-08 DIAGNOSIS — M79672 Pain in left foot: Secondary | ICD-10-CM

## 2015-12-08 DIAGNOSIS — I2510 Atherosclerotic heart disease of native coronary artery without angina pectoris: Secondary | ICD-10-CM | POA: Diagnosis not present

## 2015-12-08 DIAGNOSIS — L97509 Non-pressure chronic ulcer of other part of unspecified foot with unspecified severity: Secondary | ICD-10-CM

## 2015-12-08 MED ORDER — CIPROFLOXACIN HCL 500 MG PO TABS: 500.0000 mg | ORAL_TABLET | Freq: Two times a day (BID) | ORAL | Status: DC

## 2015-12-08 NOTE — Progress Notes (Signed)
Subjective:     Patient ID: Charles [name], male   DOB: 01/02/1941, 74 y.o.   MRN: [id number]  HPI patient presents stating that his foot is feeling a little bit better on the right but it still hurts at times but the last 24 hours has been improved and the redness seems to be better than it was and his sugar has been running normal. States the second and third toes do not look good especially the third   Review of Systems     Objective:   Physical Exam Neurovascular status unchanged with patient noted to have discoloration third digit right with significant change since I saw him several weeks ago and irritation of the distal second toe right foot with mild redness in the forefoot right that is reduced from previous visit with no proximal lymph node distention or indications of cellulitic event. The left foot plantar first metatarsal appears to be doing well with no drainage odor noted and there was no current drainage in the right foot but quite a bit of crusted tissue on the third toe that has as we repeated earlier worsened over the last several weeks    Assessment:     What appears to be a previous cellulitic process right foot which appears to be resolving with a third toe which at this time appears to be necrotic and irritation of the distal tip second toe right with no current odor or drainage noted. The left plantar forefoot appears stable    Plan:     I spent a great deal of time with him and his caregiver discussing the situation and the fact that amputation is going to be necessary but I am hopeful that we can just do the third digit and possibly the distal portion of the second digit. We are trying to resolve infection and we will continue with the antibiotic regimen of Cipro and Cleocin that the patient is on and patient will be seen back in 1 week for reevaluation by myself and Dr. Jacqualyn Posey and was given strict instructions of any increased redness drainage or any indications of  systemic infection were to occur the patient is to go straight to the emergency room. X-rays evaluated today appear stable

## 2015-12-17 ENCOUNTER — Ambulatory Visit (INDEPENDENT_AMBULATORY_CARE_PROVIDER_SITE_OTHER): Payer: Medicare Other | Admitting: Podiatry

## 2015-12-17 DIAGNOSIS — E11621 Type 2 diabetes mellitus with foot ulcer: Secondary | ICD-10-CM

## 2015-12-17 DIAGNOSIS — L97509 Non-pressure chronic ulcer of other part of unspecified foot with unspecified severity: Secondary | ICD-10-CM

## 2015-12-17 DIAGNOSIS — L97512 Non-pressure chronic ulcer of other part of right foot with fat layer exposed: Secondary | ICD-10-CM | POA: Diagnosis not present

## 2015-12-17 MED ORDER — CIPROFLOXACIN HCL 500 MG PO TABS: 500.0000 mg | ORAL_TABLET | Freq: Two times a day (BID) | ORAL | Status: DC

## 2015-12-17 NOTE — Patient Instructions (Signed)
Pre-Operative Instructions  Congratulations, you have decided to take an important step to improving your quality of life.  You can be assured that the doctors of [facility] will be with you every step of the way.  1. Plan to be at the surgery center/hospital at least 1 (one) hour prior to your scheduled time unless otherwise directed by the surgical center/hospital staff.  You must have a responsible adult accompany you, remain during the surgery and drive you home.  Make sure you have directions to the surgical center/hospital and know how to get there on time. 2. For hospital based surgery you will need to obtain a history and physical form from your family physician within 1 month prior to the date of surgery- we will give you a form for you primary physician.  3. We make every effort to accommodate the date you request for surgery.  There are however, times where surgery dates or times have to be moved.  We will contact you as soon as possible if a change in schedule is required.   4. No Aspirin/Ibuprofen for one week before surgery.  If you are on aspirin, any non-steroidal anti-inflammatory medications (Mobic, Aleve, Ibuprofen) you should stop taking it 7 days prior to your surgery.  You make take Tylenol  For pain prior to surgery.  5. Medications- If you are taking daily heart and blood pressure medications, seizure, reflux, allergy, asthma, anxiety, pain or diabetes medications, make sure the surgery center/hospital is aware before the day of surgery so they may notify you which medications to take or avoid the day of surgery. 6. No food or drink after midnight the night before surgery unless directed otherwise by surgical center/hospital staff. 7. No alcoholic beverages 24 hours prior to surgery.  No smoking 24 hours prior to or 24 hours after surgery. 8. Wear loose pants or shorts- loose enough to fit over bandages, boots, and casts. 9. No slip on shoes, sneakers are best. 10. Bring  your boot with you to the surgery center/hospital.  Also bring crutches or a walker if your physician has prescribed it for you.  If you do not have this equipment, it will be provided for you after surgery. 11. If you have not been contracted by the surgery center/hospital by the day before your surgery, call to confirm the date and time of your surgery. 12. Leave-time from work may vary depending on the type of surgery you have.  Appropriate arrangements should be made prior to surgery with your employer. 13. Prescriptions will be provided immediately following surgery by your doctor.  Have these filled as soon as possible after surgery and take the medication as directed. 14. Remove nail polish on the operative foot. 15. Wash the night before surgery.  The night before surgery wash the foot and leg well with the antibacterial soap provided and water paying special attention to beneath the toenails and in between the toes.  Rinse thoroughly with water and dry well with a towel.  Perform this wash unless told not to do so by your physician.  Enclosed: 1 Ice pack (please put in freezer the night before surgery)   1 Hibiclens skin cleaner   Pre-op Instructions  If you have any questions regarding the instructions, do not hesitate to call our office.  [place]: [address] [place], [place] [address] [phone]  [place]: [address] [place], [place] [address] [phone]  [place]: [address]  [place], [place] [address] [phone]  Dr. Richard   Tuchman DPM, Dr. Dustina Scoggin DPM Dr. Richard Sikora DPM, Dr. M. Todd Hyatt DPM, Dr. Kathryn Egerton DPM 

## 2015-12-22 NOTE — Progress Notes (Signed)
Subjective:     Patient ID: Charles [name], male   DOB: [birth year], 75 y.o.   MRN: [id number]  HPI patient states the redness is gone down quite a bit in my right foot but I know that I'm getting need to have this toe taken off as it's turned black   Review of Systems     Objective:   Physical Exam Neurovascular status unchanged with necrotic third digit right foot that is extending proximal to the proximal interphalangeal joint. The distal second toe does have breakdown in tissue but the proximal portion of the toe at this time appears relatively healthy area patient's proximal tissue does not show erythema edema or drainage currently    Assessment:     Gangrene third digit right foot with probable distal infection second digit right foot    Plan:     Reviewed both conditions and at this point unfortunately Amputation of the third digit right and at least a portion of the second digit right is necessary. Reviewed this case with Dr. Jacqualyn Posey and we are both in agreement of what needs to be done and I reviewed this with Charles [name] and his caregiver explaining the procedure and the risk and alternative treatments complications. They understand that it is possible that he may require further amputation the future and ultimately could lose his right leg. They're willing to accept risk and her hopeful that by removing the toe and the second toe or part of it that this will stop the process and hopefully allow stabilization. Consent form is signed and patient is scheduled next week by myself or Dr. Jacqualyn Posey to perform the amputation of third toe and probable second toe right foot

## 2015-12-24 DIAGNOSIS — I10 Essential (primary) hypertension: Secondary | ICD-10-CM | POA: Diagnosis not present

## 2015-12-24 DIAGNOSIS — L97509 Non-pressure chronic ulcer of other part of unspecified foot with unspecified severity: Secondary | ICD-10-CM | POA: Diagnosis not present

## 2015-12-24 DIAGNOSIS — I96 Gangrene, not elsewhere classified: Secondary | ICD-10-CM | POA: Diagnosis not present

## 2015-12-24 DIAGNOSIS — M86179 Other acute osteomyelitis, unspecified ankle and foot: Secondary | ICD-10-CM | POA: Diagnosis not present

## 2015-12-24 DIAGNOSIS — M86171 Other acute osteomyelitis, right ankle and foot: Secondary | ICD-10-CM | POA: Diagnosis not present

## 2015-12-24 DIAGNOSIS — M25871 Other specified joint disorders, right ankle and foot: Secondary | ICD-10-CM | POA: Diagnosis not present

## 2015-12-24 HISTORY — PX: TOE AMPUTATION: SHX809

## 2015-12-25 ENCOUNTER — Telehealth: Payer: Self-pay | Admitting: *Deleted

## 2015-12-25 NOTE — Telephone Encounter (Addendum)
Pt's wife, Charles [name] states Dr. Jacqualyn [name] wanted pt to be seen in office 12/26/2015.  I spoke with Charles [name] and she said pt is to be seen by the nurse tomorrow, is that okay?  I told Charles [name], the nurse would do the preliminary x-rays and dressing removal, and a doctor will see pt before he leaves.  Charles [name] also said pt would like to switch his doctor to Dr. Jacqualyn [name], because he is more like Dr. Blenda [name].  I told her that would be fine and inform Dr. Jacqualyn [name] as well as Dr. Paulla [name].  12/26/2015 - Pt's wife, Charles [name] states pt's blood sugars have not been above 89 even with fruit smoothies and 2 tablespoons of icing.  Marylou Mccoy, RN triaged and Dr. Paulla [name] checked pt and his right surgical foot.  Gretta Arab, RN rewrapped with ace wraps looser and Dr. Paulla [name] stated to keep surgical dressing in place until Monday and go to ER.  Pt refused to allow EMT unit to pick up and was driven by Charles [name] to [facility] ER.  I called ER and informed of pt's destination by POV.

## 2015-12-26 ENCOUNTER — Emergency Department (HOSPITAL_COMMUNITY): Payer: Medicare Other

## 2015-12-26 ENCOUNTER — Ambulatory Visit: Payer: Medicare Other

## 2015-12-26 ENCOUNTER — Inpatient Hospital Stay (HOSPITAL_COMMUNITY)
Admission: EM | Admit: 2015-12-26 | Discharge: 2015-12-29 | DRG: 638 | Disposition: A | Discharge status: Home / Self Care | Payer: Medicare Other | Attending: Internal Medicine | Admitting: Internal Medicine

## 2015-12-26 ENCOUNTER — Encounter (HOSPITAL_COMMUNITY): Payer: Self-pay

## 2015-12-26 ENCOUNTER — Ambulatory Visit (INDEPENDENT_AMBULATORY_CARE_PROVIDER_SITE_OTHER): Payer: Medicare Other

## 2015-12-26 VITALS — BP 98/42 | HR 75 | Temp 96.9°F | Resp 16

## 2015-12-26 DIAGNOSIS — I10 Essential (primary) hypertension: Secondary | ICD-10-CM | POA: Diagnosis not present

## 2015-12-26 DIAGNOSIS — J449 Chronic obstructive pulmonary disease, unspecified: Secondary | ICD-10-CM | POA: Diagnosis present

## 2015-12-26 DIAGNOSIS — N170 Acute kidney failure with tubular necrosis: Secondary | ICD-10-CM | POA: Diagnosis not present

## 2015-12-26 DIAGNOSIS — L03116 Cellulitis of left lower limb: Secondary | ICD-10-CM | POA: Diagnosis present

## 2015-12-26 DIAGNOSIS — L97509 Non-pressure chronic ulcer of other part of unspecified foot with unspecified severity: Principal | ICD-10-CM

## 2015-12-26 DIAGNOSIS — Z89421 Acquired absence of other right toe(s): Secondary | ICD-10-CM

## 2015-12-26 DIAGNOSIS — E860 Dehydration: Secondary | ICD-10-CM

## 2015-12-26 DIAGNOSIS — Z794 Long term (current) use of insulin: Secondary | ICD-10-CM

## 2015-12-26 DIAGNOSIS — E162 Hypoglycemia, unspecified: Secondary | ICD-10-CM

## 2015-12-26 DIAGNOSIS — E1152 Type 2 diabetes mellitus with diabetic peripheral angiopathy with gangrene: Secondary | ICD-10-CM | POA: Diagnosis present

## 2015-12-26 DIAGNOSIS — I119 Hypertensive heart disease without heart failure: Secondary | ICD-10-CM | POA: Diagnosis present

## 2015-12-26 DIAGNOSIS — I480 Paroxysmal atrial fibrillation: Secondary | ICD-10-CM | POA: Diagnosis present

## 2015-12-26 DIAGNOSIS — Z7951 Long term (current) use of inhaled steroids: Secondary | ICD-10-CM | POA: Diagnosis not present

## 2015-12-26 DIAGNOSIS — E11621 Type 2 diabetes mellitus with foot ulcer: Secondary | ICD-10-CM

## 2015-12-26 DIAGNOSIS — I2510 Atherosclerotic heart disease of native coronary artery without angina pectoris: Secondary | ICD-10-CM | POA: Diagnosis present

## 2015-12-26 DIAGNOSIS — Z720 Tobacco use: Secondary | ICD-10-CM | POA: Diagnosis not present

## 2015-12-26 DIAGNOSIS — Z79899 Other long term (current) drug therapy: Secondary | ICD-10-CM | POA: Diagnosis not present

## 2015-12-26 DIAGNOSIS — Z8249 Family history of ischemic heart disease and other diseases of the circulatory system: Secondary | ICD-10-CM | POA: Diagnosis not present

## 2015-12-26 DIAGNOSIS — L8990 Pressure ulcer of unspecified site, unspecified stage: Secondary | ICD-10-CM | POA: Diagnosis not present

## 2015-12-26 DIAGNOSIS — N179 Acute kidney failure, unspecified: Secondary | ICD-10-CM | POA: Diagnosis not present

## 2015-12-26 DIAGNOSIS — I96 Gangrene, not elsewhere classified: Secondary | ICD-10-CM

## 2015-12-26 DIAGNOSIS — Z9889 Other specified postprocedural states: Secondary | ICD-10-CM | POA: Diagnosis not present

## 2015-12-26 DIAGNOSIS — L97512 Non-pressure chronic ulcer of other part of right foot with fat layer exposed: Secondary | ICD-10-CM

## 2015-12-26 DIAGNOSIS — Z8673 Personal history of transient ischemic attack (TIA), and cerebral infarction without residual deficits: Secondary | ICD-10-CM | POA: Diagnosis not present

## 2015-12-26 DIAGNOSIS — I252 Old myocardial infarction: Secondary | ICD-10-CM

## 2015-12-26 DIAGNOSIS — E11649 Type 2 diabetes mellitus with hypoglycemia without coma: Secondary | ICD-10-CM | POA: Diagnosis not present

## 2015-12-26 DIAGNOSIS — Z7982 Long term (current) use of aspirin: Secondary | ICD-10-CM | POA: Diagnosis not present

## 2015-12-26 DIAGNOSIS — Z951 Presence of aortocoronary bypass graft: Secondary | ICD-10-CM | POA: Diagnosis not present

## 2015-12-26 DIAGNOSIS — Z803 Family history of malignant neoplasm of breast: Secondary | ICD-10-CM

## 2015-12-26 DIAGNOSIS — Z87891 Personal history of nicotine dependence: Secondary | ICD-10-CM

## 2015-12-26 DIAGNOSIS — I959 Hypotension, unspecified: Secondary | ICD-10-CM | POA: Diagnosis not present

## 2015-12-26 HISTORY — DX: Cerebral infarction, unspecified: I63.9

## 2015-12-26 LAB — I-STAT VENOUS BLOOD GAS, ED
ACID-BASE EXCESS: 1 mmol/L (ref 0.0–2.0)
Acid-base deficit: 2 mmol/L (ref 0.0–2.0)
BICARBONATE: 28.2 meq/L — AB (ref 20.0–24.0)
Bicarbonate: 27.5 mEq/L — ABNORMAL HIGH (ref 20.0–24.0)
O2 Saturation: 42 %
O2 Saturation: 74 %
PCO2 VEN: 65.1 mmHg — AB (ref 45.0–50.0)
PH VEN: 7.234 — AB (ref 7.250–7.300)
PH VEN: 7.309 — AB (ref 7.250–7.300)
TCO2: 30 mmol/L (ref 0–100)
TCO2: 30 mmol/L (ref 0–100)
pCO2, Ven: 56.1 mmHg — ABNORMAL HIGH (ref 45.0–50.0)
pO2, Ven: 29 mmHg — CL (ref 30.0–45.0)
pO2, Ven: 44 mmHg (ref 30.0–45.0)

## 2015-12-26 LAB — URINE MICROSCOPIC-ADD ON: RBC / HPF: NONE SEEN RBC/hpf (ref 0–5)

## 2015-12-26 LAB — I-STAT CG4 LACTIC ACID, ED
LACTIC ACID, VENOUS: 1.47 mmol/L (ref 0.5–2.0)
LACTIC ACID, VENOUS: 2.15 mmol/L — AB (ref 0.5–2.0)
Lactic Acid, Venous: 0.98 mmol/L (ref 0.5–2.0)

## 2015-12-26 LAB — MRSA PCR SCREENING: MRSA by PCR: NEGATIVE

## 2015-12-26 LAB — URINALYSIS, ROUTINE W REFLEX MICROSCOPIC
Bilirubin Urine: NEGATIVE
Glucose, UA: NEGATIVE mg/dL
Hgb urine dipstick: NEGATIVE
Ketones, ur: NEGATIVE mg/dL
NITRITE: NEGATIVE
PROTEIN: NEGATIVE mg/dL
SPECIFIC GRAVITY, URINE: 1.014 (ref 1.005–1.030)
pH: 5 (ref 5.0–8.0)

## 2015-12-26 LAB — I-STAT CHEM 8, ED
BUN: 35 mg/dL — ABNORMAL HIGH (ref 6–20)
CHLORIDE: 97 mmol/L — AB (ref 101–111)
Calcium, Ion: 1.13 mmol/L (ref 1.13–1.30)
Creatinine, Ser: 1.8 mg/dL — ABNORMAL HIGH (ref 0.61–1.24)
GLUCOSE: 59 mg/dL — AB (ref 65–99)
HEMATOCRIT: 35 % — AB (ref 39.0–52.0)
HEMOGLOBIN: 11.9 g/dL — AB (ref 13.0–17.0)
POTASSIUM: 4.3 mmol/L (ref 3.5–5.1)
SODIUM: 135 mmol/L (ref 135–145)
TCO2: 27 mmol/L (ref 0–100)

## 2015-12-26 LAB — COMPREHENSIVE METABOLIC PANEL
ALT: 13 U/L — AB (ref 17–63)
AST: 23 U/L (ref 15–41)
Albumin: 3.3 g/dL — ABNORMAL LOW (ref 3.5–5.0)
Alkaline Phosphatase: 55 U/L (ref 38–126)
Anion gap: 11 (ref 5–15)
BILIRUBIN TOTAL: 0.5 mg/dL (ref 0.3–1.2)
BUN: 31 mg/dL — ABNORMAL HIGH (ref 6–20)
CHLORIDE: 99 mmol/L — AB (ref 101–111)
CO2: 26 mmol/L (ref 22–32)
CREATININE: 1.98 mg/dL — AB (ref 0.61–1.24)
Calcium: 8.9 mg/dL (ref 8.9–10.3)
GFR, EST AFRICAN AMERICAN: 37 mL/min — AB (ref >60)
GFR, EST NON AFRICAN AMERICAN: 32 mL/min — AB (ref >60)
Glucose, Bld: 56 mg/dL — ABNORMAL LOW (ref 65–99)
POTASSIUM: 5.1 mmol/L (ref 3.5–5.1)
Sodium: 136 mmol/L (ref 135–145)
TOTAL PROTEIN: 6.2 g/dL — AB (ref 6.5–8.1)

## 2015-12-26 LAB — CBC WITH DIFFERENTIAL/PLATELET
BASOS ABS: 0 10*3/uL (ref 0.0–0.1)
BASOS PCT: 0 %
EOS ABS: 0.1 10*3/uL (ref 0.0–0.7)
EOS PCT: 1 %
HCT: 36.6 % — ABNORMAL LOW (ref 39.0–52.0)
HEMOGLOBIN: 11.5 g/dL — AB (ref 13.0–17.0)
LYMPHS ABS: 0.5 10*3/uL — AB (ref 0.7–4.0)
Lymphocytes Relative: 5 %
MCH: 30.4 pg (ref 26.0–34.0)
MCHC: 31.4 g/dL (ref 30.0–36.0)
MCV: 96.8 fL (ref 78.0–100.0)
Monocytes Absolute: 0.8 10*3/uL (ref 0.1–1.0)
Monocytes Relative: 8 %
NEUTROS PCT: 86 %
Neutro Abs: 8.9 10*3/uL — ABNORMAL HIGH (ref 1.7–7.7)
PLATELETS: 259 10*3/uL (ref 150–400)
RBC: 3.78 MIL/uL — AB (ref 4.22–5.81)
RDW: 15.3 % (ref 11.5–15.5)
WBC: 10.4 10*3/uL (ref 4.0–10.5)

## 2015-12-26 LAB — GLUCOSE, CAPILLARY
GLUCOSE-CAPILLARY: 65 mg/dL (ref 65–99)
Glucose-Capillary: 81 mg/dL (ref 65–99)

## 2015-12-26 LAB — CBG MONITORING, ED
GLUCOSE-CAPILLARY: 92 mg/dL (ref 65–99)
GLUCOSE-CAPILLARY: 88 mg/dL (ref 65–99)
GLUCOSE-CAPILLARY: 85 mg/dL (ref 65–99)
GLUCOSE-CAPILLARY: 57 mg/dL — AB (ref 65–99)
Glucose-Capillary: 80 mg/dL (ref 65–99)

## 2015-12-26 LAB — I-STAT TROPONIN, ED: Troponin i, poc: 0.06 ng/mL (ref 0.00–0.08)

## 2015-12-26 MED ORDER — VANCOMYCIN HCL IN DEXTROSE 1-5 GM/200ML-% IV SOLN
1000.0000 mg | Freq: Once | INTRAVENOUS | Status: AC
  Administered 2015-12-26: 1000 mg via INTRAVENOUS
  Filled 2015-12-26: qty 200

## 2015-12-26 MED ORDER — DEXTROSE 50 % IV SOLN
25.0000 mL | Freq: Once | INTRAVENOUS | Status: AC
  Administered 2015-12-26: 25 mL via INTRAVENOUS
  Filled 2015-12-26: qty 50

## 2015-12-26 MED ORDER — CLOPIDOGREL BISULFATE 75 MG PO TABS
75.0000 mg | ORAL_TABLET | Freq: Every day | ORAL | Status: DC
  Administered 2015-12-27 – 2015-12-28 (×2): 75 mg via ORAL
  Filled 2015-12-26 (×2): qty 1

## 2015-12-26 MED ORDER — PIPERACILLIN-TAZOBACTAM 3.375 G IVPB
3.3750 g | Freq: Once | INTRAVENOUS | Status: AC
  Administered 2015-12-26: 3.375 g via INTRAVENOUS
  Filled 2015-12-26: qty 50

## 2015-12-26 MED ORDER — DOXYCYCLINE HYCLATE 100 MG PO TABS
100.0000 mg | ORAL_TABLET | Freq: Two times a day (BID) | ORAL | Status: DC
  Administered 2015-12-26: 100 mg via ORAL
  Filled 2015-12-26: qty 1

## 2015-12-26 MED ORDER — ACETAMINOPHEN 650 MG RE SUPP: 650.0000 mg | Freq: Four times a day (QID) | RECTAL | Status: DC | PRN

## 2015-12-26 MED ORDER — DEXTROSE-NACL 5-0.45 % IV SOLN
INTRAVENOUS | Status: DC
  Administered 2015-12-26 – 2015-12-27 (×2): via INTRAVENOUS

## 2015-12-26 MED ORDER — ACETAMINOPHEN 325 MG PO TABS
650.0000 mg | ORAL_TABLET | Freq: Four times a day (QID) | ORAL | Status: DC | PRN
  Administered 2015-12-27: 650 mg via ORAL
  Filled 2015-12-26: qty 2

## 2015-12-26 MED ORDER — SODIUM CHLORIDE 0.9 % IV BOLUS (SEPSIS)
2000.0000 mL | Freq: Once | INTRAVENOUS | Status: AC
  Administered 2015-12-26: 2000 mL via INTRAVENOUS

## 2015-12-26 MED ORDER — CIPROFLOXACIN HCL 500 MG PO TABS
500.0000 mg | ORAL_TABLET | Freq: Two times a day (BID) | ORAL | Status: DC
  Administered 2015-12-26: 500 mg via ORAL
  Filled 2015-12-26 (×3): qty 1

## 2015-12-26 MED ORDER — ALBUTEROL SULFATE (2.5 MG/3ML) 0.083% IN NEBU: 3.0000 mL | INHALATION_SOLUTION | Freq: Four times a day (QID) | RESPIRATORY_TRACT | Status: DC | PRN

## 2015-12-26 MED ORDER — NALOXONE HCL 2 MG/2ML IJ SOSY
1.0000 mg | PREFILLED_SYRINGE | Freq: Once | INTRAMUSCULAR | Status: AC
  Administered 2015-12-26: 1 mg via INTRAVENOUS
  Filled 2015-12-26: qty 2

## 2015-12-26 MED ORDER — NITROGLYCERIN 0.4 MG SL SUBL: 0.4000 mg | SUBLINGUAL_TABLET | SUBLINGUAL | Status: DC | PRN

## 2015-12-26 MED ORDER — POTASSIUM CHLORIDE CRYS ER 20 MEQ PO TBCR
40.0000 meq | EXTENDED_RELEASE_TABLET | Freq: Every day | ORAL | Status: DC
  Administered 2015-12-27 – 2015-12-28 (×2): 40 meq via ORAL
  Filled 2015-12-26 (×2): qty 2

## 2015-12-26 MED ORDER — DEXTROSE 5 % IV SOLN: Freq: Once | INTRAVENOUS | Status: DC

## 2015-12-26 MED ORDER — ENOXAPARIN SODIUM 80 MG/0.8ML ~~LOC~~ SOLN
0.5000 mg/kg | SUBCUTANEOUS | Status: DC
  Administered 2015-12-26 – 2015-12-28 (×3): 80 mg via SUBCUTANEOUS
  Filled 2015-12-26 (×4): qty 0.8

## 2015-12-26 MED ORDER — ASPIRIN EC 81 MG PO TBEC
81.0000 mg | DELAYED_RELEASE_TABLET | Freq: Every day | ORAL | Status: DC
  Administered 2015-12-27 – 2015-12-28 (×2): 81 mg via ORAL
  Filled 2015-12-26 (×2): qty 1

## 2015-12-26 NOTE — H&P (Signed)
[facility] History and Physical  Charles [name] [id number] DOB: [birth year] DOA: 12/26/2015  Referring physician: Verneda Skill PCP: [name], MD   Chief Complaint: weakness   HPI: Charles [name] is a 75 y.o. male with history of Diabetes Mellitus Type 2, Hypertension, CAD, Hypercholesteremia, neuropathy, Mrsa to right foot He presents  to the Emergency department today after being seen in Dr. Mellody Drown office for a recheck.  Pt reports he had surgery to remove 2nd and 3rd toes on 1/11.  Pt was found to be hypotensive in Dr. Mellody Drown office.  Pt was unsteady on his feet and weak  per his wife.  Pt required help walking.  Pt was sleepy and reported increased tiredness.  Pt has experienced similar feeling when his glucose is low. He reports he was well yesterday.  Pt took his insulin before bed last pm.  Pt is currently on antibiotics.  Pt denies fever, no chills.  Appetite has been good but he did not eat this am.     Review of Systems:  Constitutional:  No weight loss, night sweats, Fevers, chills, fatigue.  HEENT:  No headaches, Difficulty swallowing,Tooth/dental problems,Sore throat,  No sneezing, itching, ear ache, nasal congestion, post nasal drip,  Cardio-vascular:  No chest pain, Orthopnea, PND, swelling in lower extremities, anasarca, dizziness, palpitations  GI:  No heartburn, indigestion, abdominal pain, nausea, vomiting, diarrhea, change in bowel habits, loss of appetite  Resp:  No shortness of breath with exertion or at rest. No excess mucus, no productive cough, No non-productive cough, No coughing up of blood.No change in color of mucus.No wheezing.No chest wall deformity  Skin:  no rash or lesions.  GU:  no dysuria, change in color of urine, no urgency or frequency. No flank pain.  Musculoskeletal:  No joint pain or swelling. No decreased range of motion. No back pain.  Psych:  No change in mood or affect. No depression or anxiety. No memory loss.   Past  Medical History  Diagnosis Date  . CAD (coronary artery disease)     a. Approx. 2000 - MI. Cath showed single vessel disease, PTCA dLAD/medical management. ;  b. NSTEMI 11/13 => LHC: prox and mid LAD 30%, dLAD 60%, pCFX 30%, inf branch of OM 40%, mRCA 50-60%, EF 55-60%; IVUS attempted for RCA but not successful; anatomy felt stable from 2000 => med Rx.  . Diverticulitis   . Hypertension   . Morbid obesity ([place])   . CVA (cerebral infarction)     a. Approx. 2002  . Cholelithiasis   . Hypercholesterolemia   . ED (erectile dysfunction)   . OSA on CPAP   . Type II diabetes mellitus ([place])   . History of stomach ulcers   . History of MRSA infection     "little toe right foot" (10/26/2012)  . Tunnel vision     "both eyes since stroke"  . Claustrophobia   . B12 deficiency   . Diabetic neuropathy ([place])   . PAF (paroxysmal atrial fibrillation) ([name])     a. confirmed by event monitor. b. 02/2014 rash on Coumadin, patient decided to discontinue Xarelto due to possible rash, cost and lawyers ads on TV, agreed to take Plavix.  . Cellulitis 04/2015    rt lower extremity   Past Surgical History  Procedure Laterality Date  . Toe amputation  2006; 2009    "Dr. Blenda Mounts; big toe left foot; little toe on right foot" (10/26/2012)  . Cardiac catheterization  1990's  . Cerebral  angiogram  ~ 2000  . Left heart catheterization with coronary angiogram N/A 10/27/2012    Procedure: LEFT HEART CATHETERIZATION WITH CORONARY ANGIOGRAM;  Surgeon: Burnell Blanks, MD;  Location: [facility] CATH LAB;  Service: Cardiovascular;  Laterality: N/A;   Social History:  reports that he has quit smoking. His smoking use included Cigarettes and Cigars. He has a 30 pack-year smoking history. His smokeless tobacco use includes Snuff. He reports that he drinks alcohol. He reports that he does not use illicit drugs.  Allergies  Allergen Reactions  . Xarelto [Rivaroxaban]     rash    Family History  Problem Relation Age of  Onset  . Asthma Neg Hx   . Cancer Mother 23    breast  . Heart disease Father 53    heart     Prior to Admission medications   Medication Sig Start Date End Date Taking? Authorizing Provider  acetaminophen (TYLENOL) 325 MG tablet Take 2 tablets (650 mg total) by mouth every 6 (six) hours as needed for mild pain, fever or headache (or Fever >/= 101). 08/06/14   Modena Jansky, MD  ADVAIR DISKUS 250-50 MCG/DOSE AEPB Take 1 application by mouth daily. 02/12/15   Historical Provider, MD  albuterol (PROVENTIL HFA;VENTOLIN HFA) 108 (90 BASE) MCG/ACT inhaler Inhale 2 puffs into the lungs every 6 (six) hours as needed for wheezing or shortness of breath.    Historical Provider, MD  aspirin 81 MG tablet Take 81 mg by mouth daily.    Historical Provider, MD  Cholecalciferol (EQL VITAMIN D3) 1000 UNITS tablet Take 1,000 Units by mouth daily.      Historical Provider, MD  ciprofloxacin (CIPRO) 500 MG tablet Take 1 tablet (500 mg total) by mouth 2 (two) times daily. 12/02/15   Trula Slade, DPM  ciprofloxacin (CIPRO) 500 MG tablet Take 1 tablet (500 mg total) by mouth 2 (two) times daily. 12/08/15   Wallene Huh, DPM  ciprofloxacin (CIPRO) 500 MG tablet Take 1 tablet (500 mg total) by mouth 2 (two) times daily. 12/17/15   Wallene Huh, DPM  clindamycin (CLEOCIN) 300 MG capsule Take 1 capsule (300 mg total) by mouth 3 (three) times daily. 12/02/15   Trula Slade, DPM  clopidogrel (PLAVIX) 75 MG tablet Take 1 tablet (75 mg total) by mouth daily. 08/27/14   Aleksei Plotnikov V, MD  doxycycline (VIBRA-TABS) 100 MG tablet Take 1 tablet (100 mg total) by mouth 2 (two) times daily. 11/24/15   Wallene Huh, DPM  furosemide (LASIX) 80 MG tablet Take 1 tablet (80 mg total) by mouth daily. 07/22/15   Aleksei Plotnikov V, MD  gabapentin (NEURONTIN) 300 MG capsule TAKE 1 CAPSULE THREE TIMES DAILY AS NEEDED FOR NERVE PAIN 06/20/15   Aleksei Plotnikov V, MD  glimepiride (AMARYL) 4 MG tablet TAKE 1 TABLET TWICE  DAILY 06/20/15   Aleksei Plotnikov V, MD  insulin lispro (HUMALOG KWIKPEN) 100 UNIT/ML KiwkPen Inject 5-20 Units into the skin 4 (four) times daily - after meals and at bedtime. Per sliding scale 100-120 per cbg    Historical Provider, MD  losartan (COZAAR) 100 MG tablet TAKE 1 TABLET EVERY DAY 06/20/15   Aleksei Plotnikov V, MD  metoprolol (LOPRESSOR) 50 MG tablet TAKE 1 TABLET TWICE DAILY 06/20/15   Aleksei Plotnikov V, MD  nitroGLYCERIN (NITROSTAT) 0.4 MG SL tablet Place 0.4 mg under the tongue every 5 (five) minutes as needed. For chest pain 06/07/11   Cassandria Anger, MD  pioglitazone (  ACTOS) 45 MG tablet TAKE 1 TABLET EVERY DAY 06/20/15   Aleksei Plotnikov V, MD  potassium chloride SA (K-DUR,KLOR-CON) 20 MEQ tablet Take 2 tablets (40 mEq total) by mouth daily. 09/16/14   Aleksei Plotnikov V, MD  pravastatin (PRAVACHOL) 40 MG tablet TAKE 1 TABLET DAILY. 06/20/15   Aleksei Plotnikov V, MD  silver sulfADIAZINE (SILVADENE) 1 % cream Apply 1 application topically daily. 08/27/14   Harriet Masson, DPM  Spacer/Aero-Holding Chambers (BREATHERITE COLL SPACER ADULT) MISC Use w/proventil MDI 12/17/14   Lew Dawes V, MD  triamcinolone cream (KENALOG) 0.5 % Apply 1 application topically 2 (two) times daily as needed (rash). 04/18/15   Aleksei Plotnikov V, MD  vitamin B-12 (CYANOCOBALAMIN) 1000 MCG tablet Take 1,000 mcg by mouth daily.      Historical Provider, MD   Physical Exam: Filed Vitals:   12/26/15 1130 12/26/15 1230 12/26/15 1330 12/26/15 1415  BP: 75/53 96/46 102/50 104/85  Pulse: 68 62 67 70  Temp: 97.9 F (36.6 C)     TempSrc: Oral     Resp: 18 14 11 15   Height: 6\' 4"  (1.93 m)     Weight: 350 lb (158.759 kg)     SpO2: 100% 91% 95% 100%    Wt Readings from Last 3 Encounters:  12/26/15 350 lb (158.759 kg)  10/21/15 351 lb (159.213 kg)  07/22/15 351 lb (159.213 kg)    General:  Appears calm and comfortable Eyes: PERRL, normal lids, irises & conjunctiva ENT: grossly normal hearing, lips &  tongue Neck: no LAD, masses or thyromegaly Cardiovascular: RRR, no m/r/g. No LE edema. Telemetry: SR, no arrhythmias  Respiratory: CTA bilaterally, no w/r/r. Normal respiratory effort. Abdomen: soft, ntnd Skin: no rash or induration seen on limited exam Musculoskeletal: splint right foot,  Psychiatric: grossly normal mood and affect, speech fluent and appropriate Neurologic: grossly non-focal.          Labs on Admission:  Basic Metabolic Panel:  Recent Labs Lab 12/26/15 1153 12/26/15 1242  NA 136 135  K 5.1 4.3  CL 99* 97*  CO2 26  --   GLUCOSE 56* 59*  BUN 31* 35*  CREATININE 1.98* 1.80*  CALCIUM 8.9  --    Liver Function Tests:  Recent Labs Lab 12/26/15 1153  AST 23  ALT 13*  ALKPHOS 55  BILITOT 0.5  PROT 6.2*  ALBUMIN 3.3*   No results for input(s): LIPASE, AMYLASE in the last 168 hours. No results for input(s): AMMONIA in the last 168 hours. CBC:  Recent Labs Lab 12/26/15 1153 12/26/15 1242  WBC 10.4  --   NEUTROABS 8.9*  --   HGB 11.5* 11.9*  HCT 36.6* 35.0*  MCV 96.8  --   PLT 259  --    Cardiac Enzymes: No results for input(s): CKTOTAL, CKMB, CKMBINDEX, TROPONINI in the last 168 hours.  BNP (last 3 results) No results for input(s): BNP in the last 8760 hours.  ProBNP (last 3 results)  Recent Labs  05/19/15 1542  PROBNP 235.0*    CBG:  Recent Labs Lab 12/26/15 1128 12/26/15 1252 12/26/15 1434  GLUCAP 92 80 88    Radiological Exams on Admission: Dg Chest Port 1 View  12/26/2015  CLINICAL DATA:  Hypertension and hypoglycemia 1 day. Foot surgery 1 week ago. EXAM: PORTABLE CHEST 1 VIEW COMPARISON:  07/25/2014 FINDINGS: Lungs are adequately inflated without consolidation or effusion. Mild stable cardiomegaly. Calcified plaque over the aortic arch. Prominent overlying soft tissues. Degenerative change of the spine. IMPRESSION:  Hypoinflation without acute cardiopulmonary disease. Mild stable cardiomegaly. Electronically Signed   By:  Marin Olp M.D.   On: 12/26/2015 14:16    EKG: Independently reviewed.   Assessment/Plan Active Problems:   Hypotension   Hypoglycemia   Gangrene of toe ([name])   Dehydration   Acute renal failure ([place])   1. Hypotension Hold blood pressure medications  2 Hypoglycemia Hold amaryl and humalog D5 1/2 normal saline 50 cc/hr  3. Gangrene of toes Continue cipro and doxycycline Recheck cbc in am  4. Dehydration  Pt receiving Iv fluids Pt tolerating po fluids  5. Acute renal failure secondary to dehydration Pt receiving oral and Iv hydration    Code Status: full DVT Prophylaxis: Family Communication:  Wife at bedside.  Disposition Plan:   Time spent: [facility] Pager [phone]

## 2015-12-26 NOTE — ED Notes (Signed)
CBG: 80 

## 2015-12-26 NOTE — Progress Notes (Signed)
12/26/2015 Patient transfer from the emergency room to 2 central via stretcher. He is alert, oriented stated he normally walk. Patient have surgical wound on right foot, stated surgery was done on Wednesday. Patient big toe was pink and perfusing well. Unable to assess the entire foot, because it was bandage up with kerlex and ace wrap. Patient had a Darco boot on right foot. 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 Clean gauze apply and wrap with kerlex. skintear on left elbow foam dressing apply. Mole noted on left abdomen [facility] RN.

## 2015-12-26 NOTE — ED Notes (Signed)
Pt's CBG result was 88. Informed Junie Panning - RN

## 2015-12-26 NOTE — ED Notes (Signed)
Pt's CBG result was 85. Informed Erin - RN.

## 2015-12-26 NOTE — ED Notes (Signed)
Patient here with complaint of BP running low and blood sugar running low for the past day. Had surgery on foot 1 week ago. Patient here complaining of weakness

## 2015-12-26 NOTE — ED Notes (Signed)
Family reports that pt is not to receive a catheter per Urology.  MD made aware.

## 2015-12-26 NOTE — Progress Notes (Signed)
Pt presented to office with symptoms of dizziness, slight lethargy, he remained alert and oriented but very unsteady on his feet. Glucose 89 (per pt wife) BP 98/42 P 75 R 16 Temp 96.9.  Pt wife stated she held all oral diabetic medications , pt did take metoprolol 50mg  this morning. He had surgery on 12/23/14,AMPUTATION TOE MPJ JOINT 3RD RT AND AMPUTATION TOE INTERPHALANGEAL 2ND RT  he was not showing any s/s of infection at this time, changed ace wrap, surgi cal drains and gauze dressings remained intact. Due to instability of blood glucose and hypotension advised pt to go to ED for further evaluation. Pt is to keep f/u appt on Monday and remain on oral antibiotics

## 2015-12-26 NOTE — Progress Notes (Signed)
Pharmacy Code Sepsis Protocol  Time of code sepsis page: 1216 Time of antibiotic delivery: 1233  Were antibiotics ordered at the time of the code sepsis page? Yes  One time doses ordered, no pharmacy consults yet  Was it required to contact the physician? [x]  Physician not contacted []  Physician contacted to order antibiotics for code sepsis []  Physician contacted to recommend changing antibiotics  Pharmacy not consulted yet  Anti-infectives    Start     Dose/Rate Route Frequency Ordered Stop   12/26/15 1215  vancomycin (VANCOCIN) IVPB 1000 mg/200 mL premix     1,000 mg 200 mL/hr over 60 Minutes Intravenous  Once 12/26/15 1212     12/26/15 1215  piperacillin-tazobactam (ZOSYN) IVPB 3.375 g     3.375 g 12.5 mL/hr over 240 Minutes Intravenous  Once 12/26/15 1212        @infusions @   Nurse education provided: [x]  Minutes left to administer antibiotics to achieve 1 hour goal [x]  Correct order of antibiotic administration [x]  Antibiotic Y-site compatibilities     Reginia Naas, PharmD 12/26/2015, 12:25 PM

## 2015-12-26 NOTE — ED Provider Notes (Signed)
CSN: [id number]     Arrival date & time 12/26/15  1121 History   First MD Initiated Contact with Patient 12/26/15 1159     Chief Complaint  Patient presents with  . Hypoglycemia  . Hypotension     (Consider location/radiation/quality/duration/timing/severity/associated sxs/prior Treatment) Patient is a 75 y.o. male presenting with weakness. The history is provided by the patient and the spouse (Patient has been weak and lethargic since last night. His wife states that his blood sugar has been running lower than normal for him last night 2 days ago he had his toes removed for infection).  Weakness This is a new problem. The current episode started 12 to 24 hours ago. The problem occurs constantly. The problem has not changed since onset.Pertinent negatives include no chest pain, no abdominal pain and no headaches. Nothing aggravates the symptoms. Nothing relieves the symptoms.    Past Medical History  Diagnosis Date  . CAD (coronary artery disease)     a. Approx. 2000 - MI. Cath showed single vessel disease, PTCA dLAD/medical management. ;  b. NSTEMI 11/13 => LHC: prox and mid LAD 30%, dLAD 60%, pCFX 30%, inf branch of OM 40%, mRCA 50-60%, EF 55-60%; IVUS attempted for RCA but not successful; anatomy felt stable from 2000 => med Rx.  . Diverticulitis   . Hypertension   . Morbid obesity ([place])   . CVA (cerebral infarction)     a. Approx. 2002  . Cholelithiasis   . Hypercholesterolemia   . ED (erectile dysfunction)   . OSA on CPAP   . Type II diabetes mellitus ([place])   . History of stomach ulcers   . History of MRSA infection     "little toe right foot" (10/26/2012)  . Tunnel vision     "both eyes since stroke"  . Claustrophobia   . B12 deficiency   . Diabetic neuropathy ([place])   . PAF (paroxysmal atrial fibrillation) ([name])     a. confirmed by event monitor. b. 02/2014 rash on Coumadin, patient decided to discontinue Xarelto due to possible rash, cost and lawyers ads on TV, agreed to  take Plavix.  . Cellulitis 04/2015    rt lower extremity   Past Surgical History  Procedure Laterality Date  . Toe amputation  2006; 2009    "Dr. Blenda Mounts; big toe left foot; little toe on right foot" (10/26/2012)  . Cardiac catheterization  1990's  . Cerebral angiogram  ~ 2000  . Left heart catheterization with coronary angiogram N/A 10/27/2012    Procedure: LEFT HEART CATHETERIZATION WITH CORONARY ANGIOGRAM;  Surgeon: Burnell Blanks, MD;  Location: [facility] CATH LAB;  Service: Cardiovascular;  Laterality: N/A;   Family History  Problem Relation Age of Onset  . Asthma Neg Hx   . Cancer Mother 60    breast  . Heart disease Father 61    heart   Social History  Substance Use Topics  . Smoking status: Former Smoker -- 1.00 packs/day for 30 years    Types: Cigarettes, Cigars  . Smokeless tobacco: Current User    Types: Snuff     Comment: 10/26/2012 "quit smoking cigarettes  in ~ 1996; wife smokes in the house"  . Alcohol Use: Yes     Comment: 10/26/2012 "have a drink maybe twice/yr"    Review of Systems  Constitutional: Negative for appetite change and fatigue.  HENT: Negative for congestion, ear discharge and sinus pressure.   Eyes: Negative for discharge.  Respiratory: Negative for cough.   Cardiovascular:  Negative for chest pain.  Gastrointestinal: Negative for abdominal pain and diarrhea.  Genitourinary: Negative for frequency and hematuria.  Musculoskeletal: Negative for back pain.  Skin: Negative for rash.  Neurological: Positive for weakness. Negative for seizures and headaches.  Psychiatric/Behavioral: Negative for hallucinations.      Allergies  Xarelto  Home Medications   Prior to Admission medications   Medication Sig Start Date End Date Taking? Authorizing Provider  acetaminophen (TYLENOL) 325 MG tablet Take 2 tablets (650 mg total) by mouth every 6 (six) hours as needed for mild pain, fever or headache (or Fever >/= 101). 08/06/14   Modena Jansky, MD   ADVAIR DISKUS 250-50 MCG/DOSE AEPB Take 1 application by mouth daily. 02/12/15   Historical Provider, MD  albuterol (PROVENTIL HFA;VENTOLIN HFA) 108 (90 BASE) MCG/ACT inhaler Inhale 2 puffs into the lungs every 6 (six) hours as needed for wheezing or shortness of breath.    Historical Provider, MD  aspirin 81 MG tablet Take 81 mg by mouth daily.    Historical Provider, MD  Cholecalciferol (EQL VITAMIN D3) 1000 UNITS tablet Take 1,000 Units by mouth daily.      Historical Provider, MD  ciprofloxacin (CIPRO) 500 MG tablet Take 1 tablet (500 mg total) by mouth 2 (two) times daily. 12/02/15   Trula Slade, DPM  ciprofloxacin (CIPRO) 500 MG tablet Take 1 tablet (500 mg total) by mouth 2 (two) times daily. 12/08/15   Wallene Huh, DPM  ciprofloxacin (CIPRO) 500 MG tablet Take 1 tablet (500 mg total) by mouth 2 (two) times daily. 12/17/15   Wallene Huh, DPM  clindamycin (CLEOCIN) 300 MG capsule Take 1 capsule (300 mg total) by mouth 3 (three) times daily. 12/02/15   Trula Slade, DPM  clopidogrel (PLAVIX) 75 MG tablet Take 1 tablet (75 mg total) by mouth daily. 08/27/14   Aleksei Plotnikov V, MD  doxycycline (VIBRA-TABS) 100 MG tablet Take 1 tablet (100 mg total) by mouth 2 (two) times daily. 11/24/15   Wallene Huh, DPM  furosemide (LASIX) 80 MG tablet Take 1 tablet (80 mg total) by mouth daily. 07/22/15   Aleksei Plotnikov V, MD  gabapentin (NEURONTIN) 300 MG capsule TAKE 1 CAPSULE THREE TIMES DAILY AS NEEDED FOR NERVE PAIN 06/20/15   Aleksei Plotnikov V, MD  glimepiride (AMARYL) 4 MG tablet TAKE 1 TABLET TWICE DAILY 06/20/15   Aleksei Plotnikov V, MD  insulin lispro (HUMALOG KWIKPEN) 100 UNIT/ML KiwkPen Inject 5-20 Units into the skin 4 (four) times daily - after meals and at bedtime. Per sliding scale 100-120 per cbg    Historical Provider, MD  losartan (COZAAR) 100 MG tablet TAKE 1 TABLET EVERY DAY 06/20/15   Aleksei Plotnikov V, MD  metoprolol (LOPRESSOR) 50 MG tablet TAKE 1 TABLET TWICE DAILY  06/20/15   Aleksei Plotnikov V, MD  nitroGLYCERIN (NITROSTAT) 0.4 MG SL tablet Place 0.4 mg under the tongue every 5 (five) minutes as needed. For chest pain 06/07/11   Aleksei Plotnikov V, MD  pioglitazone (ACTOS) 45 MG tablet TAKE 1 TABLET EVERY DAY 06/20/15   Aleksei Plotnikov V, MD  potassium chloride SA (K-DUR,KLOR-CON) 20 MEQ tablet Take 2 tablets (40 mEq total) by mouth daily. 09/16/14   Aleksei Plotnikov V, MD  pravastatin (PRAVACHOL) 40 MG tablet TAKE 1 TABLET DAILY. 06/20/15   Aleksei Plotnikov V, MD  silver sulfADIAZINE (SILVADENE) 1 % cream Apply 1 application topically daily. 08/27/14   Harriet Masson, DPM  Spacer/Aero-Holding Chambers (BREATHERITE COLL SPACER ADULT) MISC  Use w/proventil MDI 12/17/14   Lew Dawes V, MD  triamcinolone cream (KENALOG) 0.5 % Apply 1 application topically 2 (two) times daily as needed (rash). 04/18/15   Aleksei Plotnikov V, MD  vitamin B-12 (CYANOCOBALAMIN) 1000 MCG tablet Take 1,000 mcg by mouth daily.      Historical Provider, MD   BP 115/62 mmHg  Pulse 80  Temp(Src) 97.9 F (36.6 C) (Oral)  Resp 11  Ht 6\' 4"  (1.93 m)  Wt 350 lb (158.759 kg)  BMI 42.62 kg/m2  SpO2 94% Physical Exam  Constitutional: He is oriented to person, place, and time. He appears well-developed.  HENT:  Head: Normocephalic.  Eyes: Conjunctivae and EOM are normal. No scleral icterus.  Neck: Neck supple. No thyromegaly present.  Cardiovascular: Normal rate and regular rhythm.  Exam reveals no gallop and no friction rub.   No murmur heard. Pulmonary/Chest: No stridor. He has no wheezes. He has no rales. He exhibits no tenderness.  Abdominal: He exhibits no distension. There is no tenderness. There is no rebound.  Musculoskeletal: Normal range of motion. He exhibits no edema.  Patient's right foot has had some recent amputation of the toes. This looks like it's healing appropriately  Lymphadenopathy:    He has no cervical adenopathy.  Neurological: He is oriented to person,  place, and time. He exhibits normal muscle tone. Coordination normal.  Patient mildly lethargic  Skin: No rash noted. No erythema.  Psychiatric: He has a normal mood and affect. His behavior is normal.    ED Course  Procedures (including critical care time) Labs Review Labs Reviewed  COMPREHENSIVE METABOLIC PANEL - Abnormal; Notable for the following:    Chloride 99 (*)    Glucose, Bld 56 (*)    BUN 31 (*)    Creatinine, Ser 1.98 (*)    Total Protein 6.2 (*)    Albumin 3.3 (*)    ALT 13 (*)    GFR calc non Af Amer 32 (*)    GFR calc Af Amer 37 (*)    All other components within normal limits  CBC WITH DIFFERENTIAL/PLATELET - Abnormal; Notable for the following:    RBC 3.78 (*)    Hemoglobin 11.5 (*)    HCT 36.6 (*)    Neutro Abs 8.9 (*)    Lymphs Abs 0.5 (*)    All other components within normal limits  URINALYSIS, ROUTINE W REFLEX MICROSCOPIC (NOT AT [facility]) - Abnormal; Notable for the following:    Color, Urine AMBER (*)    Leukocytes, UA TRACE (*)    All other components within normal limits  URINE MICROSCOPIC-ADD ON - Abnormal; Notable for the following:    Squamous Epithelial / LPF 0-5 (*)    Bacteria, UA RARE (*)    Casts HYALINE CASTS (*)    All other components within normal limits  I-STAT CHEM 8, ED - Abnormal; Notable for the following:    Chloride 97 (*)    BUN 35 (*)    Creatinine, Ser 1.80 (*)    Glucose, Bld 59 (*)    Hemoglobin 11.9 (*)    HCT 35.0 (*)    All other components within normal limits  I-STAT CG4 LACTIC ACID, ED - Abnormal; Notable for the following:    Lactic Acid, Venous 2.15 (*)    All other components within normal limits  I-STAT VENOUS BLOOD GAS, ED - Abnormal; Notable for the following:    pH, Ven 7.309 (*)    pCO2, Ven 56.1 (*)  Bicarbonate 28.2 (*)    All other components within normal limits  I-STAT VENOUS BLOOD GAS, ED - Abnormal; Notable for the following:    pH, Ven 7.234 (*)    pCO2, Ven 65.1 (*)    pO2, Ven 29.0 (*)     Bicarbonate 27.5 (*)    All other components within normal limits  CULTURE, BLOOD (ROUTINE X 2)  CULTURE, BLOOD (ROUTINE X 2)  URINE CULTURE  CBG MONITORING, ED  I-STAT CG4 LACTIC ACID, ED  I-STAT TROPOININ, ED  CBG MONITORING, ED  I-STAT CG4 LACTIC ACID, ED  CBG MONITORING, ED  I-STAT CG4 LACTIC ACID, ED    Imaging Review Dg Chest Port 1 View  12/26/2015  CLINICAL DATA:  Hypertension and hypoglycemia 1 day. Foot surgery 1 week ago. EXAM: PORTABLE CHEST 1 VIEW COMPARISON:  07/25/2014 FINDINGS: Lungs are adequately inflated without consolidation or effusion. Mild stable cardiomegaly. Calcified plaque over the aortic arch. Prominent overlying soft tissues. Degenerative change of the spine. IMPRESSION: Hypoinflation without acute cardiopulmonary disease. Mild stable cardiomegaly. Electronically Signed   By: Marin Olp M.D.   On: 12/26/2015 14:16   I have personally reviewed and evaluated these images and lab results as part of my medical decision-making.   EKG Interpretation None     CRITICAL CARE Performed by: Abdifatah Colquhoun L Total critical care time: 45 minutes Critical care time was exclusive of separately billable procedures and treating other patients. Critical care was necessary to treat or prevent imminent or life-threatening deterioration. Critical care was time spent personally by me on the following activities: development of treatment plan with patient and/or surrogate as well as nursing, discussions with consultants, evaluation of patient's response to treatment, examination of patient, obtaining history from patient or surrogate, ordering and performing treatments and interventions, ordering and review of laboratory studies, ordering and review of radiographic studies, pulse oximetry and re-evaluation of patient's condition.  MDM   Final diagnoses:  Hypotension, unspecified    Patient will be admitted for hypotension hypoglycemia. Patient also having some CO2  retention possibly secondary to taking pain medicines and his COPD    Milton Ferguson, MD 12/26/15 1539

## 2015-12-26 NOTE — ED Notes (Signed)
Sofia PA paged and made aware of CBG.  Further orders received.

## 2015-12-27 DIAGNOSIS — L8990 Pressure ulcer of unspecified site, unspecified stage: Secondary | ICD-10-CM | POA: Insufficient documentation

## 2015-12-27 LAB — CBC
HEMATOCRIT: 33 % — AB (ref 39.0–52.0)
HEMOGLOBIN: 10.6 g/dL — AB (ref 13.0–17.0)
MCH: 31.5 pg (ref 26.0–34.0)
MCHC: 32.1 g/dL (ref 30.0–36.0)
MCV: 98.2 fL (ref 78.0–100.0)
Platelets: 229 10*3/uL (ref 150–400)
RBC: 3.36 MIL/uL — ABNORMAL LOW (ref 4.22–5.81)
RDW: 15.4 % (ref 11.5–15.5)
WBC: 6.6 10*3/uL (ref 4.0–10.5)

## 2015-12-27 LAB — BASIC METABOLIC PANEL
Anion gap: 7 (ref 5–15)
BUN: 25 mg/dL — AB (ref 6–20)
CHLORIDE: 102 mmol/L (ref 101–111)
CO2: 27 mmol/L (ref 22–32)
Calcium: 8.1 mg/dL — ABNORMAL LOW (ref 8.9–10.3)
Creatinine, Ser: 1.38 mg/dL — ABNORMAL HIGH (ref 0.61–1.24)
GFR, EST AFRICAN AMERICAN: 57 mL/min — AB (ref >60)
GFR, EST NON AFRICAN AMERICAN: 49 mL/min — AB (ref >60)
GLUCOSE: 107 mg/dL — AB (ref 65–99)
Potassium: 4.3 mmol/L (ref 3.5–5.1)
SODIUM: 136 mmol/L (ref 135–145)

## 2015-12-27 LAB — GLUCOSE, CAPILLARY
GLUCOSE-CAPILLARY: 104 mg/dL — AB (ref 65–99)
GLUCOSE-CAPILLARY: 79 mg/dL (ref 65–99)
GLUCOSE-CAPILLARY: 89 mg/dL (ref 65–99)
GLUCOSE-CAPILLARY: 156 mg/dL — AB (ref 65–99)
GLUCOSE-CAPILLARY: 133 mg/dL — AB (ref 65–99)
GLUCOSE-CAPILLARY: 135 mg/dL — AB (ref 65–99)
GLUCOSE-CAPILLARY: 176 mg/dL — AB (ref 65–99)
Glucose-Capillary: 105 mg/dL — ABNORMAL HIGH (ref 65–99)
Glucose-Capillary: 106 mg/dL — ABNORMAL HIGH (ref 65–99)
Glucose-Capillary: 86 mg/dL (ref 65–99)
Glucose-Capillary: 95 mg/dL (ref 65–99)

## 2015-12-27 MED ORDER — VANCOMYCIN HCL 10 G IV SOLR
1750.0000 mg | INTRAVENOUS | Status: DC
  Administered 2015-12-28: 1750 mg via INTRAVENOUS
  Filled 2015-12-27 (×2): qty 1750

## 2015-12-27 MED ORDER — PIPERACILLIN-TAZOBACTAM 3.375 G IVPB
3.3750 g | Freq: Three times a day (TID) | INTRAVENOUS | Status: DC
  Administered 2015-12-27 – 2015-12-29 (×6): 3.375 g via INTRAVENOUS
  Filled 2015-12-27 (×7): qty 50

## 2015-12-27 MED ORDER — VANCOMYCIN HCL 10 G IV SOLR
2500.0000 mg | Freq: Once | INTRAVENOUS | Status: AC
  Administered 2015-12-27: 2500 mg via INTRAVENOUS
  Filled 2015-12-27: qty 2500

## 2015-12-27 MED ORDER — PIPERACILLIN-TAZOBACTAM 3.375 G IVPB 30 MIN
3.3750 g | Freq: Once | INTRAVENOUS | Status: AC
  Administered 2015-12-27: 3.375 g via INTRAVENOUS
  Filled 2015-12-27: qty 50

## 2015-12-27 NOTE — Progress Notes (Signed)
Patient refuses CPAP at this time.  

## 2015-12-27 NOTE — Progress Notes (Addendum)
ANTIBIOTIC CONSULT NOTE - INITIAL  Pharmacy Consult for vancomycin and zosyn Indication: cellulitis  Allergies  Allergen Reactions  . Xarelto [Rivaroxaban]     rash    Patient Measurements: Height: 6\' 4"  (193 cm) Weight: (!) 359 lb 12.7 oz (163.2 kg) IBW/kg (Calculated) : 86.8   Vital Signs: Temp: 99.5 F (37.5 C) (01/14 0850) Temp Source: Oral (01/14 0850) BP: 124/64 mmHg (01/14 0900) Pulse Rate: 89 (01/14 0900) Intake/Output from previous day: 01/13 0701 - 01/14 0700 In: 1476.7 [I.V.:1476.7] Out: 1550 [Urine:1550] Intake/Output from this shift: Total I/O In: 200 [I.V.:200] Out: 500 [Urine:500]  Labs:  Recent Labs  12/26/15 1153 12/26/15 1242 12/27/15 0302  WBC 10.4  --  6.6  HGB 11.5* 11.9* 10.6*  PLT 259  --  229  CREATININE 1.98* 1.80* 1.38*   Estimated Creatinine Clearance: 78 mL/min (by C-G formula based on Cr of 1.38). No results for input(s): VANCOTROUGH, VANCOPEAK, VANCORANDOM, GENTTROUGH, GENTPEAK, GENTRANDOM, TOBRATROUGH, TOBRAPEAK, TOBRARND, AMIKACINPEAK, AMIKACINTROU, AMIKACIN in the last 72 hours.   Microbiology: Recent Results (from the past 720 hour(s))  MRSA PCR Screening     Status: None   Collection Time: 12/26/15  6:27 PM  Result Value Ref Range Status   MRSA by PCR NEGATIVE NEGATIVE Final    Comment:        The GeneXpert MRSA Assay (FDA approved for NASAL specimens only), is one component of a comprehensive MRSA colonization surveillance program. It is not intended to diagnose MRSA infection nor to guide or monitor treatment for MRSA infections.     Medical History: Past Medical History  Diagnosis Date  . CAD (coronary artery disease)     a. Approx. 2000 - MI. Cath showed single vessel disease, PTCA dLAD/medical management. ;  b. NSTEMI 11/13 => LHC: prox and mid LAD 30%, dLAD 60%, pCFX 30%, inf branch of OM 40%, mRCA 50-60%, EF 55-60%; IVUS attempted for RCA but not successful; anatomy felt stable from 2000 => med Rx.  .  Diverticulitis   . Hypertension   . Morbid obesity ([place])   . Cholelithiasis   . Hypercholesterolemia   . ED (erectile dysfunction)   . OSA on CPAP   . Type II diabetes mellitus ([place])   . History of stomach ulcers   . History of MRSA infection     "little toe right foot" (10/26/2012)  . Tunnel vision     "both eyes since stroke"  . Claustrophobia   . B12 deficiency   . Diabetic neuropathy ([place])   . PAF (paroxysmal atrial fibrillation) ([name])     a. confirmed by event monitor. b. 02/2014 rash on Coumadin, patient decided to discontinue Xarelto due to possible rash, cost and lawyers ads on TV, agreed to take Plavix.  . Cellulitis 04/2015    rt lower extremity  . CVA (cerebral vascular accident) ([place]) ~ 2002    "tunnel vision; no peripheral vision; lost sense of direction; stability problems since" (12/26/2015)    Assessment: 75 yo morbidly obese M to start vanc/zosyn per pharmacy protocol for cellulitis. 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 Medication hx not yet completed but appears pt recently on PO abxs (doxy, clinda, cipro).  He got one time doses of vanc/zosyn in ED 12/26/15.  WBC 10.4>6.6, creat 1.8>>1.38. Normalized  creat cl ~ 48 ml/min. AF. 1/13 CXR: neg  vanc 1/13 1 gm in ED 1315   1/14>> Zosyn 1/13 3375 in ED 1230 1/14>>  1/13 MRSA  PCR neg 1/13 Ucx>> 1/13 BCx2>>   Goal of Therapy:  Vancomycin trough level 10-15 mcg/ml  Plan:  - zosyn 3.375 gm IV x 1 dose over 30 min then zosyn 3.375 gm IV q8h infuse each dose over 4 hours - vancomycin 2.5 gm loading dose, then vanc 1750 mg IV q24 - f/u renal fxn, wbc, temp,  - vanc levels as needed  Eudelia Bunch, Pharm.D. [id number] 12/27/2015 10:14 AM

## 2015-12-27 NOTE — Progress Notes (Signed)
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 Earleen Newport on 12/24/2015,He presents to the Emergency department today after being seen in Dr. Mellody Drown office for a recheck. sent from podiatry office for lethargy,Pt was unsteady on his feet and weak per his wife. Pt required help walking. Pt was sleepy and reported increased tiredness, was noticed to have hypoglycemia, hypotension, CABG 56 initially, blood pressure 75 /53, a febrile NAD, no leukocytosis, does not appear toxic, hospitalist requested to admit for hypoglycemia and hypotension,   Assessment and plan - Hypoglycemia :will keep on D5 half-normal saline, hold oral hypoglycemic medications,   - Hypotension : Hold oral hypotensive agents, continue with gentle hydration  - Acute renal failure : Baseline 0.8, BP 1.98, > 1.38, improving, Secondary to dehydration and volume depletion, continue with IV fluids, recheck BMP in a.m.   - Second and third toe amputation secondary to gangrene with cellulitis of left LE  : This is followed by podiatry as an outpatient, patient does not appear to be septic, dc doxycycline and Ciprofloxacin (will hold clindamycin ). Follow blood culture results.started vanc and zosyn for cellulitis pending cx results, discussed with wife and if pt wants to leave he will sign out AMA  - Positive urinalysis: No symptoms or signs of UTI, follow on urine culture, will hold on antibiotics. - History of  coronary artery disease; he denies any chest pain or shortness of breath, continue with aspirin and Plavix  - History of paroxysmal atrial fibrillation: By reviewing previous records in the past, patient is not anticoagulated due to a personal choice at as per his PCP documentation and previous admissions.    DVT prophylaxsis  Lovenox  Code Status:      Code Status Orders        Start     Ordered   12/26/15 1802  Full code   Continuous     12/26/15 1801    Code Status History    Date Active Date Inactive Code Status Order ID Comments User Context   05/07/2015 10:33 PM 05/12/2015  5:30 PM Full Code [id number]  Phillips Grout, MD Inpatient   07/25/2014  7:08 PM 08/06/2014  6:50 PM Full Code [id number]  Bonnielee Haff, MD Inpatient      Family Communication: Discussed in detail with the patient's wife Joaquim Lai , all imaging results, lab results explained to the patient   Disposition Plan:  Continue D5, monitor CBGs, labs, PT OT eval     Consultants:  None  Procedures:  None  Antibiotics: Anti-infectives    Start     Dose/Rate Route Frequency Ordered Stop   12/26/15 2200  ciprofloxacin (CIPRO) tablet 500 mg     500 mg Oral 2 times daily 12/26/15 1801     12/26/15 2200  doxycycline (VIBRA-TABS) tablet 100 mg     100 mg Oral 2 times daily 12/26/15 1801     12/26/15 1215  vancomycin (VANCOCIN) IVPB 1000 mg/200 mL premix  1,000 mg 200 mL/hr over 60 Minutes Intravenous  Once 12/26/15 1212 12/26/15 1433   12/26/15 1215  piperacillin-tazobactam (ZOSYN) IVPB 3.375 g     3.375 g 12.5 mL/hr over 240 Minutes Intravenous  Once 12/26/15 1212 12/26/15 1306         HPI/Subjective:  wants to go home   Objective: Filed Vitals:   12/26/15 1807 12/26/15 1945 12/27/15 0023 12/27/15 0400  BP: 110/42 108/73 103/47 103/46  Pulse: 111 64 82 82  Temp: 97.4 F (36.3 C) 97.3 F (36.3 C) 97.5 F (36.4 C) 97.9 F (36.6 C)  TempSrc: Oral Oral Oral Oral  Resp: 16 20 10 16   Height:  6\' 4"  (1.93 m)     Weight: 163.2 kg (359 lb 12.7 oz)     SpO2: 94% 90% 98% 96%    Intake/Output Summary (Last 24 hours) at 12/27/15 0826 Last data filed at 12/27/15 [id number]  Gross per 24 hour  Intake 1176.67 ml  Output   1550 ml  Net -373.33 ml    Exam:  General: No acute respiratory distress Lungs: Clear to auscultation bilaterally without wheezes or crackles Cardiovascular: Regular rate and rhythm without murmur gallop or rub normal S1 and S2 Abdomen: Nontender, nondistended, soft, bowel sounds positive, no rebound, no ascites, no appreciable mass Patient's right foot has had some recent amputation of the toes. This looks like it's healing appropriately      Data Review   Micro Results Recent Results (from the past 240 hour(s))  MRSA PCR Screening     Status: None   Collection Time: 12/26/15  6:27 PM  Result Value Ref Range Status   MRSA by PCR NEGATIVE NEGATIVE Final    Comment:        The GeneXpert MRSA Assay (FDA approved for NASAL specimens only), is one component of a comprehensive MRSA colonization surveillance program. It is not intended to diagnose MRSA infection nor to guide or monitor treatment for MRSA infections.     Radiology Reports Dg Chest Port 1 View  12/26/2015  CLINICAL DATA:  Hypertension and hypoglycemia 1 day. Foot surgery 1 week ago. EXAM: PORTABLE CHEST 1 VIEW COMPARISON:  07/25/2014 FINDINGS: Lungs are adequately inflated without consolidation or effusion. Mild stable cardiomegaly. Calcified plaque over the aortic arch. Prominent overlying soft tissues. Degenerative change of the spine. IMPRESSION: Hypoinflation without acute cardiopulmonary disease. Mild stable cardiomegaly. Electronically Signed   By: Marin Olp M.D.   On: 12/26/2015 14:16     CBC  Recent Labs Lab 12/26/15 1153 12/26/15 1242 12/27/15 0302  WBC 10.4  --  6.6  HGB 11.5* 11.9* 10.6*  HCT 36.6* 35.0* 33.0*  PLT 259  --  229  MCV 96.8  --  98.2  MCH 30.4  --  31.5   MCHC 31.4  --  32.1  RDW 15.3  --  15.4  LYMPHSABS 0.5*  --   --   MONOABS 0.8  --   --   EOSABS 0.1  --   --   BASOSABS 0.0  --   --     Chemistries   Recent Labs Lab 12/26/15 1153 12/26/15 1242 12/27/15 0302  NA 136 135 136  K 5.1 4.3 4.3  CL 99* 97* 102  CO2 26  --  27  GLUCOSE 56* 59* 107*  BUN 31* 35* 25*  CREATININE 1.98* 1.80* 1.38*  CALCIUM 8.9  --  8.1*  AST 23  --   --   ALT 13*  --   --  ALKPHOS 55  --   --   BILITOT 0.5  --   --    ------------------------------------------------------------------------------------------------------------------ estimated creatinine clearance is 78 mL/min (by C-G formula based on Cr of 1.38). ------------------------------------------------------------------------------------------------------------------ No results for input(s): HGBA1C in the last 72 hours. ------------------------------------------------------------------------------------------------------------------ No results for input(s): CHOL, HDL, LDLCALC, TRIG, CHOLHDL, LDLDIRECT in the last 72 hours. ------------------------------------------------------------------------------------------------------------------ No results for input(s): TSH, T4TOTAL, T3FREE, THYROIDAB in the last 72 hours.  Invalid input(s): FREET3 ------------------------------------------------------------------------------------------------------------------ No results for input(s): VITAMINB12, FOLATE, FERRITIN, TIBC, IRON, RETICCTPCT in the last 72 hours.  Coagulation profile No results for input(s): INR, PROTIME in the last 168 hours.  No results for input(s): DDIMER in the last 72 hours.  Cardiac Enzymes No results for input(s): CKMB, TROPONINI, MYOGLOBIN in the last 168 hours.  Invalid input(s): CK ------------------------------------------------------------------------------------------------------------------ Invalid input(s): POCBNP   CBG:  Recent Labs Lab 12/26/15 2111  12/27/15 0021 12/27/15 0205 12/27/15 0359 12/27/15 0656  GLUCAP 65 105* 104* 106* 86       Studies: Dg Chest Port 1 View  12/26/2015  CLINICAL DATA:  Hypertension and hypoglycemia 1 day. Foot surgery 1 week ago. EXAM: PORTABLE CHEST 1 VIEW COMPARISON:  07/25/2014 FINDINGS: Lungs are adequately inflated without consolidation or effusion. Mild stable cardiomegaly. Calcified plaque over the aortic arch. Prominent overlying soft tissues. Degenerative change of the spine. IMPRESSION: Hypoinflation without acute cardiopulmonary disease. Mild stable cardiomegaly. Electronically Signed   By: Marin Olp M.D.   On: 12/26/2015 14:16      Lab Results  Component Value Date   HGBA1C 5.8 10/21/2015   HGBA1C 6.3 04/18/2015   HGBA1C 6.8* 12/17/2014   Lab Results  Component Value Date   MICROALBUR 1.4 08/28/2010   LDLCALC 71 09/12/2014   CREATININE 1.38* 12/27/2015       Scheduled Meds: . aspirin EC  81 mg Oral Daily  . ciprofloxacin  500 mg Oral BID  . clopidogrel  75 mg Oral Daily  . dextrose   Intravenous Once  . doxycycline  100 mg Oral BID  . enoxaparin (LOVENOX) injection  0.5 mg/kg Subcutaneous Q24H  . potassium chloride SA  40 mEq Oral Daily   Continuous Infusions: . dextrose 5 % and 0.45% NaCl 100 mL/hr at 12/27/15 0357    Active Problems:   Hypotension   Hypoglycemia   Gangrene of toe ([name])   Dehydration   Acute renal failure ([name])   Acute renal failure (ARF) ([name])   Pressure ulcer    Time spent: 45 minutes   [facility] Pager [phone]. If 7PM-7AM, please contact night-coverage at [email or web address], password [facility] 12/27/2015, 8:26 AM  LOS: 1 day

## 2015-12-28 DIAGNOSIS — E860 Dehydration: Secondary | ICD-10-CM

## 2015-12-28 DIAGNOSIS — N170 Acute kidney failure with tubular necrosis: Secondary | ICD-10-CM

## 2015-12-28 DIAGNOSIS — E162 Hypoglycemia, unspecified: Secondary | ICD-10-CM

## 2015-12-28 DIAGNOSIS — I96 Gangrene, not elsewhere classified: Secondary | ICD-10-CM

## 2015-12-28 LAB — CBC
HEMATOCRIT: 32.6 % — AB (ref 39.0–52.0)
HEMATOCRIT: 32.4 % — AB (ref 39.0–52.0)
HEMOGLOBIN: 10.5 g/dL — AB (ref 13.0–17.0)
Hemoglobin: 10.3 g/dL — ABNORMAL LOW (ref 13.0–17.0)
MCH: 31.3 pg (ref 26.0–34.0)
MCH: 30.7 pg (ref 26.0–34.0)
MCHC: 32.2 g/dL (ref 30.0–36.0)
MCHC: 31.8 g/dL (ref 30.0–36.0)
MCV: 97 fL (ref 78.0–100.0)
MCV: 96.4 fL (ref 78.0–100.0)
PLATELETS: 208 10*3/uL (ref 150–400)
Platelets: 201 10*3/uL (ref 150–400)
RBC: 3.36 MIL/uL — AB (ref 4.22–5.81)
RBC: 3.36 MIL/uL — AB (ref 4.22–5.81)
RDW: 15.3 % (ref 11.5–15.5)
RDW: 15.2 % (ref 11.5–15.5)
WBC: 6.3 10*3/uL (ref 4.0–10.5)
WBC: 6.3 10*3/uL (ref 4.0–10.5)

## 2015-12-28 LAB — COMPREHENSIVE METABOLIC PANEL
ALT: 11 U/L — AB (ref 17–63)
AST: 19 U/L (ref 15–41)
Albumin: 2.8 g/dL — ABNORMAL LOW (ref 3.5–5.0)
Alkaline Phosphatase: 45 U/L (ref 38–126)
Anion gap: 3 — ABNORMAL LOW (ref 5–15)
BILIRUBIN TOTAL: 0.8 mg/dL (ref 0.3–1.2)
BUN: 12 mg/dL (ref 6–20)
CO2: 27 mmol/L (ref 22–32)
CREATININE: 0.87 mg/dL (ref 0.61–1.24)
Calcium: 8.6 mg/dL — ABNORMAL LOW (ref 8.9–10.3)
Chloride: 108 mmol/L (ref 101–111)
GFR calc Af Amer: >60 mL/min (ref >60)
GLUCOSE: 223 mg/dL — AB (ref 65–99)
Potassium: 4.7 mmol/L (ref 3.5–5.1)
Sodium: 138 mmol/L (ref 135–145)
TOTAL PROTEIN: 5.8 g/dL — AB (ref 6.5–8.1)

## 2015-12-28 LAB — GLUCOSE, CAPILLARY
GLUCOSE-CAPILLARY: 145 mg/dL — AB (ref 65–99)
GLUCOSE-CAPILLARY: 229 mg/dL — AB (ref 65–99)
Glucose-Capillary: 196 mg/dL — ABNORMAL HIGH (ref 65–99)
Glucose-Capillary: 206 mg/dL — ABNORMAL HIGH (ref 65–99)
Glucose-Capillary: 187 mg/dL — ABNORMAL HIGH (ref 65–99)
Glucose-Capillary: 266 mg/dL — ABNORMAL HIGH (ref 65–99)
Glucose-Capillary: 131 mg/dL — ABNORMAL HIGH (ref 65–99)

## 2015-12-28 LAB — URINE CULTURE: Culture: 3000

## 2015-12-28 MED ORDER — SODIUM CHLORIDE 0.9 % IV SOLN
INTRAVENOUS | Status: AC
  Administered 2015-12-28 – 2015-12-29 (×2): via INTRAVENOUS

## 2015-12-28 MED ORDER — CHOLECALCIFEROL 25 MCG (1000 UT) PO TABS
1000.0000 [IU] | ORAL_TABLET | Freq: Every day | ORAL | Status: DC
  Administered 2015-12-28: 1000 [IU] via ORAL
  Filled 2015-12-28 (×2): qty 1

## 2015-12-28 MED ORDER — INSULIN ASPART 100 UNIT/ML ~~LOC~~ SOLN: 0.0000 [IU] | Freq: Every day | SUBCUTANEOUS | Status: DC

## 2015-12-28 MED ORDER — INSULIN ASPART 100 UNIT/ML ~~LOC~~ SOLN
0.0000 [IU] | Freq: Three times a day (TID) | SUBCUTANEOUS | Status: DC
  Administered 2015-12-28: 5 [IU] via SUBCUTANEOUS
  Administered 2015-12-28: 1 [IU] via SUBCUTANEOUS
  Administered 2015-12-29: 2 [IU] via SUBCUTANEOUS

## 2015-12-28 MED ORDER — INSULIN ASPART 100 UNIT/ML ~~LOC~~ SOLN: 0.0000 [IU] | Freq: Three times a day (TID) | SUBCUTANEOUS | Status: DC

## 2015-12-28 MED ORDER — GLIMEPIRIDE 2 MG PO TABS
2.0000 mg | ORAL_TABLET | Freq: Two times a day (BID) | ORAL | Status: DC
  Administered 2015-12-28 – 2015-12-29 (×2): 2 mg via ORAL
  Filled 2015-12-28 (×4): qty 1

## 2015-12-28 MED ORDER — VITAMIN B-12 1000 MCG PO TABS
1000.0000 ug | ORAL_TABLET | Freq: Every day | ORAL | Status: DC
  Administered 2015-12-28: 1000 ug via ORAL
  Filled 2015-12-28: qty 1

## 2015-12-28 MED ORDER — PIOGLITAZONE HCL 45 MG PO TABS
45.0000 mg | ORAL_TABLET | Freq: Every day | ORAL | Status: DC
  Administered 2015-12-28: 45 mg via ORAL
  Filled 2015-12-28 (×2): qty 1

## 2015-12-28 MED ORDER — MOMETASONE FURO-FORMOTEROL FUM 100-5 MCG/ACT IN AERO
2.0000 | INHALATION_SPRAY | Freq: Two times a day (BID) | RESPIRATORY_TRACT | Status: DC
  Filled 2015-12-28: qty 8.8

## 2015-12-28 MED ORDER — AMLODIPINE BESYLATE 10 MG PO TABS
10.0000 mg | ORAL_TABLET | Freq: Every day | ORAL | Status: DC
  Administered 2015-12-28: 10 mg via ORAL
  Filled 2015-12-28: qty 1

## 2015-12-28 MED ORDER — METOPROLOL TARTRATE 50 MG PO TABS
50.0000 mg | ORAL_TABLET | Freq: Two times a day (BID) | ORAL | Status: DC
  Administered 2015-12-28 (×2): 50 mg via ORAL
  Filled 2015-12-28 (×2): qty 1

## 2015-12-28 MED ORDER — PRAVASTATIN SODIUM 40 MG PO TABS
40.0000 mg | ORAL_TABLET | Freq: Every day | ORAL | Status: DC
  Administered 2015-12-28: 40 mg via ORAL
  Filled 2015-12-28: qty 1

## 2015-12-28 NOTE — Progress Notes (Signed)
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 Earleen Newport on 12/24/2015,He presents to the Emergency department today after being seen in Dr. Mellody Drown office for a recheck. sent from podiatry office for lethargy,Pt was unsteady on his feet and weak per his wife. Pt required help walking. Pt was sleepy and reported increased tiredness, was noticed to have hypoglycemia, hypotension, CABG 56 initially, blood pressure 75 /53, a febrile NAD, no leukocytosis, does not appear toxic, hospitalist requested to admit for hypoglycemia and hypotension.   Assessment and plan  - Dehydration with hypotension and acute renal failure. Resolved after IV fluids, continue hold diuretic and ARB.   - Hypoglycemia : Caused by oral hypoglycemics in the setting of renal failure, hypoglycemia now resolved, A1c 6.8, resume oral hypoglycemic at half the home dose along with gentle sliding scale and monitor. Stop D5 drip.  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 Continue present antibiotics will discharge on oral antibiotics likely in the morning with outpatient follow-up with his surgeon post discharge.  - Positive urinalysis: No symptoms or signs of UTI, follow on urine culture, on antibiotics for foot infection.  - History of coronary artery disease; he denies any chest pain or shortness of breath, continue with aspirin and Plavix  - History of paroxysmal atrial fibrillation: By reviewing previous records in the past, patient is not  anticoagulated due to a personal choice at as per his PCP documentation and previous admissions. Resume home dose beta blocker and monitor.    DVT prophylaxsis  Lovenox  Code Status:  Full Code   Family Communication: No family present, previous physician had discussed the case with wife on 12/27/2015  Disposition Plan:  PT eval discharge in the morning     Consultants:  None  Procedures:  None  Antibiotics: Anti-infectives    Start     Dose/Rate Route Frequency Ordered Stop   12/28/15 1200  vancomycin (VANCOCIN) 1,750 mg in sodium chloride 0.9 % 500 mL IVPB     1,750 mg 250 mL/hr over 120 Minutes Intravenous Every 24 hours 12/27/15 1012     12/27/15 1600  piperacillin-tazobactam (ZOSYN) IVPB 3.375 g     3.375 g 12.5 mL/hr over 240 Minutes Intravenous Every 8 hours 12/27/15 1012     12/27/15 1200  vancomycin (VANCOCIN) 2,500 mg in sodium chloride 0.9 % 500 mL IVPB     2,500 mg 250 mL/hr over 120 Minutes Intravenous  Once 12/27/15 1012 12/27/15 1619   12/27/15 1100  piperacillin-tazobactam (ZOSYN) IVPB 3.375 g     3.375 g 100 mL/hr over 30 Minutes Intravenous  Once 12/27/15 1012 12/27/15 1102   12/26/15 2200  ciprofloxacin (CIPRO) tablet 500 mg  Status:  Discontinued     500 mg Oral 2 times daily 12/26/15 1801 12/27/15 0955   12/26/15 2200  doxycycline (VIBRA-TABS) tablet 100 mg  Status:  Discontinued     100 mg Oral 2 times daily 12/26/15 1801 12/27/15 0955   12/26/15 1215  vancomycin (VANCOCIN) IVPB 1000 mg/200 mL premix     1,000 mg 200  mL/hr over 60 Minutes Intravenous  Once 12/26/15 1212 12/26/15 1433   12/26/15 1215  piperacillin-tazobactam (ZOSYN) IVPB 3.375 g     3.375 g 12.5 mL/hr over 240 Minutes Intravenous  Once 12/26/15 1212 12/26/15 1306         HPI/Subjective: Sitting in chair, denies any headache, no fever or chills. Denies any chest or abdominal pain. No focal weakness  Objective: Filed Vitals:   12/28/15 0400 12/28/15 0500 12/28/15 0600  12/28/15 0750  BP: 139/70 155/79 159/66 176/53  Pulse: 96 95 112 108  Temp:    98 F (36.7 C)  TempSrc:    Oral  Resp: 16 17 19 21   Height:      Weight:      SpO2: 96% 96% 96% 98%    Intake/Output Summary (Last 24 hours) at 12/28/15 0947 Last data filed at 12/28/15 0900  Gross per 24 hour  Intake   3350 ml  Output   3801 ml  Net   -451 ml    Exam:  General: No acute respiratory distress Lungs: Clear to auscultation bilaterally without wheezes or crackles Cardiovascular: Regular rate and rhythm without murmur gallop or rub normal S1 and S2 Abdomen: Nontender, nondistended, soft, bowel sounds positive, no rebound, no ascites, no appreciable mass Patient's right foot has had recent Addition of second and third toe, under bandage, no surrounding cellulitis     Data Review   Micro Results Recent Results (from the past 240 hour(s))  Culture, blood (routine x 2)     Status: None (Preliminary result)   Collection Time: 12/26/15 11:53 AM  Result Value Ref Range Status   Specimen Description BLOOD LEFT ANTECUBITAL  Final   Special Requests BOTTLES DRAWN AEROBIC AND ANAEROBIC 5CC  Final   Culture NO GROWTH 1 DAY  Final   Report Status PENDING  Incomplete  Culture, blood (routine x 2)     Status: None (Preliminary result)   Collection Time: 12/26/15 12:34 PM  Result Value Ref Range Status   Specimen Description BLOOD LEFT HAND  Final   Special Requests IN PEDIATRIC BOTTLE 2CCS  Final   Culture NO GROWTH 1 DAY  Final   Report Status PENDING  Incomplete  Urine culture     Status: None (Preliminary result)   Collection Time: 12/26/15  2:22 PM  Result Value Ref Range Status   Specimen Description URINE, RANDOM  Final   Special Requests NONE  Final   Culture NO GROWTH < 24 HOURS  Final   Report Status PENDING  Incomplete  MRSA PCR Screening     Status: None   Collection Time: 12/26/15  6:27 PM  Result Value Ref Range Status   MRSA by PCR NEGATIVE NEGATIVE Final    Comment:         The GeneXpert MRSA Assay (FDA approved for NASAL specimens only), is one component of a comprehensive MRSA colonization surveillance program. It is not intended to diagnose MRSA infection nor to guide or monitor treatment for MRSA infections.     Radiology Reports Dg Chest Port 1 View  12/26/2015  CLINICAL DATA:  Hypertension and hypoglycemia 1 day. Foot surgery 1 week ago. EXAM: PORTABLE CHEST 1 VIEW COMPARISON:  07/25/2014 FINDINGS: Lungs are adequately inflated without consolidation or effusion. Mild stable cardiomegaly. Calcified plaque over the aortic arch. Prominent overlying soft tissues. Degenerative change of the spine. IMPRESSION: Hypoinflation without acute cardiopulmonary disease. Mild stable cardiomegaly. Electronically Signed   By: Marin Olp M.D.  On: 12/26/2015 14:16     CBC  Recent Labs Lab 12/26/15 1153 12/26/15 1242 12/27/15 0302 12/28/15 0508 12/28/15 0745  WBC 10.4  --  6.6 6.3 6.3  HGB 11.5* 11.9* 10.6* 10.5* 10.3*  HCT 36.6* 35.0* 33.0* 32.6* 32.4*  PLT 259  --  229 201 208  MCV 96.8  --  98.2 97.0 96.4  MCH 30.4  --  31.5 31.3 30.7  MCHC 31.4  --  32.1 32.2 31.8  RDW 15.3  --  15.4 15.3 15.2  LYMPHSABS 0.5*  --   --   --   --   MONOABS 0.8  --   --   --   --   EOSABS 0.1  --   --   --   --   BASOSABS 0.0  --   --   --   --     Chemistries   Recent Labs Lab 12/26/15 1153 12/26/15 1242 12/27/15 0302 12/28/15 0508  NA 136 135 136 138  K 5.1 4.3 4.3 4.7  CL 99* 97* 102 108  CO2 26  --  27 27  GLUCOSE 56* 59* 107* 223*  BUN 31* 35* 25* 12  CREATININE 1.98* 1.80* 1.38* 0.87  CALCIUM 8.9  --  8.1* 8.6*  AST 23  --   --  19  ALT 13*  --   --  11*  ALKPHOS 55  --   --  45  BILITOT 0.5  --   --  0.8   ------------------------------------------------------------------------------------------------------------------ estimated creatinine clearance is 123.7 mL/min (by C-G formula based on Cr of  0.87). ------------------------------------------------------------------------------------------------------------------ No results for input(s): HGBA1C in the last 72 hours. ------------------------------------------------------------------------------------------------------------------ No results for input(s): CHOL, HDL, LDLCALC, TRIG, CHOLHDL, LDLDIRECT in the last 72 hours. ------------------------------------------------------------------------------------------------------------------ No results for input(s): TSH, T4TOTAL, T3FREE, THYROIDAB in the last 72 hours.  Invalid input(s): FREET3 ------------------------------------------------------------------------------------------------------------------ No results for input(s): VITAMINB12, FOLATE, FERRITIN, TIBC, IRON, RETICCTPCT in the last 72 hours.  Coagulation profile No results for input(s): INR, PROTIME in the last 168 hours.  No results for input(s): DDIMER in the last 72 hours.  Cardiac Enzymes No results for input(s): CKMB, TROPONINI, MYOGLOBIN in the last 168 hours.  Invalid input(s): CK ------------------------------------------------------------------------------------------------------------------ Invalid input(s): POCBNP   CBG:  Recent Labs Lab 12/27/15 2036 12/27/15 2350 12/28/15 0226 12/28/15 0442 12/28/15 0749  GLUCAP 176* 229* 196* 206* 187*       Studies: Dg Chest Port 1 View  12/26/2015  CLINICAL DATA:  Hypertension and hypoglycemia 1 day. Foot surgery 1 week ago. EXAM: PORTABLE CHEST 1 VIEW COMPARISON:  07/25/2014 FINDINGS: Lungs are adequately inflated without consolidation or effusion. Mild stable cardiomegaly. Calcified plaque over the aortic arch. Prominent overlying soft tissues. Degenerative change of the spine. IMPRESSION: Hypoinflation without acute cardiopulmonary disease. Mild stable cardiomegaly. Electronically Signed   By: Marin Olp M.D.   On: 12/26/2015 14:16      Lab Results   Component Value Date   HGBA1C 5.8 10/21/2015   HGBA1C 6.3 04/18/2015   HGBA1C 6.8* 12/17/2014   Lab Results  Component Value Date   MICROALBUR 1.4 08/28/2010   LDLCALC 71 09/12/2014   CREATININE 0.87 12/28/2015       Scheduled Meds: . aspirin EC  81 mg Oral Daily  . Cholecalciferol  1,000 Units Oral Daily  . clopidogrel  75 mg Oral Daily  . enoxaparin (LOVENOX) injection  0.5 mg/kg Subcutaneous Q24H  . glimepiride  2 mg Oral BID  . insulin aspart  0-5 Units Subcutaneous QHS  . insulin aspart  0-9 Units Subcutaneous TID WC  . metoprolol  50 mg Oral BID  . mometasone-formoterol  2 puff Inhalation BID  . pioglitazone  45 mg Oral Daily  . piperacillin-tazobactam (ZOSYN)  IV  3.375 g Intravenous Q8H  . potassium chloride SA  40 mEq Oral Daily  . pravastatin  40 mg Oral Daily  . vancomycin  1,750 mg Intravenous Q24H  . vitamin B-12  1,000 mcg Oral Daily   Continuous Infusions: . sodium chloride      Active Problems:   Hypotension   Hypoglycemia   Gangrene of toe ([name])   Dehydration   Acute renal failure ([name])   Acute renal failure (ARF) ([name])   Pressure ulcer    Time spent: 45 minutes   [facility] Pager [phone]. If 7PM-7AM, please contact night-coverage at [email or web address], password [facility] 12/28/2015, 9:47 AM  LOS: 2 days

## 2015-12-28 NOTE — Progress Notes (Signed)
Patient continually taken off CPAP to use the bathroom.  Tried to educate the patient on why he needs wear it at night.  Still refusing to wear it RT to talk to the patient.

## 2015-12-28 NOTE — Progress Notes (Signed)
Patient set-up on CPAP with full face mask, room air.  8 cmH20 used per patient's home regimen.  Patient is familiar with equipment and procedure.

## 2015-12-28 NOTE — Progress Notes (Signed)
Utilization review completed.  

## 2015-12-28 NOTE — Plan of Care (Signed)
Problem: Education: Goal: Knowledge of [facility] [place] [facility] Education information/materials will improve Outcome: Progressing Discussed CPAP advantages of wearing

## 2015-12-28 NOTE — Plan of Care (Signed)
Problem: Education: Goal: Knowledge of [facility] [facility] Education information/materials will improve Outcome: Progressing Patient stated he didn't want to wear CPAP tonight

## 2015-12-28 NOTE — Progress Notes (Signed)
Shift Note:  Patient's blood sugar have been averaging in the 200's; he is still getting dextrose in his IVF may need to be decreased or changed.  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

## 2015-12-29 LAB — BASIC METABOLIC PANEL
Anion gap: 4 — ABNORMAL LOW (ref 5–15)
BUN: 6 mg/dL (ref 6–20)
CHLORIDE: 107 mmol/L (ref 101–111)
CO2: 28 mmol/L (ref 22–32)
CREATININE: 0.74 mg/dL (ref 0.61–1.24)
Calcium: 8.5 mg/dL — ABNORMAL LOW (ref 8.9–10.3)
GFR calc Af Amer: >60 mL/min (ref >60)
GFR calc non Af Amer: >60 mL/min (ref >60)
Glucose, Bld: 160 mg/dL — ABNORMAL HIGH (ref 65–99)
Potassium: 4.5 mmol/L (ref 3.5–5.1)
SODIUM: 139 mmol/L (ref 135–145)

## 2015-12-29 LAB — GLUCOSE, CAPILLARY
GLUCOSE-CAPILLARY: 120 mg/dL — AB (ref 65–99)
Glucose-Capillary: 194 mg/dL — ABNORMAL HIGH (ref 65–99)

## 2015-12-29 MED ORDER — AMLODIPINE BESYLATE 10 MG PO TABS: 10.0000 mg | ORAL_TABLET | Freq: Every day | ORAL | Status: DC

## 2015-12-29 MED ORDER — FUROSEMIDE 80 MG PO TABS: 40.0000 mg | ORAL_TABLET | Freq: Every day | ORAL | Status: DC

## 2015-12-29 MED ORDER — GLIMEPIRIDE 2 MG PO TABS: 2.0000 mg | ORAL_TABLET | Freq: Two times a day (BID) | ORAL | Status: DC

## 2015-12-29 MED ORDER — DOXYCYCLINE HYCLATE 100 MG PO CAPS: 100.0000 mg | ORAL_CAPSULE | Freq: Two times a day (BID) | ORAL | Status: DC

## 2015-12-29 MED ORDER — LOSARTAN POTASSIUM 100 MG PO TABS: 50.0000 mg | ORAL_TABLET | Freq: Every day | ORAL | Status: DC

## 2015-12-29 MED ORDER — PIOGLITAZONE HCL 30 MG PO TABS: 30.0000 mg | ORAL_TABLET | Freq: Every day | ORAL | Status: DC

## 2015-12-29 MED ORDER — CEPHALEXIN 500 MG PO CAPS: 500.0000 mg | ORAL_CAPSULE | Freq: Four times a day (QID) | ORAL | Status: DC

## 2015-12-29 NOTE — Discharge Summary (Signed)
Charles [name], is a 75 y.o. male  DOB 1941/08/01  MRN [id number].  Admission date:  12/26/2015  Admitting Physician  Albertine Patricia, MD  Discharge Date:  12/29/2015   Primary MD  [name], MD  Recommendations for primary care physician for things to follow:   Monitor glycemic control and adjust medications as needed.  Repeat CBC, BMP in 3-5 days  Must follow with his podiatrist/surgeon within a week   Admission Diagnosis  Hypotension, unspecified [I95.9]   Discharge Diagnosis  Hypotension, unspecified [I95.9]     Active Problems:   Hypotension   Hypoglycemia   Gangrene of toe ([name])   Dehydration   Acute renal failure ([name])   Acute renal failure (ARF) ([name])   Pressure ulcer      Past Medical History  Diagnosis Date  . CAD (coronary artery disease)     a. Approx. 2000 - MI. Cath showed single vessel disease, PTCA dLAD/medical management. ;  b. NSTEMI 11/13 => LHC: prox and mid LAD 30%, dLAD 60%, pCFX 30%, inf branch of OM 40%, mRCA 50-60%, EF 55-60%; IVUS attempted for RCA but not successful; anatomy felt stable from 2000 => med Rx.  . Diverticulitis   . Hypertension   . Morbid obesity ([place])   . Cholelithiasis   . Hypercholesterolemia   . ED (erectile dysfunction)   . OSA on CPAP   . Type II diabetes mellitus ([place])   . History of stomach ulcers   . History of MRSA infection     "little toe right foot" (10/26/2012)  . Tunnel vision     "both eyes since stroke"  . Claustrophobia   . B12 deficiency   . Diabetic neuropathy ([place])   . PAF (paroxysmal atrial fibrillation) ([name])     a. confirmed by event monitor. b. 02/2014 rash on Coumadin, patient decided to discontinue Xarelto due to possible rash, cost and lawyers ads on TV, agreed to take Plavix.  . Cellulitis 04/2015    rt lower  extremity  . CVA (cerebral vascular accident) ([place]) ~ 2002    "tunnel vision; no peripheral vision; lost sense of direction; stability problems since" (12/26/2015)    Past Surgical History  Procedure Laterality Date  . Toe amputation  2006; 2009    "Dr. Blenda Mounts; big toe left foot; little toe on right foot" (10/26/2012)  . Cardiac catheterization  1990's  . Cerebral angiogram  ~ 2000  . Left heart catheterization with coronary angiogram N/A 10/27/2012    Procedure: LEFT HEART CATHETERIZATION WITH CORONARY ANGIOGRAM;  Surgeon: Burnell Blanks, MD;  Location: [facility] CATH LAB;  Service: Cardiovascular;  Laterality: N/A;  . Toe amputation Right 12/24/2015    2nd & 3 toes/notes 1/13//2017       HPI  from the history and physical done on the day of admission:    Charles [name] is a 75 y.o. male with history of Diabetes Mellitus Type 2, Hypertension, CAD, Hypercholesteremia, neuropathy, Mrsa to right foot. He presents to the Emergency department  today after being seen in Dr. Mellody Drown office for a recheck. Pt reports he had surgery to remove 2nd and 3rd toes on 1/11. Pt was found to be hypotensive in Dr. Mellody Drown office. Pt was unsteady on his feet and weak per his wife. Pt required help walking. Pt was sleepy and reported increased tiredness. Pt has experienced similar feeling when his glucose is low. He reports he was well yesterday. Pt took his insulin before bed last pm. Pt is currently on antibiotics. Pt denies fever, no chills. Appetite has been good but he did not eat this am.      Hospital Course:     Dehydration with hypotension and acute renal failure. Resolved after IV fluids, have cut back ARB and diuretic dose upon discharge, monitor BMP, blood pressure and fluid status closely.  - Hypoglycemia : Caused by oral hypoglycemics in the setting of renal failure, hypoglycemia now resolved, A1c 6.8, resumed oral hypoglycemic less than home dose, requested to check CBGs before  every meal C at bedtime maintain a logbook ensure to PCP in 3 days.  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 Continue present antibiotics will discharge on oral antibiotics for 5 more days with close outpatient follow-up with PCP and his surgeon.  - Positive urinalysis: No symptoms or signs of UTI, culture negative.  - History of coronary artery disease; he denies any chest pain or shortness of breath, continue with aspirin and Plavix.  - History of paroxysmal atrial fibrillation: By reviewing previous records in the past, patient is not anticoagulated due to a personal choice at as per his PCP documentation and previous admissions. Resume home dose beta blocker and monitor.       Discharge Condition: Stable  Follow UP  Follow-up Information    Follow up with [name], MD. Schedule an appointment as soon as possible for a visit in 3 days.   Specialty:  Internal Medicine   Why:  And your podiatrist/surgeon   Contact information:   [address] [place] [address] [phone]        Consults obtained - None  Diet and Activity recommendation: See Discharge Instructions below  Discharge Instructions       Discharge Instructions    Discharge instructions    Complete by:  As directed   Follow with Primary MD [name], MD in 3 days   Get CBC, CMP, 2 view Chest X ray checked  by Primary MD next visit.    Activity: As tolerated with Full fall precautions use walker/cane & assistance as needed   Disposition Home     Diet:   Heart Healthy Low Carb.  Accuchecks 4 times/day, Once in AM empty stomach and then before each meal. Log in all results and show them to your Prim.MD in 3 days. If any glucose reading is under 80 or above 300 call your Prim MD immidiately. Follow Low glucose instructions for glucose under 80 as  instructed.   For Heart failure patients - Check your Weight same time everyday, if you gain over 2 pounds, or you develop in leg swelling, experience more shortness of breath or chest pain, call your Primary MD immediately. Follow Cardiac Low Salt Diet and 1.5 lit/day fluid restriction.   On your next visit with your primary care physician please Get Medicines reviewed and adjusted.   Please request your Prim.MD  to go over all Hospital Tests and Procedure/Radiological results at the follow up, please get all Hospital records sent to your Prim MD by signing hospital release before you go home.   If you experience worsening of your admission symptoms, develop shortness of breath, life threatening emergency, suicidal or homicidal thoughts you must seek medical attention immediately by calling 911 or calling your MD immediately  if symptoms less severe.  You Must read complete instructions/literature along with all the possible adverse reactions/side effects for all the Medicines you take and that have been prescribed to you. Take any new Medicines after you have completely understood and accpet all the possible adverse reactions/side effects.   Do not drive, operating heavy machinery, perform activities at heights, swimming or participation in water activities or provide baby sitting services if your were admitted for syncope or siezures until you have seen by Primary MD or a Neurologist and advised to do so again.  Do not drive when taking Pain medications.    Do not take more than prescribed Pain, Sleep and Anxiety Medications  Special Instructions: If you have smoked or chewed Tobacco  in the last 2 yrs please stop smoking, stop any regular Alcohol  and or any Recreational drug use.  Wear Seat belts while driving.   Please note  You were cared for by a hospitalist during your hospital stay. If you have any questions about your discharge medications or the care you received while you  were in the hospital after you are discharged, you can call the unit and asked to speak with the hospitalist on call if the hospitalist that took care of you is not available. Once you are discharged, your primary care physician will handle any further medical issues. Please note that NO REFILLS for any discharge medications will be authorized once you are discharged, as it is imperative that you return to your primary care physician (or establish a relationship with a primary care physician if you do not have one) for your aftercare needs so that they can reassess your need for medications and monitor your lab values.     Increase activity slowly    Complete by:  As directed              Discharge Medications       Medication List    STOP taking these medications        ciprofloxacin 500 MG tablet  Commonly known as:  CIPRO     clindamycin 300 MG capsule  Commonly known as:  CLEOCIN      TAKE these medications        acetaminophen 325 MG tablet  Commonly known as:  TYLENOL  Take 2 tablets (650 mg total) by mouth every 6 (six) hours as needed for mild pain, fever or headache (or Fever >/= 101).     amLODipine 10 MG tablet  Commonly known as:  NORVASC  Take 1 tablet (10 mg total) by mouth daily.     aspirin 81 MG tablet  Take 81 mg by mouth daily.     cephALEXin 500 MG capsule  Commonly known as:  KEFLEX  Take 1 capsule (500 mg total) by mouth 4 (four) times daily.     clopidogrel 75 MG tablet  Commonly known as:  PLAVIX  Take 1 tablet (75 mg total) by mouth daily.     doxycycline 100 MG capsule  Commonly known as:  VIBRAMYCIN  Take 1 capsule (100 mg total) by mouth  2 (two) times daily.     EQL VITAMIN D3 1000 units tablet  Generic drug:  Cholecalciferol  Take 1,000 Units by mouth daily.     furosemide 80 MG tablet  Commonly known as:  LASIX  Take 0.5 tablets (40 mg total) by mouth daily.     gabapentin 300 MG capsule  Commonly known as:  NEURONTIN  TAKE 1  CAPSULE THREE TIMES DAILY AS NEEDED FOR NERVE PAIN     glimepiride 2 MG tablet  Commonly known as:  AMARYL  Take 1 tablet (2 mg total) by mouth 2 (two) times daily before a meal.     HUMALOG KWIKPEN 100 UNIT/ML KiwkPen  Generic drug:  insulin lispro  Inject 5-20 Units into the skin 4 (four) times daily - after meals and at bedtime. Per sliding scale 100-120 per cbg     HYDROcodone-acetaminophen 5-325 MG tablet  Commonly known as:  NORCO/VICODIN  Take 1 tablet by mouth every 6 (six) hours as needed. For pain     losartan 100 MG tablet  Commonly known as:  COZAAR  Take 0.5 tablets (50 mg total) by mouth daily.     metoprolol 50 MG tablet  Commonly known as:  LOPRESSOR  TAKE 1 TABLET TWICE DAILY     nitroGLYCERIN 0.4 MG SL tablet  Commonly known as:  NITROSTAT  Place 0.4 mg under the tongue every 5 (five) minutes as needed. For chest pain     pioglitazone 30 MG tablet  Commonly known as:  ACTOS  Take 1 tablet (30 mg total) by mouth daily.     potassium chloride SA 20 MEQ tablet  Commonly known as:  K-DUR,KLOR-CON  Take 2 tablets (40 mEq total) by mouth daily.     pravastatin 40 MG tablet  Commonly known as:  PRAVACHOL  TAKE 1 TABLET DAILY.     promethazine 12.5 MG tablet  Commonly known as:  PHENERGAN  Take 1 tablet by mouth as needed. As needed for nausea     silver sulfADIAZINE 1 % cream  Commonly known as:  SILVADENE  Apply 1 application topically daily.     triamcinolone cream 0.5 %  Commonly known as:  KENALOG  Apply 1 application topically 2 (two) times daily as needed (rash).     vitamin B-12 1000 MCG tablet  Commonly known as:  CYANOCOBALAMIN  Take 1,000 mcg by mouth daily.        Major procedures and Radiology Reports - PLEASE review detailed and final reports for all details, in brief -       Dg Chest Port 1 View  12/26/2015  CLINICAL DATA:  Hypertension and hypoglycemia 1 day. Foot surgery 1 week ago. EXAM: PORTABLE CHEST 1 VIEW COMPARISON:   07/25/2014 FINDINGS: Lungs are adequately inflated without consolidation or effusion. Mild stable cardiomegaly. Calcified plaque over the aortic arch. Prominent overlying soft tissues. Degenerative change of the spine. IMPRESSION: Hypoinflation without acute cardiopulmonary disease. Mild stable cardiomegaly. Electronically Signed   By: Marin Olp M.D.   On: 12/26/2015 14:16    Micro Results      Recent Results (from the past 240 hour(s))  Culture, blood (routine x 2)     Status: None (Preliminary result)   Collection Time: 12/26/15 11:53 AM  Result Value Ref Range Status   Specimen Description BLOOD LEFT ANTECUBITAL  Final   Special Requests BOTTLES DRAWN AEROBIC AND ANAEROBIC 5CC  Final   Culture NO GROWTH 2 DAYS  Final   Report  Status PENDING  Incomplete  Culture, blood (routine x 2)     Status: None (Preliminary result)   Collection Time: 12/26/15 12:34 PM  Result Value Ref Range Status   Specimen Description BLOOD LEFT HAND  Final   Special Requests IN PEDIATRIC BOTTLE 2CCS  Final   Culture NO GROWTH 2 DAYS  Final   Report Status PENDING  Incomplete  Urine culture     Status: None   Collection Time: 12/26/15  2:22 PM  Result Value Ref Range Status   Specimen Description URINE, RANDOM  Final   Special Requests NONE  Final   Culture 3,000 COLONIES/mL INSIGNIFICANT GROWTH  Final   Report Status 12/28/2015 FINAL  Final  MRSA PCR Screening     Status: None   Collection Time: 12/26/15  6:27 PM  Result Value Ref Range Status   MRSA by PCR NEGATIVE NEGATIVE Final    Comment:        The GeneXpert MRSA Assay (FDA approved for NASAL specimens only), is one component of a comprehensive MRSA colonization surveillance program. It is not intended to diagnose MRSA infection nor to guide or monitor treatment for MRSA infections.     Today   Subjective    Charles [name] today has no headache,no chest abdominal pain,no new weakness tingling or numbness, feels much better wants to  go home today.     Objective   Blood pressure 131/61, pulse 75, temperature 98.6 F (37 C), temperature source Oral, resp. rate 19, height 6\' 4"  (1.93 m), weight 163.2 kg (359 lb 12.7 oz), SpO2 92 %.   Intake/Output Summary (Last 24 hours) at 12/29/15 0838 Last data filed at 12/29/15 0600  Gross per 24 hour  Intake   2870 ml  Output   1226 ml  Net   1644 ml    Exam Awake Alert, Oriented x 3, No new F.N deficits, Normal affect [place].AT,PERRAL Supple Neck,No JVD, No cervical lymphadenopathy appriciated.  Symmetrical Chest wall movement, Good air movement bilaterally, CTAB RRR,No Gallops,Rubs or new Murmurs, No Parasternal Heave +ve B.Sounds, Abd Soft, Non tender, No organomegaly appriciated, No rebound -guarding or rigidity. No Cyanosis, Clubbing or edema, No new Rash or bruise Patient's right foot has had recent Amputation of second and third toe, under bandage, no surrounding cellulitis   Data Review   CBC w Diff: Lab Results  Component Value Date   WBC 6.3 12/28/2015   HGB 10.3* 12/28/2015   HCT 32.4* 12/28/2015   PLT 208 12/28/2015   LYMPHOPCT 5 12/26/2015   MONOPCT 8 12/26/2015   EOSPCT 1 12/26/2015   BASOPCT 0 12/26/2015    CMP: Lab Results  Component Value Date   NA 139 12/29/2015   K 4.5 12/29/2015   CL 107 12/29/2015   CO2 28 12/29/2015   BUN 6 12/29/2015   CREATININE 0.74 12/29/2015   PROT 5.8* 12/28/2015   ALBUMIN 2.8* 12/28/2015   BILITOT 0.8 12/28/2015   ALKPHOS 45 12/28/2015   AST 19 12/28/2015   ALT 11* 12/28/2015   Lab Results  Component Value Date   HGBA1C 5.8 10/21/2015    Total Time in preparing paper work, data evaluation and todays exam - 35 minutes  Lala Lund K M.D on 12/29/2015 at 8:38 AM  [facility]   Office  [phone]

## 2015-12-29 NOTE — Discharge Instructions (Signed)
Follow with Primary MD [name], MD in 3 days   Get CBC, CMP, 2 view Chest X ray checked  by Primary MD next visit.    Activity: As tolerated with Full fall precautions use walker/cane & assistance as needed   Disposition Home     Diet:   Heart Healthy Low Carb.  Accuchecks 4 times/day, Once in AM empty stomach and then before each meal. Log in all results and show them to your Prim.MD in 3 days. If any glucose reading is under 80 or above 300 call your Prim MD immidiately. Follow Low glucose instructions for glucose under 80 as instructed.   For Heart failure patients - Check your Weight same time everyday, if you gain over 2 pounds, or you develop in leg swelling, experience more shortness of breath or chest pain, call your Primary MD immediately. Follow Cardiac Low Salt Diet and 1.5 lit/day fluid restriction.   On your next visit with your primary care physician please Get Medicines reviewed and adjusted.   Please request your Prim.MD to go over all Hospital Tests and Procedure/Radiological results at the follow up, please get all Hospital records sent to your Prim MD by signing hospital release before you go home.   If you experience worsening of your admission symptoms, develop shortness of breath, life threatening emergency, suicidal or homicidal thoughts you must seek medical attention immediately by calling 911 or calling your MD immediately  if symptoms less severe.  You Must read complete instructions/literature along with all the possible adverse reactions/side effects for all the Medicines you take and that have been prescribed to you. Take any new Medicines after you have completely understood and accpet all the possible adverse reactions/side effects.   Do not drive, operating heavy machinery, perform activities at heights, swimming or participation in water activities or provide baby sitting services if your were admitted for syncope or siezures until you have  seen by Primary MD or a Neurologist and advised to do so again.  Do not drive when taking Pain medications.    Do not take more than prescribed Pain, Sleep and Anxiety Medications  Special Instructions: If you have smoked or chewed Tobacco  in the last 2 yrs please stop smoking, stop any regular Alcohol  and or any Recreational drug use.  Wear Seat belts while driving.   Please note  You were cared for by a hospitalist during your hospital stay. If you have any questions about your discharge medications or the care you received while you were in the hospital after you are discharged, you can call the unit and asked to speak with the hospitalist on call if the hospitalist that took care of you is not available. Once you are discharged, your primary care physician will handle any further medical issues. Please note that NO REFILLS for any discharge medications will be authorized once you are discharged, as it is imperative that you return to your primary care physician (or establish a relationship with a primary care physician if you do not have one) for your aftercare needs so that they can reassess your need for medications and monitor your lab values.

## 2015-12-29 NOTE — Progress Notes (Signed)
Prev case manager had spoke w wife who will do wound card. They would like [facility] in [place] for hhpt. Ref to mary w [facility] for hhpt.pt has been dc home.

## 2015-12-29 NOTE — Progress Notes (Signed)
Patient woke up confused asking "what is going on out there?"  To another nurse.  The patient was told we had a new admission we trying to settle them in.  He called his wife and text messaged on his cell phone "its a conspiracy and people were out to get him per his family."  The patient was easily reoriented and talked to wife on the phone and daughter in person who is in the room.  The patient hasn't had much sleep in the hospital and probably has some signs of delirium at this time.  Patient states "I just want to go home."

## 2015-12-29 NOTE — Care Management Important Message (Signed)
Important Message  Patient Details  Name: Charles [name] MRN: [id number] Date of Birth: [birth year]   Medicare Important Message Given:  Yes    [name] 12/29/2015, 12:53 PM

## 2015-12-29 NOTE — Evaluation (Signed)
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 Earleen Newport on 12/24/2015, sent from podiatry office for lethargy, was noticed to have hypoglycemia, hypotension, CABG 56 initially, blood pressure 75 /53.   Clinical Impression  Patient is irritated and upset about therapist not letting pt walk to the car to be discharged or take out his IV. Presents with weakness, balance deficits and poor safety awareness impacting safe mobility. Pt with LOB x2 in standing requiring Mod A to prevent fall. Expressed safety concerns to wife and the importance of having hands on assist at home initially at all times because of confusion (which is not baseline per wife) and pt being a high fall risk. Pt is difficult to redirect at times. Would benefit from HHPT to improve strength and mobility and perform safety evaluation in the home. Recommend use of RW at home and hands on assist when up ambulating. Might not hurt to consider keeping pt one more night or til later in day until mentation/confusion improves?    Follow Up Recommendations Home health PT;Supervision/Assistance - 24 hour    Equipment Recommendations  None recommended by PT    Recommendations for Other Services OT consult     Precautions / Restrictions Precautions Precautions: Fall Required Braces or Orthoses: Other Brace/Splint Other Brace/Splint: darco shoe Restrictions Weight Bearing Restrictions: No      Mobility  Bed Mobility               General bed mobility comments: Sitting in chair upon PT arrival.   Transfers Overall transfer level: Needs assistance Equipment used: None Transfers: Sit to/from Stand Sit to Stand: Min assist         General transfer comment: Min A  to boost from chair with increased difficulty due to low surface. Assist to steady in standing.  Ambulation/Gait Ambulation/Gait assistance: Mod assist Ambulation Distance (Feet): 10 Feet Assistive device: Rolling walker (2 wheeled) Gait Pattern/deviations: Step-through pattern;Decreased stride length;Trunk flexed Gait velocity: decreased Gait velocity interpretation: <1.8 ft/sec, indicative of risk for recurrent falls General Gait Details: Difficult to redirect, impulsive and unsteady using RW.  "I could shove this down his throat," (*referring to RW and MD). POor safety awareness. LOB x2 in standing requiring Mod A to prevent fall backwards. Finally able to get him into bathroom.  Stairs            Wheelchair Mobility    Modified Rankin (Stroke Patients Only)       Balance Overall balance assessment: Needs assistance;History of Falls Sitting-balance support: Feet supported;No upper extremity supported Sitting balance-Leahy Scale: Fair     Standing balance support: During functional activity Standing balance-Leahy Scale: Poor Standing balance comment: Reliant on UE support and external support for balance. LOB x2. No safety awareness. Distracted by being upset- wanting to go home. "I am not leaving this toilet until the doctor comes in here."                             Pertinent Vitals/Pain Pain Assessment: No/denies pain    Home Living Family/patient expects to be discharged to:: Private residence Living Arrangements: Spouse/significant other;Children;Other relatives Available Help at Discharge: Family;Available 24 hours/day Type of Home: [place]  Access: Stairs to enter Entrance Stairs-Rails: None Entrance Stairs-Number of Steps: 2 Home Layout: Two level;Able to live on main level with bedroom/bathroom (son lives upstairs) Home Equipment: Kasandra Knudsen - single point;Grab bars - toilet;Walker - standard;Shower seat      Prior Function Level of Independence:  Needs assistance   Gait / Transfers Assistance Needed: INdependent for ambulation.   ADL's / Homemaking Assistance Needed: wife assists with ADL's- getting into/out of shower        Hand Dominance   Dominant Hand: Right    Extremity/Trunk Assessment   Upper Extremity Assessment: Defer to OT evaluation           Lower Extremity Assessment: Generalized weakness         Communication   Communication: HOH  Cognition Arousal/Alertness: Awake/alert Behavior During Therapy: Agitated;Impulsive (perseverating on being d/ced and going home. More irritated with each passing minute.) Overall Cognitive Status: Impaired/Different from baseline Area of Impairment: Following commands;Safety/judgement       Following Commands: Follows multi-step commands inconsistently Safety/Judgement: Decreased awareness of safety;Decreased awareness of deficits     General Comments: Pt difficult to redirect at times. Trying to leave and go to his car. Irritated and upset. Stating not pleasant things. Wife reports this is not baseline cognition.    General Comments General comments (skin integrity, edema, etc.): Lengthy discussion with wife about disposition. Wife reports she is atired Therapist, sports and has a son that can provide hands on assist at home due to safety concerns and poor safety awareness/balance. DIscussed car transfer and stair negotiation technique.    Exercises        Assessment/Plan    PT Assessment Patient needs continued PT services  PT Diagnosis Difficulty walking;Generalized weakness;Altered mental status   PT Problem List Decreased strength;Decreased cognition;Decreased activity tolerance;Decreased balance;Decreased mobility;Decreased safety awareness;Decreased knowledge of use of DME;Obesity  PT Treatment Interventions Balance training;Gait training;Stair training;Functional mobility training;Therapeutic activities;Therapeutic exercise;Patient/family education;DME instruction    PT Goals (Current goals can be found in the Care Plan section) Acute Rehab PT Goals Patient Stated Goal: to go home NOW PT Goal Formulation: With patient Time For Goal Achievement: 01/12/16 Potential to Achieve Goals: Good    Frequency Min 3X/week   Barriers to discharge Inaccessible home environment steps to climb to get into home    Co-evaluation               End of Session Equipment Utilized During Treatment: Other (comment) (darco shoe) Activity Tolerance: Treatment limited secondary to agitation Patient left: Other (comment);with family/visitor present (sitting in toilet with wife present in room. RN made aware. Chair setup with alarm pad.) Nurse Communication: Mobility status;Precautions         Time: [id number] PT Time Calculation (min) (ACUTE ONLY): 28 min   Charges:   PT Evaluation $PT Eval Moderate Complexity: 1 Procedure PT Treatments $Therapeutic Activity: 8-22 mins   PT G Codes:        Elisa Sorlie A Esperansa Sarabia 12/29/2015, 10:40 AM  Wray Kearns, PT, DPT [phone]

## 2015-12-30 ENCOUNTER — Ambulatory Visit (INDEPENDENT_AMBULATORY_CARE_PROVIDER_SITE_OTHER): Payer: Medicare Other

## 2015-12-30 ENCOUNTER — Ambulatory Visit (INDEPENDENT_AMBULATORY_CARE_PROVIDER_SITE_OTHER): Payer: Medicare Other | Admitting: Podiatry

## 2015-12-30 ENCOUNTER — Telehealth: Payer: Self-pay

## 2015-12-30 ENCOUNTER — Encounter: Payer: Self-pay | Admitting: Podiatry

## 2015-12-30 VITALS — BP 137/67 | HR 62 | Temp 96.8°F | Resp 16

## 2015-12-30 DIAGNOSIS — M86179 Other acute osteomyelitis, unspecified ankle and foot: Secondary | ICD-10-CM | POA: Diagnosis not present

## 2015-12-30 DIAGNOSIS — Z9889 Other specified postprocedural states: Secondary | ICD-10-CM | POA: Diagnosis not present

## 2015-12-30 DIAGNOSIS — Z899 Acquired absence of limb, unspecified: Secondary | ICD-10-CM

## 2015-12-30 NOTE — Telephone Encounter (Signed)
DX: hypotension. Toe amputation  Transition Care Management Follow-up Telephone Call  Date discharged? 12/29/2015  How have you been since you were released from the hospital? Doing well according to wife. Pt has an appt to follow up with podiatry after toe amputation.   Do you understand why you were in the hospital? yes  Do you understand the discharge instructions? yes  Where were you discharged to? Home  Items Reviewed:  Medications reviewed: yes  Allergies reviewed: yes  Dietary changes reviewed: yes  Referrals reviewed: yes  Functional Questionnaire:   Activities of Daily Living (ADLs):   States they are independent in the following: eating, bathing with assistance, most daily activities that do not require walking.  States they require assistance with the following: needs assistance with walking.   Any transportation issues/concerns?: Spouse is providing transportation.   Any patient concerns? Not at this time  Confirmed importance and date/time of follow-up visits scheduled Yes  Provider Appointment booked with  Confirmed with patient if condition begins to worsen call PCP or go to the ER: Yes Patient was given the office number and encouraged to call back with question or concerns: Appt has been made with PCP for Wednesday at 1:30pm. Spouse stated understanding of the importance of visit.

## 2015-12-31 ENCOUNTER — Ambulatory Visit (INDEPENDENT_AMBULATORY_CARE_PROVIDER_SITE_OTHER): Payer: Medicare Other | Admitting: Internal Medicine

## 2015-12-31 ENCOUNTER — Encounter: Payer: Self-pay | Admitting: Podiatry

## 2015-12-31 ENCOUNTER — Encounter: Payer: Self-pay | Admitting: Internal Medicine

## 2015-12-31 VITALS — BP 150/70 | HR 69 | Wt 366.0 lb

## 2015-12-31 DIAGNOSIS — E113399 Type 2 diabetes mellitus with moderate nonproliferative diabetic retinopathy without macular edema, unspecified eye: Secondary | ICD-10-CM

## 2015-12-31 DIAGNOSIS — Z794 Long term (current) use of insulin: Secondary | ICD-10-CM

## 2015-12-31 DIAGNOSIS — E860 Dehydration: Secondary | ICD-10-CM

## 2015-12-31 DIAGNOSIS — I10 Essential (primary) hypertension: Secondary | ICD-10-CM

## 2015-12-31 DIAGNOSIS — E538 Deficiency of other specified B group vitamins: Secondary | ICD-10-CM | POA: Diagnosis not present

## 2015-12-31 DIAGNOSIS — IMO0002 Reserved for concepts with insufficient information to code with codable children: Secondary | ICD-10-CM

## 2015-12-31 DIAGNOSIS — I96 Gangrene, not elsewhere classified: Secondary | ICD-10-CM

## 2015-12-31 DIAGNOSIS — E1165 Type 2 diabetes mellitus with hyperglycemia: Secondary | ICD-10-CM

## 2015-12-31 LAB — CULTURE, BLOOD (ROUTINE X 2)
CULTURE: NO GROWTH
CULTURE: NO GROWTH

## 2015-12-31 MED ORDER — LOSARTAN POTASSIUM 100 MG PO TABS: 100.0000 mg | ORAL_TABLET | Freq: Every day | ORAL | Status: DC

## 2015-12-31 NOTE — Progress Notes (Signed)
Patient ID: Charles [name], male   DOB: December 03, 1941, 75 y.o.   MRN: [id number]  Subjective: Charles [name] is a 75 y.o. 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 He has not had any pain. He was admitted to the hospital on Friday due to hypotension as well as hypoglycemia. He was discharged in the hospital yesterday. The drain was removed while in the hospital. He has restarted antibiotics. Denies any systemic complaints such as fevers, chills, nausea, vomiting. No calf pain, chest pain, shortness of breath.   Objective: General: No acute distress, AAOx3  DP/PT pulses palpable 2/4, CRT < 3 sec to all digits.  Protective sensation intact. Motor function intact.  Right foot: Incision is well coapted without any evidence of dehiscence and sutures intact. There is faint erythema just along the MPJs of the second third however it does appear to be decreased compared to the day of surgery. No ascending cellulitis. There is no fluctuance, crepitus, malodor, drainage/purulence. There is trace edema around the surgical site. There is no pain along the surgical site.  No other areas of tenderness to bilateral lower extremities.  No other open lesions or pre-ulcerative lesions.  No pain with calf compression, swelling, warmth, erythema.   Assessment and Plan:  Status post right 2nd and 3rd toe ampuations, doing well with no complications   -Treatment options discussed including all alternatives, risks, and complications -Ice/elevation -Pain medication as needed. -Continue surgical shoe. -Continue antibiotics. Clinda/Cipro -Monitor for any clinical signs or symptoms of infection and DVT/PE and directed to call the office immediately should any occur or go to the ER. -Follow-up in 1 week or sooner if any problems arise. In the meantime, encouraged to call the office with any questions, concerns, change in symptoms.   Cultures: Pathology 3rd toe- osteomyelitis;  margin viable 3rd toe margin- no OM 2nd toe- no acute OM; inflammation, ulcer, necrosis 2nd toe margin- No OM  Microbiology 2nd toe margin- NGTD 3rd toe margin- pending  Celesta Gentile, DPM

## 2015-12-31 NOTE — Assessment & Plan Note (Signed)
On B12 

## 2015-12-31 NOTE — Assessment & Plan Note (Signed)
Chronic  Glimepiride, Humalog KwikPen SS CBGs ok at home

## 2015-12-31 NOTE — Progress Notes (Signed)
Pre visit review using our clinic review tool, if applicable. No additional management support is needed unless otherwise documented below in the visit note. 

## 2015-12-31 NOTE — Progress Notes (Signed)
Subjective:  Patient ID: Charles [name], male    DOB: Apr 02, 1941  Age: 76 y.o. MRN: [id number]  CC: No chief complaint on file.   HPI Charles [name] presents for post-hosp f/u. Pt had a low BP condition (chart reviewed):  " Admission date: 12/26/2015 Admitting Physician Charles Patricia, MD  Discharge Date: 12/29/2015   Primary MD Charles Kehr, MD  Recommendations for primary care physician for things to follow:   Monitor glycemic control and adjust medications as needed.  Repeat CBC, BMP in 3-5 days  Must follow with his podiatrist/surgeon within a week   Admission Diagnosis Hypotension, unspecified [I95.9]   Discharge Diagnosis Hypotension, unspecified [I95.9]   Active Problems:  Hypotension  Hypoglycemia  Gangrene of toe ([name])  Dehydration  Acute renal failure ([name])  Acute renal failure (ARF) ([name])  Pressure ulcer    Past Medical History  Diagnosis Date  . CAD (coronary artery disease)     a. Approx. 2000 - MI. Cath showed single vessel disease, PTCA dLAD/medical management. ; b. NSTEMI 11/13 => LHC: prox and mid LAD 30%, dLAD 60%, pCFX 30%, inf branch of OM 40%, mRCA 50-60%, EF 55-60%; IVUS attempted for RCA but not successful; anatomy felt stable from 2000 => med Rx.  . Diverticulitis   . Hypertension   . Morbid obesity ([place])   . Cholelithiasis   . Hypercholesterolemia   . ED (erectile dysfunction)   . OSA on CPAP   . Type II diabetes mellitus ([place])   . History of stomach ulcers   . History of MRSA infection     "little toe right foot" (10/26/2012)  . Tunnel vision     "both eyes since stroke"  . Claustrophobia   . B12 deficiency   . Diabetic neuropathy ([place])   . PAF (paroxysmal atrial fibrillation) ([name])     a. confirmed by event monitor. b. 02/2014 rash on Coumadin, patient decided to discontinue Xarelto due to possible rash, cost and lawyers ads on TV, agreed to  take Plavix.  . Cellulitis 04/2015    rt lower extremity  . CVA (cerebral vascular accident) ([place]) ~ 2002    "tunnel vision; no peripheral vision; lost sense of direction; stability problems since" (12/26/2015)    Past Surgical History  Procedure Laterality Date  . Toe amputation  2006; 2009    "Dr. Blenda Mounts; big toe left foot; little toe on right foot" (10/26/2012)  . Cardiac catheterization  1990's  . Cerebral angiogram  ~ 2000  . Left heart catheterization with coronary angiogram N/A 10/27/2012    Procedure: LEFT HEART CATHETERIZATION WITH CORONARY ANGIOGRAM; Surgeon: Burnell Blanks, MD; Location: [facility] CATH LAB; Service: Cardiovascular; Laterality: N/A;  . Toe amputation Right 12/24/2015    2nd & 3 toes/notes 1/13//2017      HPI from the history and physical done on the day of admission:   Charles [name] is a 75 y.o. male with history of Diabetes Mellitus Type 2, Hypertension, CAD, Hypercholesteremia, neuropathy, Mrsa to right foot. He presents to the Emergency department today after being seen in Dr. Mellody [name] office for a recheck. Pt reports he had surgery to remove 2nd and 3rd toes on 1/11. Pt was found to be hypotensive in Dr. Mellody [name] office. Pt was unsteady on his feet and weak per his wife. Pt required help walking. Pt was sleepy and reported increased tiredness. Pt has experienced similar feeling when his glucose is low. He reports he was well yesterday. Pt took his insulin  before bed last pm. Pt is currently on antibiotics. Pt denies fever, no chills. Appetite has been good but he did not eat this am.     Hospital Course:    Dehydration with hypotension and acute renal failure. Resolved after IV fluids, have cut back ARB and diuretic dose upon discharge, monitor BMP, blood pressure and fluid status closely.  - Hypoglycemia : Caused by oral hypoglycemics in the setting of renal failure, hypoglycemia  now resolved, A1c 6.8, resumed oral hypoglycemic less than home dose, requested to check CBGs before every meal C at bedtime maintain a logbook ensure to PCP in 3 days.  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 Continue present antibiotics will discharge on oral antibiotics for 5 more days with close outpatient follow-up with PCP and his surgeon.  - Positive urinalysis: No symptoms or signs of UTI, culture negative.  - History of coronary artery disease; he denies any chest pain or shortness of breath, continue with aspirin and Plavix.  - History of paroxysmal atrial fibrillation: By reviewing previous records in the past, patient is not anticoagulated due to a personal choice at as per his PCP documentation and previous admissions. Resume home dose beta blocker and monitor."           Outpatient Prescriptions Prior to Visit  Medication Sig Dispense Refill  . acetaminophen (TYLENOL) 325 MG tablet Take 2 tablets (650 mg total) by mouth every 6 (six) hours as needed for mild pain, fever or headache (or Fever >/= 101).    Marland Kitchen aspirin 81 MG tablet Take 81 mg by mouth daily.    . Cholecalciferol (EQL VITAMIN D3) 1000 UNITS tablet Take 1,000 Units by mouth daily.      . clopidogrel (PLAVIX) 75 MG tablet Take 1 tablet (75 mg total) by mouth daily. 90 tablet 3  . furosemide (LASIX) 80 MG tablet Take 0.5 tablets (40 mg total) by mouth daily. 30 tablet 0  . gabapentin (NEURONTIN) 300 MG capsule TAKE 1 CAPSULE THREE TIMES DAILY AS NEEDED FOR NERVE PAIN 270 capsule 1  . HYDROcodone-acetaminophen (NORCO/VICODIN) 5-325 MG tablet Take 1 tablet by mouth every 6 (six) hours as needed. For pain    . insulin lispro (HUMALOG KWIKPEN) 100 UNIT/ML KiwkPen Inject 5-20 Units into the skin 4 (four) times daily - after meals and at bedtime. Per sliding scale 100-120 per cbg    .  metoprolol (LOPRESSOR) 50 MG tablet TAKE 1 TABLET TWICE DAILY 180 tablet 3  . nitroGLYCERIN (NITROSTAT) 0.4 MG SL tablet Place 0.4 mg under the tongue every 5 (five) minutes as needed. For chest pain    . potassium chloride Charles (K-DUR,KLOR-CON) 20 MEQ tablet Take 2 tablets (40 mEq total) by mouth daily. 90 tablet 3  . pravastatin (PRAVACHOL) 40 MG tablet TAKE 1 TABLET DAILY. 90 tablet 3  . silver sulfADIAZINE (SILVADENE) 1 % cream Apply 1 application topically daily. 50 g 2  . triamcinolone cream (KENALOG) 0.5 % Apply 1 application topically 2 (two) times daily as needed (rash). 30 g 3  . vitamin B-12 (CYANOCOBALAMIN) 1000 MCG tablet Take 1,000 mcg by mouth daily.      Marland Kitchen doxycycline (VIBRAMYCIN) 100 MG capsule Take 1 capsule (100 mg total) by mouth 2 (two) times daily. 10 capsule 0  . glimepiride (AMARYL) 2 MG tablet Take 1 tablet (2 mg total) by mouth 2 (two)  times daily before a meal. 60 tablet 0  . losartan (COZAAR) 100 MG tablet Take 0.5 tablets (50 mg total) by mouth daily. (Patient taking differently: Take 100 mg by mouth daily. ) 30 tablet 0  . pioglitazone (ACTOS) 30 MG tablet Take 1 tablet (30 mg total) by mouth daily. 30 tablet 0  . promethazine (PHENERGAN) 12.5 MG tablet Take 1 tablet by mouth as needed. As needed for nausea    . amLODipine (NORVASC) 10 MG tablet Take 1 tablet (10 mg total) by mouth daily. (Patient not taking: Reported on 12/31/2015) 30 tablet 0  . cephALEXin (KEFLEX) 500 MG capsule Take 1 capsule (500 mg total) by mouth 4 (four) times daily. (Patient not taking: Reported on 12/31/2015) 20 capsule 0   No facility-administered medications prior to visit.   F/u DM, HTN, CAD f/u ROS Review of Systems  Constitutional: Negative for fever, appetite change, fatigue and unexpected weight change.  HENT: Negative for congestion, nosebleeds, sneezing, sore throat and trouble swallowing.   Eyes: Negative for itching and visual disturbance.  Respiratory: Negative for cough.     Cardiovascular: Negative for chest pain, palpitations and leg swelling.  Gastrointestinal: Negative for nausea, diarrhea, blood in stool and abdominal distention.  Genitourinary: Negative for frequency and hematuria.  Musculoskeletal: Positive for gait problem. Negative for back pain, joint swelling and neck pain.  Skin: Negative for rash.  Neurological: Positive for weakness. Negative for dizziness, tremors and speech difficulty.  Psychiatric/Behavioral: Negative for suicidal ideas, sleep disturbance, dysphoric mood and agitation. The patient is not nervous/anxious.     Objective:  BP 150/70 mmHg  Pulse 69  Wt 366 lb (166.017 kg)  SpO2 96%  BP Readings from Last 3 Encounters:  12/31/15 150/70  12/30/15 137/67  12/29/15 131/61    Wt Readings from Last 3 Encounters:  12/31/15 366 lb (166.017 kg)  12/26/15 359 lb 12.7 oz (163.2 kg)  10/21/15 351 lb (159.213 kg)    Physical Exam  Constitutional: He is oriented to person, place, and time. He appears well-developed. No distress.  NAD  HENT:  Mouth/Throat: Oropharynx is clear and moist.  Eyes: Conjunctivae are normal. Pupils are equal, round, and reactive to light.  Neck: Normal range of motion. No JVD present. No thyromegaly present.  Cardiovascular: Normal rate, regular rhythm, normal heart sounds and intact distal pulses.  Exam reveals no gallop and no friction rub.   No murmur heard. Pulmonary/Chest: Effort normal and breath sounds normal. No respiratory distress. He has no wheezes. He has no rales. He exhibits no tenderness.  Abdominal: Soft. Bowel sounds are normal. He exhibits no distension and no mass. There is no tenderness. There is no rebound and no guarding.  Musculoskeletal: Normal range of motion. He exhibits edema and tenderness.  Lymphadenopathy:    He has no cervical adenopathy.  Neurological: He is alert and oriented to person, place, and time. He has normal reflexes. No cranial nerve deficit. He exhibits normal  muscle tone. He displays a negative Romberg sign. Coordination and gait normal.  Skin: Skin is warm and dry. No rash noted. No erythema.  Psychiatric: He has a normal mood and affect. His behavior is normal. Judgment and thought content normal.  R foot wound is dressed  Lab Results  Component Value Date   WBC 6.3 12/28/2015   HGB 10.3* 12/28/2015   HCT 32.4* 12/28/2015   PLT 208 12/28/2015   GLUCOSE 160* 12/29/2015   CHOL 122 09/12/2014   TRIG 53.0 09/12/2014  HDL 40.90 09/12/2014   LDLCALC 71 09/12/2014   ALT 11* 12/28/2015   AST 19 12/28/2015   NA 139 12/29/2015   K 4.5 12/29/2015   CL 107 12/29/2015   CREATININE 0.74 12/29/2015   BUN 6 12/29/2015   CO2 28 12/29/2015   TSH 1.19 04/18/2015   PSA 0.96 08/28/2010   INR 1.35 08/03/2014   HGBA1C 5.8 10/21/2015   MICROALBUR 1.4 08/28/2010    Dg Chest Port 1 View  12/26/2015  CLINICAL DATA:  Hypertension and hypoglycemia 1 day. Foot surgery 1 week ago. EXAM: PORTABLE CHEST 1 VIEW COMPARISON:  07/25/2014 FINDINGS: Lungs are adequately inflated without consolidation or effusion. Mild stable cardiomegaly. Calcified plaque over the aortic arch. Prominent overlying soft tissues. Degenerative change of the spine. IMPRESSION: Hypoinflation without acute cardiopulmonary disease. Mild stable cardiomegaly. Electronically Signed   By: Marin Olp M.D.   On: 12/26/2015 14:16    Assessment & Plan:   Diagnoses and all orders for this visit:  Dehydration -     Basic metabolic panel; Future -     CBC with Differential/Platelet; Future -     Hemoglobin A1c; Future -     Vitamin B12; Future  Gangrene of toe ([name]) -     Basic metabolic panel; Future -     CBC with Differential/Platelet; Future -     Hemoglobin A1c; Future -     Vitamin B12; Future  Essential hypertension -     Basic metabolic panel; Future -     CBC with Differential/Platelet; Future -     Hemoglobin A1c; Future -     Vitamin B12; Future  B12 deficiency -      Basic metabolic panel; Future -     CBC with Differential/Platelet; Future -     Hemoglobin A1c; Future -     Vitamin B12; Future  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  I have discontinued Mr. Varano's promethazine, amLODipine, doxycycline, and cephALEXin. I have also changed his losartan. Additionally, I am having him maintain his vitamin B-12, Cholecalciferol, nitroGLYCERIN, insulin lispro, acetaminophen, silver sulfADIAZINE, clopidogrel, potassium chloride Charles, triamcinolone cream, aspirin, gabapentin, metoprolol, pravastatin, HYDROcodone-acetaminophen, furosemide, glimepiride, pioglitazone, clindamycin, and ciprofloxacin.  Meds ordered this encounter  Medications  . glimepiride (AMARYL) 4 MG tablet    Sig: Take 1 tablet by mouth 2 (two) times daily.  . pioglitazone (ACTOS) 45 MG tablet    Sig: Take 1 tablet by mouth daily.  . clindamycin (CLEOCIN) 300 MG capsule    Sig:   . ciprofloxacin (CIPRO) 500 MG tablet    Sig:   . losartan (COZAAR) 100 MG tablet    Sig: Take 1 tablet (100 mg total) by mouth daily.    Dispense:  90 tablet    Refill:  3     Follow-up: Return in about 2 months (around 02/28/2016) for a follow-up visit.  Charles Kehr, MD

## 2015-12-31 NOTE — Assessment & Plan Note (Signed)
Dr Jacqualyn Posey - s/p amputation 2017

## 2015-12-31 NOTE — Assessment & Plan Note (Signed)
On Rx: Losartan, Lopressor,  Lasix - re-start

## 2015-12-31 NOTE — Assessment & Plan Note (Signed)
1/17 Corrected - resolved

## 2016-01-02 ENCOUNTER — Other Ambulatory Visit: Payer: Self-pay

## 2016-01-03 ENCOUNTER — Other Ambulatory Visit: Payer: Self-pay | Admitting: Internal Medicine

## 2016-01-06 ENCOUNTER — Ambulatory Visit (INDEPENDENT_AMBULATORY_CARE_PROVIDER_SITE_OTHER): Payer: Medicare Other | Admitting: Podiatry

## 2016-01-06 ENCOUNTER — Encounter: Payer: Self-pay | Admitting: Podiatry

## 2016-01-06 VITALS — BP 151/70 | HR 70 | Resp 18

## 2016-01-06 DIAGNOSIS — Z9889 Other specified postprocedural states: Secondary | ICD-10-CM

## 2016-01-06 DIAGNOSIS — Z899 Acquired absence of limb, unspecified: Secondary | ICD-10-CM

## 2016-01-06 MED ORDER — CLINDAMYCIN HCL 300 MG PO CAPS: 300.0000 mg | ORAL_CAPSULE | Freq: Three times a day (TID) | ORAL | Status: DC

## 2016-01-06 MED ORDER — CIPROFLOXACIN HCL 500 MG PO TABS: 500.0000 mg | ORAL_TABLET | Freq: Two times a day (BID) | ORAL | Status: DC

## 2016-01-07 ENCOUNTER — Telehealth: Payer: Self-pay | Admitting: *Deleted

## 2016-01-07 MED ORDER — CIPROFLOXACIN HCL 500 MG PO TABS
500.0000 mg | ORAL_TABLET | Freq: Two times a day (BID) | ORAL | Status: DC
Start: 2016-01-07 — End: 2016-02-10

## 2016-01-07 MED ORDER — CLINDAMYCIN HCL 300 MG PO CAPS: 300.0000 mg | ORAL_CAPSULE | Freq: Three times a day (TID) | ORAL | Status: DC

## 2016-01-07 NOTE — Telephone Encounter (Signed)
Pt's wife, Joaquim Lai states pt's medications that were ordered yesterday were escribed to [place], not [facility] in [place].  Orders for Cipro and Clindamycin ordered on 01/06/2016 changed to [facility] as pt's wife requested.

## 2016-01-11 ENCOUNTER — Encounter: Payer: Self-pay | Admitting: Podiatry

## 2016-01-11 NOTE — Progress Notes (Signed)
Patient ID: Charles [name], male   DOB: 16-Nov-1941, 75 y.o.   MRN: [id number]  Subjective: Charles [name] is a 75 y.o. 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 He has not had any pain. He has continued on antibiotics. He is continue with the surgical shoe. Denies any systemic complaints such as fevers, chills, nausea, vomiting. No calf pain, chest pain, shortness of breath.   Objective: General: No acute distress, AAOx3  DP/PT pulses palpable 2/4, CRT < 3 sec to all digits.  Protective sensation intact. Motor function intact.  Right foot: Incision is well coapted without any evidence of dehiscence and sutures intact to the second toe. On the amputation site of the third toe there is a superficial wound within the central aspect of the incision this is from where the drain was removed. There is no probing to bone. The erythema that was present on the dorsal MPJs has decreased since last appointment. There is no area of tenderness. There are no other open lesions or pre-ulcerative lesions identified at this time. There is no pain of the surgical foot. No other areas of tenderness to bilateral lower extremities.  No other open lesions or pre-ulcerative lesions.  No pain with calf compression, swelling, warmth, erythema.   Assessment and Plan:  Status post right 2nd and 3rd toe ampuations, doing well with no complications   -Treatment options discussed including all alternatives, risks, and complications -Ice/elevation -Pain medication as needed. -Continue surgical shoe. -Continue antibiotics for now. Clinda/Cipro Will likely continue until the wounds heal.  -Monitor for any clinical signs or symptoms of infection and DVT/PE and directed to call the office immediately should any occur or go to the ER. -Follow-up in 1 week or sooner if any problems arise. In the meantime, encouraged to call the office with any questions, concerns, change in symptoms.    Cultures: Pathology 3rd toe- osteomyelitis; margin viable 3rd toe margin- no OM 2nd toe- no acute OM; inflammation, ulcer, necrosis 2nd toe margin- No OM  Microbiology 2nd toe margin- NGTD 3rd toe margin- NGTD  Celesta Gentile, DPM

## 2016-01-12 ENCOUNTER — Encounter: Payer: Self-pay | Admitting: Podiatry

## 2016-01-12 ENCOUNTER — Ambulatory Visit (INDEPENDENT_AMBULATORY_CARE_PROVIDER_SITE_OTHER): Payer: Medicare Other | Admitting: Podiatry

## 2016-01-12 VITALS — BP 122/67 | HR 64 | Resp 18

## 2016-01-12 DIAGNOSIS — Z9889 Other specified postprocedural states: Secondary | ICD-10-CM

## 2016-01-12 DIAGNOSIS — Z899 Acquired absence of limb, unspecified: Secondary | ICD-10-CM

## 2016-01-16 ENCOUNTER — Encounter: Payer: Self-pay | Admitting: Podiatry

## 2016-01-16 DIAGNOSIS — Z899 Acquired absence of limb, unspecified: Secondary | ICD-10-CM | POA: Insufficient documentation

## 2016-01-16 NOTE — Progress Notes (Signed)
Patient ID: Charles [name], male   DOB: 05/01/1941, 75 y.o.   MRN: [id number]  Subjective: Charles [name] is a 75 y.o. 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 He continues to not have any pain. He has continued on antibiotics. He is continuing with the surgical shoe. Denies any systemic complaints such as fevers, chills, nausea, vomiting. No calf pain, chest pain, shortness of breath.   Objective: General: No acute distress, AAOx3  DP/PT pulses palpable 2/4, CRT < 3 sec to all digits.  Protective sensation intact. Motor function intact.  Right foot: Incision is well coapted without any evidence of dehiscence and sutures intact to the second toe. On the amputation site of the third toe there is a superficial wound within the central aspect of the incision this is from where the drain was removed however this appears to be almost closed. There is no probing to bone. The erythema that was present on the dorsal MPJs has resolved since last appointment. There is no area of tenderness. There are no other open lesions or pre-ulcerative lesions identified at this time. There is no pain of the surgical foot. No other areas of tenderness to bilateral lower extremities.  No other open lesions or pre-ulcerative lesions.  No pain with calf compression, swelling, warmth, erythema.   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 Antibiotic on it was placed over the incisions and wound followed by dressing. Continue daily dressing changes. At this time the wound appears to be almost healed from with the drain was out. We will stop antibiotics as cultures were negative and the wound appears to be almost healed. If there is any signs or symptoms of worsening infection and call the office immediately  and restart antibiotics. -There does appear to be adequate healing. We'll hold off on any vascular surgery evaluation. -Continue a surgical shoe. -Elevation -Monitor for any clinical signs or symptoms of infection and DVT/PE and directed to call the office immediately should any occur or go to the ER. -Follow-up as scheduled or sooner if any problems arise. In the meantime, encouraged to call the office with any questions, concerns, change in symptoms.   Cultures/Pathology 12/24/15 from surgery: Pathology (from [facility]) 3rd toe- osteomyelitis; margin viable 3rd toe margin- no OM 2nd toe- no acute OM; inflammation, ulcer, necrosis 2nd toe margin- No OM   Microbiology (from [facility]) 2nd toe margin- no growth  3rd toe margin- no growth   Celesta Gentile, DPM

## 2016-01-21 ENCOUNTER — Ambulatory Visit: Payer: Medicare Other | Admitting: Internal Medicine

## 2016-01-26 ENCOUNTER — Encounter: Payer: Self-pay | Admitting: Podiatry

## 2016-01-26 ENCOUNTER — Ambulatory Visit (INDEPENDENT_AMBULATORY_CARE_PROVIDER_SITE_OTHER): Payer: Medicare Other | Admitting: Podiatry

## 2016-01-26 VITALS — BP 170/75 | HR 82 | Resp 16

## 2016-01-26 DIAGNOSIS — L97512 Non-pressure chronic ulcer of other part of right foot with fat layer exposed: Secondary | ICD-10-CM

## 2016-01-26 DIAGNOSIS — Z899 Acquired absence of limb, unspecified: Secondary | ICD-10-CM

## 2016-01-26 NOTE — Progress Notes (Signed)
Patient ID: Charles [name], male   DOB: 1941-08-15, 75 y.o.   MRN: [id number]  Subjective: Charles [name] is a 75 y.o. 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 He continues to not have any pain. He has not been on antibiotics. He is continuing with the surgical shoe. Denies any systemic complaints such as fevers, chills, nausea, vomiting. No calf pain, chest pain, shortness of breath.   Objective: General: No acute distress, AAOx3  DP/PT pulses palpable 2/4, CRT < 3 sec to all digits.  Protective sensation intact. Motor function intact.  Right foot: Incision is well coapted without any evidence of dehiscence and a scar has formed to the second toe. On the amputation site of the third toe there is a superficial wound within the central aspect of the incision this is from where the drain was removed measuring about 0.7 x 0.1 cm and is superficial. There is no probing to bone. There is no surrounding erythema or ascending cellulitis. There is no area of tenderness. There are no other open lesions or pre-ulcerative lesions identified at this time. There is no pain of the surgical foot. No other areas of tenderness to bilateral lower extremities.  No other open lesions or pre-ulcerative lesions.  No pain with calf compression, swelling, warmth, erythema.   Assessment and Plan:  Status post right 2nd and 3rd toe ampuations, doing well with no complications   -Treatment options discussed including all alternatives, risks, and complications -Was debrided to the third toe amputation site. Iodosorb was applied followed by a dressing. Continue with daily dressing changes with Silvadene. Monitor for any recurrence of erythema or any signs or symptoms of worsening infection. If there are any to call the office immediately and restart antibiotics. -Continue a surgical shoe. -Elevation -Monitor for any clinical signs or symptoms of infection and DVT/PE and directed to  call the office immediately should any occur or go to the ER. -Follow-up as scheduled or sooner if any problems arise. In the meantime, encouraged to call the office with any questions, concerns, change in symptoms.   Cultures/Pathology 12/24/15 from surgery: Pathology (from [facility]) 3rd toe- osteomyelitis; margin viable 3rd toe margin- no OM 2nd toe- no acute OM; inflammation, ulcer, necrosis 2nd toe margin- No OM   Microbiology (from [facility]) 2nd toe margin- no growth  3rd toe margin- no growth   Celesta Gentile, DPM

## 2016-01-27 ENCOUNTER — Ambulatory Visit: Payer: Medicare Other | Admitting: Podiatry

## 2016-01-30 ENCOUNTER — Encounter: Payer: Self-pay | Admitting: Internal Medicine

## 2016-01-30 ENCOUNTER — Ambulatory Visit (INDEPENDENT_AMBULATORY_CARE_PROVIDER_SITE_OTHER): Payer: Medicare Other | Admitting: Internal Medicine

## 2016-01-30 VITALS — BP 130/64 | HR 64 | Ht 76.0 in | Wt 354.0 lb

## 2016-01-30 DIAGNOSIS — R0602 Shortness of breath: Secondary | ICD-10-CM

## 2016-01-30 DIAGNOSIS — I480 Paroxysmal atrial fibrillation: Secondary | ICD-10-CM | POA: Diagnosis not present

## 2016-01-30 DIAGNOSIS — I10 Essential (primary) hypertension: Secondary | ICD-10-CM

## 2016-01-30 DIAGNOSIS — I4891 Unspecified atrial fibrillation: Secondary | ICD-10-CM | POA: Diagnosis not present

## 2016-01-30 LAB — BASIC METABOLIC PANEL WITH GFR
BUN: 11 mg/dL (ref 7–25)
CO2: 28 mmol/L (ref 20–31)
Calcium: 8.8 mg/dL (ref 8.6–10.3)
Chloride: 103 mmol/L (ref 98–110)
Creat: 0.82 mg/dL (ref 0.70–1.18)
Glucose, Bld: 172 mg/dL — ABNORMAL HIGH (ref 65–99)
Potassium: 4.4 mmol/L (ref 3.5–5.3)
Sodium: 137 mmol/L (ref 135–146)

## 2016-01-30 NOTE — Progress Notes (Signed)
Cardiology Office Note   Date:  01/30/2016   ID:  Kaori, Roehl Jul 20, 1941, MRN [id number]  PCP:  [name], MD  Cardiologist:   Dorris Carnes, MD   F/U of CAD    History of Present Illness: NASIF LEN is a 75 y.o. male with a history of  CAD (MI in 2000), HTN, CVA, [facility] atrial fib. Last cath done showed moderate CAD. Plan for medical Rx. He has been on coumadin and Xarelto in past  I saw the pt in June 2016  Since seen he had problmes with foot ulcers  Went on to have amputation of 2 toes   Pt recently had labs at [place] to back off of lasix to 40 (had been on 80 mg per day)  The pt notes occasional CP  Not associated with activity Breathign does get short at times  Has not been as active     Current Outpatient Prescriptions  Medication Sig Dispense Refill  . acetaminophen (TYLENOL) 325 MG tablet Take 2 tablets (650 mg total) by mouth every 6 (six) hours as needed for mild pain, fever or headache (or Fever >/= 101).    Marland Kitchen aspirin 81 MG tablet Take 81 mg by mouth daily.    . Cholecalciferol (EQL VITAMIN D3) 1000 UNITS tablet Take 1,000 Units by mouth daily.      . clopidogrel (PLAVIX) 75 MG tablet TAKE 1 TABLET EVERY DAY 90 tablet 3  . furosemide (LASIX) 80 MG tablet Take 0.5 tablets (40 mg total) by mouth daily. 30 tablet 0  . gabapentin (NEURONTIN) 300 MG capsule TAKE 1 CAPSULE THREE TIMES DAILY AS NEEDED FOR NERVE PAIN 270 capsule 1  . glimepiride (AMARYL) 4 MG tablet Take 1 tablet by mouth 2 (two) times daily.    Marland Kitchen HYDROcodone-acetaminophen (NORCO/VICODIN) 5-325 MG tablet Take 1 tablet by mouth every 6 (six) hours as needed. For pain    . insulin lispro (HUMALOG KWIKPEN) 100 UNIT/ML KiwkPen Inject 5-20 Units into the skin 4 (four) times daily - after meals and at bedtime. Per sliding scale 100-120 per cbg    . losartan (COZAAR) 100 MG tablet Take 1 tablet (100 mg total) by mouth daily. 90 tablet 3  . metoprolol (LOPRESSOR) 50 MG tablet TAKE 1 TABLET  TWICE DAILY 180 tablet 3  . nitroGLYCERIN (NITROSTAT) 0.4 MG SL tablet Place 0.4 mg under the tongue every 5 (five) minutes as needed. For chest pain    . pioglitazone (ACTOS) 45 MG tablet Take 1 tablet by mouth daily.    . potassium chloride SA (K-DUR,KLOR-CON) 20 MEQ tablet TAKE 2 TABLETS EVERY DAY 180 tablet 3  . pravastatin (PRAVACHOL) 40 MG tablet TAKE 1 TABLET DAILY. 90 tablet 3  . silver sulfADIAZINE (SILVADENE) 1 % cream Apply 1 application topically daily. 50 g 2  . triamcinolone cream (KENALOG) 0.5 % Apply 1 application topically 2 (two) times daily as needed (rash). 30 g 3  . vitamin B-12 (CYANOCOBALAMIN) 1000 MCG tablet Take 1,000 mcg by mouth daily.      . ciprofloxacin (CIPRO) 500 MG tablet Take 1 tablet (500 mg total) by mouth 2 (two) times daily. (Patient not taking: Reported on 01/30/2016) 20 tablet 0  . clindamycin (CLEOCIN) 300 MG capsule Take 1 capsule (300 mg total) by mouth 3 (three) times daily. (Patient not taking: Reported on 01/30/2016) 30 capsule 1   No current facility-administered medications for this visit.    Allergies:   Xarelto  Past Medical History  Diagnosis Date  . CAD (coronary artery disease)     a. Approx. 2000 - MI. Cath showed single vessel disease, PTCA dLAD/medical management. ;  b. NSTEMI 11/13 => LHC: prox and mid LAD 30%, dLAD 60%, pCFX 30%, inf branch of OM 40%, mRCA 50-60%, EF 55-60%; IVUS attempted for RCA but not successful; anatomy felt stable from 2000 => med Rx.  . Diverticulitis   . Hypertension   . Morbid obesity ([place])   . Cholelithiasis   . Hypercholesterolemia   . ED (erectile dysfunction)   . OSA on CPAP   . Type II diabetes mellitus ([place])   . History of stomach ulcers   . History of MRSA infection     "little toe right foot" (10/26/2012)  . Tunnel vision     "both eyes since stroke"  . Claustrophobia   . B12 deficiency   . Diabetic neuropathy ([place])   . PAF (paroxysmal atrial fibrillation) ([name])     a. confirmed by event  monitor. b. 02/2014 rash on Coumadin, patient decided to discontinue Xarelto due to possible rash, cost and lawyers ads on TV, agreed to take Plavix.  . Cellulitis 04/2015    rt lower extremity  . CVA (cerebral vascular accident) ([place]) ~ 2002    "tunnel vision; no peripheral vision; lost sense of direction; stability problems since" (12/26/2015)    Past Surgical History  Procedure Laterality Date  . Toe amputation  2006; 2009    "Dr. Blenda Mounts; big toe left foot; little toe on right foot" (10/26/2012)  . Cardiac catheterization  1990's  . Cerebral angiogram  ~ 2000  . Left heart catheterization with coronary angiogram N/A 10/27/2012    Procedure: LEFT HEART CATHETERIZATION WITH CORONARY ANGIOGRAM;  Surgeon: Burnell Blanks, MD;  Location: [facility] CATH LAB;  Service: Cardiovascular;  Laterality: N/A;  . Toe amputation Right 12/24/2015    2nd & 3 toes/notes 1/13//2017     Social History:  The patient  reports that he has quit smoking. His smoking use included Cigarettes and Cigars. He has a 30 pack-year smoking history. His smokeless tobacco use includes Snuff. He reports that he drinks alcohol. He reports that he does not use illicit drugs.   Family History:  The patient's family history includes Cancer (age of onset: 16) in his mother; Heart disease (age of onset: 12) in his father. There is no history of Asthma.    ROS:  Please see the history of present illness. All other systems are reviewed and  Negative to the above problem except as noted.    PHYSICAL EXAM: VS:  BP 130/64 mmHg  Pulse 64  Ht 6\' 4"  (1.93 m)  Wt 160.573 kg (354 lb)  BMI 43.11 kg/m2  GEN: Well nourished, well developed, in no acute distress HEENT: normal Neck: Unable to assess JVP due to beard   Cardiac: RRR; no murmurs, rubs, or gallops, 1+ edema legs with erythema Respiratory:  clear to auscultation bilaterally, normal work of breathing GI: soft, nontender, nondistended, + BS  No hepatomegaly  MS: no deformity  Moving all extremities  R foot in boot.   Skin: warm and dry, no rash Neuro:  Strength and sensation are intact Psych: euthymic mood, full affect   EKG:  EKG is not ordered today.   Lipid Panel    Component Value Date/Time   CHOL 122 09/12/2014 1136   TRIG 53.0 09/12/2014 1136   TRIG 73 12/22/2006 0827   HDL 40.90 09/12/2014  1136   CHOLHDL 3 09/12/2014 1136   CHOLHDL 2.7 CALC 12/22/2006 0827   VLDL 10.6 09/12/2014 1136   LDLCALC 71 09/12/2014 1136      Wt Readings from Last 3 Encounters:  01/30/16 160.573 kg (354 lb)  12/31/15 166.017 kg (366 lb)  12/26/15 163.2 kg (359 lb 12.7 oz)      ASSESSMENT AND PLAN:  1  CAD I am not conivnced of any active angina  Follow  2.  PAF  Pt appears to be in SR now.  Has refused anticoaguation  Understands risks of not taking   3  Chronic diastolic CHF Volume is up some  His lasix dose was decreased about 1 month ago  Will check labs  May need to go back to 80 1 to 2 x per wk    4.  Renal  Check BMET today     Current medicines are reviewed at length with the patient today.  The patient does not have concerns regarding medicines.  F/U in clinic in 6 months  Will base changes on test results   Signed, Dorris Carnes, MD  01/30/2016 3:41 PM    [facility] [address], [place], [place]  [address] Phone: [phone]; Fax: [phone]

## 2016-01-30 NOTE — Patient Instructions (Signed)
Your physician recommends that you continue on your current medications as directed. Please refer to the Current Medication list given to you today. Your physician recommends that you return for lab work tody (BMET, BNP)  Your physician wants you to follow-up in: [place].  You will receive a reminder letter in the mail two months in advance. If you don't receive a letter, please call our office to schedule the follow-up appointment.

## 2016-01-31 LAB — BRAIN NATRIURETIC PEPTIDE: BRAIN NATRIURETIC PEPTIDE: 190.9 pg/mL — AB (ref <100)

## 2016-02-03 ENCOUNTER — Other Ambulatory Visit: Payer: Self-pay | Admitting: *Deleted

## 2016-02-03 DIAGNOSIS — R0602 Shortness of breath: Secondary | ICD-10-CM

## 2016-02-03 DIAGNOSIS — I2510 Atherosclerotic heart disease of native coronary artery without angina pectoris: Secondary | ICD-10-CM

## 2016-02-03 MED ORDER — FUROSEMIDE 80 MG PO TABS: ORAL_TABLET | ORAL | Status: DC

## 2016-02-10 ENCOUNTER — Ambulatory Visit (INDEPENDENT_AMBULATORY_CARE_PROVIDER_SITE_OTHER): Payer: Medicare Other | Admitting: Podiatry

## 2016-02-10 ENCOUNTER — Encounter: Payer: Self-pay | Admitting: Podiatry

## 2016-02-10 ENCOUNTER — Ambulatory Visit (INDEPENDENT_AMBULATORY_CARE_PROVIDER_SITE_OTHER): Payer: Medicare Other

## 2016-02-10 VITALS — BP 109/58 | HR 58 | Resp 16

## 2016-02-10 DIAGNOSIS — I2510 Atherosclerotic heart disease of native coronary artery without angina pectoris: Secondary | ICD-10-CM

## 2016-02-10 DIAGNOSIS — L97529 Non-pressure chronic ulcer of other part of left foot with unspecified severity: Secondary | ICD-10-CM

## 2016-02-10 DIAGNOSIS — Z9889 Other specified postprocedural states: Secondary | ICD-10-CM

## 2016-02-10 DIAGNOSIS — E11621 Type 2 diabetes mellitus with foot ulcer: Secondary | ICD-10-CM

## 2016-02-10 DIAGNOSIS — M86179 Other acute osteomyelitis, unspecified ankle and foot: Secondary | ICD-10-CM | POA: Diagnosis not present

## 2016-02-10 MED ORDER — CLINDAMYCIN HCL 300 MG PO CAPS: 300.0000 mg | ORAL_CAPSULE | Freq: Three times a day (TID) | ORAL | Status: DC

## 2016-02-11 ENCOUNTER — Encounter: Payer: Self-pay | Admitting: Podiatry

## 2016-02-11 NOTE — Progress Notes (Signed)
Patient ID: YARIN BINGER, male   DOB: February 03, 1941, 75 y.o.   MRN: [id number]  Subjective: RAIYAN DORRY is a 75 y.o. 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 He continues to not have any pain. He has not been on antibiotics. He is continuing with the surgical shoe.  States that in new bone forming on the left foot. He previously had a partial first toe amputation and he is about the wound under the area since last appointment. Denies any systemic complaints such as fevers, chills, nausea, vomiting. No calf pain, chest pain, shortness of breath.   Objective: General: No acute distress, AAOx3  DP/PT pulses palpable 2/4, CRT < 3 sec to all digits.  Protective sensation intact. Motor function intact.  Right foot: Incision is well coapted without any evidence of dehiscence and a scar has formed to the second toe. On the amputation site of the third toe there is a superficial wound within the central aspect of the incision this is from where the drain was removed measuring about 0.54 x 0.1 cm and is superficial. There is no probing to bone. There is no surrounding erythema or ascending cellulitis. There is no area of tenderness. There are no other open lesions or pre-ulcerative lesions identified at this time. There is no pain of the surgical foot. Left foot: There is a previous cementation of the hallux. Submetatarsal one is a superficial, annular, small granular wound that any probing, undermining or tunneling. There is no swelling erythema, ascending synovitis, fluctuance, crepitus, malodor, drainage or pus. No other areas of tenderness to bilateral lower extremities.  No other open lesions or pre-ulcerative lesions.  No pain with calf compression, swelling, warmth, erythema.   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  -Treatment options discussed including all alternatives, risks, and complications -Right foot was abraded to granular tissue. No signs of infection. Continue daily dressing changes. On the left side the wound was debrided of hyperkeratotic periwound and was abraded to granular tissue. Again there is no signs of infection. Continue offloading. Daily dressing changes.  -Follow-up as scheduled  or sooner if any problems arise. In the meantime, encouraged to call the office with any questions, concerns, change in symptoms.   Celesta Gentile, DPM  Cultures/Pathology 12/24/15 from surgery: Pathology (from [facility]) 3rd toe- osteomyelitis; margin viable 3rd toe margin- no OM 2nd toe- no acute OM; inflammation, ulcer, necrosis 2nd toe margin- No OM   Microbiology (from [facility]) 2nd toe margin- no growth  3rd toe margin- no growth   Celesta Gentile, DPM

## 2016-03-01 ENCOUNTER — Ambulatory Visit (INDEPENDENT_AMBULATORY_CARE_PROVIDER_SITE_OTHER): Payer: Medicare Other | Admitting: Podiatry

## 2016-03-01 ENCOUNTER — Encounter: Payer: Self-pay | Admitting: Podiatry

## 2016-03-01 VITALS — BP 107/46 | HR 66 | Resp 18

## 2016-03-01 DIAGNOSIS — L89891 Pressure ulcer of other site, stage 1: Secondary | ICD-10-CM

## 2016-03-01 DIAGNOSIS — L97529 Non-pressure chronic ulcer of other part of left foot with unspecified severity: Principal | ICD-10-CM

## 2016-03-01 DIAGNOSIS — E11621 Type 2 diabetes mellitus with foot ulcer: Secondary | ICD-10-CM

## 2016-03-01 DIAGNOSIS — Z9889 Other specified postprocedural states: Secondary | ICD-10-CM

## 2016-03-02 ENCOUNTER — Encounter: Payer: Self-pay | Admitting: Podiatry

## 2016-03-02 ENCOUNTER — Encounter: Payer: Self-pay | Admitting: Internal Medicine

## 2016-03-02 ENCOUNTER — Ambulatory Visit (INDEPENDENT_AMBULATORY_CARE_PROVIDER_SITE_OTHER): Payer: Medicare Other | Admitting: Internal Medicine

## 2016-03-02 ENCOUNTER — Other Ambulatory Visit (INDEPENDENT_AMBULATORY_CARE_PROVIDER_SITE_OTHER): Payer: Medicare Other

## 2016-03-02 VITALS — BP 116/62 | HR 69 | Temp 98.0°F | Wt 356.0 lb

## 2016-03-02 DIAGNOSIS — J441 Chronic obstructive pulmonary disease with (acute) exacerbation: Secondary | ICD-10-CM

## 2016-03-02 DIAGNOSIS — IMO0002 Reserved for concepts with insufficient information to code with codable children: Secondary | ICD-10-CM

## 2016-03-02 DIAGNOSIS — I480 Paroxysmal atrial fibrillation: Secondary | ICD-10-CM | POA: Diagnosis not present

## 2016-03-02 DIAGNOSIS — E113399 Type 2 diabetes mellitus with moderate nonproliferative diabetic retinopathy without macular edema, unspecified eye: Secondary | ICD-10-CM

## 2016-03-02 DIAGNOSIS — I959 Hypotension, unspecified: Secondary | ICD-10-CM | POA: Diagnosis not present

## 2016-03-02 DIAGNOSIS — I2510 Atherosclerotic heart disease of native coronary artery without angina pectoris: Secondary | ICD-10-CM

## 2016-03-02 DIAGNOSIS — Z794 Long term (current) use of insulin: Secondary | ICD-10-CM

## 2016-03-02 DIAGNOSIS — E1165 Type 2 diabetes mellitus with hyperglycemia: Secondary | ICD-10-CM

## 2016-03-02 LAB — BASIC METABOLIC PANEL
BUN: 17 mg/dL (ref 6–23)
CHLORIDE: 104 meq/L (ref 96–112)
CO2: 32 mEq/L (ref 19–32)
CREATININE: 0.83 mg/dL (ref 0.40–1.50)
Calcium: 9.1 mg/dL (ref 8.4–10.5)
GFR: 96.12 mL/min (ref >60.00)
GLUCOSE: 96 mg/dL (ref 70–99)
Potassium: 4.6 mEq/L (ref 3.5–5.1)
Sodium: 140 mEq/L (ref 135–145)

## 2016-03-02 LAB — HEMOGLOBIN A1C: HEMOGLOBIN A1C: 6.6 % — AB (ref 4.6–6.5)

## 2016-03-02 MED ORDER — FLUTICASONE-SALMETEROL 100-50 MCG/DOSE IN AEPB: 1.0000 | INHALATION_SPRAY | Freq: Two times a day (BID) | RESPIRATORY_TRACT | Status: DC

## 2016-03-02 MED ORDER — INSULIN LISPRO 100 UNIT/ML (KWIKPEN): 5.0000 [IU] | PEN_INJECTOR | Freq: Three times a day (TID) | SUBCUTANEOUS | Status: DC

## 2016-03-02 MED ORDER — AZITHROMYCIN 250 MG PO TABS: ORAL_TABLET | ORAL | Status: DC

## 2016-03-02 MED ORDER — NITROGLYCERIN 0.4 MG SL SUBL: 0.4000 mg | SUBLINGUAL_TABLET | SUBLINGUAL | Status: DC | PRN

## 2016-03-02 NOTE — Assessment & Plan Note (Signed)
Advair bid Zpac if worse

## 2016-03-02 NOTE — Patient Instructions (Signed)
Use ADVAIR

## 2016-03-02 NOTE — Progress Notes (Signed)
Pre visit review using our clinic review tool, if applicable. No additional management support is needed unless otherwise documented below in the visit note. 

## 2016-03-02 NOTE — Assessment & Plan Note (Signed)
Lopressor, Plavix, ASA 

## 2016-03-02 NOTE — Progress Notes (Signed)
Subjective:  Patient ID: Charles [name], male    DOB: 18-Sep-1941  Age: 75 y.o. MRN: [id number]  CC: No chief complaint on file.   HPI Charles [name] presents for DM, A fib, HTN C/o URI sx's x 1 week. Pt just finished Clinda po  Outpatient Prescriptions Prior to Visit  Medication Sig Dispense Refill  . acetaminophen (TYLENOL) 325 MG tablet Take 2 tablets (650 mg total) by mouth every 6 (six) hours as needed for mild pain, fever or headache (or Fever >/= 101).    Marland Kitchen aspirin 81 MG tablet Take 81 mg by mouth daily.    . Cholecalciferol (EQL VITAMIN D3) 1000 UNITS tablet Take 1,000 Units by mouth daily.      . clopidogrel (PLAVIX) 75 MG tablet TAKE 1 TABLET EVERY DAY 90 tablet 3  . furosemide (LASIX) 80 MG tablet Take 1/2 tablet daily alternating with whole tablet daily 30 tablet 0  . gabapentin (NEURONTIN) 300 MG capsule TAKE 1 CAPSULE THREE TIMES DAILY AS NEEDED FOR NERVE PAIN 270 capsule 1  . glimepiride (AMARYL) 4 MG tablet Take 1 tablet by mouth 2 (two) times daily.    . insulin lispro (HUMALOG KWIKPEN) 100 UNIT/ML KiwkPen Inject 5-20 Units into the skin 4 (four) times daily - after meals and at bedtime. Per sliding scale 100-120 per cbg    . losartan (COZAAR) 100 MG tablet Take 1 tablet (100 mg total) by mouth daily. 90 tablet 3  . metoprolol (LOPRESSOR) 50 MG tablet TAKE 1 TABLET TWICE DAILY 180 tablet 3  . nitroGLYCERIN (NITROSTAT) 0.4 MG SL tablet Place 0.4 mg under the tongue every 5 (five) minutes as needed. For chest pain    . pioglitazone (ACTOS) 45 MG tablet Take 1 tablet by mouth daily.    . potassium chloride SA (K-DUR,KLOR-CON) 20 MEQ tablet TAKE 2 TABLETS EVERY DAY 180 tablet 3  . pravastatin (PRAVACHOL) 40 MG tablet TAKE 1 TABLET DAILY. 90 tablet 3  . silver sulfADIAZINE (SILVADENE) 1 % cream Apply 1 application topically daily. 50 g 2  . triamcinolone cream (KENALOG) 0.5 % Apply 1 application topically 2 (two) times daily as needed (rash). 30 g 3  . vitamin B-12  (CYANOCOBALAMIN) 1000 MCG tablet Take 1,000 mcg by mouth daily.      . clindamycin (CLEOCIN) 300 MG capsule Take 1 capsule (300 mg total) by mouth 3 (three) times daily. (Patient not taking: Reported on 03/02/2016) 30 capsule 1  . ciprofloxacin (CIPRO) 500 MG tablet Reported on 03/02/2016     No facility-administered medications prior to visit.    ROS Review of Systems  Constitutional: Negative for appetite change, fatigue and unexpected weight change.  HENT: Negative for congestion, nosebleeds, sneezing, sore throat and trouble swallowing.   Eyes: Negative for itching and visual disturbance.  Respiratory: Negative for cough.   Cardiovascular: Negative for chest pain, palpitations and leg swelling.  Gastrointestinal: Negative for nausea, diarrhea, blood in stool and abdominal distention.  Genitourinary: Negative for frequency and hematuria.  Musculoskeletal: Positive for arthralgias and gait problem. Negative for back pain, joint swelling and neck pain.  Skin: Negative for rash and wound.  Neurological: Negative for dizziness, tremors, speech difficulty and weakness.  Psychiatric/Behavioral: Negative for suicidal ideas, sleep disturbance, dysphoric mood and agitation. The patient is not nervous/anxious.     Objective:  BP 116/62 mmHg  Pulse 69  Temp(Src) 98 F (36.7 C) (Oral)  Wt 356 lb (161.481 kg)  SpO2 92%  BP Readings from Last  3 Encounters:  03/02/16 116/62  03/01/16 107/46  02/10/16 109/58    Wt Readings from Last 3 Encounters:  03/02/16 356 lb (161.481 kg)  01/30/16 354 lb (160.573 kg)  12/31/15 366 lb (166.017 kg)    Physical Exam  Constitutional: He is oriented to person, place, and time. He appears well-developed. No distress.  NAD  HENT:  Mouth/Throat: Oropharynx is clear and moist.  Eyes: Conjunctivae are normal. Pupils are equal, round, and reactive to light.  Neck: Normal range of motion. No JVD present. No thyromegaly present.  Cardiovascular: Normal  rate, regular rhythm, normal heart sounds and intact distal pulses.  Exam reveals no gallop and no friction rub.   No murmur heard. Pulmonary/Chest: Effort normal and breath sounds normal. No respiratory distress. He has no wheezes. He has no rales. He exhibits no tenderness.  Abdominal: Soft. Bowel sounds are normal. He exhibits no distension and no mass. There is no tenderness. There is no rebound and no guarding.  Musculoskeletal: Normal range of motion. He exhibits no edema or tenderness.  Lymphadenopathy:    He has no cervical adenopathy.  Neurological: He is alert and oriented to person, place, and time. He has normal reflexes. No cranial nerve deficit. He exhibits normal muscle tone. He displays a negative Romberg sign. Coordination and gait normal.  Skin: Skin is warm and dry. No rash noted.  Psychiatric: He has a normal mood and affect. His behavior is normal. Judgment and thought content normal.  feet wounds are healed  Lab Results  Component Value Date   WBC 6.3 12/28/2015   HGB 10.3* 12/28/2015   HCT 32.4* 12/28/2015   PLT 208 12/28/2015   GLUCOSE 172* 01/30/2016   CHOL 122 09/12/2014   TRIG 53.0 09/12/2014   HDL 40.90 09/12/2014   LDLCALC 71 09/12/2014   ALT 11* 12/28/2015   AST 19 12/28/2015   NA 137 01/30/2016   K 4.4 01/30/2016   CL 103 01/30/2016   CREATININE 0.82 01/30/2016   BUN 11 01/30/2016   CO2 28 01/30/2016   TSH 1.19 04/18/2015   PSA 0.96 08/28/2010   INR 1.35 08/03/2014   HGBA1C 5.8 10/21/2015   MICROALBUR 1.4 08/28/2010    Dg Chest Port 1 View  12/26/2015  CLINICAL DATA:  Hypertension and hypoglycemia 1 day. Foot surgery 1 week ago. EXAM: PORTABLE CHEST 1 VIEW COMPARISON:  07/25/2014 FINDINGS: Lungs are adequately inflated without consolidation or effusion. Mild stable cardiomegaly. Calcified plaque over the aortic arch. Prominent overlying soft tissues. Degenerative change of the spine. IMPRESSION: Hypoinflation without acute cardiopulmonary disease.  Mild stable cardiomegaly. Electronically Signed   By: Marin Olp M.D.   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 Per sliding scale 100-120 per cbg. Dispense Humalog Kwikpen -     nitroGLYCERIN (NITROSTAT) 0.4 MG SL tablet; Place 1 tablet (0.4 mg total) under the tongue every 5 (five) minutes as needed. For chest pain  I have discontinued Mr. Mickelson's ciprofloxacin. I am also having  him maintain his vitamin B-12, Cholecalciferol, nitroGLYCERIN, insulin lispro, acetaminophen, silver sulfADIAZINE, triamcinolone cream, aspirin, gabapentin, metoprolol, pravastatin, glimepiride, pioglitazone, losartan, potassium chloride SA, clopidogrel, furosemide, and clindamycin.  No orders of the defined types were placed in this encounter.     Follow-up: Return in about 3 months (around 06/02/2016) for a follow-up visit.  [name], MD

## 2016-03-02 NOTE — Assessment & Plan Note (Addendum)
Glimepiride, Humalog KwikPen SS Labs

## 2016-03-02 NOTE — Assessment & Plan Note (Signed)
Reduce Losartan to 1/2 tab if SBP<100

## 2016-03-02 NOTE — Assessment & Plan Note (Signed)
Losartan, Lopressor, Lasix, Pravachol, Plavix, ASA

## 2016-03-02 NOTE — Progress Notes (Signed)
Patient ID: Charles [name], male   DOB: [birth year], 75 y.o.   MRN: [id number]  Subjective: Charles [name] is a 75 y.o. 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 He states he is doing well to the surgical site he has not had any drainage or swelling or any redness or red streaks. This continue to have a wound on the bottom on the right foot submetatarsal one. The patient's wife states that she continues with Silvadene. She denies any drainage. Denies any redness or warmth. Denies any other open sores. Denies any systemic complaints such as fevers, chills, nausea, vomiting. No calf pain, chest pain, shortness of breath.   Objective: General: No acute distress, AAOx3  DP/PT pulses palpable 2/4, CRT < 3 sec to all digits.  Protective sensation intact. Motor function intact.  Right foot: Incision is well coapted without any evidence of dehiscence and a scar has formed to the second toe. On the amputation site of the third toe there is a verysuperficial wound which appears to be a small abrasion where a scab came off within the central aspect of the incision this is from where the drain was removed. There is no probing to bone. There is no surrounding erythema or ascending cellulitis. There is no area of tenderness. There are no other open lesions or pre-ulcerative lesions identified at this time. There is no pain of the surgical foot. Left foot: There is a previous amputation of the hallux. Submetatarsal one is a superficial, annular, small granular wound that any probing, undermining or tunneling. It measures 0.4 x 0.3 cm after debridement. There is a hyperkerotic periwound. There is no surrounding erythema, ascending cellulitis, fluctuance, crepitus, malodor, drainage or pus. No other areas of tenderness to bilateral lower extremities.  No other open lesions or pre-ulcerative lesions.  No pain with calf compression, swelling, warmth, erythema.   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  -Treatment options discussed including all alternatives, risks, and complications -Continue Silvadene dressing changes the right foot daily. -Left side the wound was debrided without complications or bleeding to granular tissue. Continue with silver dressing changes. -Offloading. -Monitor for any clinical signs or symptoms of infection and directed to call the office immediately should any occur or go to the ER. -Follow-up in 3 weeks or sooner if any problems arise. In the meantime, encouraged to call the office with any questions, concerns, change in symptoms.   Charles [name], DPM   Cultures/Pathology 12/24/15 from surgery: Pathology (from [facility]) 3rd toe- osteomyelitis; margin viable 3rd toe margin- no OM 2nd toe- no acute OM; inflammation, ulcer, necrosis 2nd toe margin- No OM   Microbiology (from [facility]) 2nd toe margin- no growth  3rd toe margin- no growth   Charles [name], DPM

## 2016-03-10 ENCOUNTER — Encounter: Payer: Self-pay | Admitting: Internal Medicine

## 2016-03-12 ENCOUNTER — Telehealth: Payer: Self-pay | Admitting: *Deleted

## 2016-03-12 MED ORDER — INSULIN ASPART 100 UNIT/ML FLEXPEN: PEN_INJECTOR | SUBCUTANEOUS | Status: DC

## 2016-03-12 NOTE — Telephone Encounter (Signed)
Received call stating Humalog is a non-formulary on pt insurance the alternative would be Novolog flex pen. Wanting to know its ok to change to Novolog Flex Pen. OK to change to Novolog Flexpen updated med sheet...Charles [name]

## 2016-03-29 ENCOUNTER — Ambulatory Visit: Payer: Medicare Other | Admitting: Podiatry

## 2016-04-05 ENCOUNTER — Encounter: Payer: Self-pay | Admitting: Podiatry

## 2016-04-05 ENCOUNTER — Ambulatory Visit (INDEPENDENT_AMBULATORY_CARE_PROVIDER_SITE_OTHER): Payer: Medicare Other | Admitting: Podiatry

## 2016-04-05 VITALS — BP 142/66 | HR 62 | Resp 18

## 2016-04-05 DIAGNOSIS — E11621 Type 2 diabetes mellitus with foot ulcer: Secondary | ICD-10-CM | POA: Diagnosis not present

## 2016-04-05 DIAGNOSIS — L97529 Non-pressure chronic ulcer of other part of left foot with unspecified severity: Secondary | ICD-10-CM | POA: Diagnosis not present

## 2016-04-05 NOTE — Progress Notes (Signed)
Patient ID: KEYNER FINNIN, male   DOB: 04/03/1941, 75 y.o.   MRN: [id number]  Subjective: ARLIS GAYLER is a 75 y.o. is seen today in office s/p right 2nd and 3rd toe ampuations preformed on 12/24/15 and for wound on the bottom of the left foot submetatarsal one. The right foot is doing well the areas healed midline this any openings, drainage, redness or swelling. The left foot he does get some serous and was treated in the wound submetatarsal one. He has been on his foot more doing yard work and is Sunday get married studies done more standing which seem to aggravate some pain to this area and increase the drainage. Denies any pus. Denies any redness or red streaks. No malodor. Denies any systemic complaints such as fevers, chills, nausea, vomiting. No calf pain, chest pain, shortness of breath.   Objective: General: No acute distress, AAOx3  DP/PT pulses palpable 2/4, CRT < 3 sec to all digits.  Protective sensation intact. Motor function intact.  Right foot: Incisions in the right foot are both well-healed the scar is overlying the area. There is no open lesion. There is no edema, erythema. No drainage or pus. Left foot: Previous hallux AB Tatian. Submetatarsal one annular hyperkeratotic lesion with central ulceration. Upon treatment the wound does measure 0.4 x 0.3 cm appears be unchanged compared to last appointment. There is no probing, undermining or tunneling. There is granular wound base. No drainage or pus. No swelling erythema, ascending cellulitis, fluctuance, crepitus, malodor, drainage or pus. No other open lesions or pre-ulcer lesions identified this time. No pain with calf compression, swelling, warmth, erythema.   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 Continue daily dressing changes Silvadene. Offloading pads. Limited activity. -Left foot is healed. Continue to monitor. -Monitor for any clinical signs or symptoms of infection and directed to call the office immediately should any occur or go to the ER. -Follow-up in 3 weeks or sooner if any problems arise. In the meantime, encouraged to call the office with any questions, concerns, change in symptoms.   Celesta Gentile, DPM   Cultures/Pathology 12/24/15 from surgery: Pathology (from [facility]) 3rd toe- osteomyelitis; margin viable 3rd toe margin- no OM 2nd toe- no acute OM; inflammation, ulcer, necrosis 2nd toe margin- No OM   Microbiology (from [facility]) 2nd toe margin- no growth  3rd toe margin- no growth   Celesta Gentile, DPM

## 2016-04-12 ENCOUNTER — Ambulatory Visit: Payer: Medicare Other | Admitting: Podiatry

## 2016-04-30 ENCOUNTER — Ambulatory Visit (INDEPENDENT_AMBULATORY_CARE_PROVIDER_SITE_OTHER): Payer: Medicare Other | Admitting: Podiatry

## 2016-04-30 ENCOUNTER — Encounter: Payer: Self-pay | Admitting: Podiatry

## 2016-04-30 VITALS — BP 126/92 | HR 62 | Resp 18

## 2016-04-30 DIAGNOSIS — L97529 Non-pressure chronic ulcer of other part of left foot with unspecified severity: Principal | ICD-10-CM

## 2016-04-30 DIAGNOSIS — E11621 Type 2 diabetes mellitus with foot ulcer: Secondary | ICD-10-CM | POA: Diagnosis not present

## 2016-04-30 DIAGNOSIS — L89891 Pressure ulcer of other site, stage 1: Secondary | ICD-10-CM | POA: Diagnosis not present

## 2016-05-04 ENCOUNTER — Encounter: Payer: Self-pay | Admitting: Podiatry

## 2016-05-04 NOTE — Progress Notes (Signed)
Patient ID: Charles [name], male   DOB: August 28, 1941, 75 y.o.   MRN: [id number]  Subjective: Charles [name] is a 75 y.o. is seen today in office today for follow-up evaluation of submetatarsal one wound on the left foot. They've been continuing with packing the wound with prism. He has the blue including smaller although slowly. Denies any drainage or pus any swelling or redness. No malodor. States the right side is doing well and not caused any issues. Denies any systemic complaints such as fevers, chills, nausea, vomiting. No calf pain, chest pain, shortness of breath.   Objective: General: No acute distress, AAOx3  DP/PT pulses palpable 2/4, CRT < 3 sec to all digits.  Protective sensation intact. Motor function intact.  Right foot: Incisions in the right foot are both well-healed the scar is overlying the area. There is no open lesion. There is no edema, erythema. No drainage or pus. Left foot: Previous hallux amputation. Submetatarsal one annular hyperkeratotic lesion with central ulceration. Upon treatment the wound does measure 0.3 x 0.3 cm appears be slightly smaller compared to last appointment. There is no probing, undermining or tunneling. There is granular wound base. No drainage or pus. No swelling erythema, ascending cellulitis, fluctuance, crepitus, malodor, drainage or pus. No other open lesions or pre-ulcer lesions identified this time. No pain with calf compression, swelling, warmth, erythema.   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 Continue daily dressing changes with collagen. Offloading pads. Limited activity. -Left foot is healed. Continue to monitor. -Monitor for any clinical signs or symptoms of infection and directed to call the  office immediately should any occur or go to the ER. -Follow-up in 3 weeks or sooner if any problems arise. In the meantime, encouraged to call the office with any questions, concerns, change in symptoms.   Celesta Gentile, DPM   Cultures/Pathology 12/24/15 from right foot surgery: Pathology (from [facility]) 3rd toe- osteomyelitis; margin viable 3rd toe margin- no OM 2nd toe- no acute OM; inflammation, ulcer, necrosis 2nd toe margin- No OM   Microbiology (from [facility]) 2nd toe margin- no growth  3rd toe margin- no growth   Celesta Gentile, DPM

## 2016-05-21 ENCOUNTER — Encounter: Payer: Self-pay | Admitting: Podiatry

## 2016-05-21 ENCOUNTER — Ambulatory Visit (INDEPENDENT_AMBULATORY_CARE_PROVIDER_SITE_OTHER): Payer: Medicare Other | Admitting: Podiatry

## 2016-05-21 DIAGNOSIS — L89891 Pressure ulcer of other site, stage 1: Secondary | ICD-10-CM | POA: Diagnosis not present

## 2016-05-21 DIAGNOSIS — L97529 Non-pressure chronic ulcer of other part of left foot with unspecified severity: Principal | ICD-10-CM

## 2016-05-21 DIAGNOSIS — E11621 Type 2 diabetes mellitus with foot ulcer: Secondary | ICD-10-CM

## 2016-05-21 NOTE — Progress Notes (Signed)
Patient ID: Charles [name], male   DOB: 01/06/1941, 75 y.o.   MRN: [id number]  Subjective: Charles [name] is a 75 y.o. is seen today in office today for follow-up evaluation of submetatarsal one wound on the left foot. His last appointment the wound appears to be doing better and has had decreased drainage. Small amount of blood at times but no pus. No some redness or red streaks. No malodor. Denies any systemic complaints such as fevers, chills, nausea, vomiting. No calf pain, chest pain, shortness of breath.   Objective: General: No acute distress, AAOx3  DP/PT pulses palpable 2/4, CRT < 3 sec to all digits.  Protective sensation intact. Motor function intact.  Left foot: Previous hallux amputation. Submetatarsal one annular hyperkeratotic lesion with central ulceration. Upon treatment the wound does measure 0.4 x 0.3 cm appears be just about the same as last appointment. They wanted Spivey gr that's is no probing, undermining or tunneling. There is no swelling erythema, ascending cellulitis. No fluctuation or crepitus. No matter. No other open lesions identified this time. No other open lesions or pre-ulcer lesions identified this time. No pain with calf compression, swelling, warmth, erythema.   Assessment and Plan:  Left foot submetatarsal 1 ulceration   -Treatment options discussed including all alternatives, risks, and complications -Submetatarsal 1 ulceration left foot debrided without complications or bleeding. Continue daily dressing changes with collagen. Offloading pads. I again modified his inserts to take more pressure off the area and added a pad to help offloading. Limited activity. -Right foot is healed. Continue to monitor. -Monitor for any clinical signs or symptoms of infection and directed to call the office immediately should any occur or go to the ER. -Follow-up in 3 weeks or sooner if any problems arise. In the meantime, encouraged to call the office with any questions,  concerns, change in symptoms.   Celesta Gentile, DPM   Cultures/Pathology 12/24/15 from right foot surgery: Pathology (from [facility]) 3rd toe- osteomyelitis; margin viable 3rd toe margin- no OM 2nd toe- no acute OM; inflammation, ulcer, necrosis 2nd toe margin- No OM   Microbiology (from [facility]) 2nd toe margin- no growth  3rd toe margin- no growth   Celesta Gentile, DPM

## 2016-06-02 ENCOUNTER — Other Ambulatory Visit (INDEPENDENT_AMBULATORY_CARE_PROVIDER_SITE_OTHER): Payer: Medicare Other

## 2016-06-02 ENCOUNTER — Ambulatory Visit (INDEPENDENT_AMBULATORY_CARE_PROVIDER_SITE_OTHER): Payer: Medicare Other | Admitting: Internal Medicine

## 2016-06-02 ENCOUNTER — Encounter: Payer: Self-pay | Admitting: Internal Medicine

## 2016-06-02 VITALS — BP 140/80 | HR 72 | Wt 362.0 lb

## 2016-06-02 DIAGNOSIS — E113399 Type 2 diabetes mellitus with moderate nonproliferative diabetic retinopathy without macular edema, unspecified eye: Secondary | ICD-10-CM

## 2016-06-02 DIAGNOSIS — E11628 Type 2 diabetes mellitus with other skin complications: Secondary | ICD-10-CM

## 2016-06-02 DIAGNOSIS — E1165 Type 2 diabetes mellitus with hyperglycemia: Secondary | ICD-10-CM

## 2016-06-02 DIAGNOSIS — I2510 Atherosclerotic heart disease of native coronary artery without angina pectoris: Secondary | ICD-10-CM | POA: Diagnosis not present

## 2016-06-02 DIAGNOSIS — E13628 Other specified diabetes mellitus with other skin complications: Secondary | ICD-10-CM

## 2016-06-02 DIAGNOSIS — IMO0002 Reserved for concepts with insufficient information to code with codable children: Secondary | ICD-10-CM

## 2016-06-02 DIAGNOSIS — L03119 Cellulitis of unspecified part of limb: Secondary | ICD-10-CM

## 2016-06-02 DIAGNOSIS — Z794 Long term (current) use of insulin: Secondary | ICD-10-CM

## 2016-06-02 DIAGNOSIS — I480 Paroxysmal atrial fibrillation: Secondary | ICD-10-CM

## 2016-06-02 DIAGNOSIS — H109 Unspecified conjunctivitis: Secondary | ICD-10-CM | POA: Insufficient documentation

## 2016-06-02 DIAGNOSIS — R0602 Shortness of breath: Secondary | ICD-10-CM | POA: Diagnosis not present

## 2016-06-02 DIAGNOSIS — I10 Essential (primary) hypertension: Secondary | ICD-10-CM

## 2016-06-02 LAB — BASIC METABOLIC PANEL
BUN: 19 mg/dL (ref 6–23)
CALCIUM: 9.4 mg/dL (ref 8.4–10.5)
CO2: 30 meq/L (ref 19–32)
Chloride: 104 mEq/L (ref 96–112)
Creatinine, Ser: 0.88 mg/dL (ref 0.40–1.50)
GFR: 89.79 mL/min (ref >60.00)
GLUCOSE: 134 mg/dL — AB (ref 70–99)
Potassium: 5.2 mEq/L — ABNORMAL HIGH (ref 3.5–5.1)
SODIUM: 139 meq/L (ref 135–145)

## 2016-06-02 LAB — HEMOGLOBIN A1C: Hgb A1c MFr Bld: 6.2 % (ref 4.6–6.5)

## 2016-06-02 MED ORDER — ERYTHROMYCIN 5 MG/GM OP OINT: 1.0000 "application " | TOPICAL_OINTMENT | Freq: Three times a day (TID) | OPHTHALMIC | Status: DC

## 2016-06-02 MED ORDER — PIOGLITAZONE HCL 45 MG PO TABS: 45.0000 mg | ORAL_TABLET | Freq: Every day | ORAL | Status: DC

## 2016-06-02 MED ORDER — GLIMEPIRIDE 4 MG PO TABS: 4.0000 mg | ORAL_TABLET | Freq: Two times a day (BID) | ORAL | Status: DC

## 2016-06-02 MED ORDER — DOXYCYCLINE HYCLATE 100 MG PO TABS: 100.0000 mg | ORAL_TABLET | Freq: Two times a day (BID) | ORAL | Status: DC

## 2016-06-02 MED ORDER — FUROSEMIDE 80 MG PO TABS: ORAL_TABLET | ORAL | Status: DC

## 2016-06-02 MED ORDER — LOSARTAN POTASSIUM 100 MG PO TABS: 100.0000 mg | ORAL_TABLET | Freq: Every day | ORAL | Status: DC

## 2016-06-02 MED ORDER — GABAPENTIN 300 MG PO CAPS: ORAL_CAPSULE | ORAL | Status: DC

## 2016-06-02 MED ORDER — CLOPIDOGREL BISULFATE 75 MG PO TABS: 75.0000 mg | ORAL_TABLET | Freq: Every day | ORAL | Status: DC

## 2016-06-02 MED ORDER — POTASSIUM CHLORIDE CRYS ER 20 MEQ PO TBCR: 40.0000 meq | EXTENDED_RELEASE_TABLET | Freq: Every day | ORAL | Status: DC

## 2016-06-02 MED ORDER — PRAVASTATIN SODIUM 40 MG PO TABS: 40.0000 mg | ORAL_TABLET | Freq: Every day | ORAL | Status: DC

## 2016-06-02 MED ORDER — METOPROLOL TARTRATE 50 MG PO TABS: 50.0000 mg | ORAL_TABLET | Freq: Two times a day (BID) | ORAL | Status: DC

## 2016-06-02 NOTE — Progress Notes (Signed)
Pre visit review using our clinic review tool, if applicable. No additional management support is needed unless otherwise documented below in the visit note. 

## 2016-06-02 NOTE — Progress Notes (Signed)
Subjective:  Patient ID: Charles [name], male    DOB: 07-14-1941  Age: 75 y.o. MRN: [id number]  CC: No chief complaint on file.   HPI Charles [name] presents for a fall The pt hit his L shin and knee - red, painful F/u DM, HTN  Outpatient Prescriptions Prior to Visit  Medication Sig Dispense Refill  . acetaminophen (TYLENOL) 325 MG tablet Take 2 tablets (650 mg total) by mouth every 6 (six) hours as needed for mild pain, fever or headache (or Fever >/= 101).    Marland Kitchen aspirin 81 MG tablet Take 81 mg by mouth daily.    . Cholecalciferol (EQL VITAMIN D3) 1000 UNITS tablet Take 1,000 Units by mouth daily.      . clindamycin (CLEOCIN) 300 MG capsule Take 1 capsule (300 mg total) by mouth 3 (three) times daily. 30 capsule 1  . clopidogrel (PLAVIX) 75 MG tablet TAKE 1 TABLET EVERY DAY 90 tablet 3  . Fluticasone-Salmeterol (ADVAIR DISKUS) 100-50 MCG/DOSE AEPB Inhale 1 puff into the lungs 2 (two) times daily. 3 each 3  . furosemide (LASIX) 80 MG tablet Take 1/2 tablet daily alternating with whole tablet daily 30 tablet 0  . gabapentin (NEURONTIN) 300 MG capsule TAKE 1 CAPSULE THREE TIMES DAILY AS NEEDED FOR NERVE PAIN 270 capsule 1  . glimepiride (AMARYL) 4 MG tablet Take 1 tablet by mouth 2 (two) times daily.    . insulin aspart (NOVOLOG FLEXPEN) 100 UNIT/ML FlexPen Use 5-20 units four times a day after meals and at bedtime Per sliding scale 100-120 cbg 75 mL 3  . losartan (COZAAR) 100 MG tablet Take 1 tablet (100 mg total) by mouth daily. 90 tablet 3  . metoprolol (LOPRESSOR) 50 MG tablet TAKE 1 TABLET TWICE DAILY 180 tablet 3  . nitroGLYCERIN (NITROSTAT) 0.4 MG SL tablet Place 1 tablet (0.4 mg total) under the tongue every 5 (five) minutes as needed. For chest pain 20 tablet 2  . pioglitazone (ACTOS) 45 MG tablet Take 1 tablet by mouth daily.    . potassium chloride SA (K-DUR,KLOR-CON) 20 MEQ tablet TAKE 2 TABLETS EVERY DAY 180 tablet 3  . pravastatin (PRAVACHOL) 40 MG tablet TAKE 1 TABLET  DAILY. 90 tablet 3  . silver sulfADIAZINE (SILVADENE) 1 % cream Apply 1 application topically daily. 50 g 2  . triamcinolone cream (KENALOG) 0.5 % Apply 1 application topically 2 (two) times daily as needed (rash). 30 g 3  . vitamin B-12 (CYANOCOBALAMIN) 1000 MCG tablet Take 1,000 mcg by mouth daily.      Marland Kitchen azithromycin (ZITHROMAX) 250 MG tablet As directed 6 tablet 0   No facility-administered medications prior to visit.    ROS Review of Systems  Constitutional: Negative for appetite change, fatigue and unexpected weight change.  HENT: Negative for congestion, nosebleeds, sneezing, sore throat and trouble swallowing.   Eyes: Negative for itching and visual disturbance.  Respiratory: Negative for cough.   Cardiovascular: Positive for leg swelling. Negative for chest pain and palpitations.  Gastrointestinal: Negative for nausea, diarrhea, blood in stool and abdominal distention.  Genitourinary: Negative for frequency and hematuria.  Musculoskeletal: Positive for arthralgias and gait problem. Negative for back pain, joint swelling and neck pain.  Skin: Positive for color change and wound. Negative for rash.  Neurological: Negative for dizziness, tremors, speech difficulty and weakness.  Psychiatric/Behavioral: Negative for sleep disturbance, dysphoric mood and agitation. The patient is not nervous/anxious.     Objective:  BP 140/80 mmHg  Pulse 72  Wt 362 lb (164.202 kg)  SpO2 97%  BP Readings from Last 3 Encounters:  06/02/16 140/80  04/30/16 126/92  04/05/16 142/66    Wt Readings from Last 3 Encounters:  06/02/16 362 lb (164.202 kg)  03/02/16 356 lb (161.481 kg)  01/30/16 354 lb (160.573 kg)    Physical Exam  Constitutional: He is oriented to person, place, and time. He appears well-developed. No distress.  NAD  HENT:  Mouth/Throat: Oropharynx is clear and moist.  Eyes: Conjunctivae are normal. Pupils are equal, round, and reactive to light.  Neck: Normal range of  motion. No JVD present. No thyromegaly present.  Cardiovascular: Normal rate, regular rhythm, normal heart sounds and intact distal pulses.  Exam reveals no gallop and no friction rub.   No murmur heard. Pulmonary/Chest: Effort normal and breath sounds normal. No respiratory distress. He has no wheezes. He has no rales. He exhibits no tenderness.  Abdominal: Soft. Bowel sounds are normal. He exhibits no distension and no mass. There is no tenderness. There is no rebound and no guarding.  Musculoskeletal: Normal range of motion. He exhibits edema and tenderness.  Lymphadenopathy:    He has no cervical adenopathy.  Neurological: He is alert and oriented to person, place, and time. He has normal reflexes. No cranial nerve deficit. He exhibits normal muscle tone. He displays a negative Romberg sign. Coordination and gait normal.  Skin: Skin is warm and dry. No rash noted.  Psychiatric: He has a normal mood and affect. His behavior is normal. Judgment and thought content normal.  trace to 1+ edema B R shin w/erythema and 1x1.5 cm wound w/a/scab  Lab Results  Component Value Date   WBC 6.3 12/28/2015   HGB 10.3* 12/28/2015   HCT 32.4* 12/28/2015   PLT 208 12/28/2015   GLUCOSE 96 03/02/2016   CHOL 122 09/12/2014   TRIG 53.0 09/12/2014   HDL 40.90 09/12/2014   LDLCALC 71 09/12/2014   ALT 11* 12/28/2015   AST 19 12/28/2015   NA 140 03/02/2016   K 4.6 03/02/2016   CL 104 03/02/2016   CREATININE 0.83 03/02/2016   BUN 17 03/02/2016   CO2 32 03/02/2016   TSH 1.19 04/18/2015   PSA 0.96 08/28/2010   INR 1.35 08/03/2014   HGBA1C 6.6* 03/02/2016   MICROALBUR 1.4 08/28/2010    Dg Chest Port 1 View  12/26/2015  CLINICAL DATA:  Hypertension and hypoglycemia 1 day. Foot surgery 1 week ago. EXAM: PORTABLE CHEST 1 VIEW COMPARISON:  07/25/2014 FINDINGS: Lungs are adequately inflated without consolidation or effusion. Mild stable cardiomegaly. Calcified plaque over the aortic arch. Prominent  overlying soft tissues. Degenerative change of the spine. IMPRESSION: Hypoinflation without acute cardiopulmonary disease. Mild stable cardiomegaly. Electronically Signed   By: Marin Olp M.D.   On: 12/26/2015 14:16    Assessment & Plan:   Diagnoses and all orders for this visit:  Atherosclerosis of native coronary artery of native heart without angina pectoris -     furosemide (LASIX) 80 MG tablet; Take 1/2 tablet daily alternating with whole tablet daily  SOB (shortness of breath) -     furosemide (LASIX) 80 MG tablet; Take 1/2 tablet daily alternating with whole tablet daily  Other orders -     clopidogrel (PLAVIX) 75 MG tablet; Take 1 tablet (75 mg total) by mouth daily. -     gabapentin (NEURONTIN) 300 MG capsule;  -     glimepiride (AMARYL) 4 MG tablet; Take 1 tablet (4 mg total) by  mouth 2 (two) times daily. -     losartan (COZAAR) 100 MG tablet; Take 1 tablet (100 mg total) by mouth daily. -     metoprolol (LOPRESSOR) 50 MG tablet; Take 1 tablet (50 mg total) by mouth 2 (two) times daily. -     pioglitazone (ACTOS) 45 MG tablet; Take 1 tablet (45 mg total) by mouth daily. -     potassium chloride SA (K-DUR,KLOR-CON) 20 MEQ tablet; Take 2 tablets (40 mEq total) by mouth daily. -     pravastatin (PRAVACHOL) 40 MG tablet; Take 1 tablet (40 mg total) by mouth daily.   I have discontinued Mr. Manfredonia's azithromycin. I am also having him maintain his vitamin B-12, Cholecalciferol, acetaminophen, silver sulfADIAZINE, triamcinolone cream, aspirin, gabapentin, metoprolol, pravastatin, glimepiride, pioglitazone, losartan, potassium chloride SA, clopidogrel, furosemide, clindamycin, nitroGLYCERIN, Fluticasone-Salmeterol, and insulin aspart.  No orders of the defined types were placed in this encounter.     Follow-up: No Follow-up on file.  [name], MD

## 2016-06-02 NOTE — Assessment & Plan Note (Signed)
6/17 L eye Erythromycin oint

## 2016-06-02 NOTE — Assessment & Plan Note (Signed)
Doxy

## 2016-06-02 NOTE — Assessment & Plan Note (Signed)
Losartan, Lopressor, Lasix 

## 2016-06-02 NOTE — Assessment & Plan Note (Signed)
Labs

## 2016-06-02 NOTE — Assessment & Plan Note (Signed)
Lopressor, Plavix, ASA 

## 2016-06-03 ENCOUNTER — Telehealth: Payer: Self-pay | Admitting: *Deleted

## 2016-06-03 MED ORDER — DOXYCYCLINE HYCLATE 100 MG PO CAPS: 100.0000 mg | ORAL_CAPSULE | Freq: Two times a day (BID) | ORAL | Status: DC

## 2016-06-03 NOTE — Telephone Encounter (Signed)
Sent rx...Charles [name]

## 2016-06-03 NOTE — Telephone Encounter (Signed)
Rec'd fax stating md sent rx for Doxycycline 100 mg tablets currently out of stock. Wanting to know if they can change to capsules...Charles [name]

## 2016-06-03 NOTE — Telephone Encounter (Signed)
Yes. Thx.

## 2016-06-11 ENCOUNTER — Ambulatory Visit (INDEPENDENT_AMBULATORY_CARE_PROVIDER_SITE_OTHER): Payer: Medicare Other

## 2016-06-11 ENCOUNTER — Ambulatory Visit (INDEPENDENT_AMBULATORY_CARE_PROVIDER_SITE_OTHER): Payer: Medicare Other | Admitting: Podiatry

## 2016-06-11 ENCOUNTER — Encounter: Payer: Self-pay | Admitting: Podiatry

## 2016-06-11 VITALS — BP 118/59 | HR 64 | Resp 16

## 2016-06-11 DIAGNOSIS — E11621 Type 2 diabetes mellitus with foot ulcer: Secondary | ICD-10-CM | POA: Diagnosis not present

## 2016-06-11 DIAGNOSIS — L97529 Non-pressure chronic ulcer of other part of left foot with unspecified severity: Secondary | ICD-10-CM

## 2016-06-11 DIAGNOSIS — L03116 Cellulitis of left lower limb: Secondary | ICD-10-CM

## 2016-06-11 DIAGNOSIS — I2510 Atherosclerotic heart disease of native coronary artery without angina pectoris: Secondary | ICD-10-CM

## 2016-06-11 DIAGNOSIS — L89891 Pressure ulcer of other site, stage 1: Secondary | ICD-10-CM

## 2016-06-11 MED ORDER — DOXYCYCLINE HYCLATE 100 MG PO CAPS: 100.0000 mg | ORAL_CAPSULE | Freq: Two times a day (BID) | ORAL | Status: DC

## 2016-06-21 ENCOUNTER — Encounter: Payer: Self-pay | Admitting: Podiatry

## 2016-06-21 NOTE — Progress Notes (Signed)
Patient ID: Charles [name], male   DOB: 1941-08-31, 75 y.o.   MRN: [id number]  Subjective: Charles [name] is a 75 y.o. is seen today in office today for follow-up evaluation of submetatarsal one wound on the left foot. He believes the area has improved. His wife states that she has noticed it becoming somewhat swollen around the area. Denies any drainage or pus. No pain.  Denies any systemic complaints such as fevers, chills, nausea, vomiting. No calf pain, chest pain, shortness of breath.   Objective: General: No acute distress, AAOx3  DP/PT pulses palpable 2/4, CRT < 3 sec to all digits.  Protective sensation intact. Motor function intact.  Left foot: Previous hallux amputation. Submetatarsal one annular hyperkeratotic lesion with central ulceration. There appears to be more hyperkeratotic tissue today compared to previous appointments. Upon treatment the wound does measure 0.5 x 0.5 x 0.2 cm  And has increased in size. There is no probing to bone. There is no undermining or tunneling. Due to [facility] some localized edema and a faint amount of erythema to this area. Right foot surgical incisions are healed. No other open lesions or pre-ulcer lesions identified this time. No pain with calf compression, swelling, warmth, erythema.   Assessment and Plan:  Left foot submetatarsal 1 ulceration With localized infection  -Treatment options discussed including all alternatives, risks, and complications -X-rays were obtained and reviewed with the patient.  -Wound was debrided to granular tissue. Continue daily dressing changes with Silvadene for now. Will start doxycycline.  -Offloading  -Monitor for any clinical signs or symptoms of infection and directed to call the office immediately should any occur or go to the ER. -Follow-up as scheduled or sooner if any problems arise. In the meantime, encouraged to call the office with any questions, concerns, change in symptoms.   Charles [name],  DPM   Cultures/Pathology 12/24/15 from right foot surgery: Pathology (from [facility]) 3rd toe- osteomyelitis; margin viable 3rd toe margin- no OM 2nd toe- no acute OM; inflammation, ulcer, necrosis 2nd toe margin- No OM   Microbiology (from [facility]) 2nd toe margin- no growth  3rd toe margin- no growth   Charles [name], DPM

## 2016-06-25 ENCOUNTER — Encounter: Payer: Self-pay | Admitting: Podiatry

## 2016-06-25 ENCOUNTER — Ambulatory Visit (INDEPENDENT_AMBULATORY_CARE_PROVIDER_SITE_OTHER): Payer: Medicare Other | Admitting: Podiatry

## 2016-06-25 VITALS — Temp 97.5°F

## 2016-06-25 DIAGNOSIS — L89891 Pressure ulcer of other site, stage 1: Secondary | ICD-10-CM | POA: Diagnosis not present

## 2016-06-25 DIAGNOSIS — E11621 Type 2 diabetes mellitus with foot ulcer: Secondary | ICD-10-CM

## 2016-06-25 DIAGNOSIS — L97522 Non-pressure chronic ulcer of other part of left foot with fat layer exposed: Secondary | ICD-10-CM

## 2016-06-25 DIAGNOSIS — L03116 Cellulitis of left lower limb: Secondary | ICD-10-CM

## 2016-06-25 DIAGNOSIS — L97529 Non-pressure chronic ulcer of other part of left foot with unspecified severity: Secondary | ICD-10-CM

## 2016-07-05 NOTE — Progress Notes (Signed)
Patient ID: Charles [name], male   DOB: July 03, 1941, 75 y.o.   MRN: [id number]  Subjective: Charles [name] is a 75 y.o. is seen today in office today for follow-up evaluation of submetatarsal one wound on the left foot. His wife states the swelling and the redness has come down quite a bit. Denies any pus coming from the wound he gets some occasional bloody drainage however. No pain. Denies any systemic complaints such as fevers, chills, nausea, vomiting. No calf pain, chest pain, shortness of breath.   Objective: General: No acute distress, AAOx3  DP/PT pulses palpable, CRT < 3 sec to all digits.  Motor function intact.  Left foot: Previous hallux amputation. Submetatarsal one ulceration with hyperkeratotic periwound the left foot. Upon debridement the wound does measure about the same size of 0.5 x 0.5 x 0.2 cm. There is no probing, undermining or tunneling. There is decreased edema and erythema around the wound. There is no ascending synovitis. There is no fluctuance or crepitus. There is no malodor. No other open lesions or pre-ulcerative lesions within for this time. No other open lesions or pre-ulcer lesions identified this time. No pain with calf compression, swelling, warmth, erythema.   Assessment and Plan:  Left foot submetatarsal 1 ulceration with resolving infection  -Treatment options discussed including all alternatives, risks, and complications -Hyperkeratotic tissue/wound was debrided to granular tissue with a scalpel. Continue daily dressing changes. Finish course of antibiotics. -Offloading  -Monitor for any clinical signs or symptoms of infection and directed to call the office immediately should any occur or go to the ER. -Follow-up as scheduled or sooner if any problems arise. In the meantime, encouraged to call the office with any questions, concerns, change in symptoms.   Celesta Gentile, DPM   Cultures/Pathology 12/24/15 from right foot surgery: Pathology (from  [facility]) 3rd toe- osteomyelitis; margin viable 3rd toe margin- no OM 2nd toe- no acute OM; inflammation, ulcer, necrosis 2nd toe margin- No OM   Microbiology (from [facility]) 2nd toe margin- no growth  3rd toe margin- no growth   Celesta Gentile, DPM

## 2016-07-16 ENCOUNTER — Ambulatory Visit: Payer: Medicare Other | Admitting: Podiatry

## 2016-07-19 ENCOUNTER — Ambulatory Visit (INDEPENDENT_AMBULATORY_CARE_PROVIDER_SITE_OTHER): Payer: Medicare Other | Admitting: Podiatry

## 2016-07-19 ENCOUNTER — Encounter: Payer: Self-pay | Admitting: Podiatry

## 2016-07-19 DIAGNOSIS — E11621 Type 2 diabetes mellitus with foot ulcer: Secondary | ICD-10-CM

## 2016-07-19 DIAGNOSIS — L97529 Non-pressure chronic ulcer of other part of left foot with unspecified severity: Principal | ICD-10-CM

## 2016-07-19 DIAGNOSIS — L97522 Non-pressure chronic ulcer of other part of left foot with fat layer exposed: Secondary | ICD-10-CM

## 2016-07-19 DIAGNOSIS — L89891 Pressure ulcer of other site, stage 1: Secondary | ICD-10-CM

## 2016-07-19 MED ORDER — AMOXICILLIN-POT CLAVULANATE 875-125 MG PO TABS: 1.0000 | ORAL_TABLET | Freq: Two times a day (BID) | ORAL | 0 refills | Status: DC

## 2016-07-19 NOTE — Progress Notes (Signed)
Patient ID: JT HAEFS, male   DOB: 1941/04/22, 75 y.o.   MRN: [id number]  Subjective: Charles [name] is a 75 y.o. is seen today in office today for follow-up evaluation of submetatarsal one wound on the left foot. His wife says that his been some drainage coming from the area denies any pus is been cleared bloody. 90 surrounding redness or red streaks. The patient states the area is painful at times. Denies any systemic complaints such as fevers, chills, nausea, vomiting. No calf pain, chest pain, shortness of breath.   Objective: General: No acute distress, AAOx3  DP/PT pulses palpable, CRT < 3 sec to all digits.  Motor function intact.  Left foot: Previous hallux amputation. Submetatarsal one ulceration with hyperkeratotic periwound the left foot. Upon debridement the wound does measure larger at 1.2 x 1 x 0.5 cm. There is no probing, undermining or tunneling. There is minimal edema but no erythema around the wound. There is no ascending cellulitis. There is no fluctuance or crepitus. There is no malodor. No other open lesions or pre-ulcerative lesions within for this time. No other open lesions or pre-ulcer lesions identified this time. No pain with calf compression, swelling, warmth, erythema.   Assessment and Plan:  Left foot submetatarsal 1 ulceration which has increased in size.   -Treatment options discussed including all alternatives, risks, and complications -Hyperkeratotic tissue/wound was debrided to granular tissue with a scalpel. What or collagen silver dressing to do daily. For now he is Silvadene. -Augmentin as the wound has increased in size and painful -Offloading  -Monitor for any clinical signs or symptoms of infection and directed to call the office immediately should any occur or go to the ER. -Follow-up in 3 weeks or sooner if any problems arise. In the meantime, encouraged to call the office with any questions, concerns, change in symptoms.   Celesta Gentile,  DPM   Cultures/Pathology 12/24/15 from right foot surgery: Pathology (from [facility]) 3rd toe- osteomyelitis; margin viable 3rd toe margin- no OM 2nd toe- no acute OM; inflammation, ulcer, necrosis 2nd toe margin- No OM   Microbiology (from [facility]) 2nd toe margin- no growth  3rd toe margin- no growth   Celesta Gentile, DPM

## 2016-08-09 ENCOUNTER — Encounter: Payer: Self-pay | Admitting: Podiatry

## 2016-08-09 ENCOUNTER — Ambulatory Visit (INDEPENDENT_AMBULATORY_CARE_PROVIDER_SITE_OTHER): Payer: Medicare Other | Admitting: Podiatry

## 2016-08-09 DIAGNOSIS — E11621 Type 2 diabetes mellitus with foot ulcer: Secondary | ICD-10-CM

## 2016-08-09 DIAGNOSIS — L89891 Pressure ulcer of other site, stage 1: Secondary | ICD-10-CM

## 2016-08-09 DIAGNOSIS — L97529 Non-pressure chronic ulcer of other part of left foot with unspecified severity: Principal | ICD-10-CM

## 2016-08-09 DIAGNOSIS — L97522 Non-pressure chronic ulcer of other part of left foot with fat layer exposed: Secondary | ICD-10-CM

## 2016-08-15 ENCOUNTER — Encounter (HOSPITAL_COMMUNITY): Payer: Self-pay | Admitting: Nurse Practitioner

## 2016-08-15 ENCOUNTER — Emergency Department (HOSPITAL_COMMUNITY): Payer: Medicare Other

## 2016-08-15 ENCOUNTER — Inpatient Hospital Stay (HOSPITAL_COMMUNITY)
Admission: EM | Admit: 2016-08-15 | Discharge: 2016-08-20 | DRG: 250 | Disposition: A | Discharge status: Home / Self Care | Payer: Medicare Other | Attending: Internal Medicine | Admitting: Internal Medicine

## 2016-08-15 DIAGNOSIS — I200 Unstable angina: Secondary | ICD-10-CM | POA: Diagnosis not present

## 2016-08-15 DIAGNOSIS — G4733 Obstructive sleep apnea (adult) (pediatric): Secondary | ICD-10-CM | POA: Diagnosis present

## 2016-08-15 DIAGNOSIS — Z79899 Other long term (current) drug therapy: Secondary | ICD-10-CM

## 2016-08-15 DIAGNOSIS — H9190 Unspecified hearing loss, unspecified ear: Secondary | ICD-10-CM | POA: Diagnosis present

## 2016-08-15 DIAGNOSIS — Z8249 Family history of ischemic heart disease and other diseases of the circulatory system: Secondary | ICD-10-CM

## 2016-08-15 DIAGNOSIS — F40240 Claustrophobia: Secondary | ICD-10-CM | POA: Diagnosis present

## 2016-08-15 DIAGNOSIS — R600 Localized edema: Secondary | ICD-10-CM | POA: Diagnosis present

## 2016-08-15 DIAGNOSIS — Z888 Allergy status to other drugs, medicaments and biological substances status: Secondary | ICD-10-CM | POA: Diagnosis not present

## 2016-08-15 DIAGNOSIS — I2510 Atherosclerotic heart disease of native coronary artery without angina pectoris: Secondary | ICD-10-CM | POA: Diagnosis not present

## 2016-08-15 DIAGNOSIS — I252 Old myocardial infarction: Secondary | ICD-10-CM

## 2016-08-15 DIAGNOSIS — Z89421 Acquired absence of other right toe(s): Secondary | ICD-10-CM | POA: Diagnosis not present

## 2016-08-15 DIAGNOSIS — Z8614 Personal history of Methicillin resistant Staphylococcus aureus infection: Secondary | ICD-10-CM | POA: Diagnosis not present

## 2016-08-15 DIAGNOSIS — I878 Other specified disorders of veins: Secondary | ICD-10-CM | POA: Diagnosis present

## 2016-08-15 DIAGNOSIS — I25110 Atherosclerotic heart disease of native coronary artery with unstable angina pectoris: Secondary | ICD-10-CM | POA: Diagnosis not present

## 2016-08-15 DIAGNOSIS — E7800 Pure hypercholesterolemia, unspecified: Secondary | ICD-10-CM | POA: Diagnosis not present

## 2016-08-15 DIAGNOSIS — Z7982 Long term (current) use of aspirin: Secondary | ICD-10-CM | POA: Diagnosis not present

## 2016-08-15 DIAGNOSIS — H548 Legal blindness, as defined in USA: Secondary | ICD-10-CM | POA: Diagnosis present

## 2016-08-15 DIAGNOSIS — Z794 Long term (current) use of insulin: Secondary | ICD-10-CM

## 2016-08-15 DIAGNOSIS — Q248 Other specified congenital malformations of heart: Secondary | ICD-10-CM | POA: Diagnosis not present

## 2016-08-15 DIAGNOSIS — Z8673 Personal history of transient ischemic attack (TIA), and cerebral infarction without residual deficits: Secondary | ICD-10-CM

## 2016-08-15 DIAGNOSIS — I5033 Acute on chronic diastolic (congestive) heart failure: Secondary | ICD-10-CM | POA: Diagnosis not present

## 2016-08-15 DIAGNOSIS — J90 Pleural effusion, not elsewhere classified: Secondary | ICD-10-CM | POA: Diagnosis not present

## 2016-08-15 DIAGNOSIS — I110 Hypertensive heart disease with heart failure: Secondary | ICD-10-CM | POA: Diagnosis not present

## 2016-08-15 DIAGNOSIS — Z6841 Body Mass Index (BMI) 40.0 and over, adult: Secondary | ICD-10-CM

## 2016-08-15 DIAGNOSIS — I480 Paroxysmal atrial fibrillation: Secondary | ICD-10-CM | POA: Diagnosis not present

## 2016-08-15 DIAGNOSIS — E1140 Type 2 diabetes mellitus with diabetic neuropathy, unspecified: Secondary | ICD-10-CM | POA: Diagnosis not present

## 2016-08-15 DIAGNOSIS — R0789 Other chest pain: Secondary | ICD-10-CM | POA: Diagnosis not present

## 2016-08-15 DIAGNOSIS — E785 Hyperlipidemia, unspecified: Secondary | ICD-10-CM | POA: Diagnosis not present

## 2016-08-15 DIAGNOSIS — I5043 Acute on chronic combined systolic (congestive) and diastolic (congestive) heart failure: Secondary | ICD-10-CM

## 2016-08-15 DIAGNOSIS — I10 Essential (primary) hypertension: Secondary | ICD-10-CM | POA: Diagnosis present

## 2016-08-15 DIAGNOSIS — Z87891 Personal history of nicotine dependence: Secondary | ICD-10-CM

## 2016-08-15 HISTORY — DX: Legal blindness, as defined in USA: H54.8

## 2016-08-15 HISTORY — DX: Unspecified osteoarthritis, unspecified site: M19.90

## 2016-08-15 HISTORY — DX: Essential (primary) hypertension: I10

## 2016-08-15 HISTORY — DX: Localized edema: R60.0

## 2016-08-15 HISTORY — DX: Unspecified hearing loss, unspecified ear: H91.90

## 2016-08-15 HISTORY — DX: Chronic diastolic (congestive) heart failure: I50.32

## 2016-08-15 HISTORY — DX: Other specified disorders of veins: I87.8

## 2016-08-15 LAB — BASIC METABOLIC PANEL
ANION GAP: 3 — AB (ref 5–15)
BUN: 13 mg/dL (ref 6–20)
CO2: 29 mmol/L (ref 22–32)
Calcium: 9.2 mg/dL (ref 8.9–10.3)
Chloride: 107 mmol/L (ref 101–111)
Creatinine, Ser: 0.91 mg/dL (ref 0.61–1.24)
GFR calc Af Amer: >60 mL/min (ref >60)
GFR calc non Af Amer: >60 mL/min (ref >60)
GLUCOSE: 130 mg/dL — AB (ref 65–99)
POTASSIUM: 4.7 mmol/L (ref 3.5–5.1)
Sodium: 139 mmol/L (ref 135–145)

## 2016-08-15 LAB — CBC
HEMATOCRIT: 42.1 % (ref 39.0–52.0)
HEMOGLOBIN: 12.6 g/dL — AB (ref 13.0–17.0)
MCH: 30.1 pg (ref 26.0–34.0)
MCHC: 29.9 g/dL — AB (ref 30.0–36.0)
MCV: 100.7 fL — AB (ref 78.0–100.0)
Platelets: 236 10*3/uL (ref 150–400)
RBC: 4.18 MIL/uL — ABNORMAL LOW (ref 4.22–5.81)
RDW: 15.4 % (ref 11.5–15.5)
WBC: 6.4 10*3/uL (ref 4.0–10.5)

## 2016-08-15 LAB — I-STAT TROPONIN, ED: Troponin i, poc: 0.04 ng/mL (ref 0.00–0.08)

## 2016-08-15 LAB — PROTIME-INR
INR: 1.11
PROTHROMBIN TIME: 14.3 s (ref 11.4–15.2)

## 2016-08-15 LAB — TROPONIN I: Troponin I: 0.03 ng/mL (ref <0.03)

## 2016-08-15 LAB — GLUCOSE, CAPILLARY
GLUCOSE-CAPILLARY: 99 mg/dL (ref 65–99)
Glucose-Capillary: 138 mg/dL — ABNORMAL HIGH (ref 65–99)

## 2016-08-15 LAB — BRAIN NATRIURETIC PEPTIDE: B Natriuretic Peptide: 336.5 pg/mL — ABNORMAL HIGH (ref 0.0–100.0)

## 2016-08-15 MED ORDER — ONDANSETRON HCL 4 MG/2ML IJ SOLN: 4.0000 mg | Freq: Four times a day (QID) | INTRAMUSCULAR | Status: DC | PRN

## 2016-08-15 MED ORDER — SODIUM CHLORIDE 0.9% FLUSH: 3.0000 mL | Freq: Two times a day (BID) | INTRAVENOUS | Status: DC

## 2016-08-15 MED ORDER — HEPARIN (PORCINE) IN NACL 100-0.45 UNIT/ML-% IJ SOLN
1400.0000 [IU]/h | INTRAMUSCULAR | Status: DC
  Administered 2016-08-15: 1500 [IU]/h via INTRAVENOUS
  Filled 2016-08-15 (×3): qty 250

## 2016-08-15 MED ORDER — GABAPENTIN 300 MG PO CAPS
300.0000 mg | ORAL_CAPSULE | Freq: Three times a day (TID) | ORAL | Status: DC
  Administered 2016-08-15 – 2016-08-20 (×13): 300 mg via ORAL
  Filled 2016-08-15 (×13): qty 1

## 2016-08-15 MED ORDER — INSULIN ASPART 100 UNIT/ML ~~LOC~~ SOLN
0.0000 [IU] | Freq: Three times a day (TID) | SUBCUTANEOUS | Status: DC
  Administered 2016-08-16: 4 [IU] via SUBCUTANEOUS

## 2016-08-15 MED ORDER — ACETAMINOPHEN 325 MG PO TABS
650.0000 mg | ORAL_TABLET | ORAL | Status: DC | PRN
  Administered 2016-08-15 – 2016-08-19 (×4): 650 mg via ORAL
  Filled 2016-08-15 (×4): qty 2

## 2016-08-15 MED ORDER — SODIUM CHLORIDE 0.9 % IV SOLN
INTRAVENOUS | Status: DC
Start: 2016-08-16 — End: 2016-08-15

## 2016-08-15 MED ORDER — SODIUM CHLORIDE 0.9 % IV SOLN: INTRAVENOUS | Status: DC

## 2016-08-15 MED ORDER — POTASSIUM CHLORIDE CRYS ER 20 MEQ PO TBCR
20.0000 meq | EXTENDED_RELEASE_TABLET | Freq: Every day | ORAL | Status: DC
  Administered 2016-08-16 – 2016-08-20 (×5): 20 meq via ORAL
  Filled 2016-08-15 (×5): qty 1

## 2016-08-15 MED ORDER — LOSARTAN POTASSIUM 50 MG PO TABS
100.0000 mg | ORAL_TABLET | Freq: Every day | ORAL | Status: DC
  Administered 2016-08-16 – 2016-08-20 (×5): 100 mg via ORAL
  Filled 2016-08-15 (×6): qty 2

## 2016-08-15 MED ORDER — ASPIRIN EC 81 MG PO TBEC
81.0000 mg | DELAYED_RELEASE_TABLET | Freq: Every day | ORAL | Status: DC
  Administered 2016-08-16 – 2016-08-20 (×3): 81 mg via ORAL
  Filled 2016-08-15 (×3): qty 1

## 2016-08-15 MED ORDER — SODIUM CHLORIDE 0.9% FLUSH: 3.0000 mL | INTRAVENOUS | Status: DC | PRN

## 2016-08-15 MED ORDER — CLOPIDOGREL BISULFATE 75 MG PO TABS
75.0000 mg | ORAL_TABLET | Freq: Every day | ORAL | Status: DC
  Administered 2016-08-15 – 2016-08-20 (×6): 75 mg via ORAL
  Filled 2016-08-15 (×6): qty 1

## 2016-08-15 MED ORDER — NITROGLYCERIN 0.4 MG SL SUBL: 0.4000 mg | SUBLINGUAL_TABLET | SUBLINGUAL | Status: DC | PRN

## 2016-08-15 MED ORDER — HEPARIN BOLUS VIA INFUSION
4000.0000 [IU] | Freq: Once | INTRAVENOUS | Status: AC
  Administered 2016-08-15: 4000 [IU] via INTRAVENOUS
  Filled 2016-08-15: qty 4000

## 2016-08-15 MED ORDER — PIOGLITAZONE HCL 45 MG PO TABS
45.0000 mg | ORAL_TABLET | Freq: Every day | ORAL | Status: DC
  Administered 2016-08-16 – 2016-08-18 (×2): 45 mg via ORAL
  Filled 2016-08-15 (×5): qty 1

## 2016-08-15 MED ORDER — ASPIRIN 81 MG PO CHEW: 81.0000 mg | CHEWABLE_TABLET | ORAL | Status: DC

## 2016-08-15 MED ORDER — SODIUM CHLORIDE 0.9 % IV SOLN: 250.0000 mL | INTRAVENOUS | Status: DC | PRN

## 2016-08-15 MED ORDER — MOMETASONE FURO-FORMOTEROL FUM 100-5 MCG/ACT IN AERO: 2.0000 | INHALATION_SPRAY | Freq: Two times a day (BID) | RESPIRATORY_TRACT | Status: DC

## 2016-08-15 MED ORDER — FUROSEMIDE 10 MG/ML IJ SOLN
80.0000 mg | Freq: Every day | INTRAMUSCULAR | Status: DC
  Administered 2016-08-16 – 2016-08-20 (×4): 80 mg via INTRAVENOUS
  Filled 2016-08-15 (×4): qty 8

## 2016-08-15 MED ORDER — PRAVASTATIN SODIUM 40 MG PO TABS
40.0000 mg | ORAL_TABLET | Freq: Every day | ORAL | Status: DC
  Administered 2016-08-15 – 2016-08-16 (×2): 40 mg via ORAL
  Filled 2016-08-15 (×2): qty 1

## 2016-08-15 MED ORDER — INSULIN ASPART 100 UNIT/ML ~~LOC~~ SOLN
0.0000 [IU] | Freq: Every day | SUBCUTANEOUS | Status: DC
  Administered 2016-08-18: 2 [IU] via SUBCUTANEOUS
  Administered 2016-08-19: 23:00:00 6 [IU] via SUBCUTANEOUS

## 2016-08-15 MED ORDER — ASPIRIN 81 MG PO CHEW
81.0000 mg | CHEWABLE_TABLET | ORAL | Status: AC
  Administered 2016-08-17: 81 mg via ORAL

## 2016-08-15 MED ORDER — GLIMEPIRIDE 4 MG PO TABS
4.0000 mg | ORAL_TABLET | Freq: Two times a day (BID) | ORAL | Status: DC
Start: 2016-08-16 — End: 2016-08-17
  Administered 2016-08-16 (×2): 4 mg via ORAL
  Filled 2016-08-15 (×2): qty 1

## 2016-08-15 MED ORDER — SODIUM CHLORIDE 0.9 % IV SOLN
250.0000 mL | INTRAVENOUS | Status: DC | PRN
Start: 2016-08-15 — End: 2016-08-17

## 2016-08-15 MED ORDER — ASPIRIN 81 MG PO CHEW
243.0000 mg | CHEWABLE_TABLET | Freq: Once | ORAL | Status: AC
  Administered 2016-08-15: 243 mg via ORAL
  Filled 2016-08-15: qty 3

## 2016-08-15 MED ORDER — METOPROLOL TARTRATE 50 MG PO TABS
50.0000 mg | ORAL_TABLET | Freq: Two times a day (BID) | ORAL | Status: DC
  Administered 2016-08-15 – 2016-08-17 (×4): 50 mg via ORAL
  Filled 2016-08-15 (×4): qty 1

## 2016-08-15 NOTE — ED Notes (Signed)
Lab to add on BNP to blood work that has already been sent down.

## 2016-08-15 NOTE — Progress Notes (Signed)
ANTICOAGULATION CONSULT NOTE - Initial Consult  Pharmacy Consult for heparin  Indication: chest pain/ACS  Allergies  Allergen Reactions  . Xarelto [Rivaroxaban] Other (See Comments)    rash    Patient Measurements: Height: 6\' 4"  (193 cm) Weight: (!) 366 lb 12.8 oz (166.4 kg) IBW/kg (Calculated) : 86.8 Heparin Dosing Weight: 126kg  Vital Signs: Temp: 97.8 F (36.6 C) (09/03 1617) Temp Source: Oral (09/03 1617) BP: 160/59 (09/03 1617) Pulse Rate: 59 (09/03 1617)  Labs:  Recent Labs  08/15/16 1135  HGB 12.6*  HCT 42.1  PLT 236  CREATININE 0.91    Estimated Creatinine Clearance: 119.5 mL/min (by C-G formula based on SCr of 0.91 mg/dL).   Medical History: Past Medical History:  Diagnosis Date  . B12 deficiency   . CAD (coronary artery disease)    a. Approx. 2000 - MI. Cath showed single vessel disease, PTCA dLAD/medical management. ;  b. NSTEMI 11/13 => LHC: prox and mid LAD 30%, dLAD 60%, pCFX 30%, inf branch of OM 40%, mRCA 50-60%, EF 55-60%; IVUS attempted for RCA but not successful; anatomy felt stable from 2000 => med Rx.  . Cholelithiasis   . Claustrophobia   . CVA (cerebral vascular accident) ([place]) ~ 2002  . Diabetic neuropathy ([place])   . Diverticulitis   . ED (erectile dysfunction)   . Essential hypertension   . History of MRSA infection    Right foot  . History of stomach ulcers   . Hypercholesterolemia   . Morbid obesity ([place])   . OSA on CPAP   . PAF (paroxysmal atrial fibrillation) ([name])    a. confirmed by event monitor. b. 02/2014 rash on Coumadin, patient decided to discontinue Xarelto due to possible rash, cost and lawyers ads on TV, agreed to take Plavix.  [place] Plan vision    Since stroke  . Type II diabetes mellitus ([name])     Medications:  Prescriptions Prior to Admission  Medication Sig Dispense Refill Last Dose  . acetaminophen (TYLENOL) 325 MG tablet Take 2 tablets (650 mg total) by mouth every 6 (six) hours as needed for mild pain, fever  or headache (or Fever >/= 101).   08/14/2016 at Unknown time  . aspirin 81 MG tablet Take 81 mg by mouth daily.   08/15/2016 at Unknown time  . Cholecalciferol (EQL VITAMIN D3) 1000 UNITS tablet Take 1,000 Units by mouth daily.     08/15/2016 at Unknown time  . clopidogrel (PLAVIX) 75 MG tablet Take 1 tablet (75 mg total) by mouth daily. 90 tablet 3 08/14/2016 at Unknown time  . erythromycin ophthalmic ointment Place 1 application into the left eye 3 (three) times daily. 3.5 g 1 Past Week at Unknown time  . Fluticasone-Salmeterol (ADVAIR DISKUS) 100-50 MCG/DOSE AEPB Inhale 1 puff into the lungs 2 (two) times daily. (Patient taking differently: Inhale 1 puff into the lungs 2 (two) times daily as needed (for shortness of breath). ) 3 each 3 2-3 months ago at Unknown  . furosemide (LASIX) 80 MG tablet Take 1/2 tablet daily alternating with whole tablet daily (Patient taking differently: Take 40-80 mg by mouth See admin instructions. Take 1/2 tablet daily alternating with whole tablet daily) 90 tablet 3 Past Week at Unknown time  . gabapentin (NEURONTIN) 300 MG capsule TAKE 1 CAPSULE THREE TIMES DAILY AS NEEDED FOR NERVE PAIN (Patient taking differently: Take 300 mg by mouth 2 (two) times daily. ) 270 capsule 1 08/14/2016 at Unknown time  . glimepiride (AMARYL) 4 MG tablet  Take 1 tablet (4 mg total) by mouth 2 (two) times daily. 180 tablet 3 08/15/2016 at Unknown time  . insulin aspart (NOVOLOG FLEXPEN) 100 UNIT/ML FlexPen Use 5-20 units four times a day after meals and at bedtime Per sliding scale 100-120 cbg (Patient taking differently: Inject 3-12 Units into the skin See admin instructions. Use 3-12 units four times a day after meals and at bedtime as needed per sliding scale 100-120 cbg) 75 mL 3 08/14/2016 at Unknown time  . losartan (COZAAR) 100 MG tablet Take 1 tablet (100 mg total) by mouth daily. 90 tablet 3 08/15/2016 at Unknown time  . metoprolol (LOPRESSOR) 50 MG tablet Take 1 tablet (50 mg total) by mouth 2  (two) times daily. 180 tablet 3 08/15/2016 at 0830  . nitroGLYCERIN (NITROSTAT) 0.4 MG SL tablet Place 1 tablet (0.4 mg total) under the tongue every 5 (five) minutes as needed. For chest pain 20 tablet 2 08/15/2016 at Unknown time  . pioglitazone (ACTOS) 45 MG tablet Take 1 tablet (45 mg total) by mouth daily. 90 tablet 3 08/15/2016 at Unknown time  . potassium chloride SA (K-DUR,KLOR-CON) 20 MEQ tablet Take 2 tablets (40 mEq total) by mouth daily. (Patient taking differently: Take 20-40 mEq by mouth See admin instructions. ) 180 tablet 3 Past Week at Unknown time  . pravastatin (PRAVACHOL) 40 MG tablet Take 1 tablet (40 mg total) by mouth daily. 90 tablet 3 08/14/2016 at Unknown time  . silver sulfADIAZINE (SILVADENE) 1 % cream Apply 1 application topically daily. 50 g 2 Past Week at Unknown time  . triamcinolone cream (KENALOG) 0.5 % Apply 1 application topically 2 (two) times daily as needed (rash). 30 g 3 08/14/2016 at Unknown time  . vitamin B-12 (CYANOCOBALAMIN) 1000 MCG tablet Take 1,000 mcg by mouth daily.     08/15/2016 at Unknown time  . amoxicillin-clavulanate (AUGMENTIN) 875-125 MG tablet Take 1 tablet by mouth 2 (two) times daily. (Patient not taking: Reported on 08/15/2016) 20 tablet 0 Completed Course at Unknown time  . doxycycline (VIBRAMYCIN) 100 MG capsule Take 1 capsule (100 mg total) by mouth 2 (two) times daily. (Patient not taking: Reported on 08/15/2016) 20 capsule 0 Completed Course at Unknown time   Scheduled:  . [START ON 08/17/2016] aspirin  81 mg Oral Pre-Cath  . [START ON 08/16/2016] aspirin EC  81 mg Oral Daily  . clopidogrel  75 mg Oral Daily  . [START ON 08/16/2016] furosemide  80 mg Intravenous Daily  . gabapentin  300 mg Oral TID  . [START ON 08/16/2016] glimepiride  4 mg Oral BID WC  . [START ON 08/16/2016] insulin aspart  0-20 Units Subcutaneous TID WC  . insulin aspart  0-5 Units Subcutaneous QHS  . [START ON 08/16/2016] losartan  100 mg Oral Daily  . metoprolol  50 mg Oral BID  .  [START ON 08/16/2016] pioglitazone  45 mg Oral Daily  . [START ON 08/16/2016] potassium chloride SA  20 mEq Oral Daily  . pravastatin  40 mg Oral QHS  . sodium chloride flush  3 mL Intravenous Q12H  . sodium chloride flush  3 mL Intravenous Q12H    Assessment: 75 yo male here with CP and pharmacy consulted to dose heparin. He is noted with history of afib but has declined anticoagulation.   Goal of Therapy:  Heparin level 0.3-0.7 units/ml Monitor platelets by anticoagulation protocol: Yes   Plan:  Give 4000 units bolus x 1 Start heparin infusion at 1500 units/hr Check anti-Xa  level in 6 hours and daily while on heparin Continue to monitor H&H and platelets  Hildred Laser, Pharm D 08/15/2016 5:45 PM

## 2016-08-15 NOTE — ED Notes (Signed)
Patient transported to X-ray 

## 2016-08-15 NOTE — ED Provider Notes (Signed)
[facility] DEPT Provider Note   CSN: [id number] Arrival date & time: 08/15/16  1122     History   Chief Complaint Chief Complaint  Patient presents with  . Chest Pain    HPI Charles [name] is a 75 y.o. male.  75 year old male with extensive past medical history including CAD, CVA, type 2 diabetes mellitus who presents with chest pain. The patient states that for the last 1-2 weeks he has had intermittent episodes of central, nonradiating chest pressure. He has taken nitroglycerin previously which improves his symptoms. The pressure is not related to exertion. Over the past few days, the episodes of been happening more frequently. He initially thought that the episodes were related to asked only missing doses of metoprolol but he has had several episodes recently even when he was compliant with metoprolol. Last night, he had a more intense episode that was 5/10 in intensity. He denies any associated shortness of breath, cough/cold symptoms, fevers, diaphoresis, or vomiting. He does think that his weight has increased a Vaudine Dutan bit recently. No orthopnea or paroxysmal nocturnal dyspnea.   The history is provided by the patient.  Chest Pain      Past Medical History:  Diagnosis Date  . B12 deficiency   . CAD (coronary artery disease)    a. Approx. 2000 - MI. Cath showed single vessel disease, PTCA dLAD/medical management. ;  b. NSTEMI 11/13 => LHC: prox and mid LAD 30%, dLAD 60%, pCFX 30%, inf branch of OM 40%, mRCA 50-60%, EF 55-60%; IVUS attempted for RCA but not successful; anatomy felt stable from 2000 => med Rx.  . Cholelithiasis   . Claustrophobia   . CVA (cerebral vascular accident) ([place]) ~ 2002  . Diabetic neuropathy ([place])   . Diverticulitis   . ED (erectile dysfunction)   . Essential hypertension   . History of MRSA infection    Right foot  . History of stomach ulcers   . Hypercholesterolemia   . Morbid obesity ([place])   . OSA on CPAP   . PAF (paroxysmal atrial  fibrillation) ([name])    a. confirmed by event monitor. b. 02/2014 rash on Coumadin, patient decided to discontinue Xarelto due to possible rash, cost and lawyers ads on TV, agreed to take Plavix.  Fuller Plan vision    Since stroke  . Type II diabetes mellitus [facility])     Patient Active Problem List   Diagnosis Date Noted  . Conjunctivitis 06/02/2016  . 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 Pressure ulcer 12/27/2015  . Hypoglycemia 12/26/2015  . Gangrene of toe ([place]) 12/26/2015  . Dehydration 12/26/2015  . Acute renal failure ([place]) 12/26/2015  . Acute renal failure (ARF) ([place]) 12/26/2015  . Subconjunctival hemorrhage 10/21/2015  . Cellulitis in diabetic foot ([place])   . Cellulitis 05/06/2015  . COPD exacerbation ([place]) 10/10/2014  . Hematuria 08/12/2014  . Hypokalemia 08/12/2014  . Urinary retention 08/03/2014  . Hypotension 07/26/2014  . COPD  07/26/2014  . CAP (community acquired pneumonia) 07/25/2014  . Acute respiratory failure with hypoxia ([place]) 07/25/2014  . Septic shock ([place]) 07/25/2014  . Rash and nonspecific skin eruption 02/13/2014  . PAF (paroxysmal atrial fibrillation) ([place]) 11/13/2013  . Morbid obesity ([place]) 05/24/2012  . Type 2 diabetes, uncontrolled, with retinopathy ([place]) 02/09/2012  . NEOPLASM OF UNCERTAIN BEHAVIOR OF SKIN 12/24/2010  . WEIGHT GAIN, ABNORMAL 02/02/2010  . TOBACCO USE, QUIT 02/02/2010  . HYPOGONADISM 07/12/2007  . B12 deficiency 07/12/2007  . HYPERCHOLESTEROLEMIA, PURE 07/12/2007  . Essential hypertension  07/12/2007  . MYOCARDIAL INFARCTION, HX OF 07/12/2007  . Coronary atherosclerosis 07/12/2007  . History of CVA (cerebrovascular accident) 07/12/2007  . VENOUS INSUFFICIENCY, LEGS 07/12/2007  . SYMPTOM, MEMORY LOSS 07/12/2007  . DIVERTICULITIS, HX OF 07/12/2007    Past Surgical History:  Procedure Laterality Date  . CARDIAC CATHETERIZATION  1990's  . CEREBRAL ANGIOGRAM  ~ 2000  . LEFT HEART CATHETERIZATION WITH CORONARY ANGIOGRAM N/A  10/27/2012   Procedure: LEFT HEART CATHETERIZATION WITH CORONARY ANGIOGRAM;  Surgeon: Burnell Blanks, MD;  Location: [facility] CATH LAB;  Service: Cardiovascular;  Laterality: N/A;  . TOE AMPUTATION  2006; 2009   "Dr. Blenda Mounts; big toe left foot; Earnstine Meinders toe on right foot" (10/26/2012)  . TOE AMPUTATION Right 12/24/2015   2nd & 3 toes/notes 1/13//2017       Home Medications    Prior to Admission medications   Medication Sig Start Date End Date Taking? Authorizing Provider  acetaminophen (TYLENOL) 325 MG tablet Take 2 tablets (650 mg total) by mouth every 6 (six) hours as needed for mild pain, fever or headache (or Fever >/= 101). 08/06/14  Yes Modena Jansky, MD  aspirin 81 MG tablet Take 81 mg by mouth daily.   Yes Historical Provider, MD  Cholecalciferol (EQL VITAMIN D3) 1000 UNITS tablet Take 1,000 Units by mouth daily.     Yes Historical Provider, MD  clopidogrel (PLAVIX) 75 MG tablet Take 1 tablet (75 mg total) by mouth daily. 06/02/16  Yes Cassandria Anger, MD  erythromycin ophthalmic ointment Place 1 application into the left eye 3 (three) times daily. 06/02/16  Yes Evie Lacks Plotnikov, MD  Fluticasone-Salmeterol (ADVAIR DISKUS) 100-50 MCG/DOSE AEPB Inhale 1 puff into the lungs 2 (two) times daily. Patient taking differently: Inhale 1 puff into the lungs 2 (two) times daily as needed (for shortness of breath).  03/02/16  Yes Cassandria Anger, MD  furosemide (LASIX) 80 MG tablet Take 1/2 tablet daily alternating with whole tablet daily Patient taking differently: Take 40-80 mg by mouth See admin instructions. Take 1/2 tablet daily alternating with whole tablet daily 06/02/16  Yes Evie Lacks Plotnikov, MD  gabapentin (NEURONTIN) 300 MG capsule TAKE 1 CAPSULE THREE TIMES DAILY AS NEEDED FOR NERVE PAIN Patient taking differently: Take 300 mg by mouth 2 (two) times daily.  06/02/16  Yes Evie Lacks Plotnikov, MD  glimepiride (AMARYL) 4 MG tablet Take 1 tablet (4 mg total) by mouth 2 (two)  times daily. 06/02/16  Yes Evie Lacks Plotnikov, MD  insulin aspart (NOVOLOG FLEXPEN) 100 UNIT/ML FlexPen Use 5-20 units four times a day after meals and at bedtime Per sliding scale 100-120 cbg Patient taking differently: Inject 3-12 Units into the skin See admin instructions. Use 3-12 units four times a day after meals and at bedtime as needed per sliding scale 100-120 cbg 03/12/16  Yes Evie Lacks Plotnikov, MD  losartan (COZAAR) 100 MG tablet Take 1 tablet (100 mg total) by mouth daily. 06/02/16  Yes Evie Lacks Plotnikov, MD  metoprolol (LOPRESSOR) 50 MG tablet Take 1 tablet (50 mg total) by mouth 2 (two) times daily. 06/02/16  Yes Evie Lacks Plotnikov, MD  nitroGLYCERIN (NITROSTAT) 0.4 MG SL tablet Place 1 tablet (0.4 mg total) under the tongue every 5 (five) minutes as needed. For chest pain 03/02/16  Yes Evie Lacks Plotnikov, MD  pioglitazone (ACTOS) 45 MG tablet Take 1 tablet (45 mg total) by mouth daily. 06/02/16  Yes Evie Lacks Plotnikov, MD  potassium chloride SA (K-DUR,KLOR-CON) 20 MEQ tablet  Take 2 tablets (40 mEq total) by mouth daily. Patient taking differently: Take 20-40 mEq by mouth See admin instructions.  06/02/16  Yes Evie Lacks Plotnikov, MD  pravastatin (PRAVACHOL) 40 MG tablet Take 1 tablet (40 mg total) by mouth daily. 06/02/16  Yes Evie Lacks Plotnikov, MD  silver sulfADIAZINE (SILVADENE) 1 % cream Apply 1 application topically daily. 08/27/14  Yes Harriet Masson, DPM  triamcinolone cream (KENALOG) 0.5 % Apply 1 application topically 2 (two) times daily as needed (rash). 04/18/15  Yes Evie Lacks Plotnikov, MD  vitamin B-12 (CYANOCOBALAMIN) 1000 MCG tablet Take 1,000 mcg by mouth daily.     Yes Historical Provider, MD  amoxicillin-clavulanate (AUGMENTIN) 875-125 MG tablet Take 1 tablet by mouth 2 (two) times daily. Patient not taking: Reported on 08/15/2016 07/19/16   Trula Slade, DPM  doxycycline (VIBRAMYCIN) 100 MG capsule Take 1 capsule (100 mg total) by mouth 2 (two) times daily. Patient  not taking: Reported on 08/15/2016 06/11/16   Trula Slade, DPM    Family History Family History  Problem Relation Age of Onset  . Breast cancer Mother 29  . Heart disease Father 40  . Asthma Neg Hx     Social History Social History  Substance Use Topics  . Smoking status: Former Smoker    Packs/day: 1.00    Years: 30.00    Types: Cigarettes, Cigars  . Smokeless tobacco: Current User    Types: Snuff     Comment: 10/26/2012 "quit smoking cigarettes  in ~ 1996; wife smokes in the house"  . Alcohol use Yes     Comment: 10/26/2012 "have a drink maybe twice/yr"     Allergies   Xarelto [rivaroxaban]   Review of Systems Review of Systems  Cardiovascular: Positive for chest pain.   10 Systems reviewed and are negative for acute change except as noted in the HPI.  Physical Exam Updated Vital Signs BP (!) 104/39   Pulse (!) 46   Temp 97.7 F (36.5 C) (Oral)   Resp 12   SpO2 100%   Physical Exam  Constitutional: He is oriented to person, place, and time. He appears well-developed and well-nourished. No distress.  HENT:  Head: Normocephalic and atraumatic.  Moist mucous membranes  Eyes: Conjunctivae are normal. Pupils are equal, round, and reactive to light.  Neck: Neck supple.  Cardiovascular: Normal rate and regular rhythm.   Murmur heard.  Systolic murmur is present with a grade of 2/6  Pulmonary/Chest: Effort normal and breath sounds normal.  Abdominal: Soft. Bowel sounds are normal. He exhibits no distension. There is no tenderness.  Musculoskeletal: He exhibits edema (2+ pitting edema BLE).  Neurological: He is alert and oriented to person, place, and time.  Fluent speech  Skin: Skin is warm and dry.  Psychiatric: He has a normal mood and affect. Judgment normal.  Nursing note and vitals reviewed.    ED Treatments / Results  Labs (all labs ordered are listed, but only abnormal results are displayed) Labs Reviewed  BASIC METABOLIC PANEL - Abnormal;  Notable for the following:       Result Value   Glucose, Bld 130 (*)    Anion gap 3 (*)    All other components within normal limits  CBC - Abnormal; Notable for the following:    RBC 4.18 (*)    Hemoglobin 12.6 (*)    MCV 100.7 (*)    MCHC 29.9 (*)    All other components within normal limits  BRAIN NATRIURETIC  PEPTIDE - Abnormal; Notable for the following:    B Natriuretic Peptide 336.5 (*)    All other components within normal limits  I-STAT TROPOININ, ED    EKG  EKG Interpretation  Date/Time:  Sunday August 15 2016 11:26:53 EDT Ventricular Rate:  59 PR Interval:  164 QRS Duration: 96 QT Interval:  448 QTC Calculation: 443 R Axis:   74 Text Interpretation:  Sinus bradycardia with occasional Premature ventricular complexes Low voltage QRS Cannot rule out Anterior infarct , age undetermined Abnormal ECG since previous tracing, rate has slowed occasional PVC Confirmed by Shaheem Pichon MD, [place] [phone]) on 08/15/2016 1:35:38 PM       Radiology Dg Chest 2 View  Result Date: 08/15/2016 CLINICAL DATA:  Congestion for 2 weeks EXAM: CHEST  2 VIEW COMPARISON:  December 26, 2015 FINDINGS: The mediastinal contour is normal. The heart size is mildly enlarged. There are bilateral pleural effusions. Mild increased pulmonary interstitium is identified bilaterally. The bony structures are stable. IMPRESSION: Mild interstitial edema.  Small bilateral pleural effusions. Electronically Signed   By: Abelardo Diesel M.D.   On: 08/15/2016 12:26    Procedures Procedures (including critical care time)  Medications Ordered in ED Medications  aspirin chewable tablet 243 mg (not administered)     Initial Impression / Assessment and Plan / ED Course  I have reviewed the triage vital signs and the nursing notes.  Pertinent labs & imaging results that were available during my care of the patient were reviewed by me and considered in my medical decision making (see chart for details).  Clinical Course     PT w/ cardiac hx above p/w intermittent chest tightness usually responsive to NTG but which has been happening more frequently recently. He was comfortable on exam denying pain. BLE edema on exam. Gave aspirin and obtained labs which show negative initial trop. Chest x-ray shows mild interstitial edema. BNP 336. EKG without ischemic changes. I'm concerned about the patient's history as it suggests unstable angina. I discussed patient with cardiology, Dr. Domenic Polite, and pt will be admitted to cardiology for further care.  Final Clinical Impressions(s) / ED Diagnoses   Final diagnoses:  None    New Prescriptions New Prescriptions   No medications on file     Sharlett Iles, MD 08/15/16 1656

## 2016-08-15 NOTE — ED Triage Notes (Signed)
Pt c/o 3 day history of intermittent L side CP.  He reports some SOB, dizziness. He denies cough, fevers, weakness.The pain is relieved by nitro and sleeping. He is alert and breathing easily.

## 2016-08-15 NOTE — H&P (Signed)
Primary cardiologist: Dr. Dorris Carnes  Reason for admission: Progressive chest pain  Clinical Summary Charles [name] is a 75 y.o.male with past medical history outlined below, now presenting to the [facility] ER with increasing episodes of chest pressure. He states that this has been going on over the last week, thought it might have been related to missing a dose of Lopressor, however even when he is taking his medications consistently he has been having escalating symptoms. Overnight he started developing left arm discomfort as well. He has been using nitroglycerin for the first time in several years, on a few occasions over this last week. Symptoms feel like his previous angina.  He has a history of CAD as outlined below, most recent cardiac catheterization was in 2013 at which time he was managed medically. He has not had interval ischemic testing.  He has a history of PAF, previously on Coumadin and then Xarelto, however subsequently declined anticoagulation and is been taking aspirin and Plavix. His CHADSVASC score is 6.  ECG does not show acute ST segment changes and his initial troponin I level was negative.   Allergies  Allergen Reactions  . Xarelto [Rivaroxaban] Other (See Comments)    rash    Home Medications No current facility-administered medications on file prior to encounter.    Current Outpatient Prescriptions on File Prior to Encounter  Medication Sig Dispense Refill  . acetaminophen (TYLENOL) 325 MG tablet Take 2 tablets (650 mg total) by mouth every 6 (six) hours as needed for mild pain, fever or headache (or Fever >/= 101).    Marland Kitchen aspirin 81 MG tablet Take 81 mg by mouth daily.    . Cholecalciferol (EQL VITAMIN D3) 1000 UNITS tablet Take 1,000 Units by mouth daily.      . clopidogrel (PLAVIX) 75 MG tablet Take 1 tablet (75 mg total) by mouth daily. 90 tablet 3  . erythromycin ophthalmic ointment Place 1 application into the left eye 3 (three) times daily. 3.5 g 1  .  Fluticasone-Salmeterol (ADVAIR DISKUS) 100-50 MCG/DOSE AEPB Inhale 1 puff into the lungs 2 (two) times daily. (Patient taking differently: Inhale 1 puff into the lungs 2 (two) times daily as needed (for shortness of breath). ) 3 each 3  . furosemide (LASIX) 80 MG tablet Take 1/2 tablet daily alternating with whole tablet daily (Patient taking differently: Take 40-80 mg by mouth See admin instructions. Take 1/2 tablet daily alternating with whole tablet daily) 90 tablet 3  . gabapentin (NEURONTIN) 300 MG capsule TAKE 1 CAPSULE THREE TIMES DAILY AS NEEDED FOR NERVE PAIN (Patient taking differently: Take 300 mg by mouth 2 (two) times daily. ) 270 capsule 1  . glimepiride (AMARYL) 4 MG tablet Take 1 tablet (4 mg total) by mouth 2 (two) times daily. 180 tablet 3  . insulin aspart (NOVOLOG FLEXPEN) 100 UNIT/ML FlexPen Use 5-20 units four times a day after meals and at bedtime Per sliding scale 100-120 cbg (Patient taking differently: Inject 3-12 Units into the skin See admin instructions. Use 3-12 units four times a day after meals and at bedtime as needed per sliding scale 100-120 cbg) 75 mL 3  . losartan (COZAAR) 100 MG tablet Take 1 tablet (100 mg total) by mouth daily. 90 tablet 3  . metoprolol (LOPRESSOR) 50 MG tablet Take 1 tablet (50 mg total) by mouth 2 (two) times daily. 180 tablet 3  . nitroGLYCERIN (NITROSTAT) 0.4 MG SL tablet Place 1 tablet (0.4 mg total) under the tongue every  5 (five) minutes as needed. For chest pain 20 tablet 2  . pioglitazone (ACTOS) 45 MG tablet Take 1 tablet (45 mg total) by mouth daily. 90 tablet 3  . potassium chloride SA (K-DUR,KLOR-CON) 20 MEQ tablet Take 2 tablets (40 mEq total) by mouth daily. (Patient taking differently: Take 20-40 mEq by mouth See admin instructions. ) 180 tablet 3  . pravastatin (PRAVACHOL) 40 MG tablet Take 1 tablet (40 mg total) by mouth daily. 90 tablet 3  . silver sulfADIAZINE (SILVADENE) 1 % cream Apply 1 application topically daily. 50 g 2  .  triamcinolone cream (KENALOG) 0.5 % Apply 1 application topically 2 (two) times daily as needed (rash). 30 g 3  . vitamin B-12 (CYANOCOBALAMIN) 1000 MCG tablet Take 1,000 mcg by mouth daily.      Marland Kitchen amoxicillin-clavulanate (AUGMENTIN) 875-125 MG tablet Take 1 tablet by mouth 2 (two) times daily. (Patient not taking: Reported on 08/15/2016) 20 tablet 0  . doxycycline (VIBRAMYCIN) 100 MG capsule Take 1 capsule (100 mg total) by mouth 2 (two) times daily. (Patient not taking: Reported on 08/15/2016) 20 capsule 0    Past Medical History:  Diagnosis Date  . B12 deficiency   . CAD (coronary artery disease)    a. Approx. 2000 - MI. Cath showed single vessel disease, PTCA dLAD/medical management. ;  b. NSTEMI 11/13 => LHC: prox and mid LAD 30%, dLAD 60%, pCFX 30%, inf branch of OM 40%, mRCA 50-60%, EF 55-60%; IVUS attempted for RCA but not successful; anatomy felt stable from 2000 => med Rx.  . Cholelithiasis   . Claustrophobia   . CVA (cerebral vascular accident) ([place]) ~ 2002  . Diabetic neuropathy ([place])   . Diverticulitis   . ED (erectile dysfunction)   . Essential hypertension   . History of MRSA infection    Right foot  . History of stomach ulcers   . Hypercholesterolemia   . Morbid obesity ([place])   . OSA on CPAP   . PAF (paroxysmal atrial fibrillation) ([name])    a. confirmed by event monitor. b. 02/2014 rash on Coumadin, patient decided to discontinue Xarelto due to possible rash, cost and lawyers ads on TV, agreed to take Plavix.  Fuller Plan vision    Since stroke  . Type II diabetes mellitus ([place])     Past Surgical History:  Procedure Laterality Date  . CARDIAC CATHETERIZATION  1990's  . CEREBRAL ANGIOGRAM  ~ 2000  . LEFT HEART CATHETERIZATION WITH CORONARY ANGIOGRAM N/A 10/27/2012   Procedure: LEFT HEART CATHETERIZATION WITH CORONARY ANGIOGRAM;  Surgeon: Burnell Blanks, MD;  Location: [facility] CATH LAB;  Service: Cardiovascular;  Laterality: N/A;  . TOE AMPUTATION  2006; 2009   "Dr.  Blenda Mounts; big toe left foot; little toe on right foot" (10/26/2012)  . TOE AMPUTATION Right 12/24/2015   2nd & 3 toes/notes 1/13//2017    Family History  Problem Relation Age of Onset  . Breast cancer Mother 50  . Heart disease Father 64  . Asthma Neg Hx     Social History Mr. Gionfriddo reports that he has quit smoking. His smoking use included Cigarettes and Cigars. He has a 30.00 pack-year smoking history. His smokeless tobacco use includes Snuff. Mr. Kahler reports that he drinks alcohol.  Review of Systems Complete review of systems negative except as otherwise outlined in the clinical summary and also the following. Decreased hearing. Chronic leg swelling and rash.  Physical Examination Temp:  [97.7 F (36.5 C)] 97.7 F (36.5 C) (09/03  1132) Pulse Rate:  [46-57] 46 (09/03 1345) Resp:  [12-18] 12 (09/03 1345) BP: (104-147)/(39-74) 104/39 (09/03 1345) SpO2:  [100 %] 100 % (09/03 1345) No intake or output data in the 24 hours ending 08/15/16 1445  Telemetry: Sinus rhythm and sinus bradycardia.  Gen.: Morbidly obese male in no distress. HEENT: Conjunctiva and lids normal, oropharynx clear with poor dentition. Neck: Supple, increased girth, difficult to assess JVP, no thyromegaly. Lungs: Clear to auscultation, nonlabored breathing at rest. Cardiac: Regular rate and rhythm, no S3 or significant systolic murmur, no pericardial rub. Abdomen: Morbidly obese, nontender, bowel sounds present, no guarding or rebound. Extremities: Chronic appearing edema, 2+ with venous stasis edema, distal pulses 1-2+. Skin: Warm and dry. Musculoskeletal: No kyphosis. Neuropsychiatric: Alert and oriented x3, affect grossly appropriate.   Lab Results  Basic Metabolic Panel:  Recent Labs Lab 08/15/16 1135  NA 139  K 4.7  CL 107  CO2 29  GLUCOSE 130*  BUN 13  CREATININE 0.91  CALCIUM 9.2    CBC:  Recent Labs Lab 08/15/16 1135  WBC 6.4  HGB 12.6*  HCT 42.1  MCV 100.7*  PLT 236     Cardiac Enzymes: POC troponin I 0.04  BNP: 336  ECG Tracing today shows sinus bradycardia with PVC, low voltage, and decreased R wave progression.  Imaging FINDINGS: The mediastinal contour is normal. The heart size is mildly enlarged. There are bilateral pleural effusions. Mild increased pulmonary interstitium is identified bilaterally. The bony structures are stable.  IMPRESSION: Mild interstitial edema.  Small bilateral pleural effusions.  Impression  1. Symptoms concerning for unstable angina, onset over the last week. Initial troponin I is negative and ECG does not show acute ST segment changes.  2. CAD status post angioplasty of the LAD in 2000, NSTEMI in 2013 that was managed medically. At that time he did have a distal 60% LAD stenosis, mid 50-60% RCA stenosis and other mild to moderate disease.  3. Chronic leg edema and venous stasis. On diuretics at home. Echocardiogram from 2015 showed LVEF 55-60%.  4. Paroxysmal atrial fibrillation with CHADSVASC score of 6. He declines anticoagulation as outlined above.  5. Morbid obesity.  6. Type 2 diabetes mellitus with neuropathy.  7. Hyperlipidemia, on Pravachol.   Recommendations  Discussed with patient and wife. Recommended admission for further evaluation. He will be started on heparin in addition to his home medications. Sliding scale insulin coverage for now. Check hemoglobin A1c and follow-up lipid panel. Cycle set of cardiac markers. Echocardiogram for reassessment of cardiac structure and function. Convert to IV Lasix temporarily. Ultimately anticipate cardiac catheterization to evaluate coronary anatomy for revascularization options.  Satira Sark, M.D., F.A.C.C.

## 2016-08-15 NOTE — Progress Notes (Signed)
CPAP set up at patient's bedside. Home settings input and mask fitted. Patient's wife states that she will place it on. RT will monitor as needed.

## 2016-08-16 ENCOUNTER — Other Ambulatory Visit (HOSPITAL_COMMUNITY): Payer: Medicare Other

## 2016-08-16 ENCOUNTER — Encounter (HOSPITAL_COMMUNITY): Payer: Self-pay | Admitting: General Practice

## 2016-08-16 LAB — CBC
HEMATOCRIT: 37.7 % — AB (ref 39.0–52.0)
HEMOGLOBIN: 11.4 g/dL — AB (ref 13.0–17.0)
MCH: 29.9 pg (ref 26.0–34.0)
MCHC: 30.2 g/dL (ref 30.0–36.0)
MCV: 99 fL (ref 78.0–100.0)
Platelets: 191 10*3/uL (ref 150–400)
RBC: 3.81 MIL/uL — ABNORMAL LOW (ref 4.22–5.81)
RDW: 15.3 % (ref 11.5–15.5)
WBC: 5.4 10*3/uL (ref 4.0–10.5)

## 2016-08-16 LAB — HEPARIN LEVEL (UNFRACTIONATED)
HEPARIN UNFRACTIONATED: 0.62 [IU]/mL (ref 0.30–0.70)
Heparin Unfractionated: 0.5 IU/mL (ref 0.30–0.70)
Heparin Unfractionated: 0.71 IU/mL — ABNORMAL HIGH (ref 0.30–0.70)
Heparin Unfractionated: <0.1 IU/mL — ABNORMAL LOW (ref 0.30–0.70)

## 2016-08-16 LAB — BASIC METABOLIC PANEL
ANION GAP: 4 — AB (ref 5–15)
BUN: 12 mg/dL (ref 6–20)
CHLORIDE: 107 mmol/L (ref 101–111)
CO2: 27 mmol/L (ref 22–32)
Calcium: 8.6 mg/dL — ABNORMAL LOW (ref 8.9–10.3)
Creatinine, Ser: 0.85 mg/dL (ref 0.61–1.24)
GFR calc non Af Amer: >60 mL/min (ref >60)
Glucose, Bld: 107 mg/dL — ABNORMAL HIGH (ref 65–99)
Potassium: 3.9 mmol/L (ref 3.5–5.1)
Sodium: 138 mmol/L (ref 135–145)

## 2016-08-16 LAB — GLUCOSE, CAPILLARY
GLUCOSE-CAPILLARY: 104 mg/dL — AB (ref 65–99)
GLUCOSE-CAPILLARY: 154 mg/dL — AB (ref 65–99)
Glucose-Capillary: 102 mg/dL — ABNORMAL HIGH (ref 65–99)
Glucose-Capillary: 129 mg/dL — ABNORMAL HIGH (ref 65–99)

## 2016-08-16 LAB — LIPID PANEL
CHOLESTEROL: 98 mg/dL (ref 0–200)
HDL: 33 mg/dL — ABNORMAL LOW (ref >40)
LDL CALC: 55 mg/dL (ref 0–99)
TRIGLYCERIDES: 49 mg/dL (ref <150)
Total CHOL/HDL Ratio: 3 RATIO
VLDL: 10 mg/dL (ref 0–40)

## 2016-08-16 LAB — TROPONIN I
Troponin I: 0.04 ng/mL (ref <0.03)
Troponin I: 0.04 ng/mL (ref <0.03)

## 2016-08-16 MED ORDER — SODIUM CHLORIDE 0.9 % IV SOLN: 250.0000 mL | INTRAVENOUS | Status: DC | PRN

## 2016-08-16 MED ORDER — HEPARIN BOLUS VIA INFUSION
3000.0000 [IU] | Freq: Once | INTRAVENOUS | Status: DC
  Filled 2016-08-16: qty 3000

## 2016-08-16 MED ORDER — SODIUM CHLORIDE 0.9 % IV SOLN
INTRAVENOUS | Status: DC
  Administered 2016-08-17: 05:00:00 via INTRAVENOUS

## 2016-08-16 MED ORDER — SODIUM CHLORIDE 0.9% FLUSH: 3.0000 mL | Freq: Two times a day (BID) | INTRAVENOUS | Status: DC

## 2016-08-16 MED ORDER — SODIUM CHLORIDE 0.9% FLUSH: 3.0000 mL | INTRAVENOUS | Status: DC | PRN

## 2016-08-16 NOTE — Progress Notes (Signed)
ANTICOAGULATION CONSULT NOTE - Follow Up Consult  Pharmacy Consult for Heparin  Indication: chest pain/ACS  Allergies  Allergen Reactions  . Xarelto [Rivaroxaban] Other (See Comments)    rash    Patient Measurements: Height: 6\' 4"  (193 cm) Weight: (!) 366 lb 12.8 oz (166.4 kg) IBW/kg (Calculated) : 86.8  Vital Signs: Temp: 98.4 F (36.9 C) (09/03 2357) Temp Source: Oral (09/03 2357) BP: 105/42 (09/03 2357) Pulse Rate: 56 (09/03 2357)  Labs:  Recent Labs  08/15/16 1135 08/15/16 1806 08/15/16 2300 08/15/16 2344 08/16/16 0448 08/16/16 0449  HGB 12.6*  --  11.4*  --   --   --   HCT 42.1  --  37.7*  --   --   --   PLT 236  --  191  --   --   --   LABPROT  --  14.3  --   --   --   --   INR  --  1.11  --   --   --   --   HEPARINUNFRC  --   --   --  0.50  --  <0.10*  CREATININE 0.91  --   --   --  0.85  --   TROPONINI  --  0.03*  --  0.04* 0.04*  --     Estimated Creatinine Clearance: 127.9 mL/min (by C-G formula based on SCr of 0.85 mg/dL).   Assessment: Heparin for CP, heparin level undetectable this AM, infusing ok per RN  Goal of Therapy:  Heparin level 0.3-0.7 units/ml Monitor platelets by anticoagulation protocol: Yes   Plan: -Heparin 3000 units BOLUS -Inc heparin drip to 1800 units/hr -1400 HL  Ciel Yanes, Dayn 08/16/2016,5:54 AM

## 2016-08-16 NOTE — Progress Notes (Signed)
ANTICOAGULATION CONSULT NOTE - Follow Up Consult  Pharmacy Consult for Heparin  Indication: chest pain/ACS  Allergies  Allergen Reactions  . Xarelto [Rivaroxaban] Other (See Comments)    rash    Patient Measurements: Height: 6\' 4"  (193 cm) Weight: (!) 361 lb 8 oz (164 kg) IBW/kg (Calculated) : 86.8 Heparin Dosing Weight: 126 kg   Vital Signs: Temp: 98 F (36.7 C) (09/04 1124) Temp Source: Oral (09/04 1124) BP: 108/40 (09/04 1124) Pulse Rate: 58 (09/04 1124)  Labs:  Recent Labs  08/15/16 1135 08/15/16 1806 08/15/16 2300  08/15/16 2344 08/16/16 0448 08/16/16 0449 08/16/16 0750 08/16/16 1441  HGB 12.6*  --  11.4*  --   --   --   --   --   --   HCT 42.1  --  37.7*  --   --   --   --   --   --   PLT 236  --  191  --   --   --   --   --   --   LABPROT  --  14.3  --   --   --   --   --   --   --   INR  --  1.11  --   --   --   --   --   --   --   HEPARINUNFRC  --   --   --   < > 0.50  --  <0.10* 0.71* 0.62  CREATININE 0.91  --   --   --   --  0.85  --   --   --   TROPONINI  --  0.03*  --   --  0.04* 0.04*  --   --   --   < > = values in this interval not displayed.  Estimated Creatinine Clearance: 126.9 mL/min (by C-G formula based on SCr of 0.85 mg/dL).   Medications:  Infusions:  . heparin 1,400 Units/hr (08/16/16 0902)    Assessment: JP is a 75 yo male who was admitted on 08/15/2016 for chest pain. Pharmacy has been asked to dose and monitor heparin.   After suspected lab error this morning, a heparin level was elevated and infusion required rate decrease to 1400 units/hr. Heparin level now in therapeutic range at 0.61 units/mL. No bleeding noted. Hemoglobin and platelets remain stable.  Left heart catheterization planned for tomorrow.   Goal of Therapy:  Heparin level 0.3-0.7 units/ml Monitor platelets by anticoagulation protocol: Yes   Plan:  Continue heparin at 1400 units/hr Monitor daily heparin level, CBC, and s/sx's of bleeding Follow-up  post-catheterization tomorrow   Ander Purpura D. Elishia Kaczorowski, PharmD, BCPS Clinical Pharmacist Pager: [phone] 08/16/2016 4:00 PM

## 2016-08-16 NOTE — Plan of Care (Signed)
Problem: Consults Goal: Cardiac Cath Patient Education (See Patient Education module for education specifics.)  Outcome: Completed/Met Date Met: 08/16/16 Pt received information regarding cardiac cath   Problem: Education: Goal: Knowledge of [facility] Education information/materials will improve Outcome: Completed/Met Date Met: 08/16/16 Pt received information during hospitalization regarding tests, procedures labs, medications, and available resources during this admission   Problem: Safety: Goal: Ability to remain free from injury will improve Outcome: Completed/Met Date Met: 08/16/16 Pt remains free from injury  Problem: Physical Regulation: Goal: Will remain free from infection Outcome: Completed/Met Date Met: 08/16/16 Pt is remaining free from infection   Problem: Fluid Volume: Goal: Ability to maintain a balanced intake and output will improve Outcome: Completed/Met Date Met: 08/16/16 Pt has adequate intake and output

## 2016-08-16 NOTE — Progress Notes (Signed)
Placed patient on CPAP without complication. RT will continue to monitor as needed. 

## 2016-08-16 NOTE — Progress Notes (Signed)
Risk and benefit of procedure explained to the patient who display clear understanding and agree to proceed.  Discussed with patient possible procedural risk include bleeding, vascular injury, renal injury, arrythmia, MI, stroke and loss of limb or life.  Signed, Kadarious Dikes PA Pager: [phone]  

## 2016-08-16 NOTE — Progress Notes (Signed)
Subjective: No CP  Breathing is OK   Objective: Vitals:   08/15/16 2230 08/15/16 2357 08/16/16 0500 08/16/16 0738  BP:  (!) 105/42 (!) 99/46 (!) 115/49  Pulse:  (!) 56 (!) 53 (!) 57  Resp:  15 14 20   Temp:  98.4 F (36.9 C) 98.3 F (36.8 C) 98.1 F (36.7 C)  TempSrc:  Oral Oral Oral  SpO2: 98% 96% 94% 95%  Weight:   (!) 361 lb 8 oz (164 kg)   Height:       Weight change:   Intake/Output Summary (Last 24 hours) at 08/16/16 [id number] Last data filed at 08/16/16 0400  Gross per 24 hour  Intake            324.5 ml  Output              475 ml  Net           -150.5 ml    General: Alert, awake, oriented x3, in no acute distress Neck:  JVP is difficult to assess with beard   Heart: Regular rate and rhythm, without murmurs, rubs, gallops.  Lungs: Clear to auscultation.  No rales or wheezes. Exemities:  1+ edema.  Erythema to legs   Neuro: Grossly intact, nonfocal.  Tele  SR    Lab Results: Results for orders placed or performed during the hospital encounter of 08/15/16 (from the past 24 hour(s))  Basic metabolic panel     Status: Abnormal   Collection Time: 08/15/16 11:35 AM  Result Value Ref Range   Sodium 139 135 - 145 mmol/L   Potassium 4.7 3.5 - 5.1 mmol/L   Chloride 107 101 - 111 mmol/L   CO2 29 22 - 32 mmol/L   Glucose, Bld 130 (H) 65 - 99 mg/dL   BUN 13 6 - 20 mg/dL   Creatinine, Ser 0.91 0.61 - 1.24 mg/dL   Calcium 9.2 8.9 - 10.3 mg/dL   GFR calc non Af Amer >60 >60 mL/min   GFR calc Af Amer >60 >60 mL/min   Anion gap 3 (L) 5 - 15  CBC     Status: Abnormal   Collection Time: 08/15/16 11:35 AM  Result Value Ref Range   WBC 6.4 4.0 - 10.5 K/uL   RBC 4.18 (L) 4.22 - 5.81 MIL/uL   Hemoglobin 12.6 (L) 13.0 - 17.0 g/dL   HCT 42.1 39.0 - 52.0 %   MCV 100.7 (H) 78.0 - 100.0 fL   MCH 30.1 26.0 - 34.0 pg   MCHC 29.9 (L) 30.0 - 36.0 g/dL   RDW 15.4 11.5 - 15.5 %   Platelets 236 150 - 400 K/uL  Brain natriuretic peptide     Status: Abnormal   Collection Time:  08/15/16 11:35 AM  Result Value Ref Range   B Natriuretic Peptide 336.5 (H) 0.0 - 100.0 pg/mL  I-stat troponin, ED     Status: None   Collection Time: 08/15/16 11:51 AM  Result Value Ref Range   Troponin i, poc 0.04 0.00 - 0.08 ng/mL   Comment 3          Glucose, capillary     Status: None   Collection Time: 08/15/16  4:32 PM  Result Value Ref Range   Glucose-Capillary 99 65 - 99 mg/dL  Troponin I     Status: Abnormal   Collection Time: 08/15/16  6:06 PM  Result Value Ref Range   Troponin I 0.03 (HH) <0.03 ng/mL  Protime-INR  Status: None   Collection Time: 08/15/16  6:06 PM  Result Value Ref Range   Prothrombin Time 14.3 11.4 - 15.2 seconds   INR 1.11   Glucose, capillary     Status: Abnormal   Collection Time: 08/15/16  9:29 PM  Result Value Ref Range   Glucose-Capillary 138 (H) 65 - 99 mg/dL  CBC     Status: Abnormal   Collection Time: 08/15/16 11:00 PM  Result Value Ref Range   WBC 5.4 4.0 - 10.5 K/uL   RBC 3.81 (L) 4.22 - 5.81 MIL/uL   Hemoglobin 11.4 (L) 13.0 - 17.0 g/dL   HCT 37.7 (L) 39.0 - 52.0 %   MCV 99.0 78.0 - 100.0 fL   MCH 29.9 26.0 - 34.0 pg   MCHC 30.2 30.0 - 36.0 g/dL   RDW 15.3 11.5 - 15.5 %   Platelets 191 150 - 400 K/uL  Troponin I     Status: Abnormal   Collection Time: 08/15/16 11:44 PM  Result Value Ref Range   Troponin I 0.04 (HH) <0.03 ng/mL  Heparin level (unfractionated)     Status: None   Collection Time: 08/15/16 11:44 PM  Result Value Ref Range   Heparin Unfractionated 0.50 0.30 - 0.70 IU/mL  Troponin I     Status: Abnormal   Collection Time: 08/16/16  4:48 AM  Result Value Ref Range   Troponin I 0.04 (HH) <0.03 ng/mL  Basic metabolic panel     Status: Abnormal   Collection Time: 08/16/16  4:48 AM  Result Value Ref Range   Sodium 138 135 - 145 mmol/L   Potassium 3.9 3.5 - 5.1 mmol/L   Chloride 107 101 - 111 mmol/L   CO2 27 22 - 32 mmol/L   Glucose, Bld 107 (H) 65 - 99 mg/dL   BUN 12 6 - 20 mg/dL   Creatinine, Ser 0.85 0.61 -  1.24 mg/dL   Calcium 8.6 (L) 8.9 - 10.3 mg/dL   GFR calc non Af Amer >60 >60 mL/min   GFR calc Af Amer >60 >60 mL/min   Anion gap 4 (L) 5 - 15  Lipid panel     Status: Abnormal   Collection Time: 08/16/16  4:48 AM  Result Value Ref Range   Cholesterol 98 0 - 200 mg/dL   Triglycerides 49 <150 mg/dL   HDL 33 (L) >40 mg/dL   Total CHOL/HDL Ratio 3.0 RATIO   VLDL 10 0 - 40 mg/dL   LDL Cholesterol 55 0 - 99 mg/dL  Heparin level (unfractionated)     Status: Abnormal   Collection Time: 08/16/16  4:49 AM  Result Value Ref Range   Heparin Unfractionated <0.10 (L) 0.30 - 0.70 IU/mL  Glucose, capillary     Status: Abnormal   Collection Time: 08/16/16  7:36 AM  Result Value Ref Range   Glucose-Capillary 104 (H) 65 - 99 mg/dL    Studies/Results: Dg Chest 2 View  Result Date: 08/15/2016 CLINICAL DATA:  Congestion for 2 weeks EXAM: CHEST  2 VIEW COMPARISON:  December 26, 2015 FINDINGS: The mediastinal contour is normal. The heart size is mildly enlarged. There are bilateral pleural effusions. Mild increased pulmonary interstitium is identified bilaterally. The bony structures are stable. IMPRESSION: Mild interstitial edema.  Small bilateral pleural effusions. Electronically Signed   By: Abelardo Diesel M.D.   On: 08/15/2016 12:26    Medications: Reviewed  @PROBHOSP @  1  Chest pain  Hx CAD PTCA 2000 Cath in 2013 60^% LAD  50 to 60% RCA    Medical Rx   NOw with progressive chest prssure  Trop with trivial elevation I agree that he should proceed with L heart catheteriztion  Plan for tomorrow Echo pending  2  PAF  Declined anticoagulation  On ASA and Plavix  CHADSVASc of 6 On heparin  REmains in SR    3   LE edema  He missed lasix at home  WIll dose IV   4.  Type II DM  5  HL   LDL is 55    LOS: 1 day   Dorris Carnes 08/16/2016, 8:14 AM

## 2016-08-16 NOTE — Progress Notes (Signed)
[facility] for heparin  Indication: chest pain/ACS  Allergies  Allergen Reactions  . Xarelto [Rivaroxaban] Other (See Comments)    rash    Patient Measurements: Height: 6\' 4"  (193 cm) Weight: (!) 366 lb 12.8 oz (166.4 kg) IBW/kg (Calculated) : 86.8 Heparin Dosing Weight: 126kg  Vital Signs: Temp: 98.4 F (36.9 C) (09/03 2357) Temp Source: Oral (09/03 2357) BP: 105/42 (09/03 2357) Pulse Rate: 56 (09/03 2357)  Labs:  Recent Labs  08/15/16 1135 08/15/16 1806 08/15/16 2300 08/15/16 2344  HGB 12.6*  --  11.4*  --   HCT 42.1  --  37.7*  --   PLT 236  --  191  --   LABPROT  --  14.3  --   --   INR  --  1.11  --   --   HEPARINUNFRC  --   --   --  0.50  CREATININE 0.91  --   --   --   TROPONINI  --  0.03*  --   --     Estimated Creatinine Clearance: 119.5 mL/min (by C-G formula based on SCr of 0.91 mg/dL).  Assessment: 75 y.o. male with chest pain for heparin   Goal of Therapy:  Heparin level 0.3-0.7 units/ml Monitor platelets by anticoagulation protocol: Yes   Plan:  .Continue Heparin at current rate for now Follow-up am labs.  Phillis Knack, PharmD, BCPS  08/16/2016 12:19 AM

## 2016-08-16 NOTE — Progress Notes (Signed)
ANTICOAGULATION CONSULT NOTE - Follow Up Consult  Pharmacy Consult for Heparin  Indication: chest pain/ACS  Allergies  Allergen Reactions  . Xarelto [Rivaroxaban] Other (See Comments)    rash    Patient Measurements: Height: 6\' 4"  (193 cm) Weight: (!) 361 lb 8 oz (164 kg) IBW/kg (Calculated) : 86.8 Heparin Dosing Weight: 126 kg   Vital Signs: Temp: 98.1 F (36.7 C) (09/04 0738) Temp Source: Oral (09/04 0738) BP: 115/49 (09/04 0738) Pulse Rate: 57 (09/04 0738)  Labs:  Recent Labs  08/15/16 1135 08/15/16 1806 08/15/16 2300 08/15/16 2344 08/16/16 0448 08/16/16 0449 08/16/16 0750  HGB 12.6*  --  11.4*  --   --   --   --   HCT 42.1  --  37.7*  --   --   --   --   PLT 236  --  191  --   --   --   --   LABPROT  --  14.3  --   --   --   --   --   INR  --  1.11  --   --   --   --   --   HEPARINUNFRC  --   --   --  0.50  --  <0.10* 0.71*  CREATININE 0.91  --   --   --  0.85  --   --   TROPONINI  --  0.03*  --  0.04* 0.04*  --   --     Estimated Creatinine Clearance: 126.9 mL/min (by C-G formula based on SCr of 0.85 mg/dL).   Medications:  Infusions:  . heparin 1,400 Units/hr (08/16/16 0902)    Assessment: Charles [name] is a 75 yo male who was admitted on 08/15/2016 for chest pain. Pharmacy has been asked to dose and monitor heparin. This morning's confirmatory heparin level was not detectable at < 0.10, however lab stated that it could be a possible error. Therefore, the heparin infusion was not adjusted and a bolus was not given as the previous note stated. The patient was maintained at 1500 units/hr until the heparin level was re-checked. The heparin level is now slightly supratherapeutic at 0.71. Hemoglobin and platelets remain stable. No bleeding noted. Left heart catheterization planned for tomorrow.   Goal of Therapy:  Heparin level 0.3-0.7 units/ml Monitor platelets by anticoagulation protocol: Yes   Plan:  Decrease heparin to 1400 units/hr Recheck heparin level in 6  hours at 1500 Monitor daily heparin level, CBC, and s/sx's of bleeding Follow-up post-catheterization tomorrow   Belia Heman, PharmD PGY1 Pharmacy Resident [phone] (Pager) 08/16/2016 9:12 AM

## 2016-08-17 ENCOUNTER — Encounter (HOSPITAL_COMMUNITY)
Admission: EM | Disposition: A | Discharge status: Home / Self Care | Payer: Self-pay | Source: Home / Self Care | Attending: Internal Medicine

## 2016-08-17 ENCOUNTER — Inpatient Hospital Stay (HOSPITAL_COMMUNITY): Payer: Medicare Other

## 2016-08-17 ENCOUNTER — Encounter (HOSPITAL_COMMUNITY): Payer: Self-pay | Admitting: Cardiology

## 2016-08-17 DIAGNOSIS — I2510 Atherosclerotic heart disease of native coronary artery without angina pectoris: Secondary | ICD-10-CM

## 2016-08-17 HISTORY — PX: CARDIAC CATHETERIZATION: SHX172

## 2016-08-17 LAB — ECHOCARDIOGRAM COMPLETE
AVLVOTPG: 6 mmHg
CHL CUP DOP CALC LVOT VTI: 31.9 cm
EWDT: 134 ms
HEIGHTINCHES: 76 in
LA vol A4C: 76.3 ml
LA vol: 90 mL
LAVOLIN: 29.7 mL/m2
LV SIMPSON'S DISK: 64
LV dias vol: 163 mL — AB (ref 62–150)
LV sys vol index: 19 mL/m2
LVDIAVOLIN: 54 mL/m2
LVELAT: 9.68 cm/s
LVOT SV: 110 mL
LVOT area: 3.46 cm2
LVOT peak vel: 122 cm/s
LVOTD: 21 mm
LVSYSVOL: 58 mL (ref 21–61)
MV Dec: 134
Stroke v: 105 ml
TAPSE: 22.5 mm
TDI e' lateral: 9.68
TDI e' medial: 6.64
WEIGHTICAEL: 5768 [oz_av]

## 2016-08-17 LAB — HEMOGLOBIN A1C
HEMOGLOBIN A1C: 6.3 % — AB (ref 4.8–5.6)
MEAN PLASMA GLUCOSE: 134 mg/dL

## 2016-08-17 LAB — GLUCOSE, CAPILLARY
GLUCOSE-CAPILLARY: 61 mg/dL — AB (ref 65–99)
GLUCOSE-CAPILLARY: 105 mg/dL — AB (ref 65–99)
GLUCOSE-CAPILLARY: 78 mg/dL (ref 65–99)
GLUCOSE-CAPILLARY: 103 mg/dL — AB (ref 65–99)
GLUCOSE-CAPILLARY: 171 mg/dL — AB (ref 65–99)
GLUCOSE-CAPILLARY: 142 mg/dL — AB (ref 65–99)
Glucose-Capillary: 125 mg/dL — ABNORMAL HIGH (ref 65–99)

## 2016-08-17 LAB — CBC
HEMATOCRIT: 36.7 % — AB (ref 39.0–52.0)
HEMOGLOBIN: 11.1 g/dL — AB (ref 13.0–17.0)
MCH: 30 pg (ref 26.0–34.0)
MCHC: 30.2 g/dL (ref 30.0–36.0)
MCV: 99.2 fL (ref 78.0–100.0)
Platelets: 186 10*3/uL (ref 150–400)
RBC: 3.7 MIL/uL — AB (ref 4.22–5.81)
RDW: 15.4 % (ref 11.5–15.5)
WBC: 5.3 10*3/uL (ref 4.0–10.5)

## 2016-08-17 LAB — HEPARIN LEVEL (UNFRACTIONATED): HEPARIN UNFRACTIONATED: 0.54 [IU]/mL (ref 0.30–0.70)

## 2016-08-17 SURGERY — LEFT HEART CATH AND CORONARY ANGIOGRAPHY
Anesthesia: LOCAL

## 2016-08-17 MED ORDER — MIDAZOLAM HCL 2 MG/2ML IJ SOLN
INTRAMUSCULAR | Status: DC | PRN
  Administered 2016-08-17: 1 mg via INTRAVENOUS

## 2016-08-17 MED ORDER — IOPAMIDOL (ISOVUE-370) INJECTION 76%
INTRAVENOUS | Status: AC
  Filled 2016-08-17: qty 100

## 2016-08-17 MED ORDER — FENTANYL CITRATE (PF) 100 MCG/2ML IJ SOLN
INTRAMUSCULAR | Status: AC
  Filled 2016-08-17: qty 2

## 2016-08-17 MED ORDER — PERFLUTREN LIPID MICROSPHERE
1.0000 mL | INTRAVENOUS | Status: AC | PRN
  Administered 2016-08-17: 2 mL via INTRAVENOUS
  Filled 2016-08-17: qty 10

## 2016-08-17 MED ORDER — HEPARIN (PORCINE) IN NACL 100-0.45 UNIT/ML-% IJ SOLN
1400.0000 [IU]/h | INTRAMUSCULAR | Status: DC
  Administered 2016-08-17 – 2016-08-19 (×2): 1400 [IU]/h via INTRAVENOUS
  Filled 2016-08-17 (×3): qty 250

## 2016-08-17 MED ORDER — LIDOCAINE HCL (PF) 1 % IJ SOLN
INTRAMUSCULAR | Status: AC
  Filled 2016-08-17: qty 30

## 2016-08-17 MED ORDER — IOPAMIDOL (ISOVUE-370) INJECTION 76%
INTRAVENOUS | Status: DC | PRN
  Administered 2016-08-17: 105 mL via INTRAVENOUS

## 2016-08-17 MED ORDER — PRAVASTATIN SODIUM 40 MG PO TABS
40.0000 mg | ORAL_TABLET | Freq: Every day | ORAL | Status: DC
  Administered 2016-08-17 – 2016-08-18 (×2): 40 mg via ORAL
  Filled 2016-08-17 (×2): qty 1

## 2016-08-17 MED ORDER — METOPROLOL TARTRATE 25 MG PO TABS
25.0000 mg | ORAL_TABLET | Freq: Two times a day (BID) | ORAL | Status: DC
  Administered 2016-08-17 – 2016-08-20 (×5): 25 mg via ORAL
  Filled 2016-08-17 (×6): qty 1

## 2016-08-17 MED ORDER — SODIUM CHLORIDE 0.9% FLUSH
3.0000 mL | Freq: Two times a day (BID) | INTRAVENOUS | Status: DC
Start: 2016-08-17 — End: 2016-08-20
  Administered 2016-08-18 – 2016-08-19 (×2): 3 mL via INTRAVENOUS

## 2016-08-17 MED ORDER — SODIUM CHLORIDE 0.9% FLUSH: 3.0000 mL | INTRAVENOUS | Status: DC | PRN

## 2016-08-17 MED ORDER — HEPARIN (PORCINE) IN NACL 2-0.9 UNIT/ML-% IJ SOLN
INTRAMUSCULAR | Status: AC
  Filled 2016-08-17: qty 1500

## 2016-08-17 MED ORDER — HEPARIN (PORCINE) IN NACL 2-0.9 UNIT/ML-% IJ SOLN
INTRAMUSCULAR | Status: DC | PRN
  Administered 2016-08-17: 1500 mL

## 2016-08-17 MED ORDER — DEXTROSE 50 % IV SOLN
1.0000 | Freq: Once | INTRAVENOUS | Status: AC
  Administered 2016-08-17: 50 mL via INTRAVENOUS

## 2016-08-17 MED ORDER — IOPAMIDOL (ISOVUE-370) INJECTION 76%
INTRAVENOUS | Status: AC
  Filled 2016-08-17: qty 50

## 2016-08-17 MED ORDER — SODIUM CHLORIDE 0.9 % IV SOLN: 250.0000 mL | INTRAVENOUS | Status: DC | PRN

## 2016-08-17 MED ORDER — DEXTROSE 50 % IV SOLN
INTRAVENOUS | Status: AC
  Filled 2016-08-17: qty 50

## 2016-08-17 MED ORDER — FENTANYL CITRATE (PF) 100 MCG/2ML IJ SOLN
INTRAMUSCULAR | Status: DC | PRN
  Administered 2016-08-17: 25 ug via INTRAVENOUS

## 2016-08-17 MED ORDER — MIDAZOLAM HCL 2 MG/2ML IJ SOLN
INTRAMUSCULAR | Status: AC
  Filled 2016-08-17: qty 2

## 2016-08-17 MED ORDER — LIDOCAINE HCL (PF) 1 % IJ SOLN
INTRAMUSCULAR | Status: DC | PRN
  Administered 2016-08-17: 3 mL
  Administered 2016-08-17: 15 mL

## 2016-08-17 MED ORDER — SODIUM CHLORIDE 0.9 % WEIGHT BASED INFUSION
1.0000 mL/kg/h | INTRAVENOUS | Status: AC
  Administered 2016-08-17: 1 mL/kg/h via INTRAVENOUS

## 2016-08-17 MED ORDER — VERAPAMIL HCL 2.5 MG/ML IV SOLN
INTRAVENOUS | Status: AC
  Filled 2016-08-17: qty 2

## 2016-08-17 MED ORDER — PERFLUTREN LIPID MICROSPHERE
INTRAVENOUS | Status: AC
  Filled 2016-08-17: qty 10

## 2016-08-17 SURGICAL SUPPLY — 15 items
CATH INFINITI 5FR ANG PIGTAIL (CATHETERS) ×1 IMPLANT
CATH INFINITI 5FR JL4 (CATHETERS) ×1 IMPLANT
CATH INFINITI JR4 5F (CATHETERS) ×1 IMPLANT
COVER PRB 48X5XTLSCP FOLD TPE (BAG) IMPLANT
COVER PROBE 5X48 (BAG) ×2
GLIDESHEATH SLEND SS 6F .021 (SHEATH) ×1 IMPLANT
HOVERMATT SINGLE USE (MISCELLANEOUS) ×1 IMPLANT
KIT HEART LEFT (KITS) ×2 IMPLANT
PACK CARDIAC CATHETERIZATION (CUSTOM PROCEDURE TRAY) ×2 IMPLANT
SHEATH PINNACLE 5F 10CM (SHEATH) ×1 IMPLANT
SYR MEDRAD MARK V 150ML (SYRINGE) ×2 IMPLANT
TRANSDUCER W/STOPCOCK (MISCELLANEOUS) ×2 IMPLANT
TUBING CIL FLEX 10 FLL-RA (TUBING) ×2 IMPLANT
WIRE EMERALD 3MM-J .035X150CM (WIRE) ×1 IMPLANT
WIRE HI TORQ VERSACORE-J 145CM (WIRE) ×1 IMPLANT

## 2016-08-17 NOTE — Progress Notes (Signed)
Site area: rt groin sheath pulled by Karena Addison Site Prior to Removal:  Level 0 Pressure Applied For:  20 minutes Manual:   yes Patient Status During Pull:  stable Post Pull Site:  Level  0 Post Pull Instructions Given:  yes Post Pull Pulses Present: yes Dressing Applied:  tegaderm Bedrest begins @  1440 Comments:

## 2016-08-17 NOTE — Progress Notes (Signed)
Hypoglycemic Event  CBG: 61  Treatment: 1 amp Dextrose 50%  Symptoms: None per patient  Follow-up CBG: [id number] CBG Result:125  Possible Reasons for Event: patient is NPO for cardiac cath this afternoon  Comments/MD notified:     [facility]

## 2016-08-17 NOTE — Progress Notes (Signed)
Patient's wife placed him on CPAP for the night. Patient is tolerating Auto titrate very well and RT will continue to monitor as needed.

## 2016-08-17 NOTE — Progress Notes (Signed)
Discussed findings of cardiac cath with patient and earlier with wife. Options limited. Patient is a relatively poor candidate for CABG. He has progressive high grade disease in the RCA. Discussed continued medical therapy versus PCI. I think he will continue to have limiting angina on medical therapy. PCI will be challenging due to anatomy (tortuosity and calcification) but hopefully with aggressive support this could be managed. I think an attempt at PCI is his best option. Discussed risks of procedure including MI, dissection, perforation, death, bleeding, arrhythmia. Patient agreeable. Will plan on Thursday 08/19/16.  Laurenashley Viar Martinique MD, [facility]

## 2016-08-17 NOTE — H&P (View-Only) (Signed)
Subjective: No CP  Breathing is OK   Objective: Vitals:   08/15/16 2230 08/15/16 2357 08/16/16 0500 08/16/16 0738  BP:  (!) 105/42 (!) 99/46 (!) 115/49  Pulse:  (!) 56 (!) 53 (!) 57  Resp:  15 14 20   Temp:  98.4 F (36.9 C) 98.3 F (36.8 C) 98.1 F (36.7 C)  TempSrc:  Oral Oral Oral  SpO2: 98% 96% 94% 95%  Weight:   (!) 361 lb 8 oz (164 kg)   Height:       Weight change:   Intake/Output Summary (Last 24 hours) at 08/16/16 [id number] Last data filed at 08/16/16 0400  Gross per 24 hour  Intake            324.5 ml  Output              475 ml  Net           -150.5 ml    General: Alert, awake, oriented x3, in no acute distress Neck:  JVP is difficult to assess with beard   Heart: Regular rate and rhythm, without murmurs, rubs, gallops.  Lungs: Clear to auscultation.  No rales or wheezes. Exemities:  1+ edema.  Erythema to legs   Neuro: Grossly intact, nonfocal.  Tele  SR    Lab Results: Results for orders placed or performed during the hospital encounter of 08/15/16 (from the past 24 hour(s))  Basic metabolic panel     Status: Abnormal   Collection Time: 08/15/16 11:35 AM  Result Value Ref Range   Sodium 139 135 - 145 mmol/L   Potassium 4.7 3.5 - 5.1 mmol/L   Chloride 107 101 - 111 mmol/L   CO2 29 22 - 32 mmol/L   Glucose, Bld 130 (H) 65 - 99 mg/dL   BUN 13 6 - 20 mg/dL   Creatinine, Ser 0.91 0.61 - 1.24 mg/dL   Calcium 9.2 8.9 - 10.3 mg/dL   GFR calc non Af Amer >60 >60 mL/min   GFR calc Af Amer >60 >60 mL/min   Anion gap 3 (L) 5 - 15  CBC     Status: Abnormal   Collection Time: 08/15/16 11:35 AM  Result Value Ref Range   WBC 6.4 4.0 - 10.5 K/uL   RBC 4.18 (L) 4.22 - 5.81 MIL/uL   Hemoglobin 12.6 (L) 13.0 - 17.0 g/dL   HCT 42.1 39.0 - 52.0 %   MCV 100.7 (H) 78.0 - 100.0 fL   MCH 30.1 26.0 - 34.0 pg   MCHC 29.9 (L) 30.0 - 36.0 g/dL   RDW 15.4 11.5 - 15.5 %   Platelets 236 150 - 400 K/uL  Brain natriuretic peptide     Status: Abnormal   Collection Time:  08/15/16 11:35 AM  Result Value Ref Range   B Natriuretic Peptide 336.5 (H) 0.0 - 100.0 pg/mL  I-stat troponin, ED     Status: None   Collection Time: 08/15/16 11:51 AM  Result Value Ref Range   Troponin i, poc 0.04 0.00 - 0.08 ng/mL   Comment 3          Glucose, capillary     Status: None   Collection Time: 08/15/16  4:32 PM  Result Value Ref Range   Glucose-Capillary 99 65 - 99 mg/dL  Troponin I     Status: Abnormal   Collection Time: 08/15/16  6:06 PM  Result Value Ref Range   Troponin I 0.03 (HH) <0.03 ng/mL  Protime-INR  Status: None   Collection Time: 08/15/16  6:06 PM  Result Value Ref Range   Prothrombin Time 14.3 11.4 - 15.2 seconds   INR 1.11   Glucose, capillary     Status: Abnormal   Collection Time: 08/15/16  9:29 PM  Result Value Ref Range   Glucose-Capillary 138 (H) 65 - 99 mg/dL  CBC     Status: Abnormal   Collection Time: 08/15/16 11:00 PM  Result Value Ref Range   WBC 5.4 4.0 - 10.5 K/uL   RBC 3.81 (L) 4.22 - 5.81 MIL/uL   Hemoglobin 11.4 (L) 13.0 - 17.0 g/dL   HCT 37.7 (L) 39.0 - 52.0 %   MCV 99.0 78.0 - 100.0 fL   MCH 29.9 26.0 - 34.0 pg   MCHC 30.2 30.0 - 36.0 g/dL   RDW 15.3 11.5 - 15.5 %   Platelets 191 150 - 400 K/uL  Troponin I     Status: Abnormal   Collection Time: 08/15/16 11:44 PM  Result Value Ref Range   Troponin I 0.04 (HH) <0.03 ng/mL  Heparin level (unfractionated)     Status: None   Collection Time: 08/15/16 11:44 PM  Result Value Ref Range   Heparin Unfractionated 0.50 0.30 - 0.70 IU/mL  Troponin I     Status: Abnormal   Collection Time: 08/16/16  4:48 AM  Result Value Ref Range   Troponin I 0.04 (HH) <0.03 ng/mL  Basic metabolic panel     Status: Abnormal   Collection Time: 08/16/16  4:48 AM  Result Value Ref Range   Sodium 138 135 - 145 mmol/L   Potassium 3.9 3.5 - 5.1 mmol/L   Chloride 107 101 - 111 mmol/L   CO2 27 22 - 32 mmol/L   Glucose, Bld 107 (H) 65 - 99 mg/dL   BUN 12 6 - 20 mg/dL   Creatinine, Ser 0.85 0.61 -  1.24 mg/dL   Calcium 8.6 (L) 8.9 - 10.3 mg/dL   GFR calc non Af Amer >60 >60 mL/min   GFR calc Af Amer >60 >60 mL/min   Anion gap 4 (L) 5 - 15  Lipid panel     Status: Abnormal   Collection Time: 08/16/16  4:48 AM  Result Value Ref Range   Cholesterol 98 0 - 200 mg/dL   Triglycerides 49 <150 mg/dL   HDL 33 (L) >40 mg/dL   Total CHOL/HDL Ratio 3.0 RATIO   VLDL 10 0 - 40 mg/dL   LDL Cholesterol 55 0 - 99 mg/dL  Heparin level (unfractionated)     Status: Abnormal   Collection Time: 08/16/16  4:49 AM  Result Value Ref Range   Heparin Unfractionated <0.10 (L) 0.30 - 0.70 IU/mL  Glucose, capillary     Status: Abnormal   Collection Time: 08/16/16  7:36 AM  Result Value Ref Range   Glucose-Capillary 104 (H) 65 - 99 mg/dL    Studies/Results: Dg Chest 2 View  Result Date: 08/15/2016 CLINICAL DATA:  Congestion for 2 weeks EXAM: CHEST  2 VIEW COMPARISON:  December 26, 2015 FINDINGS: The mediastinal contour is normal. The heart size is mildly enlarged. There are bilateral pleural effusions. Mild increased pulmonary interstitium is identified bilaterally. The bony structures are stable. IMPRESSION: Mild interstitial edema.  Small bilateral pleural effusions. Electronically Signed   By: Abelardo Diesel M.D.   On: 08/15/2016 12:26    Medications: Reviewed  @PROBHOSP @  1  Chest pain  Hx CAD PTCA 2000 Cath in 2013 60^% LAD  50 to 60% RCA    Medical Rx   NOw with progressive chest prssure  Trop with trivial elevation I agree that he should proceed with L heart catheteriztion  Plan for tomorrow Echo pending  2  PAF  Declined anticoagulation  On ASA and Plavix  CHADSVASc of 6 On heparin  REmains in SR    3   LE edema  He missed lasix at home  WIll dose IV   4.  Type II DM  5  HL   LDL is 55    LOS: 1 day   Charles [name] 08/16/2016, 8:14 AM

## 2016-08-17 NOTE — Progress Notes (Signed)
Patient ID: Charles [name], male   DOB: 02/26/1941, 75 y.o.   MRN: [id number]  Subjective: Charles [name] is a 75 y.o. is seen today in office today for follow-up evaluation of submetatarsal one wound on the left foot. His wife has been applying puracol to the wound daily and he states the wound is getting better. His foot the same in diameter but she feels is more superficial. She denies any drainage or pus or any surrounding redness or swelling. The patient denies pain at this time bone does a lot of walking does feel discomfort to this area.  Denies any systemic complaints such as fevers, chills, nausea, vomiting. No calf pain, chest pain, shortness of breath.   Objective: General: No acute distress, AAOx3  DP/PT pulses palpable, CRT < 3 sec to all digits.  Motor function intact.  Left foot: Previous hallux amputation. Submetatarsal one ulceration with hyperkeratotic periwound the left foot. Upon debridement the wound measures 0.8 x 0.5 x 0.3 cm. There is no probing to bone, undermining or tunneling. There is no surrounding erythema or ascending synovitis. There is no fluctuance or crepitus. No malodor. No other open lesions or pre-ulcerative lesions are identified today. No pain with calf compression, swelling, warmth, erythema.   Assessment and Plan:  Left foot submetatarsal 1 ulceration   -Treatment options discussed including all alternatives, risks, and complications -Hyperkeratotic tissue/wound was sharply debrided to granular tissue with a scalpel. Continue collagen silver dressing to the wound daily.  -Augmentin as the wound has increased in size and painful -Offloading  -Monitor for any clinical signs or symptoms of infection and directed to call the office immediately should any occur or go to the ER. -Follow-up in 3 weeks or sooner if any problems arise. In the meantime, encouraged to call the office with any questions, concerns, change in symptoms.   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

## 2016-08-17 NOTE — Interval H&P Note (Signed)
History and Physical Interval Note:  08/17/2016 12:43 PM  Bonnielee Haff  has presented today for surgery, with the diagnosis of unstable angina  The various methods of treatment have been discussed with the patient and family. After consideration of risks, benefits and other options for treatment, the patient has consented to  Procedure(s): Left Heart Cath and Coronary Angiography (N/A) as a surgical intervention .  The patient's history has been reviewed, patient examined, no change in status, stable for surgery.  I have reviewed the patient's chart and labs.  Questions were answered to the patient's satisfaction.    Cath Lab Visit (complete for each Cath Lab visit)  Clinical Evaluation Leading to the Procedure:   ACS: Yes.    Non-ACS:    Anginal Classification: CCS III  Anti-ischemic medical therapy: Maximal Therapy (2 or more classes of medications)  Non-Invasive Test Results: No non-invasive testing performed  Prior CABG: No previous CABG       Collier Salina [facility] 08/17/2016 12:43 PM

## 2016-08-17 NOTE — Progress Notes (Signed)
ANTICOAGULATION CONSULT NOTE - Follow Up Consult  Pharmacy Consult for Heparin Indication: chest pain/ACS  Assessment: Charles [name] is a 75 yo male who was admitted on 08/15/2016 for chest pain. He is now s/p cath with severe 2VCAD and for possible PCI of RCA. Pharmacy to restart heparin 8 hours after sheath removal (done at ~ 2:30pm) -Last heparin level was at goal (= 0.54) on 1400 units/hr  Goal of Therapy:  Heparin level 0.3-0.7 units/ml Monitor platelets by anticoagulation protocol: Yes   Plan:  Restart heparin infusion at 1400 units/hr at 10:30pm Monitor daily heparin level, CBC, and s/sx's of bleeding __________________________________________________________________________________________  Allergies  Allergen Reactions  . Xarelto [Rivaroxaban] Other (See Comments)    rash    Patient Measurements: Height: 6\' 4"  (193 cm) Weight: (!) 360 lb 8 oz (163.5 kg) IBW/kg (Calculated) : 86.8 Heparin Dosing Weight: 126 kg  Vital Signs: Temp: 98.4 F (36.9 C) (09/05 1510) Temp Source: Oral (09/05 1510) BP: 148/66 (09/05 1510) Pulse Rate: 67 (09/05 1510)  Labs:  Recent Labs  08/15/16 1135 08/15/16 1806 08/15/16 2300 08/15/16 2344 08/16/16 0448  08/16/16 0750 08/16/16 1441 08/17/16 0537  HGB 12.6*  --  11.4*  --   --   --   --   --  11.1*  HCT 42.1  --  37.7*  --   --   --   --   --  36.7*  PLT 236  --  191  --   --   --   --   --  186  LABPROT  --  14.3  --   --   --   --   --   --   --   INR  --  1.11  --   --   --   --   --   --   --   HEPARINUNFRC  --   --   --  0.50  --   < > 0.71* 0.62 0.54  CREATININE 0.91  --   --   --  0.85  --   --   --   --   TROPONINI  --  0.03*  --  0.04* 0.04*  --   --   --   --   < > = values in this interval not displayed.  Estimated Creatinine Clearance: 126.7 mL/min (by C-G formula based on SCr of 0.85 mg/dL).   Medications:  Infusions:  . sodium chloride    . heparin 1,400 Units/hr (08/16/16 0902)   Belia Heman, PharmD PGY1  Pharmacy Resident [phone] (Pager) 08/17/2016 3:11 PM

## 2016-08-17 NOTE — Progress Notes (Signed)
ANTICOAGULATION CONSULT NOTE - Follow Up Consult  Pharmacy Consult for Heparin Indication: chest pain/ACS  Assessment: Charles [name] is a 75 yo male who was admitted on 08/15/2016 for chest pain. Pharmacy has been asked to dose and monitor heparin. Heparin level this morning is therapeutic at 0.54 on 1400 units/hr. Hemoglobin and platelets remain stable. No bleeding noted. Left heart catheterization planned for today.  Goal of Therapy:  Heparin level 0.3-0.7 units/ml Monitor platelets by anticoagulation protocol: Yes   Plan:  Continue heparin infusion 1400 units/hr Monitor daily heparin level, CBC, and s/sx's of bleeding __________________________________________________________________________________________  Allergies  Allergen Reactions  . Xarelto [Rivaroxaban] Other (See Comments)    rash    Patient Measurements: Height: 6\' 4"  (193 cm) Weight: (!) 360 lb 8 oz (163.5 kg) IBW/kg (Calculated) : 86.8 Heparin Dosing Weight: 126 kg  Vital Signs: Temp: 97.8 F (36.6 C) (09/05 0636) Temp Source: Oral (09/05 0636) BP: 118/43 (09/05 0636) Pulse Rate: 53 (09/05 0636)  Labs:  Recent Labs  08/15/16 1135 08/15/16 1806 08/15/16 2300 08/15/16 2344 08/16/16 0448  08/16/16 0750 08/16/16 1441 08/17/16 0537  HGB 12.6*  --  11.4*  --   --   --   --   --  11.1*  HCT 42.1  --  37.7*  --   --   --   --   --  36.7*  PLT 236  --  191  --   --   --   --   --  186  LABPROT  --  14.3  --   --   --   --   --   --   --   INR  --  1.11  --   --   --   --   --   --   --   HEPARINUNFRC  --   --   --  0.50  --   < > 0.71* 0.62 0.54  CREATININE 0.91  --   --   --  0.85  --   --   --   --   TROPONINI  --  0.03*  --  0.04* 0.04*  --   --   --   --   < > = values in this interval not displayed.  Estimated Creatinine Clearance: 126.7 mL/min (by C-G formula based on SCr of 0.85 mg/dL).   Medications:  Infusions:  . sodium chloride    . sodium chloride 75 mL/hr at 08/17/16 0510  . heparin 1,400  Units/hr (08/16/16 0902)   Belia Heman, PharmD PGY1 Pharmacy Resident [phone] (Pager) 08/17/2016 7:46 AM

## 2016-08-17 NOTE — Progress Notes (Signed)
  Echocardiogram 2D Echocardiogram has been performed with definity.  Charles [name] 08/17/2016, 4:25 PM

## 2016-08-18 LAB — GLUCOSE, CAPILLARY
GLUCOSE-CAPILLARY: 120 mg/dL — AB (ref 65–99)
GLUCOSE-CAPILLARY: 142 mg/dL — AB (ref 65–99)
GLUCOSE-CAPILLARY: 201 mg/dL — AB (ref 65–99)
Glucose-Capillary: 107 mg/dL — ABNORMAL HIGH (ref 65–99)

## 2016-08-18 LAB — BASIC METABOLIC PANEL
Anion gap: 11 (ref 5–15)
BUN: 14 mg/dL (ref 6–20)
CHLORIDE: 101 mmol/L (ref 101–111)
CO2: 26 mmol/L (ref 22–32)
Calcium: 8 mg/dL — ABNORMAL LOW (ref 8.9–10.3)
Creatinine, Ser: 0.89 mg/dL (ref 0.61–1.24)
GFR calc Af Amer: >60 mL/min (ref >60)
GLUCOSE: 112 mg/dL — AB (ref 65–99)
POTASSIUM: 3.7 mmol/L (ref 3.5–5.1)
Sodium: 138 mmol/L (ref 135–145)

## 2016-08-18 LAB — CBC
HEMATOCRIT: 35.8 % — AB (ref 39.0–52.0)
Hemoglobin: 11 g/dL — ABNORMAL LOW (ref 13.0–17.0)
MCH: 30.3 pg (ref 26.0–34.0)
MCHC: 30.7 g/dL (ref 30.0–36.0)
MCV: 98.6 fL (ref 78.0–100.0)
PLATELETS: 194 10*3/uL (ref 150–400)
RBC: 3.63 MIL/uL — AB (ref 4.22–5.81)
RDW: 15.5 % (ref 11.5–15.5)
WBC: 4.8 10*3/uL (ref 4.0–10.5)

## 2016-08-18 LAB — HEPARIN LEVEL (UNFRACTIONATED): Heparin Unfractionated: 0.4 IU/mL (ref 0.30–0.70)

## 2016-08-18 MED ORDER — SODIUM CHLORIDE 0.9 % IV SOLN
INTRAVENOUS | Status: DC
  Administered 2016-08-19: 06:00:00 via INTRAVENOUS

## 2016-08-18 MED ORDER — SODIUM CHLORIDE 0.9% FLUSH
3.0000 mL | Freq: Two times a day (BID) | INTRAVENOUS | Status: DC
  Administered 2016-08-18: 3 mL via INTRAVENOUS

## 2016-08-18 MED ORDER — SODIUM CHLORIDE 0.9% FLUSH: 3.0000 mL | INTRAVENOUS | Status: DC | PRN

## 2016-08-18 MED ORDER — SODIUM CHLORIDE 0.9 % IV SOLN: 250.0000 mL | INTRAVENOUS | Status: DC | PRN

## 2016-08-18 NOTE — Progress Notes (Signed)
Pts scheduled Metoprolol and Pravachol not resumed post cath. Pt is states he MUST have his metoprolol as he takes at home or he will go back into AFib. Provider on call paged and orders received. Jessie Foot, RN

## 2016-08-18 NOTE — Progress Notes (Signed)
Subjective: No CP  NO SOB at rest   Objective: Vitals:   08/17/16 1733 08/17/16 2033 08/18/16 0500 08/18/16 0857  BP: 94/67 (!) 113/55 (!) 108/48 (!) 118/39  Pulse:  66 (!) 50 62  Resp: 17 18 18    Temp:  98 F (36.7 C) 98 F (36.7 C)   TempSrc:  Oral Oral   SpO2:  95% 94%   Weight:      Height:       Weight change:   Intake/Output Summary (Last 24 hours) at 08/18/16 1115 Last data filed at 08/18/16 0900  Gross per 24 hour  Intake            675.6 ml  Output              975 ml  Net           -299.4 ml   I/O 1.1 L negative    General: Alert, awake, oriented x3, in no acute distress Neck:  JVP is difficult to assess Heart: Regular rate and rhythm, without murmurs, rubs, gallops.  Lungs: Clear to auscultation.  No rales or wheezes. Exemities:  1+ edema.   Neuro: Grossly intact, nonfocal.   Lab Results: Results for orders placed or performed during the hospital encounter of 08/15/16 (from the past 24 hour(s))  Glucose, capillary     Status: None   Collection Time: 08/17/16  2:14 PM  Result Value Ref Range   Glucose-Capillary 78 65 - 99 mg/dL  Glucose, capillary     Status: Abnormal   Collection Time: 08/17/16  4:19 PM  Result Value Ref Range   Glucose-Capillary 103 (H) 65 - 99 mg/dL  Glucose, capillary     Status: Abnormal   Collection Time: 08/17/16  8:37 PM  Result Value Ref Range   Glucose-Capillary 171 (H) 65 - 99 mg/dL  Glucose, capillary     Status: Abnormal   Collection Time: 08/17/16 10:59 PM  Result Value Ref Range   Glucose-Capillary 142 (H) 65 - 99 mg/dL  CBC     Status: Abnormal   Collection Time: 08/18/16  4:27 AM  Result Value Ref Range   WBC 4.8 4.0 - 10.5 K/uL   RBC 3.63 (L) 4.22 - 5.81 MIL/uL   Hemoglobin 11.0 (L) 13.0 - 17.0 g/dL   HCT 35.8 (L) 39.0 - 52.0 %   MCV 98.6 78.0 - 100.0 fL   MCH 30.3 26.0 - 34.0 pg   MCHC 30.7 30.0 - 36.0 g/dL   RDW 15.5 11.5 - 15.5 %   Platelets 194 150 - 400 K/uL  Basic metabolic panel     Status:  Abnormal   Collection Time: 08/18/16  4:27 AM  Result Value Ref Range   Sodium 138 135 - 145 mmol/L   Potassium 3.7 3.5 - 5.1 mmol/L   Chloride 101 101 - 111 mmol/L   CO2 26 22 - 32 mmol/L   Glucose, Bld 112 (H) 65 - 99 mg/dL   BUN 14 6 - 20 mg/dL   Creatinine, Ser 0.89 0.61 - 1.24 mg/dL   Calcium 8.0 (L) 8.9 - 10.3 mg/dL   GFR calc non Af Amer >60 >60 mL/min   GFR calc Af Amer >60 >60 mL/min   Anion gap 11 5 - 15  Glucose, capillary     Status: Abnormal   Collection Time: 08/18/16  7:23 AM  Result Value Ref Range   Glucose-Capillary 107 (H) 65 - 99 mg/dL  Heparin level (unfractionated)  Status: None   Collection Time: 08/18/16  7:35 AM  Result Value Ref Range   Heparin Unfractionated 0.40 0.30 - 0.70 IU/mL    Studies/Results: No results found.  Medications:REviewed     @PROBHOSP @  1  CAD  Cath yesterday    Conclusion     Ost LAD to Prox LAD lesion, 20 %stenosed.  Mid LAD lesion, 60 %stenosed.  Dist LAD lesion, 80 %stenosed.  Ost Cx to Prox Cx lesion, 30 %stenosed.  Mid Cx lesion, 30 %stenosed.  Prox RCA lesion, 90 %stenosed.  The left ventricular systolic function is normal.  LV end diastolic pressure is mildly elevated.  The left ventricular ejection fraction is 55-65% by visual estimate.   1. 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 This is chronic and unchanged.    - severe proximal RCA stenosis. This has progressed significantly from 2013 and appears to be the culprit lesion. It is a very complex lesion with a severe Shepherd's crook deformity, heavy calcification and filling defect consistent with thrombus. 2. Overall good LV function   Plan for intervention tomorrow  Keep on heparin  2  PAF  Denied anticoag  On ASA and plavix CHADSVASC of 6    3  LE edemia  Getting lasix daily  Responding      LOS: 3 days   Charles [name] 08/18/2016, 11:15 AM

## 2016-08-18 NOTE — Care Management Important Message (Signed)
Important Message  Patient Details  Name: Charles [name] MRN: [id number] Date of Birth: [birth year]   Medicare Important Message Given:  Yes    Charles [name] 08/18/2016, 9:10 AM

## 2016-08-18 NOTE — Progress Notes (Signed)
ANTICOAGULATION CONSULT NOTE - Follow Up Consult  Pharmacy Consult for Heparin Indication: chest pain/ACS  Assessment: Charles [name] is a 75 yo male who was admitted on 9/3/2017for chest pain. He is now s/p cath with severe 2VCAD in the distal LAD and proximal RCA with PCI planned for 9/7. Pharmacy restarted heparin 8 hours after sheath removal at 1400 units/hr. Heparin level 8 hours post-restart is 0.4 and therapeutic. Hemoglobin and platelets are low, but remain stable. No bleeding noted.   Goal of Therapy:  Heparin level 0.3-0.7 units/ml Monitor platelets by anticoagulation protocol: Yes   Plan:  Continue heparin 1400 units/hr Monitor daily heparin level, CBC, and s/sx's bleeding __________________________________________________________________________________________  Allergies  Allergen Reactions  . Xarelto [Rivaroxaban] Other (See Comments)    rash    Patient Measurements: Height: 6\' 4"  (193 cm) Weight: (!) 360 lb 8 oz (163.5 kg) IBW/kg (Calculated) : 86.8 Heparin Dosing Weight: 126 kg  Vital Signs: Temp: 98 F (36.7 C) (09/06 0500) Temp Source: Oral (09/06 0500) BP: 108/48 (09/06 0500) Pulse Rate: 50 (09/06 0500)  Labs:  Recent Labs  08/15/16 1135 08/15/16 1806 08/15/16 2300 08/15/16 2344 08/16/16 0448  08/16/16 1441 08/17/16 0537 08/18/16 0427 08/18/16 0735  HGB 12.6*  --  11.4*  --   --   --   --  11.1* 11.0*  --   HCT 42.1  --  37.7*  --   --   --   --  36.7* 35.8*  --   PLT 236  --  191  --   --   --   --  186 194  --   LABPROT  --  14.3  --   --   --   --   --   --   --   --   INR  --  1.11  --   --   --   --   --   --   --   --   HEPARINUNFRC  --   --   --  0.50  --   < > 0.62 0.54  --  0.40  CREATININE 0.91  --   --   --  0.85  --   --   --  0.89  --   TROPONINI  --  0.03*  --  0.04* 0.04*  --   --   --   --   --   < > = values in this interval not displayed.  Estimated Creatinine Clearance: 121 mL/min (by C-G formula based on SCr of 0.89  mg/dL).   Medications:  Infusions:  . [START ON 08/19/2016] sodium chloride    . heparin 1,400 Units/hr (08/17/16 2236)    Belia Heman, PharmD PGY1 Pharmacy Resident [phone] (Pager) 08/18/2016 8:46 AM

## 2016-08-19 ENCOUNTER — Encounter (HOSPITAL_COMMUNITY)
Admission: EM | Disposition: A | Discharge status: Home / Self Care | Payer: Self-pay | Source: Home / Self Care | Attending: Internal Medicine

## 2016-08-19 HISTORY — PX: CARDIAC CATHETERIZATION: SHX172

## 2016-08-19 LAB — GLUCOSE, CAPILLARY
GLUCOSE-CAPILLARY: 100 mg/dL — AB (ref 65–99)
GLUCOSE-CAPILLARY: 108 mg/dL — AB (ref 65–99)
GLUCOSE-CAPILLARY: 202 mg/dL — AB (ref 65–99)
Glucose-Capillary: 118 mg/dL — ABNORMAL HIGH (ref 65–99)

## 2016-08-19 LAB — CBC
HEMATOCRIT: 35.8 % — AB (ref 39.0–52.0)
HEMOGLOBIN: 11 g/dL — AB (ref 13.0–17.0)
MCH: 30.4 pg (ref 26.0–34.0)
MCHC: 30.7 g/dL (ref 30.0–36.0)
MCV: 98.9 fL (ref 78.0–100.0)
Platelets: 172 10*3/uL (ref 150–400)
RBC: 3.62 MIL/uL — AB (ref 4.22–5.81)
RDW: 15.1 % (ref 11.5–15.5)
WBC: 5.5 10*3/uL (ref 4.0–10.5)

## 2016-08-19 LAB — POCT ACTIVATED CLOTTING TIME: Activated Clotting Time: 593 seconds

## 2016-08-19 LAB — HEPARIN LEVEL (UNFRACTIONATED): HEPARIN UNFRACTIONATED: 0.44 [IU]/mL (ref 0.30–0.70)

## 2016-08-19 SURGERY — CORONARY BALLOON ANGIOPLASTY

## 2016-08-19 MED ORDER — LIDOCAINE HCL (PF) 1 % IJ SOLN
INTRAMUSCULAR | Status: DC | PRN
  Administered 2016-08-19: 15 mL

## 2016-08-19 MED ORDER — SODIUM CHLORIDE 0.9 % WEIGHT BASED INFUSION: 1.0000 mL/kg/h | INTRAVENOUS | Status: AC

## 2016-08-19 MED ORDER — MIDAZOLAM HCL 2 MG/2ML IJ SOLN
INTRAMUSCULAR | Status: DC | PRN
  Administered 2016-08-19: 2 mg via INTRAVENOUS

## 2016-08-19 MED ORDER — HEPARIN (PORCINE) IN NACL 2-0.9 UNIT/ML-% IJ SOLN
INTRAMUSCULAR | Status: DC | PRN
  Administered 2016-08-19: 14:00:00

## 2016-08-19 MED ORDER — HEPARIN (PORCINE) IN NACL 2-0.9 UNIT/ML-% IJ SOLN
INTRAMUSCULAR | Status: AC
  Filled 2016-08-19: qty 1000

## 2016-08-19 MED ORDER — BIVALIRUDIN 250 MG IV SOLR
INTRAVENOUS | Status: AC
  Filled 2016-08-19: qty 250

## 2016-08-19 MED ORDER — IOPAMIDOL (ISOVUE-370) INJECTION 76%
INTRAVENOUS | Status: AC
  Filled 2016-08-19: qty 100

## 2016-08-19 MED ORDER — SODIUM CHLORIDE 0.9 % IV SOLN
INTRAVENOUS | Status: DC | PRN
  Administered 2016-08-19: 14:00:00
  Administered 2016-08-19: 1.75 mg/kg/h via INTRAVENOUS
  Administered 2016-08-19: 13:00:00

## 2016-08-19 MED ORDER — LIDOCAINE HCL (PF) 1 % IJ SOLN
INTRAMUSCULAR | Status: AC
  Filled 2016-08-19: qty 30

## 2016-08-19 MED ORDER — SODIUM CHLORIDE 0.9 % IV SOLN: 250.0000 mL | INTRAVENOUS | Status: DC | PRN

## 2016-08-19 MED ORDER — FENTANYL CITRATE (PF) 100 MCG/2ML IJ SOLN
INTRAMUSCULAR | Status: AC
  Filled 2016-08-19: qty 2

## 2016-08-19 MED ORDER — BIVALIRUDIN BOLUS VIA INFUSION - CUPID
INTRAVENOUS | Status: DC | PRN
  Administered 2016-08-19: 121.125 mg via INTRAVENOUS

## 2016-08-19 MED ORDER — FENTANYL CITRATE (PF) 100 MCG/2ML IJ SOLN
INTRAMUSCULAR | Status: DC | PRN
  Administered 2016-08-19: 25 ug via INTRAVENOUS

## 2016-08-19 MED ORDER — SODIUM CHLORIDE 0.9% FLUSH: 3.0000 mL | INTRAVENOUS | Status: DC | PRN

## 2016-08-19 MED ORDER — IOPAMIDOL (ISOVUE-370) INJECTION 76%
INTRAVENOUS | Status: DC | PRN
  Administered 2016-08-19: 55 mL via INTRAVENOUS

## 2016-08-19 MED ORDER — MIDAZOLAM HCL 2 MG/2ML IJ SOLN
INTRAMUSCULAR | Status: AC
  Filled 2016-08-19: qty 2

## 2016-08-19 MED ORDER — SODIUM CHLORIDE 0.9% FLUSH: 3.0000 mL | Freq: Two times a day (BID) | INTRAVENOUS | Status: DC

## 2016-08-19 MED ORDER — HEPARIN (PORCINE) IN NACL 100-0.45 UNIT/ML-% IJ SOLN: 1400.0000 [IU]/h | INTRAMUSCULAR | Status: DC

## 2016-08-19 MED ORDER — ASPIRIN 81 MG PO CHEW
81.0000 mg | CHEWABLE_TABLET | Freq: Once | ORAL | Status: AC
  Administered 2016-08-19: 81 mg via ORAL
  Filled 2016-08-19: qty 1

## 2016-08-19 MED ORDER — HEPARIN (PORCINE) IN NACL 100-0.45 UNIT/ML-% IJ SOLN
1400.0000 [IU]/h | INTRAMUSCULAR | Status: DC
  Administered 2016-08-19: 1400 [IU]/h via INTRAVENOUS
  Filled 2016-08-19: qty 250

## 2016-08-19 SURGICAL SUPPLY — 24 items
BALLN MAVERICK OTW 3.0X15 (BALLOONS) ×3
BALLN SPRINTER MX OTW 1.5X12 (BALLOONS) ×3
BALLN TREK OTW 2.5X12 (BALLOONS) ×3
BALLN TREK OTW 2.5X6 (BALLOONS) ×3
BALLOON MAVERICK OTW 3.0X15 (BALLOONS) IMPLANT
BALLOON SPRINTER MX OTW 1.5X12 (BALLOONS) IMPLANT
BALLOON TREK OTW 2.5X12 (BALLOONS) IMPLANT
BALLOON TREK OTW 2.5X6 (BALLOONS) IMPLANT
CATH GUIDEZILLA 6F (MICROCATHETER) ×2 IMPLANT
CATH MICROGUIDE FINCRSS 150 CM (MICROCATHETER) IMPLANT
GUIDE CATH MACH 1 7F AL1 (CATHETERS) ×2 IMPLANT
HOVERMATT SINGLE USE (MISCELLANEOUS) ×2 IMPLANT
KIT ENCORE 26 ADVANTAGE (KITS) ×5 IMPLANT
KIT HEART LEFT (KITS) ×3 IMPLANT
MICROGUIDE FINECROSS 150 CM (MICROCATHETER) ×3
PACK CARDIAC CATHETERIZATION (CUSTOM PROCEDURE TRAY) ×3 IMPLANT
PINNACLE LONG 7F 25CM (SHEATH) ×3
SET INTRODUCER MICROPUNCT 5F (INTRODUCER) ×2 IMPLANT
SHEATH INTRO PINNACLE 7F 25CM (SHEATH) IMPLANT
TRANSDUCER W/STOPCOCK (MISCELLANEOUS) ×3 IMPLANT
TUBING CIL FLEX 10 FLL-RA (TUBING) ×3 IMPLANT
WIRE EMERALD 3MM-J .035X150CM (WIRE) ×2 IMPLANT
WIRE HI TORQ WHISPER MS 300CM (WIRE) ×4 IMPLANT
WIRE MAILMAN 300CM (WIRE) ×2 IMPLANT

## 2016-08-19 NOTE — Progress Notes (Signed)
Pt CBG 202, per HS coverage 2 units novolog ordered.  Pt requested daytime (AC) coverage which would be 7 units.  Paged on call, approved for 6 units novolog coverage.  6 units given.  Will continue to monitor pt closely.

## 2016-08-19 NOTE — Progress Notes (Signed)
ANTICOAGULATION CONSULT NOTE - Follow Up Consult  Pharmacy Consult for Heparin Indication: chest pain/ACS  Assessment: Charles [name] is a 75 yo male who was admitted on 9/3/2017for chest pain. He is now s/p cath with severe 2VCAD in the distal LAD and proximal RCA with PCI planned for today (9/7). Heparin level remains therapeutic today at 0.44. Hemoglobin and platelets are low, but remain stable. No bleeding noted.   Goal of Therapy:  Heparin level 0.3-0.7 units/ml Monitor platelets by anticoagulation protocol: Yes   Plan:  Continue heparin 1400 units/hr Monitor daily heparin level, CBC, and s/sx's bleeding Follow-up post-PCI plans __________________________________________________________________________________________  Allergies  Allergen Reactions  . Xarelto [Rivaroxaban] Other (See Comments)    rash    Patient Measurements: Height: 6\' 4"  (193 cm) Weight: (!) 356 lb 1.6 oz (161.5 kg) IBW/kg (Calculated) : 86.8 Heparin Dosing Weight: 126 kg  Vital Signs: Temp: 97.7 F (36.5 C) (09/07 0546) Temp Source: Oral (09/07 0546) BP: 111/39 (09/07 0546) Pulse Rate: 57 (09/07 0546)  Labs:  Recent Labs  08/17/16 0537 08/18/16 0427 08/18/16 0735 08/19/16 0326  HGB 11.1* 11.0*  --  11.0*  HCT 36.7* 35.8*  --  35.8*  PLT 186 194  --  172  HEPARINUNFRC 0.54  --  0.40 0.44  CREATININE  --  0.89  --   --     Estimated Creatinine Clearance: 120.2 mL/min (by C-G formula based on SCr of 0.89 mg/dL).   Medications:  Infusions:  . sodium chloride 75 mL/hr at 08/19/16 0541  . heparin 1,400 Units/hr (08/19/16 0351)    Belia Heman, PharmD PGY1 Pharmacy Resident [phone] (Pager) 08/19/2016 6:42 AM

## 2016-08-19 NOTE — Progress Notes (Signed)
Site area: RFA Site Prior to Removal:  Level 0 Pressure Applied For:30 min Manual: yes   Patient Status During Pull:  stable Post Pull Site:  Level 0 Post Pull Instructions Given:  yes Post Pull Pulses Present: doppler Dressing Applied:tegaderm   Bedrest begins @ 1700 till 2100  Comments:

## 2016-08-19 NOTE — Progress Notes (Signed)
ANTICOAGULATION CONSULT NOTE - Follow Up Consult Pharmacy Consult for Heparin Indication: chest pain/ACS  Assessment: JP is a 75 yo male who was admitted on 9/3/2017for chest pain. He is now s/p cath with severe 2VCAD in the distal LAD and proximal RCA. Has been maintained on heparin up until cath today that revealed 90% stenosed RCA with unsuccessful PCI.   Pharmacy asked to resume heparin 8 hours post sheath removal which occurred at 14:19 today.   Goal of Therapy:  Heparin level 0.3-0.7 units/ml Monitor platelets by anticoagulation protocol: Yes   Plan:  Restart heparin infusion at 1400 units/hr at 22:00 this evening Heparin level in am, CBC, and s/sx's bleeding  __________________________________________________________________________________________  Allergies  Allergen Reactions  . Xarelto [Rivaroxaban] Other (See Comments)    rash    Patient Measurements: Height: 6\' 4"  (193 cm) Weight: (!) 356 lb 1.6 oz (161.5 kg) IBW/kg (Calculated) : 86.8 Heparin Dosing Weight: 126 kg  Vital Signs: Temp: 97.7 F (36.5 C) (09/07 1829) Temp Source: Axillary (09/07 1829) BP: 140/55 (09/07 1829) Pulse Rate: 81 (09/07 1829)  Labs:  Recent Labs  08/17/16 0537 08/18/16 0427 08/18/16 0735 08/19/16 0326  HGB 11.1* 11.0*  --  11.0*  HCT 36.7* 35.8*  --  35.8*  PLT 186 194  --  172  HEPARINUNFRC 0.54  --  0.40 0.44  CREATININE  --  0.89  --   --     Estimated Creatinine Clearance: 120.2 mL/min (by C-G formula based on SCr of 0.89 mg/dL).   Medications:  Infusions:  . sodium chloride 1 mL/kg/hr (08/19/16 1501)  . heparin      Vincenza Hews, PharmD, BCPS 08/19/2016, 6:39 PM Pager: [phone]

## 2016-08-19 NOTE — Interval H&P Note (Signed)
History and Physical Interval Note:  08/19/2016 12:12 PM  Charles [name]  has presented today for surgery, with the diagnosis of cad  The various methods of treatment have been discussed with the patient and family. After consideration of risks, benefits and other options for treatment, the patient has consented to  Procedure(s): Coronary Stent Intervention (N/A) as a surgical intervention .  The patient's history has been reviewed, patient examined, no change in status, stable for surgery.  I have reviewed the patient's chart and labs.  Questions were answered to the patient's satisfaction.   Cath Lab Visit (complete for each Cath Lab visit)  Clinical Evaluation Leading to the Procedure:   ACS: Yes.    Non-ACS:    Anginal Classification: CCS IV  Anti-ischemic medical therapy: Maximal Therapy (2 or more classes of medications)  Non-Invasive Test Results: No non-invasive testing performed  Prior CABG: No previous CABG       Collier [name] 08/19/2016 12:13 PM

## 2016-08-19 NOTE — H&P (View-Only) (Signed)
Subjective: No CP  NO SOB at rest   Objective: Vitals:   08/17/16 1733 08/17/16 2033 08/18/16 0500 08/18/16 0857  BP: 94/67 (!) 113/55 (!) 108/48 (!) 118/39  Pulse:  66 (!) 50 62  Resp: 17 18 18    Temp:  98 F (36.7 C) 98 F (36.7 C)   TempSrc:  Oral Oral   SpO2:  95% 94%   Weight:      Height:       Weight change:   Intake/Output Summary (Last 24 hours) at 08/18/16 1115 Last data filed at 08/18/16 0900  Gross per 24 hour  Intake            675.6 ml  Output              975 ml  Net           -299.4 ml   I/O 1.1 L negative    General: Alert, awake, oriented x3, in no acute distress Neck:  JVP is difficult to assess Heart: Regular rate and rhythm, without murmurs, rubs, gallops.  Lungs: Clear to auscultation.  No rales or wheezes. Exemities:  1+ edema.   Neuro: Grossly intact, nonfocal.   Lab Results: Results for orders placed or performed during the hospital encounter of 08/15/16 (from the past 24 hour(s))  Glucose, capillary     Status: None   Collection Time: 08/17/16  2:14 PM  Result Value Ref Range   Glucose-Capillary 78 65 - 99 mg/dL  Glucose, capillary     Status: Abnormal   Collection Time: 08/17/16  4:19 PM  Result Value Ref Range   Glucose-Capillary 103 (H) 65 - 99 mg/dL  Glucose, capillary     Status: Abnormal   Collection Time: 08/17/16  8:37 PM  Result Value Ref Range   Glucose-Capillary 171 (H) 65 - 99 mg/dL  Glucose, capillary     Status: Abnormal   Collection Time: 08/17/16 10:59 PM  Result Value Ref Range   Glucose-Capillary 142 (H) 65 - 99 mg/dL  CBC     Status: Abnormal   Collection Time: 08/18/16  4:27 AM  Result Value Ref Range   WBC 4.8 4.0 - 10.5 K/uL   RBC 3.63 (L) 4.22 - 5.81 MIL/uL   Hemoglobin 11.0 (L) 13.0 - 17.0 g/dL   HCT 35.8 (L) 39.0 - 52.0 %   MCV 98.6 78.0 - 100.0 fL   MCH 30.3 26.0 - 34.0 pg   MCHC 30.7 30.0 - 36.0 g/dL   RDW 15.5 11.5 - 15.5 %   Platelets 194 150 - 400 K/uL  Basic metabolic panel     Status:  Abnormal   Collection Time: 08/18/16  4:27 AM  Result Value Ref Range   Sodium 138 135 - 145 mmol/L   Potassium 3.7 3.5 - 5.1 mmol/L   Chloride 101 101 - 111 mmol/L   CO2 26 22 - 32 mmol/L   Glucose, Bld 112 (H) 65 - 99 mg/dL   BUN 14 6 - 20 mg/dL   Creatinine, Ser 0.89 0.61 - 1.24 mg/dL   Calcium 8.0 (L) 8.9 - 10.3 mg/dL   GFR calc non Af Amer >60 >60 mL/min   GFR calc Af Amer >60 >60 mL/min   Anion gap 11 5 - 15  Glucose, capillary     Status: Abnormal   Collection Time: 08/18/16  7:23 AM  Result Value Ref Range   Glucose-Capillary 107 (H) 65 - 99 mg/dL  Heparin level (unfractionated)  Status: None   Collection Time: 08/18/16  7:35 AM  Result Value Ref Range   Heparin Unfractionated 0.40 0.30 - 0.70 IU/mL    Studies/Results: No results found.  Medications:REviewed     @PROBHOSP @  1  CAD  Cath yesterday    Conclusion     Ost LAD to Prox LAD lesion, 20 %stenosed.  Mid LAD lesion, 60 %stenosed.  Dist LAD lesion, 80 %stenosed.  Ost Cx to Prox Cx lesion, 30 %stenosed.  Mid Cx lesion, 30 %stenosed.  Prox RCA lesion, 90 %stenosed.  The left ventricular systolic function is normal.  LV end diastolic pressure is mildly elevated.  The left ventricular ejection fraction is 55-65% by visual estimate.   1. 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 This is chronic and unchanged.    - severe proximal RCA stenosis. This has progressed significantly from 2013 and appears to be the culprit lesion. It is a very complex lesion with a severe Shepherd's crook deformity, heavy calcification and filling defect consistent with thrombus. 2. Overall good LV function   Plan for intervention tomorrow  Keep on heparin  2  PAF  Denied anticoag  On ASA and plavix CHADSVASC of 6    3  LE edemia  Getting lasix daily  Responding      LOS: 3 days   Charles [name] 08/18/2016, 11:15 AM

## 2016-08-20 ENCOUNTER — Telehealth: Payer: Self-pay | Admitting: Internal Medicine

## 2016-08-20 ENCOUNTER — Encounter (HOSPITAL_COMMUNITY): Payer: Self-pay | Admitting: Cardiology

## 2016-08-20 DIAGNOSIS — E1140 Type 2 diabetes mellitus with diabetic neuropathy, unspecified: Secondary | ICD-10-CM | POA: Diagnosis present

## 2016-08-20 DIAGNOSIS — I878 Other specified disorders of veins: Secondary | ICD-10-CM | POA: Diagnosis present

## 2016-08-20 DIAGNOSIS — I5033 Acute on chronic diastolic (congestive) heart failure: Secondary | ICD-10-CM

## 2016-08-20 DIAGNOSIS — E7800 Pure hypercholesterolemia, unspecified: Secondary | ICD-10-CM | POA: Diagnosis present

## 2016-08-20 DIAGNOSIS — R600 Localized edema: Secondary | ICD-10-CM | POA: Diagnosis present

## 2016-08-20 DIAGNOSIS — I5043 Acute on chronic combined systolic (congestive) and diastolic (congestive) heart failure: Secondary | ICD-10-CM

## 2016-08-20 LAB — GLUCOSE, CAPILLARY: GLUCOSE-CAPILLARY: 109 mg/dL — AB (ref 65–99)

## 2016-08-20 LAB — BASIC METABOLIC PANEL
Anion gap: 6 (ref 5–15)
BUN: 16 mg/dL (ref 6–20)
CHLORIDE: 104 mmol/L (ref 101–111)
CO2: 27 mmol/L (ref 22–32)
Calcium: 8.3 mg/dL — ABNORMAL LOW (ref 8.9–10.3)
Creatinine, Ser: 0.9 mg/dL (ref 0.61–1.24)
GFR calc non Af Amer: >60 mL/min (ref >60)
Glucose, Bld: 146 mg/dL — ABNORMAL HIGH (ref 65–99)
POTASSIUM: 4.1 mmol/L (ref 3.5–5.1)
SODIUM: 137 mmol/L (ref 135–145)

## 2016-08-20 LAB — CBC
HCT: 33.9 % — ABNORMAL LOW (ref 39.0–52.0)
HEMOGLOBIN: 10.6 g/dL — AB (ref 13.0–17.0)
MCH: 30.5 pg (ref 26.0–34.0)
MCHC: 31.3 g/dL (ref 30.0–36.0)
MCV: 97.4 fL (ref 78.0–100.0)
Platelets: 183 10*3/uL (ref 150–400)
RBC: 3.48 MIL/uL — AB (ref 4.22–5.81)
RDW: 15.3 % (ref 11.5–15.5)
WBC: 5.7 10*3/uL (ref 4.0–10.5)

## 2016-08-20 MED ORDER — METOPROLOL TARTRATE 25 MG PO TABS: 25.0000 mg | ORAL_TABLET | Freq: Two times a day (BID) | ORAL | 3 refills | Status: DC

## 2016-08-20 MED ORDER — NITROGLYCERIN 0.4 MG SL SUBL: 0.4000 mg | SUBLINGUAL_TABLET | SUBLINGUAL | 3 refills | Status: DC | PRN

## 2016-08-20 MED ORDER — ISOSORBIDE MONONITRATE ER 30 MG PO TB24
30.0000 mg | ORAL_TABLET | Freq: Every day | ORAL | Status: DC
  Administered 2016-08-20: 30 mg via ORAL
  Filled 2016-08-20: qty 1

## 2016-08-20 MED ORDER — POTASSIUM CHLORIDE CRYS ER 20 MEQ PO TBCR: 20.0000 meq | EXTENDED_RELEASE_TABLET | Freq: Every day | ORAL | 3 refills | Status: DC

## 2016-08-20 MED ORDER — ANGIOPLASTY BOOK
Freq: Once | Status: AC
  Administered 2016-08-20: 02:00:00
  Filled 2016-08-20: qty 1

## 2016-08-20 MED ORDER — HEART ATTACK BOUNCING BOOK
Freq: Once | Status: DC
  Filled 2016-08-20: qty 1

## 2016-08-20 MED ORDER — ISOSORBIDE MONONITRATE ER 30 MG PO TB24: 30.0000 mg | ORAL_TABLET | Freq: Every day | ORAL | 3 refills | Status: DC

## 2016-08-20 MED FILL — Verapamil HCl IV Soln 2.5 MG/ML: INTRAVENOUS | Qty: 2 | Status: AC

## 2016-08-20 NOTE — Progress Notes (Signed)
Subjective: No CP  Breathing is OK   Objective: Vitals:   08/19/16 1829 08/19/16 1915 08/19/16 2156 08/20/16 0419  BP: (!) 140/55 (!) 129/52 (!) 109/58 (!) 103/31  Pulse: 81 83 (!) 128 (!) 55  Resp: 20 (!) 22  (!) 27  Temp: 97.7 F (36.5 C) 98.5 F (36.9 C)  97.2 F (36.2 C)  TempSrc: Axillary Oral  Axillary  SpO2: 97%   97%  Weight:    (!) 369 lb 11.4 oz (167.7 kg)  Height:       Weight change: 13 lb 11.4 oz (6.219 kg)  Intake/Output Summary (Last 24 hours) at 08/20/16 0748 Last data filed at 08/19/16 1415  Gross per 24 hour  Intake                3 ml  Output              750 ml  Net             -747 ml   Net neg 5.48 L  General: Alert, awake, oriented x3, in no acute distress Neck:  JVP is normal Heart: Regular rate and rhythm, without murmurs, rubs, gallops.  Lungs: Clear to auscultation.  No rales or wheezes. Exemities:  Tr   edema.   Neuro: Grossly intact, nonfocal.   Lab Results: Results for orders placed or performed during the hospital encounter of 08/15/16 (from the past 24 hour(s))  Glucose, capillary     Status: Abnormal   Collection Time: 08/19/16 11:47 AM  Result Value Ref Range   Glucose-Capillary 108 (H) 65 - 99 mg/dL  POCT Activated clotting time     Status: None   Collection Time: 08/19/16 12:55 PM  Result Value Ref Range   Activated Clotting Time 593 seconds  Glucose, capillary     Status: Abnormal   Collection Time: 08/19/16  2:55 PM  Result Value Ref Range   Glucose-Capillary 118 (H) 65 - 99 mg/dL  Glucose, capillary     Status: Abnormal   Collection Time: 08/19/16  9:32 PM  Result Value Ref Range   Glucose-Capillary 202 (H) 65 - 99 mg/dL  CBC     Status: Abnormal   Collection Time: 08/20/16  2:53 AM  Result Value Ref Range   WBC 5.7 4.0 - 10.5 K/uL   RBC 3.48 (L) 4.22 - 5.81 MIL/uL   Hemoglobin 10.6 (L) 13.0 - 17.0 g/dL   HCT 33.9 (L) 39.0 - 52.0 %   MCV 97.4 78.0 - 100.0 fL   MCH 30.5 26.0 - 34.0 pg   MCHC 31.3 30.0 - 36.0 g/dL    RDW 15.3 11.5 - 15.5 %   Platelets 183 150 - 400 K/uL  Basic metabolic panel     Status: Abnormal   Collection Time: 08/20/16  2:53 AM  Result Value Ref Range   Sodium 137 135 - 145 mmol/L   Potassium 4.1 3.5 - 5.1 mmol/L   Chloride 104 101 - 111 mmol/L   CO2 27 22 - 32 mmol/L   Glucose, Bld 146 (H) 65 - 99 mg/dL   BUN 16 6 - 20 mg/dL   Creatinine, Ser 0.90 0.61 - 1.24 mg/dL   Calcium 8.3 (L) 8.9 - 10.3 mg/dL   GFR calc non Af Amer >60 >60 mL/min   GFR calc Af Amer >60 >60 mL/min   Anion gap 6 5 - 15  Glucose, capillary     Status: Abnormal   Collection Time: 08/20/16  6:06 AM  Result Value Ref Range   Glucose-Capillary 109 (H) 65 - 99 mg/dL    Studies/Results: No results found.  Medications:REviewed    @PROBHOSP @  1  CAD  PTCA attempt yesterday of RCA was unsuccessfult  Pt is not a good candidate for surgery  Will try to maximize medical Rx  Pt was signfi volume overloaded  IV diuresis hs helpded    Will continue PO meds.   Follow BP closely with other meds as outpt   2.  PAF  CHADSVASc score of 6  Pt has refused anticoag (was on coumadin then xarelto in past)  Continue ASA and plavix  3  Acute on chronic diastolic CHF  Volume has improved since admit  I have spoken at length with pt salt/fluid restriction  I would recomm 80 mg lasix daily alt with 40  He has had 80 IV daily here except yesterday and Cr is OK  Plan for poss d/c later today  Ambulate      LOS: 5 days   Dorris Carnes 08/20/2016, 7:48 AM

## 2016-08-20 NOTE — Care Management Note (Addendum)
Case Management Note  Patient Details  Name: Charles [name] MRN: [id number] Date of Birth: Jan 14, 1941  Subjective/Objective:    Patient s/p coronary balloon angioplasty unsuccessful attempt, on plavix, NCM will cont to follow for dc needs.                Action/Plan:   Expected Discharge Date:                  Expected Discharge Plan:  Home/Self Care  In-House Referral:     Discharge planning Services  CM Consult  Post Acute Care Choice:    Choice offered to:     DME Arranged:    DME Agency:     HH Arranged:    HH Agency:     Status of Service:  In process, will continue to follow  If discussed at Long Length of Stay Meetings, dates discussed:    Additional Comments:  Zenon Mayo, RN 08/20/2016, 8:52 AM

## 2016-08-20 NOTE — Telephone Encounter (Signed)
New message       [facility] appt on 08-30-16 with Dr Harrington Challenger per Lisbeth Renshaw

## 2016-08-20 NOTE — Progress Notes (Signed)
CARDIAC REHAB PHASE I   PRE:  Rate/Rhythm: 79 SR c/ occ PVCs  BP:  Sitting: 119/51        SaO2: 98 RA  MODE:  Ambulation: 80 ft   POST:  Rate/Rhythm: 100 SR  BP:  Sitting: 110/60         SaO2: 95 RA  Pt ambulated 80 ft on RA, handheld assist, mildly unsteady gait (pt refused walker, held to hand rail), assist x1, tolerated fair.  Pt c/o moderate DOE, "mild chest pressure", standing rest x1. Completed PCI/CHF education with pt and wife at bedside.  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 Pt states he can only walk longer distances "when pushing a cart." Reviewed risk factors, tobacco cessation (pt reports still using snuff), PCI, anti-platelet therapy, activity restrictions, ntg, exercise (as tolerated and per MD), heart healthy diet, carb counting, portion control,sodium and fluid restriction, daily weights, CHF booklet and zone tool, and phase 2 cardiac rehab. Pt verbalized understanding, needs reinforcement as able. Pt had unsuccessful PCI, no diagnosis of stable angina, therefore pt not a candidate for phase 2 cardiac rehab referral at this time. Pt to recliner after walk, call bell within reach.     [id number] Lenna Sciara, RN, BSN 08/20/2016 10:49 AM

## 2016-08-20 NOTE — Discharge Summary (Signed)
Discharge Summary    Patient ID: Charles [name],  MRN: [id number], DOB/AGE: 75-Jul-1942 75 y.o.  Admit date: 08/15/2016 Discharge date: 08/20/2016  Primary Care Provider: Walker [name] Primary Cardiologist: Dr. Harrington Challenger  Discharge Diagnoses    Principal Problem:   Unstable angina [facility]) Active Problems:   CAD in native artery   Acute on chronic diastolic (congestive) heart failure ([name])   Essential hypertension   History of CVA (cerebrovascular accident)   PAF (paroxysmal atrial fibrillation) ([name])   Venous stasis   Type II diabetes mellitus ([place])   Lower extremity edema   Hypercholesterolemia   Diabetic neuropathy ([name])    Diagnostic Studies/Procedures    1. Diagnostic LHC and attempted PCI - please see full report and below for summary. 2. 2D echo 08/17/16 - - Left ventricle: The cavity size was normal. Systolic function was normal. The estimated ejection fraction was in the range of 55% to 60%. Possible hypokinesis of the apical myocardium. Doppler parameters are consistent with abnormal left ventricular relaxation (grade 1 diastolic dysfunction). Doppler parameters are consistent with high ventricular filling pressure. - Mitral valve: Calcified annulus. - Left atrium: The atrium was mildly dilated. Impressions: - Technically difficult; definity used; probable mild apical hypokinesis with overall preserved LV function; no apical thrombus; grade 1 diastolic dysfunction with elevated LV filling pressure; mild LAE.  _____________   History of Present Illness & Hospital Course    Charles [name] is a 75M with history of CAD (MI ~2000 s/p PTCA of pLAD, NSTEMI 10/2012 with unsuccessful PCI, treated medically), hard of hearing, legally blind, cholelithiasis, CVA, DM c/b neuropathy, ED, HTN, h/o MRSA infection, h/o stomach ulcers, HLD, morbid obesity, OSA on CPAP, chronic leg edema and venous stasis on diuretics, paroxysmal atrial fib (pt refused anticoagulation due to possible rash, cost,  and lawyers ads on TV -> agreed to ASA/Plavix) who presented to [facility] with progressive chest pain and left arm discomfort. He had reported missing a dose of Lopressor and Lasix at home. EKG showed sinus bradycardia with PVC, low voltage, and decreased R wave progression. Symptoms were felt concerning for [place] and he was admitted and started on IV heparin. He was also started on IV Lasix for edema. Troponins peaked at 0.04. LDL 55. A1C 6.3. He underwent LHC 08/17/16 showing severe 2 vessel obstructive disease with severe diffuse distal LAD disease (chronic and unchanged) as well as severe proximal RCA stenosis (This has progressed significantly from 2013 and appears to be the culprit lesion. It is a very complex lesion with a severe Shepherd's crook deformity, heavy calcification and filling defect consistent with thrombus.) LVEF 55-65%. Dr. Martinique planned to review films with colleagues to determine plan for high risk PCI of RCA. He was brought back to the lab 08/19/16 but had unsuccessful PCI of the proximal RCA due to inability to cross with stent or adequate sized balloon. 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 It was felt that if the patient had refractory limiting angina would need to consider for CABG. 2D echo 08/17/16: EF 55-60%, possible HK of the apical myocardium, grade 1 DD, high vent filling pressure, mildly dilated LA. Imdur was added for medical therapy. It appears metoprolol was cut down earlier on in his hospitalization (BP labile, on softer side at times). Potassium was cut to 78meq daily.  With regard to his LEE in addition to venous stasis this was felt due to acute on chronic diastolic CHF.  He was diuresed with IV Lasix. Weights in Epic were very inconsistent - DC weight today was 361.3 with blue jeans, foot boot, regular shoe, and hospital gown on. Dr. Harrington Challenger recommended Lasix 80mg  daily alternating with 40mg  daily. He  ambulated with cardiac rehab today and experienced moderate DOE/mild chest pressure which he and his wife reported as back to his baseline in setting of deconditioning - they wished for discharge today. Dr. Harrington Challenger has seen and examined the patient today and feels he is stable for discharge. He started Imdur today as above and can reassess for further angina as OP.  Labs remained stable except his CBC did show a mild chronic appearing anemia. He was advised to f/u PCP for this. B12 deficiency is listed on his chart.   Consultants: N/A  _____________  Discharge Vitals Blood pressure (!) 127/41, pulse 72, temperature 97.7 F (36.5 C), temperature source Oral, resp. rate 16, height 6\' 4"  (1.93 m), weight (!) 361 lb 4.8 oz (163.9 kg), SpO2 97 %.  Filed Weights   08/19/16 0551 08/20/16 0419 08/20/16 1100  Weight: (!) 356 lb 1.6 oz (161.5 kg) (!) 369 lb 11.4 oz (167.7 kg) (!) 361 lb 4.8 oz (163.9 kg)    Labs & Radiologic Studies    CBC  Recent Labs  08/19/16 0326 08/20/16 0253  WBC 5.5 5.7  HGB 11.0* 10.6*  HCT 35.8* 33.9*  MCV 98.9 97.4  PLT 172 [id number]   Basic Metabolic Panel  Recent Labs  08/18/16 0427 08/20/16 0253  NA 138 137  K 3.7 4.1  CL 101 104  CO2 26 27  GLUCOSE 112* 146*  BUN 14 16  CREATININE 0.89 0.90  CALCIUM 8.0* 8.3*  _____________  Dg Chest 2 View  Result Date: 08/15/2016 CLINICAL DATA:  Congestion for 2 weeks EXAM: CHEST  2 VIEW COMPARISON:  December 26, 2015 FINDINGS: The mediastinal contour is normal. The heart size is mildly enlarged. There are bilateral pleural effusions. Mild increased pulmonary interstitium is identified bilaterally. The bony structures are stable. IMPRESSION: Mild interstitial edema.  Small bilateral pleural effusions. Electronically Signed   By: Abelardo Diesel M.D.   On: 08/15/2016 12:26   Disposition   Pt is being discharged home today in good condition.  Follow-up Plans & [place], MD .     Specialty:  Cardiology Why:  As below - September 18th at [facility] information: [address] [place] [address] [phone]          Discharge Instructions    ([place]) Call MD:  Anytime you have any of the following symptoms: 1) 3 pound weight gain in 24 hours or 5 pounds in 1 week 2) shortness of breath, with or without a dry hacking cough 3) swelling in the hands, feet or stomach 4) if you have to sleep on extra pillows at night in order to breathe.    Complete by:  As directed   Diet - low sodium heart healthy    Complete by:  As directed   Increase activity slowly    Complete by:  As directed   No lifting over 5 lbs for 1 week. No sexual activity for 1 week. Keep procedure site clean & dry. If you notice increased pain, swelling, bleeding or pus, call/return!  You may shower, but no soaking baths/hot tubs/pools for 1 week.   There have been a few medicine changes - please pay  attention to discharge paperwork. Isosorbide is new. Metoprolol and potassium doses have changed.      Discharge Medications     Medication List    STOP taking these medications   amoxicillin-clavulanate 875-125 MG tablet - completed PTA Commonly known as:  AUGMENTIN   doxycycline 100 MG capsule  - completed PTA Commonly known as:  VIBRAMYCIN     TAKE these medications   acetaminophen 325 MG tablet Commonly known as:  TYLENOL Take 2 tablets (650 mg total) by mouth every 6 (six) hours as needed for mild pain, fever or headache (or Fever >/= 101).   aspirin 81 MG tablet Take 81 mg by mouth daily.   clopidogrel 75 MG tablet Commonly known as:  PLAVIX Take 1 tablet (75 mg total) by mouth daily.   EQL VITAMIN D3 1000 units tablet Generic drug:  Cholecalciferol Take 1,000 Units by mouth daily.   erythromycin ophthalmic ointment Place 1 application into the left eye 3 (three) times daily.   Fluticasone-Salmeterol 100-50 MCG/DOSE Aepb Commonly known  as:  ADVAIR DISKUS Inhale 1 puff into the lungs 2 (two) times daily. What changed:  when to take this  reasons to take this   furosemide 80 MG tablet Commonly known as:  LASIX Take 1/2 tablet daily alternating with whole tablet daily   gabapentin 300 MG capsule Commonly known as:  NEURONTIN TAKE 1 CAPSULE THREE TIMES DAILY AS NEEDED FOR NERVE PAIN   glimepiride 4 MG tablet Commonly known as:  AMARYL Take 1 tablet (4 mg total) by mouth 2 (two) times daily.   insulin aspart 100 UNIT/ML FlexPen Commonly known as:  NOVOLOG FLEXPEN Use 5-20 units four times a day after meals and at bedtime Per sliding scale 100-120 cbg   isosorbide mononitrate 30 MG 24 hr tablet **NEW** Commonly known as:  IMDUR Take 1 tablet (30 mg total) by mouth daily.   losartan 100 MG tablet Commonly known as:  COZAAR Take 1 tablet (100 mg total) by mouth daily.   metoprolol tartrate 25 MG tablet Commonly known as:  LOPRESSOR Take 1 tablet (25 mg total) by mouth 2 (two) times daily. What changed:  medication strength  how much to take   nitroGLYCERIN 0.4 MG SL tablet Commonly known as:  NITROSTAT Place 1 tablet (0.4 mg total) under the tongue every 5 (five) minutes as needed up 3 doses. For chest pain   pioglitazone 45 MG tablet Commonly known as:  ACTOS Take 1 tablet (45 mg total) by mouth daily.   potassium chloride SA 20 MEQ tablet Commonly known as:  K-DUR,KLOR-CON Take 1 tablet (20 mEq total) by mouth daily. What changed:  how much to take   pravastatin 40 MG tablet Commonly known as:  PRAVACHOL Take 1 tablet (40 mg total) by mouth daily.   silver sulfADIAZINE 1 % cream Commonly known as:  SILVADENE Apply 1 application topically daily.   triamcinolone cream 0.5 % Commonly known as:  KENALOG Apply 1 application topically 2 (two) times daily as needed (rash).   vitamin B-12 1000 MCG tablet Commonly known as:  CYANOCOBALAMIN Take 1,000 mcg by mouth daily.      Allergies:   Allergies  Allergen Reactions  . Xarelto [Rivaroxaban] Other (See Comments)    rash   Outstanding Labs/Studies   N/A  Duration of Discharge Encounter   Greater than 30 minutes including physician time.  Signed, Nedra Hai Dunn PA-C 08/20/2016, 12:00 PM   Patient seen examined  Stable for discharge  F/U as outpt.  Dorris Carnes

## 2016-08-23 NOTE — Telephone Encounter (Signed)
Patient contacted regarding discharge from [facility] on 08/20/16.  Patient understands to follow up with provider on 08/30/16 at 9:00 at [facility] office.  Patient understands discharge instructions?  Yes  Patient understands medications and regiment?  Yes  Patient understands to bring all medications to this visit?  Yes.  States he has too many medications to bring all of them, but will make sure he has his list so that he may review them, again, with [place] at next weeks office visit.  Other details:

## 2016-08-24 ENCOUNTER — Encounter: Payer: Self-pay | Admitting: Gastroenterology

## 2016-08-29 NOTE — Progress Notes (Signed)
Cardiology Office Note   Date:  08/30/2016   ID:  Charles [name], DOB March 07, 1941, MRN [id number]  PCP:  [name], MD  Cardiologist:   Dorris Carnes, MD   F/U of CAD       History of Present Illness: Charles [name] is a 75 y.o. male with a history of CAD 9s/p MI in 2000), HTN, CVA, HL and afib  I saw him earlier this month   Admitted with [place]  Cath showed high grade severe 2 vessel obstructive disease with severe diffuse distal LAD disease (chronic and unchanged) as well as severe proximal RCA stenosis (This has progressed significantly from 2013 and appears to be the culprit lesion. It is a very complex lesion with a severe Shepherd's crook deformity, heavy calcification and filling defect consistent with thrombus.) LVEF 55-65%. Dr. Martinique planned to review films with colleagues to determine plan for high risk PCI of RCA. He was brought back to the lab 08/19/16 but had unsuccessful PCI of the proximal RCA due to inability to cross with stent or adequate sized balloon. 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 It was felt that if the patient had refractory limiting angina would need to consider for CABG  Since he left the hosp he says he has not had CP   Watching fluids and salt Held lasix today only     Outpatient Medications Prior to Visit  Medication Sig Dispense Refill  . acetaminophen (TYLENOL) 325 MG tablet Take 2 tablets (650 mg total) by mouth every 6 (six) hours as needed for mild pain, fever or headache (or Fever >/= 101).    Marland Kitchen aspirin 81 MG tablet Take 81 mg by mouth daily.    . Cholecalciferol (EQL VITAMIN D3) 1000 UNITS tablet Take 1,000 Units by mouth daily.      . clopidogrel (PLAVIX) 75 MG tablet Take 1 tablet (75 mg total) by mouth daily. 90 tablet 3  . erythromycin ophthalmic ointment Place 1 application into the left eye 3 (three) times daily. 3.5 g 1  . Fluticasone-Salmeterol (ADVAIR DISKUS)  100-50 MCG/DOSE AEPB Inhale 1 puff into the lungs 2 (two) times daily. (Patient taking differently: Inhale 1 puff into the lungs 2 (two) times daily as needed (for shortness of breath). ) 3 each 3  . furosemide (LASIX) 80 MG tablet Take 1/2 tablet daily alternating with whole tablet daily (Patient taking differently: Take 40-80 mg by mouth See admin instructions. Take 1/2 tablet daily alternating with whole tablet daily) 90 tablet 3  . gabapentin (NEURONTIN) 300 MG capsule TAKE 1 CAPSULE THREE TIMES DAILY AS NEEDED FOR NERVE PAIN (Patient taking differently: Take 300 mg by mouth 2 (two) times daily. ) 270 capsule 1  . glimepiride (AMARYL) 4 MG tablet Take 1 tablet (4 mg total) by mouth 2 (two) times daily. 180 tablet 3  . insulin aspart (NOVOLOG FLEXPEN) 100 UNIT/ML FlexPen Use 5-20 units four times a day after meals and at bedtime Per sliding scale 100-120 cbg (Patient taking differently: Inject 3-12 Units into the skin See admin instructions. Use 3-12 units four times a day after meals and at bedtime as needed per sliding scale 100-120 cbg) 75 mL 3  . isosorbide mononitrate (IMDUR) 30 MG 24 hr tablet Take 1 tablet (30 mg total) by mouth daily. 30 tablet 3  . losartan (COZAAR) 100 MG tablet Take 1 tablet (100 mg total) by mouth daily. 90 tablet  3  . metoprolol (LOPRESSOR) 25 MG tablet Take 1 tablet (25 mg total) by mouth 2 (two) times daily. 60 tablet 3  . nitroGLYCERIN (NITROSTAT) 0.4 MG SL tablet Place 1 tablet (0.4 mg total) under the tongue every 5 (five) minutes as needed (up to 3 doses). For chest pain 25 tablet 3  . pioglitazone (ACTOS) 45 MG tablet Take 1 tablet (45 mg total) by mouth daily. 90 tablet 3  . potassium chloride SA (K-DUR,KLOR-CON) 20 MEQ tablet Take 1 tablet (20 mEq total) by mouth daily. 30 tablet 3  . pravastatin (PRAVACHOL) 40 MG tablet Take 1 tablet (40 mg total) by mouth daily. 90 tablet 3  . silver sulfADIAZINE (SILVADENE) 1 % cream Apply 1 application topically daily. 50 g  2  . triamcinolone cream (KENALOG) 0.5 % Apply 1 application topically 2 (two) times daily as needed (rash). 30 g 3  . vitamin B-12 (CYANOCOBALAMIN) 1000 MCG tablet Take 1,000 mcg by mouth daily.       No facility-administered medications prior to visit.      Allergies:   Xarelto [rivaroxaban]   Past Medical History:  Diagnosis Date  . Arthritis   . B12 deficiency   . CAD (coronary artery disease)    a. Approx. 2000 - MI. Cath: single vsl dz, PTCA dLAD/medical rx. ;  b. NSTEMI 11/13 => IVUS attempted for RCA but not successful; anatomy felt stable from 2000 => med Rx. c. [place] 08/2016: severe stable diffuse dLAD, severe prox RCA -> PCI of RCA unsuccessful, consider CABG for refractory angina.  . Cholelithiasis   . Chronic diastolic CHF (congestive heart failure) ([place])   . Claustrophobia   . CVA (cerebral vascular accident) ([place]) ~ 2002  . Diabetic neuropathy ([place])   . Diverticulitis   . ED (erectile dysfunction)   . Essential hypertension   . History of MRSA infection    Right foot  . History of stomach ulcers   . HOH (hard of hearing)   . Hypercholesterolemia   . Legally blind   . Lower extremity edema   . Morbid obesity ([place])   . OSA on CPAP   . PAF (paroxysmal atrial fibrillation) ([name])    a. confirmed by event monitor. b. 02/2014 rash on Coumadin, patient decided to discontinue Xarelto due to possible rash, cost and lawyers ads on TV, agreed to take Plavix.  [place] Plan vision    Since stroke  . Type II diabetes mellitus ([place])   . Venous stasis     Past Surgical History:  Procedure Laterality Date  . CARDIAC CATHETERIZATION  1990's  . CARDIAC CATHETERIZATION N/A 08/17/2016   Procedure: Left Heart Cath and Coronary Angiography;  Surgeon: Peter M Martinique, MD;  Location: [place] CV LAB;  Service: Cardiovascular;  Laterality: N/A;  . CARDIAC CATHETERIZATION N/A 08/19/2016   Procedure: Coronary Balloon Angioplasty;  Surgeon: Peter M Martinique, MD;  Location: [place] CV LAB;   Service: Cardiovascular;  Laterality: N/A;  . CEREBRAL ANGIOGRAM  ~ 2000  . LEFT HEART CATHETERIZATION WITH CORONARY ANGIOGRAM N/A 10/27/2012   Procedure: LEFT HEART CATHETERIZATION WITH CORONARY ANGIOGRAM;  Surgeon: Burnell Blanks, MD;  Location: [facility] CATH LAB;  Service: Cardiovascular;  Laterality: N/A;  . TOE AMPUTATION  2006; 2009   "Dr. Blenda Mounts; big toe left foot; little toe on right foot" (10/26/2012)  . TOE AMPUTATION Right 12/24/2015   2nd & 3 toes/notes 1/13//2017     Social History:  The patient  reports that he has  quit smoking. His smoking use included Cigarettes and Cigars. He has a 30.00 pack-year smoking history. His smokeless tobacco use includes Snuff. He reports that he drinks alcohol. He reports that he does not use drugs.   Family History:  The patient's family history includes Breast cancer (age of onset: 66) in his mother; Heart disease (age of onset: 50) in his father.    ROS:  Please see the history of present illness. All other systems are reviewed and  Negative to the above problem except as noted.    PHYSICAL EXAM: VS:  BP 118/60   Pulse 69   Ht 6\' 4"  (1.93 m)   Wt (!) 353 lb 6.4 oz (160.3 kg)   SpO2 95%   BMI 43.02 kg/m   GEN: Well nourished, well developed, in no acute distress HEENT: normal Neck: no JVD, carotid bruits, or masses Cardiac: RRR; no murmurs, rubs, or gallops, Tr+ edema  Respiratory:  clear to auscultation bilaterally, normal work of breathing GI: soft, nontender, nondistended, + BS  No hepatomegaly  MS: no deformity Moving all extremities   Skin: warm and dry, no rash Neuro:  Strength and sensation are intact Psych: euthymic mood, full affect   EKG:  EKG is not ordered today.   Lipid Panel    Component Value Date/Time   CHOL 98 08/16/2016 0448   TRIG 49 08/16/2016 0448   TRIG 73 12/22/2006 0827   HDL 33 (L) 08/16/2016 0448   CHOLHDL 3.0 08/16/2016 0448   VLDL 10 08/16/2016 0448   LDLCALC 55 08/16/2016 0448      Wt  Readings from Last 3 Encounters:  08/30/16 (!) 353 lb 6.4 oz (160.3 kg)  08/20/16 (!) 361 lb 4.8 oz (163.9 kg)  06/02/16 (!) 362 lb (164.2 kg)      ASSESSMENT AND PLAN: 1  CAD  Results of recent cath as noted above  Option for revasc would be surgery  For now he is doing OK  Imdur was added  His volume was up on admit and it seems that this contrib to symptoms  Now, with fluid down, LV stress is down   I would keep on same regimn  Take an extra half of lasix as needed  Check labs today  2  Chronic diastoic CHF  Volume status up a little but better than he was on this recent admit    F?U in winter     Current medicines are reviewed at length with the patient today.  The patient does not have concerns regarding medicines.  The following changes have been made:   Labs/ tests ordered today include: No orders of the defined types were placed in this encounter.    Disposition:   FU with  in   Signed, Dorris Carnes, MD  08/30/2016 9:40 AM    [address], [place], [place]  [address] Phone: [phone]; Fax: [phone]

## 2016-08-30 ENCOUNTER — Ambulatory Visit (INDEPENDENT_AMBULATORY_CARE_PROVIDER_SITE_OTHER): Payer: Medicare Other | Admitting: Internal Medicine

## 2016-08-30 ENCOUNTER — Ambulatory Visit (INDEPENDENT_AMBULATORY_CARE_PROVIDER_SITE_OTHER): Payer: Medicare Other | Admitting: Podiatry

## 2016-08-30 ENCOUNTER — Encounter: Payer: Self-pay | Admitting: Podiatry

## 2016-08-30 ENCOUNTER — Encounter: Payer: Self-pay | Admitting: Internal Medicine

## 2016-08-30 VITALS — BP 118/60 | HR 69 | Ht 76.0 in | Wt 353.4 lb

## 2016-08-30 DIAGNOSIS — R609 Edema, unspecified: Secondary | ICD-10-CM | POA: Diagnosis not present

## 2016-08-30 DIAGNOSIS — I2510 Atherosclerotic heart disease of native coronary artery without angina pectoris: Secondary | ICD-10-CM

## 2016-08-30 DIAGNOSIS — L97522 Non-pressure chronic ulcer of other part of left foot with fat layer exposed: Secondary | ICD-10-CM

## 2016-08-30 DIAGNOSIS — L89891 Pressure ulcer of other site, stage 1: Secondary | ICD-10-CM

## 2016-08-30 DIAGNOSIS — L97529 Non-pressure chronic ulcer of other part of left foot with unspecified severity: Secondary | ICD-10-CM

## 2016-08-30 DIAGNOSIS — I200 Unstable angina: Secondary | ICD-10-CM | POA: Diagnosis not present

## 2016-08-30 DIAGNOSIS — E11621 Type 2 diabetes mellitus with foot ulcer: Secondary | ICD-10-CM

## 2016-08-30 LAB — BASIC METABOLIC PANEL
BUN: 25 mg/dL (ref 7–25)
CALCIUM: 9.3 mg/dL (ref 8.6–10.3)
CHLORIDE: 100 mmol/L (ref 98–110)
CO2: 30 mmol/L (ref 20–31)
Creat: 1.17 mg/dL (ref 0.70–1.18)
Glucose, Bld: 155 mg/dL — ABNORMAL HIGH (ref 65–99)
POTASSIUM: 4.9 mmol/L (ref 3.5–5.3)
SODIUM: 137 mmol/L (ref 135–146)

## 2016-08-30 LAB — BRAIN NATRIURETIC PEPTIDE: Brain Natriuretic Peptide: 84.5 pg/mL (ref <100)

## 2016-08-30 NOTE — Patient Instructions (Signed)
Your physician recommends that you continue on your current medications as directed. Please refer to the Current Medication list given to you today. Your physician recommends that you return for lab work TODAY (BMET, BNP) Your physician recommends that you schedule a follow-up appointment in: AS NOTED.

## 2016-08-31 DIAGNOSIS — L97529 Non-pressure chronic ulcer of other part of left foot with unspecified severity: Secondary | ICD-10-CM | POA: Insufficient documentation

## 2016-08-31 NOTE — Progress Notes (Signed)
Patient ID: Charles [name], male   DOB: 07-12-1941, 75 y.o.   MRN: [id number]  Subjective: Charles [name] is a 75 y.o. is seen today in office today for follow-up evaluation of submetatarsal one wound on the left foot.They have been applying collagen and silver to the wound daily and the wife believes the wound is looking better, although slow. She denies any drainage or pus coming from the area. Denies any swelling redness or red streaks. Denies any systemic complaints such as fevers, chills, nausea, vomiting. No calf pain, chest pain, shortness of breath.   Objective: General: No acute distress, AAOx3  DP/PT pulses palpable, CRT < 3 sec to all digits.  Motor function intact.  Left foot: Previous hallux amputation. Submetatarsal one ulceration with hyperkeratotic periwound the left foot. Upon debridement the wound measures 0.8 x 0.5 x 0.2 cm. There is no probing to bone, undermining or tunneling. There is no surrounding erythema or ascending cellulitis. There is no fluctuance or crepitus. No malodor. No other open lesions or pre-ulcerative lesions are identified today. Overall the wound appears to be of the same size compared to previous however does appear to be more granular and healthy in nature. No pain with calf compression, swelling, warmth, erythema.   Assessment and Plan:  Left foot submetatarsal 1 ulceration   -Treatment options discussed including all alternatives, risks, and complications -Hyperkeratotic tissue/wound was sharply debrided to granular tissue with a scalpel. Continue collagen silver dressing to the wound daily.  -Offloading  -Monitor for any clinical signs or symptoms of infection and directed to call the office immediately should any occur or go to the ER. -Follow-up in 3 weeks or sooner if any problems arise. In the meantime, encouraged to call the office with any questions, concerns, change in symptoms.   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

## 2016-09-01 ENCOUNTER — Encounter: Payer: Self-pay | Admitting: Internal Medicine

## 2016-09-01 ENCOUNTER — Ambulatory Visit (INDEPENDENT_AMBULATORY_CARE_PROVIDER_SITE_OTHER): Payer: Medicare Other | Admitting: Internal Medicine

## 2016-09-01 DIAGNOSIS — E662 Morbid (severe) obesity with alveolar hypoventilation: Secondary | ICD-10-CM

## 2016-09-01 DIAGNOSIS — I2510 Atherosclerotic heart disease of native coronary artery without angina pectoris: Secondary | ICD-10-CM | POA: Diagnosis not present

## 2016-09-01 DIAGNOSIS — I200 Unstable angina: Secondary | ICD-10-CM

## 2016-09-01 DIAGNOSIS — E1165 Type 2 diabetes mellitus with hyperglycemia: Secondary | ICD-10-CM

## 2016-09-01 DIAGNOSIS — E113399 Type 2 diabetes mellitus with moderate nonproliferative diabetic retinopathy without macular edema, unspecified eye: Secondary | ICD-10-CM

## 2016-09-01 DIAGNOSIS — E538 Deficiency of other specified B group vitamins: Secondary | ICD-10-CM

## 2016-09-01 DIAGNOSIS — IMO0002 Reserved for concepts with insufficient information to code with codable children: Secondary | ICD-10-CM

## 2016-09-01 DIAGNOSIS — Z23 Encounter for immunization: Secondary | ICD-10-CM | POA: Diagnosis not present

## 2016-09-01 DIAGNOSIS — Z794 Long term (current) use of insulin: Secondary | ICD-10-CM

## 2016-09-01 DIAGNOSIS — E7800 Pure hypercholesterolemia, unspecified: Secondary | ICD-10-CM

## 2016-09-01 MED ORDER — CYANOCOBALAMIN 1000 MCG/ML IJ SOLN
1000.0000 ug | Freq: Once | INTRAMUSCULAR | Status: AC
  Administered 2016-09-01: 1000 ug via INTRAMUSCULAR

## 2016-09-01 NOTE — Progress Notes (Signed)
Pre visit review using our clinic review tool, if applicable. No additional management support is needed unless otherwise documented below in the visit note. 

## 2016-09-01 NOTE — Assessment & Plan Note (Signed)
On B12 

## 2016-09-01 NOTE — Assessment & Plan Note (Signed)
On Pravachol 

## 2016-09-01 NOTE — Assessment & Plan Note (Addendum)
Worse Wt loss need discussed

## 2016-09-01 NOTE — Assessment & Plan Note (Signed)
Glimepiride, Actos, Humalog KwikPen SS

## 2016-09-01 NOTE — Addendum Note (Signed)
Addended by: Cresenciano Lick on: 09/01/2016 04:28 PM   Modules accepted: Orders

## 2016-09-01 NOTE — Assessment & Plan Note (Signed)
RCA stenosis - med management Isosorbide added Hosp notes reviewed

## 2016-09-01 NOTE — Assessment & Plan Note (Signed)
Dr Harrington Challenger On Rx: Losartan, Lopressor, Lasix, Pravachol, Plavix, ASA, Isosorbide Pt lost wt on a low carb diet

## 2016-09-01 NOTE — Progress Notes (Signed)
Subjective:  Patient ID: Charles [name], male    DOB: 1941-07-10  Age: 75 y.o. MRN: [id number]  CC: No chief complaint on file.   HPI Charles [name] presents for a post-hosp f/u for [place] w/attempted RCA PCA: d/c'd on meds on 08/20/16 - no bad CP. F/u DM, HTN, obesity  Outpatient Medications Prior to Visit  Medication Sig Dispense Refill  . acetaminophen (TYLENOL) 325 MG tablet Take 2 tablets (650 mg total) by mouth every 6 (six) hours as needed for mild pain, fever or headache (or Fever >/= 101).    Marland Kitchen aspirin 81 MG tablet Take 81 mg by mouth daily.    . Cholecalciferol (EQL VITAMIN D3) 1000 UNITS tablet Take 1,000 Units by mouth daily.      . clopidogrel (PLAVIX) 75 MG tablet Take 1 tablet (75 mg total) by mouth daily. 90 tablet 3  . erythromycin ophthalmic ointment Place 1 application into the left eye 3 (three) times daily. 3.5 g 1  . Fluticasone-Salmeterol (ADVAIR DISKUS) 100-50 MCG/DOSE AEPB Inhale 1 puff into the lungs 2 (two) times daily. (Patient taking differently: Inhale 1 puff into the lungs 2 (two) times daily as needed (for shortness of breath). ) 3 each 3  . furosemide (LASIX) 80 MG tablet Take 1/2 tablet daily alternating with whole tablet daily (Patient taking differently: Take 40-80 mg by mouth See admin instructions. Take 1/2 tablet daily alternating with whole tablet daily) 90 tablet 3  . gabapentin (NEURONTIN) 300 MG capsule TAKE 1 CAPSULE THREE TIMES DAILY AS NEEDED FOR NERVE PAIN (Patient taking differently: Take 300 mg by mouth 2 (two) times daily. ) 270 capsule 1  . glimepiride (AMARYL) 4 MG tablet Take 1 tablet (4 mg total) by mouth 2 (two) times daily. 180 tablet 3  . insulin aspart (NOVOLOG FLEXPEN) 100 UNIT/ML FlexPen Use 5-20 units four times a day after meals and at bedtime Per sliding scale 100-120 cbg (Patient taking differently: Inject 3-12 Units into the skin See admin instructions. Use 3-12 units four times a day after meals and at bedtime as needed per sliding  scale 100-120 cbg) 75 mL 3  . isosorbide mononitrate (IMDUR) 30 MG 24 hr tablet Take 1 tablet (30 mg total) by mouth daily. 30 tablet 3  . losartan (COZAAR) 100 MG tablet Take 1 tablet (100 mg total) by mouth daily. 90 tablet 3  . metoprolol (LOPRESSOR) 25 MG tablet Take 1 tablet (25 mg total) by mouth 2 (two) times daily. 60 tablet 3  . nitroGLYCERIN (NITROSTAT) 0.4 MG SL tablet Place 1 tablet (0.4 mg total) under the tongue every 5 (five) minutes as needed (up to 3 doses). For chest pain 25 tablet 3  . pioglitazone (ACTOS) 45 MG tablet Take 1 tablet (45 mg total) by mouth daily. 90 tablet 3  . potassium chloride SA (K-DUR,KLOR-CON) 20 MEQ tablet Take 1 tablet (20 mEq total) by mouth daily. 30 tablet 3  . pravastatin (PRAVACHOL) 40 MG tablet Take 1 tablet (40 mg total) by mouth daily. 90 tablet 3  . silver sulfADIAZINE (SILVADENE) 1 % cream Apply 1 application topically daily. 50 g 2  . triamcinolone cream (KENALOG) 0.5 % Apply 1 application topically 2 (two) times daily as needed (rash). 30 g 3  . vitamin B-12 (CYANOCOBALAMIN) 1000 MCG tablet Take 1,000 mcg by mouth daily.       No facility-administered medications prior to visit.     ROS Review of Systems  Constitutional: Negative for appetite change,  fatigue and unexpected weight change.  HENT: Negative for congestion, nosebleeds, sneezing, sore throat and trouble swallowing.   Eyes: Negative for itching and visual disturbance.  Respiratory: Negative for cough and wheezing.   Cardiovascular: Negative for chest pain, palpitations and leg swelling.  Gastrointestinal: Negative for abdominal distention, blood in stool, diarrhea and nausea.  Genitourinary: Negative for frequency and hematuria.  Musculoskeletal: Negative for back pain, gait problem, joint swelling and neck pain.  Skin: Negative for rash.  Neurological: Negative for dizziness, tremors, speech difficulty and weakness.  Psychiatric/Behavioral: Negative for agitation, dysphoric  mood and sleep disturbance. The patient is not nervous/anxious.   mild CPs  Objective:  BP 140/62   Pulse 69   Temp 98.2 F (36.8 C) (Oral)   Wt (!) 355 lb (161 kg)   SpO2 98%   BMI 43.21 kg/m   BP Readings from Last 3 Encounters:  09/01/16 140/62  08/30/16 118/60  08/20/16 (!) 127/41    Wt Readings from Last 3 Encounters:  09/01/16 (!) 355 lb (161 kg)  08/30/16 (!) 353 lb 6.4 oz (160.3 kg)  08/20/16 (!) 361 lb 4.8 oz (163.9 kg)    Physical Exam  Constitutional: He is oriented to person, place, and time. He appears well-developed. No distress.  NAD  HENT:  Mouth/Throat: Oropharynx is clear and moist.  Eyes: Conjunctivae are normal. Pupils are equal, round, and reactive to light.  Neck: Normal range of motion. No JVD present. No thyromegaly present.  Cardiovascular: Normal rate, regular rhythm, normal heart sounds and intact distal pulses.  Exam reveals no gallop and no friction rub.   No murmur heard. Pulmonary/Chest: Effort normal and breath sounds normal. No respiratory distress. He has no wheezes. He has no rales. He exhibits no tenderness.  Abdominal: Soft. Bowel sounds are normal. He exhibits no distension and no mass. There is no tenderness. There is no rebound and no guarding.  Musculoskeletal: Normal range of motion. He exhibits no edema or tenderness.  Lymphadenopathy:    He has no cervical adenopathy.  Neurological: He is alert and oriented to person, place, and time. He has normal reflexes. No cranial nerve deficit. He exhibits normal muscle tone. He displays a negative Romberg sign. Coordination abnormal. Gait normal.  Skin: Skin is warm and dry. No rash noted.  Psychiatric: He has a normal mood and affect. His behavior is normal. Judgment and thought content normal.  Cane Obese  Lab Results  Component Value Date   WBC 5.7 08/20/2016   HGB 10.6 (L) 08/20/2016   HCT 33.9 (L) 08/20/2016   PLT 183 08/20/2016   GLUCOSE 155 (H) 08/30/2016   CHOL 98  08/16/2016   TRIG 49 08/16/2016   HDL 33 (L) 08/16/2016   LDLCALC 55 08/16/2016   ALT 11 (L) 12/28/2015   AST 19 12/28/2015   NA 137 08/30/2016   K 4.9 08/30/2016   CL 100 08/30/2016   CREATININE 1.17 08/30/2016   BUN 25 08/30/2016   CO2 30 08/30/2016   TSH 1.19 04/18/2015   PSA 0.96 08/28/2010   INR 1.11 08/15/2016   HGBA1C 6.3 (H) 08/16/2016   MICROALBUR 1.4 08/28/2010    Dg Chest 2 View  Result Date: 08/15/2016 CLINICAL DATA:  Congestion for 2 weeks EXAM: CHEST  2 VIEW COMPARISON:  December 26, 2015 FINDINGS: The mediastinal contour is normal. The heart size is mildly enlarged. There are bilateral pleural effusions. Mild increased pulmonary interstitium is identified bilaterally. The bony structures are stable. IMPRESSION: Mild interstitial edema.  Small bilateral pleural effusions. Electronically Signed   By: Abelardo Diesel M.D.   On: 08/15/2016 12:26    Assessment & Plan:   There are no diagnoses linked to this encounter. I am having Mr. Polman maintain his vitamin B-12, Cholecalciferol, acetaminophen, silver sulfADIAZINE, triamcinolone cream, aspirin, Fluticasone-Salmeterol, insulin aspart, clopidogrel, furosemide, gabapentin, glimepiride, losartan, pioglitazone, pravastatin, erythromycin, isosorbide mononitrate, metoprolol tartrate, potassium chloride SA, and nitroGLYCERIN.  No orders of the defined types were placed in this encounter.    Follow-up: No Follow-up on file.  [name], MD

## 2016-09-16 DIAGNOSIS — E119 Type 2 diabetes mellitus without complications: Secondary | ICD-10-CM | POA: Diagnosis not present

## 2016-09-16 DIAGNOSIS — H25813 Combined forms of age-related cataract, bilateral: Secondary | ICD-10-CM | POA: Diagnosis not present

## 2016-09-20 ENCOUNTER — Ambulatory Visit (INDEPENDENT_AMBULATORY_CARE_PROVIDER_SITE_OTHER): Payer: Medicare Other | Admitting: Podiatry

## 2016-09-20 ENCOUNTER — Encounter: Payer: Self-pay | Admitting: Podiatry

## 2016-09-20 ENCOUNTER — Ambulatory Visit (INDEPENDENT_AMBULATORY_CARE_PROVIDER_SITE_OTHER): Payer: Medicare Other

## 2016-09-20 DIAGNOSIS — I200 Unstable angina: Secondary | ICD-10-CM | POA: Diagnosis not present

## 2016-09-20 DIAGNOSIS — L97514 Non-pressure chronic ulcer of other part of right foot with necrosis of bone: Secondary | ICD-10-CM | POA: Diagnosis not present

## 2016-09-20 DIAGNOSIS — L02611 Cutaneous abscess of right foot: Secondary | ICD-10-CM

## 2016-09-20 DIAGNOSIS — L97529 Non-pressure chronic ulcer of other part of left foot with unspecified severity: Principal | ICD-10-CM

## 2016-09-20 DIAGNOSIS — E11621 Type 2 diabetes mellitus with foot ulcer: Secondary | ICD-10-CM | POA: Diagnosis not present

## 2016-09-20 DIAGNOSIS — L97512 Non-pressure chronic ulcer of other part of right foot with fat layer exposed: Secondary | ICD-10-CM

## 2016-09-20 DIAGNOSIS — L03031 Cellulitis of right toe: Secondary | ICD-10-CM

## 2016-09-20 DIAGNOSIS — L89891 Pressure ulcer of other site, stage 1: Secondary | ICD-10-CM | POA: Diagnosis not present

## 2016-09-20 MED ORDER — CLINDAMYCIN HCL 300 MG PO CAPS: 300.0000 mg | ORAL_CAPSULE | Freq: Three times a day (TID) | ORAL | 2 refills | Status: DC

## 2016-09-20 MED ORDER — CIPROFLOXACIN HCL 500 MG PO TABS: 500.0000 mg | ORAL_TABLET | Freq: Two times a day (BID) | ORAL | 0 refills | Status: DC

## 2016-09-20 NOTE — Progress Notes (Signed)
Patient ID: Charles [name], male   DOB: January 25, 1941, 75 y.o.   MRN: [id number]  Subjective: Charles [name] is a 75 y.o. is seen today in office today for concerns of a new wound on the top of his right foot on the 4th toe which he first noticed yesterday. He states it has been somewhat swollen. He did have doxycycline at home and he started to take this. Denies any pus or any red streaking. No other complaints at this time. Denies any systemic complaints such as fevers, chills, nausea, vomiting. No calf pain, chest pain, shortness of breath.   Objective: General: No acute distress, AAOx3  DP/PT pulses palpable, CRT < 3 sec to all digits.  Motor function intact.  Left foot: Previous hallux amputation. Submetatarsal one ulceration with hyperkeratotic periwound the left foot. Upon debridement the wound measures 0.8 x 0.5 x 0.2 cm, about the same as previous. There is no probing to bone, undermining or tunneling. There is no surrounding erythema or ascending cellulitis. There is no fluctuance or crepitus. No malodor.  Right foot: The dorsal aspect of the PIPJ of the toe is an ulceration which probes very close to bone. There is localized edema and erythema to the toe without any fluctuance or crepitus or ascending cellulitis. There is no drainage or pus expressed there is no malodor. There is rotation of the toe which is chronic and not new. No other open lesions or pre-ulcerative lesions are identified today. No pain with calf compression, swelling, warmth, erythema.   Assessment and Plan:  Left foot submetatarsal 1 ulceration with new ulceration right fourth toe with localized cellulitis  -Treatment options discussed including all alternatives, risks, and complications -Left foot wound sharply debrided today to granular wound base. Continue daily dressing changes. There is no sign of infection to the left foot at this time. -X-rays obtained and reviewed. No definitive evidence of acute changes  suggestive of osteomyelitis in the right foot at this time. -Wound sharply debrided stated healthy wound base. There is no pus expressed and there is no exposed bone although the wound does probe close to bone. We will start clindamycin and ciprofloxacin. Iodosorb was applied followed by a bandage. Continue with daily dressing changes as discussed. Offloading shoe. Discussed with him possible amputation. Monitor for any signs or symptoms of infection including ER should any arise if any worsening. Follow up in 1 week or sooner if needed. Call any questions or concerns in the meantime.  Celesta Gentile, DPM   Cultures/Pathology 12/24/15 from right foot surgery: Pathology (from [facility]) 3rd toe- osteomyelitis; margin viable 3rd toe margin- no OM 2nd toe- no acute OM; inflammation, ulcer, necrosis 2nd toe margin- No OM   Microbiology (from [facility]) 2nd toe margin- no growth  3rd toe margin- no growth   Celesta Gentile, DPM

## 2016-09-21 ENCOUNTER — Other Ambulatory Visit: Payer: Medicare Other

## 2016-09-27 ENCOUNTER — Ambulatory Visit (INDEPENDENT_AMBULATORY_CARE_PROVIDER_SITE_OTHER): Payer: Medicare Other | Admitting: Podiatry

## 2016-09-27 ENCOUNTER — Encounter: Payer: Self-pay | Admitting: Podiatry

## 2016-09-27 DIAGNOSIS — I200 Unstable angina: Secondary | ICD-10-CM

## 2016-09-27 DIAGNOSIS — M86171 Other acute osteomyelitis, right ankle and foot: Secondary | ICD-10-CM | POA: Diagnosis not present

## 2016-09-27 DIAGNOSIS — L97522 Non-pressure chronic ulcer of other part of left foot with fat layer exposed: Secondary | ICD-10-CM

## 2016-09-27 MED ORDER — CIPROFLOXACIN HCL 500 MG PO TABS: 500.0000 mg | ORAL_TABLET | Freq: Two times a day (BID) | ORAL | 1 refills | Status: DC

## 2016-09-28 NOTE — Progress Notes (Signed)
Patient ID: Charles [name], male   DOB: 12-Mar-1941, 75 y.o.   MRN: [id number]  Subjective: Charles [name] is a 75 y.o. is seen today in office today for follow-up evaluation of wound to the right fourth toe. He states that he feels the toe is still infected. It is getting better views notices still red and swollen. He has continue with antibiotics but he needs a refill the Cipro. He is continued offloading shoe. Denies any pus from the toe.  No other complaints at this time. Denies any systemic complaints such as fevers, chills, nausea, vomiting. No calf pain, chest pain, shortness of breath.   Objective: General: No acute distress, AAOx3  DP/PT pulses palpable, CRT < 3 sec to all digits.  Motor function intact.  Left foot: Previous hallux amputation. Submetatarsal one ulceration with hyperkeratotic periwound the left foot. Upon debridement the wound measures 0.8 x 0.5 x 0.2 cm, about the same as previous. There is no probing to bone, undermining or tunneling. There is no surrounding erythema or ascending cellulitis. There is no fluctuance or crepitus. No malodor.  Right foot: The dorsal aspect of the PIPJ of the toe is an ulceration with exposed bone of the proximal phalanx. There is localized edema and erythema to the toe without any fluctuance or crepitus or ascending cellulitis. This appears to be about the same compared to last appointment.There is no drainage or pus expressed there is no malodor. No other open lesions or pre-ulcerative lesions are identified today. No pain with calf compression, swelling, warmth, erythema.   Assessment and Plan:  Left foot submetatarsal 1 ulceration with ulceration and exposed bone.   -Treatment options discussed including all alternatives, risks, and complications -At this time given the continued edema and erythema to the toe on the right side as well as exposed bone recommend amputation. As he has had other toe amputations at this point I recommended  transmetatarsal amputation to help prevent other issues as the only toe that would remain would be the big toe. He had agreement to this. Continue with antibiotics. Hopeful perform the amputation next week and we will go ahead and schedule handbell see him back in the office on Monday prior to the surgery for surgical consent. Left foot was also debrided today. No signs of infection the left foot. Continue daily dressing changes as discussed. Discussed with him that after surgery to the right foot he will be nonweightbearing or to weightbearing to the heel which is most likely benefit more pressure to the left side severely currently very careful with that side. He presented today with his son who also had no other questions.  Celesta Gentile, DPM

## 2016-10-04 ENCOUNTER — Ambulatory Visit (INDEPENDENT_AMBULATORY_CARE_PROVIDER_SITE_OTHER): Payer: Medicare Other | Admitting: Podiatry

## 2016-10-04 DIAGNOSIS — L97522 Non-pressure chronic ulcer of other part of left foot with fat layer exposed: Secondary | ICD-10-CM

## 2016-10-04 DIAGNOSIS — I200 Unstable angina: Secondary | ICD-10-CM

## 2016-10-04 DIAGNOSIS — M86171 Other acute osteomyelitis, right ankle and foot: Secondary | ICD-10-CM | POA: Diagnosis not present

## 2016-10-04 NOTE — Patient Instructions (Signed)

## 2016-10-05 ENCOUNTER — Telehealth: Payer: Self-pay | Admitting: *Deleted

## 2016-10-05 NOTE — Telephone Encounter (Signed)
"  This is Tammy from [facility].  We are not going to be able to do his surgery here.  It will have to be done at [facility].  I tried to call yesterday but you were out of the office.  I wanted to give you enough heads up."  Okay we will get it rescheduled.  Thank you.    I called and spoke to Darrin Luis scheduler.  I asked her to move surgery from [facility] 10/08/2016 to [facility] on 10/13/2016.  She stated no time was available at this time.  The rooms had not been released.  She stated the reserved spots are normally not released until 72 hours prior to date.  She asked that I call back to see if it was available then.

## 2016-10-08 ENCOUNTER — Telehealth: Payer: Self-pay | Admitting: *Deleted

## 2016-10-08 NOTE — Telephone Encounter (Signed)
Pt wife is calling regarding husband Surgery is scheduled on 10/13/16 needs to have clearance

## 2016-10-08 NOTE — Progress Notes (Signed)
Patient ID: Charles [name], male   DOB: March 24, 1941, 75 y.o.   MRN: [id number]  Subjective: Charles [name] is a 75 y.o. is seen today in office today for follow-up evaluation of wound to the right fourth toe. His wife states that she has been able to see the bone to the right fourth toe as well. He presents today for follow-up evaluation of the limited to discuss surgical intervention. He states the toe is still red and swollen but denies any red streaks. He denies any pus coming from the wound. On the left foot he states the wound has continued with his notices any redness, swelling or any drainage. Denies any systemic complaints such as fevers, chills, nausea, vomiting. No calf pain, chest pain, shortness of breath.   Objective: General: No acute distress, AAOx3  DP/PT pulses palpable, CRT < 3 sec to all digits.  Motor function intact.  Left foot: Previous hallux amputation. Submetatarsal one ulceration with hyperkeratotic periwound the left foot. Upon debridement the wound measures 0.8 x 0.5 x 0.2 cm, about the same as previous. There is no probing to bone, undermining or tunneling. There is no surrounding erythema or ascending cellulitis. There is no fluctuance or crepitus. No malodor.  Right foot: The dorsal aspect of the PIPJ of the toe is an ulceration with exposed bone of the proximal phalanx. There is localized edema and erythema to the toe without any fluctuance or crepitus or ascending cellulitis. This appears to be about the same compared to last appointment.There is no drainage or pus expressed there is no malodor. Overall exam is unchanged. No other open lesions or pre-ulcerative lesions are identified today. No pain with calf compression, swelling, warmth, erythema.   Assessment and Plan:  Left foot submetatarsal 1 ulceration with ulceration and exposed bone.   -Treatment options discussed including all alternatives, risks, and complications -Left side continue with daily dressing  changes as previous. Monitor for any signs or symptoms of infection. -On the right foot given the osteomyelitis due to the exposed bone of fourth toe and previous history of amputation to the other toes I recommended transmetatarsal amputation. I discussed the surgery as well as the risks and postoperative course with him and his wife. They wish to go ahead and proceed with surgery. The incision placement as well as the postoperative course was discussed with the patient. I discussed risks of the surgery which include, but not limited to, infection, bleeding, pain, swelling, need for further surgery, delayed or nonhealing, painful or ugly scar, numbness or sensation changes, over/under correction, recurrence, transfer lesions, further deformity, hardware failure, DVT/PE, loss of toe/foot. Patient understands these risks and wishes to proceed with surgery. The surgical consent was reviewed with the patient all 3 pages were signed. No promises or guarantees were given to the outcome of the procedure. All questions were answered to the best of my ability. Before the surgery the patient was encouraged to call the office if there is any further questions. The surgery will be performed at the [facility] on an outpatient basis.  Celesta Gentile, DPM

## 2016-10-08 NOTE — Telephone Encounter (Signed)
Spoke with patient's wife.  She states surgeon (Dr. Jacqualyn Posey at [facility]) recommends patient be off Plavix for 5-7 days prior to surgery.  He is scheduled for Wed, Oct 13, 2016.    He takes Plavix at night.   Advised that if possible, when Dr. Harrington Challenger reviews this message, she may be able to call and provide instruction on stopping Plavix over the weekend, provided she is able to clear him for the surgery.  Patient's wife is appreciative for any assistance and said that Dr. Harrington Challenger can call anytime of day or night.

## 2016-10-08 NOTE — Telephone Encounter (Signed)
Received papers from patient --[place] and Dr. Jacqualyn Posey.   Pt is pending a transmetatarsal amputation/achilles lengthening for osteomyelitis.  The request is for an H &P by Dr Harrington Challenger as well as cardiac clearance and advisement on stopping Plavix.  Forwarding to Dr. Harrington Challenger (who is out of the office today) to review.  Pt was last seen by MD on 9/18, after recent hospitalization.

## 2016-10-08 NOTE — Telephone Encounter (Signed)
I am calling to inform you that Mr. Charles [name] is scheduled for surgery on Wednesday of next week at [facility].  We have not received anything from Dr. Harrington Challenger regarding medical clearance.  You may want to contact them again to check on the status.  "I told them it was of high importance when I dropped it off.  I am going to have him stop the Plavix today myself.  He has done it in the past 5 days before."  We cannot give him the okay to stop it.  "I know, I will call Dr. Alan Ripper office now.  What time does he need to be there?"  His surgery is at 1 pm.  They will call with arrival time.  It may be 1/1.5 hours before surgery time.  Someone from [facility] will call you with the time 1-2 days prior to surgery date.  Please keep [place] informed of the status of medical clearance.

## 2016-10-12 ENCOUNTER — Encounter (HOSPITAL_BASED_OUTPATIENT_CLINIC_OR_DEPARTMENT_OTHER): Payer: Self-pay | Admitting: *Deleted

## 2016-10-12 ENCOUNTER — Telehealth: Payer: Self-pay | Admitting: *Deleted

## 2016-10-12 ENCOUNTER — Telehealth: Payer: Self-pay | Admitting: Internal Medicine

## 2016-10-12 NOTE — Telephone Encounter (Signed)
"  Calling to see if you have received medical clearance on this patient.  His surgery is scheduled for tomorrow."

## 2016-10-12 NOTE — Telephone Encounter (Signed)
Will fax information to Dr. Leigh Aurora office.

## 2016-10-12 NOTE — Telephone Encounter (Signed)
Patient discharged from hospital in September.  Has severe CAD  No successful intervention done at that admit   He is at moderate increased risk for ischemic event with surgery Watch IV fluids closely  Avoid fluid overload.   Watch BP.

## 2016-10-12 NOTE — Telephone Encounter (Signed)
I called Charles [name] to see if Dr. Harrington Challenger filled out the history and physical form and to see if she gave medical clearance.  She stated she has not heard anything.  She stated she has called and spoke to the nurse, Network engineer, and administrator and no one has been able to tell her anything.  I informed her that I would call and check on the status with Dr. Harrington Challenger.  I called Dr. Harrington Challenger' office.  I was informed she was not in the office yesterday nor today. She stated a request cannot be initiated unless the staff contacts the office.  I informed Web designer that patient's wife delivered the history and physical form from our office requesting history and physical form be completed and faxed last week.  Patient is scheduled for a Transmetatarsal Amputation right foot on tomorrow.  Patient will not be able to have the surgery unless the form is completed and sent to the surgical center.  She stated she would get the request to the nurse and someone would call me back today.

## 2016-10-12 NOTE — Telephone Encounter (Signed)
Request for surgical clearance:  1. What type of surgery is being performed? Amputation toes of right foot  When is this surgery scheduled?10/13/16 Are there any medications that need to be held prior to surgery and how long? plavix  2. Name of physician performing surgery? Dr. Jacqualyn Posey  3. What is your office phone and fax number? [phone]     Fax [phone]

## 2016-10-12 NOTE — Telephone Encounter (Signed)
OK to hold or d/c plavix per surgery recommendations.

## 2016-10-12 NOTE — Telephone Encounter (Signed)
"  I just sent out a call to Dr. Harrington Challenger.  I am waiting on a response.  I see where you all have been calling.  Were you wanting him to hold the Plavix?"  Yes, we would have liked for him to have stopped it prior to surgery.  Dr. Jacqualyn Posey may be able to do still do it and just put a drain in.  "Okay, I will see if I can reach Dr. Harrington Challenger, she may still be seeing patients.  I'll give you a call back.  You may have to postpone surgery."  We have postponed it already.  Patient was supposed to have had it last week.  His foot is infected we need to take care of it right away.

## 2016-10-12 NOTE — Telephone Encounter (Signed)
Office needs Dr. Harrington Challenger to say whether or not pt needs to hold Plavix in this note.

## 2016-10-12 NOTE — Progress Notes (Signed)
Charles [name] had his wife take pre op call. Plavix has not been stopped. I instructed Mrs Christians to hold Glimepride tonight. Check CBG in am if < 70 take 1/2 cup of clear juice (apple or grape), recheck CBG in 15 minutes.  If CBG remains low call pre- op.  I asked Mrs Tellman to check CBG every 2 hours until he arrives to pre op.   Mrs Okin reported that Isosorbide is a new prescription and she will not give it to him in am, she is concerned that it might drop blood sugar too much.

## 2016-10-13 ENCOUNTER — Ambulatory Visit (HOSPITAL_COMMUNITY): Payer: Medicare Other | Admitting: Emergency Medicine

## 2016-10-13 ENCOUNTER — Encounter (HOSPITAL_COMMUNITY): Payer: Self-pay | Admitting: Surgery

## 2016-10-13 ENCOUNTER — Ambulatory Visit (HOSPITAL_BASED_OUTPATIENT_CLINIC_OR_DEPARTMENT_OTHER)
Admission: RE | Admit: 2016-10-13 | Discharge: 2016-10-13 | Disposition: A | Discharge status: Home / Self Care | Payer: Medicare Other | Source: Ambulatory Visit | Attending: Podiatry | Admitting: Podiatry

## 2016-10-13 ENCOUNTER — Encounter (HOSPITAL_COMMUNITY)
Admission: RE | Disposition: A | Discharge status: Home / Self Care | Payer: Self-pay | Source: Ambulatory Visit | Attending: Podiatry

## 2016-10-13 ENCOUNTER — Ambulatory Visit (HOSPITAL_COMMUNITY): Payer: Medicare Other

## 2016-10-13 DIAGNOSIS — I252 Old myocardial infarction: Secondary | ICD-10-CM | POA: Insufficient documentation

## 2016-10-13 DIAGNOSIS — Z09 Encounter for follow-up examination after completed treatment for conditions other than malignant neoplasm: Secondary | ICD-10-CM

## 2016-10-13 DIAGNOSIS — M869 Osteomyelitis, unspecified: Secondary | ICD-10-CM | POA: Diagnosis not present

## 2016-10-13 DIAGNOSIS — I2510 Atherosclerotic heart disease of native coronary artery without angina pectoris: Secondary | ICD-10-CM | POA: Insufficient documentation

## 2016-10-13 DIAGNOSIS — M868X4 Other osteomyelitis, hand: Secondary | ICD-10-CM | POA: Insufficient documentation

## 2016-10-13 DIAGNOSIS — Z87891 Personal history of nicotine dependence: Secondary | ICD-10-CM | POA: Insufficient documentation

## 2016-10-13 DIAGNOSIS — I110 Hypertensive heart disease with heart failure: Secondary | ICD-10-CM | POA: Insufficient documentation

## 2016-10-13 DIAGNOSIS — S98911A Complete traumatic amputation of right foot, level unspecified, initial encounter: Secondary | ICD-10-CM | POA: Diagnosis not present

## 2016-10-13 DIAGNOSIS — E119 Type 2 diabetes mellitus without complications: Secondary | ICD-10-CM | POA: Diagnosis not present

## 2016-10-13 DIAGNOSIS — G4730 Sleep apnea, unspecified: Secondary | ICD-10-CM | POA: Diagnosis not present

## 2016-10-13 DIAGNOSIS — I509 Heart failure, unspecified: Secondary | ICD-10-CM | POA: Insufficient documentation

## 2016-10-13 DIAGNOSIS — Z8673 Personal history of transient ischemic attack (TIA), and cerebral infarction without residual deficits: Secondary | ICD-10-CM | POA: Diagnosis not present

## 2016-10-13 DIAGNOSIS — Z89429 Acquired absence of other toe(s), unspecified side: Secondary | ICD-10-CM | POA: Diagnosis not present

## 2016-10-13 DIAGNOSIS — I5033 Acute on chronic diastolic (congestive) heart failure: Secondary | ICD-10-CM | POA: Diagnosis not present

## 2016-10-13 DIAGNOSIS — M86671 Other chronic osteomyelitis, right ankle and foot: Secondary | ICD-10-CM | POA: Diagnosis not present

## 2016-10-13 DIAGNOSIS — J449 Chronic obstructive pulmonary disease, unspecified: Secondary | ICD-10-CM | POA: Insufficient documentation

## 2016-10-13 DIAGNOSIS — M868X7 Other osteomyelitis, ankle and foot: Secondary | ICD-10-CM | POA: Diagnosis not present

## 2016-10-13 DIAGNOSIS — E1169 Type 2 diabetes mellitus with other specified complication: Secondary | ICD-10-CM | POA: Diagnosis not present

## 2016-10-13 HISTORY — DX: Cardiac arrhythmia, unspecified: I49.9

## 2016-10-13 HISTORY — DX: Polyneuropathy, unspecified: G62.9

## 2016-10-13 HISTORY — PX: TRANSMETATARSAL AMPUTATION: SHX6197

## 2016-10-13 HISTORY — DX: Pneumonia, unspecified organism: J18.9

## 2016-10-13 HISTORY — DX: Malignant (primary) neoplasm, unspecified: C80.1

## 2016-10-13 HISTORY — DX: Unspecified cataract: H26.9

## 2016-10-13 HISTORY — PX: CATARACT EXTRACTION: SUR2

## 2016-10-13 LAB — BASIC METABOLIC PANEL
ANION GAP: 7 (ref 5–15)
BUN: 13 mg/dL (ref 6–20)
CHLORIDE: 104 mmol/L (ref 101–111)
CO2: 26 mmol/L (ref 22–32)
Calcium: 8.7 mg/dL — ABNORMAL LOW (ref 8.9–10.3)
Creatinine, Ser: 0.9 mg/dL (ref 0.61–1.24)
Glucose, Bld: 106 mg/dL — ABNORMAL HIGH (ref 65–99)
POTASSIUM: 4.5 mmol/L (ref 3.5–5.1)
SODIUM: 137 mmol/L (ref 135–145)

## 2016-10-13 LAB — GLUCOSE, CAPILLARY
GLUCOSE-CAPILLARY: 107 mg/dL — AB (ref 65–99)
Glucose-Capillary: 106 mg/dL — ABNORMAL HIGH (ref 65–99)

## 2016-10-13 LAB — CBC
HCT: 34.8 % — ABNORMAL LOW (ref 39.0–52.0)
HEMOGLOBIN: 11.4 g/dL — AB (ref 13.0–17.0)
MCH: 31.3 pg (ref 26.0–34.0)
MCHC: 32.8 g/dL (ref 30.0–36.0)
MCV: 95.6 fL (ref 78.0–100.0)
PLATELETS: 283 10*3/uL (ref 150–400)
RBC: 3.64 MIL/uL — AB (ref 4.22–5.81)
RDW: 15.6 % — ABNORMAL HIGH (ref 11.5–15.5)
WBC: 4.4 10*3/uL (ref 4.0–10.5)

## 2016-10-13 SURGERY — AMPUTATION, FOOT, TRANSMETATARSAL
Anesthesia: Monitor Anesthesia Care | Laterality: Right

## 2016-10-13 MED ORDER — LACTATED RINGERS IV SOLN
INTRAVENOUS | Status: DC | PRN
  Administered 2016-10-13 (×2): via INTRAVENOUS

## 2016-10-13 MED ORDER — PROPOFOL 500 MG/50ML IV EMUL
INTRAVENOUS | Status: DC | PRN
  Administered 2016-10-13: 40 ug/kg/min via INTRAVENOUS

## 2016-10-13 MED ORDER — DEXTROSE 5 % IV SOLN
3.0000 g | INTRAVENOUS | Status: AC
  Administered 2016-10-13: 3 g via INTRAVENOUS
  Filled 2016-10-13: qty 3000

## 2016-10-13 MED ORDER — MIDAZOLAM HCL 5 MG/ML IJ SOLN: 2.0000 mg | Freq: Once | INTRAMUSCULAR | Status: DC

## 2016-10-13 MED ORDER — FENTANYL CITRATE (PF) 100 MCG/2ML IJ SOLN
INTRAMUSCULAR | Status: AC
  Filled 2016-10-13: qty 2

## 2016-10-13 MED ORDER — OXYCODONE HCL 5 MG/5ML PO SOLN: 5.0000 mg | Freq: Once | ORAL | Status: DC | PRN

## 2016-10-13 MED ORDER — PROPOFOL 10 MG/ML IV BOLUS
INTRAVENOUS | Status: AC
  Filled 2016-10-13: qty 20

## 2016-10-13 MED ORDER — EPHEDRINE SULFATE 50 MG/ML IJ SOLN
INTRAMUSCULAR | Status: DC | PRN
  Administered 2016-10-13: 10 mg via INTRAVENOUS

## 2016-10-13 MED ORDER — CHLORHEXIDINE GLUCONATE CLOTH 2 % EX PADS: 6.0000 | MEDICATED_PAD | Freq: Once | CUTANEOUS | Status: DC

## 2016-10-13 MED ORDER — 0.9 % SODIUM CHLORIDE (POUR BTL) OPTIME
TOPICAL | Status: DC | PRN
  Administered 2016-10-13 (×2): 1000 mL

## 2016-10-13 MED ORDER — OXYCODONE-ACETAMINOPHEN 5-325 MG PO TABS: 1.0000 | ORAL_TABLET | Freq: Four times a day (QID) | ORAL | 0 refills | Status: DC | PRN

## 2016-10-13 MED ORDER — OXYCODONE HCL 5 MG PO TABS: 5.0000 mg | ORAL_TABLET | Freq: Once | ORAL | Status: DC | PRN

## 2016-10-13 MED ORDER — FENTANYL CITRATE (PF) 100 MCG/2ML IJ SOLN: 100.0000 ug | Freq: Once | INTRAMUSCULAR | Status: DC

## 2016-10-13 MED ORDER — FENTANYL CITRATE (PF) 100 MCG/2ML IJ SOLN
INTRAMUSCULAR | Status: DC | PRN
  Administered 2016-10-13: 25 ug via INTRAVENOUS

## 2016-10-13 MED ORDER — MIDAZOLAM HCL 2 MG/2ML IJ SOLN
INTRAMUSCULAR | Status: AC
  Filled 2016-10-13: qty 2

## 2016-10-13 MED ORDER — CEFAZOLIN SODIUM 1 G IJ SOLR
INTRAMUSCULAR | Status: AC
  Filled 2016-10-13: qty 30

## 2016-10-13 MED ORDER — MIDAZOLAM HCL 5 MG/ML IJ SOLN
2.0000 mg | Freq: Once | INTRAMUSCULAR | Status: AC
  Administered 2016-10-13: 2 mg via INTRAVENOUS

## 2016-10-13 MED ORDER — PHENYLEPHRINE HCL 10 MG/ML IJ SOLN
INTRAMUSCULAR | Status: DC | PRN
  Administered 2016-10-13: 80 ug via INTRAVENOUS

## 2016-10-13 MED ORDER — LIDOCAINE HCL (CARDIAC) 20 MG/ML IV SOLN
INTRAVENOUS | Status: DC | PRN
  Administered 2016-10-13: 60 mg via INTRAVENOUS
  Administered 2016-10-13: 50 mg via INTRAVENOUS

## 2016-10-13 MED ORDER — FENTANYL CITRATE (PF) 100 MCG/2ML IJ SOLN
100.0000 ug | Freq: Once | INTRAMUSCULAR | Status: AC
  Administered 2016-10-13: 50 ug via INTRAVENOUS

## 2016-10-13 MED ORDER — ONDANSETRON HCL 4 MG/2ML IJ SOLN: 4.0000 mg | Freq: Four times a day (QID) | INTRAMUSCULAR | Status: DC | PRN

## 2016-10-13 MED ORDER — FENTANYL CITRATE (PF) 100 MCG/2ML IJ SOLN: 25.0000 ug | INTRAMUSCULAR | Status: DC | PRN

## 2016-10-13 MED ORDER — PROMETHAZINE HCL 25 MG PO TABS: 25.0000 mg | ORAL_TABLET | Freq: Three times a day (TID) | ORAL | 0 refills | Status: DC | PRN

## 2016-10-13 MED ORDER — ROPIVACAINE HCL 7.5 MG/ML IJ SOLN
INTRAMUSCULAR | Status: DC | PRN
  Administered 2016-10-13: 15 mL via PERINEURAL
  Administered 2016-10-13: 20 mL via PERINEURAL

## 2016-10-13 SURGICAL SUPPLY — 49 items
BANDAGE ACE 4X5 VEL STRL LF (GAUZE/BANDAGES/DRESSINGS) ×2 IMPLANT
BANDAGE ESMARK 6X9 LF (GAUZE/BANDAGES/DRESSINGS) IMPLANT
BLADE AVERAGE 25MMX9MM (BLADE) ×1
BLADE AVERAGE 25X9 (BLADE) ×1 IMPLANT
BLADE SAW RECIP 87.9 MT (BLADE) IMPLANT
BLADE SURG 10 STRL SS (BLADE) ×3 IMPLANT
BNDG CMPR 9X6 STRL LF SNTH (GAUZE/BANDAGES/DRESSINGS)
BNDG COHESIVE 4X5 TAN STRL (GAUZE/BANDAGES/DRESSINGS) ×3 IMPLANT
BNDG ESMARK 6X9 LF (GAUZE/BANDAGES/DRESSINGS)
BNDG GAUZE ELAST 4 BULKY (GAUZE/BANDAGES/DRESSINGS) ×2 IMPLANT
CUFF TOURNIQUET SINGLE 34IN LL (TOURNIQUET CUFF) IMPLANT
CUFF TOURNIQUET SINGLE 44IN (TOURNIQUET CUFF) IMPLANT
DRAPE U-SHAPE 47X51 STRL (DRAPES) ×6 IMPLANT
DRSG PAD ABDOMINAL 8X10 ST (GAUZE/BANDAGES/DRESSINGS) ×4 IMPLANT
DURAPREP 26ML APPLICATOR (WOUND CARE) ×3 IMPLANT
ELECT REM PT RETURN 9FT ADLT (ELECTROSURGICAL) ×3
ELECTRODE REM PT RTRN 9FT ADLT (ELECTROSURGICAL) ×1 IMPLANT
GAUZE SPONGE 4X4 12PLY STRL (GAUZE/BANDAGES/DRESSINGS) ×3 IMPLANT
GAUZE XEROFORM 1X8 LF (GAUZE/BANDAGES/DRESSINGS) ×2 IMPLANT
GAUZE XEROFORM 5X9 LF (GAUZE/BANDAGES/DRESSINGS) ×3 IMPLANT
GLOVE BIO SURGEON STRL SZ8 (GLOVE) ×3 IMPLANT
GLOVE BIOGEL PI IND STRL 8 (GLOVE) ×1 IMPLANT
GLOVE BIOGEL PI INDICATOR 8 (GLOVE) ×2
GLOVE ORTHO TXT STRL SZ7.5 (GLOVE) ×3 IMPLANT
GOWN STRL REUS W/ TWL LRG LVL3 (GOWN DISPOSABLE) ×1 IMPLANT
GOWN STRL REUS W/ TWL XL LVL3 (GOWN DISPOSABLE) ×4 IMPLANT
GOWN STRL REUS W/TWL LRG LVL3 (GOWN DISPOSABLE) ×3
GOWN STRL REUS W/TWL XL LVL3 (GOWN DISPOSABLE) ×12
KIT BASIN OR (CUSTOM PROCEDURE TRAY) ×3 IMPLANT
KIT ROOM TURNOVER OR (KITS) ×3 IMPLANT
NS IRRIG 1000ML POUR BTL (IV SOLUTION) ×5 IMPLANT
PACK ORTHO EXTREMITY (CUSTOM PROCEDURE TRAY) ×3 IMPLANT
PAD ARMBOARD 7.5X6 YLW CONV (MISCELLANEOUS) ×6 IMPLANT
PAD CAST 4YDX4 CTTN HI CHSV (CAST SUPPLIES) ×1 IMPLANT
PADDING CAST ABS 4INX4YD NS (CAST SUPPLIES) ×2
PADDING CAST ABS COTTON 4X4 ST (CAST SUPPLIES) IMPLANT
PADDING CAST COTTON 4X4 STRL (CAST SUPPLIES) ×3
SPONGE LAP 18X18 X RAY DECT (DISPOSABLE) ×6 IMPLANT
STAPLER VISISTAT 35W (STAPLE) ×3 IMPLANT
STOCKINETTE IMPERVIOUS LG (DRAPES) IMPLANT
SUT ETHILON 2 0 PSLX (SUTURE) ×6 IMPLANT
SUT ETHILON 4 0 PS 2 18 (SUTURE) ×2 IMPLANT
SUT PROLENE 3 0 CT 1 (SUTURE) ×4 IMPLANT
TOWEL OR 17X24 6PK STRL BLUE (TOWEL DISPOSABLE) ×3 IMPLANT
TOWEL OR 17X26 10 PK STRL BLUE (TOWEL DISPOSABLE) ×3 IMPLANT
TUBE CONNECTING 12'X1/4 (SUCTIONS) ×1
TUBE CONNECTING 12X1/4 (SUCTIONS) ×2 IMPLANT
WATER STERILE IRR 1000ML POUR (IV SOLUTION) ×3 IMPLANT
YANKAUER SUCT BULB TIP NO VENT (SUCTIONS) ×3 IMPLANT

## 2016-10-13 NOTE — Anesthesia Procedure Notes (Signed)
Procedure Name: MAC Date/Time: 10/13/2016 2:21 PM Performed by: Neldon Newport Pre-anesthesia Checklist: Timeout performed, Patient being monitored, Suction available, Emergency Drugs available and Patient identified Patient Re-evaluated:Patient Re-evaluated prior to inductionOxygen Delivery Method: Nasal cannula Placement Confirmation: positive ETCO2

## 2016-10-13 NOTE — Brief Op Note (Signed)
10/13/2016  3:23 PM  PATIENT:  Charles [name]  75 y.o. male  PRE-OPERATIVE DIAGNOSIS:  OSTEOMYELITIS  POST-OPERATIVE DIAGNOSIS:  OSTEOMYELITIS  PROCEDURE:  Procedure(s): TRANSMETATARSAL AMPUTATION (Right)  SURGEON:  Surgeon(s) and Role:    * Trula Slade, DPM - Primary  PHYSICIAN ASSISTANT:   ASSISTANTS: none   ANESTHESIA:   regional and IV sedation  EBL:  Total I/O In: 1000 [I.V.:1000] Out: 100 [Blood:100]  BLOOD ADMINISTERED:none  DRAINS: (Right foot) Jackson-Pratt drain(s) with closed bulb suction in the right   LOCAL MEDICATIONS USED:  NONE  SPECIMEN:  Source of Specimen:  foot pathology  DISPOSITION OF SPECIMEN:  PATHOLOGY  COUNTS:  YES  TOURNIQUET:  * No tourniquets in log *  DICTATION: .Dragon Dictation  PLAN OF CARE: Discharge to home after PACU  PATIENT DISPOSITION:  PACU - hemodynamically stable.   Delay start of Pharmacological VTE agent (>24hrs) due to surgical blood loss or risk of bleeding: no

## 2016-10-13 NOTE — Anesthesia Procedure Notes (Signed)
Anesthesia Regional Block:  Popliteal block  Pre-Anesthetic Checklist: ,, timeout performed, Correct Patient, Correct Site, Correct Laterality, Correct Procedure, Correct Position, site marked, Risks and benefits discussed,  Surgical consent,  Pre-op evaluation,  At surgeon's request and post-op pain management  Laterality: Right  Prep: chloraprep       Needles:  Injection technique: Single-shot  Needle Type: Echogenic Stimulator Needle     Needle Length:cm 9 cm Needle Gauge: 21 G    Additional Needles:  Procedures: ultrasound guided (picture in chart) and nerve stimulator Popliteal block  Nerve Stimulator or Paresthesia:  Response: plantar flexion of foot, 0.45 mA,   Additional Responses:   Narrative:  Start time: 10/13/2016 1:18 PM End time: 10/13/2016 1:28 PM Injection made incrementally with aspirations every 5 mL.  Performed by: Personally  Anesthesiologist: Mekenzie Modeste  Additional Notes: Functioning IV was confirmed and monitors were applied.  A 68mm 21ga Arrow echogenic stimulator needle was used. Sterile prep and drape,hand hygiene and sterile gloves were used.  Negative aspiration and negative test dose prior to incremental administration of local anesthetic (20 mL 0.75% Ropivacaine). The patient tolerated the procedure well.  Ultrasound guidance: relevent anatomy identified, needle position confirmed, local anesthetic spread visualized around nerve(s), vascular puncture avoided.  Image printed for medical record.   Right adductor canal block also performed.  Sterile technique.  Ultrasound guidance.  15 mL of 0.75 Ropivacaine injected at this site.

## 2016-10-13 NOTE — Discharge Instructions (Signed)
CONTINUE ALL MEDICATIONS AD BEFORE CONTINUE YOUR ANTIBIOTICS DO NOT PUT WEIGHT ON YOUR RIGHT FOOT SEE WRITTEN INSTRUCTIONS

## 2016-10-13 NOTE — Telephone Encounter (Signed)
Faxed this encounter to [facility] at [phone], attentio [place].

## 2016-10-13 NOTE — Anesthesia Preprocedure Evaluation (Addendum)
Anesthesia Evaluation  Patient identified by MRN, date of birth, ID band Patient awake    Reviewed: Allergy & Precautions, NPO status , Patient's Chart, lab work & pertinent test results  Airway Mallampati: II   Neck ROM: full    Dental   Pulmonary sleep apnea , COPD, former smoker,    breath sounds clear to auscultation       Cardiovascular hypertension, + CAD, + Past MI and +CHF  + dysrhythmias Atrial Fibrillation  Rhythm:regular Rate:Normal  Determined to be moderate risk for ischemic cardiac event perioperatively by his cardiologist, Dr Harrington Challenger.   Neuro/Psych Anxiety Legally blind. CVA    GI/Hepatic   Endo/Other  diabetes, Type 2  Renal/GU      Musculoskeletal  (+) Arthritis ,   Abdominal   Peds  Hematology   Anesthesia Other Findings   Reproductive/Obstetrics                            Anesthesia Physical Anesthesia Plan  ASA: IV  Anesthesia Plan: MAC and Regional   Post-op Pain Management:    Induction: Intravenous  Airway Management Planned: Simple Face Mask  Additional Equipment:   Intra-op Plan:   Post-operative Plan:   Informed Consent: I have reviewed the patients History and Physical, chart, labs and discussed the procedure including the risks, benefits and alternatives for the proposed anesthesia with the patient or authorized representative who has indicated his/her understanding and acceptance.     Plan Discussed with: CRNA, Anesthesiologist and Surgeon  Anesthesia Plan Comments:         Anesthesia Quick Evaluation

## 2016-10-13 NOTE — Progress Notes (Signed)
Orthopedic Tech Progress Note Patient Details:  Charles [name] 11-May-1941 [id number]  Ortho Devices Type of Ortho Device: CAM walker Ortho Device/Splint Location: rle Ortho Device/Splint Interventions: Application   [name] 10/13/2016, 4:22 PM

## 2016-10-13 NOTE — H&P (Signed)
Limited to H&P of the lower extremity  Subjective: Patient presents to the hospital for surgical infection of right foot infection. He is scheduled for a TMA. He has had multiple toe amputations and now with infection and exposed bone to the right 4th toe. Because of this discussed TMA with him, his on, and his wife. He wishes to proceed. Denies any systemic complaints such as fevers, chills, nausea, vomiting. No acute changes since last appointment, and no other complaints at this time.   Objective: AAO x3, NAD DP/PT pulses palpable bilaterally, CRT less than 3 seconds Bandage intact to the right 4th toe. There is mild erythema to the level of the MTPJ but no ascending cellulitis. No other open lesions on the right foot today.  No open lesions or pre-ulcerative lesions.  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   Plan: -All treatment options discussed with the patient including all alternatives, risks, complications.  -Again discussed surgery including all alternatives, risks, complications. I answered all questions to the best of my ability. He and his wife have no further questions. Surgical consent reviewed. -NPO  -NWB postop -Patient and his wife do not want to admit postop unless absolutely needed.    Celesta Gentile, DPM

## 2016-10-13 NOTE — Transfer of Care (Signed)
Immediate Anesthesia Transfer of Care Note  Patient: Charles [name]  Procedure(s) Performed: Procedure(s): TRANSMETATARSAL AMPUTATION (Right)  Patient Location: PACU  Anesthesia Type:MAC  Level of Consciousness: awake, alert  and oriented  Airway & Oxygen Therapy: Patient Spontanous Breathing and Patient connected to nasal cannula oxygen  Post-op Assessment: Report given to RN  Post vital signs: Reviewed and stable  Last Vitals:  Vitals:   10/13/16 1213  BP: (!) 116/45  Pulse: 68  Resp: 20  Temp: 36.7 C    Last Pain:  Vitals:   10/13/16 1213  TempSrc: Oral      Patients Stated Pain Goal: 3 ([id number])  Complications: No apparent anesthesia complications

## 2016-10-13 NOTE — Anesthesia Preprocedure Evaluation (Signed)
Anesthesia Evaluation    Airway        Dental   Pulmonary sleep apnea , COPD, former smoker,           Cardiovascular hypertension, + angina + CAD, + Past MI, + Peripheral Vascular Disease and +CHF       Neuro/Psych CVA, Residual Symptoms    GI/Hepatic   Endo/Other  diabetes  Renal/GU Renal disease     Musculoskeletal   Abdominal   Peds  Hematology   Anesthesia Other Findings Study Conclusions  - Left ventricle: The cavity size was normal. Systolic function was   normal. The estimated ejection fraction was in the range of 55%   to 60%. Possible hypokinesis of the apical myocardium. Doppler   parameters are consistent with abnormal left ventricular   relaxation (grade 1 diastolic dysfunction). Doppler parameters   are consistent with high ventricular filling pressure. - Mitral valve: Calcified annulus. - Left atrium: The atrium was mildly dilated.  Impressions:  - Technically difficult; definity used; probable mild apical   hypokinesis with overall preserved LV function; no apical   thrombus; grade 1 diastolic dysfunction with elevated LV filling   pressure; mild LAE.  Reproductive/Obstetrics                             Anesthesia Physical Anesthesia Plan Anesthesia Quick Evaluation

## 2016-10-13 NOTE — Telephone Encounter (Signed)
"  I was just wondering if you got a hold of Dr. Harrington Challenger or know of anything in that direction.  Give me a call.  Thank you."  I am calling to follow-up.  Are you at the surgery center?  "Yes, we are at the hospital.  Thanks for your help."  You are welcome.  I will call later to check up on him.  "Thanks Michole Lecuyer."

## 2016-10-14 ENCOUNTER — Encounter (HOSPITAL_COMMUNITY): Payer: Self-pay | Admitting: Podiatry

## 2016-10-14 NOTE — Anesthesia Postprocedure Evaluation (Signed)
Anesthesia Post Note  Patient: Charles [name]  Procedure(s) Performed: Procedure(s) (LRB): TRANSMETATARSAL AMPUTATION (Right)  Patient location during evaluation: PACU Anesthesia Type: General Level of consciousness: sedated Pain management: pain level controlled Vital Signs Assessment: post-procedure vital signs reviewed and stable Respiratory status: spontaneous breathing Cardiovascular status: stable Postop Assessment: no signs of nausea or vomiting Anesthetic complications: no     Last Vitals:  Vitals:   10/13/16 1545 10/13/16 1550  BP:  (!) 118/57  Pulse: 73 67  Resp: 17 14  Temp:  36.6 C    Last Pain:  Vitals:   10/13/16 1213  TempSrc: Oral   Pain Goal: Patients Stated Pain Goal: 3 (10/13/16 1151)               [place]

## 2016-10-15 NOTE — Op Note (Signed)
Surgeon: Celesta Gentile, DPM Assistants: none Pre-operative diagnosis: right 4th digit osteomyelitis  Post-operative diagnosis: same Procedure: Right TMA Pathology: Specimen: foot for pathology Pertinent Intra-op findings: see below Anesthesia: MAC/regional block by anesthesia Hemostasis: Anatomic EBL: per anesthesia Materials: 3-0 prolene, skin staples, JP drain Injectables: none Complications: none  Indications for surgery: 75 year old male presented to the office with concerns of the wound the right fourth toe. He was found to have exposed bone infection to the toe. He has previously had multiple toe amputations. Because of this and discussed the amputation of fourth toe but he likely benefit more from a transmetatarsal amputation and he wishes to have them proceed with this. I discussed the surgery with him and his wife. They're in agreement the procedure. All alternatives, risks, complications were discussed with the patient detail. No promises or guarantees were given as to the outcome of the procedure and all questions were answered to best of my ability.   Procedure in detail: The patient was both verbally and visually identified by myself, the nursing staff, and anesthesia staff in the preoperative holding area. They were then transferred to the operating room and placed on the operative table in supine position. The right lower extremity was scrubbed, prepped, draped in normal sterile fashion.  At this time attention was directed to the distal aspect of the right foot in which a fishmouth type incision was planned along the right foot just proximal to the metatarsophalangeal joints. The incision was made with a #10 blade scalpel to the epidermis the dermis. A needle tip Bovie was then utilized to dissect down to the metatarsals exposing each of the metatarsals. Keloid was utilized to expose metatarsals. A sagittal bone saw was utilized to resect metatarsals 1-5 try to keep a normal  parabola as much as possible. The forefoot was then disarticulated and passed off the table and sent for pathology. Tendon structures in soft tissue was debrided. During the procedure there is no purulence identified. The wound appear to be healthy and clean. At this time the incision was copiously irrigated with sterile saline and hemostasis was achieved. The incision was then closed with 3-0 Prolene as well as skin staples. A JP drain was placed within the surgical site. Betadine was painted over the incision followed by Xeroform and a dry sterile dressing. He was then awoken from anesthesia and found to tolerated procedure well any complications. He was transferred to PACU with vital signs stable and vascular status intact.  Postop instructions were discussed with him and his wife. Patient wished to be discharged home.  Celesta Gentile, DPM

## 2016-10-16 ENCOUNTER — Inpatient Hospital Stay (HOSPITAL_COMMUNITY)
Admission: EM | Admit: 2016-10-16 | Discharge: 2016-10-19 | DRG: 918 | Disposition: A | Discharge status: Home / Self Care | Payer: Medicare Other | Attending: Internal Medicine | Admitting: Internal Medicine

## 2016-10-16 ENCOUNTER — Encounter (HOSPITAL_COMMUNITY): Payer: Self-pay

## 2016-10-16 DIAGNOSIS — Z79899 Other long term (current) drug therapy: Secondary | ICD-10-CM

## 2016-10-16 DIAGNOSIS — E1149 Type 2 diabetes mellitus with other diabetic neurological complication: Secondary | ICD-10-CM

## 2016-10-16 DIAGNOSIS — I10 Essential (primary) hypertension: Secondary | ICD-10-CM | POA: Diagnosis present

## 2016-10-16 DIAGNOSIS — T50991A Poisoning by other drugs, medicaments and biological substances, accidental (unintentional), initial encounter: Principal | ICD-10-CM | POA: Diagnosis present

## 2016-10-16 DIAGNOSIS — Z8249 Family history of ischemic heart disease and other diseases of the circulatory system: Secondary | ICD-10-CM

## 2016-10-16 DIAGNOSIS — Z794 Long term (current) use of insulin: Secondary | ICD-10-CM

## 2016-10-16 DIAGNOSIS — M79671 Pain in right foot: Secondary | ICD-10-CM | POA: Diagnosis not present

## 2016-10-16 DIAGNOSIS — Z792 Long term (current) use of antibiotics: Secondary | ICD-10-CM

## 2016-10-16 DIAGNOSIS — H538 Other visual disturbances: Secondary | ICD-10-CM | POA: Diagnosis present

## 2016-10-16 DIAGNOSIS — F40240 Claustrophobia: Secondary | ICD-10-CM | POA: Diagnosis present

## 2016-10-16 DIAGNOSIS — M1990 Unspecified osteoarthritis, unspecified site: Secondary | ICD-10-CM | POA: Diagnosis present

## 2016-10-16 DIAGNOSIS — Z89431 Acquired absence of right foot: Secondary | ICD-10-CM

## 2016-10-16 DIAGNOSIS — G4733 Obstructive sleep apnea (adult) (pediatric): Secondary | ICD-10-CM | POA: Diagnosis present

## 2016-10-16 DIAGNOSIS — I110 Hypertensive heart disease with heart failure: Secondary | ICD-10-CM | POA: Diagnosis present

## 2016-10-16 DIAGNOSIS — Y92019 Unspecified place in single-family (private) house as the place of occurrence of the external cause: Secondary | ICD-10-CM

## 2016-10-16 DIAGNOSIS — Z9989 Dependence on other enabling machines and devices: Secondary | ICD-10-CM

## 2016-10-16 DIAGNOSIS — Z87891 Personal history of nicotine dependence: Secondary | ICD-10-CM

## 2016-10-16 DIAGNOSIS — E11649 Type 2 diabetes mellitus with hypoglycemia without coma: Secondary | ICD-10-CM | POA: Diagnosis not present

## 2016-10-16 DIAGNOSIS — Z8614 Personal history of Methicillin resistant Staphylococcus aureus infection: Secondary | ICD-10-CM

## 2016-10-16 DIAGNOSIS — H9190 Unspecified hearing loss, unspecified ear: Secondary | ICD-10-CM | POA: Diagnosis present

## 2016-10-16 DIAGNOSIS — J90 Pleural effusion, not elsewhere classified: Secondary | ICD-10-CM | POA: Diagnosis not present

## 2016-10-16 DIAGNOSIS — E1140 Type 2 diabetes mellitus with diabetic neuropathy, unspecified: Secondary | ICD-10-CM | POA: Diagnosis not present

## 2016-10-16 DIAGNOSIS — Z7902 Long term (current) use of antithrombotics/antiplatelets: Secondary | ICD-10-CM

## 2016-10-16 DIAGNOSIS — Z79891 Long term (current) use of opiate analgesic: Secondary | ICD-10-CM

## 2016-10-16 DIAGNOSIS — E161 Other hypoglycemia: Secondary | ICD-10-CM | POA: Diagnosis not present

## 2016-10-16 DIAGNOSIS — I2510 Atherosclerotic heart disease of native coronary artery without angina pectoris: Secondary | ICD-10-CM | POA: Diagnosis present

## 2016-10-16 DIAGNOSIS — E7800 Pure hypercholesterolemia, unspecified: Secondary | ICD-10-CM | POA: Diagnosis present

## 2016-10-16 DIAGNOSIS — Z6841 Body Mass Index (BMI) 40.0 and over, adult: Secondary | ICD-10-CM

## 2016-10-16 DIAGNOSIS — I480 Paroxysmal atrial fibrillation: Secondary | ICD-10-CM | POA: Diagnosis present

## 2016-10-16 DIAGNOSIS — Z7982 Long term (current) use of aspirin: Secondary | ICD-10-CM

## 2016-10-16 DIAGNOSIS — I959 Hypotension, unspecified: Secondary | ICD-10-CM | POA: Diagnosis present

## 2016-10-16 DIAGNOSIS — I69398 Other sequelae of cerebral infarction: Secondary | ICD-10-CM

## 2016-10-16 DIAGNOSIS — E162 Hypoglycemia, unspecified: Secondary | ICD-10-CM | POA: Diagnosis present

## 2016-10-16 DIAGNOSIS — Z9861 Coronary angioplasty status: Secondary | ICD-10-CM

## 2016-10-16 DIAGNOSIS — I5032 Chronic diastolic (congestive) heart failure: Secondary | ICD-10-CM | POA: Diagnosis not present

## 2016-10-16 DIAGNOSIS — R4182 Altered mental status, unspecified: Secondary | ICD-10-CM | POA: Diagnosis not present

## 2016-10-16 DIAGNOSIS — Z803 Family history of malignant neoplasm of breast: Secondary | ICD-10-CM

## 2016-10-16 DIAGNOSIS — Z89412 Acquired absence of left great toe: Secondary | ICD-10-CM

## 2016-10-16 DIAGNOSIS — E538 Deficiency of other specified B group vitamins: Secondary | ICD-10-CM | POA: Diagnosis present

## 2016-10-16 DIAGNOSIS — Z7952 Long term (current) use of systemic steroids: Secondary | ICD-10-CM

## 2016-10-16 DIAGNOSIS — Z85828 Personal history of other malignant neoplasm of skin: Secondary | ICD-10-CM

## 2016-10-16 DIAGNOSIS — Z8711 Personal history of peptic ulcer disease: Secondary | ICD-10-CM

## 2016-10-16 DIAGNOSIS — Z7951 Long term (current) use of inhaled steroids: Secondary | ICD-10-CM

## 2016-10-16 DIAGNOSIS — H548 Legal blindness, as defined in USA: Secondary | ICD-10-CM | POA: Diagnosis present

## 2016-10-16 DIAGNOSIS — E1151 Type 2 diabetes mellitus with diabetic peripheral angiopathy without gangrene: Secondary | ICD-10-CM | POA: Diagnosis present

## 2016-10-16 LAB — I-STAT TROPONIN, ED: Troponin i, poc: 0.01 ng/mL (ref 0.00–0.08)

## 2016-10-16 LAB — CBC WITH DIFFERENTIAL/PLATELET
Basophils Absolute: 0 10*3/uL (ref 0.0–0.1)
Basophils Relative: 0 %
EOS PCT: 2 %
Eosinophils Absolute: 0.2 10*3/uL (ref 0.0–0.7)
HCT: 32.3 % — ABNORMAL LOW (ref 39.0–52.0)
Hemoglobin: 10.5 g/dL — ABNORMAL LOW (ref 13.0–17.0)
LYMPHS ABS: 1.2 10*3/uL (ref 0.7–4.0)
LYMPHS PCT: 12 %
MCH: 31.5 pg (ref 26.0–34.0)
MCHC: 32.5 g/dL (ref 30.0–36.0)
MCV: 97 fL (ref 78.0–100.0)
MONO ABS: 1 10*3/uL (ref 0.1–1.0)
MONOS PCT: 10 %
Neutro Abs: 8.1 10*3/uL — ABNORMAL HIGH (ref 1.7–7.7)
Neutrophils Relative %: 76 %
PLATELETS: 234 10*3/uL (ref 150–400)
RBC: 3.33 MIL/uL — AB (ref 4.22–5.81)
RDW: 16 % — AB (ref 11.5–15.5)
WBC: 10.5 10*3/uL (ref 4.0–10.5)

## 2016-10-16 LAB — CBG MONITORING, ED
GLUCOSE-CAPILLARY: 33 mg/dL — AB (ref 65–99)
GLUCOSE-CAPILLARY: 78 mg/dL (ref 65–99)

## 2016-10-16 LAB — I-STAT CG4 LACTIC ACID, ED: LACTIC ACID, VENOUS: 1.9 mmol/L (ref 0.5–1.9)

## 2016-10-16 MED ORDER — DEXTROSE 10 % IV SOLN
INTRAVENOUS | Status: DC
  Administered 2016-10-16 – 2016-10-17 (×2): via INTRAVENOUS

## 2016-10-16 MED ORDER — DEXTROSE 50 % IV SOLN
INTRAVENOUS | Status: AC
  Administered 2016-10-16: 50 mL
  Filled 2016-10-16: qty 50

## 2016-10-16 MED ORDER — FENTANYL CITRATE (PF) 100 MCG/2ML IJ SOLN
50.0000 ug | Freq: Once | INTRAMUSCULAR | Status: DC
  Filled 2016-10-16: qty 2

## 2016-10-16 NOTE — ED Provider Notes (Signed)
[facility] DEPT Provider Note   CSN: [id number] Arrival date & time: 10/16/16  2300   By signing my name below, I, Macon Large, attest that this documentation has been prepared under the direction and in the presence of Orpah Greek, MD. Electronically Signed: Macon Large, ED Scribe. 10/16/16. 11:44 PM.  History   Chief Complaint Chief Complaint  Patient presents with  . Hypoglycemia   The history is provided by the patient and the spouse. No language interpreter was used.   Associated symptoms: dizziness and speech difficulty      HPI Comments: Charles [name] is a 75 y.o. male hx of Dm, who presents to the Emergency Department complaining of moderate, constant, hypoglycemia onset this morning. Per Pt's spouse, she reports calling EMS this morning and Pt was given a D50 after she noticed he was unable to talk, his eyes were rolled up and he was unable to focus. She notes Pt refused to come to the hospital afterwards for reevaluation. Pt's CBG was 72 PTA, but is now 33. She noted checking his CBG and BP every hour afterwards. Wife does note Pt's BP is below normal baseline with levels under 100. Per wife, she reports his BP is on average in the 130-150 range and are in sync with his CBG levels. She does note Pt had a peanut butter and jelly sandwich accompanied by chilli and some apple juice but his CBG levels were still significantly low. She reports Pt takes Actos, Amaryl for DM, but Pt did not take his DM medication dose today. She also reports Pt was unable to remove his clothing this morning which caused the flare of dizziness. She reports no modifying factors noted. She notes Pt had a Transmetatarsal amputation three days ago. He has not had a dressing change, but she denies any drainage from site. Pt denies sweating and lightheadedness. No additional complaints at this time.    Past Medical History:  Diagnosis Date  . Arthritis   . B12 deficiency   . CAD  (coronary artery disease)    a. Approx. 2000 - MI. Cath: single vsl dz, PTCA dLAD/medical rx. ;  b. NSTEMI 11/13 => IVUS attempted for RCA but not successful; anatomy felt stable from 2000 => med Rx. c. [place] 08/2016: severe stable diffuse dLAD, severe prox RCA -> PCI of RCA unsuccessful, consider CABG for refractory angina.  . Cancer ([place])    skin cancer- head   . Cataracts   . Cholelithiasis   . Chronic diastolic CHF (congestive heart failure) ([place])   . Claustrophobia   . CVA (cerebral vascular accident) ([place]) ~ 2001   vision inparted from stroke.  Visual Memory loss  . Diabetic neuropathy ([place])   . Diverticulitis   . Dysrhythmia    if he does not take Metoprolol- Afib  . ED (erectile dysfunction)   . Essential hypertension   . History of MRSA infection    Right foot  . History of stomach ulcers   . HOH (hard of hearing)   . Hypercholesterolemia   . Legally blind   . Lower extremity edema   . Morbid obesity ([place])   . Neuropathy ([place])   . OSA on CPAP   . PAF (paroxysmal atrial fibrillation) ([name])    a. confirmed by event monitor. b. 02/2014 rash on Coumadin, patient decided to discontinue Xarelto due to possible rash, cost and lawyers ads on TV, agreed to take Plavix.  . Pneumonia 2016  . Tunnel vision  Since stroke  . Type II diabetes mellitus ([place])   . Venous stasis     Patient Active Problem List   Diagnosis Date Noted  . Chronic ulcer of left foot ([place]) 08/31/2016  . Acute on chronic diastolic (congestive) heart failure 08/20/2016  . Venous stasis   . Lower extremity edema   . Hypercholesterolemia   . Diabetic neuropathy ([place])   . Unstable angina ([place]) 08/15/2016  . 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 Pressure ulcer 12/27/2015  . Hypoglycemia 12/26/2015  . Gangrene of toe ([place]) 12/26/2015  . Acute renal failure (ARF) ([place]) 12/26/2015  . Subconjunctival hemorrhage 10/21/2015  . Cellulitis in diabetic foot ([place])   . Cellulitis 05/06/2015  . COPD exacerbation  ([place]) 10/10/2014  . Hematuria 08/12/2014  . Hypokalemia 08/12/2014  . Urinary retention 08/03/2014  . Hypotension 07/26/2014  . COPD  07/26/2014  . Rash and nonspecific skin eruption 02/13/2014  . PAF (paroxysmal atrial fibrillation) ([place]) 11/13/2013  . Morbid obesity ([place]) 05/24/2012  . Type 2 diabetes, uncontrolled, with retinopathy ([place]) 02/09/2012  . NEOPLASM OF UNCERTAIN BEHAVIOR OF SKIN 12/24/2010  . WEIGHT GAIN, ABNORMAL 02/02/2010  . TOBACCO USE, QUIT 02/02/2010  . HYPOGONADISM 07/12/2007  . B12 deficiency 07/12/2007  . Essential hypertension 07/12/2007  . MYOCARDIAL INFARCTION, HX OF 07/12/2007  . CAD in native artery 07/12/2007  . History of CVA (cerebrovascular accident) 07/12/2007  . VENOUS INSUFFICIENCY, LEGS 07/12/2007  . SYMPTOM, MEMORY LOSS 07/12/2007  . DIVERTICULITIS, HX OF 07/12/2007    Past Surgical History:  Procedure Laterality Date  . CARDIAC CATHETERIZATION  1990's  . CARDIAC CATHETERIZATION N/A 08/17/2016   Procedure: Left Heart Cath and Coronary Angiography;  Surgeon: Peter M Martinique, MD;  Location: [facility];  Service: Cardiovascular;  Laterality: N/A;  . CARDIAC CATHETERIZATION N/A 08/19/2016   Procedure: Coronary Balloon Angioplasty;  Surgeon: Peter M Martinique, MD;  Location: [facility];  Service: Cardiovascular;  Laterality: N/A;  . CEREBRAL ANGIOGRAM  ~ 2000  . COLONOSCOPY    . LEFT HEART CATHETERIZATION WITH CORONARY ANGIOGRAM N/A 10/27/2012   Procedure: LEFT HEART CATHETERIZATION WITH CORONARY ANGIOGRAM;  Surgeon: Burnell Blanks, MD;  Location: [facility];  Service: Cardiovascular;  Laterality: N/A;  . TOE AMPUTATION  2006; 2009   "Dr. Blenda Mounts; big toe left foot; little toe on right foot" (10/26/2012)  . TOE AMPUTATION Right 12/24/2015   2nd & 3 toes/notes 1/13//2017  . TOE AMPUTATION Right    5TH TOE  . TRANSMETATARSAL AMPUTATION Right 10/13/2016   Procedure: TRANSMETATARSAL AMPUTATION;  Surgeon: Trula Slade, DPM;   Location: [facility];  Service: Podiatry;  Laterality: Right;       Home Medications    Prior to Admission medications   Medication Sig Start Date End Date Taking? Authorizing Provider  aspirin EC 81 MG tablet Take 81 mg by mouth daily.   Yes Historical Provider, MD  Cholecalciferol (EQL VITAMIN D3) 1000 UNITS tablet Take 1,000 Units by mouth daily.     Yes Historical Provider, MD  ciprofloxacin (CIPRO) 500 MG tablet Take 1 tablet (500 mg total) by mouth 2 (two) times daily. 09/27/16  Yes Trula Slade, DPM  clindamycin (CLEOCIN) 300 MG capsule Take 1 capsule (300 mg total) by mouth 3 (three) times daily. 09/20/16  Yes Trula Slade, DPM  clopidogrel (PLAVIX) 75 MG tablet Take 1 tablet (75 mg total) by mouth daily. 06/02/16  Yes Cassandria Anger, MD  Fluticasone-Salmeterol (ADVAIR DISKUS) 100-50 MCG/DOSE AEPB  Inhale 1 puff into the lungs 2 (two) times daily. Patient taking differently: Inhale 1 puff into the lungs 2 (two) times daily as needed (for shortness of breath).  03/02/16  Yes Cassandria Anger, MD  furosemide (LASIX) 80 MG tablet Take 1/2 tablet daily alternating with whole tablet daily Patient taking differently: Take 40-80 mg by mouth See admin instructions. Take 1/2 tablet daily alternating with whole tablet daily 06/02/16  Yes Evie Lacks Plotnikov, MD  gabapentin (NEURONTIN) 300 MG capsule TAKE 1 CAPSULE THREE TIMES DAILY AS NEEDED FOR NERVE PAIN Patient taking differently: Take 300 mg by mouth 2 (two) times daily.  06/02/16  Yes Evie Lacks Plotnikov, MD  glimepiride (AMARYL) 4 MG tablet Take 1 tablet (4 mg total) by mouth 2 (two) times daily. 06/02/16  Yes Evie Lacks Plotnikov, MD  insulin aspart (NOVOLOG FLEXPEN) 100 UNIT/ML FlexPen Use 5-20 units four times a day after meals and at bedtime Per sliding scale 100-120 cbg Patient taking differently: Inject 3-12 Units into the skin See admin instructions. Use 3-12 units four times a day after meals and at bedtime as needed per  sliding scale 100-120 cbg 03/12/16  Yes Cassandria Anger, MD  isosorbide mononitrate (IMDUR) 30 MG 24 hr tablet Take 1 tablet (30 mg total) by mouth daily. 08/20/16  Yes Dayna N Dunn, PA-C  losartan (COZAAR) 100 MG tablet Take 1 tablet (100 mg total) by mouth daily. 06/02/16  Yes Evie Lacks Plotnikov, MD  metoprolol (LOPRESSOR) 25 MG tablet Take 1 tablet (25 mg total) by mouth 2 (two) times daily. 08/20/16  Yes Dayna N Dunn, PA-C  nitroGLYCERIN (NITROSTAT) 0.4 MG SL tablet Place 1 tablet (0.4 mg total) under the tongue every 5 (five) minutes as needed (up to 3 doses). For chest pain 08/20/16  Yes Dayna N Dunn, PA-C  oxyCODONE-acetaminophen (PERCOCET/ROXICET) 5-325 MG tablet Take 1-2 tablets by mouth every 6 (six) hours as needed for severe pain. 10/13/16  Yes Trula Slade, DPM  pioglitazone (ACTOS) 45 MG tablet Take 1 tablet (45 mg total) by mouth daily. 06/02/16  Yes Evie Lacks Plotnikov, MD  potassium chloride SA (K-DUR,KLOR-CON) 20 MEQ tablet Take 1 tablet (20 mEq total) by mouth daily. 08/20/16  Yes Dayna N Dunn, PA-C  pravastatin (PRAVACHOL) 40 MG tablet Take 1 tablet (40 mg total) by mouth daily. 06/02/16  Yes Cassandria Anger, MD  promethazine (PHENERGAN) 25 MG tablet Take 1 tablet (25 mg total) by mouth every 8 (eight) hours as needed for nausea or vomiting. 10/13/16  Yes Trula Slade, DPM  triamcinolone cream (KENALOG) 0.5 % Apply 1 application topically 2 (two) times daily as needed (rash). 04/18/15  Yes Evie Lacks Plotnikov, MD  vitamin B-12 (CYANOCOBALAMIN) 1000 MCG tablet Take 1,000 mcg by mouth daily.     Yes Historical Provider, MD  erythromycin ophthalmic ointment Place 1 application into the left eye 3 (three) times daily. Patient not taking: Reported on 10/17/2016 06/02/16   Cassandria Anger, MD    Family History Family History  Problem Relation Age of Onset  . Breast cancer Mother 15  . Heart disease Father 73  . Asthma Neg Hx     Social History Social History  Substance  Use Topics  . Smoking status: Former Smoker    Packs/day: 1.00    Years: 30.00    Types: Cigarettes, Cigars  . Smokeless tobacco: Former Systems developer    Quit date: 08/15/2016     Comment: 10/26/2012 "quit smoking cigarettes  in ~  66; wife smokes in the house"  . Alcohol use Yes     Comment: 10/26/2012 "have a drink maybe twice/yr"     Allergies   Xarelto [rivaroxaban]   Review of Systems Review of Systems  Constitutional: Positive for activity change.  Musculoskeletal: Positive for myalgias (right foot).  Neurological: Positive for dizziness and speech difficulty. Negative for light-headedness.  All other systems reviewed and are negative.    Physical Exam Updated Vital Signs BP 122/61   Pulse 85   Temp 97.9 F (36.6 C) (Oral)   Resp 19   SpO2 99%   Physical Exam  Constitutional: He is oriented to person, place, and time. He appears well-developed and well-nourished. No distress.  HENT:  Head: Normocephalic and atraumatic.  Right Ear: Hearing normal.  Left Ear: Hearing normal.  Nose: Nose normal.  Mouth/Throat: Oropharynx is clear and moist and mucous membranes are normal.  Eyes: Conjunctivae and EOM are normal. Pupils are equal, round, and reactive to light.  Neck: Normal range of motion. Neck supple.  Cardiovascular: Regular rhythm, S1 normal and S2 normal.  Exam reveals no gallop and no friction rub.   No murmur heard. Pulmonary/Chest: Effort normal and breath sounds normal. No respiratory distress. He exhibits no tenderness.  Abdominal: Soft. Normal appearance and bowel sounds are normal. There is no hepatosplenomegaly. There is no tenderness. There is no rebound, no guarding, no tenderness at McBurney's point and negative Murphy's sign. No hernia.  Musculoskeletal: Normal range of motion. He exhibits tenderness.  Bandage to right foot with tenderness. Postoperative bandage still in place.   Neurological: He is alert and oriented to person, place, and time. He has  normal strength. No cranial nerve deficit or sensory deficit. Coordination normal. GCS eye subscore is 4. GCS verbal subscore is 5. GCS motor subscore is 6.  Skin: Skin is warm, dry and intact. No rash noted. No cyanosis.  Psychiatric: He has a normal mood and affect. His speech is normal and behavior is normal. Thought content normal.  Nursing note and vitals reviewed.    ED Treatments / Results   DIAGNOSTIC STUDIES: Oxygen Saturation is 99% on RA, normal by my interpretation.    COORDINATION OF CARE: 11:22 PM Discussed treatment plan with pt at bedside  Which includes labs, EKG, right foot and Chest XR and pt agreed to plan.  Labs (all labs ordered are listed, but only abnormal results are displayed) Labs Reviewed  CBC WITH DIFFERENTIAL/PLATELET - Abnormal; Notable for the following:       Result Value   RBC 3.33 (*)    Hemoglobin 10.5 (*)    HCT 32.3 (*)    RDW 16.0 (*)    Neutro Abs 8.1 (*)    All other components within normal limits  COMPREHENSIVE METABOLIC PANEL - Abnormal; Notable for the following:    Sodium 133 (*)    Chloride 100 (*)    Glucose, Bld 30 (*)    Calcium 8.1 (*)    Total Protein 5.6 (*)    Albumin 2.7 (*)    ALT 11 (*)    GFR calc non Af Amer 57 (*)    All other components within normal limits  URINALYSIS, ROUTINE W REFLEX MICROSCOPIC (NOT AT [facility]) - Abnormal; Notable for the following:    Leukocytes, UA SMALL (*)    All other components within normal limits  URINE MICROSCOPIC-ADD ON - Abnormal; Notable for the following:    Squamous Epithelial / LPF 0-5 (*)  Bacteria, UA RARE (*)    Casts HYALINE CASTS (*)    All other components within normal limits  CBG MONITORING, ED - Abnormal; Notable for the following:    Glucose-Capillary 33 (*)    All other components within normal limits  CBG MONITORING, ED - Abnormal; Notable for the following:    Glucose-Capillary 58 (*)    All other components within normal limits  URINE CULTURE  I-STAT CG4  LACTIC ACID, ED  CBG MONITORING, ED  I-STAT TROPOININ, ED  CBG MONITORING, ED  CBG MONITORING, ED  CBG MONITORING, ED    EKG  EKG Interpretation  Date/Time:  Saturday October 16 2016 23:35:17 EDT Ventricular Rate:  83 PR Interval:    QRS Duration: 90 QT Interval:  375 QTC Calculation: 441 R Axis:   33 Text Interpretation:  Sinus rhythm Low voltage, precordial leads No significant change since last tracing Confirmed by POLLINA  MD, CHRISTOPHER [phone]) on 10/17/2016 12:10:38 AM       Radiology Dg Chest 2 View  Result Date: 10/17/2016 CLINICAL DATA:  Dyspnea. EXAM: CHEST  2 VIEW COMPARISON:  08/15/2016 FINDINGS: The lungs are clear. Very small pleural effusion, probably on the left. No airspace consolidation. Normal pulmonary vasculature. Hilar and mediastinal contours are normal and unchanged. IMPRESSION: Small left pleural effusion.  No airspace consolidation. Electronically Signed   By: Andreas Newport M.D.   On: 10/17/2016 00:59   Ct Head Wo Contrast  Result Date: 10/17/2016 CLINICAL DATA:  Acute onset of altered mental status. Initial encounter. EXAM: CT HEAD WITHOUT CONTRAST TECHNIQUE: Contiguous axial images were obtained from the base of the skull through the vertex without intravenous contrast. COMPARISON:  CT of the head performed 06/11/2014 FINDINGS: Brain: No evidence of acute infarction, hemorrhage, hydrocephalus, extra-axial collection or mass lesion/mass effect. Prominence of the ventricles and sulci reflects moderate cortical volume loss. Cerebellar atrophy is noted. 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 The brainstem and fourth ventricle are within normal limits. The basal ganglia are unremarkable in appearance. No mass effect or midline shift is seen. Vascular: No hyperdense vessel or unexpected calcification. Skull: There is no evidence of fracture; visualized osseous  structures are unremarkable in appearance. Sinuses/Orbits: The orbits are within normal limits. The paranasal sinuses and mastoid air cells are well-aerated. Other: No significant soft tissue abnormalities are seen. IMPRESSION: 1. No acute intracranial pathology seen on CT. 2. Moderate cortical volume loss noted. 3. 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 Electronically Signed   By: Garald Balding M.D.   On: 10/17/2016 01:09   Dg Foot Complete Right  Result Date: 10/17/2016 CLINICAL DATA:  Foot amputation on 10/13/2016. Pain in the right foot all day today. EXAM: RIGHT FOOT COMPLETE - 3+ VIEW COMPARISON:  10/13/2016 FINDINGS: Transmetatarsal amputation. Soft tissue drain and skin staples are present. No soft tissue gas. No bony destruction. No acute findings are evident. IMPRESSION: Expected appearances after transmetatarsal amputation. No soft tissue gas. Electronically Signed   By: Andreas Newport M.D.   On: 10/17/2016 01:00    Procedures Procedures (including critical care time)  Medications Ordered in ED Medications  dextrose 10 % infusion ( Intravenous Rate/Dose Change 10/17/16 0021)  dextrose 50 % solution (50 mLs  Given 10/16/16 2320)  dextrose 50 % solution (  Given 10/17/16 0021)     Initial Impression / Assessment and Plan / ED  Course  I have reviewed the triage vital signs and the nursing notes.  Pertinent labs & imaging results that were available during my care of the patient were reviewed by me and considered in my medical decision making (see chart for details).  Clinical Course    Patient brought to the emergency department for evaluation of low blood sugar. Patient does have a history of diabetes, takes Amaryl, Actos and insulin. No recent changes in dosing. Wife reports no possibility of overdose. Patient apparently woke up this morning confused and blood sugar was in the 30s. EMS was called  to the house, administered dextrose and patient refused transportation. Wife reports checking the blood sugar throughout the course of the day every hour and giving him oral glucose and foods but his blood sugar has remained low. Blood sugar dropped tonight even further and patient was brought to the ER. Patient was found to have a blood sugar in the 30s once again. He has required multiple doses of D50 and is on a D10 drip and continues to drop. He will require hospitalization for further management of refractory hypoglycemia. Etiology is unclear at this time. Suspect medication error. Patient complaining of right foot pain. He had transmetatarsal amputation this past week. Patient still has a JP drain in place, bloody drainage noted without any signs of infection. X-ray does not show any gas formation. CT head performed, no acute stroke or other abnormality. Chest x-ray does not show pneumonia.  CRITICAL CARE Performed by: Orpah Greek   Total critical care time: 30 minutes  Critical care time was exclusive of separately billable procedures and treating other patients.  Critical care was necessary to treat or prevent imminent or life-threatening deterioration.  Critical care was time spent personally by me on the following activities: development of treatment plan with patient and/or surrogate as well as nursing, discussions with consultants, evaluation of patient's response to treatment, examination of patient, obtaining history from patient or surrogate, ordering and performing treatments and interventions, ordering and review of laboratory studies, ordering and review of radiographic studies, pulse oximetry and re-evaluation of patient's condition.   Final Clinical Impressions(s) / ED Diagnoses   Final diagnoses:  Hypoglycemia    New Prescriptions New Prescriptions   No medications on file    I personally performed the services described in this documentation, which was  scribed in my presence. The recorded information has been reviewed and is accurate.     Orpah Greek, MD 10/17/16 [phone]

## 2016-10-16 NOTE — ED Triage Notes (Signed)
Per wife pt was hard to arouse this morning EMS was called and pt was given D50, pt refused to come to hospital. Per wife before coming to hospital CBG was 72, now 33, BP 79/39, opt alert and oriented.

## 2016-10-17 ENCOUNTER — Emergency Department (HOSPITAL_COMMUNITY): Payer: Medicare Other

## 2016-10-17 DIAGNOSIS — E11649 Type 2 diabetes mellitus with hypoglycemia without coma: Secondary | ICD-10-CM | POA: Diagnosis not present

## 2016-10-17 DIAGNOSIS — E1151 Type 2 diabetes mellitus with diabetic peripheral angiopathy without gangrene: Secondary | ICD-10-CM | POA: Diagnosis present

## 2016-10-17 DIAGNOSIS — Z6841 Body Mass Index (BMI) 40.0 and over, adult: Secondary | ICD-10-CM | POA: Diagnosis not present

## 2016-10-17 DIAGNOSIS — E1140 Type 2 diabetes mellitus with diabetic neuropathy, unspecified: Secondary | ICD-10-CM | POA: Diagnosis present

## 2016-10-17 DIAGNOSIS — I10 Essential (primary) hypertension: Secondary | ICD-10-CM | POA: Diagnosis not present

## 2016-10-17 DIAGNOSIS — E162 Hypoglycemia, unspecified: Secondary | ICD-10-CM

## 2016-10-17 DIAGNOSIS — M79671 Pain in right foot: Secondary | ICD-10-CM | POA: Diagnosis not present

## 2016-10-17 DIAGNOSIS — I110 Hypertensive heart disease with heart failure: Secondary | ICD-10-CM | POA: Diagnosis present

## 2016-10-17 DIAGNOSIS — F40240 Claustrophobia: Secondary | ICD-10-CM | POA: Diagnosis present

## 2016-10-17 DIAGNOSIS — I25810 Atherosclerosis of coronary artery bypass graft(s) without angina pectoris: Secondary | ICD-10-CM | POA: Diagnosis not present

## 2016-10-17 DIAGNOSIS — I2510 Atherosclerotic heart disease of native coronary artery without angina pectoris: Secondary | ICD-10-CM | POA: Diagnosis present

## 2016-10-17 DIAGNOSIS — H538 Other visual disturbances: Secondary | ICD-10-CM | POA: Diagnosis present

## 2016-10-17 DIAGNOSIS — I69398 Other sequelae of cerebral infarction: Secondary | ICD-10-CM | POA: Diagnosis not present

## 2016-10-17 DIAGNOSIS — T50991A Poisoning by other drugs, medicaments and biological substances, accidental (unintentional), initial encounter: Secondary | ICD-10-CM | POA: Diagnosis present

## 2016-10-17 DIAGNOSIS — G4733 Obstructive sleep apnea (adult) (pediatric): Secondary | ICD-10-CM | POA: Diagnosis present

## 2016-10-17 DIAGNOSIS — Z79891 Long term (current) use of opiate analgesic: Secondary | ICD-10-CM | POA: Diagnosis not present

## 2016-10-17 DIAGNOSIS — E538 Deficiency of other specified B group vitamins: Secondary | ICD-10-CM | POA: Diagnosis present

## 2016-10-17 DIAGNOSIS — Z7952 Long term (current) use of systemic steroids: Secondary | ICD-10-CM | POA: Diagnosis not present

## 2016-10-17 DIAGNOSIS — M1990 Unspecified osteoarthritis, unspecified site: Secondary | ICD-10-CM | POA: Diagnosis present

## 2016-10-17 DIAGNOSIS — J90 Pleural effusion, not elsewhere classified: Secondary | ICD-10-CM | POA: Diagnosis not present

## 2016-10-17 DIAGNOSIS — I5032 Chronic diastolic (congestive) heart failure: Secondary | ICD-10-CM | POA: Diagnosis present

## 2016-10-17 DIAGNOSIS — I4891 Unspecified atrial fibrillation: Secondary | ICD-10-CM | POA: Diagnosis not present

## 2016-10-17 DIAGNOSIS — R4182 Altered mental status, unspecified: Secondary | ICD-10-CM | POA: Diagnosis not present

## 2016-10-17 DIAGNOSIS — H548 Legal blindness, as defined in USA: Secondary | ICD-10-CM | POA: Diagnosis present

## 2016-10-17 DIAGNOSIS — H9190 Unspecified hearing loss, unspecified ear: Secondary | ICD-10-CM | POA: Diagnosis present

## 2016-10-17 DIAGNOSIS — I959 Hypotension, unspecified: Secondary | ICD-10-CM | POA: Diagnosis present

## 2016-10-17 DIAGNOSIS — I480 Paroxysmal atrial fibrillation: Secondary | ICD-10-CM | POA: Diagnosis present

## 2016-10-17 DIAGNOSIS — Z7982 Long term (current) use of aspirin: Secondary | ICD-10-CM | POA: Diagnosis not present

## 2016-10-17 DIAGNOSIS — E7800 Pure hypercholesterolemia, unspecified: Secondary | ICD-10-CM | POA: Diagnosis present

## 2016-10-17 DIAGNOSIS — Y92019 Unspecified place in single-family (private) house as the place of occurrence of the external cause: Secondary | ICD-10-CM | POA: Diagnosis not present

## 2016-10-17 DIAGNOSIS — Z794 Long term (current) use of insulin: Secondary | ICD-10-CM | POA: Diagnosis not present

## 2016-10-17 LAB — MRSA PCR SCREENING: MRSA by PCR: NEGATIVE

## 2016-10-17 LAB — COMPREHENSIVE METABOLIC PANEL
ALT: 11 U/L — AB (ref 17–63)
AST: 24 U/L (ref 15–41)
Albumin: 2.7 g/dL — ABNORMAL LOW (ref 3.5–5.0)
Alkaline Phosphatase: 43 U/L (ref 38–126)
Anion gap: 6 (ref 5–15)
BUN: 17 mg/dL (ref 6–20)
CHLORIDE: 100 mmol/L — AB (ref 101–111)
CO2: 27 mmol/L (ref 22–32)
CREATININE: 1.2 mg/dL (ref 0.61–1.24)
Calcium: 8.1 mg/dL — ABNORMAL LOW (ref 8.9–10.3)
GFR, EST NON AFRICAN AMERICAN: 57 mL/min — AB (ref >60)
Glucose, Bld: 30 mg/dL — CL (ref 65–99)
Potassium: 4.6 mmol/L (ref 3.5–5.1)
Sodium: 133 mmol/L — ABNORMAL LOW (ref 135–145)
Total Bilirubin: 0.5 mg/dL (ref 0.3–1.2)
Total Protein: 5.6 g/dL — ABNORMAL LOW (ref 6.5–8.1)

## 2016-10-17 LAB — URINE MICROSCOPIC-ADD ON: RBC / HPF: NONE SEEN RBC/hpf (ref 0–5)

## 2016-10-17 LAB — URINALYSIS, ROUTINE W REFLEX MICROSCOPIC
BILIRUBIN URINE: NEGATIVE
Glucose, UA: NEGATIVE mg/dL
Hgb urine dipstick: NEGATIVE
KETONES UR: NEGATIVE mg/dL
NITRITE: NEGATIVE
PROTEIN: NEGATIVE mg/dL
SPECIFIC GRAVITY, URINE: 1.018 (ref 1.005–1.030)
pH: 5.5 (ref 5.0–8.0)

## 2016-10-17 LAB — GLUCOSE, CAPILLARY
GLUCOSE-CAPILLARY: 83 mg/dL (ref 65–99)
GLUCOSE-CAPILLARY: 96 mg/dL (ref 65–99)
GLUCOSE-CAPILLARY: 106 mg/dL — AB (ref 65–99)
GLUCOSE-CAPILLARY: 179 mg/dL — AB (ref 65–99)
Glucose-Capillary: 93 mg/dL (ref 65–99)
Glucose-Capillary: 128 mg/dL — ABNORMAL HIGH (ref 65–99)
Glucose-Capillary: 131 mg/dL — ABNORMAL HIGH (ref 65–99)
Glucose-Capillary: 170 mg/dL — ABNORMAL HIGH (ref 65–99)

## 2016-10-17 LAB — CBG MONITORING, ED
GLUCOSE-CAPILLARY: 83 mg/dL (ref 65–99)
GLUCOSE-CAPILLARY: 79 mg/dL (ref 65–99)
GLUCOSE-CAPILLARY: 94 mg/dL (ref 65–99)
Glucose-Capillary: 55 mg/dL — ABNORMAL LOW (ref 65–99)
Glucose-Capillary: 58 mg/dL — ABNORMAL LOW (ref 65–99)
Glucose-Capillary: 65 mg/dL (ref 65–99)

## 2016-10-17 LAB — BASIC METABOLIC PANEL
ANION GAP: 7 (ref 5–15)
Anion gap: 5 (ref 5–15)
BUN: 17 mg/dL (ref 6–20)
BUN: 16 mg/dL (ref 6–20)
CALCIUM: 8 mg/dL — AB (ref 8.9–10.3)
CALCIUM: 8.1 mg/dL — AB (ref 8.9–10.3)
CO2: 25 mmol/L (ref 22–32)
CO2: 26 mmol/L (ref 22–32)
CREATININE: 1.03 mg/dL (ref 0.61–1.24)
CREATININE: 1.02 mg/dL (ref 0.61–1.24)
Chloride: 100 mmol/L — ABNORMAL LOW (ref 101–111)
Chloride: 101 mmol/L (ref 101–111)
GFR calc Af Amer: >60 mL/min (ref >60)
GFR calc Af Amer: >60 mL/min (ref >60)
GLUCOSE: 82 mg/dL (ref 65–99)
GLUCOSE: 129 mg/dL — AB (ref 65–99)
Potassium: 4.7 mmol/L (ref 3.5–5.1)
Potassium: 5.2 mmol/L — ABNORMAL HIGH (ref 3.5–5.1)
Sodium: 132 mmol/L — ABNORMAL LOW (ref 135–145)
Sodium: 132 mmol/L — ABNORMAL LOW (ref 135–145)

## 2016-10-17 MED ORDER — GABAPENTIN 300 MG PO CAPS
300.0000 mg | ORAL_CAPSULE | Freq: Two times a day (BID) | ORAL | Status: DC
  Administered 2016-10-17 – 2016-10-19 (×5): 300 mg via ORAL
  Filled 2016-10-17 (×5): qty 1

## 2016-10-17 MED ORDER — CLINDAMYCIN HCL 300 MG PO CAPS
300.0000 mg | ORAL_CAPSULE | Freq: Three times a day (TID) | ORAL | Status: DC
  Administered 2016-10-17 – 2016-10-19 (×7): 300 mg via ORAL
  Filled 2016-10-17 (×4): qty 2
  Filled 2016-10-17 (×2): qty 1
  Filled 2016-10-17: qty 2
  Filled 2016-10-17: qty 1

## 2016-10-17 MED ORDER — DEXTROSE 50 % IV SOLN
INTRAVENOUS | Status: AC
  Filled 2016-10-17: qty 50

## 2016-10-17 MED ORDER — PRAVASTATIN SODIUM 40 MG PO TABS
40.0000 mg | ORAL_TABLET | Freq: Every day | ORAL | Status: DC
  Administered 2016-10-17 – 2016-10-19 (×3): 40 mg via ORAL
  Filled 2016-10-17 (×3): qty 1

## 2016-10-17 MED ORDER — DEXTROSE 50 % IV SOLN: 1.0000 | Freq: Once | INTRAVENOUS | Status: DC

## 2016-10-17 MED ORDER — PROMETHAZINE HCL 25 MG PO TABS: 25.0000 mg | ORAL_TABLET | Freq: Three times a day (TID) | ORAL | Status: DC | PRN

## 2016-10-17 MED ORDER — DEXTROSE 50 % IV SOLN
INTRAVENOUS | Status: AC
  Administered 2016-10-17: 50 mL
  Filled 2016-10-17: qty 50

## 2016-10-17 MED ORDER — ENOXAPARIN SODIUM 80 MG/0.8ML ~~LOC~~ SOLN
80.0000 mg | SUBCUTANEOUS | Status: DC
  Administered 2016-10-17 – 2016-10-18 (×2): 80 mg via SUBCUTANEOUS
  Filled 2016-10-17 (×2): qty 0.8

## 2016-10-17 MED ORDER — HYDROCODONE-ACETAMINOPHEN 5-325 MG PO TABS
1.0000 | ORAL_TABLET | Freq: Four times a day (QID) | ORAL | Status: DC | PRN
  Administered 2016-10-17 – 2016-10-19 (×6): 2 via ORAL
  Filled 2016-10-17 (×7): qty 2

## 2016-10-17 MED ORDER — CIPROFLOXACIN HCL 500 MG PO TABS
500.0000 mg | ORAL_TABLET | Freq: Two times a day (BID) | ORAL | Status: DC
  Administered 2016-10-17 – 2016-10-19 (×5): 500 mg via ORAL
  Filled 2016-10-17 (×5): qty 1

## 2016-10-17 MED ORDER — CLOPIDOGREL BISULFATE 75 MG PO TABS
75.0000 mg | ORAL_TABLET | Freq: Every day | ORAL | Status: DC
  Administered 2016-10-17 – 2016-10-19 (×3): 75 mg via ORAL
  Filled 2016-10-17 (×3): qty 1

## 2016-10-17 MED ORDER — VITAMIN D 1000 UNITS PO TABS
1000.0000 [IU] | ORAL_TABLET | Freq: Every day | ORAL | Status: DC
  Administered 2016-10-17 – 2016-10-19 (×3): 1000 [IU] via ORAL
  Filled 2016-10-17 (×3): qty 1

## 2016-10-17 MED ORDER — OXYCODONE-ACETAMINOPHEN 5-325 MG PO TABS: 1.0000 | ORAL_TABLET | Freq: Four times a day (QID) | ORAL | Status: DC | PRN

## 2016-10-17 MED ORDER — SODIUM CHLORIDE 0.9% FLUSH
3.0000 mL | Freq: Two times a day (BID) | INTRAVENOUS | Status: DC
  Administered 2016-10-17 – 2016-10-19 (×5): 3 mL via INTRAVENOUS

## 2016-10-17 MED ORDER — DEXTROSE 50 % IV SOLN
50.0000 mL | Freq: Once | INTRAVENOUS | Status: AC
  Administered 2016-10-17: via INTRAVENOUS
  Administered 2016-10-17: 50 mL via INTRAVENOUS

## 2016-10-17 MED ORDER — ASPIRIN EC 81 MG PO TBEC
81.0000 mg | DELAYED_RELEASE_TABLET | Freq: Every day | ORAL | Status: DC
  Administered 2016-10-17 – 2016-10-19 (×3): 81 mg via ORAL
  Filled 2016-10-17 (×3): qty 1

## 2016-10-17 MED ORDER — FENTANYL CITRATE (PF) 100 MCG/2ML IJ SOLN
50.0000 ug | Freq: Once | INTRAMUSCULAR | Status: AC
  Administered 2016-10-17: 50 ug via INTRAVENOUS
  Filled 2016-10-17: qty 2

## 2016-10-17 MED ORDER — METOPROLOL TARTRATE 25 MG PO TABS
25.0000 mg | ORAL_TABLET | Freq: Two times a day (BID) | ORAL | Status: DC
  Administered 2016-10-17: 25 mg via ORAL
  Filled 2016-10-17: qty 1

## 2016-10-17 MED ORDER — MOMETASONE FURO-FORMOTEROL FUM 100-5 MCG/ACT IN AERO
2.0000 | INHALATION_SPRAY | Freq: Two times a day (BID) | RESPIRATORY_TRACT | Status: DC
  Administered 2016-10-17 – 2016-10-18 (×2): 2 via RESPIRATORY_TRACT
  Filled 2016-10-17 (×3): qty 8.8

## 2016-10-17 MED ORDER — DEXTROSE-NACL 5-0.9 % IV SOLN
INTRAVENOUS | Status: DC
  Administered 2016-10-17: 13:00:00 via INTRAVENOUS

## 2016-10-17 NOTE — Progress Notes (Signed)
75 y.o. WM PMHx, Stroke, DM Type 2 controlled with complication,Chronic diastolic CHF,Essential hypertension, HLD,PAF (paroxysmal atrial fibrillation ( on Coumadin, patient decided to discontinue Xarelto due to possible rash, cost and lawyers ads on TV, agreed to take Plavix. S/P recent trans-met amputation of the R foot.    Patient presents to the ED with constant, reoccurring hypoglycemia onset yesterday morning.  Per spouse she called EMS in the AM patient was given D50 after she noticed he was unable to talk.  After his mental status improved he refused to come to the hospital.  Unfortunately his wife has been battling low BGLs all day despite not giving him any of his DM meds at home.  EMS called again and patient transported to ED.  Initially hypoglycemic and hypotensive, his BP improves as his BGL improves with D50.  He is on a D10 gtt which has had to be titrated up to 100 cc/hr. Review of patient's chart shows that patient has actually been taking Amaryl, Actos, insulin despite story given to ED staff.   Examined patient and agree with plan.    1. Hypoglycemia associated with DM- 2. Over use of diabetic medication: Patient counseled on danger of overmedicating with diabetic medication to include, coma and death.  1. Hold DM meds 2. D5- 0.9% saline at 50 cc/hr 3. BMP at 0500 and again at 1200 to monitor sodium and potassium 4. Hypoglycemia protocol 5. Q4H CBG checks 3. HTN - hold all BP meds: Monitor for A.Fib dosent go in to [facility])  4. S/p trans-met of R foot - 1. Dressing C/D/I 2. No SIRS to suggest systemic infection at this point 3. Will continue the cipro/clinda that he has been put on by [facility] for now 4. Bandage scheduled for first change on Monday.    No charge

## 2016-10-17 NOTE — ED Notes (Signed)
CBG resulted: 71. RN notified.

## 2016-10-17 NOTE — H&P (Addendum)
History and Physical    Charles [name]:[id number] DOB: 11/08/1941 DOA: 10/16/2016   PCP: [name], MD Chief Complaint:  Chief Complaint  Patient presents with  . Hypoglycemia    HPI: Charles [name] is a 75 y.o. male with medical history significant of DM, recent trans-met amputation of the R foot.  Patient presents to the ED with constant, reoccurring hypoglycemia onset yesterday morning.  Per spouse she called EMS in the AM patient was given D50 after she noticed he was unable to talk.  After his mental status improved he refused to come to the hospital.  Unfortunately his wife has been battling low BGLs all day despite not giving him any of his DM meds at home.  EMS called again and patient transported to ED.  Initially hypoglycemic and hypotensive, his BP improves as his BGL improves with D50.  He is on a D10 gtt which has had to be titrated up to 100 cc/hr.  Review of Systems: As per HPI otherwise 10 point review of systems negative.    Past Medical History:  Diagnosis Date  . Arthritis   . B12 deficiency   . CAD (coronary artery disease)    a. Approx. 2000 - MI. Cath: single vsl dz, PTCA dLAD/medical rx. ;  b. NSTEMI 11/13 => IVUS attempted for RCA but not successful; anatomy felt stable from 2000 => med Rx. c. [place] 08/2016: severe stable diffuse dLAD, severe prox RCA -> PCI of RCA unsuccessful, consider CABG for refractory angina.  . Cancer ([place])    skin cancer- head   . Cataracts   . Cholelithiasis   . Chronic diastolic CHF (congestive heart failure) ([place])   . Claustrophobia   . CVA (cerebral vascular accident) ([place]) ~ 2001   vision inparted from stroke.  Visual Memory loss  . Diabetic neuropathy ([place])   . Diverticulitis   . Dysrhythmia    if he does not take Metoprolol- Afib  . ED (erectile dysfunction)   . Essential hypertension   . History of MRSA infection    Right foot  . History of stomach ulcers   . HOH (hard of hearing)   . Hypercholesterolemia   .  Legally blind   . Lower extremity edema   . Morbid obesity ([place])   . Neuropathy ([place])   . OSA on CPAP   . PAF (paroxysmal atrial fibrillation) ([name])    a. confirmed by event monitor. b. 02/2014 rash on Coumadin, patient decided to discontinue Xarelto due to possible rash, cost and lawyers ads on TV, agreed to take Plavix.  . Pneumonia 2016  . Tunnel vision    Since stroke  . Type II diabetes mellitus ([place])   . Venous stasis     Past Surgical History:  Procedure Laterality Date  . CARDIAC CATHETERIZATION  1990's  . CARDIAC CATHETERIZATION N/A 08/17/2016   Procedure: Left Heart Cath and Coronary Angiography;  Surgeon: Peter M Martinique, MD;  Location: [facility];  Service: Cardiovascular;  Laterality: N/A;  . CARDIAC CATHETERIZATION N/A 08/19/2016   Procedure: Coronary Balloon Angioplasty;  Surgeon: Peter M Martinique, MD;  Location: [facility];  Service: Cardiovascular;  Laterality: N/A;  . CEREBRAL ANGIOGRAM  ~ 2000  . COLONOSCOPY    . LEFT HEART CATHETERIZATION WITH CORONARY ANGIOGRAM N/A 10/27/2012   Procedure: LEFT HEART CATHETERIZATION WITH CORONARY ANGIOGRAM;  Surgeon: Burnell Blanks, MD;  Location: [facility] LAB;  Service: Cardiovascular;  Laterality: N/A;  . TOE AMPUTATION  2006; 2009   "Dr. Blenda Mounts; big toe left foot; little toe on right foot" (10/26/2012)  . TOE AMPUTATION Right 12/24/2015   2nd & 3 toes/notes 1/13//2017  . TOE AMPUTATION Right    5TH TOE  . 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 His smoking use included Cigarettes and Cigars. He has a 30.00 pack-year smoking history. He quit smokeless tobacco use about 2 months ago. He reports that he drinks alcohol. He reports that he does not use drugs.  Allergies  Allergen Reactions  . Xarelto [Rivaroxaban] Rash    Family History    Problem Relation Age of Onset  . Breast cancer Mother 61  . Heart disease Father 64  . Asthma Neg Hx       Prior to Admission medications   Medication Sig Start Date End Date Taking? Authorizing Provider  aspirin EC 81 MG tablet Take 81 mg by mouth daily.   Yes Historical Provider, MD  Cholecalciferol (EQL VITAMIN D3) 1000 UNITS tablet Take 1,000 Units by mouth daily.     Yes Historical Provider, MD  ciprofloxacin (CIPRO) 500 MG tablet Take 1 tablet (500 mg total) by mouth 2 (two) times daily. 09/27/16  Yes Trula Slade, DPM  clindamycin (CLEOCIN) 300 MG capsule Take 1 capsule (300 mg total) by mouth 3 (three) times daily. 09/20/16  Yes Trula Slade, DPM  clopidogrel (PLAVIX) 75 MG tablet Take 1 tablet (75 mg total) by mouth daily. 06/02/16  Yes Evie Lacks Plotnikov, MD  Fluticasone-Salmeterol (ADVAIR DISKUS) 100-50 MCG/DOSE AEPB Inhale 1 puff into the lungs 2 (two) times daily. Patient taking differently: Inhale 1 puff into the lungs 2 (two) times daily as needed (for shortness of breath).  03/02/16  Yes Cassandria Anger, MD  furosemide (LASIX) 80 MG tablet Take 1/2 tablet daily alternating with whole tablet daily Patient taking differently: Take 40-80 mg by mouth See admin instructions. Take 1/2 tablet daily alternating with whole tablet daily 06/02/16  Yes Evie Lacks Plotnikov, MD  gabapentin (NEURONTIN) 300 MG capsule TAKE 1 CAPSULE THREE TIMES DAILY AS NEEDED FOR NERVE PAIN Patient taking differently: Take 300 mg by mouth 2 (two) times daily.  06/02/16  Yes Evie Lacks Plotnikov, MD  glimepiride (AMARYL) 4 MG tablet Take 1 tablet (4 mg total) by mouth 2 (two) times daily. 06/02/16  Yes Evie Lacks Plotnikov, MD  insulin aspart (NOVOLOG FLEXPEN) 100 UNIT/ML FlexPen Use 5-20 units four times a day after meals and at bedtime Per sliding scale 100-120 cbg Patient taking differently: Inject 3-12 Units into the skin See admin instructions. Use 3-12 units four times a day after meals and at  bedtime as needed per sliding scale 100-120 cbg 03/12/16  Yes Cassandria Anger, MD  isosorbide mononitrate (IMDUR) 30 MG 24 hr tablet Take 1 tablet (30 mg total) by mouth daily. 08/20/16  Yes Dayna N Dunn, PA-C  losartan (COZAAR) 100 MG tablet Take 1 tablet (100 mg total) by mouth daily. 06/02/16  Yes Evie Lacks Plotnikov, MD  metoprolol (LOPRESSOR) 25 MG tablet Take 1 tablet (25 mg total) by mouth 2 (two) times daily. 08/20/16  Yes Dayna N Dunn, PA-C  nitroGLYCERIN (NITROSTAT) 0.4 MG SL tablet Place 1 tablet (0.4 mg total) under the tongue every 5 (five) minutes as needed (up to 3 doses). For chest pain 08/20/16  Yes Dayna N Dunn, PA-C  oxyCODONE-acetaminophen (PERCOCET/ROXICET) 5-325 MG tablet Take 1-2 tablets by mouth every 6 (six) hours as needed for severe pain. 10/13/16  Yes Trula Slade, DPM  pioglitazone (ACTOS) 45 MG tablet Take 1 tablet (45 mg total) by mouth daily. 06/02/16  Yes Evie Lacks Plotnikov, MD  potassium chloride SA (K-DUR,KLOR-CON) 20 MEQ tablet Take 1 tablet (20 mEq total) by mouth daily. 08/20/16  Yes Dayna N Dunn, PA-C  pravastatin (PRAVACHOL) 40 MG tablet Take 1 tablet (40 mg total) by mouth daily. 06/02/16  Yes Cassandria Anger, MD  promethazine (PHENERGAN) 25 MG tablet Take 1 tablet (25 mg total) by mouth every 8 (eight) hours as needed for nausea or vomiting. 10/13/16  Yes Trula Slade, DPM  triamcinolone cream (KENALOG) 0.5 % Apply 1 application topically 2 (two) times daily as needed (rash). 04/18/15  Yes Evie Lacks Plotnikov, MD  vitamin B-12 (CYANOCOBALAMIN) 1000 MCG tablet Take 1,000 mcg by mouth daily.     Yes Historical Provider, MD    Physical Exam: Vitals:   10/16/16 2330 10/17/16 0000 10/17/16 0015 10/17/16 0300  BP:  (!) 93/45 122/61 (!) 117/41  Pulse:  82 85 88  Resp:  '22 19 20  ' Temp:      TempSrc:      SpO2: 99% 98% 99% 100%      Constitutional: NAD, calm, comfortable Eyes: PERRL, lids and conjunctivae normal ENMT: Mucous membranes are moist.  Posterior pharynx clear of any exudate or lesions.Normal dentition.  Neck: normal, supple, no masses, no thyromegaly Respiratory: clear to auscultation bilaterally, no wheezing, no crackles. Normal respiratory effort. No accessory muscle use.  Cardiovascular: Regular rate and rhythm, no murmurs / rubs / gallops. No extremity edema. 2+ pedal pulses. No carotid bruits.  Abdomen: no tenderness, no masses palpated. No hepatosplenomegaly. Bowel sounds positive.  Musculoskeletal: dressing C/D/I on R foot, sanguinous drainage in bulb. Skin: no rashes, lesions, ulcers. No induration Neurologic: CN 2-12 grossly intact. Sensation intact, DTR normal. Strength 5/5 in all 4.  Psychiatric: Normal judgment and insight. Alert and oriented x 3. Normal mood.    Labs on Admission: I have personally reviewed following labs and imaging studies  CBC:  Recent Labs Lab 10/13/16 1234 10/16/16 2327  WBC 4.4 10.5  NEUTROABS  --  8.1*  HGB 11.4* 10.5*  HCT 34.8* 32.3*  MCV 95.6 97.0  PLT 283 845   Basic Metabolic Panel:  Recent Labs Lab 10/13/16 1234 10/16/16 2327  NA 137 133*  K 4.5 4.6  CL 104 100*  CO2 26 27  GLUCOSE 106* 30*  BUN 13 17  CREATININE 0.90 1.20  CALCIUM 8.7* 8.1*   GFR: Estimated Creatinine Clearance: 88.5 mL/min (by C-G formula based on SCr of 1.2 mg/dL). Liver Function Tests:  Recent Labs Lab 10/16/16 2327  AST 24  ALT 11*  ALKPHOS 43  BILITOT 0.5  PROT 5.6*  ALBUMIN 2.7*   No results for input(s): LIPASE, AMYLASE in the last 168 hours. No results for input(s): AMMONIA in the last 168 hours. Coagulation Profile: No results for input(s): INR, PROTIME in the last 168 hours. Cardiac Enzymes: No results for input(s): CKTOTAL, CKMB, CKMBINDEX, TROPONINI in the last 168 hours. BNP (last 3 results) No results for input(s): PROBNP in the last 8760 hours. HbA1C: No results for input(s): HGBA1C in the last 72 hours. CBG:  Recent Labs Lab 10/16/16 2341 10/17/16 0006  10/17/16 0104 10/17/16 0133 10/17/16 0235  GLUCAP 78 65 83 79  58*   Lipid Profile: No results for input(s): CHOL, HDL, LDLCALC, TRIG, CHOLHDL, LDLDIRECT in the last 72 hours. Thyroid Function Tests: No results for input(s): TSH, T4TOTAL, FREET4, T3FREE, THYROIDAB in the last 72 hours. Anemia Panel: No results for input(s): VITAMINB12, FOLATE, FERRITIN, TIBC, IRON, RETICCTPCT in the last 72 hours. Urine analysis:    Component Value Date/Time   COLORURINE YELLOW 10/17/2016 0127   APPEARANCEUR CLEAR 10/17/2016 0127   LABSPEC 1.018 10/17/2016 0127   PHURINE 5.5 10/17/2016 0127   GLUCOSEU NEGATIVE 10/17/2016 0127   GLUCOSEU NEGATIVE 08/28/2010 0924   HGBUR NEGATIVE 10/17/2016 0127   BILIRUBINUR NEGATIVE 10/17/2016 0127   KETONESUR NEGATIVE 10/17/2016 0127   PROTEINUR NEGATIVE 10/17/2016 0127   UROBILINOGEN 1.0 07/30/2014 1827   NITRITE NEGATIVE 10/17/2016 0127   LEUKOCYTESUR SMALL (A) 10/17/2016 0127   Sepsis Labs: '@LABRCNTIP' (procalcitonin:4,lacticidven:4) )No results found for this or any previous visit (from the past 240 hour(s)).   Radiological Exams on Admission: Dg Chest 2 View  Result Date: 10/17/2016 CLINICAL DATA:  Dyspnea. EXAM: CHEST  2 VIEW COMPARISON:  08/15/2016 FINDINGS: The lungs are clear. Very small pleural effusion, probably on the left. No airspace consolidation. Normal pulmonary vasculature. Hilar and mediastinal contours are normal and unchanged. IMPRESSION: Small left pleural effusion.  No airspace consolidation. Electronically Signed   By: Andreas Newport M.D.   On: 10/17/2016 00:59   Ct Head Wo Contrast  Result Date: 10/17/2016 CLINICAL DATA:  Acute onset of altered mental status. Initial encounter. EXAM: CT HEAD WITHOUT CONTRAST TECHNIQUE: Contiguous axial images were obtained from the base of the skull through the vertex without intravenous contrast. COMPARISON:  CT of the head performed 06/11/2014 FINDINGS: Brain: No evidence of acute infarction,  hemorrhage, hydrocephalus, extra-axial collection or mass lesion/mass effect. Prominence of the ventricles and sulci reflects moderate cortical volume loss. Cerebellar atrophy is noted. 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 The brainstem and fourth ventricle are within normal limits. The basal ganglia are unremarkable in appearance. No mass effect or midline shift is seen. Vascular: No hyperdense vessel or unexpected calcification. Skull: There is no evidence of fracture; visualized osseous structures are unremarkable in appearance. Sinuses/Orbits: The orbits are within normal limits. The paranasal sinuses and mastoid air cells are well-aerated. Other: No significant soft tissue abnormalities are seen. IMPRESSION: 1. No acute intracranial pathology seen on CT. 2. Moderate cortical volume loss noted. 3. 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 Electronically Signed   By: Garald Balding M.D.   On: 10/17/2016 01:09   Dg Foot Complete Right  Result Date: 10/17/2016 CLINICAL DATA:  Foot amputation on 10/13/2016. Pain in the right foot all day today. EXAM: RIGHT FOOT COMPLETE - 3+ VIEW COMPARISON:  10/13/2016 FINDINGS: Transmetatarsal amputation. Soft tissue drain and skin staples are present. No soft tissue gas. No bony destruction. No acute findings are evident. IMPRESSION: Expected appearances after transmetatarsal amputation. No soft tissue gas. Electronically Signed   By: Andreas Newport M.D.   On: 10/17/2016 01:00    EKG: Independently reviewed.  Assessment/Plan Principal Problem:   Hypoglycemia associated with diabetes ([place]) Active Problems:   Essential hypertension   Hypoglycemia    1. Hypoglycemia associated with DM- 1. Hold DM meds 2. D10 gtt at 100 cc/hr 3. BMP at 0500 and again at 1200 to monitor sodium and  potassium 4. Hypoglycemia protocol 5. Q1H CBG checks 2.  HTN - holding BP meds other than metoprolol (so A.Fib dosent go in to RVR) this morning due to initially low BPs in ED 3. S/p trans-met of R foot - 1. Dressing C/D/I 2. No SIRS to suggest systemic infection at this point 3. Will continue the cipro/clinda that he has been put on by [facility] for now 4. Bandage scheduled for first change on Monday.   DVT prophylaxis: Lovenox Code Status: Full Family Communication: Wife at bedside Consults called: None Admission status: Admit to obs   GARDNER, [place] [facility] Pager [phone] from 7PM-7AM  If 7AM-7PM, please contact the day physician for the patient [email or web address] Password [email or web address]  10/17/2016, 3:15 AM

## 2016-10-18 ENCOUNTER — Encounter: Payer: Medicare Other | Admitting: Podiatry

## 2016-10-18 ENCOUNTER — Telehealth: Payer: Self-pay | Admitting: Podiatry

## 2016-10-18 LAB — BASIC METABOLIC PANEL
Anion gap: 8 (ref 5–15)
BUN: 15 mg/dL (ref 6–20)
CHLORIDE: 101 mmol/L (ref 101–111)
CO2: 25 mmol/L (ref 22–32)
Calcium: 8.3 mg/dL — ABNORMAL LOW (ref 8.9–10.3)
Creatinine, Ser: 1.01 mg/dL (ref 0.61–1.24)
GFR calc Af Amer: >60 mL/min (ref >60)
GLUCOSE: 190 mg/dL — AB (ref 65–99)
POTASSIUM: 5.1 mmol/L (ref 3.5–5.1)
Sodium: 134 mmol/L — ABNORMAL LOW (ref 135–145)

## 2016-10-18 LAB — MAGNESIUM: Magnesium: 1.9 mg/dL (ref 1.7–2.4)

## 2016-10-18 LAB — GLUCOSE, CAPILLARY
GLUCOSE-CAPILLARY: 194 mg/dL — AB (ref 65–99)
GLUCOSE-CAPILLARY: 228 mg/dL — AB (ref 65–99)
Glucose-Capillary: 166 mg/dL — ABNORMAL HIGH (ref 65–99)
Glucose-Capillary: 168 mg/dL — ABNORMAL HIGH (ref 65–99)
Glucose-Capillary: 180 mg/dL — ABNORMAL HIGH (ref 65–99)
Glucose-Capillary: 189 mg/dL — ABNORMAL HIGH (ref 65–99)

## 2016-10-18 LAB — CBC WITH DIFFERENTIAL/PLATELET
Basophils Absolute: 0 10*3/uL (ref 0.0–0.1)
Basophils Relative: 0 %
Eosinophils Absolute: 0.4 10*3/uL (ref 0.0–0.7)
Eosinophils Relative: 5 %
HEMATOCRIT: 30.9 % — AB (ref 39.0–52.0)
Hemoglobin: 10 g/dL — ABNORMAL LOW (ref 13.0–17.0)
LYMPHS PCT: 11 %
Lymphs Abs: 0.9 10*3/uL (ref 0.7–4.0)
MCH: 31.2 pg (ref 26.0–34.0)
MCHC: 32.4 g/dL (ref 30.0–36.0)
MCV: 96.3 fL (ref 78.0–100.0)
MONO ABS: 0.9 10*3/uL (ref 0.1–1.0)
MONOS PCT: 11 %
NEUTROS ABS: 5.7 10*3/uL (ref 1.7–7.7)
Neutrophils Relative %: 73 %
Platelets: 201 10*3/uL (ref 150–400)
RBC: 3.21 MIL/uL — ABNORMAL LOW (ref 4.22–5.81)
RDW: 15.7 % — AB (ref 11.5–15.5)
WBC: 7.9 10*3/uL (ref 4.0–10.5)

## 2016-10-18 LAB — URINE CULTURE

## 2016-10-18 MED ORDER — INSULIN ASPART 100 UNIT/ML ~~LOC~~ SOLN: 0.0000 [IU] | Freq: Every day | SUBCUTANEOUS | Status: DC

## 2016-10-18 MED ORDER — ACETAMINOPHEN 325 MG PO TABS: 650.0000 mg | ORAL_TABLET | Freq: Four times a day (QID) | ORAL | Status: DC | PRN

## 2016-10-18 MED ORDER — TRAMADOL HCL 50 MG PO TABS: 50.0000 mg | ORAL_TABLET | Freq: Four times a day (QID) | ORAL | Status: DC | PRN

## 2016-10-18 MED ORDER — INSULIN ASPART 100 UNIT/ML ~~LOC~~ SOLN
0.0000 [IU] | Freq: Three times a day (TID) | SUBCUTANEOUS | Status: DC
  Administered 2016-10-18: 3 [IU] via SUBCUTANEOUS
  Administered 2016-10-18: 2 [IU] via SUBCUTANEOUS
  Administered 2016-10-19: 1 [IU] via SUBCUTANEOUS

## 2016-10-18 NOTE — Telephone Encounter (Addendum)
Pt's wife state pt is in hospital. Informed pt's wife, Joaquim Lai that Dr. Jacqualyn Posey said to have the nurse order a podiatry consult with him and he would be able to come see him. Joaquim Lai states understanding.

## 2016-10-18 NOTE — Telephone Encounter (Signed)
Pts wife called and pt will not make his appt today because he is in [place] because of his blood sugar dropping. Pts wife staes he will be in room [place] room 2 later this afternoon. Pts wife was not sure if you could see him there this afternoon or not.

## 2016-10-18 NOTE — Evaluation (Signed)
Physical Therapy Evaluation Patient Details Name: Charles [name] MRN: [id number] DOB: 08-27-1941 Today's Date: 10/18/2016   History of Present Illness  Pt is a 75 y/o male with PMH of CVA, DM, CHF, HLD, and PAF admitted from home with persistent hypoglycemia following R transmet amputation.  Clinical Impression  Pt overall supervision for all mobility, though decreased ability to maintain NWB through RLE.  Per pt, surgeon told him "stay off of it as much as possible and don't put any more weight than you have to."  PT provided education regarding weightbearing restrictions, safe DME use, and safe mobility at home.      Follow Up Recommendations No PT follow up;Supervision for mobility/OOB    Equipment Recommendations  Other (comment) (bariatric walker)    Recommendations for Other Services       Precautions / Restrictions Precautions Precautions: Fall Restrictions Weight Bearing Restrictions: Yes RLE Weight Bearing: Non weight bearing Other Position/Activity Restrictions: walking boot on RLE      Mobility  Bed Mobility Overal bed mobility: Modified Independent             General bed mobility comments: uses bed rails, reports sleeping in a recliner at home  Transfers Overall transfer level: Needs assistance Equipment used: Rolling walker (2 wheeled) Transfers: Sit to/from Stand Sit to Stand: Supervision         General transfer comment: verbal cues for hand placement   Ambulation/Gait Ambulation/Gait assistance: Supervision Ambulation Distance (Feet): 15 Feet Assistive device: Rolling walker (2 wheeled) Gait Pattern/deviations: Step-to pattern Gait velocity: slow   General Gait Details: not able to maintain NWB on RLE  Stairs            Wheelchair Mobility    Modified Rankin (Stroke Patients Only)       Balance Overall balance assessment: Needs assistance Sitting-balance support: No upper extremity supported Sitting balance-Leahy Scale:  Fair     Standing balance support: Bilateral upper extremity supported Standing balance-Leahy Scale: Poor Standing balance comment: uses RW                             Pertinent Vitals/Pain Pain Assessment: No/denies pain    Home Living Family/patient expects to be discharged to:: Private residence Living Arrangements: Spouse/significant other Available Help at Discharge: Family;Available 24 hours/day Type of Home: House Home Access: Stairs to enter Entrance Stairs-Rails:  (grab bar on the R) Entrance Stairs-Number of Steps: 2 Home Layout: Two level;Able to live on main level with bedroom/bathroom        Prior Function Level of Independence: Needs assistance   Gait / Transfers Assistance Needed: Mod I for ambulation in the home with RW           Hand Dominance        Extremity/Trunk Assessment   Upper Extremity Assessment: Defer to OT evaluation           Lower Extremity Assessment: Overall WFL for tasks assessed         Communication   Communication: HOH  Cognition Arousal/Alertness: Awake/alert Behavior During Therapy: WFL for tasks assessed/performed Overall Cognitive Status: Within Functional Limits for tasks assessed                      General Comments      Exercises     Assessment/Plan    PT Assessment Patent does not need any further PT services  PT Problem List  PT Treatment Interventions      PT Goals (Current goals can be found in the Care Plan section)  Acute Rehab PT Goals Patient Stated Goal: to go back home PT Goal Formulation: With patient Time For Goal Achievement: 10/18/16 Potential to Achieve Goals: Good    Frequency     Barriers to discharge        Co-evaluation               End of Session Equipment Utilized During Treatment: Gait belt;Other (comment) (RLE boot) Activity Tolerance: Patient tolerated treatment well Patient left: in bed;with call bell/phone within  reach;with bed alarm set;with family/visitor present Nurse Communication: Mobility status;Patient requests pain meds;Precautions         Time: [id number] PT Time Calculation (min) (ACUTE ONLY): 30 min   Charges:   PT Evaluation $PT Eval Low Complexity: 1 Procedure PT Treatments $Gait Training: 8-22 mins   PT G Codes:        Charles [name] 10/18/2016, 5:01 PM

## 2016-10-18 NOTE — Progress Notes (Signed)
[place] TEAM 1 - Stepdown/ICU TEAM  RUSHTON EARLY  [id number] DOB: 10/19/1941 DOA: 10/16/2016 PCP: [name], MD    Brief Narrative:  75yo M Hx, Stroke, DM2, Chronic diastolic CHF, Essential hypertension, HLD, PAF not on anticoag at his own choice, S/P recent trans-met amputation of the R foot who presented to the ED with persisting hypoglycemia for 24+ hrs.  His spouse called EMS and the patient was given D50.  His mental status improved and he refused to come to the hospital. His wife then battled low CBGs all day. EMS was called again and the patient transported to ED where he was hypoglycemic and hypotensive.  The patient was taking Amaryl, Actos, and insulin at home.  Subjective: Awake and grumpy.  Wants to go home.  Denies cp, sob, n/v, or abdom pain.  States he feels weak in general.  Some modest pain in his R foot.    Assessment & Plan:  Hypoglycemia associated with over use of diabetic medication Dr. Sherral Hammers counseled pt on danger of overmedicating with DM meds to include coma and death - CBG now stable on dextrose via IV - stop dextrose today and follow CBG - possible d/c tomorrow if remains stable w/o IV dextrose support   Hypotension in setting of chronic HTN BP stable off all BP meds - cont to follow w/o change today   PAF Rate controlled - not on anticoag by his own choice    S/p trans-met of R foot Ask PT/OT to eval - to f/u w/ Podiatry after d/c   CAD Asymptomatic   Chronic diastolic CHF No signif overload on exam at this time   [facility] Weights   10/17/16 0537  Weight: (!) 167.1 kg (368 lb 6.2 oz)    Obesity - Body mass index is 46.05 kg/m.   DVT prophylaxis: lovenox  Code Status: FULL CODE Family Communication: no family present at time of exam  Disposition Plan: transfer to med bed - follow CBG - follow up PT/OT recs - possible d/c 11/7 if CBG and BP stable and safe for d/c home per PT/OT   Consultants:   none  Procedures: none  Antimicrobials:  none   Objective: Blood pressure (!) 122/49, pulse 91, temperature 97.6 F (36.4 C), temperature source Oral, resp. rate 15, height _0  (1.905 m), weight (!) 167.1 kg (368 lb 6.2 oz), SpO2 100 %.  Intake/Output Summary (Last 24 hours) at 10/18/16 1101 Last data filed at 10/18/16 0802  Gross per 24 hour  Intake          1716.67 ml  Output             2360 ml  Net          -643.33 ml   Filed Weights   10/17/16 0537  Weight: (!) 167.1 kg (368 lb 6.2 oz)    Examination: General: No acute respiratory distress Lungs: Clear to auscultation bilaterally without wheezes or crackles Cardiovascular: Regular rate and rhythm without murmur gallop or rub normal S1 and S2 Abdomen: Nontender, nondistended, soft, bowel sounds positive, no rebound, no ascites, no appreciable mass Extremities: No significant cyanosis, clubbing, or edema bilateral lower extremities  CBC:  Recent Labs Lab 10/13/16 1234 10/16/16 2327 10/18/16 0246  WBC 4.4 10.5 7.9  NEUTROABS  --  8.1* 5.7  HGB 11.4* 10.5* 10.0*  HCT 34.8* 32.3* 30.9*  MCV 95.6 97.0 96.3  PLT 283 234 425   Basic Metabolic Panel:  Recent Labs Lab 10/13/16  1234 10/16/16 2327 10/17/16 0712 10/17/16 1139 10/18/16 0246  NA 137 133* 132* 132* 134*  K 4.5 4.6 4.7 5.2* 5.1  CL 104 100* 100* 101 101  CO2 _0 GLUCOSE 106* 30* 82 129* 190*  BUN _1 CREATININE 0.90 1.20 1.03 1.02 1.01  CALCIUM 8.7* 8.1* 8.0* 8.1* 8.3*  MG  --   --   --   --  1.9   GFR: Estimated Creatinine Clearance: 105 mL/min (by C-G formula based on SCr of 1.01 mg/dL).  Liver Function Tests:  Recent Labs Lab 10/16/16 2327  AST 24  ALT 11*  ALKPHOS 43  BILITOT 0.5  PROT 5.6*  ALBUMIN 2.7*    HbA1C: Hgb A1c MFr Bld  Date/Time Value Ref Range Status  08/16/2016 04:48 AM 6.3 (H) 4.8 - 5.6 % Final    Comment:    (NOTE)         Pre-diabetes: 5.7 - 6.4         Diabetes: >6.4          Glycemic control for adults with diabetes: <7.0   06/02/2016 03:57 PM 6.2 4.6 - 6.5 % Final    Comment:    Glycemic Control Guidelines for People with Diabetes:Non Diabetic:  <6%Goal of Therapy: <7%Additional Action Suggested:  >8%     CBG:  Recent Labs Lab 10/17/16 1657 10/17/16 2014 10/18/16 0002 10/18/16 0448 10/18/16 0803  GLUCAP 170* 179* 189* 180* 166*    Recent Results (from the past 240 hour(s))  Urine culture     Status: Abnormal   Collection Time: 10/17/16  1:27 AM  Result Value Ref Range Status   Specimen Description URINE, RANDOM  Final   Special Requests NONE  Final   Culture <10,000 COLONIES/mL INSIGNIFICANT GROWTH (A)  Final   Report Status 10/18/2016 FINAL  Final  MRSA PCR Screening     Status: None   Collection Time: 10/17/16  5:52 AM  Result Value Ref Range Status   MRSA by PCR NEGATIVE NEGATIVE Final    Comment:        The GeneXpert MRSA Assay (FDA approved for NASAL specimens only), is one component of a comprehensive MRSA colonization surveillance program. It is not intended to diagnose MRSA infection nor to guide or monitor treatment for MRSA infections.      Scheduled Meds: . aspirin EC  81 mg Oral Daily  . cholecalciferol  1,000 Units Oral Daily  . ciprofloxacin  500 mg Oral BID  . clindamycin  300 mg Oral TID  . clopidogrel  75 mg Oral Daily  . enoxaparin (LOVENOX) injection  80 mg Subcutaneous Q24H  . gabapentin  300 mg Oral BID  . mometasone-formoterol  2 puff Inhalation BID  . pravastatin  40 mg Oral Daily  . sodium chloride flush  3 mL Intravenous Q12H   Continuous Infusions: . dextrose 5 % and 0.9% NaCl 50 mL/hr at 10/18/16 0802     LOS: 1 day   Cherene Altes, MD [facility] Office  [phone] Pager - Text Page per Shea Evans as per below:  On-Call/Text Page:      [email or web address]      password [email or web address]  If 7PM-7AM, please contact night-coverage [email or web address] Password [email or web address] 10/18/2016, 11:01 AM

## 2016-10-18 NOTE — Telephone Encounter (Signed)
Can you see if they can call a consult. I will be happy to go see him either tonight or tomorrow.

## 2016-10-19 ENCOUNTER — Telehealth: Payer: Self-pay | Admitting: *Deleted

## 2016-10-19 DIAGNOSIS — I4891 Unspecified atrial fibrillation: Secondary | ICD-10-CM

## 2016-10-19 DIAGNOSIS — E11649 Type 2 diabetes mellitus with hypoglycemia without coma: Secondary | ICD-10-CM

## 2016-10-19 DIAGNOSIS — I10 Essential (primary) hypertension: Secondary | ICD-10-CM

## 2016-10-19 DIAGNOSIS — I25810 Atherosclerosis of coronary artery bypass graft(s) without angina pectoris: Secondary | ICD-10-CM

## 2016-10-19 LAB — GLUCOSE, CAPILLARY: Glucose-Capillary: 146 mg/dL — ABNORMAL HIGH (ref 65–99)

## 2016-10-19 NOTE — Telephone Encounter (Signed)
I am calling to let you know that Dr. Jacqualyn Posey wants Mr. Ngan to see Dr. Amalia Hailey tomorrow in the [place] office at 9 am.  "I'll try to get him up that early.  We'll be there."

## 2016-10-19 NOTE — Care Management Note (Signed)
Case Management Note  Patient Details  Name: Charles [name] MRN: [id number] Date of Birth: [birth year]  Subjective/Objective:                 Spoke to patient and wife at bedside. They decline HH services. Wife is a [employer] scientist, so is one son who lives outside the home and checks on frequently, they have another son who lives in the home who helps as well. Bariatric walker to be delivered to room prior to discharge.   Action/Plan:  DC to home with wife today.   Expected Discharge Date:                  Expected Discharge Plan:  Home/Self Care  In-House Referral:  NA  Discharge planning Services  CM Consult  Post Acute Care Choice:  Durable Medical Equipment Choice offered to:  Patient  DME Arranged:  Gilford Rile (bariatric) DME Agency:  [place]:  NA [place] Agency:  NA  Status of Service:  Completed, signed off  If discussed at [place] of Stay Meetings, dates discussed:    Additional Comments:  Carles Collet, RN 10/19/2016, 11:09 AM

## 2016-10-19 NOTE — Progress Notes (Signed)
Received callback from Dr. Vaughan Browner office will arrange for a follow-up with him on 11/8.

## 2016-10-19 NOTE — Telephone Encounter (Signed)
He is scheduled to see Dr. Amalia Hailey tomorrow.

## 2016-10-19 NOTE — Telephone Encounter (Signed)
"  Charles [name] was supposed to come in today to have the drain removed and the dressing removed.  He is in the hospital. He's at [facility] right now.  If you would give me a call back.  Thank you."  I am returning your call from yesterday.  How can I help you?  "Did you get my latest message?  Charles [name] is at home now.  So, my question is, is it okay for him to wait until Friday to see him or Monday or do we need to come in to see someone else to have the drain taken off?"  I will ask Dr. Jacqualyn Posey and let you know.  "We would even be willing to go to the [place] office if need be even though we prefer the [facility] because you all are like family.  Thanks so much.  I will call you back with a response.

## 2016-10-19 NOTE — Discharge Summary (Signed)
PATIENT DETAILS Name: Charles [name] Age: 75 y.o. Sex: male Date of Birth: 1941-09-23 MRN: [id number]. Admitting Physician: Etta Quill, DO OVZ:CHYI Plotnikov, MD  Admit Date: 10/16/2016 Discharge date: 10/19/2016  Recommendations for Outpatient Follow-up:  1. Follow up with PCP in 1 weeks 2. Follow with podiatrist-Dr. Jacqualyn [name] either today or tomorrow for further wound care needs. 3. Have discontinued Amaryl-have instructed patient to follow CBGs closely-and follow with PCP for further continued optimization of diabetic regimen 4. 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   Brief Hospital Course: Hypoglycemia associated with over use of diabetic medication: Resolved, extensive counseling regarding danger of overmedicating with DM meds to include coma and death has been done by this M.D. and prior MDs as well. CBGs are now stable off dextrose via intravenous route. Plans are to discharge home today, I have discontinued Amaryl, patient will continue on his usual sliding scale regimen and Actos. Have instructed spouse and patient to check CBGs 3-4 times a day and follow up with PCP for further optimization.   Hypotension in setting of chronic HTN: All antihypertensives were held on admission-the pressure is now stable. On discharge, we will resume Lasix and metoprolol. Imdur and losartan will be on  hold. Patient will follow up with PCP for further optimization.  FOY:DXAJ controlled - not on anticoag by his own choice    S/p trans-met of R foot: Seen by physical therapy services-does not require further PT follow-up. Have asked patient's spouse to take him to podiatry today or tomorrow.  Continue ciprofloxacin and clindamycin per podiatry. Note, have left message for Dr. Jacqualyn [name] in his office with my callback number.  OIN:[id number]   Chronic diastolic CHF:No overload on exam at this time, resume Lasix and low-dose metoprolol on discharge.  Obesity - Body mass index is 46.05 kg/m :  Procedures/Studies: none  Discharge Diagnoses:  Principal Problem:   Hypoglycemia associated with diabetes [facility]) Active Problems:   Essential hypertension   Hypoglycemia   Discharge Instructions:  Activity:  As tolerated with Full fall precautions use Charles/cane & assistance as needed  Discharge Instructions    (HEART FAILURE PATIENTS) Call MD:  Anytime you have any of the following symptoms: 1) 3 pound weight gain in 24 hours or 5 pounds in 1 week 2) shortness of breath, with or without a dry hacking cough 3) swelling in the hands, feet or stomach 4) if you have to sleep on extra pillows at night in order to breathe.    Complete by:  As directed    Call MD for:  redness, tenderness, or signs of infection (pain, swelling, redness, odor or green/yellow discharge around incision site)    Complete by:  As directed    Diet - low sodium heart healthy    Complete by:  As directed    Diet Carb Modified    Complete by:  As directed    Discharge instructions  Complete by:  As directed    Follow with Primary MD  Charles Kehr, MD and Dr Charles [name]  Check CBG's 3-4 times a day    Please get a complete blood count and chemistry panel checked by your Primary MD at your next visit, and again as instructed by your Primary MD.  Get Medicines reviewed and adjusted: Please take all your medications  with you for your next visit with your Primary MD  Laboratory/radiological data: Please request your Primary MD to go over all hospital tests and procedure/radiological results at the follow up, please ask your Primary MD to get all Hospital records sent to his/her office.  In some cases, they will be blood work, cultures and biopsy results pending at the time of your discharge. Please request that your primary care M.D. follows up on these results.  Also Note the following: If you experience worsening of your admission symptoms, develop shortness of breath, life threatening emergency, suicidal or homicidal thoughts you must seek medical attention immediately by calling 911 or calling your MD immediately  if symptoms less severe.  You must read complete instructions/literature along with all the possible adverse reactions/side effects for all the Medicines you take and that have been prescribed to you. Take any new Medicines after you have completely understood and accpet all the possible adverse reactions/side effects.   Do not drive when taking Pain medications or sleeping medications (Benzodaizepines)  Do not take more than prescribed Pain, Sleep and Anxiety Medications. It is not advisable to combine anxiety,sleep and pain medications without talking with your primary care practitioner  Special Instructions: If you have smoked or chewed Tobacco  in the last 2 yrs please stop smoking, stop any regular Alcohol  and or any Recreational drug use.  Wear Seat belts while driving.  Please note: You were cared for by a hospitalist during your hospital stay. Once you are discharged, your primary care physician will handle any further medical issues. Please note that NO REFILLS for any discharge medications will be authorized once you are discharged, as it is imperative that you return to your primary care physician (or establish a relationship with a primary care physician if you do not have one) for  your post hospital discharge needs so that they can reassess your need for medications and monitor your lab values.   Increase activity slowly    Complete by:  As directed        Medication List    STOP taking these medications   glimepiride 4 MG tablet Commonly known as:  AMARYL   isosorbide mononitrate 30 MG 24 hr tablet Commonly known as:  IMDUR   losartan 100 MG tablet Commonly known as:  COZAAR     TAKE these medications   aspirin EC 81 MG tablet Take 81 mg by mouth daily.   ciprofloxacin 500 MG tablet Commonly known as:  CIPRO Take 1 tablet (500 mg total) by mouth 2 (two) times daily.   clindamycin 300 MG capsule Commonly known as:  CLEOCIN Take 1 capsule (300 mg total) by mouth 3 (three) times daily.   clopidogrel 75 MG tablet Commonly known as:  PLAVIX Take 1 tablet (75 mg total) by mouth daily.   EQL VITAMIN D3 1000 units tablet Generic drug:  Cholecalciferol Take 1,000 Units by mouth daily.   Fluticasone-Salmeterol 100-50 MCG/DOSE Aepb Commonly known as:  ADVAIR DISKUS Inhale 1 puff into the lungs 2 (two) times daily. What changed:  when to take this  reasons to take this   furosemide 80 MG tablet Commonly known as:  LASIX Take 1/2 tablet daily alternating with whole tablet daily What changed:  how much to take  how to take this  when to take this  additional instructions   gabapentin 300 MG capsule Commonly known as:  NEURONTIN TAKE 1 CAPSULE THREE TIMES DAILY AS NEEDED FOR NERVE PAIN What changed:  how much to take  how to take this  when to take this  additional instructions   insulin aspart 100 UNIT/ML FlexPen Commonly known as:  NOVOLOG FLEXPEN Use 5-20 units four times a day after meals and at bedtime Per sliding scale 100-120 cbg What changed:  how much to take  how to take this  when to take this  additional instructions   metoprolol tartrate 25 MG tablet Commonly known as:  LOPRESSOR Take 1 tablet (25 mg  total) by mouth 2 (two) times daily.   nitroGLYCERIN 0.4 MG SL tablet Commonly known as:  NITROSTAT Place 1 tablet (0.4 mg total) under the tongue every 5 (five) minutes as needed (up to 3 doses). For chest pain   oxyCODONE-acetaminophen 5-325 MG tablet Commonly known as:  PERCOCET/ROXICET Take 1-2 tablets by mouth every 6 (six) hours as needed for severe pain.   pioglitazone 45 MG tablet Commonly known as:  ACTOS Take 1 tablet (45 mg total) by mouth daily.   potassium chloride SA 20 MEQ tablet Commonly known as:  K-DUR,KLOR-CON Take 1 tablet (20 mEq total) by mouth daily.   pravastatin 40 MG tablet Commonly known as:  PRAVACHOL Take 1 tablet (40 mg total) by mouth daily.   promethazine 25 MG tablet Commonly known as:  PHENERGAN Take 1 tablet (25 mg total) by mouth every 8 (eight) hours as needed for nausea or vomiting.   triamcinolone cream 0.5 % Commonly known as:  KENALOG Apply 1 application topically 2 (two) times daily as needed (rash).   vitamin B-12 1000 MCG tablet Commonly known as:  CYANOCOBALAMIN Take 1,000 mcg by mouth daily.      Follow-up [facility].   Why:  Bariatric RW to be delivered to room prior to DC Contact information: [address] [place] [address] [phone]        Charles Kehr, MD. Schedule an appointment as soon as possible for a visit in 1 week(s).   Specialty:  Internal Medicine Contact information: [place] [address] [phone]        Celesta Gentile, DPM. Schedule an appointment as soon as possible for a visit in 2 day(s).   Specialty:  Podiatry Contact information: [place] [phone]          Allergies  Allergen Reactions  . Xarelto [Rivaroxaban] Rash    Consultations:   None   Other Procedures/Studies: Dg Chest 2 View  Result Date: 10/17/2016 CLINICAL DATA:  Dyspnea. EXAM: CHEST  2 VIEW  COMPARISON:  08/15/2016 FINDINGS: The lungs are clear. Very small pleural effusion, probably on the left. No airspace consolidation. Normal pulmonary vasculature. Hilar and mediastinal contours are normal and unchanged. IMPRESSION: Small left pleural effusion.  No airspace consolidation. Electronically Signed   By: Andreas Newport M.D.   On: 10/17/2016 00:59   Ct Head Wo Contrast  Result Date: 10/17/2016 CLINICAL DATA:  Acute onset of altered mental status. Initial encounter. EXAM: CT HEAD WITHOUT CONTRAST TECHNIQUE: Contiguous axial images  were obtained from the base of the skull through the vertex without intravenous contrast. COMPARISON:  CT of the head performed 06/11/2014 FINDINGS: Brain: No evidence of acute infarction, hemorrhage, hydrocephalus, extra-axial collection or mass lesion/mass effect. Prominence of the ventricles and sulci reflects moderate cortical volume loss. Cerebellar atrophy is noted. 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 The brainstem and fourth ventricle are within normal limits. The basal ganglia are unremarkable in appearance. No mass effect or midline shift is seen. Vascular: No hyperdense vessel or unexpected calcification. Skull: There is no evidence of fracture; visualized osseous structures are unremarkable in appearance. Sinuses/Orbits: The orbits are within normal limits. The paranasal sinuses and mastoid air cells are well-aerated. Other: No significant soft tissue abnormalities are seen. IMPRESSION: 1. No acute intracranial pathology seen on CT. 2. Moderate cortical volume loss noted. 3. 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 Electronically Signed   By: Garald Balding M.D.   On: 10/17/2016 01:09   Dg Foot 2 Views Right  Result Date: 10/13/2016 CLINICAL DATA:  Right foot transmetatarsal  amputation EXAM: RIGHT FOOT - 2 VIEW COMPARISON:  09/20/2016 right foot radiographs FINDINGS: Interval transmetatarsal right foot amputation. Surgical drain and skin staples overlie the amputation margin. No cortical erosions or periosteal reaction in the remnant right foot. No appreciable soft tissue gas. No osseous fracture, dislocation or suspicious focal osseous lesion. Degenerative changes in the dorsal tarsal joints. Vascular calcifications are noted in the right ankle soft tissues. IMPRESSION: Expected postoperative appearance status post interval transmetatarsal right foot amputation. Electronically Signed   By: Ilona Sorrel M.D.   On: 10/13/2016 18:13   Dg Foot Complete Right  Result Date: 10/17/2016 CLINICAL DATA:  Foot amputation on 10/13/2016. Pain in the right foot all day today. EXAM: RIGHT FOOT COMPLETE - 3+ VIEW COMPARISON:  10/13/2016 FINDINGS: Transmetatarsal amputation. Soft tissue drain and skin staples are present. No soft tissue gas. No bony destruction. No acute findings are evident. IMPRESSION: Expected appearances after transmetatarsal amputation. No soft tissue gas. Electronically Signed   By: Andreas Newport M.D.   On: 10/17/2016 01:00     TODAY-DAY OF DISCHARGE:  Subjective:   Charles [name] today has no headache,no chest abdominal pain,no new weakness tingling or numbness, feels much better wants to go home today.  Objective:   Blood pressure (!) 128/45, pulse 100, temperature 98.1 F (36.7 C), temperature source Oral, resp. rate 18, height _0  (1.93 m), weight (!) 166.9 kg (368 lb), SpO2 98 %.  Intake/Output Summary (Last 24 hours) at 10/19/16 1055 Last data filed at 10/19/16 1016  Gross per 24 hour  Intake              150 ml  Output             2000 ml  Net            -1850 ml   Filed Weights   10/17/16 0537 10/18/16 1424  Weight: (!) 167.1 kg (368 lb 6.2 oz) (!) 166.9 kg (368 lb)    Exam: Awake Alert, Oriented *3, No new F.N deficits, Normal  affect [place].AT,PERRAL Supple Neck,No JVD, No cervical lymphadenopathy appriciated.  Symmetrical Chest wall movement, Good air movement bilaterally, CTAB RRR,No Gallops,Rubs or new Murmurs, No Parasternal Heave +ve B.Sounds, Abd Soft, Non tender, No organomegaly appriciated, No rebound -guarding or rigidity. No Cyanosis, Clubbing or edema,  No new Rash or bruise Right lower extremity in Cam boot-bandaged-drain in place-did not open   PERTINENT RADIOLOGIC STUDIES: Dg Chest 2 View  Result Date: 10/17/2016 CLINICAL DATA:  Dyspnea. EXAM: CHEST  2 VIEW COMPARISON:  08/15/2016 FINDINGS: The lungs are clear. Very small pleural effusion, probably on the left. No airspace consolidation. Normal pulmonary vasculature. Hilar and mediastinal contours are normal and unchanged. IMPRESSION: Small left pleural effusion.  No airspace consolidation. Electronically Signed   By: Andreas Newport M.D.   On: 10/17/2016 00:59   Ct Head Wo Contrast  Result Date: 10/17/2016 CLINICAL DATA:  Acute onset of altered mental status. Initial encounter. EXAM: CT HEAD WITHOUT CONTRAST TECHNIQUE: Contiguous axial images were obtained from the base of the skull through the vertex without intravenous contrast. COMPARISON:  CT of the head performed 06/11/2014 FINDINGS: Brain: No evidence of acute infarction, hemorrhage, hydrocephalus, extra-axial collection or mass lesion/mass effect. Prominence of the ventricles and sulci reflects moderate cortical volume loss. Cerebellar atrophy is noted. 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 The brainstem and fourth ventricle are within normal limits. The basal ganglia are unremarkable in appearance. No mass effect or midline shift is seen. Vascular: No hyperdense vessel or unexpected calcification. Skull: There is no evidence of fracture; visualized osseous structures are unremarkable in appearance.  Sinuses/Orbits: The orbits are within normal limits. The paranasal sinuses and mastoid air cells are well-aerated. Other: No significant soft tissue abnormalities are seen. IMPRESSION: 1. No acute intracranial pathology seen on CT. 2. Moderate cortical volume loss noted. 3. 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 Electronically Signed   By: Garald Balding M.D.   On: 10/17/2016 01:09   Dg Foot 2 Views Right  Result Date: 10/13/2016 CLINICAL DATA:  Right foot transmetatarsal amputation EXAM: RIGHT FOOT - 2 VIEW COMPARISON:  09/20/2016 right foot radiographs FINDINGS: Interval transmetatarsal right foot amputation. Surgical drain and skin staples overlie the amputation margin. No cortical erosions or periosteal reaction in the remnant right foot. No appreciable soft tissue gas. No osseous fracture, dislocation or suspicious focal osseous lesion. Degenerative changes in the dorsal tarsal joints. Vascular calcifications are noted in the right ankle soft tissues. IMPRESSION: Expected postoperative appearance status post interval transmetatarsal right foot amputation. Electronically Signed   By: Ilona Sorrel M.D.   On: 10/13/2016 18:13   Dg Foot Complete Right  Result Date: 10/17/2016 CLINICAL DATA:  Foot amputation on 10/13/2016. Pain in the right foot all day today. EXAM: RIGHT FOOT COMPLETE - 3+ VIEW COMPARISON:  10/13/2016 FINDINGS: Transmetatarsal amputation. Soft tissue drain and skin staples are present. No soft tissue gas. No bony destruction. No acute findings are evident. IMPRESSION: Expected appearances after transmetatarsal amputation. No soft tissue gas. Electronically Signed   By: Andreas Newport M.D.   On: 10/17/2016 01:00     PERTINENT LAB RESULTS: CBC:  Recent Labs  10/16/16 2327 10/18/16 0246  WBC 10.5 7.9  HGB 10.5* 10.0*  HCT 32.3* 30.9*  PLT 234 201   CMET CMP     Component Value Date/Time    NA 134 (L) 10/18/2016 0246   K 5.1 10/18/2016 0246   CL 101 10/18/2016 0246   CO2 25 10/18/2016 0246   GLUCOSE 190 (H) 10/18/2016 0246   GLUCOSE 136 (H) 12/22/2006 0827   BUN 15 10/18/2016 0246   CREATININE 1.01 10/18/2016 0246   CREATININE 1.17 08/30/2016  1014   CALCIUM 8.3 (L) 10/18/2016 0246   PROT 5.6 (L) 10/16/2016 2327   ALBUMIN 2.7 (L) 10/16/2016 2327   AST 24 10/16/2016 2327   ALT 11 (L) 10/16/2016 2327   ALKPHOS 43 10/16/2016 2327   BILITOT 0.5 10/16/2016 2327   GFRNONAA >60 10/18/2016 0246   GFRAA >60 10/18/2016 0246    GFR Estimated Creatinine Clearance: 106.2 mL/min (by C-G formula based on SCr of 1.01 mg/dL). No results for input(s): LIPASE, AMYLASE in the last 72 hours. No results for input(s): CKTOTAL, CKMB, CKMBINDEX, TROPONINI in the last 72 hours. Invalid input(s): POCBNP No results for input(s): DDIMER in the last 72 hours. No results for input(s): HGBA1C in the last 72 hours. No results for input(s): CHOL, HDL, LDLCALC, TRIG, CHOLHDL, LDLDIRECT in the last 72 hours. No results for input(s): TSH, T4TOTAL, T3FREE, THYROIDAB in the last 72 hours.  Invalid input(s): FREET3 No results for input(s): VITAMINB12, FOLATE, FERRITIN, TIBC, IRON, RETICCTPCT in the last 72 hours. Coags: No results for input(s): INR in the last 72 hours.  Invalid input(s): PT Microbiology: Recent Results (from the past 240 hour(s))  Urine culture     Status: Abnormal   Collection Time: 10/17/16  1:27 AM  Result Value Ref Range Status   Specimen Description URINE, RANDOM  Final   Special Requests NONE  Final   Culture <10,000 COLONIES/mL INSIGNIFICANT GROWTH (A)  Final   Report Status 10/18/2016 FINAL  Final  MRSA PCR Screening     Status: None   Collection Time: 10/17/16  5:52 AM  Result Value Ref Range Status   MRSA by PCR NEGATIVE NEGATIVE Final    Comment:        The GeneXpert MRSA Assay (FDA approved for NASAL specimens only), is one component of a comprehensive MRSA  colonization surveillance program. It is not intended to diagnose MRSA infection nor to guide or monitor treatment for MRSA infections.     FURTHER DISCHARGE INSTRUCTIONS:  Get Medicines reviewed and adjusted: Please take all your medications with you for your next visit with your Primary MD  Laboratory/radiological data: Please request your Primary MD to go over all hospital tests and procedure/radiological results at the follow up, please ask your Primary MD to get all Hospital records sent to his/her office.  In some cases, they will be blood work, cultures and biopsy results pending at the time of your discharge. Please request that your primary care M.D. goes through all the records of your hospital data and follows up on these results.  Also Note the following: If you experience worsening of your admission symptoms, develop shortness of breath, life threatening emergency, suicidal or homicidal thoughts you must seek medical attention immediately by calling 911 or calling your MD immediately  if symptoms less severe.  You must read complete instructions/literature along with all the possible adverse reactions/side effects for all the Medicines you take and that have been prescribed to you. Take any new Medicines after you have completely understood and accpet all the possible adverse reactions/side effects.   Do not drive when taking Pain medications or sleeping medications (Benzodaizepines)  Do not take more than prescribed Pain, Sleep and Anxiety Medications. It is not advisable to combine anxiety,sleep and pain medications without talking with your primary care practitioner  Special Instructions: If you have smoked or chewed Tobacco  in the last 2 yrs please stop smoking, stop any regular Alcohol  and or any Recreational drug use.  Wear Seat belts while  driving.  Please note: You were cared for by a hospitalist during your hospital stay. Once you are discharged, your primary  care physician will handle any further medical issues. Please note that NO REFILLS for any discharge medications will be authorized once you are discharged, as it is imperative that you return to your primary care physician (or establish a relationship with a primary care physician if you do not have one) for your post hospital discharge needs so that they can reassess your need for medications and monitor your lab values.  Total Time spent coordinating discharge including counseling, education and face to face time equals  45 minutes.  SignedOren Binet 10/19/2016 10:55 AM

## 2016-10-19 NOTE — Progress Notes (Signed)
OT Cancellation Note  Patient Details Name: Charles [name] MRN: [id number] DOB: 03/10/1941   Cancelled Treatment:    Reason Eval/Treat Not Completed: Other (comment). Pt being D/C home with wife. No acute needs.   [place], OTR/L  [id number] 10/19/2016 10/19/2016, 12:16 PM

## 2016-10-20 ENCOUNTER — Ambulatory Visit (INDEPENDENT_AMBULATORY_CARE_PROVIDER_SITE_OTHER): Payer: Medicare Other | Admitting: Podiatry

## 2016-10-20 DIAGNOSIS — Z9889 Other specified postprocedural states: Secondary | ICD-10-CM

## 2016-10-20 DIAGNOSIS — Z89431 Acquired absence of right foot: Secondary | ICD-10-CM

## 2016-10-21 ENCOUNTER — Telehealth: Payer: Self-pay | Admitting: *Deleted

## 2016-10-21 NOTE — Telephone Encounter (Signed)
Transition Care Management Follow-up Telephone Call   Date discharged? 10/19/16   How have you been since you were released from the hospital? Pt states he seem to be doing ok   Do you understand why you were in the hospital? YES   Do you understand the discharge instructions? YES  Where were you discharged to? HOME  Items Reviewed:  Medications reviewed: YES  Allergies reviewed: YES  Dietary changes reviewed: YES  Referrals reviewed: No referral needed   Functional Questionnaire:   Activities of Daily Living (ADLs):   He states he are independent in the following: bathing and hygiene, feeding, continence, grooming, toileting and dressing States he require assistance with the following: ambulation   Any transportation issues/concerns?: NO   Any patient concerns? NO   Confirmed importance and date/time of follow-up visits scheduled YES, pt states wife made appt already schedule for 10/29/16  Provider Appointment booked with [facility], np since dr plotnikov is not available;e  Confirmed with patient if condition begins to worsen call PCP or go to the ER.  Patient was given the office number and encouraged to call back with question or concerns.  : YES

## 2016-10-24 NOTE — Progress Notes (Signed)
Subjective: Patient presents the office status post 1 week transmetatarsal amputation right foot. Date of surgery 10/13/2016. Patient states that is feeling well.  Objective: Incision site is well coapted with sutures intact. Drain is intact.  Assessment: Status post 1 week transmetatarsal amputation right foot. Date of surgery 10/13/2016  Plan of care: Today the drain was pulled. Dressings were changed. Continue nonweightbearing to the right lower extremity. Keep dressings clean dry and intact. Return to clinic in 1 week

## 2016-10-25 ENCOUNTER — Ambulatory Visit (INDEPENDENT_AMBULATORY_CARE_PROVIDER_SITE_OTHER): Payer: Medicare Other | Admitting: Podiatry

## 2016-10-25 DIAGNOSIS — Z89431 Acquired absence of right foot: Secondary | ICD-10-CM

## 2016-10-25 DIAGNOSIS — E11621 Type 2 diabetes mellitus with foot ulcer: Secondary | ICD-10-CM

## 2016-10-25 DIAGNOSIS — E1140 Type 2 diabetes mellitus with diabetic neuropathy, unspecified: Secondary | ICD-10-CM

## 2016-10-25 DIAGNOSIS — L97529 Non-pressure chronic ulcer of other part of left foot with unspecified severity: Secondary | ICD-10-CM

## 2016-10-25 DIAGNOSIS — M86171 Other acute osteomyelitis, right ankle and foot: Secondary | ICD-10-CM

## 2016-10-25 DIAGNOSIS — Z9889 Other specified postprocedural states: Secondary | ICD-10-CM

## 2016-10-26 ENCOUNTER — Encounter: Payer: Self-pay | Admitting: Nurse Practitioner

## 2016-10-26 ENCOUNTER — Other Ambulatory Visit (INDEPENDENT_AMBULATORY_CARE_PROVIDER_SITE_OTHER): Payer: Medicare Other

## 2016-10-26 ENCOUNTER — Ambulatory Visit (INDEPENDENT_AMBULATORY_CARE_PROVIDER_SITE_OTHER): Payer: Medicare Other | Admitting: Nurse Practitioner

## 2016-10-26 VITALS — BP 140/86 | Temp 97.6°F | Ht 76.0 in | Wt 371.0 lb

## 2016-10-26 DIAGNOSIS — Z794 Long term (current) use of insulin: Secondary | ICD-10-CM

## 2016-10-26 DIAGNOSIS — I10 Essential (primary) hypertension: Secondary | ICD-10-CM

## 2016-10-26 DIAGNOSIS — I200 Unstable angina: Secondary | ICD-10-CM | POA: Diagnosis not present

## 2016-10-26 DIAGNOSIS — E11649 Type 2 diabetes mellitus with hypoglycemia without coma: Secondary | ICD-10-CM

## 2016-10-26 DIAGNOSIS — E1165 Type 2 diabetes mellitus with hyperglycemia: Secondary | ICD-10-CM | POA: Diagnosis not present

## 2016-10-26 DIAGNOSIS — E113399 Type 2 diabetes mellitus with moderate nonproliferative diabetic retinopathy without macular edema, unspecified eye: Secondary | ICD-10-CM

## 2016-10-26 LAB — CBC WITH DIFFERENTIAL/PLATELET
BASOS ABS: 0 10*3/uL (ref 0.0–0.1)
Basophils Relative: 0.6 % (ref 0.0–3.0)
EOS ABS: 0.2 10*3/uL (ref 0.0–0.7)
Eosinophils Relative: 3.2 % (ref 0.0–5.0)
HCT: 35.3 % — ABNORMAL LOW (ref 39.0–52.0)
Hemoglobin: 11.6 g/dL — ABNORMAL LOW (ref 13.0–17.0)
LYMPHS ABS: 0.8 10*3/uL (ref 0.7–4.0)
Lymphocytes Relative: 12.3 % (ref 12.0–46.0)
MCHC: 33 g/dL (ref 30.0–36.0)
MCV: 93.2 fl (ref 78.0–100.0)
MONO ABS: 0.4 10*3/uL (ref 0.1–1.0)
Monocytes Relative: 6 % (ref 3.0–12.0)
NEUTROS PCT: 77.9 % — AB (ref 43.0–77.0)
Neutro Abs: 5.2 10*3/uL (ref 1.4–7.7)
Platelets: 395 10*3/uL (ref 150.0–400.0)
RBC: 3.79 Mil/uL — AB (ref 4.22–5.81)
RDW: 15.1 % (ref 11.5–15.5)
WBC: 6.7 10*3/uL (ref 4.0–10.5)

## 2016-10-26 LAB — BASIC METABOLIC PANEL
BUN: 12 mg/dL (ref 6–23)
CHLORIDE: 104 meq/L (ref 96–112)
CO2: 29 meq/L (ref 19–32)
Calcium: 8.8 mg/dL (ref 8.4–10.5)
Creatinine, Ser: 0.83 mg/dL (ref 0.40–1.50)
GFR: 95.95 mL/min (ref >60.00)
Glucose, Bld: 123 mg/dL — ABNORMAL HIGH (ref 70–99)
Potassium: 4.6 mEq/L (ref 3.5–5.1)
SODIUM: 138 meq/L (ref 135–145)

## 2016-10-26 MED ORDER — ISOSORBIDE MONONITRATE ER 30 MG PO TB24: 30.0000 mg | ORAL_TABLET | Freq: Every day | ORAL | 6 refills | Status: DC

## 2016-10-26 MED ORDER — LOSARTAN POTASSIUM 100 MG PO TABS: 100.0000 mg | ORAL_TABLET | Freq: Every day | ORAL | 3 refills | Status: DC

## 2016-10-26 MED ORDER — GLIMEPIRIDE 4 MG PO TABS: 4.0000 mg | ORAL_TABLET | Freq: Two times a day (BID) | ORAL | 3 refills | Status: DC

## 2016-10-26 NOTE — Progress Notes (Signed)
Pre visit review using our clinic review tool, if applicable. No additional management support is needed unless otherwise documented below in the visit note. 

## 2016-10-26 NOTE — Patient Instructions (Signed)
Go to basement for blood draw. You will be called with lab results.  Resume Amaryl 4mg  as previously prescribed Continue blood sugar checks 3-4times a day, and record. Call office if glucose is staying above 250 with actos, amaryl and novolog.  Resume losartan first for 3days, if no hypotensive episode (BP<110/70), then resume Isosorbide as well.  Resume lasix and daily weight checks. Call cardiologist if weight gain of 3pounds in 3days or 5pounds in 1week. Maintain upcoming appt with cardiologist and Dr. Alain Marion

## 2016-10-26 NOTE — Progress Notes (Signed)
Subjective:  Patient ID: KYON BENTLER, male    DOB: Mar 18, 1941  Age: 75 y.o. MRN: [id number]  CC: Blood Sugar Problem (Pt stated taking pain medication causing his sugar to go down)   HPI 1 week hospital follow-up: Status post transmetatarsal amputation of right foot. Done 10/13/2016 Last office visit with podiatrist was 10/25/2016.  Returned to hospital via EMS 10/16/2016 due to syncope secondary to  hypoglycemia and hypotension. He reports blood sugar as low as 30s and 40s. Amaryl, losartan and Isosorbide was discontinued. Since discharge from hospital he has developed hyperglycemia, with home blood sugars ranging from 150s to 200s. He denies any change in appetite of diet since hospital discharge. He will like to resume medications that were discontinued.  Pain: Controlled with use of Tylenol and occasional ibuprofen. He is concerned hypoglycemic and hypotensive episodes were due to use of Percocet. Therefore he has stopped use of narcotics at this time.  HTN: He also experienced hypotension, which led to discontinuation of losartan and isosorbide. He does not check blood pressure at home at this time.  CHF: He has not been taking Lasix as prescribed since foot surgery, not checking his weight daily, due to inability to ambulate to bathroom to void. He denies any PND, or orthopnea or chest pain or palpitations.  Reviewed hospital labs results and radiology reports.  Outpatient Medications Prior to Visit  Medication Sig Dispense Refill  . aspirin EC 81 MG tablet Take 81 mg by mouth daily.    . Cholecalciferol (EQL VITAMIN D3) 1000 UNITS tablet Take 1,000 Units by mouth daily.      . ciprofloxacin (CIPRO) 500 MG tablet Take 1 tablet (500 mg total) by mouth 2 (two) times daily. 20 tablet 1  . clindamycin (CLEOCIN) 300 MG capsule Take 1 capsule (300 mg total) by mouth 3 (three) times daily. 30 capsule 2  . clopidogrel (PLAVIX) 75 MG tablet Take 1 tablet (75 mg total) by mouth  daily. 90 tablet 3  . Fluticasone-Salmeterol (ADVAIR DISKUS) 100-50 MCG/DOSE AEPB Inhale 1 puff into the lungs 2 (two) times daily. (Patient taking differently: Inhale 1 puff into the lungs 2 (two) times daily as needed (for shortness of breath). ) 3 each 3  . furosemide (LASIX) 80 MG tablet Take 1/2 tablet daily alternating with whole tablet daily (Patient taking differently: Take 40-80 mg by mouth See admin instructions. Take 1/2 tablet daily alternating with whole tablet daily) 90 tablet 3  . gabapentin (NEURONTIN) 300 MG capsule TAKE 1 CAPSULE THREE TIMES DAILY AS NEEDED FOR NERVE PAIN (Patient taking differently: Take 300 mg by mouth 2 (two) times daily. ) 270 capsule 1  . insulin aspart (NOVOLOG FLEXPEN) 100 UNIT/ML FlexPen Use 5-20 units four times a day after meals and at bedtime Per sliding scale 100-120 cbg (Patient taking differently: Inject 3-12 Units into the skin See admin instructions. Use 3-12 units four times a day after meals and at bedtime as needed per sliding scale 100-120 cbg) 75 mL 3  . metoprolol (LOPRESSOR) 25 MG tablet Take 1 tablet (25 mg total) by mouth 2 (two) times daily. 60 tablet 3  . nitroGLYCERIN (NITROSTAT) 0.4 MG SL tablet Place 1 tablet (0.4 mg total) under the tongue every 5 (five) minutes as needed (up to 3 doses). For chest pain 25 tablet 3  . oxyCODONE-acetaminophen (PERCOCET/ROXICET) 5-325 MG tablet Take 1-2 tablets by mouth every 6 (six) hours as needed for severe pain. 30 tablet 0  . pioglitazone (ACTOS) 45 MG  tablet Take 1 tablet (45 mg total) by mouth daily. 90 tablet 3  . potassium chloride SA (K-DUR,KLOR-CON) 20 MEQ tablet Take 1 tablet (20 mEq total) by mouth daily. 30 tablet 3  . pravastatin (PRAVACHOL) 40 MG tablet Take 1 tablet (40 mg total) by mouth daily. 90 tablet 3  . promethazine (PHENERGAN) 25 MG tablet Take 1 tablet (25 mg total) by mouth every 8 (eight) hours as needed for nausea or vomiting. 30 tablet 0  . triamcinolone cream (KENALOG) 0.5 %  Apply 1 application topically 2 (two) times daily as needed (rash). 30 g 3  . vitamin B-12 (CYANOCOBALAMIN) 1000 MCG tablet Take 1,000 mcg by mouth daily.       No facility-administered medications prior to visit.     ROS Review of Systems  Constitutional: Negative for chills, diaphoresis, fever, malaise/fatigue and weight loss.  Eyes: Negative for double vision and photophobia.  Respiratory: Negative for cough, shortness of breath and wheezing.   Cardiovascular: Negative for chest pain, palpitations and PND.  Gastrointestinal: Negative for abdominal pain, constipation, diarrhea, nausea and vomiting.  Genitourinary: Negative for dysuria, frequency and urgency.  Musculoskeletal: Positive for falls and joint pain. Negative for myalgias.  Skin: Negative.   Neurological: Positive for sensory change. Negative for dizziness, focal weakness, weakness and headaches.  Endo/Heme/Allergies: Negative for polydipsia.  Psychiatric/Behavioral: Negative for depression and suicidal ideas. The patient is not nervous/anxious and does not have insomnia.      Objective:  BP 140/86 (BP Location: Left Arm, Patient Position: Sitting, Cuff Size: Large)   Temp 97.6 F (36.4 C)   Ht _0  (1.93 m)   Wt (!) 371 lb (168.3 kg)   SpO2 98%   BMI 45.16 kg/m   BP Readings from Last 3 Encounters:  10/26/16 140/86  10/19/16 (!) 128/45  10/13/16 (!) 118/57    Wt Readings from Last 3 Encounters:  10/26/16 (!) 371 lb (168.3 kg)  10/18/16 (!) 368 lb (166.9 kg)  10/13/16 (!) 361 lb 4 oz (163.9 kg)    Physical Exam  Constitutional: He is oriented to person, place, and time.  Cardiovascular: Normal rate and normal heart sounds.   Pulmonary/Chest: Effort normal and breath sounds normal.  Neurological: He is alert and oriented to person, place, and time.  Skin:  Ambulates with a walker. Wound dressing on bilateral feet are clean, dry and intact.    Lab Results  Component Value Date   WBC 6.7 10/26/2016    HGB 11.6 (L) 10/26/2016   HCT 35.3 (L) 10/26/2016   PLT 395.0 10/26/2016   GLUCOSE 123 (H) 10/26/2016   CHOL 98 08/16/2016   TRIG 49 08/16/2016   HDL 33 (L) 08/16/2016   LDLCALC 55 08/16/2016   ALT 11 (L) 10/16/2016   AST 24 10/16/2016   NA 138 10/26/2016   K 4.6 10/26/2016   CL 104 10/26/2016   CREATININE 0.83 10/26/2016   BUN 12 10/26/2016   CO2 29 10/26/2016   TSH 1.19 04/18/2015   PSA 0.96 08/28/2010   INR 1.11 08/15/2016   HGBA1C 6.3 (H) 08/16/2016   MICROALBUR 1.4 08/28/2010    Dg Chest 2 View  Result Date: 10/17/2016 CLINICAL DATA:  Dyspnea. EXAM: CHEST  2 VIEW COMPARISON:  08/15/2016 FINDINGS: The lungs are clear. Very small pleural effusion, probably on the left. No airspace consolidation. Normal pulmonary vasculature. Hilar and mediastinal contours are normal and unchanged. IMPRESSION: Small left pleural effusion.  No airspace consolidation. Electronically Signed   By: Quillian Quince  Armandina Stammer M.D.   On: 10/17/2016 00:59   Ct Head Wo Contrast  Result Date: 10/17/2016 CLINICAL DATA:  Acute onset of altered mental status. Initial encounter. EXAM: CT HEAD WITHOUT CONTRAST TECHNIQUE: Contiguous axial images were obtained from the base of the skull through the vertex without intravenous contrast. COMPARISON:  CT of the head performed 06/11/2014 FINDINGS: Brain: No evidence of acute infarction, hemorrhage, hydrocephalus, extra-axial collection or mass lesion/mass effect. Prominence of the ventricles and sulci reflects moderate cortical volume loss. Cerebellar atrophy is noted. 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 The brainstem and fourth ventricle are within normal limits. The basal ganglia are unremarkable in appearance. No mass effect or midline shift is seen. Vascular: No hyperdense vessel or unexpected calcification. Skull: There is no evidence of fracture; visualized osseous structures are  unremarkable in appearance. Sinuses/Orbits: The orbits are within normal limits. The paranasal sinuses and mastoid air cells are well-aerated. Other: No significant soft tissue abnormalities are seen. IMPRESSION: 1. No acute intracranial pathology seen on CT. 2. Moderate cortical volume loss noted. 3. 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 Electronically Signed   By: Garald Balding M.D.   On: 10/17/2016 01:09   Dg Foot Complete Right  Result Date: 10/17/2016 CLINICAL DATA:  Foot amputation on 10/13/2016. Pain in the right foot all day today. EXAM: RIGHT FOOT COMPLETE - 3+ VIEW COMPARISON:  10/13/2016 FINDINGS: Transmetatarsal amputation. Soft tissue drain and skin staples are present. No soft tissue gas. No bony destruction. No acute findings are evident. IMPRESSION: Expected appearances after transmetatarsal amputation. No soft tissue gas. Electronically Signed   By: Andreas Newport M.D.   On: 10/17/2016 01:00    Assessment & Plan:   Govanni was seen today for blood sugar problem.  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  Hypoglycemia associated with diabetes ([place]) -     CBC w/Diff; Future -     Basic Metabolic Panel (BMET); Future  Unstable angina ([name]) -     isosorbide mononitrate (IMDUR) 30 MG 24 hr tablet; Take 1 tablet (30 mg total) by mouth daily.  Essential hypertension -     isosorbide mononitrate (IMDUR) 30 MG 24 hr tablet; Take 1 tablet (30 mg total) by mouth daily. -     losartan (COZAAR) 100 MG tablet; Take 1 tablet (100 mg total) by mouth  daily.   I am having Mr. Scoggin start on glimepiride, isosorbide mononitrate, and losartan. I am also having him maintain his vitamin B-12, Cholecalciferol, triamcinolone cream, Fluticasone-Salmeterol, insulin aspart, clopidogrel, furosemide, gabapentin, pioglitazone, pravastatin, metoprolol tartrate, potassium chloride SA, nitroGLYCERIN, clindamycin, ciprofloxacin, aspirin EC, oxyCODONE-acetaminophen, and promethazine.  Meds ordered this encounter  Medications  . glimepiride (AMARYL) 4 MG tablet    Sig: Take 1 tablet (4 mg total) by mouth 2 (two) times daily after a meal.    Dispense:  60 tablet    Refill:  3    Order Specific Question:   Supervising Provider    Answer:   Alain Marion,  ALEKSEI V [[id number]]  . isosorbide mononitrate (IMDUR) 30 MG 24 hr tablet    Sig: Take 1 tablet (30 mg total) by mouth daily.    Dispense:  30 tablet    Refill:  6    Order Specific Question:   Supervising Provider    Answer:   Cassandria Anger [[id number]]  . losartan (COZAAR) 100 MG tablet    Sig: Take 1 tablet (100 mg total) by mouth daily.    Dispense:  30 tablet    Refill:  3    Order Specific Question:   Supervising Provider    Answer:   Cassandria Anger [[id number]]   Recent Results (from the past 2160 hour(s))  Basic metabolic panel     Status: Abnormal   Collection Time: 08/15/16 11:35 AM  Result Value Ref Range   Sodium 139 135 - 145 mmol/L   Potassium 4.7 3.5 - 5.1 mmol/L   Chloride 107 101 - 111 mmol/L   CO2 29 22 - 32 mmol/L   Glucose, Bld 130 (H) 65 - 99 mg/dL   BUN 13 6 - 20 mg/dL   Creatinine, Ser 0.91 0.61 - 1.24 mg/dL   Calcium 9.2 8.9 - 10.3 mg/dL   GFR calc non Af Amer >60 >60 mL/min   GFR calc Af Amer >60 >60 mL/min    Comment: (NOTE) The eGFR has been calculated using the CKD EPI equation. This calculation has not been validated in all clinical situations. eGFR's persistently <60 mL/min signify possible Chronic Kidney Disease.    Anion gap 3 (L) 5 - 15  CBC     Status:  Abnormal   Collection Time: 08/15/16 11:35 AM  Result Value Ref Range   WBC 6.4 4.0 - 10.5 K/uL   RBC 4.18 (L) 4.22 - 5.81 MIL/uL   Hemoglobin 12.6 (L) 13.0 - 17.0 g/dL   HCT 42.1 39.0 - 52.0 %   MCV 100.7 (H) 78.0 - 100.0 fL   MCH 30.1 26.0 - 34.0 pg   MCHC 29.9 (L) 30.0 - 36.0 g/dL   RDW 15.4 11.5 - 15.5 %   Platelets 236 150 - 400 K/uL  Brain natriuretic peptide     Status: Abnormal   Collection Time: 08/15/16 11:35 AM  Result Value Ref Range   B Natriuretic Peptide 336.5 (H) 0.0 - 100.0 pg/mL  I-stat troponin, ED     Status: None   Collection Time: 08/15/16 11:51 AM  Result Value Ref Range   Troponin i, poc 0.04 0.00 - 0.08 ng/mL   Comment 3            Comment: Due to the release kinetics of cTnI, a negative result within the first hours of the onset of symptoms does not rule out myocardial infarction with certainty. If myocardial infarction is still suspected, repeat the test at appropriate intervals.   Glucose, capillary     Status: None   Collection Time: 08/15/16  4:32 PM  Result Value Ref Range   Glucose-Capillary 99 65 - 99 mg/dL  Troponin I     Status: Abnormal   Collection Time: 08/15/16  6:06 PM  Result Value Ref Range   Troponin I 0.03 (HH) <0.03 ng/mL    Comment: CRITICAL RESULT CALLED TO, READ BACK BY AND VERIFIED WITH: J.TSOUTIS,RN 08/15/16 _0  BY V.WILKINS   Protime-INR     Status: None   Collection Time: 08/15/16  6:06 PM  Result Value Ref Range   Prothrombin Time 14.3 11.4 - 15.2  seconds   INR 1.11   Glucose, capillary     Status: Abnormal   Collection Time: 08/15/16  9:29 PM  Result Value Ref Range   Glucose-Capillary 138 (H) 65 - 99 mg/dL  CBC     Status: Abnormal   Collection Time: 08/15/16 11:00 PM  Result Value Ref Range   WBC 5.4 4.0 - 10.5 K/uL   RBC 3.81 (L) 4.22 - 5.81 MIL/uL   Hemoglobin 11.4 (L) 13.0 - 17.0 g/dL   HCT 37.7 (L) 39.0 - 52.0 %   MCV 99.0 78.0 - 100.0 fL   MCH 29.9 26.0 - 34.0 pg   MCHC 30.2 30.0 - 36.0 g/dL   RDW  15.3 11.5 - 15.5 %   Platelets 191 150 - 400 K/uL  Troponin I     Status: Abnormal   Collection Time: 08/15/16 11:44 PM  Result Value Ref Range   Troponin I 0.04 (HH) <0.03 ng/mL    Comment: CRITICAL VALUE NOTED.  VALUE IS CONSISTENT WITH PREVIOUSLY REPORTED AND CALLED VALUE.  Heparin level (unfractionated)     Status: None   Collection Time: 08/15/16 11:44 PM  Result Value Ref Range   Heparin Unfractionated 0.50 0.30 - 0.70 IU/mL    Comment:        IF HEPARIN RESULTS ARE BELOW EXPECTED VALUES, AND PATIENT DOSAGE HAS BEEN CONFIRMED, SUGGEST FOLLOW UP TESTING OF ANTITHROMBIN III LEVELS.   Troponin I     Status: Abnormal   Collection Time: 08/16/16  4:48 AM  Result Value Ref Range   Troponin I 0.04 (HH) <0.03 ng/mL    Comment: CRITICAL VALUE NOTED.  VALUE IS CONSISTENT WITH PREVIOUSLY REPORTED AND CALLED VALUE.  Hemoglobin A1c     Status: Abnormal   Collection Time: 08/16/16  4:48 AM  Result Value Ref Range   Hgb A1c MFr Bld 6.3 (H) 4.8 - 5.6 %    Comment: (NOTE)         Pre-diabetes: 5.7 - 6.4         Diabetes: >6.4         Glycemic control for adults with diabetes: <7.0    Mean Plasma Glucose 134 mg/dL    Comment: (NOTE) Performed At: [facility] [place], [place] [id number] Lindon Romp MD TJ:[id number]   Basic metabolic panel     Status: Abnormal   Collection Time: 08/16/16  4:48 AM  Result Value Ref Range   Sodium 138 135 - 145 mmol/L   Potassium 3.9 3.5 - 5.1 mmol/L    Comment: DELTA CHECK NOTED   Chloride 107 101 - 111 mmol/L   CO2 27 22 - 32 mmol/L   Glucose, Bld 107 (H) 65 - 99 mg/dL   BUN 12 6 - 20 mg/dL   Creatinine, Ser 0.85 0.61 - 1.24 mg/dL   Calcium 8.6 (L) 8.9 - 10.3 mg/dL   GFR calc non Af Amer >60 >60 mL/min   GFR calc Af Amer >60 >60 mL/min    Comment: (NOTE) The eGFR has been calculated using the CKD EPI equation. This calculation has not been validated in all clinical situations. eGFR's persistently <60 mL/min  signify possible Chronic Kidney Disease.    Anion gap 4 (L) 5 - 15  Lipid panel     Status: Abnormal   Collection Time: 08/16/16  4:48 AM  Result Value Ref Range   Cholesterol 98 0 - 200 mg/dL   Triglycerides 49 <150 mg/dL   HDL 33 (L) >40  mg/dL   Total CHOL/HDL Ratio 3.0 RATIO   VLDL 10 0 - 40 mg/dL   LDL Cholesterol 55 0 - 99 mg/dL    Comment:        Total Cholesterol/HDL:CHD Risk Coronary Heart Disease Risk Table                     Men   Women  1/2 Average Risk   3.4   3.3  Average Risk       5.0   4.4  2 X Average Risk   9.6   7.1  3 X Average Risk  23.4   11.0        Use the calculated Patient Ratio above and the CHD Risk Table to determine the patient's CHD Risk.        ATP III CLASSIFICATION (LDL):  <100     mg/dL   Optimal  100-129  mg/dL   Near or Above                    Optimal  130-159  mg/dL   Borderline  160-189  mg/dL   High  >190     mg/dL   Very High   Heparin level (unfractionated)     Status: Abnormal   Collection Time: 08/16/16  4:49 AM  Result Value Ref Range   Heparin Unfractionated <0.10 (L) 0.30 - 0.70 IU/mL    Comment:        IF HEPARIN RESULTS ARE BELOW EXPECTED VALUES, AND PATIENT DOSAGE HAS BEEN CONFIRMED, SUGGEST FOLLOW UP TESTING OF ANTITHROMBIN III LEVELS. QUESTIONABLE RESULTS, RECOMMEND RECOLLECT TO Angelene Giovanni [id number] [address]   Glucose, capillary     Status: Abnormal   Collection Time: 08/16/16  7:36 AM  Result Value Ref Range   Glucose-Capillary 104 (H) 65 - 99 mg/dL  Heparin level (unfractionated)     Status: Abnormal   Collection Time: 08/16/16  7:50 AM  Result Value Ref Range   Heparin Unfractionated 0.71 (H) 0.30 - 0.70 IU/mL    Comment:        IF HEPARIN RESULTS ARE BELOW EXPECTED VALUES, AND PATIENT DOSAGE HAS BEEN CONFIRMED, SUGGEST FOLLOW UP TESTING OF ANTITHROMBIN III LEVELS.   Glucose, capillary     Status: Abnormal   Collection Time: 08/16/16 11:22 AM  Result Value Ref Range   Glucose-Capillary 102  (H) 65 - 99 mg/dL  Heparin level (unfractionated)     Status: None   Collection Time: 08/16/16  2:41 PM  Result Value Ref Range   Heparin Unfractionated 0.62 0.30 - 0.70 IU/mL    Comment:        IF HEPARIN RESULTS ARE BELOW EXPECTED VALUES, AND PATIENT DOSAGE HAS BEEN CONFIRMED, SUGGEST FOLLOW UP TESTING OF ANTITHROMBIN III LEVELS.   Glucose, capillary     Status: Abnormal   Collection Time: 08/16/16  4:36 PM  Result Value Ref Range   Glucose-Capillary 154 (H) 65 - 99 mg/dL  Glucose, capillary     Status: Abnormal   Collection Time: 08/16/16  8:14 PM  Result Value Ref Range   Glucose-Capillary 129 (H) 65 - 99 mg/dL  CBC     Status: Abnormal   Collection Time: 08/17/16  5:37 AM  Result Value Ref Range   WBC 5.3 4.0 - 10.5 K/uL   RBC 3.70 (L) 4.22 - 5.81 MIL/uL   Hemoglobin 11.1 (L) 13.0 - 17.0 g/dL   HCT 36.7 (L) 39.0 - 52.0 %  MCV 99.2 78.0 - 100.0 fL   MCH 30.0 26.0 - 34.0 pg   MCHC 30.2 30.0 - 36.0 g/dL   RDW 15.4 11.5 - 15.5 %   Platelets 186 150 - 400 K/uL  Heparin level (unfractionated)     Status: None   Collection Time: 08/17/16  5:37 AM  Result Value Ref Range   Heparin Unfractionated 0.54 0.30 - 0.70 IU/mL    Comment:        IF HEPARIN RESULTS ARE BELOW EXPECTED VALUES, AND PATIENT DOSAGE HAS BEEN CONFIRMED, SUGGEST FOLLOW UP TESTING OF ANTITHROMBIN III LEVELS.   Glucose, capillary     Status: Abnormal   Collection Time: 08/17/16  7:50 AM  Result Value Ref Range   Glucose-Capillary 61 (L) 65 - 99 mg/dL  Glucose, capillary     Status: Abnormal   Collection Time: 08/17/16  8:53 AM  Result Value Ref Range   Glucose-Capillary 125 (H) 65 - 99 mg/dL  Glucose, capillary     Status: Abnormal   Collection Time: 08/17/16 11:03 AM  Result Value Ref Range   Glucose-Capillary 105 (H) 65 - 99 mg/dL  Glucose, capillary     Status: None   Collection Time: 08/17/16  2:14 PM  Result Value Ref Range   Glucose-Capillary 78 65 - 99 mg/dL  Glucose, capillary     Status:  Abnormal   Collection Time: 08/17/16  4:19 PM  Result Value Ref Range   Glucose-Capillary 103 (H) 65 - 99 mg/dL  ECHOCARDIOGRAM COMPLETE     Status: Abnormal   Collection Time: 08/17/16  4:25 PM  Result Value Ref Range   Weight 5,768 oz   Height 76 in   BP 128/107 mmHg   LA vol 90 mL   Stroke v 105 ml   LVOT VTI 31.9 cm   LV e' LATERAL 9.68 cm/s   LA vol A4C 76.3 ml   LVOT peak grad rest 6 mmHg   E decel time 134 msec   LVOT diameter 21 mm   LVOT area 3.46 cm2   LVOT peak vel 122 cm/s   LVOT SV 110.00 mL   LV sys vol 58 21 - 61 mL   LV sys vol index 19.0 mL/m2   LV dias vol 163 (A) 62 - 150 mL   LV dias vol index 54.0 mL/m2   LA vol index 29.7 mL/m2   MV Dec 134    Simpson's disk 64.00    TDI e' medial 6.64    TDI e' lateral 9.68    TAPSE 22.50 mm  Glucose, capillary     Status: Abnormal   Collection Time: 08/17/16  8:37 PM  Result Value Ref Range   Glucose-Capillary 171 (H) 65 - 99 mg/dL  Glucose, capillary     Status: Abnormal   Collection Time: 08/17/16 10:59 PM  Result Value Ref Range   Glucose-Capillary 142 (H) 65 - 99 mg/dL  CBC     Status: Abnormal   Collection Time: 08/18/16  4:27 AM  Result Value Ref Range   WBC 4.8 4.0 - 10.5 K/uL   RBC 3.63 (L) 4.22 - 5.81 MIL/uL   Hemoglobin 11.0 (L) 13.0 - 17.0 g/dL   HCT 35.8 (L) 39.0 - 52.0 %   MCV 98.6 78.0 - 100.0 fL   MCH 30.3 26.0 - 34.0 pg   MCHC 30.7 30.0 - 36.0 g/dL   RDW 15.5 11.5 - 15.5 %   Platelets 194 150 - 400 K/uL  Basic  metabolic panel     Status: Abnormal   Collection Time: 08/18/16  4:27 AM  Result Value Ref Range   Sodium 138 135 - 145 mmol/L   Potassium 3.7 3.5 - 5.1 mmol/L   Chloride 101 101 - 111 mmol/L   CO2 26 22 - 32 mmol/L   Glucose, Bld 112 (H) 65 - 99 mg/dL   BUN 14 6 - 20 mg/dL   Creatinine, Ser 0.89 0.61 - 1.24 mg/dL   Calcium 8.0 (L) 8.9 - 10.3 mg/dL   GFR calc non Af Amer >60 >60 mL/min   GFR calc Af Amer >60 >60 mL/min    Comment: (NOTE) The eGFR has been calculated using  the CKD EPI equation. This calculation has not been validated in all clinical situations. eGFR's persistently <60 mL/min signify possible Chronic Kidney Disease.    Anion gap 11 5 - 15  Glucose, capillary     Status: Abnormal   Collection Time: 08/18/16  7:23 AM  Result Value Ref Range   Glucose-Capillary 107 (H) 65 - 99 mg/dL  Heparin level (unfractionated)     Status: None   Collection Time: 08/18/16  7:35 AM  Result Value Ref Range   Heparin Unfractionated 0.40 0.30 - 0.70 IU/mL    Comment:        IF HEPARIN RESULTS ARE BELOW EXPECTED VALUES, AND PATIENT DOSAGE HAS BEEN CONFIRMED, SUGGEST FOLLOW UP TESTING OF ANTITHROMBIN III LEVELS.   Glucose, capillary     Status: Abnormal   Collection Time: 08/18/16 11:16 AM  Result Value Ref Range   Glucose-Capillary 120 (H) 65 - 99 mg/dL  Glucose, capillary     Status: Abnormal   Collection Time: 08/18/16  4:46 PM  Result Value Ref Range   Glucose-Capillary 142 (H) 65 - 99 mg/dL  Glucose, capillary     Status: Abnormal   Collection Time: 08/18/16  9:13 PM  Result Value Ref Range   Glucose-Capillary 201 (H) 65 - 99 mg/dL  CBC     Status: Abnormal   Collection Time: 08/19/16  3:26 AM  Result Value Ref Range   WBC 5.5 4.0 - 10.5 K/uL   RBC 3.62 (L) 4.22 - 5.81 MIL/uL   Hemoglobin 11.0 (L) 13.0 - 17.0 g/dL   HCT 35.8 (L) 39.0 - 52.0 %   MCV 98.9 78.0 - 100.0 fL   MCH 30.4 26.0 - 34.0 pg   MCHC 30.7 30.0 - 36.0 g/dL   RDW 15.1 11.5 - 15.5 %   Platelets 172 150 - 400 K/uL  Heparin level (unfractionated)     Status: None   Collection Time: 08/19/16  3:26 AM  Result Value Ref Range   Heparin Unfractionated 0.44 0.30 - 0.70 IU/mL    Comment:        IF HEPARIN RESULTS ARE BELOW EXPECTED VALUES, AND PATIENT DOSAGE HAS BEEN CONFIRMED, SUGGEST FOLLOW UP TESTING OF ANTITHROMBIN III LEVELS.   Glucose, capillary     Status: Abnormal   Collection Time: 08/19/16  7:31 AM  Result Value Ref Range   Glucose-Capillary 100 (H) 65 - 99  mg/dL  Glucose, capillary     Status: Abnormal   Collection Time: 08/19/16 11:47 AM  Result Value Ref Range   Glucose-Capillary 108 (H) 65 - 99 mg/dL  POCT Activated clotting time     Status: None   Collection Time: 08/19/16 12:55 PM  Result Value Ref Range   Activated Clotting Time 593 seconds  Glucose, capillary  Status: Abnormal   Collection Time: 08/19/16  2:55 PM  Result Value Ref Range   Glucose-Capillary 118 (H) 65 - 99 mg/dL  Glucose, capillary     Status: Abnormal   Collection Time: 08/19/16  9:32 PM  Result Value Ref Range   Glucose-Capillary 202 (H) 65 - 99 mg/dL  CBC     Status: Abnormal   Collection Time: 08/20/16  2:53 AM  Result Value Ref Range   WBC 5.7 4.0 - 10.5 K/uL   RBC 3.48 (L) 4.22 - 5.81 MIL/uL   Hemoglobin 10.6 (L) 13.0 - 17.0 g/dL   HCT 33.9 (L) 39.0 - 52.0 %   MCV 97.4 78.0 - 100.0 fL   MCH 30.5 26.0 - 34.0 pg   MCHC 31.3 30.0 - 36.0 g/dL   RDW 15.3 11.5 - 15.5 %   Platelets 183 150 - 400 K/uL  Basic metabolic panel     Status: Abnormal   Collection Time: 08/20/16  2:53 AM  Result Value Ref Range   Sodium 137 135 - 145 mmol/L   Potassium 4.1 3.5 - 5.1 mmol/L   Chloride 104 101 - 111 mmol/L   CO2 27 22 - 32 mmol/L   Glucose, Bld 146 (H) 65 - 99 mg/dL   BUN 16 6 - 20 mg/dL   Creatinine, Ser 0.90 0.61 - 1.24 mg/dL   Calcium 8.3 (L) 8.9 - 10.3 mg/dL   GFR calc non Af Amer >60 >60 mL/min   GFR calc Af Amer >60 >60 mL/min    Comment: (NOTE) The eGFR has been calculated using the CKD EPI equation. This calculation has not been validated in all clinical situations. eGFR's persistently <60 mL/min signify possible Chronic Kidney Disease.    Anion gap 6 5 - 15  Glucose, capillary     Status: Abnormal   Collection Time: 08/20/16  6:06 AM  Result Value Ref Range   Glucose-Capillary 109 (H) 65 - 99 mg/dL  Basic metabolic panel     Status: Abnormal   Collection Time: 08/30/16 10:14 AM  Result Value Ref Range   Sodium 137 135 - 146 mmol/L    Potassium 4.9 3.5 - 5.3 mmol/L   Chloride 100 98 - 110 mmol/L   CO2 30 20 - 31 mmol/L   Glucose, Bld 155 (H) 65 - 99 mg/dL   BUN 25 7 - 25 mg/dL   Creat 1.17 0.70 - 1.18 mg/dL    Comment:   For patients > or = 75 years of age: The upper reference limit for Creatinine is approximately 13% higher for people identified as African-American.      Calcium 9.3 8.6 - 10.3 mg/dL  Brain natriuretic peptide     Status: None   Collection Time: 08/30/16 10:14 AM  Result Value Ref Range   Brain Natriuretic Peptide 84.5 <100 pg/mL    Comment:   BNP levels increase with age in the general population with the highest values seen in individuals greater than 68 years of age. Reference: Joellyn Rued Cardiol 2002; 79:892-11.     Glucose, capillary     Status: Abnormal   Collection Time: 10/13/16 12:22 PM  Result Value Ref Range   Glucose-Capillary 107 (H) 65 - 99 mg/dL  CBC     Status: Abnormal   Collection Time: 10/13/16 12:34 PM  Result Value Ref Range   WBC 4.4 4.0 - 10.5 K/uL   RBC 3.64 (L) 4.22 - 5.81 MIL/uL   Hemoglobin 11.4 (L) 13.0 - 17.0  g/dL   HCT 34.8 (L) 39.0 - 52.0 %   MCV 95.6 78.0 - 100.0 fL   MCH 31.3 26.0 - 34.0 pg   MCHC 32.8 30.0 - 36.0 g/dL   RDW 15.6 (H) 11.5 - 15.5 %   Platelets 283 150 - 400 K/uL  Basic metabolic panel     Status: Abnormal   Collection Time: 10/13/16 12:34 PM  Result Value Ref Range   Sodium 137 135 - 145 mmol/L   Potassium 4.5 3.5 - 5.1 mmol/L    Comment: HEMOLYSIS AT THIS LEVEL MAY AFFECT RESULT   Chloride 104 101 - 111 mmol/L   CO2 26 22 - 32 mmol/L   Glucose, Bld 106 (H) 65 - 99 mg/dL   BUN 13 6 - 20 mg/dL   Creatinine, Ser 0.90 0.61 - 1.24 mg/dL   Calcium 8.7 (L) 8.9 - 10.3 mg/dL   GFR calc non Af Amer >60 >60 mL/min   GFR calc Af Amer >60 >60 mL/min    Comment: (NOTE) The eGFR has been calculated using the CKD EPI equation. This calculation has not been validated in all clinical situations. eGFR's persistently <60 mL/min signify possible  Chronic Kidney Disease.    Anion gap 7 5 - 15  Glucose, capillary     Status: Abnormal   Collection Time: 10/13/16  3:22 PM  Result Value Ref Range   Glucose-Capillary 106 (H) 65 - 99 mg/dL  CBG monitoring, ED     Status: Abnormal   Collection Time: 10/16/16 11:12 PM  Result Value Ref Range   Glucose-Capillary 33 (LL) 65 - 99 mg/dL   Comment 1 Notify RN   CBC with Differential/Platelet     Status: Abnormal   Collection Time: 10/16/16 11:27 PM  Result Value Ref Range   WBC 10.5 4.0 - 10.5 K/uL   RBC 3.33 (L) 4.22 - 5.81 MIL/uL   Hemoglobin 10.5 (L) 13.0 - 17.0 g/dL   HCT 32.3 (L) 39.0 - 52.0 %   MCV 97.0 78.0 - 100.0 fL   MCH 31.5 26.0 - 34.0 pg   MCHC 32.5 30.0 - 36.0 g/dL   RDW 16.0 (H) 11.5 - 15.5 %   Platelets 234 150 - 400 K/uL   Neutrophils Relative % 76 %   Neutro Abs 8.1 (H) 1.7 - 7.7 K/uL   Lymphocytes Relative 12 %   Lymphs Abs 1.2 0.7 - 4.0 K/uL   Monocytes Relative 10 %   Monocytes Absolute 1.0 0.1 - 1.0 K/uL   Eosinophils Relative 2 %   Eosinophils Absolute 0.2 0.0 - 0.7 K/uL   Basophils Relative 0 %   Basophils Absolute 0.0 0.0 - 0.1 K/uL  Comprehensive metabolic panel     Status: Abnormal   Collection Time: 10/16/16 11:27 PM  Result Value Ref Range   Sodium 133 (L) 135 - 145 mmol/L   Potassium 4.6 3.5 - 5.1 mmol/L   Chloride 100 (L) 101 - 111 mmol/L   CO2 27 22 - 32 mmol/L   Glucose, Bld 30 (LL) 65 - 99 mg/dL    Comment: CRITICAL RESULT CALLED TO, READ BACK BY AND VERIFIED WITH: PRUETT,K RN 10/17/2016 0022 JORDANS    BUN 17 6 - 20 mg/dL   Creatinine, Ser 1.20 0.61 - 1.24 mg/dL   Calcium 8.1 (L) 8.9 - 10.3 mg/dL   Total Protein 5.6 (L) 6.5 - 8.1 g/dL   Albumin 2.7 (L) 3.5 - 5.0 g/dL   AST 24 15 - 41 U/L  ALT 11 (L) 17 - 63 U/L   Alkaline Phosphatase 43 38 - 126 U/L   Total Bilirubin 0.5 0.3 - 1.2 mg/dL   GFR calc non Af Amer 57 (L) >60 mL/min   GFR calc Af Amer >60 >60 mL/min    Comment: (NOTE) The eGFR has been calculated using the CKD EPI  equation. This calculation has not been validated in all clinical situations. eGFR's persistently <60 mL/min signify possible Chronic Kidney Disease.    Anion gap 6 5 - 15  I-stat troponin, ED     Status: None   Collection Time: 10/16/16 11:34 PM  Result Value Ref Range   Troponin i, poc 0.01 0.00 - 0.08 ng/mL   Comment 3            Comment: Due to the release kinetics of cTnI, a negative result within the first hours of the onset of symptoms does not rule out myocardial infarction with certainty. If myocardial infarction is still suspected, repeat the test at appropriate intervals.   I-Stat CG4 Lactic Acid, ED     Status: None   Collection Time: 10/16/16 11:36 PM  Result Value Ref Range   Lactic Acid, Venous 1.90 0.5 - 1.9 mmol/L  CBG monitoring, ED     Status: None   Collection Time: 10/16/16 11:41 PM  Result Value Ref Range   Glucose-Capillary 78 65 - 99 mg/dL  CBG monitoring, ED     Status: None   Collection Time: 10/17/16 12:06 AM  Result Value Ref Range   Glucose-Capillary 65 65 - 99 mg/dL  CBG monitoring, ED     Status: None   Collection Time: 10/17/16  1:04 AM  Result Value Ref Range   Glucose-Capillary 83 65 - 99 mg/dL  Urinalysis, Routine w reflex microscopic (not at [facility])     Status: Abnormal   Collection Time: 10/17/16  1:27 AM  Result Value Ref Range   Color, Urine YELLOW YELLOW   APPearance CLEAR CLEAR   Specific Gravity, Urine 1.018 1.005 - 1.030   pH 5.5 5.0 - 8.0   Glucose, UA NEGATIVE NEGATIVE mg/dL   Hgb urine dipstick NEGATIVE NEGATIVE   Bilirubin Urine NEGATIVE NEGATIVE   Ketones, ur NEGATIVE NEGATIVE mg/dL   Protein, ur NEGATIVE NEGATIVE mg/dL   Nitrite NEGATIVE NEGATIVE   Leukocytes, UA SMALL (A) NEGATIVE  Urine culture     Status: Abnormal   Collection Time: 10/17/16  1:27 AM  Result Value Ref Range   Specimen Description URINE, RANDOM    Special Requests NONE    Culture <10,000 COLONIES/mL INSIGNIFICANT GROWTH (A)    Report Status  10/18/2016 FINAL   Urine microscopic-add on     Status: Abnormal   Collection Time: 10/17/16  1:27 AM  Result Value Ref Range   Squamous Epithelial / LPF 0-5 (A) NONE SEEN   WBC, UA 6-30 0 - 5 WBC/hpf   RBC / HPF NONE SEEN 0 - 5 RBC/hpf   Bacteria, UA RARE (A) NONE SEEN   Casts HYALINE CASTS (A) NEGATIVE  CBG monitoring, ED     Status: None   Collection Time: 10/17/16  1:33 AM  Result Value Ref Range   Glucose-Capillary 79 65 - 99 mg/dL  CBG monitoring, ED     Status: Abnormal   Collection Time: 10/17/16  2:35 AM  Result Value Ref Range   Glucose-Capillary 58 (L) 65 - 99 mg/dL  CBG monitoring, ED     Status: None   Collection Time:  10/17/16  3:50 AM  Result Value Ref Range   Glucose-Capillary 94 65 - 99 mg/dL  CBG monitoring, ED     Status: Abnormal   Collection Time: 10/17/16  4:38 AM  Result Value Ref Range   Glucose-Capillary 55 (L) 65 - 99 mg/dL  Glucose, capillary     Status: None   Collection Time: 10/17/16  5:39 AM  Result Value Ref Range   Glucose-Capillary 83 65 - 99 mg/dL   Comment 1 Capillary Specimen    Comment 2 Notify RN   MRSA PCR Screening     Status: None   Collection Time: 10/17/16  5:52 AM  Result Value Ref Range   MRSA by PCR NEGATIVE NEGATIVE    Comment:        The GeneXpert MRSA Assay (FDA approved for NASAL specimens only), is one component of a comprehensive MRSA colonization surveillance program. It is not intended to diagnose MRSA infection nor to guide or monitor treatment for MRSA infections.   Glucose, capillary     Status: None   Collection Time: 10/17/16  6:51 AM  Result Value Ref Range   Glucose-Capillary 96 65 - 99 mg/dL   Comment 1 Capillary Specimen   Basic metabolic panel     Status: Abnormal   Collection Time: 10/17/16  7:12 AM  Result Value Ref Range   Sodium 132 (L) 135 - 145 mmol/L   Potassium 4.7 3.5 - 5.1 mmol/L   Chloride 100 (L) 101 - 111 mmol/L   CO2 25 22 - 32 mmol/L   Glucose, Bld 82 65 - 99 mg/dL   BUN 17 6 -  20 mg/dL   Creatinine, Ser 1.03 0.61 - 1.24 mg/dL   Calcium 8.0 (L) 8.9 - 10.3 mg/dL   GFR calc non Af Amer >60 >60 mL/min   GFR calc Af Amer >60 >60 mL/min    Comment: (NOTE) The eGFR has been calculated using the CKD EPI equation. This calculation has not been validated in all clinical situations. eGFR's persistently <60 mL/min signify possible Chronic Kidney Disease.    Anion gap 7 5 - 15  Glucose, capillary     Status: None   Collection Time: 10/17/16  8:10 AM  Result Value Ref Range   Glucose-Capillary 93 65 - 99 mg/dL   Comment 1 Capillary Specimen   Glucose, capillary     Status: Abnormal   Collection Time: 10/17/16  9:27 AM  Result Value Ref Range   Glucose-Capillary 106 (H) 65 - 99 mg/dL   Comment 1 Capillary Specimen   Glucose, capillary     Status: Abnormal   Collection Time: 10/17/16 11:14 AM  Result Value Ref Range   Glucose-Capillary 128 (H) 65 - 99 mg/dL   Comment 1 Capillary Specimen   Basic metabolic panel     Status: Abnormal   Collection Time: 10/17/16 11:39 AM  Result Value Ref Range   Sodium 132 (L) 135 - 145 mmol/L   Potassium 5.2 (H) 3.5 - 5.1 mmol/L   Chloride 101 101 - 111 mmol/L   CO2 26 22 - 32 mmol/L   Glucose, Bld 129 (H) 65 - 99 mg/dL   BUN 16 6 - 20 mg/dL   Creatinine, Ser 1.02 0.61 - 1.24 mg/dL   Calcium 8.1 (L) 8.9 - 10.3 mg/dL   GFR calc non Af Amer >60 >60 mL/min   GFR calc Af Amer >60 >60 mL/min    Comment: (NOTE) The eGFR has been calculated using the CKD  EPI equation. This calculation has not been validated in all clinical situations. eGFR's persistently <60 mL/min signify possible Chronic Kidney Disease.    Anion gap 5 5 - 15  Glucose, capillary     Status: Abnormal   Collection Time: 10/17/16 12:27 PM  Result Value Ref Range   Glucose-Capillary 131 (H) 65 - 99 mg/dL   Comment 1 Capillary Specimen   Glucose, capillary     Status: Abnormal   Collection Time: 10/17/16  4:57 PM  Result Value Ref Range   Glucose-Capillary 170  (H) 65 - 99 mg/dL   Comment 1 Capillary Specimen   Glucose, capillary     Status: Abnormal   Collection Time: 10/17/16  8:14 PM  Result Value Ref Range   Glucose-Capillary 179 (H) 65 - 99 mg/dL   Comment 1 Capillary Specimen   Glucose, capillary     Status: Abnormal   Collection Time: 10/18/16 12:02 AM  Result Value Ref Range   Glucose-Capillary 189 (H) 65 - 99 mg/dL   Comment 1 Capillary Specimen   Basic metabolic panel     Status: Abnormal   Collection Time: 10/18/16  2:46 AM  Result Value Ref Range   Sodium 134 (L) 135 - 145 mmol/L   Potassium 5.1 3.5 - 5.1 mmol/L   Chloride 101 101 - 111 mmol/L   CO2 25 22 - 32 mmol/L   Glucose, Bld 190 (H) 65 - 99 mg/dL   BUN 15 6 - 20 mg/dL   Creatinine, Ser 1.01 0.61 - 1.24 mg/dL   Calcium 8.3 (L) 8.9 - 10.3 mg/dL   GFR calc non Af Amer >60 >60 mL/min   GFR calc Af Amer >60 >60 mL/min    Comment: (NOTE) The eGFR has been calculated using the CKD EPI equation. This calculation has not been validated in all clinical situations. eGFR's persistently <60 mL/min signify possible Chronic Kidney Disease.    Anion gap 8 5 - 15  Magnesium     Status: None   Collection Time: 10/18/16  2:46 AM  Result Value Ref Range   Magnesium 1.9 1.7 - 2.4 mg/dL  CBC with Differential/Platelet     Status: Abnormal   Collection Time: 10/18/16  2:46 AM  Result Value Ref Range   WBC 7.9 4.0 - 10.5 K/uL   RBC 3.21 (L) 4.22 - 5.81 MIL/uL   Hemoglobin 10.0 (L) 13.0 - 17.0 g/dL   HCT 30.9 (L) 39.0 - 52.0 %   MCV 96.3 78.0 - 100.0 fL   MCH 31.2 26.0 - 34.0 pg   MCHC 32.4 30.0 - 36.0 g/dL   RDW 15.7 (H) 11.5 - 15.5 %   Platelets 201 150 - 400 K/uL   Neutrophils Relative % 73 %   Neutro Abs 5.7 1.7 - 7.7 K/uL   Lymphocytes Relative 11 %   Lymphs Abs 0.9 0.7 - 4.0 K/uL   Monocytes Relative 11 %   Monocytes Absolute 0.9 0.1 - 1.0 K/uL   Eosinophils Relative 5 %   Eosinophils Absolute 0.4 0.0 - 0.7 K/uL   Basophils Relative 0 %   Basophils Absolute 0.0 0.0 -  0.1 K/uL  Glucose, capillary     Status: Abnormal   Collection Time: 10/18/16  4:48 AM  Result Value Ref Range   Glucose-Capillary 180 (H) 65 - 99 mg/dL  Glucose, capillary     Status: Abnormal   Collection Time: 10/18/16  8:03 AM  Result Value Ref Range   Glucose-Capillary 166 (H) 65 -  99 mg/dL   Comment 1 Capillary Specimen   Glucose, capillary     Status: Abnormal   Collection Time: 10/18/16 11:33 AM  Result Value Ref Range   Glucose-Capillary 194 (H) 65 - 99 mg/dL   Comment 1 Capillary Specimen   Glucose, capillary     Status: Abnormal   Collection Time: 10/18/16  5:23 PM  Result Value Ref Range   Glucose-Capillary 228 (H) 65 - 99 mg/dL  Glucose, capillary     Status: Abnormal   Collection Time: 10/18/16  9:57 PM  Result Value Ref Range   Glucose-Capillary 168 (H) 65 - 99 mg/dL  Glucose, capillary     Status: Abnormal   Collection Time: 10/19/16  8:09 AM  Result Value Ref Range   Glucose-Capillary 146 (H) 65 - 99 mg/dL  CBC w/Diff     Status: Abnormal   Collection Time: 10/26/16  2:11 PM  Result Value Ref Range   WBC 6.7 4.0 - 10.5 K/uL   RBC 3.79 (L) 4.22 - 5.81 Mil/uL   Hemoglobin 11.6 (L) 13.0 - 17.0 g/dL   HCT 35.3 (L) 39.0 - 52.0 %   MCV 93.2 78.0 - 100.0 fl   MCHC 33.0 30.0 - 36.0 g/dL   RDW 15.1 11.5 - 15.5 %   Platelets 395.0 150.0 - 400.0 K/uL   Neutrophils Relative % 77.9 (H) 43.0 - 77.0 %   Lymphocytes Relative 12.3 12.0 - 46.0 %   Monocytes Relative 6.0 3.0 - 12.0 %   Eosinophils Relative 3.2 0.0 - 5.0 %   Basophils Relative 0.6 0.0 - 3.0 %   Neutro Abs 5.2 1.4 - 7.7 K/uL   Lymphs Abs 0.8 0.7 - 4.0 K/uL   Monocytes Absolute 0.4 0.1 - 1.0 K/uL   Eosinophils Absolute 0.2 0.0 - 0.7 K/uL   Basophils Absolute 0.0 0.0 - 0.1 K/uL  Basic Metabolic Panel (BMET)     Status: Abnormal   Collection Time: 10/26/16  2:11 PM  Result Value Ref Range   Sodium 138 135 - 145 mEq/L   Potassium 4.6 3.5 - 5.1 mEq/L   Chloride 104 96 - 112 mEq/L   CO2 29 19 - 32 mEq/L    Glucose, Bld 123 (H) 70 - 99 mg/dL   BUN 12 6 - 23 mg/dL   Creatinine, Ser 0.83 0.40 - 1.50 mg/dL   Calcium 8.8 8.4 - 10.5 mg/dL   GFR 95.95 >60.00 mL/min   Follow-up: Return if symptoms worsen or fail to improve.  Wilfred Lacy, NP

## 2016-10-29 NOTE — Progress Notes (Signed)
Subjective: Charles [name] is a 75 y.o. 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 He states he is doing well and is having no pain. Discontinue the cam boot when he is walking in the cam boot. He is also continue with antibiotics. He denies any systemic complaints  such as fevers, chills, nausea, vomiting. No calf pain, chest pain, shortness of breath.   Wound on the left foot discontinued he is been doing daily dressing changes as well. Denies any drainage or pus. Denies any redness or drainage or any red streaks.    Objective: General: No acute distress, AAOx3  DP/PT pulses palpable 2/4, CRT < 3 sec to all digits.  Right foot: Incision is well coapted without any evidence of dehiscence and sutures and staples are intact. There is no surrounding erythema, ascending cellulitis, fluctuance, crepitus, malodor, drainage/purulence. There is minimal edema around the surgical site. There is no pain along the surgical site.  Left foot:  Submetatarsal 1 hyperkeratotic lesion with underlying ulceration. The wound measures approximately 0.8 x 0.7 cm with a depth of 0.4cm. There does appear to be somewhat deeper in size however there is no probing to bone, undermining or tunneling. There is minimal edema to the area and there is no swelling erythema, ascending cellulitis, fluctuance, crepitus, malodor. No other areas of tenderness to bilateral lower extremities.  No other open lesions or pre-ulcerative lesions.  No pain with calf compression, swelling, warmth, erythema.   Assessment and Plan:  Status post Right TMA, doing well with no complications; Left submet 1 ulcer  -Treatment options discussed including all alternatives, risks, and complications -Incision appears to be healing well the right foot. Antibiotic ointment was applied followed by a bandage. Keep the bandage clean, dry, intact. Continue cam boot. Recommend nonweightbearing or partial weightbearing at minimum. -On the left  foot the wound was debrided the greater tissue with a tissue nipper as well as a scalpel Donna granular tissue. Small amount of serous drainage is expressed during laminate hyperkeratotic lesion as is appear to be healing over the wound. After debridement there was no further drainage or pus.  -Continue antibiotics. -Monitor for any clinical signs or symptoms of infection and DVT/PE and directed to call the office immediately should any occur or go to the ER. -Follow-up as scheduled or sooner if any problems arise. In the meantime, encouraged to call the office with any questions, concerns, change in symptoms.   Celesta Gentile, DPM

## 2016-11-01 ENCOUNTER — Ambulatory Visit (INDEPENDENT_AMBULATORY_CARE_PROVIDER_SITE_OTHER): Payer: Medicare Other | Admitting: Podiatry

## 2016-11-01 ENCOUNTER — Encounter: Payer: Self-pay | Admitting: Podiatry

## 2016-11-01 ENCOUNTER — Other Ambulatory Visit: Payer: Self-pay | Admitting: Podiatry

## 2016-11-01 DIAGNOSIS — Z89431 Acquired absence of right foot: Secondary | ICD-10-CM

## 2016-11-01 DIAGNOSIS — L02619 Cutaneous abscess of unspecified foot: Secondary | ICD-10-CM

## 2016-11-01 DIAGNOSIS — L97529 Non-pressure chronic ulcer of other part of left foot with unspecified severity: Secondary | ICD-10-CM

## 2016-11-01 DIAGNOSIS — E11621 Type 2 diabetes mellitus with foot ulcer: Secondary | ICD-10-CM | POA: Diagnosis not present

## 2016-11-01 DIAGNOSIS — L03119 Cellulitis of unspecified part of limb: Secondary | ICD-10-CM

## 2016-11-02 NOTE — Progress Notes (Signed)
Subjective: Charles [name] is a 75 y.o. 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 He states he is doing well and the surgical site on the right foot however he is having pain in the back of his foot he feels that this is because of the cam boot causing irritation. He denies any drainage or pus coming from the foot he does not feel that is infected and is coming from the cam boot. He has continue antibiotics as well.  Also presents today for follow-up of the wound to left foot second metatarsal 1. He's been continuing daily dressing changes with collagen/Silver. Denies any increase in swelling or redness. He gets mild amount of drainage from the wound.  Denies any systemic complaints such as fevers, chills, nausea, vomiting. No Pain, chest pain, short stress.  Objective: General: No acute distress, AAOx3  DP/PT pulses palpable 2/4, CRT < 3 sec to all digits.  Right foot: Incision is well coapted without any evidence of dehiscence and sutures and staples are intact. There continues to be motion across the incision site. There is no surrounding erythema, ascending cellulitis, fluctuance, crepitus, malodor, drainage/purulence. There is minimal edema around the surgical site. There is no pain along the surgical site.  Left foot:  Submetatarsal 1 hyperkeratotic lesion with underlying ulceration. The wound measures approximately 0.6 x 0.7 cm with a depth of 0.3cm. There does appear to be somewhat deeper in size however there is no probing to bone, undermining or tunneling. There was hyperkeratotic tissue overlying the wound today. Upon debrided with there was a small modest superficial purulence expressed. This was sent for culture. There is minimal edema to the area and there is no swelling erythema, ascending cellulitis, fluctuance, crepitus, malodor. No other areas of tenderness to bilateral lower extremities.  No other open lesions or pre-ulcerative lesions.  No pain with calf  compression, swelling, warmth, erythema.   Assessment and Plan:  Status post Right TMA, doing well with no complications; Left submet 1 ulcer with superficial abscess.  -Treatment options discussed including all alternatives, risks, and complications -On the right foot sutures/staples remain intact. Antibiotic ointment was applied followed by a bandage. New cam boot was dispensed that was fitting more appropriately for him. Continue antibiotics as well. This was refilled today. Pain medication as needed. Elevation. Limit weightbearing. -For the left foot the wound was sharply debrided today with a scalpel. Small amount of superficial purulence expressed and this was sent for wound culture. The wound overall appears to be improving after evaluation status post debridement. Continue with daily dressing changes as discussed. Continue offloading pads. -Monitor for any signs or symptoms of worsening infection go to the ER immediately should any occur. -Follow-up as scheduled or sooner if any problems arise. In the meantime, encouraged to call the office with any questions, concerns, change in symptoms.   Celesta Gentile, DPM

## 2016-11-06 LAB — WOUND CULTURE

## 2016-11-08 ENCOUNTER — Ambulatory Visit (INDEPENDENT_AMBULATORY_CARE_PROVIDER_SITE_OTHER): Payer: Medicare Other | Admitting: Podiatry

## 2016-11-08 ENCOUNTER — Encounter: Payer: Self-pay | Admitting: Podiatry

## 2016-11-08 DIAGNOSIS — L97522 Non-pressure chronic ulcer of other part of left foot with fat layer exposed: Secondary | ICD-10-CM

## 2016-11-08 DIAGNOSIS — Z89431 Acquired absence of right foot: Secondary | ICD-10-CM

## 2016-11-08 DIAGNOSIS — I200 Unstable angina: Secondary | ICD-10-CM

## 2016-11-08 MED ORDER — CLINDAMYCIN HCL 300 MG PO CAPS: 300.0000 mg | ORAL_CAPSULE | Freq: Three times a day (TID) | ORAL | 2 refills | Status: DC

## 2016-11-08 MED ORDER — CIPROFLOXACIN HCL 500 MG PO TABS: 500.0000 mg | ORAL_TABLET | Freq: Two times a day (BID) | ORAL | 1 refills | Status: DC

## 2016-11-15 DIAGNOSIS — H25812 Combined forms of age-related cataract, left eye: Secondary | ICD-10-CM | POA: Diagnosis not present

## 2016-11-15 DIAGNOSIS — H2512 Age-related nuclear cataract, left eye: Secondary | ICD-10-CM | POA: Diagnosis not present

## 2016-11-15 DIAGNOSIS — H25012 Cortical age-related cataract, left eye: Secondary | ICD-10-CM | POA: Diagnosis not present

## 2016-11-16 NOTE — Progress Notes (Signed)
Subjective: Charles [name] is a 75 y.o. 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 He's had some bloody drainage coming from the incision site denies any pus. Denies any redness or warmth or increase in swelling to his foot. He presents today as wife she states is looking "good". He is continuing the short cam boot but he does walk in the boot. He has unable to be nonweightbearing also has a wound of the left foot which is been actively treated for. He has continue with the collagen silver dressing changes to left foot daily. Denies any drainage or pus returned redness or red streaks. Denies any systemic complaints such as fevers, chills, nausea, vomiting. No Pain, chest pain, short stress.  Objective: General: No acute distress, AAOx3  DP/PT pulses palpable 2/4, CRT < 3 sec to all digits.  Right foot: Incision is well coapted without any evidence of dehiscence and sutures and staples are intact. There continues to be motion across the incision site although it is healing. There is areas of scabs along the incision is well. Unable to express any drainage or pus today. There is trace edema to the surgical site and there is no erythema or increase in warmth. There is no fluctuance or crepitus there is no malodor. Submetatarsal 1 hyperkeratotic lesion with underlying ulceration. The wound measures approximately 0.6 x 0.5 cm with a depth of 0.2cm. there is no undermining or tunneling. There is no probing to bone.  Small amount of serosanguineous drainage is expressed there is no pus. There is no swelling erythema, ascending cellulitis, fluctuance, crepitus, malodor.  No other open lesions or pre-ulcerative lesions.  No pain with calf compression, swelling, warmth, erythema.   Assessment and Plan:  Status post Right TMA; Left submet 1 ulcer with resolving superficial abscess.  -Treatment options discussed including all alternatives, risks, and complications -On the right foot  sutures/staples remain intact. Antibiotic ointment was applied followed by a continue cam boot.ntinue antibiotics as well. This was refilled today. Pain medication as needed. although having no pain today. Elevation. Limit weightbearing. -For the left foot the wound was sharply debrided today with a scalpel. wound appears to have some minimal evidence of healing today. Continue with offloading as well as daily dressing changes with collagen/silver dressing. -Monitor for any signs or symptoms of infection go the ER should any occur. -Follow-up as scheduled or sooner if needed.  Celesta Gentile, DPM

## 2016-11-18 ENCOUNTER — Ambulatory Visit (INDEPENDENT_AMBULATORY_CARE_PROVIDER_SITE_OTHER): Payer: Medicare Other | Admitting: Podiatry

## 2016-11-18 ENCOUNTER — Encounter: Payer: Self-pay | Admitting: Podiatry

## 2016-11-18 DIAGNOSIS — M79676 Pain in unspecified toe(s): Secondary | ICD-10-CM

## 2016-11-19 ENCOUNTER — Encounter: Payer: Self-pay | Admitting: Podiatry

## 2016-11-19 ENCOUNTER — Ambulatory Visit (INDEPENDENT_AMBULATORY_CARE_PROVIDER_SITE_OTHER): Payer: Medicare Other | Admitting: Podiatry

## 2016-11-19 VITALS — BP 122/63 | HR 62 | Temp 97.2°F | Resp 16

## 2016-11-19 DIAGNOSIS — L97522 Non-pressure chronic ulcer of other part of left foot with fat layer exposed: Secondary | ICD-10-CM

## 2016-11-19 DIAGNOSIS — Z89431 Acquired absence of right foot: Secondary | ICD-10-CM

## 2016-11-19 DIAGNOSIS — I200 Unstable angina: Secondary | ICD-10-CM

## 2016-11-21 NOTE — Progress Notes (Signed)
Cardiology Office Note   Date:  11/22/2016   ID:  JAIZEN ROBIN, DOB 1941-04-19, MRN [id number]  PCP:  [name], MD  Cardiologist:   Dorris Carnes, MD    F/U of CAD     History of Present Illness: Charles [name] is a 75 y.o. male with a history of  CAD 9s/p MI in 2000), HTN, CVA, HL and afib  He was admitted with [place] earlier this fall  Cath showed high grade severe 2 vessel obstructive disease with severe diffuse distal LAD disease (chronic and unchanged) as well as severe proximal RCA stenosis (This has progressed significantly from 2013 and appears to be the culprit lesion. It is a very complex lesion with a severe Shepherd's crook deformity, heavy calcification and filling defect consistent with thrombus.) LVEF 55-65%.  He was brought back to the lab 08/19/16 but had unsuccessful PCI of the proximal RCA due to inability to cross with stent or adequate sized balloon. 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 It was felt that if the patient had refractory limiting angina would need to consider for CABG  He was diuresed and treated medically   I saw the pt in September 2017 I recomm taking an extra lasix as needed   Since seen he denies signif CP  Breathing is OK Does not take lasix every day if he has to be out and about    Since seen he had partial amputation of foot  Outpatient Medications Prior to Visit  Medication Sig Dispense Refill  . aspirin EC 81 MG tablet Take 81 mg by mouth daily.    . Cholecalciferol (EQL VITAMIN D3) 1000 UNITS tablet Take 1,000 Units by mouth daily.      . ciprofloxacin (CIPRO) 500 MG tablet Take 1 tablet (500 mg total) by mouth 2 (two) times daily. 20 tablet 1  . clindamycin (CLEOCIN) 300 MG capsule Take 1 capsule (300 mg total) by mouth 3 (three) times daily. 30 capsule 2  . clopidogrel (PLAVIX) 75 MG tablet Take 1 tablet (75 mg total) by mouth daily. 90 tablet 3  .  Fluticasone-Salmeterol (ADVAIR DISKUS) 100-50 MCG/DOSE AEPB Inhale 1 puff into the lungs 2 (two) times daily. (Patient taking differently: Inhale 1 puff into the lungs 2 (two) times daily as needed (for shortness of breath). ) 3 each 3  . furosemide (LASIX) 80 MG tablet Take 1/2 tablet daily alternating with whole tablet daily (Patient taking differently: Take 40-80 mg by mouth See admin instructions. Take 1/2 tablet daily alternating with whole tablet daily) 90 tablet 3  . gabapentin (NEURONTIN) 300 MG capsule TAKE 1 CAPSULE THREE TIMES DAILY AS NEEDED FOR NERVE PAIN (Patient taking differently: Take 300 mg by mouth 2 (two) times daily. ) 270 capsule 1  . glimepiride (AMARYL) 4 MG tablet Take 1 tablet (4 mg total) by mouth 2 (two) times daily after a meal. 60 tablet 3  . insulin aspart (NOVOLOG FLEXPEN) 100 UNIT/ML FlexPen Use 5-20 units four times a day after meals and at bedtime Per sliding scale 100-120 cbg (Patient taking differently: Inject 3-12 Units into the skin See admin instructions. Use 3-12 units four times a day after meals and at bedtime as needed per sliding scale 100-120 cbg) 75 mL 3  . isosorbide mononitrate (IMDUR) 30 MG 24 hr tablet Take 1 tablet (30 mg total) by mouth daily. 30 tablet 6  . losartan (COZAAR) 100 MG  tablet Take 1 tablet (100 mg total) by mouth daily. 30 tablet 3  . metoprolol (LOPRESSOR) 25 MG tablet Take 1 tablet (25 mg total) by mouth 2 (two) times daily. 60 tablet 3  . nitroGLYCERIN (NITROSTAT) 0.4 MG SL tablet Place 1 tablet (0.4 mg total) under the tongue every 5 (five) minutes as needed (up to 3 doses). For chest pain 25 tablet 3  . pioglitazone (ACTOS) 45 MG tablet Take 1 tablet (45 mg total) by mouth daily. 90 tablet 3  . potassium chloride SA (K-DUR,KLOR-CON) 20 MEQ tablet Take 1 tablet (20 mEq total) by mouth daily. 30 tablet 3  . pravastatin (PRAVACHOL) 40 MG tablet Take 1 tablet (40 mg total) by mouth daily. 90 tablet 3  . triamcinolone cream (KENALOG) 0.5  % Apply 1 application topically 2 (two) times daily as needed (rash). 30 g 3  . vitamin B-12 (CYANOCOBALAMIN) 1000 MCG tablet Take 1,000 mcg by mouth daily.      . promethazine (PHENERGAN) 25 MG tablet Take 1 tablet (25 mg total) by mouth every 8 (eight) hours as needed for nausea or vomiting. 30 tablet 0  . oxyCODONE-acetaminophen (PERCOCET/ROXICET) 5-325 MG tablet Take 1-2 tablets by mouth every 6 (six) hours as needed for severe pain. (Patient not taking: Reported on 11/22/2016) 30 tablet 0   No facility-administered medications prior to visit.      Allergies:   Xarelto [rivaroxaban]   Past Medical History:  Diagnosis Date  . Arthritis   . B12 deficiency   . CAD (coronary artery disease)    a. Approx. 2000 - MI. Cath: single vsl dz, PTCA dLAD/medical rx. ;  b. NSTEMI 11/13 => IVUS attempted for RCA but not successful; anatomy felt stable from 2000 => med Rx. c. [place] 08/2016: severe stable diffuse dLAD, severe prox RCA -> PCI of RCA unsuccessful, consider CABG for refractory angina.  . Cancer ([place])    skin cancer- head   . Cataracts   . Cholelithiasis   . Chronic diastolic CHF (congestive heart failure) ([place])   . Claustrophobia   . CVA (cerebral vascular accident) ([place]) ~ 2001   vision inparted from stroke.  Visual Memory loss  . Diabetic neuropathy ([place])   . Diverticulitis   . Dysrhythmia    if he does not take Metoprolol- Afib  . ED (erectile dysfunction)   . Essential hypertension   . History of MRSA infection    Right foot  . History of stomach ulcers   . HOH (hard of hearing)   . Hypercholesterolemia   . Legally blind   . Lower extremity edema   . Morbid obesity ([place])   . Neuropathy ([place])   . OSA on CPAP   . PAF (paroxysmal atrial fibrillation) ([name])    a. confirmed by event monitor. b. 02/2014 rash on Coumadin, patient decided to discontinue Xarelto due to possible rash, cost and lawyers ads on TV, agreed to take Plavix.  . Pneumonia 2016  . Tunnel vision    Since  stroke  . Type II diabetes mellitus ([place])   . Venous stasis     Past Surgical History:  Procedure Laterality Date  . CARDIAC CATHETERIZATION  1990's  . CARDIAC CATHETERIZATION N/A 08/17/2016   Procedure: Left Heart Cath and Coronary Angiography;  Surgeon: Peter M Martinique, MD;  Location: [facility] CV [facility];  Service: Cardiovascular;  Laterality: N/A;  . CARDIAC CATHETERIZATION N/A 08/19/2016   Procedure: Coronary Balloon Angioplasty;  Surgeon: Peter M Martinique, MD;  Location:  [facility] IN[facility]AS[facility] CV LAB;  Service: Cardiovascular;  Laterality: N/A;  . CEREBRAL ANGIOGRAM  ~ 2000  . COLONOSCOPY    . LEFT HEART CATHETERIZATION WITH CORONARY ANGIOGRAM N/A 10/27/2012   Procedure: LEFT HEART CATHETERIZATION WITH CORONARY ANGIOGRAM;  Surgeon: Burnell Blanks, MD;  Location: [facility] CATH LAB;  Service: Cardiovascular;  Laterality: N/A;  . TOE AMPUTATION  2006; 2009   "Dr. Blenda Mounts; big toe left foot; little toe on right foot" (10/26/2012)  . TOE AMPUTATION Right 12/24/2015   2nd & 3 toes/notes 1/13//2017  . TOE AMPUTATION Right    5TH TOE  . 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 His smoking use included Cigarettes and Cigars. He has a 30.00 pack-year smoking history. He quit smokeless tobacco use about 3 months ago. He reports that he drinks alcohol. He reports that he does not use drugs.   Family History:  The patient's family history includes Breast cancer (age of onset: 74) in his mother; Heart disease (age of onset: 3) in his father.    ROS:  Please see the history of present illness. All other systems are reviewed and  Negative to the above problem except as noted.    PHYSICAL EXAM: VS:  BP (!) 118/58   Pulse 62   Ht 6\' 4"  (1.93 m)   Wt (!) 364 lb 6.4 oz (165.3 kg)   SpO2 96%   BMI 44.36 kg/m    GEN: Morbidly obese 75 yo , in no acute distress HEENT: normal Neck: no JVD, carotid bruits, or masses Cardiac: RRR; no murmurs, rubs, or gallops,  Tr to 1+d edema  Respiratory:  clear to auscultation bilaterally, normal work of breathing GI: soft, nontender, nondistended, + BS  No hepatomegaly  MS: no deformity Moving all extremities   Skin: warm and dry, no rash Neuro:  Strength and sensation are intact Psych: euthymic mood, full affect   EKG:  EKG is not  ordered today.   Lipid Panel    Component Value Date/Time   CHOL 98 08/16/2016 0448   TRIG 49 08/16/2016 0448   TRIG 73 12/22/2006 0827   HDL 33 (L) 08/16/2016 0448   CHOLHDL 3.0 08/16/2016 0448   VLDL 10 08/16/2016 0448   LDLCALC 55 08/16/2016 0448      Wt Readings from Last 3 Encounters:  11/22/16 (!) 364 lb 6.4 oz (165.3 kg)  10/26/16 (!) 371 lb (168.3 kg)  10/18/16 (!) 368 lb (166.9 kg)      ASSESSMENT AND PLAN:  1  CAD  Pt denies CP     2  Chronic systolic CHF  VOlume is up some  Wt up  He says that he has missed some lasix  Does not take on days he has appts, has to get out of house  Watch salt  Take lasxi    3  Atrial fib  PAF  Patient has refused anticoag  Continue ASA and Plavix     Current medicines are reviewed at length with the patient today.  The patient does not have concerns regarding medicines.  Signed, Dorris Carnes, MD  11/22/2016 4:18 PM    [facility] [address], [place], [place]  [address] Phone: [phone]; Fax: [phone]

## 2016-11-22 ENCOUNTER — Ambulatory Visit (INDEPENDENT_AMBULATORY_CARE_PROVIDER_SITE_OTHER): Payer: Medicare Other | Admitting: Internal Medicine

## 2016-11-22 ENCOUNTER — Encounter: Payer: Self-pay | Admitting: Internal Medicine

## 2016-11-22 ENCOUNTER — Ambulatory Visit: Payer: Medicare Other | Admitting: Internal Medicine

## 2016-11-22 VITALS — BP 118/58 | HR 62 | Ht 76.0 in | Wt 364.4 lb

## 2016-11-22 DIAGNOSIS — I25810 Atherosclerosis of coronary artery bypass graft(s) without angina pectoris: Secondary | ICD-10-CM

## 2016-11-22 DIAGNOSIS — I480 Paroxysmal atrial fibrillation: Secondary | ICD-10-CM | POA: Diagnosis not present

## 2016-11-22 DIAGNOSIS — I5022 Chronic systolic (congestive) heart failure: Secondary | ICD-10-CM

## 2016-11-22 DIAGNOSIS — I200 Unstable angina: Secondary | ICD-10-CM | POA: Diagnosis not present

## 2016-11-22 NOTE — Patient Instructions (Signed)
Your physician recommends that you continue on your current medications as directed. Please refer to the Current Medication list given to you today.     

## 2016-11-26 ENCOUNTER — Encounter: Payer: Self-pay | Admitting: Podiatry

## 2016-11-26 ENCOUNTER — Ambulatory Visit (INDEPENDENT_AMBULATORY_CARE_PROVIDER_SITE_OTHER): Payer: Medicare Other | Admitting: Podiatry

## 2016-11-26 DIAGNOSIS — T8130XA Disruption of wound, unspecified, initial encounter: Secondary | ICD-10-CM

## 2016-11-26 DIAGNOSIS — Z89431 Acquired absence of right foot: Secondary | ICD-10-CM

## 2016-11-26 DIAGNOSIS — L97522 Non-pressure chronic ulcer of other part of left foot with fat layer exposed: Secondary | ICD-10-CM

## 2016-11-26 MED ORDER — CLINDAMYCIN HCL 300 MG PO CAPS: 300.0000 mg | ORAL_CAPSULE | Freq: Three times a day (TID) | ORAL | 2 refills | Status: DC

## 2016-11-26 MED ORDER — CIPROFLOXACIN HCL 500 MG PO TABS: 500.0000 mg | ORAL_TABLET | Freq: Two times a day (BID) | ORAL | 1 refills | Status: DC

## 2016-11-28 NOTE — Progress Notes (Signed)
Patient rescheduled

## 2016-11-28 NOTE — Progress Notes (Signed)
Subjective: Charles [name] is a 75 y.o. 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 He continued to the small amount of bloody/yellow drainage to the wound and the patient's wife states that she did have to reinforce the bandage which she has not changed it. He has continue with clindamycin and ciprofloxacin. He still gets some pain in the foot but he states this is only when he wears the boot he does a lot of standing or walking. He has no pain at rest. He also has a wound to left foot for which they have been using silver dressing changes daily. Denies any drainage or pus any redness or warmth of foot. Denies any systemic complaints such as fevers, chills, nausea, vomiting. No Pain, chest pain, short stress.  Objective: General: No acute distress, AAOx3  DP/PT pulses palpable 2/4, CRT < 3 sec to all digits.  Right foot: Incision to the right foot with some granular superficial wound along the incision some mild fibrotic tissue. Sutures/staples are intact. There is mild erythema around the incision there is no warmth or ascending cellulitis. There is no drainage or pus expressed today except for small amount of bloody drainage is identified in the bandage. There is no malodor to the area. He states it is not hurting it does not feel like his infected like he is felt before. Left foot: 7 metatarsal 1 hyperkeratotic lesion with central ulceration. Upon debridement the wound is very superficial and is improving and measure 0.1 x 0.1 synovators in almost a pinpoint opening. There is no probing, undermining or tunneling. There is no swelling erythema, ascending cellulitis, fluctuance, carpus, malodor. No other open lesions or pre-ulcer lesions identified this time. No pain with calf compression, swelling, warmth, erythema.   Assessment and Plan:  Status post Right TMA; Left submet 1 ulcer which is improving.   -Treatment options discussed including all alternatives, risks, and  complications -The left foot the wound is debrided today with a scalpel to Green her tissue. [place] continue with  collagen/silver dressing changes daily. Continue offloading.  -On the right foot the staples were removed. The sutures remain intact., Fibrotic/granular wound is present. Recommend continue with daily dressing changes. For now she can use Silvadene to the area daily. Mild erythema likely from inflammation there is no overt signs of infection. Continue cam boot to the right foot. -Monitor for any signs or symptoms of worsening infection to the ER medially is any occur. -Follow-up in 1 week or sooner if needed.  Celesta Gentile, DPM

## 2016-11-28 NOTE — Progress Notes (Addendum)
Subjective: Charles [name] is a 74 y.o. 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 He states the dressings being changed every day to the right foot as well as left foot. He still denies any pain to the foot except for when he is wearing the cam boot. Unfortunately his had multiple doctors appointments at the last week and as he is getting up and down he has pain to the foot. Still denies any pain at rest. He does not feel that the wound is infected to either foot as he states that it feels different typically when it is infected. Denies any drainage or pus of the left foot denies any increase in swelling or warmth.Denies any systemic complaints such as fevers, chills, nausea, vomiting. No Pain, chest pain, short stress.  Objective: General: No acute distress, AAOx3  DP/PT pulses palpable 2/4, CRT < 3 sec to all digits.  Right foot: Incision with sutures intact. Hyperkeratotic tissue around the incision. There is increasingly fibrotic as well as a small amount of necrotic tissue to the incision site. There is pockets of granular tissue as well. Upon expression unable to identify any drainage or pus coming from the area today. There is mild amount of serosanguineous drainage in the bandage. There is no malodor. Still faint amount of erythema around the incision but there is no ascending cellulitis, fluctuation, crepitus, malodor. There is no increase in warmth of the foot. Wound is approximately 6 x 0.4 x 0.1 cm Left foot submetatarsal one superficial granular wound. The area is about the same size from last week however it is looking somewhat more superficial and healing well. There is no drainage or pus or erythema or signs of infection. No other open lesions or pre-ulcer lesions identified this time. No pain with calf compression, swelling, warmth, erythema.   Assessment and Plan:  Status post Right TMA; Left submet 1 ulcer which is improving.   -Treatment options discussed including  all alternatives, risks, and complications -Sutures removed today to the right foot. There is increasing amount of fibrotic as well as a small amount of necrotic tissue on the incision. We will switch to Santyl dressing changes daily. I discussed with him and his son who accompanied him today how to apply the medication. We will order the [facility] for him in to have it mailed to the house. Continue Silvadene for now. -There is hyperkeratotic tissue around the incision on the right foot. This was debrided today.  Continue cam boot. -Continue offloading to left foot and this wound appears to be healing well there is no signs of infection. -Ordered compound cream for neuropathy through [facility] for neuropathy. He has been getting sharp pains to his feet (even prior to surgery) and on gabapentin. Hopefully this will help as he can use it prn.  -Monitor for any signs or symptoms of worsening infection to the ER medially is any occur. -Follow-up in 10 days or sooner if needed.  Celesta Gentile, DPM

## 2016-11-29 ENCOUNTER — Telehealth: Payer: Self-pay | Admitting: *Deleted

## 2016-11-29 MED ORDER — NONFORMULARY OR COMPOUNDED ITEM: 2 refills | Status: DC

## 2016-11-29 MED ORDER — COLLAGENASE 250 UNIT/GM EX OINT: 1.0000 "application " | TOPICAL_OINTMENT | Freq: Every day | CUTANEOUS | 3 refills | Status: DC

## 2016-11-29 NOTE — Telephone Encounter (Addendum)
-----   Message from Trula Slade, DPM sent at 11/28/2016  2:55 PM EST ----- Can you please order santyl for him? Thanks- If you give me the paper I can help with it. 11/29/2016-Faxed required form, clinicals and demographics to [facility] for right foot.

## 2016-11-29 NOTE — Addendum Note (Signed)
Addended by: Harriett Sine D on: 11/29/2016 02:41 PM   Modules accepted: Orders

## 2016-12-01 ENCOUNTER — Ambulatory Visit: Payer: Medicare Other | Admitting: Internal Medicine

## 2016-12-10 ENCOUNTER — Ambulatory Visit (INDEPENDENT_AMBULATORY_CARE_PROVIDER_SITE_OTHER): Payer: Medicare Other | Admitting: Podiatry

## 2016-12-10 ENCOUNTER — Ambulatory Visit (INDEPENDENT_AMBULATORY_CARE_PROVIDER_SITE_OTHER): Payer: Medicare Other

## 2016-12-10 ENCOUNTER — Encounter: Payer: Self-pay | Admitting: Podiatry

## 2016-12-10 VITALS — Temp 96.6°F

## 2016-12-10 DIAGNOSIS — M86671 Other chronic osteomyelitis, right ankle and foot: Secondary | ICD-10-CM

## 2016-12-10 DIAGNOSIS — T8130XA Disruption of wound, unspecified, initial encounter: Secondary | ICD-10-CM | POA: Diagnosis not present

## 2016-12-10 DIAGNOSIS — Z899 Acquired absence of limb, unspecified: Secondary | ICD-10-CM

## 2016-12-10 DIAGNOSIS — Z9889 Other specified postprocedural states: Secondary | ICD-10-CM

## 2016-12-10 NOTE — Progress Notes (Signed)
Subjective: Charles [name] is a 75 y.o. 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 He was having some pain of the right foot last week so therefore his wife did not start the Santyl dressing changes. The pain didn't resolve he is calling unexpected to any pain. He states that as he is off his feet and rested more he had decreased pain. Does sound like he is on his feet quite a bit for the holidays cooking and doing other daily activities. His wife has not noticed any pop coming from the incision on the right foot. She says of the left side is doing very well. Denies any systemic complaints such as fevers, chills, nausea, vomiting. No Pain, chest pain, short stress.  Objective: General: No acute distress, AAOx3  DP/PT pulses palpable 2/4, CRT < 3 sec to all digits.  Right foot: Incision with sutures intact. Hyperkeratotic tissue around the incision.  Upon debridement there is movement across the incision however there is no definitive opening. The wound appears to be in the superficial layers skin. Small amount of serous single restrains expressed there is no pus. There is faint erythema around the incision but this appears be more from inflammation as opposed infection. There is no increase in warmth to the foot there is no ascending cellulitis. The wound measures the entire length of the TMA incision.  On the left foot ulceration submetatarsal 1 appears be healed. No edema, erythema, drainage or pus. There is no malodor. There are no other open lesions or pre-ulcer lesions identified. No pain with calf compression, swelling, warmth, erythema.   Assessment and Plan:  Status post Right TMA with superficial dehiscence/ulceration of the incision site; Left submet 1 ulcer which is healed  -Treatment options discussed including all alternatives, risks, and complications -Incision site/ulceration was debrided today without complications. There is fibrotic/granular wound base present.  Recommend continue/start Santyl dressing changes daily. This was applied today. Continue with the cam boot for now. This the course of the antibiotics. Once he is finished we will hold off on further antibiotic we will hold off on further antibiotics. -X-rays were obtained and reviewed today. There is no definitive cortical changes suggestive osteomy let us there is no soft tissue emphysema present. -Left foot appears be healed. Continue monitor for any further breakdown. -Monitor for any signs or symptoms of worsening infection to the ER should any occur. -Follow-up in 2 weeks or sooner if needed. Call any questions concerns meantime.  Celesta Gentile, DPM  -Sutures removed today to the right foot. There is increasing amount of fibrotic as well as a small amount of necrotic tissue on the incision. We will switch to Santyl dressing changes daily. I discussed with him and his son who accompanied him today how to apply the medication. We will order the [facility] for him in to have it mailed to the house. Continue Silvadene for now. -There is hyperkeratotic tissue around the incision on the right foot. This was debrided today.  Continue cam boot. -Continue offloading to left foot and this wound appears to be healing well there is no signs of infection. -Ordered compound cream for neuropathy through [facility] for neuropathy. He has been getting sharp pains to his feet (even prior to surgery) and on gabapentin. Hopefully this will help as he can use it prn.  -Monitor for any signs or symptoms of worsening infection to the ER medially is any occur. -Follow-up in 10 days or sooner if needed.  Rodman Key  Jacqualyn Posey, DPM

## 2016-12-15 ENCOUNTER — Encounter (HOSPITAL_COMMUNITY): Payer: Self-pay | Admitting: Podiatry

## 2016-12-27 ENCOUNTER — Ambulatory Visit: Payer: Medicare Other | Admitting: Podiatry

## 2016-12-28 ENCOUNTER — Ambulatory Visit: Payer: Medicare Other | Admitting: Internal Medicine

## 2016-12-31 ENCOUNTER — Ambulatory Visit: Payer: Medicare Other | Admitting: Internal Medicine

## 2017-01-03 ENCOUNTER — Other Ambulatory Visit: Payer: Self-pay | Admitting: Physician Assistant

## 2017-01-06 ENCOUNTER — Ambulatory Visit (INDEPENDENT_AMBULATORY_CARE_PROVIDER_SITE_OTHER): Payer: Medicare Other | Admitting: Podiatry

## 2017-01-06 ENCOUNTER — Encounter: Payer: Self-pay | Admitting: Podiatry

## 2017-01-06 VITALS — Temp 98.8°F

## 2017-01-06 DIAGNOSIS — T8130XA Disruption of wound, unspecified, initial encounter: Secondary | ICD-10-CM

## 2017-01-10 ENCOUNTER — Telehealth: Payer: Self-pay | Admitting: *Deleted

## 2017-01-10 ENCOUNTER — Other Ambulatory Visit: Payer: Self-pay | Admitting: Internal Medicine

## 2017-01-10 ENCOUNTER — Encounter (HOSPITAL_COMMUNITY): Payer: Self-pay

## 2017-01-10 ENCOUNTER — Inpatient Hospital Stay (HOSPITAL_COMMUNITY)
Admission: EM | Admit: 2017-01-10 | Discharge: 2017-01-13 | DRG: 638 | Disposition: A | Discharge status: Skilled Nursing Facility | Payer: Medicare Other | Attending: Internal Medicine | Admitting: Internal Medicine

## 2017-01-10 ENCOUNTER — Emergency Department (HOSPITAL_COMMUNITY): Payer: Medicare Other

## 2017-01-10 DIAGNOSIS — Z79899 Other long term (current) drug therapy: Secondary | ICD-10-CM

## 2017-01-10 DIAGNOSIS — E11649 Type 2 diabetes mellitus with hypoglycemia without coma: Principal | ICD-10-CM | POA: Diagnosis present

## 2017-01-10 DIAGNOSIS — E113399 Type 2 diabetes mellitus with moderate nonproliferative diabetic retinopathy without macular edema, unspecified eye: Secondary | ICD-10-CM

## 2017-01-10 DIAGNOSIS — Z8673 Personal history of transient ischemic attack (TIA), and cerebral infarction without residual deficits: Secondary | ICD-10-CM

## 2017-01-10 DIAGNOSIS — I248 Other forms of acute ischemic heart disease: Secondary | ICD-10-CM | POA: Diagnosis present

## 2017-01-10 DIAGNOSIS — E139 Other specified diabetes mellitus without complications: Secondary | ICD-10-CM | POA: Diagnosis not present

## 2017-01-10 DIAGNOSIS — E861 Hypovolemia: Secondary | ICD-10-CM | POA: Diagnosis present

## 2017-01-10 DIAGNOSIS — E871 Hypo-osmolality and hyponatremia: Secondary | ICD-10-CM | POA: Diagnosis present

## 2017-01-10 DIAGNOSIS — Z87891 Personal history of nicotine dependence: Secondary | ICD-10-CM

## 2017-01-10 DIAGNOSIS — R630 Anorexia: Secondary | ICD-10-CM | POA: Diagnosis present

## 2017-01-10 DIAGNOSIS — Z8249 Family history of ischemic heart disease and other diseases of the circulatory system: Secondary | ICD-10-CM

## 2017-01-10 DIAGNOSIS — H548 Legal blindness, as defined in USA: Secondary | ICD-10-CM | POA: Diagnosis present

## 2017-01-10 DIAGNOSIS — Z8614 Personal history of Methicillin resistant Staphylococcus aureus infection: Secondary | ICD-10-CM

## 2017-01-10 DIAGNOSIS — R7989 Other specified abnormal findings of blood chemistry: Secondary | ICD-10-CM | POA: Diagnosis present

## 2017-01-10 DIAGNOSIS — I959 Hypotension, unspecified: Secondary | ICD-10-CM | POA: Diagnosis present

## 2017-01-10 DIAGNOSIS — Z6841 Body Mass Index (BMI) 40.0 and over, adult: Secondary | ICD-10-CM

## 2017-01-10 DIAGNOSIS — H9190 Unspecified hearing loss, unspecified ear: Secondary | ICD-10-CM | POA: Diagnosis present

## 2017-01-10 DIAGNOSIS — D649 Anemia, unspecified: Secondary | ICD-10-CM | POA: Diagnosis present

## 2017-01-10 DIAGNOSIS — Z794 Long term (current) use of insulin: Secondary | ICD-10-CM

## 2017-01-10 DIAGNOSIS — G4733 Obstructive sleep apnea (adult) (pediatric): Secondary | ICD-10-CM | POA: Diagnosis present

## 2017-01-10 DIAGNOSIS — R778 Other specified abnormalities of plasma proteins: Secondary | ICD-10-CM | POA: Diagnosis present

## 2017-01-10 DIAGNOSIS — Z89429 Acquired absence of other toe(s), unspecified side: Secondary | ICD-10-CM

## 2017-01-10 DIAGNOSIS — I5032 Chronic diastolic (congestive) heart failure: Secondary | ICD-10-CM | POA: Diagnosis present

## 2017-01-10 DIAGNOSIS — Z9989 Dependence on other enabling machines and devices: Secondary | ICD-10-CM

## 2017-01-10 DIAGNOSIS — J9811 Atelectasis: Secondary | ICD-10-CM | POA: Diagnosis not present

## 2017-01-10 DIAGNOSIS — Z7951 Long term (current) use of inhaled steroids: Secondary | ICD-10-CM

## 2017-01-10 DIAGNOSIS — I2510 Atherosclerotic heart disease of native coronary artery without angina pectoris: Secondary | ICD-10-CM | POA: Diagnosis not present

## 2017-01-10 DIAGNOSIS — R404 Transient alteration of awareness: Secondary | ICD-10-CM | POA: Diagnosis not present

## 2017-01-10 DIAGNOSIS — I471 Supraventricular tachycardia: Secondary | ICD-10-CM | POA: Diagnosis present

## 2017-01-10 DIAGNOSIS — R197 Diarrhea, unspecified: Secondary | ICD-10-CM | POA: Diagnosis present

## 2017-01-10 DIAGNOSIS — T383X5A Adverse effect of insulin and oral hypoglycemic [antidiabetic] drugs, initial encounter: Secondary | ICD-10-CM | POA: Diagnosis present

## 2017-01-10 DIAGNOSIS — E785 Hyperlipidemia, unspecified: Secondary | ICD-10-CM | POA: Diagnosis present

## 2017-01-10 DIAGNOSIS — J449 Chronic obstructive pulmonary disease, unspecified: Secondary | ICD-10-CM | POA: Diagnosis present

## 2017-01-10 DIAGNOSIS — R262 Difficulty in walking, not elsewhere classified: Secondary | ICD-10-CM | POA: Diagnosis not present

## 2017-01-10 DIAGNOSIS — E162 Hypoglycemia, unspecified: Secondary | ICD-10-CM | POA: Diagnosis not present

## 2017-01-10 DIAGNOSIS — J439 Emphysema, unspecified: Secondary | ICD-10-CM | POA: Diagnosis not present

## 2017-01-10 DIAGNOSIS — A09 Infectious gastroenteritis and colitis, unspecified: Secondary | ICD-10-CM | POA: Diagnosis not present

## 2017-01-10 DIAGNOSIS — R748 Abnormal levels of other serum enzymes: Secondary | ICD-10-CM | POA: Diagnosis not present

## 2017-01-10 DIAGNOSIS — Z7982 Long term (current) use of aspirin: Secondary | ICD-10-CM

## 2017-01-10 DIAGNOSIS — A0472 Enterocolitis due to Clostridium difficile, not specified as recurrent: Secondary | ICD-10-CM | POA: Diagnosis not present

## 2017-01-10 DIAGNOSIS — I878 Other specified disorders of veins: Secondary | ICD-10-CM | POA: Diagnosis present

## 2017-01-10 DIAGNOSIS — E860 Dehydration: Secondary | ICD-10-CM | POA: Diagnosis present

## 2017-01-10 DIAGNOSIS — M1990 Unspecified osteoarthritis, unspecified site: Secondary | ICD-10-CM | POA: Diagnosis present

## 2017-01-10 DIAGNOSIS — I110 Hypertensive heart disease with heart failure: Secondary | ICD-10-CM | POA: Diagnosis present

## 2017-01-10 DIAGNOSIS — I480 Paroxysmal atrial fibrillation: Secondary | ICD-10-CM | POA: Diagnosis present

## 2017-01-10 DIAGNOSIS — IMO0002 Reserved for concepts with insufficient information to code with codable children: Secondary | ICD-10-CM | POA: Diagnosis present

## 2017-01-10 DIAGNOSIS — E1140 Type 2 diabetes mellitus with diabetic neuropathy, unspecified: Secondary | ICD-10-CM | POA: Diagnosis present

## 2017-01-10 DIAGNOSIS — E878 Other disorders of electrolyte and fluid balance, not elsewhere classified: Secondary | ICD-10-CM | POA: Diagnosis present

## 2017-01-10 DIAGNOSIS — R531 Weakness: Secondary | ICD-10-CM | POA: Diagnosis not present

## 2017-01-10 DIAGNOSIS — Z9861 Coronary angioplasty status: Secondary | ICD-10-CM

## 2017-01-10 DIAGNOSIS — I5030 Unspecified diastolic (congestive) heart failure: Secondary | ICD-10-CM | POA: Diagnosis not present

## 2017-01-10 DIAGNOSIS — D72829 Elevated white blood cell count, unspecified: Secondary | ICD-10-CM | POA: Diagnosis present

## 2017-01-10 DIAGNOSIS — R296 Repeated falls: Secondary | ICD-10-CM | POA: Diagnosis present

## 2017-01-10 DIAGNOSIS — E11319 Type 2 diabetes mellitus with unspecified diabetic retinopathy without macular edema: Secondary | ICD-10-CM | POA: Diagnosis present

## 2017-01-10 DIAGNOSIS — Z7902 Long term (current) use of antithrombotics/antiplatelets: Secondary | ICD-10-CM

## 2017-01-10 DIAGNOSIS — E1165 Type 2 diabetes mellitus with hyperglycemia: Secondary | ICD-10-CM | POA: Diagnosis not present

## 2017-01-10 DIAGNOSIS — M6281 Muscle weakness (generalized): Secondary | ICD-10-CM | POA: Diagnosis not present

## 2017-01-10 DIAGNOSIS — Z888 Allergy status to other drugs, medicaments and biological substances status: Secondary | ICD-10-CM

## 2017-01-10 DIAGNOSIS — L03115 Cellulitis of right lower limb: Secondary | ICD-10-CM | POA: Diagnosis not present

## 2017-01-10 DIAGNOSIS — I252 Old myocardial infarction: Secondary | ICD-10-CM

## 2017-01-10 DIAGNOSIS — R5381 Other malaise: Secondary | ICD-10-CM | POA: Diagnosis present

## 2017-01-10 HISTORY — DX: Weakness: R53.1

## 2017-01-10 LAB — CBC WITH DIFFERENTIAL/PLATELET
BASOS ABS: 0 10*3/uL (ref 0.0–0.1)
BASOS PCT: 0 %
Eosinophils Absolute: 0 10*3/uL (ref 0.0–0.7)
Eosinophils Relative: 0 %
HEMATOCRIT: 35.3 % — AB (ref 39.0–52.0)
HEMOGLOBIN: 11.5 g/dL — AB (ref 13.0–17.0)
LYMPHS PCT: 5 %
Lymphs Abs: 1 10*3/uL (ref 0.7–4.0)
MCH: 29.9 pg (ref 26.0–34.0)
MCHC: 32.6 g/dL (ref 30.0–36.0)
MCV: 91.9 fL (ref 78.0–100.0)
Monocytes Absolute: 1.7 10*3/uL — ABNORMAL HIGH (ref 0.1–1.0)
Monocytes Relative: 9 %
NEUTROS ABS: 15.6 10*3/uL — AB (ref 1.7–7.7)
Neutrophils Relative %: 86 %
Platelets: 228 10*3/uL (ref 150–400)
RBC: 3.84 MIL/uL — AB (ref 4.22–5.81)
RDW: 16 % — AB (ref 11.5–15.5)
WBC: 18.2 10*3/uL — AB (ref 4.0–10.5)

## 2017-01-10 LAB — CBG MONITORING, ED
GLUCOSE-CAPILLARY: 42 mg/dL — AB (ref 65–99)
GLUCOSE-CAPILLARY: 115 mg/dL — AB (ref 65–99)
GLUCOSE-CAPILLARY: 83 mg/dL (ref 65–99)
GLUCOSE-CAPILLARY: 80 mg/dL (ref 65–99)
GLUCOSE-CAPILLARY: 34 mg/dL — AB (ref 65–99)
Glucose-Capillary: 63 mg/dL — ABNORMAL LOW (ref 65–99)
Glucose-Capillary: 72 mg/dL (ref 65–99)
Glucose-Capillary: 60 mg/dL — ABNORMAL LOW (ref 65–99)

## 2017-01-10 LAB — COMPREHENSIVE METABOLIC PANEL
ALBUMIN: 2.8 g/dL — AB (ref 3.5–5.0)
ALK PHOS: 50 U/L (ref 38–126)
ALT: 13 U/L — ABNORMAL LOW (ref 17–63)
ANION GAP: 9 (ref 5–15)
AST: 36 U/L (ref 15–41)
BILIRUBIN TOTAL: 1.3 mg/dL — AB (ref 0.3–1.2)
BUN: 29 mg/dL — ABNORMAL HIGH (ref 6–20)
CALCIUM: 8.7 mg/dL — AB (ref 8.9–10.3)
CO2: 24 mmol/L (ref 22–32)
Chloride: 100 mmol/L — ABNORMAL LOW (ref 101–111)
Creatinine, Ser: 1.19 mg/dL (ref 0.61–1.24)
GFR calc non Af Amer: 58 mL/min — ABNORMAL LOW (ref >60)
GLUCOSE: 41 mg/dL — AB (ref 65–99)
POTASSIUM: 4.5 mmol/L (ref 3.5–5.1)
Sodium: 133 mmol/L — ABNORMAL LOW (ref 135–145)
TOTAL PROTEIN: 5.6 g/dL — AB (ref 6.5–8.1)

## 2017-01-10 LAB — URINALYSIS, ROUTINE W REFLEX MICROSCOPIC
BILIRUBIN URINE: NEGATIVE
Bacteria, UA: NONE SEEN
GLUCOSE, UA: NEGATIVE mg/dL
Hgb urine dipstick: NEGATIVE
Ketones, ur: NEGATIVE mg/dL
NITRITE: NEGATIVE
PH: 5 (ref 5.0–8.0)
Protein, ur: NEGATIVE mg/dL
SPECIFIC GRAVITY, URINE: 1.013 (ref 1.005–1.030)

## 2017-01-10 LAB — BASIC METABOLIC PANEL
BUN: 29 mg/dL — AB (ref 4–21)
Creatinine: 1.2 mg/dL (ref 0.6–1.3)
Glucose: 41 mg/dL
POTASSIUM: 4.5 mmol/L (ref 3.4–5.3)
SODIUM: 133 mmol/L — AB (ref 137–147)

## 2017-01-10 LAB — HEPATIC FUNCTION PANEL
ALK PHOS: 50 U/L (ref 25–125)
ALT: 13 U/L (ref 10–40)
AST: 36 U/L (ref 14–40)
Bilirubin, Total: 1.3 mg/dL

## 2017-01-10 LAB — TSH
TSH: 1.205 u[IU]/mL (ref 0.350–4.500)
TSH: 1.21 u[IU]/mL (ref 0.41–5.90)

## 2017-01-10 LAB — TROPONIN I
TROPONIN I: 0.54 ng/mL — AB (ref <0.03)
TROPONIN I: 0.39 ng/mL — AB (ref <0.03)

## 2017-01-10 LAB — CBC AND DIFFERENTIAL
HEMATOCRIT: 35 % — AB (ref 41–53)
HEMOGLOBIN: 11.5 g/dL — AB (ref 13.5–17.5)
Platelets: 228 10*3/uL (ref 150–399)
WBC: 18.2 10^3/mL

## 2017-01-10 LAB — I-STAT CG4 LACTIC ACID, ED: Lactic Acid, Venous: 1.8 mmol/L (ref 0.5–1.9)

## 2017-01-10 LAB — CORTISOL: Cortisol, Plasma: 16.1 ug/dL

## 2017-01-10 MED ORDER — DEXTROSE 50 % IV SOLN
INTRAVENOUS | Status: AC
  Filled 2017-01-10: qty 50

## 2017-01-10 MED ORDER — SODIUM CHLORIDE 0.9% FLUSH
3.0000 mL | Freq: Two times a day (BID) | INTRAVENOUS | Status: DC
  Administered 2017-01-11 – 2017-01-13 (×3): 3 mL via INTRAVENOUS

## 2017-01-10 MED ORDER — SODIUM CHLORIDE 0.9 % IV BOLUS (SEPSIS)
500.0000 mL | Freq: Once | INTRAVENOUS | Status: AC
  Administered 2017-01-10: 500 mL via INTRAVENOUS

## 2017-01-10 MED ORDER — ONDANSETRON HCL 4 MG PO TABS: 4.0000 mg | ORAL_TABLET | Freq: Four times a day (QID) | ORAL | Status: DC | PRN

## 2017-01-10 MED ORDER — CLOPIDOGREL BISULFATE 75 MG PO TABS
75.0000 mg | ORAL_TABLET | Freq: Every day | ORAL | Status: DC
  Administered 2017-01-11 – 2017-01-13 (×3): 75 mg via ORAL
  Filled 2017-01-10 (×3): qty 1

## 2017-01-10 MED ORDER — HYDROCODONE-ACETAMINOPHEN 5-325 MG PO TABS: 1.0000 | ORAL_TABLET | ORAL | Status: DC | PRN

## 2017-01-10 MED ORDER — VITAMIN B-12 1000 MCG PO TABS
1000.0000 ug | ORAL_TABLET | Freq: Every day | ORAL | Status: DC
  Administered 2017-01-11 – 2017-01-13 (×3): 1000 ug via ORAL
  Filled 2017-01-10 (×3): qty 1

## 2017-01-10 MED ORDER — VITAMIN D 1000 UNITS PO TABS
1000.0000 [IU] | ORAL_TABLET | Freq: Every day | ORAL | Status: DC
  Administered 2017-01-11 – 2017-01-13 (×3): 1000 [IU] via ORAL
  Filled 2017-01-10 (×3): qty 1

## 2017-01-10 MED ORDER — ACETAMINOPHEN 325 MG PO TABS
650.0000 mg | ORAL_TABLET | Freq: Four times a day (QID) | ORAL | Status: DC | PRN
  Administered 2017-01-11 – 2017-01-13 (×3): 650 mg via ORAL
  Filled 2017-01-10 (×3): qty 2

## 2017-01-10 MED ORDER — DEXTROSE 50 % IV SOLN
12.5000 g | Freq: Once | INTRAVENOUS | Status: AC
  Administered 2017-01-10: 12.5 g via INTRAVENOUS

## 2017-01-10 MED ORDER — DEXTROSE 10 % IV SOLN
INTRAVENOUS | Status: AC
  Administered 2017-01-10: 22:00:00 via INTRAVENOUS

## 2017-01-10 MED ORDER — SODIUM CHLORIDE 0.9 % IV BOLUS (SEPSIS)
1000.0000 mL | Freq: Once | INTRAVENOUS | Status: AC
  Administered 2017-01-10: 1000 mL via INTRAVENOUS

## 2017-01-10 MED ORDER — ACETAMINOPHEN 650 MG RE SUPP: 650.0000 mg | Freq: Four times a day (QID) | RECTAL | Status: DC | PRN

## 2017-01-10 MED ORDER — ONDANSETRON HCL 4 MG/2ML IJ SOLN: 4.0000 mg | Freq: Four times a day (QID) | INTRAMUSCULAR | Status: DC | PRN

## 2017-01-10 MED ORDER — METHYLPREDNISOLONE SODIUM SUCC 125 MG IJ SOLR
INTRAMUSCULAR | Status: AC
  Filled 2017-01-10: qty 2

## 2017-01-10 MED ORDER — ENOXAPARIN SODIUM 80 MG/0.8ML ~~LOC~~ SOLN
80.0000 mg | SUBCUTANEOUS | Status: DC
  Administered 2017-01-11 – 2017-01-13 (×3): 80 mg via SUBCUTANEOUS
  Filled 2017-01-10 (×3): qty 0.8

## 2017-01-10 MED ORDER — NITROGLYCERIN 0.4 MG SL SUBL
0.4000 mg | SUBLINGUAL_TABLET | SUBLINGUAL | Status: DC | PRN
  Administered 2017-01-11 (×3): 0.4 mg via SUBLINGUAL
  Filled 2017-01-10 (×2): qty 1

## 2017-01-10 MED ORDER — ISOSORBIDE MONONITRATE ER 30 MG PO TB24: 30.0000 mg | ORAL_TABLET | Freq: Every day | ORAL | Status: DC

## 2017-01-10 MED ORDER — MOMETASONE FURO-FORMOTEROL FUM 100-5 MCG/ACT IN AERO
2.0000 | INHALATION_SPRAY | Freq: Two times a day (BID) | RESPIRATORY_TRACT | Status: DC
  Administered 2017-01-11 – 2017-01-13 (×4): 2 via RESPIRATORY_TRACT
  Filled 2017-01-10: qty 8.8

## 2017-01-10 MED ORDER — DEXTROSE-NACL 5-0.45 % IV SOLN
INTRAVENOUS | Status: DC
  Administered 2017-01-10: 19:00:00 via INTRAVENOUS

## 2017-01-10 MED ORDER — ASPIRIN EC 81 MG PO TBEC
81.0000 mg | DELAYED_RELEASE_TABLET | Freq: Every day | ORAL | Status: DC
  Administered 2017-01-11 – 2017-01-13 (×3): 81 mg via ORAL
  Filled 2017-01-10 (×3): qty 1

## 2017-01-10 MED ORDER — DEXTROSE 50 % IV SOLN
1.0000 | Freq: Once | INTRAVENOUS | Status: AC
  Administered 2017-01-10: 50 mL via INTRAVENOUS
  Filled 2017-01-10: qty 50

## 2017-01-10 MED ORDER — PRAVASTATIN SODIUM 40 MG PO TABS
40.0000 mg | ORAL_TABLET | Freq: Every day | ORAL | Status: DC
  Administered 2017-01-11 – 2017-01-12 (×2): 40 mg via ORAL
  Filled 2017-01-10 (×2): qty 1

## 2017-01-10 MED ORDER — GABAPENTIN 300 MG PO CAPS
300.0000 mg | ORAL_CAPSULE | Freq: Three times a day (TID) | ORAL | Status: DC | PRN
  Administered 2017-01-12 – 2017-01-13 (×2): 300 mg via ORAL
  Filled 2017-01-10 (×2): qty 1

## 2017-01-10 MED ORDER — METHYLPREDNISOLONE SODIUM SUCC 125 MG IJ SOLR
125.0000 mg | Freq: Once | INTRAMUSCULAR | Status: AC
  Administered 2017-01-10: 125 mg via INTRAVENOUS
  Filled 2017-01-10: qty 2

## 2017-01-10 NOTE — H&P (Signed)
History and Physical    Charles [name] [id number] DOB: 11-21-1941 DOA: 01/10/2017  PCP: [name], MD   Patient coming from: Home  Chief Complaint: Diarrhea, weakness, lethargy, falls   HPI: Charles [name] is a 76 y.o. male with medical history significant for coronary artery disease, history of CVA, chronic diastolic CHF, paroxysmal atrial fibrillation, COPD, and insulin-dependent diabetes mellitus who presents to the emergency department with watery diarrhea, progressive lethargy and loss of appetite, generalized weakness, and recurrent falls. 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 He describes the development of progressive generalized weakness and lethargy over this interval, and notes that he is begun falling due to profound weakness. He had been on antibiotics for the right foot, but has completed them. He denies any recent fevers or chills and denies chest pain or palpitations. He also denies cough or dyspnea, but notes mild generalized abdominal tenderness and profuse watery diarrhea. He denies vomiting. There is been no melena or hematochezia. Patient denies striking his head or losing consciousness with any of the falls.    ED Course: Upon arrival to the ED, patient is found to be afebrile, saturating adequately on room air, blood pressure 80/43, and with normal heart rate and respirations. EKG features a sinus rhythm with low voltage QRS and chest x-ray is notable for mild cardiomegaly and mild vascular congestion. Chemistry panel features a hyponatremia and hypochloremia with elevated BUN to creatinine ratio, and serum glucose of only 41. Patient was treated with 2 L of normal saline and 1 ampule of 50% dextrose. Blood pressure improved only minimally and blood sugar did not come into the normal range. Patient was started on D5 half-normal infusion and given an  additional 500 mL normal saline bolus. Lactic acid is reassuring at 1.80 and troponin is elevated to a value of 0.54. Urine remains pending and blood cultures were obtained. Patient's blood pressure remained quite soft but is improving and he has not been in any acute respiratory distress. He will be admitted to the stepdown unit for ongoing evaluation and management of generalized weakness and falls with hypoglycemia, hypotension, and elevated troponin.  Review of Systems:  All other systems reviewed and apart from HPI, are negative.  Past Medical History:  Diagnosis Date  . Arthritis   . B12 deficiency   . CAD (coronary artery disease)    a. Approx. 2000 - MI. Cath: single vsl dz, PTCA dLAD/medical rx. ;  b. NSTEMI 11/13 => IVUS attempted for RCA but not successful; anatomy felt stable from 2000 => med Rx. c. [place] 08/2016: severe stable diffuse dLAD, severe prox RCA -> PCI of RCA unsuccessful, consider CABG for refractory angina.  . Cancer ([place])    skin cancer- head   . Cataracts   . Cholelithiasis   . Chronic diastolic CHF (congestive heart failure) ([place])   . Claustrophobia   . CVA (cerebral vascular accident) ([place]) ~ 2001   vision inparted from stroke.  Visual Memory loss  . Diabetic neuropathy ([place])   . Diverticulitis   . Dysrhythmia    if he does not take Metoprolol- Afib  . ED (erectile dysfunction)   . Essential hypertension   . History of MRSA infection    Right foot  . History of stomach ulcers   . HOH (hard of hearing)   . Hypercholesterolemia   . Legally blind   . Lower extremity edema   .  Morbid obesity ([place])   . Neuropathy ([place])   . OSA on CPAP   . PAF (paroxysmal atrial fibrillation) ([name])    a. confirmed by event monitor. b. 02/2014 rash on Coumadin, patient decided to discontinue Xarelto due to possible rash, cost and lawyers ads on TV, agreed to take Plavix.  . Pneumonia 2016  . Tunnel vision    Since stroke  . Type II diabetes mellitus ([place])   . Venous stasis      Past Surgical History:  Procedure Laterality Date  . CARDIAC CATHETERIZATION  1990's  . CARDIAC CATHETERIZATION N/A 08/17/2016   Procedure: Left Heart Cath and Coronary Angiography;  Surgeon: Peter M Martinique, MD;  Location: [facility];  Service: Cardiovascular;  Laterality: N/A;  . CARDIAC CATHETERIZATION N/A 08/19/2016   Procedure: Coronary Balloon Angioplasty;  Surgeon: Peter M Martinique, MD;  Location: [facility];  Service: Cardiovascular;  Laterality: N/A;  . CEREBRAL ANGIOGRAM  ~ 2000  . COLONOSCOPY    . LEFT HEART CATHETERIZATION WITH CORONARY ANGIOGRAM N/A 10/27/2012   Procedure: LEFT HEART CATHETERIZATION WITH CORONARY ANGIOGRAM;  Surgeon: Burnell Blanks, MD;  Location: [facility] CATH LAB;  Service: Cardiovascular;  Laterality: N/A;  . TOE AMPUTATION  2006; 2009   "Dr. Blenda Mounts; big toe left foot; little toe on right foot" (10/26/2012)  . TOE AMPUTATION Right 12/24/2015   2nd & 3 toes/notes 1/13//2017  . TOE AMPUTATION Right    5TH TOE  . TRANSMETATARSAL AMPUTATION Right 10/13/2016   Procedure: TRANSMETATARSAL AMPUTATION;  Surgeon: Trula Slade, DPM;  Location: [facility];  Service: Podiatry;  Laterality: Right;     reports that he has quit smoking. His smoking use included Cigarettes and Cigars. He has a 30.00 pack-year smoking history. He quit smokeless tobacco use about 4 months ago. He reports that he drinks alcohol. He reports that he does not use drugs.  Allergies  Allergen Reactions  . Xarelto [Rivaroxaban] Rash    Family History  Problem Relation Age of Onset  . Breast cancer Mother 45  . Heart disease Father 36  . Asthma Neg Hx      Prior to Admission medications   Medication Sig Start Date End Date Taking? Authorizing Provider  aspirin EC 81 MG tablet Take 81 mg by mouth daily.    Historical Provider, MD  Cholecalciferol (EQL VITAMIN D3) 1000 UNITS tablet Take 1,000 Units by mouth daily.      Historical Provider, MD  ciprofloxacin (CIPRO) 500 MG  tablet Take 1 tablet (500 mg total) by mouth 2 (two) times daily. 11/26/16   Trula Slade, DPM  clindamycin (CLEOCIN) 300 MG capsule Take 1 capsule (300 mg total) by mouth 3 (three) times daily. 11/26/16   Trula Slade, DPM  clopidogrel (PLAVIX) 75 MG tablet Take 1 tablet (75 mg total) by mouth daily. 06/02/16   Aleksei Plotnikov V, MD  collagenase (SANTYL) ointment Apply 1 application topically daily. 11/29/16   Trula Slade, DPM  Fluticasone-Salmeterol (ADVAIR DISKUS) 100-50 MCG/DOSE AEPB Inhale 1 puff into the lungs 2 (two) times daily. Patient taking differently: Inhale 1 puff into the lungs 2 (two) times daily as needed (for shortness of breath).  03/02/16   Lew Dawes V, MD  furosemide (LASIX) 80 MG tablet Take 1/2 tablet daily alternating with whole tablet daily Patient taking differently: Take 40-80 mg by mouth See admin instructions. Take 1/2 tablet daily alternating with whole tablet daily 06/02/16   Cassandria Anger, MD  gabapentin (NEURONTIN) 300 MG capsule TAKE 1 CAPSULE THREE TIMES DAILY AS NEEDED FOR NERVE PAIN Patient taking differently: Take 300 mg by mouth 2 (two) times daily.  06/02/16   Aleksei Plotnikov V, MD  glimepiride (AMARYL) 4 MG tablet Take 1 tablet (4 mg total) by mouth 2 (two) times daily after a meal. 10/26/16   Charlene Brooke Nche, NP  insulin aspart (NOVOLOG FLEXPEN) 100 UNIT/ML FlexPen Use 5-20 units four times a day after meals and at bedtime Per sliding scale 100-120 cbg Patient taking differently: Inject 3-12 Units into the skin See admin instructions. Use 3-12 units four times a day after meals and at bedtime as needed per sliding scale 100-120 cbg 03/12/16   Aleksei Plotnikov V, MD  isosorbide mononitrate (IMDUR) 30 MG 24 hr tablet Take 1 tablet (30 mg total) by mouth daily. 10/26/16   Flossie Buffy, NP  losartan (COZAAR) 100 MG tablet Take 1 tablet (100 mg total) by mouth daily. 10/26/16   Flossie Buffy, NP  metoprolol tartrate  (LOPRESSOR) 25 MG tablet TAKE ONE TABLET BY MOUTH TWICE DAILY 01/05/17   Fay Records, MD  nitroGLYCERIN (NITROSTAT) 0.4 MG SL tablet Place 1 tablet (0.4 mg total) under the tongue every 5 (five) minutes as needed (up to 3 doses). For chest pain 08/20/16   Charlie Pitter, PA-C  NONFORMULARY OR COMPOUNDED ITEM [facility]:  Peripheral Neuropathy Cream - Bupivacaine 1%, Doxepin 3%, Gabapentin 6%, Pentoxifylline 3%, Topiramate 1%, apply 1-2 grams to affected area 3-4 times daily. 11/29/16   Trula Slade, DPM  pioglitazone (ACTOS) 45 MG tablet Take 1 tablet (45 mg total) by mouth daily. 06/02/16   Aleksei Plotnikov V, MD  potassium chloride SA (K-DUR,KLOR-CON) 20 MEQ tablet Take 1 tablet (20 mEq total) by mouth daily. 08/20/16   Dayna N Dunn, PA-C  pravastatin (PRAVACHOL) 40 MG tablet Take 1 tablet (40 mg total) by mouth daily. 06/02/16   Aleksei Plotnikov V, MD  triamcinolone cream (KENALOG) 0.5 % Apply 1 application topically 2 (two) times daily as needed (rash). 04/18/15   Aleksei Plotnikov V, MD  vitamin B-12 (CYANOCOBALAMIN) 1000 MCG tablet Take 1,000 mcg by mouth daily.      Historical Provider, MD    Physical Exam: Vitals:   01/10/17 1845 01/10/17 1900 01/10/17 1915 01/10/17 1930  BP: (!) 101/40 (!) 90/47 (!) 103/46 (!) 107/47  Pulse: 68 81 81 80  Resp: 16 21 23 23   Temp:      TempSrc:      SpO2: 98% 99% 97% 97%  Weight:      Height:          Constitutional: No acute distress, calm, comfortable. Obese, chronically-ill appearing  Eyes: PERTLA, lids and conjunctivae normal ENMT: Mucous membranes are moist. Posterior pharynx clear of any exudate or lesions.   Neck: normal, supple, no masses, no thyromegaly Respiratory: clear to auscultation bilaterally, no wheezing, no crackles. Normal respiratory effort.   Cardiovascular: S1 & S2 heard, regular rate and rhythm. JVP not well-visualized. Trace pretibial edema bilaterally.  Abdomen: No distension, generally tender without rebound pain or  guarding, no masses palpated. Bowel sounds present.  Musculoskeletal: no clubbing / cyanosis. Status-post right TMA. No red, hot, swollen joint.  Skin: no significant rashes, lesions, ulcers. Warm, dry, well-perfused. Neurologic: CN 2-12 grossly intact. Sensation to light touch intact throughout, patellar DTR's normal b/l.  Psychiatric: Normal judgment and insight. Alert and oriented x 3. Normal mood and affect.  Labs on Admission: I have personally reviewed following labs and imaging studies  CBC:  Recent Labs Lab 01/10/17 1628  WBC 18.2*  NEUTROABS 15.6*  HGB 11.5*  HCT 35.3*  MCV 91.9  PLT [id number]   Basic Metabolic Panel:  Recent Labs Lab 01/10/17 1628  NA 133*  K 4.5  CL 100*  CO2 24  GLUCOSE 41*  BUN 29*  CREATININE 1.19  CALCIUM 8.7*   GFR: Estimated Creatinine Clearance: 89.1 mL/min (by C-G formula based on SCr of 1.19 mg/dL). Liver Function Tests:  Recent Labs Lab 01/10/17 1628  AST 36  ALT 13*  ALKPHOS 50  BILITOT 1.3*  PROT 5.6*  ALBUMIN 2.8*   No results for input(s): LIPASE, AMYLASE in the last 168 hours. No results for input(s): AMMONIA in the last 168 hours. Coagulation Profile: No results for input(s): INR, PROTIME in the last 168 hours. Cardiac Enzymes:  Recent Labs Lab 01/10/17 1628  TROPONINI 0.54*   BNP (last 3 results) No results for input(s): PROBNP in the last 8760 hours. HbA1C: No results for input(s): HGBA1C in the last 72 hours. CBG:  Recent Labs Lab 01/10/17 1642 01/10/17 1704 01/10/17 1810 01/10/17 1858 01/10/17 1956  GLUCAP 63* 115* 83 72 80   Lipid Profile: No results for input(s): CHOL, HDL, LDLCALC, TRIG, CHOLHDL, LDLDIRECT in the last 72 hours. Thyroid Function Tests: No results for input(s): TSH, T4TOTAL, FREET4, T3FREE, THYROIDAB in the last 72 hours. Anemia Panel: No results for input(s): VITAMINB12, FOLATE, FERRITIN, TIBC, IRON, RETICCTPCT in the last 72 hours. Urine analysis:    Component Value  Date/Time   COLORURINE YELLOW 10/17/2016 0127   APPEARANCEUR CLEAR 10/17/2016 0127   LABSPEC 1.018 10/17/2016 0127   PHURINE 5.5 10/17/2016 0127   GLUCOSEU NEGATIVE 10/17/2016 0127   GLUCOSEU NEGATIVE 08/28/2010 0924   HGBUR NEGATIVE 10/17/2016 0127   BILIRUBINUR NEGATIVE 10/17/2016 0127   KETONESUR NEGATIVE 10/17/2016 0127   PROTEINUR NEGATIVE 10/17/2016 0127   UROBILINOGEN 1.0 07/30/2014 1827   NITRITE NEGATIVE 10/17/2016 0127   LEUKOCYTESUR SMALL (A) 10/17/2016 0127   Sepsis Labs: @LABRCNTIP (procalcitonin:4,lacticidven:4) )No results found for this or any previous visit (from the past 240 hour(s)).   Radiological Exams on Admission: Dg Chest 2 View  Result Date: 01/10/2017 CLINICAL DATA:  Acute onset of worsening generalized weakness. Initial encounter. EXAM: CHEST  2 VIEW COMPARISON:  Chest radiograph performed 10/17/2016 FINDINGS: The lungs are well-aerated. Mild vascular congestion is suggested. Mild left basilar atelectasis is seen. There is no evidence of pleural effusion or pneumothorax. The heart is mildly enlarged. No acute osseous abnormalities are seen. IMPRESSION: Mild vascular congestion and mild cardiomegaly. Mild left basilar atelectasis noted. Lungs otherwise clear. Electronically Signed   By: Garald Balding M.D.   On: 01/10/2017 17:26    EKG: Independently reviewed. Sinus rhythm, low-voltage QRS  Assessment/Plan  1. Hypoglycemia, type II DM  - Serum glucose 41 on arrival, improving only transiently with multiple ampules D-50%, likely secondary to continue sulfonylurea despite poor appetite - A1c was 6.3% in September 2017 and he is managed with glimepiride and pioglitazone at home; these are held on admission  - He will be kept on dextrose infusion for now with frequent CBG's    2. Hypotension, hx of HTN  - MAP persisting <65 in ED, eventually coming up and remaining stable after total of 2.5 liters NS - There is leukocytosis, suggesting possible infectious  etiology, but lactate wnl, there is no fever, and pt is non-toxic appearing - Troponin  elevation concerning for possible ischemic etiology, but there is no angina or concerning EKG changes - No hx of chronic steroids or adrenal insufficiency  - Suspect this is secondary to intravascular volume depletion in setting of diarrhea and poor oral intake, with continued use of Lasix and antihypertensives  - Plan to continue gentle IVF hydration, hold antihypertensives, trend troponin, check cortisol, obtain blood and urine cultures    3. Diarrhea    - Pt reports non-bloody diarrhea without abdominal pain or vomiting  - He has been on abx recently for a foot infection; there is leukocytosis to 18,200 on admission - C diff and GI pathogen panel ordered; enteric precautions - Monitor and replace volume and electrolytes as needed   4. CAD, elevated troponin - No anginal complaints - Troponin elevated to 0.54; EKG without appreciable ischemic changes  - Likely secondary to demand ischemia in setting of acute illness with hypotension  - He has known CAD 90% occlusion of RCA with unsuccessful PCI attempt in September 2017; cardiology noted that CABG may be necessary if he goes on to have recurrent ACS  - Plan to monitor on telemetry for ischemic changes, trend troponin, continue ASA, Plavix, nitrates, and statin; Lopressor on hold given low BP in ED   5. History of CVA  - No evidence for acute CVA on admission  - Continue ASA, Plavix, statin -   6. Chronic diastolic CHF  - Appears intravascularly depleted on arrival, but there is some peripheral edema and congestion on CXR  - TTE (08/17/16) with EF 0000000, grade 1 diastolic dysfunction, mild LAE - Hold Lopressor, losartan, and Lasix in light of hypotension  - Follow strict I/O's and daily wts, fluid-restrict diet   7. Atrial fibrillation, paroxysmal  - In a sinus rhythm on admission  - CHADS-VASc is (age, CHF,DM, CAD, hx CVA x2) - Per cardiology  notes, he has refused to be anticoagulated  - Continue ASA and Plavix; resume Lopressor as BP allows    8. Normocytic anemia  - Hgb is stable on admission at 11.5; no bleeding apparent  - Attributed to chronic disease and B-12 deficiency, will continue suppelemntation   DVT prophylaxis: sq Lovenox  Code Status: Full  Family Communication: Discussed with patient Disposition Plan: Admit to stepdown  Consults called: None Admission status: Inpatient    Vianne Bulls, MD [facility] Pager [phone]  If 7PM-7AM, please contact night-coverage [email or web address] Password [facility]  01/10/2017, 8:26 PM

## 2017-01-10 NOTE — ED Notes (Signed)
Pt given urinal -- instructed to use and give UA

## 2017-01-10 NOTE — Telephone Encounter (Addendum)
Pt's wife, Charles [name] states pt is unable to participate in home PT, pt can not sit, walk or turn without assistance and has fallen multiple times since last week. Charles [name] states it is a struggle to get pt to the bathroom or sitting up. I asked Charles [name] if she felt pt would be better in assisted living facility and she said yes. D. Miner had offer an appt, but pt can not get to the car per his wife. I told her I would check with Dr. Jacqualyn Posey, but felt his PCP would be better for getting pt to the proper facility. I informed Charles [name] of Dr. Leigh Aurora recommendation and Charles [name] states she was informed by the in-home PT spoke with his supervisor and if the family took pt to the hospital for his safety, the hospital would begin the process of getting pt into a In-patient facility. Charles [name] states she will get pt to the hospital, by ambulance and call us with up date. Charles [name], PT states pt is not able to participate in home PT, and was not accepted in to the [facility] program.01/18/2017-Pt's wife, Charles [name] states pt is in [place] and in good spirits, getting excellent care, after a rough time in [facility] with C-Diff and no one changing the dressings except her. 02/09/2017-Charles [name], pt's wife states Dr. Jacqualyn Posey had wanted pt to continue the Keflex until he was seen, next appt is 02/11/2017. Dr. Jacqualyn Posey ordered Keflex 500mg  #14 one capsule bid.

## 2017-01-10 NOTE — ED Notes (Signed)
Pt given pb crackers and orange juice

## 2017-01-10 NOTE — ED Triage Notes (Signed)
Pt from home by [facility] for weakness increasing over the last 3 days. Pt has part of foot amputated 3 months ago and has got worse since. Pt has fallen 3x over the last few days due to weakness.

## 2017-01-10 NOTE — ED Provider Notes (Signed)
[facility] DEPT Provider Note   CSN: [id number] Arrival date & time: 01/10/17  1600     History   Chief Complaint Chief Complaint  Patient presents with  . Weakness    HPI Charles [name] is a 76 y.o. male.  HPI Patient presents with generalized weakness. He has had it for the last few days. Reportedly has been too weak to get out of bed. Needs help to get to the bathroom. He has had diarrhea for the last few days also. Decreased oral intake. Decreased appetite. Has reportedly been doing somewhat worse since foot amputation a month or 2 ago. Has had some falls due to his weakness. No injury from the falls. Had reportedly been on antibiotics for the infected foot. Patient initially stated he finished somewhat week ago now states that he finished about 3 weeks ago. No abdominal pain. No fevers. No cough. Family states that he needs to go to a rehabilitation place as he is not managing at home.   Past Medical History:  Diagnosis Date  . Arthritis   . B12 deficiency   . CAD (coronary artery disease)    a. Approx. 2000 - MI. Cath: single vsl dz, PTCA dLAD/medical rx. ;  b. NSTEMI 11/13 => IVUS attempted for RCA but not successful; anatomy felt stable from 2000 => med Rx. c. [place] 08/2016: severe stable diffuse dLAD, severe prox RCA -> PCI of RCA unsuccessful, consider CABG for refractory angina.  . Cancer ([place])    skin cancer- head   . Cataracts   . Cholelithiasis   . Chronic diastolic CHF (congestive heart failure) ([place])   . Claustrophobia   . CVA (cerebral vascular accident) ([place]) ~ 2001   vision inparted from stroke.  Visual Memory loss  . Diabetic neuropathy ([place])   . Diverticulitis   . Dysrhythmia    if he does not take Metoprolol- Afib  . ED (erectile dysfunction)   . Essential hypertension   . History of MRSA infection    Right foot  . History of stomach ulcers   . HOH (hard of hearing)   . Hypercholesterolemia   . Legally blind   . Lower extremity edema   . Morbid  obesity ([place])   . Neuropathy ([place])   . OSA on CPAP   . PAF (paroxysmal atrial fibrillation) ([name])    a. confirmed by event monitor. b. 02/2014 rash on Coumadin, patient decided to discontinue Xarelto due to possible rash, cost and lawyers ads on TV, agreed to take Plavix.  . Pneumonia 2016  . Tunnel vision    Since stroke  . Type II diabetes mellitus ([place])   . Venous stasis     Patient Active Problem List   Diagnosis Date Noted  . Hypoglycemia associated with diabetes ([place]) 10/17/2016  . Chronic ulcer of left foot ([place]) 08/31/2016  . Acute on chronic diastolic (congestive) heart failure 08/20/2016  . Venous stasis   . Lower extremity edema   . Hypercholesterolemia   . Diabetic neuropathy ([place])   . Unstable angina ([place]) 08/15/2016  . 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 Pressure ulcer 12/27/2015  . Hypoglycemia 12/26/2015  . Gangrene of toe ([place]) 12/26/2015  . Acute renal failure (ARF) ([place]) 12/26/2015  . Subconjunctival hemorrhage 10/21/2015  . Cellulitis in diabetic foot ([place])   . Cellulitis 05/06/2015  . COPD exacerbation ([place]) 10/10/2014  . Hematuria 08/12/2014  . Hypokalemia 08/12/2014  . Urinary retention 08/03/2014  . Hypotension 07/26/2014  . COPD  07/26/2014  .  Rash and nonspecific skin eruption 02/13/2014  . PAF (paroxysmal atrial fibrillation) ([place]) 11/13/2013  . Morbid obesity ([place]) 05/24/2012  . Type 2 diabetes, uncontrolled, with retinopathy ([place]) 02/09/2012  . NEOPLASM OF UNCERTAIN BEHAVIOR OF SKIN 12/24/2010  . WEIGHT GAIN, ABNORMAL 02/02/2010  . TOBACCO USE, QUIT 02/02/2010  . HYPOGONADISM 07/12/2007  . B12 deficiency 07/12/2007  . Essential hypertension 07/12/2007  . MYOCARDIAL INFARCTION, HX OF 07/12/2007  . CAD in native artery 07/12/2007  . History of CVA (cerebrovascular accident) 07/12/2007  . VENOUS INSUFFICIENCY, LEGS 07/12/2007  . SYMPTOM, MEMORY LOSS 07/12/2007  . DIVERTICULITIS, HX OF 07/12/2007    Past Surgical History:  Procedure  Laterality Date  . CARDIAC CATHETERIZATION  1990's  . CARDIAC CATHETERIZATION N/A 08/17/2016   Procedure: Left Heart Cath and Coronary Angiography;  Surgeon: Peter M Martinique, MD;  Location: [facility];  Service: Cardiovascular;  Laterality: N/A;  . CARDIAC CATHETERIZATION N/A 08/19/2016   Procedure: Coronary Balloon Angioplasty;  Surgeon: Peter M Martinique, MD;  Location: [facility];  Service: Cardiovascular;  Laterality: N/A;  . CEREBRAL ANGIOGRAM  ~ 2000  . COLONOSCOPY    . LEFT HEART CATHETERIZATION WITH CORONARY ANGIOGRAM N/A 10/27/2012   Procedure: LEFT HEART CATHETERIZATION WITH CORONARY ANGIOGRAM;  Surgeon: Burnell Blanks, MD;  Location: [facility] CATH LAB;  Service: Cardiovascular;  Laterality: N/A;  . TOE AMPUTATION  2006; 2009   "Dr. Blenda Mounts; big toe left foot; little toe on right foot" (10/26/2012)  . TOE AMPUTATION Right 12/24/2015   2nd & 3 toes/notes 1/13//2017  . TOE AMPUTATION Right    5TH TOE  . TRANSMETATARSAL AMPUTATION Right 10/13/2016   Procedure: TRANSMETATARSAL AMPUTATION;  Surgeon: Trula Slade, DPM;  Location: [facility];  Service: Podiatry;  Laterality: Right;       Home Medications    Prior to Admission medications   Medication Sig Start Date End Date Taking? Authorizing Provider  aspirin EC 81 MG tablet Take 81 mg by mouth daily.    Historical Provider, MD  Cholecalciferol (EQL VITAMIN D3) 1000 UNITS tablet Take 1,000 Units by mouth daily.      Historical Provider, MD  ciprofloxacin (CIPRO) 500 MG tablet Take 1 tablet (500 mg total) by mouth 2 (two) times daily. 11/26/16   Trula Slade, DPM  clindamycin (CLEOCIN) 300 MG capsule Take 1 capsule (300 mg total) by mouth 3 (three) times daily. 11/26/16   Trula Slade, DPM  clopidogrel (PLAVIX) 75 MG tablet Take 1 tablet (75 mg total) by mouth daily. 06/02/16   Aleksei Plotnikov V, MD  collagenase (SANTYL) ointment Apply 1 application topically daily. 11/29/16   Trula Slade, DPM    Fluticasone-Salmeterol (ADVAIR DISKUS) 100-50 MCG/DOSE AEPB Inhale 1 puff into the lungs 2 (two) times daily. Patient taking differently: Inhale 1 puff into the lungs 2 (two) times daily as needed (for shortness of breath).  03/02/16   Lew Dawes V, MD  furosemide (LASIX) 80 MG tablet Take 1/2 tablet daily alternating with whole tablet daily Patient taking differently: Take 40-80 mg by mouth See admin instructions. Take 1/2 tablet daily alternating with whole tablet daily 06/02/16   Aleksei Plotnikov V, MD  gabapentin (NEURONTIN) 300 MG capsule TAKE 1 CAPSULE THREE TIMES DAILY AS NEEDED FOR NERVE PAIN Patient taking differently: Take 300 mg by mouth 2 (two) times daily.  06/02/16   Aleksei Plotnikov V, MD  glimepiride (AMARYL) 4 MG tablet Take 1 tablet (4 mg total) by mouth 2 (two) times  daily after a meal. 10/26/16   Charlene Brooke Nche, NP  insulin aspart (NOVOLOG FLEXPEN) 100 UNIT/ML FlexPen Use 5-20 units four times a day after meals and at bedtime Per sliding scale 100-120 cbg Patient taking differently: Inject 3-12 Units into the skin See admin instructions. Use 3-12 units four times a day after meals and at bedtime as needed per sliding scale 100-120 cbg 03/12/16   Aleksei Plotnikov V, MD  isosorbide mononitrate (IMDUR) 30 MG 24 hr tablet Take 1 tablet (30 mg total) by mouth daily. 10/26/16   Flossie Buffy, NP  losartan (COZAAR) 100 MG tablet Take 1 tablet (100 mg total) by mouth daily. 10/26/16   Flossie Buffy, NP  metoprolol tartrate (LOPRESSOR) 25 MG tablet TAKE ONE TABLET BY MOUTH TWICE DAILY 01/05/17   Fay Records, MD  nitroGLYCERIN (NITROSTAT) 0.4 MG SL tablet Place 1 tablet (0.4 mg total) under the tongue every 5 (five) minutes as needed (up to 3 doses). For chest pain 08/20/16   Charlie Pitter, PA-C  NONFORMULARY OR COMPOUNDED ITEM [facility]:  Peripheral Neuropathy Cream - Bupivacaine 1%, Doxepin 3%, Gabapentin 6%, Pentoxifylline 3%, Topiramate 1%, apply 1-2 grams to  affected area 3-4 times daily. 11/29/16   Trula Slade, DPM  pioglitazone (ACTOS) 45 MG tablet Take 1 tablet (45 mg total) by mouth daily. 06/02/16   Aleksei Plotnikov V, MD  potassium chloride SA (K-DUR,KLOR-CON) 20 MEQ tablet Take 1 tablet (20 mEq total) by mouth daily. 08/20/16   Dayna N Dunn, PA-C  pravastatin (PRAVACHOL) 40 MG tablet Take 1 tablet (40 mg total) by mouth daily. 06/02/16   Aleksei Plotnikov V, MD  triamcinolone cream (KENALOG) 0.5 % Apply 1 application topically 2 (two) times daily as needed (rash). 04/18/15   Aleksei Plotnikov V, MD  vitamin B-12 (CYANOCOBALAMIN) 1000 MCG tablet Take 1,000 mcg by mouth daily.      Historical Provider, MD    Family History Family History  Problem Relation Age of Onset  . Breast cancer Mother 42  . Heart disease Father 61  . Asthma Neg Hx     Social History Social History  Substance Use Topics  . Smoking status: Former Smoker    Packs/day: 1.00    Years: 30.00    Types: Cigarettes, Cigars  . Smokeless tobacco: Former Systems developer    Quit date: 08/15/2016     Comment: 10/26/2012 "quit smoking cigarettes  in ~ 1996; wife smokes in the house"  . Alcohol use Yes     Comment: 10/26/2012 "have a drink maybe twice/yr"     Allergies   Xarelto [rivaroxaban]   Review of Systems Review of Systems  Constitutional: Positive for appetite change and fatigue. Negative for fever and unexpected weight change.  HENT: Negative for congestion.   Eyes: Negative for pain.  Respiratory: Negative for shortness of breath.   Cardiovascular: Negative for chest pain.  Gastrointestinal: Positive for diarrhea. Negative for constipation.  Genitourinary: Negative for flank pain.  Musculoskeletal: Negative for back pain.  Neurological: Positive for weakness.  Hematological: Negative for adenopathy.  Psychiatric/Behavioral: Negative for confusion.     Physical Exam Updated Vital Signs BP (!) 103/46   Pulse 81   Temp 98.7 F (37.1 C) (Oral)   Resp 23    Ht 6\' 4"  (1.93 m)   Wt (!) 360 lb (163.3 kg)   SpO2 97%   BMI 43.82 kg/m   Physical Exam  Constitutional: He appears well-developed.  HENT:  Head: Atraumatic.  Eyes: Pupils are equal, round, and reactive to light.  Cardiovascular: Normal rate.   Pulmonary/Chest: Effort normal.  Abdominal: Soft. There is no tenderness. There is no guarding.  Musculoskeletal: Normal range of motion.  Right forefoot amputation. Appears to be well-healing. Slight erythema without drainage.  Neurological: He is alert.  Skin: Skin is warm. Capillary refill takes less than 2 seconds.     ED Treatments / Results  Labs (all labs ordered are listed, but only abnormal results are displayed) Labs Reviewed  COMPREHENSIVE METABOLIC PANEL - Abnormal; Notable for the following:       Result Value   Sodium 133 (*)    Chloride 100 (*)    Glucose, Bld 41 (*)    BUN 29 (*)    Calcium 8.7 (*)    Total Protein 5.6 (*)    Albumin 2.8 (*)    ALT 13 (*)    Total Bilirubin 1.3 (*)    GFR calc non Af Amer 58 (*)    All other components within normal limits  CBC WITH DIFFERENTIAL/PLATELET - Abnormal; Notable for the following:    WBC 18.2 (*)    RBC 3.84 (*)    Hemoglobin 11.5 (*)    HCT 35.3 (*)    RDW 16.0 (*)    Neutro Abs 15.6 (*)    Monocytes Absolute 1.7 (*)    All other components within normal limits  TROPONIN I - Abnormal; Notable for the following:    Troponin I 0.54 (*)    All other components within normal limits  CBG MONITORING, ED - Abnormal; Notable for the following:    Glucose-Capillary 42 (*)    All other components within normal limits  CBG MONITORING, ED - Abnormal; Notable for the following:    Glucose-Capillary 63 (*)    All other components within normal limits  CBG MONITORING, ED - Abnormal; Notable for the following:    Glucose-Capillary 115 (*)    All other components within normal limits  URINALYSIS, ROUTINE W REFLEX MICROSCOPIC  I-STAT CG4 LACTIC ACID, ED  CBG MONITORING,  ED  CBG MONITORING, ED    EKG  EKG Interpretation  Date/Time:  Monday January 10 2017 16:07:51 EST Ventricular Rate:  77 PR Interval:    QRS Duration: 97 QT Interval:  389 QTC Calculation: 441 R Axis:   19 Text Interpretation:  Sinus rhythm Short PR interval Low voltage, precordial leads Abnormal inferior Q waves Confirmed by Alvino Chapel  MD, Capri Raben [phone]) on 01/10/2017 5:48:50 PM       Radiology Dg Chest 2 View  Result Date: 01/10/2017 CLINICAL DATA:  Acute onset of worsening generalized weakness. Initial encounter. EXAM: CHEST  2 VIEW COMPARISON:  Chest radiograph performed 10/17/2016 FINDINGS: The lungs are well-aerated. Mild vascular congestion is suggested. Mild left basilar atelectasis is seen. There is no evidence of pleural effusion or pneumothorax. The heart is mildly enlarged. No acute osseous abnormalities are seen. IMPRESSION: Mild vascular congestion and mild cardiomegaly. Mild left basilar atelectasis noted. Lungs otherwise clear. Electronically Signed   By: Garald Balding M.D.   On: 01/10/2017 17:26    Procedures Procedures (including critical care time)  Medications Ordered in ED Medications  dextrose 5 %-0.45 % sodium chloride infusion ( Intravenous New Bag/Given 01/10/17 1917)  sodium chloride 0.9 % bolus 500 mL (500 mLs Intravenous New Bag/Given 01/10/17 1916)  sodium chloride 0.9 % bolus 1,000 mL (0 mLs Intravenous Stopped 01/10/17 1742)  dextrose 50 % solution 50 mL (50 mLs  Intravenous Given 01/10/17 1642)  sodium chloride 0.9 % bolus 500 mL (0 mLs Intravenous Stopped 01/10/17 1851)  dextrose 50 % solution 12.5 g (12.5 g Intravenous Given 01/10/17 1919)     Initial Impression / Assessment and Plan / ED Course  I have reviewed the triage vital signs and the nursing notes.  Pertinent labs & imaging results that were available during my care of the patient were reviewed by me and considered in my medical decision making (see chart for details).     Patient with  generalized weakness. Has been dwindling for the last couple months. Worse the last few days. Reportedly is unable to get out of bed for a while now. Troponin mildly elevated. Has recurrent hypoglycemia. Required D5 despite eating and D50. Will admit to internal medicine. EKG reassuring. No chest pain. Elevated troponin likely secondary to demand  CRITICAL CARE Performed by: Mackie Pai. Total critical care time: 30 minutes Critical care time was exclusive of separately billable procedures and treating other patients. Critical care was necessary to treat or prevent imminent or life-threatening deterioration. Critical care was time spent personally by me on the following activities: development of treatment plan with patient and/or surrogate as well as nursing, discussions with consultants, evaluation of patient's response to treatment, examination of patient, obtaining history from patient or surrogate, ordering and performing treatments and interventions, ordering and review of laboratory studies, ordering and review of radiographic studies, pulse oximetry and re-evaluation of patient's condition.   Final Clinical Impressions(s) / ED Diagnoses   Final diagnoses:  Dehydration  Elevated troponin  Hypoglycemia    New Prescriptions New Prescriptions   No medications on file     Davonna Belling, MD 01/10/17 1930

## 2017-01-10 NOTE — ED Notes (Signed)
Gave pt Orange juice per [employer]

## 2017-01-10 NOTE — ED Notes (Signed)
Pt family requesting RN to call when pt is moved to inpatient bed -- Baldo Ash (daughter) [phone]

## 2017-01-11 ENCOUNTER — Encounter (HOSPITAL_COMMUNITY): Payer: Self-pay | Admitting: General Practice

## 2017-01-11 DIAGNOSIS — E1165 Type 2 diabetes mellitus with hyperglycemia: Secondary | ICD-10-CM

## 2017-01-11 DIAGNOSIS — E162 Hypoglycemia, unspecified: Secondary | ICD-10-CM | POA: Diagnosis present

## 2017-01-11 DIAGNOSIS — J439 Emphysema, unspecified: Secondary | ICD-10-CM

## 2017-01-11 DIAGNOSIS — A09 Infectious gastroenteritis and colitis, unspecified: Secondary | ICD-10-CM

## 2017-01-11 DIAGNOSIS — I248 Other forms of acute ischemic heart disease: Secondary | ICD-10-CM

## 2017-01-11 DIAGNOSIS — R531 Weakness: Secondary | ICD-10-CM

## 2017-01-11 DIAGNOSIS — Z794 Long term (current) use of insulin: Secondary | ICD-10-CM

## 2017-01-11 DIAGNOSIS — I2510 Atherosclerotic heart disease of native coronary artery without angina pectoris: Secondary | ICD-10-CM

## 2017-01-11 DIAGNOSIS — I959 Hypotension, unspecified: Secondary | ICD-10-CM

## 2017-01-11 DIAGNOSIS — E113399 Type 2 diabetes mellitus with moderate nonproliferative diabetic retinopathy without macular edema, unspecified eye: Secondary | ICD-10-CM

## 2017-01-11 HISTORY — DX: Weakness: R53.1

## 2017-01-11 LAB — CBG MONITORING, ED
GLUCOSE-CAPILLARY: 68 mg/dL (ref 65–99)
GLUCOSE-CAPILLARY: 116 mg/dL — AB (ref 65–99)
GLUCOSE-CAPILLARY: 207 mg/dL — AB (ref 65–99)
Glucose-Capillary: 136 mg/dL — ABNORMAL HIGH (ref 65–99)
Glucose-Capillary: 317 mg/dL — ABNORMAL HIGH (ref 65–99)
Glucose-Capillary: 75 mg/dL (ref 65–99)

## 2017-01-11 LAB — COMPREHENSIVE METABOLIC PANEL
ALBUMIN: 2.5 g/dL — AB (ref 3.5–5.0)
ALT: 15 U/L — ABNORMAL LOW (ref 17–63)
ANION GAP: 7 (ref 5–15)
AST: 24 U/L (ref 15–41)
Alkaline Phosphatase: 48 U/L (ref 38–126)
BUN: 23 mg/dL — ABNORMAL HIGH (ref 6–20)
CHLORIDE: 103 mmol/L (ref 101–111)
CO2: 26 mmol/L (ref 22–32)
Calcium: 8.6 mg/dL — ABNORMAL LOW (ref 8.9–10.3)
Creatinine, Ser: 0.87 mg/dL (ref 0.61–1.24)
GFR calc Af Amer: >60 mL/min (ref >60)
GFR calc non Af Amer: >60 mL/min (ref >60)
GLUCOSE: 112 mg/dL — AB (ref 65–99)
POTASSIUM: 4.4 mmol/L (ref 3.5–5.1)
SODIUM: 136 mmol/L (ref 135–145)
TOTAL PROTEIN: 5.5 g/dL — AB (ref 6.5–8.1)
Total Bilirubin: 0.4 mg/dL (ref 0.3–1.2)

## 2017-01-11 LAB — TROPONIN I
TROPONIN I: 0.18 ng/mL — AB (ref <0.03)
Troponin I: 0.24 ng/mL (ref <0.03)

## 2017-01-11 LAB — CBC
HEMATOCRIT: 33.8 % — AB (ref 39.0–52.0)
HEMOGLOBIN: 10.9 g/dL — AB (ref 13.0–17.0)
MCH: 29.5 pg (ref 26.0–34.0)
MCHC: 32.2 g/dL (ref 30.0–36.0)
MCV: 91.4 fL (ref 78.0–100.0)
Platelets: 207 10*3/uL (ref 150–400)
RBC: 3.7 MIL/uL — ABNORMAL LOW (ref 4.22–5.81)
RDW: 15.8 % — ABNORMAL HIGH (ref 11.5–15.5)
WBC: 11.4 10*3/uL — ABNORMAL HIGH (ref 4.0–10.5)

## 2017-01-11 LAB — CBC AND DIFFERENTIAL
HEMATOCRIT: 34 % — AB (ref 41–53)
HEMOGLOBIN: 10.9 g/dL — AB (ref 13.5–17.5)
Platelets: 207 10*3/uL (ref 150–399)
WBC: 11.4 10^3/mL

## 2017-01-11 LAB — HEPATIC FUNCTION PANEL: BILIRUBIN, TOTAL: 0.4 mg/dL

## 2017-01-11 LAB — GLUCOSE, CAPILLARY: GLUCOSE-CAPILLARY: 330 mg/dL — AB (ref 65–99)

## 2017-01-11 MED ORDER — ISOSORBIDE MONONITRATE ER 30 MG PO TB24
30.0000 mg | ORAL_TABLET | Freq: Every day | ORAL | Status: DC
  Administered 2017-01-11 – 2017-01-13 (×3): 30 mg via ORAL
  Filled 2017-01-11 (×3): qty 1

## 2017-01-11 MED ORDER — METOPROLOL TARTRATE 25 MG PO TABS
25.0000 mg | ORAL_TABLET | Freq: Two times a day (BID) | ORAL | Status: DC
  Administered 2017-01-11 – 2017-01-13 (×5): 25 mg via ORAL
  Filled 2017-01-11 (×5): qty 1

## 2017-01-11 MED ORDER — OCTREOTIDE ACETATE 50 MCG/ML IJ SOLN
50.0000 ug | Freq: Three times a day (TID) | INTRAMUSCULAR | Status: DC | PRN
  Filled 2017-01-11: qty 1

## 2017-01-11 MED ORDER — ISOSORBIDE MONONITRATE ER 30 MG PO TB24: 30.0000 mg | ORAL_TABLET | Freq: Every day | ORAL | Status: DC

## 2017-01-11 MED ORDER — INSULIN ASPART 100 UNIT/ML ~~LOC~~ SOLN
0.0000 [IU] | Freq: Three times a day (TID) | SUBCUTANEOUS | Status: DC
  Administered 2017-01-11: 11 [IU] via SUBCUTANEOUS
  Administered 2017-01-12 (×2): 5 [IU] via SUBCUTANEOUS
  Administered 2017-01-12: 3 [IU] via SUBCUTANEOUS

## 2017-01-11 NOTE — Evaluation (Signed)
Physical Therapy Evaluation Patient Details Name: Charles [name] MRN: [id number] DOB: 12-31-1940 Today's Date: 01/11/2017   History of Present Illness  PAtient is a 76 y/o male with hx of CVA, DM, CHF, HLD, PAF, OSA on CPAP, chronic leg edema, recent transmet amp and venous stasis on diuretics, paroxysmal atrial fib (pt refused anticoagulation) who presented with weakness, lethargy, recent falls and loose stools. Developed chest pain with elevated troponin.  Clinical Impression  Patient presents with generalized weakness, deconditioning, impaired standing balance and overall mobility with recent decline in functional mobility and ability to care for self. Reports multiple falls at home, all after standing from toilet and attempting to walk back to chair. Requires assist recently for ADLs from wife. Today, pt requires mod A to stand from elevated surface and take a few steps but BLEs give out without warning. Would benefit from ST SNF to maximize independence and mobility prior to return home. Pt agrees. Will follow acutely.     Follow Up Recommendations SNF    Equipment Recommendations  Other (comment) (defer to next venue)    Recommendations for Other Services       Precautions / Restrictions Precautions Precautions: Fall Restrictions Weight Bearing Restrictions: No      Mobility  Bed Mobility Overal bed mobility: Needs Assistance Bed Mobility: Supine to Sit;Sit to Supine     Supine to sit: +2 for physical assistance;HOB elevated;Mod assist Sit to supine: Mod assist;HOB elevated   General bed mobility comments: Assist to elevate trunk to get to EOB.  Assist to bring LES into bed to return to supine.  Transfers Overall transfer level: Needs assistance Equipment used:  (back of recliner chair) Transfers: Sit to/from Stand Sit to Stand: Mod assist         General transfer comment: Assist to power to standing with use of body momentum. Reports some dizziness. Stood from  elevated bed height x2.   Ambulation/Gait Ambulation/Gait assistance: Min assist Ambulation Distance (Feet): 3 Feet Assistive device:  (back of recliner) Gait Pattern/deviations: Step-to pattern Gait velocity: decreased   General Gait Details: Able to side step along side bed with Min A for balance; Bil knee buckling without warning.  Stairs            Wheelchair Mobility    Modified Rankin (Stroke Patients Only)       Balance Overall balance assessment: Needs assistance Sitting-balance support: Feet supported;No upper extremity supported Sitting balance-Leahy Scale: Fair     Standing balance support: During functional activity;Bilateral upper extremity supported Standing balance-Leahy Scale: Poor Standing balance comment: Reliant on BUEs for support in standing.                             Pertinent Vitals/Pain Pain Assessment: No/denies pain    Home Living Family/patient expects to be discharged to:: Private residence Living Arrangements: Spouse/significant other Available Help at Discharge: Family;Available 24 hours/day Type of Home: House Home Access: Stairs to enter Entrance Stairs-Rails:  (grab bar on right) Entrance Stairs-Number of Steps: 2 Home Layout: Two level;Able to live on main level with bedroom/bathroom Home Equipment: Shower seat;Walker - standard;Cane - single point;Wheelchair - manual Additional Comments: w/c is too small.    Prior Function Level of Independence: Needs assistance   Gait / Transfers Assistance Needed: Has had a recent decline in mobility at home- using SPC to RW to w/c. Walking short distances with RW. Multiple falls every time after standing off the  toilet.  ADL's / Homemaking Assistance Needed: Wife assists with ADLs- bird baths.  Comments: Pt sleeps in elevated recliner. Not able to stand from low surfaces.     Hand Dominance   Dominant Hand: Right    Extremity/Trunk Assessment   Upper Extremity  Assessment Upper Extremity Assessment: Defer to OT evaluation    Lower Extremity Assessment Lower Extremity Assessment: Generalized weakness       Communication   Communication: HOH  Cognition Arousal/Alertness: Awake/alert Behavior During Therapy: WFL for tasks assessed/performed Overall Cognitive Status: Within Functional Limits for tasks assessed                      General Comments General comments (skin integrity, edema, etc.): Daughter present during session.    Exercises     Assessment/Plan    PT Assessment Patient needs continued PT services  PT Problem List Decreased strength;Decreased mobility;Obesity;Decreased activity tolerance;Decreased balance          PT Treatment Interventions Therapeutic activities;Gait training;Stair training;Balance training;Neuromuscular re-education;Therapeutic exercise;Patient/family education;Functional mobility training    PT Goals (Current goals can be found in the Care Plan section)  Acute Rehab PT Goals Patient Stated Goal: to be able to do for myself and walk to the bathroom PT Goal Formulation: With patient Time For Goal Achievement: 01/25/17 Potential to Achieve Goals: Fair    Frequency Min 2X/week   Barriers to discharge Decreased caregiver support;Inaccessible home environment      Co-evaluation               End of Session Equipment Utilized During Treatment: Gait belt Activity Tolerance: Patient tolerated treatment well;Patient limited by fatigue Patient left: in bed;with call bell/phone within reach;with family/visitor present;with bed alarm set Nurse Communication: Mobility status;Need for lift equipment (if getting into low chair)         Time: [id number] PT Time Calculation (min) (ACUTE ONLY): 44 min   Charges:   PT Evaluation $PT Eval Moderate Complexity: 1 Procedure PT Treatments $Therapeutic Activity: 23-37 mins   PT G Codes:        Jeri Jeanbaptiste A Bricia Taher 01/11/2017, 5:08  PM Wray Kearns, [place], DPT [phone]

## 2017-01-11 NOTE — ED Notes (Signed)
Admitting MD at bedside.

## 2017-01-11 NOTE — Consult Note (Signed)
Cardiology Consult    Patient ID: JEBEDIAH MAGNER MRN: [id number], DOB/AGE: May 16, 1941   Admit date: 01/10/2017 Date of Consult: 01/11/2017  Primary Physician: [name], MD Primary Cardiologist: Dr. Harrington Challenger Requesting Provider: Dr. Sloan Leiter Reason for Consultation: chest pain  Patient Profile    76 yo male with PMH of  CAD (MI ~2000 s/p PTCA of pLAD, NSTEMI 10/2012 with unsuccessful PCI, treated medically), hard of hearing, legally blind, cholelithiasis, CVA, DM c/b neuropathy, ED, HTN, h/o MRSA infection, h/o stomach ulcers, HLD, morbid obesity, OSA on CPAP, chronic leg edema and venous stasis on diuretics, paroxysmal atrial fib (pt refused anticoagulation) who presented with c/o weakness, lethargy, recent falls and loose stools. Developed chest pain with elevated troponin.   Past Medical History   Past Medical History:  Diagnosis Date  . Arthritis   . B12 deficiency   . CAD (coronary artery disease)    a. Approx. 2000 - MI. Cath: single vsl dz, PTCA dLAD/medical rx. ;  b. NSTEMI 11/13 => IVUS attempted for RCA but not successful; anatomy felt stable from 2000 => med Rx. c. [place] 08/2016: severe stable diffuse dLAD, severe prox RCA -> PCI of RCA unsuccessful, consider CABG for refractory angina.  . Cancer ([place])    skin cancer- head   . Cataracts   . Cholelithiasis   . Chronic diastolic CHF (congestive heart failure) ([place])   . Claustrophobia   . CVA (cerebral vascular accident) ([place]) ~ 2001   vision inparted from stroke.  Visual Memory loss  . Diabetic neuropathy ([place])   . Diverticulitis   . Dysrhythmia    if he does not take Metoprolol- Afib  . ED (erectile dysfunction)   . Essential hypertension   . History of MRSA infection    Right foot  . History of stomach ulcers   . HOH (hard of hearing)   . Hypercholesterolemia   . Legally blind   . Lower extremity edema   . Morbid obesity ([place])   . Neuropathy ([place])   . OSA on CPAP   . PAF (paroxysmal atrial fibrillation) ([name])     a. confirmed by event monitor. b. 02/2014 rash on Coumadin, patient decided to discontinue Xarelto due to possible rash, cost and lawyers ads on TV, agreed to take Plavix.  . Pneumonia 2016  . Tunnel vision    Since stroke  . Type II diabetes mellitus ([place])   . Venous stasis     Past Surgical History:  Procedure Laterality Date  . CARDIAC CATHETERIZATION  1990's  . CARDIAC CATHETERIZATION N/A 08/17/2016   Procedure: Left Heart Cath and Coronary Angiography;  Surgeon: Peter M Martinique, MD;  Location: [facility];  Service: Cardiovascular;  Laterality: N/A;  . CARDIAC CATHETERIZATION N/A 08/19/2016   Procedure: Coronary Balloon Angioplasty;  Surgeon: Peter M Martinique, MD;  Location: [facility];  Service: Cardiovascular;  Laterality: N/A;  . CEREBRAL ANGIOGRAM  ~ 2000  . COLONOSCOPY    . LEFT HEART CATHETERIZATION WITH CORONARY ANGIOGRAM N/A 10/27/2012   Procedure: LEFT HEART CATHETERIZATION WITH CORONARY ANGIOGRAM;  Surgeon: Burnell Blanks, MD;  Location: [facility] CATH LAB;  Service: Cardiovascular;  Laterality: N/A;  . TOE AMPUTATION  2006; 2009   "Dr. Blenda Mounts; big toe left foot; little toe on right foot" (10/26/2012)  . TOE AMPUTATION Right 12/24/2015   2nd & 3 toes/notes 1/13//2017  . TOE AMPUTATION Right    5TH TOE  . 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;  Service: Podiatry;  Laterality: Right;     Allergies  Allergies  Allergen Reactions  . Xarelto [Rivaroxaban] Rash    History of Present Illness    Mr. Tardo is a 76 yo male with extensive PMH who is followed by Dr. Harrington Challenger. He was last admitted in 9/17 with chest pain and underwent diagnostic LHC with unsuccessful attempted PCI to RCA due to severe Shepherd's crook deformity. Planned for medical management at that time, with consideration of CABG if he developed refractory limiting angina. Imdur was added for medical  therapy  2D echo 08/17/16: EF 55-60%, possible HK of the apical myocardium, grade 1 DD, high vent filling pressure, mildly dilated LA.  Reports he had right foot surgery for partial amputation back in 11/17 and had trouble with healing since that time. Last saw Dr. Harrington Challenger in 12/17 and reported being at his baseline.   Presented to the ED on 01/10/17 with progressive weakness, lethargy, recurrent falls and loose stools. Noted to be hypotensive, and hypoglycemic on arrival. Labs showed Trop 0.54>>0.39>>0.24>>0.18, stable electrolytes, Lactic Acid 1.8, TSH normal. CXR with mild vascular congestion and mild cardiomegaly. He was treated with IV fluids and admitted for further management. Reports he has not had any anginal episodes until this afternoon around 12pm. Became tachycardiac and had pressure in his chest with radiation into the right shoulder. Was given 2SL nitro with relief. Stated he had not been given his metoprolol or Imdur because of hypotension initially. EKG showed ST. Cardiology has been asked to evaluate.   Inpatient Medications    . aspirin EC  81 mg Oral Daily  . cholecalciferol  1,000 Units Oral Daily  . clopidogrel  75 mg Oral Daily  . enoxaparin (LOVENOX) injection  80 mg Subcutaneous Q24H  . [START ON 01/12/2017] isosorbide mononitrate  30 mg Oral Daily  . metoprolol tartrate  25 mg Oral BID  . mometasone-formoterol  2 puff Inhalation BID  . pravastatin  40 mg Oral q1800  . sodium chloride flush  3 mL Intravenous Q12H  . vitamin B-12  1,000 mcg Oral Daily    Family History    Family History  Problem Relation Age of Onset  . Breast cancer Mother 42  . Heart disease Father 18  . Asthma Neg Hx     Social History    Social History   Social History  . Marital status: Married    Spouse name: N/A  . Number of children: N/A  . Years of education: N/A   Occupational History  . Not on file.   Social History Main Topics  . Smoking status: Former Smoker    Packs/day:  1.00    Years: 30.00    Types: Cigarettes, Cigars  . Smokeless tobacco: Former Systems developer    Quit date: 08/15/2016     Comment: 10/26/2012 "quit smoking cigarettes  in ~ 1996; wife smokes in the house"  . Alcohol use Yes     Comment: 10/26/2012 "have a drink maybe twice/yr"  . Drug use: No  . Sexual activity: Yes   Other Topics Concern  . Not on file   Social History Narrative   Disabled s/p CVA     Review of Systems    General:  No chills, fever, night sweats or weight changes.  Cardiovascular: See HPI Dermatological: No rash, lesions/masses Respiratory: No cough, dyspnea Urologic: No hematuria, dysuria Abdominal:   No nausea, vomiting, ++ diarrhea, bright red blood  per rectum, melena, or hematemesis Neurologic:  No visual changes, wkns, changes in mental status. All other systems reviewed and are otherwise negative except as noted above.  Physical Exam    Blood pressure 131/67, pulse 106, temperature 98.7 F (37.1 C), temperature source Oral, resp. rate 16, height 6\' 4"  (1.93 m), weight (!) 360 lb (163.3 kg), SpO2 100 %.  General: Pleasant morbidly obese male, NAD Psych: Normal affect. Neuro: Alert and oriented X 3. Moves all extremities spontaneously. HEENT: Normal  Neck: Supple without bruits or JVD. Lungs:  Resp regular and unlabored, CTA. Heart: tachy no s3, s4, or murmurs. Abdomen: Soft, non-tender, non-distended, BS + x 4.  Extremities: No clubbing, cyanosis, chronic bilateral LE edema. Partial foot amp on right side.  Labs    Troponin (Point of Care Test) No results for input(s): TROPIPOC in the last 72 hours.  Recent Labs  01/10/17 1628 01/10/17 2230 01/11/17 0358 01/11/17 1033  TROPONINI 0.54* 0.39* 0.24* 0.18*   Lab Results  Component Value Date   WBC 11.4 (H) 01/11/2017   HGB 10.9 (L) 01/11/2017   HCT 33.8 (L) 01/11/2017   MCV 91.4 01/11/2017   PLT 207 01/11/2017    Recent Labs Lab 01/11/17 0358  NA 136  K 4.4  CL 103  CO2 26  BUN 23*    CREATININE 0.87  CALCIUM 8.6*  PROT 5.5*  BILITOT 0.4  ALKPHOS 48  ALT 15*  AST 24  GLUCOSE 112*   Lab Results  Component Value Date   CHOL 98 08/16/2016   HDL 33 (L) 08/16/2016   LDLCALC 55 08/16/2016   TRIG 49 08/16/2016   No results found for: [facility]   Radiology Studies    Dg Chest 2 View  Result Date: 01/10/2017 CLINICAL DATA:  Acute onset of worsening generalized weakness. Initial encounter. EXAM: CHEST  2 VIEW COMPARISON:  Chest radiograph performed 10/17/2016 FINDINGS: The lungs are well-aerated. Mild vascular congestion is suggested. Mild left basilar atelectasis is seen. There is no evidence of pleural effusion or pneumothorax. The heart is mildly enlarged. No acute osseous abnormalities are seen. IMPRESSION: Mild vascular congestion and mild cardiomegaly. Mild left basilar atelectasis noted. Lungs otherwise clear. Electronically Signed   By: Garald Balding M.D.   On: 01/10/2017 17:26    ECG & Cardiac Imaging    EKG: ST  Echo: 9/17  Study Conclusions  - Left ventricle: The cavity size was normal. Systolic function was   normal. The estimated ejection fraction was in the range of 55%   to 60%. Possible hypokinesis of the apical myocardium. Doppler   parameters are consistent with abnormal left ventricular   relaxation (grade 1 diastolic dysfunction). Doppler parameters   are consistent with high ventricular filling pressure. - Mitral valve: Calcified annulus. - Left atrium: The atrium was mildly dilated.  Impressions:  - Technically difficult; definity used; probable mild apical   hypokinesis with overall preserved LV function; no apical   thrombus; grade 1 diastolic dysfunction with elevated LV filling   pressure; mild LAE.  Cath: 9/17  Diagnostic Diagram      Conclusion     Prox RCA lesion, 90 %stenosed.  Post intervention, there is a 90% residual stenosis.   Unsuccessful PCI of the proximal RCA due to inability to cross with stent or  adequate sized balloon. 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  Plan: aggressive medical therapy. If patient has refractory limiting angina  would need to consider for CABG.     Assessment & Plan    76 yo male with PMH of  CAD (MI ~2000 s/p PTCA of pLAD, NSTEMI 10/2012 with unsuccessful PCI, treated medically), hard of hearing, legally blind, cholelithiasis, CVA, DM c/b neuropathy, ED, HTN, h/o MRSA infection, h/o stomach ulcers, HLD, morbid obesity, OSA on CPAP, chronic leg edema and venous stasis on diuretics, paroxysmal atrial fib (pt refused anticoagulation) who presented with c/o weakness, lethargy, recent falls and loose stools. Developed chest pain with elevated troponin.   1. Chest pain: Reports he developed a sudden onset of chest pressure and tachycardia this afternoon around 12pm. Had not received his metoprolol or Imdur as he was hypotensive on admission. Blood pressures have now improved. Noted to be tachycardiac, but had received a dose of metoprolol prior to assessment.  -- Trop with a downward trend, likely demand ischemia in the setting of hypotension. He has known disease in the RCA and LAD territory with unsuccessful PCI attempts in the past.  -- Will resume Imdur at this time and assess if these relieves anginal symptoms as his blood pressure is now stable. Telemetry shows short runs of ST, metoprolol has been resumed.  -- continue ASA, plavix, BB, statin for medical therapy  2. Diarrhea: Reports 3-4 days prior to admission with poor PO intake. Management per primary  3. Chronic diastolic HF: Does not appear volume overloaded at this time. Given IV fluids on admission. Monitor volume status, as he was on home lasix prior to arrival.   Barnet Pall, NP-C Pager [phone] 01/11/2017, 1:10 PM

## 2017-01-11 NOTE — ED Notes (Signed)
Cardiology at bedside.

## 2017-01-11 NOTE — ED Notes (Signed)
Attempted report to floor x2. 

## 2017-01-11 NOTE — ED Notes (Signed)
Attempted report x1. 

## 2017-01-11 NOTE — ED Notes (Signed)
Family at bedside. 

## 2017-01-11 NOTE — Progress Notes (Addendum)
PROGRESS NOTE        PATIENT DETAILS Name: Charles [name] Age: 75 y.o. Sex: male Date of Birth: May 12, 1941 Admit Date: 01/10/2017 Admitting Physician Vianne Bulls, MD GFQ:[id number] Plotnikov, MD  Brief Narrative: Patient is a 76 y.o. male with history of CAD, CVA, chronic diastolic heart failure, paroxysmal atrial fibrillation not on anticoagulation, insulin-dependent type 2 diabetes who presented to the hospital for several days history of progressive weakness, lethargy, recurrent falls and loose stools. In the emergency room, patient was found to be hypotensive, hypoglycemic-and admitted to the hospitalist service for further evaluation and treatment. See below for further details  Subjective: No diarrhea or loose stools since admission. Feels better. Looks weak and deconditioned.  Assessment/Plan: Hypoglycemia: Continue to hold all oral hypoglycemics and insulin agents. CBGs now improving, slowly titrate off the D5 infusion. Check A1c. Given the recurrent hypoglycemia-would avoid strict glycemic control in this patient and allow some mild permissive hyperglycemia.  Hypotension: Probably secondary to hypovolemia due to poor oral intake, antihypertensive diffuse and probable diarrhea. Blood pressure improved after IV fluid resuscitation. Random cortisol appropriate at 16. No obvious foci of infection evident-but await cultures. Continue to monitor off antimicrobial therapy  Diarrhea: Seems to have resolved. Per patient he had one or 2 loose stools per day prior to this admission-doubt he had significant diarrheal illness. Doubt any indication to check stool studies at this time. Plans are to follow.  Elevated troponins: No anginal symptoms-I suspect this is secondary to hypotension/plan ischemia. EKG is nonacute. Thankfully troponin trend is flat and not consistent with ACS. Check echo, has known history of CAD-plans are to continue with aspirin, Plavix,  statin.  Addendum: 12 PM-patient now starting to have pressure-like sensation across his chest-resume beta blocker and Imdur. Have consulted cardiology  Known history of CAD: Outpatient cardiology note on 08/30/16 reviewed-he does not have any PCI options for revascularization-if revascularization needed he apparently needs surgery. On antiplatelet agents and statin-we will slowly resume Imdur and beta blocker if his blood pressure allows. Awaiting echocardiogram  Chronic diastolic heart failure: Clinically Compensated, hold antihypertensives/diuretics for today-reassess tomorrow. Follow weights.  ZXY:OFVW controlled - not on anticoag by his own choice-currently on dual antiplatelet agents  S/p trans-met of R foot: Amputation site looks clean without any discharge.  Morbid Obesity  Generalized weakness/deconditioning: Multifactorial etiology-suspect chronic debility at baseline-unfortunately was mostly bedbound after recent transmetatarsal amputation of right foot. Obtain PT evaluation  DVT Prophylaxis: Prophylactic Lovenox   Code Status: Full code  Family Communication: Family member at bedside  Disposition Plan: Remain inpatient-but will plan on Home health vs SNF on discharge-in next 1-2 days  Antimicrobial agents: Anti-infectives    None      Procedures: None  CONSULTS:  None  Time spent: 25 minutes-Greater than 50% of this time was spent in counseling, explanation of diagnosis, planning of further management, and coordination of care.  MEDICATIONS: Scheduled Meds: . aspirin EC  81 mg Oral Daily  . cholecalciferol  1,000 Units Oral Daily  . clopidogrel  75 mg Oral Daily  . enoxaparin (LOVENOX) injection  80 mg Subcutaneous Q24H  . [START ON 01/12/2017] isosorbide mononitrate  30 mg Oral Daily  . mometasone-formoterol  2 puff Inhalation BID  . pravastatin  40 mg Oral q1800  . sodium chloride flush  3 mL Intravenous Q12H  . vitamin B-12  1,000 mcg Oral Daily    Continuous Infusions: PRN Meds:.acetaminophen **OR** acetaminophen, gabapentin, HYDROcodone-acetaminophen, nitroGLYCERIN, octreotide, ondansetron **OR** ondansetron (ZOFRAN) IV   PHYSICAL EXAM: Vital signs: Vitals:   01/11/17 0733 01/11/17 0750 01/11/17 0845 01/11/17 0900  BP: 109/63 120/59 (!) 100/34 (!) 98/54  Pulse: 86 89 74 75  Resp: '18 16 16 25  ' Temp:      TempSrc:      SpO2: 99% 98% 97% 99%  Weight:      Height:       Filed Weights   01/10/17 1610  Weight: (!) 163.3 kg (360 lb)   Body mass index is 43.82 kg/m.   General appearance :Awake, alert, not in any distress. Speech Clear. Eyes:, pupils equally reactive to light and accomodation,no scleral icterus.Pink conjunctiva HEENT: Atraumatic and Normocephalic Neck: supple, no JVD. No cervical lymphadenopathy. No thyromegaly Resp:Good air entry bilaterally, no added sounds  CVS: S1 S2 regular, no murmurs.  GI: Bowel sounds present, Non tender and not distended with no gaurding, rigidity or rebound.No organomegaly Extremities: B/L Lower Ext shows no edema, both legs are warm to touch Neurology:  speech clear,Non focalExam-strength 5/5 in lower extremities, sensation is grossly intact. Psychiatric: Normal judgment and insight. Alert and oriented x 3. Normal mood. Musculoskeletal:No digital cyanosis Skin:No Rash, warm and dry Wounds:N/A  I have personally reviewed following labs and imaging studies  LABORATORY DATA: CBC:  Recent Labs Lab 01/10/17 1628 01/11/17 0358  WBC 18.2* 11.4*  NEUTROABS 15.6*  --   HGB 11.5* 10.9*  HCT 35.3* 33.8*  MCV 91.9 91.4  PLT 228 149    Basic Metabolic Panel:  Recent Labs Lab 01/10/17 1628 01/11/17 0358  NA 133* 136  K 4.5 4.4  CL 100* 103  CO2 24 26  GLUCOSE 41* 112*  BUN 29* 23*  CREATININE 1.19 0.87  CALCIUM 8.7* 8.6*    GFR: Estimated Creatinine Clearance: 121.8 mL/min (by C-G formula based on SCr of 0.87 mg/dL).  Liver Function Tests:  Recent Labs Lab  01/10/17 1628 01/11/17 0358  AST 36 24  ALT 13* 15*  ALKPHOS 50 48  BILITOT 1.3* 0.4  PROT 5.6* 5.5*  ALBUMIN 2.8* 2.5*   No results for input(s): LIPASE, AMYLASE in the last 168 hours. No results for input(s): AMMONIA in the last 168 hours.  Coagulation Profile: No results for input(s): INR, PROTIME in the last 168 hours.  Cardiac Enzymes:  Recent Labs Lab 01/10/17 1628 01/10/17 2230 01/11/17 0358  TROPONINI 0.54* 0.39* 0.24*    BNP (last 3 results) No results for input(s): PROBNP in the last 8760 hours.  HbA1C: No results for input(s): HGBA1C in the last 72 hours.  CBG:  Recent Labs Lab 01/11/17 0005 01/11/17 0222 01/11/17 0425 01/11/17 0605 01/11/17 0948  GLUCAP 75 68 116* 136* 207*    Lipid Profile: No results for input(s): CHOL, HDL, LDLCALC, TRIG, CHOLHDL, LDLDIRECT in the last 72 hours.  Thyroid Function Tests:  Recent Labs  01/10/17 2230  TSH 1.205    Anemia Panel: No results for input(s): VITAMINB12, FOLATE, FERRITIN, TIBC, IRON, RETICCTPCT in the last 72 hours.  Urine analysis:    Component Value Date/Time   COLORURINE YELLOW 01/10/2017 [place] 01/10/2017 1639   LABSPEC 1.013 01/10/2017 [place] 5.0 01/10/2017 [place] 01/10/2017 [place] 08/28/2010 0924   HGBUR NEGATIVE 01/10/2017 [place] 01/10/2017 [place] 01/10/2017 1639  PROTEINUR NEGATIVE 01/10/2017 1639   UROBILINOGEN 1.0 07/30/2014 1827   NITRITE NEGATIVE 01/10/2017 1639   LEUKOCYTESUR TRACE (A) 01/10/2017 1639    Sepsis Labs: Lactic Acid, Venous    Component Value Date/Time   LATICACIDVEN 1.80 01/10/2017 1642    MICROBIOLOGY: No results found for this or any previous visit (from the past 240 hour(s)).  RADIOLOGY STUDIES/RESULTS: Dg Chest 2 View  Result Date: 01/10/2017 CLINICAL DATA:  Acute onset of worsening generalized weakness. Initial encounter. EXAM: CHEST  2  VIEW COMPARISON:  Chest radiograph performed 10/17/2016 FINDINGS: The lungs are well-aerated. Mild vascular congestion is suggested. Mild left basilar atelectasis is seen. There is no evidence of pleural effusion or pneumothorax. The heart is mildly enlarged. No acute osseous abnormalities are seen. IMPRESSION: Mild vascular congestion and mild cardiomegaly. Mild left basilar atelectasis noted. Lungs otherwise clear. Electronically Signed   By: Garald Balding M.D.   On: 01/10/2017 17:26     LOS: 1 day   Oren Binet, MD  [facility] Pager:[phone]  If 7PM-7AM, please contact night-coverage [email or web address] Password [email or web address] 01/11/2017, 11:03 AM

## 2017-01-11 NOTE — ED Notes (Signed)
Pt beginning to become more anxious, sts " there's something wrong with my heart, I need to see the heart doctor and I need a nitro." -- Jerene Pitch, RN made aware

## 2017-01-11 NOTE — ED Notes (Signed)
Pt called out stating "I am not feeling right, I need my metoprolol." Pt reported chest pressure. EKG showing Sinus Tach. Paged Dr. Lovena Neighbours, will place order for metoprolol.

## 2017-01-11 NOTE — ED Notes (Signed)
Attempted report to floor x 1 

## 2017-01-11 NOTE — ED Notes (Signed)
Pt given meal tray.

## 2017-01-12 ENCOUNTER — Inpatient Hospital Stay (HOSPITAL_COMMUNITY): Payer: Medicare Other

## 2017-01-12 DIAGNOSIS — I5030 Unspecified diastolic (congestive) heart failure: Secondary | ICD-10-CM

## 2017-01-12 DIAGNOSIS — I480 Paroxysmal atrial fibrillation: Secondary | ICD-10-CM

## 2017-01-12 DIAGNOSIS — R748 Abnormal levels of other serum enzymes: Secondary | ICD-10-CM

## 2017-01-12 LAB — BASIC METABOLIC PANEL
Anion gap: 9 (ref 5–15)
BUN: 26 mg/dL — AB (ref 4–21)
BUN: 26 mg/dL — AB (ref 6–20)
CALCIUM: 9 mg/dL (ref 8.9–10.3)
CHLORIDE: 101 mmol/L (ref 101–111)
CO2: 23 mmol/L (ref 22–32)
CREATININE: 0.9 mg/dL (ref 0.6–1.3)
CREATININE: 0.94 mg/dL (ref 0.61–1.24)
GFR calc non Af Amer: >60 mL/min (ref >60)
Glucose, Bld: 194 mg/dL — ABNORMAL HIGH (ref 65–99)
Glucose: 194 mg/dL
Potassium: 4.6 mmol/L (ref 3.5–5.1)
SODIUM: 133 mmol/L — AB (ref 137–147)
SODIUM: 133 mmol/L — AB (ref 135–145)

## 2017-01-12 LAB — CBC
HCT: 38.4 % — ABNORMAL LOW (ref 39.0–52.0)
HEMOGLOBIN: 12.3 g/dL — AB (ref 13.0–17.0)
MCH: 29.9 pg (ref 26.0–34.0)
MCHC: 32 g/dL (ref 30.0–36.0)
MCV: 93.2 fL (ref 78.0–100.0)
PLATELETS: 247 10*3/uL (ref 150–400)
RBC: 4.12 MIL/uL — ABNORMAL LOW (ref 4.22–5.81)
RDW: 15.7 % — AB (ref 11.5–15.5)
WBC: 12.6 10*3/uL — ABNORMAL HIGH (ref 4.0–10.5)

## 2017-01-12 LAB — GLUCOSE, CAPILLARY
GLUCOSE-CAPILLARY: 304 mg/dL — AB (ref 65–99)
GLUCOSE-CAPILLARY: 167 mg/dL — AB (ref 65–99)
Glucose-Capillary: 215 mg/dL — ABNORMAL HIGH (ref 65–99)
Glucose-Capillary: 175 mg/dL — ABNORMAL HIGH (ref 65–99)
Glucose-Capillary: 249 mg/dL — ABNORMAL HIGH (ref 65–99)
Glucose-Capillary: 174 mg/dL — ABNORMAL HIGH (ref 65–99)

## 2017-01-12 LAB — CBC AND DIFFERENTIAL: WBC: 12.6 10^3/mL

## 2017-01-12 LAB — HEMOGLOBIN A1C
HEMOGLOBIN A1C: 5.6 % (ref 4.8–5.6)
MEAN PLASMA GLUCOSE: 114 mg/dL

## 2017-01-12 LAB — ECHOCARDIOGRAM COMPLETE
HEIGHTINCHES: 76 in
WEIGHTICAEL: 4633.6 [oz_av]

## 2017-01-12 MED ORDER — INSULIN ASPART 100 UNIT/ML ~~LOC~~ SOLN
5.0000 [IU] | Freq: Once | SUBCUTANEOUS | Status: AC
  Administered 2017-01-12: 5 [IU] via SUBCUTANEOUS

## 2017-01-12 MED ORDER — INSULIN GLARGINE 100 UNIT/ML ~~LOC~~ SOLN
12.0000 [IU] | Freq: Every day | SUBCUTANEOUS | Status: DC
  Administered 2017-01-12: 12 [IU] via SUBCUTANEOUS
  Filled 2017-01-12: qty 0.12

## 2017-01-12 MED ORDER — PIOGLITAZONE HCL 45 MG PO TABS
45.0000 mg | ORAL_TABLET | Freq: Every day | ORAL | Status: DC
  Administered 2017-01-12 – 2017-01-13 (×2): 45 mg via ORAL
  Filled 2017-01-12 (×2): qty 1

## 2017-01-12 MED ORDER — INSULIN ASPART 100 UNIT/ML ~~LOC~~ SOLN
0.0000 [IU] | Freq: Three times a day (TID) | SUBCUTANEOUS | Status: DC
  Administered 2017-01-13: 3 [IU] via SUBCUTANEOUS
  Administered 2017-01-13: 7 [IU] via SUBCUTANEOUS

## 2017-01-12 MED ORDER — INSULIN ASPART 100 UNIT/ML ~~LOC~~ SOLN: 0.0000 [IU] | Freq: Every day | SUBCUTANEOUS | Status: DC

## 2017-01-12 MED ORDER — PERFLUTREN LIPID MICROSPHERE
1.0000 mL | INTRAVENOUS | Status: DC | PRN
  Administered 2017-01-12: 2 mL via INTRAVENOUS
  Filled 2017-01-12: qty 10

## 2017-01-12 NOTE — Progress Notes (Addendum)
PROGRESS NOTE        PATIENT DETAILS Name: Charles [name] Age: 76 y.o. Sex: male Date of Birth: 06/20/1941 Admit Date: 01/10/2017 Admitting Physician Vianne Bulls, MD [id number] Plotnikov, MD  Brief Narrative: Patient is a 76 y.o. male with h/o CAD, CVA, chronic diastolic Hf, paroxysmal atrial fibrillation not on anticoagulation, and T2DM insulin dependent. He presented to the ED with several day hx of progressive weakness, lethargy, recurrent falls and loos stools. He was found to be hypotensive and hypoglycemic. He was admitted for further management, see below.    Subjective: Patient is very agitated this morning due to frustrations around DM med changes and that his antihypertensives were held on admission  He does not understand changes were made in order to avoid another dangerous hypoglycemic event.   He reports no diarrhea at this time. RN reports BM with soft/regular consistency stools.  Assessment/Plan: Hypoglycemia associated with diabetes: This is a recurrent issue  (similar admission in November 2017) -likely due to strict glycemic control-his A1c is 5.6, not sure if patient has reliable oral intake. Given recurrent hypoglycemia would allow mild permissive hyperglycemia to prevent life-threatening episodes of hypoglycemia (CBG down to 34!). CBGs are more stable-plans are to continue with SSI, start low-dose Lantus and resume Actos. Would probably not resume Amaryl on discharge as it is prone to cause hypoglycemia. Patient was agitated/angry about changes in his usual diabetic regimen-above issue was explained-I doubt he understands. I subsequently spoke to his wife over the phone-who is a nurse-and explained the rationale.  Hypotension: Due to secondary hypovolemia from poor oral intake, antihypertensive use and probable diarrhea prior to admission. Antihypertensives were initially held, once BP was felt to be stable-this has been resumed. Tried  explaining to patient that antihypertensives were RIGHTLY held on admission as he was hypotensive- I do not think he understood-remained agitated. I subsequently spoke to his wife over the phone-who is a nurse-and explained the rationale.  Elevated troponin: Probably demands ischemia in the setting of hypotension (initial blood pressure on presentation was 80/43, and lowest BP was 71/40 while in the emergency room). Troponin trend is flat and not consistent with ACS. 2-D echocardiogram showed EF around 60-65% without any regional wall motion abnormalities.  Diarrhea: No diarrhea since admission-per RN-stools soft today. Since no loose stools or diarrhea-no rationale to check stool studies. May have had a transient viral illness that caused him to have diarrhea at home.  Leukocytosis: Has down trended without any antimicrobial treatment. No obvious infective foci evident. Chest x-ray/UA negative. Blood cultures negative so far. He looks much better today-continue to monitor off antimicrobial therapy.  Type 2 diabetes, uncontrolled, with retinopathy ([place]): See above.   PAF (paroxysmal atrial fibrillation): On metoprolol for rate control-not on anticoagulation by patient's choice. EKG 01/10/17 does not show atrial fibrillation.CHA2DS2-Vas score of at least 4.   Chronic Diastolic HF: Remains compensated-Lasix remains on hold-BP soft-on metoprolol and Imdur. Continue to follow weights   COPD: No exacerbation during this admission so far. 96% O2 sat RA. Continue Dulera.        CAD (coronary artery disease): Continue daily ASA, Plavix and statin. Continue heart healthy diet.     Dyslipidemia: Continue statin  History of CVA: Continue daily ASA and Plavix regimen.   Gen Deconditioning: Suspect multifactorial-recent diarrheal illness, hypoglycemia, hypotension, dehydration or contributing. 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 Seen by physical therapy, plans are for SNF on discharge  DVT Prophylaxis: Prophylactic Lovenox  Code Status: Full code  Family Communication: Spouse over the phone  Disposition Plan: Remain inpatient  Antimicrobial agents: Anti-infectives    None     Procedures: Echocardiogram 1/31>> EF 123456, grade 2 diastolic dysfunction  CONSULTS: Cardiology   Time spent: 30 minutes-Greater than 50% of this time was spent in counseling, explanation of diagnosis, planning of further management, and coordination of care.  MEDICATIONS: Scheduled Meds: . aspirin EC  81 mg Oral Daily  . cholecalciferol  1,000 Units Oral Daily  . clopidogrel  75 mg Oral Daily  . enoxaparin (LOVENOX) injection  80 mg Subcutaneous Q24H  . insulin aspart  0-15 Units Subcutaneous TID WC  . insulin glargine  12 Units Subcutaneous Daily  . isosorbide mononitrate  30 mg Oral Daily  . metoprolol tartrate  25 mg Oral BID  . mometasone-formoterol  2 puff Inhalation BID  . pioglitazone  45 mg Oral Daily  . pravastatin  40 mg Oral q1800  . sodium chloride flush  3 mL Intravenous Q12H  . vitamin B-12  1,000 mcg Oral Daily   Continuous Infusions: PRN Meds:.acetaminophen **OR** acetaminophen, gabapentin, HYDROcodone-acetaminophen, nitroGLYCERIN, ondansetron **OR** ondansetron (ZOFRAN) IV, perflutren lipid microspheres (DEFINITY) IV suspension   PHYSICAL EXAM: Vital signs: Vitals:   01/11/17 2209 01/12/17 0404 01/12/17 0749 01/12/17 1037  BP: 122/60 (!) 102/53  (!) 111/46  Pulse: (!) 102 80  93  Resp: (!) 23 19    Temp:  98.3 F (36.8 C)    TempSrc:  Oral    SpO2: 94% 97% 96%   Weight:  131.4 kg (289 lb 9.6 oz)    Height:       Filed Weights   01/10/17 1610 01/12/17 0404  Weight: (!) 163.3 kg (360 lb) 131.4 kg (289 lb 9.6 oz)   Body mass index is 35.25 kg/m.   General appearance: Awake, alert, not in any distress. Speech Clear. Not toxic Looking Eyes:  PERRLA,no scleral icterus. Pink conjunctiva HEENT: Atraumatic and Normocephalic Neck: Supple, no JVD. No cervical lymphadenopathy. No thyromegaly Resp: Good air entry bilaterally, no added sounds  CVS: S1 S2 regular, no murmurs.  GI: Bowel sounds present, Non tender and not distended with no gaurding, rigidity or rebound.No organomegaly Extremities: B/L Lower Ext shows no edema, both legs are warm to touch.  Neurology:  speech clear,Non focal, sensation is grossly intact. Psychiatric: Normal judgment and insight. AAO x 3. Frustrated mood. Musculoskeletal:No digital cyanosis Skin:No Rash, warm and dry Wounds:N/A  I have personally reviewed following labs and imaging studies  LABORATORY DATA: CBC:  Recent Labs Lab 01/10/17 1628 01/11/17 0358 01/12/17 0901  WBC 18.2* 11.4* 12.6*  NEUTROABS 15.6*  --   --   HGB 11.5* 10.9* 12.3*  HCT 35.3* 33.8* 38.4*  MCV 91.9 91.4 93.2  PLT 228 207 [id number]    Basic Metabolic Panel:  Recent Labs Lab 01/10/17 1628 01/11/17 0358 01/12/17 0901  NA 133* 136 133*  K 4.5 4.4 4.6  CL 100* 103 101  CO2 24 26 23   GLUCOSE 41* 112* 194*  BUN 29* 23* 26*  CREATININE 1.19 0.87 0.94  CALCIUM 8.7* 8.6* 9.0    GFR: Estimated Creatinine Clearance: 100.5 mL/min (by C-G formula based on SCr of 0.94 mg/dL).  Liver Function Tests:  Recent Labs Lab 01/10/17 1628 01/11/17  0358  AST 36 24  ALT 13* 15*  ALKPHOS 50 48  BILITOT 1.3* 0.4  PROT 5.6* 5.5*  ALBUMIN 2.8* 2.5*   No results for input(s): LIPASE, AMYLASE in the last 168 hours. No results for input(s): AMMONIA in the last 168 hours.  Coagulation Profile: No results for input(s): INR, PROTIME in the last 168 hours.  Cardiac Enzymes:  Recent Labs Lab 01/10/17 1628 01/10/17 2230 01/11/17 0358 01/11/17 1033  TROPONINI 0.54* 0.39* 0.24* 0.18*    BNP (last 3 results) No results for input(s): PROBNP in the last 8760 hours.  HbA1C:  Recent Labs  01/11/17 1126  HGBA1C 5.6     CBG:  Recent Labs Lab 01/11/17 0948 01/11/17 1154 01/11/17 1626 01/12/17 0116 01/12/17 0732  GLUCAP 207* 317* 330* 304* 167*    Lipid Profile: No results for input(s): CHOL, HDL, LDLCALC, TRIG, CHOLHDL, LDLDIRECT in the last 72 hours.  Thyroid Function Tests:  Recent Labs  01/10/17 2230  TSH 1.205    Anemia Panel: No results for input(s): VITAMINB12, FOLATE, FERRITIN, TIBC, IRON, RETICCTPCT in the last 72 hours.  Urine analysis:    Component Value Date/Time   COLORURINE YELLOW 01/10/2017 1639   APPEARANCEUR CLEAR 01/10/2017 1639   LABSPEC 1.013 01/10/2017 1639   PHURINE 5.0 01/10/2017 1639   GLUCOSEU NEGATIVE 01/10/2017 1639   GLUCOSEU NEGATIVE 08/28/2010 0924   HGBUR NEGATIVE 01/10/2017 1639   BILIRUBINUR NEGATIVE 01/10/2017 1639   KETONESUR NEGATIVE 01/10/2017 1639   PROTEINUR NEGATIVE 01/10/2017 1639   UROBILINOGEN 1.0 07/30/2014 1827   NITRITE NEGATIVE 01/10/2017 1639   LEUKOCYTESUR TRACE (A) 01/10/2017 1639    Sepsis Labs: Lactic Acid, Venous    Component Value Date/Time   LATICACIDVEN 1.80 01/10/2017 1642    MICROBIOLOGY: Recent Results (from the past 240 hour(s))  Culture, blood (routine x 2)     Status: None (Preliminary result)   Collection Time: 01/10/17  8:16 PM  Result Value Ref Range Status   Specimen Description BLOOD LEFT HAND  Final   Special Requests IN PEDIATRIC BOTTLE 1CC  Final   Culture NO GROWTH < 24 HOURS  Final   Report Status PENDING  Incomplete  Culture, blood (routine x 2)     Status: None (Preliminary result)   Collection Time: 01/10/17  8:21 PM  Result Value Ref Range Status   Specimen Description BLOOD RIGHT HAND  Final   Special Requests IN PEDIATRIC BOTTLE 4CC  Final   Culture NO GROWTH < 24 HOURS  Final   Report Status PENDING  Incomplete    RADIOLOGY STUDIES/RESULTS: Dg Chest 2 View  Result Date: 01/10/2017 CLINICAL DATA:  Acute onset of worsening generalized weakness. Initial encounter. EXAM: CHEST  2  VIEW COMPARISON:  Chest radiograph performed 10/17/2016 FINDINGS: The lungs are well-aerated. Mild vascular congestion is suggested. Mild left basilar atelectasis is seen. There is no evidence of pleural effusion or pneumothorax. The heart is mildly enlarged. No acute osseous abnormalities are seen. IMPRESSION: Mild vascular congestion and mild cardiomegaly. Mild left basilar atelectasis noted. Lungs otherwise clear. Electronically Signed   By: Garald Balding M.D.   On: 01/10/2017 17:26     LOS: 2 days   Rose Clousing, PAS  [employer]  If 7PM-7AM, please contact night-coverage [email or web address] Password [facility] 01/12/2017, 10:56 AM  Attending MD note Patient was seen, examined,treatment plan was discussed with the PA-S.  I have personally reviewed the clinical findings, lab, imaging studies and management of this patient in detail. I agree with  the documentation, as recorded by the PA-S.   Patient agitated about changes to his medications. He did not seem to understand when I tried to explain that he was severely hypoglycemic and hypotensive when evaluated by the admitting M.D. and hence his oral antihypertensives and oral hypoglycemic agents were held.  On Exam: Gen. exam: Awake, alert, not in any distress Chest: Good air entry bilaterally, no rhonchi or rales CVS: S1-S2 regular, no murmurs Abdomen: Soft, nontender and nondistended Neurology: Non-focal Skin: No rash or lesions  Impression Hypoglycemia-avoid strict glycemic control-avoid sulfonylureas Hypertension-suspect secondary to hypovolemia, antihypertensives use, and diuretics, and diarrhea Elevated troponin secondary to demand ischemia Resolving leukocytosis ? Viral illness causing diarrhea prior to admission  Plan: Resume Actos, start low-dose Lantus-continue SSI-slowly try to optimize glycemic regimen-we will need to allow mild permissive hyperglycemia to avoid life-threatening hypoglycemic episodes. Continue metoprolol and  Imdur were-Lasix currently on hold as blood pressure remains soft and stable Generalized deconditioning-needs placement on discharge If clinical improvement continues-suspect SNF on 2/1  Spoke at length the patient's spouse over the phone who was a nurse-again tried explaining the rationale for adjustment of his antihypertensive/glycemic regimen on admission. Family was fixated on on why Imdur and metoprolol were held on admission-tried explaining that blood pressure was in the 123456 systolic on admission.Plan for SNF on 2/1 also discussed   Rest as above  [facility]

## 2017-01-12 NOTE — Progress Notes (Signed)
Progress Note  Patient Name: Charles [name] Date of Encounter: 01/12/2017  Primary Cardiologist: Dr. Harrington Challenger  Subjective   No further episodes of chest pain. Breathing is good.   Inpatient Medications    Scheduled Meds: . aspirin EC  81 mg Oral Daily  . cholecalciferol  1,000 Units Oral Daily  . clopidogrel  75 mg Oral Daily  . enoxaparin (LOVENOX) injection  80 mg Subcutaneous Q24H  . insulin aspart  0-15 Units Subcutaneous TID WC  . insulin glargine  12 Units Subcutaneous Daily  . isosorbide mononitrate  30 mg Oral Daily  . metoprolol tartrate  25 mg Oral BID  . mometasone-formoterol  2 puff Inhalation BID  . pioglitazone  45 mg Oral Daily  . pravastatin  40 mg Oral q1800  . sodium chloride flush  3 mL Intravenous Q12H  . vitamin B-12  1,000 mcg Oral Daily   Continuous Infusions:  PRN Meds: acetaminophen **OR** acetaminophen, gabapentin, HYDROcodone-acetaminophen, nitroGLYCERIN, ondansetron **OR** ondansetron (ZOFRAN) IV   Vital Signs    Vitals:   01/11/17 2149 01/11/17 2209 01/12/17 0404 01/12/17 0749  BP: (!) 113/51 122/60 (!) 102/53   Pulse: 95 (!) 102 80   Resp: (!) 23 (!) 23 19   Temp: 99.4 F (37.4 C)  98.3 F (36.8 C)   TempSrc: Oral  Oral   SpO2: 91% 94% 97% 96%  Weight:   289 lb 9.6 oz (131.4 kg)   Height:        Intake/Output Summary (Last 24 hours) at 01/12/17 [id number] Last data filed at 01/12/17 0830  Gross per 24 hour  Intake              723 ml  Output              400 ml  Net              323 ml   Filed Weights   01/10/17 1610 01/12/17 0404  Weight: (!) 360 lb (163.3 kg) 289 lb 9.6 oz (131.4 kg)    Telemetry    SR, ST episodes - Personally Reviewed  ECG    N/a - Personally Reviewed  Physical Exam   GEN: No acute distress. Obese WM  Neck: No JVD Cardiac: Tachy, no murmurs, rubs, or gallops.  Respiratory: Clear to auscultation bilaterally. GI: Soft, nontender, non-distended  MS: No edema; Partial foot amp on right side.  Neuro:   Nonfocal  Psych: Normal affect   Labs    Chemistry Recent Labs Lab 01/10/17 1628 01/11/17 0358  NA 133* 136  K 4.5 4.4  CL 100* 103  CO2 24 26  GLUCOSE 41* 112*  BUN 29* 23*  CREATININE 1.19 0.87  CALCIUM 8.7* 8.6*  PROT 5.6* 5.5*  ALBUMIN 2.8* 2.5*  AST 36 24  ALT 13* 15*  ALKPHOS 50 48  BILITOT 1.3* 0.4  GFRNONAA 58* >60  GFRAA >60 >60  ANIONGAP 9 7     Hematology Recent Labs Lab 01/10/17 1628 01/11/17 0358  WBC 18.2* 11.4*  RBC 3.84* 3.70*  HGB 11.5* 10.9*  HCT 35.3* 33.8*  MCV 91.9 91.4  MCH 29.9 29.5  MCHC 32.6 32.2  RDW 16.0* 15.8*  PLT 228 207    Cardiac Enzymes Recent Labs Lab 01/10/17 1628 01/10/17 2230 01/11/17 0358 01/11/17 1033  TROPONINI 0.54* 0.39* 0.24* 0.18*   No results for input(s): TROPIPOC in the last 168 hours.   BNPNo results for input(s): BNP, PROBNP in the last 168 hours.  DDimer No results for input(s): DDIMER in the last 168 hours.   Radiology    Dg Chest 2 View  Result Date: 01/10/2017 CLINICAL DATA:  Acute onset of worsening generalized weakness. Initial encounter. EXAM: CHEST  2 VIEW COMPARISON:  Chest radiograph performed 10/17/2016 FINDINGS: The lungs are well-aerated. Mild vascular congestion is suggested. Mild left basilar atelectasis is seen. There is no evidence of pleural effusion or pneumothorax. The heart is mildly enlarged. No acute osseous abnormalities are seen. IMPRESSION: Mild vascular congestion and mild cardiomegaly. Mild left basilar atelectasis noted. Lungs otherwise clear. Electronically Signed   By: Garald Balding M.D.   On: 01/10/2017 17:26    Cardiac Studies   TTE: Pending  Patient Profile     76 y.o. male CAD (MI ~2000 s/p PTCA of pLAD, NSTEMI 10/2012 with unsuccessful PCI, treated medically), hard of hearing, legally blind, cholelithiasis, CVA, DM c/b neuropathy, ED, HTN, h/o MRSA infection, h/o stomach ulcers, HLD, morbid obesity, OSA on CPAP, chronic leg edema and venous stasis on  diuretics, paroxysmal atrial fib (pt refused anticoagulation) who presented with GI complaints and developed chest pain/tachycardia.   Assessment & Plan    1. Chest pain: developed a sudden onset of chest pressure and tachycardia yesterday afternoon after his BB and Imdur were held. These were resumed, no further episodes of chest pain reported.  -- Trop with a downward trend, likely demand ischemia in the setting of hypotension. He has known disease in the RCA and LAD territory with unsuccessful PCI attempts in the past.   -- continue ASA, plavix, statin for medical therapy. BB resumed yesterday, but blood pressures are soft this morning. May need to reduce dose, though he does have episodes of ST vs AT on telemetry. He is asymptomatic with these episodes. -- echo pending  2. Diarrhea: Reports 3-4 days prior to admission with poor PO intake. Management per primary.  -- Had a mildly elevated WBC yesterday, CBC/BMET ordered but not drawn yet this morning.   3. Chronic diastolic HF: Given IV fluids on admission. Blood pressures are soft this morning. Would continue to hold lasix at this time.  -- Weight stable, net + but does not appear volume overloaded.   4. Hx of PAF: Refused anticoagulation in the past.   Signed, Reino Bellis, NP  01/12/2017, 9:22 AM

## 2017-01-12 NOTE — Progress Notes (Signed)
  Echocardiogram 2D Echocardiogram has been performed with definity.  Charles [name] 01/12/2017, 10:08 AM

## 2017-01-12 NOTE — Progress Notes (Signed)
Patient requested blood sugar checked at this time.  Capillary glucose 304.  Per patient if he was at home he would take 20 units of Novolog, requesting 10 units at this time.  RN text paged [facility] with this information via Amion.

## 2017-01-13 DIAGNOSIS — A09 Infectious gastroenteritis and colitis, unspecified: Secondary | ICD-10-CM | POA: Diagnosis not present

## 2017-01-13 DIAGNOSIS — E162 Hypoglycemia, unspecified: Secondary | ICD-10-CM | POA: Diagnosis not present

## 2017-01-13 DIAGNOSIS — E785 Hyperlipidemia, unspecified: Secondary | ICD-10-CM | POA: Diagnosis not present

## 2017-01-13 DIAGNOSIS — L089 Local infection of the skin and subcutaneous tissue, unspecified: Secondary | ICD-10-CM | POA: Diagnosis not present

## 2017-01-13 DIAGNOSIS — R748 Abnormal levels of other serum enzymes: Secondary | ICD-10-CM | POA: Diagnosis not present

## 2017-01-13 DIAGNOSIS — Z794 Long term (current) use of insulin: Secondary | ICD-10-CM | POA: Diagnosis not present

## 2017-01-13 DIAGNOSIS — G629 Polyneuropathy, unspecified: Secondary | ICD-10-CM | POA: Diagnosis not present

## 2017-01-13 DIAGNOSIS — E860 Dehydration: Secondary | ICD-10-CM | POA: Diagnosis not present

## 2017-01-13 DIAGNOSIS — I480 Paroxysmal atrial fibrillation: Secondary | ICD-10-CM | POA: Diagnosis not present

## 2017-01-13 DIAGNOSIS — G2581 Restless legs syndrome: Secondary | ICD-10-CM | POA: Diagnosis not present

## 2017-01-13 DIAGNOSIS — I5032 Chronic diastolic (congestive) heart failure: Secondary | ICD-10-CM | POA: Diagnosis not present

## 2017-01-13 DIAGNOSIS — E11649 Type 2 diabetes mellitus with hypoglycemia without coma: Principal | ICD-10-CM

## 2017-01-13 DIAGNOSIS — R262 Difficulty in walking, not elsewhere classified: Secondary | ICD-10-CM | POA: Diagnosis not present

## 2017-01-13 DIAGNOSIS — T8130XA Disruption of wound, unspecified, initial encounter: Secondary | ICD-10-CM | POA: Diagnosis not present

## 2017-01-13 DIAGNOSIS — L03115 Cellulitis of right lower limb: Secondary | ICD-10-CM | POA: Diagnosis not present

## 2017-01-13 DIAGNOSIS — E1149 Type 2 diabetes mellitus with other diabetic neurological complication: Secondary | ICD-10-CM | POA: Diagnosis not present

## 2017-01-13 DIAGNOSIS — E11319 Type 2 diabetes mellitus with unspecified diabetic retinopathy without macular edema: Secondary | ICD-10-CM | POA: Diagnosis not present

## 2017-01-13 DIAGNOSIS — E1165 Type 2 diabetes mellitus with hyperglycemia: Secondary | ICD-10-CM | POA: Diagnosis not present

## 2017-01-13 DIAGNOSIS — E139 Other specified diabetes mellitus without complications: Secondary | ICD-10-CM | POA: Diagnosis not present

## 2017-01-13 DIAGNOSIS — I959 Hypotension, unspecified: Secondary | ICD-10-CM | POA: Diagnosis not present

## 2017-01-13 DIAGNOSIS — J439 Emphysema, unspecified: Secondary | ICD-10-CM | POA: Diagnosis not present

## 2017-01-13 DIAGNOSIS — E113399 Type 2 diabetes mellitus with moderate nonproliferative diabetic retinopathy without macular edema, unspecified eye: Secondary | ICD-10-CM | POA: Diagnosis not present

## 2017-01-13 DIAGNOSIS — I2510 Atherosclerotic heart disease of native coronary artery without angina pectoris: Secondary | ICD-10-CM | POA: Diagnosis not present

## 2017-01-13 DIAGNOSIS — A0472 Enterocolitis due to Clostridium difficile, not specified as recurrent: Secondary | ICD-10-CM | POA: Diagnosis not present

## 2017-01-13 DIAGNOSIS — Z87898 Personal history of other specified conditions: Secondary | ICD-10-CM | POA: Diagnosis not present

## 2017-01-13 DIAGNOSIS — I471 Supraventricular tachycardia: Secondary | ICD-10-CM | POA: Diagnosis not present

## 2017-01-13 DIAGNOSIS — S90821A Blister (nonthermal), right foot, initial encounter: Secondary | ICD-10-CM | POA: Diagnosis not present

## 2017-01-13 DIAGNOSIS — T148XXA Other injury of unspecified body region, initial encounter: Secondary | ICD-10-CM | POA: Diagnosis not present

## 2017-01-13 DIAGNOSIS — E1142 Type 2 diabetes mellitus with diabetic polyneuropathy: Secondary | ICD-10-CM | POA: Diagnosis not present

## 2017-01-13 DIAGNOSIS — I5033 Acute on chronic diastolic (congestive) heart failure: Secondary | ICD-10-CM | POA: Diagnosis not present

## 2017-01-13 DIAGNOSIS — M6281 Muscle weakness (generalized): Secondary | ICD-10-CM | POA: Diagnosis not present

## 2017-01-13 LAB — GLUCOSE, CAPILLARY
GLUCOSE-CAPILLARY: 134 mg/dL — AB (ref 65–99)
GLUCOSE-CAPILLARY: 208 mg/dL — AB (ref 65–99)

## 2017-01-13 MED ORDER — METOPROLOL TARTRATE 25 MG PO TABS: 25.0000 mg | ORAL_TABLET | Freq: Two times a day (BID) | ORAL | 2 refills | Status: DC

## 2017-01-13 MED ORDER — METOPROLOL TARTRATE 50 MG PO TABS: 50.0000 mg | ORAL_TABLET | Freq: Two times a day (BID) | ORAL | Status: DC

## 2017-01-13 MED ORDER — INSULIN ASPART 100 UNIT/ML ~~LOC~~ SOLN: SUBCUTANEOUS | Status: DC

## 2017-01-13 MED ORDER — METOPROLOL TARTRATE 25 MG PO TABS: 50.0000 mg | ORAL_TABLET | Freq: Two times a day (BID) | ORAL | 2 refills | Status: DC

## 2017-01-13 NOTE — NC FL2 (Signed)
[place] LEVEL OF CARE SCREENING TOOL     IDENTIFICATION  Patient Name: Charles [name] Birthdate: Oct 03, 1941 Sex: male Admission Date (Current Location): 01/10/2017  [facility] and [place] Number:  Herbalist and Address:  [facility] [place], [place] [address], [place], [place] [address]      Provider Number: [id number]  Attending Physician Name and Address:  Jonetta Osgood, MD  Relative Name and Phone Number:       Current Level of Care: Hospital Recommended Level of Care: [place] Prior Approval Number:    Date Approved/Denied:   PASRR Number:    Discharge Plan: SNF    Current Diagnoses: Patient Active Problem List   Diagnosis Date Noted  . Hypoglycemia 01/11/2017  . Demand ischemia of myocardium ([place])   . Diarrhea 01/10/2017  . Elevated troponin 01/10/2017  . Dehydration 01/10/2017  . Hypoglycemia due to type 2 diabetes mellitus ([place]) 01/10/2017  . Hypoglycemia associated with diabetes ([place]) 10/17/2016  . Chronic ulcer of left foot ([place]) 08/31/2016  . Acute on chronic diastolic (congestive) heart failure 08/20/2016  . Venous stasis   . Lower extremity edema   . Hypercholesterolemia   . Diabetic neuropathy ([place])   . Unstable angina ([place]) 08/15/2016  . 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 Pressure ulcer 12/27/2015  . Gangrene of toe ([place]) 12/26/2015  . Acute renal failure (ARF) ([place]) 12/26/2015  . Subconjunctival hemorrhage 10/21/2015  . Cellulitis in diabetic foot ([place])   . Cellulitis 05/06/2015  . COPD exacerbation ([place]) 10/10/2014  . Hematuria 08/12/2014  . Hypokalemia 08/12/2014  . Urinary retention 08/03/2014  . Hypotension 07/26/2014  . COPD  07/26/2014  . Rash and nonspecific skin eruption 02/13/2014  . PAF (paroxysmal atrial fibrillation) ([place]) 11/13/2013  . Morbid obesity ([place]) 05/24/2012  . Type 2 diabetes, uncontrolled, with retinopathy ([place]) 02/09/2012  . NEOPLASM OF UNCERTAIN BEHAVIOR  OF SKIN 12/24/2010  . WEIGHT GAIN, ABNORMAL 02/02/2010  . TOBACCO USE, QUIT 02/02/2010  . HYPOGONADISM 07/12/2007  . B12 deficiency 07/12/2007  . Essential hypertension 07/12/2007  . MYOCARDIAL INFARCTION, HX OF 07/12/2007  . CAD (coronary artery disease) 07/12/2007  . History of CVA (cerebrovascular accident) 07/12/2007  . VENOUS INSUFFICIENCY, LEGS 07/12/2007  . SYMPTOM, MEMORY LOSS 07/12/2007  . DIVERTICULITIS, HX OF 07/12/2007    Orientation RESPIRATION BLADDER Height & Weight     Self, Time, Situation, Place  Normal Continent Weight: 131.4 kg (289 lb 9.6 oz) Height:  6\' 4"  (193 cm)  BEHAVIORAL SYMPTOMS/MOOD NEUROLOGICAL BOWEL NUTRITION STATUS   (NONE)  (NONE) Continent Diet (Health Healthy)  AMBULATORY STATUS COMMUNICATION OF NEEDS Skin   Extensive Assist Verbally Normal                       Personal Care Assistance Level of Assistance  Bathing, Feeding, Dressing Bathing Assistance: Limited assistance Feeding assistance: Independent Dressing Assistance: Limited assistance     Functional Limitations Info  Sight, Hearing, Speech Sight Info: Adequate Hearing Info: Adequate Speech Info: Adequate    SPECIAL CARE FACTORS FREQUENCY  PT (By licensed PT), OT (By licensed OT)     PT Frequency: 5/week OT Frequency: 5/week            Contractures Contractures Info: Not present    Additional Factors Info  Code Status, Allergies, Insulin Sliding Scale Code Status Info: Full Code Allergies Info: Xarelto Rivaroxaban   Insulin Sliding Scale Info: 3/day  Current Medications (01/13/2017):  This is the current hospital active medication list Current Facility-Administered Medications  Medication Dose Route Frequency Provider Last Rate Last Dose  . acetaminophen (TYLENOL) tablet 650 mg  650 mg Oral Q6H PRN Vianne Bulls, MD   650 mg at 01/13/17 0025   Or  . acetaminophen (TYLENOL) suppository 650 mg  650 mg Rectal Q6H PRN Vianne Bulls, MD      . aspirin EC  tablet 81 mg  81 mg Oral Daily Vianne Bulls, MD   81 mg at 01/12/17 1036  . cholecalciferol (VITAMIN D) tablet 1,000 Units  1,000 Units Oral Daily Vianne Bulls, MD   1,000 Units at 01/12/17 1036  . clopidogrel (PLAVIX) tablet 75 mg  75 mg Oral Daily Vianne Bulls, MD   75 mg at 01/12/17 1036  . enoxaparin (LOVENOX) injection 80 mg  80 mg Subcutaneous Q24H Vianne Bulls, MD   80 mg at 01/12/17 1039  . gabapentin (NEURONTIN) capsule 300 mg  300 mg Oral TID PRN Vianne Bulls, MD   300 mg at 01/13/17 0024  . HYDROcodone-acetaminophen (NORCO/VICODIN) 5-325 MG per tablet 1-2 tablet  1-2 tablet Oral Q4H PRN Ilene Qua Opyd, MD      . insulin aspart (novoLOG) injection 0-20 Units  0-20 Units Subcutaneous TID WC Shanker Kristeen Mans, MD      . insulin aspart (novoLOG) injection 0-5 Units  0-5 Units Subcutaneous QHS Jonetta Osgood, MD      . isosorbide mononitrate (IMDUR) 24 hr tablet 30 mg  30 mg Oral Daily Cheryln Manly, NP   30 mg at 01/12/17 1037  . metoprolol tartrate (LOPRESSOR) tablet 25 mg  25 mg Oral BID Jonetta Osgood, MD   25 mg at 01/12/17 2106  . mometasone-formoterol (DULERA) 100-5 MCG/ACT inhaler 2 puff  2 puff Inhalation BID Vianne Bulls, MD   2 puff at 01/12/17 2008  . nitroGLYCERIN (NITROSTAT) SL tablet 0.4 mg  0.4 mg Sublingual Q5 min PRN Vianne Bulls, MD   0.4 mg at 01/11/17 1355  . ondansetron (ZOFRAN) tablet 4 mg  4 mg Oral Q6H PRN Vianne Bulls, MD       Or  . ondansetron (ZOFRAN) injection 4 mg  4 mg Intravenous Q6H PRN Ilene Qua Opyd, MD      . pioglitazone (ACTOS) tablet 45 mg  45 mg Oral Daily Jonetta Osgood, MD   45 mg at 01/12/17 1038  . pravastatin (PRAVACHOL) tablet 40 mg  40 mg Oral q1800 Vianne Bulls, MD   40 mg at 01/12/17 1624  . sodium chloride flush (NS) 0.9 % injection 3 mL  3 mL Intravenous Q12H Ilene Qua Opyd, MD   3 mL at 01/12/17 1046  . vitamin B-12 (CYANOCOBALAMIN) tablet 1,000 mcg  1,000 mcg Oral Daily Vianne Bulls, MD   1,000 mcg at  01/12/17 1038     Discharge Medications: Please see discharge summary for a list of discharge medications.  Relevant Imaging Results:  Relevant Lab Results:   Additional Information SSN: [id number]  Rigoberto Noel, LCSW

## 2017-01-13 NOTE — Clinical Social Work Placement (Signed)
   CLINICAL SOCIAL WORK PLACEMENT  NOTE  Date:  01/13/2017  Patient Details  Name: Charles [name] MRN: [id number] Date of Birth: Jan 17, 1941  Clinical Social Work is seeking post-discharge placement for this patient at the [place] level of care (*CSW will initial, date and re-position this form in  chart as items are completed):  Yes   Patient/family provided with [place] Work Department's list of facilities offering this level of care within the geographic area requested by the patient (or if unable, by the patient's family).  Yes   Patient/family informed of their freedom to choose among providers that offer the needed level of care, that participate in Medicare, Medicaid or managed care program needed by the patient, have an available bed and are willing to accept the patient.  Yes   Patient/family informed of [place]'s ownership interest in [facility] and [facility], as well as of the fact that they are under no obligation to receive care at these facilities.  PASRR submitted to EDS on 01/13/17     PASRR number received on 01/13/17     Existing PASRR number confirmed on       FL2 transmitted to all facilities in geographic area requested by pt/family on 01/13/17     FL2 transmitted to all facilities within larger geographic area on       Patient informed that his/her managed care company has contracts with or will negotiate with certain facilities, including the following:        Yes   Patient/family informed of bed offers received.  Patient chooses bed at [facility]     Physician recommends and patient chooses bed at      Patient to be transferred to [facility] on 01/13/17.  Patient to be transferred to facility by Ambulance     Patient family notified on 01/13/17 of transfer.  Name of family member notified:  Patient has notified his wife     PHYSICIAN Please prepare priority  discharge summary, including medications, Please prepare prescriptions, Please sign FL2     Additional Comment:    _______________________________________________ Rigoberto Noel, LCSW 01/13/2017, 1:19 PM

## 2017-01-13 NOTE — Clinical Social Work Note (Signed)
Clinical Social Work Assessment  Patient Details  Name: Charles [name] MRN: [id number] Date of Birth: 04/14/1941  Date of referral:  01/13/17               Reason for consult:  Facility Placement, Discharge Planning                Permission sought to share information with:  Facility Sport and exercise psychologist, Family Supports Permission granted to share information::  Yes, Verbal Permission Granted  Name::     Web designer::  SNFs  Relationship::     Contact Information:     Housing/Transportation Living arrangements for the past 2 months:  [place] of Information:  Patient Patient Interpreter Needed:  None Criminal Activity/Legal Involvement Pertinent to Current Situation/Hospitalization:  No - Comment as needed Significant Relationships:  Adult Children, Spouse Lives with:  Spouse, Adult Children Do you feel safe going back to the place where you live?  Yes Need for family participation in patient care:  Yes (Comment)  Care giving concerns:  The patient agrees with recommendation for SNF as he does not feel he can manage at home in his current condition.   Social Worker assessment / plan:  CSW met with the patient at bedside to complete assessment. The patient shares that he admits from home where he lives with his wife. He shares that his adult son and family also lives with him at this time. CSW explained that PT has recommended SNF placement at time of discharge. The patient is agreeable to placement and has no facility preference at this time. CSW explained SNF search/placement process and answered the patient's questions. The patient did have several complaints regarding his ED experience and medication changes. CSW has deferred these concerns to unit director who will meet with the patient. CSW will follow up with bed offers once available.  Employment status:  Retired Forensic scientist:  [employer] PT Recommendations:  [place]  / Referral to community resources:  [place] Patient/Family's Response to care:  The patient is appreciative of CSW's assistance with placement.  Patient/Family's Understanding of and Emotional Response to Diagnosis, Current Treatment, and Prognosis:  The patient appears to have a fair understanding of his condition and reason for hospitalization, though he doesn't seem to understand why some of his medications were held.  Emotional Assessment Appearance:  Appears stated age Attitude/Demeanor/Rapport:  Other (Patient is appropriate and welcoming of CSW.) Affect (typically observed):  Accepting, Appropriate, Calm, Pleasant Orientation:  Oriented to Place, Oriented to Self, Oriented to  Time, Oriented to Situation Alcohol / Substance use:  Not Applicable Psych involvement (Current and /or in the community):  No (Comment)  Discharge Needs  Concerns to be addressed:  Care Coordination, Discharge Planning Concerns Readmission within the last 30 days:  No Current discharge risk:  Chronically ill, Physical Impairment Barriers to Discharge:  Continued Medical Work up   Fredderick Phenix B, LCSW 01/13/2017, 5:10 AM

## 2017-01-13 NOTE — Clinical Social Work Note (Signed)
Patient has accepted bed at [facility].  Liz Beach MSW, [place], [employer], [id number]

## 2017-01-13 NOTE — Clinical Social Work Placement (Signed)
   CLINICAL SOCIAL WORK PLACEMENT  NOTE  Date:  01/13/2017  Patient Details  Name: Charles [name] MRN: [id number] Date of Birth: 19-Dec-1940  Clinical Social Work is seeking post-discharge placement for this patient at the [place] level of care (*CSW will initial, date and re-position this form in  chart as items are completed):  Yes   Patient/family provided with [place] Work Department's list of facilities offering this level of care within the geographic area requested by the patient (or if unable, by the patient's family).  Yes   Patient/family informed of their freedom to choose among providers that offer the needed level of care, that participate in Medicare, Medicaid or managed care program needed by the patient, have an available bed and are willing to accept the patient.  Yes   Patient/family informed of [place]'s ownership interest in [facility] and [facility], as well as of the fact that they are under no obligation to receive care at these facilities.  PASRR submitted to EDS on 01/13/17     PASRR number received on       Existing PASRR number confirmed on       FL2 transmitted to all facilities in geographic area requested by pt/family on 01/13/17     FL2 transmitted to all facilities within larger geographic area on       Patient informed that his/her managed care company has contracts with or will negotiate with certain facilities, including the following:            Patient/family informed of bed offers received.  Patient chooses bed at       Physician recommends and patient chooses bed at      Patient to be transferred to   on  .  Patient to be transferred to facility by       Patient family notified on   of transfer.  Name of family member notified:        PHYSICIAN Please prepare priority discharge summary, including medications, Please prepare prescriptions, Please sign FL2     Additional Comment:     _______________________________________________ Rigoberto Noel, LCSW 01/13/2017, 5:29 AM

## 2017-01-13 NOTE — Progress Notes (Signed)
Subjective: Charles [name] is a 76 y.o. 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 He presents with his wife. She states that he is doing significantly better than he was last appointment. The wound is almost completely healed. She has continue Santyl on though incision site. Denies any drainage or pus and denies any surrounding redness or swelling to his foot. He started have some weakness and he has not been out of the house much other than going to doctor's appointments. He is currently not taking antibiotics. Denies any systemic complaints such as fevers, chills, nausea, vomiting. No Pain, chest pain, short stress.  Objective: General: No acute distress, AAOx3  DP/PT pulses palpable 2/4, CRT < 3 sec to all digits.  Right foot: Incision with sutures intact. Hyperkeratotic tissue around the incision.  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 This appears to be substantially improved compared to last appointment. This appears to be superficial along the incision and there is no probing, undermining or tunneling. There is no significant edema, erythema, increase in warmth. He continues to get some mild intermittent sharp pains to the right foot. There are no other open lesions identified bilaterally. There are no other open lesions or pre-ulcer lesions identified. No pain with calf compression, swelling, warmth, erythema.   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 The wound is healing well. Recommend continue with Santyl dressing changes to the right side daily. He has started transition to a regular shoe as tolerated as I believe the CAM boot is limiting his ability to ambulate and he is getting weak. Also  recommended start home physical therapy this was ordered today as well. -Monitor for any signs or symptoms of infection of the ER should any occur. -Follow-up as scheduled or sooner if needed. Call any questions or concerns meantime.  Celesta Gentile, DPM

## 2017-01-13 NOTE — Progress Notes (Signed)
Pharmacy review of meds for discharge to SNF.  No inconsistencies noted. Medications for discharge are ordered, continued, and discontinued appropriately for this patient.  Thank you, Jens Som, PharmD Clinical phone for 01/13/2017 from 7a-3:30p: [phone] If after 3:30p, please call main pharmacy at: [phone] 01/13/2017 1:55 PM

## 2017-01-13 NOTE — Care Management Note (Signed)
Case Management Note  Patient Details  Name: PATICK LANGELL MRN: [id number] Date of Birth: Mar 25, 1941  Subjective/Objective: Pt presented for diarrhea weakness and falls. Pt hypotensive and hypoglycemic. Pt was previously home with wife. Plan will be to d/c to SNF.                   Action/Plan: CSW assisting with disposition needs. No further needs from CM at this time.   Expected Discharge Date:  01/13/17               Expected Discharge Plan:  Skilled Nursing Facility  In-House Referral:  Clinical Social Work  Discharge planning Services  CM Consult  Post Acute Care Choice:  NA Choice offered to:  NA  DME Arranged:  N/A DME Agency:  NA  HH Arranged:  NA HH Agency:  NA  Status of Service:  Completed, signed off  If discussed at [employer] of Stay Meetings, dates discussed:    Additional Comments:  Bethena Roys, RN 01/13/2017, 10:28 AM

## 2017-01-13 NOTE — Discharge Summary (Addendum)
PATIENT DETAILS Name: Charles [name] Age: 76 y.o. Sex: male Date of Birth: 1941/04/27 MRN: [id number]. Admitting Physician: Charles Bulls, MD [id number] Plotnikov, MD  Admit Date: 01/10/2017 Discharge date: 01/13/2017  Recommendations for Outpatient Follow-up:  1. Follow up with PCP in 1-2 weeks 2. Please obtain BMP/CBC in one week 3. Stopped Amaryl-has had recurrent admissions for hypoglycemia, if in the future if anti-glycemic agents are needed-consider adding agents that do not cause hypoglycemia  4. Follow blood cultures until final-negative so far  Admitted From:  Home  Disposition: SNF   Home Health: No  Equipment/Devices: None  Discharge Condition: Stable  CODE STATUS: FULL CODE  Diet recommendation:  Heart Healthy / Carb Modified   Brief Summary: See H&P, Labs, Consult and Test reports for all details in brief,Patient is a 76 y.o. male with h/o CAD, CVA, chronic diastolic Hf, paroxysmal atrial fibrillation not on anticoagulation, and T2DM insulin dependent. He presented to the ED with several day hx of progressive weakness, lethargy, recurrent falls and loos stools. He was found to be hypotensive and hypoglycemic. He was admitted for further management, see below  Brief Hospital Course: Hypoglycemia associated with diabetes: This is a recurrent issue  (similar admission in November 2017) -likely due to strict glycemic control-his A1c is 5.6, not sure if patient has reliable oral intake. Given recurrent hypoglycemia would allow mild permissive hyperglycemia to prevent life-threatening episodes of hypoglycemia (CBG down to 34!). CBGs are more stable-plans are to continue with SSI, and Actos. Patient does not want to start long acting Insulin. For now his CBG's are relatively stable with SSI and Actos. If in the future, additional oral agents are needed-would add agents that do not cause hypoglycemia. Long discussion with patient multiple times during this hospital stay,  he was initially reluctant to change medications, but now seems agreeable.   Hypotension: Due to secondary hypovolemia from poor oral intake, antihypertensive use and probable diarrhea prior to admission. Antihypertensives were initially held, once BP was felt to be stable-this has been resumed.   Elevated troponin: Probably demands ischemia in the setting of hypotension (initial blood pressure on presentation was 80/43, and lowest BP was 71/40 while in the emergency room). Troponin trend is flat and not consistent with ACS. 2-D echocardiogram showed EF around 60-65% without any regional wall motion abnormalities.  Paroxysmal Atrial Tachycardia:Very brief-continue Metoprolol for now  Diarrhea: No diarrhea since admission-per RN-stools are now soft. Since no loose stools or diarrhea-no rationale to check stool studies. May have had a transient viral illness that caused him to have diarrhea at home.  Leukocytosis: Has down trended without any antimicrobial treatment. No obvious infective foci evident. Chest x-ray/UA negative. Blood cultures negative so far.Right transmetatarsal site with no erythema,discharge or ulceration. He looks much better today-continue to monitor off antimicrobial therapy.  Type 2 diabetes, uncontrolled, with retinopathy ([place]): See above.   PAF (paroxysmal atrial fibrillation): On metoprolol for rate control-not on anticoagulation by patient's choice. EKG 01/10/17 does not show atrial fibrillation.CHA2DS2-Vas score of at least 4.   Chronic Diastolic HF: Remains compensated-initially Lasix was held-but since BP remains stable-Lasix will be resumed on discharge.  COPD: No exacerbation during this admission so far. 96% O2 sat RA. Continue Dulera.        CAD (coronary artery disease): Continue daily ASA, Plavix, Metoprolol, Imdur and statin. Continue heart healthy diet.     Dyslipidemia: Continue statin  History of CVA: Continue daily ASA and Plavix regimen.    Gen Deconditioning:  Suspect multifactorial-recent diarrheal illness, hypoglycemia, hypotension, dehydration or contributing. 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 Seen by physical therapy, plans are for SNF on discharge  Procedures/Studies: Echo 1/31>>EF 60% to 65%  Discharge Diagnoses:  Principal Problem:   Hypoglycemia associated with diabetes ([name]) Active Problems:   CAD (coronary artery disease)   History of CVA (cerebrovascular accident)   Type 2 diabetes, uncontrolled, with retinopathy ([name])   PAF (paroxysmal atrial fibrillation) ([name])   Hypotension   COPD    Diarrhea   Elevated troponin   Dehydration   Hypoglycemia due to type 2 diabetes mellitus ([name])   Hypoglycemia   Discharge Instructions:  Activity:  As tolerated with Full fall precautions use Charles/cane & assistance as needed   Discharge Instructions    Call MD for:  persistant dizziness or light-headedness    Complete by:  As directed    Call MD for:  persistant nausea and vomiting    Complete by:  As directed    Call MD for:  severe uncontrolled pain    Complete by:  As directed    Diet - low sodium heart healthy    Complete by:  As directed    Diet Carb Modified    Complete by:  As directed    Discharge instructions    Complete by:  As directed    Follow with Primary MD  Charles Kehr, MD after discharge from [facility] before meals and at bedtime  Have stopped Amary/ (Glimiperide)-if other oral hypoglycemic agents have to be used, please consider adding agents with less potential for hypoglycemia  Please get a complete blood count and chemistry panel checked by your Primary MD at your next visit, and again as instructed by your Primary MD.  Get Medicines reviewed and adjusted: Please take all your medications with you for your next visit with your  Primary MD  Laboratory/radiological data: Please request your Primary MD to go over all hospital tests and procedure/radiological results at the follow up, please ask your Primary MD to get all Hospital records sent to his/her office.  In some cases, they will be blood work, cultures and biopsy results pending at the time of your discharge. Please request that your primary care M.D. follows up on these results.  Also Note the following: If you experience worsening of your admission symptoms, develop shortness of breath, life threatening emergency, suicidal or homicidal thoughts you must seek medical attention immediately by calling 911 or calling your MD immediately  if symptoms less severe.  You must read complete instructions/literature along with all the possible adverse reactions/side effects for all the Medicines you take and that have been prescribed to you. Take any new Medicines after you have completely understood and accpet all the possible adverse reactions/side effects.   Do not drive when taking Pain medications or sleeping medications (Benzodaizepines)  Do not take more than prescribed Pain, Sleep and Anxiety Medications. It is not advisable to combine anxiety,sleep and pain medications without talking with your primary care practitioner  Special Instructions: If you have smoked or chewed Tobacco  in the last 2 yrs please stop smoking, stop any regular Alcohol  and or any Recreational drug use.  Wear Seat belts while driving.  Please note: You were cared for by a hospitalist during your hospital stay. Once you are discharged, your primary care physician will handle any further medical issues. Please  note that NO REFILLS for any discharge medications will be authorized once you are discharged, as it is imperative that you return to your primary care physician (or establish a relationship with a primary care physician if you do not have one) for your post hospital discharge needs so  that they can reassess your need for medications and monitor your lab values.   Increase activity slowly    Complete by:  As directed      Allergies as of 01/13/2017      Reactions   Xarelto [rivaroxaban] Rash      Medication List    STOP taking these medications   glimepiride 4 MG tablet Commonly known as:  AMARYL   ibuprofen 200 MG tablet Commonly known as:  ADVIL,MOTRIN   insulin aspart 100 UNIT/ML FlexPen Commonly known as:  NOVOLOG FLEXPEN Replaced by:  insulin aspart 100 UNIT/ML injection   NONFORMULARY OR COMPOUNDED ITEM     TAKE these medications   acetaminophen 500 MG tablet Commonly known as:  TYLENOL Take 1,000 mg by mouth at bedtime.   aspirin EC 81 MG tablet Take 81 mg by mouth daily.   cholecalciferol 1000 units tablet Commonly known as:  VITAMIN D Take 1,000 Units by mouth daily.   clopidogrel 75 MG tablet Commonly known as:  PLAVIX Take 1 tablet (75 mg total) by mouth daily. What changed:  when to take this   collagenase ointment Commonly known as:  SANTYL Apply 1 application topically daily. What changed:  when to take this  reasons to take this   Fluticasone-Salmeterol 100-50 MCG/DOSE Aepb Commonly known as:  ADVAIR DISKUS Inhale 1 puff into the lungs 2 (two) times daily. What changed:  when to take this  reasons to take this   furosemide 80 MG tablet Commonly known as:  LASIX Take 1/2 tablet daily alternating with whole tablet daily What changed:  how much to take  how to take this  when to take this  additional instructions   gabapentin 300 MG capsule Commonly known as:  NEURONTIN TAKE 1 CAPSULE THREE TIMES DAILY AS NEEDED FOR NERVE PAIN What changed:  See the new instructions.   insulin aspart 100 UNIT/ML injection Commonly known as:  novoLOG 0-20 Units, Subcutaneous, 3 times daily with meals, First dose on Thu 01/13/17 at 0800 Correction coverage: Resistant (obese, steroids) CBG < 70: implement hypoglycemia protocol  CBG 70 - 120: 0 units CBG 121 - 150: 2 units CBG 151 - 200: 4 units CBG 201 - 250: 7 units CBG 251 - 300: 9 units CBG 301 - 350: 12units CBG 351 - 400: 15 units CBG > 400: call MD Replaces:  insulin aspart 100 UNIT/ML FlexPen   isosorbide mononitrate 30 MG 24 hr tablet Commonly known as:  IMDUR Take 1 tablet (30 mg total) by mouth daily.   losartan 100 MG tablet Commonly known as:  COZAAR Take 1 tablet (100 mg total) by mouth daily.   metoprolol tartrate 25 MG tablet Commonly known as:  LOPRESSOR Take 1 tablet (25 mg total) by mouth 2 (two) times daily. What changed:  See the new instructions.   nitroGLYCERIN 0.4 MG SL tablet Commonly known as:  NITROSTAT Place 1 tablet (0.4 mg total) under the tongue every 5 (five) minutes as needed (up to 3 doses). For chest pain What changed:  reasons to take this  additional instructions   pioglitazone 45 MG tablet Commonly known as:  ACTOS Take 1 tablet (45 mg total) by mouth  daily.   potassium chloride SA 20 MEQ tablet Commonly known as:  K-DUR,KLOR-CON Take 1 tablet (20 mEq total) by mouth daily. What changed:  when to take this   pravastatin 40 MG tablet Commonly known as:  PRAVACHOL Take 1 tablet (40 mg total) by mouth daily. What changed:  when to take this   triamcinolone cream 0.5 % Commonly known as:  KENALOG Apply 1 application topically 2 (two) times daily as needed (rash).   vitamin B-12 1000 MCG tablet Commonly known as:  CYANOCOBALAMIN Take 1,000 mcg by mouth daily.      Follow-up Information    Charles Kehr, MD. Schedule an appointment as soon as possible for a visit in 1 week(s).   Specialty:  Internal Medicine Contact information: [place] [address] [phone]          Allergies  Allergen Reactions  . Xarelto [Rivaroxaban] Rash    Consultations:   cardiology   Other Procedures/Studies: Dg Chest 2 View  Result Date: 01/10/2017 CLINICAL DATA:  Acute onset of worsening  generalized weakness. Initial encounter. EXAM: CHEST  2 VIEW COMPARISON:  Chest radiograph performed 10/17/2016 FINDINGS: The lungs are well-aerated. Mild vascular congestion is suggested. Mild left basilar atelectasis is seen. There is no evidence of pleural effusion or pneumothorax. The heart is mildly enlarged. No acute osseous abnormalities are seen. IMPRESSION: Mild vascular congestion and mild cardiomegaly. Mild left basilar atelectasis noted. Lungs otherwise clear. Electronically Signed   By: Garald Balding M.D.   On: 01/10/2017 17:26     TODAY-DAY OF DISCHARGE:  Subjective:   Shonna Chock today has no headache,no chest abdominal pain,no new weakness tingling or numbness  Objective:   Blood pressure (!) 116/42, pulse 80, temperature 97.8 F (36.6 C), temperature source Axillary, resp. rate 12, height 6\' 4"  (1.93 m), weight (!) 161.9 kg (356 lb 14.4 oz), SpO2 95 %.  Intake/Output Summary (Last 24 hours) at 01/13/17 0943 Last data filed at 01/13/17 0842  Gross per 24 hour  Intake             1081 ml  Output             1200 ml  Net             -119 ml   Filed Weights   01/10/17 1610 01/12/17 0404 01/13/17 0630  Weight: (!) 163.3 kg (360 lb) 131.4 kg (289 lb 9.6 oz) (!) 161.9 kg (356 lb 14.4 oz)    Exam: Awake Alert, Oriented *3, No new F.N deficits, Normal affect [place].AT,PERRAL Supple Neck,No JVD, No cervical lymphadenopathy appriciated.  Symmetrical Chest wall movement, Good air movement bilaterally, CTAB RRR,No Gallops,Rubs or new Murmurs, No Parasternal Heave +ve B.Sounds, Abd Soft, Non tender, No organomegaly appriciated, No rebound -guarding or rigidity. No Cyanosis, Clubbing or edema, No new Rash or bruise   PERTINENT RADIOLOGIC STUDIES: Dg Chest 2 View  Result Date: 01/10/2017 CLINICAL DATA:  Acute onset of worsening generalized weakness. Initial encounter. EXAM: CHEST  2 VIEW COMPARISON:  Chest radiograph performed 10/17/2016 FINDINGS: The lungs are well-aerated.  Mild vascular congestion is suggested. Mild left basilar atelectasis is seen. There is no evidence of pleural effusion or pneumothorax. The heart is mildly enlarged. No acute osseous abnormalities are seen. IMPRESSION: Mild vascular congestion and mild cardiomegaly. Mild left basilar atelectasis noted. Lungs otherwise clear. Electronically Signed   By: Garald Balding M.D.   On: 01/10/2017 17:26     PERTINENT LAB RESULTS: CBC:  Recent Labs  01/11/17 0358 01/12/17 0901  WBC 11.4* 12.6*  HGB 10.9* 12.3*  HCT 33.8* 38.4*  PLT 207 247   CMET CMP     Component Value Date/Time   NA 133 (L) 01/12/2017 0901   K 4.6 01/12/2017 0901   CL 101 01/12/2017 0901   CO2 23 01/12/2017 0901   GLUCOSE 194 (H) 01/12/2017 0901   GLUCOSE 136 (H) 12/22/2006 0827   BUN 26 (H) 01/12/2017 0901   CREATININE 0.94 01/12/2017 0901   CREATININE 1.17 08/30/2016 1014   CALCIUM 9.0 01/12/2017 0901   PROT 5.5 (L) 01/11/2017 0358   ALBUMIN 2.5 (L) 01/11/2017 0358   AST 24 01/11/2017 0358   ALT 15 (L) 01/11/2017 0358   ALKPHOS 48 01/11/2017 0358   BILITOT 0.4 01/11/2017 0358   GFRNONAA >60 01/12/2017 0901   GFRAA >60 01/12/2017 0901    GFR Estimated Creatinine Clearance: 112.2 mL/min (by C-G formula based on SCr of 0.94 mg/dL). No results for input(s): LIPASE, AMYLASE in the last 72 hours.  Recent Labs  01/10/17 2230 01/11/17 0358 01/11/17 1033  TROPONINI 0.39* 0.24* 0.18*   Invalid input(s): POCBNP No results for input(s): DDIMER in the last 72 hours.  Recent Labs  01/11/17 1126  HGBA1C 5.6   No results for input(s): CHOL, HDL, LDLCALC, TRIG, CHOLHDL, LDLDIRECT in the last 72 hours.  Recent Labs  01/10/17 2230  TSH 1.205   No results for input(s): VITAMINB12, FOLATE, FERRITIN, TIBC, IRON, RETICCTPCT in the last 72 hours. Coags: No results for input(s): INR in the last 72 hours.  Invalid input(s): PT Microbiology: Recent Results (from the past 240 hour(s))  Culture, blood (routine x  2)     Status: None (Preliminary result)   Collection Time: 01/10/17  8:16 PM  Result Value Ref Range Status   Specimen Description BLOOD LEFT HAND  Final   Special Requests IN PEDIATRIC BOTTLE [place]  Final   Culture NO GROWTH 2 DAYS  Final   Report Status PENDING  Incomplete  Culture, blood (routine x 2)     Status: None (Preliminary result)   Collection Time: 01/10/17  8:21 PM  Result Value Ref Range Status   Specimen Description BLOOD RIGHT HAND  Final   Special Requests IN PEDIATRIC BOTTLE 4CC  Final   Culture NO GROWTH 2 DAYS  Final   Report Status PENDING  Incomplete    FURTHER DISCHARGE INSTRUCTIONS:  Get Medicines reviewed and adjusted: Please take all your medications with you for your next visit with your Primary MD  Laboratory/radiological data: Please request your Primary MD to go over all hospital tests and procedure/radiological results at the follow up, please ask your Primary MD to get all Hospital records sent to his/her office.  In some cases, they will be blood work, cultures and biopsy results pending at the time of your discharge. Please request that your primary care M.D. goes through all the records of your hospital data and follows up on these results.  Also Note the following: If you experience worsening of your admission symptoms, develop shortness of breath, life threatening emergency, suicidal or homicidal thoughts you must seek medical attention immediately by calling 911 or calling your MD immediately  if symptoms less severe.  You must read complete instructions/literature along with all the possible adverse reactions/side effects for all the Medicines you take and that have been prescribed to you. Take any new Medicines after you have completely understood and accpet all the possible adverse reactions/side  effects.   Do not drive when taking Pain medications or sleeping medications (Benzodaizepines)  Do not take more than prescribed Pain, Sleep and  Anxiety Medications. It is not advisable to combine anxiety,sleep and pain medications without talking with your primary care practitioner  Special Instructions: If you have smoked or chewed Tobacco  in the last 2 yrs please stop smoking, stop any regular Alcohol  and or any Recreational drug use.  Wear Seat belts while driving.  Please note: You were cared for by a hospitalist during your hospital stay. Once you are discharged, your primary care physician will handle any further medical issues. Please note that NO REFILLS for any discharge medications will be authorized once you are discharged, as it is imperative that you return to your primary care physician (or establish a relationship with a primary care physician if you do not have one) for your post hospital discharge needs so that they can reassess your need for medications and monitor your lab values.  Total Time spent coordinating discharge including counseling, education and face to face time equals 45 minutes  Signed: Oren Binet 01/13/2017 9:43 AM

## 2017-01-13 NOTE — Progress Notes (Signed)
Progress Note  Patient Name: Charles [name] Date of Encounter: 01/13/2017  Primary Cardiologist: Dr. Harrington Challenger  Subjective   No further episodes of chest pain. Still with intermittent tachycardia. Echo showed EF 60-65% without new wall motion abnormalities, although it was a technically difficult study.   Inpatient Medications    Scheduled Meds: . aspirin EC  81 mg Oral Daily  . cholecalciferol  1,000 Units Oral Daily  . clopidogrel  75 mg Oral Daily  . enoxaparin (LOVENOX) injection  80 mg Subcutaneous Q24H  . insulin aspart  0-20 Units Subcutaneous TID WC  . insulin aspart  0-5 Units Subcutaneous QHS  . isosorbide mononitrate  30 mg Oral Daily  . metoprolol tartrate  50 mg Oral BID  . mometasone-formoterol  2 puff Inhalation BID  . pioglitazone  45 mg Oral Daily  . pravastatin  40 mg Oral q1800  . sodium chloride flush  3 mL Intravenous Q12H  . vitamin B-12  1,000 mcg Oral Daily   Continuous Infusions:  PRN Meds: acetaminophen **OR** acetaminophen, gabapentin, HYDROcodone-acetaminophen, nitroGLYCERIN, ondansetron **OR** ondansetron (ZOFRAN) IV   Vital Signs    Vitals:   01/12/17 2010 01/12/17 2053 01/13/17 0630 01/13/17 0727  BP:  137/61 (!) 116/42   Pulse:  93 80   Resp:  18 12   Temp:  98.5 F (36.9 C) 97.8 F (36.6 C)   TempSrc:  Oral Axillary   SpO2: 98% 96% 95% 95%  Weight:   (!) 356 lb 14.4 oz (161.9 kg)   Height:        Intake/Output Summary (Last 24 hours) at 01/13/17 0931 Last data filed at 01/13/17 0842  Gross per 24 hour  Intake             1081 ml  Output             1200 ml  Net             -119 ml   Filed Weights   01/10/17 1610 01/12/17 0404 01/13/17 0630  Weight: (!) 360 lb (163.3 kg) 289 lb 9.6 oz (131.4 kg) (!) 356 lb 14.4 oz (161.9 kg)    Telemetry    SR, ST episodes - Personally Reviewed  ECG    N/a - Personally Reviewed  Physical Exam   GEN: No acute distress. Obese WM  Neck: No JVD Cardiac: Tachy, no murmurs, rubs, or  gallops.  Respiratory: Clear to auscultation bilaterally. GI: Soft, nontender, non-distended  MS: No edema; Partial foot amp on right side.  Neuro:  Nonfocal  Psych: Normal affect   Labs    Chemistry  Recent Labs Lab 01/10/17 1628 01/11/17 0358 01/12/17 0901  NA 133* 136 133*  K 4.5 4.4 4.6  CL 100* 103 101  CO2 24 26 23   GLUCOSE 41* 112* 194*  BUN 29* 23* 26*  CREATININE 1.19 0.87 0.94  CALCIUM 8.7* 8.6* 9.0  PROT 5.6* 5.5*  --   ALBUMIN 2.8* 2.5*  --   AST 36 24  --   ALT 13* 15*  --   ALKPHOS 50 48  --   BILITOT 1.3* 0.4  --   GFRNONAA 58* >60 >60  GFRAA >60 >60 >60  ANIONGAP 9 7 9      Hematology  Recent Labs Lab 01/10/17 1628 01/11/17 0358 01/12/17 0901  WBC 18.2* 11.4* 12.6*  RBC 3.84* 3.70* 4.12*  HGB 11.5* 10.9* 12.3*  HCT 35.3* 33.8* 38.4*  MCV 91.9 91.4 93.2  MCH 29.9  29.5 29.9  MCHC 32.6 32.2 32.0  RDW 16.0* 15.8* 15.7*  PLT 228 207 247    Cardiac Enzymes  Recent Labs Lab 01/10/17 1628 01/10/17 2230 01/11/17 0358 01/11/17 1033  TROPONINI 0.54* 0.39* 0.24* 0.18*   No results for input(s): TROPIPOC in the last 168 hours.   BNPNo results for input(s): BNP, PROBNP in the last 168 hours.   DDimer No results for input(s): DDIMER in the last 168 hours.   Radiology    No results found.  Cardiac Studies  LV EF: 60% -   65%  ------------------------------------------------------------------- History:   PMH:  Obesity. Assess LV Function.  Coronary artery disease.  Congestive heart failure.  Stroke.  Risk factors: Hypertension. Diabetes mellitus.  ------------------------------------------------------------------- Study Conclusions  - Left ventricle: The cavity size was normal. Wall thickness was   increased in a pattern of mild LVH. Systolic function was normal.   The estimated ejection fraction was in the range of 60% to 65%.   Wall motion was normal; there were no regional wall motion   abnormalities. Features are consistent  with a pseudonormal left   ventricular filling pattern, with concomitant abnormal relaxation   and increased filling pressure (grade 2 diastolic dysfunction). - Aortic valve: Moderately calcified annulus. - Mitral valve: Mildly calcified annulus. - Left atrium: The atrium was mildly dilated. - Right atrium: The atrium was mildly dilated. - Pulmonary arteries: Systolic pressure was mildly increased. PA   peak pressure: 35 mm Hg (S).  Impressions:  - The echo is of very poor quality - technically difficult.   The valves are not seen well at all.  Patient Profile     76 y.o. male CAD (MI ~2000 s/p PTCA of pLAD, NSTEMI 10/2012 with unsuccessful PCI, treated medically), hard of hearing, legally blind, cholelithiasis, CVA, DM c/b neuropathy, ED, HTN, h/o MRSA infection, h/o stomach ulcers, HLD, morbid obesity, OSA on CPAP, chronic leg edema and venous stasis on diuretics, paroxysmal atrial fib (pt refused anticoagulation) who presented with GI complaints and developed chest pain/tachycardia.   Assessment & Plan    1. Chest pain: developed a sudden onset of chest pressure and tachycardia yesterday afternoon after his BB and Imdur were held. These were resumed, no further episodes of chest pain reported.  -- Trop with a downward trend, likely demand ischemia in the setting of hypotension. He has known disease in the RCA and LAD territory with unsuccessful PCI attempts in the past.   -- continue ASA, plavix, statin for medical therapy. BB resumed yesterday, but blood pressures are soft this morning. May need to reduce dose, though he does have episodes of ST vs AT on telemetry. He is asymptomatic with these episodes. -- would not increase metoprolol d/t borderline hypotension and fatigue  2. Diarrhea: mosly resolved -management per hospitalist.  3. Chronic diastolic HF: no evidence for volume overload.  4. Hx of PAF: Refused anticoagulation in the past.   No further cardiac recommendations  at this time. D/w Dr. Sloan Leiter this am - he plans to d/c. Follow-up with Dr. Harrington Challenger as an outpatient.  Pixie Casino, MD, [facility] Attending Cardiologist [facility]  Pixie Casino, MD  01/13/2017, 9:31 AM

## 2017-01-13 NOTE — Consult Note (Signed)
   [facility] Inpatient Consult   01/13/2017  Charles [name] November 20, 1941 [id number]    Patient screened for potential [facility] Care Management services. Chart reviewed. Noted current discharge plan is for SNF.  There are no identifiable [facility] Care Management needs at this time. If patient's post hospital needs change, please place a [facility] Care Management consult. For questions please contact:  Marthenia Rolling, [place], RN,BSN [facility] Liaison [phone]

## 2017-01-14 ENCOUNTER — Encounter: Payer: Self-pay | Admitting: Internal Medicine

## 2017-01-14 ENCOUNTER — Non-Acute Institutional Stay (SKILLED_NURSING_FACILITY): Payer: Medicare Other | Admitting: Internal Medicine

## 2017-01-14 DIAGNOSIS — I959 Hypotension, unspecified: Secondary | ICD-10-CM | POA: Diagnosis not present

## 2017-01-14 DIAGNOSIS — I5033 Acute on chronic diastolic (congestive) heart failure: Secondary | ICD-10-CM | POA: Diagnosis not present

## 2017-01-14 DIAGNOSIS — E785 Hyperlipidemia, unspecified: Secondary | ICD-10-CM

## 2017-01-14 DIAGNOSIS — E1165 Type 2 diabetes mellitus with hyperglycemia: Secondary | ICD-10-CM

## 2017-01-14 DIAGNOSIS — I480 Paroxysmal atrial fibrillation: Secondary | ICD-10-CM | POA: Diagnosis not present

## 2017-01-14 DIAGNOSIS — E162 Hypoglycemia, unspecified: Secondary | ICD-10-CM

## 2017-01-14 DIAGNOSIS — E113399 Type 2 diabetes mellitus with moderate nonproliferative diabetic retinopathy without macular edema, unspecified eye: Secondary | ICD-10-CM | POA: Diagnosis not present

## 2017-01-14 DIAGNOSIS — Z794 Long term (current) use of insulin: Secondary | ICD-10-CM

## 2017-01-14 DIAGNOSIS — R197 Diarrhea, unspecified: Secondary | ICD-10-CM

## 2017-01-14 DIAGNOSIS — I2510 Atherosclerotic heart disease of native coronary artery without angina pectoris: Secondary | ICD-10-CM | POA: Diagnosis not present

## 2017-01-14 DIAGNOSIS — I471 Supraventricular tachycardia: Secondary | ICD-10-CM | POA: Diagnosis not present

## 2017-01-14 DIAGNOSIS — A09 Infectious gastroenteritis and colitis, unspecified: Secondary | ICD-10-CM | POA: Diagnosis not present

## 2017-01-14 NOTE — Progress Notes (Signed)
: Provider:  Noah Delaine. Charles Coil, MD Location:  [place] Room Number: [id number] Place of Service:  SNF [phone]  PCP: [name], MD Patient Care Team: Cassandria Anger, MD as PCP - [place], DPM as Consulting Physician (Podiatry)  Extended Emergency Contact Information Primary Emergency Contact: Turner,Frances Address: [address] [place], [place] [address] [name] of [place] Phone: [phone] Relation: Spouse Secondary Emergency Contact: Smith,Charlotte Address: [address]          [place], [place] [address] [name] of [employer] Phone: [phone] Relation: Daughter     Allergies: Xarelto [rivaroxaban]  Chief Complaint  Patient presents with  . New Admit To SNF    Admit to Facility    HPI: Patient is 76 y.o. male with h/o CAD, CVA, chronic diastolic Hf, paroxysmal atrial fibrillation not on anticoagulation, and T2DM insulin dependent. He presented to the ED with several day hx of progressive weakness, lethargy, recurrent falls and loose stools. He was found to be hypotensive and hypoglycemic. He was admitted to [facility] from 1/29-2/1 for treatment with IVF and change in diabetic regimen. Hospital course was complicated by brief atrial tachycardia, tx with metoprolol and diarrhea which resolved during hospitalization. Pt is admitted to SNF with generalized weakness for OT/PT. While at SNF pt will be followed for chronic diastolic CHF, tx with lasix and metoprolol, CAD, tx with ASA, plavix, metoprolol, imdur and statin and HLD, tx with pravachol.  Past Medical History:  Diagnosis Date  . Arthritis   . B12 deficiency   . CAD (coronary artery disease)    a. Approx. 2000 - MI. Cath: single vsl dz, PTCA dLAD/medical rx. ;  b. NSTEMI 11/13 => IVUS attempted for RCA but not successful; anatomy felt stable from 2000 => med Rx. c. [place] 08/2016: severe stable diffuse dLAD, severe prox RCA -> PCI of  RCA unsuccessful, consider CABG for refractory angina.  . Cancer ([place])    skin cancer- head   . Cataracts   . Cholelithiasis   . Chronic diastolic CHF (congestive heart failure) ([place])   . Claustrophobia   . CVA (cerebral vascular accident) ([place]) ~ 2001   vision inparted from stroke.  Visual Memory loss  . Diabetic neuropathy ([place])   . Diverticulitis   . Dysrhythmia    if he does not take Metoprolol- Afib  . ED (erectile dysfunction)   . Essential hypertension   . History of MRSA infection    Right foot  . History of stomach ulcers   . HOH (hard of hearing)   . Hypercholesterolemia   . Legally blind   . Lower extremity edema   . Morbid obesity ([place])   . Neuropathy ([place])   . OSA on CPAP   . PAF (paroxysmal atrial fibrillation) ([name])    a. confirmed by event monitor. b. 02/2014 rash on Coumadin, patient decided to discontinue Xarelto due to possible rash, cost and lawyers ads on TV, agreed to take Plavix.  . Pneumonia 2016  . Tunnel vision    Since stroke  . Type II diabetes mellitus ([place])   . Venous stasis   . Weakness 01/11/2017    Past Surgical History:  Procedure Laterality Date  . CARDIAC CATHETERIZATION  1990's  . CARDIAC CATHETERIZATION N/A 08/17/2016   Procedure: Left Heart Cath and Coronary Angiography;  Surgeon: Peter M Martinique, MD;  Location: [place] CV LAB;  Service: Cardiovascular;  Laterality: N/A;  . CARDIAC CATHETERIZATION N/A 08/19/2016   Procedure: Coronary Balloon Angioplasty;  Surgeon: Peter M Martinique, MD;  Location: [place] CV LAB;  Service: Cardiovascular;  Laterality: N/A;  . CATARACT EXTRACTION  10/2016  . CEREBRAL ANGIOGRAM  ~ 2000  . COLONOSCOPY    . LEFT HEART CATHETERIZATION WITH CORONARY ANGIOGRAM N/A 10/27/2012   Procedure: LEFT HEART CATHETERIZATION WITH CORONARY ANGIOGRAM;  Surgeon: Burnell Blanks, MD;  Location: [facility] CATH LAB;  Service: Cardiovascular;  Laterality: N/A;  . TOE AMPUTATION  2006; 2009   "Dr. Blenda Mounts; big toe left foot;  little toe on right foot" (10/26/2012)  . TOE AMPUTATION Right 12/24/2015   2nd & 3 toes/notes 1/13//2017  . TOE AMPUTATION Right    5TH TOE  . 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 Always use your most recent med list.          acetaminophen 500 MG tablet Commonly known as:  TYLENOL Take 1,000 mg by mouth at bedtime.   aspirin EC 81 MG tablet Take 81 mg by mouth daily.   cholecalciferol 1000 units tablet Commonly known as:  VITAMIN D Take 1,000 Units by mouth daily.   clopidogrel 75 MG tablet Commonly known as:  PLAVIX Take 1 tablet (75 mg total) by mouth daily.   collagenase ointment Commonly known as:  SANTYL Apply 1 application topically daily.   Fluticasone-Salmeterol 100-50 MCG/DOSE Aepb Commonly known as:  ADVAIR DISKUS Inhale 1 puff into the lungs 2 (two) times daily.   furosemide 80 MG tablet Commonly known as:  LASIX Take 1/2 tablet daily alternating with whole tablet daily   gabapentin 300 MG capsule Commonly known as:  NEURONTIN TAKE 1 CAPSULE THREE TIMES DAILY AS NEEDED FOR NERVE PAIN   insulin aspart 100 UNIT/ML injection Commonly known as:  novoLOG Inject 0-20 Units into the skin 3 (three) times daily before meals. First dose on Thu 01/13/17 at 0800 Correction coverage: Resistant (obese, steroids) CBG < 70: implement hypoglycemia protocol CBG 70 - 120: 0 units CBG 121 - 150: 2 units CBG 151 - 200: 4 units CBG 201 - 250: 7 units CBG 251 - 300: 9 units CBG 301 - 350: 12units CBG 351 - 400: 15 units CBG > 400: call MD   isosorbide mononitrate 30 MG 24 hr tablet Commonly known as:  IMDUR Take 1 tablet (30 mg total) by mouth daily.   losartan 100 MG tablet Commonly known as:   COZAAR Take 1 tablet (100 mg total) by mouth daily.   metoprolol tartrate 25 MG tablet Commonly known as:  LOPRESSOR Take 1 tablet (25 mg total) by mouth 2 (two) times daily.   nitroGLYCERIN 0.4 MG SL tablet Commonly known as:  NITROSTAT Place 1 tablet (0.4 mg total) under the tongue every 5 (five) minutes as needed (up to 3 doses). For chest pain   pioglitazone 45 MG tablet Commonly known as:  ACTOS Take 1 tablet (45 mg total) by mouth daily.   potassium chloride SA 20 MEQ tablet Commonly known as:  K-DUR,KLOR-CON Take 1 tablet (20 mEq total) by mouth daily.   pravastatin 40 MG tablet Commonly known  as:  PRAVACHOL Take 1 tablet (40 mg total) by mouth daily.   triamcinolone cream 0.5 % Commonly known as:  KENALOG Apply 1 application topically 2 (two) times daily as needed (rash).   vitamin B-12 1000 MCG tablet Commonly known as:  CYANOCOBALAMIN Take 1,000 mcg by mouth daily.       Meds ordered this encounter  Medications  . insulin aspart (NOVOLOG) 100 UNIT/ML injection    Sig: Inject 0-20 Units into the skin 3 (three) times daily before meals. First dose on Thu 01/13/17 at 0800 Correction coverage: Resistant (obese, steroids) CBG < 70: implement hypoglycemia protocol CBG 70 - 120: 0 units CBG 121 - 150: 2 units CBG 151 - 200: 4 units CBG 201 - 250: 7 units CBG 251 - 300: 9 units CBG 301 - 350: 12units CBG 351 - 400: 15 units CBG > 400: call MD    Immunization History  Administered Date(s) Administered  . Influenza Split 11/03/2011, 10/28/2012  . Influenza Whole 09/25/2008, 02/02/2010, 09/01/2010  . Influenza, High Dose Seasonal PF 11/13/2013, 10/21/2015, 09/01/2016  . Influenza,inj,Quad PF,36+ Mos 09/16/2014  . Pneumococcal Conjugate-13 10/21/2015  . Pneumococcal Polysaccharide-23 10/06/2005, 11/03/2011  . Td 04/22/2009    Social History  Substance Use Topics  . Smoking status: Former Smoker    Packs/day: 1.00    Years: 30.00    Types: Cigarettes,  Cigars  . Smokeless tobacco: Former Systems developer    Quit date: 08/15/2016     Comment: 12/2016 quit smoking in 1998  . Alcohol use Yes     Comment: 12/2016   rare     Family history is   Family History  Problem Relation Age of Onset  . Breast cancer Mother 58  . Heart disease Father 60  . Asthma Neg Hx       Review of Systems  DATA OBTAINED: from patient GENERAL:  no fevers, fatigue, appetite changes SKIN: No itching, or rash EYES: No eye pain, redness, discharge EARS: No earache, tinnitus, change in hearing NOSE: No congestion, drainage or bleeding  MOUTH/THROAT: No mouth or tooth pain, No sore throat RESPIRATORY: No cough, wheezing, SOB CARDIAC: No chest pain, palpitations, lower extremity edema  GI: No abdominal pain, No N/V or constipation, No heartburn or reflux ; C/O DIARRHEA , started in hospital gradually inc to 4 stools a day and now having 6 stools a day; pt admits he was on antibiotics for hois ankle before he was admitted to hospital. GU: No dysuria, frequency or urgency, or incontinence  MUSCULOSKELETAL: No unrelieved bone/joint pain NEUROLOGIC: No headache, dizziness or focal weakness PSYCHIATRIC: No c/o anxiety or sadness   Vitals:   01/14/17 0901  BP: 136/66  Pulse: 86  Resp: 20  Temp: 98.3 F (36.8 C)    SpO2 Readings from Last 1 Encounters:  01/14/17 96%   Body mass index is 43.45 kg/m.     Physical Exam  GENERAL APPEARANCE: Alert, conversant,  No acute distress. obeses WM  SKIN: No diaphoresis rash HEAD: Normocephalic, atraumatic  EYES: Conjunctiva/lids clear. Pupils round, reactive. EOMs intact.  EARS: External exam WNL, canals clear. Hearing grossly normal.  NOSE: No deformity or discharge.  MOUTH/THROAT: Lips w/o lesions  RESPIRATORY: Breathing is even, unlabored. Lung sounds are clear   CARDIOVASCULAR: Heart RRR no murmurs, rubs or gallops. No peripheral edema.   GASTROINTESTINAL: Abdomen is soft, non-tender, not distended w/ normal bowel  sounds. GENITOURINARY: Bladder non tender, not distended  MUSCULOSKELETAL: No abnormal joints or musculature NEUROLOGIC:  Cranial nerves 2-12 grossly intact. Moves all extremities  PSYCHIATRIC: Mood and affect appropriate to situation, no behavioral issues  Patient Active Problem List   Diagnosis Date Noted  . Hypoglycemia 01/11/2017  . Demand ischemia of myocardium ([place])   . Diarrhea 01/10/2017  . Elevated troponin 01/10/2017  . Dehydration 01/10/2017  . Hypoglycemia due to type 2 diabetes mellitus ([place]) 01/10/2017  . Hypoglycemia associated with diabetes ([place]) 10/17/2016  . Chronic ulcer of left foot ([place]) 08/31/2016  . Acute on chronic diastolic (congestive) heart failure 08/20/2016  . Venous stasis   . Lower extremity edema   . Hypercholesterolemia   . Diabetic neuropathy ([place])   . Unstable angina ([place]) 08/15/2016  . 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 Pressure ulcer 12/27/2015  . Gangrene of toe ([place]) 12/26/2015  . Acute renal failure (ARF) ([place]) 12/26/2015  . Subconjunctival hemorrhage 10/21/2015  . Cellulitis in diabetic foot ([place])   . Cellulitis 05/06/2015  . COPD exacerbation ([place]) 10/10/2014  . Hematuria 08/12/2014  . Hypokalemia 08/12/2014  . Urinary retention 08/03/2014  . Hypotension 07/26/2014  . COPD  07/26/2014  . Rash and nonspecific skin eruption 02/13/2014  . PAF (paroxysmal atrial fibrillation) ([place]) 11/13/2013  . Morbid obesity ([place]) 05/24/2012  . Type 2 diabetes, uncontrolled, with retinopathy ([place]) 02/09/2012  . NEOPLASM OF UNCERTAIN BEHAVIOR OF SKIN 12/24/2010  . WEIGHT GAIN, ABNORMAL 02/02/2010  . TOBACCO USE, QUIT 02/02/2010  . HYPOGONADISM 07/12/2007  . B12 deficiency 07/12/2007  . Essential hypertension 07/12/2007  . MYOCARDIAL INFARCTION, HX OF 07/12/2007  . CAD (coronary artery disease) 07/12/2007  . History of CVA (cerebrovascular accident) 07/12/2007  . VENOUS INSUFFICIENCY, LEGS 07/12/2007  . SYMPTOM, MEMORY LOSS 07/12/2007  .  DIVERTICULITIS, HX OF 07/12/2007      Labs reviewed: Basic Metabolic Panel:    Component Value Date/Time   NA 133 (L) 01/12/2017 0901   NA 133 (A) 01/12/2017   K 4.6 01/12/2017 0901   CL 101 01/12/2017 0901   CO2 23 01/12/2017 0901   GLUCOSE 194 (H) 01/12/2017 0901   GLUCOSE 136 (H) 12/22/2006 0827   BUN 26 (H) 01/12/2017 0901   BUN 26 (A) 01/12/2017   CREATININE 0.94 01/12/2017 0901   CREATININE 1.17 08/30/2016 1014   CALCIUM 9.0 01/12/2017 0901   PROT 5.5 (L) 01/11/2017 0358   ALBUMIN 2.5 (L) 01/11/2017 0358   AST 24 01/11/2017 0358   ALT 15 (L) 01/11/2017 0358   ALKPHOS 48 01/11/2017 0358   BILITOT 0.4 01/11/2017 0358   GFRNONAA >60 01/12/2017 0901   GFRAA >60 01/12/2017 0901     Recent Labs  10/18/16 0246  01/10/17 1628 01/11/17 0358 01/12/17 01/12/17 0901  NA 134*  < > 133* 136 133* 133*  K 5.1  < > 4.5 4.4  --  4.6  CL 101  < > 100* 103  --  101  CO2 25  < > 24 26  --  23  GLUCOSE 190*  < > 41* 112*  --  194*  BUN 15  < > 29* 23* 26* 26*  CREATININE 1.01  < > 1.19 0.87 0.9 0.94  CALCIUM 8.3*  < > 8.7* 8.6*  --  9.0  MG 1.9  --   --   --   --   --   < > = values in this interval not displayed. Liver Function Tests:  Recent Labs  10/16/16 2327 01/10/17 01/10/17 1628 01/11/17 0358  AST 24 36 36 24  ALT 11* 13  13* 15*  ALKPHOS 43 50 50 48  BILITOT 0.5  --  1.3* 0.4  PROT 5.6*  --  5.6* 5.5*  ALBUMIN 2.7*  --  2.8* 2.5*   No results for input(s): LIPASE, AMYLASE in the last 8760 hours. No results for input(s): AMMONIA in the last 8760 hours. CBC:  Recent Labs  10/18/16 0246 10/26/16 1411  01/10/17 1628 01/11/17 01/11/17 0358 01/12/17 01/12/17 0901  WBC 7.9 6.7  < > 18.2* 11.4 11.4* 12.6 12.6*  NEUTROABS 5.7 5.2  --  15.6*  --   --   --   --   HGB 10.0* 11.6*  < > 11.5* 10.9* 10.9*  --  12.3*  HCT 30.9* 35.3*  < > 35.3* 34* 33.8*  --  38.4*  MCV 96.3 93.2  --  91.9  --  91.4  --  93.2  PLT 201 395.0  < > 228 207 207  --  247  < > = values  in this interval not displayed. Lipid  Recent Labs  08/16/16 0448  CHOL 98  HDL 33*  LDLCALC 55  TRIG 49    Cardiac Enzymes:  Recent Labs  01/10/17 2230 01/11/17 0358 01/11/17 1033  TROPONINI 0.39* 0.24* 0.18*   BNP:  Recent Labs  01/30/16 1549 08/15/16 1135 08/30/16 1014  BNP 190.9* 336.5* 84.5   Lab Results  Component Value Date   MICROALBUR 1.4 08/28/2010   Lab Results  Component Value Date   HGBA1C 5.6 01/11/2017   Lab Results  Component Value Date   TSH 1.205 01/10/2017   Lab Results  Component Value Date   [id number] 01/08/2013   No results found for: FOLATE Lab Results  Component Value Date   IRON 47 09/12/2014    Imaging and Procedures obtained prior to SNF admission: Dg Chest 2 View  Result Date: 01/10/2017 CLINICAL DATA:  Acute onset of worsening generalized weakness. Initial encounter. EXAM: CHEST  2 VIEW COMPARISON:  Chest radiograph performed 10/17/2016 FINDINGS: The lungs are well-aerated. Mild vascular congestion is suggested. Mild left basilar atelectasis is seen. There is no evidence of pleural effusion or pneumothorax. The heart is mildly enlarged. No acute osseous abnormalities are seen. IMPRESSION: Mild vascular congestion and mild cardiomegaly. Mild left basilar atelectasis noted. Lungs otherwise clear. Electronically Signed   By: Garald Balding M.D.   On: 01/10/2017 17:26     Not all labs, radiology exams or other studies done during hospitalization come through on my EPIC note; however they are reviewed by me.    Assessment and Plan  HYPOGLYCEMIA/ DM2- recurrent issue (similar admission in November 2017) -likely due to strict glycemic control-his A1c is 5.6, not sure if patient has reliable oral intake. Given recurrent hypoglycemia would allow mild permissive hyperglycemia to prevent life-threatening episodes of hypoglycemia (CBG down to 34!).CBGs are more stable-plans are to continue with SSI, andActos. Patient does not  want to start long acting Insulin. SNF - admitted with generalized weakness for OT/PT; will cont actos 45 mg daily and acSSI  HYPOTENTION-initial blood pressure on presentation was 80/43, and lowest BP was 71/40 while in the emergency room, 2/2 poor po intake; Improved with IVF and stopping BP meds SNF - resume losartan 100 mg daily ,lasix 40 mg QOD, 80 QOD and metoprolol 25 mg BID  PAT - brief episode; resolved spontaneously SNF - cont metoprolol 25 mg BID   PAF- On metoprolol for rate control-not on anticoagulation by patient's choice.EKG 01/10/17 does not show atrial fibrillation  SNF - cont metoprolol for rate  CHRONIC DIASTOLIC CHF  SNF -  Cont lasix 40 qod/80 qod  CAD SNF - stable;Continue daily ASA, Plavix, Metoprolol, Imdur and statin. Continue heart healthy diet.   HLD SNF - cont parvachol 40 mg daily  DIARRHEA  SNF - per pt was going on in hospital , getting worse at  up to 6 times a day;admis he was on antibiotics an extended time for ankle prior to hospitalization; C diff was ordered LATE ENTRY - + c diff, believe first episode; have ordered vancomycin 500 mg q6 for 14 days   Time spent > 45 min;> 50% of time with patient was spent reviewing records, labs, tests and studies, counseling and developing plan of care  Charles Silversmith D. Charles Coil,  MD

## 2017-01-15 ENCOUNTER — Encounter: Payer: Self-pay | Admitting: Internal Medicine

## 2017-01-15 DIAGNOSIS — I4719 Other supraventricular tachycardia: Secondary | ICD-10-CM | POA: Insufficient documentation

## 2017-01-15 DIAGNOSIS — E785 Hyperlipidemia, unspecified: Secondary | ICD-10-CM | POA: Insufficient documentation

## 2017-01-15 DIAGNOSIS — I471 Supraventricular tachycardia: Secondary | ICD-10-CM | POA: Insufficient documentation

## 2017-01-15 LAB — CULTURE, BLOOD (ROUTINE X 2)
Culture: NO GROWTH
Culture: NO GROWTH

## 2017-01-18 NOTE — Telephone Encounter (Signed)
Thanks for the update

## 2017-01-19 ENCOUNTER — Non-Acute Institutional Stay (SKILLED_NURSING_FACILITY): Payer: Medicare Other | Admitting: Internal Medicine

## 2017-01-19 ENCOUNTER — Encounter: Payer: Self-pay | Admitting: Internal Medicine

## 2017-01-19 DIAGNOSIS — Z87898 Personal history of other specified conditions: Secondary | ICD-10-CM | POA: Diagnosis not present

## 2017-01-19 DIAGNOSIS — Z794 Long term (current) use of insulin: Secondary | ICD-10-CM

## 2017-01-19 DIAGNOSIS — E1142 Type 2 diabetes mellitus with diabetic polyneuropathy: Secondary | ICD-10-CM | POA: Diagnosis not present

## 2017-01-19 DIAGNOSIS — I5032 Chronic diastolic (congestive) heart failure: Secondary | ICD-10-CM | POA: Diagnosis not present

## 2017-01-19 DIAGNOSIS — G2581 Restless legs syndrome: Secondary | ICD-10-CM

## 2017-01-19 DIAGNOSIS — Z8669 Personal history of other diseases of the nervous system and sense organs: Secondary | ICD-10-CM

## 2017-01-19 NOTE — Progress Notes (Signed)
Location:  [facility] Room Number: [id number] Place of Service:  SNF (31)  Charles [name]. Charles Coil, MD  Patient Care Team: Cassandria Anger, MD as PCP - [place], DPM as Consulting Physician [facility])  Extended Emergency Contact Information Primary Emergency Contact: Charles [name] Address: [address] [place], [place] [address] [name] of [employer] Phone: [phone] Relation: Spouse Secondary Emergency Contact: Charles [name] Address: [address]          [place], [place] [address] [name] of [employer] Phone: [phone] Relation: Daughter    Allergies: Xarelto [rivaroxaban]  Chief Complaint  Patient presents with  . Acute Visit    Acute    HPI: Patient is 76 y.o. male who I am seeing acutely today because he has multiple concerns about his medications.  Past Medical History:  Diagnosis Date  . Arthritis   . B12 deficiency   . CAD (coronary artery disease)    a. Approx. 2000 - MI. Cath: single vsl dz, PTCA dLAD/medical rx. ;  b. NSTEMI 11/13 => IVUS attempted for RCA but not successful; anatomy felt stable from 2000 => med Rx. c. [place] 08/2016: severe stable diffuse dLAD, severe prox RCA -> PCI of RCA unsuccessful, consider CABG for refractory angina.  . Cancer ([place])    skin cancer- head   . Cataracts   . Cholelithiasis   . Chronic diastolic CHF (congestive heart failure) ([place])   . Claustrophobia   . CVA (cerebral vascular accident) ([place]) ~ 2001   vision inparted from stroke.  Visual Memory loss  . Diabetic neuropathy ([place])   . Diverticulitis   . Dysrhythmia    if he does not take Metoprolol- Afib  . ED (erectile dysfunction)   . Essential hypertension   . History of MRSA infection    Right foot  . History of stomach ulcers   . HOH (hard of hearing)   . Hypercholesterolemia   . Legally blind   . Lower extremity edema   . Morbid obesity ([place])   . Neuropathy ([place])   . OSA  on CPAP   . PAF (paroxysmal atrial fibrillation) ([name])    a. confirmed by event monitor. b. 02/2014 rash on Coumadin, patient decided to discontinue Xarelto due to possible rash, cost and lawyers ads on TV, agreed to take Plavix.  . Pneumonia 2016  . Tunnel vision    Since stroke  . Type II diabetes mellitus ([place])   . Venous stasis   . Weakness 01/11/2017    Past Surgical History:  Procedure Laterality Date  . CARDIAC CATHETERIZATION  1990's  . CARDIAC CATHETERIZATION N/A 08/17/2016   Procedure: Left Heart Cath and Coronary Angiography;  Surgeon: Peter M Martinique, MD;  Location: [facility] CV LAB;  Service: Cardiovascular;  Laterality: N/A;  . CARDIAC CATHETERIZATION N/A 08/19/2016   Procedure: Coronary Balloon Angioplasty;  Surgeon: Peter M Martinique, MD;  Location: [facility];  Service: Cardiovascular;  Laterality: N/A;  . CATARACT EXTRACTION  10/2016  . CEREBRAL ANGIOGRAM  ~ 2000  . COLONOSCOPY    . LEFT HEART CATHETERIZATION WITH CORONARY ANGIOGRAM N/A 10/27/2012   Procedure: LEFT HEART CATHETERIZATION WITH CORONARY ANGIOGRAM;  Surgeon: Burnell Blanks, MD;  Location: [facility] CATH LAB;  Service: Cardiovascular;  Laterality: N/A;  . TOE AMPUTATION  2006; 2009   "Dr. Blenda Mounts; big toe left foot; little toe on right foot" (10/26/2012)  . TOE  AMPUTATION Right 12/24/2015   2nd & 3 toes/notes 1/13//2017  . TOE AMPUTATION Right    5TH TOE  . 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 Always use your most recent med list.          acetaminophen 500 MG tablet Commonly known as:  TYLENOL Take 1,000 mg by mouth at bedtime.   aspirin EC 81 MG tablet Take 81 mg by mouth daily.   cholecalciferol 1000 units  tablet Commonly known as:  VITAMIN D Take 1,000 Units by mouth daily.   clopidogrel 75 MG tablet Commonly known as:  PLAVIX Take 1 tablet (75 mg total) by mouth daily.   collagenase ointment Commonly known as:  SANTYL Apply 1 application topically daily.   feeding supplement (PRO-STAT SUGAR FREE 64) Liqd Take 30 mLs by mouth 2 (two) times daily.   Fluticasone-Salmeterol 100-50 MCG/DOSE Aepb Commonly known as:  ADVAIR DISKUS Inhale 1 puff into the lungs 2 (two) times daily.   furosemide 80 MG tablet Commonly known as:  LASIX Take 1/2 tablet daily alternating with whole tablet daily   ibuprofen 800 MG tablet Commonly known as:  ADVIL,MOTRIN Take 800 mg by mouth daily as needed for mild pain or moderate pain.   insulin aspart 100 UNIT/ML injection Commonly known as:  novoLOG Inject 30 Units into the skin 4 (four) times daily - after meals and at bedtime. First dose on Thu 01/13/17 at 0800 Correction coverage: Resistant (obese, steroids) CBG < 70: implement hypoglycemia protocol CBG 70 - 120: 0 units CBG 121 - 150: 2 units CBG 151 - 200: 4 units CBG 201 - 250: 7 units CBG 251 - 300: 9 units CBG 301 - 350: 12units CBG 351 - 400: 15 units CBG > 400: call MD   isosorbide mononitrate 30 MG 24 hr tablet Commonly known as:  IMDUR Take 1 tablet (30 mg total) by mouth daily.   losartan 100 MG tablet Commonly known as:  COZAAR Take 1 tablet (100 mg total) by mouth daily.   metFORMIN 500 MG tablet Commonly known as:  GLUCOPHAGE Take 500 mg by mouth 2 (two) times daily with a meal.   metoprolol tartrate 25 MG tablet Commonly known as:  LOPRESSOR Take 1 tablet (25 mg total) by mouth 2 (two) times daily.   NEURONTIN 300 MG capsule Generic drug:  gabapentin Take 300 mg by mouth 2 (two) times daily. scheduled   gabapentin 300 MG capsule Commonly known as:  NEURONTIN Take 300 mg by mouth 3 (three) times daily as needed.   nitroGLYCERIN 0.4 MG SL tablet Commonly known as:   NITROSTAT Place 1 tablet (0.4 mg total) under the tongue every 5 (five) minutes as needed (up to 3 doses). For chest pain   oseltamivir 75 MG capsule Commonly known as:  TAMIFLU Take 75 mg by mouth daily.   pioglitazone 45 MG tablet Commonly known as:  ACTOS Take 1 tablet (45 mg total) by mouth daily.   potassium chloride SA 20 MEQ tablet Commonly known as:  K-DUR,KLOR-CON Take 1 tablet (20 mEq total) by mouth daily.   pravastatin 40 MG tablet Commonly known as:  PRAVACHOL Take 1 tablet (  40 mg total) by mouth daily.   triamcinolone cream 0.5 % Commonly known as:  KENALOG Apply 1 application topically 2 (two) times daily as needed (rash).   vancomycin 250 MG capsule Commonly known as:  VANCOCIN Take 500 mg by mouth every 6 (six) hours. 2 caps   vitamin B-12 1000 MCG tablet Commonly known as:  CYANOCOBALAMIN Take 1,000 mcg by mouth daily.       Meds ordered this encounter  Medications  . vancomycin (VANCOCIN) 250 MG capsule    Sig: Take 500 mg by mouth every 6 (six) hours. 2 caps  . Amino Acids-Protein Hydrolys (FEEDING SUPPLEMENT, PRO-STAT SUGAR FREE 64,) LIQD    Sig: Take 30 mLs by mouth 2 (two) times daily.  Marland Kitchen oseltamivir (TAMIFLU) 75 MG capsule    Sig: Take 75 mg by mouth daily.  . metFORMIN (GLUCOPHAGE) 500 MG tablet    Sig: Take 500 mg by mouth 2 (two) times daily with a meal.  . ibuprofen (ADVIL,MOTRIN) 800 MG tablet    Sig: Take 800 mg by mouth daily as needed for mild pain or moderate pain.  Marland Kitchen gabapentin (NEURONTIN) 300 MG capsule    Sig: Take 300 mg by mouth 2 (two) times daily. scheduled  . gabapentin (NEURONTIN) 300 MG capsule    Sig: Take 300 mg by mouth 3 (three) times daily as needed.    Immunization History  Administered Date(s) Administered  . Influenza Split 11/03/2011, 10/28/2012  . Influenza Whole 09/25/2008, 02/02/2010, 09/01/2010  . Influenza, High Dose Seasonal PF 11/13/2013, 10/21/2015, 09/01/2016  . Influenza,inj,Quad PF,36+ Mos  09/16/2014  . PPD Test 01/13/2017  . Pneumococcal Conjugate-13 10/21/2015  . Pneumococcal Polysaccharide-23 10/06/2005, 11/03/2011  . Td 04/22/2009    Social History  Substance Use Topics  . Smoking status: Former Smoker    Packs/day: 1.00    Years: 30.00    Types: Cigarettes, Cigars  . Smokeless tobacco: Former Systems developer    Quit date: 08/15/2016     Comment: 12/2016 quit smoking in 1998  . Alcohol use Yes     Comment: 12/2016   rare     Review of Systems  DATA OBTAINED: from patient GENERAL:  no fevers, fatigue, appetite changes SKIN: No itching, rash HEENT: No complaint RESPIRATORY: No cough, wheezing, SOB CARDIAC: No chest pain, palpitations, lower extremity edema  GI: No abdominal pain, No N/V/D or constipation, No heartburn or reflux  GU: No dysuria, frequency or urgency, or incontinence  MUSCULOSKELETAL: No unrelieved bone/joint pain NEUROLOGIC: No headache, dizziness;RESTLESS LEGS; tension HA's  PSYCHIATRIC: No overt anxiety or sadness  Vitals:   01/19/17 1534  BP: 130/72  Pulse: 80  Resp: 20  Temp: 97.2 F (36.2 C)   Body mass index is 42.42 kg/m. Physical Exam  GENERAL APPEARANCE: Alert, conversant, No acute distress , obese SKIN: No diaphoresis rash HEENT: Unremarkable RESPIRATORY: Breathing is even, unlabored. Lung sounds are clear   CARDIOVASCULAR: Heart RRR no murmurs, rubs or gallops. Trace to 1+ peripheral edema  GASTROINTESTINAL: Abdomen is soft, non-tender, not distended w/ normal bowel sounds.  GENITOURINARY: Bladder non tender, not distended  MUSCULOSKELETAL: No abnormal joints or musculature NEUROLOGIC: Cranial nerves 2-12 grossly intact. Moves all extremities PSYCHIATRIC: Mood and affect appropriate to situation, no behavioral issues  Patient Active Problem List   Diagnosis Date Noted  . PAT (paroxysmal atrial tachycardia) ([place]) 01/15/2017  . HLD (hyperlipidemia) 01/15/2017  . Hypoglycemia 01/11/2017  . Demand ischemia of myocardium ([place])   .  Diarrhea 01/10/2017  .  Elevated troponin 01/10/2017  . Dehydration 01/10/2017  . Hypoglycemia due to type 2 diabetes mellitus ([place]) 01/10/2017  . Hypoglycemia associated with diabetes ([place]) 10/17/2016  . Chronic ulcer of left foot ([place]) 08/31/2016  . Acute on chronic diastolic congestive heart failure ([place]) 08/20/2016  . Venous stasis   . Lower extremity edema   . Hypercholesterolemia   . Diabetic neuropathy ([place])   . Unstable angina ([place]) 08/15/2016  . 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 Pressure ulcer 12/27/2015  . Gangrene of toe ([place]) 12/26/2015  . Acute renal failure (ARF) ([name]) 12/26/2015  . Subconjunctival hemorrhage 10/21/2015  . Cellulitis in diabetic foot ([place])   . Cellulitis 05/06/2015  . COPD exacerbation ([place]) 10/10/2014  . Hematuria 08/12/2014  . Hypokalemia 08/12/2014  . Urinary retention 08/03/2014  . Hypotension 07/26/2014  . COPD  07/26/2014  . Rash and nonspecific skin eruption 02/13/2014  . PAF (paroxysmal atrial fibrillation) ([place]) 11/13/2013  . Morbid obesity ([place]) 05/24/2012  . Type 2 diabetes, uncontrolled, with retinopathy ([place]) 02/09/2012  . NEOPLASM OF UNCERTAIN BEHAVIOR OF SKIN 12/24/2010  . WEIGHT GAIN, ABNORMAL 02/02/2010  . TOBACCO USE, QUIT 02/02/2010  . 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 HYPOGONADISM 07/12/2007  . B12 deficiency 07/12/2007  . Essential hypertension 07/12/2007  . MYOCARDIAL INFARCTION, HX OF 07/12/2007  . CAD (coronary artery disease) 07/12/2007  . History of CVA (cerebrovascular accident) 07/12/2007  . VENOUS INSUFFICIENCY, LEGS 07/12/2007  . SYMPTOM, MEMORY LOSS 07/12/2007  . DIVERTICULITIS, HX OF 07/12/2007    CMP     Component Value Date/Time   NA 133 (L) 01/12/2017 0901   NA 133 (A) 01/12/2017   K 4.6 01/12/2017 0901   CL 101 01/12/2017 0901   CO2 23 01/12/2017 0901   GLUCOSE 194 (H) 01/12/2017 0901   GLUCOSE 136 (H) 12/22/2006 0827   BUN 26 (H) 01/12/2017 0901   BUN 26 (A) 01/12/2017    CREATININE 0.94 01/12/2017 0901   CREATININE 1.17 08/30/2016 1014   CALCIUM 9.0 01/12/2017 0901   PROT 5.5 (L) 01/11/2017 0358   ALBUMIN 2.5 (L) 01/11/2017 0358   AST 24 01/11/2017 0358   ALT 15 (L) 01/11/2017 0358   ALKPHOS 48 01/11/2017 0358   BILITOT 0.4 01/11/2017 0358   GFRNONAA >60 01/12/2017 0901   GFRAA >60 01/12/2017 0901    Recent Labs  10/18/16 0246  01/10/17 1628 01/11/17 0358 01/12/17 01/12/17 0901  NA 134*  < > 133* 136 133* 133*  K 5.1  < > 4.5 4.4  --  4.6  CL 101  < > 100* 103  --  101  CO2 25  < > 24 26  --  23  GLUCOSE 190*  < > 41* 112*  --  194*  BUN 15  < > 29* 23* 26* 26*  CREATININE 1.01  < > 1.19 0.87 0.9 0.94  CALCIUM 8.3*  < > 8.7* 8.6*  --  9.0  MG 1.9  --   --   --   --   --   < > = values in this interval not displayed.  Recent Labs  10/16/16 2327 01/10/17 01/10/17 1628 01/11/17 0358  AST 24 36 36 24  ALT 11* 13 13* 15*  ALKPHOS 43 50 50 48  BILITOT 0.5  --  1.3* 0.4  PROT 5.6*  --  5.6* 5.5*  ALBUMIN 2.7*  --  2.8* 2.5*    Recent Labs  10/18/16 0246 10/26/16 1411  01/10/17 1628  01/11/17 01/11/17 0358 01/12/17 01/12/17 0901  WBC 7.9 6.7  < > 18.2* 11.4 11.4* 12.6 12.6*  NEUTROABS 5.7 5.2  --  15.6*  --   --   --   --   HGB 10.0* 11.6*  < > 11.5* 10.9* 10.9*  --  12.3*  HCT 30.9* 35.3*  < > 35.3* 34* 33.8*  --  38.4*  MCV 96.3 93.2  --  91.9  --  91.4  --  93.2  PLT 201 395.0  < > 228 207 207  --  247  < > = values in this interval not displayed.  Recent Labs  08/16/16 0448  CHOL 98  LDLCALC 55  TRIG 49   Lab Results  Component Value Date   MICROALBUR 1.4 08/28/2010   Lab Results  Component Value Date   TSH 1.205 01/10/2017   Lab Results  Component Value Date   HGBA1C 5.6 01/11/2017   Lab Results  Component Value Date   CHOL 98 08/16/2016   HDL 33 (L) 08/16/2016   LDLCALC 55 08/16/2016   TRIG 49 08/16/2016   CHOLHDL 3.0 08/16/2016    Significant Diagnostic Results in last 30 days:  Dg Chest 2  View  Result Date: 01/10/2017 CLINICAL DATA:  Acute onset of worsening generalized weakness. Initial encounter. EXAM: CHEST  2 VIEW COMPARISON:  Chest radiograph performed 10/17/2016 FINDINGS: The lungs are well-aerated. Mild vascular congestion is suggested. Mild left basilar atelectasis is seen. There is no evidence of pleural effusion or pneumothorax. The heart is mildly enlarged. No acute osseous abnormalities are seen. IMPRESSION: Mild vascular congestion and mild cardiomegaly. Mild left basilar atelectasis noted. Lungs otherwise clear. Electronically Signed   By: Garald Balding M.D.   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 35 min Charles [name]. Charles Coil, MD

## 2017-01-24 ENCOUNTER — Non-Acute Institutional Stay (SKILLED_NURSING_FACILITY): Payer: Medicare Other | Admitting: Internal Medicine

## 2017-01-24 ENCOUNTER — Encounter: Payer: Self-pay | Admitting: Internal Medicine

## 2017-01-24 DIAGNOSIS — S90821A Blister (nonthermal), right foot, initial encounter: Secondary | ICD-10-CM

## 2017-01-24 DIAGNOSIS — L03115 Cellulitis of right lower limb: Secondary | ICD-10-CM

## 2017-01-24 NOTE — Progress Notes (Signed)
Location:  [facility] Room Number: [id number] Place of Service:  SNF (31)  Charles [name]. Sheppard Coil, MD  Patient Care Team: Cassandria Anger, MD as PCP - [place], DPM as Consulting Physician [facility])  Extended Emergency Contact Information Primary Emergency Contact: Currier,Frances Address: [address] [place], [place] [address] [name] of [place] Phone: [phone] Relation: Spouse Secondary Emergency Contact: Smith,Charlotte Address: [address]          [place], [place] [address] [name] of [employer] Phone: [phone] Relation: Daughter    Allergies: Xarelto [rivaroxaban]  Chief Complaint  Patient presents with  . Acute Visit    Acute    HPI: Patient is 76 y.o. male who nursing asked me to see for a new blister on pt's R heel.Pt thinks it may have been there fora day.Admits some pain. No fever.  Past Medical History:  Diagnosis Date  . Arthritis   . B12 deficiency   . CAD (coronary artery disease)    a. Approx. 2000 - MI. Cath: single vsl dz, PTCA dLAD/medical rx. ;  b. NSTEMI 11/13 => IVUS attempted for RCA but not successful; anatomy felt stable from 2000 => med Rx. c. [place] 08/2016: severe stable diffuse dLAD, severe prox RCA -> PCI of RCA unsuccessful, consider CABG for refractory angina.  . Cancer ([place])    skin cancer- head   . Cataracts   . Cholelithiasis   . Chronic diastolic CHF (congestive heart failure) ([place])   . Claustrophobia   . CVA (cerebral vascular accident) ([place]) ~ 2001   vision inparted from stroke.  Visual Memory loss  . Diabetic neuropathy ([place])   . Diverticulitis   . Dysrhythmia    if he does not take Metoprolol- Afib  . ED (erectile dysfunction)   . Essential hypertension   . History of MRSA infection    Right foot  . History of stomach ulcers   . HOH (hard of hearing)   . Hypercholesterolemia   . Legally blind   . Lower extremity edema   . Morbid  obesity ([place])   . Neuropathy ([place])   . OSA on CPAP   . PAF (paroxysmal atrial fibrillation) ([name])    a. confirmed by event monitor. b. 02/2014 rash on Coumadin, patient decided to discontinue Xarelto due to possible rash, cost and lawyers ads on TV, agreed to take Plavix.  . Pneumonia 2016  . Tunnel vision    Since stroke  . Type II diabetes mellitus ([place])   . Venous stasis   . Weakness 01/11/2017    Past Surgical History:  Procedure Laterality Date  . CARDIAC CATHETERIZATION  1990's  . CARDIAC CATHETERIZATION N/A 08/17/2016   Procedure: Left Heart Cath and Coronary Angiography;  Surgeon: Peter M Martinique, MD;  Location: [facility] CV LAB;  Service: Cardiovascular;  Laterality: N/A;  . CARDIAC CATHETERIZATION N/A 08/19/2016   Procedure: Coronary Balloon Angioplasty;  Surgeon: Peter M Martinique, MD;  Location: [facility] CV LAB;  Service: Cardiovascular;  Laterality: N/A;  . CATARACT EXTRACTION  10/2016  . CEREBRAL ANGIOGRAM  ~ 2000  . COLONOSCOPY    . LEFT HEART CATHETERIZATION WITH CORONARY ANGIOGRAM N/A 10/27/2012   Procedure: LEFT HEART CATHETERIZATION WITH CORONARY ANGIOGRAM;  Surgeon: Burnell Blanks, MD;  Location: [facility] CATH LAB;  Service: Cardiovascular;  Laterality: N/A;  . TOE AMPUTATION  2006; 2009   "Dr. Blenda Mounts; big  toe left foot; little toe on right foot" (10/26/2012)  . TOE AMPUTATION Right 12/24/2015   2nd & 3 toes/notes 1/13//2017  . TOE AMPUTATION Right    5TH TOE  . 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 Always use your most recent med list.          acetaminophen 500 MG tablet Commonly known as:  TYLENOL Take 1,000 mg by mouth at bedtime.   aspirin EC 81 MG tablet Take 81 mg by mouth  daily.   cholecalciferol 1000 units tablet Commonly known as:  VITAMIN D Take 1,000 Units by mouth daily.   clopidogrel 75 MG tablet Commonly known as:  PLAVIX Take 1 tablet (75 mg total) by mouth daily.   collagenase ointment Commonly known as:  SANTYL Apply 1 application topically daily.   feeding supplement (PRO-STAT SUGAR FREE 64) Liqd Take 30 mLs by mouth 2 (two) times daily.   Fluticasone-Salmeterol 100-50 MCG/DOSE Aepb Commonly known as:  ADVAIR DISKUS Inhale 1 puff into the lungs 2 (two) times daily.   furosemide 80 MG tablet Commonly known as:  LASIX Take 1/2 tablet daily alternating with whole tablet daily   ibuprofen 800 MG tablet Commonly known as:  ADVIL,MOTRIN Take 800 mg by mouth daily as needed for mild pain or moderate pain.   insulin aspart 100 UNIT/ML injection Commonly known as:  novoLOG Inject 30 Units into the skin 4 (four) times daily - after meals and at bedtime. First dose on Thu 01/13/17 at 0800 Correction coverage: Resistant (obese, steroids) CBG < 70: implement hypoglycemia protocol CBG 70 - 120: 0 units CBG 121 - 150: 2 units CBG 151 - 200: 4 units CBG 201 - 250: 7 units CBG 251 - 300: 9 units CBG 301 - 350: 12units CBG 351 - 400: 15 units CBG > 400: call MD   isosorbide mononitrate 30 MG 24 hr tablet Commonly known as:  IMDUR Take 1 tablet (30 mg total) by mouth daily.   losartan 100 MG tablet Commonly known as:  COZAAR Take 1 tablet (100 mg total) by mouth daily.   metFORMIN 500 MG tablet Commonly known as:  GLUCOPHAGE Take 500 mg by mouth 2 (two) times daily with a meal.   metoprolol tartrate 25 MG tablet Commonly known as:  LOPRESSOR Take 1 tablet (25 mg total) by mouth 2 (two) times daily.   NEURONTIN 300 MG capsule Generic drug:  gabapentin Take 300 mg by mouth 2 (two) times daily. scheduled   gabapentin 300 MG capsule Commonly known as:  NEURONTIN Take 300 mg by mouth 3 (three) times daily as needed.   nitroGLYCERIN 0.4 MG SL  tablet Commonly known as:  NITROSTAT Place 1 tablet (0.4 mg total) under the tongue every 5 (five) minutes as needed (up to 3 doses). For chest pain   oseltamivir 75 MG capsule Commonly known as:  TAMIFLU Take 75 mg by mouth daily.   pioglitazone 45 MG tablet Commonly known as:  ACTOS Take 1 tablet (45 mg total) by mouth daily.   potassium chloride SA 20 MEQ tablet Commonly known as:  K-DUR,KLOR-CON Take 1 tablet (20 mEq total) by mouth daily.  pravastatin 40 MG tablet Commonly known as:  PRAVACHOL Take 1 tablet (40 mg total) by mouth daily.   triamcinolone cream 0.5 % Commonly known as:  KENALOG Apply 1 application topically 2 (two) times daily as needed (rash).   vancomycin 250 MG capsule Commonly known as:  VANCOCIN Take 500 mg by mouth every 6 (six) hours. 2 caps   vitamin B-12 1000 MCG tablet Commonly known as:  CYANOCOBALAMIN Take 1,000 mcg by mouth daily.       No orders of the defined types were placed in this encounter.   Immunization History  Administered Date(s) Administered  . Influenza Split 11/03/2011, 10/28/2012  . Influenza Whole 09/25/2008, 02/02/2010, 09/01/2010  . Influenza, High Dose Seasonal PF 11/13/2013, 10/21/2015, 09/01/2016  . Influenza,inj,Quad PF,36+ Mos 09/16/2014  . PPD Test 01/13/2017  . Pneumococcal Conjugate-13 10/21/2015  . Pneumococcal Polysaccharide-23 10/06/2005, 11/03/2011  . Td 04/22/2009    Social History  Substance Use Topics  . Smoking status: Former Smoker    Packs/day: 1.00    Years: 30.00    Types: Cigarettes, Cigars  . Smokeless tobacco: Former Systems developer    Quit date: 08/15/2016     Comment: 12/2016 quit smoking in 1998  . Alcohol use Yes     Comment: 12/2016   rare     Review of Systems  DATA OBTAINED: from patient, nurse, medical record, family member GENERAL:  no fevers, fatigue, appetite changes SKIN: blister R heel HEENT: No complaint RESPIRATORY: No cough, wheezing, SOB CARDIAC: No chest pain,  palpitations, lower extremity edema  GI: No abdominal pain, No N/V/D or constipation, No heartburn or reflux  GU: No dysuria, frequency or urgency, or incontinence  MUSCULOSKELETAL: No unrelieved bone/joint pain NEUROLOGIC: No headache, dizziness  PSYCHIATRIC: No overt anxiety or sadness  Vitals:   01/24/17 1502  BP: (!) 129/54  Pulse: 62  Resp: 20  Temp: 98.6 F (37 C)   Body mass index is 42.42 kg/m. Physical Exam  GENERAL APPEARANCE: Alert, conversant, No acute distress  SKIN: blister R heel with radiating streaks of redness from it, with heat; aslo some inc heat and redness ant calf HEENT: Unremarkable RESPIRATORY: Breathing is even, unlabored. Lung sounds are clear   CARDIOVASCULAR: Heart RRR no murmurs, rubs or gallops. No peripheral edema  GASTROINTESTINAL: Abdomen is soft, non-tender, not distended w/ normal bowel sounds.  GENITOURINARY: Bladder non tender, not distended  MUSCULOSKELETAL: No abnormal joints or musculature NEUROLOGIC: Cranial nerves 2-12 grossly intact. Moves all extremities PSYCHIATRIC: Mood and affect appropriate to situation, no behavioral issues  Patient Active Problem List   Diagnosis Date Noted  . PAT (paroxysmal atrial tachycardia) ([place]) 01/15/2017  . HLD (hyperlipidemia) 01/15/2017  . Hypoglycemia 01/11/2017  . Demand ischemia of myocardium ([place])   . Diarrhea 01/10/2017  . Elevated troponin 01/10/2017  . Dehydration 01/10/2017  . Hypoglycemia due to type 2 diabetes mellitus ([place]) 01/10/2017  . Hypoglycemia associated with diabetes ([place]) 10/17/2016  . Chronic ulcer of left foot ([place]) 08/31/2016  . Acute on chronic diastolic congestive heart failure ([place]) 08/20/2016  . Venous stasis   . Lower extremity edema   . Hypercholesterolemia   . Diabetic neuropathy ([place])   . Unstable angina ([place]) 08/15/2016  . 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 Pressure ulcer 12/27/2015  . Gangrene of toe ([place]) 12/26/2015  . Acute renal failure (ARF) ([place])  12/26/2015  . Subconjunctival hemorrhage 10/21/2015  . Cellulitis in diabetic foot ([place])   . Cellulitis 05/06/2015  . COPD exacerbation ([place]) 10/10/2014  .  Hematuria 08/12/2014  . Hypokalemia 08/12/2014  . Urinary retention 08/03/2014  . Hypotension 07/26/2014  . COPD  07/26/2014  . Rash and nonspecific skin eruption 02/13/2014  . PAF (paroxysmal atrial fibrillation) ([place]) 11/13/2013  . Morbid obesity ([place]) 05/24/2012  . Type 2 diabetes, uncontrolled, with retinopathy ([place]) 02/09/2012  . NEOPLASM OF UNCERTAIN BEHAVIOR OF SKIN 12/24/2010  . WEIGHT GAIN, ABNORMAL 02/02/2010  . TOBACCO USE, QUIT 02/02/2010  . 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 HYPOGONADISM 07/12/2007  . B12 deficiency 07/12/2007  . Essential hypertension 07/12/2007  . MYOCARDIAL INFARCTION, HX OF 07/12/2007  . CAD (coronary artery disease) 07/12/2007  . History of CVA (cerebrovascular accident) 07/12/2007  . VENOUS INSUFFICIENCY, LEGS 07/12/2007  . SYMPTOM, MEMORY LOSS 07/12/2007  . DIVERTICULITIS, HX OF 07/12/2007    CMP     Component Value Date/Time   NA 133 (L) 01/12/2017 0901   NA 133 (A) 01/12/2017   K 4.6 01/12/2017 0901   CL 101 01/12/2017 0901   CO2 23 01/12/2017 0901   GLUCOSE 194 (H) 01/12/2017 0901   GLUCOSE 136 (H) 12/22/2006 0827   BUN 26 (H) 01/12/2017 0901   BUN 26 (A) 01/12/2017   CREATININE 0.94 01/12/2017 0901   CREATININE 1.17 08/30/2016 1014   CALCIUM 9.0 01/12/2017 0901   PROT 5.5 (L) 01/11/2017 0358   ALBUMIN 2.5 (L) 01/11/2017 0358   AST 24 01/11/2017 0358   ALT 15 (L) 01/11/2017 0358   ALKPHOS 48 01/11/2017 0358   BILITOT 0.4 01/11/2017 0358   GFRNONAA >60 01/12/2017 0901   GFRAA >60 01/12/2017 0901    Recent Labs  10/18/16 0246  01/10/17 1628 01/11/17 0358 01/12/17 01/12/17 0901  NA 134*  < > 133* 136 133* 133*  K 5.1  < > 4.5 4.4  --  4.6  CL 101  < > 100* 103  --  101  CO2 25  < > 24 26  --  23  GLUCOSE 190*  < > 41* 112*  --  194*  BUN  15  < > 29* 23* 26* 26*  CREATININE 1.01  < > 1.19 0.87 0.9 0.94  CALCIUM 8.3*  < > 8.7* 8.6*  --  9.0  MG 1.9  --   --   --   --   --   < > = values in this interval not displayed.  Recent Labs  10/16/16 2327 01/10/17 01/10/17 1628 01/11/17 0358  AST 24 36 36 24  ALT 11* 13 13* 15*  ALKPHOS 43 50 50 48  BILITOT 0.5  --  1.3* 0.4  PROT 5.6*  --  5.6* 5.5*  ALBUMIN 2.7*  --  2.8* 2.5*    Recent Labs  10/18/16 0246 10/26/16 1411  01/10/17 1628 01/11/17 01/11/17 0358 01/12/17 01/12/17 0901  WBC 7.9 6.7  < > 18.2* 11.4 11.4* 12.6 12.6*  NEUTROABS 5.7 5.2  --  15.6*  --   --   --   --   HGB 10.0* 11.6*  < > 11.5* 10.9* 10.9*  --  12.3*  HCT 30.9* 35.3*  < > 35.3* 34* 33.8*  --  38.4*  MCV 96.3 93.2  --  91.9  --  91.4  --  93.2  PLT 201 395.0  < > 228 207 207  --  247  < > = values in this interval not displayed.  Recent Labs  08/16/16 0448  CHOL 98  LDLCALC 55  TRIG 49   Lab  Results  Component Value Date   MICROALBUR 1.4 08/28/2010   Lab Results  Component Value Date   TSH 1.205 01/10/2017   Lab Results  Component Value Date   HGBA1C 5.6 01/11/2017   Lab Results  Component Value Date   CHOL 98 08/16/2016   HDL 33 (L) 08/16/2016   LDLCALC 55 08/16/2016   TRIG 49 08/16/2016   CHOLHDL 3.0 08/16/2016    Significant Diagnostic Results in last 30 days:  Dg Chest 2 View  Result Date: 01/10/2017 CLINICAL DATA:  Acute onset of worsening generalized weakness. Initial encounter. EXAM: CHEST  2 VIEW COMPARISON:  Chest radiograph performed 10/17/2016 FINDINGS: The lungs are well-aerated. Mild vascular congestion is suggested. Mild left basilar atelectasis is seen. There is no evidence of pleural effusion or pneumothorax. The heart is mildly enlarged. No acute osseous abnormalities are seen. IMPRESSION: Mild vascular congestion and mild cardiomegaly. Mild left basilar atelectasis noted. Lungs otherwise clear. Electronically Signed   By: Garald Balding M.D.   On:  01/10/2017 17:26    Assessment and Plan  CELLULITIS R HEEL/BLISTER R HEEL- pt is already on vancomycin for C Diff; therefore will ad Keflex to cover strep 500 mg TID for 7 days; will follow    Webb Silversmith D. Sheppard Coil, MD

## 2017-01-27 ENCOUNTER — Encounter: Payer: Self-pay | Admitting: Podiatry

## 2017-01-27 ENCOUNTER — Ambulatory Visit (INDEPENDENT_AMBULATORY_CARE_PROVIDER_SITE_OTHER): Payer: Medicare Other | Admitting: Podiatry

## 2017-01-27 DIAGNOSIS — L089 Local infection of the skin and subcutaneous tissue, unspecified: Secondary | ICD-10-CM | POA: Diagnosis not present

## 2017-01-27 DIAGNOSIS — T148XXA Other injury of unspecified body region, initial encounter: Secondary | ICD-10-CM

## 2017-01-27 DIAGNOSIS — I2510 Atherosclerotic heart disease of native coronary artery without angina pectoris: Secondary | ICD-10-CM

## 2017-01-27 DIAGNOSIS — T8130XA Disruption of wound, unspecified, initial encounter: Secondary | ICD-10-CM

## 2017-01-27 DIAGNOSIS — G629 Polyneuropathy, unspecified: Secondary | ICD-10-CM | POA: Diagnosis not present

## 2017-01-27 DIAGNOSIS — S90821A Blister (nonthermal), right foot, initial encounter: Secondary | ICD-10-CM

## 2017-01-30 ENCOUNTER — Encounter: Payer: Self-pay | Admitting: Internal Medicine

## 2017-01-31 ENCOUNTER — Encounter: Payer: Self-pay | Admitting: Internal Medicine

## 2017-01-31 ENCOUNTER — Non-Acute Institutional Stay (SKILLED_NURSING_FACILITY): Payer: Medicare Other | Admitting: Internal Medicine

## 2017-01-31 DIAGNOSIS — E785 Hyperlipidemia, unspecified: Secondary | ICD-10-CM | POA: Diagnosis not present

## 2017-01-31 DIAGNOSIS — J439 Emphysema, unspecified: Secondary | ICD-10-CM

## 2017-01-31 DIAGNOSIS — E1149 Type 2 diabetes mellitus with other diabetic neurological complication: Secondary | ICD-10-CM

## 2017-01-31 DIAGNOSIS — I4719 Other supraventricular tachycardia: Secondary | ICD-10-CM

## 2017-01-31 DIAGNOSIS — I959 Hypotension, unspecified: Secondary | ICD-10-CM | POA: Diagnosis not present

## 2017-01-31 DIAGNOSIS — A0472 Enterocolitis due to Clostridium difficile, not specified as recurrent: Secondary | ICD-10-CM

## 2017-01-31 DIAGNOSIS — E11649 Type 2 diabetes mellitus with hypoglycemia without coma: Secondary | ICD-10-CM | POA: Diagnosis not present

## 2017-01-31 DIAGNOSIS — I2510 Atherosclerotic heart disease of native coronary artery without angina pectoris: Secondary | ICD-10-CM | POA: Diagnosis not present

## 2017-01-31 DIAGNOSIS — I480 Paroxysmal atrial fibrillation: Secondary | ICD-10-CM

## 2017-01-31 DIAGNOSIS — I5033 Acute on chronic diastolic (congestive) heart failure: Secondary | ICD-10-CM | POA: Diagnosis not present

## 2017-01-31 DIAGNOSIS — I471 Supraventricular tachycardia: Secondary | ICD-10-CM

## 2017-01-31 NOTE — Progress Notes (Signed)
Location:  [place] Room Number: [id number] Place of Service:  SNF (31) Noah Delaine. Sheppard Coil, MD  PCP: [name], MD Patient Care Team: Cassandria Anger, MD as PCP - [place], DPM as Consulting Physician [facility])  Extended Emergency Contact Information Primary Emergency Contact: Modesto,Frances Address: [address] [place], [place] [address] [name] of [place] Phone: [phone] Relation: Spouse Secondary Emergency Contact: Smith,Charlotte Address: [address]          [place], [place] [address] [name] of [employer] Phone: [phone] Relation: Daughter  Allergies  Allergen Reactions  . Xarelto [Rivaroxaban] Rash    Chief Complaint  Patient presents with  . Discharge Note    Discharged from SNF    HPI:  76 y.o. male  with h/o CAD, CVA, chronic diastolic Hf, paroxysmal atrial fibrillation not on anticoagulation, and T2DM insulin dependent. He presented to the ED with several day hx of progressive weakness, lethargy, recurrent falls and loose stools. He was found to be hypotensive and hypoglycemic. He was admitted to [facility] from 1/29-2/1 for treatment with IVF and change in diabetic regimen. Hospital course was complicated by brief atrial tachycardia, tx with metoprolol and diarrhea which resolved during hospitalization. Pt was admitted to SNF with generalized weakness for OT/PT and is now ready to be d/c to home . Pt was treated for C diff diarrhea with vancomycin for 14 days while he was at [facility].    Past Medical History:  Diagnosis Date  . Arthritis   . B12 deficiency   . CAD (coronary artery disease)    a. Approx. 2000 - MI. Cath: single vsl dz, PTCA dLAD/medical rx. ;  b. NSTEMI 11/13 => IVUS attempted for RCA but not successful; anatomy felt stable from 2000 => med Rx. c. [place] 08/2016: severe stable diffuse dLAD, severe prox RCA -> PCI of RCA unsuccessful, consider CABG for  refractory angina.  . Cancer ([place])    skin cancer- head   . Cataracts   . Cholelithiasis   . Chronic diastolic CHF (congestive heart failure) ([place])   . Claustrophobia   . CVA (cerebral vascular accident) ([place]) ~ 2001   vision inparted from stroke.  Visual Memory loss  . Diabetic neuropathy ([place])   . Diverticulitis   . Dysrhythmia    if he does not take Metoprolol- Afib  . ED (erectile dysfunction)   . Essential hypertension   . History of MRSA infection    Right foot  . History of stomach ulcers   . HOH (hard of hearing)   . Hypercholesterolemia   . Legally blind   . Lower extremity edema   . Morbid obesity ([place])   . Neuropathy ([place])   . OSA on CPAP   . PAF (paroxysmal atrial fibrillation) ([name])    a. confirmed by event monitor. b. 02/2014 rash on Coumadin, patient decided to discontinue Xarelto due to possible rash, cost and lawyers ads on TV, agreed to take Plavix.  . Pneumonia 2016  . Tunnel vision    Since stroke  . Type II diabetes mellitus ([place])   . Venous stasis   . Weakness 01/11/2017    Past Surgical History:  Procedure Laterality Date  . CARDIAC CATHETERIZATION  1990's  . CARDIAC CATHETERIZATION N/A 08/17/2016   Procedure: Left Heart Cath and Coronary Angiography;  Surgeon: Peter M Martinique, MD;  Location: [facility] CV LAB;  Service:  Cardiovascular;  Laterality: N/A;  . CARDIAC CATHETERIZATION N/A 08/19/2016   Procedure: Coronary Balloon Angioplasty;  Surgeon: Peter M Martinique, MD;  Location: [facility] CV LAB;  Service: Cardiovascular;  Laterality: N/A;  . CATARACT EXTRACTION  10/2016  . CEREBRAL ANGIOGRAM  ~ 2000  . COLONOSCOPY    . LEFT HEART CATHETERIZATION WITH CORONARY ANGIOGRAM N/A 10/27/2012   Procedure: LEFT HEART CATHETERIZATION WITH CORONARY ANGIOGRAM;  Surgeon: Burnell Blanks, MD;  Location: [facility] CATH LAB;  Service: Cardiovascular;  Laterality: N/A;  . TOE AMPUTATION  2006; 2009   "Dr. Blenda Mounts; big toe left foot; little toe on right foot"  (10/26/2012)  . TOE AMPUTATION Right 12/24/2015   2nd & 3 toes/notes 1/13//2017  . TOE AMPUTATION Right    5TH TOE  . 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 His smoking use included Cigarettes and Cigars. He has a 30.00 pack-year smoking history. He quit smokeless tobacco use about 5 months ago. He reports that he drinks alcohol. He reports that he does not use drugs. Social History   Social History  . Marital status: Married    Spouse name: N/A  . Number of children: N/A  . Years of education: N/A   Occupational History  . Not on file.   Social History Main Topics  . Smoking status: Former Smoker    Packs/day: 1.00    Years: 30.00    Types: Cigarettes, Cigars  . Smokeless tobacco: Former Systems developer    Quit date: 08/15/2016     Comment: 12/2016 quit smoking in 1998  . Alcohol use Yes     Comment: 12/2016   rare   . Drug use: No  . Sexual activity: Yes   Other Topics Concern  . Not on file   Social History Narrative   Disabled s/p CVA    Pertinent  Health Maintenance Due  Topic Date Due  . FOOT EXAM  08/27/1951  . OPHTHALMOLOGY EXAM  08/27/1951  . HEMOGLOBIN A1C  07/11/2017  . COLONOSCOPY  04/06/2018  . INFLUENZA VACCINE  Completed  . PNA vac Low Risk Adult  Completed    Medications: Allergies as of 01/31/2017      Reactions   Xarelto [rivaroxaban] Rash      Medication List       Accurate as of 01/31/17  3:51 PM. Always use your most recent med list.          acetaminophen 500 MG tablet Commonly known as:  TYLENOL Take 1,000 mg by mouth at bedtime.   aspirin EC 81 MG tablet Take 81 mg by mouth daily.   cephALEXin 500 MG capsule Commonly known as:  KEFLEX Take 500 mg by mouth 3 (three) times daily.   cholecalciferol 1000 units tablet Commonly known as:  VITAMIN D Take 1,000 Units by  mouth daily.   clopidogrel 75 MG tablet Commonly known as:  PLAVIX Take 1 tablet (75 mg total) by mouth daily.   collagenase ointment Commonly known as:  SANTYL Apply 1 application topically daily.   feeding supplement (PRO-STAT SUGAR FREE 64) Liqd Take 30 mLs by mouth 2 (two) times daily.   Fluticasone-Salmeterol 100-50 MCG/DOSE Aepb Commonly known as:  ADVAIR DISKUS Inhale 1 puff into the lungs 2 (two) times daily.   furosemide 80 MG tablet Commonly known as:  LASIX Take 1/2  tablet daily alternating with whole tablet daily   ibuprofen 800 MG tablet Commonly known as:  ADVIL,MOTRIN Take 800 mg by mouth daily as needed for mild pain or moderate pain.   insulin aspart 100 UNIT/ML injection Commonly known as:  novoLOG Inject 30 Units into the skin 4 (four) times daily - after meals and at bedtime. First dose on Thu 01/13/17 at 0800 Correction coverage: Resistant (obese, steroids) CBG < 70: implement hypoglycemia protocol CBG 70 - 120: 0 units CBG 121 - 150: 2 units CBG 151 - 200: 4 units CBG 201 - 250: 7 units CBG 251 - 300: 9 units CBG 301 - 350: 12units CBG 351 - 400: 15 units CBG > 400: call MD   isosorbide mononitrate 30 MG 24 hr tablet Commonly known as:  IMDUR Take 1 tablet (30 mg total) by mouth daily.   losartan 100 MG tablet Commonly known as:  COZAAR Take 1 tablet (100 mg total) by mouth daily.   metFORMIN 500 MG tablet Commonly known as:  GLUCOPHAGE Take 500 mg by mouth 2 (two) times daily with a meal.   metoprolol tartrate 25 MG tablet Commonly known as:  LOPRESSOR Take 1 tablet (25 mg total) by mouth 2 (two) times daily.   NEURONTIN 300 MG capsule Generic drug:  gabapentin Take 300 mg by mouth 2 (two) times daily. scheduled   gabapentin 300 MG capsule Commonly known as:  NEURONTIN Take 300 mg by mouth 3 (three) times daily as needed.   nitroGLYCERIN 0.4 MG SL tablet Commonly known as:  NITROSTAT Place 1 tablet (0.4 mg total) under the tongue every 5  (five) minutes as needed (up to 3 doses). For chest pain   pioglitazone 45 MG tablet Commonly known as:  ACTOS Take 1 tablet (45 mg total) by mouth daily.   potassium chloride SA 20 MEQ tablet Commonly known as:  K-DUR,KLOR-CON Take 1 tablet (20 mEq total) by mouth daily.   pravastatin 40 MG tablet Commonly known as:  PRAVACHOL Take 1 tablet (40 mg total) by mouth daily.   triamcinolone cream 0.5 % Commonly known as:  KENALOG Apply 1 application topically 2 (two) times daily as needed (rash).   vitamin B-12 1000 MCG tablet Commonly known as:  CYANOCOBALAMIN Take 1,000 mcg by mouth daily.        Vitals:   01/31/17 1245  BP: 124/67  Pulse: 74  Resp: (!) 22  Temp: 97.5 F (36.4 C)  SpO2: 96%  Weight: (!) 348 lb 8 oz (158.1 kg)  Height: 6\' 4"  (1.93 m)   Body mass index is 42.42 kg/m.  Physical Exam  GENERAL APPEARANCE: Alert, conversant. No acute distress.  HEENT: Unremarkable. RESPIRATORY: Breathing is even, unlabored. Lung sounds are clear   CARDIOVASCULAR: Heart RRR no murmurs, rubs or gallops. No peripheral edema.  GASTROINTESTINAL: Abdomen is soft, non-tender, not distended w/ normal bowel sounds.  NEUROLOGIC: Cranial nerves 2-12 grossly intact. Moves all extremities   Labs reviewed: Basic Metabolic Panel:  Recent Labs  10/18/16 0246  01/10/17 1628 01/11/17 0358 01/12/17 01/12/17 0901  NA 134*  < > 133* 136 133* 133*  K 5.1  < > 4.5 4.4  --  4.6  CL 101  < > 100* 103  --  101  CO2 25  < > 24 26  --  23  GLUCOSE 190*  < > 41* 112*  --  194*  BUN 15  < > 29* 23* 26* 26*  CREATININE 1.01  < >  1.19 0.87 0.9 0.94  CALCIUM 8.3*  < > 8.7* 8.6*  --  9.0  MG 1.9  --   --   --   --   --   < > = values in this interval not displayed. Lab Results  Component Value Date   MICROALBUR 1.4 08/28/2010   Liver Function Tests:  Recent Labs  10/16/16 2327 01/10/17 01/10/17 1628 01/11/17 0358  AST 24 36 36 24  ALT 11* 13 13* 15*  ALKPHOS 43 50 50 48    BILITOT 0.5  --  1.3* 0.4  PROT 5.6*  --  5.6* 5.5*  ALBUMIN 2.7*  --  2.8* 2.5*   No results for input(s): LIPASE, AMYLASE in the last 8760 hours. No results for input(s): AMMONIA in the last 8760 hours. CBC:  Recent Labs  10/18/16 0246 10/26/16 1411  01/10/17 1628 01/11/17 01/11/17 0358 01/12/17 01/12/17 0901  WBC 7.9 6.7  < > 18.2* 11.4 11.4* 12.6 12.6*  NEUTROABS 5.7 5.2  --  15.6*  --   --   --   --   HGB 10.0* 11.6*  < > 11.5* 10.9* 10.9*  --  12.3*  HCT 30.9* 35.3*  < > 35.3* 34* 33.8*  --  38.4*  MCV 96.3 93.2  --  91.9  --  91.4  --  93.2  PLT 201 395.0  < > 228 207 207  --  247  < > = values in this interval not displayed. Lipid  Recent Labs  08/16/16 0448  CHOL 98  HDL 33*  LDLCALC 55  TRIG 49   Cardiac Enzymes:  Recent Labs  01/10/17 2230 01/11/17 0358 01/11/17 1033  TROPONINI 0.39* 0.24* 0.18*   BNP:  Recent Labs  08/15/16 1135 08/30/16 1014  BNP 336.5* 84.5   CBG:  Recent Labs  01/12/17 2054 01/13/17 0740 01/13/17 1113  GLUCAP 174* 134* 208*    Procedures and Imaging Studies During Stay: Dg Chest 2 View  Result Date: 01/10/2017 CLINICAL DATA:  Acute onset of worsening generalized weakness. Initial encounter. EXAM: CHEST  2 VIEW COMPARISON:  Chest radiograph performed 10/17/2016 FINDINGS: The lungs are well-aerated. Mild vascular congestion is suggested. Mild left basilar atelectasis is seen. There is no evidence of pleural effusion or pneumothorax. The heart is mildly enlarged. No acute osseous abnormalities are seen. IMPRESSION: Mild vascular congestion and mild cardiomegaly. Mild left basilar atelectasis noted. Lungs otherwise clear. Electronically Signed   By: Garald Balding M.D.   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 difficile colitis   Patient is being discharged with the following home health services:  OT/PT/Nursing  Patient is being discharged with the following durable medical equipment:  hwavy duty WC 22" W by 42" D  Patient has been advised to f/u with their PCP in 1-2 weeks to bring them up to date on their rehab stay.  Social services at facility was responsible for arranging this appointment.  Pt was provided with a 30 day supply of prescriptions for medications and refills must be obtained from their PCP.  For controlled substances,  a more limited supply may be provided adequate until PCP appointment only.   Time spent > 30 min;> 50% of time with patient was spent reviewing records, labs, tests and studies, counseling and developing plan of care  Noah Delaine. Sheppard Coil, MD

## 2017-01-31 NOTE — Progress Notes (Signed)
Subjective: Charles [name] is a 76 y.o. 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 Since last appointment he was placed into a rehabilitation facility. He is complaining of pain to his heel and his wife noticed a large blister to the side of the heel. He does full history and physical getting better. He still has sharp pains to his feet his asthma increase his dosing gabapentin. He is on Keflex. He discontinued the other hand arise to C. difficile. Denies any systemic complaints such as fevers, chills, nausea, vomiting. No Pain, chest pain, short stress.  Objective: General: No acute distress, AAOx3  DP/PT pulses palpable 2/4, CRT < 3 sec to all digits.  Right foot: Incision with sutures intact. Hyperkeratotic tissue around the incision.  Fibro-granular base along the incision site on the right transmetatarsal amputation Along the lateral incision. Does appear to be more granular today and with hyper granulation tissue present. There is no swelling erythema, ascending cellulitis. There is mild edema but there is no fluctuance, crepitus, malodor, drainage/pus. To the posterior lateral aspect of the heels a large serous filled blister with erythema around the area. There is no ascending cellulitis. Upon puncturing this blister there was clear drainage present but there is no pus. There are no other open lesions or pre-ulcer lesions identified. No pain with calf compression, swelling, warmth, erythema.   Assessment and Plan:  Status post Right TMA withyper granulation tissue, right heel blister   -Treatment options discussed including all alternatives, risks, and complications -I applied silver nitrate to the incision along the lateral aspect with hyper [place] tissue. Recommended continue daily dressing changes with Santyl.  -I drained the blister to the lateral right heel. Continue in about appointment on this area daily. -Continue Keflex for 2 more weeks. -Offloading boots at  all times. -He can be weightbearing to the right foot and continue physical therapy help increase strength. -Will increase gabapentin to 600 mg 3 times a day. -Follow-up in 2 weeks or sooner if needed. Call any questions or concerns meantime.  Celesta Gentile, DPM

## 2017-02-03 DIAGNOSIS — I5032 Chronic diastolic (congestive) heart failure: Secondary | ICD-10-CM | POA: Diagnosis not present

## 2017-02-03 DIAGNOSIS — I110 Hypertensive heart disease with heart failure: Secondary | ICD-10-CM | POA: Diagnosis not present

## 2017-02-03 DIAGNOSIS — E11319 Type 2 diabetes mellitus with unspecified diabetic retinopathy without macular edema: Secondary | ICD-10-CM | POA: Diagnosis not present

## 2017-02-03 DIAGNOSIS — J449 Chronic obstructive pulmonary disease, unspecified: Secondary | ICD-10-CM | POA: Diagnosis not present

## 2017-02-03 DIAGNOSIS — I248 Other forms of acute ischemic heart disease: Secondary | ICD-10-CM | POA: Diagnosis not present

## 2017-02-03 DIAGNOSIS — E1140 Type 2 diabetes mellitus with diabetic neuropathy, unspecified: Secondary | ICD-10-CM | POA: Diagnosis not present

## 2017-02-04 ENCOUNTER — Telehealth: Payer: Self-pay | Admitting: Internal Medicine

## 2017-02-04 NOTE — Telephone Encounter (Signed)
Charles [name] at home [phone]  Need Verbal for  Skilled nursing  2 week 2 1 week 1  2 PRN med side effects or to change med

## 2017-02-06 ENCOUNTER — Encounter: Payer: Self-pay | Admitting: Internal Medicine

## 2017-02-07 ENCOUNTER — Telehealth: Payer: Self-pay | Admitting: Internal Medicine

## 2017-02-07 DIAGNOSIS — I110 Hypertensive heart disease with heart failure: Secondary | ICD-10-CM | POA: Diagnosis not present

## 2017-02-07 DIAGNOSIS — J449 Chronic obstructive pulmonary disease, unspecified: Secondary | ICD-10-CM | POA: Diagnosis not present

## 2017-02-07 DIAGNOSIS — E1140 Type 2 diabetes mellitus with diabetic neuropathy, unspecified: Secondary | ICD-10-CM | POA: Diagnosis not present

## 2017-02-07 DIAGNOSIS — I5032 Chronic diastolic (congestive) heart failure: Secondary | ICD-10-CM | POA: Diagnosis not present

## 2017-02-07 DIAGNOSIS — I248 Other forms of acute ischemic heart disease: Secondary | ICD-10-CM | POA: Diagnosis not present

## 2017-02-07 DIAGNOSIS — E11319 Type 2 diabetes mellitus with unspecified diabetic retinopathy without macular edema: Secondary | ICD-10-CM | POA: Diagnosis not present

## 2017-02-07 NOTE — Telephone Encounter (Signed)
Verbal Orders: For PT and OT 2x a week for 8 weeks For day training, stretching, endurance  [phone] - Toney Rakes

## 2017-02-07 NOTE — Telephone Encounter (Signed)
OK. Thx

## 2017-02-07 NOTE — Telephone Encounter (Signed)
Advised Charles [name]

## 2017-02-07 NOTE — Telephone Encounter (Signed)
Routing to dr plotnikov, please advise, I will call back

## 2017-02-07 NOTE — Telephone Encounter (Signed)
Routing to dr plotnikov, please advise, I will call [facility] back, thanks

## 2017-02-08 DIAGNOSIS — I110 Hypertensive heart disease with heart failure: Secondary | ICD-10-CM | POA: Diagnosis not present

## 2017-02-08 DIAGNOSIS — I5032 Chronic diastolic (congestive) heart failure: Secondary | ICD-10-CM | POA: Diagnosis not present

## 2017-02-08 DIAGNOSIS — E1140 Type 2 diabetes mellitus with diabetic neuropathy, unspecified: Secondary | ICD-10-CM | POA: Diagnosis not present

## 2017-02-08 DIAGNOSIS — E11319 Type 2 diabetes mellitus with unspecified diabetic retinopathy without macular edema: Secondary | ICD-10-CM | POA: Diagnosis not present

## 2017-02-08 DIAGNOSIS — J449 Chronic obstructive pulmonary disease, unspecified: Secondary | ICD-10-CM | POA: Diagnosis not present

## 2017-02-08 DIAGNOSIS — I248 Other forms of acute ischemic heart disease: Secondary | ICD-10-CM | POA: Diagnosis not present

## 2017-02-09 DIAGNOSIS — I248 Other forms of acute ischemic heart disease: Secondary | ICD-10-CM | POA: Diagnosis not present

## 2017-02-09 DIAGNOSIS — I5032 Chronic diastolic (congestive) heart failure: Secondary | ICD-10-CM | POA: Diagnosis not present

## 2017-02-09 DIAGNOSIS — I110 Hypertensive heart disease with heart failure: Secondary | ICD-10-CM | POA: Diagnosis not present

## 2017-02-09 DIAGNOSIS — E1140 Type 2 diabetes mellitus with diabetic neuropathy, unspecified: Secondary | ICD-10-CM | POA: Diagnosis not present

## 2017-02-09 DIAGNOSIS — J449 Chronic obstructive pulmonary disease, unspecified: Secondary | ICD-10-CM | POA: Diagnosis not present

## 2017-02-09 DIAGNOSIS — E11319 Type 2 diabetes mellitus with unspecified diabetic retinopathy without macular edema: Secondary | ICD-10-CM | POA: Diagnosis not present

## 2017-02-09 MED ORDER — CEPHALEXIN 500 MG PO CAPS: 500.0000 mg | ORAL_CAPSULE | Freq: Two times a day (BID) | ORAL | 0 refills | Status: DC

## 2017-02-10 ENCOUNTER — Telehealth: Payer: Self-pay | Admitting: Internal Medicine

## 2017-02-10 DIAGNOSIS — E11319 Type 2 diabetes mellitus with unspecified diabetic retinopathy without macular edema: Secondary | ICD-10-CM | POA: Diagnosis not present

## 2017-02-10 DIAGNOSIS — J449 Chronic obstructive pulmonary disease, unspecified: Secondary | ICD-10-CM | POA: Diagnosis not present

## 2017-02-10 DIAGNOSIS — E1140 Type 2 diabetes mellitus with diabetic neuropathy, unspecified: Secondary | ICD-10-CM | POA: Diagnosis not present

## 2017-02-10 DIAGNOSIS — I248 Other forms of acute ischemic heart disease: Secondary | ICD-10-CM | POA: Diagnosis not present

## 2017-02-10 DIAGNOSIS — I5032 Chronic diastolic (congestive) heart failure: Secondary | ICD-10-CM | POA: Diagnosis not present

## 2017-02-10 DIAGNOSIS — I110 Hypertensive heart disease with heart failure: Secondary | ICD-10-CM | POA: Diagnosis not present

## 2017-02-10 NOTE — Telephone Encounter (Signed)
Jennie called from [place] at [facility] request verbal order for OT: 2 time a week for 2 wks, 1 time a week for 1 wk and additional 2 times a week for 2 wks. Please give her a call back.

## 2017-02-10 NOTE — Telephone Encounter (Signed)
OK. Thx

## 2017-02-11 ENCOUNTER — Ambulatory Visit (INDEPENDENT_AMBULATORY_CARE_PROVIDER_SITE_OTHER): Payer: Medicare Other

## 2017-02-11 ENCOUNTER — Encounter: Payer: Self-pay | Admitting: Podiatry

## 2017-02-11 ENCOUNTER — Ambulatory Visit (INDEPENDENT_AMBULATORY_CARE_PROVIDER_SITE_OTHER): Payer: Medicare Other | Admitting: Podiatry

## 2017-02-11 VITALS — BP 132/47 | HR 72

## 2017-02-11 DIAGNOSIS — Z9889 Other specified postprocedural states: Secondary | ICD-10-CM

## 2017-02-11 DIAGNOSIS — E11319 Type 2 diabetes mellitus with unspecified diabetic retinopathy without macular edema: Secondary | ICD-10-CM | POA: Diagnosis not present

## 2017-02-11 DIAGNOSIS — T8130XD Disruption of wound, unspecified, subsequent encounter: Secondary | ICD-10-CM

## 2017-02-11 DIAGNOSIS — J449 Chronic obstructive pulmonary disease, unspecified: Secondary | ICD-10-CM | POA: Diagnosis not present

## 2017-02-11 DIAGNOSIS — L97511 Non-pressure chronic ulcer of other part of right foot limited to breakdown of skin: Secondary | ICD-10-CM | POA: Diagnosis not present

## 2017-02-11 DIAGNOSIS — I248 Other forms of acute ischemic heart disease: Secondary | ICD-10-CM | POA: Diagnosis not present

## 2017-02-11 DIAGNOSIS — I5032 Chronic diastolic (congestive) heart failure: Secondary | ICD-10-CM | POA: Diagnosis not present

## 2017-02-11 DIAGNOSIS — I110 Hypertensive heart disease with heart failure: Secondary | ICD-10-CM | POA: Diagnosis not present

## 2017-02-11 DIAGNOSIS — E1140 Type 2 diabetes mellitus with diabetic neuropathy, unspecified: Secondary | ICD-10-CM | POA: Diagnosis not present

## 2017-02-11 MED ORDER — CEPHALEXIN 500 MG PO CAPS: 500.0000 mg | ORAL_CAPSULE | Freq: Three times a day (TID) | ORAL | 0 refills | Status: DC

## 2017-02-11 NOTE — Telephone Encounter (Signed)
Left detail massage inform Jennie of Dr. Camila Li response.

## 2017-02-14 NOTE — Progress Notes (Signed)
Subjective: Charles [name] is a 76 y.o. 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 At the last appointment he also had an infected blister on the lateral right heel. He has continue with Keflex. His wife's continue with Santyl dressing changes to be TMA incision site daily. The wound is doing much better. She also states that the redness of the heels also resolved. He states that since being back on his blood sugar has been better controlled and he has not been having the pain to his foot that he was having. He is also taking gabapentin 600 mg 3 times a day he believes. He denies any significant Murray Hodgkins his symptoms are much improved. Denies any systemic complaints such as fevers, chills, nausea, vomiting. No Pain, chest pain, short stress.  Objective: General: No acute distress, AAOx3  DP/PT pulses palpable 2/4, CRT < 3 sec to all digits.  Right foot: Incision with sutures intact. Hyperkeratotic tissue around the incision.  Minimal and much improved fibro-granular base along the incision site on the right transmetatarsal amputation mostly along the lateral incision. Does appear to be more granular today and with hyper granulation tissue present. There is no surrounding erythema, ascending cellulitis. There is mild edema but there is no fluctuance, crepitus, malodor, drainage/pus. To the posterior lateral heel is what appears to be the old blister. This is currently having no fluid there is no swelling erythema there is no clinical signs of infection. No other open lesions or pre-ulcerative lesions identified at this time. No pain with calf compression, swelling, warmth, erythema.   Assessment and Plan:  Status post Right TMA withyper granulation tissue, right heel blister both of which are much improved today.   -Treatment options discussed including all alternatives, risks, and complications -Wiley debrided the wound to the TMA site under granular healthy tissue. Overall the wound is  much improved. Continue with Santyl dressing changes daily to the TMA incision site. Continue dry dressing on the previous blister site as this is doing well. We will and this area callus over and finish drying. -Continue Keflex for one more week which is sent to his pharmacy. -Continue gabapentin. His pain to his foot is significantly improved as well as his blood sugars been better controlled. -Monitor for any clinical signs or symptoms of infection and directed to call the office immediately should any occur or go to the ER. -Follow-up in 3 weeks or sooner if needed. Call any questions or concerns meantime.  Celesta Gentile, DPM

## 2017-02-15 DIAGNOSIS — I5032 Chronic diastolic (congestive) heart failure: Secondary | ICD-10-CM | POA: Diagnosis not present

## 2017-02-15 DIAGNOSIS — I248 Other forms of acute ischemic heart disease: Secondary | ICD-10-CM | POA: Diagnosis not present

## 2017-02-15 DIAGNOSIS — I110 Hypertensive heart disease with heart failure: Secondary | ICD-10-CM | POA: Diagnosis not present

## 2017-02-15 DIAGNOSIS — J449 Chronic obstructive pulmonary disease, unspecified: Secondary | ICD-10-CM | POA: Diagnosis not present

## 2017-02-15 DIAGNOSIS — E1140 Type 2 diabetes mellitus with diabetic neuropathy, unspecified: Secondary | ICD-10-CM | POA: Diagnosis not present

## 2017-02-15 DIAGNOSIS — E11319 Type 2 diabetes mellitus with unspecified diabetic retinopathy without macular edema: Secondary | ICD-10-CM | POA: Diagnosis not present

## 2017-02-17 DIAGNOSIS — I5032 Chronic diastolic (congestive) heart failure: Secondary | ICD-10-CM | POA: Diagnosis not present

## 2017-02-17 DIAGNOSIS — I110 Hypertensive heart disease with heart failure: Secondary | ICD-10-CM | POA: Diagnosis not present

## 2017-02-17 DIAGNOSIS — I248 Other forms of acute ischemic heart disease: Secondary | ICD-10-CM | POA: Diagnosis not present

## 2017-02-17 DIAGNOSIS — E11319 Type 2 diabetes mellitus with unspecified diabetic retinopathy without macular edema: Secondary | ICD-10-CM | POA: Diagnosis not present

## 2017-02-17 DIAGNOSIS — E1140 Type 2 diabetes mellitus with diabetic neuropathy, unspecified: Secondary | ICD-10-CM | POA: Diagnosis not present

## 2017-02-17 DIAGNOSIS — J449 Chronic obstructive pulmonary disease, unspecified: Secondary | ICD-10-CM | POA: Diagnosis not present

## 2017-02-18 DIAGNOSIS — E1140 Type 2 diabetes mellitus with diabetic neuropathy, unspecified: Secondary | ICD-10-CM | POA: Diagnosis not present

## 2017-02-18 DIAGNOSIS — E11319 Type 2 diabetes mellitus with unspecified diabetic retinopathy without macular edema: Secondary | ICD-10-CM | POA: Diagnosis not present

## 2017-02-18 DIAGNOSIS — I248 Other forms of acute ischemic heart disease: Secondary | ICD-10-CM | POA: Diagnosis not present

## 2017-02-18 DIAGNOSIS — I5032 Chronic diastolic (congestive) heart failure: Secondary | ICD-10-CM | POA: Diagnosis not present

## 2017-02-18 DIAGNOSIS — J449 Chronic obstructive pulmonary disease, unspecified: Secondary | ICD-10-CM | POA: Diagnosis not present

## 2017-02-18 DIAGNOSIS — I110 Hypertensive heart disease with heart failure: Secondary | ICD-10-CM | POA: Diagnosis not present

## 2017-02-22 ENCOUNTER — Ambulatory Visit: Payer: Medicare Other | Admitting: Internal Medicine

## 2017-02-22 DIAGNOSIS — I110 Hypertensive heart disease with heart failure: Secondary | ICD-10-CM | POA: Diagnosis not present

## 2017-02-22 DIAGNOSIS — J449 Chronic obstructive pulmonary disease, unspecified: Secondary | ICD-10-CM | POA: Diagnosis not present

## 2017-02-22 DIAGNOSIS — E11319 Type 2 diabetes mellitus with unspecified diabetic retinopathy without macular edema: Secondary | ICD-10-CM | POA: Diagnosis not present

## 2017-02-22 DIAGNOSIS — E1140 Type 2 diabetes mellitus with diabetic neuropathy, unspecified: Secondary | ICD-10-CM | POA: Diagnosis not present

## 2017-02-22 DIAGNOSIS — I5032 Chronic diastolic (congestive) heart failure: Secondary | ICD-10-CM | POA: Diagnosis not present

## 2017-02-22 DIAGNOSIS — I248 Other forms of acute ischemic heart disease: Secondary | ICD-10-CM | POA: Diagnosis not present

## 2017-02-24 DIAGNOSIS — J449 Chronic obstructive pulmonary disease, unspecified: Secondary | ICD-10-CM | POA: Diagnosis not present

## 2017-02-24 DIAGNOSIS — I248 Other forms of acute ischemic heart disease: Secondary | ICD-10-CM | POA: Diagnosis not present

## 2017-02-24 DIAGNOSIS — E11319 Type 2 diabetes mellitus with unspecified diabetic retinopathy without macular edema: Secondary | ICD-10-CM | POA: Diagnosis not present

## 2017-02-24 DIAGNOSIS — I110 Hypertensive heart disease with heart failure: Secondary | ICD-10-CM | POA: Diagnosis not present

## 2017-02-24 DIAGNOSIS — I5032 Chronic diastolic (congestive) heart failure: Secondary | ICD-10-CM | POA: Diagnosis not present

## 2017-02-24 DIAGNOSIS — E1140 Type 2 diabetes mellitus with diabetic neuropathy, unspecified: Secondary | ICD-10-CM | POA: Diagnosis not present

## 2017-02-28 DIAGNOSIS — I248 Other forms of acute ischemic heart disease: Secondary | ICD-10-CM | POA: Diagnosis not present

## 2017-02-28 DIAGNOSIS — H548 Legal blindness, as defined in USA: Secondary | ICD-10-CM | POA: Diagnosis not present

## 2017-02-28 DIAGNOSIS — Z7902 Long term (current) use of antithrombotics/antiplatelets: Secondary | ICD-10-CM

## 2017-02-28 DIAGNOSIS — I480 Paroxysmal atrial fibrillation: Secondary | ICD-10-CM | POA: Diagnosis not present

## 2017-02-28 DIAGNOSIS — Z89412 Acquired absence of left great toe: Secondary | ICD-10-CM

## 2017-02-28 DIAGNOSIS — I110 Hypertensive heart disease with heart failure: Secondary | ICD-10-CM | POA: Diagnosis not present

## 2017-02-28 DIAGNOSIS — E11319 Type 2 diabetes mellitus with unspecified diabetic retinopathy without macular edema: Secondary | ICD-10-CM | POA: Diagnosis not present

## 2017-02-28 DIAGNOSIS — Z89421 Acquired absence of other right toe(s): Secondary | ICD-10-CM

## 2017-02-28 DIAGNOSIS — Z794 Long term (current) use of insulin: Secondary | ICD-10-CM

## 2017-02-28 DIAGNOSIS — I2510 Atherosclerotic heart disease of native coronary artery without angina pectoris: Secondary | ICD-10-CM | POA: Diagnosis not present

## 2017-02-28 DIAGNOSIS — Z87891 Personal history of nicotine dependence: Secondary | ICD-10-CM

## 2017-02-28 DIAGNOSIS — E1140 Type 2 diabetes mellitus with diabetic neuropathy, unspecified: Secondary | ICD-10-CM | POA: Diagnosis not present

## 2017-02-28 DIAGNOSIS — J449 Chronic obstructive pulmonary disease, unspecified: Secondary | ICD-10-CM | POA: Diagnosis not present

## 2017-02-28 DIAGNOSIS — I69398 Other sequelae of cerebral infarction: Secondary | ICD-10-CM

## 2017-02-28 DIAGNOSIS — Z9181 History of falling: Secondary | ICD-10-CM

## 2017-02-28 DIAGNOSIS — I872 Venous insufficiency (chronic) (peripheral): Secondary | ICD-10-CM | POA: Diagnosis not present

## 2017-02-28 DIAGNOSIS — I5032 Chronic diastolic (congestive) heart failure: Secondary | ICD-10-CM | POA: Diagnosis not present

## 2017-02-28 DIAGNOSIS — M1990 Unspecified osteoarthritis, unspecified site: Secondary | ICD-10-CM | POA: Diagnosis not present

## 2017-03-01 DIAGNOSIS — J449 Chronic obstructive pulmonary disease, unspecified: Secondary | ICD-10-CM | POA: Diagnosis not present

## 2017-03-01 DIAGNOSIS — I110 Hypertensive heart disease with heart failure: Secondary | ICD-10-CM | POA: Diagnosis not present

## 2017-03-01 DIAGNOSIS — I248 Other forms of acute ischemic heart disease: Secondary | ICD-10-CM | POA: Diagnosis not present

## 2017-03-01 DIAGNOSIS — I5032 Chronic diastolic (congestive) heart failure: Secondary | ICD-10-CM | POA: Diagnosis not present

## 2017-03-01 DIAGNOSIS — E1140 Type 2 diabetes mellitus with diabetic neuropathy, unspecified: Secondary | ICD-10-CM | POA: Diagnosis not present

## 2017-03-01 DIAGNOSIS — E11319 Type 2 diabetes mellitus with unspecified diabetic retinopathy without macular edema: Secondary | ICD-10-CM | POA: Diagnosis not present

## 2017-03-02 ENCOUNTER — Ambulatory Visit (INDEPENDENT_AMBULATORY_CARE_PROVIDER_SITE_OTHER): Payer: Medicare Other | Admitting: Podiatry

## 2017-03-02 ENCOUNTER — Encounter: Payer: Self-pay | Admitting: Podiatry

## 2017-03-02 VITALS — BP 122/61 | HR 71 | Resp 18

## 2017-03-02 DIAGNOSIS — I2510 Atherosclerotic heart disease of native coronary artery without angina pectoris: Secondary | ICD-10-CM | POA: Diagnosis not present

## 2017-03-02 DIAGNOSIS — T8130XA Disruption of wound, unspecified, initial encounter: Secondary | ICD-10-CM | POA: Diagnosis not present

## 2017-03-02 DIAGNOSIS — G629 Polyneuropathy, unspecified: Secondary | ICD-10-CM | POA: Diagnosis not present

## 2017-03-03 DIAGNOSIS — J449 Chronic obstructive pulmonary disease, unspecified: Secondary | ICD-10-CM | POA: Diagnosis not present

## 2017-03-03 DIAGNOSIS — I5032 Chronic diastolic (congestive) heart failure: Secondary | ICD-10-CM | POA: Diagnosis not present

## 2017-03-03 DIAGNOSIS — I110 Hypertensive heart disease with heart failure: Secondary | ICD-10-CM | POA: Diagnosis not present

## 2017-03-03 DIAGNOSIS — E1140 Type 2 diabetes mellitus with diabetic neuropathy, unspecified: Secondary | ICD-10-CM | POA: Diagnosis not present

## 2017-03-03 DIAGNOSIS — I248 Other forms of acute ischemic heart disease: Secondary | ICD-10-CM | POA: Diagnosis not present

## 2017-03-03 DIAGNOSIS — E11319 Type 2 diabetes mellitus with unspecified diabetic retinopathy without macular edema: Secondary | ICD-10-CM | POA: Diagnosis not present

## 2017-03-04 DIAGNOSIS — I5032 Chronic diastolic (congestive) heart failure: Secondary | ICD-10-CM | POA: Diagnosis not present

## 2017-03-04 DIAGNOSIS — J449 Chronic obstructive pulmonary disease, unspecified: Secondary | ICD-10-CM | POA: Diagnosis not present

## 2017-03-04 DIAGNOSIS — I110 Hypertensive heart disease with heart failure: Secondary | ICD-10-CM | POA: Diagnosis not present

## 2017-03-04 DIAGNOSIS — E1140 Type 2 diabetes mellitus with diabetic neuropathy, unspecified: Secondary | ICD-10-CM | POA: Diagnosis not present

## 2017-03-04 DIAGNOSIS — E11319 Type 2 diabetes mellitus with unspecified diabetic retinopathy without macular edema: Secondary | ICD-10-CM | POA: Diagnosis not present

## 2017-03-04 DIAGNOSIS — I248 Other forms of acute ischemic heart disease: Secondary | ICD-10-CM | POA: Diagnosis not present

## 2017-03-05 NOTE — Progress Notes (Signed)
Subjective: Charles [name] is a 76 y.o. is seen today in office s/p right TMA preformed on 10/13/16.his wife states that he continue with Santyl dressing changes daily. Overall she feels that the wound is doing much better and he has no complaints today. He is not on antibiotics at this time. He states that since his blood pressure is been better controlled out of being the hospital he states he has not had any sharp pain to his foot he said no pain. He is also starting to become more mobile and active. Overall he states he feels much better. Denies any systemic complaints such as fevers, chills, nausea, vomiting. No Pain, chest pain, short stress.  Objective: General: No acute distress, AAOx3  DP/PT pulses palpable 2/4, CRT < 3 sec to all digits.  Right foot: incision appears to be doing well. The incision appears to be healed at this time. There is no overlying scab how there is mild hyperkeratotic tissue. Upon debridement underlying wound is healed and there is no drainage or pus. There is no swelling erythema, ascending saline disc, fluctuance, crepitus, malodor. There is no other open lesions identified on the patient's right foot. On the left foot pre-ulcerative lesion left foot metatarsal 1 from the previous ulceration however there is no skin breakdown identified at this time to the left foot. Overall he is doing significantly better. The area of the blister on the lateral right heel is also healed at this time. There are no other open lesions or pre-ulcer lesions identified. No pain with calf compression, swelling, warmth, erythema.   Assessment and Plan:  Healed ulceration right heel, healed wound dehiscence from the transmetatarsal amputation site.   -Treatment options discussed including all alternatives, risks, and complications -at this time is doing well. Debrided the hyperkeratotic tissue overlying the incision on the TMA. Wound is healed this point. He has no skin breakdown identified.  I recommended toe filler for his inserts on the right foot however he declined this today as what did not want to proceed with this. However we will continue to monitor. He is wearing a regular shoe without any problems. At this point I'll see him back in 3 months for routine care or sooner if any issues are to arise. I will discharge her from the postoperative course this time.  Celesta Gentile, DPM

## 2017-03-08 DIAGNOSIS — E1140 Type 2 diabetes mellitus with diabetic neuropathy, unspecified: Secondary | ICD-10-CM | POA: Diagnosis not present

## 2017-03-08 DIAGNOSIS — I5032 Chronic diastolic (congestive) heart failure: Secondary | ICD-10-CM | POA: Diagnosis not present

## 2017-03-08 DIAGNOSIS — I248 Other forms of acute ischemic heart disease: Secondary | ICD-10-CM | POA: Diagnosis not present

## 2017-03-08 DIAGNOSIS — J449 Chronic obstructive pulmonary disease, unspecified: Secondary | ICD-10-CM | POA: Diagnosis not present

## 2017-03-08 DIAGNOSIS — E11319 Type 2 diabetes mellitus with unspecified diabetic retinopathy without macular edema: Secondary | ICD-10-CM | POA: Diagnosis not present

## 2017-03-08 DIAGNOSIS — I110 Hypertensive heart disease with heart failure: Secondary | ICD-10-CM | POA: Diagnosis not present

## 2017-03-10 ENCOUNTER — Other Ambulatory Visit (INDEPENDENT_AMBULATORY_CARE_PROVIDER_SITE_OTHER): Payer: Medicare Other

## 2017-03-10 ENCOUNTER — Ambulatory Visit (INDEPENDENT_AMBULATORY_CARE_PROVIDER_SITE_OTHER): Payer: Medicare Other | Admitting: Internal Medicine

## 2017-03-10 ENCOUNTER — Encounter: Payer: Self-pay | Admitting: Internal Medicine

## 2017-03-10 DIAGNOSIS — E11649 Type 2 diabetes mellitus with hypoglycemia without coma: Secondary | ICD-10-CM | POA: Diagnosis not present

## 2017-03-10 DIAGNOSIS — R600 Localized edema: Secondary | ICD-10-CM | POA: Diagnosis not present

## 2017-03-10 DIAGNOSIS — I2510 Atherosclerotic heart disease of native coronary artery without angina pectoris: Secondary | ICD-10-CM

## 2017-03-10 DIAGNOSIS — E1149 Type 2 diabetes mellitus with other diabetic neurological complication: Secondary | ICD-10-CM | POA: Diagnosis not present

## 2017-03-10 DIAGNOSIS — I96 Gangrene, not elsewhere classified: Secondary | ICD-10-CM

## 2017-03-10 DIAGNOSIS — I110 Hypertensive heart disease with heart failure: Secondary | ICD-10-CM | POA: Diagnosis not present

## 2017-03-10 DIAGNOSIS — E11319 Type 2 diabetes mellitus with unspecified diabetic retinopathy without macular edema: Secondary | ICD-10-CM | POA: Diagnosis not present

## 2017-03-10 DIAGNOSIS — I5033 Acute on chronic diastolic (congestive) heart failure: Secondary | ICD-10-CM | POA: Diagnosis not present

## 2017-03-10 DIAGNOSIS — I5032 Chronic diastolic (congestive) heart failure: Secondary | ICD-10-CM | POA: Diagnosis not present

## 2017-03-10 DIAGNOSIS — J449 Chronic obstructive pulmonary disease, unspecified: Secondary | ICD-10-CM | POA: Diagnosis not present

## 2017-03-10 DIAGNOSIS — E538 Deficiency of other specified B group vitamins: Secondary | ICD-10-CM | POA: Diagnosis not present

## 2017-03-10 DIAGNOSIS — A0472 Enterocolitis due to Clostridium difficile, not specified as recurrent: Secondary | ICD-10-CM | POA: Diagnosis not present

## 2017-03-10 DIAGNOSIS — E1140 Type 2 diabetes mellitus with diabetic neuropathy, unspecified: Secondary | ICD-10-CM | POA: Diagnosis not present

## 2017-03-10 DIAGNOSIS — I248 Other forms of acute ischemic heart disease: Secondary | ICD-10-CM | POA: Diagnosis not present

## 2017-03-10 DIAGNOSIS — I480 Paroxysmal atrial fibrillation: Secondary | ICD-10-CM | POA: Diagnosis not present

## 2017-03-10 LAB — BASIC METABOLIC PANEL
BUN: 13 mg/dL (ref 6–23)
CALCIUM: 9.2 mg/dL (ref 8.4–10.5)
CO2: 33 mEq/L — ABNORMAL HIGH (ref 19–32)
Chloride: 99 mEq/L (ref 96–112)
Creatinine, Ser: 0.85 mg/dL (ref 0.40–1.50)
GFR: 93.26 mL/min (ref >60.00)
GLUCOSE: 199 mg/dL — AB (ref 70–99)
Potassium: 3.8 mEq/L (ref 3.5–5.1)
Sodium: 139 mEq/L (ref 135–145)

## 2017-03-10 LAB — HEPATIC FUNCTION PANEL
ALBUMIN: 3.5 g/dL (ref 3.5–5.2)
ALT: 7 U/L (ref 0–53)
AST: 15 U/L (ref 0–37)
Alkaline Phosphatase: 50 U/L (ref 39–117)
Bilirubin, Direct: 0.1 mg/dL (ref 0.0–0.3)
TOTAL PROTEIN: 6.2 g/dL (ref 6.0–8.3)
Total Bilirubin: 0.6 mg/dL (ref 0.2–1.2)

## 2017-03-10 LAB — HEMOGLOBIN A1C: Hgb A1c MFr Bld: 7.3 % — ABNORMAL HIGH (ref 4.6–6.5)

## 2017-03-10 MED ORDER — ISOSORBIDE MONONITRATE ER 60 MG PO TB24: 60.0000 mg | ORAL_TABLET | Freq: Every day | ORAL | 3 refills | Status: DC

## 2017-03-10 MED ORDER — CLOTRIMAZOLE-BETAMETHASONE 1-0.05 % EX CREA: 1.0000 "application " | TOPICAL_CREAM | Freq: Two times a day (BID) | CUTANEOUS | 1 refills | Status: AC

## 2017-03-10 MED ORDER — METFORMIN HCL 500 MG PO TABS: 500.0000 mg | ORAL_TABLET | Freq: Two times a day (BID) | ORAL | 2 refills | Status: DC

## 2017-03-10 NOTE — Progress Notes (Signed)
Pre-visit discussion using our clinic review tool. No additional management support is needed unless otherwise documented below in the visit note.  

## 2017-03-10 NOTE — Assessment & Plan Note (Signed)
On B12 

## 2017-03-10 NOTE — Progress Notes (Signed)
Subjective:  Patient ID: Charles [name], male    DOB: [birth year]  Age: 76 y.o. MRN: [id number]  CC: Follow-up (rehab- right foot amputation ); Diabetes; Atrial Fibrillation; Coronary Artery Disease; and Extremity Weakness (bilateral legs )   HPI Charles [name] presents for DM, PAF, CAD, C diff f/u S/p R foot partial amputation The pt went off Glimepiride  CBG 170-210 after meals, no "lows"   Outpatient Medications Prior to Visit  Medication Sig Dispense Refill  . acetaminophen (TYLENOL) 500 MG tablet Take 1,000 mg by mouth at bedtime.     Marland Kitchen aspirin EC 81 MG tablet Take 81 mg by mouth daily.     . cephALEXin (KEFLEX) 500 MG capsule Take 1 capsule (500 mg total) by mouth 3 (three) times daily. 21 capsule 0  . cholecalciferol (VITAMIN D) 1000 units tablet Take 1,000 Units by mouth daily.     . clopidogrel (PLAVIX) 75 MG tablet Take 1 tablet (75 mg total) by mouth daily. 90 tablet 3  . collagenase (SANTYL) ointment Apply 1 application topically daily. 15 g 3  . Fluticasone-Salmeterol (ADVAIR DISKUS) 100-50 MCG/DOSE AEPB Inhale 1 puff into the lungs 2 (two) times daily. 3 each 3  . furosemide (LASIX) 80 MG tablet Take 1/2 tablet daily alternating with whole tablet daily 90 tablet 3  . ibuprofen (ADVIL,MOTRIN) 800 MG tablet Take 800 mg by mouth daily as needed for mild pain or moderate pain.    Marland Kitchen insulin aspart (NOVOLOG) 100 UNIT/ML injection Inject 30 Units into the skin 4 (four) times daily - after meals and at bedtime. First dose on Thu 01/13/17 at 0800 Correction coverage: Resistant (obese, steroids) CBG < 70: implement hypoglycemia protocol CBG 70 - 120: 0 units CBG 121 - 150: 2 units CBG 151 - 200: 4 units CBG 201 - 250: 7 units CBG 251 - 300: 9 units CBG 301 - 350: 12units CBG 351 - 400: 15 units CBG > 400: call MD     . isosorbide mononitrate (IMDUR) 30 MG 24 hr tablet Take 1 tablet (30 mg total) by mouth daily. 30 tablet 6  . losartan (COZAAR) 100 MG tablet Take 1 tablet (100  mg total) by mouth daily. 30 tablet 3  . metFORMIN (GLUCOPHAGE) 500 MG tablet Take 500 mg by mouth 2 (two) times daily with a meal.    . metoprolol tartrate (LOPRESSOR) 25 MG tablet Take 1 tablet (25 mg total) by mouth 2 (two) times daily. 180 tablet 2  . nitroGLYCERIN (NITROSTAT) 0.4 MG SL tablet Place 1 tablet (0.4 mg total) under the tongue every 5 (five) minutes as needed (up to 3 doses). For chest pain 25 tablet 3  . pioglitazone (ACTOS) 45 MG tablet Take 1 tablet (45 mg total) by mouth daily. 90 tablet 3  . potassium chloride SA (K-DUR,KLOR-CON) 20 MEQ tablet Take 1 tablet (20 mEq total) by mouth daily. 30 tablet 3  . pravastatin (PRAVACHOL) 40 MG tablet Take 1 tablet (40 mg total) by mouth daily. 90 tablet 3  . triamcinolone cream (KENALOG) 0.5 % Apply 1 application topically 2 (two) times daily as needed (rash). 30 g 3  . vitamin B-12 (CYANOCOBALAMIN) 1000 MCG tablet Take 1,000 mcg by mouth daily.     . Amino Acids-Protein Hydrolys (FEEDING SUPPLEMENT, PRO-STAT SUGAR FREE 64,) LIQD Take 30 mLs by mouth 2 (two) times daily.    Marland Kitchen gabapentin (NEURONTIN) 300 MG capsule Take 300 mg by mouth 2 (two) times daily. scheduled    .  gabapentin (NEURONTIN) 300 MG capsule Take 300 mg by mouth 3 (three) times daily as needed.     No facility-administered medications prior to visit.     ROS Review of Systems  Constitutional: Positive for fatigue. Negative for appetite change and unexpected weight change.  HENT: Negative for congestion, nosebleeds, sneezing, sore throat and trouble swallowing.   Eyes: Negative for itching and visual disturbance.  Respiratory: Negative for cough.   Cardiovascular: Positive for leg swelling. Negative for chest pain and palpitations.  Gastrointestinal: Negative for abdominal distention, blood in stool, diarrhea and nausea.  Genitourinary: Negative for frequency and hematuria.  Musculoskeletal: Positive for arthralgias and gait problem. Negative for back pain, joint  swelling and neck pain.  Skin: Positive for wound. Negative for rash.  Neurological: Positive for weakness. Negative for dizziness, tremors and speech difficulty.  Psychiatric/Behavioral: Positive for decreased concentration. Negative for agitation, dysphoric mood, sleep disturbance and suicidal ideas. The patient is not nervous/anxious.     Objective:  BP 100/60   Pulse 67   Temp 97.6 F (36.4 C) (Oral)   Resp 16   Ht 6\' 4"  (1.93 m)   Wt (!) 357 lb 12 oz (162.3 kg)   SpO2 96%   BMI 43.55 kg/m   BP Readings from Last 3 Encounters:  03/10/17 100/60  03/02/17 122/61  02/11/17 (!) 132/47    Wt Readings from Last 3 Encounters:  03/10/17 (!) 357 lb 12 oz (162.3 kg)  01/31/17 (!) 348 lb 8 oz (158.1 kg)  01/24/17 (!) 348 lb 8 oz (158.1 kg)    Physical Exam  Constitutional: He is oriented to person, place, and time. He appears well-developed. No distress.  NAD  HENT:  Mouth/Throat: Oropharynx is clear and moist.  Eyes: Conjunctivae are normal. Pupils are equal, round, and reactive to light.  Neck: Normal range of motion. No JVD present. No thyromegaly present.  Cardiovascular: Normal rate, regular rhythm, normal heart sounds and intact distal pulses.  Exam reveals no gallop and no friction rub.   No murmur heard. Pulmonary/Chest: Effort normal and breath sounds normal. No respiratory distress. He has no wheezes. He has no rales. He exhibits no tenderness.  Abdominal: Soft. Bowel sounds are normal. He exhibits no distension and no mass. There is no tenderness. There is no rebound and no guarding.  Musculoskeletal: Normal range of motion. He exhibits no edema or tenderness.  Lymphadenopathy:    He has no cervical adenopathy.  Neurological: He is alert and oriented to person, place, and time. He has normal reflexes. No cranial nerve deficit. He exhibits normal muscle tone. He displays a negative Romberg sign. Coordination and gait normal.  Skin: Skin is warm and dry. No rash noted.    Psychiatric: He has a normal mood and affect. His behavior is normal. Judgment and thought content normal.  [place] Deaf Walker Scaly LEs  Lab Results  Component Value Date   WBC 12.6 (H) 01/12/2017   HGB 12.3 (L) 01/12/2017   HCT 38.4 (L) 01/12/2017   PLT 247 01/12/2017   GLUCOSE 194 (H) 01/12/2017   CHOL 98 08/16/2016   TRIG 49 08/16/2016   HDL 33 (L) 08/16/2016   LDLCALC 55 08/16/2016   ALT 15 (L) 01/11/2017   AST 24 01/11/2017   NA 133 (L) 01/12/2017   K 4.6 01/12/2017   CL 101 01/12/2017   CREATININE 0.94 01/12/2017   BUN 26 (H) 01/12/2017   CO2 23 01/12/2017   TSH 1.205 01/10/2017   PSA 0.96  08/28/2010   INR 1.11 08/15/2016   HGBA1C 5.6 01/11/2017   MICROALBUR 1.4 08/28/2010    Dg Chest 2 View  Result Date: 01/10/2017 CLINICAL DATA:  Acute onset of worsening generalized weakness. Initial encounter. EXAM: CHEST  2 VIEW COMPARISON:  Chest radiograph performed 10/17/2016 FINDINGS: The lungs are well-aerated. Mild vascular congestion is suggested. Mild left basilar atelectasis is seen. There is no evidence of pleural effusion or pneumothorax. The heart is mildly enlarged. No acute osseous abnormalities are seen. IMPRESSION: Mild vascular congestion and mild cardiomegaly. Mild left basilar atelectasis noted. Lungs otherwise clear. Electronically Signed   By: Garald Balding M.D.   On: 01/10/2017 17:26    Assessment & Plan:   There are no diagnoses linked to this encounter. I have discontinued Mr. Domeier's feeding supplement (PRO-STAT SUGAR FREE 64). I am also having him maintain his vitamin B-12, triamcinolone cream, Fluticasone-Salmeterol, clopidogrel, furosemide, pioglitazone, pravastatin, potassium chloride SA, nitroGLYCERIN, aspirin EC, isosorbide mononitrate, losartan, collagenase, cholecalciferol, acetaminophen, metoprolol tartrate, insulin aspart, metFORMIN, ibuprofen, cephALEXin, and gabapentin.  Meds ordered this encounter  Medications  . gabapentin (NEURONTIN)  600 MG tablet     Follow-up: No Follow-up on file.  [name], MD

## 2017-03-10 NOTE — Assessment & Plan Note (Signed)
Losartan, Lopressor, Lasix, Pravachol,  Isosorbide

## 2017-03-10 NOTE — Assessment & Plan Note (Signed)
Resolved Probiotics

## 2017-03-10 NOTE — Assessment & Plan Note (Signed)
Cont w/ Losartan, Lopressor, Lasix, Pravachol, Plavix, ASA, Isosorbide

## 2017-03-10 NOTE — Assessment & Plan Note (Signed)
Metformin, Insulin

## 2017-03-10 NOTE — Assessment & Plan Note (Signed)
Wt Readings from Last 3 Encounters:  03/10/17 (!) 357 lb 12 oz (162.3 kg)  01/31/17 (!) 348 lb 8 oz (158.1 kg)  01/24/17 (!) 348 lb 8 oz (158.1 kg)

## 2017-03-10 NOTE — Assessment & Plan Note (Signed)
Healing

## 2017-03-10 NOTE — Assessment & Plan Note (Signed)
Losartan, Lopressor, Lasix, Isosorbide

## 2017-03-10 NOTE — Assessment & Plan Note (Signed)
No relapse 

## 2017-03-10 NOTE — Assessment & Plan Note (Signed)
Better  

## 2017-03-15 DIAGNOSIS — I5032 Chronic diastolic (congestive) heart failure: Secondary | ICD-10-CM | POA: Diagnosis not present

## 2017-03-15 DIAGNOSIS — J449 Chronic obstructive pulmonary disease, unspecified: Secondary | ICD-10-CM | POA: Diagnosis not present

## 2017-03-15 DIAGNOSIS — I110 Hypertensive heart disease with heart failure: Secondary | ICD-10-CM | POA: Diagnosis not present

## 2017-03-15 DIAGNOSIS — E11319 Type 2 diabetes mellitus with unspecified diabetic retinopathy without macular edema: Secondary | ICD-10-CM | POA: Diagnosis not present

## 2017-03-15 DIAGNOSIS — E1140 Type 2 diabetes mellitus with diabetic neuropathy, unspecified: Secondary | ICD-10-CM | POA: Diagnosis not present

## 2017-03-15 DIAGNOSIS — I248 Other forms of acute ischemic heart disease: Secondary | ICD-10-CM | POA: Diagnosis not present

## 2017-03-16 DIAGNOSIS — I248 Other forms of acute ischemic heart disease: Secondary | ICD-10-CM | POA: Diagnosis not present

## 2017-03-16 DIAGNOSIS — I5032 Chronic diastolic (congestive) heart failure: Secondary | ICD-10-CM | POA: Diagnosis not present

## 2017-03-16 DIAGNOSIS — E1140 Type 2 diabetes mellitus with diabetic neuropathy, unspecified: Secondary | ICD-10-CM | POA: Diagnosis not present

## 2017-03-16 DIAGNOSIS — E11319 Type 2 diabetes mellitus with unspecified diabetic retinopathy without macular edema: Secondary | ICD-10-CM | POA: Diagnosis not present

## 2017-03-16 DIAGNOSIS — I110 Hypertensive heart disease with heart failure: Secondary | ICD-10-CM | POA: Diagnosis not present

## 2017-03-16 DIAGNOSIS — J449 Chronic obstructive pulmonary disease, unspecified: Secondary | ICD-10-CM | POA: Diagnosis not present

## 2017-03-18 DIAGNOSIS — I5032 Chronic diastolic (congestive) heart failure: Secondary | ICD-10-CM | POA: Diagnosis not present

## 2017-03-18 DIAGNOSIS — J449 Chronic obstructive pulmonary disease, unspecified: Secondary | ICD-10-CM | POA: Diagnosis not present

## 2017-03-18 DIAGNOSIS — I248 Other forms of acute ischemic heart disease: Secondary | ICD-10-CM | POA: Diagnosis not present

## 2017-03-18 DIAGNOSIS — E11319 Type 2 diabetes mellitus with unspecified diabetic retinopathy without macular edema: Secondary | ICD-10-CM | POA: Diagnosis not present

## 2017-03-18 DIAGNOSIS — I110 Hypertensive heart disease with heart failure: Secondary | ICD-10-CM | POA: Diagnosis not present

## 2017-03-18 DIAGNOSIS — E1140 Type 2 diabetes mellitus with diabetic neuropathy, unspecified: Secondary | ICD-10-CM | POA: Diagnosis not present

## 2017-03-21 DIAGNOSIS — I5032 Chronic diastolic (congestive) heart failure: Secondary | ICD-10-CM | POA: Diagnosis not present

## 2017-03-21 DIAGNOSIS — I248 Other forms of acute ischemic heart disease: Secondary | ICD-10-CM | POA: Diagnosis not present

## 2017-03-21 DIAGNOSIS — E1140 Type 2 diabetes mellitus with diabetic neuropathy, unspecified: Secondary | ICD-10-CM | POA: Diagnosis not present

## 2017-03-21 DIAGNOSIS — J449 Chronic obstructive pulmonary disease, unspecified: Secondary | ICD-10-CM | POA: Diagnosis not present

## 2017-03-21 DIAGNOSIS — E11319 Type 2 diabetes mellitus with unspecified diabetic retinopathy without macular edema: Secondary | ICD-10-CM | POA: Diagnosis not present

## 2017-03-21 DIAGNOSIS — I110 Hypertensive heart disease with heart failure: Secondary | ICD-10-CM | POA: Diagnosis not present

## 2017-03-23 DIAGNOSIS — I248 Other forms of acute ischemic heart disease: Secondary | ICD-10-CM | POA: Diagnosis not present

## 2017-03-23 DIAGNOSIS — E1140 Type 2 diabetes mellitus with diabetic neuropathy, unspecified: Secondary | ICD-10-CM | POA: Diagnosis not present

## 2017-03-23 DIAGNOSIS — I5032 Chronic diastolic (congestive) heart failure: Secondary | ICD-10-CM | POA: Diagnosis not present

## 2017-03-23 DIAGNOSIS — E11319 Type 2 diabetes mellitus with unspecified diabetic retinopathy without macular edema: Secondary | ICD-10-CM | POA: Diagnosis not present

## 2017-03-23 DIAGNOSIS — I110 Hypertensive heart disease with heart failure: Secondary | ICD-10-CM | POA: Diagnosis not present

## 2017-03-23 DIAGNOSIS — J449 Chronic obstructive pulmonary disease, unspecified: Secondary | ICD-10-CM | POA: Diagnosis not present

## 2017-03-29 DIAGNOSIS — E11319 Type 2 diabetes mellitus with unspecified diabetic retinopathy without macular edema: Secondary | ICD-10-CM | POA: Diagnosis not present

## 2017-03-29 DIAGNOSIS — E1140 Type 2 diabetes mellitus with diabetic neuropathy, unspecified: Secondary | ICD-10-CM | POA: Diagnosis not present

## 2017-03-29 DIAGNOSIS — I110 Hypertensive heart disease with heart failure: Secondary | ICD-10-CM | POA: Diagnosis not present

## 2017-03-29 DIAGNOSIS — J449 Chronic obstructive pulmonary disease, unspecified: Secondary | ICD-10-CM | POA: Diagnosis not present

## 2017-03-29 DIAGNOSIS — I248 Other forms of acute ischemic heart disease: Secondary | ICD-10-CM | POA: Diagnosis not present

## 2017-03-29 DIAGNOSIS — I5032 Chronic diastolic (congestive) heart failure: Secondary | ICD-10-CM | POA: Diagnosis not present

## 2017-04-01 DIAGNOSIS — E11319 Type 2 diabetes mellitus with unspecified diabetic retinopathy without macular edema: Secondary | ICD-10-CM | POA: Diagnosis not present

## 2017-04-01 DIAGNOSIS — I5032 Chronic diastolic (congestive) heart failure: Secondary | ICD-10-CM | POA: Diagnosis not present

## 2017-04-01 DIAGNOSIS — I110 Hypertensive heart disease with heart failure: Secondary | ICD-10-CM | POA: Diagnosis not present

## 2017-04-01 DIAGNOSIS — I248 Other forms of acute ischemic heart disease: Secondary | ICD-10-CM | POA: Diagnosis not present

## 2017-04-01 DIAGNOSIS — J449 Chronic obstructive pulmonary disease, unspecified: Secondary | ICD-10-CM | POA: Diagnosis not present

## 2017-04-01 DIAGNOSIS — E1140 Type 2 diabetes mellitus with diabetic neuropathy, unspecified: Secondary | ICD-10-CM | POA: Diagnosis not present

## 2017-05-05 ENCOUNTER — Other Ambulatory Visit: Payer: Self-pay | Admitting: Internal Medicine

## 2017-05-13 DIAGNOSIS — I214 Non-ST elevation (NSTEMI) myocardial infarction: Secondary | ICD-10-CM

## 2017-05-13 HISTORY — DX: Non-ST elevation (NSTEMI) myocardial infarction: I21.4

## 2017-05-16 ENCOUNTER — Ambulatory Visit (INDEPENDENT_AMBULATORY_CARE_PROVIDER_SITE_OTHER): Payer: Medicare Other | Admitting: Internal Medicine

## 2017-05-16 ENCOUNTER — Encounter: Payer: Self-pay | Admitting: Internal Medicine

## 2017-05-16 DIAGNOSIS — E538 Deficiency of other specified B group vitamins: Secondary | ICD-10-CM | POA: Diagnosis not present

## 2017-05-16 DIAGNOSIS — I480 Paroxysmal atrial fibrillation: Secondary | ICD-10-CM | POA: Diagnosis not present

## 2017-05-16 DIAGNOSIS — E113399 Type 2 diabetes mellitus with moderate nonproliferative diabetic retinopathy without macular edema, unspecified eye: Secondary | ICD-10-CM

## 2017-05-16 DIAGNOSIS — Z794 Long term (current) use of insulin: Secondary | ICD-10-CM

## 2017-05-16 DIAGNOSIS — R0602 Shortness of breath: Secondary | ICD-10-CM

## 2017-05-16 DIAGNOSIS — E1165 Type 2 diabetes mellitus with hyperglycemia: Secondary | ICD-10-CM

## 2017-05-16 DIAGNOSIS — R234 Changes in skin texture: Secondary | ICD-10-CM | POA: Diagnosis not present

## 2017-05-16 DIAGNOSIS — I2510 Atherosclerotic heart disease of native coronary artery without angina pectoris: Secondary | ICD-10-CM

## 2017-05-16 DIAGNOSIS — I10 Essential (primary) hypertension: Secondary | ICD-10-CM | POA: Diagnosis not present

## 2017-05-16 MED ORDER — LOSARTAN POTASSIUM 100 MG PO TABS: 100.0000 mg | ORAL_TABLET | Freq: Every day | ORAL | 3 refills | Status: DC

## 2017-05-16 MED ORDER — PRAVASTATIN SODIUM 40 MG PO TABS: 40.0000 mg | ORAL_TABLET | Freq: Every day | ORAL | 3 refills | Status: DC

## 2017-05-16 MED ORDER — FUROSEMIDE 80 MG PO TABS: ORAL_TABLET | ORAL | 3 refills | Status: DC

## 2017-05-16 MED ORDER — ISOSORBIDE MONONITRATE ER 60 MG PO TB24: 60.0000 mg | ORAL_TABLET | Freq: Every day | ORAL | 3 refills | Status: DC

## 2017-05-16 MED ORDER — METFORMIN HCL 500 MG PO TABS: 500.0000 mg | ORAL_TABLET | Freq: Two times a day (BID) | ORAL | 3 refills | Status: DC

## 2017-05-16 MED ORDER — PIOGLITAZONE HCL 45 MG PO TABS: 45.0000 mg | ORAL_TABLET | Freq: Every day | ORAL | 3 refills | Status: DC

## 2017-05-16 MED ORDER — GABAPENTIN 300 MG PO CAPS: 300.0000 mg | ORAL_CAPSULE | Freq: Three times a day (TID) | ORAL | 3 refills | Status: DC

## 2017-05-16 MED ORDER — INSULIN ASPART 100 UNIT/ML FLEXPEN: 30.0000 [IU] | PEN_INJECTOR | Freq: Three times a day (TID) | SUBCUTANEOUS | 3 refills | Status: DC

## 2017-05-16 MED ORDER — NITROGLYCERIN 0.4 MG SL SUBL
0.4000 mg | SUBLINGUAL_TABLET | SUBLINGUAL | 3 refills | Status: DC | PRN
Start: 2017-05-16 — End: 2017-12-19

## 2017-05-16 MED ORDER — POTASSIUM CHLORIDE CRYS ER 20 MEQ PO TBCR: 20.0000 meq | EXTENDED_RELEASE_TABLET | Freq: Every day | ORAL | 3 refills | Status: DC

## 2017-05-16 MED ORDER — CLOPIDOGREL BISULFATE 75 MG PO TABS: 75.0000 mg | ORAL_TABLET | Freq: Every day | ORAL | 3 refills | Status: DC

## 2017-05-16 MED ORDER — ZOSTER VAC RECOMB ADJUVANTED 50 MCG/0.5ML IM SUSR: 0.5000 mL | Freq: Once | INTRAMUSCULAR | 1 refills | Status: AC

## 2017-05-16 MED ORDER — METOPROLOL TARTRATE 25 MG PO TABS
25.0000 mg | ORAL_TABLET | Freq: Two times a day (BID) | ORAL | 2 refills | Status: DC
Start: 2017-05-16 — End: 2017-08-16

## 2017-05-16 NOTE — Assessment & Plan Note (Signed)
Cont w/Losartan, Lopressor, Lasix, Pravachol, Plavix, ASA, Isosorbide

## 2017-05-16 NOTE — Progress Notes (Signed)
Subjective:  Patient ID: Charles [name], male    DOB: 1941/05/13  Age: 76 y.o. MRN: [id number]  CC: No chief complaint on file.   HPI Charles [name] presents for CAD, HTN, DM, CHF f/u   Outpatient Medications Prior to Visit  Medication Sig Dispense Refill  . acetaminophen (TYLENOL) 500 MG tablet Take 1,000 mg by mouth at bedtime.     Marland Kitchen aspirin EC 81 MG tablet Take 81 mg by mouth daily.     . cholecalciferol (VITAMIN D) 1000 units tablet Take 1,000 Units by mouth daily.     . clopidogrel (PLAVIX) 75 MG tablet Take 1 tablet (75 mg total) by mouth daily. 90 tablet 3  . clotrimazole-betamethasone (LOTRISONE) cream Apply 1 application topically 2 (two) times daily. 45 g 1  . collagenase (SANTYL) ointment Apply 1 application topically daily. 15 g 3  . Fluticasone-Salmeterol (ADVAIR DISKUS) 100-50 MCG/DOSE AEPB Inhale 1 puff into the lungs 2 (two) times daily. 3 each 3  . furosemide (LASIX) 80 MG tablet Take 1/2 tablet daily alternating with whole tablet daily 90 tablet 3  . gabapentin (NEURONTIN) 300 MG capsule 3 (three) times daily as needed.     Marland Kitchen ibuprofen (ADVIL,MOTRIN) 800 MG tablet Take 800 mg by mouth daily as needed for mild pain or moderate pain.    Marland Kitchen insulin aspart (NOVOLOG) 100 UNIT/ML injection Inject 30 Units into the skin 4 (four) times daily - after meals and at bedtime. First dose on Thu 01/13/17 at 0800 Correction coverage: Resistant (obese, steroids) CBG < 70: implement hypoglycemia protocol CBG 70 - 120: 0 units CBG 121 - 150: 2 units CBG 151 - 200: 4 units CBG 201 - 250: 7 units CBG 251 - 300: 9 units CBG 301 - 350: 12units CBG 351 - 400: 15 units CBG > 400: call MD     . isosorbide mononitrate (IMDUR) 60 MG 24 hr tablet Take 1 tablet (60 mg total) by mouth daily. 30 tablet 3  . losartan (COZAAR) 100 MG tablet Take 1 tablet (100 mg total) by mouth daily. 30 tablet 3  . metFORMIN (GLUCOPHAGE) 500 MG tablet Take 1 tablet (500 mg total) by mouth 2 (two) times daily with  a meal. 180 tablet 2  . metoprolol tartrate (LOPRESSOR) 25 MG tablet Take 1 tablet (25 mg total) by mouth 2 (two) times daily. 180 tablet 2  . nitroGLYCERIN (NITROSTAT) 0.4 MG SL tablet Place 1 tablet (0.4 mg total) under the tongue every 5 (five) minutes as needed (up to 3 doses). For chest pain 25 tablet 3  . pioglitazone (ACTOS) 45 MG tablet Take 1 tablet (45 mg total) by mouth daily. 90 tablet 3  . potassium chloride SA (K-DUR,KLOR-CON) 20 MEQ tablet Take 1 tablet (20 mEq total) by mouth daily. 30 tablet 3  . pravastatin (PRAVACHOL) 40 MG tablet Take 1 tablet (40 mg total) by mouth daily. 90 tablet 3  . triamcinolone cream (KENALOG) 0.5 % Apply 1 application topically 2 (two) times daily as needed (rash). 30 g 3  . vitamin B-12 (CYANOCOBALAMIN) 1000 MCG tablet Take 1,000 mcg by mouth daily.      No facility-administered medications prior to visit.     ROS Review of Systems  Constitutional: Positive for fatigue. Negative for appetite change and unexpected weight change.  HENT: Positive for hearing loss. Negative for congestion, nosebleeds, sneezing, sore throat and trouble swallowing.   Eyes: Negative for itching and visual disturbance.  Respiratory: Negative for cough.  Cardiovascular: Negative for chest pain, palpitations and leg swelling.  Gastrointestinal: Negative for abdominal distention, blood in stool, diarrhea and nausea.  Genitourinary: Negative for frequency and hematuria.  Musculoskeletal: Positive for gait problem. Negative for back pain, joint swelling and neck pain.  Skin: Positive for wound. Negative for rash.  Neurological: Negative for dizziness, tremors, speech difficulty and weakness.  Psychiatric/Behavioral: Negative for agitation, dysphoric mood and sleep disturbance. The patient is not nervous/anxious.     Objective:  BP 110/68 (BP Location: Left Arm, Patient Position: Sitting, Cuff Size: Large)   Pulse 69   Temp 97.7 F (36.5 C) (Oral)   Ht 6\' 4"  (1.93 m)    Wt (!) 362 lb (164.2 kg)   SpO2 98%   BMI 44.06 kg/m   BP Readings from Last 3 Encounters:  05/16/17 110/68  03/10/17 100/60  03/02/17 122/61    Wt Readings from Last 3 Encounters:  05/16/17 (!) 362 lb (164.2 kg)  03/10/17 (!) 357 lb 12 oz (162.3 kg)  01/31/17 (!) 348 lb 8 oz (158.1 kg)    Physical Exam  Constitutional: He is oriented to person, place, and time. He appears well-developed. No distress.  NAD  HENT:  Mouth/Throat: Oropharynx is clear and moist.  Eyes: Conjunctivae are normal. Pupils are equal, round, and reactive to light.  Neck: Normal range of motion. No JVD present. No thyromegaly present.  Cardiovascular: Normal rate, regular rhythm, normal heart sounds and intact distal pulses.  Exam reveals no gallop and no friction rub.   No murmur heard. Pulmonary/Chest: Effort normal and breath sounds normal. No respiratory distress. He has no wheezes. He has no rales. He exhibits no tenderness.  Abdominal: Soft. Bowel sounds are normal. He exhibits no distension and no mass. There is no tenderness. There is no rebound and no guarding.  Musculoskeletal: Normal range of motion. He exhibits edema. He exhibits no tenderness.  Lymphadenopathy:    He has no cervical adenopathy.  Neurological: He is alert and oriented to person, place, and time. He has normal reflexes. No cranial nerve deficit. He exhibits normal muscle tone. He displays a negative Romberg sign. Coordination abnormal. Gait normal.  Skin: Skin is warm and dry. No rash noted.  Psychiatric: He has a normal mood and affect. His behavior is normal. Judgment and thought content normal.  walker Trace edema Scaly skin on shins  Lab Results  Component Value Date   WBC 12.6 (H) 01/12/2017   HGB 12.3 (L) 01/12/2017   HCT 38.4 (L) 01/12/2017   PLT 247 01/12/2017   GLUCOSE 199 (H) 03/10/2017   CHOL 98 08/16/2016   TRIG 49 08/16/2016   HDL 33 (L) 08/16/2016   LDLCALC 55 08/16/2016   ALT 7 03/10/2017   AST 15  03/10/2017   NA 139 03/10/2017   K 3.8 03/10/2017   CL 99 03/10/2017   CREATININE 0.85 03/10/2017   BUN 13 03/10/2017   CO2 33 (H) 03/10/2017   TSH 1.205 01/10/2017   PSA 0.96 08/28/2010   INR 1.11 08/15/2016   HGBA1C 7.3 (H) 03/10/2017   MICROALBUR 1.4 08/28/2010    Dg Chest 2 View  Result Date: 01/10/2017 CLINICAL DATA:  Acute onset of worsening generalized weakness. Initial encounter. EXAM: CHEST  2 VIEW COMPARISON:  Chest radiograph performed 10/17/2016 FINDINGS: The lungs are well-aerated. Mild vascular congestion is suggested. Mild left basilar atelectasis is seen. There is no evidence of pleural effusion or pneumothorax. The heart is mildly enlarged. No acute osseous abnormalities are seen.  IMPRESSION: Mild vascular congestion and mild cardiomegaly. Mild left basilar atelectasis noted. Lungs otherwise clear. Electronically Signed   By: Garald Balding M.D.   On: 01/10/2017 17:26    Assessment & Plan:   There are no diagnoses linked to this encounter. I am having Mr. Allegretto maintain his vitamin B-12, triamcinolone cream, Fluticasone-Salmeterol, clopidogrel, furosemide, pioglitazone, pravastatin, potassium chloride SA, nitroGLYCERIN, aspirin EC, losartan, collagenase, cholecalciferol, acetaminophen, metoprolol tartrate, insulin aspart, ibuprofen, gabapentin, metFORMIN, isosorbide mononitrate, and clotrimazole-betamethasone.  No orders of the defined types were placed in this encounter.    Follow-up: No Follow-up on file.  [name], MD

## 2017-05-16 NOTE — Assessment & Plan Note (Signed)
On B12 

## 2017-05-16 NOTE — Patient Instructions (Signed)
Try GLYTONE exfoliating body lotion by Pierre Fabre (free acid value 17.5)  

## 2017-05-16 NOTE — Assessment & Plan Note (Addendum)
Cont w/Metformin, Actos, SS Novolog pen

## 2017-05-16 NOTE — Assessment & Plan Note (Addendum)
Try GLYTONE exfoliating body lotion by Pierre Fabre (free acid value 17.5)  

## 2017-05-16 NOTE — Assessment & Plan Note (Signed)
Lopressor, Plavix, ASA 

## 2017-05-24 ENCOUNTER — Other Ambulatory Visit: Payer: Self-pay | Admitting: Internal Medicine

## 2017-06-01 ENCOUNTER — Emergency Department (HOSPITAL_COMMUNITY): Payer: Medicare Other

## 2017-06-01 ENCOUNTER — Encounter (HOSPITAL_COMMUNITY)
Admission: EM | Disposition: A | Discharge status: Home / Self Care | Payer: Self-pay | Source: Home / Self Care | Attending: Internal Medicine

## 2017-06-01 ENCOUNTER — Encounter (HOSPITAL_COMMUNITY): Payer: Self-pay | Admitting: Emergency Medicine

## 2017-06-01 ENCOUNTER — Inpatient Hospital Stay (HOSPITAL_COMMUNITY)
Admission: EM | Admit: 2017-06-01 | Discharge: 2017-06-02 | DRG: 281 | Disposition: A | Discharge status: Home / Self Care | Payer: Medicare Other | Attending: Internal Medicine | Admitting: Internal Medicine

## 2017-06-01 DIAGNOSIS — Z794 Long term (current) use of insulin: Secondary | ICD-10-CM

## 2017-06-01 DIAGNOSIS — J449 Chronic obstructive pulmonary disease, unspecified: Secondary | ICD-10-CM | POA: Diagnosis present

## 2017-06-01 DIAGNOSIS — I2510 Atherosclerotic heart disease of native coronary artery without angina pectoris: Secondary | ICD-10-CM | POA: Diagnosis present

## 2017-06-01 DIAGNOSIS — I360 Nonrheumatic tricuspid (valve) stenosis: Secondary | ICD-10-CM | POA: Diagnosis not present

## 2017-06-01 DIAGNOSIS — IMO0002 Reserved for concepts with insufficient information to code with codable children: Secondary | ICD-10-CM | POA: Diagnosis present

## 2017-06-01 DIAGNOSIS — I5032 Chronic diastolic (congestive) heart failure: Secondary | ICD-10-CM | POA: Diagnosis present

## 2017-06-01 DIAGNOSIS — Z8673 Personal history of transient ischemic attack (TIA), and cerebral infarction without residual deficits: Secondary | ICD-10-CM | POA: Diagnosis not present

## 2017-06-01 DIAGNOSIS — Z6841 Body Mass Index (BMI) 40.0 and over, adult: Secondary | ICD-10-CM | POA: Diagnosis not present

## 2017-06-01 DIAGNOSIS — I10 Essential (primary) hypertension: Secondary | ICD-10-CM | POA: Diagnosis not present

## 2017-06-01 DIAGNOSIS — I480 Paroxysmal atrial fibrillation: Secondary | ICD-10-CM | POA: Diagnosis present

## 2017-06-01 DIAGNOSIS — E11319 Type 2 diabetes mellitus with unspecified diabetic retinopathy without macular edema: Secondary | ICD-10-CM | POA: Diagnosis present

## 2017-06-01 DIAGNOSIS — I25110 Atherosclerotic heart disease of native coronary artery with unstable angina pectoris: Secondary | ICD-10-CM | POA: Diagnosis present

## 2017-06-01 DIAGNOSIS — Z888 Allergy status to other drugs, medicaments and biological substances status: Secondary | ICD-10-CM | POA: Diagnosis not present

## 2017-06-01 DIAGNOSIS — R079 Chest pain, unspecified: Secondary | ICD-10-CM | POA: Diagnosis not present

## 2017-06-01 DIAGNOSIS — Z87891 Personal history of nicotine dependence: Secondary | ICD-10-CM | POA: Diagnosis not present

## 2017-06-01 DIAGNOSIS — I214 Non-ST elevation (NSTEMI) myocardial infarction: Principal | ICD-10-CM | POA: Diagnosis present

## 2017-06-01 DIAGNOSIS — J438 Other emphysema: Secondary | ICD-10-CM

## 2017-06-01 DIAGNOSIS — I110 Hypertensive heart disease with heart failure: Secondary | ICD-10-CM | POA: Diagnosis present

## 2017-06-01 DIAGNOSIS — Z79899 Other long term (current) drug therapy: Secondary | ICD-10-CM

## 2017-06-01 DIAGNOSIS — Z7982 Long term (current) use of aspirin: Secondary | ICD-10-CM

## 2017-06-01 DIAGNOSIS — R21 Rash and other nonspecific skin eruption: Secondary | ICD-10-CM | POA: Diagnosis present

## 2017-06-01 DIAGNOSIS — J439 Emphysema, unspecified: Secondary | ICD-10-CM

## 2017-06-01 DIAGNOSIS — E113553 Type 2 diabetes mellitus with stable proliferative diabetic retinopathy, bilateral: Secondary | ICD-10-CM

## 2017-06-01 DIAGNOSIS — E113399 Type 2 diabetes mellitus with moderate nonproliferative diabetic retinopathy without macular edema, unspecified eye: Secondary | ICD-10-CM | POA: Diagnosis not present

## 2017-06-01 DIAGNOSIS — R7989 Other specified abnormal findings of blood chemistry: Secondary | ICD-10-CM | POA: Diagnosis not present

## 2017-06-01 DIAGNOSIS — E1165 Type 2 diabetes mellitus with hyperglycemia: Secondary | ICD-10-CM

## 2017-06-01 HISTORY — DX: Non-ST elevation (NSTEMI) myocardial infarction: I21.4

## 2017-06-01 HISTORY — DX: Chronic obstructive pulmonary disease, unspecified: J44.9

## 2017-06-01 HISTORY — PX: LEFT HEART CATH AND CORONARY ANGIOGRAPHY: CATH118249

## 2017-06-01 LAB — GLUCOSE, CAPILLARY
GLUCOSE-CAPILLARY: 98 mg/dL (ref 65–99)
GLUCOSE-CAPILLARY: 205 mg/dL — AB (ref 65–99)
Glucose-Capillary: 109 mg/dL — ABNORMAL HIGH (ref 65–99)
Glucose-Capillary: 134 mg/dL — ABNORMAL HIGH (ref 65–99)

## 2017-06-01 LAB — BASIC METABOLIC PANEL
ANION GAP: 7 (ref 5–15)
Anion gap: 7 (ref 5–15)
BUN: 24 mg/dL — ABNORMAL HIGH (ref 6–20)
BUN: 24 mg/dL — ABNORMAL HIGH (ref 6–20)
CHLORIDE: 100 mmol/L — AB (ref 101–111)
CHLORIDE: 101 mmol/L (ref 101–111)
CO2: 29 mmol/L (ref 22–32)
CO2: 30 mmol/L (ref 22–32)
CREATININE: 1.13 mg/dL (ref 0.61–1.24)
CREATININE: 1.07 mg/dL (ref 0.61–1.24)
Calcium: 8.6 mg/dL — ABNORMAL LOW (ref 8.9–10.3)
Calcium: 8.7 mg/dL — ABNORMAL LOW (ref 8.9–10.3)
GFR calc Af Amer: >60 mL/min (ref >60)
GFR calc non Af Amer: >60 mL/min (ref >60)
GFR calc non Af Amer: >60 mL/min (ref >60)
Glucose, Bld: 211 mg/dL — ABNORMAL HIGH (ref 65–99)
Glucose, Bld: 96 mg/dL (ref 65–99)
Potassium: 4.1 mmol/L (ref 3.5–5.1)
Potassium: 3.9 mmol/L (ref 3.5–5.1)
SODIUM: 136 mmol/L (ref 135–145)
SODIUM: 138 mmol/L (ref 135–145)

## 2017-06-01 LAB — CBC
HCT: 31.2 % — ABNORMAL LOW (ref 39.0–52.0)
HCT: 33.1 % — ABNORMAL LOW (ref 39.0–52.0)
HEMOGLOBIN: 10.2 g/dL — AB (ref 13.0–17.0)
Hemoglobin: 9.9 g/dL — ABNORMAL LOW (ref 13.0–17.0)
MCH: 29.7 pg (ref 26.0–34.0)
MCH: 29.1 pg (ref 26.0–34.0)
MCHC: 31.7 g/dL (ref 30.0–36.0)
MCHC: 30.8 g/dL (ref 30.0–36.0)
MCV: 93.7 fL (ref 78.0–100.0)
MCV: 94.6 fL (ref 78.0–100.0)
PLATELETS: 260 10*3/uL (ref 150–400)
Platelets: 285 10*3/uL (ref 150–400)
RBC: 3.33 MIL/uL — AB (ref 4.22–5.81)
RBC: 3.5 MIL/uL — ABNORMAL LOW (ref 4.22–5.81)
RDW: 16.1 % — ABNORMAL HIGH (ref 11.5–15.5)
RDW: 16.3 % — ABNORMAL HIGH (ref 11.5–15.5)
WBC: 5.5 10*3/uL (ref 4.0–10.5)
WBC: 5.7 10*3/uL (ref 4.0–10.5)

## 2017-06-01 LAB — I-STAT TROPONIN, ED: TROPONIN I, POC: 1.05 ng/mL — AB (ref 0.00–0.08)

## 2017-06-01 LAB — TROPONIN I
Troponin I: 1.44 ng/mL (ref <0.03)
Troponin I: 1.41 ng/mL (ref <0.03)
Troponin I: 1.15 ng/mL (ref <0.03)

## 2017-06-01 LAB — PROTIME-INR
INR: 1.06
Prothrombin Time: 13.9 seconds (ref 11.4–15.2)

## 2017-06-01 LAB — LIPID PANEL
CHOLESTEROL: 131 mg/dL (ref 0–200)
HDL: 45 mg/dL (ref >40)
LDL CALC: 80 mg/dL (ref 0–99)
Total CHOL/HDL Ratio: 2.9 RATIO
Triglycerides: 31 mg/dL (ref <150)
VLDL: 6 mg/dL (ref 0–40)

## 2017-06-01 LAB — MRSA PCR SCREENING: MRSA by PCR: NEGATIVE

## 2017-06-01 LAB — HEPARIN LEVEL (UNFRACTIONATED): Heparin Unfractionated: 0.74 IU/mL — ABNORMAL HIGH (ref 0.30–0.70)

## 2017-06-01 LAB — TSH: TSH: 2.584 u[IU]/mL (ref 0.350–4.500)

## 2017-06-01 SURGERY — LEFT HEART CATH AND CORONARY ANGIOGRAPHY
Anesthesia: LOCAL

## 2017-06-01 MED ORDER — HEPARIN BOLUS VIA INFUSION
4000.0000 [IU] | Freq: Once | INTRAVENOUS | Status: AC
  Administered 2017-06-01: 4000 [IU] via INTRAVENOUS
  Filled 2017-06-01: qty 4000

## 2017-06-01 MED ORDER — NITROGLYCERIN 0.4 MG SL SUBL: 0.4000 mg | SUBLINGUAL_TABLET | SUBLINGUAL | Status: DC | PRN

## 2017-06-01 MED ORDER — INSULIN ASPART 100 UNIT/ML ~~LOC~~ SOLN
0.0000 [IU] | Freq: Three times a day (TID) | SUBCUTANEOUS | Status: DC
  Administered 2017-06-02 (×2): 2 [IU] via SUBCUTANEOUS

## 2017-06-01 MED ORDER — SODIUM CHLORIDE 0.9 % IV SOLN
INTRAVENOUS | Status: AC
  Administered 2017-06-01: 18:00:00 via INTRAVENOUS

## 2017-06-01 MED ORDER — VITAMIN D 1000 UNITS PO TABS
1000.0000 [IU] | ORAL_TABLET | Freq: Every day | ORAL | Status: DC
  Administered 2017-06-02: 1000 [IU] via ORAL
  Filled 2017-06-01 (×2): qty 1

## 2017-06-01 MED ORDER — INSULIN ASPART 100 UNIT/ML ~~LOC~~ SOLN
6.0000 [IU] | Freq: Once | SUBCUTANEOUS | Status: AC
  Administered 2017-06-01: 6 [IU] via SUBCUTANEOUS

## 2017-06-01 MED ORDER — SODIUM CHLORIDE 0.9 % WEIGHT BASED INFUSION: 1.0000 mL/kg/h | INTRAVENOUS | Status: DC

## 2017-06-01 MED ORDER — SODIUM CHLORIDE 0.9% FLUSH
3.0000 mL | Freq: Two times a day (BID) | INTRAVENOUS | Status: DC
  Administered 2017-06-01 – 2017-06-02 (×2): 3 mL via INTRAVENOUS

## 2017-06-01 MED ORDER — HEPARIN (PORCINE) IN NACL 2-0.9 UNIT/ML-% IJ SOLN
INTRAMUSCULAR | Status: DC | PRN
  Administered 2017-06-01: 17:00:00

## 2017-06-01 MED ORDER — MOMETASONE FURO-FORMOTEROL FUM 100-5 MCG/ACT IN AERO
2.0000 | INHALATION_SPRAY | Freq: Two times a day (BID) | RESPIRATORY_TRACT | Status: DC
  Filled 2017-06-01: qty 8.8

## 2017-06-01 MED ORDER — LIDOCAINE HCL (PF) 1 % IJ SOLN
INTRAMUSCULAR | Status: DC | PRN
  Administered 2017-06-01: 15 mL
  Administered 2017-06-01: 2 mL

## 2017-06-01 MED ORDER — HEPARIN (PORCINE) IN NACL 100-0.45 UNIT/ML-% IJ SOLN
1650.0000 [IU]/h | INTRAMUSCULAR | Status: DC
  Administered 2017-06-01: 1750 [IU]/h via INTRAVENOUS
  Administered 2017-06-01: 1650 [IU]/h via INTRAVENOUS
  Filled 2017-06-01 (×2): qty 250

## 2017-06-01 MED ORDER — GABAPENTIN 300 MG PO CAPS
300.0000 mg | ORAL_CAPSULE | Freq: Three times a day (TID) | ORAL | Status: DC
  Administered 2017-06-01 – 2017-06-02 (×3): 300 mg via ORAL
  Filled 2017-06-01 (×4): qty 1

## 2017-06-01 MED ORDER — VERAPAMIL HCL 2.5 MG/ML IV SOLN
INTRAVENOUS | Status: DC | PRN
  Administered 2017-06-01: 10 mL via INTRA_ARTERIAL

## 2017-06-01 MED ORDER — HEPARIN (PORCINE) IN NACL 2-0.9 UNIT/ML-% IJ SOLN
INTRAMUSCULAR | Status: AC
Start: 2017-06-01 — End: ?
  Filled 2017-06-01: qty 1000

## 2017-06-01 MED ORDER — POTASSIUM CHLORIDE CRYS ER 20 MEQ PO TBCR
20.0000 meq | EXTENDED_RELEASE_TABLET | Freq: Every day | ORAL | Status: DC
  Administered 2017-06-02: 20 meq via ORAL
  Filled 2017-06-01 (×2): qty 1

## 2017-06-01 MED ORDER — SODIUM CHLORIDE 0.9% FLUSH
3.0000 mL | Freq: Two times a day (BID) | INTRAVENOUS | Status: DC
  Administered 2017-06-01: 3 mL via INTRAVENOUS

## 2017-06-01 MED ORDER — METOPROLOL TARTRATE 25 MG PO TABS
25.0000 mg | ORAL_TABLET | Freq: Two times a day (BID) | ORAL | Status: DC
  Administered 2017-06-01 – 2017-06-02 (×3): 25 mg via ORAL
  Filled 2017-06-01 (×3): qty 1

## 2017-06-01 MED ORDER — INSULIN ASPART 100 UNIT/ML ~~LOC~~ SOLN: 0.0000 [IU] | Freq: Every day | SUBCUTANEOUS | Status: DC

## 2017-06-01 MED ORDER — SODIUM CHLORIDE 0.9 % IV SOLN
250.0000 mL | INTRAVENOUS | Status: DC | PRN
  Administered 2017-06-02: 250 mL via INTRAVENOUS

## 2017-06-01 MED ORDER — SODIUM CHLORIDE 0.9% FLUSH: 3.0000 mL | INTRAVENOUS | Status: DC | PRN

## 2017-06-01 MED ORDER — LIDOCAINE HCL 1 % IJ SOLN
INTRAMUSCULAR | Status: AC
Start: 2017-06-01 — End: ?
  Filled 2017-06-01: qty 20

## 2017-06-01 MED ORDER — ONDANSETRON HCL 4 MG/2ML IJ SOLN: 4.0000 mg | Freq: Four times a day (QID) | INTRAMUSCULAR | Status: DC | PRN

## 2017-06-01 MED ORDER — ACETAMINOPHEN 325 MG PO TABS
650.0000 mg | ORAL_TABLET | ORAL | Status: DC | PRN
  Administered 2017-06-01: 650 mg via ORAL
  Filled 2017-06-01: qty 2

## 2017-06-01 MED ORDER — ASPIRIN EC 81 MG PO TBEC
81.0000 mg | DELAYED_RELEASE_TABLET | Freq: Every day | ORAL | Status: DC
  Administered 2017-06-01 – 2017-06-02 (×2): 81 mg via ORAL
  Filled 2017-06-01 (×2): qty 1

## 2017-06-01 MED ORDER — FENTANYL CITRATE (PF) 100 MCG/2ML IJ SOLN
INTRAMUSCULAR | Status: DC | PRN
  Administered 2017-06-01: 25 ug via INTRAVENOUS

## 2017-06-01 MED ORDER — MORPHINE SULFATE (PF) 2 MG/ML IV SOLN: 2.0000 mg | INTRAVENOUS | Status: DC | PRN

## 2017-06-01 MED ORDER — IOPAMIDOL (ISOVUE-370) INJECTION 76%
INTRAVENOUS | Status: AC
Start: 2017-06-01 — End: ?
  Filled 2017-06-01: qty 100

## 2017-06-01 MED ORDER — SODIUM CHLORIDE 0.9 % IV SOLN: 250.0000 mL | INTRAVENOUS | Status: DC | PRN

## 2017-06-01 MED ORDER — HEPARIN SODIUM (PORCINE) 1000 UNIT/ML IJ SOLN
INTRAMUSCULAR | Status: AC
  Filled 2017-06-01: qty 1

## 2017-06-01 MED ORDER — FENTANYL CITRATE (PF) 100 MCG/2ML IJ SOLN
INTRAMUSCULAR | Status: AC
  Filled 2017-06-01: qty 2

## 2017-06-01 MED ORDER — SODIUM CHLORIDE 0.9 % WEIGHT BASED INFUSION
3.0000 mL/kg/h | INTRAVENOUS | Status: DC
  Administered 2017-06-01: 3 mL/kg/h via INTRAVENOUS

## 2017-06-01 MED ORDER — FUROSEMIDE 40 MG PO TABS
40.0000 mg | ORAL_TABLET | Freq: Every day | ORAL | Status: DC
  Administered 2017-06-02: 40 mg via ORAL
  Filled 2017-06-01 (×2): qty 1

## 2017-06-01 MED ORDER — INSULIN ASPART 100 UNIT/ML ~~LOC~~ SOLN
15.0000 [IU] | Freq: Three times a day (TID) | SUBCUTANEOUS | Status: DC
  Administered 2017-06-02 (×2): 15 [IU] via SUBCUTANEOUS

## 2017-06-01 MED ORDER — VERAPAMIL HCL 2.5 MG/ML IV SOLN
INTRAVENOUS | Status: AC
  Filled 2017-06-01: qty 2

## 2017-06-01 MED ORDER — IOPAMIDOL (ISOVUE-370) INJECTION 76%
INTRAVENOUS | Status: DC | PRN
  Administered 2017-06-01: 65 mL via INTRAVENOUS

## 2017-06-01 MED ORDER — ATORVASTATIN CALCIUM 80 MG PO TABS
80.0000 mg | ORAL_TABLET | Freq: Every day | ORAL | Status: DC
  Administered 2017-06-01 – 2017-06-02 (×2): 80 mg via ORAL
  Filled 2017-06-01 (×2): qty 1

## 2017-06-01 MED ORDER — HEPARIN (PORCINE) IN NACL 100-0.45 UNIT/ML-% IJ SOLN
1750.0000 [IU]/h | INTRAMUSCULAR | Status: DC
  Administered 2017-06-02: 1650 [IU]/h via INTRAVENOUS
  Administered 2017-06-02: 1750 [IU]/h via INTRAVENOUS
  Filled 2017-06-01: qty 250

## 2017-06-01 MED ORDER — CLOPIDOGREL BISULFATE 75 MG PO TABS
75.0000 mg | ORAL_TABLET | Freq: Once | ORAL | Status: AC
  Administered 2017-06-01: 75 mg via ORAL
  Filled 2017-06-01: qty 1

## 2017-06-01 MED ORDER — INSULIN ASPART 100 UNIT/ML ~~LOC~~ SOLN: 0.0000 [IU] | Freq: Three times a day (TID) | SUBCUTANEOUS | Status: DC

## 2017-06-01 MED ORDER — HEPARIN SODIUM (PORCINE) 1000 UNIT/ML IJ SOLN
INTRAMUSCULAR | Status: DC | PRN
  Administered 2017-06-01: 7000 [IU] via INTRAVENOUS

## 2017-06-01 MED ORDER — ASPIRIN 81 MG PO CHEW: 81.0000 mg | CHEWABLE_TABLET | ORAL | Status: DC

## 2017-06-01 SURGICAL SUPPLY — 15 items
CATH INFINITI 5FR MULTPACK ANG (CATHETERS) ×1 IMPLANT
COVER PRB 48X5XTLSCP FOLD TPE (BAG) IMPLANT
COVER PROBE 5X48 (BAG) ×2
DEVICE RAD COMP TR BAND LRG (VASCULAR PRODUCTS) ×1 IMPLANT
GLIDESHEATH SLEND SS 6F .021 (SHEATH) ×1 IMPLANT
GUIDEWIRE INQWIRE 1.5J.035X260 (WIRE) IMPLANT
HOVERMATT SINGLE USE (MISCELLANEOUS) ×1 IMPLANT
INQWIRE 1.5J .035X260CM (WIRE) ×2
KIT HEART LEFT (KITS) ×2 IMPLANT
PACK CARDIAC CATHETERIZATION (CUSTOM PROCEDURE TRAY) ×2 IMPLANT
SHEATH PINNACLE 5F 10CM (SHEATH) ×1 IMPLANT
TRANSDUCER W/STOPCOCK (MISCELLANEOUS) ×2 IMPLANT
TUBING CIL FLEX 10 FLL-RA (TUBING) ×2 IMPLANT
WIRE EMERALD 3MM-J .035X150CM (WIRE) ×1 IMPLANT
WIRE HI TORQ VERSACORE-J 145CM (WIRE) ×1 IMPLANT

## 2017-06-01 NOTE — H&P (View-Only) (Signed)
Progress Note  Patient Name: Charles [name] Date of Encounter: 06/01/2017    Subjective  No current CP. Right pretibial region hurts after fall about a week ago. Tender to touch. Able to walk on it however.   Inpatient Medications    Scheduled Meds: . aspirin EC  81 mg Oral Daily  . atorvastatin  80 mg Oral q1800  . cholecalciferol  1,000 Units Oral Daily  . furosemide  40 mg Oral Daily  . gabapentin  300 mg Oral TID  . insulin aspart  0-15 Units Subcutaneous TID WC  . insulin aspart  0-5 Units Subcutaneous QHS  . insulin aspart  15 Units Subcutaneous TID WC  . metoprolol tartrate  25 mg Oral BID  . mometasone-formoterol  2 puff Inhalation BID  . potassium chloride SA  20 mEq Oral Daily   Continuous Infusions: . heparin 1,750 Units/hr (06/01/17 0320)   PRN Meds: acetaminophen, morphine injection, nitroGLYCERIN, ondansetron (ZOFRAN) IV   Vital Signs    Vitals:   06/01/17 0530 06/01/17 0534 06/01/17 0744 06/01/17 0815  BP:  (!) 114/46    Pulse:  77 74   Resp:  18 (!) 21   Temp:  97.7 F (36.5 C)  98.1 F (36.7 C)  TempSrc:  Oral  Axillary  SpO2:  98% 91%   Weight: (!) 366 lb 6.4 oz (166.2 kg)     Height: 6\' 4"  (1.93 m)      No intake or output data in the 24 hours ending 06/01/17 1108 Filed Weights   06/01/17 0118 06/01/17 0530  Weight: (!) 370 lb (167.8 kg) (!) 366 lb 6.4 oz (166.2 kg)    Telemetry    Sinus rhythm, PACs - Personally Reviewed  ECG    Sinus rhythm with nonspecific ST-T wave changes, T-wave inversions inferior - Personally Reviewed  Physical Exam   GEN: No acute distress.   Neck: No JVD Cardiac: RRR, no murmurs, rubs, or gallops.  Respiratory: Clear to auscultation bilaterally. GI: Soft, nontender, non-distended Obese MS: No edema; No deformity. Tender right pretibial region, minor ecchymosis at site of prior fall. Neuro:  Nonfocal  Psych: Normal affect   Labs    Chemistry Recent Labs Lab 06/01/17 0131 06/01/17 0646  NA 136  138  K 4.1 3.9  CL 100* 101  CO2 29 30  GLUCOSE 211* 96  BUN 24* 24*  CREATININE 1.13 1.07  CALCIUM 8.6* 8.7*  GFRNONAA >60 >60  GFRAA >60 >60  ANIONGAP 7 7     Hematology Recent Labs Lab 06/01/17 0131 06/01/17 0646  WBC 5.5 5.7  RBC 3.33* 3.50*  HGB 9.9* 10.2*  HCT 31.2* 33.1*  MCV 93.7 94.6  MCH 29.7 29.1  MCHC 31.7 30.8  RDW 16.1* 16.3*  PLT 260 285    Cardiac Enzymes Recent Labs Lab 06/01/17 0646  TROPONINI 1.44*    Recent Labs Lab 06/01/17 0141  TROPIPOC 1.05*     BNPNo results for input(s): BNP, PROBNP in the last 168 hours.   DDimer No results for input(s): DDIMER in the last 168 hours.   Radiology    Dg Chest 2 View  Result Date: 06/01/2017 CLINICAL DATA:  Acute onset of generalized chest pain and shortness of breath. Initial encounter. EXAM: CHEST  2 VIEW COMPARISON:  Chest radiograph performed 01/10/2017 FINDINGS: The lungs are well-aerated. Minimal bibasilar atelectasis is noted. There is no evidence of pleural effusion or pneumothorax. The heart is borderline normal in size. No acute osseous abnormalities  are seen. IMPRESSION: Minimal bibasilar atelectasis noted.  Lungs otherwise clear. Electronically Signed   By: Garald Balding M.D.   On: 06/01/2017 01:55    Cardiac Studies   Echocardiogram 01/12/17 - Left ventricle: The cavity size was normal. Wall thickness was   increased in a pattern of mild LVH. Systolic function was normal.   The estimated ejection fraction was in the range of 60% to 65%.   Wall motion was normal; there were no regional wall motion   abnormalities. Features are consistent with a pseudonormal left   ventricular filling pattern, with concomitant abnormal relaxation   and increased filling pressure (grade 2 diastolic dysfunction). - Aortic valve: Moderately calcified annulus. - Mitral valve: Mildly calcified annulus. - Left atrium: The atrium was mildly dilated. - Right atrium: The atrium was mildly dilated. - Pulmonary  arteries: Systolic pressure was mildly increased. PA   peak pressure: 35 mm Hg (S).  Impressions:  - The echo is of very poor quality - technically difficult.   The valves are not seen well at all.  Cardiac catheterization 9/5-06/2016    Prox RCA lesion, 90 %stenosed.  Post intervention, there is a 90% residual stenosis.   1. Unsuccessful PCI of the proximal RCA due to inability to cross with stent or adequate sized balloon. 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  Plan: aggressive medical therapy. If patient has refractory limiting angina would need to consider for CABG.   [place]  Diagnostic Diagram       Post-Intervention Diagram          Patient Profile     76 y.o. male with non-ST elevation myocardial infarction  Assessment & Plan    Non-ST elevation myocardial infarction  - IV heparin, beta blocker as tolerated, nitroglycerin as needed, statin, holding Plavix  - Proceeding with cardiac catheterization. Risks and benefits explained including stroke heart attack death, renal impairment, bleeding.  - Prior cardiac catheterization reviewed from September 2017, Dr. Judee Clara to stent right coronary artery successfully, inability to cross lesion with stent, shepherd's crook.  - I think it is prudent to reassess his coronary anatomy given his current non-STEMI and if necessary, consult surgery for right of refusal for bypass. He has not had an official consultation from them.   - Pain described as squeezing chest pain consistent with prior anginal symptoms. T-wave inversions noted inferior leads.  Essential hypertension  - Currently hypotensive, holding antihypertensives except for metoprolol.  COPD  - Home inhalers  Diabetes with hypertension  - Holding by mouth meds. Insulin sliding scale.  Chronic diastolic heart failure  - Related to his morbid obesity  - Home dose  Lasix.  Morbid obesity  - Encourage weight loss.  Signed, Candee Furbish, MD  06/01/2017, 11:08 AM

## 2017-06-01 NOTE — ED Notes (Signed)
Heparin verified with [facility] RN

## 2017-06-01 NOTE — Procedures (Signed)
Placed patient on CPAP for the night.  Patient is tolerating well at this time. 

## 2017-06-01 NOTE — Progress Notes (Signed)
ANTICOAGULATION CONSULT NOTE - Follow Up Consult  Pharmacy Consult for Heparin Indication: CAD s/p cath pending TCTS eval  Allergies  Allergen Reactions  . Xarelto [Rivaroxaban] Rash    Patient Measurements: Height: 6\' 4"  (193 cm) Weight: (!) 366 lb 6.4 oz (166.2 kg) IBW/kg (Calculated) : 86.8 Heparin Dosing Weight: 125 kg  Vital Signs: Temp: 98.7 F (37.1 C) (06/20 1300) Temp Source: Oral (06/20 1300) BP: 132/62 (06/20 1701) Pulse Rate: 85 (06/20 1701)  Labs:  Recent Labs  06/01/17 0131 06/01/17 0646 06/01/17 1151  HGB 9.9* 10.2*  --   HCT 31.2* 33.1*  --   PLT 260 285  --   LABPROT  --   --  13.9  INR  --   --  1.06  HEPARINUNFRC  --   --  0.74*  CREATININE 1.13 1.07  --   TROPONINI  --  1.44* 1.41*    Estimated Creatinine Clearance: 100.1 mL/min (by C-G formula based on SCr of 1.07 mg/dL).   Medications:  Scheduled:  . aspirin EC  81 mg Oral Daily  . atorvastatin  80 mg Oral q1800  . cholecalciferol  1,000 Units Oral Daily  . furosemide  40 mg Oral Daily  . gabapentin  300 mg Oral TID  . insulin aspart  0-15 Units Subcutaneous TID WC  . insulin aspart  0-5 Units Subcutaneous QHS  . insulin aspart  15 Units Subcutaneous TID WC  . metoprolol tartrate  25 mg Oral BID  . mometasone-formoterol  2 puff Inhalation BID  . potassium chloride SA  20 mEq Oral Daily  . sodium chloride flush  3 mL Intravenous Q12H   Infusions:  . sodium chloride    . sodium chloride      Assessment: 76 yo M admitted with CP/pressure.  Pt is s/p cardiac cath today which showed severe 3v disease.  Have attempted PCI of these vessels in the past, unsuccessfully.  Plan to consult CT surgery.  Restart heparin 8 hours after sheath pull.  Sheath out at 1705 and TR band placed.  Goal of Therapy:  Heparin level 0.3-0.7 units/ml Monitor platelets by anticoagulation protocol: Yes   Plan:  Heparin to start 6/21 at 0100.   Heparin infusion at 1650 units/hr Heparin level at 0900  6/21. Heparin level and CBC daily while on heparin.  [employer], Pharm.D., BCPS Clinical Pharmacist Pager: [phone] 06/01/2017 6:03 PM

## 2017-06-01 NOTE — H&P (Signed)
CARDIOLOGY H&P Primary Cardiologist: Dr. Harrington Challenger  HPI: Mr Stapel is a 76 yo male with a pmhx of HTN, pAF (not on [facility]), CVA, HLD, obesity and CAD who presents to the [facility] for evaluation of chest pain.  Pt states that this evening around 2300 he began to experience 7/10, left sided, "squeezing" chest pain which did not radiate and was consistent with his prior episodes of angina.  He proceeded to take 4 SL NTG prior to achieving any relief, which is significantly more than he usually has to take.  Given that the symptoms were more severe and lasting longer than usual, he took 4 baby ASA and called EMS who transported him to our facility for further evaluation.  By the time he arrived at our facility, he was chest pain free.  Initial troponin was elevated at 1.05 and EKG showed some new TWI in the inferior leads along with nonspecific ST-T wave abn in the lateral leads.  Cardiology asked to evaluate for NSTEMI admission.  He denied nausea, vomiting, dizziness, palpitations, SOB, orthopnea, or PND. He has been compliant with his medications and last took his plavix this evening.   Pt notes that in the weeks leading up to this event, he has been experiencing more anginal episodes that previously.    Per Dr. Harrington Challenger' note on 11/22/16: "He was admitted with [place] earlier this fall  Cath showed high grade severe 2 vessel obstructive disease with severe diffuse distal LAD disease (chronic and unchanged) as well as severe proximal RCA stenosis (This has progressed significantly from 2013 and appears to be the culprit lesion. It is a very complex lesion with a severe Shepherd's crook deformity, heavy calcification and filling defect consistent with thrombus.) LVEF 55-65%.  He was brought back to the lab 08/19/16 but had unsuccessful PCI of the proximal RCA due to inability to cross with stent or adequate sized balloon. This is related to severe tortuosity of the vessel with a severe Shepherd's crook deformity and heavy  calcification. It was felt that if the patient had refractory limiting angina would need to consider for CABG"     Review of Systems:     Cardiac Review of Systems: {Y] = yes [ ]  = no  Chest Pain [ Y   ]  Resting SOB [   ] Exertional SOB  [  ]  Orthopnea [  ]   Pedal Edema [  Y ]    Palpitations [  ] Syncope  [  ]   Presyncope [   ]  General Review of Systems: [Y] = yes [  ]=no Constitional: recent weight change [  ]; anorexia [  ]; fatigue [  ]; nausea [  ]; night sweats [  ]; fever [  ]; or chills [  ];                                                                     Dental: poor dentition[  ];   Eye : blurred vision [  ]; diplopia [   ]; vision changes [  ];  Amaurosis fugax[  ]; Resp: cough [  ];  wheezing[  ];  hemoptysis[  ]; shortness of breath[  ]; paroxysmal nocturnal dyspnea[  ];  dyspnea on exertion[  ]; or orthopnea[  ];  GI:  gallstones[  ], vomiting[  ];  dysphagia[  ]; melena[  ];  hematochezia [  ]; heartburn[  ];   GU: kidney stones [  ]; hematuria[  ];   dysuria [  ];  nocturia[  ];               Skin: rash [ Y ], swelling[ Y ];, hair loss[  ];  peripheral edema[Y ];  or itching[  ]; Musculosketetal: myalgias[  ];  joint swelling[  ];  joint erythema[  ];  joint pain[  ];  back pain[  ];  Heme/Lymph: bruising[  ];  bleeding[  ];  anemia[  ];  Neuro: TIA[  ];  headaches[  ];  stroke[  ];  vertigo[  ];  seizures[  ];   paresthesias[  ];  difficulty walking[  ];  Psych:depression[  ]; anxiety[  ];  Endocrine: diabetes[  ];  thyroid dysfunction[  ];  Other:  Past Medical History:  Diagnosis Date  . Arthritis   . B12 deficiency   . CAD (coronary artery disease)    a. Approx. 2000 - MI. Cath: single vsl dz, PTCA dLAD/medical rx. ;  b. NSTEMI 11/13 => IVUS attempted for RCA but not successful; anatomy felt stable from 2000 => med Rx. c. [place] 08/2016: severe stable diffuse dLAD, severe prox RCA -> PCI of RCA unsuccessful, consider CABG for refractory angina.  . Cancer ([place])     skin cancer- head   . Cataracts   . Cholelithiasis   . Chronic diastolic CHF (congestive heart failure) ([place])   . Claustrophobia   . CVA (cerebral vascular accident) ([place]) ~ 2001   vision inparted from stroke.  Visual Memory loss  . Diabetic neuropathy ([place])   . Diverticulitis   . Dysrhythmia    if he does not take Metoprolol- Afib  . ED (erectile dysfunction)   . Essential hypertension   . History of MRSA infection    Right foot  . History of stomach ulcers   . HOH (hard of hearing)   . Hypercholesterolemia   . Legally blind   . Lower extremity edema   . Morbid obesity ([place])   . Neuropathy   . OSA on CPAP   . PAF (paroxysmal atrial fibrillation) ([name])    a. confirmed by event monitor. b. 02/2014 rash on Coumadin, patient decided to discontinue Xarelto due to possible rash, cost and lawyers ads on TV, agreed to take Plavix.  . Pneumonia 2016  . Tunnel vision    Since stroke  . Type II diabetes mellitus ([place])   . Venous stasis   . Weakness 01/11/2017    @[facility] @   Allergies  Allergen Reactions  . Xarelto [Rivaroxaban] Rash    Social History   Social History  . Marital status: Married    Spouse name: N/A  . Number of children: N/A  . Years of education: N/A   Occupational History  . Not on file.   Social History Main Topics  . Smoking status: Former Smoker    Packs/day: 1.00    Years: 30.00    Types: Cigarettes, Cigars  . Smokeless tobacco: Former Systems developer    Quit date: 08/15/2016     Comment: 12/2016 quit smoking in 1998  . Alcohol use Yes     Comment: 12/2016   rare   . Drug use: No  . Sexual activity: Yes   Other  Topics Concern  . Not on file   Social History Narrative   Disabled s/p CVA    Family History  Problem Relation Age of Onset  . Breast cancer Mother 52  . Heart disease Father 36  . Asthma Neg Hx     PHYSICAL EXAM: Vitals:   06/01/17 0200 06/01/17 0215  BP: (!) 89/48 (!) 94/54  Pulse:  81  Resp:  17  Temp:     General:  Obese, Well  appearing. No respiratory difficulty HEENT: normal Neck: supple. no JVD. Carotids 2+ bilat; no bruits. No lymphadenopathy or thryomegaly appreciated. Cor: PMI nondisplaced. Regular rate & rhythm. No rubs, gallops or murmurs. Lungs: clear Abdomen: soft, nontender, nondistended. No hepatosplenomegaly. No bruits or masses. Good bowel sounds. Extremities: no cyanosis, clubbing. B/L LE chronic venous stasis changes with 1+ b/l edema Neuro: alert & oriented x 3, cranial nerves grossly intact. moves all 4 extremities w/o difficulty. Affect pleasant.  ECG: NSR, inferior TWI and lateral nonspecific ST-T wave abn  Results for orders placed or performed during the hospital encounter of 06/01/17 (from the past 24 hour(s))  Basic metabolic panel     Status: Abnormal   Collection Time: 06/01/17  1:31 AM  Result Value Ref Range   Sodium 136 135 - 145 mmol/L   Potassium 4.1 3.5 - 5.1 mmol/L   Chloride 100 (L) 101 - 111 mmol/L   CO2 29 22 - 32 mmol/L   Glucose, Bld 211 (H) 65 - 99 mg/dL   BUN 24 (H) 6 - 20 mg/dL   Creatinine, Ser 1.13 0.61 - 1.24 mg/dL   Calcium 8.6 (L) 8.9 - 10.3 mg/dL   GFR calc non Af Amer >60 >60 mL/min   GFR calc Af Amer >60 >60 mL/min   Anion gap 7 5 - 15  CBC     Status: Abnormal   Collection Time: 06/01/17  1:31 AM  Result Value Ref Range   WBC 5.5 4.0 - 10.5 K/uL   RBC 3.33 (L) 4.22 - 5.81 MIL/uL   Hemoglobin 9.9 (L) 13.0 - 17.0 g/dL   HCT 31.2 (L) 39.0 - 52.0 %   MCV 93.7 78.0 - 100.0 fL   MCH 29.7 26.0 - 34.0 pg   MCHC 31.7 30.0 - 36.0 g/dL   RDW 16.1 (H) 11.5 - 15.5 %   Platelets 260 150 - 400 K/uL  I-stat troponin, ED     Status: Abnormal   Collection Time: 06/01/17  1:41 AM  Result Value Ref Range   Troponin i, poc 1.05 (HH) 0.00 - 0.08 ng/mL   Comment NOTIFIED PHYSICIAN    Comment 3           Dg Chest 2 View  Result Date: 06/01/2017 CLINICAL DATA:  Acute onset of generalized chest pain and shortness of breath. Initial encounter. EXAM: CHEST  2 VIEW  COMPARISON:  Chest radiograph performed 01/10/2017 FINDINGS: The lungs are well-aerated. Minimal bibasilar atelectasis is noted. There is no evidence of pleural effusion or pneumothorax. The heart is borderline normal in size. No acute osseous abnormalities are seen. IMPRESSION: Minimal bibasilar atelectasis noted.  Lungs otherwise clear. Electronically Signed   By: Garald Balding M.D.   On: 06/01/2017 01:55   ASSESSMENT: 76 yo male with a pmhx of CAD, HTN, CVA, and pAF who presents for evaluation of chest pain due to an NSTEMI.  He is currently chest pain free after having taken 4 SL NTG and 324 ASA at home.  He feels well  and SBPs were in the 90s during my exam, though he is otherwise hemodynamically stable.  EKG shows new ST-T wave changes in the inferior and lateral leads, however there is no evidence of ST elevation.   He was admitted for similar symptoms in the fall of last year, at which time LHC showed evidence of complex RCA stenosis that was not amenable to PCI.  As he has now failed medical therapy, may need to consider CT surgery consult for CABG.    PLAN/DISCUSSION: 1. NSTEMI -Admit to SDU with telemetry monitoring -Trend troponin, serial EKG -Pt already received 324 of ASA, will start 81mg  daily -Pt on plavix chronically, last dose this evening (he takes this medication at night).  Will not restart this for now, in the event he requires CABG.   -Start heparin gtt -NPO now.  Consider CT surgery consult +/- repeat LHC in AM.  -High intensity statin therapy with lipitor 80mg  daily  -PRN SL NTG and IV morphine for chest pain -TTE in AM -TSH, HbA1c, lipids  2. HTN -BP borderline at the time of my exam, will hold antihypertensives except for metoprolol 25mg  BID  3. COPD -Continue home inhalers  4. DM2 -Hold PO meds, cover with ISS and accuchecks  5. Chronic diastolic heart failure -Has some LE edema, but unclear if this is related to his heart failure as his volume status is  somewhat difficult for me to assess due to his body habitus -Continue PTA lasix for now  Regino Bellow, DO 3:22 AM

## 2017-06-01 NOTE — ED Triage Notes (Signed)
Per EMS, pt from home with c/o chest pain/pressure beginning at 2300 last night. Pain radiates to the left neck and arm. Pt took a total of 4 NTG prior to EMS arrival, pressure went from 8/10 to 1/10. Pt also had 324 aspirin PTA. EMS vitals: BP-110/60, HR-90, SpO2-95% 2L

## 2017-06-01 NOTE — ED Provider Notes (Signed)
[facility] DEPT Provider Note   CSN: [id number] Arrival date & time: 06/01/17  0110  By signing my name below, I, Margit Banda, attest that this documentation has been prepared under the direction and in the presence of Varney Biles, MD. Electronically Signed: Margit Banda, ED Scribe. 06/01/17. 2:18 AM.  History   Chief Complaint Chief Complaint  Patient presents with  . Chest Pain    HPI Charles [name] is a 76 y.o. male with a PMHx of CAD, CHF, CVA, tunnel vision, and HOH, who presents to the Emergency Department complaining of chest pressure that began ~ 11:30 pm on 05/31/17. Pain radiates to his left arm and neck. He took a total of 4 NTG prior to EMS arrival and his pressure went from 8/10 to 1/10. He also took 324 aspirin PTA. In the fall of 2017, pt had a blockage, however, he was not a candidate for a stent. Pt was medically treating his sx. Pt denies SOB and nausea.  Additionally, he c/o right leg swelling and pain.   PCP: Plotnikov, Evie Lacks, MD  The history is provided by the patient. No language interpreter was used.    Past Medical History:  Diagnosis Date  . Arthritis   . B12 deficiency   . CAD (coronary artery disease)    a. Approx. 2000 - MI. Cath: single vsl dz, PTCA dLAD/medical rx. ;  b. NSTEMI 11/13 => IVUS attempted for RCA but not successful; anatomy felt stable from 2000 => med Rx. c. [place] 08/2016: severe stable diffuse dLAD, severe prox RCA -> PCI of RCA unsuccessful, consider CABG for refractory angina.  . Cancer ([place])    skin cancer- head   . Cataracts   . Cholelithiasis   . Chronic diastolic CHF (congestive heart failure) ([place])   . Claustrophobia   . CVA (cerebral vascular accident) ([place]) ~ 2001   vision inparted from stroke.  Visual Memory loss  . Diabetic neuropathy ([place])   . Diverticulitis   . Dysrhythmia    if he does not take Metoprolol- Afib  . ED (erectile dysfunction)   . Essential hypertension   . History of MRSA infection    Right foot  . History of stomach ulcers   . HOH (hard of hearing)   . Hypercholesterolemia   . Legally blind   . Lower extremity edema   . Morbid obesity ([place])   . Neuropathy   . OSA on CPAP   . PAF (paroxysmal atrial fibrillation) ([name])    a. confirmed by event monitor. b. 02/2014 rash on Coumadin, patient decided to discontinue Xarelto due to possible rash, cost and lawyers ads on TV, agreed to take Plavix.  . Pneumonia 2016  . Tunnel vision    Since stroke  . Type II diabetes mellitus ([place])   . Venous stasis   . Weakness 01/11/2017    Patient Active Problem List   Diagnosis Date Noted  . NSTEMI (non-ST elevated myocardial infarction) ([place]) 06/01/2017  . Scaly skin 05/16/2017  . C. difficile colitis 01/31/2017  . PAT (paroxysmal atrial tachycardia) ([place]) 01/15/2017  . HLD (hyperlipidemia) 01/15/2017  . Hypoglycemia 01/11/2017  . Demand ischemia of myocardium ([place])   . Diarrhea 01/10/2017  . Elevated troponin 01/10/2017  . Dehydration 01/10/2017  . Hypoglycemia due to type 2 diabetes mellitus ([place]) 01/10/2017  . Hypoglycemia associated with diabetes ([place]) 10/17/2016  . Chronic ulcer of left foot ([place]) 08/31/2016  . Acute on chronic diastolic congestive heart failure ([place]) 08/20/2016  .  Venous stasis   . Lower extremity edema   . Hypercholesterolemia   . Diabetic neuropathy ([place])   . Unstable angina ([place]) 08/15/2016  . 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 Pressure ulcer 12/27/2015  . Gangrene of toe ([place]) 12/26/2015  . Acute renal failure (ARF) ([place]) 12/26/2015  . Subconjunctival hemorrhage 10/21/2015  . Cellulitis in diabetic foot ([place])   . Cellulitis 05/06/2015  . COPD exacerbation ([place]) 10/10/2014  . Hematuria 08/12/2014  . Hypokalemia 08/12/2014  . Urinary retention 08/03/2014  . Hypotension 07/26/2014  . COPD  07/26/2014  . Rash and nonspecific skin eruption 02/13/2014  . PAF (paroxysmal atrial fibrillation) ([place]) 11/13/2013  . Morbid obesity ([place])  05/24/2012  . Type 2 diabetes, uncontrolled, with retinopathy ([place]) 02/09/2012  . NEOPLASM OF UNCERTAIN BEHAVIOR OF SKIN 12/24/2010  . WEIGHT GAIN, ABNORMAL 02/02/2010  . TOBACCO USE, QUIT 02/02/2010  . 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 HYPOGONADISM 07/12/2007  . B12 deficiency 07/12/2007  . Essential hypertension 07/12/2007  . MYOCARDIAL INFARCTION, HX OF 07/12/2007  . CAD (coronary artery disease) 07/12/2007  . History of CVA (cerebrovascular accident) 07/12/2007  . VENOUS INSUFFICIENCY, LEGS 07/12/2007  . SYMPTOM, MEMORY LOSS 07/12/2007  . DIVERTICULITIS, HX OF 07/12/2007    Past Surgical History:  Procedure Laterality Date  . CARDIAC CATHETERIZATION  1990's  . CARDIAC CATHETERIZATION N/A 08/17/2016   Procedure: Left Heart Cath and Coronary Angiography;  Surgeon: Peter M Martinique, MD;  Location: [facility] CV LAB;  Service: Cardiovascular;  Laterality: N/A;  . CARDIAC CATHETERIZATION N/A 08/19/2016   Procedure: Coronary Balloon Angioplasty;  Surgeon: Peter M Martinique, MD;  Location: [facility] CV LAB;  Service: Cardiovascular;  Laterality: N/A;  . CATARACT EXTRACTION  10/2016  . CEREBRAL ANGIOGRAM  ~ 2000  . COLONOSCOPY    . LEFT HEART CATHETERIZATION WITH CORONARY ANGIOGRAM N/A 10/27/2012   Procedure: LEFT HEART CATHETERIZATION WITH CORONARY ANGIOGRAM;  Surgeon: Burnell Blanks, MD;  Location: [facility] CATH LAB;  Service: Cardiovascular;  Laterality: N/A;  . TOE AMPUTATION  2006; 2009   "Dr. Blenda Mounts; big toe left foot; little toe on right foot" (10/26/2012)  . TOE AMPUTATION Right 12/24/2015   2nd & 3 toes/notes 1/13//2017  . TOE AMPUTATION Right    5TH TOE  . TRANSMETATARSAL AMPUTATION Right 10/13/2016   Procedure: TRANSMETATARSAL AMPUTATION;  Surgeon: Trula Slade, DPM;  Location: [facility];  Service: Podiatry;  Laterality: Right;       Home Medications    Prior to Admission medications   Medication Sig Start Date End Date Taking? Authorizing  Provider  acetaminophen (TYLENOL) 500 MG tablet Take 1,000 mg by mouth at bedtime.    Yes [provider]  aspirin EC 81 MG tablet Take 81 mg by mouth daily.    Yes [provider]  cholecalciferol (VITAMIN D) 1000 units tablet Take 1,000 Units by mouth daily.    Yes [provider]  clindamycin (CLEOCIN) 300 MG capsule Take 300 mg by mouth 3 (three) times daily. For 10 days 05/23/17  Yes [provider]  clopidogrel (PLAVIX) 75 MG tablet Take 1 tablet (75 mg total) by mouth daily. 05/16/17  Yes Plotnikov, Evie Lacks, MD  clotrimazole-betamethasone (LOTRISONE) cream Apply 1 application topically 2 (two) times daily. 03/10/17 03/10/18 Yes Plotnikov, Evie Lacks, MD  collagenase (SANTYL) ointment Apply 1 application topically daily. 11/29/16  Yes Trula Slade, DPM  Fluticasone-Salmeterol (ADVAIR DISKUS) 100-50 MCG/DOSE AEPB Inhale 1 puff into the lungs 2 (two)  times daily. 03/02/16  Yes Plotnikov, Evie Lacks, MD  furosemide (LASIX) 80 MG tablet Take 1/2 tablet daily alternating with whole tablet daily Patient taking differently: Take 40-80 mg by mouth See admin instructions. Take 1/2 tablet daily alternating with whole tablet daily 05/16/17  Yes Plotnikov, Evie Lacks, MD  gabapentin (NEURONTIN) 300 MG capsule Take 1 capsule (300 mg total) by mouth 3 (three) times daily. 05/16/17  Yes Plotnikov, Evie Lacks, MD  ibuprofen (ADVIL,MOTRIN) 800 MG tablet Take 800 mg by mouth daily as needed for mild pain or moderate pain.   Yes [provider]  insulin aspart (NOVOLOG) 100 UNIT/ML FlexPen Inject 30 Units into the skin 4 (four) times daily - after meals and at bedtime. Follow SS 05/16/17  Yes Plotnikov, Evie Lacks, MD  isosorbide mononitrate (IMDUR) 60 MG 24 hr tablet Take 1 tablet (60 mg total) by mouth daily. 05/16/17  Yes Plotnikov, Evie Lacks, MD  losartan (COZAAR) 100 MG tablet Take 1 tablet (100 mg total) by mouth daily. 05/16/17  Yes Plotnikov, Evie Lacks, MD  metFORMIN  (GLUCOPHAGE) 500 MG tablet Take 1 tablet (500 mg total) by mouth 2 (two) times daily with a meal. 05/16/17  Yes Plotnikov, Evie Lacks, MD  metoprolol tartrate (LOPRESSOR) 25 MG tablet Take 1 tablet (25 mg total) by mouth 2 (two) times daily. 05/16/17  Yes Plotnikov, Evie Lacks, MD  nitroGLYCERIN (NITROSTAT) 0.4 MG SL tablet Place 1 tablet (0.4 mg total) under the tongue every 5 (five) minutes as needed (up to 3 doses). For chest pain 05/16/17  Yes Plotnikov, Evie Lacks, MD  pioglitazone (ACTOS) 45 MG tablet Take 1 tablet (45 mg total) by mouth daily. 05/16/17  Yes Plotnikov, Evie Lacks, MD  potassium chloride SA (K-DUR,KLOR-CON) 20 MEQ tablet Take 1 tablet (20 mEq total) by mouth daily. 05/16/17  Yes Plotnikov, Evie Lacks, MD  pravastatin (PRAVACHOL) 40 MG tablet Take 1 tablet (40 mg total) by mouth daily. 05/16/17  Yes Plotnikov, Evie Lacks, MD  triamcinolone cream (KENALOG) 0.5 % Apply 1 application topically 2 (two) times daily as needed (rash). 04/18/15  Yes Plotnikov, Evie Lacks, MD  vitamin B-12 (CYANOCOBALAMIN) 1000 MCG tablet Take 1,000 mcg by mouth daily.    Yes [provider]    Family History Family History  Problem Relation Age of Onset  . Breast cancer Mother 75  . Heart disease Father 55  . Asthma Neg Hx     Social History Social History  Substance Use Topics  . Smoking status: Former Smoker    Packs/day: 1.00    Years: 30.00    Types: Cigarettes, Cigars  . Smokeless tobacco: Former Systems developer    Quit date: 08/15/2016     Comment: 12/2016 quit smoking in 1998  . Alcohol use Yes     Comment: 12/2016   rare      Allergies   Xarelto [rivaroxaban]   Review of Systems Review of Systems  Respiratory: Negative for shortness of breath.   Cardiovascular: Positive for chest pain.  Gastrointestinal: Negative for nausea.  All other systems reviewed and are negative.    Physical Exam Updated Vital Signs BP (!) 114/46 (BP Location: Left Arm)   Pulse 74   Temp 98.1 F (36.7 C) (Axillary)    Resp (!) 21   Ht 6\' 4"  (1.93 m)   Wt (!) 166.2 kg (366 lb 6.4 oz)   SpO2 91%   BMI 44.60 kg/m   Physical Exam  Constitutional: He is oriented to person, place,  and time. He appears well-developed and well-nourished.  HENT:  Head: Normocephalic.  Eyes: EOM are normal.  Neck: Normal range of motion.  Cardiovascular: Normal rate, regular rhythm and normal heart sounds.   Pulmonary/Chest: Effort normal.  Lungs are clear to ascultation.   Abdominal: He exhibits no distension.  Musculoskeletal: Normal range of motion.  Neurological: He is alert and oriented to person, place, and time.  Psychiatric: He has a normal mood and affect.  Nursing note and vitals reviewed.    ED Treatments / Results  DIAGNOSTIC STUDIES: Oxygen Saturation is 99% on RA, normal by my interpretation.   COORDINATION OF CARE: 2:18 AM-Discussed next steps with pt which includes starting heparin and being admitted. Pt verbalized understanding and is agreeable with the plan.   Labs (all labs ordered are listed, but only abnormal results are displayed) Labs Reviewed  BASIC METABOLIC PANEL - Abnormal; Notable for the following:       Result Value   Chloride 100 (*)    Glucose, Bld 211 (*)    BUN 24 (*)    Calcium 8.6 (*)    All other components within normal limits  CBC - Abnormal; Notable for the following:    RBC 3.33 (*)    Hemoglobin 9.9 (*)    HCT 31.2 (*)    RDW 16.1 (*)    All other components within normal limits  TROPONIN I - Abnormal; Notable for the following:    Troponin I 1.44 (*)    All other components within normal limits  BASIC METABOLIC PANEL - Abnormal; Notable for the following:    BUN 24 (*)    Calcium 8.7 (*)    All other components within normal limits  CBC - Abnormal; Notable for the following:    RBC 3.50 (*)    Hemoglobin 10.2 (*)    HCT 33.1 (*)    RDW 16.3 (*)    All other components within normal limits  I-STAT TROPOININ, ED - Abnormal; Notable for the following:      Troponin i, poc 1.05 (*)    All other components within normal limits  MRSA PCR SCREENING  TSH  LIPID PANEL  GLUCOSE, CAPILLARY  HEPARIN LEVEL (UNFRACTIONATED)  TROPONIN I  TROPONIN I  HEMOGLOBIN A1C    EKG  EKG Interpretation  Date/Time:  Wednesday June 01 2017 01:11:53 EDT Ventricular Rate:  90 PR Interval:    QRS Duration: 86 QT Interval:  378 QTC Calculation: 462 R Axis:   49 Text Interpretation:  Accelerated Junctional rhythm Low voltage QRS Possible Inferior infarct , age undetermined Abnormal ECG No acute changes inferior leads have new TWI Nonspecific ST and T wave abnormality Confirmed by Varney Biles [phone]) on 06/01/2017 1:51:47 AM       Radiology Dg Chest 2 View  Result Date: 06/01/2017 CLINICAL DATA:  Acute onset of generalized chest pain and shortness of breath. Initial encounter. EXAM: CHEST  2 VIEW COMPARISON:  Chest radiograph performed 01/10/2017 FINDINGS: The lungs are well-aerated. Minimal bibasilar atelectasis is noted. There is no evidence of pleural effusion or pneumothorax. The heart is borderline normal in size. No acute osseous abnormalities are seen. IMPRESSION: Minimal bibasilar atelectasis noted.  Lungs otherwise clear. Electronically Signed   By: Garald Balding M.D.   On: 06/01/2017 01:55    Procedures Procedures (including critical care time)  CRITICAL CARE Performed by: Varney Biles   Total critical care time: 33 minutes for elevated troponin   Critical care time was exclusive  of separately billable procedures and treating other patients.  Critical care was necessary to treat or prevent imminent or life-threatening deterioration.  Critical care was time spent personally by me on the following activities: development of treatment plan with patient and/or surrogate as well as nursing, discussions with consultants, evaluation of patient's response to treatment, examination of patient, obtaining history from patient or surrogate,  ordering and performing treatments and interventions, ordering and review of laboratory studies, ordering and review of radiographic studies, pulse oximetry and re-evaluation of patient's condition.   Medications Ordered in ED Medications  heparin ADULT infusion 100 units/mL (25000 units/293mL sodium chloride 0.45%) (1,750 Units/hr Intravenous New Bag/Given 06/01/17 0320)  nitroGLYCERIN (NITROSTAT) SL tablet 0.4 mg (not administered)  acetaminophen (TYLENOL) tablet 650 mg (not administered)  ondansetron (ZOFRAN) injection 4 mg (not administered)  morphine 2 MG/ML injection 2 mg (not administered)  furosemide (LASIX) tablet 40 mg (not administered)  gabapentin (NEURONTIN) capsule 300 mg (not administered)  insulin aspart (novoLOG) injection 15 Units (15 Units Subcutaneous Not Given 06/01/17 0801)  metoprolol tartrate (LOPRESSOR) tablet 25 mg (not administered)  potassium chloride SA (K-DUR,KLOR-CON) CR tablet 20 mEq (not administered)  cholecalciferol (VITAMIN D) tablet 1,000 Units (not administered)  aspirin EC tablet 81 mg (not administered)  mometasone-formoterol (DULERA) 100-5 MCG/ACT inhaler 2 puff (2 puffs Inhalation Not Given 06/01/17 0948)  atorvastatin (LIPITOR) tablet 80 mg (not administered)  insulin aspart (novoLOG) injection 0-5 Units (not administered)  insulin aspart (novoLOG) injection 0-15 Units (not administered)  clopidogrel (PLAVIX) tablet 75 mg (75 mg Oral Given 06/01/17 0511)  heparin bolus via infusion 4,000 Units (4,000 Units Intravenous Bolus from Bag 06/01/17 0320)     Initial Impression / Assessment and Plan / ED Course  I have reviewed the triage vital signs and the nursing notes.  Pertinent labs & imaging results that were available during my care of the patient were reviewed by me and considered in my medical decision making (see chart for details).     Based on hx and exam - pt is having NSTEMI. He has new TWI in the inferior leads as well. We will admit to  Cards.   Final Clinical Impressions(s) / ED Diagnoses   Final diagnoses:  NSTEMI (non-ST elevated myocardial infarction) [facility])    New Prescriptions Current Discharge Medication List     I personally performed the services described in this documentation, which was scribed in my presence. The recorded information has been reviewed and is accurate.    Varney Biles, MD 06/01/17 [phone]

## 2017-06-01 NOTE — Progress Notes (Signed)
ANTICOAGULATION CONSULT NOTE - Initial Consult  Pharmacy Consult for heparin Indication: chest pain/ACS  Allergies  Allergen Reactions  . Xarelto [Rivaroxaban] Rash    Patient Measurements: Height: 6\' 4"  (193 cm) Weight: (!) 370 lb (167.8 kg) IBW/kg (Calculated) : 86.8 Heparin Dosing Weight: 125kg  Vital Signs: Temp: 98.3 F (36.8 C) (06/20 0118) Temp Source: Oral (06/20 0118) BP: 94/54 (06/20 0215) Pulse Rate: 81 (06/20 0215)  Labs:  Recent Labs  06/01/17 0131  HGB 9.9*  HCT 31.2*  PLT 260  CREATININE 1.13    Estimated Creatinine Clearance: 95.2 mL/min (by C-G formula based on SCr of 1.13 mg/dL).   Medical History: Past Medical History:  Diagnosis Date  . Arthritis   . B12 deficiency   . CAD (coronary artery disease)    a. Approx. 2000 - MI. Cath: single vsl dz, PTCA dLAD/medical rx. ;  b. NSTEMI 11/13 => IVUS attempted for RCA but not successful; anatomy felt stable from 2000 => med Rx. c. [place] 08/2016: severe stable diffuse dLAD, severe prox RCA -> PCI of RCA unsuccessful, consider CABG for refractory angina.  . Cancer ([place])    skin cancer- head   . Cataracts   . Cholelithiasis   . Chronic diastolic CHF (congestive heart failure) ([place])   . Claustrophobia   . CVA (cerebral vascular accident) ([place]) ~ 2001   vision inparted from stroke.  Visual Memory loss  . Diabetic neuropathy ([place])   . Diverticulitis   . Dysrhythmia    if he does not take Metoprolol- Afib  . ED (erectile dysfunction)   . Essential hypertension   . History of MRSA infection    Right foot  . History of stomach ulcers   . HOH (hard of hearing)   . Hypercholesterolemia   . Legally blind   . Lower extremity edema   . Morbid obesity ([place])   . Neuropathy   . OSA on CPAP   . PAF (paroxysmal atrial fibrillation) ([name])    a. confirmed by event monitor. b. 02/2014 rash on Coumadin, patient decided to discontinue Xarelto due to possible rash, cost and lawyers ads on TV, agreed to take Plavix.   . Pneumonia 2016  . Tunnel vision    Since stroke  . Type II diabetes mellitus ([place])   . Venous stasis   . Weakness 01/11/2017    Assessment: 76yo male c/o CP/pressure radiating to neck and arm, mostly relieved w/ NTG prior to EMS arrival, istat troponin elevated, to begin heparin; of note pt has been on Coumadin and Xarelto in the past for Afib but had rash w/ both and now just on Plavix.  Goal of Therapy:  Heparin level 0.3-0.7 units/ml Monitor platelets by anticoagulation protocol: Yes   Plan:  Will give heparin 4000 units x1 followed by gtt at 1750 units/hr and monitor heparin levels and CBC.  Wynona Neat, PharmD, BCPS  06/01/2017,2:45 AM

## 2017-06-01 NOTE — Progress Notes (Signed)
Progress Note  Patient Name: Charles [name] Date of Encounter: 06/01/2017    Subjective  No current CP. Right pretibial region hurts after fall about a week ago. Tender to touch. Able to walk on it however.   Inpatient Medications    Scheduled Meds: . aspirin EC  81 mg Oral Daily  . atorvastatin  80 mg Oral q1800  . cholecalciferol  1,000 Units Oral Daily  . furosemide  40 mg Oral Daily  . gabapentin  300 mg Oral TID  . insulin aspart  0-15 Units Subcutaneous TID WC  . insulin aspart  0-5 Units Subcutaneous QHS  . insulin aspart  15 Units Subcutaneous TID WC  . metoprolol tartrate  25 mg Oral BID  . mometasone-formoterol  2 puff Inhalation BID  . potassium chloride SA  20 mEq Oral Daily   Continuous Infusions: . heparin 1,750 Units/hr (06/01/17 0320)   PRN Meds: acetaminophen, morphine injection, nitroGLYCERIN, ondansetron (ZOFRAN) IV   Vital Signs    Vitals:   06/01/17 0530 06/01/17 0534 06/01/17 0744 06/01/17 0815  BP:  (!) 114/46    Pulse:  77 74   Resp:  18 (!) 21   Temp:  97.7 F (36.5 C)  98.1 F (36.7 C)  TempSrc:  Oral  Axillary  SpO2:  98% 91%   Weight: (!) 366 lb 6.4 oz (166.2 kg)     Height: 6\' 4"  (1.93 m)      No intake or output data in the 24 hours ending 06/01/17 1108 Filed Weights   06/01/17 0118 06/01/17 0530  Weight: (!) 370 lb (167.8 kg) (!) 366 lb 6.4 oz (166.2 kg)    Telemetry    Sinus rhythm, PACs - Personally Reviewed  ECG    Sinus rhythm with nonspecific ST-T wave changes, T-wave inversions inferior - Personally Reviewed  Physical Exam   GEN: No acute distress.   Neck: No JVD Cardiac: RRR, no murmurs, rubs, or gallops.  Respiratory: Clear to auscultation bilaterally. GI: Soft, nontender, non-distended Obese MS: No edema; No deformity. Tender right pretibial region, minor ecchymosis at site of prior fall. Neuro:  Nonfocal  Psych: Normal affect   Labs    Chemistry Recent Labs Lab 06/01/17 0131 06/01/17 0646  NA 136  138  K 4.1 3.9  CL 100* 101  CO2 29 30  GLUCOSE 211* 96  BUN 24* 24*  CREATININE 1.13 1.07  CALCIUM 8.6* 8.7*  GFRNONAA >60 >60  GFRAA >60 >60  ANIONGAP 7 7     Hematology Recent Labs Lab 06/01/17 0131 06/01/17 0646  WBC 5.5 5.7  RBC 3.33* 3.50*  HGB 9.9* 10.2*  HCT 31.2* 33.1*  MCV 93.7 94.6  MCH 29.7 29.1  MCHC 31.7 30.8  RDW 16.1* 16.3*  PLT 260 285    Cardiac Enzymes Recent Labs Lab 06/01/17 0646  TROPONINI 1.44*    Recent Labs Lab 06/01/17 0141  TROPIPOC 1.05*     BNPNo results for input(s): BNP, PROBNP in the last 168 hours.   DDimer No results for input(s): DDIMER in the last 168 hours.   Radiology    Dg Chest 2 View  Result Date: 06/01/2017 CLINICAL DATA:  Acute onset of generalized chest pain and shortness of breath. Initial encounter. EXAM: CHEST  2 VIEW COMPARISON:  Chest radiograph performed 01/10/2017 FINDINGS: The lungs are well-aerated. Minimal bibasilar atelectasis is noted. There is no evidence of pleural effusion or pneumothorax. The heart is borderline normal in size. No acute osseous abnormalities  are seen. IMPRESSION: Minimal bibasilar atelectasis noted.  Lungs otherwise clear. Electronically Signed   By: Garald Balding M.D.   On: 06/01/2017 01:55    Cardiac Studies   Echocardiogram 01/12/17 - Left ventricle: The cavity size was normal. Wall thickness was   increased in a pattern of mild LVH. Systolic function was normal.   The estimated ejection fraction was in the range of 60% to 65%.   Wall motion was normal; there were no regional wall motion   abnormalities. Features are consistent with a pseudonormal left   ventricular filling pattern, with concomitant abnormal relaxation   and increased filling pressure (grade 2 diastolic dysfunction). - Aortic valve: Moderately calcified annulus. - Mitral valve: Mildly calcified annulus. - Left atrium: The atrium was mildly dilated. - Right atrium: The atrium was mildly dilated. - Pulmonary  arteries: Systolic pressure was mildly increased. PA   peak pressure: 35 mm Hg (S).  Impressions:  - The echo is of very poor quality - technically difficult.   The valves are not seen well at all.  Cardiac catheterization 9/5-06/2016    Prox RCA lesion, 90 %stenosed.  Post intervention, there is a 90% residual stenosis.   1. Unsuccessful PCI of the proximal RCA due to inability to cross with stent or adequate sized balloon. 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  Plan: aggressive medical therapy. If patient has refractory limiting angina would need to consider for CABG.   [place]  Diagnostic Diagram       Post-Intervention Diagram          Patient Profile     76 y.o. male with non-ST elevation myocardial infarction  Assessment & Plan    Non-ST elevation myocardial infarction  - IV heparin, beta blocker as tolerated, nitroglycerin as needed, statin, holding Plavix  - Proceeding with cardiac catheterization. Risks and benefits explained including stroke heart attack death, renal impairment, bleeding.  - Prior cardiac catheterization reviewed from September 2017, Dr. Judee Clara to stent right coronary artery successfully, inability to cross lesion with stent, shepherd's crook.  - I think it is prudent to reassess his coronary anatomy given his current non-STEMI and if necessary, consult surgery for right of refusal for bypass. He has not had an official consultation from them.   - Pain described as squeezing chest pain consistent with prior anginal symptoms. T-wave inversions noted inferior leads.  Essential hypertension  - Currently hypotensive, holding antihypertensives except for metoprolol.  COPD  - Home inhalers  Diabetes with hypertension  - Holding by mouth meds. Insulin sliding scale.  Chronic diastolic heart failure  - Related to his morbid obesity  - Home dose  Lasix.  Morbid obesity  - Encourage weight loss.  Signed, Candee Furbish, MD  06/01/2017, 11:08 AM

## 2017-06-01 NOTE — ED Notes (Signed)
Left hand IV removed by pt.

## 2017-06-01 NOTE — Progress Notes (Signed)
Critical Result - Troponin = 1.44. Reported to Delos Haring, PA via text messaging.

## 2017-06-01 NOTE — ED Notes (Signed)
Patient transported to X-ray 

## 2017-06-01 NOTE — Progress Notes (Signed)
[facility] for heparin Indication: chest pain/ACS  Allergies  Allergen Reactions  . Xarelto [Rivaroxaban] Rash    Patient Measurements: Height: 6\' 4"  (193 cm) Weight: (!) 366 lb 6.4 oz (166.2 kg) IBW/kg (Calculated) : 86.8 Heparin Dosing Weight: 125kg  Vital Signs: Temp: 98.1 F (36.7 C) (06/20 0815) Temp Source: Axillary (06/20 0815) BP: 114/46 (06/20 0534) Pulse Rate: 74 (06/20 0744)  Labs:  Recent Labs  06/01/17 0131 06/01/17 0646 06/01/17 1151  HGB 9.9* 10.2*  --   HCT 31.2* 33.1*  --   PLT 260 285  --   LABPROT  --   --  13.9  INR  --   --  1.06  HEPARINUNFRC  --   --  0.74*  CREATININE 1.13 1.07  --   TROPONINI  --  1.44*  --     Estimated Creatinine Clearance: 100.1 mL/min (by C-G formula based on SCr of 1.07 mg/dL).  Assessment: 76yo male on heparin infusion for ACS workup. Intimal heparin level is slightly higher than desired at 0.74. CBC stable.   Goal of Therapy:  Heparin level 0.3-0.7 units/ml Monitor platelets by anticoagulation protocol: Yes   Plan:  1. Decrease heparin infusion to 1650 units/hr  2. Repeat heparin level in 8 hours if comes before planned cath   Vincenza Hews, PharmD, BCPS 06/01/2017, 1:10 PM

## 2017-06-01 NOTE — Interval H&P Note (Signed)
History and Physical Interval Note:  06/01/2017 4:17 PM  Charles [name]  has presented today for cardiac cath with the diagnosis of CAD/NSTEMI. The various methods of treatment have been discussed with the patient and family. After consideration of risks, benefits and other options for treatment, the patient has consented to  Procedure(s): Left Heart Cath and Coronary Angiography (N/A) as a surgical intervention .  The patient's history has been reviewed, patient examined, no change in status, stable for surgery.  I have reviewed the patient's chart and labs.  Questions were answered to the patient's satisfaction.    Cath Lab Visit (complete for each Cath Lab visit)  Clinical Evaluation Leading to the Procedure:   ACS: Yes.    Non-ACS:    Anginal Classification: CCS III  Anti-ischemic medical therapy: Maximal Therapy (2 or more classes of medications)  Non-Invasive Test Results: No non-invasive testing performed  Prior CABG: No previous CABG        Lauree Chandler

## 2017-06-02 ENCOUNTER — Encounter (HOSPITAL_COMMUNITY): Payer: Self-pay | Admitting: Cardiovascular Disease

## 2017-06-02 ENCOUNTER — Inpatient Hospital Stay (HOSPITAL_COMMUNITY): Payer: Medicare Other

## 2017-06-02 ENCOUNTER — Ambulatory Visit: Payer: Medicare Other | Admitting: Podiatry

## 2017-06-02 DIAGNOSIS — I360 Nonrheumatic tricuspid (valve) stenosis: Secondary | ICD-10-CM

## 2017-06-02 DIAGNOSIS — I214 Non-ST elevation (NSTEMI) myocardial infarction: Secondary | ICD-10-CM

## 2017-06-02 DIAGNOSIS — I25110 Atherosclerotic heart disease of native coronary artery with unstable angina pectoris: Secondary | ICD-10-CM

## 2017-06-02 LAB — GLUCOSE, CAPILLARY
GLUCOSE-CAPILLARY: 133 mg/dL — AB (ref 65–99)
GLUCOSE-CAPILLARY: 104 mg/dL — AB (ref 65–99)
Glucose-Capillary: 57 mg/dL — ABNORMAL LOW (ref 65–99)
Glucose-Capillary: 83 mg/dL (ref 65–99)
Glucose-Capillary: 133 mg/dL — ABNORMAL HIGH (ref 65–99)
Glucose-Capillary: 198 mg/dL — ABNORMAL HIGH (ref 65–99)

## 2017-06-02 LAB — ECHOCARDIOGRAM COMPLETE
HEIGHTINCHES: 76 in
WEIGHTICAEL: 5862.4 [oz_av]

## 2017-06-02 LAB — BASIC METABOLIC PANEL
ANION GAP: 8 (ref 5–15)
BUN: 15 mg/dL (ref 6–20)
CALCIUM: 8.3 mg/dL — AB (ref 8.9–10.3)
CHLORIDE: 102 mmol/L (ref 101–111)
CO2: 26 mmol/L (ref 22–32)
CREATININE: 0.77 mg/dL (ref 0.61–1.24)
GFR calc Af Amer: >60 mL/min (ref >60)
GLUCOSE: 148 mg/dL — AB (ref 65–99)
POTASSIUM: 4 mmol/L (ref 3.5–5.1)
SODIUM: 136 mmol/L (ref 135–145)

## 2017-06-02 LAB — CBC
HCT: 31.2 % — ABNORMAL LOW (ref 39.0–52.0)
HEMOGLOBIN: 9.7 g/dL — AB (ref 13.0–17.0)
MCH: 29.7 pg (ref 26.0–34.0)
MCHC: 31.1 g/dL (ref 30.0–36.0)
MCV: 95.4 fL (ref 78.0–100.0)
PLATELETS: 260 10*3/uL (ref 150–400)
RBC: 3.27 MIL/uL — AB (ref 4.22–5.81)
RDW: 16.5 % — ABNORMAL HIGH (ref 11.5–15.5)
WBC: 5.2 10*3/uL (ref 4.0–10.5)

## 2017-06-02 LAB — HEMOGLOBIN A1C
HEMOGLOBIN A1C: 7.2 % — AB (ref 4.8–5.6)
Mean Plasma Glucose: 160 mg/dL

## 2017-06-02 LAB — HEPARIN LEVEL (UNFRACTIONATED): Heparin Unfractionated: 0.25 IU/mL — ABNORMAL LOW (ref 0.30–0.70)

## 2017-06-02 MED ORDER — RANOLAZINE ER 500 MG PO TB12: 500.0000 mg | ORAL_TABLET | Freq: Two times a day (BID) | ORAL | 3 refills | Status: DC

## 2017-06-02 MED ORDER — ATORVASTATIN CALCIUM 80 MG PO TABS: 80.0000 mg | ORAL_TABLET | Freq: Every day | ORAL | 6 refills | Status: DC

## 2017-06-02 MED ORDER — ATORVASTATIN CALCIUM 80 MG PO TABS: 80.0000 mg | ORAL_TABLET | Freq: Every day | ORAL | 3 refills | Status: DC

## 2017-06-02 MED ORDER — RANOLAZINE ER 500 MG PO TB12: 500.0000 mg | ORAL_TABLET | Freq: Two times a day (BID) | ORAL | 6 refills | Status: DC

## 2017-06-02 MED ORDER — ISOSORBIDE MONONITRATE ER 60 MG PO TB24
60.0000 mg | ORAL_TABLET | Freq: Every day | ORAL | Status: DC
  Administered 2017-06-02: 60 mg via ORAL
  Filled 2017-06-02: qty 1

## 2017-06-02 MED ORDER — INSULIN ASPART 100 UNIT/ML ~~LOC~~ SOLN
5.0000 [IU] | Freq: Three times a day (TID) | SUBCUTANEOUS | Status: DC
Start: 2017-06-02 — End: 2017-06-02
  Administered 2017-06-02: 5 [IU] via SUBCUTANEOUS

## 2017-06-02 NOTE — Progress Notes (Signed)
  Echocardiogram 2D Echocardiogram has been performed.  Allan Bacigalupi T Shontel Santee 06/02/2017, 2:25 PM

## 2017-06-02 NOTE — Discharge Instructions (Signed)

## 2017-06-02 NOTE — Progress Notes (Signed)
    Discussed with Dr. Roxan Hockey. Appreciate his consultation. Not an optimal surgical candidate. Would be extremely high risk for prolonged debilitation. He and his wife understand.  We will begin Ranexa 500 mg twice a day. I did explain to him that occasionally this medication is very expensive. If it is, he states that he will not take it understandably.  He has not received losartan since his hospitalization. Blood pressures have been excellent. We will stop.  We will restart Plavix and aspirin.  Continue isosorbide 60.  No other changes made to his home medications.  He desires to go home this evening. I'm comfortable with this. He has had no adverse arrhythmias. Ejection fraction is normal on echocardiogram.  He will follow-up with Dr. Alain Marion. I also encouraged him to follow-up with Dr. Harrington Challenger his cardiologist.  Candee Furbish, MD

## 2017-06-02 NOTE — Plan of Care (Signed)
Problem: Safety: Goal: Ability to remain free from injury will improve Outcome: Progressing Fall risk bundle remains in place. No injuries, skin break down or falls this shift.

## 2017-06-02 NOTE — Progress Notes (Signed)
Progress Note  Patient Name: Charles [name] Date of Encounter: 06/02/2017    Subjective  No current CP. Cath yesterday, no change, see below. He and his wife are upset that no one before told him about the extent of his CAD prior to yesterday. I explained to them that it would be very unusual for the news not to be delivered after both cath's last year. They are frustrated. I tried to calm their frustrations.   Inpatient Medications    Scheduled Meds: . aspirin EC  81 mg Oral Daily  . atorvastatin  80 mg Oral q1800  . cholecalciferol  1,000 Units Oral Daily  . furosemide  40 mg Oral Daily  . gabapentin  300 mg Oral TID  . insulin aspart  0-15 Units Subcutaneous TID WC  . insulin aspart  0-5 Units Subcutaneous QHS  . insulin aspart  15 Units Subcutaneous TID WC  . isosorbide mononitrate  60 mg Oral Daily  . metoprolol tartrate  25 mg Oral BID  . mometasone-formoterol  2 puff Inhalation BID  . potassium chloride SA  20 mEq Oral Daily  . sodium chloride flush  3 mL Intravenous Q12H   Continuous Infusions: . sodium chloride 250 mL (06/02/17 0031)  . heparin 1,650 Units/hr (06/02/17 0031)   PRN Meds: sodium chloride, acetaminophen, morphine injection, nitroGLYCERIN, ondansetron (ZOFRAN) IV, sodium chloride flush   Vital Signs    Vitals:   06/01/17 2300 06/01/17 2313 06/02/17 0500 06/02/17 0600  BP: (!) 107/55   118/67  Pulse: 75 80 64   Resp: 20 16 18    Temp:      TempSrc:      SpO2: 95% 95% 98%   Weight:      Height:        Intake/Output Summary (Last 24 hours) at 06/02/17 1017 Last data filed at 06/02/17 0900  Gross per 24 hour  Intake            426.5 ml  Output             1450 ml  Net          -1023.5 ml   Filed Weights   06/01/17 0118 06/01/17 0530  Weight: (!) 370 lb (167.8 kg) (!) 366 lb 6.4 oz (166.2 kg)    Telemetry    Sinus rhythm, PACs - Personally Reviewed  ECG    Sinus rhythm with nonspecific ST-T wave changes, T-wave inversions inferior -  Personally Reviewed  Physical Exam   GEN: No acute distress.   Neck: No JVD Cardiac: RRR, no murmurs, rubs, or gallops.  Respiratory: Clear to auscultation bilaterally. GI: Soft, nontender, non-distended obese MS: No edema; No deformity. Tender right pretibial region, minor ecchymosis at site of prior fall. Neuro:  Nonfocal  Psych: Normal affect   Labs    Chemistry  Recent Labs Lab 06/01/17 0131 06/01/17 0646 06/02/17 0524  NA 136 138 136  K 4.1 3.9 4.0  CL 100* 101 102  CO2 29 30 26   GLUCOSE 211* 96 148*  BUN 24* 24* 15  CREATININE 1.13 1.07 0.77  CALCIUM 8.6* 8.7* 8.3*  GFRNONAA >60 >60 >60  GFRAA >60 >60 >60  ANIONGAP 7 7 8      Hematology  Recent Labs Lab 06/01/17 0131 06/01/17 0646 06/02/17 0524  WBC 5.5 5.7 5.2  RBC 3.33* 3.50* 3.27*  HGB 9.9* 10.2* 9.7*  HCT 31.2* 33.1* 31.2*  MCV 93.7 94.6 95.4  MCH 29.7 29.1 29.7  MCHC  31.7 30.8 31.1  RDW 16.1* 16.3* 16.5*  PLT 260 285 260    Cardiac Enzymes  Recent Labs Lab 06/01/17 0646 06/01/17 1151 06/01/17 1836  TROPONINI 1.44* 1.41* 1.15*     Recent Labs Lab 06/01/17 0141  TROPIPOC 1.05*     BNPNo results for input(s): BNP, PROBNP in the last 168 hours.   DDimer No results for input(s): DDIMER in the last 168 hours.   Radiology    Dg Chest 2 View  Result Date: 06/01/2017 CLINICAL DATA:  Acute onset of generalized chest pain and shortness of breath. Initial encounter. EXAM: CHEST  2 VIEW COMPARISON:  Chest radiograph performed 01/10/2017 FINDINGS: The lungs are well-aerated. Minimal bibasilar atelectasis is noted. There is no evidence of pleural effusion or pneumothorax. The heart is borderline normal in size. No acute osseous abnormalities are seen. IMPRESSION: Minimal bibasilar atelectasis noted.  Lungs otherwise clear. Electronically Signed   By: Garald Balding M.D.   On: 06/01/2017 01:55    Cardiac Studies   Echocardiogram 01/12/17 - Left ventricle: The cavity size was normal. Wall  thickness was   increased in a pattern of mild LVH. Systolic function was normal.   The estimated ejection fraction was in the range of 60% to 65%.   Wall motion was normal; there were no regional wall motion   abnormalities. Features are consistent with a pseudonormal left   ventricular filling pattern, with concomitant abnormal relaxation   and increased filling pressure (grade 2 diastolic dysfunction). - Aortic valve: Moderately calcified annulus. - Mitral valve: Mildly calcified annulus. - Left atrium: The atrium was mildly dilated. - Right atrium: The atrium was mildly dilated. - Pulmonary arteries: Systolic pressure was mildly increased. PA   peak pressure: 35 mm Hg (S).  Impressions:  - The echo is of very poor quality - technically difficult.   The valves are not seen well at all.  Cardiac catheterization 9/5-06/2016    Prox RCA lesion, 90 %stenosed.  Post intervention, there is a 90% residual stenosis.   1. Unsuccessful PCI of the proximal RCA due to inability to cross with stent or adequate sized balloon. 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  Plan: aggressive medical therapy. If patient has refractory limiting angina would need to consider for CABG.   [place]  Diagnostic Diagram       Post-Intervention Diagram          Patient Profile     76 y.o. male with non-ST elevation myocardial infarction  Assessment & Plan    Non-ST elevation myocardial infarction  - IV heparin will stop this afternoon, beta blocker as tolerated (BP), nitroglycerin as needed, statin, holding Plavix  - Prior cardiac catheterization reviewed from September 2017, Dr. Judee Clara to stent right coronary artery successfully, inability to cross lesion with stent, shepherd's crook. Current cath no change. Dr. Angelena Form explained to them.  -Consulted surgery for possible bypass. He has not had an official  consultation from them. Not an optimal candidate. Explained to him.   - Pain described as squeezing chest pain consistent with prior anginal symptoms. T-wave inversions noted inferior leads.  Essential hypertension  - Currently hypotensive, holding antihypertensives except for metoprolol.  - Continue to hold losartan.   COPD  - Home inhalers  Diabetes with hypertension  - Holding by mouth meds. Insulin sliding scale.  Chronic diastolic heart failure  - Related to his morbid obesity  - Home dose Lasix.  Morbid obesity  - Encourage weight loss.  - 370 pounds. Stated that he should try to lose 150 pounds. He is at increased mortality risk.   May be able to be DC'd later today.  Signed, Candee Furbish, MD  06/02/2017, 10:17 AM

## 2017-06-02 NOTE — Consult Note (Signed)
Reason for Consult: CAD with unstable angina Referring Physician: Dr. Angelena Form, Marlou Porch, Charles [name] is an 76 y.o. male.  HPI: 76 yo man with numerous medical issues including known CAD who presented with an unstable episode of angina. Ruled in for a non-STEMI. At cath has a tight complex proximal RCA stenosis unchanged from his last cath in September. The lesion was not amenable to PCI at that time. He also has a chronic lon segment 80% distal LAD stenosis.   He is disabled secondary to a stroke several years ago which impaired his peripheral vision and balance. 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 His activities are very limited. He is morbidly obese at 370 pounds and has severe venous stasis. He has COPD and OSA.  Past Medical History:  Diagnosis Date  . Arthritis   . B12 deficiency   . CAD (coronary artery disease)    a. Approx. 2000 - MI. Cath: single vsl dz, PTCA dLAD/medical rx. ;  b. NSTEMI 11/13 => IVUS attempted for RCA but not successful; anatomy felt stable from 2000 => med Rx. c. [place] 08/2016: severe stable diffuse dLAD, severe prox RCA -> PCI of RCA unsuccessful, consider CABG for refractory angina.  . Cancer ([place])    skin cancer- head   . Cataracts   . Cholelithiasis   . Chronic diastolic CHF (congestive heart failure) ([place])   . Claustrophobia   . COPD (chronic obstructive pulmonary disease) ([place])   . CVA (cerebral vascular accident) ([place]) ~ 2001   vision inparted from stroke.  Visual Memory loss  . Diabetic neuropathy ([place])   . Diverticulitis   . Dysrhythmia    if he does not take Metoprolol- Afib  . ED (erectile dysfunction)   . Essential hypertension   . History of MRSA infection    Right foot  . History of stomach ulcers   . HOH (hard of hearing)   . Hypercholesterolemia   . Legally blind   . Lower extremity edema   . Morbid obesity ([place])   . Neuropathy   . NSTEMI  (non-ST elevated myocardial infarction) ([place]) 05/2017  . OSA on CPAP   . PAF (paroxysmal atrial fibrillation) ([name])    a. confirmed by event monitor. b. 02/2014 rash on Coumadin, patient decided to discontinue Xarelto due to possible rash, cost and lawyers ads on TV, agreed to take Plavix.  . Pneumonia 2016  . Tunnel vision    Since stroke  . Type II diabetes mellitus ([place])   . Venous stasis   . Weakness 01/11/2017    Past Surgical History:  Procedure Laterality Date  . CARDIAC CATHETERIZATION  1990's  . CARDIAC CATHETERIZATION N/A 08/17/2016   Procedure: Left Heart Cath and Coronary Angiography;  Surgeon: Peter M Martinique, MD;  Location: [facility];  Service: Cardiovascular;  Laterality: N/A;  . CARDIAC CATHETERIZATION N/A 08/19/2016   Procedure: Coronary Balloon Angioplasty;  Surgeon: Peter M Martinique, MD;  Location: [facility];  Service: Cardiovascular;  Laterality: N/A;  . CATARACT EXTRACTION  10/2016  . CEREBRAL ANGIOGRAM  ~ 2000  . COLONOSCOPY    . LEFT HEART CATH AND CORONARY ANGIOGRAPHY N/A 06/01/2017   Procedure: Left Heart Cath and Coronary Angiography;  Surgeon: Burnell Blanks, MD;  Location: [facility];  Service: Cardiovascular;  Laterality: N/A;  . LEFT HEART CATHETERIZATION WITH CORONARY ANGIOGRAM N/A  10/27/2012   Procedure: LEFT HEART CATHETERIZATION WITH CORONARY ANGIOGRAM;  Surgeon: Burnell Blanks, MD;  Location: [facility] CATH [facility];  Service: Cardiovascular;  Laterality: N/A;  . TOE AMPUTATION  2006; 2009   "Dr. Blenda Mounts; big toe left foot; little toe on right foot" (10/26/2012)  . TOE AMPUTATION Right 12/24/2015   2nd & 3 toes/notes 1/13//2017  . TOE AMPUTATION Right    5TH TOE  . 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 Breast cancer Mother 60  . Heart  disease Father 15  . Asthma Neg Hx     Social History:  reports that he has quit smoking. His smoking use included Cigarettes and Cigars. He has a 30.00 pack-year smoking history. He quit smokeless tobacco use about 9 months ago. He reports that he drinks alcohol. He reports that he does not use drugs.  Allergies:  Allergies  Allergen Reactions  . Xarelto [Rivaroxaban] Rash    Medications:  Scheduled: . aspirin EC  81 mg Oral Daily  . atorvastatin  80 mg Oral q1800  . cholecalciferol  1,000 Units Oral Daily  . furosemide  40 mg Oral Daily  . gabapentin  300 mg Oral TID  . insulin aspart  0-15 Units Subcutaneous TID WC  . insulin aspart  0-5 Units Subcutaneous QHS  . insulin aspart  5 Units Subcutaneous TID WC  . isosorbide mononitrate  60 mg Oral Daily  . metoprolol tartrate  25 mg Oral BID  . mometasone-formoterol  2 puff Inhalation BID  . potassium chloride SA  20 mEq Oral Daily  . sodium chloride flush  3 mL Intravenous Q12H    Results for orders placed or performed during the hospital encounter of 06/01/17 (from the past 48 hour(s))  Basic metabolic panel     Status: Abnormal   Collection Time: 06/01/17  1:31 AM  Result Value Ref Range   Sodium 136 135 - 145 mmol/L   Potassium 4.1 3.5 - 5.1 mmol/L   Chloride 100 (L) 101 - 111 mmol/L   CO2 29 22 - 32 mmol/L   Glucose, Bld 211 (H) 65 - 99 mg/dL   BUN 24 (H) 6 - 20 mg/dL   Creatinine, Ser 1.13 0.61 - 1.24 mg/dL   Calcium 8.6 (L) 8.9 - 10.3 mg/dL   GFR calc non Af Amer >60 >60 mL/min   GFR calc Af Amer >60 >60 mL/min    Comment: (NOTE) The eGFR has been calculated using the CKD EPI equation. This calculation has not been validated in all clinical situations. eGFR's persistently <60 mL/min signify possible Chronic Kidney Disease.    Anion gap 7 5 - 15  CBC     Status: Abnormal   Collection Time: 06/01/17  1:31 AM  Result Value Ref Range   WBC 5.5 4.0 - 10.5 K/uL   RBC 3.33 (L) 4.22 - 5.81 MIL/uL   Hemoglobin 9.9  (L) 13.0 - 17.0 g/dL   HCT 31.2 (L) 39.0 - 52.0 %   MCV 93.7 78.0 - 100.0 fL   MCH 29.7 26.0 - 34.0 pg   MCHC 31.7 30.0 - 36.0 g/dL   RDW 16.1 (H) 11.5 - 15.5 %   Platelets 260 150 - 400 K/uL  I-stat troponin, ED     Status: Abnormal   Collection Time: 06/01/17  1:41  AM  Result Value Ref Range   Troponin i, poc 1.05 (HH) 0.00 - 0.08 ng/mL   Comment NOTIFIED PHYSICIAN    Comment 3            Comment: Due to the release kinetics of cTnI, a negative result within the first hours of the onset of symptoms does not rule out myocardial infarction with certainty. If myocardial infarction is still suspected, repeat the test at appropriate intervals.   MRSA PCR Screening     Status: None   Collection Time: 06/01/17  5:51 AM  Result Value Ref Range   MRSA by PCR NEGATIVE NEGATIVE    Comment:        The GeneXpert MRSA Assay (FDA approved for NASAL specimens only), is one component of a comprehensive MRSA colonization surveillance program. It is not intended to diagnose MRSA infection nor to guide or monitor treatment for MRSA infections.   TSH     Status: None   Collection Time: 06/01/17  6:46 AM  Result Value Ref Range   TSH 2.584 0.350 - 4.500 uIU/mL    Comment: Performed by a 3rd Generation assay with a functional sensitivity of <=0.01 uIU/mL.  Troponin I     Status: Abnormal   Collection Time: 06/01/17  6:46 AM  Result Value Ref Range   Troponin I 1.44 (HH) <0.03 ng/mL    Comment: CRITICAL RESULT CALLED TO, READ BACK BY AND VERIFIED WITH: G.COOPER,RN [id number] 06/01/17 CLARK,S   Hemoglobin A1c     Status: Abnormal   Collection Time: 06/01/17  6:46 AM  Result Value Ref Range   Hgb A1c MFr Bld 7.2 (H) 4.8 - 5.6 %    Comment: (NOTE)         Pre-diabetes: 5.7 - 6.4         Diabetes: >6.4         Glycemic control for adults with diabetes: <7.0    Mean Plasma Glucose 160 mg/dL    Comment: (NOTE) Performed At: [facility] [place], [place]  [id number] Lindon Romp MD [id number]   Basic metabolic panel     Status: Abnormal   Collection Time: 06/01/17  6:46 AM  Result Value Ref Range   Sodium 138 135 - 145 mmol/L   Potassium 3.9 3.5 - 5.1 mmol/L   Chloride 101 101 - 111 mmol/L   CO2 30 22 - 32 mmol/L   Glucose, Bld 96 65 - 99 mg/dL   BUN 24 (H) 6 - 20 mg/dL   Creatinine, Ser 1.07 0.61 - 1.24 mg/dL   Calcium 8.7 (L) 8.9 - 10.3 mg/dL   GFR calc non Af Amer >60 >60 mL/min   GFR calc Af Amer >60 >60 mL/min    Comment: (NOTE) The eGFR has been calculated using the CKD EPI equation. This calculation has not been validated in all clinical situations. eGFR's persistently <60 mL/min signify possible Chronic Kidney Disease.    Anion gap 7 5 - 15  Lipid panel     Status: None   Collection Time: 06/01/17  6:46 AM  Result Value Ref Range   Cholesterol 131 0 - 200 mg/dL   Triglycerides 31 <150 mg/dL   HDL 45 >40 mg/dL   Total CHOL/HDL Ratio 2.9 RATIO   VLDL 6 0 - 40 mg/dL   LDL Cholesterol 80 0 - 99 mg/dL    Comment:        Total Cholesterol/HDL:CHD Risk Coronary Heart Disease Risk Table  Men   Women  1/2 Average Risk   3.4   3.3  Average Risk       5.0   4.4  2 X Average Risk   9.6   7.1  3 X Average Risk  23.4   11.0        Use the calculated Patient Ratio above and the CHD Risk Table to determine the patient's CHD Risk.        ATP III CLASSIFICATION (LDL):  <100     mg/dL   Optimal  100-129  mg/dL   Near or Above                    Optimal  130-159  mg/dL   Borderline  160-189  mg/dL   High  >190     mg/dL   Very High   CBC     Status: Abnormal   Collection Time: 06/01/17  6:46 AM  Result Value Ref Range   WBC 5.7 4.0 - 10.5 K/uL   RBC 3.50 (L) 4.22 - 5.81 MIL/uL   Hemoglobin 10.2 (L) 13.0 - 17.0 g/dL   HCT 33.1 (L) 39.0 - 52.0 %   MCV 94.6 78.0 - 100.0 fL   MCH 29.1 26.0 - 34.0 pg   MCHC 30.8 30.0 - 36.0 g/dL   RDW 16.3 (H) 11.5 - 15.5 %   Platelets 285 150 - 400 K/uL   Glucose, capillary     Status: None   Collection Time: 06/01/17  8:13 AM  Result Value Ref Range   Glucose-Capillary 98 65 - 99 mg/dL  Heparin level (unfractionated)     Status: Abnormal   Collection Time: 06/01/17 11:51 AM  Result Value Ref Range   Heparin Unfractionated 0.74 (H) 0.30 - 0.70 IU/mL    Comment:        IF HEPARIN RESULTS ARE BELOW EXPECTED VALUES, AND PATIENT DOSAGE HAS BEEN CONFIRMED, SUGGEST FOLLOW UP TESTING OF ANTITHROMBIN III LEVELS.   Troponin I     Status: Abnormal   Collection Time: 06/01/17 11:51 AM  Result Value Ref Range   Troponin I 1.41 (HH) <0.03 ng/mL    Comment: CRITICAL VALUE NOTED.  VALUE IS CONSISTENT WITH PREVIOUSLY REPORTED AND CALLED VALUE.  Protime-INR     Status: None   Collection Time: 06/01/17 11:51 AM  Result Value Ref Range   Prothrombin Time 13.9 11.4 - 15.2 seconds   INR 1.06   Glucose, capillary     Status: Abnormal   Collection Time: 06/01/17 12:04 PM  Result Value Ref Range   Glucose-Capillary 109 (H) 65 - 99 mg/dL  Glucose, capillary     Status: Abnormal   Collection Time: 06/01/17  5:58 PM  Result Value Ref Range   Glucose-Capillary 134 (H) 65 - 99 mg/dL  Troponin I     Status: Abnormal   Collection Time: 06/01/17  6:36 PM  Result Value Ref Range   Troponin I 1.15 (HH) <0.03 ng/mL    Comment: CRITICAL VALUE NOTED.  VALUE IS CONSISTENT WITH PREVIOUSLY REPORTED AND CALLED VALUE.  Glucose, capillary     Status: Abnormal   Collection Time: 06/01/17  9:03 PM  Result Value Ref Range   Glucose-Capillary 205 (H) 65 - 99 mg/dL  CBC     Status: Abnormal   Collection Time: 06/02/17  5:24 AM  Result Value Ref Range   WBC 5.2 4.0 - 10.5 K/uL   RBC 3.27 (L) 4.22 - 5.81 MIL/uL  Hemoglobin 9.7 (L) 13.0 - 17.0 g/dL   HCT 31.2 (L) 39.0 - 52.0 %   MCV 95.4 78.0 - 100.0 fL   MCH 29.7 26.0 - 34.0 pg   MCHC 31.1 30.0 - 36.0 g/dL   RDW 16.5 (H) 11.5 - 15.5 %   Platelets 260 150 - 400 K/uL  Basic metabolic panel     Status: Abnormal    Collection Time: 06/02/17  5:24 AM  Result Value Ref Range   Sodium 136 135 - 145 mmol/L   Potassium 4.0 3.5 - 5.1 mmol/L   Chloride 102 101 - 111 mmol/L   CO2 26 22 - 32 mmol/L   Glucose, Bld 148 (H) 65 - 99 mg/dL   BUN 15 6 - 20 mg/dL   Creatinine, Ser 0.77 0.61 - 1.24 mg/dL   Calcium 8.3 (L) 8.9 - 10.3 mg/dL   GFR calc non Af Amer >60 >60 mL/min   GFR calc Af Amer >60 >60 mL/min    Comment: (NOTE) The eGFR has been calculated using the CKD EPI equation. This calculation has not been validated in all clinical situations. eGFR's persistently <60 mL/min signify possible Chronic Kidney Disease.    Anion gap 8 5 - 15  Glucose, capillary     Status: Abnormal   Collection Time: 06/02/17  7:20 AM  Result Value Ref Range   Glucose-Capillary 133 (H) 65 - 99 mg/dL  Heparin level (unfractionated)     Status: Abnormal   Collection Time: 06/02/17 10:16 AM  Result Value Ref Range   Heparin Unfractionated 0.25 (L) 0.30 - 0.70 IU/mL    Comment:        IF HEPARIN RESULTS ARE BELOW EXPECTED VALUES, AND PATIENT DOSAGE HAS BEEN CONFIRMED, SUGGEST FOLLOW UP TESTING OF ANTITHROMBIN III LEVELS.   Glucose, capillary     Status: Abnormal   Collection Time: 06/02/17 11:22 AM  Result Value Ref Range   Glucose-Capillary 104 (H) 65 - 99 mg/dL  Glucose, capillary     Status: Abnormal   Collection Time: 06/02/17  2:11 PM  Result Value Ref Range   Glucose-Capillary 57 (L) 65 - 99 mg/dL  Glucose, capillary     Status: None   Collection Time: 06/02/17  2:47 PM  Result Value Ref Range   Glucose-Capillary 83 65 - 99 mg/dL  Glucose, capillary     Status: Abnormal   Collection Time: 06/02/17  4:23 PM  Result Value Ref Range   Glucose-Capillary 133 (H) 65 - 99 mg/dL  Glucose, capillary     Status: Abnormal   Collection Time: 06/02/17  6:08 PM  Result Value Ref Range   Glucose-Capillary 198 (H) 65 - 99 mg/dL    Dg Chest 2 View  Result Date: 06/01/2017 CLINICAL DATA:  Acute onset of generalized  chest pain and shortness of breath. Initial encounter. EXAM: CHEST  2 VIEW COMPARISON:  Chest radiograph performed 01/10/2017 FINDINGS: The lungs are well-aerated. Minimal bibasilar atelectasis is noted. There is no evidence of pleural effusion or pneumothorax. The heart is borderline normal in size. No acute osseous abnormalities are seen. IMPRESSION: Minimal bibasilar atelectasis noted.  Lungs otherwise clear. Electronically Signed   By: Garald Balding M.D.   On: 06/01/2017 01:55    Review of Systems  Constitutional: Positive for malaise/fatigue. Negative for weight loss.  Eyes:       Impaired peripheral vision  Respiratory: Positive for shortness of breath.   Cardiovascular: Positive for chest pain and leg swelling.  Neurological: Positive for  speech change and weakness.       Peripheral neuropathy   Blood pressure 121/61, pulse 62, temperature 98.1 F (36.7 C), temperature source Oral, resp. rate 19, height '6\' 4"'  (1.93 m), weight (!) 366 lb 6.4 oz (166.2 kg), SpO2 98 %. Physical Exam  Vitals reviewed. Constitutional: He is oriented to person, place, and time. No distress.  Morbidly obese  HENT:  Head: Normocephalic and atraumatic.  Mouth/Throat: No oropharyngeal exudate.  Eyes: Conjunctivae and EOM are normal. No scleral icterus.  Neck:  No bruit  Cardiovascular: Normal rate, regular rhythm and normal heart sounds.   No murmur heard. Respiratory: Effort normal. No respiratory distress. He has no wheezes.  Diminished BS bilaterally  GI: Soft. He exhibits no distension. There is no tenderness.  Musculoskeletal: He exhibits deformity (s/p transmetatarsal amputation right).  Neurological: He is alert and oriented to person, place, and time. No cranial nerve deficit.  Skin: He is not diaphoretic.  Venous stasis changes with erythema BLE   CARDIAC CATHETERIZATION Conclusion     Mid LAD lesion, 60 %stenosed.  Dist LAD lesion, 80 %stenosed.  Ost Cx to Prox Cx lesion, 30  %stenosed.  Prox RCA-1 lesion, 80 %stenosed.  Prox RCA-2 lesion, 99 %stenosed.  Mid RCA to Dist RCA lesion, 20 %stenosed.  Ost 2nd Mrg to 2nd Mrg lesion, 90 %stenosed.  Prox Cx to Mid Cx lesion, 20 %stenosed.  Mid Cx lesion, 60 %stenosed.  Ost LAD to Prox LAD lesion, 20 %stenosed.   1. Severe triple vessel CAD 2. The LAD is a heavily calcified vessel that courses to the apex. The proximal vessel has diffuse severe calcification. The mid vessel has a focal moderate stenosis. The distal vessel has high grade diffuse stenosis, unchanged from last cath.  3. The Circumflex is heavily calcified vessel with mild proximal and mid stenosis. The second OM branch is moderate in caliber with severe proximal calcified stenosis. The distal Circumflex has moderate disease.  4. The large, dominant RCA has a complex, heavily calcified severe stenosis in the proximal segment before and after a Shepherd's crook (bend in the vessel).    I personally reviewed the cath films and concur with the findings noted above  Assessment/Plan: 76 yo man with medical problems too numerous to list. The most critical ones include CAD, CHF, CVA, deconditioning, right foot amputation, venous stasis, morbid obesity, COPD and OSA. He presented with a non-STEMI with a mildly elevated troponin. At catheterization he has 3 veesel CAD but with the primary issue being a tight lesion in the proximal RCA that is not amenable to PCI. The vessel would be graftable if he were a surgical candidate.  Unfortunately, he is a poor candidate for surgery. Even if he could get through the perioperative period, which is uncertain at best, I do not see any way he would thrive after an operation of that magnitude. The best case most likely would be NHP. He does not want that. His wife is in agreement that she does not think he can tolerate a major surgery.  Discussed with Dr. Marlou Porch.   I think the best option is maximal medical therapy, possibly  with the addition of ranexa.  Melrose Nakayama 06/02/2017, 6:39 PM

## 2017-06-02 NOTE — Progress Notes (Signed)
ANTICOAGULATION CONSULT NOTE - Follow Up Consult  Pharmacy Consult for Heparin Indication: CAD s/p cath pending TCTS eval  Allergies  Allergen Reactions  . Xarelto [Rivaroxaban] Rash    Patient Measurements: Height: 6\' 4"  (193 cm) Weight: (!) 366 lb 6.4 oz (166.2 kg) IBW/kg (Calculated) : 86.8 Heparin Dosing Weight: 125 kg  Vital Signs: BP: 118/67 (06/21 0600) Pulse Rate: 64 (06/21 0500)  Labs:  Recent Labs  06/01/17 0131 06/01/17 0646 06/01/17 1151 06/01/17 1836 06/02/17 0524 06/02/17 1016  HGB 9.9* 10.2*  --   --  9.7*  --   HCT 31.2* 33.1*  --   --  31.2*  --   PLT 260 285  --   --  260  --   LABPROT  --   --  13.9  --   --   --   INR  --   --  1.06  --   --   --   HEPARINUNFRC  --   --  0.74*  --   --  0.25*  CREATININE 1.13 1.07  --   --  0.77  --   TROPONINI  --  1.44* 1.41* 1.15*  --   --     Estimated Creatinine Clearance: 133.8 mL/min (by C-G formula based on SCr of 0.77 mg/dL).   Medications:  Scheduled:  . aspirin EC  81 mg Oral Daily  . atorvastatin  80 mg Oral q1800  . cholecalciferol  1,000 Units Oral Daily  . furosemide  40 mg Oral Daily  . gabapentin  300 mg Oral TID  . insulin aspart  0-15 Units Subcutaneous TID WC  . insulin aspart  0-5 Units Subcutaneous QHS  . insulin aspart  15 Units Subcutaneous TID WC  . isosorbide mononitrate  60 mg Oral Daily  . metoprolol tartrate  25 mg Oral BID  . mometasone-formoterol  2 puff Inhalation BID  . potassium chloride SA  20 mEq Oral Daily  . sodium chloride flush  3 mL Intravenous Q12H   Infusions:  . sodium chloride 250 mL (06/02/17 0031)  . heparin 1,650 Units/hr (06/02/17 0031)    Assessment: 76 yo M admitted with CP/pressure.  Pt is s/p cardiac cath today which showed severe 3v disease.  Have attempted PCI of these vessels in the past, unsuccessfully.  Plan to consult CT surgery.  Initial heparin level is slightly subtherapeutic at 0.25 on heparin gtt at 1650 units/hr. CBC stable. CTS  consult pending.   Goal of Therapy:  Heparin level 0.3-0.7 units/ml Monitor platelets by anticoagulation protocol: Yes   Plan:  1. Increase heparin infusion to 1750 units/hr  2. Repeat heparin level in 8 hours  3. Daily heparin level and CBC while on heparin   Vincenza Hews, PharmD, BCPS 06/02/2017, 12:14 PM

## 2017-06-02 NOTE — Discharge Summary (Signed)
Discharge Summary    Patient ID: Charles [name],  MRN: [id number], DOB/AGE: Mar 12, 1941 76 y.o.  Admit date: 06/01/2017 Discharge date: 06/02/2017  Primary Care Provider: Cassandria Anger Primary Cardiologist: Lizbeth Bark, MD   Discharge Diagnoses    Principal Problem:   NSTEMI (non-ST elevated myocardial infarction) [facility]) Active Problems:   CAD (coronary artery disease)   Essential hypertension   Type 2 diabetes, uncontrolled, with retinopathy ([place])   Morbid obesity ([place])   COPD    History of CVA (cerebrovascular accident)   PAF (paroxysmal atrial fibrillation) ([name])   Rash and nonspecific skin eruption   Allergies Allergies  Allergen Reactions  . Xarelto [Rivaroxaban] Rash    Diagnostic Studies/Procedures    2D Echocardiogram 6.20.2018  Study Conclusions   - Left ventricle: The cavity size was normal. There was severe   concentric hypertrophy. Systolic function was normal. The   estimated ejection fraction was in the range of 60% to 65%. Wall   motion was normal; there were no regional wall motion   abnormalities. Features are consistent with a pseudonormal left   ventricular filling pattern, with concomitant abnormal relaxation   and increased filling pressure (grade 2 diastolic dysfunction). - Aortic valve: Trileaflet; normal thickness, mildly calcified   leaflets. - Aorta: Ascending aorta diameter: 39 mm (ED). - Ascending aorta: The ascending aorta was mildly dilated. - Mitral valve: Calcified annulus. There was trivial regurgitation. - Left atrium: The atrium was mildly to moderately dilated.   Anterior-posterior dimension: 47 mm. - Right ventricle: The cavity size was mildly dilated. Wall   thickness was normal. - Pulmonic valve: There was mild regurgitation. - Pulmonary arteries: PA peak pressure: 40 mm Hg (S). _____________  Cardiac Catheterization 6.20.2018  Coronary Findings  Dominance: Right  Left Anterior Descending  Ost LAD to Prox LAD  lesion, 20% stenosed. The lesion is severely calcified.  Mid LAD lesion, 60% stenosed. The lesion is severely calcified.  Dist LAD lesion, 80% stenosed.  Left Circumflex  Ost Cx to Prox Cx lesion, 30% stenosed. The lesion is severely calcified.  Prox Cx to Mid Cx lesion, 20% stenosed. The lesion is severely calcified.  Mid Cx lesion, 60% stenosed. The lesion is severely calcified.  First Obtuse Marginal Branch  Vessel is small in size.  Second Obtuse Marginal Branch  Vessel is moderate in size.  Ost 2nd Mrg to 2nd Mrg lesion, 90% stenosed. The lesion is severely calcified.  Right Coronary Artery  Prox RCA-1 lesion, 80% stenosed. The lesion is severely calcified.  Prox RCA-2 lesion, 99% stenosed. The lesion is type C and located at the bend. The lesion is severely calcified.  Mid RCA to Dist RCA lesion, 20% stenosed.  Right Posterior Descending Artery  Vessel is moderate in size.     History of Present Illness     76 y/o ? with a h/o HTN, PAF (not on anticoagulation), CVA, HL, obesity, COPD, and DMII.  He was in his usual state of health until the evening of 6/19, when he began to experience chest pain that was unrelieved by sublingual NTG.  He called EMS and was transported to the [facility] ED where initial troponin was 1.05.  ECG showed new inferior TWI and nonspecific lateral ST/T changes.  He was admitted for further evaluation.  Hospital Course     Consultants: CT Surgery   Charles [name] peaked his troponin at 1.44.  He was maintained on asa, plavix,  blocker, nitrate, heparin, and high potency statin  therapy.  He underwent diagnostic catheterization on 6/20 revealing severe multivessel CAD.  He had already had one attempt at failed PCI of the RCA and thus a surgical consult was sought.  Echo showed normal LV function.  He was seen by thoracic surgery and due to multiple co-morbidities, he was not felt to be a good candidate for bypass surgery.  Medical therapy has been recommended.  We have  started ranexa therapy.  Charles [name] has had no further chest pain and will be discharged home today in good condition.   _____________  Discharge Vitals Blood pressure 121/61, pulse 62, temperature 98.1 F (36.7 C), temperature source Oral, resp. rate 19, height 6\' 4"  (1.93 m), weight (!) 366 lb 6.4 oz (166.2 kg), SpO2 98 %.  Filed Weights   06/01/17 0118 06/01/17 0530  Weight: (!) 370 lb (167.8 kg) (!) 366 lb 6.4 oz (166.2 kg)    Labs & Radiologic Studies    CBC  Recent Labs  06/01/17 0646 06/02/17 0524  WBC 5.7 5.2  HGB 10.2* 9.7*  HCT 33.1* 31.2*  MCV 94.6 95.4  PLT 285 161   Basic Metabolic Panel  Recent Labs  06/01/17 0646 06/02/17 0524  NA 138 136  K 3.9 4.0  CL 101 102  CO2 30 26  GLUCOSE 96 148*  BUN 24* 15  CREATININE 1.07 0.77  CALCIUM 8.7* 8.3*   Cardiac Enzymes  Recent Labs  06/01/17 0646 06/01/17 1151 06/01/17 1836  TROPONINI 1.44* 1.41* 1.15*   Hemoglobin A1C  Recent Labs  06/01/17 0646  HGBA1C 7.2*   Fasting Lipid Panel  Recent Labs  06/01/17 0646  CHOL 131  HDL 45  LDLCALC 80  TRIG 31  CHOLHDL 2.9   Thyroid Function Tests  Recent Labs  06/01/17 0646  TSH 2.584   _____________  Dg Chest 2 View  Result Date: 06/01/2017 CLINICAL DATA:  Acute onset of generalized chest pain and shortness of breath. Initial encounter. EXAM: CHEST  2 VIEW COMPARISON:  Chest radiograph performed 01/10/2017 FINDINGS: The lungs are well-aerated. Minimal bibasilar atelectasis is noted. There is no evidence of pleural effusion or pneumothorax. The heart is borderline normal in size. No acute osseous abnormalities are seen. IMPRESSION: Minimal bibasilar atelectasis noted.  Lungs otherwise clear. Electronically Signed   By: Garald Balding M.D.   On: 06/01/2017 01:55   Disposition   Pt is being discharged home today in good condition.  Follow-up Plans & Appointments    Follow-up Information    [place], Crista Luria, [place] Follow up on 06/22/2017.     Specialty:  Cardiology Why:  8:00 AM - Dr. Harrington Challenger' PA Contact information: [address] [place] [address] [phone]           Discharge Medications   Current Discharge Medication List    START taking these medications   Details  atorvastatin (LIPITOR) 80 MG tablet Take 1 tablet (80 mg total) by mouth daily at 6 PM. Qty: 90 tablet, Refills: 3    ranolazine (RANEXA) 500 MG 12 hr tablet Take 1 tablet (500 mg total) by mouth 2 (two) times daily. Qty: 180 tablet, Refills: 3      CONTINUE these medications which have NOT CHANGED   Details  acetaminophen (TYLENOL) 500 MG tablet Take 1,000 mg by mouth at bedtime.     aspirin EC 81 MG tablet Take 81 mg by mouth daily.     cholecalciferol (VITAMIN D) 1000 units tablet Take 1,000 Units by mouth  daily.     clopidogrel (PLAVIX) 75 MG tablet Take 1 tablet (75 mg total) by mouth daily. Qty: 90 tablet, Refills: 3    clotrimazole-betamethasone (LOTRISONE) cream Apply 1 application topically 2 (two) times daily. Qty: 45 g, Refills: 1    collagenase (SANTYL) ointment Apply 1 application topically daily. Qty: 15 g, Refills: 3    Fluticasone-Salmeterol (ADVAIR DISKUS) 100-50 MCG/DOSE AEPB Inhale 1 puff into the lungs 2 (two) times daily. Qty: 3 each, Refills: 3    furosemide (LASIX) 80 MG tablet Take 1/2 tablet daily alternating with whole tablet daily Qty: 90 tablet, Refills: 3   Associated Diagnoses: Atherosclerosis of native coronary artery of native heart without angina pectoris; SOB (shortness of breath)    gabapentin (NEURONTIN) 300 MG capsule Take 1 capsule (300 mg total) by mouth 3 (three) times daily. Qty: 270 capsule, Refills: 3    insulin aspart (NOVOLOG) 100 UNIT/ML FlexPen Inject 30 Units into the skin 4 (four) times daily - after meals and at bedtime. Follow SS Qty: 15 pen, Refills: 3    isosorbide mononitrate (IMDUR) 60 MG 24 hr tablet Take 1 tablet (60 mg total) by mouth daily. Qty: 90 tablet,  Refills: 3    metFORMIN (GLUCOPHAGE) 500 MG tablet Take 1 tablet (500 mg total) by mouth 2 (two) times daily with a meal. Qty: 180 tablet, Refills: 3    metoprolol tartrate (LOPRESSOR) 25 MG tablet Take 1 tablet (25 mg total) by mouth 2 (two) times daily. Qty: 180 tablet, Refills: 2    nitroGLYCERIN (NITROSTAT) 0.4 MG SL tablet Place 1 tablet (0.4 mg total) under the tongue every 5 (five) minutes as needed (up to 3 doses). For chest pain Qty: 25 tablet, Refills: 3    pioglitazone (ACTOS) 45 MG tablet Take 1 tablet (45 mg total) by mouth daily. Qty: 90 tablet, Refills: 3    potassium chloride SA (K-DUR,KLOR-CON) 20 MEQ tablet Take 1 tablet (20 mEq total) by mouth daily. Qty: 90 tablet, Refills: 3    triamcinolone cream (KENALOG) 0.5 % Apply 1 application topically 2 (two) times daily as needed (rash). Qty: 30 g, Refills: 3    vitamin B-12 (CYANOCOBALAMIN) 1000 MCG tablet Take 1,000 mcg by mouth daily.       STOP taking these medications     clindamycin (CLEOCIN) 300 MG capsule      ibuprofen (ADVIL,MOTRIN) 800 MG tablet      losartan (COZAAR) 100 MG tablet      pravastatin (PRAVACHOL) 40 MG tablet          Aspirin prescribed at discharge?  Yes High Intensity Statin Prescribed? (Lipitor 40-80mg  or Crestor 20-40mg ): Yes Beta Blocker Prescribed? Yes For EF <40%, was ACEI/ARB Prescribed? No: N/A ADP Receptor Inhibitor Prescribed? (i.e. Plavix etc.-Includes Medically Managed Patients): Yes For EF <40%, Aldosterone Inhibitor Prescribed? No: N/A Was EF assessed during THIS hospitalization? Yes Was Cardiac Rehab II ordered? (Included Medically managed Patients): Yes   Outstanding Labs/Studies   None  Duration of Discharge Encounter   Greater than 30 minutes including physician time.  Signed, Murray Hodgkins NP 06/02/2017, 7:44 PM  Personally seen and examined. Agree with above.   Subjective  No current CP. Cath yesterday, no change, see below. He and his wife are  upset that no one before told him about the extent of his CAD prior to yesterday. I explained to them that it would be very unusual for the news not to be delivered after both cath's last year. They are  frustrated. I tried to calm their frustrations.   Inpatient Medications    Scheduled Meds: . aspirin EC  81 mg Oral Daily  . atorvastatin  80 mg Oral q1800  . cholecalciferol  1,000 Units Oral Daily  . furosemide  40 mg Oral Daily  . gabapentin  300 mg Oral TID  . insulin aspart  0-15 Units Subcutaneous TID WC  . insulin aspart  0-5 Units Subcutaneous QHS  . insulin aspart  15 Units Subcutaneous TID WC  . isosorbide mononitrate  60 mg Oral Daily  . metoprolol tartrate  25 mg Oral BID  . mometasone-formoterol  2 puff Inhalation BID  . potassium chloride SA  20 mEq Oral Daily  . sodium chloride flush  3 mL Intravenous Q12H   Continuous Infusions: . sodium chloride 250 mL (06/02/17 0031)  . heparin 1,650 Units/hr (06/02/17 0031)   PRN Meds: sodium chloride, acetaminophen, morphine injection, nitroGLYCERIN, ondansetron (ZOFRAN) IV, sodium chloride flush   Vital Signs          Vitals:   06/01/17 2300 06/01/17 2313 06/02/17 0500 06/02/17 0600  BP: (!) 107/55   118/67  Pulse: 75 80 64   Resp: 20 16 18    Temp:      TempSrc:      SpO2: 95% 95% 98%   Weight:      Height:        Intake/Output Summary (Last 24 hours) at 06/02/17 1017 Last data filed at 06/02/17 0900  Gross per 24 hour  Intake            426.5 ml  Output             1450 ml  Net          -1023.5 ml       Filed Weights   06/01/17 0118 06/01/17 0530  Weight: (!) 370 lb (167.8 kg) (!) 366 lb 6.4 oz (166.2 kg)    Telemetry    Sinus rhythm, PACs - Personally Reviewed  ECG    Sinus rhythm with nonspecific ST-T wave changes, T-wave inversions inferior - Personally Reviewed  Physical Exam   GEN:No acute distress.   Neck:No JVD Cardiac:RRR, no murmurs, rubs, or  gallops.  Respiratory:Clear to auscultation bilaterally. ZJ:IRCV, nontender, non-distended obese MS:No edema; No deformity. Tender right pretibial region, minor ecchymosis at site of prior fall. Neuro:Nonfocal  Psych: Normal affect   Labs    Chemistry  Last Labs    Recent Labs Lab 06/01/17 0131 06/01/17 0646 06/02/17 0524  NA 136 138 136  K 4.1 3.9 4.0  CL 100* 101 102  CO2 29 30 26   GLUCOSE 211* 96 148*  BUN 24* 24* 15  CREATININE 1.13 1.07 0.77  CALCIUM 8.6* 8.7* 8.3*  GFRNONAA >60 >60 >60  GFRAA >60 >60 >60  ANIONGAP 7 7 8        Hematology  Last Labs    Recent Labs Lab 06/01/17 0131 06/01/17 0646 06/02/17 0524  WBC 5.5 5.7 5.2  RBC 3.33* 3.50* 3.27*  HGB 9.9* 10.2* 9.7*  HCT 31.2* 33.1* 31.2*  MCV 93.7 94.6 95.4  MCH 29.7 29.1 29.7  MCHC 31.7 30.8 31.1  RDW 16.1* 16.3* 16.5*  PLT 260 285 260      Cardiac Enzymes  Last Labs    Recent Labs Lab 06/01/17 0646 06/01/17 1151 06/01/17 1836  TROPONINI 1.44* 1.41* 1.15*       Last Labs    Recent Labs Lab 06/01/17 0141  TROPIPOC  1.05*       BNP Last Labs   No results for input(s): BNP, PROBNP in the last 168 hours.     DDimer  Last Labs   No results for input(s): DDIMER in the last 168 hours.     Radiology     Imaging Results (Last 48 hours)  Dg Chest 2 View  Result Date: 06/01/2017 CLINICAL DATA:  Acute onset of generalized chest pain and shortness of breath. Initial encounter. EXAM: CHEST  2 VIEW COMPARISON:  Chest radiograph performed 01/10/2017 FINDINGS: The lungs are well-aerated. Minimal bibasilar atelectasis is noted. There is no evidence of pleural effusion or pneumothorax. The heart is borderline normal in size. No acute osseous abnormalities are seen. IMPRESSION: Minimal bibasilar atelectasis noted.  Lungs otherwise clear. Electronically Signed   By: Garald Balding M.D.   On: 06/01/2017 01:55     Cardiac Studies   Echocardiogram 01/12/17 - Left  ventricle: The cavity size was normal. Wall thickness was increased in a pattern of mild LVH. Systolic function was normal. The estimated ejection fraction was in the range of 60% to 65%. Wall motion was normal; there were no regional wall motion abnormalities. Features are consistent with a pseudonormal left ventricular filling pattern, with concomitant abnormal relaxation and increased filling pressure (grade 2 diastolic dysfunction). - Aortic valve: Moderately calcified annulus. - Mitral valve: Mildly calcified annulus. - Left atrium: The atrium was mildly dilated. - Right atrium: The atrium was mildly dilated. - Pulmonary arteries: Systolic pressure was mildly increased. PA peak pressure: 35 mm Hg (S).  Impressions:  - The echo is of very poor quality - technically difficult. The valves are not seen well at all.  Cardiac catheterization 9/5-06/2016    Prox RCA lesion, 90 %stenosed.  Post intervention, there is a 90% residual stenosis.  1. Unsuccessful PCI of the proximal RCA due to inability to cross with stent or adequate sized balloon. 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  Plan: aggressive medical therapy. If patient has refractory limiting angina would need to consider for CABG.   [place]  Diagnostic Diagram       Post-Intervention Diagram          Patient Profile     76 y.o. male with non-ST elevation myocardial infarction  Assessment & Plan    Non-ST elevation myocardial infarction  - IV heparin will stop this afternoon, beta blocker as tolerated (BP), nitroglycerin as needed, statin, holding Plavix  - Prior cardiac catheterization reviewed from September 2017, Dr. Judee Clara to stent right coronary artery successfully, inability to cross lesion with stent, shepherd's crook. Current cath no change. Dr. Angelena Form explained to them.  -Consulted  surgery for possible bypass.  Not an optimal candidate. Explained to him. See below. Will try RANEXA  - Pain described as squeezing chest pain consistent with prior anginal symptoms. T-wave inversions noted inferior leads.  Essential hypertension  - Currently hypotensive, holding antihypertensives except for metoprolol.  - Continue to hold losartan.   COPD  - Home inhalers  Diabetes with hypertension  - Holding by mouth meds. Insulin sliding scale.  Chronic diastolic heart failure  - Related to his morbid obesity  - Home dose Lasix.  Morbid obesity  - Encourage weight loss.  - 370 pounds. Stated that he should try to lose 150 pounds. He is at increased mortality risk.      Discussed with Dr. Roxan Hockey. Appreciate his consultation. Not an  optimal surgical candidate. Would be extremely high risk for prolonged debilitation. He and his wife understand.  We will begin Ranexa 500 mg twice a day. I did explain to him that occasionally this medication is very expensive. If it is, he states that he will not take it understandably.  He has not received losartan since his hospitalization. Blood pressures have been excellent. We will stop.  We will restart Plavix and aspirin.  Continue isosorbide 60.  Changed pravastatin to atorvastatin  No other changes made to his home medications.  He desires to go home this evening. I'm comfortable with this. He has had no adverse arrhythmias. Ejection fraction is normal on echocardiogram.  He will follow-up with Dr. Alain Marion. I also encouraged him to follow-up with Dr. Harrington Challenger his cardiologist.   Candee Furbish, MD

## 2017-06-22 ENCOUNTER — Ambulatory Visit: Payer: Medicare Other | Admitting: Physician Assistant

## 2017-07-01 ENCOUNTER — Other Ambulatory Visit: Payer: Self-pay

## 2017-08-16 ENCOUNTER — Encounter: Payer: Self-pay | Admitting: Internal Medicine

## 2017-08-16 ENCOUNTER — Other Ambulatory Visit (INDEPENDENT_AMBULATORY_CARE_PROVIDER_SITE_OTHER): Payer: Medicare Other

## 2017-08-16 ENCOUNTER — Ambulatory Visit (INDEPENDENT_AMBULATORY_CARE_PROVIDER_SITE_OTHER): Payer: Medicare Other | Admitting: Internal Medicine

## 2017-08-16 VITALS — BP 118/64 | HR 73 | Temp 98.3°F | Ht 76.0 in | Wt 340.0 lb

## 2017-08-16 DIAGNOSIS — E1165 Type 2 diabetes mellitus with hyperglycemia: Secondary | ICD-10-CM | POA: Diagnosis not present

## 2017-08-16 DIAGNOSIS — Z23 Encounter for immunization: Secondary | ICD-10-CM | POA: Diagnosis not present

## 2017-08-16 DIAGNOSIS — K0510 Chronic gingivitis, plaque induced: Secondary | ICD-10-CM

## 2017-08-16 DIAGNOSIS — E11319 Type 2 diabetes mellitus with unspecified diabetic retinopathy without macular edema: Secondary | ICD-10-CM

## 2017-08-16 DIAGNOSIS — I200 Unstable angina: Secondary | ICD-10-CM

## 2017-08-16 DIAGNOSIS — IMO0002 Reserved for concepts with insufficient information to code with codable children: Secondary | ICD-10-CM

## 2017-08-16 LAB — BASIC METABOLIC PANEL
BUN: 16 mg/dL (ref 6–23)
CO2: 28 mEq/L (ref 19–32)
Calcium: 9.2 mg/dL (ref 8.4–10.5)
Chloride: 101 mEq/L (ref 96–112)
Creatinine, Ser: 0.83 mg/dL (ref 0.40–1.50)
GFR: 95.75 mL/min (ref >60.00)
GLUCOSE: 164 mg/dL — AB (ref 70–99)
POTASSIUM: 4.5 meq/L (ref 3.5–5.1)
Sodium: 136 mEq/L (ref 135–145)

## 2017-08-16 LAB — HEMOGLOBIN A1C: Hgb A1c MFr Bld: 7.3 % — ABNORMAL HIGH (ref 4.6–6.5)

## 2017-08-16 MED ORDER — METOPROLOL TARTRATE 25 MG PO TABS: 25.0000 mg | ORAL_TABLET | Freq: Two times a day (BID) | ORAL | 3 refills | Status: DC

## 2017-08-16 MED ORDER — PRAVASTATIN SODIUM 20 MG PO TABS: 20.0000 mg | ORAL_TABLET | Freq: Every day | ORAL | 3 refills | Status: DC

## 2017-08-16 MED ORDER — RANOLAZINE ER 500 MG PO TB12: 500.0000 mg | ORAL_TABLET | Freq: Two times a day (BID) | ORAL | 3 refills | Status: DC

## 2017-08-16 NOTE — Assessment & Plan Note (Signed)
Chronic w/caries. Dentist is too $$$ Use [employer] Peroxicare tooth paste Waterpik combo

## 2017-08-16 NOTE — Assessment & Plan Note (Addendum)
Ranexa, SL NTG prn Wt loss He stopped chewing tobacco!

## 2017-08-16 NOTE — Patient Instructions (Signed)
Waterpik combo Use [employer] Peroxicare tooth paste

## 2017-08-16 NOTE — Addendum Note (Signed)
Addended by: Karren Cobble on: 08/16/2017 04:15 PM   Modules accepted: Orders

## 2017-08-16 NOTE — Progress Notes (Signed)
Subjective:  Patient ID: Charles [name], male    DOB: 1941-03-27  Age: 76 y.o. MRN: [id number]  CC: No chief complaint on file.   HPI Charles [name] presents for CAD, angina,  DM, dyslipidemia f/u  Hx: "Charles [name] peaked his troponin at 1.44.  He was maintained on asa, plavix, ? blocker, nitrate, heparin, and high potency statin therapy.  He underwent diagnostic catheterization on 6/20 revealing severe multivessel CAD.  He had already had one attempt at failed PCI of the RCA and thus a surgical consult was sought.  Echo showed normal LV function.  He was seen by thoracic surgery and due to multiple co-morbidities, he was not felt to be a good candidate for bypass surgery.  Medical therapy has been recommended.  We have started ranexa therapy.  Charles [name] has had no further chest pain and will be discharged home today in good condition."   Outpatient Medications Prior to Visit  Medication Sig Dispense Refill  . acetaminophen (TYLENOL) 500 MG tablet Take 1,000 mg by mouth at bedtime.     Marland Kitchen aspirin EC 81 MG tablet Take 81 mg by mouth daily.     Marland Kitchen atorvastatin (LIPITOR) 80 MG tablet Take 1 tablet (80 mg total) by mouth daily at 6 PM. 90 tablet 3  . cholecalciferol (VITAMIN D) 1000 units tablet Take 1,000 Units by mouth daily.     . clopidogrel (PLAVIX) 75 MG tablet Take 1 tablet (75 mg total) by mouth daily. 90 tablet 3  . clotrimazole-betamethasone (LOTRISONE) cream Apply 1 application topically 2 (two) times daily. 45 g 1  . collagenase (SANTYL) ointment Apply 1 application topically daily. 15 g 3  . Fluticasone-Salmeterol (ADVAIR DISKUS) 100-50 MCG/DOSE AEPB Inhale 1 puff into the lungs 2 (two) times daily. 3 each 3  . furosemide (LASIX) 80 MG tablet Take 1/2 tablet daily alternating with whole tablet daily (Patient taking differently: Take 40-80 mg by mouth See admin instructions. Take 1/2 tablet daily alternating with whole tablet daily) 90 tablet 3  . gabapentin (NEURONTIN) 300 MG capsule  Take 1 capsule (300 mg total) by mouth 3 (three) times daily. 270 capsule 3  . insulin aspart (NOVOLOG) 100 UNIT/ML FlexPen Inject 30 Units into the skin 4 (four) times daily - after meals and at bedtime. Follow SS 15 pen 3  . isosorbide mononitrate (IMDUR) 60 MG 24 hr tablet Take 1 tablet (60 mg total) by mouth daily. 90 tablet 3  . metFORMIN (GLUCOPHAGE) 500 MG tablet Take 1 tablet (500 mg total) by mouth 2 (two) times daily with a meal. 180 tablet 3  . metoprolol tartrate (LOPRESSOR) 25 MG tablet Take 1 tablet (25 mg total) by mouth 2 (two) times daily. 180 tablet 2  . nitroGLYCERIN (NITROSTAT) 0.4 MG SL tablet Place 1 tablet (0.4 mg total) under the tongue every 5 (five) minutes as needed (up to 3 doses). For chest pain 25 tablet 3  . pioglitazone (ACTOS) 45 MG tablet Take 1 tablet (45 mg total) by mouth daily. 90 tablet 3  . potassium chloride SA (K-DUR,KLOR-CON) 20 MEQ tablet Take 1 tablet (20 mEq total) by mouth daily. 90 tablet 3  . ranolazine (RANEXA) 500 MG 12 hr tablet Take 1 tablet (500 mg total) by mouth 2 (two) times daily. 180 tablet 3  . triamcinolone cream (KENALOG) 0.5 % Apply 1 application topically 2 (two) times daily as needed (rash). 30 g 3  . vitamin B-12 (CYANOCOBALAMIN) 1000 MCG tablet Take 1,000 mcg  by mouth daily.      No facility-administered medications prior to visit.     ROS Review of Systems  Constitutional: Negative for appetite change, fatigue and unexpected weight change.  HENT: Positive for hearing loss. Negative for congestion, nosebleeds, sneezing, sore throat and trouble swallowing.   Eyes: Negative for itching and visual disturbance.  Respiratory: Negative for cough.   Cardiovascular: Positive for chest pain and leg swelling. Negative for palpitations.  Gastrointestinal: Negative for abdominal distention, blood in stool, diarrhea and nausea.  Genitourinary: Negative for frequency and hematuria.  Musculoskeletal: Positive for gait problem. Negative for  back pain, joint swelling and neck pain.  Skin: Negative for rash.  Neurological: Negative for dizziness, tremors, speech difficulty and weakness.  Psychiatric/Behavioral: Negative for agitation, dysphoric mood and sleep disturbance. The patient is not nervous/anxious.   occ angina  Objective:  BP 118/64 (BP Location: Left Arm, Patient Position: Sitting, Cuff Size: Large)   Pulse 73   Temp 98.3 F (36.8 C) (Oral)   Ht 6\' 4"  (1.93 m)   Wt (!) 340 lb (154.2 kg)   SpO2 97%   BMI 41.39 kg/m   BP Readings from Last 3 Encounters:  08/16/17 118/64  06/02/17 121/61  05/16/17 110/68    Wt Readings from Last 3 Encounters:  08/16/17 (!) 340 lb (154.2 kg)  06/01/17 (!) 366 lb 6.4 oz (166.2 kg)  05/16/17 (!) 362 lb (164.2 kg)    Physical Exam  Constitutional: He is oriented to person, place, and time. He appears well-developed. No distress.  NAD  HENT:  Mouth/Throat: Oropharynx is clear and moist.  Eyes: Pupils are equal, round, and reactive to light. Conjunctivae are normal.  Neck: Normal range of motion. No JVD present. No thyromegaly present.  Cardiovascular: Normal rate, regular rhythm, normal heart sounds and intact distal pulses.  Exam reveals no gallop and no friction rub.   No murmur heard. Pulmonary/Chest: Effort normal and breath sounds normal. No respiratory distress. He has no wheezes. He has no rales. He exhibits no tenderness.  Abdominal: Soft. Bowel sounds are normal. He exhibits no distension and no mass. There is no tenderness. There is no rebound and no guarding.  Musculoskeletal: Normal range of motion. He exhibits no edema or tenderness.  Lymphadenopathy:    He has no cervical adenopathy.  Neurological: He is alert and oriented to person, place, and time. He has normal reflexes. No cranial nerve deficit. He exhibits normal muscle tone. He displays a negative Romberg sign. Coordination and gait normal.  Skin: Skin is warm and dry. No rash noted.  Psychiatric: He has  a normal mood and affect. His behavior is normal. Judgment and thought content normal.  Obese Caries and gingivitis  Lab Results  Component Value Date   WBC 5.2 06/02/2017   HGB 9.7 (L) 06/02/2017   HCT 31.2 (L) 06/02/2017   PLT 260 06/02/2017   GLUCOSE 148 (H) 06/02/2017   CHOL 131 06/01/2017   TRIG 31 06/01/2017   HDL 45 06/01/2017   LDLCALC 80 06/01/2017   ALT 7 03/10/2017   AST 15 03/10/2017   NA 136 06/02/2017   K 4.0 06/02/2017   CL 102 06/02/2017   CREATININE 0.77 06/02/2017   BUN 15 06/02/2017   CO2 26 06/02/2017   TSH 2.584 06/01/2017   PSA 0.96 08/28/2010   INR 1.06 06/01/2017   HGBA1C 7.2 (H) 06/01/2017   MICROALBUR 1.4 08/28/2010    Dg Chest 2 View  Result Date: 06/01/2017 CLINICAL DATA:  Acute onset of generalized chest pain and shortness of breath. Initial encounter. EXAM: CHEST  2 VIEW COMPARISON:  Chest radiograph performed 01/10/2017 FINDINGS: The lungs are well-aerated. Minimal bibasilar atelectasis is noted. There is no evidence of pleural effusion or pneumothorax. The heart is borderline normal in size. No acute osseous abnormalities are seen. IMPRESSION: Minimal bibasilar atelectasis noted.  Lungs otherwise clear. Electronically Signed   By: Garald Balding M.D.   On: 06/01/2017 01:55    Assessment & Plan:   There are no diagnoses linked to this encounter. I am having Charles [name] maintain his vitamin B-12, triamcinolone cream, Fluticasone-Salmeterol, aspirin EC, collagenase, cholecalciferol, acetaminophen, clotrimazole-betamethasone, gabapentin, insulin aspart, clopidogrel, furosemide, metFORMIN, metoprolol tartrate, nitroGLYCERIN, pioglitazone, potassium chloride SA, isosorbide mononitrate, atorvastatin, and ranolazine.  No orders of the defined types were placed in this encounter.    Follow-up: No Follow-up on file.  [name], MD

## 2017-10-30 IMAGING — DX DG CHEST 2V
2 series · 2 of 2 positions shown · non-contrast
Comparison: 08/15/2016

CLINICAL DATA: Dyspnea.

EXAM:
CHEST  2 VIEW

[chest lat]
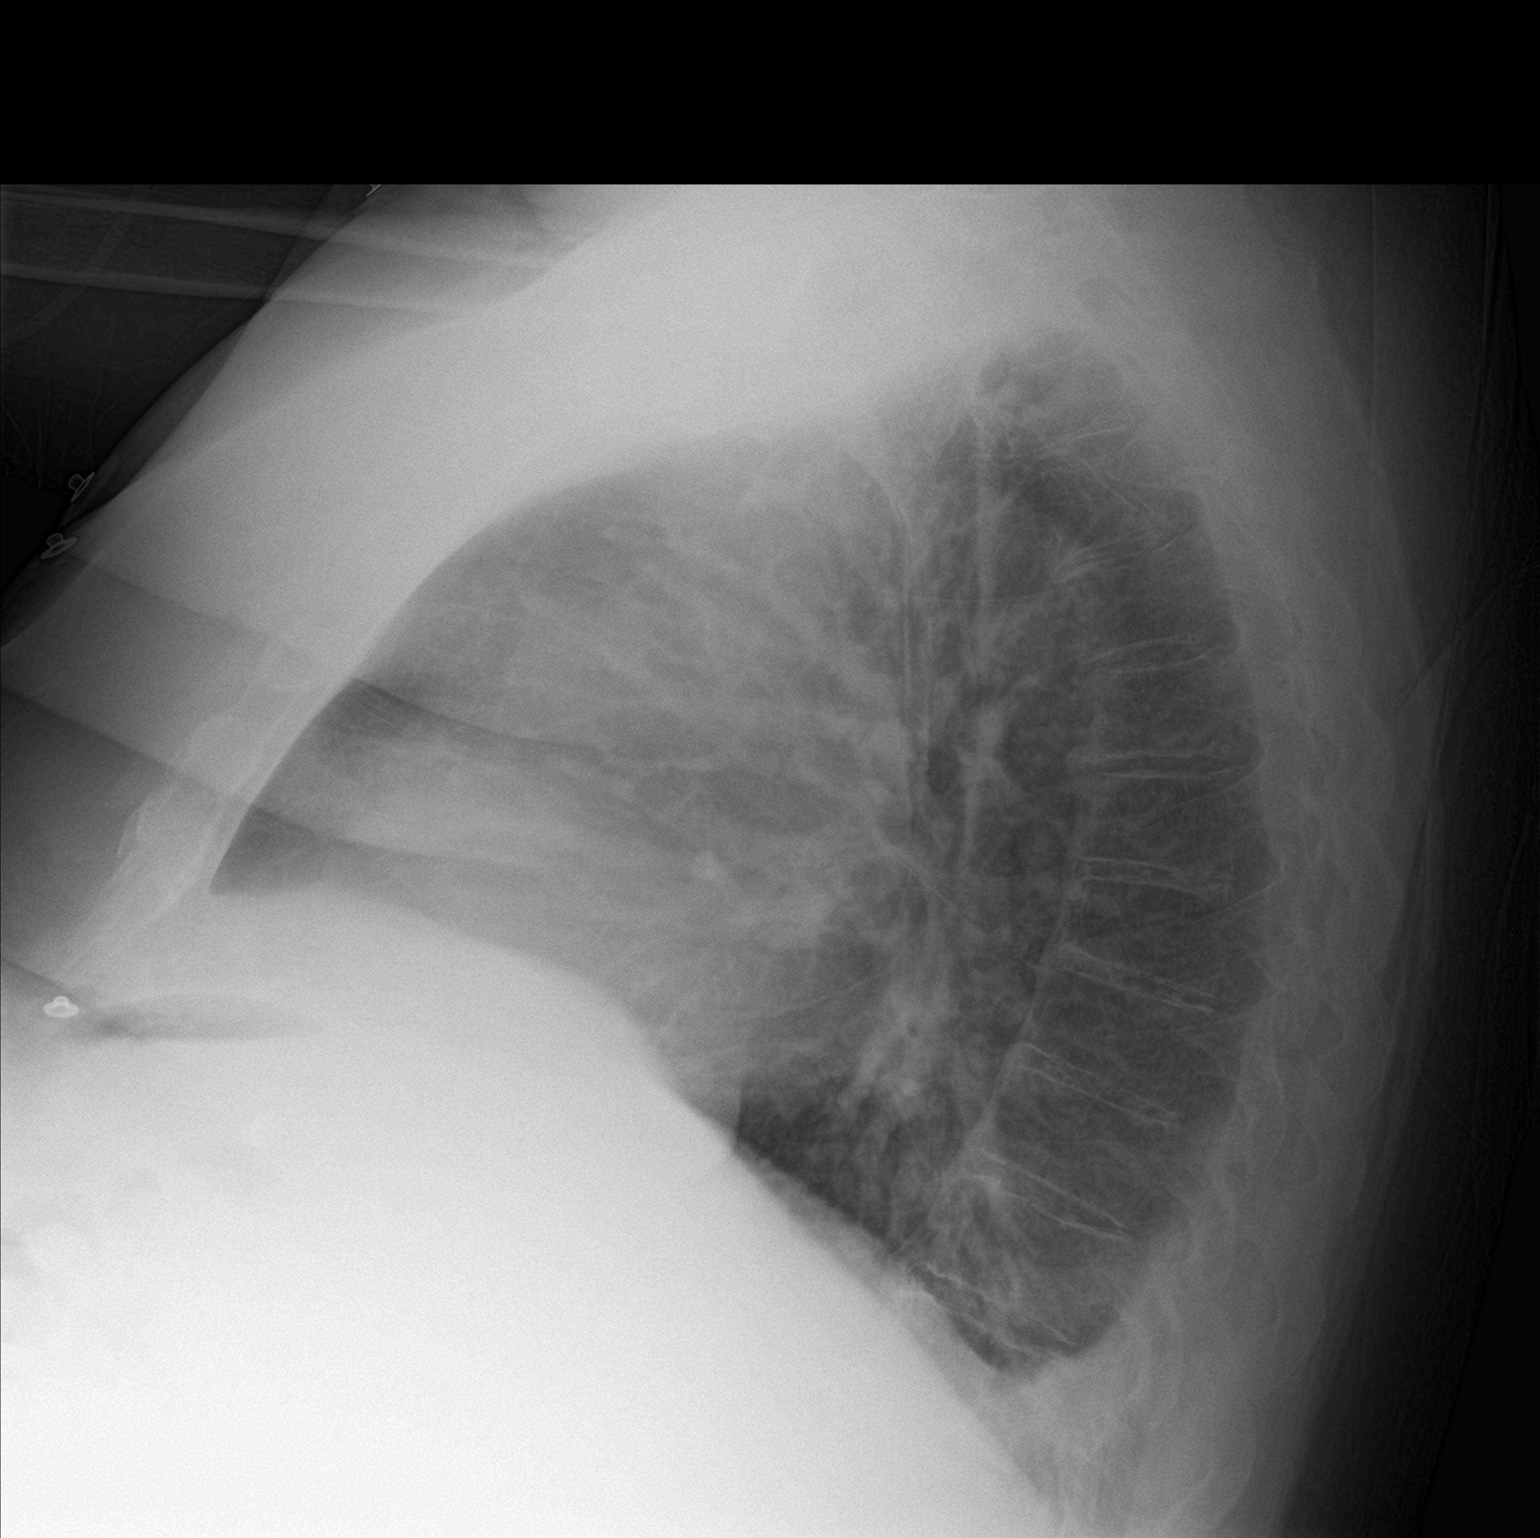

[chest ap]
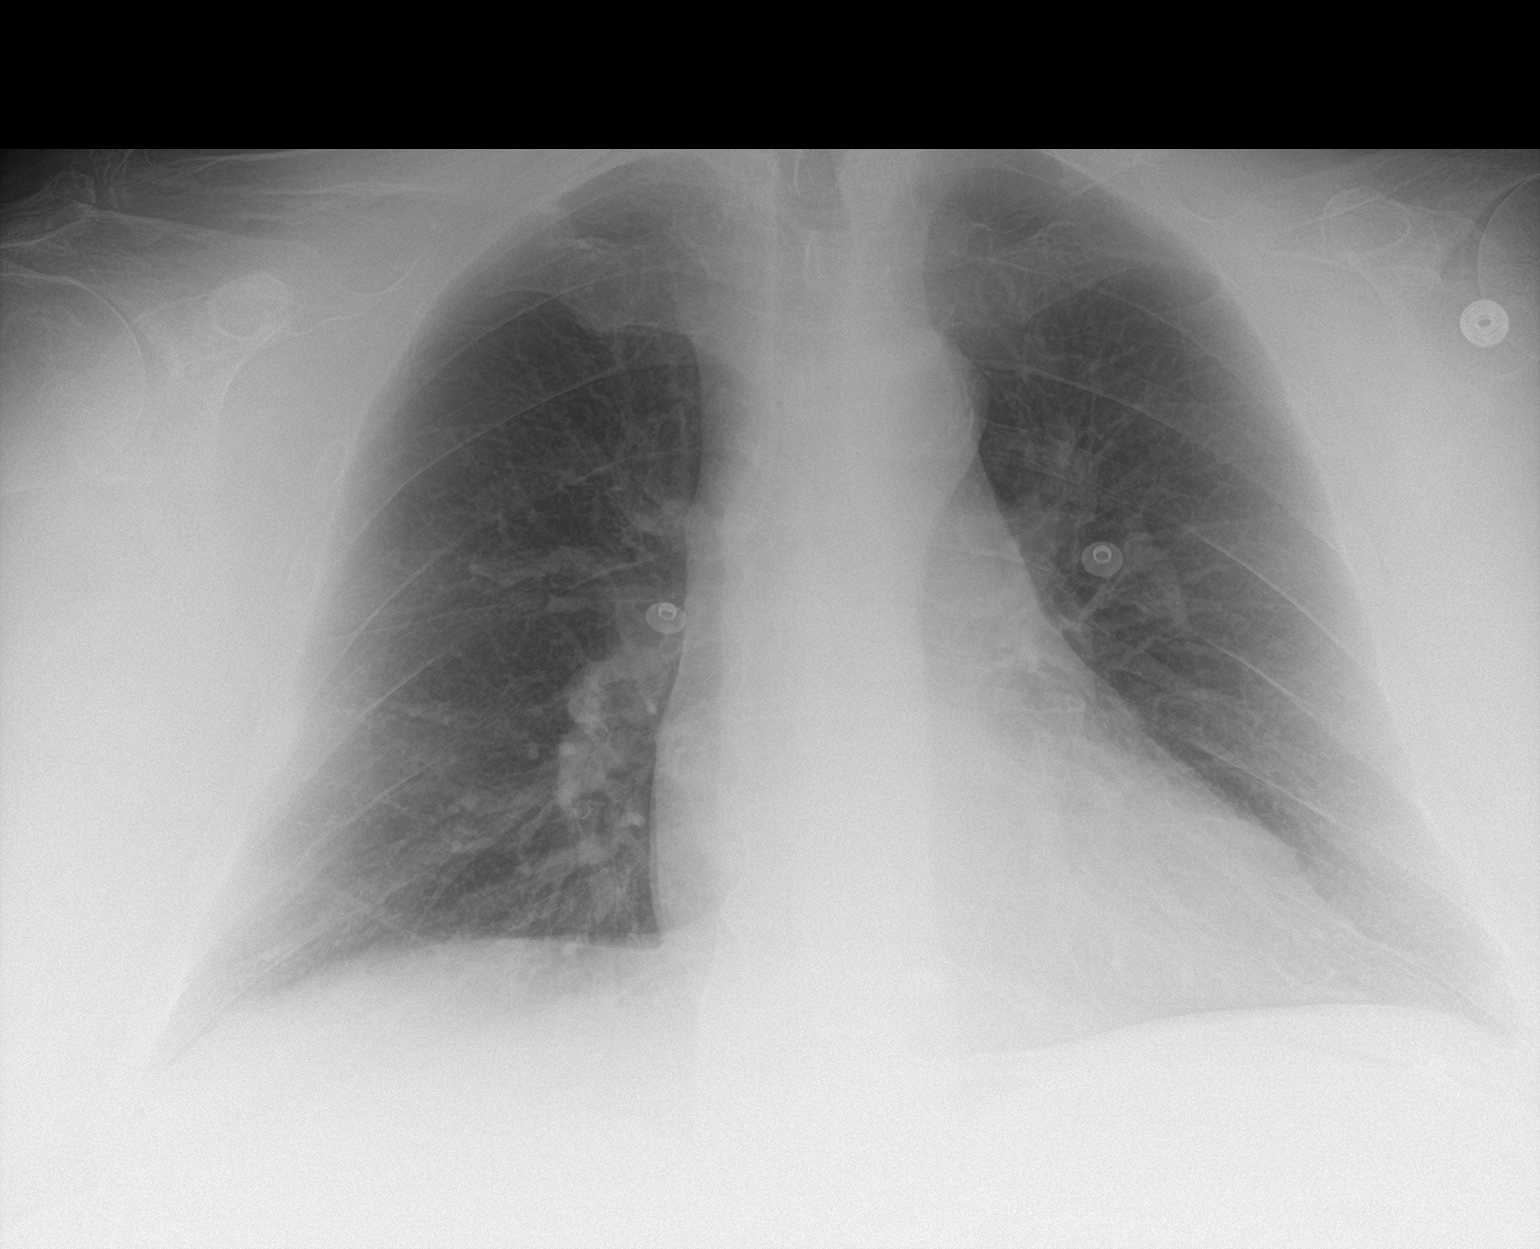

[2 of 2 positions shown; findings below may reference images not displayed]

FINDINGS: The lungs are clear. Very small pleural effusion, probably on the
left. No airspace consolidation. Normal pulmonary vasculature. Hilar
and mediastinal contours are normal and unchanged.
IMPRESSION: Small left pleural effusion.  No airspace consolidation.

## 2017-11-15 ENCOUNTER — Ambulatory Visit: Payer: Medicare Other | Admitting: Internal Medicine

## 2017-12-19 ENCOUNTER — Ambulatory Visit (INDEPENDENT_AMBULATORY_CARE_PROVIDER_SITE_OTHER): Payer: Medicare Other | Admitting: Internal Medicine

## 2017-12-19 ENCOUNTER — Encounter: Payer: Self-pay | Admitting: Internal Medicine

## 2017-12-19 ENCOUNTER — Other Ambulatory Visit (INDEPENDENT_AMBULATORY_CARE_PROVIDER_SITE_OTHER): Payer: Medicare Other

## 2017-12-19 DIAGNOSIS — E1165 Type 2 diabetes mellitus with hyperglycemia: Secondary | ICD-10-CM

## 2017-12-19 DIAGNOSIS — I2510 Atherosclerotic heart disease of native coronary artery without angina pectoris: Secondary | ICD-10-CM | POA: Diagnosis not present

## 2017-12-19 DIAGNOSIS — IMO0002 Reserved for concepts with insufficient information to code with codable children: Secondary | ICD-10-CM

## 2017-12-19 DIAGNOSIS — I480 Paroxysmal atrial fibrillation: Secondary | ICD-10-CM

## 2017-12-19 DIAGNOSIS — E1149 Type 2 diabetes mellitus with other diabetic neurological complication: Secondary | ICD-10-CM | POA: Diagnosis not present

## 2017-12-19 DIAGNOSIS — Z8673 Personal history of transient ischemic attack (TIA), and cerebral infarction without residual deficits: Secondary | ICD-10-CM | POA: Diagnosis not present

## 2017-12-19 DIAGNOSIS — E11319 Type 2 diabetes mellitus with unspecified diabetic retinopathy without macular edema: Secondary | ICD-10-CM

## 2017-12-19 LAB — BASIC METABOLIC PANEL
BUN: 16 mg/dL (ref 6–23)
CHLORIDE: 98 meq/L (ref 96–112)
CO2: 33 meq/L — AB (ref 19–32)
CREATININE: 0.95 mg/dL (ref 0.40–1.50)
Calcium: 9.4 mg/dL (ref 8.4–10.5)
GFR: 81.86 mL/min (ref >60.00)
Glucose, Bld: 63 mg/dL — ABNORMAL LOW (ref 70–99)
Potassium: 4.9 mEq/L (ref 3.5–5.1)
Sodium: 137 mEq/L (ref 135–145)

## 2017-12-19 LAB — HEMOGLOBIN A1C: Hgb A1c MFr Bld: 7.7 % — ABNORMAL HIGH (ref 4.6–6.5)

## 2017-12-19 MED ORDER — NITROGLYCERIN 0.4 MG SL SUBL: 0.4000 mg | SUBLINGUAL_TABLET | SUBLINGUAL | 3 refills | Status: DC | PRN

## 2017-12-19 MED ORDER — PRAVASTATIN SODIUM 40 MG PO TABS: 40.0000 mg | ORAL_TABLET | Freq: Every day | ORAL | 3 refills | Status: DC

## 2017-12-19 NOTE — Assessment & Plan Note (Signed)
Labs

## 2017-12-19 NOTE — Assessment & Plan Note (Signed)
NTG renewed.

## 2017-12-19 NOTE — Assessment & Plan Note (Signed)
Lopressor, Plavix, ASA Pt lost wt on a low carb diet

## 2017-12-19 NOTE — Progress Notes (Signed)
Subjective:  Patient ID: Charles [name], male    DOB: 1941/09/24  Age: 77 y.o. MRN: [id number]  CC: No chief complaint on file.   HPI Charles [name] presents for DM, HTN, CAD f/u  Outpatient Medications Prior to Visit  Medication Sig Dispense Refill  . acetaminophen (TYLENOL) 500 MG tablet Take 1,000 mg by mouth at bedtime.     Marland Kitchen aspirin EC 81 MG tablet Take 81 mg by mouth daily.     . cholecalciferol (VITAMIN D) 1000 units tablet Take 1,000 Units by mouth daily.     . clopidogrel (PLAVIX) 75 MG tablet Take 1 tablet (75 mg total) by mouth daily. 90 tablet 3  . clotrimazole-betamethasone (LOTRISONE) cream Apply 1 application topically 2 (two) times daily. 45 g 1  . collagenase (SANTYL) ointment Apply 1 application topically daily. 15 g 3  . Fluticasone-Salmeterol (ADVAIR DISKUS) 100-50 MCG/DOSE AEPB Inhale 1 puff into the lungs 2 (two) times daily. 3 each 3  . furosemide (LASIX) 80 MG tablet Take 1/2 tablet daily alternating with whole tablet daily (Patient taking differently: Take 40-80 mg by mouth See admin instructions. Take 1/2 tablet daily alternating with whole tablet daily) 90 tablet 3  . gabapentin (NEURONTIN) 300 MG capsule Take 1 capsule (300 mg total) by mouth 3 (three) times daily. 270 capsule 3  . insulin aspart (NOVOLOG) 100 UNIT/ML FlexPen Inject 30 Units into the skin 4 (four) times daily - after meals and at bedtime. Follow SS 15 pen 3  . isosorbide mononitrate (IMDUR) 60 MG 24 hr tablet Take 1 tablet (60 mg total) by mouth daily. 90 tablet 3  . metFORMIN (GLUCOPHAGE) 500 MG tablet Take 1 tablet (500 mg total) by mouth 2 (two) times daily with a meal. 180 tablet 3  . metoprolol tartrate (LOPRESSOR) 25 MG tablet Take 1 tablet (25 mg total) by mouth 2 (two) times daily. 180 tablet 3  . nitroGLYCERIN (NITROSTAT) 0.4 MG SL tablet Place 1 tablet (0.4 mg total) under the tongue every 5 (five) minutes as needed (up to 3 doses). For chest pain 25 tablet 3  . pioglitazone (ACTOS) 45  MG tablet Take 1 tablet (45 mg total) by mouth daily. 90 tablet 3  . potassium chloride SA (K-DUR,KLOR-CON) 20 MEQ tablet Take 1 tablet (20 mEq total) by mouth daily. 90 tablet 3  . pravastatin (PRAVACHOL) 20 MG tablet Take 1 tablet (20 mg total) by mouth daily. 90 tablet 3  . ranolazine (RANEXA) 500 MG 12 hr tablet Take 1 tablet (500 mg total) by mouth 2 (two) times daily. 180 tablet 3  . triamcinolone cream (KENALOG) 0.5 % Apply 1 application topically 2 (two) times daily as needed (rash). 30 g 3  . vitamin B-12 (CYANOCOBALAMIN) 1000 MCG tablet Take 1,000 mcg by mouth daily.      No facility-administered medications prior to visit.     ROS Review of Systems  Constitutional: Negative for appetite change, fatigue and unexpected weight change.  HENT: Negative for congestion, nosebleeds, sneezing, sore throat and trouble swallowing.   Eyes: Negative for itching and visual disturbance.  Respiratory: Negative for cough.   Cardiovascular: Negative for chest pain, palpitations and leg swelling.  Gastrointestinal: Negative for abdominal distention, blood in stool, diarrhea and nausea.  Genitourinary: Negative for frequency and hematuria.  Musculoskeletal: Positive for arthralgias, back pain and gait problem. Negative for joint swelling and neck pain.  Skin: Negative for rash.  Neurological: Negative for dizziness, tremors, speech difficulty and weakness.  Psychiatric/Behavioral: Negative for agitation, dysphoric mood and sleep disturbance. The patient is not nervous/anxious.     Objective:  BP 116/68 (BP Location: Left Arm, Patient Position: Sitting, Cuff Size: Large)   Pulse 70   Temp 98.2 F (36.8 C) (Oral)   Ht 6\' 4"  (1.93 m)   Wt (!) 333 lb (151 kg)   SpO2 99%   BMI 40.53 kg/m   BP Readings from Last 3 Encounters:  12/19/17 116/68  08/16/17 118/64  06/02/17 121/61    Wt Readings from Last 3 Encounters:  12/19/17 (!) 333 lb (151 kg)  08/16/17 (!) 340 lb (154.2 kg)  06/01/17  (!) 366 lb 6.4 oz (166.2 kg)    Physical Exam  Constitutional: He is oriented to person, place, and time. He appears well-developed. No distress.  NAD  HENT:  Mouth/Throat: Oropharynx is clear and moist.  Eyes: Conjunctivae are normal. Pupils are equal, round, and reactive to light.  Neck: Normal range of motion. No JVD present. No thyromegaly present.  Cardiovascular: Normal rate, regular rhythm, normal heart sounds and intact distal pulses. Exam reveals no gallop and no friction rub.  No murmur heard. Pulmonary/Chest: Effort normal and breath sounds normal. No respiratory distress. He has no wheezes. He has no rales. He exhibits no tenderness.  Abdominal: Soft. Bowel sounds are normal. He exhibits no distension and no mass. There is no tenderness. There is no rebound and no guarding.  Musculoskeletal: Normal range of motion. He exhibits tenderness. He exhibits no edema.  Lymphadenopathy:    He has no cervical adenopathy.  Neurological: He is alert and oriented to person, place, and time. He has normal reflexes. No cranial nerve deficit. He exhibits normal muscle tone. He displays a negative Romberg sign. Coordination abnormal. Gait normal.  Skin: Skin is warm and dry. No rash noted.  Psychiatric: He has a normal mood and affect. His behavior is normal. Judgment and thought content normal.  walker Hard hearing  Lab Results  Component Value Date   WBC 5.2 06/02/2017   HGB 9.7 (L) 06/02/2017   HCT 31.2 (L) 06/02/2017   PLT 260 06/02/2017   GLUCOSE 164 (H) 08/16/2017   CHOL 131 06/01/2017   TRIG 31 06/01/2017   HDL 45 06/01/2017   LDLCALC 80 06/01/2017   ALT 7 03/10/2017   AST 15 03/10/2017   NA 136 08/16/2017   K 4.5 08/16/2017   CL 101 08/16/2017   CREATININE 0.83 08/16/2017   BUN 16 08/16/2017   CO2 28 08/16/2017   TSH 2.584 06/01/2017   PSA 0.96 08/28/2010   INR 1.06 06/01/2017   HGBA1C 7.3 (H) 08/16/2017   MICROALBUR 1.4 08/28/2010    Dg Chest 2 View  Result  Date: 06/01/2017 CLINICAL DATA:  Acute onset of generalized chest pain and shortness of breath. Initial encounter. EXAM: CHEST  2 VIEW COMPARISON:  Chest radiograph performed 01/10/2017 FINDINGS: The lungs are well-aerated. Minimal bibasilar atelectasis is noted. There is no evidence of pleural effusion or pneumothorax. The heart is borderline normal in size. No acute osseous abnormalities are seen. IMPRESSION: Minimal bibasilar atelectasis noted.  Lungs otherwise clear. Electronically Signed   By: Garald Balding M.D.   On: 06/01/2017 01:55    Assessment & Plan:   There are no diagnoses linked to this encounter. I am having Barnabas A. Grieger maintain his vitamin B-12, triamcinolone cream, Fluticasone-Salmeterol, aspirin EC, collagenase, cholecalciferol, acetaminophen, clotrimazole-betamethasone, gabapentin, insulin aspart, clopidogrel, furosemide, metFORMIN, nitroGLYCERIN, pioglitazone, potassium chloride SA, isosorbide mononitrate, pravastatin, metoprolol  tartrate, and ranolazine.  No orders of the defined types were placed in this encounter.    Follow-up: No Follow-up on file.  [name], MD

## 2017-12-19 NOTE — Assessment & Plan Note (Signed)
On ASA, Plavix 

## 2018-04-18 ENCOUNTER — Encounter: Payer: Self-pay | Admitting: Internal Medicine

## 2018-04-18 ENCOUNTER — Ambulatory Visit (INDEPENDENT_AMBULATORY_CARE_PROVIDER_SITE_OTHER): Payer: Medicare Other | Admitting: Internal Medicine

## 2018-04-18 ENCOUNTER — Other Ambulatory Visit (INDEPENDENT_AMBULATORY_CARE_PROVIDER_SITE_OTHER): Payer: Medicare Other

## 2018-04-18 DIAGNOSIS — E538 Deficiency of other specified B group vitamins: Secondary | ICD-10-CM

## 2018-04-18 DIAGNOSIS — E1165 Type 2 diabetes mellitus with hyperglycemia: Secondary | ICD-10-CM

## 2018-04-18 DIAGNOSIS — I480 Paroxysmal atrial fibrillation: Secondary | ICD-10-CM

## 2018-04-18 DIAGNOSIS — I5033 Acute on chronic diastolic (congestive) heart failure: Secondary | ICD-10-CM

## 2018-04-18 DIAGNOSIS — IMO0002 Reserved for concepts with insufficient information to code with codable children: Secondary | ICD-10-CM

## 2018-04-18 DIAGNOSIS — E11319 Type 2 diabetes mellitus with unspecified diabetic retinopathy without macular edema: Secondary | ICD-10-CM | POA: Diagnosis not present

## 2018-04-18 DIAGNOSIS — I2510 Atherosclerotic heart disease of native coronary artery without angina pectoris: Secondary | ICD-10-CM

## 2018-04-18 DIAGNOSIS — E11649 Type 2 diabetes mellitus with hypoglycemia without coma: Secondary | ICD-10-CM

## 2018-04-18 LAB — BASIC METABOLIC PANEL
BUN: 25 mg/dL — AB (ref 6–23)
CO2: 32 mEq/L (ref 19–32)
Calcium: 9.6 mg/dL (ref 8.4–10.5)
Chloride: 99 mEq/L (ref 96–112)
Creatinine, Ser: 0.96 mg/dL (ref 0.40–1.50)
GFR: 80.8 mL/min (ref >60.00)
Glucose, Bld: 70 mg/dL (ref 70–99)
POTASSIUM: 4.6 meq/L (ref 3.5–5.1)
SODIUM: 138 meq/L (ref 135–145)

## 2018-04-18 LAB — HEMOGLOBIN A1C: Hgb A1c MFr Bld: 8.1 % — ABNORMAL HIGH (ref 4.6–6.5)

## 2018-04-18 NOTE — Assessment & Plan Note (Signed)
Labs

## 2018-04-18 NOTE — Assessment & Plan Note (Signed)
Losartan, Lopressor, Lasix, Pravachol, Plavix, ASA, Isosorbide, Ranexa Pt lost wt on a low carb diet

## 2018-04-18 NOTE — Assessment & Plan Note (Signed)
Lopressor, Plavix, ASA 

## 2018-04-18 NOTE — Assessment & Plan Note (Signed)
On B12 

## 2018-04-18 NOTE — Progress Notes (Signed)
Subjective:  Patient ID: Charles [name], male    DOB: Apr 25, 1941  Age: 77 y.o. MRN: [id number]  CC: No chief complaint on file.   HPI Charles [name] presents for a DM, CHF, HTN f/u Lost 33 lbs in 1 year  Outpatient Medications Prior to Visit  Medication Sig Dispense Refill  . acetaminophen (TYLENOL) 500 MG tablet Take 1,000 mg by mouth at bedtime.     Marland Kitchen aspirin EC 81 MG tablet Take 81 mg by mouth daily.     . cholecalciferol (VITAMIN D) 1000 units tablet Take 1,000 Units by mouth daily.     . clopidogrel (PLAVIX) 75 MG tablet Take 1 tablet (75 mg total) by mouth daily. 90 tablet 3  . collagenase (SANTYL) ointment Apply 1 application topically daily. 15 g 3  . Fluticasone-Salmeterol (ADVAIR DISKUS) 100-50 MCG/DOSE AEPB Inhale 1 puff into the lungs 2 (two) times daily. 3 each 3  . furosemide (LASIX) 80 MG tablet Take 1/2 tablet daily alternating with whole tablet daily (Patient taking differently: Take 40-80 mg by mouth See admin instructions. Take 1/2 tablet daily alternating with whole tablet daily) 90 tablet 3  . gabapentin (NEURONTIN) 300 MG capsule Take 1 capsule (300 mg total) by mouth 3 (three) times daily. 270 capsule 3  . insulin aspart (NOVOLOG) 100 UNIT/ML FlexPen Inject 30 Units into the skin 4 (four) times daily - after meals and at bedtime. Follow SS 15 pen 3  . isosorbide mononitrate (IMDUR) 60 MG 24 hr tablet Take 1 tablet (60 mg total) by mouth daily. 90 tablet 3  . metFORMIN (GLUCOPHAGE) 500 MG tablet Take 1 tablet (500 mg total) by mouth 2 (two) times daily with a meal. 180 tablet 3  . metoprolol tartrate (LOPRESSOR) 25 MG tablet Take 1 tablet (25 mg total) by mouth 2 (two) times daily. 180 tablet 3  . nitroGLYCERIN (NITROSTAT) 0.4 MG SL tablet Place 1 tablet (0.4 mg total) under the tongue every 5 (five) minutes as needed (up to 3 doses). For chest pain 60 tablet 3  . pioglitazone (ACTOS) 45 MG tablet Take 1 tablet (45 mg total) by mouth daily. 90 tablet 3  . potassium  chloride SA (K-DUR,KLOR-CON) 20 MEQ tablet Take 1 tablet (20 mEq total) by mouth daily. 90 tablet 3  . pravastatin (PRAVACHOL) 40 MG tablet Take 1 tablet (40 mg total) by mouth daily. 90 tablet 3  . ranolazine (RANEXA) 500 MG 12 hr tablet Take 1 tablet (500 mg total) by mouth 2 (two) times daily. 180 tablet 3  . triamcinolone cream (KENALOG) 0.5 % Apply 1 application topically 2 (two) times daily as needed (rash). 30 g 3  . vitamin B-12 (CYANOCOBALAMIN) 1000 MCG tablet Take 1,000 mcg by mouth daily.      No facility-administered medications prior to visit.     ROS Review of Systems  Constitutional: Positive for fatigue. Negative for appetite change and unexpected weight change.  HENT: Positive for hearing loss. Negative for congestion, nosebleeds, sneezing, sore throat and trouble swallowing.   Eyes: Negative for itching and visual disturbance.  Respiratory: Negative for cough.   Cardiovascular: Negative for chest pain, palpitations and leg swelling.  Gastrointestinal: Negative for abdominal distention, blood in stool, diarrhea and nausea.  Genitourinary: Negative for frequency and hematuria.  Musculoskeletal: Positive for gait problem. Negative for back pain, joint swelling and neck pain.  Skin: Negative for rash.  Neurological: Positive for weakness. Negative for dizziness, tremors and speech difficulty.  Psychiatric/Behavioral: Negative for  agitation, dysphoric mood and sleep disturbance. The patient is not nervous/anxious.     Objective:  There were no vitals taken for this visit.  BP Readings from Last 3 Encounters:  12/19/17 116/68  08/16/17 118/64  06/02/17 121/61    Wt Readings from Last 3 Encounters:  12/19/17 (!) 333 lb (151 kg)  08/16/17 (!) 340 lb (154.2 kg)  06/01/17 (!) 366 lb 6.4 oz (166.2 kg)    Physical Exam  Constitutional: He is oriented to person, place, and time. He appears well-developed. No distress.  NAD  HENT:  Mouth/Throat: Oropharynx is clear and  moist.  Eyes: Pupils are equal, round, and reactive to light. Conjunctivae are normal.  Neck: Normal range of motion. No JVD present. No thyromegaly present.  Cardiovascular: Normal rate, regular rhythm, normal heart sounds and intact distal pulses. Exam reveals no gallop and no friction rub.  No murmur heard. Pulmonary/Chest: Effort normal and breath sounds normal. No respiratory distress. He has no wheezes. He has no rales. He exhibits no tenderness.  Abdominal: Soft. Bowel sounds are normal. He exhibits no distension and no mass. There is no tenderness. There is no rebound and no guarding.  Musculoskeletal: Normal range of motion. He exhibits no edema or tenderness.  Lymphadenopathy:    He has no cervical adenopathy.  Neurological: He is alert and oriented to person, place, and time. He has normal reflexes. No cranial nerve deficit. He exhibits normal muscle tone. He displays a negative Romberg sign. Coordination abnormal. Gait normal.  Skin: Skin is warm and dry. No rash noted.  Psychiatric: He has a normal mood and affect. His behavior is normal. Judgment and thought content normal.   Obese Walker Bruises Hard hearing Edema 1+ B   Lab Results  Component Value Date   WBC 5.2 06/02/2017   HGB 9.7 (L) 06/02/2017   HCT 31.2 (L) 06/02/2017   PLT 260 06/02/2017   GLUCOSE 63 (L) 12/19/2017   CHOL 131 06/01/2017   TRIG 31 06/01/2017   HDL 45 06/01/2017   LDLCALC 80 06/01/2017   ALT 7 03/10/2017   AST 15 03/10/2017   NA 137 12/19/2017   K 4.9 12/19/2017   CL 98 12/19/2017   CREATININE 0.95 12/19/2017   BUN 16 12/19/2017   CO2 33 (H) 12/19/2017   TSH 2.584 06/01/2017   PSA 0.96 08/28/2010   INR 1.06 06/01/2017   HGBA1C 7.7 (H) 12/19/2017   MICROALBUR 1.4 08/28/2010    Dg Chest 2 View  Result Date: 06/01/2017 CLINICAL DATA:  Acute onset of generalized chest pain and shortness of breath. Initial encounter. EXAM: CHEST  2 VIEW COMPARISON:  Chest radiograph performed  01/10/2017 FINDINGS: The lungs are well-aerated. Minimal bibasilar atelectasis is noted. There is no evidence of pleural effusion or pneumothorax. The heart is borderline normal in size. No acute osseous abnormalities are seen. IMPRESSION: Minimal bibasilar atelectasis noted.  Lungs otherwise clear. Electronically Signed   By: Garald Balding M.D.   On: 06/01/2017 01:55    Assessment & Plan:   There are no diagnoses linked to this encounter. I am having Madeline A. Dombeck maintain his vitamin B-12, triamcinolone cream, Fluticasone-Salmeterol, aspirin EC, collagenase, cholecalciferol, acetaminophen, gabapentin, insulin aspart, clopidogrel, furosemide, metFORMIN, pioglitazone, potassium chloride SA, isosorbide mononitrate, metoprolol tartrate, ranolazine, pravastatin, and nitroGLYCERIN.  No orders of the defined types were placed in this encounter.    Follow-up: No follow-ups on file.  [name], MD

## 2018-04-18 NOTE — Assessment & Plan Note (Signed)
Losartan, Lopressor, Lasix, Plavix, ASA, Isosorbide Pt lost wt on a low carb diet

## 2018-04-18 NOTE — Assessment & Plan Note (Signed)
Discussed insulin Rx

## 2018-04-18 NOTE — Assessment & Plan Note (Signed)
Better Wt Readings from Last 3 Encounters:  04/18/18 (!) 319 lb (144.7 kg)  12/19/17 (!) 333 lb (151 kg)  08/16/17 (!) 340 lb (154.2 kg)

## 2018-04-18 NOTE — Patient Instructions (Signed)
[facility] well w/Charles [name] 

## 2018-05-04 ENCOUNTER — Other Ambulatory Visit: Payer: Self-pay | Admitting: Internal Medicine

## 2018-07-07 ENCOUNTER — Ambulatory Visit (INDEPENDENT_AMBULATORY_CARE_PROVIDER_SITE_OTHER): Payer: Medicare Other | Admitting: Podiatry

## 2018-07-07 ENCOUNTER — Ambulatory Visit (INDEPENDENT_AMBULATORY_CARE_PROVIDER_SITE_OTHER): Payer: Medicare Other

## 2018-07-07 DIAGNOSIS — G629 Polyneuropathy, unspecified: Secondary | ICD-10-CM | POA: Diagnosis not present

## 2018-07-07 DIAGNOSIS — L97522 Non-pressure chronic ulcer of other part of left foot with fat layer exposed: Secondary | ICD-10-CM

## 2018-07-07 DIAGNOSIS — I2510 Atherosclerotic heart disease of native coronary artery without angina pectoris: Secondary | ICD-10-CM

## 2018-07-07 DIAGNOSIS — L03032 Cellulitis of left toe: Secondary | ICD-10-CM

## 2018-07-07 MED ORDER — AMOXICILLIN-POT CLAVULANATE 875-125 MG PO TABS: 1.0000 | ORAL_TABLET | Freq: Two times a day (BID) | ORAL | 0 refills | Status: DC

## 2018-07-11 NOTE — Progress Notes (Signed)
Subjective: 77 year old male presents the office today with his wife for concerns of chronic ulcerations of the left foot.  He is done well from the right foot and the last saw him.  On the left foot the patient's wife states that she gets a scab along the wounds and they open up and she cannot scab off.  She does perform daily dressing changes.  Denies any pus coming from the area but she has noticed some redness to the toes. Denies any systemic complaints such as fevers, chills, nausea, vomiting. No acute changes since last appointment, and no other complaints at this time.   Objective: AAO x3, NAD DP/PT pulses palpable bilaterally, CRT less than 3 seconds Incision site is well-healed on the right side.  On the left foot submetatarsal 1 there is a hyperkeratotic lesion with a granular wound about 0.6 x 0.6 cm.  There is no probing to bone, undermining or tunneling.  There is no significant swelling erythema there is no ascending cellulitis this area.  However there is an ulceration also to the distal portion left third toe and there is edema erythema to this digit.  There is no probing to bone, undermining or tunneling.  No fluctuation or crepitation. No open lesions or pre-ulcerative lesions.  No pain with calf compression, swelling, warmth, erythema    Assessment: Chronic ulcerations left foot with cellulitis  Plan: -All treatment options discussed with the patient including all alternatives, risks, complications.  -X-rays were obtained and reviewed.  No soft tissue emphysema and there is no definitive cortical destruction suggestive of osteomyelitis. -The wounds were sharply debrided x2 without any complications utilizing #867 blade scalpel down to healthy, granular tissue to remove all nonviable tissue.  Continue daily dressing changes. -Prescribed Augmentin -Recommend offloading shoe.  He has at home. -Patient encouraged to call the office with any questions, concerns, change in symptoms.    Trula Slade DPM

## 2018-07-14 ENCOUNTER — Ambulatory Visit (INDEPENDENT_AMBULATORY_CARE_PROVIDER_SITE_OTHER): Payer: Medicare Other | Admitting: Podiatry

## 2018-07-14 DIAGNOSIS — L03032 Cellulitis of left toe: Secondary | ICD-10-CM

## 2018-07-14 DIAGNOSIS — I2510 Atherosclerotic heart disease of native coronary artery without angina pectoris: Secondary | ICD-10-CM

## 2018-07-14 DIAGNOSIS — L97522 Non-pressure chronic ulcer of other part of left foot with fat layer exposed: Secondary | ICD-10-CM | POA: Diagnosis not present

## 2018-07-18 NOTE — Progress Notes (Signed)
Subjective: 77 year old male presents the office with his wife for follow-up evaluation of ulcerations to his left foot.  He is still on antibiotics and they state that the swelling and the redness is much improved.  They have been doing daily dressing changes.  Denies any pus coming from the area and overall he is doing better. Denies any systemic complaints such as fevers, chills, nausea, vomiting. No acute changes since last appointment, and no other complaints at this time.   Objective: AAO x3, NAD DP/PT pulses decreased bilaterally- this is unchanged Sensation decreased with Derrel Nip monofilament On the left foot submetatarsal 1 is hyperkeratotic lesion with central ulceration which measures 0.8 x 0.4 cm.  On the distal aspect the left third toe has an ulceration which measures 0.3 x 0.2.  There is no probing to bone to either of these wounds and there is no drainage or pus identified today.  There is decreased erythema mostly to the third toe cellulitis.  No malodor. No open lesions or pre-ulcerative lesions.  No pain with calf compression, swelling, warmth, erythema  Assessment: Chronic ulcerations left foot with decreased erythema  Plan: -All treatment options discussed with the patient including all alternatives, risks, complications.  -I sharply debrided the wound sooner any complications or bleeding to remove all nonviable, hyperkeratotic tissue utilizing #312 blade scalpel.  Continue with daily dressing changes with a switch to Medihoney.  I did give them some of this today.  Continue offloading at all times.  Finish course of antibiotics.  Once he finishes this we will hold off any further antibiotics at this point. Monitor for any clinical signs or symptoms of infection and directed to call the office immediately should any occur or go to the ER. -Patient encouraged to call the office with any questions, concerns, change in symptoms.   Trula Slade DPM

## 2018-07-25 ENCOUNTER — Ambulatory Visit: Payer: Medicare Other | Admitting: Orthotics

## 2018-07-28 ENCOUNTER — Ambulatory Visit (INDEPENDENT_AMBULATORY_CARE_PROVIDER_SITE_OTHER): Payer: Medicare Other | Admitting: Podiatry

## 2018-07-28 DIAGNOSIS — L97522 Non-pressure chronic ulcer of other part of left foot with fat layer exposed: Secondary | ICD-10-CM | POA: Diagnosis not present

## 2018-08-01 ENCOUNTER — Other Ambulatory Visit: Payer: Self-pay | Admitting: Internal Medicine

## 2018-08-01 DIAGNOSIS — R0602 Shortness of breath: Secondary | ICD-10-CM

## 2018-08-01 DIAGNOSIS — I2510 Atherosclerotic heart disease of native coronary artery without angina pectoris: Secondary | ICD-10-CM

## 2018-08-01 NOTE — Progress Notes (Signed)
Subjective: 77 year old male presents the office with his wife for follow-up evaluation of ulcerations to his left foot.  He presents today with his wife he states that the wounds are getting better.  Denies any drainage or pus.  They have been putting Medihoney on the wounds daily.  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  He has no other concerns today. Denies any systemic complaints such as fevers, chills, nausea, vomiting. No acute changes since last appointment, and no other complaints at this time.   Objective: AAO x3, NAD DP/PT pulses decreased bilaterally- this is unchanged Sensation decreased with Derrel Nip monofilament On the left foot submetatarsal 1 is hyperkeratotic lesion with central ulceration which measures 0.5 x 0.5 cm.  On the distal aspect the left third toe has an ulceration which measures 0.2 x 0.1.  There is no probing to bone to either of these wounds and there is no drainage or pus identified today.  There is decreased erythema mostly to the third toe cellulitis.  No malodor. No open lesions or pre-ulcerative lesions.  No pain with calf compression, swelling, warmth, erythema  Assessment: Chronic ulcerations left foot with decreased erythema  Plan: -All treatment options discussed with the patient including all alternatives, risks, complications.  -I sharply debrided the wound sooner any complications or bleeding to remove all nonviable, hyperkeratotic tissue utilizing #312 blade scalpel.  Continue with daily dressing changes with a switch to Medihoney.  I did give them some of this today.  Continue offloading at all times.  Finish course of antibiotics.  Once he finishes this we will hold off any further antibiotics at this point. Monitor for any clinical signs or symptoms of infection and directed to call the office immediately should any occur or go to the ER. -Patient encouraged to call the office with any questions, concerns, change in symptoms.    Trula Slade DPM

## 2018-08-04 MED ORDER — METOPROLOL TARTRATE 25 MG PO TABS: 25.0000 mg | ORAL_TABLET | Freq: Two times a day (BID) | ORAL | 3 refills | Status: DC

## 2018-08-04 MED ORDER — GABAPENTIN 300 MG PO CAPS: ORAL_CAPSULE | ORAL | 3 refills | Status: DC

## 2018-08-04 MED ORDER — POTASSIUM CHLORIDE CRYS ER 20 MEQ PO TBCR: 20.0000 meq | EXTENDED_RELEASE_TABLET | Freq: Every day | ORAL | 3 refills | Status: DC

## 2018-08-04 MED ORDER — FUROSEMIDE 80 MG PO TABS: ORAL_TABLET | ORAL | 3 refills | Status: DC

## 2018-08-04 NOTE — Addendum Note (Signed)
Addended by: Karren Cobble on: 08/04/2018 08:59 AM   Modules accepted: Orders

## 2018-08-12 ENCOUNTER — Emergency Department (HOSPITAL_COMMUNITY): Payer: Medicare Other

## 2018-08-12 ENCOUNTER — Emergency Department (HOSPITAL_COMMUNITY)
Admission: EM | Admit: 2018-08-12 | Discharge: 2018-08-13 | Disposition: A | Discharge status: Home / Self Care | Payer: Medicare Other | Attending: Emergency Medicine | Admitting: Emergency Medicine

## 2018-08-12 ENCOUNTER — Encounter (HOSPITAL_COMMUNITY): Payer: Self-pay | Admitting: Emergency Medicine

## 2018-08-12 DIAGNOSIS — Z79899 Other long term (current) drug therapy: Secondary | ICD-10-CM | POA: Insufficient documentation

## 2018-08-12 DIAGNOSIS — I2510 Atherosclerotic heart disease of native coronary artery without angina pectoris: Secondary | ICD-10-CM | POA: Insufficient documentation

## 2018-08-12 DIAGNOSIS — E7800 Pure hypercholesterolemia, unspecified: Secondary | ICD-10-CM | POA: Insufficient documentation

## 2018-08-12 DIAGNOSIS — Y998 Other external cause status: Secondary | ICD-10-CM | POA: Insufficient documentation

## 2018-08-12 DIAGNOSIS — Z7982 Long term (current) use of aspirin: Secondary | ICD-10-CM | POA: Diagnosis not present

## 2018-08-12 DIAGNOSIS — I480 Paroxysmal atrial fibrillation: Secondary | ICD-10-CM | POA: Diagnosis not present

## 2018-08-12 DIAGNOSIS — J449 Chronic obstructive pulmonary disease, unspecified: Secondary | ICD-10-CM | POA: Diagnosis not present

## 2018-08-12 DIAGNOSIS — M25522 Pain in left elbow: Secondary | ICD-10-CM | POA: Diagnosis not present

## 2018-08-12 DIAGNOSIS — Y92009 Unspecified place in unspecified non-institutional (private) residence as the place of occurrence of the external cause: Secondary | ICD-10-CM | POA: Diagnosis not present

## 2018-08-12 DIAGNOSIS — Z8673 Personal history of transient ischemic attack (TIA), and cerebral infarction without residual deficits: Secondary | ICD-10-CM | POA: Diagnosis not present

## 2018-08-12 DIAGNOSIS — M25519 Pain in unspecified shoulder: Secondary | ICD-10-CM | POA: Diagnosis not present

## 2018-08-12 DIAGNOSIS — I252 Old myocardial infarction: Secondary | ICD-10-CM | POA: Diagnosis not present

## 2018-08-12 DIAGNOSIS — W19XXXA Unspecified fall, initial encounter: Secondary | ICD-10-CM | POA: Diagnosis not present

## 2018-08-12 DIAGNOSIS — Y939 Activity, unspecified: Secondary | ICD-10-CM | POA: Insufficient documentation

## 2018-08-12 DIAGNOSIS — I110 Hypertensive heart disease with heart failure: Secondary | ICD-10-CM | POA: Insufficient documentation

## 2018-08-12 DIAGNOSIS — R0902 Hypoxemia: Secondary | ICD-10-CM | POA: Diagnosis not present

## 2018-08-12 DIAGNOSIS — Z794 Long term (current) use of insulin: Secondary | ICD-10-CM | POA: Diagnosis not present

## 2018-08-12 DIAGNOSIS — R52 Pain, unspecified: Secondary | ICD-10-CM | POA: Diagnosis not present

## 2018-08-12 DIAGNOSIS — S4992XA Unspecified injury of left shoulder and upper arm, initial encounter: Secondary | ICD-10-CM | POA: Diagnosis present

## 2018-08-12 DIAGNOSIS — W010XXA Fall on same level from slipping, tripping and stumbling without subsequent striking against object, initial encounter: Secondary | ICD-10-CM | POA: Diagnosis not present

## 2018-08-12 DIAGNOSIS — Z87891 Personal history of nicotine dependence: Secondary | ICD-10-CM | POA: Insufficient documentation

## 2018-08-12 DIAGNOSIS — S42212A Unspecified displaced fracture of surgical neck of left humerus, initial encounter for closed fracture: Secondary | ICD-10-CM | POA: Insufficient documentation

## 2018-08-12 DIAGNOSIS — S42322A Displaced transverse fracture of shaft of humerus, left arm, initial encounter for closed fracture: Secondary | ICD-10-CM | POA: Diagnosis not present

## 2018-08-12 DIAGNOSIS — I5032 Chronic diastolic (congestive) heart failure: Secondary | ICD-10-CM | POA: Insufficient documentation

## 2018-08-12 DIAGNOSIS — Z7902 Long term (current) use of antithrombotics/antiplatelets: Secondary | ICD-10-CM | POA: Diagnosis not present

## 2018-08-12 DIAGNOSIS — E1140 Type 2 diabetes mellitus with diabetic neuropathy, unspecified: Secondary | ICD-10-CM | POA: Insufficient documentation

## 2018-08-12 DIAGNOSIS — I959 Hypotension, unspecified: Secondary | ICD-10-CM | POA: Diagnosis not present

## 2018-08-12 LAB — CBC WITH DIFFERENTIAL/PLATELET
Abs Immature Granulocytes: 0 10*3/uL (ref 0.0–0.1)
BASOS PCT: 1 %
Basophils Absolute: 0.1 10*3/uL (ref 0.0–0.1)
EOS ABS: 0.1 10*3/uL (ref 0.0–0.7)
Eosinophils Relative: 1 %
HCT: 37.4 % — ABNORMAL LOW (ref 39.0–52.0)
Hemoglobin: 11.8 g/dL — ABNORMAL LOW (ref 13.0–17.0)
Immature Granulocytes: 1 %
Lymphocytes Relative: 8 %
Lymphs Abs: 0.5 10*3/uL — ABNORMAL LOW (ref 0.7–4.0)
MCH: 31.2 pg (ref 26.0–34.0)
MCHC: 31.6 g/dL (ref 30.0–36.0)
MCV: 98.9 fL (ref 78.0–100.0)
MONO ABS: 0.7 10*3/uL (ref 0.1–1.0)
MONOS PCT: 10 %
NEUTROS PCT: 79 %
Neutro Abs: 5.3 10*3/uL (ref 1.7–7.7)
PLATELETS: 228 10*3/uL (ref 150–400)
RBC: 3.78 MIL/uL — ABNORMAL LOW (ref 4.22–5.81)
RDW: 15.9 % — AB (ref 11.5–15.5)
WBC: 6.6 10*3/uL (ref 4.0–10.5)

## 2018-08-12 LAB — BASIC METABOLIC PANEL
Anion gap: 10 (ref 5–15)
BUN: 27 mg/dL — ABNORMAL HIGH (ref 8–23)
CALCIUM: 9.2 mg/dL (ref 8.9–10.3)
CO2: 29 mmol/L (ref 22–32)
Chloride: 99 mmol/L (ref 98–111)
Creatinine, Ser: 0.98 mg/dL (ref 0.61–1.24)
GFR calc non Af Amer: >60 mL/min (ref >60)
GLUCOSE: 152 mg/dL — AB (ref 70–99)
Potassium: 4.1 mmol/L (ref 3.5–5.1)
Sodium: 138 mmol/L (ref 135–145)

## 2018-08-12 MED ORDER — ONDANSETRON HCL 4 MG/2ML IJ SOLN
4.0000 mg | Freq: Once | INTRAMUSCULAR | Status: AC
  Administered 2018-08-12: 4 mg via INTRAVENOUS
  Filled 2018-08-12: qty 2

## 2018-08-12 MED ORDER — HYDROCODONE-ACETAMINOPHEN 5-325 MG PO TABS: 1.0000 | ORAL_TABLET | Freq: Four times a day (QID) | ORAL | 0 refills | Status: DC | PRN

## 2018-08-12 MED ORDER — SODIUM CHLORIDE 0.9 % IV BOLUS
500.0000 mL | Freq: Once | INTRAVENOUS | Status: AC
  Administered 2018-08-12: 500 mL via INTRAVENOUS

## 2018-08-12 MED ORDER — MORPHINE SULFATE (PF) 4 MG/ML IV SOLN
4.0000 mg | Freq: Once | INTRAVENOUS | Status: AC
  Administered 2018-08-12: 4 mg via INTRAVENOUS
  Filled 2018-08-12: qty 1

## 2018-08-12 MED ORDER — FENTANYL CITRATE (PF) 100 MCG/2ML IJ SOLN
100.0000 ug | Freq: Once | INTRAMUSCULAR | Status: AC
  Administered 2018-08-12: 100 ug via INTRAVENOUS
  Filled 2018-08-12: qty 2

## 2018-08-12 NOTE — ED Triage Notes (Signed)
Brought by ems from home.  Fell while feeding his animals.  C/o pain in left shoulder.  Denies any head, neck or back pain.  Takes plavix.

## 2018-08-12 NOTE — ED Provider Notes (Signed)
[facility] EMERGENCY DEPARTMENT Provider Note   CSN: [id number] Arrival date & time: 08/12/18  2022     History   Chief Complaint Chief Complaint  Patient presents with  . Fall    HPI Charles [name] is a 77 y.o. male past medical history of arthritis, CAD, cataracts who presents for evaluation of left shoulder pain after mechanical fall that occurred just prior to ED arrival.  Patient reports he was at home and states that he was bending down to feed his animals when he states that he lost balance and fell backwards.  He states he did not hit his head.  He did not have any loss of consciousness.  He reports that his wife witnessed the fall who confirms no LOC and no head injury.  Patient reports that he experienced immediate pain to the left shoulder after the fall.  He states that he has not been able to move the arm secondary to pain.  States that the pain is in the shoulder and upper arm and elbow.  He states he is not having any numbness.  Patient reports he is currently on Plavix.  He denies any vision changes, neck pain, back pain, chest pain, difficulty breathing, nausea/vomiting, abdominal pain, numbness/weakness of his extremities.  The history is provided by the patient.    Past Medical History:  Diagnosis Date  . Arthritis   . B12 deficiency   . CAD (coronary artery disease)    a. Approx. 2000 - MI. Cath: single vsl dz, PTCA dLAD/medical rx;  b. NSTEMI 11/13 => IVUS attempted for RCA but not successful; anatomy felt stable from 2000 => med Rx. c. [place] 08/2016: severe stable diffuse dLAD, severe prox RCA -> PCI of RCA unsuccessful; d. 05/2017 NSTEMI/Cath: LM nl, LAD 20ost/p, 55m, 80d, LCX 30ost, 20p, 75m, OM2 90, RCA 80p, 99d.  . Cancer ([place])    skin cancer- head   . Cataracts   . Cholelithiasis   . Chronic diastolic CHF (congestive heart failure) ([place])    a. 05/2017 Echo: EF 60-65%, Gr2 DD, mildly dil Ao, triv MR, mildly to mod dil LA, mild PR.  .  Claustrophobia   . COPD (chronic obstructive pulmonary disease) ([place])   . CVA (cerebral vascular accident) ([place]) ~ 2001   vision inparted from stroke.  Visual Memory loss  . Diabetic neuropathy ([place])   . Diverticulitis   . Dysrhythmia    if he does not take Metoprolol- Afib  . ED (erectile dysfunction)   . Essential hypertension   . History of MRSA infection    Right foot  . History of stomach ulcers   . HOH (hard of hearing)   . Hypercholesterolemia   . Legally blind   . Lower extremity edema   . Morbid obesity ([place])   . Neuropathy   . NSTEMI (non-ST elevated myocardial infarction) ([place]) 05/2017  . OSA on CPAP   . PAF (paroxysmal atrial fibrillation) ([name])    a. confirmed by event monitor. b. 02/2014 rash on Coumadin, patient decided to discontinue Xarelto due to possible rash, cost and lawyers ads on TV, agreed to take Plavix.  . Pneumonia 2016  . Tunnel vision    Since stroke  . Type II diabetes mellitus ([place])   . Venous stasis   . Weakness 01/11/2017    Patient Active Problem List   Diagnosis Date Noted  . Chronic gingivitis 08/16/2017  . NSTEMI (non-ST elevated myocardial infarction) ([place]) 06/01/2017  . Scaly  skin 05/16/2017  . C. difficile colitis 01/31/2017  . PAT (paroxysmal atrial tachycardia) ([place]) 01/15/2017  . HLD (hyperlipidemia) 01/15/2017  . Hypoglycemia 01/11/2017  . Demand ischemia of myocardium ([place])   . Diarrhea 01/10/2017  . Elevated troponin 01/10/2017  . Dehydration 01/10/2017  . Hypoglycemia due to type 2 diabetes mellitus ([place]) 01/10/2017  . Hypoglycemia associated with diabetes ([place]) 10/17/2016  . Chronic ulcer of left foot ([place]) 08/31/2016  . Acute on chronic diastolic congestive heart failure ([place]) 08/20/2016  . Venous stasis   . Lower extremity edema   . Hypercholesterolemia   . Diabetic neuropathy ([place])   . Unstable angina ([place]) 08/15/2016  . 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 Pressure ulcer 12/27/2015  . Gangrene of toe ([place])  12/26/2015  . Acute renal failure (ARF) ([place]) 12/26/2015  . Subconjunctival hemorrhage 10/21/2015  . Cellulitis in diabetic foot ([place])   . Cellulitis 05/06/2015  . COPD exacerbation ([place]) 10/10/2014  . Hematuria 08/12/2014  . Hypokalemia 08/12/2014  . Urinary retention 08/03/2014  . Hypotension 07/26/2014  . COPD  07/26/2014  . Rash and nonspecific skin eruption 02/13/2014  . PAF (paroxysmal atrial fibrillation) ([place]) 11/13/2013  . Morbid obesity ([place]) 05/24/2012  . Type 2 diabetes, uncontrolled, with retinopathy ([place]) 02/09/2012  . NEOPLASM OF UNCERTAIN BEHAVIOR OF SKIN 12/24/2010  . WEIGHT GAIN, ABNORMAL 02/02/2010  . TOBACCO USE, QUIT 02/02/2010  . DM type 2 causing neurological disease ([place]) 07/12/2007  . HYPOGONADISM 07/12/2007  . B12 deficiency 07/12/2007  . Essential hypertension 07/12/2007  . MYOCARDIAL INFARCTION, HX OF 07/12/2007  . CAD (coronary artery disease) 07/12/2007  . History of CVA (cerebrovascular accident) 07/12/2007  . VENOUS INSUFFICIENCY, LEGS 07/12/2007  . SYMPTOM, MEMORY LOSS 07/12/2007  . DIVERTICULITIS, HX OF 07/12/2007    Past Surgical History:  Procedure Laterality Date  . CARDIAC CATHETERIZATION  1990's  . CARDIAC CATHETERIZATION N/A 08/17/2016   Procedure: Left Heart Cath and Coronary Angiography;  Surgeon: Peter M Martinique, MD;  Location: [facility];  Service: Cardiovascular;  Laterality: N/A;  . CARDIAC CATHETERIZATION N/A 08/19/2016   Procedure: Coronary Balloon Angioplasty;  Surgeon: Peter M Martinique, MD;  Location: [facility];  Service: Cardiovascular;  Laterality: N/A;  . CATARACT EXTRACTION  10/2016  . CEREBRAL ANGIOGRAM  ~ 2000  . COLONOSCOPY    . LEFT HEART CATH AND CORONARY ANGIOGRAPHY N/A 06/01/2017   Procedure: Left Heart Cath and Coronary Angiography;  Surgeon: Burnell Blanks, MD;  Location: [facility];  Service: Cardiovascular;  Laterality: N/A;  . LEFT HEART CATHETERIZATION WITH CORONARY ANGIOGRAM N/A  10/27/2012   Procedure: LEFT HEART CATHETERIZATION WITH CORONARY ANGIOGRAM;  Surgeon: Burnell Blanks, MD;  Location: [facility] C[facility] LAB;  Service: Cardiovascular;  Laterality: N/A;  . TOE AMPUTATION  2006; 2009   "Dr. Blenda Mounts; big toe left foot; little toe on right foot" (10/26/2012)  . TOE AMPUTATION Right 12/24/2015   2nd & 3 toes/notes 1/13//2017  . TOE AMPUTATION Right    5TH TOE  . TRANSMETATARSAL AMPUTATION Right 10/13/2016   Procedure: TRANSMETATARSAL AMPUTATION;  Surgeon: Trula Slade, DPM;  Location: [facility];  Service: Podiatry;  Laterality: Right;        Home Medications    Prior to Admission medications   Medication Sig Start Date End Date Taking? Authorizing Provider  acetaminophen (TYLENOL) 500 MG tablet Take 1,000 mg by mouth at bedtime.     [provider]  amoxicillin-clavulanate (AUGMENTIN) 875-125 MG tablet Take 1 tablet by  mouth 2 (two) times daily. 07/07/18   Trula Slade, DPM  aspirin EC 81 MG tablet Take 81 mg by mouth daily.     [provider]  cholecalciferol (VITAMIN D) 1000 units tablet Take 1,000 Units by mouth daily.     [provider]  clopidogrel (PLAVIX) 75 MG tablet TAKE 1 TABLET EVERY DAY 05/05/18   Plotnikov, Evie Lacks, MD  collagenase (SANTYL) ointment Apply 1 application topically daily. 11/29/16   Trula Slade, DPM  Fluticasone-Salmeterol (ADVAIR DISKUS) 100-50 MCG/DOSE AEPB Inhale 1 puff into the lungs 2 (two) times daily. 03/02/16   Plotnikov, Evie Lacks, MD  furosemide (LASIX) 80 MG tablet TAKE 1/2 TABLET EVERY OTHER DAILY ALTERNATING WITH 1 TABLET EVERY OTHER DAY 08/04/18   Plotnikov, Evie Lacks, MD  gabapentin (NEURONTIN) 300 MG capsule TAKE 1 CAPSULE THREE TIMES DAILY 08/04/18   Plotnikov, Evie Lacks, MD  HYDROcodone-acetaminophen (NORCO/VICODIN) 5-325 MG tablet Take 1-2 tablets by mouth every 6 (six) hours as needed. 08/12/18   Volanda Napoleon, PA-C  insulin aspart (NOVOLOG) 100 UNIT/ML FlexPen Inject 30 Units  into the skin 4 (four) times daily - after meals and at bedtime. Follow SS 05/16/17   Plotnikov, Evie Lacks, MD  isosorbide mononitrate (IMDUR) 60 MG 24 hr tablet TAKE 1 TABLET EVERY DAY 05/05/18   Plotnikov, Evie Lacks, MD  metFORMIN (GLUCOPHAGE) 500 MG tablet TAKE 1 TABLET TWICE DAILY WITH A MEAL 05/05/18   Plotnikov, Evie Lacks, MD  metoprolol tartrate (LOPRESSOR) 25 MG tablet Take 1 tablet (25 mg total) by mouth 2 (two) times daily. 08/04/18   Plotnikov, Evie Lacks, MD  nitroGLYCERIN (NITROSTAT) 0.4 MG SL tablet Place 1 tablet (0.4 mg total) under the tongue every 5 (five) minutes as needed (up to 3 doses). For chest pain 12/19/17   Plotnikov, Evie Lacks, MD  pioglitazone (ACTOS) 45 MG tablet TAKE 1 TABLET EVERY DAY 05/05/18   Plotnikov, Evie Lacks, MD  potassium chloride SA (K-DUR,KLOR-CON) 20 MEQ tablet Take 1 tablet (20 mEq total) by mouth daily. 08/04/18   Plotnikov, Evie Lacks, MD  pravastatin (PRAVACHOL) 40 MG tablet TAKE 1 TABLET EVERY DAY 05/05/18   Plotnikov, Evie Lacks, MD  ranolazine (RANEXA) 500 MG 12 hr tablet TAKE 1 TABLET TWICE DAILY 08/03/18   Plotnikov, Evie Lacks, MD  triamcinolone cream (KENALOG) 0.5 % Apply 1 application topically 2 (two) times daily as needed (rash). 04/18/15   Plotnikov, Evie Lacks, MD  vitamin B-12 (CYANOCOBALAMIN) 1000 MCG tablet Take 1,000 mcg by mouth daily.     [provider]    Family History Family History  Problem Relation Age of Onset  . Breast cancer Mother 72  . Heart disease Father 4  . Asthma Neg Hx     Social History Social History   Tobacco Use  . Smoking status: Former Smoker    Packs/day: 1.00    Years: 30.00    Pack years: 30.00    Types: Cigarettes, Cigars  . Smokeless tobacco: Former Systems developer    Quit date: 08/15/2016  . Tobacco comment: 12/2016 quit smoking in 1998  Substance Use Topics  . Alcohol use: Yes    Comment: 12/2016   rare   . Drug use: No     Allergies   Lipitor [atorvastatin] and Xarelto [rivaroxaban]   Review of  Systems Review of Systems  Constitutional: Negative for fever.  Respiratory: Negative for cough and shortness of breath.   Cardiovascular: Negative for chest pain.  Gastrointestinal: Negative for abdominal  pain, nausea and vomiting.  Genitourinary: Negative for dysuria and hematuria.  Musculoskeletal:       Left shoulder pain  Neurological: Negative for weakness, numbness and headaches.     Physical Exam Updated Vital Signs BP 120/63   Pulse 73   Temp 98.6 F (37 C) (Oral)   Resp 15   Ht 6\' 4"  (1.93 m)   Wt (!) 144.7 kg   SpO2 100%   BMI 38.83 kg/m   Physical Exam  Constitutional: He is oriented to person, place, and time. He appears well-developed and well-nourished.  HENT:  Head: Normocephalic and atraumatic.  Mouth/Throat: Oropharynx is clear and moist and mucous membranes are normal.  Eyes: Pupils are equal, round, and reactive to light. Conjunctivae, EOM and lids are normal.  Neck: Full passive range of motion without pain.  Lungs clear to auscultation bilaterally.  Symmetric chest rise.  No wheezing, rales, rhonchi.  Cardiovascular: Normal rate, regular rhythm, normal heart sounds and normal pulses. Exam reveals no gallop and no friction rub.  No murmur heard. Pulses:      Radial pulses are 2+ on the right side, and 2+ on the left side.  Pulmonary/Chest: Effort normal and breath sounds normal.  Abdominal: Soft. Normal appearance. There is no tenderness. There is no rigidity and no guarding.  Abdomen is soft, non-distended, non-tender. No rigidity, No guarding. No peritoneal signs.  Musculoskeletal: Normal range of motion.       Thoracic back: He exhibits no tenderness.       Lumbar back: He exhibits no tenderness.  Tenderness palpation noted to the left shoulder that extends into the proximal humerus.  No deformity or crepitus noted.  There is some overlying soft tissue swelling.  Limited range of motion of shoulder secondary to pain.  Tenderness palpation noted to  the left elbow.  No deformity or crepitus noted.  Limited range of motion secondary to pain.  Arm is in a modified sling.  No tenderness palpation noted to wrist.  No tenderness palpation noted to right upper extremity.  No deformity or crepitus noted. No tenderness to palpation to bilateral knees and ankles. No deformities or crepitus noted. FROM of BLE without any difficulty.   Neurological: He is alert and oriented to person, place, and time.  Skin: Skin is warm and dry. Capillary refill takes less than 2 seconds.  Good distal cap refill.  LUE is not dusky in appearance or cool to touch.  Psychiatric: He has a normal mood and affect. His speech is normal.  Nursing note and vitals reviewed.    ED Treatments / Results  Labs (all labs ordered are listed, but only abnormal results are displayed) Labs Reviewed  BASIC METABOLIC PANEL - Abnormal; Notable for the following components:      Result Value   Glucose, Bld 152 (*)    BUN 27 (*)    All other components within normal limits  CBC WITH DIFFERENTIAL/PLATELET - Abnormal; Notable for the following components:   RBC 3.78 (*)    Hemoglobin 11.8 (*)    HCT 37.4 (*)    RDW 15.9 (*)    Lymphs Abs 0.5 (*)    All other components within normal limits    EKG None  Radiology Dg Elbow Complete Left  Result Date: 08/12/2018 CLINICAL DATA:  77 year old male with history of trauma from a fall complaining of left elbow pain. EXAM: LEFT ELBOW - COMPLETE 3+ VIEW COMPARISON:  None. FINDINGS: There is no evidence of fracture,  dislocation, or joint effusion. There is no evidence of arthropathy or other focal bone abnormality. Soft tissues are unremarkable. IMPRESSION: Negative. Electronically Signed   By: Vinnie Langton M.D.   On: 08/12/2018 22:15   Dg Shoulder Left  Result Date: 08/12/2018 CLINICAL DATA:  Patient status post fall.  Initial encounter. EXAM: LEFT SHOULDER - 2+ VIEW COMPARISON:  None. FINDINGS: Mildly angulated transverse fracture  through the proximal left humerus. Humeral head appears located. Visualized left hemithorax is unremarkable. IMPRESSION: Mildly displaced transverse fracture through the proximal left humerus. Electronically Signed   By: Lovey Newcomer M.D.   On: 08/12/2018 22:15   Dg Humerus Left  Result Date: 08/12/2018 CLINICAL DATA:  77 year old male with history of trauma from a fall complaining of pain in the left shoulder. EXAM: LEFT HUMERUS - 2+ VIEW COMPARISON:  No priors. FINDINGS: Horizontal fracture through the humeral neck which appears mildly impacted. Mild varus rotation. Distal aspect of the humerus is otherwise intact. IMPRESSION: 1. Horizontal fracture through the humeral neck with mild varus rotation and mild impaction. Electronically Signed   By: Vinnie Langton M.D.   On: 08/12/2018 22:18    Procedures Procedures (including critical care time)  Medications Ordered in ED Medications  ondansetron [facility]) injection 4 mg (4 mg Intravenous Given 08/12/18 2149)  fentaNYL (SUBLIMAZE) injection 100 mcg (100 mcg Intravenous Given 08/12/18 2149)  sodium chloride 0.9 % bolus 500 mL (0 mLs Intravenous Stopped 08/13/18 0027)  morphine 4 MG/ML injection 4 mg (4 mg Intravenous Given 08/12/18 2328)     Initial Impression / Assessment and Plan / ED Course  I have reviewed the triage vital signs and the nursing notes.  Pertinent labs & imaging results that were available during my care of the patient were reviewed by me and considered in my medical decision making (see chart for details).     77 year old male who presents for evaluation of left shoulder pain after mechanical fall that occurred earlier today.  Patient reports he was bending over to feed his dog when he fell backwards, landing on his shoulder.  He states he did not hit his head or have any LOC.  He was able to get up with the help of EMS and walked to the ambulance.  He states that since then, he has not been able to move his arm.  Patient  denies any vision changes, nausea/vomiting, numbness/weakness of his arms or legs, neck pain, back pain. Patient is afebrile, non-toxic appearing, sitting comfortably on examination table. Vital signs reviewed and stable.  Patient is neurovascularly intact.  On exam he has tenderness palpation noted to the left upper extremity with overlying soft tissue swelling.  Limited range of motion secondary to pain.  Given reassuring exam and NEXUS criteria, no indication for cervical imaging at this time.  Consider fracture versus dislocation.  We will plan for x-ray imaging of left upper extremity.  Patient did not have any head trauma and denies any LOC.  He has a reassuring neuro exam.  No neuro deficits noted but unable to assess strength of left upper extremity secondary to pain.  Do not suspect intracranial hemorrhage, skull fracture based on history/physical exam.  CBC with no evidence of leukocytosis.  Hemoglobin is 11.8.  BMP unremarkable.  X-ray shows a horizontal fracture through the humeral neck with mild varus rotation and mild impaction.  Updated patient on results.  Here still reports some pain.  Will give additional analgesics.  Discussed patient with Dr. Griffin Basil (ortho).  Agrees with plan for sling immobilization.  We will plan to see patient in the office on 09/01/2018.  Updated patient and family on plan.  Wife expressed concerns to me about patient being able to go home.  She reports that he ambulates with the assistance of a walker and a cane.  Patient states he has no difficulty ambulating but states that he has some difficulty getting himself up from a seated position.  I discussed treatment options with patient.  At this time, given x-ray and ability to control pain, do not feel that patient needs admission.  I offered to do home health care services with plans for PT, OT, home health care nurse to come out to the house.  Patient himself states that he does not wish to be admitted but wanted to  express his concerns to me.  Discussed pain control options.  Patient has had poor reactions to Percocet in the past.  Wife states that she thinks he has done okay with Vicodin.  I encouraged monitored use of pain medication.  Instructed patient to leave the sling on for support and stabilization.  Patient instructed to follow-up with outpatient orthopedics for further evaluation. Patient had ample opportunity for questions and discussion. All patient's questions were answered with full understanding. Strict return precautions discussed. Patient expresses understanding and agreement to plan.     Final Clinical Impressions(s) / ED Diagnoses   Final diagnoses:  Closed displaced fracture of surgical neck of left humerus, unspecified fracture morphology, initial encounter    ED Discharge Orders         [facility] [place]     08/12/18 2339    Face-to-face encounter (required for Medicare/Medicaid patients)    Comments:  I Volanda Napoleon certify that this patient is under my care and that I, or a nurse practitioner or physician's assistant working with me, had a face-to-face encounter that meets the physician face-to-face encounter requirements with this patient on 08/12/2018. The encounter with the patient was in whole, or in part for the following medical condition(s) which is the primary reason for home health care (List medical condition):   08/12/18 2339    HYDROcodone-acetaminophen (NORCO/VICODIN) 5-325 MG tablet  Every 6 hours PRN     08/12/18 2352           Volanda Napoleon, PA-C 08/13/18 0204    Lennice Sites, DO 08/13/18 1107

## 2018-08-12 NOTE — Discharge Instructions (Signed)
Keep the arm in the sling.  You can take Tylenol for pain.  You can take 1000 mg of Tylenol 3 times a day.  Do not exceed 4000 mg of Tylenol a day.  He can take the pain medication for severe or breakthrough pain.  If you take the pain medication, only take 2000 mg of Tylenol a day.  Follow the RICE (Rest, Ice, Compression, Elevation) protocol as directed.   Please follow-up with Dr. Griffin Basil, the orthopedic doctor as directed.  Call their office on Tuesday to arrange an appointment.  Return to emergency department for any worsening pain, numbness/weakness of the arm, discoloration, fevers or any other worsening or concerning symptoms.

## 2018-08-15 ENCOUNTER — Ambulatory Visit: Payer: Medicare Other | Admitting: Podiatry

## 2018-08-15 ENCOUNTER — Other Ambulatory Visit: Payer: Medicare Other | Admitting: Orthotics

## 2018-08-16 DIAGNOSIS — M25512 Pain in left shoulder: Secondary | ICD-10-CM | POA: Diagnosis not present

## 2018-08-23 ENCOUNTER — Ambulatory Visit (INDEPENDENT_AMBULATORY_CARE_PROVIDER_SITE_OTHER): Payer: Medicare Other | Admitting: Internal Medicine

## 2018-08-23 ENCOUNTER — Other Ambulatory Visit (INDEPENDENT_AMBULATORY_CARE_PROVIDER_SITE_OTHER): Payer: Medicare Other

## 2018-08-23 ENCOUNTER — Encounter: Payer: Self-pay | Admitting: Internal Medicine

## 2018-08-23 VITALS — BP 126/72 | HR 61 | Temp 98.0°F | Ht 76.0 in | Wt 325.0 lb

## 2018-08-23 DIAGNOSIS — E1165 Type 2 diabetes mellitus with hyperglycemia: Secondary | ICD-10-CM | POA: Diagnosis not present

## 2018-08-23 DIAGNOSIS — E11319 Type 2 diabetes mellitus with unspecified diabetic retinopathy without macular edema: Secondary | ICD-10-CM

## 2018-08-23 DIAGNOSIS — S42202S Unspecified fracture of upper end of left humerus, sequela: Secondary | ICD-10-CM | POA: Diagnosis not present

## 2018-08-23 DIAGNOSIS — E538 Deficiency of other specified B group vitamins: Secondary | ICD-10-CM

## 2018-08-23 DIAGNOSIS — Z23 Encounter for immunization: Secondary | ICD-10-CM | POA: Diagnosis not present

## 2018-08-23 DIAGNOSIS — IMO0002 Reserved for concepts with insufficient information to code with codable children: Secondary | ICD-10-CM

## 2018-08-23 DIAGNOSIS — E1149 Type 2 diabetes mellitus with other diabetic neurological complication: Secondary | ICD-10-CM | POA: Diagnosis not present

## 2018-08-23 DIAGNOSIS — S42309A Unspecified fracture of shaft of humerus, unspecified arm, initial encounter for closed fracture: Secondary | ICD-10-CM | POA: Insufficient documentation

## 2018-08-23 DIAGNOSIS — I2510 Atherosclerotic heart disease of native coronary artery without angina pectoris: Secondary | ICD-10-CM | POA: Diagnosis not present

## 2018-08-23 DIAGNOSIS — I480 Paroxysmal atrial fibrillation: Secondary | ICD-10-CM

## 2018-08-23 LAB — HEMOGLOBIN A1C: Hgb A1c MFr Bld: 7.7 % — ABNORMAL HIGH (ref 4.6–6.5)

## 2018-08-23 LAB — BASIC METABOLIC PANEL
BUN: 20 mg/dL (ref 6–23)
CHLORIDE: 101 meq/L (ref 96–112)
CO2: 28 mEq/L (ref 19–32)
CREATININE: 0.76 mg/dL (ref 0.40–1.50)
Calcium: 9.2 mg/dL (ref 8.4–10.5)
GFR: 105.71 mL/min (ref >60.00)
Glucose, Bld: 172 mg/dL — ABNORMAL HIGH (ref 70–99)
Potassium: 4.2 mEq/L (ref 3.5–5.1)
SODIUM: 136 meq/L (ref 135–145)

## 2018-08-23 MED ORDER — INSULIN ASPART 100 UNIT/ML FLEXPEN: 20.0000 [IU] | PEN_INJECTOR | Freq: Three times a day (TID) | SUBCUTANEOUS | 3 refills | Status: DC

## 2018-08-23 NOTE — Assessment & Plan Note (Addendum)
Novolog pen Metformin, Actos

## 2018-08-23 NOTE — Assessment & Plan Note (Signed)
In a sling. F/u w/Ortho

## 2018-08-23 NOTE — Assessment & Plan Note (Signed)
B 12

## 2018-08-23 NOTE — Addendum Note (Signed)
Addended by: Karren Cobble on: 08/23/2018 02:57 PM   Modules accepted: Orders

## 2018-08-23 NOTE — Assessment & Plan Note (Signed)
Novolog pen Metformin, Actos

## 2018-08-23 NOTE — Assessment & Plan Note (Signed)
Lopressor, Plavix, ASA

## 2018-08-23 NOTE — Progress Notes (Signed)
Subjective:  Patient ID: Charles [name], male    DOB: [birth year]  Age: 77 y.o. MRN: [id number]  CC: No chief complaint on file.   HPI Charles [name] presents for DM, HTN, CAD f/u C/o L humerus fx on 8/31 - pain. No need for surgery acc to Ortho  Outpatient Medications Prior to Visit  Medication Sig Dispense Refill  . acetaminophen (TYLENOL) 500 MG tablet Take 1,000 mg by mouth at bedtime.     Marland Kitchen amoxicillin-clavulanate (AUGMENTIN) 875-125 MG tablet Take 1 tablet by mouth 2 (two) times daily. 20 tablet 0  . aspirin EC 81 MG tablet Take 81 mg by mouth daily.     . cholecalciferol (VITAMIN D) 1000 units tablet Take 1,000 Units by mouth daily.     . clopidogrel (PLAVIX) 75 MG tablet TAKE 1 TABLET EVERY DAY 90 tablet 3  . collagenase (SANTYL) ointment Apply 1 application topically daily. 15 g 3  . Fluticasone-Salmeterol (ADVAIR DISKUS) 100-50 MCG/DOSE AEPB Inhale 1 puff into the lungs 2 (two) times daily. 3 each 3  . furosemide (LASIX) 80 MG tablet TAKE 1/2 TABLET EVERY OTHER DAILY ALTERNATING WITH 1 TABLET EVERY OTHER DAY 68 tablet 3  . gabapentin (NEURONTIN) 300 MG capsule TAKE 1 CAPSULE THREE TIMES DAILY 270 capsule 3  . HYDROcodone-acetaminophen (NORCO/VICODIN) 5-325 MG tablet Take 1-2 tablets by mouth every 6 (six) hours as needed. 10 tablet 0  . insulin aspart (NOVOLOG) 100 UNIT/ML FlexPen Inject 30 Units into the skin 4 (four) times daily - after meals and at bedtime. Follow SS 15 pen 3  . isosorbide mononitrate (IMDUR) 60 MG 24 hr tablet TAKE 1 TABLET EVERY DAY 90 tablet 3  . metFORMIN (GLUCOPHAGE) 500 MG tablet TAKE 1 TABLET TWICE DAILY WITH A MEAL 180 tablet 3  . metoprolol tartrate (LOPRESSOR) 25 MG tablet Take 1 tablet (25 mg total) by mouth 2 (two) times daily. 180 tablet 3  . nitroGLYCERIN (NITROSTAT) 0.4 MG SL tablet Place 1 tablet (0.4 mg total) under the tongue every 5 (five) minutes as needed (up to 3 doses). For chest pain 60 tablet 3  . pioglitazone (ACTOS) 45 MG tablet  TAKE 1 TABLET EVERY DAY 90 tablet 3  . potassium chloride SA (K-DUR,KLOR-CON) 20 MEQ tablet Take 1 tablet (20 mEq total) by mouth daily. 90 tablet 3  . pravastatin (PRAVACHOL) 40 MG tablet TAKE 1 TABLET EVERY DAY 90 tablet 3  . ranolazine (RANEXA) 500 MG 12 hr tablet TAKE 1 TABLET TWICE DAILY 180 tablet 3  . triamcinolone cream (KENALOG) 0.5 % Apply 1 application topically 2 (two) times daily as needed (rash). 30 g 3  . vitamin B-12 (CYANOCOBALAMIN) 1000 MCG tablet Take 1,000 mcg by mouth daily.      No facility-administered medications prior to visit.     ROS: Review of Systems  Constitutional: Positive for fatigue. Negative for appetite change and unexpected weight change.  HENT: Negative for congestion, nosebleeds, sneezing, sore throat and trouble swallowing.   Eyes: Negative for itching and visual disturbance.  Respiratory: Negative for cough.   Cardiovascular: Positive for leg swelling. Negative for chest pain and palpitations.  Gastrointestinal: Negative for abdominal distention, blood in stool, diarrhea and nausea.  Genitourinary: Negative for frequency and hematuria.  Musculoskeletal: Positive for arthralgias and gait problem. Negative for back pain, joint swelling and neck pain.  Skin: Negative for rash.  Neurological: Negative for dizziness, tremors, speech difficulty and weakness.  Psychiatric/Behavioral: Negative for agitation, dysphoric mood, sleep disturbance and  suicidal ideas. The patient is not nervous/anxious.     Objective:  BP 126/72 (BP Location: Right Arm, Patient Position: Sitting, Cuff Size: Large)   Pulse 61   Temp 98 F (36.7 C) (Oral)   Ht 6\' 4"  (1.93 m)   Wt (!) 325 lb (147.4 kg)   SpO2 98%   BMI 39.56 kg/m   BP Readings from Last 3 Encounters:  08/23/18 126/72  08/13/18 120/63  04/18/18 120/66    Wt Readings from Last 3 Encounters:  08/23/18 (!) 325 lb (147.4 kg)  08/12/18 (!) 319 lb 0.1 oz (144.7 kg)  04/18/18 (!) 319 lb (144.7 kg)     Physical Exam  Constitutional: He is oriented to person, place, and time. He appears well-developed. No distress.  NAD  HENT:  Mouth/Throat: Oropharynx is clear and moist.  Eyes: Pupils are equal, round, and reactive to light. Conjunctivae are normal.  Neck: Normal range of motion. No JVD present. No thyromegaly present.  Cardiovascular: Normal rate, regular rhythm, normal heart sounds and intact distal pulses. Exam reveals no gallop and no friction rub.  No murmur heard. Pulmonary/Chest: Effort normal and breath sounds normal. No respiratory distress. He has no wheezes. He has no rales. He exhibits no tenderness.  Abdominal: Soft. Bowel sounds are normal. He exhibits no distension and no mass. There is no tenderness. There is no rebound and no guarding.  Musculoskeletal: Normal range of motion. He exhibits edema and tenderness.  Lymphadenopathy:    He has no cervical adenopathy.  Neurological: He is alert and oriented to person, place, and time. He has normal reflexes. No cranial nerve deficit. He exhibits normal muscle tone. He displays a negative Romberg sign. Coordination abnormal. Gait normal.  Skin: Skin is warm and dry. No rash noted.  Psychiatric: He has a normal mood and affect. His behavior is normal. Judgment and thought content normal.  trace edema B In a w/c L arm is in a sling, tender  Lab Results  Component Value Date   WBC 6.6 08/12/2018   HGB 11.8 (L) 08/12/2018   HCT 37.4 (L) 08/12/2018   PLT 228 08/12/2018   GLUCOSE 152 (H) 08/12/2018   CHOL 131 06/01/2017   TRIG 31 06/01/2017   HDL 45 06/01/2017   LDLCALC 80 06/01/2017   ALT 7 03/10/2017   AST 15 03/10/2017   NA 138 08/12/2018   K 4.1 08/12/2018   CL 99 08/12/2018   CREATININE 0.98 08/12/2018   BUN 27 (H) 08/12/2018   CO2 29 08/12/2018   TSH 2.584 06/01/2017   PSA 0.96 08/28/2010   INR 1.06 06/01/2017   HGBA1C 8.1 (H) 04/18/2018   MICROALBUR 1.4 08/28/2010    Dg Elbow Complete Left  Result  Date: 08/12/2018 CLINICAL DATA:  77 year old male with history of trauma from a fall complaining of left elbow pain. EXAM: LEFT ELBOW - COMPLETE 3+ VIEW COMPARISON:  None. FINDINGS: There is no evidence of fracture, dislocation, or joint effusion. There is no evidence of arthropathy or other focal bone abnormality. Soft tissues are unremarkable. IMPRESSION: Negative. Electronically Signed   By: Vinnie Langton M.D.   On: 08/12/2018 22:15   Dg Shoulder Left  Result Date: 08/12/2018 CLINICAL DATA:  Patient status post fall.  Initial encounter. EXAM: LEFT SHOULDER - 2+ VIEW COMPARISON:  None. FINDINGS: Mildly angulated transverse fracture through the proximal left humerus. Humeral head appears located. Visualized left hemithorax is unremarkable. IMPRESSION: Mildly displaced transverse fracture through the proximal left humerus. Electronically Signed  By: Lovey Newcomer M.D.   On: 08/12/2018 22:15   Dg Humerus Left  Result Date: 08/12/2018 CLINICAL DATA:  77 year old male with history of trauma from a fall complaining of pain in the left shoulder. EXAM: LEFT HUMERUS - 2+ VIEW COMPARISON:  No priors. FINDINGS: Horizontal fracture through the humeral neck which appears mildly impacted. Mild varus rotation. Distal aspect of the humerus is otherwise intact. IMPRESSION: 1. Horizontal fracture through the humeral neck with mild varus rotation and mild impaction. Electronically Signed   By: Vinnie Langton M.D.   On: 08/12/2018 22:18    Assessment & Plan:   There are no diagnoses linked to this encounter.   No orders of the defined types were placed in this encounter.    Follow-up: No follow-ups on file.  [name], MD

## 2018-09-01 DIAGNOSIS — M25522 Pain in left elbow: Secondary | ICD-10-CM | POA: Diagnosis not present

## 2018-09-01 DIAGNOSIS — M25512 Pain in left shoulder: Secondary | ICD-10-CM | POA: Diagnosis not present

## 2018-10-06 DIAGNOSIS — M25512 Pain in left shoulder: Secondary | ICD-10-CM | POA: Diagnosis not present

## 2018-10-09 DIAGNOSIS — Z794 Long term (current) use of insulin: Secondary | ICD-10-CM | POA: Diagnosis not present

## 2018-10-09 DIAGNOSIS — Z79891 Long term (current) use of opiate analgesic: Secondary | ICD-10-CM | POA: Diagnosis not present

## 2018-10-09 DIAGNOSIS — E11319 Type 2 diabetes mellitus with unspecified diabetic retinopathy without macular edema: Secondary | ICD-10-CM | POA: Diagnosis not present

## 2018-10-09 DIAGNOSIS — E538 Deficiency of other specified B group vitamins: Secondary | ICD-10-CM | POA: Diagnosis not present

## 2018-10-09 DIAGNOSIS — Z7982 Long term (current) use of aspirin: Secondary | ICD-10-CM | POA: Diagnosis not present

## 2018-10-09 DIAGNOSIS — S42202D Unspecified fracture of upper end of left humerus, subsequent encounter for fracture with routine healing: Secondary | ICD-10-CM | POA: Diagnosis not present

## 2018-10-09 DIAGNOSIS — Z89431 Acquired absence of right foot: Secondary | ICD-10-CM | POA: Diagnosis not present

## 2018-10-09 DIAGNOSIS — I10 Essential (primary) hypertension: Secondary | ICD-10-CM | POA: Diagnosis not present

## 2018-10-09 DIAGNOSIS — Z89412 Acquired absence of left great toe: Secondary | ICD-10-CM | POA: Diagnosis not present

## 2018-10-09 DIAGNOSIS — Z9181 History of falling: Secondary | ICD-10-CM | POA: Diagnosis not present

## 2018-10-09 DIAGNOSIS — I480 Paroxysmal atrial fibrillation: Secondary | ICD-10-CM | POA: Diagnosis not present

## 2018-10-09 DIAGNOSIS — I2510 Atherosclerotic heart disease of native coronary artery without angina pectoris: Secondary | ICD-10-CM | POA: Diagnosis not present

## 2018-10-11 DIAGNOSIS — I2510 Atherosclerotic heart disease of native coronary artery without angina pectoris: Secondary | ICD-10-CM | POA: Diagnosis not present

## 2018-10-11 DIAGNOSIS — S42202D Unspecified fracture of upper end of left humerus, subsequent encounter for fracture with routine healing: Secondary | ICD-10-CM | POA: Diagnosis not present

## 2018-10-11 DIAGNOSIS — I10 Essential (primary) hypertension: Secondary | ICD-10-CM | POA: Diagnosis not present

## 2018-10-11 DIAGNOSIS — E11319 Type 2 diabetes mellitus with unspecified diabetic retinopathy without macular edema: Secondary | ICD-10-CM | POA: Diagnosis not present

## 2018-10-11 DIAGNOSIS — I480 Paroxysmal atrial fibrillation: Secondary | ICD-10-CM | POA: Diagnosis not present

## 2018-10-11 DIAGNOSIS — E538 Deficiency of other specified B group vitamins: Secondary | ICD-10-CM | POA: Diagnosis not present

## 2018-10-13 DIAGNOSIS — E538 Deficiency of other specified B group vitamins: Secondary | ICD-10-CM | POA: Diagnosis not present

## 2018-10-13 DIAGNOSIS — E11319 Type 2 diabetes mellitus with unspecified diabetic retinopathy without macular edema: Secondary | ICD-10-CM | POA: Diagnosis not present

## 2018-10-13 DIAGNOSIS — I2510 Atherosclerotic heart disease of native coronary artery without angina pectoris: Secondary | ICD-10-CM | POA: Diagnosis not present

## 2018-10-13 DIAGNOSIS — I10 Essential (primary) hypertension: Secondary | ICD-10-CM | POA: Diagnosis not present

## 2018-10-13 DIAGNOSIS — S42202D Unspecified fracture of upper end of left humerus, subsequent encounter for fracture with routine healing: Secondary | ICD-10-CM | POA: Diagnosis not present

## 2018-10-13 DIAGNOSIS — I480 Paroxysmal atrial fibrillation: Secondary | ICD-10-CM | POA: Diagnosis not present

## 2018-10-17 DIAGNOSIS — I2510 Atherosclerotic heart disease of native coronary artery without angina pectoris: Secondary | ICD-10-CM | POA: Diagnosis not present

## 2018-10-17 DIAGNOSIS — S42202D Unspecified fracture of upper end of left humerus, subsequent encounter for fracture with routine healing: Secondary | ICD-10-CM | POA: Diagnosis not present

## 2018-10-17 DIAGNOSIS — E11319 Type 2 diabetes mellitus with unspecified diabetic retinopathy without macular edema: Secondary | ICD-10-CM | POA: Diagnosis not present

## 2018-10-17 DIAGNOSIS — I480 Paroxysmal atrial fibrillation: Secondary | ICD-10-CM | POA: Diagnosis not present

## 2018-10-17 DIAGNOSIS — I10 Essential (primary) hypertension: Secondary | ICD-10-CM | POA: Diagnosis not present

## 2018-10-17 DIAGNOSIS — E538 Deficiency of other specified B group vitamins: Secondary | ICD-10-CM | POA: Diagnosis not present

## 2018-10-18 DIAGNOSIS — S42202D Unspecified fracture of upper end of left humerus, subsequent encounter for fracture with routine healing: Secondary | ICD-10-CM | POA: Diagnosis not present

## 2018-10-18 DIAGNOSIS — E538 Deficiency of other specified B group vitamins: Secondary | ICD-10-CM | POA: Diagnosis not present

## 2018-10-18 DIAGNOSIS — I10 Essential (primary) hypertension: Secondary | ICD-10-CM | POA: Diagnosis not present

## 2018-10-18 DIAGNOSIS — I480 Paroxysmal atrial fibrillation: Secondary | ICD-10-CM | POA: Diagnosis not present

## 2018-10-18 DIAGNOSIS — I2510 Atherosclerotic heart disease of native coronary artery without angina pectoris: Secondary | ICD-10-CM | POA: Diagnosis not present

## 2018-10-18 DIAGNOSIS — E11319 Type 2 diabetes mellitus with unspecified diabetic retinopathy without macular edema: Secondary | ICD-10-CM | POA: Diagnosis not present

## 2018-10-19 DIAGNOSIS — I2510 Atherosclerotic heart disease of native coronary artery without angina pectoris: Secondary | ICD-10-CM | POA: Diagnosis not present

## 2018-10-19 DIAGNOSIS — E538 Deficiency of other specified B group vitamins: Secondary | ICD-10-CM | POA: Diagnosis not present

## 2018-10-19 DIAGNOSIS — E11319 Type 2 diabetes mellitus with unspecified diabetic retinopathy without macular edema: Secondary | ICD-10-CM | POA: Diagnosis not present

## 2018-10-19 DIAGNOSIS — I10 Essential (primary) hypertension: Secondary | ICD-10-CM | POA: Diagnosis not present

## 2018-10-19 DIAGNOSIS — I480 Paroxysmal atrial fibrillation: Secondary | ICD-10-CM | POA: Diagnosis not present

## 2018-10-19 DIAGNOSIS — S42202D Unspecified fracture of upper end of left humerus, subsequent encounter for fracture with routine healing: Secondary | ICD-10-CM | POA: Diagnosis not present

## 2018-10-24 DIAGNOSIS — I10 Essential (primary) hypertension: Secondary | ICD-10-CM | POA: Diagnosis not present

## 2018-10-24 DIAGNOSIS — E538 Deficiency of other specified B group vitamins: Secondary | ICD-10-CM | POA: Diagnosis not present

## 2018-10-24 DIAGNOSIS — S42202D Unspecified fracture of upper end of left humerus, subsequent encounter for fracture with routine healing: Secondary | ICD-10-CM | POA: Diagnosis not present

## 2018-10-24 DIAGNOSIS — I2510 Atherosclerotic heart disease of native coronary artery without angina pectoris: Secondary | ICD-10-CM | POA: Diagnosis not present

## 2018-10-24 DIAGNOSIS — E11319 Type 2 diabetes mellitus with unspecified diabetic retinopathy without macular edema: Secondary | ICD-10-CM | POA: Diagnosis not present

## 2018-10-24 DIAGNOSIS — I480 Paroxysmal atrial fibrillation: Secondary | ICD-10-CM | POA: Diagnosis not present

## 2018-10-26 DIAGNOSIS — I10 Essential (primary) hypertension: Secondary | ICD-10-CM | POA: Diagnosis not present

## 2018-10-26 DIAGNOSIS — S42202D Unspecified fracture of upper end of left humerus, subsequent encounter for fracture with routine healing: Secondary | ICD-10-CM | POA: Diagnosis not present

## 2018-10-26 DIAGNOSIS — I2510 Atherosclerotic heart disease of native coronary artery without angina pectoris: Secondary | ICD-10-CM | POA: Diagnosis not present

## 2018-10-26 DIAGNOSIS — I480 Paroxysmal atrial fibrillation: Secondary | ICD-10-CM | POA: Diagnosis not present

## 2018-10-26 DIAGNOSIS — E538 Deficiency of other specified B group vitamins: Secondary | ICD-10-CM | POA: Diagnosis not present

## 2018-10-26 DIAGNOSIS — E11319 Type 2 diabetes mellitus with unspecified diabetic retinopathy without macular edema: Secondary | ICD-10-CM | POA: Diagnosis not present

## 2018-10-31 DIAGNOSIS — I2510 Atherosclerotic heart disease of native coronary artery without angina pectoris: Secondary | ICD-10-CM | POA: Diagnosis not present

## 2018-10-31 DIAGNOSIS — E11319 Type 2 diabetes mellitus with unspecified diabetic retinopathy without macular edema: Secondary | ICD-10-CM | POA: Diagnosis not present

## 2018-10-31 DIAGNOSIS — S42202D Unspecified fracture of upper end of left humerus, subsequent encounter for fracture with routine healing: Secondary | ICD-10-CM | POA: Diagnosis not present

## 2018-10-31 DIAGNOSIS — I10 Essential (primary) hypertension: Secondary | ICD-10-CM | POA: Diagnosis not present

## 2018-10-31 DIAGNOSIS — E538 Deficiency of other specified B group vitamins: Secondary | ICD-10-CM | POA: Diagnosis not present

## 2018-10-31 DIAGNOSIS — I480 Paroxysmal atrial fibrillation: Secondary | ICD-10-CM | POA: Diagnosis not present

## 2018-11-02 DIAGNOSIS — I480 Paroxysmal atrial fibrillation: Secondary | ICD-10-CM | POA: Diagnosis not present

## 2018-11-02 DIAGNOSIS — S42202D Unspecified fracture of upper end of left humerus, subsequent encounter for fracture with routine healing: Secondary | ICD-10-CM | POA: Diagnosis not present

## 2018-11-02 DIAGNOSIS — E11319 Type 2 diabetes mellitus with unspecified diabetic retinopathy without macular edema: Secondary | ICD-10-CM | POA: Diagnosis not present

## 2018-11-02 DIAGNOSIS — I10 Essential (primary) hypertension: Secondary | ICD-10-CM | POA: Diagnosis not present

## 2018-11-02 DIAGNOSIS — I2510 Atherosclerotic heart disease of native coronary artery without angina pectoris: Secondary | ICD-10-CM | POA: Diagnosis not present

## 2018-11-02 DIAGNOSIS — E538 Deficiency of other specified B group vitamins: Secondary | ICD-10-CM | POA: Diagnosis not present

## 2018-11-06 DIAGNOSIS — I10 Essential (primary) hypertension: Secondary | ICD-10-CM | POA: Diagnosis not present

## 2018-11-06 DIAGNOSIS — I2510 Atherosclerotic heart disease of native coronary artery without angina pectoris: Secondary | ICD-10-CM | POA: Diagnosis not present

## 2018-11-06 DIAGNOSIS — E538 Deficiency of other specified B group vitamins: Secondary | ICD-10-CM | POA: Diagnosis not present

## 2018-11-06 DIAGNOSIS — E11319 Type 2 diabetes mellitus with unspecified diabetic retinopathy without macular edema: Secondary | ICD-10-CM | POA: Diagnosis not present

## 2018-11-06 DIAGNOSIS — S42202D Unspecified fracture of upper end of left humerus, subsequent encounter for fracture with routine healing: Secondary | ICD-10-CM | POA: Diagnosis not present

## 2018-11-06 DIAGNOSIS — I480 Paroxysmal atrial fibrillation: Secondary | ICD-10-CM | POA: Diagnosis not present

## 2018-11-07 DIAGNOSIS — E11319 Type 2 diabetes mellitus with unspecified diabetic retinopathy without macular edema: Secondary | ICD-10-CM | POA: Diagnosis not present

## 2018-11-07 DIAGNOSIS — E538 Deficiency of other specified B group vitamins: Secondary | ICD-10-CM | POA: Diagnosis not present

## 2018-11-07 DIAGNOSIS — I480 Paroxysmal atrial fibrillation: Secondary | ICD-10-CM | POA: Diagnosis not present

## 2018-11-07 DIAGNOSIS — S42202D Unspecified fracture of upper end of left humerus, subsequent encounter for fracture with routine healing: Secondary | ICD-10-CM | POA: Diagnosis not present

## 2018-11-07 DIAGNOSIS — I2510 Atherosclerotic heart disease of native coronary artery without angina pectoris: Secondary | ICD-10-CM | POA: Diagnosis not present

## 2018-11-07 DIAGNOSIS — I10 Essential (primary) hypertension: Secondary | ICD-10-CM | POA: Diagnosis not present

## 2018-11-08 DIAGNOSIS — I2510 Atherosclerotic heart disease of native coronary artery without angina pectoris: Secondary | ICD-10-CM | POA: Diagnosis not present

## 2018-11-08 DIAGNOSIS — E538 Deficiency of other specified B group vitamins: Secondary | ICD-10-CM | POA: Diagnosis not present

## 2018-11-08 DIAGNOSIS — S42202D Unspecified fracture of upper end of left humerus, subsequent encounter for fracture with routine healing: Secondary | ICD-10-CM | POA: Diagnosis not present

## 2018-11-08 DIAGNOSIS — I10 Essential (primary) hypertension: Secondary | ICD-10-CM | POA: Diagnosis not present

## 2018-11-08 DIAGNOSIS — E11319 Type 2 diabetes mellitus with unspecified diabetic retinopathy without macular edema: Secondary | ICD-10-CM | POA: Diagnosis not present

## 2018-11-08 DIAGNOSIS — I480 Paroxysmal atrial fibrillation: Secondary | ICD-10-CM | POA: Diagnosis not present

## 2018-11-14 DIAGNOSIS — E11319 Type 2 diabetes mellitus with unspecified diabetic retinopathy without macular edema: Secondary | ICD-10-CM | POA: Diagnosis not present

## 2018-11-14 DIAGNOSIS — I480 Paroxysmal atrial fibrillation: Secondary | ICD-10-CM | POA: Diagnosis not present

## 2018-11-14 DIAGNOSIS — I2510 Atherosclerotic heart disease of native coronary artery without angina pectoris: Secondary | ICD-10-CM | POA: Diagnosis not present

## 2018-11-14 DIAGNOSIS — I10 Essential (primary) hypertension: Secondary | ICD-10-CM | POA: Diagnosis not present

## 2018-11-14 DIAGNOSIS — S42202D Unspecified fracture of upper end of left humerus, subsequent encounter for fracture with routine healing: Secondary | ICD-10-CM | POA: Diagnosis not present

## 2018-11-14 DIAGNOSIS — E538 Deficiency of other specified B group vitamins: Secondary | ICD-10-CM | POA: Diagnosis not present

## 2018-11-16 DIAGNOSIS — E11319 Type 2 diabetes mellitus with unspecified diabetic retinopathy without macular edema: Secondary | ICD-10-CM | POA: Diagnosis not present

## 2018-11-16 DIAGNOSIS — I480 Paroxysmal atrial fibrillation: Secondary | ICD-10-CM | POA: Diagnosis not present

## 2018-11-16 DIAGNOSIS — I2510 Atherosclerotic heart disease of native coronary artery without angina pectoris: Secondary | ICD-10-CM | POA: Diagnosis not present

## 2018-11-16 DIAGNOSIS — S42202D Unspecified fracture of upper end of left humerus, subsequent encounter for fracture with routine healing: Secondary | ICD-10-CM | POA: Diagnosis not present

## 2018-11-16 DIAGNOSIS — E538 Deficiency of other specified B group vitamins: Secondary | ICD-10-CM | POA: Diagnosis not present

## 2018-11-16 DIAGNOSIS — I10 Essential (primary) hypertension: Secondary | ICD-10-CM | POA: Diagnosis not present

## 2018-11-17 DIAGNOSIS — M25512 Pain in left shoulder: Secondary | ICD-10-CM | POA: Diagnosis not present

## 2018-11-21 DIAGNOSIS — I2510 Atherosclerotic heart disease of native coronary artery without angina pectoris: Secondary | ICD-10-CM | POA: Diagnosis not present

## 2018-11-21 DIAGNOSIS — I480 Paroxysmal atrial fibrillation: Secondary | ICD-10-CM | POA: Diagnosis not present

## 2018-11-21 DIAGNOSIS — I10 Essential (primary) hypertension: Secondary | ICD-10-CM | POA: Diagnosis not present

## 2018-11-21 DIAGNOSIS — S42202D Unspecified fracture of upper end of left humerus, subsequent encounter for fracture with routine healing: Secondary | ICD-10-CM | POA: Diagnosis not present

## 2018-11-21 DIAGNOSIS — E538 Deficiency of other specified B group vitamins: Secondary | ICD-10-CM | POA: Diagnosis not present

## 2018-11-21 DIAGNOSIS — E11319 Type 2 diabetes mellitus with unspecified diabetic retinopathy without macular edema: Secondary | ICD-10-CM | POA: Diagnosis not present

## 2018-11-22 ENCOUNTER — Other Ambulatory Visit (INDEPENDENT_AMBULATORY_CARE_PROVIDER_SITE_OTHER): Payer: Medicare Other

## 2018-11-22 ENCOUNTER — Encounter: Payer: Self-pay | Admitting: Internal Medicine

## 2018-11-22 ENCOUNTER — Ambulatory Visit (INDEPENDENT_AMBULATORY_CARE_PROVIDER_SITE_OTHER): Payer: Medicare Other | Admitting: Internal Medicine

## 2018-11-22 DIAGNOSIS — E11319 Type 2 diabetes mellitus with unspecified diabetic retinopathy without macular edema: Secondary | ICD-10-CM

## 2018-11-22 DIAGNOSIS — I10 Essential (primary) hypertension: Secondary | ICD-10-CM

## 2018-11-22 DIAGNOSIS — I480 Paroxysmal atrial fibrillation: Secondary | ICD-10-CM | POA: Diagnosis not present

## 2018-11-22 DIAGNOSIS — E538 Deficiency of other specified B group vitamins: Secondary | ICD-10-CM

## 2018-11-22 DIAGNOSIS — I2510 Atherosclerotic heart disease of native coronary artery without angina pectoris: Secondary | ICD-10-CM | POA: Diagnosis not present

## 2018-11-22 DIAGNOSIS — R234 Changes in skin texture: Secondary | ICD-10-CM | POA: Diagnosis not present

## 2018-11-22 DIAGNOSIS — IMO0002 Reserved for concepts with insufficient information to code with codable children: Secondary | ICD-10-CM

## 2018-11-22 DIAGNOSIS — E1165 Type 2 diabetes mellitus with hyperglycemia: Secondary | ICD-10-CM

## 2018-11-22 LAB — BASIC METABOLIC PANEL
BUN: 18 mg/dL (ref 6–23)
CHLORIDE: 101 meq/L (ref 96–112)
CO2: 31 mEq/L (ref 19–32)
CREATININE: 0.82 mg/dL (ref 0.40–1.50)
Calcium: 9.3 mg/dL (ref 8.4–10.5)
GFR: 96.77 mL/min (ref >60.00)
GLUCOSE: 212 mg/dL — AB (ref 70–99)
Potassium: 4.3 mEq/L (ref 3.5–5.1)
Sodium: 138 mEq/L (ref 135–145)

## 2018-11-22 LAB — HEMOGLOBIN A1C: HEMOGLOBIN A1C: 6.8 % — AB (ref 4.6–6.5)

## 2018-11-22 NOTE — Assessment & Plan Note (Signed)
Losartan, Lopressor, Lasix

## 2018-11-22 NOTE — Progress Notes (Signed)
Subjective:  Patient ID: Charles [name], male    DOB: Oct 23, 1941  Age: 77 y.o. MRN: [id number]  CC: No chief complaint on file.   HPI DEVELLE SIEVERS presents for DM, HTN, CAD f/u  Outpatient Medications Prior to Visit  Medication Sig Dispense Refill  . acetaminophen (TYLENOL) 500 MG tablet Take 1,000 mg by mouth at bedtime.     Marland Kitchen aspirin EC 81 MG tablet Take 81 mg by mouth daily.     . cholecalciferol (VITAMIN D) 1000 units tablet Take 1,000 Units by mouth daily.     . clopidogrel (PLAVIX) 75 MG tablet TAKE 1 TABLET EVERY DAY 90 tablet 3  . collagenase (SANTYL) ointment Apply 1 application topically daily. 15 g 3  . Fluticasone-Salmeterol (ADVAIR DISKUS) 100-50 MCG/DOSE AEPB Inhale 1 puff into the lungs 2 (two) times daily. 3 each 3  . furosemide (LASIX) 80 MG tablet TAKE 1/2 TABLET EVERY OTHER DAILY ALTERNATING WITH 1 TABLET EVERY OTHER DAY 68 tablet 3  . gabapentin (NEURONTIN) 300 MG capsule TAKE 1 CAPSULE THREE TIMES DAILY 270 capsule 3  . HYDROcodone-acetaminophen (NORCO/VICODIN) 5-325 MG tablet Take 1-2 tablets by mouth every 6 (six) hours as needed. 10 tablet 0  . insulin aspart (NOVOLOG) 100 UNIT/ML FlexPen Inject 20 Units into the skin 4 (four) times daily - after meals and at bedtime. Follow SS 15 pen 3  . isosorbide mononitrate (IMDUR) 60 MG 24 hr tablet TAKE 1 TABLET EVERY DAY 90 tablet 3  . metFORMIN (GLUCOPHAGE) 500 MG tablet TAKE 1 TABLET TWICE DAILY WITH A MEAL 180 tablet 3  . metoprolol tartrate (LOPRESSOR) 25 MG tablet Take 1 tablet (25 mg total) by mouth 2 (two) times daily. 180 tablet 3  . nitroGLYCERIN (NITROSTAT) 0.4 MG SL tablet Place 1 tablet (0.4 mg total) under the tongue every 5 (five) minutes as needed (up to 3 doses). For chest pain 60 tablet 3  . pioglitazone (ACTOS) 45 MG tablet TAKE 1 TABLET EVERY DAY 90 tablet 3  . potassium chloride SA (K-DUR,KLOR-CON) 20 MEQ tablet Take 1 tablet (20 mEq total) by mouth daily. 90 tablet 3  . pravastatin (PRAVACHOL) 40 MG  tablet TAKE 1 TABLET EVERY DAY 90 tablet 3  . ranolazine (RANEXA) 500 MG 12 hr tablet TAKE 1 TABLET TWICE DAILY 180 tablet 3  . triamcinolone cream (KENALOG) 0.5 % Apply 1 application topically 2 (two) times daily as needed (rash). 30 g 3  . vitamin B-12 (CYANOCOBALAMIN) 1000 MCG tablet Take 1,000 mcg by mouth daily.      No facility-administered medications prior to visit.     ROS: Review of Systems  Constitutional: Negative for appetite change, fatigue and unexpected weight change.  HENT: Negative for congestion, nosebleeds, sneezing, sore throat and trouble swallowing.   Eyes: Negative for itching and visual disturbance.  Respiratory: Negative for cough.   Cardiovascular: Negative for chest pain, palpitations and leg swelling.  Gastrointestinal: Negative for abdominal distention, blood in stool, diarrhea and nausea.  Genitourinary: Negative for frequency and hematuria.  Musculoskeletal: Positive for arthralgias, back pain and gait problem. Negative for joint swelling and neck pain.  Skin: Negative for rash.  Neurological: Negative for dizziness, tremors, speech difficulty and weakness.  Psychiatric/Behavioral: Positive for decreased concentration. Negative for agitation, dysphoric mood, sleep disturbance and suicidal ideas. The patient is not nervous/anxious.     Objective:  BP (!) 126/58 (BP Location: Right Arm, Patient Position: Sitting, Cuff Size: Large)   Pulse 74   Temp Marland Kitchen)  97.5 F (36.4 C) (Oral)   Ht 6\' 4"  (1.93 m)   Wt (!) 334 lb (151.5 kg)   SpO2 97%   BMI 40.66 kg/m   BP Readings from Last 3 Encounters:  11/22/18 (!) 126/58  08/23/18 126/72  08/13/18 120/63    Wt Readings from Last 3 Encounters:  11/22/18 (!) 334 lb (151.5 kg)  08/23/18 (!) 325 lb (147.4 kg)  08/12/18 (!) 319 lb 0.1 oz (144.7 kg)    Physical Exam  Constitutional: He is oriented to person, place, and time. He appears well-developed. No distress.  NAD  HENT:  Mouth/Throat: Oropharynx is  clear and moist.  Eyes: Pupils are equal, round, and reactive to light. Conjunctivae are normal.  Neck: Normal range of motion. No JVD present. No thyromegaly present.  Cardiovascular: Normal rate, regular rhythm, normal heart sounds and intact distal pulses. Exam reveals no gallop and no friction rub.  No murmur heard. Pulmonary/Chest: Effort normal and breath sounds normal. No respiratory distress. He has no wheezes. He has no rales. He exhibits no tenderness.  Abdominal: Soft. Bowel sounds are normal. He exhibits no distension and no mass. There is no tenderness. There is no rebound and no guarding.  Musculoskeletal: Normal range of motion. He exhibits tenderness. He exhibits no edema.  Lymphadenopathy:    He has no cervical adenopathy.  Neurological: He is alert and oriented to person, place, and time. He has normal reflexes. No cranial nerve deficit. He exhibits normal muscle tone. He displays a negative Romberg sign. Coordination and gait normal.  Skin: Skin is warm and dry. No rash noted.  Psychiatric: He has a normal mood and affect. His behavior is normal. Judgment and thought content normal.  in a w/c L elbow w/a wound 2 mm/callus 7 mm Scaly skin on legs Atrophic hand muscles  Lab Results  Component Value Date   WBC 6.6 08/12/2018   HGB 11.8 (L) 08/12/2018   HCT 37.4 (L) 08/12/2018   PLT 228 08/12/2018   GLUCOSE 172 (H) 08/23/2018   CHOL 131 06/01/2017   TRIG 31 06/01/2017   HDL 45 06/01/2017   LDLCALC 80 06/01/2017   ALT 7 03/10/2017   AST 15 03/10/2017   NA 136 08/23/2018   K 4.2 08/23/2018   CL 101 08/23/2018   CREATININE 0.76 08/23/2018   BUN 20 08/23/2018   CO2 28 08/23/2018   TSH 2.584 06/01/2017   PSA 0.96 08/28/2010   INR 1.06 06/01/2017   HGBA1C 7.7 (H) 08/23/2018   MICROALBUR 1.4 08/28/2010    Dg Elbow Complete Left  Result Date: 08/12/2018 CLINICAL DATA:  77 year old male with history of trauma from a fall complaining of left elbow pain. EXAM: LEFT  ELBOW - COMPLETE 3+ VIEW COMPARISON:  None. FINDINGS: There is no evidence of fracture, dislocation, or joint effusion. There is no evidence of arthropathy or other focal bone abnormality. Soft tissues are unremarkable. IMPRESSION: Negative. Electronically Signed   By: Vinnie Langton M.D.   On: 08/12/2018 22:15   Dg Shoulder Left  Result Date: 08/12/2018 CLINICAL DATA:  Patient status post fall.  Initial encounter. EXAM: LEFT SHOULDER - 2+ VIEW COMPARISON:  None. FINDINGS: Mildly angulated transverse fracture through the proximal left humerus. Humeral head appears located. Visualized left hemithorax is unremarkable. IMPRESSION: Mildly displaced transverse fracture through the proximal left humerus. Electronically Signed   By: Lovey Newcomer M.D.   On: 08/12/2018 22:15   Dg Humerus Left  Result Date: 08/12/2018 CLINICAL DATA:  77 year old male  with history of trauma from a fall complaining of pain in the left shoulder. EXAM: LEFT HUMERUS - 2+ VIEW COMPARISON:  No priors. FINDINGS: Horizontal fracture through the humeral neck which appears mildly impacted. Mild varus rotation. Distal aspect of the humerus is otherwise intact. IMPRESSION: 1. Horizontal fracture through the humeral neck with mild varus rotation and mild impaction. Electronically Signed   By: Vinnie Langton M.D.   On: 08/12/2018 22:18    Assessment & Plan:   There are no diagnoses linked to this encounter.   No orders of the defined types were placed in this encounter.    Follow-up: No follow-ups on file.  [name], MD

## 2018-11-22 NOTE — Patient Instructions (Signed)
Try GLYTONE exfoliating body lotion by Pierre Fabre (free acid value 17.5)  

## 2018-11-22 NOTE — Assessment & Plan Note (Signed)
Metformin, Actos, SS Novolog pen

## 2018-11-22 NOTE — Assessment & Plan Note (Signed)
On Rx: Losartan, Lopressor, Lasix, Pravachol, Plavix, ASA, Isosorbide, Ranexa

## 2018-11-22 NOTE — Assessment & Plan Note (Signed)
On B12 

## 2018-11-22 NOTE — Assessment & Plan Note (Signed)
Lopressor, Plavix, ASA Go on a low carb diet

## 2018-11-22 NOTE — Assessment & Plan Note (Signed)
Try GLYTONE exfoliating body lotion by Pierre Fabre (free acid value 17.5)  

## 2018-11-23 DIAGNOSIS — I10 Essential (primary) hypertension: Secondary | ICD-10-CM | POA: Diagnosis not present

## 2018-11-23 DIAGNOSIS — E538 Deficiency of other specified B group vitamins: Secondary | ICD-10-CM | POA: Diagnosis not present

## 2018-11-23 DIAGNOSIS — I2510 Atherosclerotic heart disease of native coronary artery without angina pectoris: Secondary | ICD-10-CM | POA: Diagnosis not present

## 2018-11-23 DIAGNOSIS — I480 Paroxysmal atrial fibrillation: Secondary | ICD-10-CM | POA: Diagnosis not present

## 2018-11-23 DIAGNOSIS — S42202D Unspecified fracture of upper end of left humerus, subsequent encounter for fracture with routine healing: Secondary | ICD-10-CM | POA: Diagnosis not present

## 2018-11-23 DIAGNOSIS — E11319 Type 2 diabetes mellitus with unspecified diabetic retinopathy without macular edema: Secondary | ICD-10-CM | POA: Diagnosis not present

## 2018-11-28 DIAGNOSIS — I480 Paroxysmal atrial fibrillation: Secondary | ICD-10-CM | POA: Diagnosis not present

## 2018-11-28 DIAGNOSIS — I10 Essential (primary) hypertension: Secondary | ICD-10-CM | POA: Diagnosis not present

## 2018-11-28 DIAGNOSIS — E11319 Type 2 diabetes mellitus with unspecified diabetic retinopathy without macular edema: Secondary | ICD-10-CM | POA: Diagnosis not present

## 2018-11-28 DIAGNOSIS — E538 Deficiency of other specified B group vitamins: Secondary | ICD-10-CM | POA: Diagnosis not present

## 2018-11-28 DIAGNOSIS — I2510 Atherosclerotic heart disease of native coronary artery without angina pectoris: Secondary | ICD-10-CM | POA: Diagnosis not present

## 2018-11-28 DIAGNOSIS — S42202D Unspecified fracture of upper end of left humerus, subsequent encounter for fracture with routine healing: Secondary | ICD-10-CM | POA: Diagnosis not present

## 2018-11-30 DIAGNOSIS — E11319 Type 2 diabetes mellitus with unspecified diabetic retinopathy without macular edema: Secondary | ICD-10-CM | POA: Diagnosis not present

## 2018-11-30 DIAGNOSIS — E538 Deficiency of other specified B group vitamins: Secondary | ICD-10-CM | POA: Diagnosis not present

## 2018-11-30 DIAGNOSIS — S42202D Unspecified fracture of upper end of left humerus, subsequent encounter for fracture with routine healing: Secondary | ICD-10-CM | POA: Diagnosis not present

## 2018-11-30 DIAGNOSIS — I480 Paroxysmal atrial fibrillation: Secondary | ICD-10-CM | POA: Diagnosis not present

## 2018-11-30 DIAGNOSIS — I2510 Atherosclerotic heart disease of native coronary artery without angina pectoris: Secondary | ICD-10-CM | POA: Diagnosis not present

## 2018-11-30 DIAGNOSIS — I10 Essential (primary) hypertension: Secondary | ICD-10-CM | POA: Diagnosis not present

## 2018-12-01 ENCOUNTER — Encounter (HOSPITAL_BASED_OUTPATIENT_CLINIC_OR_DEPARTMENT_OTHER): Payer: Medicare Other | Attending: Internal Medicine

## 2018-12-01 DIAGNOSIS — E538 Deficiency of other specified B group vitamins: Secondary | ICD-10-CM | POA: Diagnosis not present

## 2018-12-01 DIAGNOSIS — I10 Essential (primary) hypertension: Secondary | ICD-10-CM | POA: Diagnosis not present

## 2018-12-01 DIAGNOSIS — E11319 Type 2 diabetes mellitus with unspecified diabetic retinopathy without macular edema: Secondary | ICD-10-CM | POA: Diagnosis not present

## 2018-12-01 DIAGNOSIS — S42202D Unspecified fracture of upper end of left humerus, subsequent encounter for fracture with routine healing: Secondary | ICD-10-CM | POA: Diagnosis not present

## 2018-12-01 DIAGNOSIS — I480 Paroxysmal atrial fibrillation: Secondary | ICD-10-CM | POA: Diagnosis not present

## 2018-12-01 DIAGNOSIS — I2510 Atherosclerotic heart disease of native coronary artery without angina pectoris: Secondary | ICD-10-CM | POA: Diagnosis not present

## 2018-12-04 DIAGNOSIS — I480 Paroxysmal atrial fibrillation: Secondary | ICD-10-CM | POA: Diagnosis not present

## 2018-12-04 DIAGNOSIS — S42202D Unspecified fracture of upper end of left humerus, subsequent encounter for fracture with routine healing: Secondary | ICD-10-CM | POA: Diagnosis not present

## 2018-12-04 DIAGNOSIS — E538 Deficiency of other specified B group vitamins: Secondary | ICD-10-CM | POA: Diagnosis not present

## 2018-12-04 DIAGNOSIS — E11319 Type 2 diabetes mellitus with unspecified diabetic retinopathy without macular edema: Secondary | ICD-10-CM | POA: Diagnosis not present

## 2018-12-04 DIAGNOSIS — I2510 Atherosclerotic heart disease of native coronary artery without angina pectoris: Secondary | ICD-10-CM | POA: Diagnosis not present

## 2018-12-04 DIAGNOSIS — I10 Essential (primary) hypertension: Secondary | ICD-10-CM | POA: Diagnosis not present

## 2018-12-07 DIAGNOSIS — E538 Deficiency of other specified B group vitamins: Secondary | ICD-10-CM | POA: Diagnosis not present

## 2018-12-07 DIAGNOSIS — I10 Essential (primary) hypertension: Secondary | ICD-10-CM | POA: Diagnosis not present

## 2018-12-07 DIAGNOSIS — I2510 Atherosclerotic heart disease of native coronary artery without angina pectoris: Secondary | ICD-10-CM | POA: Diagnosis not present

## 2018-12-07 DIAGNOSIS — E11319 Type 2 diabetes mellitus with unspecified diabetic retinopathy without macular edema: Secondary | ICD-10-CM | POA: Diagnosis not present

## 2018-12-07 DIAGNOSIS — S42202D Unspecified fracture of upper end of left humerus, subsequent encounter for fracture with routine healing: Secondary | ICD-10-CM | POA: Diagnosis not present

## 2018-12-07 DIAGNOSIS — I480 Paroxysmal atrial fibrillation: Secondary | ICD-10-CM | POA: Diagnosis not present

## 2018-12-08 DIAGNOSIS — S42202D Unspecified fracture of upper end of left humerus, subsequent encounter for fracture with routine healing: Secondary | ICD-10-CM | POA: Diagnosis not present

## 2018-12-08 DIAGNOSIS — I480 Paroxysmal atrial fibrillation: Secondary | ICD-10-CM | POA: Diagnosis not present

## 2018-12-08 DIAGNOSIS — Z89412 Acquired absence of left great toe: Secondary | ICD-10-CM | POA: Diagnosis not present

## 2018-12-08 DIAGNOSIS — Z794 Long term (current) use of insulin: Secondary | ICD-10-CM | POA: Diagnosis not present

## 2018-12-08 DIAGNOSIS — I2510 Atherosclerotic heart disease of native coronary artery without angina pectoris: Secondary | ICD-10-CM | POA: Diagnosis not present

## 2018-12-08 DIAGNOSIS — I10 Essential (primary) hypertension: Secondary | ICD-10-CM | POA: Diagnosis not present

## 2018-12-08 DIAGNOSIS — Z79891 Long term (current) use of opiate analgesic: Secondary | ICD-10-CM | POA: Diagnosis not present

## 2018-12-08 DIAGNOSIS — Z7902 Long term (current) use of antithrombotics/antiplatelets: Secondary | ICD-10-CM | POA: Diagnosis not present

## 2018-12-08 DIAGNOSIS — Z89431 Acquired absence of right foot: Secondary | ICD-10-CM | POA: Diagnosis not present

## 2018-12-08 DIAGNOSIS — Z9181 History of falling: Secondary | ICD-10-CM | POA: Diagnosis not present

## 2018-12-08 DIAGNOSIS — Z7982 Long term (current) use of aspirin: Secondary | ICD-10-CM | POA: Diagnosis not present

## 2018-12-08 DIAGNOSIS — E538 Deficiency of other specified B group vitamins: Secondary | ICD-10-CM | POA: Diagnosis not present

## 2018-12-08 DIAGNOSIS — E11319 Type 2 diabetes mellitus with unspecified diabetic retinopathy without macular edema: Secondary | ICD-10-CM | POA: Diagnosis not present

## 2018-12-12 DIAGNOSIS — I480 Paroxysmal atrial fibrillation: Secondary | ICD-10-CM | POA: Diagnosis not present

## 2018-12-12 DIAGNOSIS — S42202D Unspecified fracture of upper end of left humerus, subsequent encounter for fracture with routine healing: Secondary | ICD-10-CM | POA: Diagnosis not present

## 2018-12-12 DIAGNOSIS — E11319 Type 2 diabetes mellitus with unspecified diabetic retinopathy without macular edema: Secondary | ICD-10-CM | POA: Diagnosis not present

## 2018-12-12 DIAGNOSIS — E538 Deficiency of other specified B group vitamins: Secondary | ICD-10-CM | POA: Diagnosis not present

## 2018-12-12 DIAGNOSIS — I2510 Atherosclerotic heart disease of native coronary artery without angina pectoris: Secondary | ICD-10-CM | POA: Diagnosis not present

## 2018-12-12 DIAGNOSIS — I10 Essential (primary) hypertension: Secondary | ICD-10-CM | POA: Diagnosis not present

## 2018-12-14 DIAGNOSIS — I10 Essential (primary) hypertension: Secondary | ICD-10-CM | POA: Diagnosis not present

## 2018-12-14 DIAGNOSIS — E538 Deficiency of other specified B group vitamins: Secondary | ICD-10-CM | POA: Diagnosis not present

## 2018-12-14 DIAGNOSIS — E11319 Type 2 diabetes mellitus with unspecified diabetic retinopathy without macular edema: Secondary | ICD-10-CM | POA: Diagnosis not present

## 2018-12-14 DIAGNOSIS — I480 Paroxysmal atrial fibrillation: Secondary | ICD-10-CM | POA: Diagnosis not present

## 2018-12-14 DIAGNOSIS — S42202D Unspecified fracture of upper end of left humerus, subsequent encounter for fracture with routine healing: Secondary | ICD-10-CM | POA: Diagnosis not present

## 2018-12-14 DIAGNOSIS — I2510 Atherosclerotic heart disease of native coronary artery without angina pectoris: Secondary | ICD-10-CM | POA: Diagnosis not present

## 2018-12-19 DIAGNOSIS — E538 Deficiency of other specified B group vitamins: Secondary | ICD-10-CM | POA: Diagnosis not present

## 2018-12-19 DIAGNOSIS — S42202D Unspecified fracture of upper end of left humerus, subsequent encounter for fracture with routine healing: Secondary | ICD-10-CM | POA: Diagnosis not present

## 2018-12-19 DIAGNOSIS — E11319 Type 2 diabetes mellitus with unspecified diabetic retinopathy without macular edema: Secondary | ICD-10-CM | POA: Diagnosis not present

## 2018-12-19 DIAGNOSIS — I480 Paroxysmal atrial fibrillation: Secondary | ICD-10-CM | POA: Diagnosis not present

## 2018-12-19 DIAGNOSIS — I2510 Atherosclerotic heart disease of native coronary artery without angina pectoris: Secondary | ICD-10-CM | POA: Diagnosis not present

## 2018-12-19 DIAGNOSIS — I10 Essential (primary) hypertension: Secondary | ICD-10-CM | POA: Diagnosis not present

## 2018-12-21 DIAGNOSIS — I10 Essential (primary) hypertension: Secondary | ICD-10-CM | POA: Diagnosis not present

## 2018-12-21 DIAGNOSIS — I2510 Atherosclerotic heart disease of native coronary artery without angina pectoris: Secondary | ICD-10-CM | POA: Diagnosis not present

## 2018-12-21 DIAGNOSIS — E11319 Type 2 diabetes mellitus with unspecified diabetic retinopathy without macular edema: Secondary | ICD-10-CM | POA: Diagnosis not present

## 2018-12-21 DIAGNOSIS — E538 Deficiency of other specified B group vitamins: Secondary | ICD-10-CM | POA: Diagnosis not present

## 2018-12-21 DIAGNOSIS — I480 Paroxysmal atrial fibrillation: Secondary | ICD-10-CM | POA: Diagnosis not present

## 2018-12-21 DIAGNOSIS — S42202D Unspecified fracture of upper end of left humerus, subsequent encounter for fracture with routine healing: Secondary | ICD-10-CM | POA: Diagnosis not present

## 2018-12-26 DIAGNOSIS — I10 Essential (primary) hypertension: Secondary | ICD-10-CM | POA: Diagnosis not present

## 2018-12-26 DIAGNOSIS — I480 Paroxysmal atrial fibrillation: Secondary | ICD-10-CM | POA: Diagnosis not present

## 2018-12-26 DIAGNOSIS — E538 Deficiency of other specified B group vitamins: Secondary | ICD-10-CM | POA: Diagnosis not present

## 2018-12-26 DIAGNOSIS — E11319 Type 2 diabetes mellitus with unspecified diabetic retinopathy without macular edema: Secondary | ICD-10-CM | POA: Diagnosis not present

## 2018-12-26 DIAGNOSIS — I2510 Atherosclerotic heart disease of native coronary artery without angina pectoris: Secondary | ICD-10-CM | POA: Diagnosis not present

## 2018-12-26 DIAGNOSIS — S42202D Unspecified fracture of upper end of left humerus, subsequent encounter for fracture with routine healing: Secondary | ICD-10-CM | POA: Diagnosis not present

## 2018-12-28 DIAGNOSIS — S42202D Unspecified fracture of upper end of left humerus, subsequent encounter for fracture with routine healing: Secondary | ICD-10-CM | POA: Diagnosis not present

## 2018-12-28 DIAGNOSIS — I2510 Atherosclerotic heart disease of native coronary artery without angina pectoris: Secondary | ICD-10-CM | POA: Diagnosis not present

## 2018-12-28 DIAGNOSIS — I480 Paroxysmal atrial fibrillation: Secondary | ICD-10-CM | POA: Diagnosis not present

## 2018-12-28 DIAGNOSIS — E538 Deficiency of other specified B group vitamins: Secondary | ICD-10-CM | POA: Diagnosis not present

## 2018-12-28 DIAGNOSIS — I10 Essential (primary) hypertension: Secondary | ICD-10-CM | POA: Diagnosis not present

## 2018-12-28 DIAGNOSIS — E11319 Type 2 diabetes mellitus with unspecified diabetic retinopathy without macular edema: Secondary | ICD-10-CM | POA: Diagnosis not present

## 2018-12-29 DIAGNOSIS — M25512 Pain in left shoulder: Secondary | ICD-10-CM | POA: Diagnosis not present

## 2019-01-01 DIAGNOSIS — E538 Deficiency of other specified B group vitamins: Secondary | ICD-10-CM | POA: Diagnosis not present

## 2019-01-01 DIAGNOSIS — S42202D Unspecified fracture of upper end of left humerus, subsequent encounter for fracture with routine healing: Secondary | ICD-10-CM | POA: Diagnosis not present

## 2019-01-01 DIAGNOSIS — I480 Paroxysmal atrial fibrillation: Secondary | ICD-10-CM | POA: Diagnosis not present

## 2019-01-01 DIAGNOSIS — I10 Essential (primary) hypertension: Secondary | ICD-10-CM | POA: Diagnosis not present

## 2019-01-01 DIAGNOSIS — I2510 Atherosclerotic heart disease of native coronary artery without angina pectoris: Secondary | ICD-10-CM | POA: Diagnosis not present

## 2019-01-01 DIAGNOSIS — E11319 Type 2 diabetes mellitus with unspecified diabetic retinopathy without macular edema: Secondary | ICD-10-CM | POA: Diagnosis not present

## 2019-01-02 DIAGNOSIS — S42202D Unspecified fracture of upper end of left humerus, subsequent encounter for fracture with routine healing: Secondary | ICD-10-CM | POA: Diagnosis not present

## 2019-01-02 DIAGNOSIS — E11319 Type 2 diabetes mellitus with unspecified diabetic retinopathy without macular edema: Secondary | ICD-10-CM | POA: Diagnosis not present

## 2019-01-02 DIAGNOSIS — I480 Paroxysmal atrial fibrillation: Secondary | ICD-10-CM | POA: Diagnosis not present

## 2019-01-02 DIAGNOSIS — I10 Essential (primary) hypertension: Secondary | ICD-10-CM | POA: Diagnosis not present

## 2019-01-02 DIAGNOSIS — E538 Deficiency of other specified B group vitamins: Secondary | ICD-10-CM | POA: Diagnosis not present

## 2019-01-02 DIAGNOSIS — I2510 Atherosclerotic heart disease of native coronary artery without angina pectoris: Secondary | ICD-10-CM | POA: Diagnosis not present

## 2019-01-04 ENCOUNTER — Telehealth: Payer: Self-pay | Admitting: Internal Medicine

## 2019-01-04 DIAGNOSIS — I10 Essential (primary) hypertension: Secondary | ICD-10-CM | POA: Diagnosis not present

## 2019-01-04 DIAGNOSIS — I2510 Atherosclerotic heart disease of native coronary artery without angina pectoris: Secondary | ICD-10-CM | POA: Diagnosis not present

## 2019-01-04 DIAGNOSIS — E538 Deficiency of other specified B group vitamins: Secondary | ICD-10-CM | POA: Diagnosis not present

## 2019-01-04 DIAGNOSIS — I480 Paroxysmal atrial fibrillation: Secondary | ICD-10-CM | POA: Diagnosis not present

## 2019-01-04 DIAGNOSIS — E11319 Type 2 diabetes mellitus with unspecified diabetic retinopathy without macular edema: Secondary | ICD-10-CM | POA: Diagnosis not present

## 2019-01-04 DIAGNOSIS — S42202D Unspecified fracture of upper end of left humerus, subsequent encounter for fracture with routine healing: Secondary | ICD-10-CM | POA: Diagnosis not present

## 2019-01-04 NOTE — Telephone Encounter (Signed)
Per dr Alain Marion, patient has hx of stroke, he should really go to ED to be checked for possible stroke---pt wife advised of dr plotnikovs instructions---I have suggested she can try going to ED at [facility] which should not have a long wait like other ED's usually do---gave address and directions, wife will try to get patient to agree to go

## 2019-01-04 NOTE — Telephone Encounter (Signed)
fyi

## 2019-01-04 NOTE — Telephone Encounter (Signed)
Copied from [place] [id number]. Topic: Quick Communication - Home Health Verbal Orders >> Jan 04, 2019 11:53 AM Ivar Drape wrote: Caller/Agency:   Izora Gala OT w/[facility] Callback Number:  [phone]  Wanted the doctor to know that the patient has been having word finding problems since Sunday, and slurred speech.

## 2019-01-04 NOTE — Telephone Encounter (Signed)
Spoke with pt's wife. States pt has no other symptoms relating to stroke. Denies facial dropping, weakness, high BP, HR, o2 sat. Denies urinary symptoms. Pt states that he does not want to go to the ED that they will not be able to help him. Pt is fully aware that he is unable to find his words and has told his wife and son that. This started on Tuesday and hase been on and off. Pt has been seen by in home PT and OT. Advised wife that I would send message to Dr Alain Marion and have someone reach out to her tomorrow. If things were to get worse over night he would need to go to ED. Pts wife understands.

## 2019-01-05 NOTE — Telephone Encounter (Signed)
Agree  Thank you

## 2019-01-09 DIAGNOSIS — I480 Paroxysmal atrial fibrillation: Secondary | ICD-10-CM | POA: Diagnosis not present

## 2019-01-09 DIAGNOSIS — E538 Deficiency of other specified B group vitamins: Secondary | ICD-10-CM | POA: Diagnosis not present

## 2019-01-09 DIAGNOSIS — I2510 Atherosclerotic heart disease of native coronary artery without angina pectoris: Secondary | ICD-10-CM | POA: Diagnosis not present

## 2019-01-09 DIAGNOSIS — E11319 Type 2 diabetes mellitus with unspecified diabetic retinopathy without macular edema: Secondary | ICD-10-CM | POA: Diagnosis not present

## 2019-01-09 DIAGNOSIS — I10 Essential (primary) hypertension: Secondary | ICD-10-CM | POA: Diagnosis not present

## 2019-01-09 DIAGNOSIS — S42202D Unspecified fracture of upper end of left humerus, subsequent encounter for fracture with routine healing: Secondary | ICD-10-CM | POA: Diagnosis not present

## 2019-01-11 DIAGNOSIS — S42202D Unspecified fracture of upper end of left humerus, subsequent encounter for fracture with routine healing: Secondary | ICD-10-CM | POA: Diagnosis not present

## 2019-01-11 DIAGNOSIS — I10 Essential (primary) hypertension: Secondary | ICD-10-CM | POA: Diagnosis not present

## 2019-01-11 DIAGNOSIS — I2510 Atherosclerotic heart disease of native coronary artery without angina pectoris: Secondary | ICD-10-CM | POA: Diagnosis not present

## 2019-01-11 DIAGNOSIS — E11319 Type 2 diabetes mellitus with unspecified diabetic retinopathy without macular edema: Secondary | ICD-10-CM | POA: Diagnosis not present

## 2019-01-11 DIAGNOSIS — I480 Paroxysmal atrial fibrillation: Secondary | ICD-10-CM | POA: Diagnosis not present

## 2019-01-11 DIAGNOSIS — E538 Deficiency of other specified B group vitamins: Secondary | ICD-10-CM | POA: Diagnosis not present

## 2019-01-13 DIAGNOSIS — E11319 Type 2 diabetes mellitus with unspecified diabetic retinopathy without macular edema: Secondary | ICD-10-CM | POA: Diagnosis not present

## 2019-01-13 DIAGNOSIS — I2510 Atherosclerotic heart disease of native coronary artery without angina pectoris: Secondary | ICD-10-CM | POA: Diagnosis not present

## 2019-01-13 DIAGNOSIS — I480 Paroxysmal atrial fibrillation: Secondary | ICD-10-CM | POA: Diagnosis not present

## 2019-01-13 DIAGNOSIS — S42202D Unspecified fracture of upper end of left humerus, subsequent encounter for fracture with routine healing: Secondary | ICD-10-CM | POA: Diagnosis not present

## 2019-01-13 DIAGNOSIS — I10 Essential (primary) hypertension: Secondary | ICD-10-CM | POA: Diagnosis not present

## 2019-01-13 DIAGNOSIS — E538 Deficiency of other specified B group vitamins: Secondary | ICD-10-CM | POA: Diagnosis not present

## 2019-01-16 DIAGNOSIS — I10 Essential (primary) hypertension: Secondary | ICD-10-CM | POA: Diagnosis not present

## 2019-01-16 DIAGNOSIS — E11319 Type 2 diabetes mellitus with unspecified diabetic retinopathy without macular edema: Secondary | ICD-10-CM | POA: Diagnosis not present

## 2019-01-16 DIAGNOSIS — E538 Deficiency of other specified B group vitamins: Secondary | ICD-10-CM | POA: Diagnosis not present

## 2019-01-16 DIAGNOSIS — S42202D Unspecified fracture of upper end of left humerus, subsequent encounter for fracture with routine healing: Secondary | ICD-10-CM | POA: Diagnosis not present

## 2019-01-16 DIAGNOSIS — I480 Paroxysmal atrial fibrillation: Secondary | ICD-10-CM | POA: Diagnosis not present

## 2019-01-16 DIAGNOSIS — I2510 Atherosclerotic heart disease of native coronary artery without angina pectoris: Secondary | ICD-10-CM | POA: Diagnosis not present

## 2019-01-18 DIAGNOSIS — I2510 Atherosclerotic heart disease of native coronary artery without angina pectoris: Secondary | ICD-10-CM | POA: Diagnosis not present

## 2019-01-18 DIAGNOSIS — I10 Essential (primary) hypertension: Secondary | ICD-10-CM | POA: Diagnosis not present

## 2019-01-18 DIAGNOSIS — I480 Paroxysmal atrial fibrillation: Secondary | ICD-10-CM | POA: Diagnosis not present

## 2019-01-18 DIAGNOSIS — E11319 Type 2 diabetes mellitus with unspecified diabetic retinopathy without macular edema: Secondary | ICD-10-CM | POA: Diagnosis not present

## 2019-01-18 DIAGNOSIS — E538 Deficiency of other specified B group vitamins: Secondary | ICD-10-CM | POA: Diagnosis not present

## 2019-01-18 DIAGNOSIS — S42202D Unspecified fracture of upper end of left humerus, subsequent encounter for fracture with routine healing: Secondary | ICD-10-CM | POA: Diagnosis not present

## 2019-01-23 DIAGNOSIS — E11319 Type 2 diabetes mellitus with unspecified diabetic retinopathy without macular edema: Secondary | ICD-10-CM | POA: Diagnosis not present

## 2019-01-23 DIAGNOSIS — E538 Deficiency of other specified B group vitamins: Secondary | ICD-10-CM | POA: Diagnosis not present

## 2019-01-23 DIAGNOSIS — S42202D Unspecified fracture of upper end of left humerus, subsequent encounter for fracture with routine healing: Secondary | ICD-10-CM | POA: Diagnosis not present

## 2019-01-23 DIAGNOSIS — I2510 Atherosclerotic heart disease of native coronary artery without angina pectoris: Secondary | ICD-10-CM | POA: Diagnosis not present

## 2019-01-23 DIAGNOSIS — I480 Paroxysmal atrial fibrillation: Secondary | ICD-10-CM | POA: Diagnosis not present

## 2019-01-23 DIAGNOSIS — I10 Essential (primary) hypertension: Secondary | ICD-10-CM | POA: Diagnosis not present

## 2019-01-25 DIAGNOSIS — I10 Essential (primary) hypertension: Secondary | ICD-10-CM | POA: Diagnosis not present

## 2019-01-25 DIAGNOSIS — I480 Paroxysmal atrial fibrillation: Secondary | ICD-10-CM | POA: Diagnosis not present

## 2019-01-25 DIAGNOSIS — E538 Deficiency of other specified B group vitamins: Secondary | ICD-10-CM | POA: Diagnosis not present

## 2019-01-25 DIAGNOSIS — S42202D Unspecified fracture of upper end of left humerus, subsequent encounter for fracture with routine healing: Secondary | ICD-10-CM | POA: Diagnosis not present

## 2019-01-25 DIAGNOSIS — I2510 Atherosclerotic heart disease of native coronary artery without angina pectoris: Secondary | ICD-10-CM | POA: Diagnosis not present

## 2019-01-25 DIAGNOSIS — E11319 Type 2 diabetes mellitus with unspecified diabetic retinopathy without macular edema: Secondary | ICD-10-CM | POA: Diagnosis not present

## 2019-01-26 DIAGNOSIS — S42202D Unspecified fracture of upper end of left humerus, subsequent encounter for fracture with routine healing: Secondary | ICD-10-CM | POA: Diagnosis not present

## 2019-01-26 DIAGNOSIS — I480 Paroxysmal atrial fibrillation: Secondary | ICD-10-CM | POA: Diagnosis not present

## 2019-01-26 DIAGNOSIS — I10 Essential (primary) hypertension: Secondary | ICD-10-CM | POA: Diagnosis not present

## 2019-01-26 DIAGNOSIS — E538 Deficiency of other specified B group vitamins: Secondary | ICD-10-CM | POA: Diagnosis not present

## 2019-01-26 DIAGNOSIS — E11319 Type 2 diabetes mellitus with unspecified diabetic retinopathy without macular edema: Secondary | ICD-10-CM | POA: Diagnosis not present

## 2019-01-26 DIAGNOSIS — I2510 Atherosclerotic heart disease of native coronary artery without angina pectoris: Secondary | ICD-10-CM | POA: Diagnosis not present

## 2019-01-30 DIAGNOSIS — I10 Essential (primary) hypertension: Secondary | ICD-10-CM | POA: Diagnosis not present

## 2019-01-30 DIAGNOSIS — E538 Deficiency of other specified B group vitamins: Secondary | ICD-10-CM | POA: Diagnosis not present

## 2019-01-30 DIAGNOSIS — I2510 Atherosclerotic heart disease of native coronary artery without angina pectoris: Secondary | ICD-10-CM | POA: Diagnosis not present

## 2019-01-30 DIAGNOSIS — I480 Paroxysmal atrial fibrillation: Secondary | ICD-10-CM | POA: Diagnosis not present

## 2019-01-30 DIAGNOSIS — E11319 Type 2 diabetes mellitus with unspecified diabetic retinopathy without macular edema: Secondary | ICD-10-CM | POA: Diagnosis not present

## 2019-01-30 DIAGNOSIS — S42202D Unspecified fracture of upper end of left humerus, subsequent encounter for fracture with routine healing: Secondary | ICD-10-CM | POA: Diagnosis not present

## 2019-02-01 DIAGNOSIS — S42202D Unspecified fracture of upper end of left humerus, subsequent encounter for fracture with routine healing: Secondary | ICD-10-CM | POA: Diagnosis not present

## 2019-02-01 DIAGNOSIS — I2510 Atherosclerotic heart disease of native coronary artery without angina pectoris: Secondary | ICD-10-CM | POA: Diagnosis not present

## 2019-02-01 DIAGNOSIS — I10 Essential (primary) hypertension: Secondary | ICD-10-CM | POA: Diagnosis not present

## 2019-02-01 DIAGNOSIS — I480 Paroxysmal atrial fibrillation: Secondary | ICD-10-CM | POA: Diagnosis not present

## 2019-02-01 DIAGNOSIS — E11319 Type 2 diabetes mellitus with unspecified diabetic retinopathy without macular edema: Secondary | ICD-10-CM | POA: Diagnosis not present

## 2019-02-01 DIAGNOSIS — E538 Deficiency of other specified B group vitamins: Secondary | ICD-10-CM | POA: Diagnosis not present

## 2019-02-21 ENCOUNTER — Ambulatory Visit (INDEPENDENT_AMBULATORY_CARE_PROVIDER_SITE_OTHER): Payer: Medicare Other | Admitting: Internal Medicine

## 2019-02-21 ENCOUNTER — Other Ambulatory Visit: Payer: Self-pay

## 2019-02-21 ENCOUNTER — Encounter: Payer: Self-pay | Admitting: Internal Medicine

## 2019-02-21 ENCOUNTER — Other Ambulatory Visit (INDEPENDENT_AMBULATORY_CARE_PROVIDER_SITE_OTHER): Payer: Medicare Other

## 2019-02-21 DIAGNOSIS — Z8673 Personal history of transient ischemic attack (TIA), and cerebral infarction without residual deficits: Secondary | ICD-10-CM

## 2019-02-21 DIAGNOSIS — R234 Changes in skin texture: Secondary | ICD-10-CM | POA: Diagnosis not present

## 2019-02-21 DIAGNOSIS — E1165 Type 2 diabetes mellitus with hyperglycemia: Secondary | ICD-10-CM | POA: Diagnosis not present

## 2019-02-21 DIAGNOSIS — E538 Deficiency of other specified B group vitamins: Secondary | ICD-10-CM | POA: Diagnosis not present

## 2019-02-21 DIAGNOSIS — E11319 Type 2 diabetes mellitus with unspecified diabetic retinopathy without macular edema: Secondary | ICD-10-CM | POA: Diagnosis not present

## 2019-02-21 DIAGNOSIS — S42202S Unspecified fracture of upper end of left humerus, sequela: Secondary | ICD-10-CM

## 2019-02-21 DIAGNOSIS — IMO0002 Reserved for concepts with insufficient information to code with codable children: Secondary | ICD-10-CM

## 2019-02-21 DIAGNOSIS — R471 Dysarthria and anarthria: Secondary | ICD-10-CM

## 2019-02-21 LAB — BASIC METABOLIC PANEL
BUN: 25 mg/dL — ABNORMAL HIGH (ref 6–23)
CALCIUM: 9.1 mg/dL (ref 8.4–10.5)
CHLORIDE: 101 meq/L (ref 96–112)
CO2: 29 mEq/L (ref 19–32)
CREATININE: 1.01 mg/dL (ref 0.40–1.50)
GFR: 71.54 mL/min (ref >60.00)
GLUCOSE: 121 mg/dL — AB (ref 70–99)
POTASSIUM: 4 meq/L (ref 3.5–5.1)
SODIUM: 137 meq/L (ref 135–145)

## 2019-02-21 LAB — HEMOGLOBIN A1C: Hgb A1c MFr Bld: 7.4 % — ABNORMAL HIGH (ref 4.6–6.5)

## 2019-02-21 MED ORDER — TRIAMCINOLONE ACETONIDE 0.5 % EX OINT: 1.0000 "application " | TOPICAL_OINTMENT | Freq: Two times a day (BID) | CUTANEOUS | 0 refills | Status: DC

## 2019-02-21 NOTE — Progress Notes (Signed)
Subjective:  Patient ID: Charles [name], male    DOB: 1941-01-04  Age: 78 y.o. MRN: [id number]  CC: No chief complaint on file.   HPI URIYAH RASKA presents for DM, CVA, HTN. C/o a new expressive aphasia developed  Gradually in Feb 2020 --- hard time finding words...  Outpatient Medications Prior to Visit  Medication Sig Dispense Refill  . acetaminophen (TYLENOL) 500 MG tablet Take 1,000 mg by mouth at bedtime.     Marland Kitchen aspirin EC 81 MG tablet Take 81 mg by mouth daily.     . cholecalciferol (VITAMIN D) 1000 units tablet Take 1,000 Units by mouth daily.     . clopidogrel (PLAVIX) 75 MG tablet TAKE 1 TABLET EVERY DAY 90 tablet 3  . collagenase (SANTYL) ointment Apply 1 application topically daily. 15 g 3  . Fluticasone-Salmeterol (ADVAIR DISKUS) 100-50 MCG/DOSE AEPB Inhale 1 puff into the lungs 2 (two) times daily. 3 each 3  . furosemide (LASIX) 80 MG tablet TAKE 1/2 TABLET EVERY OTHER DAILY ALTERNATING WITH 1 TABLET EVERY OTHER DAY 68 tablet 3  . gabapentin (NEURONTIN) 300 MG capsule TAKE 1 CAPSULE THREE TIMES DAILY 270 capsule 3  . insulin aspart (NOVOLOG) 100 UNIT/ML FlexPen Inject 20 Units into the skin 4 (four) times daily - after meals and at bedtime. Follow SS 15 pen 3  . isosorbide mononitrate (IMDUR) 60 MG 24 hr tablet TAKE 1 TABLET EVERY DAY 90 tablet 3  . metFORMIN (GLUCOPHAGE) 500 MG tablet TAKE 1 TABLET TWICE DAILY WITH A MEAL 180 tablet 3  . metoprolol tartrate (LOPRESSOR) 25 MG tablet Take 1 tablet (25 mg total) by mouth 2 (two) times daily. 180 tablet 3  . nitroGLYCERIN (NITROSTAT) 0.4 MG SL tablet Place 1 tablet (0.4 mg total) under the tongue every 5 (five) minutes as needed (up to 3 doses). For chest pain 60 tablet 3  . pioglitazone (ACTOS) 45 MG tablet TAKE 1 TABLET EVERY DAY 90 tablet 3  . potassium chloride SA (K-DUR,KLOR-CON) 20 MEQ tablet Take 1 tablet (20 mEq total) by mouth daily. 90 tablet 3  . pravastatin (PRAVACHOL) 40 MG tablet TAKE 1 TABLET EVERY DAY 90 tablet  3  . ranolazine (RANEXA) 500 MG 12 hr tablet TAKE 1 TABLET TWICE DAILY 180 tablet 3  . triamcinolone cream (KENALOG) 0.5 % Apply 1 application topically 2 (two) times daily as needed (rash). 30 g 3  . vitamin B-12 (CYANOCOBALAMIN) 1000 MCG tablet Take 1,000 mcg by mouth daily.     Marland Kitchen HYDROcodone-acetaminophen (NORCO/VICODIN) 5-325 MG tablet Take 1-2 tablets by mouth every 6 (six) hours as needed. (Patient not taking: Reported on 02/21/2019) 10 tablet 0   No facility-administered medications prior to visit.     ROS: Review of Systems  Constitutional: Positive for fatigue. Negative for appetite change and unexpected weight change.  HENT: Negative for congestion, nosebleeds, sneezing, sore throat and trouble swallowing.   Eyes: Negative for itching and visual disturbance.  Respiratory: Negative for cough.   Cardiovascular: Positive for leg swelling. Negative for chest pain and palpitations.  Gastrointestinal: Negative for abdominal distention, blood in stool, diarrhea and nausea.  Genitourinary: Negative for frequency and hematuria.  Musculoskeletal: Positive for arthralgias and back pain. Negative for gait problem, joint swelling and neck pain.  Skin: Positive for rash.  Neurological: Positive for weakness. Negative for dizziness, tremors and speech difficulty.  Hematological: Bruises/bleeds easily.  Psychiatric/Behavioral: Negative for agitation, dysphoric mood, sleep disturbance and suicidal ideas. The patient is not nervous/anxious.  Objective:  BP 124/66 (BP Location: Left Arm, Patient Position: Sitting, Cuff Size: Large)   Pulse 72   Temp (!) 97.2 F (36.2 C) (Oral)   Ht 6\' 4"  (1.93 m)   Wt (!) 319 lb (144.7 kg)   SpO2 94%   BMI 38.83 kg/m   BP Readings from Last 3 Encounters:  02/21/19 124/66  11/22/18 (!) 126/58  08/23/18 126/72    Wt Readings from Last 3 Encounters:  02/21/19 (!) 319 lb (144.7 kg)  11/22/18 (!) 334 lb (151.5 kg)  08/23/18 (!) 325 lb (147.4 kg)     Physical Exam Constitutional:      General: He is not in acute distress.    Appearance: He is well-developed.     Comments: NAD  Eyes:     Conjunctiva/sclera: Conjunctivae normal.     Pupils: Pupils are equal, round, and reactive to light.  Neck:     Musculoskeletal: Normal range of motion.     Thyroid: No thyromegaly.     Vascular: No JVD.  Cardiovascular:     Rate and Rhythm: Normal rate and regular rhythm.     Heart sounds: Normal heart sounds. No murmur. No friction rub. No gallop.   Pulmonary:     Effort: Pulmonary effort is normal. No respiratory distress.     Breath sounds: Normal breath sounds. No wheezing or rales.  Chest:     Chest wall: No tenderness.  Abdominal:     General: Bowel sounds are normal. There is no distension.     Palpations: Abdomen is soft. There is no mass.     Tenderness: There is no abdominal tenderness. There is no guarding or rebound.  Musculoskeletal: Normal range of motion.        General: Tenderness present.     Right lower leg: Edema present.     Left lower leg: Edema present.  Lymphadenopathy:     Cervical: No cervical adenopathy.  Skin:    General: Skin is warm and dry.     Findings: No rash.  Neurological:     Mental Status: He is alert and oriented to person, place, and time.     Cranial Nerves: No cranial nerve deficit.     Motor: No abnormal muscle tone.     Coordination: Coordination abnormal.     Gait: Gait abnormal.     Deep Tendon Reflexes: Reflexes are normal and symmetric.  Psychiatric:        Behavior: Behavior normal.        Thought Content: Thought content normal.        Judgment: Judgment normal.   aphasic/dysarthric  In a w/c Scaly skin L elbow L elbow/shoulder w/pain LEs w/1+ edema, erythema  Lab Results  Component Value Date   WBC 6.6 08/12/2018   HGB 11.8 (L) 08/12/2018   HCT 37.4 (L) 08/12/2018   PLT 228 08/12/2018   GLUCOSE 212 (H) 11/22/2018   CHOL 131 06/01/2017   TRIG 31 06/01/2017   HDL 45  06/01/2017   LDLCALC 80 06/01/2017   ALT 7 03/10/2017   AST 15 03/10/2017   NA 138 11/22/2018   K 4.3 11/22/2018   CL 101 11/22/2018   CREATININE 0.82 11/22/2018   BUN 18 11/22/2018   CO2 31 11/22/2018   TSH 2.584 06/01/2017   PSA 0.96 08/28/2010   INR 1.06 06/01/2017   HGBA1C 6.8 (H) 11/22/2018   MICROALBUR 1.4 08/28/2010    Dg Elbow Complete Left  Result Date: 08/12/2018 CLINICAL  DATA:  78 year old male with history of trauma from a fall complaining of left elbow pain. EXAM: LEFT ELBOW - COMPLETE 3+ VIEW COMPARISON:  None. FINDINGS: There is no evidence of fracture, dislocation, or joint effusion. There is no evidence of arthropathy or other focal bone abnormality. Soft tissues are unremarkable. IMPRESSION: Negative. Electronically Signed   By: Vinnie Langton M.D.   On: 08/12/2018 22:15   Dg Shoulder Left  Result Date: 08/12/2018 CLINICAL DATA:  Patient status post fall.  Initial encounter. EXAM: LEFT SHOULDER - 2+ VIEW COMPARISON:  None. FINDINGS: Mildly angulated transverse fracture through the proximal left humerus. Humeral head appears located. Visualized left hemithorax is unremarkable. IMPRESSION: Mildly displaced transverse fracture through the proximal left humerus. Electronically Signed   By: Lovey Newcomer M.D.   On: 08/12/2018 22:15   Dg Humerus Left  Result Date: 08/12/2018 CLINICAL DATA:  78 year old male with history of trauma from a fall complaining of pain in the left shoulder. EXAM: LEFT HUMERUS - 2+ VIEW COMPARISON:  No priors. FINDINGS: Horizontal fracture through the humeral neck which appears mildly impacted. Mild varus rotation. Distal aspect of the humerus is otherwise intact. IMPRESSION: 1. Horizontal fracture through the humeral neck with mild varus rotation and mild impaction. Electronically Signed   By: Vinnie Langton M.D.   On: 08/12/2018 22:18    Assessment & Plan:   There are no diagnoses linked to this encounter.   No orders of the defined types  were placed in this encounter.    Follow-up: No follow-ups on file.  [name], MD

## 2019-02-21 NOTE — Assessment & Plan Note (Signed)
ASA Plavix aphasic/dysarthric  -- OT/ST

## 2019-02-21 NOTE — Assessment & Plan Note (Addendum)
Ortho - Dr Percell Miller. Just finished PT/OT Pt would like to have a 2nd opinion Will ref to Dr Amedeo Plenty

## 2019-02-21 NOTE — Assessment & Plan Note (Signed)
On B12 

## 2019-02-21 NOTE — Assessment & Plan Note (Signed)
Triamc oint 

## 2019-02-21 NOTE — Patient Instructions (Signed)
Jason Fung "The Obesity Code" 

## 2019-02-21 NOTE — Assessment & Plan Note (Signed)
-   a new CVA -- aphasic/dysarthric  OT/ST at home

## 2019-02-26 DIAGNOSIS — E11319 Type 2 diabetes mellitus with unspecified diabetic retinopathy without macular edema: Secondary | ICD-10-CM | POA: Diagnosis not present

## 2019-02-26 DIAGNOSIS — Z89431 Acquired absence of right foot: Secondary | ICD-10-CM | POA: Diagnosis not present

## 2019-02-26 DIAGNOSIS — Z7984 Long term (current) use of oral hypoglycemic drugs: Secondary | ICD-10-CM | POA: Diagnosis not present

## 2019-02-26 DIAGNOSIS — E538 Deficiency of other specified B group vitamins: Secondary | ICD-10-CM | POA: Diagnosis not present

## 2019-02-26 DIAGNOSIS — Z7982 Long term (current) use of aspirin: Secondary | ICD-10-CM | POA: Diagnosis not present

## 2019-02-26 DIAGNOSIS — Z9181 History of falling: Secondary | ICD-10-CM | POA: Diagnosis not present

## 2019-02-26 DIAGNOSIS — I10 Essential (primary) hypertension: Secondary | ICD-10-CM | POA: Diagnosis not present

## 2019-02-26 DIAGNOSIS — Z89412 Acquired absence of left great toe: Secondary | ICD-10-CM | POA: Diagnosis not present

## 2019-02-26 DIAGNOSIS — I69320 Aphasia following cerebral infarction: Secondary | ICD-10-CM | POA: Diagnosis not present

## 2019-02-26 DIAGNOSIS — I69322 Dysarthria following cerebral infarction: Secondary | ICD-10-CM | POA: Diagnosis not present

## 2019-02-26 DIAGNOSIS — S42202D Unspecified fracture of upper end of left humerus, subsequent encounter for fracture with routine healing: Secondary | ICD-10-CM | POA: Diagnosis not present

## 2019-02-26 DIAGNOSIS — I2510 Atherosclerotic heart disease of native coronary artery without angina pectoris: Secondary | ICD-10-CM | POA: Diagnosis not present

## 2019-02-26 DIAGNOSIS — I480 Paroxysmal atrial fibrillation: Secondary | ICD-10-CM | POA: Diagnosis not present

## 2019-02-26 DIAGNOSIS — Z7902 Long term (current) use of antithrombotics/antiplatelets: Secondary | ICD-10-CM | POA: Diagnosis not present

## 2019-02-27 ENCOUNTER — Telehealth: Payer: Self-pay | Admitting: Internal Medicine

## 2019-02-27 DIAGNOSIS — S42202D Unspecified fracture of upper end of left humerus, subsequent encounter for fracture with routine healing: Secondary | ICD-10-CM | POA: Diagnosis not present

## 2019-02-27 DIAGNOSIS — I69322 Dysarthria following cerebral infarction: Secondary | ICD-10-CM | POA: Diagnosis not present

## 2019-02-27 DIAGNOSIS — I69320 Aphasia following cerebral infarction: Secondary | ICD-10-CM | POA: Diagnosis not present

## 2019-02-27 DIAGNOSIS — I10 Essential (primary) hypertension: Secondary | ICD-10-CM | POA: Diagnosis not present

## 2019-02-27 DIAGNOSIS — I2510 Atherosclerotic heart disease of native coronary artery without angina pectoris: Secondary | ICD-10-CM | POA: Diagnosis not present

## 2019-02-27 DIAGNOSIS — E11319 Type 2 diabetes mellitus with unspecified diabetic retinopathy without macular edema: Secondary | ICD-10-CM | POA: Diagnosis not present

## 2019-02-27 NOTE — Telephone Encounter (Signed)
Copied from [place] [phone]. Topic: Quick Communication - Home Health Verbal Orders >> Feb 27, 2019  1:02 PM Sheran Luz wrote: Caller/Agency: [facility] Number: [phone] Requesting Speech Therapy Frequency: 1w1 2w2 for cognition   Also requesting PT eval

## 2019-02-28 NOTE — Telephone Encounter (Signed)
LM giving verbals, FYI 

## 2019-03-04 DIAGNOSIS — I10 Essential (primary) hypertension: Secondary | ICD-10-CM | POA: Diagnosis not present

## 2019-03-04 DIAGNOSIS — E11319 Type 2 diabetes mellitus with unspecified diabetic retinopathy without macular edema: Secondary | ICD-10-CM | POA: Diagnosis not present

## 2019-03-04 DIAGNOSIS — I69320 Aphasia following cerebral infarction: Secondary | ICD-10-CM | POA: Diagnosis not present

## 2019-03-04 DIAGNOSIS — S42202D Unspecified fracture of upper end of left humerus, subsequent encounter for fracture with routine healing: Secondary | ICD-10-CM | POA: Diagnosis not present

## 2019-03-04 DIAGNOSIS — I2510 Atherosclerotic heart disease of native coronary artery without angina pectoris: Secondary | ICD-10-CM | POA: Diagnosis not present

## 2019-03-04 DIAGNOSIS — I69322 Dysarthria following cerebral infarction: Secondary | ICD-10-CM | POA: Diagnosis not present

## 2019-03-07 ENCOUNTER — Telehealth: Payer: Self-pay

## 2019-03-07 NOTE — Telephone Encounter (Signed)
Copied from [place] [id number]. Topic: General - Other >> Mar 07, 2019 11:51 AM Yvette Rack wrote: Reason for CRM: Verdis Frederickson with [facility] returned call to office. Verdis Frederickson stated the patient wants to put therapy on hold as he is afraid to have people in and out of the home as his wife has respiratory problems and does not want to be exposed to others during this time.

## 2019-03-08 DIAGNOSIS — E11319 Type 2 diabetes mellitus with unspecified diabetic retinopathy without macular edema: Secondary | ICD-10-CM | POA: Diagnosis not present

## 2019-03-08 DIAGNOSIS — Z9181 History of falling: Secondary | ICD-10-CM

## 2019-03-08 DIAGNOSIS — I480 Paroxysmal atrial fibrillation: Secondary | ICD-10-CM | POA: Diagnosis not present

## 2019-03-08 DIAGNOSIS — Z7902 Long term (current) use of antithrombotics/antiplatelets: Secondary | ICD-10-CM | POA: Diagnosis not present

## 2019-03-08 DIAGNOSIS — Z7982 Long term (current) use of aspirin: Secondary | ICD-10-CM

## 2019-03-08 DIAGNOSIS — I2510 Atherosclerotic heart disease of native coronary artery without angina pectoris: Secondary | ICD-10-CM

## 2019-03-08 DIAGNOSIS — E538 Deficiency of other specified B group vitamins: Secondary | ICD-10-CM | POA: Diagnosis not present

## 2019-03-08 DIAGNOSIS — I69322 Dysarthria following cerebral infarction: Secondary | ICD-10-CM | POA: Diagnosis not present

## 2019-03-08 DIAGNOSIS — Z7984 Long term (current) use of oral hypoglycemic drugs: Secondary | ICD-10-CM

## 2019-03-08 DIAGNOSIS — Z89431 Acquired absence of right foot: Secondary | ICD-10-CM

## 2019-03-08 DIAGNOSIS — S42202D Unspecified fracture of upper end of left humerus, subsequent encounter for fracture with routine healing: Secondary | ICD-10-CM | POA: Diagnosis not present

## 2019-03-08 DIAGNOSIS — I10 Essential (primary) hypertension: Secondary | ICD-10-CM | POA: Diagnosis not present

## 2019-03-08 DIAGNOSIS — I69320 Aphasia following cerebral infarction: Secondary | ICD-10-CM | POA: Diagnosis not present

## 2019-03-08 DIAGNOSIS — Z89412 Acquired absence of left great toe: Secondary | ICD-10-CM

## 2019-03-11 NOTE — Telephone Encounter (Signed)
Noted. Thx.

## 2019-03-28 DIAGNOSIS — I480 Paroxysmal atrial fibrillation: Secondary | ICD-10-CM | POA: Diagnosis not present

## 2019-03-28 DIAGNOSIS — I69322 Dysarthria following cerebral infarction: Secondary | ICD-10-CM | POA: Diagnosis not present

## 2019-03-28 DIAGNOSIS — Z89431 Acquired absence of right foot: Secondary | ICD-10-CM | POA: Diagnosis not present

## 2019-03-28 DIAGNOSIS — Z9181 History of falling: Secondary | ICD-10-CM | POA: Diagnosis not present

## 2019-03-28 DIAGNOSIS — Z89412 Acquired absence of left great toe: Secondary | ICD-10-CM | POA: Diagnosis not present

## 2019-03-28 DIAGNOSIS — Z7984 Long term (current) use of oral hypoglycemic drugs: Secondary | ICD-10-CM | POA: Diagnosis not present

## 2019-03-28 DIAGNOSIS — I2510 Atherosclerotic heart disease of native coronary artery without angina pectoris: Secondary | ICD-10-CM | POA: Diagnosis not present

## 2019-03-28 DIAGNOSIS — E538 Deficiency of other specified B group vitamins: Secondary | ICD-10-CM | POA: Diagnosis not present

## 2019-03-28 DIAGNOSIS — S42202D Unspecified fracture of upper end of left humerus, subsequent encounter for fracture with routine healing: Secondary | ICD-10-CM | POA: Diagnosis not present

## 2019-03-28 DIAGNOSIS — I10 Essential (primary) hypertension: Secondary | ICD-10-CM | POA: Diagnosis not present

## 2019-03-28 DIAGNOSIS — Z7982 Long term (current) use of aspirin: Secondary | ICD-10-CM | POA: Diagnosis not present

## 2019-03-28 DIAGNOSIS — E11319 Type 2 diabetes mellitus with unspecified diabetic retinopathy without macular edema: Secondary | ICD-10-CM | POA: Diagnosis not present

## 2019-03-28 DIAGNOSIS — I69320 Aphasia following cerebral infarction: Secondary | ICD-10-CM | POA: Diagnosis not present

## 2019-03-28 DIAGNOSIS — Z7902 Long term (current) use of antithrombotics/antiplatelets: Secondary | ICD-10-CM | POA: Diagnosis not present

## 2019-04-23 ENCOUNTER — Telehealth: Payer: Self-pay | Admitting: Internal Medicine

## 2019-04-23 NOTE — Telephone Encounter (Unsigned)
Copied from [place] [phone]. Topic: Quick Communication - See Telephone Encounter >> Apr 23, 2019 10:24 AM Valla Leaver wrote: CRM for notification. See Telephone encounter for: 04/23/19. Darlene, [facility], will be doing re-certification to come back into the patients home to do an evaluation and pick up where they left off.

## 2019-04-23 NOTE — Telephone Encounter (Signed)
FYI

## 2019-04-24 DIAGNOSIS — S42202D Unspecified fracture of upper end of left humerus, subsequent encounter for fracture with routine healing: Secondary | ICD-10-CM | POA: Diagnosis not present

## 2019-04-24 DIAGNOSIS — I10 Essential (primary) hypertension: Secondary | ICD-10-CM | POA: Diagnosis not present

## 2019-04-24 DIAGNOSIS — I2510 Atherosclerotic heart disease of native coronary artery without angina pectoris: Secondary | ICD-10-CM | POA: Diagnosis not present

## 2019-04-24 DIAGNOSIS — E11319 Type 2 diabetes mellitus with unspecified diabetic retinopathy without macular edema: Secondary | ICD-10-CM | POA: Diagnosis not present

## 2019-04-24 DIAGNOSIS — I69320 Aphasia following cerebral infarction: Secondary | ICD-10-CM | POA: Diagnosis not present

## 2019-04-24 DIAGNOSIS — I69322 Dysarthria following cerebral infarction: Secondary | ICD-10-CM | POA: Diagnosis not present

## 2019-04-25 ENCOUNTER — Telehealth: Payer: Self-pay | Admitting: Internal Medicine

## 2019-04-25 NOTE — Telephone Encounter (Signed)
Copied from [place] [phone]. Topic: Quick Communication - Home Health Verbal Orders >> Apr 25, 2019 12:33 PM Leward Quan A wrote: Caller/Agency: Junie Panning / [facility] Number: [phone] ok to LM Requesting OT/PT/Skilled Nursing/Social Work/Speech Therapy: PT / OT evaluation Frequency:  Patient does not need any speech therapy

## 2019-04-25 NOTE — Telephone Encounter (Signed)
Verbals given, FYI 

## 2019-04-27 ENCOUNTER — Telehealth: Payer: Self-pay | Admitting: Internal Medicine

## 2019-04-27 DIAGNOSIS — E11319 Type 2 diabetes mellitus with unspecified diabetic retinopathy without macular edema: Secondary | ICD-10-CM | POA: Diagnosis not present

## 2019-04-27 DIAGNOSIS — Z89412 Acquired absence of left great toe: Secondary | ICD-10-CM | POA: Diagnosis not present

## 2019-04-27 DIAGNOSIS — Z89431 Acquired absence of right foot: Secondary | ICD-10-CM | POA: Diagnosis not present

## 2019-04-27 DIAGNOSIS — E538 Deficiency of other specified B group vitamins: Secondary | ICD-10-CM | POA: Diagnosis not present

## 2019-04-27 DIAGNOSIS — I480 Paroxysmal atrial fibrillation: Secondary | ICD-10-CM | POA: Diagnosis not present

## 2019-04-27 DIAGNOSIS — I2510 Atherosclerotic heart disease of native coronary artery without angina pectoris: Secondary | ICD-10-CM | POA: Diagnosis not present

## 2019-04-27 DIAGNOSIS — Z9181 History of falling: Secondary | ICD-10-CM | POA: Diagnosis not present

## 2019-04-27 DIAGNOSIS — Z7984 Long term (current) use of oral hypoglycemic drugs: Secondary | ICD-10-CM | POA: Diagnosis not present

## 2019-04-27 DIAGNOSIS — I10 Essential (primary) hypertension: Secondary | ICD-10-CM | POA: Diagnosis not present

## 2019-04-27 DIAGNOSIS — I69322 Dysarthria following cerebral infarction: Secondary | ICD-10-CM | POA: Diagnosis not present

## 2019-04-27 DIAGNOSIS — I69320 Aphasia following cerebral infarction: Secondary | ICD-10-CM | POA: Diagnosis not present

## 2019-04-27 DIAGNOSIS — I639 Cerebral infarction, unspecified: Secondary | ICD-10-CM | POA: Diagnosis not present

## 2019-04-27 NOTE — Telephone Encounter (Signed)
Copied from [place] [phone]. Topic: Quick Communication - Home Health Verbal Orders >> Apr 27, 2019  4:09 PM Luciana Axe wrote: Caller/Agency-Kate White/[facility]   Callback Number: [phone] ok to leave verbal on voice mail Requesting OT/PT/Skilled Nursing/Social Work/Speech Therapy: PT Frequency: 1x a 1 week, 2x a week for 4 weeks.

## 2019-05-01 NOTE — Telephone Encounter (Signed)
LM giving verbals, FYI 

## 2019-05-02 DIAGNOSIS — I10 Essential (primary) hypertension: Secondary | ICD-10-CM | POA: Diagnosis not present

## 2019-05-02 DIAGNOSIS — I69322 Dysarthria following cerebral infarction: Secondary | ICD-10-CM | POA: Diagnosis not present

## 2019-05-02 DIAGNOSIS — I480 Paroxysmal atrial fibrillation: Secondary | ICD-10-CM | POA: Diagnosis not present

## 2019-05-02 DIAGNOSIS — I2510 Atherosclerotic heart disease of native coronary artery without angina pectoris: Secondary | ICD-10-CM | POA: Diagnosis not present

## 2019-05-02 DIAGNOSIS — I69320 Aphasia following cerebral infarction: Secondary | ICD-10-CM | POA: Diagnosis not present

## 2019-05-02 DIAGNOSIS — E11319 Type 2 diabetes mellitus with unspecified diabetic retinopathy without macular edema: Secondary | ICD-10-CM | POA: Diagnosis not present

## 2019-05-03 DIAGNOSIS — E11319 Type 2 diabetes mellitus with unspecified diabetic retinopathy without macular edema: Secondary | ICD-10-CM | POA: Diagnosis not present

## 2019-05-03 DIAGNOSIS — I2510 Atherosclerotic heart disease of native coronary artery without angina pectoris: Secondary | ICD-10-CM | POA: Diagnosis not present

## 2019-05-03 DIAGNOSIS — I69320 Aphasia following cerebral infarction: Secondary | ICD-10-CM | POA: Diagnosis not present

## 2019-05-03 DIAGNOSIS — I10 Essential (primary) hypertension: Secondary | ICD-10-CM | POA: Diagnosis not present

## 2019-05-03 DIAGNOSIS — I480 Paroxysmal atrial fibrillation: Secondary | ICD-10-CM | POA: Diagnosis not present

## 2019-05-03 DIAGNOSIS — I69322 Dysarthria following cerebral infarction: Secondary | ICD-10-CM | POA: Diagnosis not present

## 2019-05-04 DIAGNOSIS — M25512 Pain in left shoulder: Secondary | ICD-10-CM | POA: Diagnosis not present

## 2019-05-04 DIAGNOSIS — S42212K Unspecified displaced fracture of surgical neck of left humerus, subsequent encounter for fracture with nonunion: Secondary | ICD-10-CM | POA: Diagnosis not present

## 2019-05-05 ENCOUNTER — Other Ambulatory Visit: Payer: Self-pay | Admitting: Internal Medicine

## 2019-05-08 DIAGNOSIS — I2510 Atherosclerotic heart disease of native coronary artery without angina pectoris: Secondary | ICD-10-CM | POA: Diagnosis not present

## 2019-05-08 DIAGNOSIS — I69320 Aphasia following cerebral infarction: Secondary | ICD-10-CM | POA: Diagnosis not present

## 2019-05-08 DIAGNOSIS — E11319 Type 2 diabetes mellitus with unspecified diabetic retinopathy without macular edema: Secondary | ICD-10-CM | POA: Diagnosis not present

## 2019-05-08 DIAGNOSIS — I480 Paroxysmal atrial fibrillation: Secondary | ICD-10-CM | POA: Diagnosis not present

## 2019-05-08 DIAGNOSIS — I10 Essential (primary) hypertension: Secondary | ICD-10-CM | POA: Diagnosis not present

## 2019-05-08 DIAGNOSIS — I69322 Dysarthria following cerebral infarction: Secondary | ICD-10-CM | POA: Diagnosis not present

## 2019-05-10 DIAGNOSIS — I10 Essential (primary) hypertension: Secondary | ICD-10-CM | POA: Diagnosis not present

## 2019-05-10 DIAGNOSIS — I2510 Atherosclerotic heart disease of native coronary artery without angina pectoris: Secondary | ICD-10-CM | POA: Diagnosis not present

## 2019-05-10 DIAGNOSIS — I69322 Dysarthria following cerebral infarction: Secondary | ICD-10-CM | POA: Diagnosis not present

## 2019-05-10 DIAGNOSIS — I69320 Aphasia following cerebral infarction: Secondary | ICD-10-CM | POA: Diagnosis not present

## 2019-05-10 DIAGNOSIS — I480 Paroxysmal atrial fibrillation: Secondary | ICD-10-CM | POA: Diagnosis not present

## 2019-05-10 DIAGNOSIS — E11319 Type 2 diabetes mellitus with unspecified diabetic retinopathy without macular edema: Secondary | ICD-10-CM | POA: Diagnosis not present

## 2019-05-11 DIAGNOSIS — I69322 Dysarthria following cerebral infarction: Secondary | ICD-10-CM | POA: Diagnosis not present

## 2019-05-11 DIAGNOSIS — I10 Essential (primary) hypertension: Secondary | ICD-10-CM | POA: Diagnosis not present

## 2019-05-11 DIAGNOSIS — I2510 Atherosclerotic heart disease of native coronary artery without angina pectoris: Secondary | ICD-10-CM | POA: Diagnosis not present

## 2019-05-11 DIAGNOSIS — E11319 Type 2 diabetes mellitus with unspecified diabetic retinopathy without macular edema: Secondary | ICD-10-CM | POA: Diagnosis not present

## 2019-05-11 DIAGNOSIS — I69320 Aphasia following cerebral infarction: Secondary | ICD-10-CM | POA: Diagnosis not present

## 2019-05-11 DIAGNOSIS — I480 Paroxysmal atrial fibrillation: Secondary | ICD-10-CM | POA: Diagnosis not present

## 2019-05-15 DIAGNOSIS — I480 Paroxysmal atrial fibrillation: Secondary | ICD-10-CM | POA: Diagnosis not present

## 2019-05-15 DIAGNOSIS — I10 Essential (primary) hypertension: Secondary | ICD-10-CM | POA: Diagnosis not present

## 2019-05-15 DIAGNOSIS — I2510 Atherosclerotic heart disease of native coronary artery without angina pectoris: Secondary | ICD-10-CM | POA: Diagnosis not present

## 2019-05-15 DIAGNOSIS — I69320 Aphasia following cerebral infarction: Secondary | ICD-10-CM | POA: Diagnosis not present

## 2019-05-15 DIAGNOSIS — I69322 Dysarthria following cerebral infarction: Secondary | ICD-10-CM | POA: Diagnosis not present

## 2019-05-15 DIAGNOSIS — E11319 Type 2 diabetes mellitus with unspecified diabetic retinopathy without macular edema: Secondary | ICD-10-CM | POA: Diagnosis not present

## 2019-05-17 DIAGNOSIS — I69322 Dysarthria following cerebral infarction: Secondary | ICD-10-CM | POA: Diagnosis not present

## 2019-05-17 DIAGNOSIS — I69320 Aphasia following cerebral infarction: Secondary | ICD-10-CM | POA: Diagnosis not present

## 2019-05-17 DIAGNOSIS — E11319 Type 2 diabetes mellitus with unspecified diabetic retinopathy without macular edema: Secondary | ICD-10-CM | POA: Diagnosis not present

## 2019-05-17 DIAGNOSIS — I10 Essential (primary) hypertension: Secondary | ICD-10-CM | POA: Diagnosis not present

## 2019-05-17 DIAGNOSIS — I2510 Atherosclerotic heart disease of native coronary artery without angina pectoris: Secondary | ICD-10-CM | POA: Diagnosis not present

## 2019-05-17 DIAGNOSIS — I480 Paroxysmal atrial fibrillation: Secondary | ICD-10-CM | POA: Diagnosis not present

## 2019-05-18 DIAGNOSIS — E11319 Type 2 diabetes mellitus with unspecified diabetic retinopathy without macular edema: Secondary | ICD-10-CM | POA: Diagnosis not present

## 2019-05-18 DIAGNOSIS — I480 Paroxysmal atrial fibrillation: Secondary | ICD-10-CM | POA: Diagnosis not present

## 2019-05-18 DIAGNOSIS — I2510 Atherosclerotic heart disease of native coronary artery without angina pectoris: Secondary | ICD-10-CM | POA: Diagnosis not present

## 2019-05-18 DIAGNOSIS — I10 Essential (primary) hypertension: Secondary | ICD-10-CM | POA: Diagnosis not present

## 2019-05-18 DIAGNOSIS — I69320 Aphasia following cerebral infarction: Secondary | ICD-10-CM | POA: Diagnosis not present

## 2019-05-18 DIAGNOSIS — I69322 Dysarthria following cerebral infarction: Secondary | ICD-10-CM | POA: Diagnosis not present

## 2019-05-22 DIAGNOSIS — I69320 Aphasia following cerebral infarction: Secondary | ICD-10-CM | POA: Diagnosis not present

## 2019-05-22 DIAGNOSIS — I2510 Atherosclerotic heart disease of native coronary artery without angina pectoris: Secondary | ICD-10-CM | POA: Diagnosis not present

## 2019-05-22 DIAGNOSIS — I480 Paroxysmal atrial fibrillation: Secondary | ICD-10-CM | POA: Diagnosis not present

## 2019-05-22 DIAGNOSIS — I69322 Dysarthria following cerebral infarction: Secondary | ICD-10-CM | POA: Diagnosis not present

## 2019-05-22 DIAGNOSIS — I10 Essential (primary) hypertension: Secondary | ICD-10-CM | POA: Diagnosis not present

## 2019-05-22 DIAGNOSIS — E11319 Type 2 diabetes mellitus with unspecified diabetic retinopathy without macular edema: Secondary | ICD-10-CM | POA: Diagnosis not present

## 2019-05-24 ENCOUNTER — Telehealth: Payer: Self-pay | Admitting: Internal Medicine

## 2019-05-24 DIAGNOSIS — I69322 Dysarthria following cerebral infarction: Secondary | ICD-10-CM | POA: Diagnosis not present

## 2019-05-24 DIAGNOSIS — I10 Essential (primary) hypertension: Secondary | ICD-10-CM | POA: Diagnosis not present

## 2019-05-24 DIAGNOSIS — I69320 Aphasia following cerebral infarction: Secondary | ICD-10-CM | POA: Diagnosis not present

## 2019-05-24 DIAGNOSIS — I480 Paroxysmal atrial fibrillation: Secondary | ICD-10-CM | POA: Diagnosis not present

## 2019-05-24 DIAGNOSIS — E11319 Type 2 diabetes mellitus with unspecified diabetic retinopathy without macular edema: Secondary | ICD-10-CM | POA: Diagnosis not present

## 2019-05-24 DIAGNOSIS — I2510 Atherosclerotic heart disease of native coronary artery without angina pectoris: Secondary | ICD-10-CM | POA: Diagnosis not present

## 2019-05-24 NOTE — Telephone Encounter (Unsigned)
Copied from [place] [phone]. Topic: Quick Communication - Home Health Verbal Orders >> May 24, 2019 10:43 AM Alanda Slim E wrote: Caller/Agency: Kate/[facility]  Callback Number: [phone] vm can be left  Requesting Pt- extended PT Frequency: 1x a week for 1 week 2x's a week for 3 weeks

## 2019-05-24 NOTE — Telephone Encounter (Signed)
LM giving verbals, FYI 

## 2019-05-25 ENCOUNTER — Other Ambulatory Visit: Payer: Self-pay

## 2019-05-25 ENCOUNTER — Ambulatory Visit (INDEPENDENT_AMBULATORY_CARE_PROVIDER_SITE_OTHER): Payer: Medicare Other | Admitting: Internal Medicine

## 2019-05-25 ENCOUNTER — Encounter: Payer: Self-pay | Admitting: Internal Medicine

## 2019-05-25 ENCOUNTER — Other Ambulatory Visit: Payer: Self-pay | Admitting: Internal Medicine

## 2019-05-25 ENCOUNTER — Other Ambulatory Visit (INDEPENDENT_AMBULATORY_CARE_PROVIDER_SITE_OTHER): Payer: Medicare Other

## 2019-05-25 DIAGNOSIS — I480 Paroxysmal atrial fibrillation: Secondary | ICD-10-CM | POA: Diagnosis not present

## 2019-05-25 DIAGNOSIS — E538 Deficiency of other specified B group vitamins: Secondary | ICD-10-CM | POA: Diagnosis not present

## 2019-05-25 DIAGNOSIS — I2510 Atherosclerotic heart disease of native coronary artery without angina pectoris: Secondary | ICD-10-CM | POA: Diagnosis not present

## 2019-05-25 DIAGNOSIS — R600 Localized edema: Secondary | ICD-10-CM | POA: Diagnosis not present

## 2019-05-25 DIAGNOSIS — E1149 Type 2 diabetes mellitus with other diabetic neurological complication: Secondary | ICD-10-CM | POA: Diagnosis not present

## 2019-05-25 DIAGNOSIS — R32 Unspecified urinary incontinence: Secondary | ICD-10-CM

## 2019-05-25 DIAGNOSIS — I10 Essential (primary) hypertension: Secondary | ICD-10-CM

## 2019-05-25 LAB — BASIC METABOLIC PANEL
BUN: 31 mg/dL — ABNORMAL HIGH (ref 6–23)
CO2: 30 mEq/L (ref 19–32)
Calcium: 9.4 mg/dL (ref 8.4–10.5)
Chloride: 101 mEq/L (ref 96–112)
Creatinine, Ser: 1.27 mg/dL (ref 0.40–1.50)
GFR: 54.88 mL/min — ABNORMAL LOW (ref >60.00)
Glucose, Bld: 156 mg/dL — ABNORMAL HIGH (ref 70–99)
Potassium: 5.5 mEq/L — ABNORMAL HIGH (ref 3.5–5.1)
Sodium: 138 mEq/L (ref 135–145)

## 2019-05-25 LAB — URINALYSIS, ROUTINE W REFLEX MICROSCOPIC
Bilirubin Urine: NEGATIVE
Hgb urine dipstick: NEGATIVE
Ketones, ur: NEGATIVE
Nitrite: NEGATIVE
Specific Gravity, Urine: 1.02 (ref 1.000–1.030)
Total Protein, Urine: NEGATIVE
Urine Glucose: NEGATIVE
Urobilinogen, UA: 1 (ref 0.0–1.0)
pH: 6 (ref 5.0–8.0)

## 2019-05-25 LAB — HEMOGLOBIN A1C: Hgb A1c MFr Bld: 7.2 % — ABNORMAL HIGH (ref 4.6–6.5)

## 2019-05-25 MED ORDER — DOXYCYCLINE HYCLATE 100 MG PO TABS: 100.0000 mg | ORAL_TABLET | Freq: Two times a day (BID) | ORAL | 0 refills | Status: DC

## 2019-05-25 MED ORDER — TOLTERODINE TARTRATE ER 4 MG PO CP24: 4.0000 mg | ORAL_CAPSULE | Freq: Every day | ORAL | 11 refills | Status: DC

## 2019-05-25 NOTE — Assessment & Plan Note (Signed)
Labs

## 2019-05-25 NOTE — Assessment & Plan Note (Addendum)
Multifactorial Labs and UA Pads Try Detrol LA

## 2019-05-25 NOTE — Assessment & Plan Note (Signed)
Chronic Cont Furosemide

## 2019-05-25 NOTE — Assessment & Plan Note (Signed)
No relapse on meds

## 2019-05-25 NOTE — Assessment & Plan Note (Signed)
No angina on meds

## 2019-05-25 NOTE — Assessment & Plan Note (Signed)
On B12 

## 2019-05-25 NOTE — Assessment & Plan Note (Signed)
Nl BP at home 

## 2019-05-25 NOTE — Progress Notes (Signed)
Subjective:  Patient ID: Charles [name], male    DOB: 09-08-1941  Age: 78 y.o. MRN: [id number]  CC: No chief complaint on file.   HPI Charles [name] presents for DM,CAD, HTN.  C/o no bladder control - leaking urine x 3-4 months C/o sore on the led - started Doxy  Outpatient Medications Prior to Visit  Medication Sig Dispense Refill  . acetaminophen (TYLENOL) 500 MG tablet Take 1,000 mg by mouth at bedtime.     Marland Kitchen aspirin EC 81 MG tablet Take 81 mg by mouth daily.     . cholecalciferol (VITAMIN D) 1000 units tablet Take 1,000 Units by mouth daily.     . clopidogrel (PLAVIX) 75 MG tablet TAKE 1 TABLET EVERY DAY 90 tablet 3  . collagenase (SANTYL) ointment Apply 1 application topically daily. 15 g 3  . Fluticasone-Salmeterol (ADVAIR DISKUS) 100-50 MCG/DOSE AEPB Inhale 1 puff into the lungs 2 (two) times daily. 3 each 3  . furosemide (LASIX) 80 MG tablet TAKE 1/2 TABLET EVERY OTHER DAILY ALTERNATING WITH 1 TABLET EVERY OTHER DAY 68 tablet 3  . gabapentin (NEURONTIN) 300 MG capsule TAKE 1 CAPSULE THREE TIMES DAILY 270 capsule 3  . insulin aspart (NOVOLOG) 100 UNIT/ML FlexPen Inject 20 Units into the skin 4 (four) times daily - after meals and at bedtime. Follow SS 15 pen 3  . isosorbide mononitrate (IMDUR) 60 MG 24 hr tablet TAKE 1 TABLET EVERY DAY 90 tablet 3  . metFORMIN (GLUCOPHAGE) 500 MG tablet TAKE 1 TABLET TWICE DAILY WITH A MEAL 180 tablet 3  . metoprolol tartrate (LOPRESSOR) 25 MG tablet Take 1 tablet (25 mg total) by mouth 2 (two) times daily. 180 tablet 3  . nitroGLYCERIN (NITROSTAT) 0.4 MG SL tablet DISSOLVE 1 TABLET (0.4 MG TOTAL) UNDER THE TONGUE EVERY 5 (FIVE) MINUTES AS NEEDED (UP TO 3 DOSES) FOR CHEST PAIN 50 tablet 1  . pioglitazone (ACTOS) 45 MG tablet TAKE 1 TABLET EVERY DAY 90 tablet 3  . potassium chloride SA (K-DUR,KLOR-CON) 20 MEQ tablet Take 1 tablet (20 mEq total) by mouth daily. 90 tablet 3  . pravastatin (PRAVACHOL) 40 MG tablet TAKE 1 TABLET EVERY DAY 90 tablet 3   . ranolazine (RANEXA) 500 MG 12 hr tablet TAKE 1 TABLET TWICE DAILY 180 tablet 3  . triamcinolone ointment (KENALOG) 0.5 % Apply 1 application topically 2 (two) times daily. 60 g 0  . vitamin B-12 (CYANOCOBALAMIN) 1000 MCG tablet Take 1,000 mcg by mouth daily.      No facility-administered medications prior to visit.     ROS: Review of Systems  Constitutional: Positive for fatigue. Negative for appetite change and unexpected weight change.  HENT: Positive for hearing loss. Negative for congestion, nosebleeds, sneezing, sore throat and trouble swallowing.   Eyes: Positive for visual disturbance. Negative for itching.  Respiratory: Negative for cough.   Cardiovascular: Negative for chest pain, palpitations and leg swelling.  Gastrointestinal: Negative for abdominal distention, blood in stool, diarrhea and nausea.  Genitourinary: Positive for frequency and urgency. Negative for hematuria.  Musculoskeletal: Positive for arthralgias, back pain, gait problem, myalgias and neck stiffness. Negative for joint swelling and neck pain.  Skin: Negative for rash.  Neurological: Positive for weakness. Negative for dizziness, tremors and speech difficulty.  Psychiatric/Behavioral: Positive for decreased concentration. Negative for agitation, dysphoric mood, sleep disturbance and suicidal ideas. The patient is not nervous/anxious.     Objective:  BP 126/72 (BP Location: Right Arm, Patient Position: Sitting, Cuff Size: Large)  Pulse 69   Temp 98.4 F (36.9 C) (Oral)   Ht 6\' 4"  (1.93 m)   Wt (!) 312 lb (141.5 kg)   SpO2 96%   BMI 37.98 kg/m   BP Readings from Last 3 Encounters:  05/25/19 126/72  02/21/19 124/66  11/22/18 (!) 126/58    Wt Readings from Last 3 Encounters:  05/25/19 (!) 312 lb (141.5 kg)  02/21/19 (!) 319 lb (144.7 kg)  11/22/18 (!) 334 lb (151.5 kg)    Physical Exam Constitutional:      General: He is not in acute distress.    Appearance: He is well-developed.      Comments: NAD  Eyes:     Conjunctiva/sclera: Conjunctivae normal.     Pupils: Pupils are equal, round, and reactive to light.  Neck:     Musculoskeletal: Normal range of motion.     Thyroid: No thyromegaly.     Vascular: No JVD.  Cardiovascular:     Rate and Rhythm: Normal rate and regular rhythm.     Heart sounds: Normal heart sounds. No murmur. No friction rub. No gallop.   Pulmonary:     Effort: Pulmonary effort is normal. No respiratory distress.     Breath sounds: Normal breath sounds. No wheezing or rales.  Chest:     Chest wall: No tenderness.  Abdominal:     General: Bowel sounds are normal. There is no distension.     Palpations: Abdomen is soft. There is no mass.     Tenderness: There is no abdominal tenderness. There is no guarding or rebound.  Musculoskeletal: Normal range of motion.        General: Tenderness present. No swelling.     Right lower leg: Edema present.     Left lower leg: Edema present.  Lymphadenopathy:     Cervical: No cervical adenopathy.  Skin:    General: Skin is warm and dry.     Findings: Lesion present. No rash.  Neurological:     Mental Status: He is alert and oriented to person, place, and time.     Cranial Nerves: No cranial nerve deficit.     Motor: Weakness present. No abnormal muscle tone.     Coordination: Coordination abnormal.     Gait: Gait abnormal.     Deep Tendon Reflexes: Reflexes are normal and symmetric.  Psychiatric:        Behavior: Behavior normal.        Thought Content: Thought content normal.        Judgment: Judgment normal.   obese  Atrophic hand muscles  A 3x4 cm sore on L poster prox calf  Edema 1-2+ B  In a w/c   Lab Results  Component Value Date   WBC 6.6 08/12/2018   HGB 11.8 (L) 08/12/2018   HCT 37.4 (L) 08/12/2018   PLT 228 08/12/2018   GLUCOSE 121 (H) 02/21/2019   CHOL 131 06/01/2017   TRIG 31 06/01/2017   HDL 45 06/01/2017   LDLCALC 80 06/01/2017   ALT 7 03/10/2017   AST 15 03/10/2017    NA 137 02/21/2019   K 4.0 02/21/2019   CL 101 02/21/2019   CREATININE 1.01 02/21/2019   BUN 25 (H) 02/21/2019   CO2 29 02/21/2019   TSH 2.584 06/01/2017   PSA 0.96 08/28/2010   INR 1.06 06/01/2017   HGBA1C 7.4 (H) 02/21/2019   MICROALBUR 1.4 08/28/2010    Dg Elbow Complete Left  Result Date: 08/12/2018 CLINICAL DATA:  78 year old male with history of trauma from a fall complaining of left elbow pain. EXAM: LEFT ELBOW - COMPLETE 3+ VIEW COMPARISON:  None. FINDINGS: There is no evidence of fracture, dislocation, or joint effusion. There is no evidence of arthropathy or other focal bone abnormality. Soft tissues are unremarkable. IMPRESSION: Negative. Electronically Signed   By: Vinnie Langton M.D.   On: 08/12/2018 22:15   Dg Shoulder Left  Result Date: 08/12/2018 CLINICAL DATA:  Patient status post fall.  Initial encounter. EXAM: LEFT SHOULDER - 2+ VIEW COMPARISON:  None. FINDINGS: Mildly angulated transverse fracture through the proximal left humerus. Humeral head appears located. Visualized left hemithorax is unremarkable. IMPRESSION: Mildly displaced transverse fracture through the proximal left humerus. Electronically Signed   By: Lovey Newcomer M.D.   On: 08/12/2018 22:15   Dg Humerus Left  Result Date: 08/12/2018 CLINICAL DATA:  78 year old male with history of trauma from a fall complaining of pain in the left shoulder. EXAM: LEFT HUMERUS - 2+ VIEW COMPARISON:  No priors. FINDINGS: Horizontal fracture through the humeral neck which appears mildly impacted. Mild varus rotation. Distal aspect of the humerus is otherwise intact. IMPRESSION: 1. Horizontal fracture through the humeral neck with mild varus rotation and mild impaction. Electronically Signed   By: Vinnie Langton M.D.   On: 08/12/2018 22:18    Assessment & Plan:   There are no diagnoses linked to this encounter.   No orders of the defined types were placed in this encounter.    Follow-up: No follow-ups on file.   [name], MD

## 2019-05-27 DIAGNOSIS — I480 Paroxysmal atrial fibrillation: Secondary | ICD-10-CM | POA: Diagnosis not present

## 2019-05-27 DIAGNOSIS — I69322 Dysarthria following cerebral infarction: Secondary | ICD-10-CM | POA: Diagnosis not present

## 2019-05-27 DIAGNOSIS — I2510 Atherosclerotic heart disease of native coronary artery without angina pectoris: Secondary | ICD-10-CM | POA: Diagnosis not present

## 2019-05-27 DIAGNOSIS — Z9181 History of falling: Secondary | ICD-10-CM | POA: Diagnosis not present

## 2019-05-27 DIAGNOSIS — I69320 Aphasia following cerebral infarction: Secondary | ICD-10-CM | POA: Diagnosis not present

## 2019-05-27 DIAGNOSIS — I639 Cerebral infarction, unspecified: Secondary | ICD-10-CM | POA: Diagnosis not present

## 2019-05-27 DIAGNOSIS — Z89412 Acquired absence of left great toe: Secondary | ICD-10-CM | POA: Diagnosis not present

## 2019-05-27 DIAGNOSIS — Z7984 Long term (current) use of oral hypoglycemic drugs: Secondary | ICD-10-CM | POA: Diagnosis not present

## 2019-05-27 DIAGNOSIS — Z89431 Acquired absence of right foot: Secondary | ICD-10-CM | POA: Diagnosis not present

## 2019-05-27 DIAGNOSIS — I10 Essential (primary) hypertension: Secondary | ICD-10-CM | POA: Diagnosis not present

## 2019-05-27 DIAGNOSIS — E538 Deficiency of other specified B group vitamins: Secondary | ICD-10-CM | POA: Diagnosis not present

## 2019-05-27 DIAGNOSIS — E11319 Type 2 diabetes mellitus with unspecified diabetic retinopathy without macular edema: Secondary | ICD-10-CM | POA: Diagnosis not present

## 2019-05-30 DIAGNOSIS — I10 Essential (primary) hypertension: Secondary | ICD-10-CM | POA: Diagnosis not present

## 2019-05-30 DIAGNOSIS — I69320 Aphasia following cerebral infarction: Secondary | ICD-10-CM | POA: Diagnosis not present

## 2019-05-30 DIAGNOSIS — E11319 Type 2 diabetes mellitus with unspecified diabetic retinopathy without macular edema: Secondary | ICD-10-CM | POA: Diagnosis not present

## 2019-05-30 DIAGNOSIS — I69322 Dysarthria following cerebral infarction: Secondary | ICD-10-CM | POA: Diagnosis not present

## 2019-05-30 DIAGNOSIS — I2510 Atherosclerotic heart disease of native coronary artery without angina pectoris: Secondary | ICD-10-CM | POA: Diagnosis not present

## 2019-05-30 DIAGNOSIS — I480 Paroxysmal atrial fibrillation: Secondary | ICD-10-CM | POA: Diagnosis not present

## 2019-06-05 DIAGNOSIS — I10 Essential (primary) hypertension: Secondary | ICD-10-CM | POA: Diagnosis not present

## 2019-06-05 DIAGNOSIS — I69320 Aphasia following cerebral infarction: Secondary | ICD-10-CM | POA: Diagnosis not present

## 2019-06-05 DIAGNOSIS — E11319 Type 2 diabetes mellitus with unspecified diabetic retinopathy without macular edema: Secondary | ICD-10-CM | POA: Diagnosis not present

## 2019-06-05 DIAGNOSIS — I69322 Dysarthria following cerebral infarction: Secondary | ICD-10-CM | POA: Diagnosis not present

## 2019-06-05 DIAGNOSIS — I2510 Atherosclerotic heart disease of native coronary artery without angina pectoris: Secondary | ICD-10-CM | POA: Diagnosis not present

## 2019-06-05 DIAGNOSIS — I480 Paroxysmal atrial fibrillation: Secondary | ICD-10-CM | POA: Diagnosis not present

## 2019-06-07 DIAGNOSIS — I10 Essential (primary) hypertension: Secondary | ICD-10-CM | POA: Diagnosis not present

## 2019-06-07 DIAGNOSIS — E11319 Type 2 diabetes mellitus with unspecified diabetic retinopathy without macular edema: Secondary | ICD-10-CM | POA: Diagnosis not present

## 2019-06-07 DIAGNOSIS — I69320 Aphasia following cerebral infarction: Secondary | ICD-10-CM | POA: Diagnosis not present

## 2019-06-07 DIAGNOSIS — I69322 Dysarthria following cerebral infarction: Secondary | ICD-10-CM | POA: Diagnosis not present

## 2019-06-07 DIAGNOSIS — I480 Paroxysmal atrial fibrillation: Secondary | ICD-10-CM | POA: Diagnosis not present

## 2019-06-07 DIAGNOSIS — I2510 Atherosclerotic heart disease of native coronary artery without angina pectoris: Secondary | ICD-10-CM | POA: Diagnosis not present

## 2019-06-12 DIAGNOSIS — I480 Paroxysmal atrial fibrillation: Secondary | ICD-10-CM | POA: Diagnosis not present

## 2019-06-12 DIAGNOSIS — I69320 Aphasia following cerebral infarction: Secondary | ICD-10-CM | POA: Diagnosis not present

## 2019-06-12 DIAGNOSIS — I10 Essential (primary) hypertension: Secondary | ICD-10-CM | POA: Diagnosis not present

## 2019-06-12 DIAGNOSIS — I2510 Atherosclerotic heart disease of native coronary artery without angina pectoris: Secondary | ICD-10-CM | POA: Diagnosis not present

## 2019-06-12 DIAGNOSIS — I69322 Dysarthria following cerebral infarction: Secondary | ICD-10-CM | POA: Diagnosis not present

## 2019-06-12 DIAGNOSIS — E11319 Type 2 diabetes mellitus with unspecified diabetic retinopathy without macular edema: Secondary | ICD-10-CM | POA: Diagnosis not present

## 2019-06-14 DIAGNOSIS — I10 Essential (primary) hypertension: Secondary | ICD-10-CM | POA: Diagnosis not present

## 2019-06-14 DIAGNOSIS — I480 Paroxysmal atrial fibrillation: Secondary | ICD-10-CM | POA: Diagnosis not present

## 2019-06-14 DIAGNOSIS — I69322 Dysarthria following cerebral infarction: Secondary | ICD-10-CM | POA: Diagnosis not present

## 2019-06-14 DIAGNOSIS — I69320 Aphasia following cerebral infarction: Secondary | ICD-10-CM | POA: Diagnosis not present

## 2019-06-14 DIAGNOSIS — I2510 Atherosclerotic heart disease of native coronary artery without angina pectoris: Secondary | ICD-10-CM | POA: Diagnosis not present

## 2019-06-14 DIAGNOSIS — E11319 Type 2 diabetes mellitus with unspecified diabetic retinopathy without macular edema: Secondary | ICD-10-CM | POA: Diagnosis not present

## 2019-06-19 DIAGNOSIS — I10 Essential (primary) hypertension: Secondary | ICD-10-CM | POA: Diagnosis not present

## 2019-06-19 DIAGNOSIS — I69322 Dysarthria following cerebral infarction: Secondary | ICD-10-CM | POA: Diagnosis not present

## 2019-06-19 DIAGNOSIS — I480 Paroxysmal atrial fibrillation: Secondary | ICD-10-CM | POA: Diagnosis not present

## 2019-06-19 DIAGNOSIS — E11319 Type 2 diabetes mellitus with unspecified diabetic retinopathy without macular edema: Secondary | ICD-10-CM | POA: Diagnosis not present

## 2019-06-19 DIAGNOSIS — I69320 Aphasia following cerebral infarction: Secondary | ICD-10-CM | POA: Diagnosis not present

## 2019-06-19 DIAGNOSIS — I2510 Atherosclerotic heart disease of native coronary artery without angina pectoris: Secondary | ICD-10-CM | POA: Diagnosis not present

## 2019-06-21 DIAGNOSIS — I69320 Aphasia following cerebral infarction: Secondary | ICD-10-CM | POA: Diagnosis not present

## 2019-06-21 DIAGNOSIS — I2510 Atherosclerotic heart disease of native coronary artery without angina pectoris: Secondary | ICD-10-CM | POA: Diagnosis not present

## 2019-06-21 DIAGNOSIS — I480 Paroxysmal atrial fibrillation: Secondary | ICD-10-CM | POA: Diagnosis not present

## 2019-06-21 DIAGNOSIS — I69322 Dysarthria following cerebral infarction: Secondary | ICD-10-CM | POA: Diagnosis not present

## 2019-06-21 DIAGNOSIS — E11319 Type 2 diabetes mellitus with unspecified diabetic retinopathy without macular edema: Secondary | ICD-10-CM | POA: Diagnosis not present

## 2019-06-21 DIAGNOSIS — I10 Essential (primary) hypertension: Secondary | ICD-10-CM | POA: Diagnosis not present

## 2019-08-01 ENCOUNTER — Other Ambulatory Visit: Payer: Self-pay | Admitting: Internal Medicine

## 2019-08-03 ENCOUNTER — Encounter (HOSPITAL_COMMUNITY): Payer: Self-pay | Admitting: Emergency Medicine

## 2019-08-03 ENCOUNTER — Ambulatory Visit: Payer: Self-pay

## 2019-08-03 ENCOUNTER — Other Ambulatory Visit: Payer: Self-pay

## 2019-08-03 ENCOUNTER — Inpatient Hospital Stay (HOSPITAL_COMMUNITY)
Admission: EM | Admit: 2019-08-03 | Discharge: 2019-08-09 | DRG: 918 | Disposition: A | Discharge status: Skilled Nursing Facility | Payer: Medicare Other | Attending: Student | Admitting: Student

## 2019-08-03 DIAGNOSIS — I2510 Atherosclerotic heart disease of native coronary artery without angina pectoris: Secondary | ICD-10-CM | POA: Diagnosis present

## 2019-08-03 DIAGNOSIS — Z20828 Contact with and (suspected) exposure to other viral communicable diseases: Secondary | ICD-10-CM | POA: Diagnosis present

## 2019-08-03 DIAGNOSIS — E7800 Pure hypercholesterolemia, unspecified: Secondary | ICD-10-CM | POA: Diagnosis present

## 2019-08-03 DIAGNOSIS — Z85828 Personal history of other malignant neoplasm of skin: Secondary | ICD-10-CM

## 2019-08-03 DIAGNOSIS — E1149 Type 2 diabetes mellitus with other diabetic neurological complication: Secondary | ICD-10-CM

## 2019-08-03 DIAGNOSIS — E1140 Type 2 diabetes mellitus with diabetic neuropathy, unspecified: Secondary | ICD-10-CM | POA: Diagnosis present

## 2019-08-03 DIAGNOSIS — E11621 Type 2 diabetes mellitus with foot ulcer: Secondary | ICD-10-CM | POA: Diagnosis present

## 2019-08-03 DIAGNOSIS — G4733 Obstructive sleep apnea (adult) (pediatric): Secondary | ICD-10-CM | POA: Diagnosis present

## 2019-08-03 DIAGNOSIS — Z794 Long term (current) use of insulin: Secondary | ICD-10-CM

## 2019-08-03 DIAGNOSIS — I5032 Chronic diastolic (congestive) heart failure: Secondary | ICD-10-CM | POA: Diagnosis not present

## 2019-08-03 DIAGNOSIS — J449 Chronic obstructive pulmonary disease, unspecified: Secondary | ICD-10-CM | POA: Diagnosis present

## 2019-08-03 DIAGNOSIS — E876 Hypokalemia: Secondary | ICD-10-CM | POA: Diagnosis present

## 2019-08-03 DIAGNOSIS — I4821 Permanent atrial fibrillation: Secondary | ICD-10-CM | POA: Diagnosis not present

## 2019-08-03 DIAGNOSIS — I480 Paroxysmal atrial fibrillation: Secondary | ICD-10-CM | POA: Diagnosis present

## 2019-08-03 DIAGNOSIS — Z6837 Body mass index (BMI) 37.0-37.9, adult: Secondary | ICD-10-CM

## 2019-08-03 DIAGNOSIS — E785 Hyperlipidemia, unspecified: Secondary | ICD-10-CM | POA: Diagnosis present

## 2019-08-03 DIAGNOSIS — Z8249 Family history of ischemic heart disease and other diseases of the circulatory system: Secondary | ICD-10-CM

## 2019-08-03 DIAGNOSIS — Z803 Family history of malignant neoplasm of breast: Secondary | ICD-10-CM

## 2019-08-03 DIAGNOSIS — Z7902 Long term (current) use of antithrombotics/antiplatelets: Secondary | ICD-10-CM

## 2019-08-03 DIAGNOSIS — T501X1A Poisoning by loop [high-ceiling] diuretics, accidental (unintentional), initial encounter: Secondary | ICD-10-CM

## 2019-08-03 DIAGNOSIS — Z888 Allergy status to other drugs, medicaments and biological substances status: Secondary | ICD-10-CM

## 2019-08-03 DIAGNOSIS — T50901A Poisoning by unspecified drugs, medicaments and biological substances, accidental (unintentional), initial encounter: Secondary | ICD-10-CM | POA: Diagnosis present

## 2019-08-03 DIAGNOSIS — I959 Hypotension, unspecified: Secondary | ICD-10-CM | POA: Diagnosis not present

## 2019-08-03 DIAGNOSIS — L03115 Cellulitis of right lower limb: Secondary | ICD-10-CM | POA: Diagnosis not present

## 2019-08-03 DIAGNOSIS — E538 Deficiency of other specified B group vitamins: Secondary | ICD-10-CM | POA: Diagnosis present

## 2019-08-03 DIAGNOSIS — H1010 Acute atopic conjunctivitis, unspecified eye: Secondary | ICD-10-CM | POA: Diagnosis present

## 2019-08-03 DIAGNOSIS — T50902A Poisoning by unspecified drugs, medicaments and biological substances, intentional self-harm, initial encounter: Secondary | ICD-10-CM | POA: Diagnosis not present

## 2019-08-03 DIAGNOSIS — L97529 Non-pressure chronic ulcer of other part of left foot with unspecified severity: Secondary | ICD-10-CM | POA: Diagnosis present

## 2019-08-03 DIAGNOSIS — R32 Unspecified urinary incontinence: Secondary | ICD-10-CM | POA: Diagnosis present

## 2019-08-03 DIAGNOSIS — T50904A Poisoning by unspecified drugs, medicaments and biological substances, undetermined, initial encounter: Secondary | ICD-10-CM | POA: Diagnosis not present

## 2019-08-03 DIAGNOSIS — Z8673 Personal history of transient ischemic attack (TIA), and cerebral infarction without residual deficits: Secondary | ICD-10-CM

## 2019-08-03 DIAGNOSIS — I4891 Unspecified atrial fibrillation: Secondary | ICD-10-CM | POA: Diagnosis not present

## 2019-08-03 DIAGNOSIS — I10 Essential (primary) hypertension: Secondary | ICD-10-CM | POA: Diagnosis not present

## 2019-08-03 DIAGNOSIS — I252 Old myocardial infarction: Secondary | ICD-10-CM

## 2019-08-03 DIAGNOSIS — Z87891 Personal history of nicotine dependence: Secondary | ICD-10-CM

## 2019-08-03 DIAGNOSIS — H9190 Unspecified hearing loss, unspecified ear: Secondary | ICD-10-CM | POA: Diagnosis present

## 2019-08-03 DIAGNOSIS — Z03818 Encounter for observation for suspected exposure to other biological agents ruled out: Secondary | ICD-10-CM | POA: Diagnosis not present

## 2019-08-03 DIAGNOSIS — T887XXA Unspecified adverse effect of drug or medicament, initial encounter: Secondary | ICD-10-CM | POA: Diagnosis not present

## 2019-08-03 DIAGNOSIS — Z7982 Long term (current) use of aspirin: Secondary | ICD-10-CM

## 2019-08-03 DIAGNOSIS — I110 Hypertensive heart disease with heart failure: Secondary | ICD-10-CM | POA: Diagnosis present

## 2019-08-03 LAB — CBC WITH DIFFERENTIAL/PLATELET
Abs Immature Granulocytes: 0.04 10*3/uL (ref 0.00–0.07)
Basophils Absolute: 0 10*3/uL (ref 0.0–0.1)
Basophils Relative: 1 %
Eosinophils Absolute: 0 10*3/uL (ref 0.0–0.5)
Eosinophils Relative: 1 %
HCT: 36.3 % — ABNORMAL LOW (ref 39.0–52.0)
Hemoglobin: 11.2 g/dL — ABNORMAL LOW (ref 13.0–17.0)
Immature Granulocytes: 1 %
Lymphocytes Relative: 9 %
Lymphs Abs: 0.5 10*3/uL — ABNORMAL LOW (ref 0.7–4.0)
MCH: 29.6 pg (ref 26.0–34.0)
MCHC: 30.9 g/dL (ref 30.0–36.0)
MCV: 96 fL (ref 80.0–100.0)
Monocytes Absolute: 0.4 10*3/uL (ref 0.1–1.0)
Monocytes Relative: 8 %
Neutro Abs: 4.3 10*3/uL (ref 1.7–7.7)
Neutrophils Relative %: 80 %
Platelets: 193 10*3/uL (ref 150–400)
RBC: 3.78 MIL/uL — ABNORMAL LOW (ref 4.22–5.81)
RDW: 16.2 % — ABNORMAL HIGH (ref 11.5–15.5)
WBC: 5.3 10*3/uL (ref 4.0–10.5)
nRBC: 0 % (ref 0.0–0.2)

## 2019-08-03 LAB — COMPREHENSIVE METABOLIC PANEL
ALT: 12 U/L (ref 0–44)
AST: 22 U/L (ref 15–41)
Albumin: 3.5 g/dL (ref 3.5–5.0)
Alkaline Phosphatase: 60 U/L (ref 38–126)
Anion gap: 12 (ref 5–15)
BUN: 26 mg/dL — ABNORMAL HIGH (ref 8–23)
CO2: 27 mmol/L (ref 22–32)
Calcium: 9.5 mg/dL (ref 8.9–10.3)
Chloride: 101 mmol/L (ref 98–111)
Creatinine, Ser: 1.3 mg/dL — ABNORMAL HIGH (ref 0.61–1.24)
GFR calc Af Amer: >60 mL/min (ref >60)
GFR calc non Af Amer: 53 mL/min — ABNORMAL LOW (ref >60)
Glucose, Bld: 200 mg/dL — ABNORMAL HIGH (ref 70–99)
Potassium: 5.3 mmol/L — ABNORMAL HIGH (ref 3.5–5.1)
Sodium: 140 mmol/L (ref 135–145)
Total Bilirubin: 1.1 mg/dL (ref 0.3–1.2)
Total Protein: 6.8 g/dL (ref 6.5–8.1)

## 2019-08-03 LAB — BASIC METABOLIC PANEL
Anion gap: 12 (ref 5–15)
BUN: 24 mg/dL — ABNORMAL HIGH (ref 8–23)
CO2: 28 mmol/L (ref 22–32)
Calcium: 9.2 mg/dL (ref 8.9–10.3)
Chloride: 100 mmol/L (ref 98–111)
Creatinine, Ser: 1.16 mg/dL (ref 0.61–1.24)
GFR calc Af Amer: >60 mL/min (ref >60)
GFR calc non Af Amer: >60 mL/min (ref >60)
Glucose, Bld: 179 mg/dL — ABNORMAL HIGH (ref 70–99)
Potassium: 3.9 mmol/L (ref 3.5–5.1)
Sodium: 140 mmol/L (ref 135–145)

## 2019-08-03 LAB — MAGNESIUM
Magnesium: 1.4 mg/dL — ABNORMAL LOW (ref 1.7–2.4)
Magnesium: 1.9 mg/dL (ref 1.7–2.4)

## 2019-08-03 LAB — SARS CORONAVIRUS 2 (TAT 6-24 HRS): SARS Coronavirus 2: NEGATIVE

## 2019-08-03 LAB — URIC ACID: Uric Acid, Serum: 9.9 mg/dL — ABNORMAL HIGH (ref 3.7–8.6)

## 2019-08-03 MED ORDER — ACETAMINOPHEN 325 MG PO TABS
650.0000 mg | ORAL_TABLET | Freq: Four times a day (QID) | ORAL | Status: DC | PRN
  Administered 2019-08-04 – 2019-08-08 (×2): 650 mg via ORAL
  Filled 2019-08-03 (×2): qty 2

## 2019-08-03 MED ORDER — HEPARIN SODIUM (PORCINE) 5000 UNIT/ML IJ SOLN
5000.0000 [IU] | Freq: Three times a day (TID) | INTRAMUSCULAR | Status: DC
  Administered 2019-08-04 – 2019-08-07 (×12): 5000 [IU] via SUBCUTANEOUS
  Filled 2019-08-03 (×13): qty 1

## 2019-08-03 MED ORDER — SODIUM CHLORIDE 0.9% FLUSH: 3.0000 mL | INTRAVENOUS | Status: DC | PRN

## 2019-08-03 MED ORDER — SODIUM CHLORIDE 0.9 % IV SOLN: 250.0000 mL | INTRAVENOUS | Status: DC | PRN

## 2019-08-03 MED ORDER — POTASSIUM CHLORIDE CRYS ER 20 MEQ PO TBCR
60.0000 meq | EXTENDED_RELEASE_TABLET | Freq: Once | ORAL | Status: AC
  Administered 2019-08-03: 60 meq via ORAL
  Filled 2019-08-03: qty 3

## 2019-08-03 MED ORDER — INSULIN ASPART 100 UNIT/ML ~~LOC~~ SOLN: 0.0000 [IU] | Freq: Every day | SUBCUTANEOUS | Status: DC

## 2019-08-03 MED ORDER — MAGNESIUM SULFATE 2 GM/50ML IV SOLN
2.0000 g | Freq: Once | INTRAVENOUS | Status: AC
  Administered 2019-08-03: 2 g via INTRAVENOUS
  Filled 2019-08-03: qty 50

## 2019-08-03 MED ORDER — ACETAMINOPHEN 650 MG RE SUPP: 650.0000 mg | Freq: Four times a day (QID) | RECTAL | Status: DC | PRN

## 2019-08-03 MED ORDER — INSULIN ASPART 100 UNIT/ML ~~LOC~~ SOLN
0.0000 [IU] | Freq: Three times a day (TID) | SUBCUTANEOUS | Status: DC
  Administered 2019-08-04: 2 [IU] via SUBCUTANEOUS
  Administered 2019-08-04: 1 [IU] via SUBCUTANEOUS
  Administered 2019-08-05: 2 [IU] via SUBCUTANEOUS
  Administered 2019-08-05 – 2019-08-06 (×4): 1 [IU] via SUBCUTANEOUS
  Administered 2019-08-07 (×2): 2 [IU] via SUBCUTANEOUS
  Administered 2019-08-07: 1 [IU] via SUBCUTANEOUS
  Administered 2019-08-08: 2 [IU] via SUBCUTANEOUS
  Administered 2019-08-08: 1 [IU] via SUBCUTANEOUS
  Administered 2019-08-09 (×2): 2 [IU] via SUBCUTANEOUS

## 2019-08-03 MED ORDER — SODIUM CHLORIDE 0.9% FLUSH
3.0000 mL | Freq: Two times a day (BID) | INTRAVENOUS | Status: DC
  Administered 2019-08-04 – 2019-08-05 (×4): 3 mL via INTRAVENOUS

## 2019-08-03 MED ORDER — MORPHINE SULFATE (PF) 4 MG/ML IV SOLN
4.0000 mg | Freq: Once | INTRAVENOUS | Status: AC
  Administered 2019-08-03: 4 mg via INTRAVENOUS
  Filled 2019-08-03: qty 1

## 2019-08-03 MED ORDER — POTASSIUM CHLORIDE IN NACL 20-0.9 MEQ/L-% IV SOLN
INTRAVENOUS | Status: DC
  Administered 2019-08-03: 17:00:00 via INTRAVENOUS
  Filled 2019-08-03: qty 1000

## 2019-08-03 MED ORDER — POTASSIUM CHLORIDE IN NACL 20-0.9 MEQ/L-% IV SOLN
INTRAVENOUS | Status: AC
  Administered 2019-08-04: 04:00:00 via INTRAVENOUS
  Filled 2019-08-03 (×2): qty 1000

## 2019-08-03 NOTE — ED Notes (Signed)
According to poison control pt should stay away from QT prolongation drugs and to keep the Mg levels at a 2.

## 2019-08-03 NOTE — ED Notes (Signed)
patient

## 2019-08-03 NOTE — ED Provider Notes (Signed)
[facility] EMERGENCY DEPARTMENT Provider Note   CSN: [id number] Arrival date & time: 08/03/19  1521     History   Chief Complaint Chief Complaint  Patient presents with  . Drug Overdose    HPI Charles [name] is a 78 y.o. male.     HPI   71yM with accidental overdose of furosemide. Pt states that he keeps one day worth of his medications in a pill bottle. Today he picked a bottle of lasix thinking it was the bottle of all of his other medications for the day. Just as he took them he recognized that "it was way too many damn pills" and then realized his mistake. He estimates he took approximately #30+ 80 mg tablets of lasix sometime around 12 pm. He is urinating a lot but has no other acute complaints.   Past Medical History:  Diagnosis Date  . Arthritis   . B12 deficiency   . CAD (coronary artery disease)    a. Approx. 2000 - MI. Cath: single vsl dz, PTCA dLAD/medical rx;  b. NSTEMI 11/13 => IVUS attempted for RCA but not successful; anatomy felt stable from 2000 => med Rx. c. [place] 08/2016: severe stable diffuse dLAD, severe prox RCA -> PCI of RCA unsuccessful; d. 05/2017 NSTEMI/Cath: LM nl, LAD 20ost/p, 60m, 80d, LCX 30ost, 20p, 15m, OM2 90, RCA 80p, 99d.  . Cancer ([place])    skin cancer- head   . Cataracts   . Cholelithiasis   . Chronic diastolic CHF (congestive heart failure) ([place])    a. 05/2017 Echo: EF 60-65%, Gr2 DD, mildly dil Ao, triv MR, mildly to mod dil LA, mild PR.  . Claustrophobia   . COPD (chronic obstructive pulmonary disease) ([place])   . CVA (cerebral vascular accident) ([place]) ~ 2001   vision inparted from stroke.  Visual Memory loss  . Diabetic neuropathy ([place])   . Diverticulitis   . Dysrhythmia    if he does not take Metoprolol- Afib  . ED (erectile dysfunction)   . Essential hypertension   . History of MRSA infection    Right foot  . History of stomach ulcers   . HOH (hard of hearing)   . Hypercholesterolemia   . Legally blind   . Lower  extremity edema   . Morbid obesity ([place])   . Neuropathy   . NSTEMI (non-ST elevated myocardial infarction) ([place]) 05/2017  . OSA on CPAP   . PAF (paroxysmal atrial fibrillation) ([name])    a. confirmed by event monitor. b. 02/2014 rash on Coumadin, patient decided to discontinue Xarelto due to possible rash, cost and lawyers ads on TV, agreed to take Plavix.  . Pneumonia 2016  . Tunnel vision    Since stroke  . Type II diabetes mellitus ([place])   . Venous stasis   . Weakness 01/11/2017    Patient Active Problem List   Diagnosis Date Noted  . Urinary incontinence 05/25/2019  . Dysarthria 02/21/2019  . Humerus fracture 08/23/2018  . Chronic gingivitis 08/16/2017  . NSTEMI (non-ST elevated myocardial infarction) ([place]) 06/01/2017  . Scaly skin 05/16/2017  . C. difficile colitis 01/31/2017  . PAT (paroxysmal atrial tachycardia) ([place]) 01/15/2017  . HLD (hyperlipidemia) 01/15/2017  . Hypoglycemia 01/11/2017  . Demand ischemia of myocardium ([place])   . Diarrhea 01/10/2017  . Elevated troponin 01/10/2017  . Dehydration 01/10/2017  . Hypoglycemia due to type 2 diabetes mellitus ([place]) 01/10/2017  . Hypoglycemia associated with diabetes ([place]) 10/17/2016  . Chronic ulcer of  left foot ([place]) 08/31/2016  . Acute on chronic diastolic congestive heart failure ([place]) 08/20/2016  . Venous stasis   . Lower extremity edema   . Hypercholesterolemia   . Diabetic neuropathy ([place])   . Unstable angina ([place]) 08/15/2016  . 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 Pressure ulcer 12/27/2015  . Gangrene of toe ([place]) 12/26/2015  . Acute renal failure (ARF) ([place]) 12/26/2015  . Subconjunctival hemorrhage 10/21/2015  . Cellulitis in diabetic foot ([place])   . Cellulitis 05/06/2015  . COPD exacerbation ([place]) 10/10/2014  . Hematuria 08/12/2014  . Hypokalemia 08/12/2014  . Urinary retention 08/03/2014  . Hypotension 07/26/2014  . COPD  07/26/2014  . Rash and nonspecific skin eruption 02/13/2014  . PAF (paroxysmal  atrial fibrillation) ([place]) 11/13/2013  . Morbid obesity ([place]) 05/24/2012  . Type 2 diabetes, uncontrolled, with retinopathy ([place]) 02/09/2012  . NEOPLASM OF UNCERTAIN BEHAVIOR OF SKIN 12/24/2010  . WEIGHT GAIN, ABNORMAL 02/02/2010  . TOBACCO USE, QUIT 02/02/2010  . DM type 2 causing neurological disease ([place]) 07/12/2007  . HYPOGONADISM 07/12/2007  . B12 deficiency 07/12/2007  . Essential hypertension 07/12/2007  . MYOCARDIAL INFARCTION, HX OF 07/12/2007  . CAD (coronary artery disease) 07/12/2007  . History of CVA (cerebrovascular accident) 07/12/2007  . VENOUS INSUFFICIENCY, LEGS 07/12/2007  . SYMPTOM, MEMORY LOSS 07/12/2007  . DIVERTICULITIS, HX OF 07/12/2007    Past Surgical History:  Procedure Laterality Date  . CARDIAC CATHETERIZATION  1990's  . CARDIAC CATHETERIZATION N/A 08/17/2016   Procedure: Left Heart Cath and Coronary Angiography;  Surgeon: Peter M Martinique, MD;  Location: [facility];  Service: Cardiovascular;  Laterality: N/A;  . CARDIAC CATHETERIZATION N/A 08/19/2016   Procedure: Coronary Balloon Angioplasty;  Surgeon: Peter M Martinique, MD;  Location: [facility];  Service: Cardiovascular;  Laterality: N/A;  . CATARACT EXTRACTION  10/2016  . CEREBRAL ANGIOGRAM  ~ 2000  . COLONOSCOPY    . LEFT HEART CATH AND CORONARY ANGIOGRAPHY N/A 06/01/2017   Procedure: Left Heart Cath and Coronary Angiography;  Surgeon: Burnell Blanks, MD;  Location: [facility];  Service: Cardiovascular;  Laterality: N/A;  . LEFT HEART CATHETERIZATION WITH CORONARY ANGIOGRAM N/A 10/27/2012   Procedure: LEFT HEART CATHETERIZATION WITH CORONARY ANGIOGRAM;  Surgeon: Burnell Blanks, MD;  Location[facility];  Service: Cardiovascular;  Laterality: N/A;  . TOE AMPUTATION  2006; 2009   "Dr. Blenda Mounts; big toe left foot; little toe on right foot" (10/26/2012)  . TOE AMPUTATION Right 12/24/2015   2nd & 3 toes/notes 1/13//2017  . TOE AMPUTATION Right    5TH TOE  .  TRANSMETATARSAL AMPUTATION Right 10/13/2016   Procedure: TRANSMETATARSAL AMPUTATION;  Surgeon: Trula Slade, DPM;  Location: [facility];  Service: Podiatry;  Laterality: Right;        Home Medications    Prior to Admission medications   Medication Sig Start Date End Date Taking? Authorizing Provider  acetaminophen (TYLENOL) 500 MG tablet Take 1,000 mg by mouth at bedtime.     [provider]  aspirin EC 81 MG tablet Take 81 mg by mouth daily.     [provider]  cholecalciferol (VITAMIN D) 1000 units tablet Take 1,000 Units by mouth daily.     [provider]  clopidogrel (PLAVIX) 75 MG tablet TAKE 1 TABLET EVERY DAY 05/08/19   Plotnikov, Evie Lacks, MD  collagenase (SANTYL) ointment Apply 1 application topically daily. 11/29/16   Trula Slade, DPM  doxycycline (VIBRA-TABS) 100 MG tablet Take  1 tablet (100 mg total) by mouth 2 (two) times daily. 05/25/19   Plotnikov, Evie Lacks, MD  Fluticasone-Salmeterol (ADVAIR DISKUS) 100-50 MCG/DOSE AEPB Inhale 1 puff into the lungs 2 (two) times daily. 03/02/16   Plotnikov, Evie Lacks, MD  furosemide (LASIX) 80 MG tablet TAKE 1/2 TABLET EVERY OTHER DAILY ALTERNATING WITH 1 TABLET EVERY OTHER DAY 08/04/18   Plotnikov, Evie Lacks, MD  gabapentin (NEURONTIN) 300 MG capsule TAKE 1 CAPSULE THREE TIMES DAILY 08/04/18   Plotnikov, Evie Lacks, MD  insulin aspart (NOVOLOG) 100 UNIT/ML FlexPen Inject 20 Units into the skin 4 (four) times daily - after meals and at bedtime. Follow SS 08/23/18   Plotnikov, Evie Lacks, MD  isosorbide mononitrate (IMDUR) 60 MG 24 hr tablet TAKE 1 TABLET EVERY DAY 05/08/19   Plotnikov, Evie Lacks, MD  metFORMIN (GLUCOPHAGE) 500 MG tablet TAKE 1 TABLET TWICE DAILY WITH A MEAL 05/08/19   Plotnikov, Evie Lacks, MD  metoprolol tartrate (LOPRESSOR) 25 MG tablet Take 1 tablet (25 mg total) by mouth 2 (two) times daily. 08/04/18   Plotnikov, Evie Lacks, MD  nitroGLYCERIN (NITROSTAT) 0.4 MG SL tablet DISSOLVE 1 TABLET (0.4 MG  TOTAL) UNDER THE TONGUE EVERY 5 (FIVE) MINUTES AS NEEDED (UP TO 3 DOSES) FOR CHEST PAIN 05/08/19   Plotnikov, Evie Lacks, MD  pioglitazone (ACTOS) 45 MG tablet TAKE 1 TABLET EVERY DAY 05/08/19   Plotnikov, Evie Lacks, MD  pravastatin (PRAVACHOL) 40 MG tablet TAKE 1 TABLET EVERY DAY 05/08/19   Plotnikov, Evie Lacks, MD  ranolazine (RANEXA) 500 MG 12 hr tablet TAKE 1 TABLET TWICE DAILY 08/03/18   Plotnikov, Evie Lacks, MD  tolterodine (DETROL LA) 4 MG 24 hr capsule Take 1 capsule (4 mg total) by mouth daily. 05/25/19   Plotnikov, Evie Lacks, MD  triamcinolone ointment (KENALOG) 0.5 % Apply 1 application topically 2 (two) times daily. 02/21/19 02/21/20  Plotnikov, Evie Lacks, MD  vitamin B-12 (CYANOCOBALAMIN) 1000 MCG tablet Take 1,000 mcg by mouth daily.     [provider]    Family History Family History  Problem Relation Age of Onset  . Breast cancer Mother 58  . Heart disease Father 32  . Asthma Neg Hx     Social History Social History   Tobacco Use  . Smoking status: Former Smoker    Packs/day: 1.00    Years: 30.00    Pack years: 30.00    Types: Cigarettes, Cigars  . Smokeless tobacco: Former Systems developer    Quit date: 08/15/2016  . Tobacco comment: 12/2016 quit smoking in 1998  Substance Use Topics  . Alcohol use: Yes    Comment: 12/2016   rare   . Drug use: No     Allergies   Lipitor [atorvastatin] and Xarelto [rivaroxaban]   Review of Systems Review of Systems All systems reviewed and negative, other than as noted in HPI.   Physical Exam Updated Vital Signs BP 136/74 (BP Location: Right Arm)   Pulse (!) 105   Temp 97.7 F (36.5 C) (Oral)   Resp 19   SpO2 95%   Physical Exam Vitals signs and nursing note reviewed.  Constitutional:      General: He is not in acute distress.    Appearance: He is well-developed. He is obese. He is not ill-appearing or toxic-appearing.  HENT:     Head: Normocephalic and atraumatic.  Eyes:     General:        Right eye: No discharge.  Left eye: No discharge.     Conjunctiva/sclera: Conjunctivae normal.  Neck:     Musculoskeletal: Neck supple.  Cardiovascular:     Rate and Rhythm: Rhythm irregular.     Heart sounds: Normal heart sounds. No murmur. No friction rub. No gallop.      Comments: Mild tachycardia Pulmonary:     Effort: Pulmonary effort is normal. No respiratory distress.     Breath sounds: Normal breath sounds.  Abdominal:     General: There is no distension.     Palpations: Abdomen is soft.     Tenderness: There is no abdominal tenderness.  Musculoskeletal:        General: No tenderness.     Comments: Swelling and chronic stasis dermatitis of b/l LE. All toes amputated R foot and big toe L foot.   Skin:    General: Skin is warm and dry.  Neurological:     Mental Status: He is alert.  Psychiatric:        Behavior: Behavior normal.        Thought Content: Thought content normal.      ED Treatments / Results  Labs (all labs ordered are listed, but only abnormal results are displayed) Labs Reviewed  COMPREHENSIVE METABOLIC PANEL - Abnormal; Notable for the following components:      Result Value   Potassium 5.3 (*)    Glucose, Bld 200 (*)    BUN 26 (*)    Creatinine, Ser 1.30 (*)    GFR calc non Af Amer 53 (*)    All other components within normal limits  CBC WITH DIFFERENTIAL/PLATELET - Abnormal; Notable for the following components:   RBC 3.78 (*)    Hemoglobin 11.2 (*)    HCT 36.3 (*)    RDW 16.2 (*)    Lymphs Abs 0.5 (*)    All other components within normal limits  MAGNESIUM - Abnormal; Notable for the following components:   Magnesium 1.4 (*)    All other components within normal limits  URIC ACID - Abnormal; Notable for the following components:   Uric Acid, Serum 9.9 (*)    All other components within normal limits  BASIC METABOLIC PANEL - Abnormal; Notable for the following components:   Glucose, Bld 179 (*)    BUN 24 (*)    All other components within normal limits   SARS CORONAVIRUS 2  MAGNESIUM    EKG EKG Interpretation  Date/Time:  Friday August 03 2019 15:52:07 EDT Ventricular Rate:  91 PR Interval:    QRS Duration: 121 QT Interval:  388 QTC Calculation: 478 R Axis:   105 Text Interpretation:  Atrial fibrillation Nonspecific intraventricular conduction delay Inferior infarct, old Probable anterolateral infarct, old Confirmed by Virgel Manifold [phone]) on 08/03/2019 4:02:50 PM   Radiology No results found.  Procedures Procedures (including critical care time)  CRITICAL CARE Performed by: Virgel Manifold Total critical care time: 40 minutes Critical care time was exclusive of separately billable procedures and treating other patients. Critical care was necessary to treat or prevent imminent or life-threatening deterioration. Critical care was time spent personally by me on the following activities: development of treatment plan with patient and/or surrogate as well as nursing, discussions with consultants, evaluation of patient's response to treatment, examination of patient, obtaining history from patient or surrogate, ordering and performing treatments and interventions, ordering and review of laboratory studies, ordering and review of radiographic studies, pulse oximetry and re-evaluation of patient's condition.   Medications Ordered in ED  Medications  potassium chloride SA (K-DUR) CR tablet 60 mEq (has no administration in time range)  0.9 % NaCl with KCl 20 mEq/ L  infusion (has no administration in time range)     Initial Impression / Assessment and Plan / ED Course  I have reviewed the triage vital signs and the nursing notes.  Pertinent labs & imaging results that were available during my care of the patient were reviewed by me and considered in my medical decision making (see chart for details).     77yM with unintentional overdose of 2,000+ mg of furosemide. Diuresing as expected otherwise he has no specific complaints.  Initial BP is fine. EKG with afib with rate 110-110 and non-specific intraventricular delay. He has a history of afib.   Initially ordered 60 mg of PO potassium and IVF with additional KCL. He will need admitted for observation on telemetry and serial metabolic panels. Condom cath placed to monitor I/Os.  Need to keep a close eye on hemodynamics/volume status, electrolytes, renal function, cardiac conduction.  Pt advised to get himself a real pill organizer.   4:15 PM No new complaints. Remains normotensive. HR ~100.   5:21 PM No significant change in vitals. Pt remains w/o additional complaints.   Initial potassium actually came back slightly elevated. IVF with KCL stopped. Repeat BMP showing stable renal function and potassium normal. Magnesium supplemented. He has been stable through ED stay at this point and will probably be fine. Poison control recommending keeping Mag near 2 and observation.   Final Clinical Impressions(s) / ED Diagnoses   Final diagnoses:  Intentional drug overdose, initial encounter [facility])  Accidental furosemide poisoning, initial encounter    ED Discharge Orders    None       Virgel Manifold, MD 08/03/19 2310

## 2019-08-03 NOTE — Telephone Encounter (Signed)
Wife reports pt. May have taken 20 pills of Lasix at 11:00 - 80 mg tab. No symptoms currently. Wife reports he has a lot "of edema in legs and into abdomen."  He is not voiding any more than usual today. Poison Control notified. Barnett Applebaum at [employer] recommends transport to ED via EMS. States she will notify [facility] pt. Is coming in. Wife verbalizes understanding and will call 911.  Reason for Disposition . Sounds like a life-threatening emergency to the triager  Answer Assessment - Initial Assessment Questions 1. SUBSTANCE: "What was swallowed?" If necessary, have the caller look at the label on the container.      Maybe swallowed 20 Lasix 2. AMOUNT: "How much was swallowed?" (Err on the side of recording the maximal amount that is missing)      20 tablets 3. ONSET: "When was it probably swallowed?" (Minutes or hours ago)      11:00 today 4. SYMPTOMS: "Do you have any symptoms?" If so, ask: "What are they?" (e.g., abdominal pain, vomiting, weakness)      None 5. SUICIDAL: "Did you take this to hurt or kill yourself?"     No 6. PREGNANCY: "Is there any chance you are pregnant?" "When was your last menstrual period?"     n/a  Protocols used: POISONING-A-AH

## 2019-08-03 NOTE — ED Triage Notes (Addendum)
Pt arrives to ED from home with complaints of accidentally taking an unknown amount of expired lasix. Patient states he thinks he took 30-40 80mg  pills. Patient usually takes his medication out of an old pill bottle and he got it confused with his lasix bottle today. Patient denies any pain and this time and has an output of 618ml at this time.

## 2019-08-03 NOTE — H&P (Signed)
TRH H&P    Patient Demographics:    Charles [name], is a 78 y.o. male  MRN: [id number]  DOB - 02/08/1941  Admit Date - 08/03/2019  Referring MD/NP/PA:  Virgel Manifold  Outpatient Primary MD for the patient is Plotnikov, Evie Lacks, MD  Patient coming from:  home  Chief complaint-  Overdose on Lasix, unintentional   HPI:    Charles [name]  is a 78 y.o. 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 Also notes redness of the right distal lower ext for the past 1 week.    In ED,  T 97.7 P 105, R 19, Bp 136/74  Pox 95% on RA  Na 140, K 5.3, Bun 26, Creatinine 1.3 Na 140, K 3.9, Bun 24, Creatinine 1.16 Glucose 179 Wbc 5.3, Hgb 11.2, Plt 193 Magnesium 1.4  -> repeat magnesium 1.9 Uric acid 9.9 covid -19 negative  Pt given magnesium sulfate 2gm iv x1, and also Kcl 60 meq po x1,   Poison control per ED recommended monitoring his renal function as well as potassium over nite.   Pt will be admitted for unintentional overdose on lasix as well as cellulits.      Review of systems:    In addition to the HPI above,  No Fever-chills, No Headache, No changes with Vision or hearing, No problems swallowing food or Liquids, No Chest pain, Cough or Shortness of Breath, No Abdominal pain, No Nausea or Vomiting, bowel movements are regular, No Blood in stool or Urine, No dysuria,  No new joints pains-aches,  No new weakness, tingling, numbness in any extremity, No recent weight gain or loss, No polyuria, polydypsia or polyphagia, No significant Mental Stressors.  All other systems reviewed and are negative.    Past History of the following :    Past Medical History:  Diagnosis  Date  . Arthritis   . B12 deficiency   . CAD (coronary artery disease)    a. Approx. 2000 - MI. Cath: single vsl dz, PTCA dLAD/medical rx;  b. NSTEMI 11/13 => IVUS attempted for RCA but not successful; anatomy felt stable from 2000 => med Rx. c. [place] 08/2016: severe stable diffuse dLAD, severe prox RCA -> PCI of RCA unsuccessful; d. 05/2017 NSTEMI/Cath: LM nl, LAD 20ost/p, 40m, 80d, LCX 30ost, 20p, 25m, OM2 90, RCA 80p, 99d.  . Cancer ([place])    skin cancer- head   . Cataracts   . Cholelithiasis   . Chronic diastolic CHF (congestive heart failure) ([place])    a. 05/2017 Echo: EF 60-65%, Gr2 DD, mildly dil Ao, triv MR, mildly to mod dil LA, mild PR.  . Claustrophobia   . COPD (chronic obstructive pulmonary disease) ([place])   . CVA (cerebral vascular accident) ([place]) ~ 2001   vision inparted from stroke.  Visual Memory loss  . Diabetic neuropathy ([place])   .  Diverticulitis   . Dysrhythmia    if he does not take Metoprolol- Afib  . ED (erectile dysfunction)   . Essential hypertension   . History of MRSA infection    Right foot  . History of stomach ulcers   . HOH (hard of hearing)   . Hypercholesterolemia   . Legally blind   . Lower extremity edema   . Morbid obesity ([place])   . Neuropathy   . NSTEMI (non-ST elevated myocardial infarction) ([place]) 05/2017  . OSA on CPAP   . PAF (paroxysmal atrial fibrillation) ([name])    a. confirmed by event monitor. b. 02/2014 rash on Coumadin, patient decided to discontinue Xarelto due to possible rash, cost and lawyers ads on TV, agreed to take Plavix.  . Pneumonia 2016  . Tunnel vision    Since stroke  . Type II diabetes mellitus ([place])   . Venous stasis   . Weakness 01/11/2017      Past Surgical History:  Procedure Laterality Date  . CARDIAC CATHETERIZATION  1990's  . CARDIAC CATHETERIZATION N/A 08/17/2016   Procedure: Left Heart Cath and Coronary Angiography;  Surgeon: Peter M Martinique, MD;  Location: [facility];  Service: Cardiovascular;  Laterality:  N/A;  . CARDIAC CATHETERIZATION N/A 08/19/2016   Procedure: Coronary Balloon Angioplasty;  Surgeon: Peter M Martinique, MD;  Location: [facility];  Service: Cardiovascular;  Laterality: N/A;  . CATARACT EXTRACTION  10/2016  . CEREBRAL ANGIOGRAM  ~ 2000  . COLONOSCOPY    . LEFT HEART CATH AND CORONARY ANGIOGRAPHY N/A 06/01/2017   Procedure: Left Heart Cath and Coronary Angiography;  Surgeon: Burnell Blanks, MD;  Location: [facility];  Service: Cardiovascular;  Laterality: N/A;  . LEFT HEART CATHETERIZATION WITH CORONARY ANGIOGRAM N/A 10/27/2012   Procedure: LEFT HEART CATHETERIZATION WITH CORONARY ANGIOGRAM;  Surgeon: Burnell Blanks, MD;  Location: [facility];  Service: Cardiovascular;  Laterality: N/A;  . TOE AMPUTATION  2006; 2009   "Dr. Blenda Mounts; big toe left foot; little toe on right foot" (10/26/2012)  . TOE AMPUTATION Right 12/24/2015   2nd & 3 toes/notes 1/13//2017  . TOE AMPUTATION Right    5TH TOE  . TRANSMETATARSAL AMPUTATION Right 10/13/2016   Procedure: TRANSMETATARSAL AMPUTATION;  Surgeon: Trula Slade, DPM;  Location: [facility];  Service: Podiatry;  Laterality: Right;      Social History:      Social History   Tobacco Use  . Smoking status: Former Smoker    Packs/day: 1.00    Years: 30.00    Pack years: 30.00    Types: Cigarettes, Cigars  . Smokeless tobacco: Former Systems developer    Quit date: 08/15/2016  . Tobacco comment: 12/2016 quit smoking in 1998  Substance Use Topics  . Alcohol use: Yes    Comment: 12/2016   rare        Family History :     Family History  Problem Relation Age of Onset  . Breast cancer Mother 34  . Heart disease Father 55  . Asthma Neg Hx        Home Medications:   Prior to Admission medications   Medication Sig Start Date End Date Taking? Authorizing Provider  acetaminophen (TYLENOL) 500 MG tablet Take 1,000 mg by mouth at bedtime.     [provider]  aspirin EC 81 MG tablet Take 81 mg by mouth daily.      [provider]  cholecalciferol (VITAMIN D) 1000 units tablet  Take 1,000 Units by mouth daily.     [provider]  clopidogrel (PLAVIX) 75 MG tablet TAKE 1 TABLET EVERY DAY 05/08/19   Plotnikov, Evie Lacks, MD  collagenase (SANTYL) ointment Apply 1 application topically daily. 11/29/16   Trula Slade, DPM  doxycycline (VIBRA-TABS) 100 MG tablet Take 1 tablet (100 mg total) by mouth 2 (two) times daily. 05/25/19   Plotnikov, Evie Lacks, MD  Fluticasone-Salmeterol (ADVAIR DISKUS) 100-50 MCG/DOSE AEPB Inhale 1 puff into the lungs 2 (two) times daily. 03/02/16   Plotnikov, Evie Lacks, MD  furosemide (LASIX) 80 MG tablet TAKE 1/2 TABLET EVERY OTHER DAILY ALTERNATING WITH 1 TABLET EVERY OTHER DAY 08/04/18   Plotnikov, Evie Lacks, MD  gabapentin (NEURONTIN) 300 MG capsule TAKE 1 CAPSULE THREE TIMES DAILY 08/04/18   Plotnikov, Evie Lacks, MD  insulin aspart (NOVOLOG) 100 UNIT/ML FlexPen Inject 20 Units into the skin 4 (four) times daily - after meals and at bedtime. Follow SS 08/23/18   Plotnikov, Evie Lacks, MD  isosorbide mononitrate (IMDUR) 60 MG 24 hr tablet TAKE 1 TABLET EVERY DAY 05/08/19   Plotnikov, Evie Lacks, MD  metFORMIN (GLUCOPHAGE) 500 MG tablet TAKE 1 TABLET TWICE DAILY WITH A MEAL 05/08/19   Plotnikov, Evie Lacks, MD  metoprolol tartrate (LOPRESSOR) 25 MG tablet Take 1 tablet (25 mg total) by mouth 2 (two) times daily. 08/04/18   Plotnikov, Evie Lacks, MD  nitroGLYCERIN (NITROSTAT) 0.4 MG SL tablet DISSOLVE 1 TABLET (0.4 MG TOTAL) UNDER THE TONGUE EVERY 5 (FIVE) MINUTES AS NEEDED (UP TO 3 DOSES) FOR CHEST PAIN 05/08/19   Plotnikov, Evie Lacks, MD  pioglitazone (ACTOS) 45 MG tablet TAKE 1 TABLET EVERY DAY 05/08/19   Plotnikov, Evie Lacks, MD  pravastatin (PRAVACHOL) 40 MG tablet TAKE 1 TABLET EVERY DAY 05/08/19   Plotnikov, Evie Lacks, MD  ranolazine (RANEXA) 500 MG 12 hr tablet TAKE 1 TABLET TWICE DAILY 08/03/18   Plotnikov, Evie Lacks, MD  tolterodine (DETROL LA) 4 MG 24 hr capsule Take  1 capsule (4 mg total) by mouth daily. 05/25/19   Plotnikov, Evie Lacks, MD  triamcinolone ointment (KENALOG) 0.5 % Apply 1 application topically 2 (two) times daily. 02/21/19 02/21/20  Plotnikov, Evie Lacks, MD  vitamin B-12 (CYANOCOBALAMIN) 1000 MCG tablet Take 1,000 mcg by mouth daily.     [provider]     Allergies:     Allergies  Allergen Reactions  . Lipitor [Atorvastatin]     achy  . Xarelto [Rivaroxaban] Rash     Physical Exam:   Vitals  Blood pressure 117/68, pulse 90, temperature 97.7 F (36.5 C), temperature source Oral, resp. rate 18, SpO2 93 %.  1.  General: aoxox3  2. Psychiatric: euthymic  3. Neurologic: cn2-12 intact, reflexes 2+ symmetric, diffuse w no clonus, motor 5/5 in all 4 ext  4. HEENMT:  Anicteric, pupils 1.54mm symmetric, direct, consensual near intact Tongue dry, ip dry Neck:no jvd  5. Respiratory : CTAB  6. Cardiovascular : Irr, irr, s1, s2, no m/g/r 7. Gastrointestinal:  Abd: soft, nt, nd, +bs  8. Skin:  Ext: no c/c, 1+ edema, dry scaly skin on the bilateral distal lower ext and feet Onychomycosis,  Transmetatarsal amputation on right foot and 1st toe amputation left foot,  Slight hammer toes left foot Slight warm and redness of the right distal lower ext from ankle to 3/4 up to knee   9.Musculoskeletal:  Good ROM    Data Review:    CBC Recent Labs  Lab 08/03/19  1647  WBC 5.3  HGB 11.2*  HCT 36.3*  PLT 193  MCV 96.0  MCH 29.6  MCHC 30.9  RDW 16.2*  LYMPHSABS 0.5*  MONOABS 0.4  EOSABS 0.0  BASOSABS 0.0   ------------------------------------------------------------------------------------------------------------------  Results for orders placed or performed during the hospital encounter of 08/03/19 (from the past 48 hour(s))  Comprehensive metabolic panel     Status: Abnormal   Collection Time: 08/03/19  4:47 PM  Result Value Ref Range   Sodium 140 135 - 145 mmol/L   Potassium 5.3 (H) 3.5 - 5.1 mmol/L    Chloride 101 98 - 111 mmol/L   CO2 27 22 - 32 mmol/L   Glucose, Bld 200 (H) 70 - 99 mg/dL   BUN 26 (H) 8 - 23 mg/dL   Creatinine, Ser 1.30 (H) 0.61 - 1.24 mg/dL   Calcium 9.5 8.9 - 10.3 mg/dL   Total Protein 6.8 6.5 - 8.1 g/dL   Albumin 3.5 3.5 - 5.0 g/dL   AST 22 15 - 41 U/L   ALT 12 0 - 44 U/L   Alkaline Phosphatase 60 38 - 126 U/L   Total Bilirubin 1.1 0.3 - 1.2 mg/dL   GFR calc non Af Amer 53 (L) >60 mL/min   GFR calc Af Amer >60 >60 mL/min   Anion gap 12 5 - 15    Comment: Performed at [facility], [address] [place], [place] [address]  CBC with Differential     Status: Abnormal   Collection Time: 08/03/19  4:47 PM  Result Value Ref Range   WBC 5.3 4.0 - 10.5 K/uL   RBC 3.78 (L) 4.22 - 5.81 MIL/uL   Hemoglobin 11.2 (L) 13.0 - 17.0 g/dL   HCT 36.3 (L) 39.0 - 52.0 %   MCV 96.0 80.0 - 100.0 fL   MCH 29.6 26.0 - 34.0 pg   MCHC 30.9 30.0 - 36.0 g/dL   RDW 16.2 (H) 11.5 - 15.5 %   Platelets 193 150 - 400 K/uL   nRBC 0.0 0.0 - 0.2 %   Neutrophils Relative % 80 %   Neutro Abs 4.3 1.7 - 7.7 K/uL   Lymphocytes Relative 9 %   Lymphs Abs 0.5 (L) 0.7 - 4.0 K/uL   Monocytes Relative 8 %   Monocytes Absolute 0.4 0.1 - 1.0 K/uL   Eosinophils Relative 1 %   Eosinophils Absolute 0.0 0.0 - 0.5 K/uL   Basophils Relative 1 %   Basophils Absolute 0.0 0.0 - 0.1 K/uL   Immature Granulocytes 1 %   Abs Immature Granulocytes 0.04 0.00 - 0.07 K/uL    Comment: Performed at [facility], [place] [address], [place], [place] [address]  Magnesium     Status: Abnormal   Collection Time: 08/03/19  4:47 PM  Result Value Ref Range   Magnesium 1.4 (L) 1.7 - 2.4 mg/dL    Comment: Performed at [facility] [address] [place], [place] [address]  SARS CORONAVIRUS 2 Nasal Swab Aptima Multi Swab     Status: None   Collection Time: 08/03/19  4:47 PM   Specimen: Aptima Multi Swab; Nasal Swab  Result Value Ref Range   SARS Coronavirus 2 NEGATIVE NEGATIVE    Comment: (NOTE)  SARS-CoV-2 target nucleic acids are NOT DETECTED. The SARS-CoV-2 RNA is generally detectable in upper and lower respiratory specimens during the acute phase of infection. Negative results do not preclude SARS-CoV-2 infection, do not rule out co-infections with other  pathogens, and should not be used as the sole basis for treatment or other patient management decisions. Negative results must be combined with clinical observations, patient history, and epidemiological information. The expected result is Negative. Fact Sheet for Patients: SugarRoll.be Fact Sheet for Healthcare Providers: https://www.woods-mathews.com/ This test is not yet approved or cleared by the [place] FDA and  has been authorized for detection and/or diagnosis of SARS-CoV-2 by FDA under an Emergency Use Authorization (EUA). This EUA will remain  in effect (meaning this test can be used) for the duration of the COVID-19 declaration under Section 56 4(b)(1) of the Act, 21 U.S.C. section 360bbb-3(b)(1), unless the authorization is terminated or revoked sooner. Performed at [facility], [place] [address], [place], [place] [address]   Uric acid     Status: Abnormal   Collection Time: 08/03/19  4:47 PM  Result Value Ref Range   Uric Acid, Serum 9.9 (H) 3.7 - 8.6 mg/dL    Comment: Performed at [facility] [address], [place], [place] [id number]  Basic metabolic panel     Status: Abnormal   Collection Time: 08/03/19  7:45 PM  Result Value Ref Range   Sodium 140 135 - 145 mmol/L   Potassium 3.9 3.5 - 5.1 mmol/L    Comment: DELTA CHECK NOTED   Chloride 100 98 - 111 mmol/L   CO2 28 22 - 32 mmol/L   Glucose, Bld 179 (H) 70 - 99 mg/dL   BUN 24 (H) 8 - 23 mg/dL   Creatinine, Ser 1.16 0.61 - 1.24 mg/dL   Calcium 9.2 8.9 - 10.3 mg/dL   GFR calc non Af Amer >60 >60 mL/min   GFR calc Af Amer >60 >60 mL/min   Anion gap 12 5 - 15    Comment: Performed at [facility], [place] [address], [place], [place] [address]  Magnesium     Status: None   Collection Time: 08/03/19  9:18 PM  Result Value Ref Range   Magnesium 1.9 1.7 - 2.4 mg/dL    Comment: Performed at [facility], [place] [address] [place], [place] [address]    Chemistries  Recent Labs  Lab 08/03/19 1647 08/03/19 1945 08/03/19 2118  NA 140 140  --   K 5.3* 3.9  --   CL 101 100  --   CO2 27 28  --   GLUCOSE 200* 179*  --   BUN 26* 24*  --   CREATININE 1.30* 1.16  --   CALCIUM 9.5 9.2  --   MG 1.4*  --  1.9  AST 22  --   --   ALT 12  --   --   ALKPHOS 60  --   --   BILITOT 1.1  --   --    ------------------------------------------------------------------------------------------------------------------  ------------------------------------------------------------------------------------------------------------------ GFR: CrCl cannot be calculated (Unknown ideal weight.). Liver Function Tests: Recent Labs  Lab 08/03/19 1647  AST 22  ALT 12  ALKPHOS 60  BILITOT 1.1  PROT 6.8  ALBUMIN 3.5   No results for input(s): LIPASE, AMYLASE in the last 168 hours. No results for input(s): AMMONIA in the last 168 hours. Coagulation Profile: No results for input(s): INR, PROTIME in the last 168 hours. Cardiac Enzymes: No results for input(s): CKTOTAL, CKMB, CKMBINDEX, TROPONINI in the last 168 hours. BNP (last 3 results) No results for input(s): PROBNP in the last 8760 hours. HbA1C: No results for input(s): HGBA1C in the last 72 hours.  CBG: No results for input(s): GLUCAP in the last 168 hours. Lipid Profile: No results for input(s): CHOL, HDL, LDLCALC, TRIG, CHOLHDL, LDLDIRECT in the last 72 hours. Thyroid Function Tests: No results for input(s): TSH, T4TOTAL, FREET4, T3FREE, THYROIDAB in the last 72 hours. Anemia Panel: No results for input(s): VITAMINB12, FOLATE, FERRITIN, TIBC, IRON, RETICCTPCT in the last 72 hours.   --------------------------------------------------------------------------------------------------------------- Urine analysis:    Component Value Date/Time   COLORURINE YELLOW 05/25/2019 [place] 05/25/2019 1353   LABSPEC 1.020 05/25/2019 1353   PHURINE 6.0 05/25/2019 1353   GLUCOSEU NEGATIVE 05/25/2019 1353   HGBUR NEGATIVE 05/25/2019 1353   BILIRUBINUR NEGATIVE 05/25/2019 1353   KETONESUR NEGATIVE 05/25/2019 1353   PROTEINUR NEGATIVE 01/10/2017 1639   UROBILINOGEN 1.0 05/25/2019 1353   NITRITE NEGATIVE 05/25/2019 1353   LEUKOCYTESUR SMALL (A) 05/25/2019 1353      Imaging Results:    No results found. 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[place])   B12 deficiency   Essential hypertension   CAD (coronary artery disease)   History of CVA (cerebrovascular accident)   PAF (paroxysmal atrial fibrillation) ([name])  Unitentional overdose on Lasix Monitor potassium, creatinine and magnesium Check bmp at 11pm, check magnesium, cmp in am  H/o CVA Cont Aspirin Cont Plavix 75mg  po qday Cont Pravastatin 40mg  po qhs  CAD , chronic diastolic CHF  Cont Aspirin, plavix as above Cont Pravastatin as above Cont Ranexa 500mg  po bid Cont Lopressor 25mg  po bid Cont Imdur 60mg  po qday Hold Furosemide due to overdose  Pafib Cont lopressor as above,  Per Dr. Lysbeth Penner note on 01/13/2017 pt has refused anticoagulation in the past  Dm2 fsbs ac and qhs, ISS Cont Actos 45mg  po qday Cont Metformin 500mg  po bid (note this has a relative contraindication w CHF, might benefit from [place] which has reduction in CHF)  Copd Cont Advair 100/50 1puff bid Cont Albuterol prn   Cellulitis (mild), right distal lower ext  Skin tear dorum of the left foot Skin ulcer on the lateral aspect of the left foot near 1st mtp  amputation Wound care Wound care consult Doxycycline 100mg  iv bid  Urinary incontinence Detrol-> Toviaz   DVT Prophylaxis-   Heparin - SCDs   AM Labs Ordered, also please review Full Orders  Family Communication: Admission, patients condition and plan of care including tests being ordered have been discussed with the patient and wife who indicate understanding and agree with the plan and Code Status.  Code Status:   FULL CODE, discussed with wife that patient will be admitted for Lasix overdose  Admission status: Observation/: Based on patients clinical presentation and evaluation of above clinical data, I have made determination that patient meets observation criteria at this time.  Time spent in minutes : 70   Jani Gravel M.D on 08/03/2019 at 11:48 PM

## 2019-08-03 NOTE — ED Notes (Addendum)
Spoke with poison control. Poison control requests we call them back when the Mg and CMP result.

## 2019-08-04 DIAGNOSIS — I209 Angina pectoris, unspecified: Secondary | ICD-10-CM | POA: Diagnosis not present

## 2019-08-04 DIAGNOSIS — I2510 Atherosclerotic heart disease of native coronary artery without angina pectoris: Secondary | ICD-10-CM | POA: Diagnosis present

## 2019-08-04 DIAGNOSIS — Z7982 Long term (current) use of aspirin: Secondary | ICD-10-CM | POA: Diagnosis not present

## 2019-08-04 DIAGNOSIS — Z87891 Personal history of nicotine dependence: Secondary | ICD-10-CM | POA: Diagnosis not present

## 2019-08-04 DIAGNOSIS — T501X1A Poisoning by loop [high-ceiling] diuretics, accidental (unintentional), initial encounter: Secondary | ICD-10-CM | POA: Diagnosis present

## 2019-08-04 DIAGNOSIS — I110 Hypertensive heart disease with heart failure: Secondary | ICD-10-CM | POA: Diagnosis present

## 2019-08-04 DIAGNOSIS — I252 Old myocardial infarction: Secondary | ICD-10-CM | POA: Diagnosis not present

## 2019-08-04 DIAGNOSIS — R3981 Functional urinary incontinence: Secondary | ICD-10-CM | POA: Diagnosis not present

## 2019-08-04 DIAGNOSIS — I4821 Permanent atrial fibrillation: Secondary | ICD-10-CM | POA: Diagnosis present

## 2019-08-04 DIAGNOSIS — E785 Hyperlipidemia, unspecified: Secondary | ICD-10-CM | POA: Diagnosis present

## 2019-08-04 DIAGNOSIS — H1013 Acute atopic conjunctivitis, bilateral: Secondary | ICD-10-CM | POA: Diagnosis not present

## 2019-08-04 DIAGNOSIS — I4891 Unspecified atrial fibrillation: Secondary | ICD-10-CM | POA: Diagnosis not present

## 2019-08-04 DIAGNOSIS — Z888 Allergy status to other drugs, medicaments and biological substances status: Secondary | ICD-10-CM | POA: Diagnosis not present

## 2019-08-04 DIAGNOSIS — E538 Deficiency of other specified B group vitamins: Secondary | ICD-10-CM | POA: Diagnosis not present

## 2019-08-04 DIAGNOSIS — T50901D Poisoning by unspecified drugs, medicaments and biological substances, accidental (unintentional), subsequent encounter: Secondary | ICD-10-CM | POA: Diagnosis not present

## 2019-08-04 DIAGNOSIS — T50904A Poisoning by unspecified drugs, medicaments and biological substances, undetermined, initial encounter: Secondary | ICD-10-CM | POA: Diagnosis not present

## 2019-08-04 DIAGNOSIS — T887XXA Unspecified adverse effect of drug or medicament, initial encounter: Secondary | ICD-10-CM | POA: Diagnosis not present

## 2019-08-04 DIAGNOSIS — I10 Essential (primary) hypertension: Secondary | ICD-10-CM | POA: Diagnosis not present

## 2019-08-04 DIAGNOSIS — Z89421 Acquired absence of other right toe(s): Secondary | ICD-10-CM | POA: Diagnosis not present

## 2019-08-04 DIAGNOSIS — J449 Chronic obstructive pulmonary disease, unspecified: Secondary | ICD-10-CM | POA: Diagnosis present

## 2019-08-04 DIAGNOSIS — E11621 Type 2 diabetes mellitus with foot ulcer: Secondary | ICD-10-CM | POA: Diagnosis not present

## 2019-08-04 DIAGNOSIS — L03115 Cellulitis of right lower limb: Secondary | ICD-10-CM | POA: Diagnosis not present

## 2019-08-04 DIAGNOSIS — E1159 Type 2 diabetes mellitus with other circulatory complications: Secondary | ICD-10-CM | POA: Diagnosis not present

## 2019-08-04 DIAGNOSIS — I480 Paroxysmal atrial fibrillation: Secondary | ICD-10-CM | POA: Diagnosis not present

## 2019-08-04 DIAGNOSIS — R52 Pain, unspecified: Secondary | ICD-10-CM | POA: Diagnosis not present

## 2019-08-04 DIAGNOSIS — Z8673 Personal history of transient ischemic attack (TIA), and cerebral infarction without residual deficits: Secondary | ICD-10-CM | POA: Diagnosis not present

## 2019-08-04 DIAGNOSIS — E1149 Type 2 diabetes mellitus with other diabetic neurological complication: Secondary | ICD-10-CM | POA: Diagnosis not present

## 2019-08-04 DIAGNOSIS — E7800 Pure hypercholesterolemia, unspecified: Secondary | ICD-10-CM | POA: Diagnosis present

## 2019-08-04 DIAGNOSIS — T50901A Poisoning by unspecified drugs, medicaments and biological substances, accidental (unintentional), initial encounter: Secondary | ICD-10-CM | POA: Diagnosis not present

## 2019-08-04 DIAGNOSIS — E1142 Type 2 diabetes mellitus with diabetic polyneuropathy: Secondary | ICD-10-CM | POA: Diagnosis not present

## 2019-08-04 DIAGNOSIS — T50902A Poisoning by unspecified drugs, medicaments and biological substances, intentional self-harm, initial encounter: Secondary | ICD-10-CM | POA: Diagnosis not present

## 2019-08-04 DIAGNOSIS — E1140 Type 2 diabetes mellitus with diabetic neuropathy, unspecified: Secondary | ICD-10-CM | POA: Diagnosis present

## 2019-08-04 DIAGNOSIS — E876 Hypokalemia: Secondary | ICD-10-CM | POA: Diagnosis not present

## 2019-08-04 DIAGNOSIS — Z794 Long term (current) use of insulin: Secondary | ICD-10-CM | POA: Diagnosis not present

## 2019-08-04 DIAGNOSIS — E559 Vitamin D deficiency, unspecified: Secondary | ICD-10-CM | POA: Diagnosis not present

## 2019-08-04 DIAGNOSIS — G4733 Obstructive sleep apnea (adult) (pediatric): Secondary | ICD-10-CM | POA: Diagnosis present

## 2019-08-04 DIAGNOSIS — L02415 Cutaneous abscess of right lower limb: Secondary | ICD-10-CM | POA: Diagnosis not present

## 2019-08-04 DIAGNOSIS — H9190 Unspecified hearing loss, unspecified ear: Secondary | ICD-10-CM | POA: Diagnosis present

## 2019-08-04 DIAGNOSIS — D519 Vitamin B12 deficiency anemia, unspecified: Secondary | ICD-10-CM | POA: Diagnosis not present

## 2019-08-04 DIAGNOSIS — Z20828 Contact with and (suspected) exposure to other viral communicable diseases: Secondary | ICD-10-CM | POA: Diagnosis present

## 2019-08-04 DIAGNOSIS — I872 Venous insufficiency (chronic) (peripheral): Secondary | ICD-10-CM | POA: Diagnosis not present

## 2019-08-04 DIAGNOSIS — Z85828 Personal history of other malignant neoplasm of skin: Secondary | ICD-10-CM | POA: Diagnosis not present

## 2019-08-04 DIAGNOSIS — M6281 Muscle weakness (generalized): Secondary | ICD-10-CM | POA: Diagnosis not present

## 2019-08-04 DIAGNOSIS — Z803 Family history of malignant neoplasm of breast: Secondary | ICD-10-CM | POA: Diagnosis not present

## 2019-08-04 DIAGNOSIS — R279 Unspecified lack of coordination: Secondary | ICD-10-CM | POA: Diagnosis not present

## 2019-08-04 DIAGNOSIS — L97529 Non-pressure chronic ulcer of other part of left foot with unspecified severity: Secondary | ICD-10-CM | POA: Diagnosis present

## 2019-08-04 DIAGNOSIS — Z8249 Family history of ischemic heart disease and other diseases of the circulatory system: Secondary | ICD-10-CM | POA: Diagnosis not present

## 2019-08-04 DIAGNOSIS — Z743 Need for continuous supervision: Secondary | ICD-10-CM | POA: Diagnosis not present

## 2019-08-04 DIAGNOSIS — Z7902 Long term (current) use of antithrombotics/antiplatelets: Secondary | ICD-10-CM | POA: Diagnosis not present

## 2019-08-04 DIAGNOSIS — I5032 Chronic diastolic (congestive) heart failure: Secondary | ICD-10-CM | POA: Diagnosis present

## 2019-08-04 LAB — COMPREHENSIVE METABOLIC PANEL
ALT: 10 U/L (ref 0–44)
AST: 21 U/L (ref 15–41)
Albumin: 3.4 g/dL — ABNORMAL LOW (ref 3.5–5.0)
Alkaline Phosphatase: 60 U/L (ref 38–126)
Anion gap: 12 (ref 5–15)
BUN: 22 mg/dL (ref 8–23)
CO2: 29 mmol/L (ref 22–32)
Calcium: 9 mg/dL (ref 8.9–10.3)
Chloride: 100 mmol/L (ref 98–111)
Creatinine, Ser: 1.01 mg/dL (ref 0.61–1.24)
GFR calc Af Amer: >60 mL/min (ref >60)
GFR calc non Af Amer: >60 mL/min (ref >60)
Glucose, Bld: 127 mg/dL — ABNORMAL HIGH (ref 70–99)
Potassium: 3.6 mmol/L (ref 3.5–5.1)
Sodium: 141 mmol/L (ref 135–145)
Total Bilirubin: 1.2 mg/dL (ref 0.3–1.2)
Total Protein: 6.6 g/dL (ref 6.5–8.1)

## 2019-08-04 LAB — BASIC METABOLIC PANEL
Anion gap: 12 (ref 5–15)
Anion gap: 11 (ref 5–15)
BUN: 22 mg/dL (ref 8–23)
BUN: 19 mg/dL (ref 8–23)
CO2: 28 mmol/L (ref 22–32)
CO2: 30 mmol/L (ref 22–32)
Calcium: 9.2 mg/dL (ref 8.9–10.3)
Calcium: 8.9 mg/dL (ref 8.9–10.3)
Chloride: 100 mmol/L (ref 98–111)
Chloride: 100 mmol/L (ref 98–111)
Creatinine, Ser: 1.12 mg/dL (ref 0.61–1.24)
Creatinine, Ser: 1.09 mg/dL (ref 0.61–1.24)
GFR calc Af Amer: >60 mL/min (ref >60)
GFR calc Af Amer: >60 mL/min (ref >60)
GFR calc non Af Amer: >60 mL/min (ref >60)
GFR calc non Af Amer: >60 mL/min (ref >60)
Glucose, Bld: 145 mg/dL — ABNORMAL HIGH (ref 70–99)
Glucose, Bld: 172 mg/dL — ABNORMAL HIGH (ref 70–99)
Potassium: 4 mmol/L (ref 3.5–5.1)
Potassium: 3.7 mmol/L (ref 3.5–5.1)
Sodium: 140 mmol/L (ref 135–145)
Sodium: 141 mmol/L (ref 135–145)

## 2019-08-04 LAB — GLUCOSE, CAPILLARY
Glucose-Capillary: 135 mg/dL — ABNORMAL HIGH (ref 70–99)
Glucose-Capillary: 120 mg/dL — ABNORMAL HIGH (ref 70–99)
Glucose-Capillary: 162 mg/dL — ABNORMAL HIGH (ref 70–99)
Glucose-Capillary: 131 mg/dL — ABNORMAL HIGH (ref 70–99)
Glucose-Capillary: 122 mg/dL — ABNORMAL HIGH (ref 70–99)

## 2019-08-04 LAB — CBC
HCT: 33.6 % — ABNORMAL LOW (ref 39.0–52.0)
Hemoglobin: 10.8 g/dL — ABNORMAL LOW (ref 13.0–17.0)
MCH: 30.1 pg (ref 26.0–34.0)
MCHC: 32.1 g/dL (ref 30.0–36.0)
MCV: 93.6 fL (ref 80.0–100.0)
Platelets: 189 10*3/uL (ref 150–400)
RBC: 3.59 MIL/uL — ABNORMAL LOW (ref 4.22–5.81)
RDW: 16.1 % — ABNORMAL HIGH (ref 11.5–15.5)
WBC: 4.9 10*3/uL (ref 4.0–10.5)
nRBC: 0 % (ref 0.0–0.2)

## 2019-08-04 LAB — MRSA PCR SCREENING: MRSA by PCR: NEGATIVE

## 2019-08-04 LAB — MAGNESIUM: Magnesium: 1.4 mg/dL — ABNORMAL LOW (ref 1.7–2.4)

## 2019-08-04 MED ORDER — PRAVASTATIN SODIUM 40 MG PO TABS
40.0000 mg | ORAL_TABLET | Freq: Every day | ORAL | Status: DC
  Administered 2019-08-05 – 2019-08-09 (×5): 40 mg via ORAL
  Filled 2019-08-04 (×5): qty 1

## 2019-08-04 MED ORDER — SODIUM CHLORIDE 0.9 % IV SOLN
100.0000 mg | Freq: Two times a day (BID) | INTRAVENOUS | Status: DC
  Administered 2019-08-04 – 2019-08-06 (×5): 100 mg via INTRAVENOUS
  Filled 2019-08-04 (×6): qty 100

## 2019-08-04 MED ORDER — PIOGLITAZONE HCL 45 MG PO TABS
45.0000 mg | ORAL_TABLET | Freq: Every day | ORAL | Status: DC
  Administered 2019-08-04 – 2019-08-06 (×3): 45 mg via ORAL
  Filled 2019-08-04 (×3): qty 1

## 2019-08-04 MED ORDER — CLOPIDOGREL BISULFATE 75 MG PO TABS
75.0000 mg | ORAL_TABLET | Freq: Every day | ORAL | Status: DC
  Administered 2019-08-04 – 2019-08-08 (×5): 75 mg via ORAL
  Filled 2019-08-04 (×5): qty 1

## 2019-08-04 MED ORDER — MAGNESIUM OXIDE 400 (241.3 MG) MG PO TABS
400.0000 mg | ORAL_TABLET | Freq: Two times a day (BID) | ORAL | Status: AC
  Administered 2019-08-04 – 2019-08-07 (×6): 400 mg via ORAL
  Filled 2019-08-04 (×6): qty 1

## 2019-08-04 MED ORDER — ISOSORBIDE MONONITRATE ER 60 MG PO TB24
60.0000 mg | ORAL_TABLET | Freq: Every day | ORAL | Status: DC
  Administered 2019-08-04 – 2019-08-09 (×6): 60 mg via ORAL
  Filled 2019-08-04 (×6): qty 1

## 2019-08-04 MED ORDER — MAGNESIUM SULFATE 2 GM/50ML IV SOLN
2.0000 g | Freq: Once | INTRAVENOUS | Status: AC
  Administered 2019-08-04: 2 g via INTRAVENOUS
  Filled 2019-08-04: qty 50

## 2019-08-04 MED ORDER — MOMETASONE FURO-FORMOTEROL FUM 100-5 MCG/ACT IN AERO
2.0000 | INHALATION_SPRAY | Freq: Two times a day (BID) | RESPIRATORY_TRACT | Status: DC
  Administered 2019-08-04 – 2019-08-09 (×11): 2 via RESPIRATORY_TRACT
  Filled 2019-08-04 (×2): qty 8.8

## 2019-08-04 MED ORDER — METOPROLOL TARTRATE 25 MG PO TABS
25.0000 mg | ORAL_TABLET | Freq: Two times a day (BID) | ORAL | Status: DC
  Administered 2019-08-04 – 2019-08-05 (×2): 25 mg via ORAL
  Filled 2019-08-04 (×2): qty 1

## 2019-08-04 MED ORDER — ASPIRIN EC 81 MG PO TBEC
81.0000 mg | DELAYED_RELEASE_TABLET | Freq: Every day | ORAL | Status: DC
  Administered 2019-08-04 – 2019-08-09 (×6): 81 mg via ORAL
  Filled 2019-08-04 (×6): qty 1

## 2019-08-04 MED ORDER — VITAMIN B-12 1000 MCG PO TABS
1000.0000 ug | ORAL_TABLET | Freq: Every day | ORAL | Status: DC
  Administered 2019-08-04 – 2019-08-09 (×6): 1000 ug via ORAL
  Filled 2019-08-04 (×6): qty 1

## 2019-08-04 MED ORDER — RANOLAZINE ER 500 MG PO TB12
500.0000 mg | ORAL_TABLET | Freq: Two times a day (BID) | ORAL | Status: DC
  Administered 2019-08-04 – 2019-08-09 (×10): 500 mg via ORAL
  Filled 2019-08-04 (×11): qty 1

## 2019-08-04 MED ORDER — HYDROMORPHONE HCL 1 MG/ML IJ SOLN
0.5000 mg | Freq: Four times a day (QID) | INTRAMUSCULAR | Status: DC | PRN
  Filled 2019-08-04: qty 0.5

## 2019-08-04 MED ORDER — FESOTERODINE FUMARATE ER 4 MG PO TB24
4.0000 mg | ORAL_TABLET | Freq: Every day | ORAL | Status: DC
  Administered 2019-08-04: 4 mg via ORAL
  Filled 2019-08-04 (×2): qty 1

## 2019-08-04 MED ORDER — VITAMIN D 25 MCG (1000 UNIT) PO TABS
1000.0000 [IU] | ORAL_TABLET | Freq: Every day | ORAL | Status: DC
  Administered 2019-08-04 – 2019-08-09 (×6): 1000 [IU] via ORAL
  Filled 2019-08-04 (×6): qty 1

## 2019-08-04 MED ORDER — ONDANSETRON HCL 4 MG/2ML IJ SOLN: 4.0000 mg | Freq: Four times a day (QID) | INTRAMUSCULAR | Status: DC | PRN

## 2019-08-04 MED ORDER — GABAPENTIN 300 MG PO CAPS
300.0000 mg | ORAL_CAPSULE | Freq: Three times a day (TID) | ORAL | Status: DC
  Administered 2019-08-04 – 2019-08-08 (×13): 300 mg via ORAL
  Filled 2019-08-04 (×13): qty 1

## 2019-08-04 NOTE — Plan of Care (Signed)
  Problem: Education: Goal: Knowledge of General Education information will improve Description: Including pain rating scale, medication(s)/side effects and non-pharmacologic comfort measures Outcome: Progressing   Problem: Clinical Measurements: Goal: Ability to maintain clinical measurements within normal limits will improve Outcome: Progressing Goal: Diagnostic test results will improve Outcome: Progressing   

## 2019-08-04 NOTE — Progress Notes (Signed)
PROGRESS NOTE    Charles [name]  MPN:[id number] DOB: 1940/12/14 DOA: 08/03/2019 PCP: Cassandria Anger, MD   Brief Narrative: As per HPI: 78 y.o. male, w hypertension, hyperlipidemia, DM2,Diabetic neuropathy, w/ history of transmetatarsal amputation of the right foot, and also  B12 deficiency, CAD , chronic diastolic CHF, P afib, OSA on CPAP, Copd presents with c/o overdose on lasix, took 30 pills by mistake per patient.  He reports he took wrong bottle of pills instead of his regular meds and took them all at once. He also notes redness of the right distal lower ext for the past 1 week.    In ED, vitals are stable. Labs with fairly stable sodium-potassium electrolytes, with hypomagnesemia COVID-19 negative.  Pt given magnesium sulfate 2gm iv x1, and also Kcl 60 meq po x1,  Poison control per ED recommended monitoring his renal function as well as potassium over nite.   Subjective: Reports voiding continuously. No chest pain SOB and actually feels lot better in terms of breathing after lasix OD. Arm, leg and abdominal swelling is going down. So far UOP 810.8 litres  Assessment & Plan:   Overdose with Lasix, unintentional:Poison control was consulted on admission.  Patient is having significant urine output, needing condom catheter, and close monitoring of lites.  So far creatinine sodium potassium remained stable.  Will repeat labs later today.Will need to monitor him closely for signs and symptoms and lab findings of overdiuresis, and assess if he needs  IV fluid hydration.  CAD/chronic diastolic CHF/Essential hypertension:Patient reports he had significant swelling and difficulty breathing prior to taking Lasix and now noticed significant improvement in the symptoms.  Lasix on hold, continue rest of his cardiac medication with Ranexa, Lopressor, Imdur, pravastatin, aspirin, Plavix.  DM type 2 causing neurological disease: Stable on sliding scale insulin. Recent Labs  Lab 08/04/19 0245  08/04/19 0758  GLUCAP 135* 120*   PAF: Rate controlled on Lopressor, on aspirin and Plavix.   History of CVA on aspirin, Plavix and pravastatin.  COPD :Continue his bronchodilators inhaler.  Currently doing well.  Cellulitis right distal lower extremity/skin tear on dorsum of the left foot/skin ulcer on the left lateral aspect of the left foot near first met tarsal amputation : Continue IV doxycycline.  Follow-up wound care/wound consult.  Urinary incontinence: Detrol-> Toviaz  Body mass index is 37.46 kg/m.   DVT prophylaxis: Heparin Code Status: full Family Communication: plan of care discussed with patient in detail. Disposition Plan: We will continue to monitor given his significant diuresis, laboratory labs.  Anticipate stay at least 1 more night stay in hospital- change to inpatient. PT OT consulted for ambulation and disposition.   Consultants:  poison control  Procedures: None  Microbiology: None  Antimicrobials: Anti-infectives (From admission, onward)   Start     Dose/Rate Route Frequency Ordered Stop   08/04/19 0300  doxycycline (VIBRAMYCIN) 100 mg in sodium chloride 0.9 % 250 mL IVPB     100 mg 125 mL/hr over 120 Minutes Intravenous Every 12 hours 08/04/19 0223      Objective: Vitals:   08/04/19 0000 08/04/19 0234 08/04/19 0353 08/04/19 0500  BP: 122/60 122/61    Pulse: 91 93 (!) 110   Resp: '12 18 18   ' Temp:  98 F (36.7 C)    TempSrc:  Oral    SpO2: 96% 96% 94%   Weight:    (!) 139.6 kg    Intake/Output Summary (Last 24 hours) at 08/04/2019 1038 Last data filed  at 08/04/2019 9678 Gross per 24 hour  Intake 310.22 ml  Output 8475 ml  Net -8164.78 ml   Filed Weights   08/04/19 0500  Weight: (!) 139.6 kg   Weight change:   Body mass index is 37.46 kg/m.  Intake/Output from previous day: 08/21 0701 - 08/22 0700 In: 310.2 [I.V.:17.3; IV Piggyback:292.9] Out: 8475 [Urine:8475] Intake/Output this shift: No intake/output data recorded.   Examination:  General exam: Appears calm and comfortable, Obese, NAD, on RA. HEENT:PERRL,Oral mucosa moist, Ear/Nose normal on gross exam. Respiratory system: Bilaterally diminished air entry. Cardiovascular system: S1 & S2 heard,No JVD, murmurs. Gastrointestinal system: Abdomen is  soft, non tender, non distended, BS +  Nervous System:Alert and oriented. No focal neurological deficits/moving extremities, sensation intact. Extremities: chronic appearing leg edema w hyperpigmentation, skin scaling, prior toes amputation on LE.No edema, no clubbing, distal peripheral pulses palpable. Skin: No rashes, lesions, no icterus MSK: Normal muscle bulk,tone ,power  Medications:  Scheduled Meds: . aspirin EC  81 mg Oral Daily  . cholecalciferol  1,000 Units Oral Daily  . clopidogrel  75 mg Oral Daily  . fesoterodine  4 mg Oral Daily  . gabapentin  300 mg Oral TID  . heparin  5,000 Units Subcutaneous Q8H  . insulin aspart  0-5 Units Subcutaneous QHS  . insulin aspart  0-9 Units Subcutaneous TID WC  . isosorbide mononitrate  60 mg Oral Daily  . metoprolol tartrate  25 mg Oral BID  . mometasone-formoterol  2 puff Inhalation BID  . pioglitazone  45 mg Oral Daily  . ranolazine  500 mg Oral BID  . sodium chloride flush  3 mL Intravenous Q12H  . vitamin B-12  1,000 mcg Oral Daily   Continuous Infusions: . sodium chloride    . doxycycline (VIBRAMYCIN) IV 100 mg (08/04/19 0351)    Data Reviewed: I have personally reviewed following labs and imaging studies  CBC: Recent Labs  Lab 08/03/19 1647 08/04/19 0623  WBC 5.3 4.9  NEUTROABS 4.3  --   HGB 11.2* 10.8*  HCT 36.3* 33.6*  MCV 96.0 93.6  PLT 193 938   Basic Metabolic Panel: Recent Labs  Lab 08/03/19 1647 08/03/19 1945 08/03/19 2118 08/04/19 0119 08/04/19 0623  NA 140 140  --  140 141  K 5.3* 3.9  --  4.0 3.6  CL 101 100  --  100 100  CO2 27 28  --  28 29  GLUCOSE 200* 179*  --  145* 127*  BUN 26* 24*  --  22 22  CREATININE  1.30* 1.16  --  1.12 1.01  CALCIUM 9.5 9.2  --  9.2 9.0  MG 1.4*  --  1.9  --   --    GFR: Estimated Creatinine Clearance: 93.5 mL/min (by C-G formula based on SCr of 1.01 mg/dL). Liver Function Tests: Recent Labs  Lab 08/03/19 1647 08/04/19 0623  AST 22 21  ALT 12 10  ALKPHOS 60 60  BILITOT 1.1 1.2  PROT 6.8 6.6  ALBUMIN 3.5 3.4*   No results for input(s): LIPASE, AMYLASE in the last 168 hours. No results for input(s): AMMONIA in the last 168 hours. Coagulation Profile: No results for input(s): INR, PROTIME in the last 168 hours. Cardiac Enzymes: No results for input(s): CKTOTAL, CKMB, CKMBINDEX, TROPONINI in the last 168 hours. BNP (last 3 results) No results for input(s): PROBNP in the last 8760 hours. HbA1C: No results for input(s): HGBA1C in the last 72 hours. CBG: Recent Labs  Lab  08/04/19 0245 08/04/19 0758  GLUCAP 135* 120*   Lipid Profile: No results for input(s): CHOL, HDL, LDLCALC, TRIG, CHOLHDL, LDLDIRECT in the last 72 hours. Thyroid Function Tests: No results for input(s): TSH, T4TOTAL, FREET4, T3FREE, THYROIDAB in the last 72 hours. Anemia Panel: No results for input(s): VITAMINB12, FOLATE, FERRITIN, TIBC, IRON, RETICCTPCT in the last 72 hours. Sepsis Labs: No results for input(s): PROCALCITON, LATICACIDVEN in the last 168 hours.  Recent Results (from the past 240 hour(s))  SARS CORONAVIRUS 2 Nasal Swab Aptima Multi Swab     Status: None   Collection Time: 08/03/19  4:47 PM   Specimen: Aptima Multi Swab; Nasal Swab  Result Value Ref Range Status   SARS Coronavirus 2 NEGATIVE NEGATIVE Final    Comment: (NOTE) SARS-CoV-2 target nucleic acids are NOT DETECTED. The SARS-CoV-2 RNA is generally detectable in upper and lower respiratory specimens during the acute phase of infection. Negative results do not preclude SARS-CoV-2 infection, do not rule out co-infections with other pathogens, and should not be used as the sole basis for treatment or other  patient management decisions. Negative results must be combined with clinical observations, patient history, and epidemiological information. The expected result is Negative. Fact Sheet for Patients: SugarRoll.be Fact Sheet for Healthcare Providers: https://www.woods-mathews.com/ This test is not yet approved or cleared by the [place] FDA and  has been authorized for detection and/or diagnosis of SARS-CoV-2 by FDA under an Emergency Use Authorization (EUA). This EUA will remain  in effect (meaning this test can be used) for the duration of the COVID-19 declaration under Section 56 4(b)(1) of the Act, 21 U.S.C. section 360bbb-3(b)(1), unless the authorization is terminated or revoked sooner. Performed at [facility], [place] [address] [place], [place] [address]   MRSA PCR Screening     Status: None   Collection Time: 08/04/19  2:50 AM   Specimen: Nasal Mucosa; Nasopharyngeal  Result Value Ref Range Status   MRSA by PCR NEGATIVE NEGATIVE Final    Comment:        The GeneXpert MRSA Assay (FDA approved for NASAL specimens only), is one component of a comprehensive MRSA colonization surveillance program. It is not intended to diagnose MRSA infection nor to guide or monitor treatment for MRSA infections. Performed at [facility], [place] [address], [place], [place] [address]       Radiology Studies: No results found.    LOS: 0 days   Time spent: More than 50% of that time was spent in counseling and/or coordination of care.  Antonieta Pert, MD [facility]  08/04/2019, 10:38 AM

## 2019-08-04 NOTE — ED Notes (Signed)
Report given to 5W RN. All questions answered 

## 2019-08-04 NOTE — Evaluation (Signed)
Physical Therapy Evaluation Patient Details Name: Charles [name] MRN: [id number] DOB: Dec 31, 1940 Today's Date: 08/04/2019   History of Present Illness  78 yo male with onset of lasix overdose of 30 pills accidentally was admitted to manage his electrolytes and fluid.  PMHx:  R transmet amputation with cellulitis, DM, PN, B12 defic, CAD, CHF, PAF, OSA, COPD, CVA, urinary incontinence  Clinical Impression  Pt was seen for mobility using RW initially but could not stand, although mod assist lateral scooting was possible.  Used bed pad for support and to protect his wrists from too much pressure.  Follow up acutely for moving to chair, standing as tolerated and for control of sidesteps to more long distance gait as he is able.  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    Follow Up Recommendations SNF    Equipment Recommendations  None recommended by PT    Recommendations for Other Services       Precautions / Restrictions Precautions Precautions: Fall Precaution Comments: monitor for vitals Required Braces or Orthoses: Other Brace Other Brace: R foot ortho shoe (not at hosp) Restrictions Weight Bearing Restrictions: No      Mobility  Bed Mobility Overal bed mobility: Needs Assistance Bed Mobility: Supine to Sit;Sit to Supine     Supine to sit: Mod assist Sit to supine: Mod assist   General bed mobility comments: pt is awkward with his request for PT to pull on R arm to get back, and talked with him about this risking R shoulder  Transfers Overall transfer level: Needs assistance Equipment used: Rolling walker (2 wheeled);1 person hand held assist Transfers: Sit to/from Stand;Lateral/Scoot Transfers Sit to Stand: Max assist;From elevated surface(with only partial clearance of hips from bed)        Lateral/Scoot Transfers: Mod assist;From elevated surface General transfer comment: pt requires significant help to manage RW, very poor control of  sit to stand partially from R foot pain and lack of ortho shoe  Ambulation/Gait             General Gait Details: unable  Stairs            Wheelchair Mobility    Modified Rankin (Stroke Patients Only) Modified Rankin (Stroke Patients Only) Pre-Morbid Rankin Score: Slight disability Modified Rankin: Severe disability     Balance Overall balance assessment: Needs assistance Sitting-balance support: Feet supported;Bilateral upper extremity supported Sitting balance-Leahy Scale: Fair Sitting balance - Comments: pt is using UE's to support on bed, using IV arm and L humeral injury arm with care to control sit Postural control: Posterior lean     Standing balance comment: unable to tolerate                             Pertinent Vitals/Pain Pain Assessment: Faces Faces Pain Scale: Hurts whole lot Pain Location: B feet with standing attempts, when on the floor and LUE with any movement Pain Descriptors / Indicators: Operative site guarding;Tender Pain Intervention(s): Limited activity within patient's tolerance;Monitored during session;Premedicated before session;Repositioned;Patient requesting pain meds-RN notified    Home Living Family/patient expects to be discharged to:: Private residence Living Arrangements: Spouse/significant other;Children Available Help at Discharge: Family;Available 24 hours/day Type of Home: House Home Access: Ramped entrance     Home Layout: One level Home Equipment: Walker - 2 wheels;Walker - 4 wheels;Cane - single point;Wheelchair - manual Additional Comments: has equipment but talks about physically assisting  his wife    Prior Function Level of Independence: Independent with assistive device(s);Needs assistance   Gait / Transfers Assistance Needed: RW for short trips and uses WC outdoors  ADL's / Homemaking Assistance Needed: has son and his wife to assist with house and cooking        Hand Dominance   Dominant  Hand: Right    Extremity/Trunk Assessment   Upper Extremity Assessment Upper Extremity Assessment: LUE deficits/detail LUE Deficits / Details: had a humeral fracture to LUE in Feb 2020 LUE: Unable to fully assess due to pain;Unable to fully assess due to immobilization LUE Coordination: decreased fine motor;decreased gross motor    Lower Extremity Assessment Lower Extremity Assessment: Generalized weakness    Cervical / Trunk Assessment Cervical / Trunk Assessment: Normal  Communication   Communication: HOH  Cognition Arousal/Alertness: Awake/alert;Lethargic Behavior During Therapy: WFL for tasks assessed/performed Overall Cognitive Status: Within Functional Limits for tasks assessed                                 General Comments: WFL during standing attempts and with bed mob      General Comments General comments (skin integrity, edema, etc.): Pt is up to side of bed, his tolerance for sitting is fair and standing not able    Exercises Other Exercises Other Exercises: s   Assessment/Plan    PT Assessment Patient needs continued PT services  PT Problem List Decreased strength;Decreased range of motion;Decreased balance;Decreased activity tolerance;Decreased mobility;Decreased coordination;Decreased knowledge of use of DME;Decreased safety awareness;Cardiopulmonary status limiting activity;Obesity;Decreased skin integrity;Pain       PT Treatment Interventions DME instruction;Gait training;Stair training;Functional mobility training;Therapeutic activities;Therapeutic exercise;Balance training;Neuromuscular re-education;Cognitive remediation;Patient/family education    PT Goals (Current goals can be found in the Care Plan section)  Acute Rehab PT Goals Patient Stated Goal: to walk and get home with family PT Goal Formulation: With patient Time For Goal Achievement: 08/18/19 Potential to Achieve Goals: Fair    Frequency Min 2X/week   Barriers to  discharge Inaccessible home environment;Decreased caregiver support has potentially not 2 person assist at all times    Co-evaluation               AM-PAC PT "6 Clicks" Mobility  Outcome Measure Help needed turning from your back to your side while in a flat bed without using bedrails?: A Lot Help needed moving from lying on your back to sitting on the side of a flat bed without using bedrails?: A Lot Help needed moving to and from a bed to a chair (including a wheelchair)?: A Lot Help needed standing up from a chair using your arms (e.g., wheelchair or bedside chair)?: Total Help needed to walk in hospital room?: Total Help needed climbing 3-5 steps with a railing? : Total 6 Click Score: 9    End of Session Equipment Utilized During Treatment: Gait belt Activity Tolerance: Patient tolerated treatment well;Patient limited by fatigue;Treatment limited secondary to medical complications (Comment) Patient left: in bed;with call bell/phone within reach;with bed alarm set Nurse Communication: Mobility status PT Visit Diagnosis: Other abnormalities of gait and mobility (R26.89);Muscle weakness (generalized) (M62.81);Difficulty in walking, not elsewhere classified (R26.2);Adult, failure to thrive (R62.7)    Time: [id number] PT Time Calculation (min) (ACUTE ONLY): 42 min   Charges:   PT Evaluation $PT Eval Moderate Complexity: 1 Mod PT Treatments $Therapeutic Exercise: 8-22 mins $Therapeutic Activity: 8-22 mins  Ramond Dial 08/04/2019, 4:46 PM   Mee Hives, PT MS Acute Rehab Dept. Number: [place] [facility] [place]

## 2019-08-04 NOTE — ED Notes (Signed)
Gave update to poison control

## 2019-08-04 NOTE — Progress Notes (Signed)
Patient arrived to the unit via bed.  Patient is alert and oriented x4/  Skin assessment complete.  Skin tears noted to the left elbow and right forearm.  Cellulitis appears to be to the abdomen and legs bilaterally. MASD to the groin area bilaterally.  Lower extremities dry, flaky. Wound noted to anterior eft foot . Condom Catheter placed on patient for accurate output.  Placed the patient on telemetry and continuous pulse ox. Educated the patient on how to reach the staff on the unit.  Lowered the bed. Activated the bed alarm and placed the call light within reach.  Will continue to monitor the patient and notify as needed

## 2019-08-05 LAB — GLUCOSE, CAPILLARY
Glucose-Capillary: 114 mg/dL — ABNORMAL HIGH (ref 70–99)
Glucose-Capillary: 133 mg/dL — ABNORMAL HIGH (ref 70–99)
Glucose-Capillary: 157 mg/dL — ABNORMAL HIGH (ref 70–99)
Glucose-Capillary: 114 mg/dL — ABNORMAL HIGH (ref 70–99)

## 2019-08-05 LAB — BASIC METABOLIC PANEL
Anion gap: 12 (ref 5–15)
BUN: 19 mg/dL (ref 8–23)
CO2: 29 mmol/L (ref 22–32)
Calcium: 8.8 mg/dL — ABNORMAL LOW (ref 8.9–10.3)
Chloride: 98 mmol/L (ref 98–111)
Creatinine, Ser: 1.09 mg/dL (ref 0.61–1.24)
GFR calc Af Amer: >60 mL/min (ref >60)
GFR calc non Af Amer: >60 mL/min (ref >60)
Glucose, Bld: 115 mg/dL — ABNORMAL HIGH (ref 70–99)
Potassium: 3.2 mmol/L — ABNORMAL LOW (ref 3.5–5.1)
Sodium: 139 mmol/L (ref 135–145)

## 2019-08-05 MED ORDER — SODIUM CHLORIDE 0.9 % IV SOLN
INTRAVENOUS | Status: AC
  Administered 2019-08-05: 14:00:00 via INTRAVENOUS

## 2019-08-05 MED ORDER — POTASSIUM CHLORIDE CRYS ER 20 MEQ PO TBCR
40.0000 meq | EXTENDED_RELEASE_TABLET | ORAL | Status: AC
  Administered 2019-08-05 (×2): 40 meq via ORAL
  Filled 2019-08-05 (×2): qty 2

## 2019-08-05 NOTE — Evaluation (Addendum)
Occupational Therapy Evaluation Patient Details Name: Charles [name] MRN: [id number] DOB: 1940-12-20 Today's Date: 08/05/2019    History of Present Illness 78 yo male with onset of lasix overdose of 30 pills accidentally was admitted to manage his electrolytes and fluid.  PMHx:  R transmet amputation with cellulitis, DM, PN, B12 defic, CAD, CHF, PAF, OSA, COPD, CVA, urinary incontinence   Clinical Impression   PTA patient used RW for mobility, spouse assisted as needed with LB ADLs, IADLs. Admitted for above and limited by problem list below, including generalized weakness, impaired balance and decreased activity tolerance.  Cognitively, spouse reports aphasia at baseline but otherwise baseline; he is [facility] but able to follow 1 step commands given increased time, difficulty problem solving with poor awareness to deficits (thinking about future rather than now-- ie talking about going to the gym to get stronger).  He requires mod-max assist +2 for bed mobility, max assist +2 for transfers using RW from elevated bed, min-mod assist for UB ADLs and max-total assist for LB ADLs.  He will benefit from continued OT services while admitted and after dc at SNF Level in order to [facility] strength/endurance for mobility and ADLs to decrease burden of care prior to dc home.     Follow Up Recommendations  SNF;Supervision/Assistance - 24 hour    Equipment Recommendations  3 in 1 bedside commode    Recommendations for Other Services PT consult     Precautions / Restrictions Precautions Precautions: Fall Required Braces or Orthoses: Other Brace Other Brace: R foot ortho shoe (not at hosp) Restrictions Weight Bearing Restrictions: No      Mobility Bed Mobility Overal bed mobility: Needs Assistance Bed Mobility: Supine to Sit;Sit to Supine     Supine to sit: Mod assist;HOB elevated Sit to supine: Max assist;+2 for safety/equipment;+2 for physical assistance   General bed mobility comments: patient  required mod assist to transition trunk to EOB, returned to supine with support of BLEs and trunk   Transfers Overall transfer level: Needs assistance Equipment used: Rolling walker (2 wheeled);1 person hand held assist Transfers: Sit to/from Stand Sit to Stand: Max assist;+2 physical assistance;From elevated surface         General transfer comment: physical assist to power up, for safety and balance from elevated EOB once standing able to side step to [facility] with min assist +2 for safety     Balance Overall balance assessment: Needs assistance Sitting-balance support: No upper extremity supported;Feet supported Sitting balance-Leahy Scale: Fair     Standing balance support: Bilateral upper extremity supported;During functional activity Standing balance-Leahy Scale: Poor Standing balance comment: relaint on BUE and external support                           ADL either performed or assessed with clinical judgement   ADL Overall ADL's : Needs assistance/impaired     Grooming: Minimal assistance;Sitting   Upper Body Bathing: Minimal assistance;Sitting   Lower Body Bathing: Maximal assistance;+2 for physical assistance;+2 for safety/equipment;Sit to/from stand   Upper Body Dressing : Moderate assistance;Sitting   Lower Body Dressing: Total assistance;+2 for physical assistance;+2 for safety/equipment;Sit to/from Health and safety inspector Details (indicate cue type and reason): deferred due to safety and hypotension         Functional mobility during ADLs: Maximal assistance;+2 for physical assistance;Rolling walker;Cueing for sequencing;Cueing for safety General ADL Comments: pt limited by weakness, pain in B LEs and decreased activity  tolerance     Vision   Vision Assessment?: No apparent visual deficits     Perception     Praxis      Pertinent Vitals/Pain Pain Assessment: Faces Faces Pain Scale: Hurts little more Pain Location: B LEs/feet  Pain  Descriptors / Indicators: Discomfort;Grimacing;Tender(nerve pain ) Pain Intervention(s): Limited activity within patient's tolerance;Monitored during session;Repositioned     Hand Dominance Right   Extremity/Trunk Assessment Upper Extremity Assessment Upper Extremity Assessment: LUE deficits/detail;Generalized weakness LUE Deficits / Details: humerus fx in Feb 2020, FF/abduction to 60* LUE Coordination: decreased gross motor   Lower Extremity Assessment Lower Extremity Assessment: Defer to PT evaluation       Communication Communication Communication: HOH;Expressive difficulties   Cognition Arousal/Alertness: Awake/alert Behavior During Therapy: WFL for tasks assessed/performed Overall Cognitive Status: History of cognitive impairments - at baseline                                 General Comments: spouse reports some aphasia at baseline, but cognitively pt at baseline    General Comments  BP monitored- see flowsheets for details     Exercises     Shoulder Instructions      Home Living Family/patient expects to be discharged to:: Private residence Living Arrangements: Spouse/significant other;Children Available Help at Discharge: Family;Available 24 hours/day Type of Home: House Home Access: Ramped entrance     Home Layout: One level     Bathroom Shower/Tub: Teacher, early years/pre: Standard     Home Equipment: Environmental consultant - 2 wheels;Walker - 4 wheels;Cane - single point;Wheelchair - manual          Prior Functioning/Environment Level of Independence: Needs assistance  Gait / Transfers Assistance Needed: RW for short trips and uses WC outdoors ADL's / Homemaking Assistance Needed: spouse assists with LB ADLs, basin bathing only             OT Problem List: Decreased strength;Decreased activity tolerance;Impaired balance (sitting and/or standing);Decreased range of motion;Decreased coordination;Decreased safety awareness;Decreased  knowledge of use of DME or AE;Decreased knowledge of precautions;Cardiopulmonary status limiting activity;Pain;Obesity;Impaired UE functional use      OT Treatment/Interventions: Self-care/ADL training;DME and/or AE instruction;Therapeutic exercise;Therapeutic activities;Patient/family education;Balance training    OT Goals(Current goals can be found in the care plan section) Acute Rehab OT Goals Patient Stated Goal: to go to rehab and get stronger  OT Goal Formulation: With patient/family Time For Goal Achievement: 08/19/19 Potential to Achieve Goals: Good  OT Frequency: Min 2X/week   Barriers to D/C:            Co-evaluation              AM-PAC OT "6 Clicks" Daily Activity     Outcome Measure Help from another person eating meals?: A Little Help from another person taking care of personal grooming?: A Little Help from another person toileting, which includes using toliet, bedpan, or urinal?: A Lot Help from another person bathing (including washing, rinsing, drying)?: A Lot Help from another person to put on and taking off regular upper body clothing?: A Lot Help from another person to put on and taking off regular lower body clothing?: Total 6 Click Score: 13   End of Session Equipment Utilized During Treatment: Gait belt;Rolling walker Nurse Communication: Mobility status;Precautions  Activity Tolerance: Patient tolerated treatment well Patient left: in bed;with call bell/phone within reach;with bed alarm set;Other (comment);with family/visitor present(in chair position in  bed)  OT Visit Diagnosis: Other abnormalities of gait and mobility (R26.89);Pain;Muscle weakness (generalized) (M62.81) Pain - Right/Left: (bil) Pain - part of body: Ankle and joints of foot                Time: [id number] OT Time Calculation (min): 41 min Charges:  OT General Charges $OT Visit: 1 Visit OT Evaluation $OT Eval Moderate Complexity: 1 Mod OT Treatments $Self Care/Home Management :  23-37 mins  Delight Stare, OT Acute Rehabilitation Services Pager [phone] Office [phone]   Delight Stare 08/05/2019, 2:34 PM

## 2019-08-05 NOTE — Progress Notes (Signed)
RT placed pt on CPAP dream station for the night on his home setting of 10 cmH2O w/2Lpm bled into the system. Pt respiratory status is stable on CPAP at this time. RT will continue to monitor.

## 2019-08-05 NOTE — Progress Notes (Signed)
PROGRESS NOTE    Charles [name]  [name] DOB: Aug 17, 1941 DOA: 08/03/2019 PCP: Cassandria Anger, MD   Brief Narrative: As per HPI: 78 y.o. male, w hypertension, hyperlipidemia, DM2,Diabetic neuropathy, w/ history of transmetatarsal amputation of the right foot, and also  B12 deficiency, CAD , chronic diastolic CHF, P afib, OSA on CPAP, Copd presents with c/o overdose on lasix, took 30 pills by mistake per patient.  He reports he took wrong bottle of pills instead of his regular meds and took them all at once. He also notes redness of the right distal lower ext for the past 1 week.    In ED, vitals are stable. Labs with fairly stable sodium-potassium electrolytes, with hypomagnesemia COVID-19 negative.  Pt given magnesium sulfate 2gm iv x1, and also Kcl 60 meq po x1,  Poison control per ED recommended monitoring his renal function as well as potassium over nite.   Subjective: Improved.  Continues to void.  Output inaccurately charted at patient external catheter got dislodged and had swelling of the bed. Orthostatics changed and patient blood pressure understanding want to 70s from 90s.  So placed on IV fluids. PT has a suggested skilled nursing facility patient and wife are in agreement.  Assessment & Plan:   Overdose with Lasix, unintentional:Poison control was consulted on admission.  Patient is having significant urine output.  Also had hypokalemia hypomagnesemia.  He became orthostatic on blood pressure lying 97/56 and standing 74/56-from overdiuresis.  Will start on IV fluid hydration.  Continue monitor electrolytes, orthostatic vitals in the morning for ongoing fluid needs.  PT OT recommends skilled nursing facility and patient and wife in agreement.  CAD/chronic diastolic CHF/Essential hypertension:Patient reports he had significant swelling and difficulty breathing prior to taking Lasix and now noticed significant improvement in the symptoms.  Lasix on hold, continue rest of  his cardiac medication with Ranexa, Lopressor-we will hold due to hypotension. Imdur, pravastatin, aspirin, Plavix.  DM type 2 causing neurological disease: Stable, cont ssi. Recent Labs  Lab 08/04/19 1211 08/04/19 1645 08/04/19 2149 08/05/19 0827 08/05/19 1159  GLUCAP 162* 131* 122* 114* 133*   PAF: Rate controlled on aspirin and Plavix. Hold lopressor for hypotension   History of CVA on aspirin, Plavix and pravastatin.  COPD :Continue his bronchodilators inhaler.  Currently doing well.  Cellulitis right distal lower extremity/skin tear on dorsum of the left foot/skin ulcer on the left lateral aspect of the left foot near first met tarsal amputation : Continue IV doxycycline.  Follow-up wound care/wound.  Urinary incontinence: Detrol-> Lisbeth Ply. Does not want this med. OSA- on cpap  Body mass index is 37.46 kg/m.   DVT prophylaxis: Heparin Code Status: full Family Communication: plan of care discussed with patient in detail. Disposition Plan: SNF per pt/ot recs- wife and pt in agreement- he is hard of hearing. They prefer [facility].   Consultants:  poison control  Procedures: None  Microbiology: None  Antimicrobials: Anti-infectives (From admission, onward)   Start     Dose/Rate Route Frequency Ordered Stop   08/04/19 0300  doxycycline (VIBRAMYCIN) 100 mg in sodium chloride 0.9 % 250 mL IVPB     100 mg 125 mL/hr over 120 Minutes Intravenous Every 12 hours 08/04/19 0223      Objective: Vitals:   08/05/19 0022 08/05/19 0614 08/05/19 0750 08/05/19 0811  BP: 122/60 112/62  110/66  Pulse: (!) 112 87  85  Resp:      Temp: (!) 97.5 F (36.4 C) 98 F (36.7  C)    TempSrc: Axillary Axillary    SpO2: 92% 94% 100%   Weight:        Intake/Output Summary (Last 24 hours) at 08/05/2019 1336 Last data filed at 08/05/2019 0650 Gross per 24 hour  Intake -  Output 1350 ml  Net -1350 ml   Filed Weights   08/04/19 0500  Weight: (!) 139.6 kg   Weight change:   Body  mass index is 37.46 kg/m.  Intake/Output from previous day: 08/22 0701 - 08/23 0700 In: -  Out: 3750 [Urine:3750] Intake/Output this shift: No intake/output data recorded.  Examination:  General exam: Appears calm and comfortable, obese, hard of hearing, not in distress HEENT:PERRL,Oral mucosa moist, Ear/Nose normal on gross exam. Respiratory system: Bilaterally diminished air entry. Cardiovascular system: S1 & S2 heard,No JVD, murmurs. Gastrointestinal system: Abdomen is  soft, non tender, non distended, BS +  Nervous System:Alert and oriented. No focal neurological deficits/moving extremities, sensation intact. Extremities: chronic appearing leg edema w hyperpigmentation- skin crustation/scaling and swellign significantly better in legs. prior toes amputation on LE. Skin: No rashes, lesions, no icterus MSK: Normal muscle bulk,tone ,power  Medications:  Scheduled Meds: . aspirin EC  81 mg Oral Daily  . cholecalciferol  1,000 Units Oral Daily  . clopidogrel  75 mg Oral Daily  . gabapentin  300 mg Oral TID  . heparin  5,000 Units Subcutaneous Q8H  . insulin aspart  0-5 Units Subcutaneous QHS  . insulin aspart  0-9 Units Subcutaneous TID WC  . isosorbide mononitrate  60 mg Oral Daily  . magnesium oxide  400 mg Oral BID  . metoprolol tartrate  25 mg Oral BID  . mometasone-formoterol  2 puff Inhalation BID  . pioglitazone  45 mg Oral Daily  . pravastatin  40 mg Oral Daily  . ranolazine  500 mg Oral BID  . sodium chloride flush  3 mL Intravenous Q12H  . vitamin B-12  1,000 mcg Oral Daily   Continuous Infusions: . sodium chloride    . sodium chloride    . doxycycline (VIBRAMYCIN) IV 100 mg (08/05/19 0300)    Data Reviewed: I have personally reviewed following labs and imaging studies  CBC: Recent Labs  Lab 08/03/19 1647 08/04/19 0623  WBC 5.3 4.9  NEUTROABS 4.3  --   HGB 11.2* 10.8*  HCT 36.3* 33.6*  MCV 96.0 93.6  PLT 193 660   Basic Metabolic Panel: Recent  Labs  Lab 08/03/19 1647 08/03/19 1945 08/03/19 2118 08/04/19 0119 08/04/19 0623 08/04/19 1437 08/05/19 0342  NA 140 140  --  140 141 141 139  K 5.3* 3.9  --  4.0 3.6 3.7 3.2*  CL 101 100  --  100 100 100 98  CO2 27 28  --  '28 29 30 29  ' GLUCOSE 200* 179*  --  145* 127* 172* 115*  BUN 26* 24*  --  '22 22 19 19  ' CREATININE 1.30* 1.16  --  1.12 1.01 1.09 1.09  CALCIUM 9.5 9.2  --  9.2 9.0 8.9 8.8*  MG 1.4*  --  1.9  --   --  1.4*  --    GFR: Estimated Creatinine Clearance: 86.6 mL/min (by C-G formula based on SCr of 1.09 mg/dL). Liver Function Tests: Recent Labs  Lab 08/03/19 1647 08/04/19 0623  AST 22 21  ALT 12 10  ALKPHOS 60 60  BILITOT 1.1 1.2  PROT 6.8 6.6  ALBUMIN 3.5 3.4*   No results for input(s): LIPASE, AMYLASE  in the last 168 hours. No results for input(s): AMMONIA in the last 168 hours. Coagulation Profile: No results for input(s): INR, PROTIME in the last 168 hours. Cardiac Enzymes: No results for input(s): CKTOTAL, CKMB, CKMBINDEX, TROPONINI in the last 168 hours. BNP (last 3 results) No results for input(s): PROBNP in the last 8760 hours. HbA1C: No results for input(s): HGBA1C in the last 72 hours. CBG: Recent Labs  Lab 08/04/19 1211 08/04/19 1645 08/04/19 2149 08/05/19 0827 08/05/19 1159  GLUCAP 162* 131* 122* 114* 133*   Lipid Profile: No results for input(s): CHOL, HDL, LDLCALC, TRIG, CHOLHDL, LDLDIRECT in the last 72 hours. Thyroid Function Tests: No results for input(s): TSH, T4TOTAL, FREET4, T3FREE, THYROIDAB in the last 72 hours. Anemia Panel: No results for input(s): VITAMINB12, FOLATE, FERRITIN, TIBC, IRON, RETICCTPCT in the last 72 hours. Sepsis Labs: No results for input(s): PROCALCITON, LATICACIDVEN in the last 168 hours.  Recent Results (from the past 240 hour(s))  SARS CORONAVIRUS 2 Nasal Swab Aptima Multi Swab     Status: None   Collection Time: 08/03/19  4:47 PM   Specimen: Aptima Multi Swab; Nasal Swab  Result Value Ref  Range Status   SARS Coronavirus 2 NEGATIVE NEGATIVE Final    Comment: (NOTE) SARS-CoV-2 target nucleic acids are NOT DETECTED. The SARS-CoV-2 RNA is generally detectable in upper and lower respiratory specimens during the acute phase of infection. Negative results do not preclude SARS-CoV-2 infection, do not rule out co-infections with other pathogens, and should not be used as the sole basis for treatment or other patient management decisions. Negative results must be combined with clinical observations, patient history, and epidemiological information. The expected result is Negative. Fact Sheet for Patients: SugarRoll.be Fact Sheet for Healthcare Providers: https://www.woods-mathews.com/ This test is not yet approved or cleared by the [place] FDA and  has been authorized for detection and/or diagnosis of SARS-CoV-2 by FDA under an Emergency Use Authorization (EUA). This EUA will remain  in effect (meaning this test can be used) for the duration of the COVID-19 declaration under Section 56 4(b)(1) of the Act, 21 U.S.C. section 360bbb-3(b)(1), unless the authorization is terminated or revoked sooner. Performed at [facility], [place] [address] [place], [place] [address]   MRSA PCR Screening     Status: None   Collection Time: 08/04/19  2:50 AM   Specimen: Nasal Mucosa; Nasopharyngeal  Result Value Ref Range Status   MRSA by PCR NEGATIVE NEGATIVE Final    Comment:        The GeneXpert MRSA Assay (FDA approved for NASAL specimens only), is one component of a comprehensive MRSA colonization surveillance program. It is not intended to diagnose MRSA infection nor to guide or monitor treatment for MRSA infections. Performed at [facility], [place] [address] [place], [place] [address]       Radiology Studies: No results found.    LOS: 1 day   Time spent: More than 50% of that time was spent in counseling and/or  coordination of care.  Antonieta Pert, MD [facility]  08/05/2019, 1:36 PM

## 2019-08-05 NOTE — Progress Notes (Addendum)
   08/05/19 1325 08/05/19 1326 08/05/19 1329  Orthostatic Lying   BP- Lying 97/56  --   --   Pulse- Lying 109  --   --   Orthostatic Sitting  BP- Sitting  --  (!) 80/60  --   Pulse- Sitting  --  109  --   Orthostatic Standing at 0 minutes  BP- Standing at 0 minutes  --   --  (!) 74/56  Pulse- Standing at 0 minutes  --   --  119    Dr. Antonieta Pert notified about orthostatic vital signs.   Hiram Comber, RN 08/05/2019 1:33 PM

## 2019-08-05 NOTE — Progress Notes (Signed)
Patient refusing fesoterodine this morning; per patient's wife, this is the medication that "started everything," starting with dry mouth, then inability to eat and it also caused severe swelling. Per patient's wife, the side effects are not worth it. Will continue to monitor.

## 2019-08-06 DIAGNOSIS — E876 Hypokalemia: Secondary | ICD-10-CM

## 2019-08-06 DIAGNOSIS — I4821 Permanent atrial fibrillation: Secondary | ICD-10-CM

## 2019-08-06 LAB — BASIC METABOLIC PANEL
Anion gap: 8 (ref 5–15)
BUN: 17 mg/dL (ref 8–23)
CO2: 30 mmol/L (ref 22–32)
Calcium: 8.7 mg/dL — ABNORMAL LOW (ref 8.9–10.3)
Chloride: 102 mmol/L (ref 98–111)
Creatinine, Ser: 1.2 mg/dL (ref 0.61–1.24)
GFR calc Af Amer: >60 mL/min (ref >60)
GFR calc non Af Amer: 58 mL/min — ABNORMAL LOW (ref >60)
Glucose, Bld: 156 mg/dL — ABNORMAL HIGH (ref 70–99)
Potassium: 3.6 mmol/L (ref 3.5–5.1)
Sodium: 140 mmol/L (ref 135–145)

## 2019-08-06 LAB — GLUCOSE, CAPILLARY
Glucose-Capillary: 127 mg/dL — ABNORMAL HIGH (ref 70–99)
Glucose-Capillary: 138 mg/dL — ABNORMAL HIGH (ref 70–99)
Glucose-Capillary: 136 mg/dL — ABNORMAL HIGH (ref 70–99)
Glucose-Capillary: 155 mg/dL — ABNORMAL HIGH (ref 70–99)

## 2019-08-06 LAB — MAGNESIUM: Magnesium: 1.5 mg/dL — ABNORMAL LOW (ref 1.7–2.4)

## 2019-08-06 MED ORDER — LINAGLIPTIN 5 MG PO TABS
5.0000 mg | ORAL_TABLET | Freq: Every day | ORAL | Status: DC
  Administered 2019-08-06 – 2019-08-09 (×4): 5 mg via ORAL
  Filled 2019-08-06 (×4): qty 1

## 2019-08-06 MED ORDER — POTASSIUM CHLORIDE CRYS ER 20 MEQ PO TBCR
40.0000 meq | EXTENDED_RELEASE_TABLET | Freq: Once | ORAL | Status: AC
  Administered 2019-08-06: 40 meq via ORAL
  Filled 2019-08-06: qty 2

## 2019-08-06 MED ORDER — DOXYCYCLINE HYCLATE 100 MG PO TABS
100.0000 mg | ORAL_TABLET | Freq: Two times a day (BID) | ORAL | Status: DC
  Administered 2019-08-06 – 2019-08-09 (×7): 100 mg via ORAL
  Filled 2019-08-06 (×7): qty 1

## 2019-08-06 MED ORDER — HYDROCERIN EX CREA
TOPICAL_CREAM | Freq: Every day | CUTANEOUS | Status: DC
  Administered 2019-08-06 – 2019-08-09 (×4): via TOPICAL
  Filled 2019-08-06: qty 113

## 2019-08-06 MED ORDER — MAGNESIUM SULFATE 2 GM/50ML IV SOLN
2.0000 g | Freq: Once | INTRAVENOUS | Status: AC
  Administered 2019-08-06: 13:00:00 2 g via INTRAVENOUS
  Filled 2019-08-06: qty 50

## 2019-08-06 NOTE — Consult Note (Signed)
[facility] Nurse wound consult note Reason for Consult: foot wound  Wound type: neuropathic foot ulceration left plantar surface first metatarsal head  Bulla; intact serous filled right pretibial Venous dermatitis bilateral LE, no real evidence of cellulitis; skin changes consistent with venous stasis dx.  Pressure Injury POA: NA Measurement: 0.5cm x 0.5cm x 0cm Wound bed:100% dark black, calloused  Drainage (amount, consistency, odor) none Periwound: intact  Dressing procedure/placement/frequency: Eucerin daily for venous dermatitis No topical care to the calloused area; it appears has been treated with betadine; to keep dry and stable. It is very hard an intact at this time. Monitor bulla; if ruptures non adherent dressing only.   Discussed POC with patient and bedside nurse.  Re consult if needed, will not follow at this time. Thanks  Lexi Conaty [employer], RN,CWOCN, CNS, [place] [phone])

## 2019-08-06 NOTE — Progress Notes (Signed)
PROGRESS NOTE  Charles [name] [id number] DOB: February 01, 1941   PCP: Cassandria Anger, MD  Patient is from: Home.  Previously independent for ADL's.  Ambulates with walker  DOA: 08/03/2019 LOS: 2  Brief Narrative / Interim history: As per HPI: 78 y.o.male,w hypertension, hyperlipidemia, DM2,Diabetic neuropathy, w/ history of transmetatarsal amputation of the right foot, and also B12 deficiency, CAD , chronic diastolic CHF, P afib,OSA on CPAP, Copd presents with c/o overdose on lasix, took 30 pills by mistake per patient.  He reports he took wrong bottle of pills instead of his regular meds and took them all at once. He also notes redness of the right distal lower ext for the past 1 week.   In ED, vitals are stable. Labs with fairly stable sodium-potassium electrolytes, with hypomagnesemia COVID-19 negative.  Pt given magnesium sulfate 2gm iv x1, and also Kcl 60 meq po x1,  Poison control per ED recommended monitoring his renal function as well as potassium over nite.   Assessment & Plan: Unintentional overdose with Lasix:  -Appreciate poison control guidance. -Had about 11 L urine output since admission -Renal function and electrolytes stable.  Chronic diastolic CHF: No cardiopulmonary complaints this morning. -Had about 2.3 L urine output in 24 hours and 11 L since admission. -Respiratory status improved tremendously. -Discontinue Actos-could cause fluid retention. -Continue holding Lasix -Daily weight, intake output and renal function.  History of CAD: No anginal symptoms. -Continue home Ranexa, Imdur, statin, Plavix, aspirin, Imdur -Resume home metoprolol at low-dose.  Essential hypertension: Normotensive. -Cardiac meds as above.  Controlled IDDM-2 with neuropathic foot ulceration: CBG within fair range.  A1c 7.4 in 6/20. -Continue current regimen -Continue statin and gabapentin -Appreciate wound care input.  Permanent A. Fib: Rate controlled.  On metoprolol for  rate control.  Not on anticoagulation. -Resume home metoprolol at low-dose -Need to discuss risk of benefits of anticoagulation.  History of CVA: Stable.  No focal neuro deficits. -Continue home statin, Plavix and aspirin  Chronic COPD: Stable.  No respiratory complaint today. -Dulera and PRN breathing treatments.  Right lower extremity cellulitis in patient with venous insufficiency: Improving.  See picture. -Continue doxycycline.  Chronic urinary incontinence -Detrol-> Toviaz by patient's choice.  OSA on CPAP -Nightly CPAP.  Morbid obesity: BMI 37.17. -We will start GLP-1 inhibitors given diabetes and cardiac history. -Encourage lifestyle change to lose weight.  Hypokalemia/hypomagnesemia: Likely to Lasix overdose -Replenish and recheck.  Pressure Injury POA: NA Measurement: 0.5cm x 0.5cm x 0cm Wound bed:100% dark black, calloused  Drainage (amount, consistency, odor) none Periwound: intact  -Appreciate wound care input.  DVT prophylaxis: Subcu heparin Code Status: Full code Family Communication: Patient and/or RN. Available if any question.  Disposition Plan: SNF whenever bed is available.  Medically ready.  CSW consulted. Consultants: Wound care  Procedures:  None  Microbiology summarized: [id number] negative. MRSA PCR screening negative.  Antimicrobials: Anti-infectives (From admission, onward)   Start     Dose/Rate Route Frequency Ordered Stop   08/06/19 1200  doxycycline (VIBRA-TABS) tablet 100 mg     100 mg Oral Every 12 hours 08/06/19 0936     08/04/19 0300  doxycycline (VIBRAMYCIN) 100 mg in sodium chloride 0.9 % 250 mL IVPB  Status:  Discontinued     100 mg 125 mL/hr over 120 Minutes Intravenous Every 12 hours 08/04/19 0223 08/06/19 0935      Sch Meds:  Scheduled Meds: . aspirin EC  81 mg Oral Daily  . cholecalciferol  1,000 Units Oral Daily  .  clopidogrel  75 mg Oral Daily  . doxycycline  100 mg Oral Q12H  . gabapentin  300 mg Oral TID  .  heparin  5,000 Units Subcutaneous Q8H  . hydrocerin   Topical Daily  . insulin aspart  0-5 Units Subcutaneous QHS  . insulin aspart  0-9 Units Subcutaneous TID WC  . isosorbide mononitrate  60 mg Oral Daily  . magnesium oxide  400 mg Oral BID  . mometasone-formoterol  2 puff Inhalation BID  . pioglitazone  45 mg Oral Daily  . pravastatin  40 mg Oral Daily  . ranolazine  500 mg Oral BID  . sodium chloride flush  3 mL Intravenous Q12H  . vitamin B-12  1,000 mcg Oral Daily   Continuous Infusions: . sodium chloride     PRN Meds:.sodium chloride, acetaminophen **OR** acetaminophen, HYDROmorphone (DILAUDID) injection, ondansetron (ZOFRAN) IV, sodium chloride flush   Subjective: No major events overnight of this morning.  Reports significant improvement in his breathing and his leg pain.  Denies chest, dyspnea, dizziness, GI or GU symptoms.  On board with SNF placement.  [facility]   Objective: Vitals:   08/06/19 0500 08/06/19 0806 08/06/19 0821 08/06/19 1204  BP:   (!) 113/56 107/64  Pulse:   (!) 105 100  Resp:   15 16  Temp:   98.1 F (36.7 C) 97.7 F (36.5 C)  TempSrc:    Oral  SpO2:  93% 91% 100%  Weight: (!) 138.5 kg       Intake/Output Summary (Last 24 hours) at 08/06/2019 1610 Last data filed at 08/06/2019 0610 Gross per 24 hour  Intake 1852.69 ml  Output 1410 ml  Net 442.69 ml   Filed Weights   08/04/19 0500 08/06/19 0500  Weight: (!) 139.6 kg (!) 138.5 kg    Examination:  GENERAL: No acute distress.  Morbidly obese. HEENT: MMM.  Vision and hearing grossly intact.  NECK: Supple.  No apparent JVD.  RESP:  No IWOB.  Fair aeration bilaterally. CVS: RRR.  Normal rate.Marland Kitchen Heart sounds normal.  ABD/GI/GU: Bowel sounds present. Soft. Non tender.  MSK/EXT:  Moves extremities.  Venous stasis.  Bilateral complete and partial transmetatarsal amputation. SKIN: Erythema over lower extremity bilaterally.  No increased warmth to touch.  See picture below for more.  NEURO: Awake, alert and oriented appropriately.  No gross deficit.  PSYCH: Calm. Normal affect.           I have personally reviewed the following labs and images: CBC: Recent Labs  Lab 08/03/19 1647 08/04/19 0623  WBC 5.3 4.9  NEUTROABS 4.3  --   HGB 11.2* 10.8*  HCT 36.3* 33.6*  MCV 96.0 93.6  PLT 193 189   BMP &GFR Recent Labs  Lab 08/03/19 1647  08/03/19 2118 08/04/19 0119 08/04/19 0623 08/04/19 1437 08/05/19 0342 08/06/19 0307 08/06/19 0903  NA 140   < >  --  140 141 141 139 140  --   K 5.3*   < >  --  4.0 3.6 3.7 3.2* 3.6  --   CL 101   < >  --  100 100 100 98 102  --   CO2 27   < >  --  28 29 30 29 30   --   GLUCOSE 200*   < >  --  145* 127* 172* 115* 156*  --   BUN 26*   < >  --  22 22 19 19 17   --   CREATININE 1.30*   < >  --  1.12 1.01 1.09 1.09 1.20  --   CALCIUM 9.5   < >  --  9.2 9.0 8.9 8.8* 8.7*  --   MG 1.4*  --  1.9  --   --  1.4*  --   --  1.5*   < > = values in this interval not displayed.   Estimated Creatinine Clearance: 78.4 mL/min (by C-G formula based on SCr of 1.2 mg/dL). Liver & Pancreas: Recent Labs  Lab 08/03/19 1647 08/04/19 0623  AST 22 21  ALT 12 10  ALKPHOS 60 60  BILITOT 1.1 1.2  PROT 6.8 6.6  ALBUMIN 3.5 3.4*   No results for input(s): LIPASE, AMYLASE in the last 168 hours. No results for input(s): AMMONIA in the last 168 hours. Diabetic: No results for input(s): HGBA1C in the last 72 hours. Recent Labs  Lab 08/05/19 1159 08/05/19 1640 08/05/19 2050 08/06/19 0819 08/06/19 1202  GLUCAP 133* 157* 114* 127* 138*   Cardiac Enzymes: No results for input(s): CKTOTAL, CKMB, CKMBINDEX, TROPONINI in the last 168 hours. No results for input(s): PROBNP in the last 8760 hours. Coagulation Profile: No results for input(s): INR, PROTIME in the last 168 hours. Thyroid Function Tests: No results for input(s): TSH, T4TOTAL, FREET4, T3FREE, THYROIDAB in the last 72 hours. Lipid Profile: No results for input(s): CHOL, HDL,  LDLCALC, TRIG, CHOLHDL, LDLDIRECT in the last 72 hours. Anemia Panel: No results for input(s): VITAMINB12, FOLATE, FERRITIN, TIBC, IRON, RETICCTPCT in the last 72 hours. Urine analysis:    Component Value Date/Time   COLORURINE YELLOW 05/25/2019 [place] 05/25/2019 1353   LABSPEC 1.020 05/25/2019 1353   PHURINE 6.0 05/25/2019 1353   GLUCOSEU NEGATIVE 05/25/2019 1353   HGBUR NEGATIVE 05/25/2019 1353   BILIRUBINUR NEGATIVE 05/25/2019 1353   KETONESUR NEGATIVE 05/25/2019 1353   PROTEINUR NEGATIVE 01/10/2017 1639   UROBILINOGEN 1.0 05/25/2019 1353   NITRITE NEGATIVE 05/25/2019 1353   LEUKOCYTESUR SMALL (A) 05/25/2019 1353   Sepsis Labs: Invalid input(s): PROCALCITONIN, [place]  Microbiology: Recent Results (from the past 240 hour(s))  SARS CORONAVIRUS 2 Nasal Swab Aptima Multi Swab     Status: None   Collection Time: 08/03/19  4:47 PM   Specimen: Aptima Multi Swab; Nasal Swab  Result Value Ref Range Status   SARS Coronavirus 2 NEGATIVE NEGATIVE Final    Comment: (NOTE) SARS-CoV-2 target nucleic acids are NOT DETECTED. The SARS-CoV-2 RNA is generally detectable in upper and lower respiratory specimens during the acute phase of infection. Negative results do not preclude SARS-CoV-2 infection, do not rule out co-infections with other pathogens, and should not be used as the sole basis for treatment or other patient management decisions. Negative results must be combined with clinical observations, patient history, and epidemiological information. The expected result is Negative. Fact Sheet for Patients: SugarRoll.be Fact Sheet for Healthcare Providers: https://www.woods-mathews.com/ This test is not yet approved or cleared by the [place] FDA and  has been authorized for detection and/or diagnosis of SARS-CoV-2 by FDA under an Emergency Use Authorization (EUA). This EUA will remain  in effect (meaning this test  can be used) for the duration of the COVID-19 declaration under Section 56 4(b)(1) of the Act, 21 U.S.C. section 360bbb-3(b)(1), unless the authorization is terminated or revoked sooner. Performed at [facility], [place] [address], [place], [place] [address]   MRSA PCR Screening     Status: None   Collection Time: 08/04/19  2:50 AM   Specimen: Nasal Mucosa; Nasopharyngeal  Result Value Ref Range Status   MRSA by PCR NEGATIVE NEGATIVE Final    Comment:        The GeneXpert MRSA Assay (FDA approved for NASAL specimens only), is one component of a comprehensive MRSA colonization surveillance program. It is not intended to diagnose MRSA infection nor to guide or monitor treatment for MRSA infections. Performed at [facility], [place] [address], [place], [place] [address]     Radiology Studies: No results found.  35 minutes with more than 50% spent in reviewing records, counseling patient and coordinating care.   T. [place]  If 7PM-7AM, please contact night-coverage [email or web address] Password [email or web address] 08/06/2019, 4:10 PM

## 2019-08-07 DIAGNOSIS — L02415 Cutaneous abscess of right lower limb: Secondary | ICD-10-CM

## 2019-08-07 DIAGNOSIS — I872 Venous insufficiency (chronic) (peripheral): Secondary | ICD-10-CM

## 2019-08-07 DIAGNOSIS — L03115 Cellulitis of right lower limb: Secondary | ICD-10-CM

## 2019-08-07 DIAGNOSIS — H1013 Acute atopic conjunctivitis, bilateral: Secondary | ICD-10-CM

## 2019-08-07 LAB — BASIC METABOLIC PANEL
Anion gap: 9 (ref 5–15)
BUN: 16 mg/dL (ref 8–23)
CO2: 28 mmol/L (ref 22–32)
Calcium: 8.8 mg/dL — ABNORMAL LOW (ref 8.9–10.3)
Chloride: 101 mmol/L (ref 98–111)
Creatinine, Ser: 1.08 mg/dL (ref 0.61–1.24)
GFR calc Af Amer: >60 mL/min (ref >60)
GFR calc non Af Amer: >60 mL/min (ref >60)
Glucose, Bld: 148 mg/dL — ABNORMAL HIGH (ref 70–99)
Potassium: 3.7 mmol/L (ref 3.5–5.1)
Sodium: 138 mmol/L (ref 135–145)

## 2019-08-07 LAB — GLUCOSE, CAPILLARY
Glucose-Capillary: 161 mg/dL — ABNORMAL HIGH (ref 70–99)
Glucose-Capillary: 159 mg/dL — ABNORMAL HIGH (ref 70–99)
Glucose-Capillary: 148 mg/dL — ABNORMAL HIGH (ref 70–99)
Glucose-Capillary: 162 mg/dL — ABNORMAL HIGH (ref 70–99)

## 2019-08-07 LAB — PHOSPHORUS: Phosphorus: 2.9 mg/dL (ref 2.5–4.6)

## 2019-08-07 LAB — MAGNESIUM: Magnesium: 1.7 mg/dL (ref 1.7–2.4)

## 2019-08-07 MED ORDER — KETOTIFEN FUMARATE 0.025 % OP SOLN
1.0000 [drp] | Freq: Two times a day (BID) | OPHTHALMIC | Status: DC
  Administered 2019-08-07 – 2019-08-09 (×5): 1 [drp] via OPHTHALMIC
  Filled 2019-08-07: qty 5

## 2019-08-07 MED ORDER — FUROSEMIDE 40 MG PO TABS
40.0000 mg | ORAL_TABLET | Freq: Every day | ORAL | Status: DC
  Administered 2019-08-07: 40 mg via ORAL
  Filled 2019-08-07: qty 1

## 2019-08-07 MED ORDER — MAGNESIUM SULFATE IN D5W 1-5 GM/100ML-% IV SOLN
1.0000 g | Freq: Once | INTRAVENOUS | Status: AC
  Administered 2019-08-07: 09:00:00 1 g via INTRAVENOUS
  Filled 2019-08-07: qty 100

## 2019-08-07 MED ORDER — POTASSIUM CHLORIDE CRYS ER 20 MEQ PO TBCR
40.0000 meq | EXTENDED_RELEASE_TABLET | Freq: Once | ORAL | Status: AC
  Administered 2019-08-07: 08:00:00 40 meq via ORAL
  Filled 2019-08-07: qty 2

## 2019-08-07 NOTE — NC FL2 (Signed)
[place] LEVEL OF CARE SCREENING TOOL     IDENTIFICATION  Patient Name: Charles [name] Birthdate: 1941/02/15 Sex: male Admission Date (Current Location): 08/03/2019  [facility] and [place] Number:  Herbalist and Address:  [facility], [place] [address] [place], [place] [address]      Provider Number: [id number]  Attending Physician Name and Address:  Mercy Riding, MD  Relative Name and Phone Number:  Ketcher Lottman [phone]    Current Level of Care: Hospital Recommended Level of Care: [place] Prior Approval Number:    Date Approved/Denied:   PASRR Number: [id number]  Discharge Plan: SNF    Current Diagnoses: Patient Active Problem List   Diagnosis Date Noted  . Overdose 08/03/2019  . Urinary incontinence 05/25/2019  . Dysarthria 02/21/2019  . Humerus fracture 08/23/2018  . Chronic gingivitis 08/16/2017  . NSTEMI (non-ST elevated myocardial infarction) ([place]) 06/01/2017  . Scaly skin 05/16/2017  . C. difficile colitis 01/31/2017  . PAT (paroxysmal atrial tachycardia) ([place]) 01/15/2017  . HLD (hyperlipidemia) 01/15/2017  . Hypoglycemia 01/11/2017  . Demand ischemia of myocardium ([place])   . Diarrhea 01/10/2017  . Elevated troponin 01/10/2017  . Dehydration 01/10/2017  . Hypoglycemia due to type 2 diabetes mellitus ([place]) 01/10/2017  . Hypoglycemia associated with diabetes ([place]) 10/17/2016  . Chronic ulcer of left foot ([place]) 08/31/2016  . Acute on chronic diastolic congestive heart failure ([place]) 08/20/2016  . Venous stasis   . Lower extremity edema   . Hypercholesterolemia   . Diabetic neuropathy ([place])   . Unstable angina ([place]) 08/15/2016  . 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 Pressure ulcer 12/27/2015  . Gangrene of toe ([place]) 12/26/2015  . Acute renal failure (ARF) ([place]) 12/26/2015  . Subconjunctival hemorrhage 10/21/2015  . Cellulitis in diabetic foot ([place])   . Cellulitis 05/06/2015   . COPD exacerbation ([place]) 10/10/2014  . Hematuria 08/12/2014  . Hypokalemia 08/12/2014  . Urinary retention 08/03/2014  . Hypotension 07/26/2014  . COPD  07/26/2014  . Rash and nonspecific skin eruption 02/13/2014  . PAF (paroxysmal atrial fibrillation) ([place]) 11/13/2013  . Morbid obesity ([place]) 05/24/2012  . Type 2 diabetes, uncontrolled, with retinopathy ([place]) 02/09/2012  . NEOPLASM OF UNCERTAIN BEHAVIOR OF SKIN 12/24/2010  . WEIGHT GAIN, ABNORMAL 02/02/2010  . TOBACCO USE, QUIT 02/02/2010  . DM type 2 causing neurological disease ([place]) 07/12/2007  . HYPOGONADISM 07/12/2007  . B12 deficiency 07/12/2007  . Essential hypertension 07/12/2007  . MYOCARDIAL INFARCTION, HX OF 07/12/2007  . CAD (coronary artery disease) 07/12/2007  . History of CVA (cerebrovascular accident) 07/12/2007  . VENOUS INSUFFICIENCY, LEGS 07/12/2007  . SYMPTOM, MEMORY LOSS 07/12/2007  . DIVERTICULITIS, HX OF 07/12/2007    Orientation RESPIRATION BLADDER Height & Weight     Self, Time, Situation, Place  Normal Incontinent Weight: (!) 139.4 kg Height:     BEHAVIORAL SYMPTOMS/MOOD NEUROLOGICAL BOWEL NUTRITION STATUS      Incontinent Diet(refer to d/c summary)  AMBULATORY STATUS COMMUNICATION OF NEEDS Skin   Extensive Assist Verbally Other (Comment)(skin dry)                       Personal Care Assistance Level of Assistance  Bathing, Feeding, Dressing Bathing Assistance: Limited assistance Feeding assistance: Independent Dressing Assistance: Limited assistance     Functional Limitations Info  Sight, Hearing(HOH, no peripheral vision) Sight Info: Impaired(no peripheral vision) Hearing Info: Impaired(HOH)      SPECIAL CARE FACTORS FREQUENCY  PT (By licensed PT), OT (By licensed OT)     PT Frequency: 5x / week OT Frequency: 5x / week            Contractures Contractures Info: Not present    Additional Factors Info  Code Status, Allergies Code Status Info: No Code Allergies Info:  Lipitor,Atorvastatin, Xarelto           Current Medications (08/07/2019):  This is the current hospital active medication list Current Facility-Administered Medications  Medication Dose Route Frequency Provider Last Rate Last Dose  . 0.9 %  sodium chloride infusion  250 mL Intravenous PRN Jani Gravel, MD      . acetaminophen (TYLENOL) tablet 650 mg  650 mg Oral Q6H PRN Jani Gravel, MD   650 mg at 08/04/19 1731   Or  . acetaminophen (TYLENOL) suppository 650 mg  650 mg Rectal Q6H PRN Jani Gravel, MD      . aspirin EC tablet 81 mg  81 mg Oral Daily Jani Gravel, MD   81 mg at 08/07/19 [id number]  . cholecalciferol (VITAMIN D3) tablet 1,000 Units  1,000 Units Oral Daily Jani Gravel, MD   1,000 Units at 08/07/19 [phone]  . clopidogrel (PLAVIX) tablet 75 mg  75 mg Oral Daily Jani Gravel, MD   75 mg at 08/07/19 [id number]  . doxycycline (VIBRA-TABS) tablet 100 mg  100 mg Oral Q12H Karren Cobble, RPH   100 mg at 08/07/19 1242  . furosemide (LASIX) tablet 40 mg  40 mg Oral Daily Wendee Beavers T, MD   40 mg at 08/07/19 [id number]  . gabapentin (NEURONTIN) capsule 300 mg  300 mg Oral TID Jani Gravel, MD   300 mg at 08/07/19 [id number]  . heparin injection 5,000 Units  5,000 Units Subcutaneous Lysle Dingwall, MD   5,000 Units at 08/07/19 1243  . hydrocerin (EUCERIN) cream   Topical Daily Wendee Beavers T, MD      . HYDROmorphone (DILAUDID) injection 0.5 mg  0.5 mg Intravenous Q6H PRN Jani Gravel, MD      . insulin aspart (novoLOG) injection 0-5 Units  0-5 Units Subcutaneous QHS Jani Gravel, MD   Stopped at 08/04/19 2125  . insulin aspart (novoLOG) injection 0-9 Units  0-9 Units Subcutaneous TID WC Jani Gravel, MD   2 Units at 08/07/19 1242  . isosorbide mononitrate (IMDUR) 24 hr tablet 60 mg  60 mg Oral Daily Jani Gravel, MD   60 mg at 08/07/19 [id number]  . ketotifen (ZADITOR) 0.025 % ophthalmic solution 1 drop  1 drop Both Eyes BID Wendee Beavers T, MD   1 drop at 08/07/19 1010  . linagliptin (TRADJENTA) tablet 5 mg  5 mg Oral Daily Wendee Beavers  T, MD   5 mg at 08/07/19 [id number]  . mometasone-formoterol (DULERA) 100-5 MCG/ACT inhaler 2 puff  2 puff Inhalation BID Jani Gravel, MD   2 puff at 08/07/19 0730  . ondansetron (ZOFRAN) injection 4 mg  4 mg Intravenous Q6H PRN Jani Gravel, MD      . pravastatin (PRAVACHOL) tablet 40 mg  40 mg Oral Daily Jani Gravel, MD   40 mg at 08/07/19 [id number]  . ranolazine (RANEXA) 12 hr tablet 500 mg  500 mg Oral BID Jani Gravel, MD   500 mg at 08/07/19 [id number]  . sodium chloride flush (NS) 0.9 % injection 3 mL  3 mL Intravenous Q12H Jani Gravel, MD   3 mL at 08/05/19 0813  . sodium chloride flush (NS) 0.9 % injection 3  mL  3 mL Intravenous PRN Jani Gravel, MD      . vitamin B-12 (CYANOCOBALAMIN) tablet 1,000 mcg  1,000 mcg Oral Daily Jani Gravel, MD   1,000 mcg at 08/07/19 [id number]     Discharge Medications: Please see discharge summary for a list of discharge medications.  Relevant Imaging Results:  Relevant Lab Results:   Additional Information SSN: [id number]  Sharin Mons, RN

## 2019-08-07 NOTE — Progress Notes (Signed)
CPAP was set on 12cmh02 per his home CPAP settings.  Pt states it feel like its too much pressure.  Settings changed to Auto min 5, max 16. 2L of 02 bleed in.  Pt tolerating well. No distress noted.

## 2019-08-07 NOTE — Progress Notes (Signed)
Physical Therapy Treatment Patient Details Name: Charles [name] MRN: [id number] DOB: [birth year] Today's Date: 08/07/2019    History of Present Illness 78 yo male with onset of lasix overdose of 30 pills accidentally was admitted to manage his electrolytes and fluid.  PMHx:  R transmet amputation with cellulitis, L humerus fracture 01/2019, DM, PN, B12 defic, CAD, CHF, PAF, OSA, COPD, CVA, urinary incontinence    PT Comments    Patient seen for mobility progression. Pt tolerated gait training distance of 20 ft with min A +2 this session. Continue to progress as tolerated with anticipated d/c to SNF for further skilled PT services.      Follow Up Recommendations  SNF     Equipment Recommendations  None recommended by PT    Recommendations for Other Services       Precautions / Restrictions Precautions Precautions: Fall Precaution Comments: limited L shoulder ROM due to humerus fx 01/2019 Restrictions Weight Bearing Restrictions: No    Mobility  Bed Mobility Overal bed mobility: Needs Assistance Bed Mobility: Supine to Sit     Supine to sit: Min assist;HOB elevated     General bed mobility comments: cues for sequencing and use of rail going toward L side; assist to elevate trunk into sitting   Transfers Overall transfer level: Needs assistance Equipment used: Rolling walker (2 wheeled);1 person hand held assist Transfers: Sit to/from Stand Sit to Stand: Mod assist;From elevated surface         General transfer comment: assist to power up into standing; cues for safe hand placement  Ambulation/Gait Ambulation/Gait assistance: Min assist;+2 safety/equipment Gait Distance (Feet): 20 Feet Assistive device: Rolling walker (2 wheeled) Gait Pattern/deviations: Step-through pattern;Decreased step length - right;Decreased step length - left;Trunk flexed;Wide base of support Gait velocity: decreased   General Gait Details: assist to steady; cues for posture; pt unable to  keep safe proximity to RW in part due to needing wider RW    Stairs             Wheelchair Mobility    Modified Rankin (Stroke Patients Only)       Balance Overall balance assessment: Needs assistance Sitting-balance support: No upper extremity supported;Feet supported Sitting balance-Leahy Scale: Fair     Standing balance support: Bilateral upper extremity supported;During functional activity Standing balance-Leahy Scale: Poor                              Cognition Arousal/Alertness: Awake/alert Behavior During Therapy: WFL for tasks assessed/performed Overall Cognitive Status: History of cognitive impairments - at baseline                                 General Comments: decreased short term memory      Exercises      General Comments General comments (skin integrity, edema, etc.): pt laying in copious amounts of urine upon arrival; bilat LE wounds/edema, large blister superior to R ankle; IV leaking and RN notified       Pertinent Vitals/Pain Pain Assessment: Faces Faces Pain Scale: No hurt Pain Intervention(s): Monitored during session    Home Living                      Prior Function            PT Goals (current goals can now be found in the care plan section)  Progress towards PT goals: Progressing toward goals    Frequency    Min 2X/week      PT Plan Current plan remains appropriate    Co-evaluation              AM-PAC PT "6 Clicks" Mobility   Outcome Measure  Help needed turning from your back to your side while in a flat bed without using bedrails?: A Lot Help needed moving from lying on your back to sitting on the side of a flat bed without using bedrails?: A Lot Help needed moving to and from a bed to a chair (including a wheelchair)?: A Lot Help needed standing up from a chair using your arms (e.g., wheelchair or bedside chair)?: A Lot Help needed to walk in hospital room?: A  Little Help needed climbing 3-5 steps with a railing? : A Lot 6 Click Score: 13    End of Session Equipment Utilized During Treatment: Gait belt Activity Tolerance: Patient tolerated treatment well Patient left: with call bell/phone within reach;in chair;with chair alarm set Nurse Communication: Mobility status(IV leaking and bed soiled with urine) PT Visit Diagnosis: Other abnormalities of gait and mobility (R26.89);Muscle weakness (generalized) (M62.81);Difficulty in walking, not elsewhere classified (R26.2);Adult, failure to thrive (R62.7)     Time: [id number] PT Time Calculation (min) (ACUTE ONLY): 34 min  Charges:  $Gait Training: 8-22 mins $Therapeutic Activity: 8-22 mins                     Earney Navy, PTA Acute Rehabilitation Services Pager: [phone] Office: [phone]     Darliss Cheney 08/07/2019, 10:52 AM

## 2019-08-07 NOTE — Progress Notes (Signed)
PROGRESS NOTE  Charles [name] [id number] DOB: 10-02-1941   PCP: Cassandria Anger, MD  Patient is from: Home.  Previously independent for ADL's.  Ambulates with walker  DOA: 08/03/2019 LOS: 3  Brief Narrative / Interim history: As per HPI: 78 y.o.male,w hypertension, hyperlipidemia, DM2,Diabetic neuropathy, w/ history of transmetatarsal amputation of the right foot, and also B12 deficiency, CAD , chronic diastolic CHF, P afib,OSA on CPAP, Copd presents with c/o overdose on lasix, took 30 pills by mistake per patient.  He reports he took wrong bottle of pills instead of his regular meds and took them all at once. He also notes redness of the right distal lower ext for the past 1 week.   In ED, vitals are stable. Labs with fairly stable sodium-potassium electrolytes, with hypomagnesemia COVID-19 negative.  Pt given magnesium sulfate 2gm iv x1, and also Kcl 60 meq po x1,  Poison control per ED recommended monitoring his renal function as well as potassium over night.    See individual problem list below for more on hospital course.  Assessment & Plan: Unintentional overdose with Lasix:   -Poison control consulted on admission. -Had about 11 L urine output since admission.  Only 800 cc recorded over the last 24. -Renal function and electrolytes stable.  Chronic diastolic CHF: No cardiopulmonary complaints this morning. -About 11 L since admission but only 800 cc over the last 24 hours per chart. -Respiratory status improved tremendously. -Discontinued Actos-could cause fluid retention. -Resume home Lasix at 40 mg twice daily. -Daily weight, intake output and renal function. -Closely monitor electrolytes and replenish aggressively. -Salt and fluid restriction.  History of CAD: No anginal symptoms. -Continue home Ranexa, Imdur, statin, Plavix, aspirin, Imdur -Resumed home metoprolol at low-dose.  Essential hypertension: Normotensive. -Cardiac meds as above.  Controlled  IDDM-2 with neuropathic foot ulceration: CBG within fair range.  A1c 7.4 in 6/20. -Continue current regimen -Continue statin and gabapentin -Appreciate wound care input.  Permanent A. Fib: Rate controlled.  On metoprolol for rate control.  Not on anticoagulation. -Resumed home metoprolol at low-dose -Need to discuss risk of benefits of anticoagulation   History of CVA: Stable.  No focal neuro deficits. -Continue home statin, Plavix and aspirin  Chronic COPD: Stable.  No respiratory complaint today. -Dulera and PRN breathing treatments.  Right lower extremity cellulitis in patient with venous insufficiency: Improving.  See picture. -Continue doxycycline.  Chronic urinary incontinence -Detrol-> Toviaz by patient's choice.  OSA on CPAP -Nightly CPAP.  Morbid obesity: BMI 37.17. -We will start GLP-1 inhibitors given diabetes and cardiac history. -Encourage lifestyle change to lose weight.  Hypokalemia/hypomagnesemia: Likely to Lasix overdose -Replenish and recheck.  Pressure Injury POA: NA Measurement: 0.5cm x 0.5cm x 0cm Wound bed:100% dark black, calloused  Drainage (amount, consistency, odor) none Periwound: intact  -Appreciate wound care input.  Allergic conjunctivitis: Reports crusting and sticking of his eyelids when he walks up.  Reports using some sort of eyedrop at home.  No pain, itching or vision change.  He has some crusting with palpebral conjunctivitis on exam. -Zaditor eyedrop bilaterally.  DVT prophylaxis: Subcu heparin Code Status: Full code Family Communication: Patient and/or RN. Available if any question.  Disposition Plan: SNF whenever bed is available.  Medically ready.  CSW consulted. Consultants: Wound care  Procedures:  None  Microbiology summarized: [id number] negative. MRSA PCR screening negative.  Antimicrobials: Anti-infectives (From admission, onward)   Start     Dose/Rate Route Frequency Ordered Stop   08/06/19 1200  doxycycline  (  VIBRA-TABS) tablet 100 mg     100 mg Oral Every 12 hours 08/06/19 0936     08/04/19 0300  doxycycline (VIBRAMYCIN) 100 mg in sodium chloride 0.9 % 250 mL IVPB  Status:  Discontinued     100 mg 125 mL/hr over 120 Minutes Intravenous Every 12 hours 08/04/19 0223 08/06/19 0935      Sch Meds:  Scheduled Meds: . aspirin EC  81 mg Oral Daily  . cholecalciferol  1,000 Units Oral Daily  . clopidogrel  75 mg Oral Daily  . doxycycline  100 mg Oral Q12H  . furosemide  40 mg Oral Daily  . gabapentin  300 mg Oral TID  . heparin  5,000 Units Subcutaneous Q8H  . hydrocerin   Topical Daily  . insulin aspart  0-5 Units Subcutaneous QHS  . insulin aspart  0-9 Units Subcutaneous TID WC  . isosorbide mononitrate  60 mg Oral Daily  . ketotifen  1 drop Both Eyes BID  . linagliptin  5 mg Oral Daily  . mometasone-formoterol  2 puff Inhalation BID  . pravastatin  40 mg Oral Daily  . ranolazine  500 mg Oral BID  . sodium chloride flush  3 mL Intravenous Q12H  . vitamin B-12  1,000 mcg Oral Daily   Continuous Infusions: . sodium chloride     PRN Meds:.sodium chloride, acetaminophen **OR** acetaminophen, HYDROmorphone (DILAUDID) injection, ondansetron (ZOFRAN) IV, sodium chloride flush   Subjective: No major events overnight of this morning.  Reports some chronic of his eyes but no pain, itching or vision change.  Objective: Vitals:   08/07/19 0624 08/07/19 0730 08/07/19 0826 08/07/19 1228  BP: 108/77  109/65 (!) 120/59  Pulse: 81  (!) 102 80  Resp:    14  Temp:   98.1 F (36.7 C) (!) 97.4 F (36.3 C)  TempSrc:   Oral Oral  SpO2:  92% 94% 97%  Weight:        Intake/Output Summary (Last 24 hours) at 08/07/2019 1344 Last data filed at 08/07/2019 [id number] Gross per 24 hour  Intake -  Output 800 ml  Net -800 ml   Filed Weights   08/04/19 0500 08/06/19 0500 08/07/19 0615  Weight: (!) 139.6 kg (!) 138.5 kg (!) 139.4 kg    Examination:  GENERAL: No acute distress.  Morbidly obese. HEENT:  MMM.  Vision and hearing grossly intact.  Some crusting over his eyelids.  Palpable conjunctivitis. NECK: Supple.  No apparent JVD.  RESP:  No IWOB.  Fair aeration bilaterally but limited exam due to body habitus. CVS:  RRR. Heart sounds normal.  ABD/GI/GU: Bowel sounds present. Soft. Non tender.  MSK/EXT:  Moves extremities.  Venous stasis.  Bilateral transmetatarsal amputations. SKIN: Venous stasis.  Single bulla over right lower extremity.  No increased warmth to touch. NEURO: Awake, alert and oriented appropriately.  No gross deficit.  PSYCH: Calm. Normal affect.           I have personally reviewed the following labs and images: CBC: Recent Labs  Lab 08/03/19 1647 08/04/19 0623  WBC 5.3 4.9  NEUTROABS 4.3  --   HGB 11.2* 10.8*  HCT 36.3* 33.6*  MCV 96.0 93.6  PLT 193 189   BMP &GFR Recent Labs  Lab 08/03/19 1647  08/03/19 2118  08/04/19 0623 08/04/19 1437 08/05/19 0342 08/06/19 0307 08/06/19 0903 08/07/19 0305  NA 140   < >  --    < > 141 141 139 140  --  138  K 5.3*   < >  --    < > 3.6 3.7 3.2* 3.6  --  3.7  CL 101   < >  --    < > 100 100 98 102  --  101  CO2 27   < >  --    < > 29 30 29 30   --  28  GLUCOSE 200*   < >  --    < > 127* 172* 115* 156*  --  148*  BUN 26*   < >  --    < > 22 19 19 17   --  16  CREATININE 1.30*   < >  --    < > 1.01 1.09 1.09 1.20  --  1.08  CALCIUM 9.5   < >  --    < > 9.0 8.9 8.8* 8.7*  --  8.8*  MG 1.4*  --  1.9  --   --  1.4*  --   --  1.5* 1.7  PHOS  --   --   --   --   --   --   --   --   --  2.9   < > = values in this interval not displayed.   Estimated Creatinine Clearance: 87.3 mL/min (by C-G formula based on SCr of 1.08 mg/dL). Liver & Pancreas: Recent Labs  Lab 08/03/19 1647 08/04/19 0623  AST 22 21  ALT 12 10  ALKPHOS 60 60  BILITOT 1.1 1.2  PROT 6.8 6.6  ALBUMIN 3.5 3.4*   No results for input(s): LIPASE, AMYLASE in the last 168 hours. No results for input(s): AMMONIA in the last 168 hours. Diabetic:  No results for input(s): HGBA1C in the last 72 hours. Recent Labs  Lab 08/06/19 0819 08/06/19 1202 08/06/19 1651 08/06/19 2151 08/07/19 0814  GLUCAP 127* 138* 136* 155* 161*   Cardiac Enzymes: No results for input(s): CKTOTAL, CKMB, CKMBINDEX, TROPONINI in the last 168 hours. No results for input(s): PROBNP in the last 8760 hours. Coagulation Profile: No results for input(s): INR, PROTIME in the last 168 hours. Thyroid Function Tests: No results for input(s): TSH, T4TOTAL, FREET4, T3FREE, THYROIDAB in the last 72 hours. Lipid Profile: No results for input(s): CHOL, HDL, LDLCALC, TRIG, CHOLHDL, LDLDIRECT in the last 72 hours. Anemia Panel: No results for input(s): VITAMINB12, FOLATE, FERRITIN, TIBC, IRON, RETICCTPCT in the last 72 hours. Urine analysis:    Component Value Date/Time   COLORURINE YELLOW 05/25/2019 [place] 05/25/2019 1353   LABSPEC 1.020 05/25/2019 1353   PHURINE 6.0 05/25/2019 1353   GLUCOSEU NEGATIVE 05/25/2019 1353   HGBUR NEGATIVE 05/25/2019 1353   BILIRUBINUR NEGATIVE 05/25/2019 1353   KETONESUR NEGATIVE 05/25/2019 1353   PROTEINUR NEGATIVE 01/10/2017 1639   UROBILINOGEN 1.0 05/25/2019 1353   NITRITE NEGATIVE 05/25/2019 1353   LEUKOCYTESUR SMALL (A) 05/25/2019 1353   Sepsis Labs: Invalid input(s): PROCALCITONIN, [place]  Microbiology: Recent Results (from the past 240 hour(s))  SARS CORONAVIRUS 2 Nasal Swab Aptima Multi Swab     Status: None   Collection Time: 08/03/19  4:47 PM   Specimen: Aptima Multi Swab; Nasal Swab  Result Value Ref Range Status   SARS Coronavirus 2 NEGATIVE NEGATIVE Final    Comment: (NOTE) SARS-CoV-2 target nucleic acids are NOT DETECTED. The SARS-CoV-2 RNA is generally detectable in upper and lower respiratory specimens during the acute phase of infection. Negative results do not preclude SARS-CoV-2 infection, do not rule out co-infections with  other pathogens, and should not be used as the sole  basis for treatment or other patient management decisions. Negative results must be combined with clinical observations, patient history, and epidemiological information. The expected result is Negative. Fact Sheet for Patients: SugarRoll.be Fact Sheet for Healthcare Providers: https://www.woods-mathews.com/ This test is not yet approved or cleared by the [place] FDA and  has been authorized for detection and/or diagnosis of SARS-CoV-2 by FDA under an Emergency Use Authorization (EUA). This EUA will remain  in effect (meaning this test can be used) for the duration of the COVID-19 declaration under Section 56 4(b)(1) of the Act, 21 U.S.C. section 360bbb-3(b)(1), unless the authorization is terminated or revoked sooner. Performed at [facility], [place] [address], [place], [place] [address]   MRSA PCR Screening     Status: None   Collection Time: 08/04/19  2:50 AM   Specimen: Nasal Mucosa; Nasopharyngeal  Result Value Ref Range Status   MRSA by PCR NEGATIVE NEGATIVE Final    Comment:        The GeneXpert MRSA Assay (FDA approved for NASAL specimens only), is one component of a comprehensive MRSA colonization surveillance program. It is not intended to diagnose MRSA infection nor to guide or monitor treatment for MRSA infections. Performed at [facility], [place] [address], [place], [place] [address]     Radiology Studies: No results found.  Meeghan Skipper T. [place]  If 7PM-7AM, please contact night-coverage [email or web address] Password [email or web address] 08/07/2019, 1:44 PM

## 2019-08-07 NOTE — TOC Initial Note (Addendum)
Transition of Care [facility]) - Initial/Assessment Note    Patient Details  Name: Charles [name] MRN: [id number] Date of Birth: Nov 16, 1941  Transition of [facility]) CM/SW Contact:    Sharin Mons, RN Phone Number: 08/07/2019, 12:55 PM  Clinical Narrative:                 RNCM received consult for possible SNF placement at time of discharge. RNCM spoke with patient and patient's wife regarding PT recommendation of SNF placement at time of discharge. Patient reported that patient's spouse is currently unable to care for patient at their home given patient's current physical needs and fall risk. Patient expressed understanding of PT recommendation and is agreeable to SNF placement at time of discharge. Patient reports preference for [facility] with [facility] being a second choice if bed not available.  RNCM discussed insurance authorization process and provided Medicare SNF ratings list. Patient expressed being hopeful for rehab and to feel better soon. No further questions reported at this time. RNCM to continue to follow and assist with discharge planning needs.  Lebrandon Huska (Spouse)      [phone]       Expected Discharge Plan: Skilled Nursing Facility Barriers to Discharge: Continued Medical Work up   Patient Goals and CMS Choice Patient states their goals for this hospitalization and ongoing recovery are:: to get better CMS Medicare.gov Compare Post Acute Care list provided to:: Patient Choice offered to / list presented to : Patient, Spouse  Expected Discharge Plan and Services Expected Discharge Plan: [place]   Discharge Planning Services: CM Consult   Living arrangements for the past 2 months: Single Family Home                                      Prior Living Arrangements/Services Living arrangements for the past 2 months: Single Family Home Lives with:: Spouse Patient language and need for interpreter reviewed:: Yes Do you feel safe  going back to the place where you live?: Yes      Need for Family Participation in Patient Care: Yes (Comment) Care giver support system in place?: Yes (comment)   Criminal Activity/Legal Involvement Pertinent to Current Situation/Hospitalization: No - Comment as needed  Activities of Daily Living      Permission Sought/Granted Permission sought to share information with : Case Manager Permission granted to share information with : Yes, Verbal Permission Granted  Share Information with NAME: Charles [name] [phone]           Emotional Assessment Appearance:: Appears stated age Attitude/Demeanor/Rapport: Engaged Affect (typically observed): Accepting Orientation: : Oriented to Self, Oriented to Place, Oriented to  Time, Oriented to Situation Alcohol / Substance Use: Not Applicable Psych Involvement: No (comment)  Admission diagnosis:  Intentional drug overdose, initial encounter ([place]) [T50.902A] Accidental furosemide poisoning, initial encounter [T50.1X1A] Patient Active Problem List   Diagnosis Date Noted  . Overdose 08/03/2019  . Urinary incontinence 05/25/2019  . Dysarthria 02/21/2019  . Humerus fracture 08/23/2018  . Chronic gingivitis 08/16/2017  . NSTEMI (non-ST elevated myocardial infarction) ([place]) 06/01/2017  . Scaly skin 05/16/2017  . C. difficile colitis 01/31/2017  . PAT (paroxysmal atrial tachycardia) ([place]) 01/15/2017  . HLD (hyperlipidemia) 01/15/2017  . Hypoglycemia 01/11/2017  . Demand ischemia of myocardium ([place])   . Diarrhea 01/10/2017  . Elevated troponin 01/10/2017  . Dehydration 01/10/2017  . Hypoglycemia due to type 2 diabetes mellitus (  [place]) 01/10/2017  . Hypoglycemia associated with diabetes ([place]) 10/17/2016  . Chronic ulcer of left foot ([place]) 08/31/2016  . Acute on chronic diastolic congestive heart failure ([place]) 08/20/2016  . Venous stasis   . Lower extremity edema   . Hypercholesterolemia   . Diabetic neuropathy ([place])   .  Unstable angina ([place]) 08/15/2016  . 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 Pressure ulcer 12/27/2015  . Gangrene of toe ([place]) 12/26/2015  . Acute renal failure (ARF) ([place]) 12/26/2015  . Subconjunctival hemorrhage 10/21/2015  . Cellulitis in diabetic foot ([place])   . Cellulitis 05/06/2015  . COPD exacerbation ([place]) 10/10/2014  . Hematuria 08/12/2014  . Hypokalemia 08/12/2014  . Urinary retention 08/03/2014  . Hypotension 07/26/2014  . COPD  07/26/2014  . Rash and nonspecific skin eruption 02/13/2014  . PAF (paroxysmal atrial fibrillation) ([place]) 11/13/2013  . Morbid obesity ([place]) 05/24/2012  . Type 2 diabetes, uncontrolled, with retinopathy ([place]) 02/09/2012  . NEOPLASM OF UNCERTAIN BEHAVIOR OF SKIN 12/24/2010  . WEIGHT GAIN, ABNORMAL 02/02/2010  . TOBACCO USE, QUIT 02/02/2010  . DM type 2 causing neurological disease ([place]) 07/12/2007  . HYPOGONADISM 07/12/2007  . B12 deficiency 07/12/2007  . Essential hypertension 07/12/2007  . MYOCARDIAL INFARCTION, HX OF 07/12/2007  . CAD (coronary artery disease) 07/12/2007  . History of CVA (cerebrovascular accident) 07/12/2007  . VENOUS INSUFFICIENCY, LEGS 07/12/2007  . SYMPTOM, MEMORY LOSS 07/12/2007  . DIVERTICULITIS, HX OF 07/12/2007   PCP:  Cassandria Anger, MD Pharmacy:   [facility], [place] [address] Phone: [phone] Fax: [phone]  [facility] [address] [place] [address] Phone: [phone] Fax: [phone]     Social Determinants of Health (SDOH) Interventions    Readmission Risk Interventions No flowsheet data found.

## 2019-08-08 DIAGNOSIS — E1159 Type 2 diabetes mellitus with other circulatory complications: Secondary | ICD-10-CM

## 2019-08-08 DIAGNOSIS — E538 Deficiency of other specified B group vitamins: Secondary | ICD-10-CM

## 2019-08-08 DIAGNOSIS — I5032 Chronic diastolic (congestive) heart failure: Secondary | ICD-10-CM

## 2019-08-08 DIAGNOSIS — T50901D Poisoning by unspecified drugs, medicaments and biological substances, accidental (unintentional), subsequent encounter: Secondary | ICD-10-CM

## 2019-08-08 LAB — BASIC METABOLIC PANEL
Anion gap: 8 (ref 5–15)
BUN: 16 mg/dL (ref 8–23)
CO2: 29 mmol/L (ref 22–32)
Calcium: 8.8 mg/dL — ABNORMAL LOW (ref 8.9–10.3)
Chloride: 101 mmol/L (ref 98–111)
Creatinine, Ser: 0.99 mg/dL (ref 0.61–1.24)
GFR calc Af Amer: >60 mL/min (ref >60)
GFR calc non Af Amer: >60 mL/min (ref >60)
Glucose, Bld: 136 mg/dL — ABNORMAL HIGH (ref 70–99)
Potassium: 4 mmol/L (ref 3.5–5.1)
Sodium: 138 mmol/L (ref 135–145)

## 2019-08-08 LAB — GLUCOSE, CAPILLARY
Glucose-Capillary: 116 mg/dL — ABNORMAL HIGH (ref 70–99)
Glucose-Capillary: 128 mg/dL — ABNORMAL HIGH (ref 70–99)
Glucose-Capillary: 147 mg/dL — ABNORMAL HIGH (ref 70–99)
Glucose-Capillary: 155 mg/dL — ABNORMAL HIGH (ref 70–99)
Glucose-Capillary: 167 mg/dL — ABNORMAL HIGH (ref 70–99)

## 2019-08-08 LAB — NOVEL CORONAVIRUS, NAA (HOSP ORDER, SEND-OUT TO REF LAB; TAT 18-24 HRS): SARS-CoV-2, NAA: NOT DETECTED

## 2019-08-08 LAB — MAGNESIUM: Magnesium: 1.7 mg/dL (ref 1.7–2.4)

## 2019-08-08 MED ORDER — APIXABAN 5 MG PO TABS
5.0000 mg | ORAL_TABLET | Freq: Two times a day (BID) | ORAL | Status: DC
  Administered 2019-08-08 – 2019-08-09 (×3): 5 mg via ORAL
  Filled 2019-08-08 (×3): qty 1

## 2019-08-08 MED ORDER — DOXYCYCLINE HYCLATE 100 MG PO TABS: 100.0000 mg | ORAL_TABLET | Freq: Two times a day (BID) | ORAL | 0 refills | Status: DC

## 2019-08-08 MED ORDER — APIXABAN 5 MG PO TABS: 5.0000 mg | ORAL_TABLET | Freq: Two times a day (BID) | ORAL | 0 refills | Status: DC

## 2019-08-08 MED ORDER — LINAGLIPTIN 5 MG PO TABS: 5.0000 mg | ORAL_TABLET | Freq: Every day | ORAL | 0 refills | Status: DC

## 2019-08-08 MED ORDER — TORSEMIDE 20 MG PO TABS: 40.0000 mg | ORAL_TABLET | Freq: Two times a day (BID) | ORAL | 0 refills | Status: DC

## 2019-08-08 MED ORDER — TORSEMIDE 20 MG PO TABS
40.0000 mg | ORAL_TABLET | Freq: Two times a day (BID) | ORAL | Status: DC
  Administered 2019-08-08 – 2019-08-09 (×3): 40 mg via ORAL
  Filled 2019-08-08 (×3): qty 2

## 2019-08-08 MED ORDER — GABAPENTIN 300 MG PO CAPS
300.0000 mg | ORAL_CAPSULE | Freq: Two times a day (BID) | ORAL | Status: DC
  Administered 2019-08-08 – 2019-08-09 (×2): 300 mg via ORAL
  Filled 2019-08-08 (×2): qty 1

## 2019-08-08 NOTE — Progress Notes (Signed)
Pt received SNF bed approval @ [facility] with pt acceptance, however,  bed will be ready on tomorrow by 12:00. NCM will continue to monitor transition to facility. NCM made pt Charles [name] / MD aware. Whitman Hero RN,BSN,CM

## 2019-08-08 NOTE — Care Management Important Message (Signed)
Important Message  Patient Details  Name: Charles [name] MRN: [id number] Date of Birth: Jun 29, 1941   Medicare Important Message Given:  Yes     Memory Argue 08/08/2019, 3:39 PM   IM  HAS BEEN SIGNED BY PATIENT

## 2019-08-08 NOTE — Progress Notes (Signed)
Occupational Therapy Treatment Patient Details Name: Charles [name] MRN: [id number] DOB: Dec 04, 1941 Today's Date: 08/08/2019    History of present illness 78 yo male with onset of lasix overdose of 30 pills accidentally was admitted to manage his electrolytes and fluid.  PMHx:  R transmet amputation with cellulitis, L humerus fracture 01/2019, DM, PN, B12 defic, CAD, CHF, PAF, OSA, COPD, CVA, urinary incontinence   OT comments  Pt progressing toward OT goals. Pt able to complete sit <> stands and lateral stepping with mod A this session. Pt still requiring cueing for safety awareness and Rw management. SNF remains appropriate venue for d/c.    Follow Up Recommendations  SNF;Supervision/Assistance - 24 hour    Equipment Recommendations  (defer to next venue)       Precautions / Restrictions Precautions Precautions: Fall Precaution Comments: limited L shoulder ROM due to humerus fx 01/2019 Required Braces or Orthoses: Other Brace Other Brace: R foot ortho shoe (not at hosp) Restrictions Weight Bearing Restrictions: No       Mobility Bed Mobility Overal bed mobility: Needs Assistance Bed Mobility: Supine to Sit     Supine to sit: Min assist;HOB elevated Sit to supine: Mod assist   General bed mobility comments: Pt required cueing for technique and positioning  Transfers Overall transfer level: Needs assistance Equipment used: Rolling walker (2 wheeled) Transfers: Sit to/from Stand Sit to Stand: Mod assist;From elevated surface         General transfer comment: assist to power up into standing; cues for safe hand placement    Balance Overall balance assessment: Needs assistance Sitting-balance support: No upper extremity supported;Feet supported Sitting balance-Leahy Scale: Fair     Standing balance support: Bilateral upper extremity supported;During functional activity Standing balance-Leahy Scale: Poor Standing balance comment: reliant on BUE support and external  support                           ADL either performed or assessed with clinical judgement        Vision   Vision Assessment?: No apparent visual deficits          Cognition Arousal/Alertness: Awake/alert Behavior During Therapy: WFL for tasks assessed/performed Overall Cognitive Status: History of cognitive impairments - at baseline                                 General Comments: decreased short term memory        Exercises Exercises: General Upper Extremity General Exercises - Upper Extremity Shoulder Flexion: AROM;Both;10 reps(closed chain BUE shoulder flex) Shoulder Horizontal ABduction: AROM;Both;20 reps;Seated      General Comments Pt very unsure of abilities, required cueing for initiation and attention to task    Pertinent Vitals/ Pain       Pain Assessment: No/denies pain         Frequency  Min 2X/week        Progress Toward Goals  OT Goals(current goals can now be found in the care plan section)  Progress towards OT goals: Progressing toward goals  Acute Rehab OT Goals Patient Stated Goal: to go to rehab and get stronger  OT Goal Formulation: With patient Time For Goal Achievement: 08/19/19 Potential to Achieve Goals: Good  Plan Discharge plan remains appropriate       AM-PAC OT "6 Clicks" Daily Activity     Outcome Measure   Help from another person  eating meals?: A Little Help from another person taking care of personal grooming?: A Little Help from another person toileting, which includes using toliet, bedpan, or urinal?: A Lot Help from another person bathing (including washing, rinsing, drying)?: A Lot Help from another person to put on and taking off regular upper body clothing?: A Lot Help from another person to put on and taking off regular lower body clothing?: Total 6 Click Score: 13    End of Session Equipment Utilized During Treatment: Gait belt;Rolling walker  OT Visit Diagnosis: Other  abnormalities of gait and mobility (R26.89);Pain;Muscle weakness (generalized) (M62.81)   Activity Tolerance Patient tolerated treatment well   Patient Left in bed;with call bell/phone within reach;with bed alarm set;Other (comment);with family/visitor present   Nurse Communication Mobility status;Patient requests pain meds        Time: [id number] OT Time Calculation (min): 28 min  Charges: OT General Charges $OT Visit: 1 Visit OT Treatments $Therapeutic Activity: 23-37 mins   Curtis Sites OTR/L  08/08/2019, 4:33 PM

## 2019-08-08 NOTE — Discharge Summary (Signed)
Physician Discharge Summary  Charles [name] [id number] DOB: 07/28/1941 DOA: 08/03/2019  PCP: Cassandria Anger, MD  Admit date: 08/03/2019 Discharge date: 08/08/2019  Admitted From: Home Disposition: SNF  Recommendations for Outpatient Follow-up:  1. Follow up with PCP in 1-2 weeks 2. Please obtain CBC/BMP/Mag at follow up 3. Please follow up on the following pending results: None  Home Health: Not applicable Equipment/Devices: Not applicable  Discharge Condition: Stable CODE STATUS: Full code  Hospital Course: As per HPI: 78 y.o.male,w hypertension, hyperlipidemia, DM2,Diabetic neuropathy, w/ history of transmetatarsal amputation of the right foot, and also B12 deficiency, CAD , chronic diastolic CHF, P afib,OSA on CPAP, Copd presents with c/o overdose on lasix, took 30 pills by mistake per patient. He reports he took wrong bottle of pills instead of his regular meds and took them all at once. He also notes redness of the right distal lower ext for the past 1 week.   In ED, vitals are stable. Labs with fairly stable sodium-potassium electrolytes, with hypomagnesemia COVID-19 negative. Pt given magnesium sulfate 2gm iv x1, and also Kcl 60 meq po x1,  Poison control per ED recommended monitoring his renal function as well as potassium over night.    Patient diuresed well and felt better from cardiopulmonary standpoint.  Electrolytes closely monitored and replenished.  Renal function remained stable.  He was also started on doxycycline for lower extremity cellulitis with improvement.   Evaluated by PT/OT who recommended SNF prior to returning home.  Patient was reportedly ambulatory with walker at baseline.  See individual problem list below for more on hospital course.  Discharge Diagnoses:  Unintentional overdose with Lasix:   -Poison control consulted on admission. -Had about 11 L urine output since admission.  Only 800 cc recorded over the last 24. -Renal  function and electrolytes stable.  Chronic diastolic CHF: No cardiopulmonary complaints this morning. -About 11 L since admission but only 800 cc over the last 24 hours per chart. -Respiratory status improved tremendously. -Replaced Actos with Tradjenta. -Change Lasix to torsemide for better diuresis. -Daily weight, intake output -Check renal function and magnesium level in about a week. -Salt and fluid restriction.  History of CAD: No anginal symptoms. -Continue home metoprolol, Ranexa, Imdur, statin, Imdur -Changing Plavix to Eliquis given history of A. fib.  Essential hypertension: Normotensive. -Cardiac meds as above.  Controlled IDDM-2 with neuropathic foot ulceration: CBG within fair range.  A1c 7.4 in 6/20. -Discharged on home medications -Change Actos to Tradjenta in the setting of CHF/fluid overload.  Permanent A. Fib: Rate controlled.  On metoprolol for rate control.  Not on anticoagulation. -Discharged on home metoprolol. -Started Eliquis.  Stopped Plavix.  Patient has no history of GI bleed.  History of CVA: Stable.  No focal neuro deficits. -Continue home statin -Stop Cardizem aspirin.  Started Eliquis in the setting of A. fib.  Chronic COPD: Stable.  No respiratory complaint today. -Discharged on home Symbicort and PRN breathing treatments.  Right lower extremity cellulitis in patient with venous insufficiency: Improving.  See picture. -Discharged on doxycycline for 10 more days  Chronic urinary incontinence -Discharged on home Detrol.  OSA on CPAP -Nightly CPAP.  Morbid obesity: BMI 37.17. -Encourage lifestyle change to lose weight.  Hypokalemia/hypomagnesemia: Likely to Lasix overdose.  Replenished and resolved.  Pressure Injury POA: NA Measurement:0.5cm x 0.5cm x 0cm Wound bed:100% dark black, calloused Drainage (amount, consistency, odor)none Periwound:intact -Recommend daily dressing.  Allergic conjunctivitis:  -Zaditor  eyedrop as needed  Discharge Instructions  Discharge Instructions    Diet - low sodium heart healthy   Complete by: As directed    Diet Carb Modified   Complete by: As directed    Increase activity slowly   Complete by: As directed      Allergies as of 08/08/2019      Reactions   Lipitor [atorvastatin]    achy   Xarelto [rivaroxaban] Rash      Medication List    STOP taking these medications   aspirin EC 81 MG tablet   clopidogrel 75 MG tablet Commonly known as: PLAVIX   furosemide 80 MG tablet Commonly known as: LASIX   pioglitazone 45 MG tablet Commonly known as: ACTOS     TAKE these medications   acetaminophen 500 MG tablet Commonly known as: TYLENOL Take 1,000 mg by mouth at bedtime.   albuterol 108 (90 Base) MCG/ACT inhaler Commonly known as: VENTOLIN HFA Inhale 1-2 puffs into the lungs every 6 (six) hours as needed for wheezing or shortness of breath.   apixaban 5 MG Tabs tablet Commonly known as: ELIQUIS Take 1 tablet (5 mg total) by mouth 2 (two) times daily.   cholecalciferol 1000 units tablet Commonly known as: VITAMIN D Take 1,000 Units by mouth daily.   collagenase ointment Commonly known as: Santyl Apply 1 application topically daily.   doxycycline 100 MG tablet Commonly known as: VIBRA-TABS Take 1 tablet (100 mg total) by mouth every 12 (twelve) hours.   Fluticasone-Salmeterol 100-50 MCG/DOSE Aepb Commonly known as: Advair Diskus Inhale 1 puff into the lungs 2 (two) times daily.   gabapentin 300 MG capsule Commonly known as: NEURONTIN TAKE 1 CAPSULE THREE TIMES DAILY What changed:   how much to take  how to take this  when to take this  additional instructions   insulin aspart 100 UNIT/ML FlexPen Commonly known as: NOVOLOG Inject 20 Units into the skin 4 (four) times daily - after meals and at bedtime. Follow SS What changed:   how much to take  when to take this  additional instructions   isosorbide mononitrate 60  MG 24 hr tablet Commonly known as: IMDUR TAKE 1 TABLET EVERY DAY   linagliptin 5 MG Tabs tablet Commonly known as: TRADJENTA Take 1 tablet (5 mg total) by mouth daily. Start taking on: August 09, 2019   metFORMIN 500 MG tablet Commonly known as: GLUCOPHAGE TAKE 1 TABLET TWICE DAILY WITH A MEAL What changed: See the new instructions.   metoprolol tartrate 25 MG tablet Commonly known as: LOPRESSOR Take 1 tablet (25 mg total) by mouth 2 (two) times daily.   nitroGLYCERIN 0.4 MG SL tablet Commonly known as: NITROSTAT DISSOLVE 1 TABLET (0.4 MG TOTAL) UNDER THE TONGUE EVERY 5 (FIVE) MINUTES AS NEEDED (UP TO 3 DOSES) FOR CHEST PAIN What changed: See the new instructions.   pravastatin 40 MG tablet Commonly known as: PRAVACHOL TAKE 1 TABLET EVERY DAY   ranolazine 500 MG 12 hr tablet Commonly known as: RANEXA TAKE 1 TABLET TWICE DAILY   tolterodine 4 MG 24 hr capsule Commonly known as: Detrol LA Take 1 capsule (4 mg total) by mouth daily.   torsemide 20 MG tablet Commonly known as: DEMADEX Take 2 tablets (40 mg total) by mouth 2 (two) times daily.   triamcinolone ointment 0.5 % Commonly known as: KENALOG Apply 1 application topically 2 (two) times daily.   vitamin B-12 1000 MCG tablet Commonly known as: CYANOCOBALAMIN Take 1,000 mcg by mouth daily.  Consultations:  Poison control  Procedures/Studies:  2D Echo: None  No results found.   Subjective: No major events overnight of this morning.  Reports feeling well.  No complaints.  Denies chest pain, dyspnea, GI or GU symptoms.  Feels ready to go to rehab.  Discussed plan with patient and patient's wife who was available over the phone during this encounter.  All questions answered.   Discharge Exam: Vitals:   08/08/19 0835 08/08/19 0836  BP:  101/65  Pulse: 94 94  Resp: 18 16  Temp:    SpO2: 95% 95%    GENERAL: No acute distress.  Sitting on bedside chair. HEENT: MMM.  Vision and hearing grossly  intact.  NECK: Supple.  No apparent JVD.  LUNGS:  No IWOB.  Fair aeration bilaterally but limited exam due to body habitus.Marland Kitchen HEART: IR.  Normal rate.Marland Kitchen Heart sounds normal.  ABD: Bowel sounds present. Soft. Non tender.  MSK/EXT:  Moves all extremities.  Venous insufficiency bilaterally.  Mild erythema but no increased warmth to touch.  Bilateral transmetatarsal amputations.  SKIN: Venous stasis bilaterally. NEURO: Awake, alert and oriented appropriately.  No gross deficit.  PSYCH: Calm. Normal affect.   The results of significant diagnostics from this hospitalization (including imaging, microbiology, ancillary and laboratory) are listed below for reference.     Microbiology: Recent Results (from the past 240 hour(s))  SARS CORONAVIRUS 2 Nasal Swab Aptima Multi Swab     Status: None   Collection Time: 08/03/19  4:47 PM   Specimen: Aptima Multi Swab; Nasal Swab  Result Value Ref Range Status   SARS Coronavirus 2 NEGATIVE NEGATIVE Final    Comment: (NOTE) SARS-CoV-2 target nucleic acids are NOT DETECTED. The SARS-CoV-2 RNA is generally detectable in upper and lower respiratory specimens during the acute phase of infection. Negative results do not preclude SARS-CoV-2 infection, do not rule out co-infections with other pathogens, and should not be used as the sole basis for treatment or other patient management decisions. Negative results must be combined with clinical observations, patient history, and epidemiological information. The expected result is Negative. Fact Sheet for Patients: SugarRoll.be Fact Sheet for Healthcare Providers: https://www.woods-mathews.com/ This test is not yet approved or cleared by the [place] FDA and  has been authorized for detection and/or diagnosis of SARS-CoV-2 by FDA under an Emergency Use Authorization (EUA). This EUA will remain  in effect (meaning this test can be used) for the duration of the  COVID-19 declaration under Section 56 4(b)(1) of the Act, 21 U.S.C. section 360bbb-3(b)(1), unless the authorization is terminated or revoked sooner. Performed at [facility], [place] [address], [place], [place] [address]   MRSA PCR Screening     Status: None   Collection Time: 08/04/19  2:50 AM   Specimen: Nasal Mucosa; Nasopharyngeal  Result Value Ref Range Status   MRSA by PCR NEGATIVE NEGATIVE Final    Comment:        The GeneXpert MRSA Assay (FDA approved for NASAL specimens only), is one component of a comprehensive MRSA colonization surveillance program. It is not intended to diagnose MRSA infection nor to guide or monitor treatment for MRSA infections. Performed at [facility], [place] [address], [place], [place] [address]   Novel Coronavirus, NAA (hospital order; send-out to ref lab)     Status: None   Collection Time: 08/07/19  4:30 PM   Specimen: Nasopharyngeal Swab; Respiratory  Result Value Ref Range Status   SARS-CoV-2, NAA NOT DETECTED NOT DETECTED  Final    Comment: (NOTE) This test was developed and its performance characteristics determined by [employer]. This test has not been FDA cleared or approved. This test has been authorized by FDA under an Emergency Use Authorization (EUA). This test is only authorized for the duration of time the declaration that circumstances exist justifying the authorization of the emergency use of in vitro diagnostic tests for detection of SARS-CoV-2 virus and/or diagnosis of COVID-19 infection under section 564(b)(1) of the Act, 21 U.S.C. KA:123727), unless the authorization is terminated or revoked sooner. When diagnostic testing is negative, the possibility of a false negative result should be considered in the context of a patient's recent exposures and the presence of clinical signs and symptoms consistent with COVID-19. An individual without symptoms of COVID-19 and who is not shedding SARS-CoV-2  virus would expect to have a negative (not detected) result in this assay. Performed  At: [facility] [address] [place], [place] [id number] Rush Farmer MD [id number]    [place]  Final    Comment: Performed at [facility], [place] [address] [place], [place] [address]     Labs: BNP (last 3 results) No results for input(s): BNP in the last 8760 hours. Basic Metabolic Panel: Recent Labs  Lab 08/03/19 2118  08/04/19 1437 08/05/19 0342 08/06/19 0307 08/06/19 0903 08/07/19 0305 08/08/19 0238  NA  --    < > 141 139 140  --  138 138  K  --    < > 3.7 3.2* 3.6  --  3.7 4.0  CL  --    < > 100 98 102  --  101 101  CO2  --    < > 30 29 30   --  28 29  GLUCOSE  --    < > 172* 115* 156*  --  148* 136*  BUN  --    < > 19 19 17   --  16 16  CREATININE  --    < > 1.09 1.09 1.20  --  1.08 0.99  CALCIUM  --    < > 8.9 8.8* 8.7*  --  8.8* 8.8*  MG 1.9  --  1.4*  --   --  1.5* 1.7 1.7  PHOS  --   --   --   --   --   --  2.9  --    < > = values in this interval not displayed.   Liver Function Tests: Recent Labs  Lab 08/03/19 1647 08/04/19 0623  AST 22 21  ALT 12 10  ALKPHOS 60 60  BILITOT 1.1 1.2  PROT 6.8 6.6  ALBUMIN 3.5 3.4*   No results for input(s): LIPASE, AMYLASE in the last 168 hours. No results for input(s): AMMONIA in the last 168 hours. CBC: Recent Labs  Lab 08/03/19 1647 08/04/19 0623  WBC 5.3 4.9  NEUTROABS 4.3  --   HGB 11.2* 10.8*  HCT 36.3* 33.6*  MCV 96.0 93.6  PLT 193 189   Cardiac Enzymes: No results for input(s): CKTOTAL, CKMB, CKMBINDEX, TROPONINI in the last 168 hours. BNP: Invalid input(s): POCBNP CBG: Recent Labs  Lab 08/07/19 1226 08/07/19 1628 08/07/19 2157 08/08/19 0819 08/08/19 1155  GLUCAP 159* 148* 162* 167* 128*   D-Dimer No results for input(s): DDIMER in the last 72 hours. Hgb A1c No results for input(s): HGBA1C in the last 72 hours. Lipid Profile No results for input(s): CHOL,  HDL, LDLCALC, TRIG, CHOLHDL, LDLDIRECT in  the last 72 hours. Thyroid function studies No results for input(s): TSH, T4TOTAL, T3FREE, THYROIDAB in the last 72 hours.  Invalid input(s): FREET3 Anemia work up No results for input(s): VITAMINB12, FOLATE, FERRITIN, TIBC, IRON, RETICCTPCT in the last 72 hours. Urinalysis    Component Value Date/Time   COLORURINE YELLOW 05/25/2019 1353   APPEARANCEUR CLEAR 05/25/2019 1353   LABSPEC 1.020 05/25/2019 1353   PHURINE 6.0 05/25/2019 1353   GLUCOSEU NEGATIVE 05/25/2019 1353   HGBUR NEGATIVE 05/25/2019 1353   BILIRUBINUR NEGATIVE 05/25/2019 1353   KETONESUR NEGATIVE 05/25/2019 1353   PROTEINUR NEGATIVE 01/10/2017 1639   UROBILINOGEN 1.0 05/25/2019 1353   NITRITE NEGATIVE 05/25/2019 1353   LEUKOCYTESUR SMALL (A) 05/25/2019 1353   Sepsis Labs Invalid input(s): PROCALCITONIN,  WBC,  LACTICIDVEN   Time coordinating discharge: 40 minutes  SIGNED:  Mercy Riding, MD  [facility] 08/08/2019, 1:50 PM  If 7PM-7AM, please contact night-coverage [email or web address] Password [email or web address]

## 2019-08-09 DIAGNOSIS — T50901D Poisoning by unspecified drugs, medicaments and biological substances, accidental (unintentional), subsequent encounter: Secondary | ICD-10-CM | POA: Diagnosis not present

## 2019-08-09 DIAGNOSIS — R262 Difficulty in walking, not elsewhere classified: Secondary | ICD-10-CM | POA: Diagnosis not present

## 2019-08-09 DIAGNOSIS — M6281 Muscle weakness (generalized): Secondary | ICD-10-CM | POA: Diagnosis not present

## 2019-08-09 DIAGNOSIS — E1142 Type 2 diabetes mellitus with diabetic polyneuropathy: Secondary | ICD-10-CM | POA: Diagnosis not present

## 2019-08-09 DIAGNOSIS — R0782 Intercostal pain: Secondary | ICD-10-CM | POA: Diagnosis not present

## 2019-08-09 DIAGNOSIS — E559 Vitamin D deficiency, unspecified: Secondary | ICD-10-CM | POA: Diagnosis not present

## 2019-08-09 DIAGNOSIS — I5032 Chronic diastolic (congestive) heart failure: Secondary | ICD-10-CM | POA: Diagnosis not present

## 2019-08-09 DIAGNOSIS — I6930 Unspecified sequelae of cerebral infarction: Secondary | ICD-10-CM | POA: Diagnosis not present

## 2019-08-09 DIAGNOSIS — R1312 Dysphagia, oropharyngeal phase: Secondary | ICD-10-CM | POA: Diagnosis not present

## 2019-08-09 DIAGNOSIS — R3981 Functional urinary incontinence: Secondary | ICD-10-CM | POA: Diagnosis not present

## 2019-08-09 DIAGNOSIS — T887XXA Unspecified adverse effect of drug or medicament, initial encounter: Secondary | ICD-10-CM | POA: Diagnosis not present

## 2019-08-09 DIAGNOSIS — J189 Pneumonia, unspecified organism: Secondary | ICD-10-CM | POA: Diagnosis not present

## 2019-08-09 DIAGNOSIS — Z743 Need for continuous supervision: Secondary | ICD-10-CM | POA: Diagnosis not present

## 2019-08-09 DIAGNOSIS — D519 Vitamin B12 deficiency anemia, unspecified: Secondary | ICD-10-CM | POA: Diagnosis not present

## 2019-08-09 DIAGNOSIS — E11621 Type 2 diabetes mellitus with foot ulcer: Secondary | ICD-10-CM | POA: Diagnosis not present

## 2019-08-09 DIAGNOSIS — I4891 Unspecified atrial fibrillation: Secondary | ICD-10-CM | POA: Diagnosis not present

## 2019-08-09 DIAGNOSIS — R451 Restlessness and agitation: Secondary | ICD-10-CM | POA: Diagnosis not present

## 2019-08-09 DIAGNOSIS — E785 Hyperlipidemia, unspecified: Secondary | ICD-10-CM | POA: Diagnosis not present

## 2019-08-09 DIAGNOSIS — I209 Angina pectoris, unspecified: Secondary | ICD-10-CM | POA: Diagnosis not present

## 2019-08-09 DIAGNOSIS — E1140 Type 2 diabetes mellitus with diabetic neuropathy, unspecified: Secondary | ICD-10-CM | POA: Diagnosis not present

## 2019-08-09 DIAGNOSIS — G4733 Obstructive sleep apnea (adult) (pediatric): Secondary | ICD-10-CM | POA: Diagnosis not present

## 2019-08-09 DIAGNOSIS — L03115 Cellulitis of right lower limb: Secondary | ICD-10-CM | POA: Diagnosis not present

## 2019-08-09 DIAGNOSIS — R0789 Other chest pain: Secondary | ICD-10-CM | POA: Diagnosis not present

## 2019-08-09 DIAGNOSIS — R5381 Other malaise: Secondary | ICD-10-CM | POA: Diagnosis not present

## 2019-08-09 DIAGNOSIS — I2510 Atherosclerotic heart disease of native coronary artery without angina pectoris: Secondary | ICD-10-CM | POA: Diagnosis not present

## 2019-08-09 DIAGNOSIS — F419 Anxiety disorder, unspecified: Secondary | ICD-10-CM | POA: Diagnosis not present

## 2019-08-09 DIAGNOSIS — E1159 Type 2 diabetes mellitus with other circulatory complications: Secondary | ICD-10-CM | POA: Diagnosis not present

## 2019-08-09 DIAGNOSIS — J449 Chronic obstructive pulmonary disease, unspecified: Secondary | ICD-10-CM | POA: Diagnosis not present

## 2019-08-09 DIAGNOSIS — T50904A Poisoning by unspecified drugs, medicaments and biological substances, undetermined, initial encounter: Secondary | ICD-10-CM | POA: Diagnosis not present

## 2019-08-09 DIAGNOSIS — Z8673 Personal history of transient ischemic attack (TIA), and cerebral infarction without residual deficits: Secondary | ICD-10-CM | POA: Diagnosis not present

## 2019-08-09 DIAGNOSIS — Z89421 Acquired absence of other right toe(s): Secondary | ICD-10-CM | POA: Diagnosis not present

## 2019-08-09 DIAGNOSIS — I4821 Permanent atrial fibrillation: Secondary | ICD-10-CM | POA: Diagnosis not present

## 2019-08-09 DIAGNOSIS — I10 Essential (primary) hypertension: Secondary | ICD-10-CM | POA: Diagnosis not present

## 2019-08-09 DIAGNOSIS — R52 Pain, unspecified: Secondary | ICD-10-CM | POA: Diagnosis not present

## 2019-08-09 DIAGNOSIS — R0989 Other specified symptoms and signs involving the circulatory and respiratory systems: Secondary | ICD-10-CM | POA: Diagnosis not present

## 2019-08-09 DIAGNOSIS — R279 Unspecified lack of coordination: Secondary | ICD-10-CM | POA: Diagnosis not present

## 2019-08-09 LAB — BASIC METABOLIC PANEL
Anion gap: 8 (ref 5–15)
BUN: 18 mg/dL (ref 8–23)
CO2: 31 mmol/L (ref 22–32)
Calcium: 8.9 mg/dL (ref 8.9–10.3)
Chloride: 98 mmol/L (ref 98–111)
Creatinine, Ser: 1.03 mg/dL (ref 0.61–1.24)
GFR calc Af Amer: >60 mL/min (ref >60)
GFR calc non Af Amer: >60 mL/min (ref >60)
Glucose, Bld: 140 mg/dL — ABNORMAL HIGH (ref 70–99)
Potassium: 4 mmol/L (ref 3.5–5.1)
Sodium: 137 mmol/L (ref 135–145)

## 2019-08-09 LAB — CBC
HCT: 30.2 % — ABNORMAL LOW (ref 39.0–52.0)
Hemoglobin: 9.8 g/dL — ABNORMAL LOW (ref 13.0–17.0)
MCH: 30.1 pg (ref 26.0–34.0)
MCHC: 32.5 g/dL (ref 30.0–36.0)
MCV: 92.6 fL (ref 80.0–100.0)
Platelets: 202 10*3/uL (ref 150–400)
RBC: 3.26 MIL/uL — ABNORMAL LOW (ref 4.22–5.81)
RDW: 16.1 % — ABNORMAL HIGH (ref 11.5–15.5)
WBC: 4.6 10*3/uL (ref 4.0–10.5)
nRBC: 0 % (ref 0.0–0.2)

## 2019-08-09 LAB — GLUCOSE, CAPILLARY
Glucose-Capillary: 167 mg/dL — ABNORMAL HIGH (ref 70–99)
Glucose-Capillary: 170 mg/dL — ABNORMAL HIGH (ref 70–99)

## 2019-08-09 LAB — MAGNESIUM: Magnesium: 1.6 mg/dL — ABNORMAL LOW (ref 1.7–2.4)

## 2019-08-09 MED ORDER — MAGNESIUM OXIDE 400 (241.3 MG) MG PO TABS: 400.0000 mg | ORAL_TABLET | Freq: Two times a day (BID) | ORAL | 0 refills | Status: DC

## 2019-08-09 MED ORDER — MAGNESIUM SULFATE 2 GM/50ML IV SOLN
2.0000 g | Freq: Once | INTRAVENOUS | Status: DC
  Filled 2019-08-09: qty 50

## 2019-08-09 MED ORDER — MAGNESIUM OXIDE 400 (241.3 MG) MG PO TABS
800.0000 mg | ORAL_TABLET | Freq: Once | ORAL | Status: AC
  Administered 2019-08-09: 800 mg via ORAL

## 2019-08-09 NOTE — Progress Notes (Signed)
Report given to receiving nurse Charlene at [facility].

## 2019-08-09 NOTE — Progress Notes (Signed)
Pt given discharge instructions, prescriptions, and care notes. Pt verbalized understanding AEB no further questions or concerns at this time. Telemetry discontinued and Centralized Telemetry was notified. Pt left the floor via stretcher with PTAR in stable condition.

## 2019-08-09 NOTE — Discharge Summary (Signed)
Physician Discharge Summary  Charles [name] [id number] DOB: 23-Jan-1941 DOA: 08/03/2019  PCP: Cassandria Anger, MD  Admit date: 08/03/2019 Discharge date: 08/09/2019  Admitted From: Home Disposition: SNF  Recommendations for Outpatient Follow-up:  1. Follow up with PCP in 1-2 weeks 2. Please obtain CBC/BMP/Mag at follow up 3. Please follow up on the following pending results: None  Home Health: Not applicable Equipment/Devices: Not applicable  Discharge Condition: Stable CODE STATUS: Full code  Hospital Course: As per HPI: 78 y.o.male,w hypertension, hyperlipidemia, DM2,Diabetic neuropathy, w/ history of transmetatarsal amputation of the right foot, and also B12 deficiency, CAD , chronic diastolic CHF, P afib,OSA on CPAP, Copd presents with c/o overdose on lasix, took 30 pills by mistake per patient. He reports he took wrong bottle of pills instead of his regular meds and took them all at once. He also notes redness of the right distal lower ext for the past 1 week.   In ED, vitals are stable. Labs with fairly stable sodium-potassium electrolytes, with hypomagnesemia COVID-19 negative. Pt given magnesium sulfate 2gm iv x1, and also Kcl 60 meq po x1,  Poison control per ED recommended monitoring his renal function as well as potassium over night.    Patient diuresed well and felt better from cardiopulmonary standpoint.  Electrolytes closely monitored and replenished.  Renal function remained stable.  He was also started on doxycycline for lower extremity cellulitis with improvement.   Evaluated by PT/OT who recommended SNF prior to returning home.  Patient was reportedly ambulatory with walker at baseline.  See individual problem list below for more on hospital course.  Discharge Diagnoses:  Unintentional overdose with Lasix:   -Poison control consulted on admission. -Had about 11 L urine output since admission.  Only 800 cc recorded over the last 24. -Renal  function and electrolytes stable.  Chronic diastolic CHF: No cardiopulmonary complaints this morning. -About 11 L since admission but only 800 cc over the last 24 hours per chart. -Respiratory status improved tremendously. -Replaced Actos with Tradjenta. -Change Lasix to torsemide for better diuresis. -Daily weight, intake output -Check renal function and magnesium level in about a week. -Salt and fluid restriction.  History of CAD: No anginal symptoms. -Continue home metoprolol, Ranexa, Imdur, statin, Imdur -Changing Plavix to Eliquis given history of A. fib.  Essential hypertension: Normotensive. -Cardiac meds as above.  Controlled IDDM-2 with neuropathic foot ulceration: CBG within fair range.  A1c 7.4 in 6/20. -Discharged on home medications -Change Actos to Tradjenta in the setting of CHF/fluid overload.  Permanent A. Fib: Rate controlled.  On metoprolol for rate control.  Not on anticoagulation. -Discharged on home metoprolol. -Started Eliquis.  Stopped Plavix.  Patient has no history of GI bleed.  History of CVA: Stable.  No focal neuro deficits. -Continue home statin -Stop Cardizem aspirin.  Started Eliquis in the setting of A. fib.  Chronic COPD: Stable.  No respiratory complaint today. -Discharged on home Symbicort and PRN breathing treatments.  Right lower extremity cellulitis in patient with venous insufficiency: Improving.  See picture. -Discharged on doxycycline for 10 more days  Chronic urinary incontinence -Discharged on home Detrol.  OSA on CPAP -Nightly CPAP.  Morbid obesity: BMI 37.17. -Encourage lifestyle change to lose weight.  Hypokalemia/hypomagnesemia: Likely to Lasix overdose.  Replenished and resolved.  Pressure Injury POA: NA Measurement:0.5cm x 0.5cm x 0cm Wound bed:100% dark black, calloused Drainage (amount, consistency, odor)none Periwound:intact -Recommend daily dressing.  Allergic conjunctivitis:  -Zaditor  eyedrop as needed  Discharge Instructions  Discharge Instructions    Diet - low sodium heart healthy   Complete by: As directed    Diet Carb Modified   Complete by: As directed    Increase activity slowly   Complete by: As directed      Allergies as of 08/09/2019      Reactions   Lipitor [atorvastatin]    achy   Xarelto [rivaroxaban] Rash      Medication List    STOP taking these medications   aspirin EC 81 MG tablet   clopidogrel 75 MG tablet Commonly known as: PLAVIX   furosemide 80 MG tablet Commonly known as: LASIX   pioglitazone 45 MG tablet Commonly known as: ACTOS     TAKE these medications   acetaminophen 500 MG tablet Commonly known as: TYLENOL Take 1,000 mg by mouth at bedtime.   albuterol 108 (90 Base) MCG/ACT inhaler Commonly known as: VENTOLIN HFA Inhale 1-2 puffs into the lungs every 6 (six) hours as needed for wheezing or shortness of breath.   apixaban 5 MG Tabs tablet Commonly known as: ELIQUIS Take 1 tablet (5 mg total) by mouth 2 (two) times daily.   cholecalciferol 1000 units tablet Commonly known as: VITAMIN D Take 1,000 Units by mouth daily.   collagenase ointment Commonly known as: Santyl Apply 1 application topically daily.   doxycycline 100 MG tablet Commonly known as: VIBRA-TABS Take 1 tablet (100 mg total) by mouth every 12 (twelve) hours.   Fluticasone-Salmeterol 100-50 MCG/DOSE Aepb Commonly known as: Advair Diskus Inhale 1 puff into the lungs 2 (two) times daily.   gabapentin 300 MG capsule Commonly known as: NEURONTIN TAKE 1 CAPSULE THREE TIMES DAILY What changed:   how much to take  how to take this  when to take this  additional instructions   insulin aspart 100 UNIT/ML FlexPen Commonly known as: NOVOLOG Inject 20 Units into the skin 4 (four) times daily - after meals and at bedtime. Follow SS What changed:   how much to take  when to take this  additional instructions   isosorbide mononitrate 60  MG 24 hr tablet Commonly known as: IMDUR TAKE 1 TABLET EVERY DAY   linagliptin 5 MG Tabs tablet Commonly known as: TRADJENTA Take 1 tablet (5 mg total) by mouth daily.   magnesium oxide 400 (241.3 Mg) MG tablet Commonly known as: MAG-OX Take 1 tablet (400 mg total) by mouth 2 (two) times daily.   metFORMIN 500 MG tablet Commonly known as: GLUCOPHAGE TAKE 1 TABLET TWICE DAILY WITH A MEAL What changed: See the new instructions.   metoprolol tartrate 25 MG tablet Commonly known as: LOPRESSOR Take 1 tablet (25 mg total) by mouth 2 (two) times daily.   nitroGLYCERIN 0.4 MG SL tablet Commonly known as: NITROSTAT DISSOLVE 1 TABLET (0.4 MG TOTAL) UNDER THE TONGUE EVERY 5 (FIVE) MINUTES AS NEEDED (UP TO 3 DOSES) FOR CHEST PAIN What changed: See the new instructions.   pravastatin 40 MG tablet Commonly known as: PRAVACHOL TAKE 1 TABLET EVERY DAY   ranolazine 500 MG 12 hr tablet Commonly known as: RANEXA TAKE 1 TABLET TWICE DAILY   tolterodine 4 MG 24 hr capsule Commonly known as: Detrol LA Take 1 capsule (4 mg total) by mouth daily.   torsemide 20 MG tablet Commonly known as: DEMADEX Take 2 tablets (40 mg total) by mouth 2 (two) times daily.   triamcinolone ointment 0.5 % Commonly known as: KENALOG Apply 1 application topically 2 (two) times daily.   vitamin  B-12 1000 MCG tablet Commonly known as: CYANOCOBALAMIN Take 1,000 mcg by mouth daily.      Contact information for after-discharge care    Destination    [facility] Preferred SNF .   Service: Skilled Nursing Contact information: [address] [place] [phone]              Consultations:  Poison control  Procedures/Studies:  2D Echo: None  No results found.   Subjective: No major events overnight of this morning.  No complaints.  Denies chest pain, dyspnea, GI or GU symptoms.  Expresses appreciation about his care.   Discharge Exam: Vitals:    08/09/19 0624 08/09/19 0731  BP: 107/61   Pulse: (!) 109 91  Resp: 16 16  Temp: 98.6 F (37 C)   SpO2: 94% 92%    GENERAL: No acute distress.  Lying in bed. HEENT: MMM.  Vision and hearing grossly intact.  NECK: Supple.  No apparent JVD.  LUNGS:  No IWOB.  Fair aeration bilaterally but limited exam due to body habitus.Marland Kitchen HEART: IR.  Normal rate. Heart sounds normal.  ABD: Bowel sounds present. Soft. Non tender.  MSK/EXT:  Moves all extremities.  Venous insufficiency bilaterally.  Mild erythema but no increased warmth to touch.  Bilateral transmetatarsal amputations.  SKIN: Venous stasis bilaterally. NEURO: Awake, alert and oriented appropriately.  No gross deficit.  PSYCH: Calm. Normal affect.   The results of significant diagnostics from this hospitalization (including imaging, microbiology, ancillary and laboratory) are listed below for reference.     Microbiology: Recent Results (from the past 240 hour(s))  SARS CORONAVIRUS 2 Nasal Swab Aptima Multi Swab     Status: None   Collection Time: 08/03/19  4:47 PM   Specimen: Aptima Multi Swab; Nasal Swab  Result Value Ref Range Status   SARS Coronavirus 2 NEGATIVE NEGATIVE Final    Comment: (NOTE) SARS-CoV-2 target nucleic acids are NOT DETECTED. The SARS-CoV-2 RNA is generally detectable in upper and lower respiratory specimens during the acute phase of infection. Negative results do not preclude SARS-CoV-2 infection, do not rule out co-infections with other pathogens, and should not be used as the sole basis for treatment or other patient management decisions. Negative results must be combined with clinical observations, patient history, and epidemiological information. The expected result is Negative. Fact Sheet for Patients: SugarRoll.be Fact Sheet for Healthcare Providers: https://www.woods-mathews.com/ This test is not yet approved or cleared by the [place] FDA and  has been  authorized for detection and/or diagnosis of SARS-CoV-2 by FDA under an Emergency Use Authorization (EUA). This EUA will remain  in effect (meaning this test can be used) for the duration of the COVID-19 declaration under Section 56 4(b)(1) of the Act, 21 U.S.C. section 360bbb-3(b)(1), unless the authorization is terminated or revoked sooner. Performed at [facility], [place] [address], [place], [place] [address]   MRSA PCR Screening     Status: None   Collection Time: 08/04/19  2:50 AM   Specimen: Nasal Mucosa; Nasopharyngeal  Result Value Ref Range Status   MRSA by PCR NEGATIVE NEGATIVE Final    Comment:        The GeneXpert MRSA Assay (FDA approved for NASAL specimens only), is one component of a comprehensive MRSA colonization surveillance program. It is not intended to diagnose MRSA infection nor to guide or monitor treatment for MRSA infections. Performed at [facility], [place] [address] [place], [place] [address]   Novel  Coronavirus, NAA (hospital order; send-out to ref lab)     Status: None   Collection Time: 08/07/19  4:30 PM   Specimen: Nasopharyngeal Swab; Respiratory  Result Value Ref Range Status   SARS-CoV-2, NAA NOT DETECTED NOT DETECTED Final    Comment: (NOTE) This test was developed and its performance characteristics determined by [employer]. This test has not been FDA cleared or approved. This test has been authorized by FDA under an Emergency Use Authorization (EUA). This test is only authorized for the duration of time the declaration that circumstances exist justifying the authorization of the emergency use of in vitro diagnostic tests for detection of SARS-CoV-2 virus and/or diagnosis of COVID-19 infection under section 564(b)(1) of the Act, 21 U.S.C. [id number]), unless the authorization is terminated or revoked sooner. When diagnostic testing is negative, the possibility of a false negative result should be considered in the  context of a patient's recent exposures and the presence of clinical signs and symptoms consistent with COVID-19. An individual without symptoms of COVID-19 and who is not shedding SARS-CoV-2 virus would expect to have a negative (not detected) result in this assay. Performed  At: [facility] [name] [place], [place] [id number] Rush Farmer MD [id number]    [place]  Final    Comment: Performed at [facility], [place] [address] [place], [place] [address]     Labs: BNP (last 3 results) No results for input(s): BNP in the last 8760 hours. Basic Metabolic Panel: Recent Labs  Lab 08/04/19 1437 08/05/19 0342 08/06/19 0307 08/06/19 0903 08/07/19 0305 08/08/19 0238 08/09/19 0226  NA 141 139 140  --  138 138 137  K 3.7 3.2* 3.6  --  3.7 4.0 4.0  CL 100 98 102  --  101 101 98  CO2 30 29 30   --  28 29 31   GLUCOSE 172* 115* 156*  --  148* 136* 140*  BUN 19 19 17   --  16 16 18   CREATININE 1.09 1.09 1.20  --  1.08 0.99 1.03  CALCIUM 8.9 8.8* 8.7*  --  8.8* 8.8* 8.9  MG 1.4*  --   --  1.5* 1.7 1.7 1.6*  PHOS  --   --   --   --  2.9  --   --    Liver Function Tests: Recent Labs  Lab 08/03/19 1647 08/04/19 0623  AST 22 21  ALT 12 10  ALKPHOS 60 60  BILITOT 1.1 1.2  PROT 6.8 6.6  ALBUMIN 3.5 3.4*   No results for input(s): LIPASE, AMYLASE in the last 168 hours. No results for input(s): AMMONIA in the last 168 hours. CBC: Recent Labs  Lab 08/03/19 1647 08/04/19 0623 08/09/19 0226  WBC 5.3 4.9 4.6  NEUTROABS 4.3  --   --   HGB 11.2* 10.8* 9.8*  HCT 36.3* 33.6* 30.2*  MCV 96.0 93.6 92.6  PLT 193 189 202   Cardiac Enzymes: No results for input(s): CKTOTAL, CKMB, CKMBINDEX, TROPONINI in the last 168 hours. BNP: Invalid input(s): POCBNP CBG: Recent Labs  Lab 08/08/19 1155 08/08/19 1634 08/08/19 2336 08/08/19 2344 08/09/19 0807  GLUCAP 128* 116* 155* 147* 167*   D-Dimer No results for input(s): DDIMER in the  last 72 hours. Hgb A1c No results for input(s): HGBA1C in the last 72 hours. Lipid Profile No results for input(s): CHOL, HDL, LDLCALC, TRIG, CHOLHDL, LDLDIRECT in the last 72 hours. Thyroid function studies No results for input(s): TSH, T4TOTAL, T3FREE,  THYROIDAB in the last 72 hours.  Invalid input(s): FREET3 Anemia work up No results for input(s): VITAMINB12, FOLATE, FERRITIN, TIBC, IRON, RETICCTPCT in the last 72 hours. Urinalysis    Component Value Date/Time   COLORURINE YELLOW 05/25/2019 1353   APPEARANCEUR CLEAR 05/25/2019 1353   LABSPEC 1.020 05/25/2019 1353   PHURINE 6.0 05/25/2019 1353   GLUCOSEU NEGATIVE 05/25/2019 1353   HGBUR NEGATIVE 05/25/2019 1353   BILIRUBINUR NEGATIVE 05/25/2019 1353   KETONESUR NEGATIVE 05/25/2019 1353   PROTEINUR NEGATIVE 01/10/2017 1639   UROBILINOGEN 1.0 05/25/2019 1353   NITRITE NEGATIVE 05/25/2019 1353   LEUKOCYTESUR SMALL (A) 05/25/2019 1353   Sepsis Labs Invalid input(s): PROCALCITONIN,  WBC,  LACTICIDVEN   Time coordinating discharge: 40 minutes  SIGNED:  Mercy Riding, MD  [facility] 08/09/2019, 11:17 AM  If 7PM-7AM, please contact night-coverage [email or web address] Password [email or web address]

## 2019-08-09 NOTE — TOC Transition Note (Signed)
Transition of Care [facility]) - CM/SW Discharge Note   Patient Details  Name: Charles [name] MRN: [id number] Date of Birth: [birth year]  [facility] CM/SW Contact:  Sharin Mons, RN Phone Number: 08/09/2019, 10:57 AM   Clinical Narrative:     Patient will DC to: [facility] Anticipated DC date: 08/09/2019 Family notified: Joaquim Lai ( wife) Transport by: Corey Harold   Per MD patient ready for DC to [facility]. RN, patient, patient's family, and facility notified of DC. Discharge Summary and FL2 sent to facility. RN to call report prior to discharge [phone]). Rm # [id number] packet on chart. Ambulance transport requested for patient for 12:30 pm. Wife to bring CPAP machine from home, [facility] liaison made aware. Whitman Hero RN,BSN,CM [phone]    RNCM will sign off for now as intervention is no longer needed. Please consult [place] again if new needs arise.   Final next level of care: Skilled Nursing Facility Barriers to Discharge: No Barriers Identified   Patient Goals and CMS Choice Patient states their goals for this hospitalization and ongoing recovery are:: to get better CMS Medicare.gov Compare Post Acute Care list provided to:: Patient Choice offered to / list presented to : Patient, Spouse  Discharge Placement              Patient chooses bed at: [facility] Patient to be transferred to facility by: [facility] Name of family member notified: Joaquim Lai ( wife) Patient and family notified of of transfer: 08/09/19  Discharge Plan and Services   Discharge Planning Services: CM Consult                                 Social Determinants of Health (SDOH) Interventions     Readmission Risk Interventions No flowsheet data found.

## 2019-08-10 ENCOUNTER — Telehealth: Payer: Self-pay | Admitting: *Deleted

## 2019-08-10 NOTE — Telephone Encounter (Signed)
Patient discharged from hospital to SNF on 08/09/19. Instructions state for patient to f/u with PCP with 1 to 2 weeks. Will f/u with patient once he is released from SNF to home.

## 2019-08-14 DIAGNOSIS — J449 Chronic obstructive pulmonary disease, unspecified: Secondary | ICD-10-CM | POA: Diagnosis not present

## 2019-08-14 DIAGNOSIS — R5381 Other malaise: Secondary | ICD-10-CM | POA: Diagnosis not present

## 2019-08-14 DIAGNOSIS — I6930 Unspecified sequelae of cerebral infarction: Secondary | ICD-10-CM | POA: Diagnosis not present

## 2019-08-14 DIAGNOSIS — R451 Restlessness and agitation: Secondary | ICD-10-CM | POA: Diagnosis not present

## 2019-08-14 DIAGNOSIS — R1312 Dysphagia, oropharyngeal phase: Secondary | ICD-10-CM | POA: Diagnosis not present

## 2019-08-14 DIAGNOSIS — F419 Anxiety disorder, unspecified: Secondary | ICD-10-CM | POA: Diagnosis not present

## 2019-08-14 DIAGNOSIS — R0789 Other chest pain: Secondary | ICD-10-CM | POA: Diagnosis not present

## 2019-08-14 DIAGNOSIS — J189 Pneumonia, unspecified organism: Secondary | ICD-10-CM | POA: Diagnosis not present

## 2019-08-14 DIAGNOSIS — R0989 Other specified symptoms and signs involving the circulatory and respiratory systems: Secondary | ICD-10-CM | POA: Diagnosis not present

## 2019-08-14 DIAGNOSIS — R262 Difficulty in walking, not elsewhere classified: Secondary | ICD-10-CM | POA: Diagnosis not present

## 2019-08-14 DIAGNOSIS — M6281 Muscle weakness (generalized): Secondary | ICD-10-CM | POA: Diagnosis not present

## 2019-08-14 DIAGNOSIS — R0782 Intercostal pain: Secondary | ICD-10-CM | POA: Diagnosis not present

## 2019-08-14 DIAGNOSIS — I5032 Chronic diastolic (congestive) heart failure: Secondary | ICD-10-CM | POA: Diagnosis not present

## 2019-08-15 DIAGNOSIS — R0989 Other specified symptoms and signs involving the circulatory and respiratory systems: Secondary | ICD-10-CM | POA: Diagnosis not present

## 2019-08-15 DIAGNOSIS — J449 Chronic obstructive pulmonary disease, unspecified: Secondary | ICD-10-CM | POA: Diagnosis not present

## 2019-08-15 DIAGNOSIS — F419 Anxiety disorder, unspecified: Secondary | ICD-10-CM | POA: Diagnosis not present

## 2019-08-15 DIAGNOSIS — R451 Restlessness and agitation: Secondary | ICD-10-CM | POA: Diagnosis not present

## 2019-08-15 DIAGNOSIS — J189 Pneumonia, unspecified organism: Secondary | ICD-10-CM | POA: Diagnosis not present

## 2019-08-15 DIAGNOSIS — R0782 Intercostal pain: Secondary | ICD-10-CM | POA: Diagnosis not present

## 2019-08-15 DIAGNOSIS — I6930 Unspecified sequelae of cerebral infarction: Secondary | ICD-10-CM | POA: Diagnosis not present

## 2019-08-15 DIAGNOSIS — R1312 Dysphagia, oropharyngeal phase: Secondary | ICD-10-CM | POA: Diagnosis not present

## 2019-08-15 DIAGNOSIS — I5032 Chronic diastolic (congestive) heart failure: Secondary | ICD-10-CM | POA: Diagnosis not present

## 2019-08-16 DIAGNOSIS — J449 Chronic obstructive pulmonary disease, unspecified: Secondary | ICD-10-CM | POA: Diagnosis not present

## 2019-08-16 DIAGNOSIS — L03115 Cellulitis of right lower limb: Secondary | ICD-10-CM | POA: Diagnosis not present

## 2019-08-16 DIAGNOSIS — I2510 Atherosclerotic heart disease of native coronary artery without angina pectoris: Secondary | ICD-10-CM | POA: Diagnosis not present

## 2019-08-16 DIAGNOSIS — I4891 Unspecified atrial fibrillation: Secondary | ICD-10-CM | POA: Diagnosis not present

## 2019-08-16 DIAGNOSIS — I5032 Chronic diastolic (congestive) heart failure: Secondary | ICD-10-CM | POA: Diagnosis not present

## 2019-08-16 DIAGNOSIS — E1140 Type 2 diabetes mellitus with diabetic neuropathy, unspecified: Secondary | ICD-10-CM | POA: Diagnosis not present

## 2019-08-16 DIAGNOSIS — M6281 Muscle weakness (generalized): Secondary | ICD-10-CM | POA: Diagnosis not present

## 2019-08-18 DIAGNOSIS — Z6835 Body mass index (BMI) 35.0-35.9, adult: Secondary | ICD-10-CM | POA: Diagnosis not present

## 2019-08-18 DIAGNOSIS — I110 Hypertensive heart disease with heart failure: Secondary | ICD-10-CM | POA: Diagnosis not present

## 2019-08-18 DIAGNOSIS — G4733 Obstructive sleep apnea (adult) (pediatric): Secondary | ICD-10-CM | POA: Diagnosis not present

## 2019-08-18 DIAGNOSIS — H548 Legal blindness, as defined in USA: Secondary | ICD-10-CM | POA: Diagnosis not present

## 2019-08-18 DIAGNOSIS — I872 Venous insufficiency (chronic) (peripheral): Secondary | ICD-10-CM | POA: Diagnosis not present

## 2019-08-18 DIAGNOSIS — I69320 Aphasia following cerebral infarction: Secondary | ICD-10-CM | POA: Diagnosis not present

## 2019-08-18 DIAGNOSIS — E11319 Type 2 diabetes mellitus with unspecified diabetic retinopathy without macular edema: Secondary | ICD-10-CM | POA: Diagnosis not present

## 2019-08-18 DIAGNOSIS — H9190 Unspecified hearing loss, unspecified ear: Secondary | ICD-10-CM | POA: Diagnosis not present

## 2019-08-18 DIAGNOSIS — L97521 Non-pressure chronic ulcer of other part of left foot limited to breakdown of skin: Secondary | ICD-10-CM | POA: Diagnosis not present

## 2019-08-18 DIAGNOSIS — J440 Chronic obstructive pulmonary disease with acute lower respiratory infection: Secondary | ICD-10-CM | POA: Diagnosis not present

## 2019-08-18 DIAGNOSIS — I4821 Permanent atrial fibrillation: Secondary | ICD-10-CM | POA: Diagnosis not present

## 2019-08-18 DIAGNOSIS — E785 Hyperlipidemia, unspecified: Secondary | ICD-10-CM | POA: Diagnosis not present

## 2019-08-18 DIAGNOSIS — D519 Vitamin B12 deficiency anemia, unspecified: Secondary | ICD-10-CM | POA: Diagnosis not present

## 2019-08-18 DIAGNOSIS — E11621 Type 2 diabetes mellitus with foot ulcer: Secondary | ICD-10-CM | POA: Diagnosis not present

## 2019-08-18 DIAGNOSIS — N529 Male erectile dysfunction, unspecified: Secondary | ICD-10-CM | POA: Diagnosis not present

## 2019-08-18 DIAGNOSIS — J189 Pneumonia, unspecified organism: Secondary | ICD-10-CM | POA: Diagnosis not present

## 2019-08-18 DIAGNOSIS — R32 Unspecified urinary incontinence: Secondary | ICD-10-CM | POA: Diagnosis not present

## 2019-08-18 DIAGNOSIS — E1140 Type 2 diabetes mellitus with diabetic neuropathy, unspecified: Secondary | ICD-10-CM | POA: Diagnosis not present

## 2019-08-18 DIAGNOSIS — E1151 Type 2 diabetes mellitus with diabetic peripheral angiopathy without gangrene: Secondary | ICD-10-CM | POA: Diagnosis not present

## 2019-08-18 DIAGNOSIS — I2510 Atherosclerotic heart disease of native coronary artery without angina pectoris: Secondary | ICD-10-CM | POA: Diagnosis not present

## 2019-08-18 DIAGNOSIS — I69322 Dysarthria following cerebral infarction: Secondary | ICD-10-CM | POA: Diagnosis not present

## 2019-08-18 DIAGNOSIS — I5032 Chronic diastolic (congestive) heart failure: Secondary | ICD-10-CM | POA: Diagnosis not present

## 2019-08-18 DIAGNOSIS — L03115 Cellulitis of right lower limb: Secondary | ICD-10-CM | POA: Diagnosis not present

## 2019-08-18 DIAGNOSIS — E559 Vitamin D deficiency, unspecified: Secondary | ICD-10-CM | POA: Diagnosis not present

## 2019-08-21 ENCOUNTER — Encounter: Payer: Self-pay | Admitting: Internal Medicine

## 2019-08-21 ENCOUNTER — Ambulatory Visit (INDEPENDENT_AMBULATORY_CARE_PROVIDER_SITE_OTHER): Payer: Medicare Other | Admitting: Internal Medicine

## 2019-08-21 ENCOUNTER — Other Ambulatory Visit: Payer: Self-pay

## 2019-08-21 ENCOUNTER — Telehealth: Payer: Self-pay | Admitting: Internal Medicine

## 2019-08-21 VITALS — BP 112/66 | HR 71 | Temp 98.2°F | Ht 76.0 in | Wt 293.0 lb

## 2019-08-21 DIAGNOSIS — I110 Hypertensive heart disease with heart failure: Secondary | ICD-10-CM | POA: Diagnosis not present

## 2019-08-21 DIAGNOSIS — E559 Vitamin D deficiency, unspecified: Secondary | ICD-10-CM

## 2019-08-21 DIAGNOSIS — E11621 Type 2 diabetes mellitus with foot ulcer: Secondary | ICD-10-CM | POA: Diagnosis not present

## 2019-08-21 DIAGNOSIS — I480 Paroxysmal atrial fibrillation: Secondary | ICD-10-CM | POA: Diagnosis not present

## 2019-08-21 DIAGNOSIS — Z23 Encounter for immunization: Secondary | ICD-10-CM | POA: Diagnosis not present

## 2019-08-21 DIAGNOSIS — R600 Localized edema: Secondary | ICD-10-CM

## 2019-08-21 DIAGNOSIS — E1151 Type 2 diabetes mellitus with diabetic peripheral angiopathy without gangrene: Secondary | ICD-10-CM | POA: Diagnosis not present

## 2019-08-21 DIAGNOSIS — I2510 Atherosclerotic heart disease of native coronary artery without angina pectoris: Secondary | ICD-10-CM | POA: Diagnosis not present

## 2019-08-21 DIAGNOSIS — E538 Deficiency of other specified B group vitamins: Secondary | ICD-10-CM | POA: Diagnosis not present

## 2019-08-21 DIAGNOSIS — E1149 Type 2 diabetes mellitus with other diabetic neurological complication: Secondary | ICD-10-CM | POA: Diagnosis not present

## 2019-08-21 DIAGNOSIS — L97521 Non-pressure chronic ulcer of other part of left foot limited to breakdown of skin: Secondary | ICD-10-CM | POA: Diagnosis not present

## 2019-08-21 DIAGNOSIS — I5032 Chronic diastolic (congestive) heart failure: Secondary | ICD-10-CM | POA: Diagnosis not present

## 2019-08-21 DIAGNOSIS — L03115 Cellulitis of right lower limb: Secondary | ICD-10-CM | POA: Diagnosis not present

## 2019-08-21 MED ORDER — TORSEMIDE 100 MG PO TABS: 100.0000 mg | ORAL_TABLET | Freq: Every day | ORAL | 3 refills | Status: DC

## 2019-08-21 NOTE — Telephone Encounter (Signed)
Pam calling from [facility] stated that she would like verbals for skilled nursing for disease management and medication management. Also states that pt has a wound on 2nd left toe that she would like to take care of. Please advise

## 2019-08-21 NOTE — Progress Notes (Signed)
Subjective:  Patient ID: Charles [name], male    DOB: [birth year]  Age: 78 y.o. MRN: [id number]  CC: No chief complaint on file.   HPI Charles [name] presents for a post hosp f/u -- Lasix overdose, DM, CRF. S/p NH stay. Back at home - on Torsemide - peeing less...   Per hx:  "Admit date: 08/03/2019 Discharge date: 08/08/2019  Admitted From: Home Disposition: SNF  Recommendations for Outpatient Follow-up:  1. Follow up with PCP in 1-2 weeks 2. Please obtain CBC/BMP/Mag at follow up 3. Please follow up on the following pending results: None  Home Health: Not applicable Equipment/Devices: Not applicable  Discharge Condition: Stable CODE STATUS: Full code  Hospital Course: As per HPI: 78 y.o.male,w hypertension, hyperlipidemia, DM2,Diabetic neuropathy, w/ history of transmetatarsal amputation of the right foot, and also B12 deficiency, CAD , chronic diastolic CHF, P afib,OSA on CPAP, Copd presents with c/o overdose on lasix, took 30 pills by mistake per patient. He reports he took wrong bottle of pills instead of his regular meds and took them all at once. He also notes redness of the right distal lower ext for the past 1 week.   In ED, vitals are stable. Labs with fairly stable sodium-potassium electrolytes, with hypomagnesemia COVID-19 negative. Pt given magnesium sulfate 2gm iv x1, and also Kcl 60 meq po x1,  Poison control per ED recommended monitoring his renal function as well as potassium over night.  Patient diuresed well and felt better from cardiopulmonary standpoint.  Electrolytes closely monitored and replenished.  Renal function remained stable.  He was also started on doxycycline for lower extremity cellulitis with improvement.   Evaluated by PT/OT who recommended SNF prior to returning home.  Patient was reportedly ambulatory with walker at baseline.  See individual problem list below for more on hospital course.  Discharge Diagnoses:   Unintentional overdose with Lasix:  -Poison control consulted on admission. -Had about 11 L urine output since admission.Only 800 cc recorded over the last 24. -Renal function and electrolytes stable.  Chronic diastolic CHF: No cardiopulmonary complaints this morning. -About11 L since admission butonly 800 cc over the last 24 hours per chart. -Respiratory status improved tremendously. -Replaced Actos with Tradjenta. -Change Lasix to torsemide for better diuresis. -Daily weight, intake output -Check renal function and magnesium level in about a week. -Salt and fluid restriction.  History of CAD: No anginal symptoms. -Continue home metoprolol, Ranexa, Imdur, statin, Imdur -Changing Plavix to Eliquis given history of A. fib.  Essential hypertension: Normotensive. -Cardiac meds as above.  Controlled IDDM-2 with neuropathic foot ulceration: CBG within fair range. A1c 7.4 in 6/20. -Discharged on home medications -Change Actos to Tradjenta in the setting of CHF/fluid overload.  Permanent A. Fib: Rate controlled. On metoprolol for rate control. Not on anticoagulation. -Discharged on home metoprolol. -Started Eliquis.  Stopped Plavix.  Patient has no history of GI bleed.  History of CVA: Stable. No focal neuro deficits. -Continue home statin -Stop Cardizem aspirin.  Started Eliquis in the setting of A. fib.  Chronic COPD: Stable. No respiratory complaint today. -Discharged on home Symbicort and PRN breathing treatments.  Right lower extremity cellulitis in patient with venous insufficiency: Improving. See picture. -Discharged on doxycycline for 10 more days  Chronic urinary incontinence -Discharged on home Detrol.  OSA on CPAP -Nightly CPAP.  Morbid obesity: BMI 37.17. -Encourage lifestyle change to lose weight.  Hypokalemia/hypomagnesemia: Likely to Lasix overdose.  Replenished"   Outpatient Medications Prior to Visit  Medication Sig  Dispense Refill   . acetaminophen (TYLENOL) 500 MG tablet Take 1,000 mg by mouth at bedtime.     Marland Kitchen albuterol (VENTOLIN HFA) 108 (90 Base) MCG/ACT inhaler Inhale 1-2 puffs into the lungs every 6 (six) hours as needed for wheezing or shortness of breath.    Marland Kitchen apixaban (ELIQUIS) 5 MG TABS tablet Take 1 tablet (5 mg total) by mouth 2 (two) times daily. 60 tablet 0  . cholecalciferol (VITAMIN D) 1000 units tablet Take 1,000 Units by mouth daily.     . collagenase (SANTYL) ointment Apply 1 application topically daily. 15 g 3  . doxycycline (VIBRA-TABS) 100 MG tablet Take 1 tablet (100 mg total) by mouth every 12 (twelve) hours. 20 tablet 0  . Fluticasone-Salmeterol (ADVAIR DISKUS) 100-50 MCG/DOSE AEPB Inhale 1 puff into the lungs 2 (two) times daily. 3 each 3  . gabapentin (NEURONTIN) 300 MG capsule TAKE 1 CAPSULE THREE TIMES DAILY (Patient taking differently: Take 300 mg by mouth 2 (two) times daily. ) 270 capsule 3  . insulin aspart (NOVOLOG) 100 UNIT/ML FlexPen Inject 20 Units into the skin 4 (four) times daily - after meals and at bedtime. Follow SS (Patient taking differently: Inject 0-20 Units into the skin See admin instructions. Inject #units after meals and at bedtime depending on sliding scale) 15 pen 3  . isosorbide mononitrate (IMDUR) 60 MG 24 hr tablet TAKE 1 TABLET EVERY DAY (Patient taking differently: Take 60 mg by mouth daily. ) 90 tablet 3  . linagliptin (TRADJENTA) 5 MG TABS tablet Take 1 tablet (5 mg total) by mouth daily. 90 tablet 0  . magnesium oxide (MAG-OX) 400 (241.3 Mg) MG tablet Take 1 tablet (400 mg total) by mouth 2 (two) times daily. 60 tablet 0  . metFORMIN (GLUCOPHAGE) 500 MG tablet TAKE 1 TABLET TWICE DAILY WITH A MEAL (Patient taking differently: Take 500 mg by mouth 2 (two) times daily with a meal. ) 180 tablet 3  . metoprolol tartrate (LOPRESSOR) 25 MG tablet Take 1 tablet (25 mg total) by mouth 2 (two) times daily. 180 tablet 3  . nitroGLYCERIN (NITROSTAT) 0.4 MG SL tablet DISSOLVE 1  TABLET (0.4 MG TOTAL) UNDER THE TONGUE EVERY 5 (FIVE) MINUTES AS NEEDED (UP TO 3 DOSES) FOR CHEST PAIN (Patient taking differently: Place 0.4 mg under the tongue every 5 (five) minutes as needed for chest pain. ) 50 tablet 1  . pravastatin (PRAVACHOL) 40 MG tablet TAKE 1 TABLET EVERY DAY (Patient taking differently: Take 40 mg by mouth daily. ) 90 tablet 3  . ranolazine (RANEXA) 500 MG 12 hr tablet TAKE 1 TABLET TWICE DAILY (Patient taking differently: Take 500 mg by mouth 2 (two) times daily. ) 180 tablet 3  . tolterodine (DETROL LA) 4 MG 24 hr capsule Take 1 capsule (4 mg total) by mouth daily. 30 capsule 11  . torsemide (DEMADEX) 20 MG tablet Take 2 tablets (40 mg total) by mouth 2 (two) times daily. 180 tablet 0  . triamcinolone ointment (KENALOG) 0.5 % Apply 1 application topically 2 (two) times daily. 60 g 0  . vitamin B-12 (CYANOCOBALAMIN) 1000 MCG tablet Take 1,000 mcg by mouth daily.      No facility-administered medications prior to visit.     ROS: Review of Systems  Constitutional: Positive for fatigue and unexpected weight change. Negative for appetite change.  HENT: Negative for congestion, nosebleeds, sneezing, sore throat and trouble swallowing.   Eyes: Negative for itching and visual disturbance.  Respiratory: Negative for cough and  shortness of breath.   Cardiovascular: Positive for leg swelling. Negative for chest pain and palpitations.  Gastrointestinal: Negative for abdominal distention, blood in stool, diarrhea and nausea.  Genitourinary: Negative for frequency and hematuria.  Musculoskeletal: Positive for arthralgias, back pain and gait problem. Negative for joint swelling and neck pain.  Skin: Negative for rash.  Neurological: Positive for weakness. Negative for dizziness, tremors and speech difficulty.  Psychiatric/Behavioral: Positive for decreased concentration. Negative for agitation, dysphoric mood, sleep disturbance and suicidal ideas. The patient is not  nervous/anxious.     Objective:  There were no vitals taken for this visit.  BP Readings from Last 3 Encounters:  08/09/19 107/61  05/25/19 126/72  02/21/19 124/66    Wt Readings from Last 3 Encounters:  08/09/19 296 lb 4.8 oz (134.4 kg)  05/25/19 (!) 312 lb (141.5 kg)  02/21/19 (!) 319 lb (144.7 kg)    Physical Exam Constitutional:      General: He is not in acute distress.    Appearance: He is well-developed. He is obese.     Comments: NAD  Eyes:     Conjunctiva/sclera: Conjunctivae normal.     Pupils: Pupils are equal, round, and reactive to light.  Neck:     Musculoskeletal: Normal range of motion.     Thyroid: No thyromegaly.     Vascular: No JVD.  Cardiovascular:     Rate and Rhythm: Bradycardia present. Rhythm irregular.     Heart sounds: Normal heart sounds. No murmur. No friction rub. No gallop.   Pulmonary:     Effort: Pulmonary effort is normal. No respiratory distress.     Breath sounds: Normal breath sounds. No wheezing or rales.  Chest:     Chest wall: No tenderness.  Abdominal:     General: Bowel sounds are normal. There is no distension.     Palpations: Abdomen is soft. There is no mass.     Tenderness: There is no abdominal tenderness. There is no guarding or rebound.  Musculoskeletal: Normal range of motion.        General: Swelling and tenderness present.  Lymphadenopathy:     Cervical: No cervical adenopathy.  Skin:    General: Skin is warm and dry.     Findings: No rash.  Neurological:     Mental Status: He is alert and oriented to person, place, and time.     Cranial Nerves: No cranial nerve deficit.     Motor: No abnormal muscle tone.     Coordination: Coordination normal.     Gait: Gait abnormal.     Deep Tendon Reflexes: Reflexes are normal and symmetric.  Psychiatric:        Behavior: Behavior normal.        Thought Content: Thought content normal.        Judgment: Judgment normal.   in a w/c LE wrapped distally; ulcers -  weeping; 1+ edema B  Lab Results  Component Value Date   WBC 4.6 08/09/2019   HGB 9.8 (L) 08/09/2019   HCT 30.2 (L) 08/09/2019   PLT 202 08/09/2019   GLUCOSE 140 (H) 08/09/2019   CHOL 131 06/01/2017   TRIG 31 06/01/2017   HDL 45 06/01/2017   LDLCALC 80 06/01/2017   ALT 10 08/04/2019   AST 21 08/04/2019   NA 137 08/09/2019   K 4.0 08/09/2019   CL 98 08/09/2019   CREATININE 1.03 08/09/2019   BUN 18 08/09/2019   CO2 31 08/09/2019   TSH 2.584  06/01/2017   PSA 0.96 08/28/2010   INR 1.06 06/01/2017   HGBA1C 7.2 (H) 05/25/2019   MICROALBUR 1.4 08/28/2010    No results found.  Assessment & Plan:   There are no diagnoses linked to this encounter.   No orders of the defined types were placed in this encounter.    Follow-up: No follow-ups on file.  [name], MD

## 2019-08-21 NOTE — Assessment & Plan Note (Addendum)
More permanent A fib now On Eliquis

## 2019-08-21 NOTE — Patient Instructions (Signed)
If you have medicare related insurance (such as traditional Medicare, Blue Cross Medicare, [employer] Medicare, or similar), Please make an appointment at the scheduling desk with Jill, the Wellness Health Coach, for your Wellness visit in this office, which is a benefit with your insurance.  

## 2019-08-21 NOTE — Assessment & Plan Note (Signed)
On Rx: Losartan, Lopressor, Torsemide, Pravachol, Eliquis, Isosorbide, Ranexa

## 2019-08-21 NOTE — Assessment & Plan Note (Signed)
Worse Increase Demadex to 100 mg qd or qod Labs

## 2019-08-21 NOTE — Assessment & Plan Note (Addendum)
Novolog pen Metformin  Actos - d/c Tradjenta - too $$$ - d/c

## 2019-08-22 DIAGNOSIS — L03115 Cellulitis of right lower limb: Secondary | ICD-10-CM | POA: Diagnosis not present

## 2019-08-22 DIAGNOSIS — I5032 Chronic diastolic (congestive) heart failure: Secondary | ICD-10-CM | POA: Diagnosis not present

## 2019-08-22 DIAGNOSIS — I110 Hypertensive heart disease with heart failure: Secondary | ICD-10-CM | POA: Diagnosis not present

## 2019-08-22 DIAGNOSIS — E11621 Type 2 diabetes mellitus with foot ulcer: Secondary | ICD-10-CM | POA: Diagnosis not present

## 2019-08-22 DIAGNOSIS — L97521 Non-pressure chronic ulcer of other part of left foot limited to breakdown of skin: Secondary | ICD-10-CM | POA: Diagnosis not present

## 2019-08-22 DIAGNOSIS — E1151 Type 2 diabetes mellitus with diabetic peripheral angiopathy without gangrene: Secondary | ICD-10-CM | POA: Diagnosis not present

## 2019-08-22 NOTE — Addendum Note (Signed)
Addended by: Karren Cobble on: 08/22/2019 08:44 AM   Modules accepted: Orders

## 2019-08-23 ENCOUNTER — Inpatient Hospital Stay (HOSPITAL_COMMUNITY)
Admission: EM | Admit: 2019-08-23 | Discharge: 2019-09-07 | DRG: 189 | Disposition: A | Discharge status: Skilled Nursing Facility | Payer: Medicare Other | Attending: Internal Medicine | Admitting: Internal Medicine

## 2019-08-23 ENCOUNTER — Inpatient Hospital Stay (HOSPITAL_COMMUNITY): Payer: Medicare Other

## 2019-08-23 ENCOUNTER — Emergency Department (HOSPITAL_COMMUNITY): Payer: Medicare Other

## 2019-08-23 ENCOUNTER — Other Ambulatory Visit: Payer: Self-pay

## 2019-08-23 ENCOUNTER — Encounter (HOSPITAL_COMMUNITY): Payer: Self-pay

## 2019-08-23 DIAGNOSIS — E1165 Type 2 diabetes mellitus with hyperglycemia: Secondary | ICD-10-CM | POA: Diagnosis present

## 2019-08-23 DIAGNOSIS — F05 Delirium due to known physiological condition: Secondary | ICD-10-CM | POA: Diagnosis present

## 2019-08-23 DIAGNOSIS — E1140 Type 2 diabetes mellitus with diabetic neuropathy, unspecified: Secondary | ICD-10-CM | POA: Diagnosis not present

## 2019-08-23 DIAGNOSIS — E785 Hyperlipidemia, unspecified: Secondary | ICD-10-CM | POA: Diagnosis present

## 2019-08-23 DIAGNOSIS — L03116 Cellulitis of left lower limb: Secondary | ICD-10-CM | POA: Diagnosis present

## 2019-08-23 DIAGNOSIS — E662 Morbid (severe) obesity with alveolar hypoventilation: Secondary | ICD-10-CM | POA: Diagnosis not present

## 2019-08-23 DIAGNOSIS — R531 Weakness: Secondary | ICD-10-CM | POA: Diagnosis not present

## 2019-08-23 DIAGNOSIS — E538 Deficiency of other specified B group vitamins: Secondary | ICD-10-CM | POA: Diagnosis present

## 2019-08-23 DIAGNOSIS — R310 Gross hematuria: Secondary | ICD-10-CM | POA: Diagnosis not present

## 2019-08-23 DIAGNOSIS — R42 Dizziness and giddiness: Secondary | ICD-10-CM | POA: Diagnosis not present

## 2019-08-23 DIAGNOSIS — X58XXXA Exposure to other specified factors, initial encounter: Secondary | ICD-10-CM | POA: Diagnosis present

## 2019-08-23 DIAGNOSIS — G4733 Obstructive sleep apnea (adult) (pediatric): Secondary | ICD-10-CM | POA: Diagnosis not present

## 2019-08-23 DIAGNOSIS — M2550 Pain in unspecified joint: Secondary | ICD-10-CM | POA: Diagnosis not present

## 2019-08-23 DIAGNOSIS — S42292A Other displaced fracture of upper end of left humerus, initial encounter for closed fracture: Secondary | ICD-10-CM | POA: Diagnosis present

## 2019-08-23 DIAGNOSIS — Z8673 Personal history of transient ischemic attack (TIA), and cerebral infarction without residual deficits: Secondary | ICD-10-CM | POA: Diagnosis not present

## 2019-08-23 DIAGNOSIS — I5043 Acute on chronic combined systolic (congestive) and diastolic (congestive) heart failure: Secondary | ICD-10-CM | POA: Diagnosis not present

## 2019-08-23 DIAGNOSIS — M25529 Pain in unspecified elbow: Secondary | ICD-10-CM

## 2019-08-23 DIAGNOSIS — I480 Paroxysmal atrial fibrillation: Secondary | ICD-10-CM | POA: Diagnosis not present

## 2019-08-23 DIAGNOSIS — N401 Enlarged prostate with lower urinary tract symptoms: Secondary | ICD-10-CM | POA: Diagnosis not present

## 2019-08-23 DIAGNOSIS — I110 Hypertensive heart disease with heart failure: Secondary | ICD-10-CM | POA: Diagnosis present

## 2019-08-23 DIAGNOSIS — I517 Cardiomegaly: Secondary | ICD-10-CM | POA: Diagnosis not present

## 2019-08-23 DIAGNOSIS — I4821 Permanent atrial fibrillation: Secondary | ICD-10-CM | POA: Diagnosis not present

## 2019-08-23 DIAGNOSIS — I513 Intracardiac thrombosis, not elsewhere classified: Secondary | ICD-10-CM | POA: Diagnosis present

## 2019-08-23 DIAGNOSIS — K5900 Constipation, unspecified: Secondary | ICD-10-CM | POA: Diagnosis not present

## 2019-08-23 DIAGNOSIS — I509 Heart failure, unspecified: Secondary | ICD-10-CM | POA: Diagnosis not present

## 2019-08-23 DIAGNOSIS — J810 Acute pulmonary edema: Secondary | ICD-10-CM | POA: Diagnosis not present

## 2019-08-23 DIAGNOSIS — S42202D Unspecified fracture of upper end of left humerus, subsequent encounter for fracture with routine healing: Secondary | ICD-10-CM | POA: Diagnosis not present

## 2019-08-23 DIAGNOSIS — I25119 Atherosclerotic heart disease of native coronary artery with unspecified angina pectoris: Secondary | ICD-10-CM | POA: Diagnosis not present

## 2019-08-23 DIAGNOSIS — I2781 Cor pulmonale (chronic): Secondary | ICD-10-CM

## 2019-08-23 DIAGNOSIS — R600 Localized edema: Secondary | ICD-10-CM | POA: Diagnosis not present

## 2019-08-23 DIAGNOSIS — S42214A Unspecified nondisplaced fracture of surgical neck of right humerus, initial encounter for closed fracture: Secondary | ICD-10-CM | POA: Diagnosis not present

## 2019-08-23 DIAGNOSIS — R609 Edema, unspecified: Secondary | ICD-10-CM | POA: Diagnosis not present

## 2019-08-23 DIAGNOSIS — I10 Essential (primary) hypertension: Secondary | ICD-10-CM | POA: Diagnosis not present

## 2019-08-23 DIAGNOSIS — Z79899 Other long term (current) drug therapy: Secondary | ICD-10-CM

## 2019-08-23 DIAGNOSIS — Z888 Allergy status to other drugs, medicaments and biological substances status: Secondary | ICD-10-CM

## 2019-08-23 DIAGNOSIS — E11621 Type 2 diabetes mellitus with foot ulcer: Secondary | ICD-10-CM | POA: Diagnosis not present

## 2019-08-23 DIAGNOSIS — IMO0002 Reserved for concepts with insufficient information to code with codable children: Secondary | ICD-10-CM

## 2019-08-23 DIAGNOSIS — D631 Anemia in chronic kidney disease: Secondary | ICD-10-CM | POA: Diagnosis present

## 2019-08-23 DIAGNOSIS — I872 Venous insufficiency (chronic) (peripheral): Secondary | ICD-10-CM | POA: Diagnosis not present

## 2019-08-23 DIAGNOSIS — I4819 Other persistent atrial fibrillation: Secondary | ICD-10-CM

## 2019-08-23 DIAGNOSIS — N3001 Acute cystitis with hematuria: Secondary | ICD-10-CM | POA: Diagnosis not present

## 2019-08-23 DIAGNOSIS — Z66 Do not resuscitate: Secondary | ICD-10-CM | POA: Diagnosis present

## 2019-08-23 DIAGNOSIS — I253 Aneurysm of heart: Secondary | ICD-10-CM | POA: Diagnosis present

## 2019-08-23 DIAGNOSIS — N400 Enlarged prostate without lower urinary tract symptoms: Secondary | ICD-10-CM | POA: Diagnosis present

## 2019-08-23 DIAGNOSIS — Q211 Atrial septal defect: Secondary | ICD-10-CM | POA: Diagnosis not present

## 2019-08-23 DIAGNOSIS — Z87891 Personal history of nicotine dependence: Secondary | ICD-10-CM

## 2019-08-23 DIAGNOSIS — L03311 Cellulitis of abdominal wall: Secondary | ICD-10-CM | POA: Diagnosis present

## 2019-08-23 DIAGNOSIS — R0789 Other chest pain: Secondary | ICD-10-CM | POA: Diagnosis not present

## 2019-08-23 DIAGNOSIS — Z7901 Long term (current) use of anticoagulants: Secondary | ICD-10-CM | POA: Diagnosis not present

## 2019-08-23 DIAGNOSIS — Z89421 Acquired absence of other right toe(s): Secondary | ICD-10-CM

## 2019-08-23 DIAGNOSIS — Z8614 Personal history of Methicillin resistant Staphylococcus aureus infection: Secondary | ICD-10-CM

## 2019-08-23 DIAGNOSIS — I5082 Biventricular heart failure: Secondary | ICD-10-CM | POA: Diagnosis present

## 2019-08-23 DIAGNOSIS — Q2112 Patent foramen ovale: Secondary | ICD-10-CM

## 2019-08-23 DIAGNOSIS — J189 Pneumonia, unspecified organism: Secondary | ICD-10-CM | POA: Diagnosis not present

## 2019-08-23 DIAGNOSIS — J440 Chronic obstructive pulmonary disease with acute lower respiratory infection: Secondary | ICD-10-CM | POA: Diagnosis present

## 2019-08-23 DIAGNOSIS — R6520 Severe sepsis without septic shock: Secondary | ICD-10-CM | POA: Diagnosis not present

## 2019-08-23 DIAGNOSIS — M6281 Muscle weakness (generalized): Secondary | ICD-10-CM | POA: Diagnosis not present

## 2019-08-23 DIAGNOSIS — J9601 Acute respiratory failure with hypoxia: Secondary | ICD-10-CM | POA: Diagnosis not present

## 2019-08-23 DIAGNOSIS — E669 Obesity, unspecified: Secondary | ICD-10-CM | POA: Diagnosis not present

## 2019-08-23 DIAGNOSIS — R278 Other lack of coordination: Secondary | ICD-10-CM | POA: Diagnosis not present

## 2019-08-23 DIAGNOSIS — E1129 Type 2 diabetes mellitus with other diabetic kidney complication: Secondary | ICD-10-CM | POA: Diagnosis not present

## 2019-08-23 DIAGNOSIS — I236 Thrombosis of atrium, auricular appendage, and ventricle as current complications following acute myocardial infarction: Secondary | ICD-10-CM | POA: Diagnosis not present

## 2019-08-23 DIAGNOSIS — Z6835 Body mass index (BMI) 35.0-35.9, adult: Secondary | ICD-10-CM

## 2019-08-23 DIAGNOSIS — F0390 Unspecified dementia without behavioral disturbance: Secondary | ICD-10-CM | POA: Diagnosis present

## 2019-08-23 DIAGNOSIS — R32 Unspecified urinary incontinence: Secondary | ICD-10-CM | POA: Diagnosis not present

## 2019-08-23 DIAGNOSIS — Z20828 Contact with and (suspected) exposure to other viral communicable diseases: Secondary | ICD-10-CM | POA: Diagnosis not present

## 2019-08-23 DIAGNOSIS — N529 Male erectile dysfunction, unspecified: Secondary | ICD-10-CM | POA: Diagnosis present

## 2019-08-23 DIAGNOSIS — Z8711 Personal history of peptic ulcer disease: Secondary | ICD-10-CM

## 2019-08-23 DIAGNOSIS — E7800 Pure hypercholesterolemia, unspecified: Secondary | ICD-10-CM | POA: Diagnosis present

## 2019-08-23 DIAGNOSIS — Z794 Long term (current) use of insulin: Secondary | ICD-10-CM

## 2019-08-23 DIAGNOSIS — I959 Hypotension, unspecified: Secondary | ICD-10-CM | POA: Diagnosis present

## 2019-08-23 DIAGNOSIS — Z452 Encounter for adjustment and management of vascular access device: Secondary | ICD-10-CM

## 2019-08-23 DIAGNOSIS — E11319 Type 2 diabetes mellitus with unspecified diabetic retinopathy without macular edema: Secondary | ICD-10-CM | POA: Diagnosis present

## 2019-08-23 DIAGNOSIS — D649 Anemia, unspecified: Secondary | ICD-10-CM | POA: Diagnosis not present

## 2019-08-23 DIAGNOSIS — R410 Disorientation, unspecified: Secondary | ICD-10-CM | POA: Diagnosis not present

## 2019-08-23 DIAGNOSIS — E875 Hyperkalemia: Secondary | ICD-10-CM | POA: Diagnosis present

## 2019-08-23 DIAGNOSIS — N179 Acute kidney failure, unspecified: Secondary | ICD-10-CM | POA: Diagnosis present

## 2019-08-23 DIAGNOSIS — Z85828 Personal history of other malignant neoplasm of skin: Secondary | ICD-10-CM | POA: Diagnosis not present

## 2019-08-23 DIAGNOSIS — I2510 Atherosclerotic heart disease of native coronary artery without angina pectoris: Secondary | ICD-10-CM | POA: Diagnosis present

## 2019-08-23 DIAGNOSIS — Z8249 Family history of ischemic heart disease and other diseases of the circulatory system: Secondary | ICD-10-CM

## 2019-08-23 DIAGNOSIS — L03115 Cellulitis of right lower limb: Secondary | ICD-10-CM | POA: Diagnosis not present

## 2019-08-23 DIAGNOSIS — E559 Vitamin D deficiency, unspecified: Secondary | ICD-10-CM | POA: Diagnosis not present

## 2019-08-23 DIAGNOSIS — I252 Old myocardial infarction: Secondary | ICD-10-CM

## 2019-08-23 DIAGNOSIS — J449 Chronic obstructive pulmonary disease, unspecified: Secondary | ICD-10-CM | POA: Diagnosis not present

## 2019-08-23 DIAGNOSIS — R000 Tachycardia, unspecified: Secondary | ICD-10-CM | POA: Diagnosis not present

## 2019-08-23 DIAGNOSIS — I5032 Chronic diastolic (congestive) heart failure: Secondary | ICD-10-CM | POA: Diagnosis not present

## 2019-08-23 DIAGNOSIS — R21 Rash and other nonspecific skin eruption: Secondary | ICD-10-CM | POA: Diagnosis not present

## 2019-08-23 DIAGNOSIS — N189 Chronic kidney disease, unspecified: Secondary | ICD-10-CM | POA: Diagnosis present

## 2019-08-23 DIAGNOSIS — E872 Acidosis, unspecified: Secondary | ICD-10-CM

## 2019-08-23 DIAGNOSIS — R579 Shock, unspecified: Secondary | ICD-10-CM | POA: Diagnosis not present

## 2019-08-23 DIAGNOSIS — I25118 Atherosclerotic heart disease of native coronary artery with other forms of angina pectoris: Secondary | ICD-10-CM | POA: Diagnosis not present

## 2019-08-23 DIAGNOSIS — H548 Legal blindness, as defined in USA: Secondary | ICD-10-CM | POA: Diagnosis present

## 2019-08-23 DIAGNOSIS — E1151 Type 2 diabetes mellitus with diabetic peripheral angiopathy without gangrene: Secondary | ICD-10-CM | POA: Diagnosis not present

## 2019-08-23 DIAGNOSIS — L97521 Non-pressure chronic ulcer of other part of left foot limited to breakdown of skin: Secondary | ICD-10-CM | POA: Diagnosis not present

## 2019-08-23 DIAGNOSIS — Z7401 Bed confinement status: Secondary | ICD-10-CM | POA: Diagnosis not present

## 2019-08-23 DIAGNOSIS — Z89422 Acquired absence of other left toe(s): Secondary | ICD-10-CM

## 2019-08-23 DIAGNOSIS — F40240 Claustrophobia: Secondary | ICD-10-CM | POA: Diagnosis present

## 2019-08-23 DIAGNOSIS — E1122 Type 2 diabetes mellitus with diabetic chronic kidney disease: Secondary | ICD-10-CM | POA: Diagnosis present

## 2019-08-23 DIAGNOSIS — B9562 Methicillin resistant Staphylococcus aureus infection as the cause of diseases classified elsewhere: Secondary | ICD-10-CM | POA: Diagnosis present

## 2019-08-23 DIAGNOSIS — Z9861 Coronary angioplasty status: Secondary | ICD-10-CM

## 2019-08-23 DIAGNOSIS — Z7982 Long term (current) use of aspirin: Secondary | ICD-10-CM

## 2019-08-23 DIAGNOSIS — A419 Sepsis, unspecified organism: Secondary | ICD-10-CM | POA: Diagnosis not present

## 2019-08-23 DIAGNOSIS — Z89412 Acquired absence of left great toe: Secondary | ICD-10-CM

## 2019-08-23 DIAGNOSIS — I361 Nonrheumatic tricuspid (valve) insufficiency: Secondary | ICD-10-CM | POA: Diagnosis not present

## 2019-08-23 LAB — URINALYSIS, ROUTINE W REFLEX MICROSCOPIC
Bilirubin Urine: NEGATIVE
Bilirubin Urine: NEGATIVE
Glucose, UA: NEGATIVE mg/dL
Glucose, UA: NEGATIVE mg/dL
Ketones, ur: NEGATIVE mg/dL
Ketones, ur: NEGATIVE mg/dL
Nitrite: NEGATIVE
Nitrite: NEGATIVE
Protein, ur: 100 mg/dL — AB
Protein, ur: 100 mg/dL — AB
RBC / HPF: >50 RBC/hpf — ABNORMAL HIGH (ref 0–5)
RBC / HPF: >50 RBC/hpf — ABNORMAL HIGH (ref 0–5)
Specific Gravity, Urine: 1.018 (ref 1.005–1.030)
Specific Gravity, Urine: 1.017 (ref 1.005–1.030)
WBC, UA: >50 WBC/hpf — ABNORMAL HIGH (ref 0–5)
pH: 5 (ref 5.0–8.0)
pH: 5 (ref 5.0–8.0)

## 2019-08-23 LAB — COMPREHENSIVE METABOLIC PANEL
ALT: 14 U/L (ref 0–44)
AST: 22 U/L (ref 15–41)
Albumin: 3 g/dL — ABNORMAL LOW (ref 3.5–5.0)
Alkaline Phosphatase: 55 U/L (ref 38–126)
Anion gap: 16 — ABNORMAL HIGH (ref 5–15)
BUN: 79 mg/dL — ABNORMAL HIGH (ref 8–23)
CO2: 28 mmol/L (ref 22–32)
Calcium: 9.5 mg/dL (ref 8.9–10.3)
Chloride: 88 mmol/L — ABNORMAL LOW (ref 98–111)
Creatinine, Ser: 4.58 mg/dL — ABNORMAL HIGH (ref 0.61–1.24)
GFR calc Af Amer: 13 mL/min — ABNORMAL LOW (ref >60)
GFR calc non Af Amer: 11 mL/min — ABNORMAL LOW (ref >60)
Glucose, Bld: 127 mg/dL — ABNORMAL HIGH (ref 70–99)
Potassium: 5.4 mmol/L — ABNORMAL HIGH (ref 3.5–5.1)
Sodium: 132 mmol/L — ABNORMAL LOW (ref 135–145)
Total Bilirubin: 1 mg/dL (ref 0.3–1.2)
Total Protein: 6.4 g/dL — ABNORMAL LOW (ref 6.5–8.1)

## 2019-08-23 LAB — SODIUM, URINE, RANDOM: Sodium, Ur: 19 mmol/L

## 2019-08-23 LAB — CBC WITH DIFFERENTIAL/PLATELET
Abs Immature Granulocytes: 0.14 10*3/uL — ABNORMAL HIGH (ref 0.00–0.07)
Basophils Absolute: 0 10*3/uL (ref 0.0–0.1)
Basophils Relative: 0 %
Eosinophils Absolute: 0 10*3/uL (ref 0.0–0.5)
Eosinophils Relative: 0 %
HCT: 33.4 % — ABNORMAL LOW (ref 39.0–52.0)
Hemoglobin: 10.8 g/dL — ABNORMAL LOW (ref 13.0–17.0)
Immature Granulocytes: 1 %
Lymphocytes Relative: 3 %
Lymphs Abs: 0.4 10*3/uL — ABNORMAL LOW (ref 0.7–4.0)
MCH: 30.1 pg (ref 26.0–34.0)
MCHC: 32.3 g/dL (ref 30.0–36.0)
MCV: 93 fL (ref 80.0–100.0)
Monocytes Absolute: 0.7 10*3/uL (ref 0.1–1.0)
Monocytes Relative: 6 %
Neutro Abs: 11 10*3/uL — ABNORMAL HIGH (ref 1.7–7.7)
Neutrophils Relative %: 90 %
Platelets: 249 10*3/uL (ref 150–400)
RBC: 3.59 MIL/uL — ABNORMAL LOW (ref 4.22–5.81)
RDW: 15.9 % — ABNORMAL HIGH (ref 11.5–15.5)
WBC: 12.3 10*3/uL — ABNORMAL HIGH (ref 4.0–10.5)
nRBC: 0 % (ref 0.0–0.2)

## 2019-08-23 LAB — CREATININE, URINE, RANDOM: Creatinine, Urine: 92.81 mg/dL

## 2019-08-23 LAB — SARS CORONAVIRUS 2 BY RT PCR (HOSPITAL ORDER, PERFORMED IN ~~LOC~~ HOSPITAL LAB): SARS Coronavirus 2: NEGATIVE

## 2019-08-23 LAB — PROTIME-INR
INR: 2.3 — ABNORMAL HIGH (ref 0.8–1.2)
Prothrombin Time: 25 seconds — ABNORMAL HIGH (ref 11.4–15.2)

## 2019-08-23 LAB — BRAIN NATRIURETIC PEPTIDE: B Natriuretic Peptide: 1871.6 pg/mL — ABNORMAL HIGH (ref 0.0–100.0)

## 2019-08-23 LAB — APTT: aPTT: 41 seconds — ABNORMAL HIGH (ref 24–36)

## 2019-08-23 LAB — LACTIC ACID, PLASMA
Lactic Acid, Venous: 3 mmol/L (ref 0.5–1.9)
Lactic Acid, Venous: 3.1 mmol/L (ref 0.5–1.9)

## 2019-08-23 LAB — CBG MONITORING, ED: Glucose-Capillary: 107 mg/dL — ABNORMAL HIGH (ref 70–99)

## 2019-08-23 MED ORDER — INSULIN ASPART 100 UNIT/ML ~~LOC~~ SOLN
0.0000 [IU] | SUBCUTANEOUS | Status: DC
  Administered 2019-08-26 – 2019-08-27 (×3): 1 [IU] via SUBCUTANEOUS
  Administered 2019-08-27: 16:00:00 2 [IU] via SUBCUTANEOUS
  Administered 2019-08-27 (×2): 1 [IU] via SUBCUTANEOUS
  Administered 2019-08-28 (×2): 2 [IU] via SUBCUTANEOUS
  Administered 2019-08-28 (×2): 1 [IU] via SUBCUTANEOUS
  Administered 2019-08-28: 2 [IU] via SUBCUTANEOUS
  Administered 2019-08-29 (×2): 1 [IU] via SUBCUTANEOUS
  Administered 2019-08-29 (×2): 2 [IU] via SUBCUTANEOUS
  Administered 2019-08-30: 1 [IU] via SUBCUTANEOUS
  Administered 2019-08-30 – 2019-08-31 (×4): 2 [IU] via SUBCUTANEOUS
  Administered 2019-08-31: 1 [IU] via SUBCUTANEOUS
  Administered 2019-09-01: 3 [IU] via SUBCUTANEOUS
  Administered 2019-09-01 (×2): 1 [IU] via SUBCUTANEOUS
  Administered 2019-09-01: 2 [IU] via SUBCUTANEOUS
  Administered 2019-09-01: 1 [IU] via SUBCUTANEOUS
  Administered 2019-09-02: 2 [IU] via SUBCUTANEOUS
  Administered 2019-09-02: 3 [IU] via SUBCUTANEOUS
  Administered 2019-09-02: 2 [IU] via SUBCUTANEOUS
  Administered 2019-09-03: 1 [IU] via SUBCUTANEOUS
  Administered 2019-09-03 (×2): 2 [IU] via SUBCUTANEOUS
  Administered 2019-09-03 – 2019-09-04 (×3): 1 [IU] via SUBCUTANEOUS
  Administered 2019-09-04 (×2): 2 [IU] via SUBCUTANEOUS
  Administered 2019-09-05 (×3): 1 [IU] via SUBCUTANEOUS
  Administered 2019-09-05: 2 [IU] via SUBCUTANEOUS
  Administered 2019-09-06: 1 [IU] via SUBCUTANEOUS
  Administered 2019-09-06: 2 [IU] via SUBCUTANEOUS
  Administered 2019-09-06: 1 [IU] via SUBCUTANEOUS

## 2019-08-23 MED ORDER — ONDANSETRON HCL 4 MG/2ML IJ SOLN: 4.0000 mg | Freq: Four times a day (QID) | INTRAMUSCULAR | Status: DC | PRN

## 2019-08-23 MED ORDER — ACETAMINOPHEN 325 MG PO TABS: 650.0000 mg | ORAL_TABLET | Freq: Four times a day (QID) | ORAL | Status: DC | PRN

## 2019-08-23 MED ORDER — MOMETASONE FURO-FORMOTEROL FUM 100-5 MCG/ACT IN AERO
2.0000 | INHALATION_SPRAY | Freq: Two times a day (BID) | RESPIRATORY_TRACT | Status: DC
  Administered 2019-08-23 – 2019-09-06 (×15): 2 via RESPIRATORY_TRACT
  Filled 2019-08-23 (×2): qty 8.8

## 2019-08-23 MED ORDER — SODIUM CHLORIDE 0.9 % IV SOLN
100.0000 mg | Freq: Once | INTRAVENOUS | Status: AC
  Administered 2019-08-23: 100 mg via INTRAVENOUS
  Filled 2019-08-23: qty 100

## 2019-08-23 MED ORDER — ONDANSETRON HCL 4 MG PO TABS: 4.0000 mg | ORAL_TABLET | Freq: Four times a day (QID) | ORAL | Status: DC | PRN

## 2019-08-23 MED ORDER — SODIUM CHLORIDE 0.9 % IV SOLN
2.0000 g | INTRAVENOUS | Status: DC
  Administered 2019-08-23: 2 g via INTRAVENOUS
  Filled 2019-08-23: qty 20

## 2019-08-23 MED ORDER — ALBUTEROL SULFATE (2.5 MG/3ML) 0.083% IN NEBU: 2.5000 mg | INHALATION_SOLUTION | Freq: Four times a day (QID) | RESPIRATORY_TRACT | Status: DC | PRN

## 2019-08-23 MED ORDER — ACETAMINOPHEN 650 MG RE SUPP: 650.0000 mg | Freq: Four times a day (QID) | RECTAL | Status: DC | PRN

## 2019-08-23 NOTE — ED Notes (Signed)
Updates wife Charles [name].   Pt taken off BiPap at this time for rest. Pt on 3 L nasal Canula. RR 18-20 SpO2 98-100%   Pt talking to wife at this time

## 2019-08-23 NOTE — ED Notes (Signed)
This RN spoke with patient's wife Joaquim Lai and updated her. Wife requests that she receive a call when/or if the patient is admitted.

## 2019-08-23 NOTE — H&P (Addendum)
History and Physical    Charles [name] [id number] DOB: 1941/07/06 DOA: 08/23/2019  PCP: Cassandria Anger, MD  Patient coming from: Home.  Chief Complaint: Shortness of breath and weakness.  History obtained from patient, patient's wife, ER physician, previous records.  HPI: Charles [name] is a 78 y.o. male with history of CAD managed medically, chronic diastolic CHF, diabetes mellitus, sleep apnea, COPD, history of CVA, atrial fibrillation who was recently admitted and discharged 3 weeks ago after patient inadvertently took overdose of Lasix.  Patient was observed in the hospital and eventually discharged home on torsemide.  At the time patient also was started on apixaban for A. fib.  Patient was discharged to rehab.  And eventually was discharged back to home.  Patient wife states that patient has been doing poorly since discharge from the hospital and has gradually worsened shortness of breath with increasing lower extremity edema.  Over the last 2 days patient has become very short of breath with minimal exertion.  Has been eating poorly.  Has not had any nausea vomiting or diarrhea.  Has not had any fever chills.  Did not lose consciousness.  ED Course: In the ER patient was mildly hypotensive initially.  Afebrile.  Patient was hypoxic.  Chest x-ray was showing congestion with possible infiltrates.  COVID-19 test was negative.  Lab work show the abdomen is acutely worsened from 1 on August 09, 2019 and presently it is 4.5.  WBC count was 12.3.  UA shows features concerning for UTI and also seen hyaline cast more than 50 RBC with rare bacteria.  EKG shows tachycardia.  Lactic acid was 3 BNP was 1800.  Patient was started on ceftriaxone for possible UTI.  On CPAP.  Admitted for acute hypoxic respiratory failure with anasarca acute renal failure.  Critical care was notified.  Review of Systems: As per HPI, rest all negative.   Past Medical History:  Diagnosis Date  . Arthritis   . B12  deficiency   . CAD (coronary artery disease)    a. Approx. 2000 - MI. Cath: single vsl dz, PTCA dLAD/medical rx;  b. NSTEMI 11/13 => IVUS attempted for RCA but not successful; anatomy felt stable from 2000 => med Rx. c. [place] 08/2016: severe stable diffuse dLAD, severe prox RCA -> PCI of RCA unsuccessful; d. 05/2017 NSTEMI/Cath: LM nl, LAD 20ost/p, 83m, 80d, LCX 30ost, 20p, 28m, OM2 90, RCA 80p, 99d.  . Cancer ([place])    skin cancer- head   . Cataracts   . Cholelithiasis   . Chronic diastolic CHF (congestive heart failure) ([place])    a. 05/2017 Echo: EF 60-65%, Gr2 DD, mildly dil Ao, triv MR, mildly to mod dil LA, mild PR.  . Claustrophobia   . COPD (chronic obstructive pulmonary disease) ([place])   . CVA (cerebral vascular accident) ([place]) ~ 2001   vision inparted from stroke.  Visual Memory loss  . Diabetic neuropathy ([place])   . Diverticulitis   . Dysrhythmia    if he does not take Metoprolol- Afib  . ED (erectile dysfunction)   . Essential hypertension   . History of MRSA infection    Right foot  . History of stomach ulcers   . HOH (hard of hearing)   . Hypercholesterolemia   . Legally blind   . Lower extremity edema   . Morbid obesity ([place])   . Neuropathy   . NSTEMI (non-ST elevated myocardial infarction) ([place]) 05/2017  . OSA on CPAP   . PAF (  paroxysmal atrial fibrillation) ([name])    a. confirmed by event monitor. b. 02/2014 rash on Coumadin, patient decided to discontinue Xarelto due to possible rash, cost and lawyers ads on TV, agreed to take Plavix.  . Pneumonia 2016  . Tunnel vision    Since stroke  . Type II diabetes mellitus ([place])   . Venous stasis   . Weakness 01/11/2017    Past Surgical History:  Procedure Laterality Date  . CARDIAC CATHETERIZATION  1990's  . CARDIAC CATHETERIZATION N/A 08/17/2016   Procedure: Left Heart Cath and Coronary Angiography;  Surgeon: Peter M Martinique, MD;  Location: [place] [facility];  Service: Cardiovascular;  Laterality: N/A;  . CARDIAC  CATHETERIZATION N/A 08/19/2016   Procedure: Coronary Balloon Angioplasty;  Surgeon: Peter M Martinique, MD;  Location: [place] [facility];  Service: Cardiovascular;  Laterality: N/A;  . CATARACT EXTRACTION  10/2016  . CEREBRAL ANGIOGRAM  ~ 2000  . COLONOSCOPY    . LEFT HEART CATH AND CORONARY ANGIOGRAPHY N/A 06/01/2017   Procedure: Left Heart Cath and Coronary Angiography;  Surgeon: Burnell Blanks, MD;  Location: [place] [facility];  Service: Cardiovascular;  Laterality: N/A;  . LEFT HEART CATHETERIZATION WITH CORONARY ANGIOGRAM N/A 10/27/2012   Procedure: LEFT HEART CATHETERIZATION WITH CORONARY ANGIOGRAM;  Surgeon: Burnell Blanks, MD;  Location: [name] CATH LAB;  Service: Cardiovascular;  Laterality: N/A;  . TOE AMPUTATION  2006; 2009   "Dr. Blenda Mounts; big toe left foot; little toe on right foot" (10/26/2012)  . TOE AMPUTATION Right 12/24/2015   2nd & 3 toes/notes 1/13//2017  . TOE AMPUTATION Right    5TH TOE  . 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 His smoking use included cigarettes and cigars. He has a 30.00 pack-year smoking history. He quit smokeless tobacco use about 3 years ago. He reports current alcohol use. He reports that he does not use drugs.  Allergies  Allergen Reactions  . Tolterodine Swelling and Other (See Comments)    (Detrol) Dry mouth/dry throat, severe swelling of the legs, and started a "downward spiral"  . Lipitor [Atorvastatin] Other (See Comments)    Muscle aches  . Xarelto [Rivaroxaban] Rash    Family History  Problem Relation Age of Onset  . Breast cancer Mother 55  . Heart disease Father 72  . Asthma Neg Hx     Prior to Admission medications   Medication Sig Start Date End Date Taking? Authorizing Provider  acetaminophen (TYLENOL) 500 MG tablet Take 1,000 mg by mouth at  bedtime.    Yes [provider]  albuterol (VENTOLIN HFA) 108 (90 Base) MCG/ACT inhaler Inhale 1-2 puffs into the lungs every 6 (six) hours as needed for wheezing or shortness of breath.   Yes [provider]  apixaban (ELIQUIS) 5 MG TABS tablet Take 1 tablet (5 mg total) by mouth 2 (two) times daily. 08/08/19  Yes Mercy Riding, MD  aspirin EC 81 MG tablet Take 81 mg by mouth daily.   Yes [provider]  Cholecalciferol (VITAMIN D-3) 25 MCG (1000 UT) CAPS Take 1,000 Units by mouth daily.   Yes [provider]  Fluticasone-Salmeterol (ADVAIR DISKUS) 100-50 MCG/DOSE AEPB Inhale 1 puff into the lungs 2 (two) times daily. 03/02/16  Yes Plotnikov, Evie Lacks, MD  gabapentin (NEURONTIN) 300 MG capsule TAKE 1 CAPSULE  THREE TIMES DAILY Patient taking differently: Take 300 mg by mouth 2 (two) times daily.  08/04/18  Yes Plotnikov, Evie Lacks, MD  insulin aspart (NOVOLOG) 100 UNIT/ML FlexPen Inject 20 Units into the skin 4 (four) times daily - after meals and at bedtime. Follow SS Patient taking differently: Inject 1-5 Units into the skin See admin instructions. Inject 1-5 units into the skin three to four times a day, per sliding scale 08/23/18  Yes Plotnikov, Evie Lacks, MD  isosorbide mononitrate (IMDUR) 60 MG 24 hr tablet TAKE 1 TABLET EVERY DAY Patient taking differently: Take 60 mg by mouth daily.  05/08/19  Yes Plotnikov, Evie Lacks, MD  magnesium oxide (MAG-OX) 400 (241.3 Mg) MG tablet Take 1 tablet (400 mg total) by mouth 2 (two) times daily. 08/09/19  Yes Mercy Riding, MD  metFORMIN (GLUCOPHAGE) 500 MG tablet TAKE 1 TABLET TWICE DAILY WITH A MEAL Patient taking differently: Take 500 mg by mouth 2 (two) times daily with a meal.  05/08/19  Yes Plotnikov, Evie Lacks, MD  metoprolol tartrate (LOPRESSOR) 25 MG tablet Take 1 tablet (25 mg total) by mouth 2 (two) times daily. 08/04/18  Yes Plotnikov, Evie Lacks, MD  mupirocin ointment (BACTROBAN) 2 % Apply 1 application topically  See admin instructions. Apply to affected areas of bilateral feet, ankles, and legs once daily    Yes [provider]  nitroGLYCERIN (NITROSTAT) 0.4 MG SL tablet DISSOLVE 1 TABLET (0.4 MG TOTAL) UNDER THE TONGUE EVERY 5 (FIVE) MINUTES AS NEEDED (UP TO 3 DOSES) FOR CHEST PAIN Patient taking differently: Place 0.4 mg under the tongue every 5 (five) minutes as needed for chest pain.  05/08/19  Yes Plotnikov, Evie Lacks, MD  pravastatin (PRAVACHOL) 40 MG tablet TAKE 1 TABLET EVERY DAY Patient taking differently: Take 40 mg by mouth at bedtime.  05/08/19  Yes Plotnikov, Evie Lacks, MD  PRESCRIPTION MEDICATION See admin instructions. CPAP- At bedtime   Yes [provider]  ranolazine (RANEXA) 500 MG 12 hr tablet TAKE 1 TABLET TWICE DAILY Patient taking differently: Take 500 mg by mouth 2 (two) times daily.  08/03/18  Yes Plotnikov, Evie Lacks, MD  torsemide (DEMADEX) 20 MG tablet Take 40 mg by mouth See admin instructions. Take 40 mg by mouth two times a day and additional 20 mg once daily until desired amount of output of urine is achieved   Yes [provider]  triamcinolone ointment (KENALOG) 0.5 % Apply 1 application topically 2 (two) times daily. Patient taking differently: Apply 1 application topically 2 (two) times daily as needed (for itching).  02/21/19 02/21/20 Yes Plotnikov, Evie Lacks, MD  vitamin B-12 (CYANOCOBALAMIN) 1000 MCG tablet Take 1,000 mcg by mouth daily.    Yes [provider]  collagenase (SANTYL) ointment Apply 1 application topically daily. 11/29/16   Trula Slade, DPM  doxycycline (VIBRA-TABS) 100 MG tablet Take 1 tablet (100 mg total) by mouth every 12 (twelve) hours. Patient not taking: Reported on 08/23/2019 08/08/19   Mercy Riding, MD  torsemide (DEMADEX) 100 MG tablet Take 1 tablet (100 mg total) by mouth daily. 08/21/19   Plotnikov, Evie Lacks, MD    Physical Exam: Constitutional: Moderately built and nourished. Vitals:   08/23/19 1928  08/23/19 2004 08/23/19 2120 08/23/19 2215  BP:  (!) 91/53 (!) 127/102 (!) 123/97  Pulse: 88 83 64 73  Resp: (!) 23 18 (!) 22 18  Temp:      TempSrc:      SpO2: 100% 100%  100% 100%  Weight:      Height:       Eyes: Anicteric no pallor. ENMT: No discharge from the ears eyes nose or mouth. Neck: No mass or.  No neck rigidity no JVD appreciated. Respiratory: No rhonchi no crepitations. Cardiovascular: S1-S2 heard. Abdomen: Soft mildly distended no guarding or rigidity. Musculoskeletal: Bilateral lower extremity edema extending up to the thighs. Skin: Chronic skin changes in the lower extremity. Neurologic: Alert awake oriented to his name and follows commands moves all extremities. Psychiatric: Appears normal.   Labs on Admission: I have personally reviewed following labs and imaging studies  CBC: Recent Labs  Lab 08/23/19 1655  WBC 12.3*  NEUTROABS 11.0*  HGB 10.8*  HCT 33.4*  MCV 93.0  PLT 0000000   Basic Metabolic Panel: Recent Labs  Lab 08/23/19 1655  NA 132*  K 5.4*  CL 88*  CO2 28  GLUCOSE 127*  BUN 79*  CREATININE 4.58*  CALCIUM 9.5   GFR: Estimated Creatinine Clearance: 20.1 mL/min (A) (by C-G formula based on SCr of 4.58 mg/dL (H)). Liver Function Tests: Recent Labs  Lab 08/23/19 1655  AST 22  ALT 14  ALKPHOS 55  BILITOT 1.0  PROT 6.4*  ALBUMIN 3.0*   No results for input(s): LIPASE, AMYLASE in the last 168 hours. No results for input(s): AMMONIA in the last 168 hours. Coagulation Profile: Recent Labs  Lab 08/23/19 1845  INR 2.3*   Cardiac Enzymes: No results for input(s): CKTOTAL, CKMB, CKMBINDEX, TROPONINI in the last 168 hours. BNP (last 3 results) No results for input(s): PROBNP in the last 8760 hours. HbA1C: No results for input(s): HGBA1C in the last 72 hours. CBG: No results for input(s): GLUCAP in the last 168 hours. Lipid Profile: No results for input(s): CHOL, HDL, LDLCALC, TRIG, CHOLHDL, LDLDIRECT in the last 72 hours. Thyroid  Function Tests: No results for input(s): TSH, T4TOTAL, FREET4, T3FREE, THYROIDAB in the last 72 hours. Anemia Panel: No results for input(s): VITAMINB12, FOLATE, FERRITIN, TIBC, IRON, RETICCTPCT in the last 72 hours. Urine analysis:    Component Value Date/Time   COLORURINE AMBER (A) 08/23/2019 2231   APPEARANCEUR HAZY (A) 08/23/2019 2231   LABSPEC 1.017 08/23/2019 2231   PHURINE 5.0 08/23/2019 2231   GLUCOSEU NEGATIVE 08/23/2019 2231   GLUCOSEU NEGATIVE 05/25/2019 1353   HGBUR MODERATE (A) 08/23/2019 2231   BILIRUBINUR NEGATIVE 08/23/2019 2231   KETONESUR NEGATIVE 08/23/2019 2231   PROTEINUR 100 (A) 08/23/2019 2231   UROBILINOGEN 1.0 05/25/2019 1353   NITRITE NEGATIVE 08/23/2019 2231   LEUKOCYTESUR SMALL (A) 08/23/2019 2231   Sepsis Labs: @LABRCNTIP (procalcitonin:4,lacticidven:4) ) Recent Results (from the past 240 hour(s))  SARS Coronavirus 2 [facility] : [facility] order, Performed in [facility] hospital lab) Nasopharyngeal Nasopharyngeal Swab     Status: None   Collection Time: 08/23/19  5:16 PM   Specimen: Nasopharyngeal Swab  Result Value Ref Range Status   SARS Coronavirus 2 NEGATIVE NEGATIVE Final    Comment: (NOTE) If result is NEGATIVE SARS-CoV-2 target nucleic acids are NOT DETECTED. The SARS-CoV-2 RNA is generally detectable in upper and lower  respiratory specimens during the acute phase of infection. The lowest  concentration of SARS-CoV-2 viral copies this assay can detect is 250  copies / mL. A negative result does not preclude SARS-CoV-2 infection  and should not be used as the sole basis for treatment or other  patient management decisions.  A negative result may occur with  improper specimen collection / handling, submission of specimen other  than nasopharyngeal swab, presence of viral mutation(s) within the  areas targeted by this assay, and inadequate number of viral copies  (<250 copies / mL). A negative result must be combined with clinical  observations, patient  history, and epidemiological information. If result is POSITIVE SARS-CoV-2 target nucleic acids are DETECTED. The SARS-CoV-2 RNA is generally detectable in upper and lower  respiratory specimens dur ing the acute phase of infection.  Positive  results are indicative of active infection with SARS-CoV-2.  Clinical  correlation with patient history and other diagnostic information is  necessary to determine patient infection status.  Positive results do  not rule out bacterial infection or co-infection with other viruses. If result is PRESUMPTIVE POSTIVE SARS-CoV-2 nucleic acids MAY BE PRESENT.   A presumptive positive result was obtained on the submitted specimen  and confirmed on repeat testing.  While 2019 novel coronavirus  (SARS-CoV-2) nucleic acids may be present in the submitted sample  additional confirmatory testing may be necessary for epidemiological  and / or clinical management purposes  to differentiate between  SARS-CoV-2 and other Sarbecovirus currently known to infect humans.  If clinically indicated additional testing with an alternate test  methodology [phone]) is advised. The SARS-CoV-2 RNA is generally  detectable in upper and lower respiratory sp ecimens during the acute  phase of infection. The expected result is Negative. Fact Sheet for Patients:  StrictlyIdeas.no Fact Sheet for Healthcare Providers: BankingDealers.co.za This test is not yet approved or cleared by the [place] FDA and has been authorized for detection and/or diagnosis of SARS-CoV-2 by FDA under an Emergency Use Authorization (EUA).  This EUA will remain in effect (meaning this test can be used) for the duration of the COVID-19 declaration under Section 564(b)(1) of the Act, 21 U.S.C. section 360bbb-3(b)(1), unless the authorization is terminated or revoked sooner. Performed at [facility], [place] [address] [place], [place] [address]       Radiological Exams on Admission: Dg Chest Port 1 View  Result Date: 08/23/2019 CLINICAL DATA:  Dyspnea EXAM: PORTABLE CHEST 1 VIEW COMPARISON:  06/01/2017 FINDINGS: Cardiomegaly. Mild pulmonary vascular congestion. Calcific aortic knob. Right basilar opacity. No pneumothorax. Advanced degenerative changes of the shoulders. IMPRESSION: 1. Right basilar opacity which may reflect pleural effusion with atelectasis and/or pneumonia. 2. Cardiomegaly and pulmonary vascular congestion suggesting CHF. Electronically Signed   By: Davina Poke M.D.   On: 08/23/2019 17:12    EKG: Independently reviewed.  Atrial tachycardia.  Low voltage.  Assessment/Plan Principal Problem:   ARF (acute renal failure) ([name]) Active Problems:   CAD (coronary artery disease)   Type 2 diabetes, uncontrolled, with retinopathy ([name])   PAF (paroxysmal atrial fibrillation) ([name])   Hypotension    1. Acute hypoxic respiratory failure with anasarca and acute renal failure -on BiPAP at this time.  Discussed with nephrologist Dr. Justin Mend.  Also discussed with critical care.  Plan is to hold antihypertensive check 2D echo renal sonogram and closely monitor in stepdown for now.  Follow intake output metabolic panel avoid nephrotoxins.  May need vasopressors if there is further decline in blood pressure.  Blood pressure has improved [id number] systolic at the time of my exam.  Will check ABG.  We will going to hold all antihypertensives diuretics. 2. Paroxysmal atrial fibrillation rate is presently controlled.  Holding metoprolol due to low normal blood pressure.  We will keep patient on heparin.  Hold apixaban due to renal failure. 3. Diabetes mellitus type 2 we will check CBGs every  4 hours with sliding scale coverage. 4. History of CAD presently denies any chest pain will check 2D echo.  On heparin. 5. Chronic diastolic CHF see #1. 6. History of sleep apnea. 7. History of COPD.  Presently on CPAP. 8. Recently admitted for Lasix  overdose inadvertently. 9. History of chronic lower extremity cellulitis recently treated with doxycycline. 10. Possible UTI and possible pneumonia.  Will check procalcitonin and follow cultures presently on ceftriaxone. 11. Anemia likely from renal disease follow CBC. 12. Hyperlipidemia on statins.  Check CK levels.  Given that patient is having acute renal failure with acute hypoxic respiratory failure hypotension will need close monitoring and will need inpatient status.  I have discussed with pulmonary critical care.  I have also consulted nephrology.   DVT prophylaxis: Heparin infusion. Code Status: Full code confirmed with patient's wife. Family Communication: Patient's wife. Disposition Plan: To be determined. Consults called: Nephrology.  Discussed with pulmonary critical care. Admission status: Inpatient.   Rise Patience MD [facility] Pager [phone].  If 7PM-7AM, please contact night-coverage [email or web address] Password [email or web address]  08/23/2019, 11:15 PM

## 2019-08-23 NOTE — Telephone Encounter (Signed)
LM giving verbals, FYI 

## 2019-08-23 NOTE — ED Notes (Signed)
SDU  [phone] Joaquim Lai pts wife wants an update

## 2019-08-23 NOTE — ED Notes (Signed)
Admitting Dr. Hal Hope at bedside

## 2019-08-23 NOTE — Progress Notes (Signed)
Pt requesting to be taken off Bipap for a small break. Pt taken off BIpap and placed back on 3L [place].  Sp02 is 98%. Pt tolerating well

## 2019-08-23 NOTE — ED Notes (Addendum)
US renal: ultrasound changed to portable. Per [place] tech, to complete later tonight.

## 2019-08-23 NOTE — ED Notes (Signed)
Respiratory at bedside.

## 2019-08-23 NOTE — Consult Note (Signed)
Referring Provider: No ref. provider found Primary Care Physician:  Plotnikov, Evie Lacks, MD Primary Nephrologist:     Reason for Consultation: Acute kidney injury, anasarca, shock with hypotension.  Management of acid-base abnormalities, management of electrolyte abnormalities  HPI: This is a 78 year old gentleman with a history of coronary disease status post cardiac catheterization 2000 PTCA LAD.  Congestive heart failure with diastolic dysfunction COPD, hyperlipidemia obstructive sleep apnea.  Diabetes mellitus type 2.  History of CVA.  History of paroxysmal atrial fibrillation.  He was brought to the emergency room with hypotension, shortness of breath and lower extremity edema.  Creatinine 08/09/2019   1.03 Creatinine 08/23/2019   4.58  Blood pressure 91/53 pulse 83 temperature 97.4 O2 sats 100% on BiPAP  Urinalysis WBCs present greater than 50 RBCs greater than 50 urine 100 mg/dL  Sodium 132 potassium 5.4 chloride 188 CO2 28 BUN 79 creatinine 4.58 glucose 127 calcium 9.5 albumin 3.0 AST 22 ALT 14 WBCs 12.3 hemoglobin 10.8 platelets 248  Rocephin 2 g every 24 hours Doxycycline 100 mg IV  Albuterol 1 to 2 puffs every 6 hours Eliquis 5 mg twice daily, aspirin 81 mg daily cholecalciferol 1000 units daily fluticasone/salmeterol 1 puff twice daily gabapentin 300 mg 3 times daily, insulin sliding scale, isosorbide 60 mg daily, magnesium 400 mg twice daily, metformin 500 mg twice daily, metoprolol 25 mg twice daily, pravastatin 40 mg daily, Ranexa 500 mg every 12 hours,  daily, doxycycline 100   torsemide 100 mg daily  Chest x-ray right basilar opacity reflecting pleural effusion cardiomegaly pulmonary vascular congestion  Last 2D echo EF 60 to 65% 2018   Past Medical History:  Diagnosis Date  . Arthritis   . B12 deficiency   . CAD (coronary artery disease)    a. Approx. 2000 - MI. Cath: single vsl dz, PTCA dLAD/medical rx;  b. NSTEMI 11/13 => IVUS attempted for RCA but not successful;  anatomy felt stable from 2000 => med Rx. c. [place] 08/2016: severe stable diffuse dLAD, severe prox RCA -> PCI of RCA unsuccessful; d. 05/2017 NSTEMI/Cath: LM nl, LAD 20ost/p, 42m 80d, LCX 30ost, 20p, 659mOM2 90, RCA 80p, 99d.  . Cancer ([place]   skin cancer- head   . Cataracts   . Cholelithiasis   . Chronic diastolic CHF (congestive heart failure) ([facility]   a. 05/2017 Echo: EF 60-65%, Gr2 DD, mildly dil Ao, triv MR, mildly to mod dil LA, mild PR.  . Claustrophobia   . COPD (chronic obstructive pulmonary disease) ([facility] [place]  . CVA (cerebral vascular accident) ([facility]~ 2001   vision inparted from stroke.  Visual Memory loss  . Diabetic neuropathy ([facility]  . Diverticulitis   . Dysrhythmia    if he does not take Metoprolol- Afib  . ED (erectile dysfunction)   . Essential hypertension   . History of MRSA infection    Right foot  . History of stomach ulcers   . HOH (hard of hearing)   . Hypercholesterolemia   . Legally blind   . Lower extremity edema   . Morbid obesity ([facility]  . Neuropathy   . NSTEMI (non-ST elevated myocardial infarction) (HC[facility]io06/2018  . OSA on CPAP   . PAF (paroxysmal atrial fibrillation) ([name])    a. confirmed by event monitor. b. 02/2014 rash on Coumadin, patient decided to discontinue Xarelto due to possible rash, cost and lawyers ads on TV, agreed to take Plavix.  . Pneumonia 2016  . Tunnel vision  Since stroke  . Type II diabetes mellitus ([place])   . Venous stasis   . Weakness 01/11/2017    Past Surgical History:  Procedure Laterality Date  . CARDIAC CATHETERIZATION  1990's  . CARDIAC CATHETERIZATION N/A 08/17/2016   Procedure: Left Heart Cath and Coronary Angiography;  Surgeon: Peter M Martinique, MD;  Location: [facility];  Service: Cardiovascular;  Laterality: N/A;  . CARDIAC CATHETERIZATION N/A 08/19/2016   Procedure: Coronary Balloon Angioplasty;  Surgeon: Peter M Martinique, MD;  Location: [facility];  Service: Cardiovascular;  Laterality: N/A;  . CATARACT  EXTRACTION  10/2016  . CEREBRAL ANGIOGRAM  ~ 2000  . COLONOSCOPY    . LEFT HEART CATH AND CORONARY ANGIOGRAPHY N/A 06/01/2017   Procedure: Left Heart Cath and Coronary Angiography;  Surgeon: Burnell Blanks, MD;  Location: [facility];  Service: Cardiovascular;  Laterality: N/A;  . LEFT HEART CATHETERIZATION WITH CORONARY ANGIOGRAM N/A 10/27/2012   Procedure: LEFT HEART CATHETERIZATION WITH CORONARY ANGIOGRAM;  Surgeon: Burnell Blanks, MD;  Location: [facility];  Service: Cardiovascular;  Laterality: N/A;  . TOE AMPUTATION  2006; 2009   "Dr. Blenda Mounts; big toe left foot; little toe on right foot" (10/26/2012)  . TOE AMPUTATION Right 12/24/2015   2nd & 3 toes/notes 1/13//2017  . TOE AMPUTATION Right    5TH TOE  . 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 Authorizing Provider  acetaminophen (TYLENOL) 500 MG tablet Take 1,000 mg by mouth at bedtime.    Yes [provider]  albuterol (VENTOLIN HFA) 108 (90 Base) MCG/ACT inhaler Inhale 1-2 puffs into the lungs every 6 (six) hours as needed for wheezing or shortness of breath.   Yes [provider]  apixaban (ELIQUIS) 5 MG TABS tablet Take 1 tablet (5 mg total) by mouth 2 (two) times daily. 08/08/19  Yes Mercy Riding, MD  aspirin EC 81 MG tablet Take 81 mg by mouth daily.   Yes [provider]  Cholecalciferol (VITAMIN D-3) 25 MCG (1000 UT) CAPS Take 1,000 Units by mouth daily.   Yes [provider]  Fluticasone-Salmeterol (ADVAIR DISKUS) 100-50 MCG/DOSE AEPB Inhale 1 puff into the lungs 2 (two) times daily. 03/02/16  Yes Plotnikov, Evie Lacks, MD  gabapentin (NEURONTIN) 300 MG capsule TAKE 1 CAPSULE THREE TIMES DAILY Patient taking differently: Take 300 mg by mouth 2 (two) times  daily.  08/04/18  Yes Plotnikov, Evie Lacks, MD  insulin aspart (NOVOLOG) 100 UNIT/ML FlexPen Inject 20 Units into the skin 4 (four) times daily - after meals and at bedtime. Follow SS Patient taking differently: Inject 1-5 Units into the skin See admin instructions. Inject 1-5 units into the skin three to four times a day, per sliding scale 08/23/18  Yes Plotnikov, Evie Lacks, MD  isosorbide mononitrate (IMDUR) 60 MG 24 hr tablet TAKE 1 TABLET EVERY DAY Patient taking differently: Take 60 mg by mouth daily.  05/08/19  Yes Plotnikov, Evie Lacks, MD  magnesium oxide (MAG-OX) 400 (241.3 Mg) MG tablet Take 1 tablet (400 mg total) by mouth 2 (two) times daily. 08/09/19  Yes Mercy Riding, MD  metFORMIN (GLUCOPHAGE) 500 MG tablet TAKE 1 TABLET TWICE DAILY WITH A MEAL Patient taking differently: Take 500 mg by mouth 2 (two) times daily with a  meal.  05/08/19  Yes Plotnikov, Evie Lacks, MD  metoprolol tartrate (LOPRESSOR) 25 MG tablet Take 1 tablet (25 mg total) by mouth 2 (two) times daily. 08/04/18  Yes Plotnikov, Evie Lacks, MD  mupirocin ointment (BACTROBAN) 2 % Apply 1 application topically See admin instructions. Apply to affected areas of bilateral feet, ankles, and legs once daily    Yes [provider]  nitroGLYCERIN (NITROSTAT) 0.4 MG SL tablet DISSOLVE 1 TABLET (0.4 MG TOTAL) UNDER THE TONGUE EVERY 5 (FIVE) MINUTES AS NEEDED (UP TO 3 DOSES) FOR CHEST PAIN Patient taking differently: Place 0.4 mg under the tongue every 5 (five) minutes as needed for chest pain.  05/08/19  Yes Plotnikov, Evie Lacks, MD  pravastatin (PRAVACHOL) 40 MG tablet TAKE 1 TABLET EVERY DAY Patient taking differently: Take 40 mg by mouth at bedtime.  05/08/19  Yes Plotnikov, Evie Lacks, MD  PRESCRIPTION MEDICATION See admin instructions. CPAP- At bedtime   Yes [provider]  ranolazine (RANEXA) 500 MG 12 hr tablet TAKE 1 TABLET TWICE DAILY Patient taking differently: Take 500 mg by mouth 2 (two) times daily.  08/03/18   Yes Plotnikov, Evie Lacks, MD  torsemide (DEMADEX) 20 MG tablet Take 40 mg by mouth See admin instructions. Take 40 mg by mouth two times a day and additional 20 mg once daily until desired amount of output of urine is achieved   Yes [provider]  triamcinolone ointment (KENALOG) 0.5 % Apply 1 application topically 2 (two) times daily. Patient taking differently: Apply 1 application topically 2 (two) times daily as needed (for itching).  02/21/19 02/21/20 Yes Plotnikov, Evie Lacks, MD  vitamin B-12 (CYANOCOBALAMIN) 1000 MCG tablet Take 1,000 mcg by mouth daily.    Yes [provider]  collagenase (SANTYL) ointment Apply 1 application topically daily. 11/29/16   Trula Slade, DPM  doxycycline (VIBRA-TABS) 100 MG tablet Take 1 tablet (100 mg total) by mouth every 12 (twelve) hours. Patient not taking: Reported on 08/23/2019 08/08/19   Mercy Riding, MD  torsemide (DEMADEX) 100 MG tablet Take 1 tablet (100 mg total) by mouth daily. 08/21/19   Plotnikov, Evie Lacks, MD    Current Facility-Administered Medications  Medication Dose Route Frequency Provider Last Rate Last Dose  . cefTRIAXone (ROCEPHIN) 2 g in sodium chloride 0.9 % 100 mL IVPB  2 g Intravenous Q24H Varney Biles, MD   Stopped at 08/23/19 1906   Current Outpatient Medications  Medication Sig Dispense Refill  . acetaminophen (TYLENOL) 500 MG tablet Take 1,000 mg by mouth at bedtime.     Marland Kitchen albuterol (VENTOLIN HFA) 108 (90 Base) MCG/ACT inhaler Inhale 1-2 puffs into the lungs every 6 (six) hours as needed for wheezing or shortness of breath.    Marland Kitchen apixaban (ELIQUIS) 5 MG TABS tablet Take 1 tablet (5 mg total) by mouth 2 (two) times daily. 60 tablet 0  . aspirin EC 81 MG tablet Take 81 mg by mouth daily.    . Cholecalciferol (VITAMIN D-3) 25 MCG (1000 UT) CAPS Take 1,000 Units by mouth daily.    . Fluticasone-Salmeterol (ADVAIR DISKUS) 100-50 MCG/DOSE AEPB Inhale 1 puff into the lungs 2 (two) times daily. 3 each 3  .  gabapentin (NEURONTIN) 300 MG capsule TAKE 1 CAPSULE THREE TIMES DAILY (Patient taking differently: Take 300 mg by mouth 2 (two) times daily. ) 270 capsule 3  . insulin aspart (NOVOLOG) 100 UNIT/ML FlexPen Inject 20 Units into the skin 4 (four) times daily - after meals and  at bedtime. Follow SS (Patient taking differently: Inject 1-5 Units into the skin See admin instructions. Inject 1-5 units into the skin three to four times a day, per sliding scale) 15 pen 3  . isosorbide mononitrate (IMDUR) 60 MG 24 hr tablet TAKE 1 TABLET EVERY DAY (Patient taking differently: Take 60 mg by mouth daily. ) 90 tablet 3  . magnesium oxide (MAG-OX) 400 (241.3 Mg) MG tablet Take 1 tablet (400 mg total) by mouth 2 (two) times daily. 60 tablet 0  . metFORMIN (GLUCOPHAGE) 500 MG tablet TAKE 1 TABLET TWICE DAILY WITH A MEAL (Patient taking differently: Take 500 mg by mouth 2 (two) times daily with a meal. ) 180 tablet 3  . metoprolol tartrate (LOPRESSOR) 25 MG tablet Take 1 tablet (25 mg total) by mouth 2 (two) times daily. 180 tablet 3  . mupirocin ointment (BACTROBAN) 2 % Apply 1 application topically See admin instructions. Apply to affected areas of bilateral feet, ankles, and legs once daily     . nitroGLYCERIN (NITROSTAT) 0.4 MG SL tablet DISSOLVE 1 TABLET (0.4 MG TOTAL) UNDER THE TONGUE EVERY 5 (FIVE) MINUTES AS NEEDED (UP TO 3 DOSES) FOR CHEST PAIN (Patient taking differently: Place 0.4 mg under the tongue every 5 (five) minutes as needed for chest pain. ) 50 tablet 1  . pravastatin (PRAVACHOL) 40 MG tablet TAKE 1 TABLET EVERY DAY (Patient taking differently: Take 40 mg by mouth at bedtime. ) 90 tablet 3  . PRESCRIPTION MEDICATION See admin instructions. CPAP- At bedtime    . ranolazine (RANEXA) 500 MG 12 hr tablet TAKE 1 TABLET TWICE DAILY (Patient taking differently: Take 500 mg by mouth 2 (two) times daily. ) 180 tablet 3  . torsemide (DEMADEX) 20 MG tablet Take 40 mg by mouth See admin instructions. Take 40 mg by  mouth two times a day and additional 20 mg once daily until desired amount of output of urine is achieved    . triamcinolone ointment (KENALOG) 0.5 % Apply 1 application topically 2 (two) times daily. (Patient taking differently: Apply 1 application topically 2 (two) times daily as needed (for itching). ) 60 g 0  . vitamin B-12 (CYANOCOBALAMIN) 1000 MCG tablet Take 1,000 mcg by mouth daily.     . collagenase (SANTYL) ointment Apply 1 application topically daily. 15 g 3  . doxycycline (VIBRA-TABS) 100 MG tablet Take 1 tablet (100 mg total) by mouth every 12 (twelve) hours. (Patient not taking: Reported on 08/23/2019) 20 tablet 0  . torsemide (DEMADEX) 100 MG tablet Take 1 tablet (100 mg total) by mouth daily. 90 tablet 3    Allergies as of 08/23/2019 - Review Complete 08/23/2019  Allergen Reaction Noted  . Tolterodine Swelling and Other (See Comments) 08/21/2019  . Lipitor [atorvastatin] Other (See Comments) 08/16/2017  . Xarelto [rivaroxaban] Rash 02/13/2014    Family History  Problem Relation Age of Onset  . Breast cancer Mother 76  . Heart disease Father 103  . Asthma Neg Hx     Social History   Socioeconomic History  . Marital status: Married    Spouse name: Not on file  . Number of children: Not on file  . Years of education: Not on file  . Highest education level: Not on file  Occupational History  . Not on file  Social Needs  . Financial resource strain: Not on file  . Food insecurity    Worry: Not on file    Inability: Not on file  . Transportation needs  Medical: Not on file    Non-medical: Not on file  Tobacco Use  . Smoking status: Former Smoker    Packs/day: 1.00    Years: 30.00    Pack years: 30.00    Types: Cigarettes, Cigars  . Smokeless tobacco: Former Systems developer    Quit date: 08/15/2016  . Tobacco comment: 12/2016 quit smoking in 1998  Substance and Sexual Activity  . Alcohol use: Yes    Comment: 12/2016   rare   . Drug use: No  . Sexual activity: Yes   Lifestyle  . Physical activity    Days per week: Not on file    Minutes per session: Not on file  . Stress: Not on file  Relationships  . Social Herbalist on phone: Not on file    Gets together: Not on file    Attends religious service: Not on file    Active member of club or organization: Not on file    Attends meetings of clubs or organizations: Not on file    Relationship status: Not on file  . Intimate partner violence    Fear of current or ex partner: Not on file    Emotionally abused: Not on file    Physically abused: Not on file    Forced sexual activity: Not on file  Other Topics Concern  . Not on file  Social History Narrative   Disabled s/p CVA    Review of Systems: Gen: Fatigue weakness generally unwell HEENT: No visual complaints, No history of Retinopathy. Normal external appearance No Epistaxis or Sore throat. No sinusitis.   CV: Short of breath at rest lower extremity edema 3 pillow orthopnea Resp: Dyspnea at rest and to conversation GI: Denies vomiting blood, jaundice, and fecal incontinence.   Denies dysphagia or odynophagia. GU : Denies urinary burning, blood in urine, urinary frequency, urinary hesitancy, nocturnal urination, and urinary incontinence.  No renal calculi. MS: Denies joint pain, limitation of movement, and swelling, stiffness, low back pain, extremity pain. Denies muscle weakness, cramps, atrophy.  No use of non steroidal antiinflammatory drugs. Derm: Denies rash, itching, dry skin, hives, moles, warts, or unhealing ulcers.  Psych: Denies depression, anxiety, memory loss, suicidal ideation, hallucinations, paranoia, and confusion. Heme: Denies bruising, bleeding, and enlarged lymph nodes.  Anticoagulated with Eliquis Neuro: No headache.  No diplopia. No dysarthria.  No dysphasia.  History of CVA CVA.  No Seizures. No paresthesias.  No weakness. Endocrine history of diabetes  Physical Exam: Vital signs in last 24 hours: Temp:  [97.4  F (36.3 C)] 97.4 F (36.3 C) (09/10 1528) Pulse Rate:  [60-95] 64 (09/10 2120) Resp:  [16-23] 22 (09/10 2120) BP: (90-127)/(53-102) 127/102 (09/10 2120) SpO2:  [92 %-100 %] 100 % (09/10 2120) FiO2 (%):  [3 %] 3 % (09/10 1529) Weight:  [132.9 kg] 132.9 kg (09/10 1920)   General:   Alert elderly gentleman on BiPAP Head:  Normocephalic and atraumatic. Eyes:  Sclera clear, no icterus.   Conjunctiva pink. Ears:  Normal auditory acuity. Nose:  No deformity, discharge,  or lesions. Mouth:  No deformity or lesions, dentition normal. Neck:  Supple; no masses or thyromegaly.  JVP appears elevated Lungs: Diminished air entry throughout with no wheezes or crackles Heart:  Regular rate and rhythm; no murmurs, clicks, rubs,  or gallops. Abdomen:  Soft, nontender abdominal distention with hypoactive bowel sounds. Pulses:  No carotid, renal, femoral bruits. DP and PT symmetrical and equal Extremities: Lower extremity edema 3+ right  trans-met amputation Neurologic:  Alert and  oriented x4;  grossly normal neurologically. Skin:  Intact without significant lesions or rashes.    Intake/Output from previous day: No intake/output data recorded. Intake/Output this shift: Total I/O In: 100 [IV Piggyback:100] Out: -   Lab Results: Recent Labs    08/23/19 1655  WBC 12.3*  HGB 10.8*  HCT 33.4*  PLT 249   BMET Recent Labs    08/23/19 1655  NA 132*  K 5.4*  CL 88*  CO2 28  GLUCOSE 127*  BUN 79*  CREATININE 4.58*  CALCIUM 9.5   LFT Recent Labs    08/23/19 1655  PROT 6.4*  ALBUMIN 3.0*  AST 22  ALT 14  ALKPHOS 55  BILITOT 1.0   PT/INR Recent Labs    08/23/19 1845  LABPROT 25.0*  INR 2.3*   Hepatitis Panel No results for input(s): HEPBSAG, HCVAB, HEPAIGM, HEPBIGM in the last 72 hours.  Studies/Results: Dg Chest Port 1 View  Result Date: 08/23/2019 CLINICAL DATA:  Dyspnea EXAM: PORTABLE CHEST 1 VIEW COMPARISON:  06/01/2017 FINDINGS: Cardiomegaly. Mild pulmonary vascular  congestion. Calcific aortic knob. Right basilar opacity. No pneumothorax. Advanced degenerative changes of the shoulders. IMPRESSION: 1. Right basilar opacity which may reflect pleural effusion with atelectasis and/or pneumonia. 2. Cardiomegaly and pulmonary vascular congestion suggesting CHF. Electronically Signed   By: Davina Poke M.D.   On: 08/23/2019 17:12    Assessment/Plan:  Acute kidney injury.  Urine appears to have blood protein and WBCs.  Urine culture sent..  Will check renal ultrasound.  Patient appears to be hypotensive with anasarca.  This seems to be the driving force of his acute kidney injury.  Foley catheter should be placed in order to monitor his ins and outs.  Daily renal panel.  Avoid nephrotoxins.  Avoid ACE inhibitors ARB's.  Avoid contrast.  Avoid intra-arterial procedures.  Discontinue metformin.  Hypotension.  Recommend 2D echo.  Last 2D echo was consistent with diastolic dysfunction.  Would rule out pericardial effusion.  As well as heart failure with systolic dysfunction.  Recommend a.m. cortisol, blood cultures.  Urine cultures.  Will check troponins.  Will be difficult to obtain any degree of diuresis while patient remains so hypotensive.  Discontinue antihypertensive medications including Imdur.  Respiratory failure now on BiPAP.  Possibly consistent with pulmonary edema.  Differential would include pulmonary embolus.  As well as valvular heart disease.  Pneumonia.  Recommend transfer to intensive care unit for closer monitoring.  Atrial fibrillation continues on anticoagulation.  Anemia does not appear to be an issue at this particular time.  Diabetes mellitus per primary team  Coronary artery disease per primary team  Possible urinary tract infection being treated with Rocephin and doxycycline.   LOS: Natalia _0 _1 :11 PM

## 2019-08-23 NOTE — ED Provider Notes (Signed)
[facility] EMERGENCY DEPARTMENT Provider Note   CSN: [id number] Arrival date & time: 08/23/19  1527     History   Chief Complaint Chief Complaint  Patient presents with  . Weakness    HPI Charles [name] is a 78 y.o. male.     HPI 78 year old comes in a chief complaint of weakness.  Patient has history of CAD, CHF, COPD, diabetes.  He was recently admitted for Lasix overdose.  He reports that over the past few days his shortness of breath has progressed.  He is having now shortness of breath with minimal exertion.  He has baseline orthopnea but denies worsening of it.  He uses CPAP at night and denies any new PND.  Patient denies any new cough, fevers, chills, nausea, vomiting, UTI-like symptoms.   Past Medical History:  Diagnosis Date  . Arthritis   . B12 deficiency   . CAD (coronary artery disease)    a. Approx. 2000 - MI. Cath: single vsl dz, PTCA dLAD/medical rx;  b. NSTEMI 11/13 => IVUS attempted for RCA but not successful; anatomy felt stable from 2000 => med Rx. c. [place] 08/2016: severe stable diffuse dLAD, severe prox RCA -> PCI of RCA unsuccessful; d. 05/2017 NSTEMI/Cath: LM nl, LAD 20ost/p, 69m, 80d, LCX 30ost, 20p, 35m, OM2 90, RCA 80p, 99d.  . Cancer ([place])    skin cancer- head   . Cataracts   . Cholelithiasis   . Chronic diastolic CHF (congestive heart failure) ([place])    a. 05/2017 Echo: EF 60-65%, Gr2 DD, mildly dil Ao, triv MR, mildly to mod dil LA, mild PR.  . Claustrophobia   . COPD (chronic obstructive pulmonary disease) ([place])   . CVA (cerebral vascular accident) ([place]) ~ 2001   vision inparted from stroke.  Visual Memory loss  . Diabetic neuropathy ([place])   . Diverticulitis   . Dysrhythmia    if he does not take Metoprolol- Afib  . ED (erectile dysfunction)   . Essential hypertension   . History of MRSA infection    Right foot  . History of stomach ulcers   . HOH (hard of hearing)   . Hypercholesterolemia   . Legally blind   . Lower  extremity edema   . Morbid obesity ([place])   . Neuropathy   . NSTEMI (non-ST elevated myocardial infarction) ([place]) 05/2017  . OSA on CPAP   . PAF (paroxysmal atrial fibrillation) ([name])    a. confirmed by event monitor. b. 02/2014 rash on Coumadin, patient decided to discontinue Xarelto due to possible rash, cost and lawyers ads on TV, agreed to take Plavix.  . Pneumonia 2016  . Tunnel vision    Since stroke  . Type II diabetes mellitus ([place])   . Venous stasis   . Weakness 01/11/2017    Patient Active Problem List   Diagnosis Date Noted  . Overdose 08/03/2019  . Urinary incontinence 05/25/2019  . Dysarthria 02/21/2019  . Humerus fracture 08/23/2018  . Chronic gingivitis 08/16/2017  . NSTEMI (non-ST elevated myocardial infarction) ([place]) 06/01/2017  . Scaly skin 05/16/2017  . C. difficile colitis 01/31/2017  . PAT (paroxysmal atrial tachycardia) ([place]) 01/15/2017  . HLD (hyperlipidemia) 01/15/2017  . Hypoglycemia 01/11/2017  . Demand ischemia of myocardium ([place])   . Diarrhea 01/10/2017  . Elevated troponin 01/10/2017  . Dehydration 01/10/2017  . Hypoglycemia due to type 2 diabetes mellitus ([place]) 01/10/2017  . Hypoglycemia associated with diabetes ([place]) 10/17/2016  . Chronic ulcer of left foot ([place])  08/31/2016  . Acute on chronic diastolic congestive heart failure ([place]) 08/20/2016  . Venous stasis   . Lower extremity edema   . Hypercholesterolemia   . Diabetic neuropathy ([place])   . Unstable angina ([place]) 08/15/2016  . 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 Pressure ulcer 12/27/2015  . Gangrene of toe ([place]) 12/26/2015  . Acute renal failure (ARF) ([place]) 12/26/2015  . Subconjunctival hemorrhage 10/21/2015  . Cellulitis in diabetic foot ([place])   . Cellulitis 05/06/2015  . COPD exacerbation ([place]) 10/10/2014  . Hematuria 08/12/2014  . Hypokalemia 08/12/2014  . Urinary retention 08/03/2014  . Hypotension 07/26/2014  . COPD  07/26/2014  . Rash and nonspecific skin eruption 02/13/2014   . PAF (paroxysmal atrial fibrillation) ([place]) 11/13/2013  . Morbid obesity ([place]) 05/24/2012  . Type 2 diabetes, uncontrolled, with retinopathy ([place]) 02/09/2012  . NEOPLASM OF UNCERTAIN BEHAVIOR OF SKIN 12/24/2010  . WEIGHT GAIN, ABNORMAL 02/02/2010  . TOBACCO USE, QUIT 02/02/2010  . 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 HYPOGONADISM 07/12/2007  . B12 deficiency 07/12/2007  . Essential hypertension 07/12/2007  . MYOCARDIAL INFARCTION, HX OF 07/12/2007  . CAD (coronary artery disease) 07/12/2007  . History of CVA (cerebrovascular accident) 07/12/2007  . VENOUS INSUFFICIENCY, LEGS 07/12/2007  . SYMPTOM, MEMORY LOSS 07/12/2007  . DIVERTICULITIS, HX OF 07/12/2007    Past Surgical History:  Procedure Laterality Date  . CARDIAC CATHETERIZATION  1990's  . CARDIAC CATHETERIZATION N/A 08/17/2016   Procedure: Left Heart Cath and Coronary Angiography;  Surgeon: Peter M Martinique, MD;  Location: [facility];  Service: Cardiovascular;  Laterality: N/A;  . CARDIAC CATHETERIZATION N/A 08/19/2016   Procedure: Coronary Balloon Angioplasty;  Surgeon: Peter M Martinique, MD;  Location: [facility];  Service: Cardiovascular;  Laterality: N/A;  . CATARACT EXTRACTION  10/2016  . CEREBRAL ANGIOGRAM  ~ 2000  . COLONOSCOPY    . LEFT HEART CATH AND CORONARY ANGIOGRAPHY N/A 06/01/2017   Procedure: Left Heart Cath and Coronary Angiography;  Surgeon: Burnell Blanks, MD;  Location: [facility];  Service: Cardiovascular;  Laterality: N/A;  . LEFT HEART CATHETERIZATION WITH CORONARY ANGIOGRAM N/A 10/27/2012   Procedure: LEFT HEART CATHETERIZATION WITH CORONARY ANGIOGRAM;  Surgeon: Burnell Blanks, MD;  Location: [facility] CATH LAB;  Service: Cardiovascular;  Laterality: N/A;  . TOE AMPUTATION  2006; 2009   "Dr. Blenda Mounts; big toe left foot; little toe on right foot" (10/26/2012)  . TOE AMPUTATION Right 12/24/2015   2nd & 3 toes/notes 1/13//2017  . TOE AMPUTATION Right    5TH  TOE  . 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 Authorizing Provider  acetaminophen (TYLENOL) 500 MG tablet Take 1,000 mg by mouth at bedtime.     [provider]  albuterol (VENTOLIN HFA) 108 (90 Base) MCG/ACT inhaler Inhale 1-2 puffs into the lungs every 6 (six) hours as needed for wheezing or shortness of breath.    [provider]  apixaban (ELIQUIS) 5 MG TABS tablet Take 1 tablet (5 mg total) by mouth 2 (two) times daily. 08/08/19   Mercy Riding, MD  cholecalciferol (VITAMIN D) 1000 units tablet Take 1,000 Units by mouth daily.     [provider]  collagenase (SANTYL) ointment  Apply 1 application topically daily. 11/29/16   Trula Slade, DPM  doxycycline (VIBRA-TABS) 100 MG tablet Take 1 tablet (100 mg total) by mouth every 12 (twelve) hours. 08/08/19   Mercy Riding, MD  Fluticasone-Salmeterol (ADVAIR DISKUS) 100-50 MCG/DOSE AEPB Inhale 1 puff into the lungs 2 (two) times daily. 03/02/16   Plotnikov, Evie Lacks, MD  gabapentin (NEURONTIN) 300 MG capsule TAKE 1 CAPSULE THREE TIMES DAILY Patient taking differently: Take 300 mg by mouth 2 (two) times daily.  08/04/18   Plotnikov, Evie Lacks, MD  insulin aspart (NOVOLOG) 100 UNIT/ML FlexPen Inject 20 Units into the skin 4 (four) times daily - after meals and at bedtime. Follow SS Patient taking differently: Inject 0-20 Units into the skin See admin instructions. Inject #units after meals and at bedtime depending on sliding scale 08/23/18   Plotnikov, Evie Lacks, MD  isosorbide mononitrate (IMDUR) 60 MG 24 hr tablet TAKE 1 TABLET EVERY DAY Patient taking differently: Take 60 mg by mouth daily.  05/08/19   Plotnikov, Evie Lacks, MD  magnesium oxide (MAG-OX) 400 (241.3 Mg) MG tablet  Take 1 tablet (400 mg total) by mouth 2 (two) times daily. 08/09/19   Mercy Riding, MD  metFORMIN (GLUCOPHAGE) 500 MG tablet TAKE 1 TABLET TWICE DAILY WITH A MEAL Patient taking differently: Take 500 mg by mouth 2 (two) times daily with a meal.  05/08/19   Plotnikov, Evie Lacks, MD  metoprolol tartrate (LOPRESSOR) 25 MG tablet Take 1 tablet (25 mg total) by mouth 2 (two) times daily. 08/04/18   Plotnikov, Evie Lacks, MD  nitroGLYCERIN (NITROSTAT) 0.4 MG SL tablet DISSOLVE 1 TABLET (0.4 MG TOTAL) UNDER THE TONGUE EVERY 5 (FIVE) MINUTES AS NEEDED (UP TO 3 DOSES) FOR CHEST PAIN Patient taking differently: Place 0.4 mg under the tongue every 5 (five) minutes as needed for chest pain.  05/08/19   Plotnikov, Evie Lacks, MD  pravastatin (PRAVACHOL) 40 MG tablet TAKE 1 TABLET EVERY DAY Patient taking differently: Take 40 mg by mouth daily.  05/08/19   Plotnikov, Evie Lacks, MD  ranolazine (RANEXA) 500 MG 12 hr tablet TAKE 1 TABLET TWICE DAILY Patient taking differently: Take 500 mg by mouth 2 (two) times daily.  08/03/18   Plotnikov, Evie Lacks, MD  torsemide (DEMADEX) 100 MG tablet Take 1 tablet (100 mg total) by mouth daily. 08/21/19   Plotnikov, Evie Lacks, MD  triamcinolone ointment (KENALOG) 0.5 % Apply 1 application topically 2 (two) times daily. 02/21/19 02/21/20  Plotnikov, Evie Lacks, MD  vitamin B-12 (CYANOCOBALAMIN) 1000 MCG tablet Take 1,000 mcg by mouth daily.     [provider]    Family History Family History  Problem Relation Age of Onset  . Breast cancer Mother 67  . Heart disease Father 51  . Asthma Neg Hx     Social History Social History   Tobacco Use  . Smoking status: Former Smoker    Packs/day: 1.00    Years: 30.00    Pack years: 30.00    Types: Cigarettes, Cigars  . Smokeless tobacco: Former Systems developer    Quit date: 08/15/2016  . Tobacco comment: 12/2016 quit smoking in 1998  Substance Use Topics  . Alcohol use: Yes    Comment: 12/2016   rare   . Drug use: No     Allergies    Actos [pioglitazone], Lipitor [atorvastatin], Tolterodine, and Xarelto [rivaroxaban]   Review of Systems Review of Systems  Constitutional: Positive for activity change.  Respiratory: Positive for shortness of  breath.   Cardiovascular: Negative for chest pain.  Gastrointestinal: Negative for abdominal pain.  Allergic/Immunologic: Negative for immunocompromised state.  Hematological: Does not bruise/bleed easily.  All other systems reviewed and are negative.    Physical Exam Updated Vital Signs BP 96/79   Pulse 88   Temp (!) 97.4 F (36.3 C) (Oral) Comment: Simultaneous filing. User may not have seen previous data. Comment (Src): Simultaneous filing. User may not have seen previous data.  Resp (!) 23   Ht 6\' 4"  (1.93 m)   Wt 132.9 kg   SpO2 100%   BMI 35.67 kg/m   Physical Exam Vitals signs and nursing note reviewed.  Constitutional:      Appearance: He is well-developed.  HENT:     Head: Normocephalic and atraumatic.  Eyes:     Conjunctiva/sclera: Conjunctivae normal.     Pupils: Pupils are equal, round, and reactive to light.  Neck:     Musculoskeletal: Normal range of motion and neck supple.  Cardiovascular:     Rate and Rhythm: Normal rate and regular rhythm.  Pulmonary:     Effort: Pulmonary effort is normal.     Breath sounds: Normal breath sounds. No wheezing or rhonchi.     Comments: Diminished breath sounds diffusely. Abdominal:     General: Bowel sounds are normal. There is no distension.     Palpations: Abdomen is soft. There is no mass.     Tenderness: There is no abdominal tenderness. There is no guarding or rebound.  Musculoskeletal:        General: Swelling present. No deformity.     Right lower leg: Edema present.     Left lower leg: Edema present.  Skin:    General: Skin is warm.  Neurological:     Mental Status: He is alert and oriented to person, place, and time.      ED Treatments / Results  Labs (all labs ordered are listed, but only  abnormal results are displayed) Labs Reviewed  COMPREHENSIVE METABOLIC PANEL - Abnormal; Notable for the following components:      Result Value   Sodium 132 (*)    Potassium 5.4 (*)    Chloride 88 (*)    Glucose, Bld 127 (*)    BUN 79 (*)    Creatinine, Ser 4.58 (*)    Total Protein 6.4 (*)    Albumin 3.0 (*)    GFR calc non Af Amer 11 (*)    GFR calc Af Amer 13 (*)    Anion gap 16 (*)    All other components within normal limits  CBC WITH DIFFERENTIAL/PLATELET - Abnormal; Notable for the following components:   WBC 12.3 (*)    RBC 3.59 (*)    Hemoglobin 10.8 (*)    HCT 33.4 (*)    RDW 15.9 (*)    Neutro Abs 11.0 (*)    Lymphs Abs 0.4 (*)    Abs Immature Granulocytes 0.14 (*)    All other components within normal limits  BRAIN NATRIURETIC PEPTIDE - Abnormal; Notable for the following components:   B Natriuretic Peptide 1,871.6 (*)    All other components within normal limits  LACTIC ACID, PLASMA - Abnormal; Notable for the following components:   Lactic Acid, Venous 3.0 (*)    All other components within normal limits  APTT - Abnormal; Notable for the following components:   aPTT 41 (*)    All other components within normal limits  PROTIME-INR - Abnormal; Notable for  the following components:   Prothrombin Time 25.0 (*)    INR 2.3 (*)    All other components within normal limits  URINALYSIS, ROUTINE W REFLEX MICROSCOPIC - Abnormal; Notable for the following components:   Color, Urine AMBER (*)    APPearance HAZY (*)    Hgb urine dipstick MODERATE (*)    Protein, ur 100 (*)    Leukocytes,Ua SMALL (*)    RBC / HPF >50 (*)    WBC, UA >50 (*)    Bacteria, UA FEW (*)    All other components within normal limits  SARS CORONAVIRUS 2 (HOSPITAL ORDER, [facility])  CULTURE, BLOOD (ROUTINE X 2)  CULTURE, BLOOD (ROUTINE X 2)  URINE CULTURE  CREATININE, URINE, RANDOM  LACTIC ACID, PLASMA  UREA NITROGEN, URINE    EKG EKG Interpretation   Date/Time:  Thursday August 23 2019 15:27:59 EDT Ventricular Rate:  109 PR Interval:    QRS Duration: 142 QT Interval:  410 QTC Calculation: 550 R Axis:   129 Text Interpretation:  Ectopic atrial tachycardia, unifocal Multiple premature complexes, vent & supraven Nonspecific intraventricular conduction delay Inferior infarct, age indeterminate Consider anterolateral infarct No acute changes Confirmed by Varney Biles [id number]) on 08/23/2019 3:31:34 PM   Radiology Dg Chest Port 1 View  Result Date: 08/23/2019 CLINICAL DATA:  Dyspnea EXAM: PORTABLE CHEST 1 VIEW COMPARISON:  06/01/2017 FINDINGS: Cardiomegaly. Mild pulmonary vascular congestion. Calcific aortic knob. Right basilar opacity. No pneumothorax. Advanced degenerative changes of the shoulders. IMPRESSION: 1. Right basilar opacity which may reflect pleural effusion with atelectasis and/or pneumonia. 2. Cardiomegaly and pulmonary vascular congestion suggesting CHF. Electronically Signed   By: Davina Poke M.D.   On: 08/23/2019 17:12    Procedures .Critical Care Performed by: Varney Biles, MD Authorized by: Varney Biles, MD   Critical care provider statement:    Critical care time (minutes):  45   Critical care time was exclusive of:  Separately billable procedures and treating other patients   Critical care was necessary to treat or prevent imminent or life-threatening deterioration of the following conditions:  Cardiac failure, circulatory failure and renal failure   Critical care was time spent personally by me on the following activities:  Discussions with consultants, evaluation of patient's response to treatment, examination of patient, ordering and performing treatments and interventions, ordering and review of laboratory studies, ordering and review of radiographic studies, pulse oximetry, re-evaluation of patient's condition, obtaining history from patient or surrogate, review of old charts and development of  treatment plan with patient or surrogate   I assumed direction of critical care for this patient from another provider in my specialty: yes     (including critical care time)  Medications Ordered in ED Medications  cefTRIAXone (ROCEPHIN) 2 g in sodium chloride 0.9 % 100 mL IVPB (0 g Intravenous Stopped 08/23/19 1906)  doxycycline (VIBRAMYCIN) 100 mg in sodium chloride 0.9 % 250 mL IVPB (100 mg Intravenous New Bag/Given 08/23/19 1748)     Initial Impression / Assessment and Plan / ED Course  I have reviewed the triage vital signs and the nursing notes.  Pertinent labs & imaging results that were available during my care of the patient were reviewed by me and considered in my medical decision making (see chart for details).       78 year old with history of CAD, CHF, diabetes comes in with chief complaint of worsening shortness of breath.  He was recently admitted to the hospital for overdose on  Lasix and was changed to torsemide prior to discharge.  He complains of worsening shortness of breath, now he gets winded with minimal exertion.  On exam he has poor aeration diffusely.  He has history of COPD.  He also has significant pitting edema in both of his legs.  Differential diagnosis includes CHF, COPD exacerbation, pneumonia, pleural effusion.  Initial work-up reveals that patient has significant consolidation in the right lower lobe.  He denies any cough, but his urine is also showing possible infection therefore we will start him on ceftriaxone and doxycycline.  However, it appears that he is likely volume overloaded especially in the setting of elevated BNP.  Labs also revealed that patient has AKI, and a high BUN to creatinine ratio.  We will get urine creatinine, urine urea to calculate FE urea -we are working diagnosis is that he has prerenal CHF, and would benefit with further diuresing.  What complicates the matter is that possibly there is underlying infection and also his blood  pressure is in the 0000000 systolic.  I discussed the case with Dr. Hal Hope and informed him we are keeping him euvolemic until we get more information.  He requested that we speak with critical care, and the [facility] team does not think patient needs to be in ICU.  Dr. Hal Hope made aware of ICU call.  Stable for admission at this time.  He will require stepdown ICU.  In the ED, we will get a hold of on Lasix or fluids, until we get FE urea calculated.  I will attempt to go and check his IVC to get more info as well.  The patient is noted to have a MAP's <65/ SBP's <90. With the current information available to me, I don't think the patient is in septic shock. The MAP's <65/ SBP's <90, is related to an acute condition that is not due to an infection- CHF.      Final Clinical Impressions(s) / ED Diagnoses   Final diagnoses:  AKI (acute kidney injury) ([place])  Acute cystitis with hematuria  Lactic acidosis    ED Discharge Orders    None       Varney Biles, MD 08/23/19 1952

## 2019-08-23 NOTE — ED Notes (Signed)
Attempted to call Joaquim Lai x2 no answer.

## 2019-08-24 ENCOUNTER — Inpatient Hospital Stay (HOSPITAL_COMMUNITY): Payer: Medicare Other

## 2019-08-24 DIAGNOSIS — N179 Acute kidney failure, unspecified: Secondary | ICD-10-CM

## 2019-08-24 DIAGNOSIS — E872 Acidosis, unspecified: Secondary | ICD-10-CM

## 2019-08-24 DIAGNOSIS — R579 Shock, unspecified: Secondary | ICD-10-CM

## 2019-08-24 DIAGNOSIS — I361 Nonrheumatic tricuspid (valve) insufficiency: Secondary | ICD-10-CM

## 2019-08-24 LAB — COMPREHENSIVE METABOLIC PANEL
ALT: 14 U/L (ref 0–44)
AST: 29 U/L (ref 15–41)
Albumin: 3.1 g/dL — ABNORMAL LOW (ref 3.5–5.0)
Alkaline Phosphatase: 50 U/L (ref 38–126)
Anion gap: 16 — ABNORMAL HIGH (ref 5–15)
BUN: 89 mg/dL — ABNORMAL HIGH (ref 8–23)
CO2: 26 mmol/L (ref 22–32)
Calcium: 9.3 mg/dL (ref 8.9–10.3)
Chloride: 89 mmol/L — ABNORMAL LOW (ref 98–111)
Creatinine, Ser: 5.05 mg/dL — ABNORMAL HIGH (ref 0.61–1.24)
GFR calc Af Amer: 12 mL/min — ABNORMAL LOW (ref >60)
GFR calc non Af Amer: 10 mL/min — ABNORMAL LOW (ref >60)
Glucose, Bld: 105 mg/dL — ABNORMAL HIGH (ref 70–99)
Potassium: 6.8 mmol/L (ref 3.5–5.1)
Sodium: 131 mmol/L — ABNORMAL LOW (ref 135–145)
Total Bilirubin: 1.2 mg/dL (ref 0.3–1.2)
Total Protein: 6.3 g/dL — ABNORMAL LOW (ref 6.5–8.1)

## 2019-08-24 LAB — CBC WITH DIFFERENTIAL/PLATELET
Abs Immature Granulocytes: 0.14 10*3/uL — ABNORMAL HIGH (ref 0.00–0.07)
Basophils Absolute: 0 10*3/uL (ref 0.0–0.1)
Basophils Relative: 0 %
Eosinophils Absolute: 0 10*3/uL (ref 0.0–0.5)
Eosinophils Relative: 0 %
HCT: 33.7 % — ABNORMAL LOW (ref 39.0–52.0)
Hemoglobin: 10.5 g/dL — ABNORMAL LOW (ref 13.0–17.0)
Immature Granulocytes: 1 %
Lymphocytes Relative: 4 %
Lymphs Abs: 0.5 10*3/uL — ABNORMAL LOW (ref 0.7–4.0)
MCH: 29.8 pg (ref 26.0–34.0)
MCHC: 31.2 g/dL (ref 30.0–36.0)
MCV: 95.7 fL (ref 80.0–100.0)
Monocytes Absolute: 0.8 10*3/uL (ref 0.1–1.0)
Monocytes Relative: 7 %
Neutro Abs: 11 10*3/uL — ABNORMAL HIGH (ref 1.7–7.7)
Neutrophils Relative %: 88 %
Platelets: 237 10*3/uL (ref 150–400)
RBC: 3.52 MIL/uL — ABNORMAL LOW (ref 4.22–5.81)
RDW: 16.4 % — ABNORMAL HIGH (ref 11.5–15.5)
WBC: 12.4 10*3/uL — ABNORMAL HIGH (ref 4.0–10.5)
nRBC: 0 % (ref 0.0–0.2)

## 2019-08-24 LAB — BASIC METABOLIC PANEL
Anion gap: 14 (ref 5–15)
BUN: 93 mg/dL — ABNORMAL HIGH (ref 8–23)
CO2: 28 mmol/L (ref 22–32)
Calcium: 8.9 mg/dL (ref 8.9–10.3)
Chloride: 90 mmol/L — ABNORMAL LOW (ref 98–111)
Creatinine, Ser: 5.12 mg/dL — ABNORMAL HIGH (ref 0.61–1.24)
GFR calc Af Amer: 12 mL/min — ABNORMAL LOW (ref >60)
GFR calc non Af Amer: 10 mL/min — ABNORMAL LOW (ref >60)
Glucose, Bld: 115 mg/dL — ABNORMAL HIGH (ref 70–99)
Potassium: 5.4 mmol/L — ABNORMAL HIGH (ref 3.5–5.1)
Sodium: 132 mmol/L — ABNORMAL LOW (ref 135–145)

## 2019-08-24 LAB — POCT I-STAT 7, (LYTES, BLD GAS, ICA,H+H)
Acid-Base Excess: 7 mmol/L — ABNORMAL HIGH (ref 0.0–2.0)
Bicarbonate: 30.9 mmol/L — ABNORMAL HIGH (ref 20.0–28.0)
Calcium, Ion: 1.12 mmol/L — ABNORMAL LOW (ref 1.15–1.40)
HCT: 32 % — ABNORMAL LOW (ref 39.0–52.0)
Hemoglobin: 10.9 g/dL — ABNORMAL LOW (ref 13.0–17.0)
O2 Saturation: 67 %
Potassium: 5.4 mmol/L — ABNORMAL HIGH (ref 3.5–5.1)
Sodium: 129 mmol/L — ABNORMAL LOW (ref 135–145)
TCO2: 32 mmol/L (ref 22–32)
pCO2 arterial: 38.7 mmHg (ref 32.0–48.0)
pH, Arterial: 7.511 — ABNORMAL HIGH (ref 7.350–7.450)
pO2, Arterial: 32 mmHg — CL (ref 83.0–108.0)

## 2019-08-24 LAB — ECHOCARDIOGRAM COMPLETE
Height: 76 in
Weight: 4688 oz

## 2019-08-24 LAB — TROPONIN I (HIGH SENSITIVITY)
Troponin I (High Sensitivity): 45 ng/L — ABNORMAL HIGH (ref <18)
Troponin I (High Sensitivity): 50 ng/L — ABNORMAL HIGH (ref <18)

## 2019-08-24 LAB — MAGNESIUM: Magnesium: 2 mg/dL (ref 1.7–2.4)

## 2019-08-24 LAB — PHOSPHORUS: Phosphorus: 5.7 mg/dL — ABNORMAL HIGH (ref 2.5–4.6)

## 2019-08-24 LAB — LACTIC ACID, PLASMA: Lactic Acid, Venous: 2.4 mmol/L (ref 0.5–1.9)

## 2019-08-24 LAB — MRSA PCR SCREENING: MRSA by PCR: POSITIVE — AB

## 2019-08-24 LAB — PROCALCITONIN: Procalcitonin: <0.1 ng/mL

## 2019-08-24 LAB — SALICYLATE LEVEL: Salicylate Lvl: <7 mg/dL (ref 2.8–30.0)

## 2019-08-24 LAB — HEPARIN LEVEL (UNFRACTIONATED): Heparin Unfractionated: >2.2 IU/mL — ABNORMAL HIGH (ref 0.30–0.70)

## 2019-08-24 LAB — APTT: aPTT: >200 seconds (ref 24–36)

## 2019-08-24 LAB — STREP PNEUMONIAE URINARY ANTIGEN: Strep Pneumo Urinary Antigen: NEGATIVE

## 2019-08-24 LAB — GLUCOSE, CAPILLARY
Glucose-Capillary: 101 mg/dL — ABNORMAL HIGH (ref 70–99)
Glucose-Capillary: 97 mg/dL (ref 70–99)
Glucose-Capillary: 98 mg/dL (ref 70–99)

## 2019-08-24 LAB — CK: Total CK: 61 U/L (ref 49–397)

## 2019-08-24 LAB — CORTISOL: Cortisol, Plasma: 27.9 ug/dL

## 2019-08-24 LAB — CBG MONITORING, ED
Glucose-Capillary: 95 mg/dL (ref 70–99)
Glucose-Capillary: 98 mg/dL (ref 70–99)
Glucose-Capillary: 98 mg/dL (ref 70–99)

## 2019-08-24 LAB — UREA NITROGEN, URINE: Urea Nitrogen, Ur: 245 mg/dL

## 2019-08-24 MED ORDER — HEPARIN SODIUM (PORCINE) 1000 UNIT/ML DIALYSIS
1000.0000 [IU] | INTRAMUSCULAR | Status: DC | PRN
  Filled 2019-08-24: qty 6

## 2019-08-24 MED ORDER — SODIUM CHLORIDE 0.9 % IV SOLN: 250.0000 mL | INTRAVENOUS | Status: DC

## 2019-08-24 MED ORDER — HEPARIN (PORCINE) 25000 UT/250ML-% IV SOLN
1700.0000 [IU]/h | INTRAVENOUS | Status: DC
  Administered 2019-08-24: 05:00:00 1700 [IU]/h via INTRAVENOUS
  Filled 2019-08-24: qty 250

## 2019-08-24 MED ORDER — INSULIN ASPART 100 UNIT/ML ~~LOC~~ SOLN: 10.0000 [IU] | Freq: Once | SUBCUTANEOUS | Status: DC

## 2019-08-24 MED ORDER — SODIUM POLYSTYRENE SULFONATE 15 GM/60ML PO SUSP: 30.0000 g | Freq: Once | ORAL | Status: DC

## 2019-08-24 MED ORDER — VANCOMYCIN HCL 10 G IV SOLR
1250.0000 mg | INTRAVENOUS | Status: DC
  Administered 2019-08-25 – 2019-08-26 (×2): 1250 mg via INTRAVENOUS
  Filled 2019-08-24 (×4): qty 1250

## 2019-08-24 MED ORDER — VANCOMYCIN HCL 10 G IV SOLR
2000.0000 mg | Freq: Once | INTRAVENOUS | Status: AC
  Administered 2019-08-24: 17:00:00 2000 mg via INTRAVENOUS
  Filled 2019-08-24: qty 2000

## 2019-08-24 MED ORDER — SODIUM CHLORIDE 0.9 % IV SOLN
2.0000 g | Freq: Two times a day (BID) | INTRAVENOUS | Status: DC
  Administered 2019-08-24 – 2019-08-26 (×5): 2 g via INTRAVENOUS
  Filled 2019-08-24 (×6): qty 2

## 2019-08-24 MED ORDER — PRISMASOL BGK 4/2.5 32-4-2.5 MEQ/L IV SOLN
INTRAVENOUS | Status: DC
  Administered 2019-08-25 – 2019-08-26 (×10): via INTRAVENOUS_CENTRAL
  Filled 2019-08-24 (×17): qty 5000

## 2019-08-24 MED ORDER — PRISMASOL BGK 4/2.5 32-4-2.5 MEQ/L REPLACEMENT SOLN
Status: DC
  Administered 2019-08-24 – 2019-08-25 (×2): via INTRAVENOUS_CENTRAL
  Filled 2019-08-24 (×3): qty 5000

## 2019-08-24 MED ORDER — NOREPINEPHRINE 4 MG/250ML-% IV SOLN
2.0000 ug/min | INTRAVENOUS | Status: DC
  Administered 2019-08-24: 2 ug/min via INTRAVENOUS
  Filled 2019-08-24: qty 250

## 2019-08-24 MED ORDER — HEPARIN BOLUS VIA INFUSION (CRRT)
1000.0000 [IU] | INTRAVENOUS | Status: DC | PRN
  Filled 2019-08-24: qty 1000

## 2019-08-24 MED ORDER — VANCOMYCIN VARIABLE DOSE PER UNSTABLE RENAL FUNCTION (PHARMACIST DOSING): Status: DC

## 2019-08-24 MED ORDER — PERFLUTREN LIPID MICROSPHERE
1.0000 mL | INTRAVENOUS | Status: AC | PRN
  Administered 2019-08-24: 5 mL via INTRAVENOUS
  Filled 2019-08-24: qty 10

## 2019-08-24 MED ORDER — SODIUM CHLORIDE 0.9 % IV SOLN
INTRAVENOUS | Status: DC
  Administered 2019-08-24 – 2019-09-06 (×4): via INTRAVENOUS

## 2019-08-24 MED ORDER — DEXTROSE 50 % IV SOLN: 1.0000 | Freq: Once | INTRAVENOUS | Status: DC

## 2019-08-24 MED ORDER — SODIUM CHLORIDE 0.9 % IV SOLN
250.0000 [IU]/h | INTRAVENOUS | Status: DC
  Filled 2019-08-24: qty 2

## 2019-08-24 MED ORDER — HEPARIN (PORCINE) 2000 UNITS/L FOR CRRT
INTRAVENOUS_CENTRAL | Status: DC | PRN
  Filled 2019-08-24: qty 1000

## 2019-08-24 MED ORDER — HEPARIN (PORCINE) 25000 UT/250ML-% IV SOLN
1450.0000 [IU]/h | INTRAVENOUS | Status: DC
  Administered 2019-08-24: 1450 [IU]/h via INTRAVENOUS
  Filled 2019-08-24: qty 250

## 2019-08-24 MED ORDER — DEXTROSE 5 % IV SOLN
500.0000 mg | INTRAVENOUS | Status: DC
  Administered 2019-08-24: 500 mg via INTRAVENOUS
  Filled 2019-08-24 (×2): qty 0.5

## 2019-08-24 MED ORDER — PRISMASOL BGK 4/2.5 32-4-2.5 MEQ/L REPLACEMENT SOLN
Status: DC
  Administered 2019-08-24 – 2019-08-26 (×3): via INTRAVENOUS_CENTRAL
  Filled 2019-08-24 (×5): qty 5000

## 2019-08-24 MED ORDER — CALCIUM GLUCONATE 10 % IV SOLN: 1.0000 g | Freq: Once | INTRAVENOUS | Status: DC

## 2019-08-24 MED ORDER — CALCIUM GLUCONATE-NACL 1-0.675 GM/50ML-% IV SOLN: 1.0000 g | Freq: Once | INTRAVENOUS | Status: DC

## 2019-08-24 MED ORDER — CHLORHEXIDINE GLUCONATE CLOTH 2 % EX PADS
6.0000 | MEDICATED_PAD | Freq: Every day | CUTANEOUS | Status: DC
  Administered 2019-08-24 – 2019-08-26 (×2): 6 via TOPICAL

## 2019-08-24 MED ORDER — SODIUM CHLORIDE 0.9 % IV BOLUS
500.0000 mL | Freq: Once | INTRAVENOUS | Status: AC
  Administered 2019-08-24: 500 mL via INTRAVENOUS

## 2019-08-24 MED ORDER — INSULIN ASPART 100 UNIT/ML IV SOLN: 10.0000 [IU] | Freq: Once | INTRAVENOUS | Status: DC

## 2019-08-24 NOTE — Progress Notes (Signed)
Attempted radial A line- patient too shaky and vessel too calcified.  If arterial line absolutely needed at later time could consider femoral placement.  Held off for now.  Erskine Emery MD

## 2019-08-24 NOTE — Progress Notes (Addendum)
Pharmacy Antibiotic Note  Charles [name] is a 78 y.o. male admitted on 08/23/2019 with a wound infection and bacteremia.  Pharmacy has been consulted for Vancomycin dosing and cefepime dosing.  Patient with an AKI - serum Creatinine 5.05.  Dosed based on this clearance although notes mention that patient may need CRRT, doses will need to be adjusted if/when CRRT is initiated.  Plan: Vanc 2 grams IV x 1 Vancomycin variable dosing based on changing renal function - pharmacy will redose as needed Cefepime 500 mg IV q24hr Monitor renal function, dialysis, C&S, clinical status and vanc levels as needed  Addendum: (CRRT initiated) Increase Cefepime to 2 gms IV q12hr Vancomycin still give 2 gram load, then 1250 mg IV q24hr Will monitor as above   Height: 6\' 4"  (193 cm) Weight: 279 lb 8.7 oz (126.8 kg) IBW/kg (Calculated) : 86.8  No data recorded.  Recent Labs  Lab 08/23/19 1655 08/23/19 1721 08/23/19 2005 08/24/19 1025 08/24/19 1041  WBC 12.3*  --   --  12.4*  --   CREATININE 4.58*  --   --  5.05*  --   LATICACIDVEN  --  3.0* 3.1*  --  2.4*    Estimated Creatinine Clearance: 17.8 mL/min (A) (by C-G formula based on SCr of 5.05 mg/dL (H)).    Allergies  Allergen Reactions  . Tolterodine Swelling and Other (See Comments)    (Detrol) Dry mouth/dry throat, severe swelling of the legs, and started a "downward spiral"  . Lipitor [Atorvastatin] Other (See Comments)    Muscle aches  . Xarelto [Rivaroxaban] Rash    Antimicrobials this admission: Vanc 9/11 >>  Cefepime 9/11 >>   Thank you for allowing pharmacy to be a part of this patient's care.  Alanda Slim, PharmD, [facility] Clinical Pharmacist Please see AMION for all Pharmacists' Contact Phone Numbers 08/24/2019, 3:44 PM

## 2019-08-24 NOTE — Progress Notes (Addendum)
[facility]    NEPHROLOGY PROGRESS NOTE  SUBJECTIVE: Lethargic, unable to provide ROS.  Seen in the ED earlier this afternoon.   OBJECTIVE:  Vitals:   08/24/19 1224 08/24/19 1230  BP: 101/84 126/67  Pulse: 67 (!) 122  Resp: 20 (!) 23  Temp:    SpO2: 100% 94%    Intake/Output Summary (Last 24 hours) at 08/24/2019 1624 Last data filed at 08/24/2019 0859 Gross per 24 hour  Intake 100 ml  Output 148 ml  Net -48 ml      General:  Lethargic, NAD HEENT: MMM [place] AT anicteric sclera Neck:  positive JVD, no adenopathy CV:  Heart RRR  Lungs:  Decreased BS bilaterally Abd:  abd SNT/ND with normal BS, obese GU:  Bladder non-palpable Extremities:  (+)2 LE edema. Skin:  No skin rash  MEDICATIONS:  . ceFEPime (MAXIPIME) IV  500 mg Intravenous Q24H  . Chlorhexidine Gluconate Cloth  6 each Topical Daily  . insulin aspart  0-9 Units Subcutaneous Q4H  . mometasone-formoterol  2 puff Inhalation BID  . vancomycin variable dose per unstable renal function (pharmacist dosing)   Does not apply See admin instructions       LABS:   CBC Latest Ref Rng & Units 08/24/2019 08/23/2019 08/23/2019  WBC 4.0 - 10.5 K/uL 12.4(H) - 12.3(H)  Hemoglobin 13.0 - 17.0 g/dL 10.5(L) 10.9(L) 10.8(L)  Hematocrit 39.0 - 52.0 % 33.7(L) 32.0(L) 33.4(L)  Platelets 150 - 400 K/uL 237 - 249    CMP Latest Ref Rng & Units 08/24/2019 08/23/2019 08/23/2019  Glucose 70 - 99 mg/dL 105(H) - 127(H)  BUN 8 - 23 mg/dL 89(H) - 79(H)  Creatinine 0.61 - 1.24 mg/dL 5.05(H) - 4.58(H)  Sodium 135 - 145 mmol/L 131(L) 129(L) 132(L)  Potassium 3.5 - 5.1 mmol/L 6.8(HH) 5.4(H) 5.4(H)  Chloride 98 - 111 mmol/L 89(L) - 88(L)  CO2 22 - 32 mmol/L 26 - 28  Calcium 8.9 - 10.3 mg/dL 9.3 - 9.5  Total Protein 6.5 - 8.1 g/dL 6.3(L) - 6.4(L)  Total Bilirubin 0.3 - 1.2 mg/dL 1.2 - 1.0  Alkaline Phos 38 - 126 U/L 50 - 55  AST 15 - 41 U/L 29 - 22  ALT 0 - 44 U/L 14 - 14    Lab Results  Component Value Date   CALCIUM 9.3  08/24/2019   CAION 1.12 (L) 08/23/2019   PHOS 5.7 (H) 08/24/2019       Component Value Date/Time   COLORURINE AMBER (A) 08/23/2019 2231   APPEARANCEUR HAZY (A) 08/23/2019 2231   LABSPEC 1.017 08/23/2019 2231   PHURINE 5.0 08/23/2019 2231   GLUCOSEU NEGATIVE 08/23/2019 2231   GLUCOSEU NEGATIVE 05/25/2019 1353   HGBUR MODERATE (A) 08/23/2019 2231   BILIRUBINUR NEGATIVE 08/23/2019 2231   KETONESUR NEGATIVE 08/23/2019 2231   PROTEINUR 100 (A) 08/23/2019 2231   UROBILINOGEN 1.0 05/25/2019 1353   NITRITE NEGATIVE 08/23/2019 2231   LEUKOCYTESUR SMALL (A) 08/23/2019 2231      Component Value Date/Time   PHART 7.511 (H) 08/23/2019 2359   PCO2ART 38.7 08/23/2019 2359   PO2ART 32.0 (LL) 08/23/2019 2359   HCO3 30.9 (H) 08/23/2019 2359   TCO2 32 08/23/2019 2359   ACIDBASEDEF 2.0 12/26/2015 1327   O2SAT 67.0 08/23/2019 2359       Component Value Date/Time   IRON 47 09/12/2014 1136   IRONPCTSAT 15.1 (L) 09/12/2014 1136       ASSESSMENT/PLAN:      Acute kidney injury.  Urine appears to  have blood protein and WBCs.  Urine culture sent..  [place] without obstruction but nodular prostate noted.  Patient appears to be hypotensive with anasarca.  Continue foley.  Avoid nephrotoxins.  Avoid ACE inhibitors ARB's.  Avoid contrast.  Avoid intra-arterial procedures.  Discontinue metformin.  Probably HD today if K remains elevated.  Hypotension.  Recommend 2D echo.  Last 2D echo was consistent with diastolic dysfunction.  Would rule out pericardial effusion.  As well as heart failure with systolic dysfunction.  Recommend a.m. cortisol, blood cultures.  Urine cultures.  Will check troponins.  Will be difficult to obtain any degree of diuresis while patient remains so hypotensive.  Continue pressors prn  Respiratory failure now on BiPAP.  Possibly consistent with pulmonary edema.  Differential would include pulmonary embolus.  As well as valvular heart disease.  Pneumonia.  Recommend transfer to intensive  care unit for closer monitoring.  Atrial fibrillation continues on anticoagulation.  Anemia does not appear to be an issue at this particular time.  Continue to monitor  Diabetes mellitus per primary team  Coronary artery disease per primary team  Possible urinary tract infection being treated with Rocephin and doxycycline.  Hyperkalemia.  ?related to hemolysis.  Repeat pending.    [place], DO, [place]

## 2019-08-24 NOTE — Progress Notes (Signed)
ANTICOAGULATION CONSULT NOTE - Initial Consult  Pharmacy Consult for Heparin Indication: atrial fibrillation and LV Thrombus  Allergies  Allergen Reactions  . Tolterodine Swelling and Other (See Comments)    ([place]) Dry mouth/dry throat, severe swelling of the legs, and started a "downward spiral"  . Lipitor [Atorvastatin] Other (See Comments)    Muscle aches  . Xarelto [Rivaroxaban] Rash    Patient Measurements: Height: 6\' 4"  (193 cm) Weight: 279 lb 8.7 oz (126.8 kg) IBW/kg (Calculated) : 86.8 Heparin Dosing Weight: 115 kg  Vital Signs: Temp: 97.6 F (36.4 C) (09/11 1600) Temp Source: Oral (09/11 1600) BP: 99/68 (09/11 1704) Pulse Rate: 106 (09/11 1600)  Labs: Recent Labs    08/23/19 1655 08/23/19 1845 08/23/19 2245 08/23/19 2359 08/24/19 0329 08/24/19 1025 08/24/19 1720  HGB 10.8*  --   --  10.9*  --  10.5*  --   HCT 33.4*  --   --  32.0*  --  33.7*  --   PLT 249  --   --   --   --  237  --   APTT  --  41*  --   --   --   --  >200*  LABPROT  --  25.0*  --   --   --   --   --   INR  --  2.3*  --   --   --   --   --   HEPARINUNFRC  --   --   --   --   --   --  >2.20*  CREATININE 4.58*  --   --   --   --  5.05* 5.12*  CKTOTAL  --   --   --   --   --  61  --   TROPONINIHS  --   --  45*  --  50*  --   --     Estimated Creatinine Clearance: 17.6 mL/min (A) (by C-G formula based on SCr of 5.12 mg/dL (H)).   Medical History: Past Medical History:  Diagnosis Date  . Arthritis   . B12 deficiency   . CAD (coronary artery disease)    a. Approx. 2000 - MI. Cath: single vsl dz, PTCA dLAD/medical rx;  b. NSTEMI 11/13 => IVUS attempted for RCA but not successful; anatomy felt stable from 2000 => med Rx. c. [place] 08/2016: severe stable diffuse dLAD, severe prox RCA -> PCI of RCA unsuccessful; d. 05/2017 NSTEMI/Cath: LM nl, LAD 20ost/p, 43m, 80d, LCX 30ost, 20p, 64m, OM2 90, RCA 80p, 99d.  . Cancer ([place])    skin cancer- head   . Cataracts   . Cholelithiasis   . Chronic  diastolic CHF (congestive heart failure) ([place])    a. 05/2017 Echo: EF 60-65%, Gr2 DD, mildly dil Ao, triv MR, mildly to mod dil LA, mild PR.  . Claustrophobia   . COPD (chronic obstructive pulmonary disease) ([place])   . CVA (cerebral vascular accident) ([place]) ~ 2001   vision inparted from stroke.  Visual Memory loss  . Diabetic neuropathy ([place])   . Diverticulitis   . Dysrhythmia    if he does not take Metoprolol- Afib  . ED (erectile dysfunction)   . Essential hypertension   . History of MRSA infection    Right foot  . History of stomach ulcers   . HOH (hard of hearing)   . Hypercholesterolemia   . Legally blind   . Lower extremity edema   . Morbid obesity (  [name])   . Neuropathy   . NSTEMI (non-ST elevated myocardial infarction) ([place]) 05/2017  . OSA on CPAP   . PAF (paroxysmal atrial fibrillation) ([name])    a. confirmed by event monitor. b. 02/2014 rash on Coumadin, patient decided to discontinue Xarelto due to possible rash, cost and lawyers ads on TV, agreed to take Plavix.  . Pneumonia 2016  . Tunnel vision    Since stroke  . Type II diabetes mellitus ([place])   . Venous stasis   . Weakness 01/11/2017    Medications:  No current facility-administered medications on file prior to encounter.    Current Outpatient Medications on File Prior to Encounter  Medication Sig Dispense Refill  . acetaminophen (TYLENOL) 500 MG tablet Take 1,000 mg by mouth at bedtime.     Marland Kitchen albuterol (VENTOLIN HFA) 108 (90 Base) MCG/ACT inhaler Inhale 1-2 puffs into the lungs every 6 (six) hours as needed for wheezing or shortness of breath.    Marland Kitchen apixaban (ELIQUIS) 5 MG TABS tablet Take 1 tablet (5 mg total) by mouth 2 (two) times daily. 60 tablet 0  . aspirin EC 81 MG tablet Take 81 mg by mouth daily.    . Cholecalciferol (VITAMIN D-3) 25 MCG (1000 UT) CAPS Take 1,000 Units by mouth daily.    . Fluticasone-Salmeterol (ADVAIR DISKUS) 100-50 MCG/DOSE AEPB Inhale 1 puff into the lungs 2 (two) times daily. 3 each  3  . gabapentin (NEURONTIN) 300 MG capsule TAKE 1 CAPSULE THREE TIMES DAILY (Patient taking differently: Take 300 mg by mouth 2 (two) times daily. ) 270 capsule 3  . insulin aspart (NOVOLOG) 100 UNIT/ML FlexPen Inject 20 Units into the skin 4 (four) times daily - after meals and at bedtime. Follow SS (Patient taking differently: Inject 1-5 Units into the skin See admin instructions. Inject 1-5 units into the skin three to four times a day, per sliding scale) 15 pen 3  . isosorbide mononitrate (IMDUR) 60 MG 24 hr tablet TAKE 1 TABLET EVERY DAY (Patient taking differently: Take 60 mg by mouth daily. ) 90 tablet 3  . magnesium oxide (MAG-OX) 400 (241.3 Mg) MG tablet Take 1 tablet (400 mg total) by mouth 2 (two) times daily. 60 tablet 0  . metFORMIN (GLUCOPHAGE) 500 MG tablet TAKE 1 TABLET TWICE DAILY WITH A MEAL (Patient taking differently: Take 500 mg by mouth 2 (two) times daily with a meal. ) 180 tablet 3  . metoprolol tartrate (LOPRESSOR) 25 MG tablet Take 1 tablet (25 mg total) by mouth 2 (two) times daily. 180 tablet 3  . mupirocin ointment (BACTROBAN) 2 % Apply 1 application topically See admin instructions. Apply to affected areas of bilateral feet, ankles, and legs once daily     . nitroGLYCERIN (NITROSTAT) 0.4 MG SL tablet DISSOLVE 1 TABLET (0.4 MG TOTAL) UNDER THE TONGUE EVERY 5 (FIVE) MINUTES AS NEEDED (UP TO 3 DOSES) FOR CHEST PAIN (Patient taking differently: Place 0.4 mg under the tongue every 5 (five) minutes as needed for chest pain. ) 50 tablet 1  . pravastatin (PRAVACHOL) 40 MG tablet TAKE 1 TABLET EVERY DAY (Patient taking differently: Take 40 mg by mouth at bedtime. ) 90 tablet 3  . PRESCRIPTION MEDICATION See admin instructions. CPAP- At bedtime    . ranolazine (RANEXA) 500 MG 12 hr tablet TAKE 1 TABLET TWICE DAILY (Patient taking differently: Take 500 mg by mouth 2 (two) times daily. ) 180 tablet 3  . torsemide (DEMADEX) 20 MG tablet Take 40 mg by  mouth See admin instructions. Take 40  mg by mouth two times a day and additional 20 mg once daily until desired amount of output of urine is achieved    . triamcinolone ointment (KENALOG) 0.5 % Apply 1 application topically 2 (two) times daily. (Patient taking differently: Apply 1 application topically 2 (two) times daily as needed (for itching). ) 60 g 0  . vitamin B-12 (CYANOCOBALAMIN) 1000 MCG tablet Take 1,000 mcg by mouth daily.     . collagenase (SANTYL) ointment Apply 1 application topically daily. 15 g 3  . doxycycline (VIBRA-TABS) 100 MG tablet Take 1 tablet (100 mg total) by mouth every 12 (twelve) hours. (Patient not taking: Reported on 08/23/2019) 20 tablet 0  . torsemide (DEMADEX) 100 MG tablet Take 1 tablet (100 mg total) by mouth daily. 90 tablet 3     Assessment: 78 y.o. male with h/o Afib, Eliquis on hold, for heparin.  ECHO today revealed an LV thrombus.  Pharmacy consulted for Heparin dosing.  Heparin level came back 2.2 and associated aPTT > 200. No obvious signs of bleeding.   Goal of Therapy:  APTT 66-102 Heparin level 0.3-0.7 units/ml Monitor platelets by anticoagulation protocol: Yes   Plan:  Hold Heparin drip for 1 hour Decrease heparin 1450 units/hr APTT and heparin level in 8 hours  Alanda Slim, PharmD, [facility] Clinical Pharmacist Please see AMION for all Pharmacists' Contact Phone Numbers 08/24/2019, 7:06 PM

## 2019-08-24 NOTE — ED Notes (Signed)
Attempted to obtain second IV, unsuccessful.

## 2019-08-24 NOTE — Progress Notes (Signed)
Spoke with Claiborne Billings RN charge nurse concerning the need for a third PIV for this patient. At this time the patient has 2 working PIV's and there is an order for a third IV for a levo drip and heparin drip. At this time the patient does not have an order for the levo drip and if the patient needs that medication a central line or PICC line would be more appropriate then 3 PIV's that is best practice.

## 2019-08-24 NOTE — Progress Notes (Signed)
PROGRESS NOTE    Charles [name]  [id number] DOB: 17-Jul-1941 DOA: 08/23/2019 PCP: Cassandria Anger, MD   Brief Narrative:  Patient is a 78 year old male with history of coronary artery disease, chronic diastolic CHF, diabetes mellitus, sleep apnea, COPD, history of CVA, atrial fibrillation was recently admitted and discharged after he accidentally overdosed himself with Lasix pills .  As per the family, patient was doing poorly since discharge from the hospital and gradually developed shortness of breath with increasing lower extremity edema.  Over the last 2 days she has become very short of breath with minimal exertion.  On presentation patient was hypotensive.  Chest x-ray showed congestion with possible infiltrates.  COVID-19 test negative.  UA was concerning for UTI.  He was found to have severe acute kidney injury.  Patient remained hypotensive in the emergency department so he has been  transferred to ICU.   Assessment & Plan:   Principal Problem:   ARF (acute renal failure) ([name]) Active Problems:   CAD (coronary artery disease)   Type 2 diabetes, uncontrolled, with retinopathy ([name])   PAF (paroxysmal atrial fibrillation) ([name])   Hypotension   Acute hypoxic respiratory failure: Most likely secondary to congestive heart failure,sepsis,pneumonia.  Echocardiogram has been ordered.  Put on BiPAP on presentation.  Currently he is on oxygen via  nasal cannula.  Elevated BNP.  Chest x-ray showed congestion, possible right lower lobe infiltrate.  Sepsis/hypotension: Possible  septic shock.  UA impressive for urinary tract infection.  Chest x-ray showed possible right lower lobe pneumonia cultures have been sent.  Started on pressors by PCCM.  Started on pressors.  Lactate initiated of 3.1.  Has leukocytosis.  Acute kidney injury: Baseline creatinine normal.  Creatinine in the range of 5 today.  Urinalysis shows low sodium.  He looked intravascularly volume depleted.  He was given a  bolus of 500 mL of normal saline and was started on IV fluids which has later been discontinued.  Foley will be inserted.Monitor I/O.  A. fib with RVR: Heart rate in the range of 110s.  Was on  Eliquis for anticoagulation.  Metoprolol on hold due to hypotension.  Currently on heparin drip because of acute kidney injury.  History of bilateral chronic lower extremity cellulitis: Recently treated with doxycycline.  Continue current antibiotic  History of coronary artery disease: Follow-up 2D echo.  Currently denies any chest pain  History of chronic diastolic CHF: Follow-up echocardiogram.  Has bilateral lower extremity edema and pulmonary venous congestion but looks intravascularly depleted.  COPD: On CPAP  Hyperlipidemia:On  statin at home           DVT prophylaxis: Heparin Iv Code Status: Full Family Communication: None present at the bedside Disposition Plan: Patient transferred to ICU after PCCM consult  Consultants: PCCM, nephrology  Procedures: None  Antimicrobials:  Anti-infectives (From admission, onward)   Start     Dose/Rate Route Frequency Ordered Stop   08/23/19 1730  cefTRIAXone (ROCEPHIN) 2 g in sodium chloride 0.9 % 100 mL IVPB     2 g 200 mL/hr over 30 Minutes Intravenous Every 24 hours 08/23/19 1721     08/23/19 1730  doxycycline (VIBRAMYCIN) 100 mg in sodium chloride 0.9 % 250 mL IVPB     100 mg 125 mL/hr over 120 Minutes Intravenous  Once 08/23/19 1721 08/23/19 1956      Subjective:  Patient seen and examined the bedside this morning the emergency department.  Hypotensive, tachycardic.  Alert, awake and oriented to place  and person.  Denies any specific complaints.  Looks deconditioned.  Objective: Vitals:   08/24/19 1216 08/24/19 1218 08/24/19 1224 08/24/19 1230  BP: (!) 88/56 (!) 87/63 101/84 126/67  Pulse:   67 (!) 122  Resp:  15 20 (!) 23  Temp:      TempSrc:      SpO2:  92% 100% 94%  Weight:      Height:        Intake/Output Summary  (Last 24 hours) at 08/24/2019 1440 Last data filed at 08/24/2019 0859 Gross per 24 hour  Intake 100 ml  Output 148 ml  Net -48 ml   Filed Weights   08/23/19 1716 08/23/19 1920  Weight: 132.9 kg 132.9 kg    Examination:  General exam: chronically ill looking,weak and debilitated HEENT:PERRL,Oral mucosa dry, Ear/Nose normal on gross exam Respiratory system: Bilateral equal air entry, normal vesicular breath sounds, no wheezes or crackles  Cardiovascular system: Afib with RVR, No JVD, murmurs, rubs, gallops or clicks. No pedal edema. Gastrointestinal system: Abdomen is ndistended, soft and nontender. No organomegaly or masses felt. Normal bowel sounds heard.  Ecchymoses on abdominal wall. Central nervous system: Alert and awake.  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 Skin: scattered echymosis   Data Reviewed: I have personally reviewed following labs and imaging studies  CBC: Recent Labs  Lab 08/23/19 1655 08/23/19 2359 08/24/19 1025  WBC 12.3*  --  12.4*  NEUTROABS 11.0*  --  11.0*  HGB 10.8* 10.9* 10.5*  HCT 33.4* 32.0* 33.7*  MCV 93.0  --  95.7  PLT 249  --  [id number]   Basic Metabolic Panel: Recent Labs  Lab 08/23/19 1655 08/23/19 2359 08/24/19 1025 08/24/19 1237  NA 132* 129* 131*  --   K 5.4* 5.4* 6.8*  --   CL 88*  --  89*  --   CO2 28  --  26  --   GLUCOSE 127*  --  105*  --   BUN 79*  --  89*  --   CREATININE 4.58*  --  5.05*  --   CALCIUM 9.5  --  9.3  --   MG  --   --   --  2.0  PHOS  --   --   --  5.7*   GFR: Estimated Creatinine Clearance: 18.2 mL/min (A) (by C-G formula based on SCr of 5.05 mg/dL (H)). Liver Function Tests: Recent Labs  Lab 08/23/19 1655 08/24/19 1025  AST 22 29  ALT 14 14  ALKPHOS 55 50  BILITOT 1.0 1.2  PROT 6.4* 6.3*  ALBUMIN 3.0* 3.1*   No results  for input(s): LIPASE, AMYLASE in the last 168 hours. No results for input(s): AMMONIA in the last 168 hours. Coagulation Profile: Recent Labs  Lab 08/23/19 1845  INR 2.3*   Cardiac Enzymes: Recent Labs  Lab 08/24/19 1025  CKTOTAL 61   BNP (last 3 results) No results for input(s): PROBNP in the last 8760 hours. HbA1C: No results for input(s): HGBA1C in the last 72 hours. CBG: Recent Labs  Lab 08/23/19 2341 08/24/19 0328 08/24/19 0831 08/24/19 1238  GLUCAP 107* 95 98 98   Lipid Profile: No results for input(s): CHOL, HDL, LDLCALC, TRIG, CHOLHDL, LDLDIRECT in the last 72 hours. Thyroid Function Tests: No results for input(s): TSH, T4TOTAL, FREET4, T3FREE, THYROIDAB in  the last 72 hours. Anemia Panel: No results for input(s): VITAMINB12, FOLATE, FERRITIN, TIBC, IRON, RETICCTPCT in the last 72 hours. Sepsis Labs: Recent Labs  Lab 08/23/19 1721 08/23/19 2005 08/24/19 1025 08/24/19 1041  PROCALCITON  --   --  <0.10  --   LATICACIDVEN 3.0* 3.1*  --  2.4*    Recent Results (from the past 240 hour(s))  SARS Coronavirus 2 [facility] order, Performed in [facility] hospital lab) Nasopharyngeal Nasopharyngeal Swab     Status: None   Collection Time: 08/23/19  5:16 PM   Specimen: Nasopharyngeal Swab  Result Value Ref Range Status   SARS Coronavirus 2 NEGATIVE NEGATIVE Final    Comment: (NOTE) If result is NEGATIVE SARS-CoV-2 target nucleic acids are NOT DETECTED. The SARS-CoV-2 RNA is generally detectable in upper and lower  respiratory specimens during the acute phase of infection. The lowest  concentration of SARS-CoV-2 viral copies this assay can detect is 250  copies / mL. A negative result does not preclude SARS-CoV-2 infection  and should not be used as the sole basis for treatment or other  patient management decisions.  A negative result may occur with  improper specimen collection / handling, submission of specimen other  than nasopharyngeal swab, presence of  viral mutation(s) within the  areas targeted by this assay, and inadequate number of viral copies  (<250 copies / mL). A negative result must be combined with clinical  observations, patient history, and epidemiological information. If result is POSITIVE SARS-CoV-2 target nucleic acids are DETECTED. The SARS-CoV-2 RNA is generally detectable in upper and lower  respiratory specimens dur ing the acute phase of infection.  Positive  results are indicative of active infection with SARS-CoV-2.  Clinical  correlation with patient history and other diagnostic information is  necessary to determine patient infection status.  Positive results do  not rule out bacterial infection or co-infection with other viruses. If result is PRESUMPTIVE POSTIVE SARS-CoV-2 nucleic acids MAY BE PRESENT.   A presumptive positive result was obtained on the submitted specimen  and confirmed on repeat testing.  While 2019 novel coronavirus  (SARS-CoV-2) nucleic acids may be present in the submitted sample  additional confirmatory testing may be necessary for epidemiological  and / or clinical management purposes  to differentiate between  SARS-CoV-2 and other Sarbecovirus currently known to infect humans.  If clinically indicated additional testing with an alternate test  methodology [phone]) is advised. The SARS-CoV-2 RNA is generally  detectable in upper and lower respiratory sp ecimens during the acute  phase of infection. The expected result is Negative. Fact Sheet for Patients:  StrictlyIdeas.no Fact Sheet for Healthcare Providers: BankingDealers.co.za This test is not yet approved or cleared by the [place] FDA and has been authorized for detection and/or diagnosis of SARS-CoV-2 by FDA under an Emergency Use Authorization (EUA).  This EUA will remain in effect (meaning this test can be used) for the duration of the COVID-19 declaration under Section  564(b)(1) of the Act, 21 U.S.C. section 360bbb-3(b)(1), unless the authorization is terminated or revoked sooner. Performed at [facility], [place] [address], [place], [place] [address]   Blood Culture (routine x 2)     Status: None (Preliminary result)   Collection Time: 08/23/19  5:26 PM   Specimen: BLOOD  Result Value Ref Range Status   Specimen Description BLOOD BLOOD LEFT FOREARM  Final   Special Requests   Final    BOTTLES DRAWN AEROBIC AND ANAEROBIC Blood Culture adequate volume  Culture   Final    NO GROWTH < 24 HOURS Performed at [facility], [place] [address] [place], [place] [address]    Report Status PENDING  Incomplete  Blood Culture (routine x 2)     Status: None (Preliminary result)   Collection Time: 08/23/19  6:02 PM   Specimen: BLOOD  Result Value Ref Range Status   Specimen Description BLOOD RIGHT ANTECUBITAL  Final   Special Requests   Final    BOTTLES DRAWN AEROBIC ONLY Blood Culture results may not be optimal due to an inadequate volume of blood received in culture bottles   Culture   Final    NO GROWTH < 24 HOURS Performed at [facility], [place] [address], [place], [place] [address]    Report Status PENDING  Incomplete         Radiology Studies: US Renal  Result Date: 08/24/2019 CLINICAL DATA:  Acute renal failure EXAM: RENAL / URINARY TRACT ULTRASOUND COMPLETE COMPARISON:  None. FINDINGS: Somewhat limited exam due to overlying bowel gas. Right Kidney: Renal measurements: 13.3 x 6.6 x 6.7 = volume: 316 mL . Echogenicity within normal limits. No mass or hydronephrosis visualized. Left Kidney: Renal measurements: 11.8 x 6.2 x 7.0 = volume: 266 mL. Echogenicity within normal limits. No mass or hydronephrosis visualized. Bladder: The bladder is partially distended there is a heterogeneous nodular appearance to the prostate which protrudes into the posterior bladder. IMPRESSION: Somewhat limited exam due to overlying bowel gas, however normal  appearing kidneys. Enlarged nodular prostate protruding into the posterior bladder. Electronically Signed   By: Prudencio Pair M.D.   On: 08/24/2019 02:18   Dg Chest Port 1 View  Result Date: 08/23/2019 CLINICAL DATA:  Dyspnea EXAM: PORTABLE CHEST 1 VIEW COMPARISON:  06/01/2017 FINDINGS: Cardiomegaly. Mild pulmonary vascular congestion. Calcific aortic knob. Right basilar opacity. No pneumothorax. Advanced degenerative changes of the shoulders. IMPRESSION: 1. Right basilar opacity which may reflect pleural effusion with atelectasis and/or pneumonia. 2. Cardiomegaly and pulmonary vascular congestion suggesting CHF. Electronically Signed   By: Davina Poke M.D.   On: 08/23/2019 17:12        Scheduled Meds:  Chlorhexidine Gluconate Cloth  6 each Topical Daily   insulin aspart  0-9 Units Subcutaneous Q4H   mometasone-formoterol  2 puff Inhalation BID   Continuous Infusions:  sodium chloride 10 mL/hr at 08/24/19 1426   sodium chloride     cefTRIAXone (ROCEPHIN)  IV Stopped (08/23/19 1906)   heparin 1,700 Units/hr (08/24/19 0451)   norepinephrine (LEVOPHED) Adult infusion 3 mcg/min (08/24/19 1219)     LOS: 1 day    Time spent: 35 mins.More than 50% of that time was spent in counseling and/or coordination of care.      Shelly Coss, MD [facility] Pager [phone]  If 7PM-7AM, please contact night-coverage [email or web address] Password [email or web address] 08/24/2019, 2:40 PM

## 2019-08-24 NOTE — Progress Notes (Signed)
PCCM Interval Note  Notified by RN that CRRT not running well, issue with catheter and access port (red) is pulling air bubbles at times on aspiration.  All ports flush well.  ROM of neck without effect and catheter rotated without improvement.   Additionally, patient having frank blood out of foley catheter.  Is on heparin gtt for afib and LV thrombus.  - CXR reviewed from trialysis 15 cm catheter placement earlier today- with tip noted to be at cavoatrial junction.    P:  R IJ trialysis catheter repositioned / withdrawn by 1 cm to 14 with improvement in aspiration, no air, and all ports flushing well.  Line resutured, biopatch and sterile dressing applied.    Heparin held.  Pharmacy to be notified and adjust.   Monitor UOP Trend CBC  I spent 20 minutes in direct patient care including reviewing data,  discussing with other providers, assessment, planning and stabilization and documentation. Time is exclusive to this patient and does not include procedures.    Kennieth Rad, MSN, AGACNP-BC [facility] Pgr: [phone] or if no answer [phone] 08/24/2019, 11:34 PM

## 2019-08-24 NOTE — Procedures (Signed)
Hemodialysis Catheter Insertion Procedure Note Charles [name] [id number] [birth year]  Procedure: Insertion of Hemodialysis Catheter Indications: Dialysis Access   Procedure Details Consent: Risks of procedure as well as the alternatives and risks of each were explained to the (patient/caregiver).  Consent for procedure obtained. Time Out: Verified patient identification, verified procedure, site/side was marked, verified correct patient position, special equipment/implants available, medications/allergies/relevent history reviewed, required imaging and test results available.  Performed  Maximum sterile technique was used including antiseptics, cap, gloves, gown, hand hygiene, mask and sheet. Skin prep: Chlorhexidine; local anesthetic administered Triple lumen hemodialysis catheter was inserted into right internal jugular vein using the Seldinger technique.  Evaluation Blood flow good Complications: No apparent complications Patient did tolerate procedure well. Chest X-ray ordered to verify placement.  CXR: pending.   Candee Furbish 08/24/2019

## 2019-08-24 NOTE — Progress Notes (Signed)
ANTICOAGULATION CONSULT NOTE - Initial Consult  Pharmacy Consult for Heparin Indication: atrial fibrillation  Allergies  Allergen Reactions  . Tolterodine Swelling and Other (See Comments)    (Detrol) Dry mouth/dry throat, severe swelling of the legs, and started a "downward spiral"  . Lipitor [Atorvastatin] Other (See Comments)    Muscle aches  . Xarelto [Rivaroxaban] Rash    Patient Measurements: Height: 6\' 4"  (193 cm) Weight: 293 lb (132.9 kg) IBW/kg (Calculated) : 86.8 Heparin Dosing Weight: 115 kg  Vital Signs: Temp: 97.4 F (36.3 C) (09/10 1528) Temp Source: Oral (09/10 1528) BP: 123/97 (09/10 2215) Pulse Rate: 73 (09/10 2215)  Labs: Recent Labs    08/23/19 1655 08/23/19 1845 08/23/19 2245 08/23/19 2359  HGB 10.8*  --   --  10.9*  HCT 33.4*  --   --  32.0*  PLT 249  --   --   --   APTT  --  41*  --   --   LABPROT  --  25.0*  --   --   INR  --  2.3*  --   --   CREATININE 4.58*  --   --   --   TROPONINIHS  --   --  45*  --     Estimated Creatinine Clearance: 20.1 mL/min (A) (by C-G formula based on SCr of 4.58 mg/dL (H)).   Medical History: Past Medical History:  Diagnosis Date  . Arthritis   . B12 deficiency   . CAD (coronary artery disease)    a. Approx. 2000 - MI. Cath: single vsl dz, PTCA dLAD/medical rx;  b. NSTEMI 11/13 => IVUS attempted for RCA but not successful; anatomy felt stable from 2000 => med Rx. c. [place] 08/2016: severe stable diffuse dLAD, severe prox RCA -> PCI of RCA unsuccessful; d. 05/2017 NSTEMI/Cath: LM nl, LAD 20ost/p, 65m, 80d, LCX 30ost, 20p, 49m, OM2 90, RCA 80p, 99d.  . Cancer ([place])    skin cancer- head   . Cataracts   . Cholelithiasis   . Chronic diastolic CHF (congestive heart failure) ([place])    a. 05/2017 Echo: EF 60-65%, Gr2 DD, mildly dil Ao, triv MR, mildly to mod dil LA, mild PR.  . Claustrophobia   . COPD (chronic obstructive pulmonary disease) ([place])   . CVA (cerebral vascular accident) ([place]) ~ 2001   vision inparted  from stroke.  Visual Memory loss  . Diabetic neuropathy ([place])   . Diverticulitis   . Dysrhythmia    if he does not take Metoprolol- Afib  . ED (erectile dysfunction)   . Essential hypertension   . History of MRSA infection    Right foot  . History of stomach ulcers   . HOH (hard of hearing)   . Hypercholesterolemia   . Legally blind   . Lower extremity edema   . Morbid obesity ([place])   . Neuropathy   . NSTEMI (non-ST elevated myocardial infarction) ([place]) 05/2017  . OSA on CPAP   . PAF (paroxysmal atrial fibrillation) ([name])    a. confirmed by event monitor. b. 02/2014 rash on Coumadin, patient decided to discontinue Xarelto due to possible rash, cost and lawyers ads on TV, agreed to take Plavix.  . Pneumonia 2016  . Tunnel vision    Since stroke  . Type II diabetes mellitus ([place])   . Venous stasis   . Weakness 01/11/2017    Medications:  No current facility-administered medications on file prior to encounter.    Current Outpatient Medications on  File Prior to Encounter  Medication Sig Dispense Refill  . acetaminophen (TYLENOL) 500 MG tablet Take 1,000 mg by mouth at bedtime.     Marland Kitchen albuterol (VENTOLIN HFA) 108 (90 Base) MCG/ACT inhaler Inhale 1-2 puffs into the lungs every 6 (six) hours as needed for wheezing or shortness of breath.    Marland Kitchen apixaban (ELIQUIS) 5 MG TABS tablet Take 1 tablet (5 mg total) by mouth 2 (two) times daily. 60 tablet 0  . aspirin EC 81 MG tablet Take 81 mg by mouth daily.    . Cholecalciferol (VITAMIN D-3) 25 MCG (1000 UT) CAPS Take 1,000 Units by mouth daily.    . Fluticasone-Salmeterol (ADVAIR DISKUS) 100-50 MCG/DOSE AEPB Inhale 1 puff into the lungs 2 (two) times daily. 3 each 3  . gabapentin (NEURONTIN) 300 MG capsule TAKE 1 CAPSULE THREE TIMES DAILY (Patient taking differently: Take 300 mg by mouth 2 (two) times daily. ) 270 capsule 3  . insulin aspart (NOVOLOG) 100 UNIT/ML FlexPen Inject 20 Units into the skin 4 (four) times daily - after meals and at  bedtime. Follow SS (Patient taking differently: Inject 1-5 Units into the skin See admin instructions. Inject 1-5 units into the skin three to four times a day, per sliding scale) 15 pen 3  . isosorbide mononitrate (IMDUR) 60 MG 24 hr tablet TAKE 1 TABLET EVERY DAY (Patient taking differently: Take 60 mg by mouth daily. ) 90 tablet 3  . magnesium oxide (MAG-OX) 400 (241.3 Mg) MG tablet Take 1 tablet (400 mg total) by mouth 2 (two) times daily. 60 tablet 0  . metFORMIN (GLUCOPHAGE) 500 MG tablet TAKE 1 TABLET TWICE DAILY WITH A MEAL (Patient taking differently: Take 500 mg by mouth 2 (two) times daily with a meal. ) 180 tablet 3  . metoprolol tartrate (LOPRESSOR) 25 MG tablet Take 1 tablet (25 mg total) by mouth 2 (two) times daily. 180 tablet 3  . mupirocin ointment (BACTROBAN) 2 % Apply 1 application topically See admin instructions. Apply to affected areas of bilateral feet, ankles, and legs once daily     . nitroGLYCERIN (NITROSTAT) 0.4 MG SL tablet DISSOLVE 1 TABLET (0.4 MG TOTAL) UNDER THE TONGUE EVERY 5 (FIVE) MINUTES AS NEEDED (UP TO 3 DOSES) FOR CHEST PAIN (Patient taking differently: Place 0.4 mg under the tongue every 5 (five) minutes as needed for chest pain. ) 50 tablet 1  . pravastatin (PRAVACHOL) 40 MG tablet TAKE 1 TABLET EVERY DAY (Patient taking differently: Take 40 mg by mouth at bedtime. ) 90 tablet 3  . PRESCRIPTION MEDICATION See admin instructions. CPAP- At bedtime    . ranolazine (RANEXA) 500 MG 12 hr tablet TAKE 1 TABLET TWICE DAILY (Patient taking differently: Take 500 mg by mouth 2 (two) times daily. ) 180 tablet 3  . torsemide (DEMADEX) 20 MG tablet Take 40 mg by mouth See admin instructions. Take 40 mg by mouth two times a day and additional 20 mg once daily until desired amount of output of urine is achieved    . triamcinolone ointment (KENALOG) 0.5 % Apply 1 application topically 2 (two) times daily. (Patient taking differently: Apply 1 application topically 2 (two) times  daily as needed (for itching). ) 60 g 0  . vitamin B-12 (CYANOCOBALAMIN) 1000 MCG tablet Take 1,000 mcg by mouth daily.     . collagenase (SANTYL) ointment Apply 1 application topically daily. 15 g 3  . doxycycline (VIBRA-TABS) 100 MG tablet Take 1 tablet (100 mg total) by mouth every  12 (twelve) hours. (Patient not taking: Reported on 08/23/2019) 20 tablet 0  . torsemide (DEMADEX) 100 MG tablet Take 1 tablet (100 mg total) by mouth daily. 90 tablet 3     Assessment: 78 y.o. male with h/o Afib, Eliquis on hold, for heparin.   Goal of Therapy:  APTT 66-102 Heparin level 0.3-0.7 units/ml Monitor platelets by anticoagulation protocol: Yes   Plan:  Start heparin 1700 units/hr APTT and heparin level in 8 hours  Caryl Pina 08/24/2019,12:05 AM

## 2019-08-24 NOTE — ED Notes (Signed)
ED TO INPATIENT HANDOFF REPORT  ED Nurse Name and Phone #: Davene Costain [phone]  S Name/Age/Gender Charles [name] 78 y.o. male Room/Bed: 013C/013C  Code Status   Code Status: Full Code  Home/SNF/Other Home Patient oriented to: self, place, time and situation Is this baseline? Yes   Triage Complete: Triage complete  Chief Complaint Weakness  Triage Note No notes on file   Allergies Allergies  Allergen Reactions  . Tolterodine Swelling and Other (See Comments)    (Detrol) Dry mouth/dry throat, severe swelling of the legs, and started a "downward spiral"  . Lipitor [Atorvastatin] Other (See Comments)    Muscle aches  . Xarelto [Rivaroxaban] Rash    Level of Care/Admitting Diagnosis ED Disposition    ED Disposition Condition [facility] Area: [place] [[id number]]  Level of Care: ICU [6]  Covid Evaluation: Confirmed COVID Negative  Diagnosis: Hypotension [id number]  Admitting Physician: Collene Gobble [[id number]]  Attending Physician: Collene Gobble [[id number]]  Estimated length of stay: 5 - 7 days  Certification:: I certify this patient will need inpatient services for at least 2 midnights  PT Class (Do Not Modify): Inpatient [101]  PT Acc Code (Do Not Modify): Private [1]       B Medical/Surgery History Past Medical History:  Diagnosis Date  . Arthritis   . B12 deficiency   . CAD (coronary artery disease)    a. Approx. 2000 - MI. Cath: single vsl dz, PTCA dLAD/medical rx;  b. NSTEMI 11/13 => IVUS attempted for RCA but not successful; anatomy felt stable from 2000 => med Rx. c. [place] 08/2016: severe stable diffuse dLAD, severe prox RCA -> PCI of RCA unsuccessful; d. 05/2017 NSTEMI/Cath: LM nl, LAD 20ost/p, 16m, 80d, LCX 30ost, 20p, 2m, OM2 90, RCA 80p, 99d.  . Cancer ([place])    skin cancer- head   . Cataracts   . Cholelithiasis   . Chronic diastolic CHF (congestive heart failure) ([place])    a. 05/2017 Echo: EF 60-65%, Gr2 DD, mildly dil Ao, triv  MR, mildly to mod dil LA, mild PR.  . Claustrophobia   . COPD (chronic obstructive pulmonary disease) ([place])   . CVA (cerebral vascular accident) ([place]) ~ 2001   vision inparted from stroke.  Visual Memory loss  . Diabetic neuropathy ([place])   . Diverticulitis   . Dysrhythmia    if he does not take Metoprolol- Afib  . ED (erectile dysfunction)   . Essential hypertension   . History of MRSA infection    Right foot  . History of stomach ulcers   . HOH (hard of hearing)   . Hypercholesterolemia   . Legally blind   . Lower extremity edema   . Morbid obesity ([place])   . Neuropathy   . NSTEMI (non-ST elevated myocardial infarction) ([place]) 05/2017  . OSA on CPAP   . PAF (paroxysmal atrial fibrillation) ([name])    a. confirmed by event monitor. b. 02/2014 rash on Coumadin, patient decided to discontinue Xarelto due to possible rash, cost and lawyers ads on TV, agreed to take Plavix.  . Pneumonia 2016  . Tunnel vision    Since stroke  . Type II diabetes mellitus ([place])   . Venous stasis   . Weakness 01/11/2017   Past Surgical History:  Procedure Laterality Date  . CARDIAC CATHETERIZATION  1990's  . CARDIAC CATHETERIZATION N/A 08/17/2016   Procedure: Left Heart Cath and Coronary Angiography;  Surgeon: Peter M Martinique, MD;  Location:  [facility] INVASIVE CV LAB;  Service: Cardiovascular;  Laterality: N/A;  . CARDIAC CATHETERIZATION N/A 08/19/2016   Procedure: Coronary Balloon Angioplasty;  Surgeon: Peter M Martinique, MD;  Location: [facility] CV LAB;  Service: Cardiovascular;  Laterality: N/A;  . CATARACT EXTRACTION  10/2016  . CEREBRAL ANGIOGRAM  ~ 2000  . COLONOSCOPY    . LEFT HEART CATH AND CORONARY ANGIOGRAPHY N/A 06/01/2017   Procedure: Left Heart Cath and Coronary Angiography;  Surgeon: Burnell Blanks, MD;  Location: [facility] CV LAB;  Service: Cardiovascular;  Laterality: N/A;  . LEFT HEART CATHETERIZATION WITH CORONARY ANGIOGRAM N/A 10/27/2012   Procedure: LEFT HEART CATHETERIZATION WITH  CORONARY ANGIOGRAM;  Surgeon: Burnell Blanks, MD;  Location: [facility] CATH LAB;  Service: Cardiovascular;  Laterality: N/A;  . TOE AMPUTATION  2006; 2009   "Dr. Blenda Mounts; big toe left foot; little toe on right foot" (10/26/2012)  . TOE AMPUTATION Right 12/24/2015   2nd & 3 toes/notes 1/13//2017  . TOE AMPUTATION Right    5TH TOE  . 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   08/24/19    0858    -   less than 1   Wound / Incision (Open or Dehisced) 08/04/19 Non-pressure wound Foot Left;Anterior   08/04/19    0243    Foot   20   Wound / Incision (Open or Dehisced) 08/06/19 Non-pressure wound Foot Left dark area; calleoused, chronic in nature   08/06/19    0931    Foot   18   Wound / Incision (Open or Dehisced) 08/06/19 Non-pressure wound Pretibial Distal;Right intact serous filled bulla   08/06/19    0931    Pretibial   18          Intake/Output Last 24 hours  Intake/Output Summary (Last 24 hours) at 08/24/2019 1257 Last data filed at 08/24/2019 0859 Gross per 24 hour  Intake 100 ml  Output 148 ml  Net -48 ml    Labs/Imaging Results for orders placed or performed during the hospital encounter of 08/23/19 (from the past 48 hour(s))  Brain natriuretic peptide     Status: Abnormal   Collection Time: 08/23/19  4:50 PM  Result Value Ref Range   B Natriuretic Peptide 1,871.6 (H) 0.0 - 100.0 pg/mL    Comment: Performed at [facility], [place] [address], [place], [place] [address]  Comprehensive  metabolic panel     Status: Abnormal   Collection Time: 08/23/19  4:55 PM  Result Value Ref Range   Sodium 132 (L) 135 - 145 mmol/L   Potassium 5.4 (H) 3.5 - 5.1 mmol/L   Chloride 88 (L) 98 - 111 mmol/L   CO2 28 22 - 32 mmol/L   Glucose, Bld 127 (H) 70 - 99 mg/dL   BUN 79 (H) 8 - 23 mg/dL   Creatinine, Ser 4.58 (H) 0.61 - 1.24 mg/dL  Calcium 9.5 8.9 - 10.3 mg/dL   Total Protein 6.4 (L) 6.5 - 8.1 g/dL   Albumin 3.0 (L) 3.5 - 5.0 g/dL   AST 22 15 - 41 U/L   ALT 14 0 - 44 U/L   Alkaline Phosphatase 55 38 - 126 U/L   Total Bilirubin 1.0 0.3 - 1.2 mg/dL   GFR calc non Af Amer 11 (L) >60 mL/min   GFR calc Af Amer 13 (L) >60 mL/min   Anion gap 16 (H) 5 - 15    Comment: Performed at [facility] [address], [place], [place] [address]  CBC with Differential     Status: Abnormal   Collection Time: 08/23/19  4:55 PM  Result Value Ref Range   WBC 12.3 (H) 4.0 - 10.5 K/uL   RBC 3.59 (L) 4.22 - 5.81 MIL/uL   Hemoglobin 10.8 (L) 13.0 - 17.0 g/dL   HCT 33.4 (L) 39.0 - 52.0 %   MCV 93.0 80.0 - 100.0 fL   MCH 30.1 26.0 - 34.0 pg   MCHC 32.3 30.0 - 36.0 g/dL   RDW 15.9 (H) 11.5 - 15.5 %   Platelets 249 150 - 400 K/uL   nRBC 0.0 0.0 - 0.2 %   Neutrophils Relative % 90 %   Neutro Abs 11.0 (H) 1.7 - 7.7 K/uL   Lymphocytes Relative 3 %   Lymphs Abs 0.4 (L) 0.7 - 4.0 K/uL   Monocytes Relative 6 %   Monocytes Absolute 0.7 0.1 - 1.0 K/uL   Eosinophils Relative 0 %   Eosinophils Absolute 0.0 0.0 - 0.5 K/uL   Basophils Relative 0 %   Basophils Absolute 0.0 0.0 - 0.1 K/uL   Immature Granulocytes 1 %   Abs Immature Granulocytes 0.14 (H) 0.00 - 0.07 K/uL    Comment: Performed at [facility] [address], [place], [place] [address]  SARS Coronavirus 2 [facility] order, Performed in [facility] hospital lab) Nasopharyngeal Nasopharyngeal Swab     Status: None   Collection Time: 08/23/19  5:16 PM   Specimen: Nasopharyngeal Swab  Result Value Ref Range   SARS Coronavirus 2  NEGATIVE NEGATIVE    Comment: (NOTE) If result is NEGATIVE SARS-CoV-2 target nucleic acids are NOT DETECTED. The SARS-CoV-2 RNA is generally detectable in upper and lower  respiratory specimens during the acute phase of infection. The lowest  concentration of SARS-CoV-2 viral copies this assay can detect is 250  copies / mL. A negative result does not preclude SARS-CoV-2 infection  and should not be used as the sole basis for treatment or other  patient management decisions.  A negative result may occur with  improper specimen collection / handling, submission of specimen other  than nasopharyngeal swab, presence of viral mutation(s) within the  areas targeted by this assay, and inadequate number of viral copies  (<250 copies / mL). A negative result must be combined with clinical  observations, patient history, and epidemiological information. If result is POSITIVE SARS-CoV-2 target nucleic acids are DETECTED. The SARS-CoV-2 RNA is generally detectable in upper and lower  respiratory specimens dur ing the acute phase of infection.  Positive  results are indicative of active infection with SARS-CoV-2.  Clinical  correlation with patient history and other diagnostic information is  necessary to determine patient infection status.  Positive results do  not rule out bacterial infection or co-infection with other viruses. If result is PRESUMPTIVE POSTIVE SARS-CoV-2 nucleic acids MAY BE PRESENT.   A  presumptive positive result was obtained on the submitted specimen  and confirmed on repeat testing.  While 2019 novel coronavirus  (SARS-CoV-2) nucleic acids may be present in the submitted sample  additional confirmatory testing may be necessary for epidemiological  and / or clinical management purposes  to differentiate between  SARS-CoV-2 and other Sarbecovirus currently known to infect humans.  If clinically indicated additional testing with an alternate test  methodology [id number]) is  advised. The SARS-CoV-2 RNA is generally  detectable in upper and lower respiratory sp ecimens during the acute  phase of infection. The expected result is Negative. Fact Sheet for Patients:  StrictlyIdeas.no Fact Sheet for Healthcare Providers: BankingDealers.co.za This test is not yet approved or cleared by the [place] FDA and has been authorized for detection and/or diagnosis of SARS-CoV-2 by FDA under an Emergency Use Authorization (EUA).  This EUA will remain in effect (meaning this test can be used) for the duration of the COVID-19 declaration under Section 564(b)(1) of the Act, 21 U.S.C. section 360bbb-3(b)(1), unless the authorization is terminated or revoked sooner. Performed at [facility], [place] [address] [place], [place] [address]   Lactic acid, plasma     Status: Abnormal   Collection Time: 08/23/19  5:21 PM  Result Value Ref Range   Lactic Acid, Venous 3.0 (HH) 0.5 - 1.9 mmol/L    Comment: CRITICAL RESULT CALLED TO, READ BACK BY AND VERIFIED WITH: J KOPP,RN 1815 08/23/2019 WBOND Performed at [facility], [place] [address], [place], [place] [address]   Blood Culture (routine x 2)     Status: None (Preliminary result)   Collection Time: 08/23/19  5:26 PM   Specimen: BLOOD  Result Value Ref Range   Specimen Description BLOOD BLOOD LEFT FOREARM    Special Requests      BOTTLES DRAWN AEROBIC AND ANAEROBIC Blood Culture adequate volume   Culture      NO GROWTH < 24 HOURS Performed at [facility], [place] [address] [place], [place] [address]    Report Status PENDING   Blood Culture (routine x 2)     Status: None (Preliminary result)   Collection Time: 08/23/19  6:02 PM   Specimen: BLOOD  Result Value Ref Range   Specimen Description BLOOD RIGHT ANTECUBITAL    Special Requests      BOTTLES DRAWN AEROBIC ONLY Blood Culture results may not be optimal due to an inadequate volume of blood received in  culture bottles   Culture      NO GROWTH < 24 HOURS Performed at [facility] [address], [place], [place] [address]    Report Status PENDING   Urinalysis, Routine w reflex microscopic     Status: Abnormal   Collection Time: 08/23/19  6:30 PM  Result Value Ref Range   Color, Urine AMBER (A) YELLOW    Comment: BIOCHEMICALS MAY BE AFFECTED BY COLOR   APPearance HAZY (A) CLEAR   Specific Gravity, Urine 1.018 1.005 - 1.030   pH 5.0 5.0 - 8.0   Glucose, UA NEGATIVE NEGATIVE mg/dL   Hgb urine dipstick MODERATE (A) NEGATIVE   Bilirubin Urine NEGATIVE NEGATIVE   Ketones, ur NEGATIVE NEGATIVE mg/dL   Protein, ur 100 (A) NEGATIVE mg/dL   Nitrite NEGATIVE NEGATIVE   Leukocytes,Ua SMALL (A) NEGATIVE   RBC / HPF >50 (H) 0 - 5 RBC/hpf   WBC, UA >50 (H) 0 - 5 WBC/hpf   Bacteria, UA FEW (A) NONE SEEN  Squamous Epithelial / LPF 0-5 0 - 5   WBC Clumps PRESENT    Mucus PRESENT     Comment: Performed at [facility], [place] [address] [place], [place] [address]  Creatinine, urine, random     Status: None   Collection Time: 08/23/19  6:38 PM  Result Value Ref Range   Creatinine, Urine 92.81 mg/dL    Comment: Performed at [facility] [address], [place], [place] [address]  Urea nitrogen, urine     Status: None   Collection Time: 08/23/19  6:38 PM  Result Value Ref Range   Urea Nitrogen, Ur 245 Not Estab. mg/dL    Comment: (NOTE) Performed At: [facility] [place], [place] [id number] Rush Farmer MD [id number]   APTT     Status: Abnormal   Collection Time: 08/23/19  6:45 PM  Result Value Ref Range   aPTT 41 (H) 24 - 36 seconds    Comment:        IF BASELINE aPTT IS ELEVATED, SUGGEST PATIENT RISK ASSESSMENT BE USED TO DETERMINE APPROPRIATE ANTICOAGULANT THERAPY. Performed at [facility], [place] [address], [place], [place] [address]   Protime-INR     Status: Abnormal   Collection Time: 08/23/19  6:45 PM  Result Value Ref Range    Prothrombin Time 25.0 (H) 11.4 - 15.2 seconds   INR 2.3 (H) 0.8 - 1.2    Comment: (NOTE) INR goal varies based on device and disease states. Performed at [facility], [place] [address] [place], [place] [address]   Lactic acid, plasma     Status: Abnormal   Collection Time: 08/23/19  8:05 PM  Result Value Ref Range   Lactic Acid, Venous 3.1 (HH) 0.5 - 1.9 mmol/L    Comment: CRITICAL VALUE NOTED.  VALUE IS CONSISTENT WITH PREVIOUSLY REPORTED AND CALLED VALUE. Performed at [facility], [place] [address] [place], [place] [address]   Sodium, urine, random     Status: None   Collection Time: 08/23/19 10:31 PM  Result Value Ref Range   Sodium, Ur 19 mmol/L    Comment: Performed at [facility] [address] [place], [place] [address]  Urinalysis, Routine w reflex microscopic     Status: Abnormal   Collection Time: 08/23/19 10:31 PM  Result Value Ref Range   Color, Urine AMBER (A) YELLOW    Comment: BIOCHEMICALS MAY BE AFFECTED BY COLOR   APPearance HAZY (A) CLEAR   Specific Gravity, Urine 1.017 1.005 - 1.030   pH 5.0 5.0 - 8.0   Glucose, UA NEGATIVE NEGATIVE mg/dL   Hgb urine dipstick MODERATE (A) NEGATIVE   Bilirubin Urine NEGATIVE NEGATIVE   Ketones, ur NEGATIVE NEGATIVE mg/dL   Protein, ur 100 (A) NEGATIVE mg/dL   Nitrite NEGATIVE NEGATIVE   Leukocytes,Ua SMALL (A) NEGATIVE   RBC / HPF >50 (H) 0 - 5 RBC/hpf   WBC, UA 11-20 0 - 5 WBC/hpf   Bacteria, UA RARE (A) NONE SEEN   Squamous Epithelial / LPF 0-5 0 - 5   Mucus PRESENT    Hyaline Casts, UA PRESENT    Non Squamous Epithelial 0-5 (A) NONE SEEN    Comment: Performed at [facility], [place] [address], [place], [place] [address]  Troponin I (High Sensitivity)     Status: Abnormal   Collection Time: 08/23/19 10:45 PM  Result Value Ref Range   Troponin I (High Sensitivity) 45 (  H) <18 ng/L    Comment: (NOTE) Elevated high sensitivity troponin I (hsTnI) values and significant  changes across serial  measurements may suggest ACS but many other  chronic and acute conditions are known to elevate hsTnI results.  Refer to the "Links" section for chest pain algorithms and additional  guidance. Performed at [facility], [place] [address] [place], [place] [address]   CBG monitoring, ED     Status: Abnormal   Collection Time: 08/23/19 11:41 PM  Result Value Ref Range   Glucose-Capillary 107 (H) 70 - 99 mg/dL  I-STAT 7, (LYTES, BLD GAS, ICA, H+H)     Status: Abnormal   Collection Time: 08/23/19 11:59 PM  Result Value Ref Range   pH, Arterial 7.511 (H) 7.350 - 7.450   pCO2 arterial 38.7 32.0 - 48.0 mmHg   pO2, Arterial 32.0 (LL) 83.0 - 108.0 mmHg   Bicarbonate 30.9 (H) 20.0 - 28.0 mmol/L   TCO2 32 22 - 32 mmol/L   O2 Saturation 67.0 %   Acid-Base Excess 7.0 (H) 0.0 - 2.0 mmol/L   Sodium 129 (L) 135 - 145 mmol/L   Potassium 5.4 (H) 3.5 - 5.1 mmol/L   Calcium, Ion 1.12 (L) 1.15 - 1.40 mmol/L   HCT 32.0 (L) 39.0 - 52.0 %   Hemoglobin 10.9 (L) 13.0 - 17.0 g/dL   Patient temperature HIDE    Collection site RADIAL, ALLEN'S TEST ACCEPTABLE    Drawn by RT    Sample type ARTERIAL    Comment NOTIFIED PHYSICIAN   CBG monitoring, ED     Status: None   Collection Time: 08/24/19  3:28 AM  Result Value Ref Range   Glucose-Capillary 95 70 - 99 mg/dL  Troponin I (High Sensitivity)     Status: Abnormal   Collection Time: 08/24/19  3:29 AM  Result Value Ref Range   Troponin I (High Sensitivity) 50 (H) <18 ng/L    Comment: (NOTE) Elevated high sensitivity troponin I (hsTnI) values and significant  changes across serial measurements may suggest ACS but many other  chronic and acute conditions are known to elevate hsTnI results.  Refer to the "Links" section for chest pain algorithms and additional  guidance. Performed at [facility], [place] [address], [place], [place] [address]   CBG monitoring, ED     Status: None   Collection Time: 08/24/19  8:31 AM  Result Value Ref Range    Glucose-Capillary 98 70 - 99 mg/dL  Salicylate level     Status: None   Collection Time: 08/24/19 10:25 AM  Result Value Ref Range   Salicylate Lvl [id number] 2.8 - 30.0 mg/dL    Comment: Performed at [facility], [place] [address], [place] [address]  CBC with Differential/Platelet     Status: Abnormal   Collection Time: 08/24/19 10:25 AM  Result Value Ref Range   WBC 12.4 (H) 4.0 - 10.5 K/uL   RBC 3.52 (L) 4.22 - 5.81 MIL/uL   Hemoglobin 10.5 (L) 13.0 - 17.0 g/dL   HCT 33.7 (L) 39.0 - 52.0 %   MCV 95.7 80.0 - 100.0 fL   MCH 29.8 26.0 - 34.0 pg   MCHC 31.2 30.0 - 36.0 g/dL   RDW 16.4 (H) 11.5 - 15.5 %   Platelets 237 150 - 400 K/uL   nRBC 0.0 0.0 - 0.2 %   Neutrophils Relative % 88 %   Neutro Abs 11.0 (H) 1.7 - 7.7 K/uL   Lymphocytes  Relative 4 %   Lymphs Abs 0.5 (L) 0.7 - 4.0 K/uL   Monocytes Relative 7 %   Monocytes Absolute 0.8 0.1 - 1.0 K/uL   Eosinophils Relative 0 %   Eosinophils Absolute 0.0 0.0 - 0.5 K/uL   Basophils Relative 0 %   Basophils Absolute 0.0 0.0 - 0.1 K/uL   Immature Granulocytes 1 %   Abs Immature Granulocytes 0.14 (H) 0.00 - 0.07 K/uL    Comment: Performed at [facility] [address] [place], [place] [address]  Comprehensive metabolic panel     Status: Abnormal   Collection Time: 08/24/19 10:25 AM  Result Value Ref Range   Sodium 131 (L) 135 - 145 mmol/L   Potassium 6.8 (HH) 3.5 - 5.1 mmol/L    Comment: SPECIMEN HEMOLYZED. HEMOLYSIS MAY AFFECT INTEGRITY OF RESULTS. CRITICAL RESULT CALLED TO, READ BACK BY AND VERIFIED WITH: HONNIE VANKRUTCHMIR,RN AT [id number] 08/24/2019 BY ZBEECH.    Chloride 89 (L) 98 - 111 mmol/L   CO2 26 22 - 32 mmol/L   Glucose, Bld 105 (H) 70 - 99 mg/dL   BUN 89 (H) 8 - 23 mg/dL   Creatinine, Ser 5.05 (H) 0.61 - 1.24 mg/dL   Calcium 9.3 8.9 - 10.3 mg/dL   Total Protein 6.3 (L) 6.5 - 8.1 g/dL   Albumin 3.1 (L) 3.5 - 5.0 g/dL   AST 29 15 - 41 U/L   ALT 14 0 - 44 U/L   Alkaline Phosphatase 50 38 - 126 U/L    Total Bilirubin 1.2 0.3 - 1.2 mg/dL   GFR calc non Af Amer 10 (L) >60 mL/min   GFR calc Af Amer 12 (L) >60 mL/min   Anion gap 16 (H) 5 - 15    Comment: Performed at [facility], [address] [place], [place] [address]  Procalcitonin - Baseline     Status: None   Collection Time: 08/24/19 10:25 AM  Result Value Ref Range   Procalcitonin <0.10 ng/mL    Comment:        Interpretation: PCT (Procalcitonin) <= 0.5 ng/mL: Systemic infection (sepsis) is not likely. Local bacterial infection is possible. (NOTE)       Sepsis PCT Algorithm           Lower Respiratory Tract                                      Infection PCT Algorithm    ----------------------------     ----------------------------         PCT < 0.25 ng/mL                PCT < 0.10 ng/mL         Strongly encourage             Strongly discourage   discontinuation of antibiotics    initiation of antibiotics    ----------------------------     -----------------------------       PCT 0.25 - 0.50 ng/mL            PCT 0.10 - 0.25 ng/mL               OR       >80% decrease in PCT            Discourage initiation of  antibiotics      Encourage discontinuation           of antibiotics    ----------------------------     -----------------------------         PCT >= 0.50 ng/mL              PCT 0.26 - 0.50 ng/mL               AND        <80% decrease in PCT             Encourage initiation of                                             antibiotics       Encourage continuation           of antibiotics    ----------------------------     -----------------------------        PCT >= 0.50 ng/mL                  PCT > 0.50 ng/mL               AND         increase in PCT                  Strongly encourage                                      initiation of antibiotics    Strongly encourage escalation           of antibiotics                                      -----------------------------                                           PCT <= 0.25 ng/mL                                                 OR                                        > 80% decrease in PCT                                     Discontinue / Do not initiate                                             antibiotics Performed at [facility], [place] [address], [place], [place] [address]   CK     Status: None   Collection Time: 08/24/19 10:25  AM  Result Value Ref Range   Total CK 61 49 - 397 U/L    Comment: Performed at [facility], [place] [address], [place], [place] [address]  Lactic acid, plasma     Status: Abnormal   Collection Time: 08/24/19 10:41 AM  Result Value Ref Range   Lactic Acid, Venous 2.4 (HH) 0.5 - 1.9 mmol/L    Comment: CRITICAL RESULT CALLED TO, READ BACK BY AND VERIFIED WITH: SARA BERTRAND,RN AT 1131 08/24/2019 BY ZBEECH. Performed at [facility], [place] [address], [place], [place] [address]   CBG monitoring, ED     Status: None   Collection Time: 08/24/19 12:38 PM  Result Value Ref Range   Glucose-Capillary 98 70 - 99 mg/dL   US Renal  Result Date: 08/24/2019 CLINICAL DATA:  Acute renal failure EXAM: RENAL / URINARY TRACT ULTRASOUND COMPLETE COMPARISON:  None. FINDINGS: Somewhat limited exam due to overlying bowel gas. Right Kidney: Renal measurements: 13.3 x 6.6 x 6.7 = volume: 316 mL . Echogenicity within normal limits. No mass or hydronephrosis visualized. Left Kidney: Renal measurements: 11.8 x 6.2 x 7.0 = volume: 266 mL. Echogenicity within normal limits. No mass or hydronephrosis visualized. Bladder: The bladder is partially distended there is a heterogeneous nodular appearance to the prostate which protrudes into the posterior bladder. IMPRESSION: Somewhat limited exam due to overlying bowel gas, however normal appearing kidneys. Enlarged nodular prostate protruding into the posterior bladder. Electronically Signed   By: Prudencio Pair M.D.    On: 08/24/2019 02:18   Dg Chest Port 1 View  Result Date: 08/23/2019 CLINICAL DATA:  Dyspnea EXAM: PORTABLE CHEST 1 VIEW COMPARISON:  06/01/2017 FINDINGS: Cardiomegaly. Mild pulmonary vascular congestion. Calcific aortic knob. Right basilar opacity. No pneumothorax. Advanced degenerative changes of the shoulders. IMPRESSION: 1. Right basilar opacity which may reflect pleural effusion with atelectasis and/or pneumonia. 2. Cardiomegaly and pulmonary vascular congestion suggesting CHF. Electronically Signed   By: Davina Poke M.D.   On: 08/23/2019 17:12    Pending Labs Unresulted Labs (From admission, onward)    Start     Ordered   08/25/19 0500  APTT  Daily,   R     08/24/19 0408   08/25/19 0500  Heparin level (unfractionated)  Daily,   R     08/24/19 0408   08/25/19 0500  CBC  Tomorrow morning,   R     08/24/19 1138   08/25/19 [id number]  Basic metabolic panel  Tomorrow morning,   R     08/24/19 1138   08/25/19 0500  Magnesium  Tomorrow morning,   R     08/24/19 1138   08/25/19 0500  Phosphorus  Tomorrow morning,   R     08/24/19 1138   08/25/19 0500  Brain natriuretic peptide  Tomorrow morning,   R     08/24/19 1138   08/25/19 0500  Lactic acid, plasma  Tomorrow morning,   R     08/24/19 1138   08/25/19 0500  Lactate dehydrogenase  Tomorrow morning,   R     08/24/19 1138   08/25/19 0500  Procalcitonin  Daily,   R     08/24/19 1138   08/24/19 1400  APTT  Once-Timed,   STAT     08/24/19 0408   08/24/19 1400  Heparin level (unfractionated)  Once-Timed,   STAT     08/24/19 0408   08/24/19 1205  Potassium  ONCE - STAT,   STAT  08/24/19 1204   08/24/19 1131  Phosphorus  Once,   STAT     08/24/19 1138   08/24/19 1131  Urine culture  Once,   STAT    Question:  Patient immune status  Answer:  Normal   08/24/19 1138   08/24/19 1131  Strep pneumoniae urinary antigen  (not at [facility])  Once,   STAT     08/24/19 1138   08/24/19 1131  Legionella Pneumophila Serogp 1 Ur Ag  Once,   STAT      08/24/19 1138   08/24/19 1130  Magnesium  Once,   STAT     08/24/19 1138   08/24/19 0938  Cortisol  Once,   STAT     08/24/19 0937   08/24/19 0807  CBC with Differential/Platelet  ONCE - STAT,   STAT     08/24/19 0808   08/23/19 1921  Urine culture  ONCE - STAT,   STAT     08/23/19 1920          Vitals/Pain Today's Vitals   08/24/19 1216 08/24/19 1218 08/24/19 1224 08/24/19 1230  BP: (!) 88/56 (!) 87/63 101/84 126/67  Pulse:   67 (!) 122  Resp:  15 20 (!) 23  Temp:      TempSrc:      SpO2:  92% 100% 94%  Weight:      Height:      PainSc:        Isolation Precautions No active isolations  Medications Medications  cefTRIAXone (ROCEPHIN) 2 g in sodium chloride 0.9 % 100 mL IVPB (0 g Intravenous Stopped 08/23/19 1906)  albuterol (PROVENTIL) (2.5 MG/3ML) 0.083% nebulizer solution 2.5 mg (has no administration in time range)  mometasone-formoterol (DULERA) 100-5 MCG/ACT inhaler 2 puff (2 puffs Inhalation Not Given 08/24/19 0900)  acetaminophen (TYLENOL) tablet 650 mg (has no administration in time range)    Or  acetaminophen (TYLENOL) suppository 650 mg (has no administration in time range)  ondansetron (ZOFRAN) tablet 4 mg (has no administration in time range)    Or  ondansetron (ZOFRAN) injection 4 mg (has no administration in time range)  insulin aspart (novoLOG) injection 0-9 Units (0 Units Subcutaneous Not Given 08/24/19 1241)  heparin ADULT infusion 100 units/mL (25000 units/271mL sodium chloride 0.45%) (1,700 Units/hr Intravenous New Bag/Given 08/24/19 0451)  0.9 %  sodium chloride infusion ( Intravenous New Bag/Given 08/24/19 0950)  0.9 %  sodium chloride infusion (has no administration in time range)  norepinephrine (LEVOPHED) 4mg  in 229mL premix infusion (3 mcg/min Intravenous Rate/Dose Change 08/24/19 1219)  doxycycline (VIBRAMYCIN) 100 mg in sodium chloride 0.9 % 250 mL IVPB (0 mg Intravenous Stopped 08/23/19 1956)  sodium chloride 0.9 % bolus 500 mL (0 mLs  Intravenous Stopped 08/24/19 0937)    Mobility non-ambulatory High fall risk   Focused Assessments Pulmonary Assessment Handoff:  Lung sounds: Bilateral Breath Sounds: Diminished L Breath Sounds: Diminished R Breath Sounds: Diminished O2 Device: Nasal Cannula O2 Flow Rate (L/min): 4 L/min      R Recommendations: See Admitting Provider Note  Report given to:   Additional Notes:

## 2019-08-24 NOTE — H&P (Signed)
NAME:  Charles [name], MRN:  [id number], DOB:  March 22, 1941, LOS: 1 ADMISSION DATE:  08/23/2019, CONSULTATION DATE:  08/24/2019 REFERRING MD:  [facility]ad, CHIEF COMPLAINT:  Hypotension/ Shock  Brief History   78 y.o. male with history of CAD managed medically, chronic diastolic CHF, diabetes mellitus, sleep apnea, COPD, history of CVA, atrial fibrillation who was recently admitted and discharged 3 weeks ago after patient inadvertently took overdose of Lasix. He is very hard of hearing.  Patient was observed in the hospital and eventually discharged home on torsemide.  At the time patient also was started on apixaban for A. fib.  Patient was discharged to rehab.  And eventually was discharged back to home.  Patient wife states that patient has been doing poorly since discharge from the hospital and has gradually experienced worsening  shortness of breath with increasing lower extremity edema.  Two days prior to admission patient had become very short of breath with minimal exertion.  Has been eating poorly.  Has not had any nausea vomiting or diarrhea. Has not had any fever chills.  Did not lose consciousness.  Pt. Was Admitted for acute hypoxic respiratory failure with anasarca,  acute renal failure, and heart failure.  Pt was found to have an infiltrate on CXR and has had very soft BP since admission. Overnight 9/11  BP dropped to 65/40 ( Checked both arms), pt was bolused with 500 cc's IVF and started on maintenance fluids at 100 cc / hr.  As BP will need pressor support , and it appears patient will need higher level of care, PCCM were consulted to admit to ICU and manage care   History of present illness   78 y.o. male with history of CAD managed medically, chronic diastolic CHF, diabetes mellitus, sleep apnea, COPD, history of CVA, atrial fibrillation who was recently admitted and discharged 3 weeks ago after patient inadvertently took overdose of Lasix. He is very hard of hearing.  Patient was observed in  the hospital and eventually discharged home on torsemide.  At the time patient also was started on apixaban for A. fib.  Patient was discharged to rehab.  And eventually was discharged back to home.  Patient wife states that patient has been doing poorly since discharge from the hospital and has gradually experienced worsening  shortness of breath with increasing lower extremity edema.  Two days prior to admission patient had become very short of breath with minimal exertion.  Has been eating poorly.  Has not had any nausea vomiting or diarrhea. Has not had any fever chills.  Did not lose consciousness.  ED Course: In the ER patient was mildly hypotensive initially.  Afebrile.  Patient was hypoxic.  Chest x-ray was shows possible infiltrates and vascular congestion .  COVID-19 test was negative.  Lab work trend shows  the creatinine  has  acutely worsened from 1 on August 09, 2019 to   4.5. on 9/11.  WBC count is  12.3.  UA shows features concerning for UTI with  hyaline cast >> than 50 RBC with rare bacteria.  EKG shows tachycardia.  Lactic acid was 3,  BNP was 1800.  Patient was started on ceftriaxone for possible UTI.  On CPAP.  Admitted for acute hypoxic respiratory failure with anasarca acute renal failure.  Critical care was consulted  when patient's BP dropped below his baseline of [id number] systolic, and there was concern for developing septic shock in addition to the above noted diagnoses.Overnight 9/11  BP dropped to  65/40 ( Checked both arms), pt was bolused with 500 cc's IVF and started on maintenance fluids at 100 cc / hr.  As BP will need pressor support , and it appears patient will need higher level of care, PCCM were consulted to admit to ICU and manage care  Lactate, PCT Cortisol and CK are all pending.   Past Medical History   Past Medical History:  Diagnosis Date   Arthritis    B12 deficiency    CAD (coronary artery disease)    a. Approx. 2000 - MI. Cath: single vsl dz, PTCA  dLAD/medical rx;  b. NSTEMI 11/13 => IVUS attempted for RCA but not successful; anatomy felt stable from 2000 => med Rx. c. [place] 08/2016: severe stable diffuse dLAD, severe prox RCA -> PCI of RCA unsuccessful; d. 05/2017 NSTEMI/Cath: LM nl, LAD 20ost/p, 74m, 80d, LCX 30ost, 20p, 65m, OM2 90, RCA 80p, 99d.   Cancer [facility])    skin cancer- head    Cataracts    Cholelithiasis    Chronic diastolic CHF (congestive heart failure) ([place])    a. 05/2017 Echo: EF 60-65%, Gr2 DD, mildly dil Ao, triv MR, mildly to mod dil LA, mild PR.   Claustrophobia    COPD (chronic obstructive pulmonary disease) ([name])    CVA (cerebral vascular accident) ([place]) ~ 2001   vision inparted from stroke.  Visual Memory loss   Diabetic neuropathy ([place])    Diverticulitis    Dysrhythmia    if he does not take Metoprolol- Afib   ED (erectile dysfunction)    Essential hypertension    History of MRSA infection    Right foot   History of stomach ulcers    HOH (hard of hearing)    Hypercholesterolemia    Legally blind    Lower extremity edema    Morbid obesity ([name])    Neuropathy    NSTEMI (non-ST elevated myocardial infarction) ([place]) 05/2017   OSA on CPAP    PAF (paroxysmal atrial fibrillation) ([place])    a. confirmed by event monitor. b. 02/2014 rash on Coumadin, patient decided to discontinue Xarelto due to possible rash, cost and lawyers ads on TV, agreed to take Plavix.   Pneumonia 2016   Tunnel vision    Since stroke   Type II diabetes mellitus ([place])    Venous stasis    Weakness 01/11/2017    Significant Hospital Events   Admission 9/10  Consults:  9/11 PCCM  Procedures:    Significant Diagnostic Tests:   9/11 Echo>> Pending 05/2017>> Echo EF 0000000 II diastolic dysfunction, right ventricular systolic pressure was increased consistent with mild pulmonary hypertension.( PA Peak Pressures 40 mm Hg  Micro Data:  9/10 Urine Culture>> 9/10 Blood Culture>> 9/10  SARS Coronavirus  2 NEGATIVE    Antimicrobials:  Doxycycline x 1 dose> 08/23/2019 Rocephin>>9/10>>  Interim history/subjective:  Pt. States he has no appetite. He denies pain. Afebrile  Hypotensive after 500 cc bolus and maintenance IVF of 100 cc per hour Peripheral Levophed initiated  Objective   Blood pressure 97/68, pulse 85, temperature (!) 97.4 F (36.3 C), temperature source Oral, resp. rate (!) 21, height 6\' 4"  (1.93 m), weight 132.9 kg, SpO2 94 %.    FiO2 (%):  [3 %] 3 %   Intake/Output Summary (Last 24 hours) at 08/24/2019 1126 Last data filed at 08/24/2019 0859 Gross per 24 hour  Intake 100 ml  Output 148 ml  Net -48 ml   Filed Weights   08/23/19 1716  08/23/19 1920  Weight: 132.9 kg 132.9 kg    Examination: General: Elderly male, very HOH, supine in bed, confused, hypotensive  HENT: NCAT, facial hair, + JVD, No LAD Lungs: Diminished per L side, Bilateral chest excursion, few rhonchi, No wheeze, stridor Cardiovascular: S1, S2, Irr rhythm, NO RMG, A fib per tele Abdomen: Large, NT, ND,BS diminished, bruising noted Skin:Cellulitis like area to left hip, no drainage, BLE with changes of venous stasis with 2+  edema and oozing,tender to touch Extremities: Right toes amputated, suture line clean, L foot with areas of breakdown , sluggish capillary refill Neuro: Very HOH, awake and confused, MAE x 4, Oriented to self and place only GU: Foley cath with tea colored urine  Resolved Hospital Problem list     Assessment & Plan:  Shock with Hypotension ( ? Septic) Baseline BP is in the 90's per wife CHF>> BNP of 1800 CAD with medical management PFO ? LV Clot Atrial fibrillation Hyperlipidemia Plan Start peripheral Levophed titrate for MAP of > 65 mm Hg Heparin gtt per pharmacy Trend Lactate Echo now Cortisol CVVHD per renal for volume removal Arterial Line  Acute Kidney Injury Normal creatinine prior to accidental overdose of Lasix  Urine output is poor , urine is tea  colored Hyperkalemia Plan Place HD cath Renal Consult CVVHD per renal Trend BMET Avoid nephrotoxic medications Maintain renal perfusion Monitor UO Trend CK  Pleural Effusion vs infiltrate COPD OSA Afebrile WBC 12.4 Plan Oxygen to maintain sats of 88-92% Volume removal per CVVHD ABX as above PCT Trend WBC and fever curve Blood Cx x 2 CPAP at bedtime per home protocol Follow micro  Cellulitis per L hip abdomen Plan Culture drainage ABX as above PCT  Possible UTI Plan Urine Culture Follow micro PCT  Anemia of chronic illness Plan Trend CBC Monitor for bleeding  Transfuse for HGB < 7  DM Plan CBG SSI  Hx CVA/ AMS Plan  Neuro checks per unit protocol  Best practice:  Diet: NPO Pain/Anxiety/Delirium protocol (if indicated): NA VAP protocol (if indicated): NA DVT prophylaxis: Heparin gtt GI prophylaxis: None Glucose control: CBG/ SSI Mobility: BR Code Status: DNR Family Communication: Wife updated at bedside at length Disposition: ICU   Labs   CBC: Recent Labs  Lab 08/23/19 1655 08/23/19 2359 08/24/19 1025  WBC 12.3*  --  12.4*  NEUTROABS 11.0*  --  11.0*  HGB 10.8* 10.9* 10.5*  HCT 33.4* 32.0* 33.7*  MCV 93.0  --  95.7  PLT 249  --  [id number]    Basic Metabolic Panel: Recent Labs  Lab 08/23/19 1655 08/23/19 2359  NA 132* 129*  K 5.4* 5.4*  CL 88*  --   CO2 28  --   GLUCOSE 127*  --   BUN 79*  --   CREATININE 4.58*  --   CALCIUM 9.5  --    GFR: Estimated Creatinine Clearance: 20.1 mL/min (A) (by C-G formula based on SCr of 4.58 mg/dL (H)). Recent Labs  Lab 08/23/19 1655 08/23/19 1721 08/23/19 2005 08/24/19 1025  WBC 12.3*  --   --  12.4*  LATICACIDVEN  --  3.0* 3.1*  --     Liver Function Tests: Recent Labs  Lab 08/23/19 1655  AST 22  ALT 14  ALKPHOS 55  BILITOT 1.0  PROT 6.4*  ALBUMIN 3.0*   No results for input(s): LIPASE, AMYLASE in the last 168 hours. No results for input(s): AMMONIA in the last 168  hours.  ABG  Component Value Date/Time   PHART 7.511 (H) 08/23/2019 2359   PCO2ART 38.7 08/23/2019 2359   PO2ART 32.0 (LL) 08/23/2019 2359   HCO3 30.9 (H) 08/23/2019 2359   TCO2 32 08/23/2019 2359   ACIDBASEDEF 2.0 12/26/2015 1327   O2SAT 67.0 08/23/2019 2359     Coagulation Profile: Recent Labs  Lab 08/23/19 1845  INR 2.3*    Cardiac Enzymes: No results for input(s): CKTOTAL, CKMB, CKMBINDEX, TROPONINI in the last 168 hours.  HbA1C: Hgb A1c MFr Bld  Date/Time Value Ref Range Status  05/25/2019 01:53 PM 7.2 (H) 4.6 - 6.5 % Final    Comment:    Glycemic Control Guidelines for People with Diabetes:Non Diabetic:  <6%Goal of Therapy: <7%Additional Action Suggested:  >8%   02/21/2019 03:16 PM 7.4 (H) 4.6 - 6.5 % Final    Comment:    Glycemic Control Guidelines for People with Diabetes:Non Diabetic:  <6%Goal of Therapy: <7%Additional Action Suggested:  >8%     CBG: Recent Labs  Lab 08/23/19 2341 08/24/19 0328 08/24/19 0831  GLUCAP 107* 95 98    Review of Systems:   Gen: Denies fever, chills, weight change, + fatigue, night sweats HEENT: Denies blurred vision, double vision, ++hearing loss, No tinnitus, sinus congestion, rhinorrhea, sore throat, neck stiffness, dysphagia PULM: +  shortness of breath, No cough, sputum production, hemoptysis, wheezing CV: Denies chest pain, edema, orthopnea, paroxysmal nocturnal dyspnea, palpitations GI: Denies abdominal pain, nausea, vomiting, diarrhea, hematochezia, melena, constipation, change in bowel habits GU: Denies dysuria, hematuria, polyuria, oliguria, urethral discharge Endocrine: Denies hot or cold intolerance, polyuria, polyphagia or appetite change Derm: Denies rash, dry skin, scaling or peeling skin change, + cellulitis L hip Heme: +  easy bruising, No bleeding, bleeding gums Neuro: Denies headache, numbness, weakness, slurred speech, loss of memory or consciousness  Past Medical History  He,  has a past medical  history of Arthritis, B12 deficiency, CAD (coronary artery disease), Cancer ([place]), Cataracts, Cholelithiasis, Chronic diastolic CHF (congestive heart failure) ([place]), Claustrophobia, COPD (chronic obstructive pulmonary disease) ([place]), CVA (cerebral vascular accident) ([place]) (~ 2001), Diabetic neuropathy ([place]), Diverticulitis, Dysrhythmia, ED (erectile dysfunction), Essential hypertension, History of MRSA infection, History of stomach ulcers, HOH (hard of hearing), Hypercholesterolemia, Legally blind, Lower extremity edema, Morbid obesity ([place]), Neuropathy, NSTEMI (non-ST elevated myocardial infarction) ([place]) (05/2017), OSA on CPAP, PAF (paroxysmal atrial fibrillation) ([place]), Pneumonia (2016), Tunnel vision, Type II diabetes mellitus ([place]), Venous stasis, and Weakness (01/11/2017).   Surgical History    Past Surgical History:  Procedure Laterality Date   CARDIAC CATHETERIZATION  1990's   CARDIAC CATHETERIZATION N/A 08/17/2016   Procedure: Left Heart Cath and Coronary Angiography;  Surgeon: Peter M Martinique, MD;  Location: [place] CV [facility];  Service: Cardiovascular;  Laterality: N/A;   CARDIAC CATHETERIZATION N/A 08/19/2016   Procedure: Coronary Balloon Angioplasty;  Surgeon: Peter M Martinique, MD;  Location: [place] [facility];  Service: Cardiovascular;  Laterality: N/A;   CATARACT EXTRACTION  10/2016   CEREBRAL ANGIOGRAM  ~ 2000   COLONOSCOPY     LEFT HEART CATH AND CORONARY ANGIOGRAPHY N/A 06/01/2017   Procedure: Left Heart Cath and Coronary Angiography;  Surgeon: Burnell Blanks, MD;  Location: [place] [facility];  Service: Cardiovascular;  Laterality: N/A;   LEFT HEART CATHETERIZATION WITH CORONARY ANGIOGRAM N/A 10/27/2012   Procedure: LEFT HEART CATHETERIZATION WITH CORONARY ANGIOGRAM;  Surgeon: Burnell Blanks, MD;  Location: [facility] CATH LAB;  Service: Cardiovascular;  Laterality: N/A;   TOE AMPUTATION  2006; 2009   "  Dr. 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 His smoking use included cigarettes and cigars. He has a 30.00 pack-year smoking history. He quit smokeless tobacco use about 3 years ago. He reports current alcohol use. He reports that he does not use drugs.   Family History   His family history includes Breast cancer (age of onset: 1) in his mother; Heart disease (age of onset: 75) in his father. There is no history of Asthma.   Allergies Allergies  Allergen Reactions   Tolterodine Swelling and Other (See Comments)    (Detrol) Dry mouth/dry throat, severe swelling of the legs, and started a "downward spiral"   Lipitor [Atorvastatin] Other (See Comments)    Muscle aches   Xarelto [Rivaroxaban] Rash     Home Medications  Prior to Admission medications   Medication Sig Start Date End Date Taking? Authorizing Provider  acetaminophen (TYLENOL) 500 MG tablet Take 1,000 mg by mouth at bedtime.    Yes [provider]  albuterol (VENTOLIN HFA) 108 (90 Base) MCG/ACT inhaler Inhale 1-2 puffs into the lungs every 6 (six) hours as needed for wheezing or shortness of breath.   Yes [provider]  apixaban (ELIQUIS) 5 MG TABS tablet Take 1 tablet (5 mg total) by mouth 2 (two) times daily. 08/08/19  Yes Mercy Riding, MD  aspirin EC 81 MG tablet Take 81 mg by mouth daily.   Yes [provider]  Cholecalciferol (VITAMIN D-3) 25 MCG (1000 UT) CAPS Take 1,000 Units by mouth daily.   Yes [provider]  Fluticasone-Salmeterol (ADVAIR DISKUS) 100-50 MCG/DOSE AEPB Inhale 1 puff into the lungs 2 (two) times daily. 03/02/16  Yes Plotnikov, Evie Lacks, MD  gabapentin  (NEURONTIN) 300 MG capsule TAKE 1 CAPSULE THREE TIMES DAILY Patient taking differently: Take 300 mg by mouth 2 (two) times daily.  08/04/18  Yes Plotnikov, Evie Lacks, MD  insulin aspart (NOVOLOG) 100 UNIT/ML FlexPen Inject 20 Units into the skin 4 (four) times daily - after meals and at bedtime. Follow SS Patient taking differently: Inject 1-5 Units into the skin See admin instructions. Inject 1-5 units into the skin three to four times a day, per sliding scale 08/23/18  Yes Plotnikov, Evie Lacks, MD  isosorbide mononitrate (IMDUR) 60 MG 24 hr tablet TAKE 1 TABLET EVERY DAY Patient taking differently: Take 60 mg by mouth daily.  05/08/19  Yes Plotnikov, Evie Lacks, MD  magnesium oxide (MAG-OX) 400 (241.3 Mg) MG tablet Take 1 tablet (400 mg total) by mouth 2 (two) times daily. 08/09/19  Yes Mercy Riding, MD  metFORMIN (GLUCOPHAGE) 500 MG tablet TAKE 1 TABLET TWICE DAILY WITH A MEAL Patient taking differently: Take 500 mg by mouth 2 (two) times daily with a meal.  05/08/19  Yes Plotnikov, Evie Lacks, MD  metoprolol tartrate (LOPRESSOR) 25 MG tablet Take 1 tablet (25 mg total) by mouth 2 (two) times daily. 08/04/18  Yes Plotnikov, Evie Lacks, MD  mupirocin ointment (BACTROBAN) 2 % Apply 1 application topically See admin instructions. Apply to affected areas of bilateral feet, ankles, and legs once daily  Yes [provider]  nitroGLYCERIN (NITROSTAT) 0.4 MG SL tablet DISSOLVE 1 TABLET (0.4 MG TOTAL) UNDER THE TONGUE EVERY 5 (FIVE) MINUTES AS NEEDED (UP TO 3 DOSES) FOR CHEST PAIN Patient taking differently: Place 0.4 mg under the tongue every 5 (five) minutes as needed for chest pain.  05/08/19  Yes Plotnikov, Evie Lacks, MD  pravastatin (PRAVACHOL) 40 MG tablet TAKE 1 TABLET EVERY DAY Patient taking differently: Take 40 mg by mouth at bedtime.  05/08/19  Yes Plotnikov, Evie Lacks, MD  PRESCRIPTION MEDICATION See admin instructions. CPAP- At bedtime   Yes [provider]  ranolazine (RANEXA) 500  MG 12 hr tablet TAKE 1 TABLET TWICE DAILY Patient taking differently: Take 500 mg by mouth 2 (two) times daily.  08/03/18  Yes Plotnikov, Evie Lacks, MD  torsemide (DEMADEX) 20 MG tablet Take 40 mg by mouth See admin instructions. Take 40 mg by mouth two times a day and additional 20 mg once daily until desired amount of output of urine is achieved   Yes [provider]  triamcinolone ointment (KENALOG) 0.5 % Apply 1 application topically 2 (two) times daily. Patient taking differently: Apply 1 application topically 2 (two) times daily as needed (for itching).  02/21/19 02/21/20 Yes Plotnikov, Evie Lacks, MD  vitamin B-12 (CYANOCOBALAMIN) 1000 MCG tablet Take 1,000 mcg by mouth daily.    Yes [provider]  collagenase (SANTYL) ointment Apply 1 application topically daily. 11/29/16   Trula Slade, DPM  doxycycline (VIBRA-TABS) 100 MG tablet Take 1 tablet (100 mg total) by mouth every 12 (twelve) hours. Patient not taking: Reported on 08/23/2019 08/08/19   Mercy Riding, MD  torsemide (DEMADEX) 100 MG tablet Take 1 tablet (100 mg total) by mouth daily. 08/21/19   Plotnikov, Evie Lacks, MD     Critical care time: 74 mionutes     Dr. Lamonte Sakai and I spoke with patient's wife at length 9/11. This is a second marriage and the patient has children from a previous marriage. She has spoken with the patient's children and has made the patient a DNR as he has a very significant cardiac history. She is in agreement with short term CVVHD and insertion of HD cath.If the patient's kidneys do not rebound, she is not inclined to continue to pursue aggressive treatment measures. There will need to be ongoing goals of care discussions to align goals of care with plan of care.Magdalen Spatz, AGACNP-BC [place] Pager # [phone] After 4 pm please call [phone] 08/24/2019 2:45 PM

## 2019-08-24 NOTE — ED Notes (Signed)
Pt placed on hospital bed at this time for comfort  

## 2019-08-24 NOTE — Progress Notes (Signed)
  Echocardiogram 2D Echocardiogram has been performed.  One[place] Deputy Charles [name] 08/24/2019, 2:56 PM

## 2019-08-24 NOTE — ED Notes (Signed)
Dr. Hal Hope informed of low PO2 32.0. Per MD to inform of any follow up action required

## 2019-08-24 NOTE — Progress Notes (Signed)
ABG drawn, Po02 indicative of venous sample.  RN aware of ABG results.  Ask to let RT know if MD wants RT to stick again for arterial sample.

## 2019-08-25 ENCOUNTER — Inpatient Hospital Stay (HOSPITAL_COMMUNITY): Payer: Medicare Other

## 2019-08-25 DIAGNOSIS — I959 Hypotension, unspecified: Secondary | ICD-10-CM

## 2019-08-25 DIAGNOSIS — I2510 Atherosclerotic heart disease of native coronary artery without angina pectoris: Secondary | ICD-10-CM

## 2019-08-25 LAB — GLUCOSE, CAPILLARY
Glucose-Capillary: 101 mg/dL — ABNORMAL HIGH (ref 70–99)
Glucose-Capillary: 113 mg/dL — ABNORMAL HIGH (ref 70–99)
Glucose-Capillary: 114 mg/dL — ABNORMAL HIGH (ref 70–99)
Glucose-Capillary: 98 mg/dL (ref 70–99)
Glucose-Capillary: 98 mg/dL (ref 70–99)
Glucose-Capillary: 99 mg/dL (ref 70–99)

## 2019-08-25 LAB — RENAL FUNCTION PANEL
Albumin: 3 g/dL — ABNORMAL LOW (ref 3.5–5.0)
Albumin: 2.9 g/dL — ABNORMAL LOW (ref 3.5–5.0)
Anion gap: 13 (ref 5–15)
Anion gap: 10 (ref 5–15)
BUN: 77 mg/dL — ABNORMAL HIGH (ref 8–23)
BUN: 49 mg/dL — ABNORMAL HIGH (ref 8–23)
CO2: 26 mmol/L (ref 22–32)
CO2: 27 mmol/L (ref 22–32)
Calcium: 8.5 mg/dL — ABNORMAL LOW (ref 8.9–10.3)
Calcium: 8.2 mg/dL — ABNORMAL LOW (ref 8.9–10.3)
Chloride: 95 mmol/L — ABNORMAL LOW (ref 98–111)
Chloride: 97 mmol/L — ABNORMAL LOW (ref 98–111)
Creatinine, Ser: 4.01 mg/dL — ABNORMAL HIGH (ref 0.61–1.24)
Creatinine, Ser: 2.67 mg/dL — ABNORMAL HIGH (ref 0.61–1.24)
GFR calc Af Amer: 16 mL/min — ABNORMAL LOW (ref >60)
GFR calc Af Amer: 26 mL/min — ABNORMAL LOW (ref >60)
GFR calc non Af Amer: 13 mL/min — ABNORMAL LOW (ref >60)
GFR calc non Af Amer: 22 mL/min — ABNORMAL LOW (ref >60)
Glucose, Bld: 111 mg/dL — ABNORMAL HIGH (ref 70–99)
Glucose, Bld: 112 mg/dL — ABNORMAL HIGH (ref 70–99)
Phosphorus: 4.8 mg/dL — ABNORMAL HIGH (ref 2.5–4.6)
Phosphorus: 3.4 mg/dL (ref 2.5–4.6)
Potassium: 4.9 mmol/L (ref 3.5–5.1)
Potassium: 4.4 mmol/L (ref 3.5–5.1)
Sodium: 134 mmol/L — ABNORMAL LOW (ref 135–145)
Sodium: 134 mmol/L — ABNORMAL LOW (ref 135–145)

## 2019-08-25 LAB — CBC
HCT: 31.6 % — ABNORMAL LOW (ref 39.0–52.0)
Hemoglobin: 9.8 g/dL — ABNORMAL LOW (ref 13.0–17.0)
MCH: 29.5 pg (ref 26.0–34.0)
MCHC: 31 g/dL (ref 30.0–36.0)
MCV: 95.2 fL (ref 80.0–100.0)
Platelets: 211 10*3/uL (ref 150–400)
RBC: 3.32 MIL/uL — ABNORMAL LOW (ref 4.22–5.81)
RDW: 16.1 % — ABNORMAL HIGH (ref 11.5–15.5)
WBC: 9.7 10*3/uL (ref 4.0–10.5)
nRBC: 0 % (ref 0.0–0.2)

## 2019-08-25 LAB — URINE CULTURE
Culture: NO GROWTH
Special Requests: NORMAL

## 2019-08-25 LAB — MAGNESIUM: Magnesium: 2.1 mg/dL (ref 1.7–2.4)

## 2019-08-25 LAB — LACTIC ACID, PLASMA: Lactic Acid, Venous: 1.4 mmol/L (ref 0.5–1.9)

## 2019-08-25 LAB — PROCALCITONIN: Procalcitonin: <0.1 ng/mL

## 2019-08-25 LAB — HEPARIN LEVEL (UNFRACTIONATED)
Heparin Unfractionated: >2.2 IU/mL — ABNORMAL HIGH (ref 0.30–0.70)
Heparin Unfractionated: >2.2 IU/mL — ABNORMAL HIGH (ref 0.30–0.70)

## 2019-08-25 LAB — BRAIN NATRIURETIC PEPTIDE: B Natriuretic Peptide: 878.3 pg/mL — ABNORMAL HIGH (ref 0.0–100.0)

## 2019-08-25 LAB — APTT: aPTT: 137 seconds — ABNORMAL HIGH (ref 24–36)

## 2019-08-25 LAB — LACTATE DEHYDROGENASE: LDH: 199 U/L — ABNORMAL HIGH (ref 98–192)

## 2019-08-25 NOTE — Procedures (Signed)
I evaluated the patient while on CRRT and discussed with bedside RN.  Dialysis is running without issue. 

## 2019-08-25 NOTE — Progress Notes (Signed)
[facility]    NEPHROLOGY PROGRESS NOTE  SUBJECTIVE: Patient seen and examined.  Reports feeling much better today.  Denies shortness of breath.  Denies fevers, chills, chest pain, or other symptoms.  Hematuria noted.   OBJECTIVE:  Vitals:   08/25/19 1000 08/25/19 1100  BP: 98/67 102/60  Pulse: 75 69  Resp: 15 16  Temp:    SpO2: 97% 100%    Intake/Output Summary (Last 24 hours) at 08/25/2019 1147 Last data filed at 08/25/2019 1100 Gross per 24 hour  Intake 412.25 ml  Output 1021 ml  Net -608.75 ml      General:  Lethargic, NAD HEENT: MMM [place] AT anicteric sclera Neck:  positive JVD, no adenopathy, right IJ temporary dialysis catheter CV:  Heart RRR  Lungs:  Decreased BS bilaterally Abd:  abd SNT/ND with normal BS, obese GU:  Bladder non-palpable Extremities:  (+)2 LE edema. Skin:  No skin rash  MEDICATIONS:  . Chlorhexidine Gluconate Cloth  6 each Topical Daily  . insulin aspart  0-9 Units Subcutaneous Q4H  . mometasone-formoterol  2 puff Inhalation BID       LABS:   CBC Latest Ref Rng & Units 08/25/2019 08/24/2019 08/23/2019  WBC 4.0 - 10.5 K/uL 9.7 12.4(H) -  Hemoglobin 13.0 - 17.0 g/dL 9.8(L) 10.5(L) 10.9(L)  Hematocrit 39.0 - 52.0 % 31.6(L) 33.7(L) 32.0(L)  Platelets 150 - 400 K/uL 211 237 -    CMP Latest Ref Rng & Units 08/25/2019 08/24/2019 08/24/2019  Glucose 70 - 99 mg/dL 111(H) 115(H) 105(H)  BUN 8 - 23 mg/dL 77(H) 93(H) 89(H)  Creatinine 0.61 - 1.24 mg/dL 4.01(H) 5.12(H) 5.05(H)  Sodium 135 - 145 mmol/L 134(L) 132(L) 131(L)  Potassium 3.5 - 5.1 mmol/L 4.9 5.4(H) 6.8(HH)  Chloride 98 - 111 mmol/L 95(L) 90(L) 89(L)  CO2 22 - 32 mmol/L 26 28 26   Calcium 8.9 - 10.3 mg/dL 8.5(L) 8.9 9.3  Total Protein 6.5 - 8.1 g/dL - - 6.3(L)  Total Bilirubin 0.3 - 1.2 mg/dL - - 1.2  Alkaline Phos 38 - 126 U/L - - 50  AST 15 - 41 U/L - - 29  ALT 0 - 44 U/L - - 14    Lab Results  Component Value Date   CALCIUM 8.5 (L) 08/25/2019   CAION 1.12 (L)  08/23/2019   PHOS 4.8 (H) 08/25/2019       Component Value Date/Time   COLORURINE AMBER (A) 08/23/2019 2231   APPEARANCEUR HAZY (A) 08/23/2019 2231   LABSPEC 1.017 08/23/2019 2231   PHURINE 5.0 08/23/2019 2231   GLUCOSEU NEGATIVE 08/23/2019 2231   GLUCOSEU NEGATIVE 05/25/2019 1353   HGBUR MODERATE (A) 08/23/2019 2231   BILIRUBINUR NEGATIVE 08/23/2019 2231   KETONESUR NEGATIVE 08/23/2019 2231   PROTEINUR 100 (A) 08/23/2019 2231   UROBILINOGEN 1.0 05/25/2019 1353   NITRITE NEGATIVE 08/23/2019 2231   LEUKOCYTESUR SMALL (A) 08/23/2019 2231      Component Value Date/Time   PHART 7.511 (H) 08/23/2019 2359   PCO2ART 38.7 08/23/2019 2359   PO2ART 32.0 (LL) 08/23/2019 2359   HCO3 30.9 (H) 08/23/2019 2359   TCO2 32 08/23/2019 2359   ACIDBASEDEF 2.0 12/26/2015 1327   O2SAT 67.0 08/23/2019 2359       Component Value Date/Time   IRON 47 09/12/2014 1136   IRONPCTSAT 15.1 (L) 09/12/2014 1136       ASSESSMENT/PLAN:      Acute kidney injury.  Urine appears to have blood protein and WBCs.  Urine culture sent.  [place]  without obstruction but nodular prostate noted.  Patient appears to be hypotensive with anasarca.  Continue foley.  Avoid nephrotoxins.  Avoid ACE inhibitors ARB's.  Avoid contrast.  Avoid intra-arterial procedures.    On CRRT.  Increase in urine output noted.  We will hold CRRT once filter clots.  Hypotension.  Improved, now off of pressors.  Echocardiogram with moderate, fixed thrombus on apical wall of the left ventricle, ejection fraction of 45 to 50%, and RV volume overload.  Respiratory failure now on BiPAP.    Improving oxygenation.  Suspect component of volume overload.  Atrial fibrillation continues on anticoagulation.  Anemia does not appear to be an issue at this particular time.  Continue to monitor  Diabetes mellitus per primary team  Coronary artery disease per primary team  Possible urinary tract infection being treated with Rocephin and  doxycycline.  Hyperkalemia.  Improved.    [place], DO, [place]

## 2019-08-25 NOTE — Progress Notes (Addendum)
Kennieth Rad, NP at bedside to reposition HD cath for CRRT, & during this time made aware of output of blood from urinary catheter.  Grier Mitts, [facility] notified of bleeding & the stopping of Heparin infusion.

## 2019-08-25 NOTE — Progress Notes (Addendum)
NAME:  Charles [name], MRN:  [id number], DOB:  [birth year], LOS: 2 ADMISSION DATE:  08/23/2019, CONSULTATION DATE:  08/24/2019 REFERRING MD:  [place], CHIEF COMPLAINT:  Hypotension/ Shock  Brief History   78 y.o. male with history of CAD managed medically, chronic diastolic CHF, diabetes mellitus, sleep apnea, COPD, history of CVA, atrial fibrillation who was recently admitted and discharged 3 weeks ago after patient inadvertently took overdose of Lasix.    Patient was observed in the hospital and eventually discharged to rehab on torsemide.  At the time patient also was started on apixaban for A. fib.   And eventually was discharged back to home.  Patient wife states that patient has been doing poorly since discharge from the hospital and has gradually experienced worsening  shortness of breath with increasing lower extremity edema.  Two days prior to admission patient had become very short of breath with minimal exertion.  Has been eating poorly.  Has not had any nausea vomiting or diarrhea. Has not had any fever chills.  Did not lose consciousness.  Pt. Was Admitted for acute hypoxic respiratory failure with anasarca,  acute renal failure, and heart failure.  Pt was found to have an infiltrate on CXR and has had very soft BP since admission. Overnight 9/11  BP dropped to 65/40 ( Checked both arms), pt was bolused with 500 cc's IVF and started on maintenance fluids at 100 cc / hr.  As BP will need pressor support , and it appears patient will need higher level of care, PCCM were consulted to admit to ICU and manage care   History of present illness   Increasing SOB, LE edema, dyspnea on minimal exertion.   ED Course: In the ER patient was mildly hypotensive initially.  Afebrile.  Patient was hypoxic.  Chest x-ray was shows possible infiltrates and vascular congestion .  COVID-19 test was negative.  Lab work trend shows  the creatinine  has  acutely worsened from 1 on August 09, 2019 to   4.5. on 9/11.   WBC count is  12.3.  UA shows features concerning for UTI with  hyaline cast >> than 50 RBC with rare bacteria.  EKG shows tachycardia.  Lactic acid was 3,  BNP was 1800.  Patient was started on ceftriaxone for possible UTI.  On CPAP.  Admitted for acute hypoxic respiratory failure with anasarca acute renal failure.  Critical care was consulted  when patient's BP dropped below his baseline of [id number] systolic, and there was concern for developing septic shock in addition to the above noted diagnoses.Overnight 9/11  BP dropped to 65/40 ( Checked both arms), pt was bolused with 500 cc's IVF and started on maintenance fluids at 100 cc / hr.  As BP will need pressor support , and it appears patient will need higher level of care, PCCM were consulted to admit to ICU and manage care  Lactate, PCT Cortisol and CK are all pending.   Past Medical History   Past Medical History:  Diagnosis Date   Arthritis    B12 deficiency    CAD (coronary artery disease)    a. Approx. 2000 - MI. Cath: single vsl dz, PTCA dLAD/medical rx;  b. NSTEMI 11/13 => IVUS attempted for RCA but not successful; anatomy felt stable from 2000 => med Rx. c. [place] 08/2016: severe stable diffuse dLAD, severe prox RCA -> PCI of RCA unsuccessful; d. 05/2017 NSTEMI/Cath: LM nl, LAD 20ost/p, 77m, 80d, LCX 30ost, 20p, 56m, OM2 90, RCA  80p, 99d.   Cancer [facility])    skin cancer- head    Cataracts    Cholelithiasis    Chronic diastolic CHF (congestive heart failure) ([place])    a. 05/2017 Echo: EF 60-65%, Gr2 DD, mildly dil Ao, triv MR, mildly to mod dil LA, mild PR.   Claustrophobia    COPD (chronic obstructive pulmonary disease) ([name])    CVA (cerebral vascular accident) ([place]) ~ 2001   vision inparted from stroke.  Visual Memory loss   Diabetic neuropathy ([place])    Diverticulitis    Dysrhythmia    if he does not take Metoprolol- Afib   ED (erectile dysfunction)    Essential hypertension    History of MRSA infection    Right foot    History of stomach ulcers    HOH (hard of hearing)    Hypercholesterolemia    Legally blind    Lower extremity edema    Morbid obesity ([name])    Neuropathy    NSTEMI (non-ST elevated myocardial infarction) ([place]) 05/2017   OSA on CPAP    PAF (paroxysmal atrial fibrillation) ([place])    a. confirmed by event monitor. b. 02/2014 rash on Coumadin, patient decided to discontinue Xarelto due to possible rash, cost and lawyers ads on TV, agreed to take Plavix.   Pneumonia 2016   Tunnel vision    Since stroke   Type II diabetes mellitus ([place])    Venous stasis    Weakness 01/11/2017    Significant Hospital Events   Admission 9/10  Consults:  9/11 PCCM  Procedures:    Significant Diagnostic Tests:   9/11 Echo>> Pending 05/2017>> Echo EF 0000000 II diastolic dysfunction, right ventricular systolic pressure was increased consistent with mild pulmonary hypertension.( PA Peak Pressures 40 mm Hg  Micro Data:  9/10 Urine Culture>> 9/10 Blood Culture>> 9/10  SARS Coronavirus 2 NEGATIVE    Antimicrobials:  Doxycycline x 1 dose> 08/23/2019 Rocephin>>9/10>>  Interim history/subjective:  Pt. States he has no appetite. He denies pain. Afebrile  Hypotensive after 500 cc bolus and maintenance IVF of 100 cc per hour Peripheral Levophed initiated  Objective   Blood pressure 106/66, pulse 82, temperature (!) 97 F (36.1 C), temperature source Axillary, resp. rate 17, height 6\' 4"  (1.93 m), weight 132 kg, SpO2 95 %.        Intake/Output Summary (Last 24 hours) at 08/25/2019 0844 Last data filed at 08/25/2019 0800 Gross per 24 hour  Intake 287.25 ml  Output 808 ml  Net -520.75 ml   Filed Weights   08/23/19 1920 08/24/19 1500 08/25/19 0407  Weight: 132.9 kg 126.8 kg 132 kg    Examination: General: Elderly male, very HOH, supine in bed.  Alert, oriented to self. Slightly slow to speak, but he is making sense.  Marland Kitchen  HENT: NCAT No LAD Lungs: CTAB Cardiovascular: S1,  S2, Irr rhythm, NO RMG,  Abdomen: Large, NT, ND,BS diminished, bruising noted Skin: BLE with changes of venous stasis with 2+  edema and oozing,tender to touch,  Extremities: Right toes amputated, suture line clean, L foot with areas of breakdown , lmultiple digit amputations on L, R TMA amputation Neuro: Very HOH, awake,Oriented to self and place  GU: Foley cath with tea colored urine  Resolved Hospital Problem list     Assessment & Plan:  Hypotension Baseline BP is in the 90's per wife CHF>> BNP of 1800 CAD with medical management PFO  LV Clot, apical aneurism.  Atrial fibrillation Hyperlipidemia Plan Cont to  monitor.   Now off levophed since yesterday at noon.  Trend Lactate Echo now Cortisol CVVHD per renal for volume removal Monitor UOP, net negative 585ml today.  Cont heparin per protocol.  Restart eliquis when more stable.   Acute Kidney Injury Normal creatinine prior to accidental overdose of Lasix  Urine output is poor , urine is tea colored Hyperkalemia Plan Place HD cath Appreciate Renal Consult CVVHD per renal Trend BMET Avoid nephrotoxic medications Maintain renal perfusion Monitor UO Trend CK  Pleural Effusion vs infiltrate COPD OSA Afebrile WBC 12.4  - downtrending Plan Oxygen to maintain sats of 88-92% Volume removal per CVVHD ABX as above - cefepime  PCT Trend WBC and fever curve Blood Cx x 2 CPAP at bedtime per home protocol Follow micro  Cellulitis per L hip abdomen Plan Culture drainage - p ABX as above PCT  Possible UTI Plan Urine Culture Follow micro PCT  Anemia of chronic illness Plan Trend CBC Monitor for bleeding  Transfuse for HGB < 7  DM Plan CBG SSI  Hx CVA/ AMS Plan  Neuro checks per unit protocol  Best practice:  Diet: diet as able.  Bedside swallow eval today.  Pain/Anxiety/Delirium protocol (if indicated): NA VAP protocol (if indicated): NA DVT prophylaxis: Heparin gtt GI prophylaxis:  None Glucose control: CBG/ SSI Mobility: BR Code Status: DNR Family Communication:  Disposition: ICU   Labs   CBC: Recent Labs  Lab 08/23/19 1655 08/23/19 2359 08/24/19 1025 08/25/19 0343  WBC 12.3*  --  12.4* 9.7  NEUTROABS 11.0*  --  11.0*  --   HGB 10.8* 10.9* 10.5* 9.8*  HCT 33.4* 32.0* 33.7* 31.6*  MCV 93.0  --  95.7 95.2  PLT 249  --  [id number]    Basic Metabolic Panel: Recent Labs  Lab 08/23/19 1655 08/23/19 2359 08/24/19 1025 08/24/19 1237 08/24/19 1720 08/25/19 0343  NA 132* 129* 131*  --  132* 134*  K 5.4* 5.4* 6.8*  --  5.4* 4.9  CL 88*  --  89*  --  90* 95*  CO2 28  --  26  --  28 26  GLUCOSE 127*  --  105*  --  115* 111*  BUN 79*  --  89*  --  93* 77*  CREATININE 4.58*  --  5.05*  --  5.12* 4.01*  CALCIUM 9.5  --  9.3  --  8.9 8.5*  MG  --   --   --  2.0  --  2.1  PHOS  --   --   --  5.7*  --  4.8*   GFR: Estimated Creatinine Clearance: 22.9 mL/min (A) (by C-G formula based on SCr of 4.01 mg/dL (H)). Recent Labs  Lab 08/23/19 1655 08/23/19 1721 08/23/19 2005 08/24/19 1025 08/24/19 1041 08/25/19 0343  PROCALCITON  --   --   --  <0.10  --  <0.10  WBC 12.3*  --   --  12.4*  --  9.7  LATICACIDVEN  --  3.0* 3.1*  --  2.4* 1.4    Liver Function Tests: Recent Labs  Lab 08/23/19 1655 08/24/19 1025 08/25/19 0343  AST 22 29  --   ALT 14 14  --   ALKPHOS 55 50  --   BILITOT 1.0 1.2  --   PROT 6.4* 6.3*  --   ALBUMIN 3.0* 3.1* 3.0*   No results for input(s): LIPASE, AMYLASE in the last 168 hours. No results for input(s): AMMONIA in the last  168 hours.  ABG    Component Value Date/Time   PHART 7.511 (H) 08/23/2019 2359   PCO2ART 38.7 08/23/2019 2359   PO2ART 32.0 (LL) 08/23/2019 2359   HCO3 30.9 (H) 08/23/2019 2359   TCO2 32 08/23/2019 2359   ACIDBASEDEF 2.0 12/26/2015 1327   O2SAT 67.0 08/23/2019 2359     Coagulation Profile: Recent Labs  Lab 08/23/19 1845  INR 2.3*    Cardiac Enzymes: Recent Labs  Lab 08/24/19 1025   CKTOTAL 61    HbA1C: Hgb A1c MFr Bld  Date/Time Value Ref Range Status  05/25/2019 01:53 PM 7.2 (H) 4.6 - 6.5 % Final    Comment:    Glycemic Control Guidelines for People with Diabetes:Non Diabetic:  <6%Goal of Therapy: <7%Additional Action Suggested:  >8%   02/21/2019 03:16 PM 7.4 (H) 4.6 - 6.5 % Final    Comment:    Glycemic Control Guidelines for People with Diabetes:Non Diabetic:  <6%Goal of Therapy: <7%Additional Action Suggested:  >8%     CBG: Recent Labs  Lab 08/24/19 1548 08/24/19 1940 08/24/19 2330 08/25/19 0354 08/25/19 0805  GLUCAP 98 101* 97 99 98    Review of Systems:   Gen: Denies fever, chills, weight change, + fatigue, night sweats HEENT: Denies blurred vision, double vision, ++hearing loss, No tinnitus, sinus congestion, rhinorrhea, sore throat, neck stiffness, dysphagia PULM: +  shortness of breath, No cough, sputum production, hemoptysis, wheezing CV: Denies chest pain, edema, orthopnea, paroxysmal nocturnal dyspnea, palpitations GI: Denies abdominal pain, nausea, vomiting, diarrhea, hematochezia, melena, constipation, change in bowel habits GU: Denies dysuria, hematuria, polyuria, oliguria, urethral discharge Endocrine: Denies hot or cold intolerance, polyuria, polyphagia or appetite change Derm: Denies rash, dry skin, scaling or peeling skin change, + cellulitis L hip Heme: +  easy bruising, No bleeding, bleeding gums Neuro: Denies headache, numbness, weakness, slurred speech, loss of memory or consciousness  Past Medical History  He,  has a past medical history of Arthritis, B12 deficiency, CAD (coronary artery disease), Cancer ([place]), Cataracts, Cholelithiasis, Chronic diastolic CHF (congestive heart failure) ([place]), Claustrophobia, COPD (chronic obstructive pulmonary disease) ([place]), CVA (cerebral vascular accident) ([place]) (~ 2001), Diabetic neuropathy ([place]), Diverticulitis, Dysrhythmia, ED (erectile dysfunction), Essential hypertension, History of MRSA  infection, History of stomach ulcers, HOH (hard of hearing), Hypercholesterolemia, Legally blind, Lower extremity edema, Morbid obesity ([place]), Neuropathy, NSTEMI (non-ST elevated myocardial infarction) ([place]) (05/2017), OSA on CPAP, PAF (paroxysmal atrial fibrillation) ([place]), Pneumonia (2016), Tunnel vision, Type II diabetes mellitus ([place]), Venous stasis, and Weakness (01/11/2017).   Surgical History    Past Surgical History:  Procedure Laterality Date   CARDIAC CATHETERIZATION  1990's   CARDIAC CATHETERIZATION N/A 08/17/2016   Procedure: Left Heart Cath and Coronary Angiography;  Surgeon: Peter M Martinique, MD;  Location: [facility];  Service: Cardiovascular;  Laterality: N/A;   CARDIAC CATHETERIZATION N/A 08/19/2016   Procedure: Coronary Balloon Angioplasty;  Surgeon: Peter M Martinique, MD;  Location: [facility];  Service: Cardiovascular;  Laterality: N/A;   CATARACT EXTRACTION  10/2016   CEREBRAL ANGIOGRAM  ~ 2000   COLONOSCOPY     LEFT HEART CATH AND CORONARY ANGIOGRAPHY N/A 06/01/2017   Procedure: Left Heart Cath and Coronary Angiography;  Surgeon: Burnell Blanks, MD;  Location: [facility];  Service: Cardiovascular;  Laterality: N/A;   LEFT HEART CATHETERIZATION WITH CORONARY ANGIOGRAM N/A 10/27/2012   Procedure: LEFT HEART CATHETERIZATION WITH CORONARY ANGIOGRAM;  Surgeon: Burnell Blanks, MD;  Location: [facility];  Service: Cardiovascular;  Laterality: N/A;   TOE AMPUTATION  2006; 2009   "Dr. 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 His smoking use included cigarettes and cigars. He has a  30.00 pack-year smoking history. He quit smokeless tobacco use about 3 years ago. He reports current alcohol use. He reports that he does not use drugs.   Family History   His family history includes Breast cancer (age of onset: 44) in his mother; Heart disease (age of onset: 19) in his father. There is no history of Asthma.   Allergies Allergies  Allergen Reactions   Tolterodine Swelling and Other (See Comments)    (Detrol) Dry mouth/dry throat, severe swelling of the legs, and started a "downward spiral"   Lipitor [Atorvastatin] Other (See Comments)    Muscle aches   Xarelto [Rivaroxaban] Rash     Home Medications  Prior to Admission medications   Medication Sig Start Date End Date Taking? Authorizing Provider  acetaminophen (TYLENOL) 500 MG tablet Take 1,000 mg by mouth at bedtime.    Yes [provider]  albuterol (VENTOLIN HFA) 108 (90 Base) MCG/ACT inhaler Inhale 1-2 puffs into the lungs every 6 (six) hours as needed for wheezing or shortness of breath.   Yes [provider]  apixaban (ELIQUIS) 5 MG TABS tablet Take 1 tablet (5 mg total) by mouth 2 (two) times daily. 08/08/19  Yes Mercy Riding, MD  aspirin EC 81 MG tablet Take 81 mg by mouth daily.   Yes [provider]  Cholecalciferol (VITAMIN D-3) 25 MCG (1000 UT) CAPS Take 1,000 Units by mouth daily.   Yes [provider]  Fluticasone-Salmeterol (ADVAIR DISKUS) 100-50 MCG/DOSE AEPB Inhale 1 puff into the lungs 2 (two) times daily. 03/02/16  Yes Plotnikov, Evie Lacks, MD  gabapentin (NEURONTIN) 300 MG capsule TAKE 1 CAPSULE THREE TIMES DAILY Patient taking differently: Take 300 mg by mouth 2 (two) times daily.  08/04/18  Yes Plotnikov, Evie Lacks, MD  insulin aspart (NOVOLOG) 100 UNIT/ML FlexPen Inject 20 Units into the skin 4 (four) times daily - after meals and at bedtime. Follow SS Patient taking differently: Inject 1-5 Units into the skin See admin instructions. Inject 1-5 units into the skin  three to four times a day, per sliding scale 08/23/18  Yes Plotnikov, Evie Lacks, MD  isosorbide mononitrate (IMDUR) 60 MG 24 hr tablet TAKE 1 TABLET EVERY DAY Patient taking differently: Take 60 mg by mouth daily.  05/08/19  Yes Plotnikov, Evie Lacks, MD  magnesium oxide (MAG-OX) 400 (241.3 Mg) MG tablet Take 1 tablet (400 mg total) by mouth 2 (two) times daily. 08/09/19  Yes Mercy Riding, MD  metFORMIN (GLUCOPHAGE) 500 MG tablet TAKE 1 TABLET TWICE DAILY WITH A MEAL Patient taking differently: Take 500 mg by mouth 2 (two) times daily with a meal.  05/08/19  Yes Plotnikov, Evie Lacks, MD  metoprolol tartrate (LOPRESSOR) 25 MG tablet Take 1 tablet (25 mg total) by mouth 2 (two) times daily. 08/04/18  Yes Plotnikov, Evie Lacks, MD  mupirocin ointment (BACTROBAN) 2 % Apply 1 application topically See admin instructions. Apply to  affected areas of bilateral feet, ankles, and legs once daily    Yes [provider]  nitroGLYCERIN (NITROSTAT) 0.4 MG SL tablet DISSOLVE 1 TABLET (0.4 MG TOTAL) UNDER THE TONGUE EVERY 5 (FIVE) MINUTES AS NEEDED (UP TO 3 DOSES) FOR CHEST PAIN Patient taking differently: Place 0.4 mg under the tongue every 5 (five) minutes as needed for chest pain.  05/08/19  Yes Plotnikov, Evie Lacks, MD  pravastatin (PRAVACHOL) 40 MG tablet TAKE 1 TABLET EVERY DAY Patient taking differently: Take 40 mg by mouth at bedtime.  05/08/19  Yes Plotnikov, Evie Lacks, MD  PRESCRIPTION MEDICATION See admin instructions. CPAP- At bedtime   Yes [provider]  ranolazine (RANEXA) 500 MG 12 hr tablet TAKE 1 TABLET TWICE DAILY Patient taking differently: Take 500 mg by mouth 2 (two) times daily.  08/03/18  Yes Plotnikov, Evie Lacks, MD  torsemide (DEMADEX) 20 MG tablet Take 40 mg by mouth See admin instructions. Take 40 mg by mouth two times a day and additional 20 mg once daily until desired amount of output of urine is achieved   Yes [provider]  triamcinolone ointment (KENALOG) 0.5 %  Apply 1 application topically 2 (two) times daily. Patient taking differently: Apply 1 application topically 2 (two) times daily as needed (for itching).  02/21/19 02/21/20 Yes Plotnikov, Evie Lacks, MD  vitamin B-12 (CYANOCOBALAMIN) 1000 MCG tablet Take 1,000 mcg by mouth daily.    Yes [provider]  collagenase (SANTYL) ointment Apply 1 application topically daily. 11/29/16   Trula Slade, DPM  doxycycline (VIBRA-TABS) 100 MG tablet Take 1 tablet (100 mg total) by mouth every 12 (twelve) hours. Patient not taking: Reported on 08/23/2019 08/08/19   Mercy Riding, MD  torsemide (DEMADEX) 100 MG tablet Take 1 tablet (100 mg total) by mouth daily. 08/21/19   Plotnikov, Evie Lacks, MD     Critical care time: 39 mionutes     Dr. Lamonte Sakai and I spoke with patient's wife at length 9/11. This is a second marriage and the patient has children from a previous marriage. She has spoken with the patient's children and has made the patient a DNR as he has a very significant cardiac history. She is in agreement with short term CVVHD and insertion of HD cath.If the patient's kidneys do not rebound, she is not inclined to continue to pursue aggressive treatment measures. There will need to be ongoing goals of care discussions to align goals of care with plan of care.Magdalen Spatz, AGACNP-BC [place] Pager # [phone] After 4 pm please call [phone] 08/25/2019 8:44 AM

## 2019-08-25 NOTE — Progress Notes (Signed)
Asked patient about CPAP tonight.  Patient states that he uses one at home, but the mask he uses are nasal pillows.  I told him about both types of masks that we have here at this hospital, but patient stated he does not want anything strapped tight to his face at this time.  Patient is currently using a nasal cannula and it is taped to his face.  O2 flow is 4L and patient sat is 99%.  No distress noted, patient is receiving CRRT.  Will ask patient about using the CPAP again tomorrow.  No distress noted at this time.  Will continue to monitor.

## 2019-08-25 NOTE — Evaluation (Signed)
Clinical/Bedside Swallow Evaluation Patient Details  Name: Charles [name] MRN: [id number] Date of Birth: 1941-11-29  Today's Date: 08/25/2019 Time: SLP Start Time (ACUTE ONLY): 1125 SLP Stop Time (ACUTE ONLY): 1138 SLP Time Calculation (min) (ACUTE ONLY): 13 min  Past Medical History:  Past Medical History:  Diagnosis Date  . Arthritis   . B12 deficiency   . CAD (coronary artery disease)    a. Approx. 2000 - MI. Cath: single vsl dz, PTCA dLAD/medical rx;  b. NSTEMI 11/13 => IVUS attempted for RCA but not successful; anatomy felt stable from 2000 => med Rx. c. [place] 08/2016: severe stable diffuse dLAD, severe prox RCA -> PCI of RCA unsuccessful; d. 05/2017 NSTEMI/Cath: LM nl, LAD 20ost/p, 74m, 80d, LCX 30ost, 20p, 58m, OM2 90, RCA 80p, 99d.  . Cancer ([place])    skin cancer- head   . Cataracts   . Cholelithiasis   . Chronic diastolic CHF (congestive heart failure) ([place])    a. 05/2017 Echo: EF 60-65%, Gr2 DD, mildly dil Ao, triv MR, mildly to mod dil LA, mild PR.  . Claustrophobia   . COPD (chronic obstructive pulmonary disease) ([place])   . CVA (cerebral vascular accident) ([place]) ~ 2001   vision inparted from stroke.  Visual Memory loss  . Diabetic neuropathy ([place])   . Diverticulitis   . Dysrhythmia    if he does not take Metoprolol- Afib  . ED (erectile dysfunction)   . Essential hypertension   . History of MRSA infection    Right foot  . History of stomach ulcers   . HOH (hard of hearing)   . Hypercholesterolemia   . Legally blind   . Lower extremity edema   . Morbid obesity ([name])   . Neuropathy   . NSTEMI (non-ST elevated myocardial infarction) ([place]) 05/2017  . OSA on CPAP   . PAF (paroxysmal atrial fibrillation) ([name])    a. confirmed by event monitor. b. 02/2014 rash on Coumadin, patient decided to discontinue Xarelto due to possible rash, cost and lawyers ads on TV, agreed to take Plavix.  . Pneumonia 2016  . Tunnel vision    Since stroke  . Type II diabetes mellitus ([place])   .  Venous stasis   . Weakness 01/11/2017   Past Surgical History:  Past Surgical History:  Procedure Laterality Date  . CARDIAC CATHETERIZATION  1990's  . CARDIAC CATHETERIZATION N/A 08/17/2016   Procedure: Left Heart Cath and Coronary Angiography;  Surgeon: Peter M Martinique, MD;  Location: [facility];  Service: Cardiovascular;  Laterality: N/A;  . CARDIAC CATHETERIZATION N/A 08/19/2016   Procedure: Coronary Balloon Angioplasty;  Surgeon: Peter M Martinique, MD;  Location: [facility];  Service: Cardiovascular;  Laterality: N/A;  . CATARACT EXTRACTION  10/2016  . CEREBRAL ANGIOGRAM  ~ 2000  . COLONOSCOPY    . LEFT HEART CATH AND CORONARY ANGIOGRAPHY N/A 06/01/2017   Procedure: Left Heart Cath and Coronary Angiography;  Surgeon: Burnell Blanks, MD;  Location: [facility];  Service: Cardiovascular;  Laterality: N/A;  . LEFT HEART CATHETERIZATION WITH CORONARY ANGIOGRAM N/A 10/27/2012   Procedure: LEFT HEART CATHETERIZATION WITH CORONARY ANGIOGRAM;  Surgeon: Burnell Blanks, MD;  Location: [facility] CATH LAB;  Service: Cardiovascular;  Laterality: N/A;  . TOE AMPUTATION  2006; 2009   "Dr. Blenda Mounts; big toe left foot; little toe on right foot" (10/26/2012)  . TOE AMPUTATION Right 12/24/2015   2nd & 3 toes/notes 1/13//2017  . TOE AMPUTATION Right  5TH TOE  . 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  Of note he also had a recent admission for inadvertent overdose of Lasix.  Per notes, he has not been well since that admission and particularly since discharge from rehab back to home.  He has had gradually progressive dyspnea, decreased  functional capacity, lower extremity edema.  P.O. intake has been poor.  Dx shock with hypotension, AKI, pleural effusion vs infiltrate; undergoing CVVHD.    Assessment / Plan / Recommendation Clinical Impression  Pt presents with functional oropharyngeal swallow with adequate oral anticipation, recognition, and mastication, brisk swallow response, no s/s of aspiration.  Oral mechanism exam is unremarkable. There is adequate respiratory/swallowing synchrony.  Pt prefers soft foods; will recommend regular diet to allow more options.  No SLP f/u recommended. D/W RN.  SLP Visit Diagnosis: Dysphagia, unspecified (R13.10)    Aspiration Risk  No limitations    Diet Recommendation   regular solids, thin liquids  Medication Administration: Whole meds with liquid    Other  Recommendations Oral Care Recommendations: Oral care BID   Follow up Recommendations None      Frequency and Duration            Prognosis        Swallow Study   General HPI: 78 year old man with multiple medical problems including coronary artery disease, hypertension with chronic diastolic CHF, diabetes, COPD, obstructive sleep apnea, atrial fibrillation with prior stroke.  Of note he also had a recent admission for inadvertent overdose of Lasix.  His wife indicates that he has not been well since that admission and particularly since discharge from rehab back to home.  He has had gradually progressive dyspnea, decreased functional capacity, lower extremity edema.  P.O. intake has been poor.  Dx shock with hypotension, AKI, pleural effusion vs infiltrate; undergoing CVVHD.  Type of Study: Bedside Swallow Evaluation Previous Swallow Assessment: no Diet Prior to this Study: NPO Temperature Spikes Noted: No Respiratory Status: Nasal cannula History of Recent Intubation: No Behavior/Cognition: Alert;Cooperative Oral Cavity Assessment: Within Functional Limits Oral Care Completed by SLP: Recent completion by staff Oral  Cavity - Dentition: Adequate natural dentition Self-Feeding Abilities: Needs assist Patient Positioning: Upright in bed Baseline Vocal Quality: Normal Volitional Cough: Strong Volitional Swallow: Able to elicit    Oral/Motor/Sensory Function Overall Oral Motor/Sensory Function: Within functional limits   Ice Chips Ice chips: Within functional limits   Thin Liquid Thin Liquid: Within functional limits    Nectar Thick Nectar Thick Liquid: Not tested   Honey Thick Honey Thick Liquid: Not tested   Puree Puree: Within functional limits   Solid     Solid: Within functional limits      Juan Quam Laurice 08/25/2019,11:41 AM  Estill Bamberg L. Tivis Ringer, [place] Office number [phone] Pager [phone]

## 2019-08-26 DIAGNOSIS — R6520 Severe sepsis without septic shock: Secondary | ICD-10-CM

## 2019-08-26 DIAGNOSIS — A419 Sepsis, unspecified organism: Secondary | ICD-10-CM

## 2019-08-26 LAB — CBC
HCT: 29.8 % — ABNORMAL LOW (ref 39.0–52.0)
Hemoglobin: 9.4 g/dL — ABNORMAL LOW (ref 13.0–17.0)
MCH: 30 pg (ref 26.0–34.0)
MCHC: 31.5 g/dL (ref 30.0–36.0)
MCV: 95.2 fL (ref 80.0–100.0)
Platelets: 179 10*3/uL (ref 150–400)
RBC: 3.13 MIL/uL — ABNORMAL LOW (ref 4.22–5.81)
RDW: 16.4 % — ABNORMAL HIGH (ref 11.5–15.5)
WBC: 9.6 10*3/uL (ref 4.0–10.5)
nRBC: 0 % (ref 0.0–0.2)

## 2019-08-26 LAB — GLUCOSE, CAPILLARY
Glucose-Capillary: 105 mg/dL — ABNORMAL HIGH (ref 70–99)
Glucose-Capillary: 120 mg/dL — ABNORMAL HIGH (ref 70–99)
Glucose-Capillary: 121 mg/dL — ABNORMAL HIGH (ref 70–99)
Glucose-Capillary: 134 mg/dL — ABNORMAL HIGH (ref 70–99)
Glucose-Capillary: 148 mg/dL — ABNORMAL HIGH (ref 70–99)
Glucose-Capillary: 98 mg/dL (ref 70–99)

## 2019-08-26 LAB — RENAL FUNCTION PANEL
Albumin: 2.9 g/dL — ABNORMAL LOW (ref 3.5–5.0)
Anion gap: 10 (ref 5–15)
BUN: 40 mg/dL — ABNORMAL HIGH (ref 8–23)
CO2: 26 mmol/L (ref 22–32)
Calcium: 8.3 mg/dL — ABNORMAL LOW (ref 8.9–10.3)
Chloride: 98 mmol/L (ref 98–111)
Creatinine, Ser: 2.17 mg/dL — ABNORMAL HIGH (ref 0.61–1.24)
GFR calc Af Amer: 33 mL/min — ABNORMAL LOW (ref >60)
GFR calc non Af Amer: 28 mL/min — ABNORMAL LOW (ref >60)
Glucose, Bld: 104 mg/dL — ABNORMAL HIGH (ref 70–99)
Phosphorus: 2.6 mg/dL (ref 2.5–4.6)
Potassium: 4.3 mmol/L (ref 3.5–5.1)
Sodium: 134 mmol/L — ABNORMAL LOW (ref 135–145)

## 2019-08-26 LAB — URINE CULTURE: Culture: 50000 — AB

## 2019-08-26 LAB — PROCALCITONIN: Procalcitonin: <0.1 ng/mL

## 2019-08-26 LAB — MAGNESIUM: Magnesium: 2.2 mg/dL (ref 1.7–2.4)

## 2019-08-26 LAB — APTT: aPTT: 38 seconds — ABNORMAL HIGH (ref 24–36)

## 2019-08-26 LAB — HEPARIN LEVEL (UNFRACTIONATED): Heparin Unfractionated: >2.2 IU/mL — ABNORMAL HIGH (ref 0.30–0.70)

## 2019-08-26 LAB — LEGIONELLA PNEUMOPHILA SEROGP 1 UR AG: L. pneumophila Serogp 1 Ur Ag: NEGATIVE

## 2019-08-26 MED ORDER — NEPRO/CARBSTEADY PO LIQD
237.0000 mL | Freq: Three times a day (TID) | ORAL | Status: DC
  Administered 2019-08-26 – 2019-09-05 (×15): 237 mL via ORAL
  Filled 2019-08-26 (×2): qty 237

## 2019-08-26 MED ORDER — GLUCERNA SHAKE PO LIQD
237.0000 mL | Freq: Three times a day (TID) | ORAL | Status: DC
  Administered 2019-08-26: 237 mL via ORAL

## 2019-08-26 MED ORDER — CHLORHEXIDINE GLUCONATE CLOTH 2 % EX PADS
6.0000 | MEDICATED_PAD | Freq: Every day | CUTANEOUS | Status: DC
  Administered 2019-08-27 – 2019-09-06 (×10): 6 via TOPICAL

## 2019-08-26 MED ORDER — MUPIROCIN 2 % EX OINT
1.0000 "application " | TOPICAL_OINTMENT | Freq: Two times a day (BID) | CUTANEOUS | Status: AC
  Administered 2019-08-26 – 2019-08-30 (×10): 1 via NASAL
  Filled 2019-08-26 (×4): qty 22

## 2019-08-26 MED ORDER — HEPARIN SODIUM (PORCINE) 1000 UNIT/ML DIALYSIS
1000.0000 [IU] | INTRAMUSCULAR | Status: DC | PRN
  Administered 2019-08-26: 2400 [IU] via INTRAVENOUS_CENTRAL
  Filled 2019-08-26 (×2): qty 6

## 2019-08-26 NOTE — Procedures (Signed)
I evaluated the patient while on CRRT and discussed with bedside RN.  Dialysis is running without issue.  Will plan to DC.

## 2019-08-26 NOTE — Progress Notes (Signed)
NAME:  Charles [name], MRN:  [id number], DOB:  26-Dec-1940, LOS: 3 ADMISSION DATE:  08/23/2019, CONSULTATION DATE:  08/24/2019 REFERRING MD:  [place], CHIEF COMPLAINT:  Hypotension/ Shock  Brief History   78 y.o. male with history of CAD managed medically, chronic diastolic CHF, diabetes mellitus, sleep apnea, COPD, history of CVA, atrial fibrillation who was recently admitted and discharged 3 weeks ago after patient inadvertently took overdose of Lasix.    Patient was observed in the hospital and eventually discharged to rehab on torsemide.  At the time patient also was started on apixaban for A. fib.   And eventually was discharged back to home.  Patient wife states that patient has been doing poorly since discharge from the hospital and has gradually experienced worsening  shortness of breath with increasing lower extremity edema.  Two days prior to admission patient had become very short of breath with minimal exertion.  Has been eating poorly.  Has not had any nausea vomiting or diarrhea. Has not had any fever chills.  Did not lose consciousness.  Pt. Was Admitted for acute hypoxic respiratory failure with anasarca,  acute renal failure, and heart failure.  Pt was found to have an infiltrate on CXR and has had very soft BP since admission. Overnight 9/11  BP dropped to 65/40 ( Checked both arms), pt was bolused with 500 cc's IVF and started on maintenance fluids at 100 cc / hr.  As BP will need pressor support , and it appears patient will need higher level of care, PCCM were consulted to admit to ICU and manage care    Past Medical History   has a past medical history of Arthritis, B12 deficiency, CAD (coronary artery disease), Cancer ([place]), Cataracts, Cholelithiasis, Chronic diastolic CHF (congestive heart failure) ([place]), Claustrophobia, COPD (chronic obstructive pulmonary disease) ([place]), CVA (cerebral vascular accident) ([place]) (~ 2001), Diabetic neuropathy ([place]), Diverticulitis, Dysrhythmia, ED  (erectile dysfunction), Essential hypertension, History of MRSA infection, History of stomach ulcers, HOH (hard of hearing), Hypercholesterolemia, Legally blind, Lower extremity edema, Morbid obesity ([place]), Neuropathy, NSTEMI (non-ST elevated myocardial infarction) ([place]) (05/2017), OSA on CPAP, PAF (paroxysmal atrial fibrillation) ([place]), Pneumonia (2016), Tunnel vision, Type II diabetes mellitus ([place]), Venous stasis, and Weakness (01/11/2017).   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   Significant Hospital Events   Admission 9/10 9/11 - HD cath R IJ 9/12 - Pt. States he has no appetite. He denies pain. Afebrile  Hypotensive after 500 cc bolus and maintenance IVF of 100 cc per hour.Peripheral Levophed initiated  Consults:  9/11 PCCM  Procedures:    Significant Diagnostic Tests:   9/11 Echo>> Pending  05/2017>> Echo EF 0000000 II diastolic dysfunction, right ventricular systolic pressure was increased consistent with mild pulmonary hypertension.( PA Peak Pressures 40 mm Hg  Micro Data:  9/10 Urine Culture>> 9/10 Blood Culture>> 9/10  SARS Coronavirus 2 NEGATIVE  9/10 - Urine MRSA - positive 50K 9/11 - MRSA PCR - positive  Antimicrobials:  Doxycycline x 1 dose> 08/23/2019 Rocephin>>9/10>>  Interim history/subjective:    9/13 - Now DNr but full medical care.Off levophed. Not on vent. . On CRRT,  RN concerned about obstructed foley - [place] scan:  Only 64mL  in though not everything that is flushed in comes out.  . Has MRSA in  urine. Passsed swallow yesterday - ok for regular diet. New echo -ef 45%. Anticoag on hold due to hematuria  Objective   Blood pressure 98/70, pulse (!) 102, temperature (!) 97.4 F (36.3 C), temperature source Axillary, resp. rate (!) 21, height 6\' 4"  (1.93 m), weight 129.5 kg, SpO2 100 %.        Intake/Output Summary (Last 24 hours) at 08/26/2019 1109 Last data filed at 08/26/2019 1100 Gross per 24 hour  Intake 816.76 ml  Output 3252 ml  Net -2435.24 ml   Filed Weights   08/24/19 1500 08/25/19 0407 08/26/19 0207  Weight: 126.8 kg 132 kg 129.5 kg   General Appearance:  Looks deconditioned.  Has BAIR hugger on Head:  Normocephalic, without obvious abnormality, atraumatic Eyes:  PERRL - yes, conjunctiva/corneas - muddy     Ears:  Normal external ear canals, both ears Nose:  G tube - no bu has [place] Throat:  ETT TUBE - no , OG tube - no. POOR DENTITION + Neck:  Supple,  No enlargement/tenderness/nodules Lungs: Clear to auscultation bilaterally,  Heart:  S1 and S2 normal, no murmur, CVP - x.  Pressors - no Abdomen:  Soft, no masses, no organomegaly Genitalia / Rectal:  Not done Extremities:  Extremities- intact with significant pedal edema Skin:  ntact in exposed areas . Sacral area - not examined Neurologic:  Sedation - none -> RASS - +1 . Moves all 4s - yes. CAM-ICU - neg . Orientation - yes      Resolved Hospital Problem list     Assessment & Plan:  Chroic Hypotension - Baseline BP is in the 90's per wife CHF>> BNP of 1800 CAD with medical management PFO  LV Clot, apical aneurism.  Atrial fibrillation Hyperlipidemia   - 08/26/2019 - off pressors. EF 45% (basel;ine in 2018 was 65%)  Plan Goals SBP > 95 Card consult dependong on course for drop in EF.    Acute Kidney Injury - Normal creatinine prior to accidental overdose of Lasix     - 08/26/2019 - CRRT per rena. Minimal Ur OP. Concern for blocked foley. Nrmal CK 9/11   Plan Appreciate Renal Consult; CVVHD per  renal DC foley Bladder scan daily to look at ur op Trend BMET Avoid nephrotoxic medications Maintain renal perfusion Monitor UO   COPD hx OSA hx  - CXR this admit with chronic rt pleura effusion Afebrile WBC 12.4  - downtrending  Plan Oxygen to maintain sats of 88-92% Volume removal per CVVHD CPAP at bedtime per home protocol Consider CT chest per ptocol  Follow micro  Cellulitis per L hip abdomen MRSA PCR + and MRSA in urine culture + PCT normal   Plan vanc to continue Cefepime to continue -r eassess need 08/27/2019   Anemia of chronic illness  - 9/13 - hhgb 9/4  Plan Trend CBC Monitor for bleeding  - PRBC for hgb </= 6.9gm%    - exceptions are   -  if ACS susepcted/confirmed then transfuse for hgb </= 8.0gm%,  or    -  active bleeding with hemodynamic instability, then transfuse regardless of hemoglobin value   At at all times try to transfuse 1 unit prbc as possible with exception of active hemorrhage  Hematuria  - 9/13 - anticoagulation obn hold  Plan  - monitor   DM Plan CBG SSI  Hx CVA/ AMS Plan  Neuro checks per unit  protocol  Best practice:  Diet: diet as able.  Bedside swallow eval  08/25/2019 - passed  Pain/Anxiety/Delirium protocol (if indicated): NA VAP protocol (if indicated): NA DVT prophylaxis: Heparin gtt on hold GI prophylaxis: None Glucose control: CBG/ SSI Mobility: BR Code Status: DNR\ Family Communication: Dr. Lamonte Sakai  spoke with patient's wife at length 9/11. This is a second marriage and the patient has children from a previous marriage. She has spoken with the patient's children and has made the patient a DNR as he has a very significant cardiac history. She is in agreement with short term CVVHD and insertion of HD cath.If the patient's kidneys do not rebound, she is not inclined to continue to pursue aggressive treatment measures. There will need to be ongoing goals of care discussions to align goals of care with plan of  care..   Disposition: ICU      ATTESTATION & SIGNATURE   The patient GARLAN ALBARES is critically ill with multiple organ systems failure and requires high complexity decision making for assessment and support, frequent evaluation and titration of therapies, application of advanced monitoring technologies and extensive interpretation of multiple databases.   Critical Care Time devoted to patient care services described in this note is  30  Minutes. This time reflects time of care of this signee Dr Brand Males. This critical care time does not reflect procedure time, or teaching time or supervisory time of PA/NP/Med student/Med Resident etc but could involve care discussion time     Dr. Brand Males, M.D., [facility].C.P Pulmonary and Critical Care Medicine Staff Physician [facility] Pager: [phone], If no answer or between  15:00h - 7:00h: call [phone]  08/26/2019 12:09 PM     LABS    PULMONARY Recent Labs  Lab 08/23/19 2359  PHART 7.511*  PCO2ART 38.7  PO2ART 32.0*  HCO3 30.9*  TCO2 32  O2SAT 67.0    CBC Recent Labs  Lab 08/24/19 1025 08/25/19 0343 08/26/19 0413  HGB 10.5* 9.8* 9.4*  HCT 33.7* 31.6* 29.8*  WBC 12.4* 9.7 9.6  PLT 237 211 179    COAGULATION Recent Labs  Lab 08/23/19 1845  INR 2.3*    CARDIAC  No results for input(s): TROPONINI in the last 168 hours. No results for input(s): PROBNP in the last 168 hours.   CHEMISTRY Recent Labs  Lab 08/24/19 1025 08/24/19 1237 08/24/19 1720 08/25/19 0343 08/25/19 1730 08/26/19 0413  NA 131*  --  132* 134* 134* 134*  K 6.8*  --  5.4* 4.9 4.4 4.3  CL 89*  --  90* 95* 97* 98  CO2 26  --  28 26 27 26   GLUCOSE 105*  --  115* 111* 112* 104*  BUN 89*  --  93* 77* 49* 40*  CREATININE 5.05*  --  5.12* 4.01* 2.67* 2.17*  CALCIUM 9.3  --  8.9 8.5* 8.2* 8.3*  MG  --  2.0  --  2.1  --  2.2  PHOS  --  5.7*  --  4.8* 3.4 2.6   Estimated  Creatinine Clearance: 41.9 mL/min (A) (by C-G formula based on SCr of 2.17 mg/dL (H)).   LIVER Recent Labs  Lab 08/23/19 1655 08/23/19 1845 08/24/19 1025 08/25/19 0343 08/25/19 1730 08/26/19 0413  AST 22  --  29  --   --   --   ALT 14  --  14  --   --   --  ALKPHOS 55  --  50  --   --   --   BILITOT 1.0  --  1.2  --   --   --   PROT 6.4*  --  6.3*  --   --   --   ALBUMIN 3.0*  --  3.1* 3.0* 2.9* 2.9*  INR  --  2.3*  --   --   --   --      INFECTIOUS Recent Labs  Lab 08/23/19 2005 08/24/19 1025 08/24/19 1041 08/25/19 0343 08/26/19 0413  LATICACIDVEN 3.1*  --  2.4* 1.4  --   PROCALCITON  --  <0.10  --  <0.10 <0.10     ENDOCRINE CBG (last 3)  Recent Labs    08/26/19 0332 08/26/19 0802 08/26/19 1159  GLUCAP 98 105* 121*         IMAGING x48h  - image(s) personally visualized  -   highlighted in bold Dg Chest 1 View  Result Date: 08/24/2019 CLINICAL DATA:  78 year old male with central line placement. EXAM: CHEST  1 VIEW COMPARISON:  Chest radiograph dated 06/02 FINDINGS: Right IJ central venous line with tip close to the cavoatrial junction. No pneumothorax. There is a right pleural effusion with right lung base atelectasis. Infiltrate is not excluded. Clinical correlation is recommended. The left lung is clear. Stable cardiomegaly. Atherosclerotic calcification of the aortic arch. No acute osseous pathology. Osteopenia with degenerative changes of the spine. Old left humeral neck fracture. IMPRESSION: 1. Right IJ central venous line with tip close to the cavoatrial junction. No pneumothorax. 2. Right pleural effusion with right lung base atelectasis versus infiltrate. Electronically Signed   By: Anner Crete M.D.   On: 08/24/2019 18:32   Dg Chest Port 1 View  Result Date: 08/25/2019 CLINICAL DATA:  CHF EXAM: PORTABLE CHEST 1 VIEW COMPARISON:  Yesterday FINDINGS: Shortening of the right IJ catheter, tip near the SVC brachiocephalic confluence. Cardiomegaly  with haziness of the right chest from pleural effusion. No superimposed Kerley lines or air bronchogram. IMPRESSION: 1. Shorter right IJ catheter, tip near the SVC brachiocephalic confluence. 2. Cardiomegaly and right pleural effusion that is unchanged. Electronically Signed   By: Monte Fantasia M.D.   On: 08/25/2019 07:54

## 2019-08-26 NOTE — Progress Notes (Signed)
[facility]    NEPHROLOGY PROGRESS NOTE  SUBJECTIVE: Patient seen and examined. Denies shortness of breath.  Denies fevers, chills, chest pain, or other symptoms.  Hematuria noted.  CRRT running without difficulty.  Discussed with bedside RN.   OBJECTIVE:  Vitals:   08/26/19 1030 08/26/19 1203  BP: 98/70   Pulse: (!) 102   Resp: (!) 21   Temp:  98.5 F (36.9 C)  SpO2: 100%     Intake/Output Summary (Last 24 hours) at 08/26/2019 1218 Last data filed at 08/26/2019 1200 Gross per 24 hour  Intake 846.76 ml  Output 3337 ml  Net -2490.24 ml      General:  Lethargic, NAD HEENT: MMM [place] AT anicteric sclera Neck:  positive JVD, no adenopathy, right IJ temporary dialysis catheter CV:  Heart RRR  Lungs:  Decreased BS bilaterally Abd:  abd SNT/ND with normal BS, obese GU:  Bladder non-palpable Extremities:  (+)2 LE edema. Skin:  No skin rash  MEDICATIONS:  . Chlorhexidine Gluconate Cloth  6 each Topical Daily  . feeding supplement (NEPRO CARB STEADY)  237 mL Oral TID BM  . insulin aspart  0-9 Units Subcutaneous Q4H  . mometasone-formoterol  2 puff Inhalation BID  . mupirocin ointment  1 application Nasal BID       LABS:   CBC Latest Ref Rng & Units 08/26/2019 08/25/2019 08/24/2019  WBC 4.0 - 10.5 K/uL 9.6 9.7 12.4(H)  Hemoglobin 13.0 - 17.0 g/dL 9.4(L) 9.8(L) 10.5(L)  Hematocrit 39.0 - 52.0 % 29.8(L) 31.6(L) 33.7(L)  Platelets 150 - 400 K/uL 179 211 237    CMP Latest Ref Rng & Units 08/26/2019 08/25/2019 08/25/2019  Glucose 70 - 99 mg/dL 104(H) 112(H) 111(H)  BUN 8 - 23 mg/dL 40(H) 49(H) 77(H)  Creatinine 0.61 - 1.24 mg/dL 2.17(H) 2.67(H) 4.01(H)  Sodium 135 - 145 mmol/L 134(L) 134(L) 134(L)  Potassium 3.5 - 5.1 mmol/L 4.3 4.4 4.9  Chloride 98 - 111 mmol/L 98 97(L) 95(L)  CO2 22 - 32 mmol/L 26 27 26   Calcium 8.9 - 10.3 mg/dL 8.3(L) 8.2(L) 8.5(L)  Total Protein 6.5 - 8.1 g/dL - - -  Total Bilirubin 0.3 - 1.2 mg/dL - - -  Alkaline Phos 38 - 126 U/L - - -   AST 15 - 41 U/L - - -  ALT 0 - 44 U/L - - -    Lab Results  Component Value Date   CALCIUM 8.3 (L) 08/26/2019   CAION 1.12 (L) 08/23/2019   PHOS 2.6 08/26/2019       Component Value Date/Time   COLORURINE AMBER (A) 08/23/2019 2231   APPEARANCEUR HAZY (A) 08/23/2019 2231   LABSPEC 1.017 08/23/2019 2231   PHURINE 5.0 08/23/2019 2231   GLUCOSEU NEGATIVE 08/23/2019 2231   GLUCOSEU NEGATIVE 05/25/2019 1353   HGBUR MODERATE (A) 08/23/2019 2231   BILIRUBINUR NEGATIVE 08/23/2019 2231   KETONESUR NEGATIVE 08/23/2019 2231   PROTEINUR 100 (A) 08/23/2019 2231   UROBILINOGEN 1.0 05/25/2019 1353   NITRITE NEGATIVE 08/23/2019 2231   LEUKOCYTESUR SMALL (A) 08/23/2019 2231      Component Value Date/Time   PHART 7.511 (H) 08/23/2019 2359   PCO2ART 38.7 08/23/2019 2359   PO2ART 32.0 (LL) 08/23/2019 2359   HCO3 30.9 (H) 08/23/2019 2359   TCO2 32 08/23/2019 2359   ACIDBASEDEF 2.0 12/26/2015 1327   O2SAT 67.0 08/23/2019 2359       Component Value Date/Time   IRON 47 09/12/2014 1136   IRONPCTSAT 15.1 (L) 09/12/2014 1136  ASSESSMENT/PLAN:      Acute kidney injury.  Urine appears to have blood protein and WBCs.  Urine culture sent.  [place] without obstruction but nodular prostate noted.  Patient appears to be hypotensive with anasarca.  Continue foley.  Avoid nephrotoxins.  Avoid ACE inhibitors ARB's.  Avoid contrast.  Avoid intra-arterial procedures.    On CRRT. Will DC CRRT and move to IHD if needed.  Hypotension.  Improved, now off of pressors.  Echocardiogram with moderate, fixed thrombus on apical wall of the left ventricle, ejection fraction of 45 to 50%, and RV volume overload.  Respiratory failure now on BiPAP.    Improving oxygenation.  Suspect component of volume overload.  Atrial fibrillation continues on anticoagulation.  Anemia does not appear to be an issue at this particular time.  Continue to monitor  Diabetes mellitus per primary team  Coronary artery disease per  primary team  MRSA UTI.    Hyperkalemia.  Improved.    [place], DO, [place]

## 2019-08-27 ENCOUNTER — Encounter (HOSPITAL_COMMUNITY): Payer: Self-pay | Admitting: Urology

## 2019-08-27 ENCOUNTER — Inpatient Hospital Stay (HOSPITAL_COMMUNITY): Payer: Medicare Other

## 2019-08-27 DIAGNOSIS — N3001 Acute cystitis with hematuria: Secondary | ICD-10-CM

## 2019-08-27 LAB — CBC
HCT: 29.4 % — ABNORMAL LOW (ref 39.0–52.0)
Hemoglobin: 9.1 g/dL — ABNORMAL LOW (ref 13.0–17.0)
MCH: 29.9 pg (ref 26.0–34.0)
MCHC: 31 g/dL (ref 30.0–36.0)
MCV: 96.7 fL (ref 80.0–100.0)
Platelets: 156 10*3/uL (ref 150–400)
RBC: 3.04 MIL/uL — ABNORMAL LOW (ref 4.22–5.81)
RDW: 16.3 % — ABNORMAL HIGH (ref 11.5–15.5)
WBC: 8.7 10*3/uL (ref 4.0–10.5)
nRBC: 0 % (ref 0.0–0.2)

## 2019-08-27 LAB — BASIC METABOLIC PANEL
Anion gap: 6 (ref 5–15)
BUN: 42 mg/dL — ABNORMAL HIGH (ref 8–23)
CO2: 27 mmol/L (ref 22–32)
Calcium: 8.7 mg/dL — ABNORMAL LOW (ref 8.9–10.3)
Chloride: 101 mmol/L (ref 98–111)
Creatinine, Ser: 2.22 mg/dL — ABNORMAL HIGH (ref 0.61–1.24)
GFR calc Af Amer: 32 mL/min — ABNORMAL LOW (ref >60)
GFR calc non Af Amer: 27 mL/min — ABNORMAL LOW (ref >60)
Glucose, Bld: 123 mg/dL — ABNORMAL HIGH (ref 70–99)
Potassium: 4.3 mmol/L (ref 3.5–5.1)
Sodium: 134 mmol/L — ABNORMAL LOW (ref 135–145)

## 2019-08-27 LAB — APTT: aPTT: 39 seconds — ABNORMAL HIGH (ref 24–36)

## 2019-08-27 LAB — GLUCOSE, CAPILLARY
Glucose-Capillary: 116 mg/dL — ABNORMAL HIGH (ref 70–99)
Glucose-Capillary: 121 mg/dL — ABNORMAL HIGH (ref 70–99)
Glucose-Capillary: 144 mg/dL — ABNORMAL HIGH (ref 70–99)
Glucose-Capillary: 182 mg/dL — ABNORMAL HIGH (ref 70–99)
Glucose-Capillary: 148 mg/dL — ABNORMAL HIGH (ref 70–99)

## 2019-08-27 LAB — MAGNESIUM: Magnesium: 2.2 mg/dL (ref 1.7–2.4)

## 2019-08-27 LAB — VANCOMYCIN, RANDOM: Vancomycin Rm: 10

## 2019-08-27 LAB — HEPARIN LEVEL (UNFRACTIONATED): Heparin Unfractionated: >2.2 IU/mL — ABNORMAL HIGH (ref 0.30–0.70)

## 2019-08-27 MED ORDER — VANCOMYCIN VARIABLE DOSE PER UNSTABLE RENAL FUNCTION (PHARMACIST DOSING): Status: DC

## 2019-08-27 MED ORDER — SODIUM CHLORIDE 0.9 % IV SOLN
1.0000 g | INTRAVENOUS | Status: DC
  Filled 2019-08-27: qty 1

## 2019-08-27 MED ORDER — ACETAMINOPHEN 325 MG PO TABS
650.0000 mg | ORAL_TABLET | ORAL | Status: DC | PRN
  Administered 2019-08-27: 650 mg via ORAL
  Filled 2019-08-27: qty 2

## 2019-08-27 MED ORDER — VANCOMYCIN HCL 10 G IV SOLR
1250.0000 mg | Freq: Once | INTRAVENOUS | Status: AC
  Administered 2019-08-27: 1250 mg via INTRAVENOUS
  Filled 2019-08-27: qty 1250

## 2019-08-27 MED ORDER — LIDOCAINE HCL URETHRAL/MUCOSAL 2 % EX GEL
1.0000 "application " | CUTANEOUS | Status: AC
  Filled 2019-08-27: qty 20

## 2019-08-27 MED ORDER — FINASTERIDE 5 MG PO TABS
5.0000 mg | ORAL_TABLET | Freq: Every day | ORAL | Status: DC
  Administered 2019-08-27 – 2019-09-06 (×11): 5 mg via ORAL
  Filled 2019-08-27 (×12): qty 1

## 2019-08-27 MED ORDER — OXYCODONE HCL 5 MG PO TABS
5.0000 mg | ORAL_TABLET | Freq: Four times a day (QID) | ORAL | Status: DC | PRN
  Administered 2019-08-30 – 2019-09-04 (×6): 5 mg via ORAL
  Filled 2019-08-27 (×6): qty 1

## 2019-08-27 NOTE — Progress Notes (Signed)
Old foley removed, new coude catheter inserted per order. Bloody urine return. Nurse staff notified. Audie Clear, LPN

## 2019-08-27 NOTE — Progress Notes (Addendum)
Pharmacy Antibiotic Note  Charles [name] is a 78 y.o. male admitted on 08/23/2019 with L-hip cellulitis and concern for UTI. Pharmacy has been consulted for Vancomycin and Cefepime dosing.  The patient was noted to have AKI with SCr 5.05 on admission - s/p CRRT 9/11 >> 9/14, SCr 2.22 this AM off CRRT, UOP 0.1 ml/kg/hr, CrCl hard to estimate - will monitor SCr and UOP trends since stopping CRRT. Will adjust Cefepime and check a random Vanc level this afternoon and re-dose if/when <15 mcg/ml.  Plan: - D/c Cefepime - Hold Vancomycin - obtain a 24h Vancomycin random - Will plan to re-dose if/when VR<15 mcg/ml - Will continue to follow renal function, culture results, LOT, and antibiotic de-escalation plans    Height: 6\' 4"  (193 cm) Weight: 282 lb 10.1 oz (128.2 kg) IBW/kg (Calculated) : 86.8  Temp (24hrs), Avg:98.4 F (36.9 C), Min:97.4 F (36.3 C), Max:99.2 F (37.3 C)  Recent Labs  Lab 08/23/19 1655 08/23/19 1721 08/23/19 2005 08/24/19 1025 08/24/19 1041 08/24/19 1720 08/25/19 0343 08/25/19 1730 08/26/19 0413 08/27/19 0452  WBC 12.3*  --   --  12.4*  --   --  9.7  --  9.6 8.7  CREATININE 4.58*  --   --  5.05*  --  5.12* 4.01* 2.67* 2.17* 2.22*  LATICACIDVEN  --  3.0* 3.1*  --  2.4*  --  1.4  --   --   --     Estimated Creatinine Clearance: 40.1 mL/min (A) (by C-G formula based on SCr of 2.22 mg/dL (H)).    Allergies  Allergen Reactions  . Tolterodine Swelling and Other (See Comments)    (Detrol) Dry mouth/dry throat, severe swelling of the legs, and started a "downward spiral"  . Lipitor [Atorvastatin] Other (See Comments)    Muscle aches  . Xarelto [Rivaroxaban] Rash    CTX 9/10 x 1 Doxy 9/10 x 1 Vanc 9/11 >> Cefepime 9/11 >>  9/10 COVID >> neg 9/10 BCx >> ngx3d 9/10 UCx >> 50k MRSA 9/11 UCx >> NG  Thank you for allowing pharmacy to be a part of this patient's care.  Alycia Rossetti, PharmD, BCPS Clinical Pharmacist Clinical phone for 08/27/2019:  [phone] 08/27/2019 7:30 AM   **Pharmacist phone directory can now be found on [email or web address] ([email or web address]).  Listed under [place].  9/14 1800 PM: Vancomycin random level this PM is 10 Vancomycin 1250 mg iv x 1 dose now Follow up AM Scr for trend.  Thank you Anette Guarneri, PharmD

## 2019-08-27 NOTE — Progress Notes (Signed)
NAME:  Charles [name], MRN:  [id number], DOB:  05/03/1941, LOS: 4 ADMISSION DATE:  08/23/2019, CONSULTATION DATE:  08/24/2019 REFERRING MD:  [place], CHIEF COMPLAINT:  Hypotension/ Shock  Brief History   78 y.o. male with history of CAD managed medically, chronic diastolic CHF, diabetes mellitus, sleep apnea, COPD, history of CVA, atrial fibrillation who was recently admitted and discharged 3 weeks ago after patient inadvertently took overdose of Lasix.    Patient was observed in the hospital and eventually discharged to rehab on torsemide.  At the time patient also was started on apixaban for A. fib.   And eventually was discharged back to home.  Patient wife states that patient has been doing poorly since discharge from the hospital and has gradually experienced worsening  shortness of breath with increasing lower extremity edema.  Two days prior to admission patient had become very short of breath with minimal exertion.  Has been eating poorly.  Has not had any nausea vomiting or diarrhea. Has not had any fever chills.  Did not lose consciousness.  Pt. Was Admitted for acute hypoxic respiratory failure with anasarca,  acute renal failure, and heart failure.  Pt was found to have an infiltrate on CXR and has had very soft BP since admission. Overnight 9/11  BP dropped to 65/40 ( Checked both arms), pt was bolused with 500 cc's IVF and started on maintenance fluids at 100 cc / hr.  As BP will need pressor support, and it appears patient will need higher level of care, PCCM were consulted to admit to ICU and manage care. Patient has had continued hematuria and there has been concern for foley blockage with decreased UO. On 9/14, urology was consulted and the decision was made to place a Coude catheter to circumnavigate the patient's BPH and assist with clot passage.    Past Medical History   has a past medical history of Arthritis, B12 deficiency, CAD (coronary artery disease), Cancer ([place]), Cataracts,  Cholelithiasis, Chronic diastolic CHF (congestive heart failure) ([place]), Claustrophobia, COPD (chronic obstructive pulmonary disease) ([place]), CVA (cerebral vascular accident) ([place]) (~ 2001), Diabetic neuropathy ([place]), Diverticulitis, Dysrhythmia, ED (erectile dysfunction), Essential hypertension, History of MRSA infection, History of stomach ulcers, HOH (hard of hearing), Hypercholesterolemia, Legally blind, Lower extremity edema, Morbid obesity ([place]), Neuropathy, NSTEMI (non-ST elevated myocardial infarction) ([place]) (05/2017), OSA on CPAP, PAF (paroxysmal atrial fibrillation) ([place]), Pneumonia (2016), Tunnel vision, Type II diabetes mellitus ([place]), Venous stasis, and Weakness (01/11/2017).   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   Significant Hospital Events   Admission 9/10 9/11 - HD cath R IJ 9/12 - Pt. States he has no appetite. He denies pain. 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 He feels that his shortness of breath has improved significantly since admission. Denies chest pain, fever, chills, nausea, or vomiting. Urology consulted for hematuria and the decision was made to place a Coude catheter to assist with clot passage, bleeding thought to be secondary to BPH and iatrogenic catheter placement.  Objective   Blood pressure (!) 100/52, pulse (!) 101, temperature 99.3 F (37.4 C), temperature source Oral, resp. rate (!) 24, height 6\' 4"  (1.93 m), weight 128.2 kg, SpO2 99 %.        Intake/Output Summary (Last 24 hours) at 08/27/2019 1048 Last data filed at 08/27/2019 1000 Gross per 24 hour  Intake 699.87 ml  Output 725 ml  Net -25.13 ml   Filed Weights   08/25/19 0407 08/26/19 0207 08/27/19 0457  Weight: 132 kg 129.5 kg 128.2 kg   General Appearance:  Elderly, deconditioned man laying in bed in no acute distress HEENT:  Normocephalic, atraumatic. EOMI. Anicteric sclerae. [place] in place. Poor dentition. Neck:  Supple,  JVD noted to clavicle at 45 degrees Lungs: Clear to auscultation bilaterally with normal work of breathing and symmetric chest wall expansion on 2L [place] Heart: Irregular rhythm and increased rate. Heart sounds distant, no murmurs or gallops appreciated. 1+ radial pulse Abdomen:  Normoactive bowel sounds. Soft, non-distended, non-tender. GU: Foley catheter in place and dark hematuria noted with ~150 cc in bag Extremities:  Bilateral 1+ lower extremity edema, right metatarsal amputation Skin:  Skin breakdown noted on LLE with clean bandaging in place Neurologic:  Alert and oriented x4, still seems grossly confused at times,  asked how he came to the hospital. Moves all extremities. CN II-XII intact. Eyes equal, round, and reactive to light.    Resolved Hospital Problem list     Assessment & Plan:  Chroic Hypotension - Baseline BP is in the 90's per wife CHF>> BNP of 1800 CAD with medical management PFO  LV Clot, apical aneurism.  Atrial fibrillation Hyperlipidemia   - 08/27/2019 - Continues to remain off pressors. New EF 45% (basel;ine in 2018 was 65%), echo also showed new RV systolic dysfunction with severe dilation and an apical LV aneurysm with a moderate fixed thrombus on the apical wall.   Plan -Goals SBP > 95 -Cardiology consult depending on course for drop in EF.    Acute Kidney Injury, Hematuria - Normal creatinine prior to accidental overdose of Lasix     - 08/27/2019 - CRRT per renal. Continued hematuria, thought to be secondary to iatrogenic foley placement in the setting of BPH. Urology consulted, Coude catheter to be placed.   Plan -Appreciate Renal Consult; CVVHD per renal -Urology consulted, appreciated recommendations -Coude catheter placement per Urology -Start finasteride -Hold anticoagulation for now -Bladder scan daily to look at ur op -Trend BMP -Avoid nephrotoxic medications -Maintain renal perfusion -Monitor UO   COPD hx OSA hx Acute Hypoxic Respiratory Failure  -  CXR on admission with chronic R pleural effusion and possible infiltrate. Shortness of breath significantly improved from admission. Afebrile, WBC 8.7 and downtrending.  Plan - Wean O2 as tolerated, maintain sats 88-92% - CPAP at night per home protocol - Discontinued cefepime today -  Follow micro   Cellulitis per L hip abdomen MRSA PCR + and MRSA in urine culture + PCT normal   Plan -Continue IV vancomycin -Cefepime discontinued   Anemia of chronic illness  - 9/14 - HgB: 9.1  Plan Trend CBC Monitor for bleeding  - PRBC for hgb </= 6.9gm%    - exceptions are   -  if ACS  susepcted/confirmed then transfuse for hgb </= 8.0gm%,  or    -  active bleeding with hemodynamic instability, then transfuse regardless of hemoglobin value   At at all times try to transfuse 1 unit prbc as possible with exception of active hemorrhage  DM Plan CBG SSI  Hx CVA/ AMS Plan  Neuro checks per unit protocol  Best practice:  Diet: diet as able.  Bedside swallow eval  08/25/2019 - passed  Pain/Anxiety/Delirium protocol (if indicated): NA VAP protocol (if indicated): NA DVT prophylaxis: Heparin gtt on hold GI prophylaxis: None Glucose control: CBG/ SSI Mobility: BR Code Status: DNR\ Family Communication: Dr. Lamonte Sakai  spoke with patient's wife at length 9/11. This is a second marriage and the patient has children from a previous marriage. She has spoken with the patient's children and has made the patient a DNR as he has a very significant cardiac history. She is in agreement with short term CVVHD and insertion of HD cath.If the patient's kidneys do not rebound, she is not inclined to continue to pursue aggressive treatment measures. There will need to be ongoing goals of care discussions to align goals of care with plan of care..   Disposition: ICU      ATTESTATION & SIGNATURE   The patient Charles [name] is critically ill with multiple organ systems failure and requires high complexity decision making for assessment and support, frequent evaluation and titration of therapies, application of advanced monitoring technologies and extensive interpretation of multiple databases.   Critical Care Time devoted to patient care services described in this note is  30  Minutes. This time reflects time of care of this signee Dr Brand Males. This critical care time does not reflect procedure time, or teaching time or supervisory time of PA/NP/Med student/Med Resident etc but could involve care discussion time     Dr. Brand Males, M.D., [facility].C.P Pulmonary and Critical Care  Medicine Staff Physician [facility] Pager: [phone], If no answer or between  15:00h - 7:00h: call [phone]  08/27/2019 10:48 AM     LABS    PULMONARY Recent Labs  Lab 08/23/19 2359  PHART 7.511*  PCO2ART 38.7  PO2ART 32.0*  HCO3 30.9*  TCO2 32  O2SAT 67.0    CBC Recent Labs  Lab 08/25/19 0343 08/26/19 0413 08/27/19 0452  HGB 9.8* 9.4* 9.1*  HCT 31.6* 29.8* 29.4*  WBC 9.7 9.6 8.7  PLT 211 179 156    COAGULATION Recent Labs  Lab 08/23/19 1845  INR 2.3*    CARDIAC  No results for input(s): TROPONINI in the last 168 hours. No results for input(s): PROBNP in the last 168 hours.   CHEMISTRY Recent Labs  Lab 08/24/19 1237 08/24/19 1720 08/25/19 0343 08/25/19 1730 08/26/19 0413 08/27/19 0452  NA  --  132* 134* 134* 134* 134*  K  --  5.4* 4.9 4.4 4.3 4.3  CL  --  90* 95* 97* 98 101  CO2  --  28 26 27 26 27   GLUCOSE  --  115* 111* 112*  104* 123*  BUN  --  93* 77* 49* 40* 42*  CREATININE  --  5.12* 4.01* 2.67* 2.17* 2.22*  CALCIUM  --  8.9 8.5* 8.2* 8.3* 8.7*  MG 2.0  --  2.1  --  2.2 2.2  PHOS 5.7*  --  4.8* 3.4 2.6  --    Estimated Creatinine Clearance: 40.1 mL/min (A) (by C-G formula based on SCr of 2.22 mg/dL (H)).   LIVER Recent Labs  Lab 08/23/19 1655 08/23/19 1845 08/24/19 1025 08/25/19 0343 08/25/19 1730 08/26/19 0413  AST 22  --  29  --   --   --   ALT 14  --  14  --   --   --   ALKPHOS 55  --  50  --   --   --   BILITOT 1.0  --  1.2  --   --   --   PROT 6.4*  --  6.3*  --   --   --   ALBUMIN 3.0*  --  3.1* 3.0* 2.9* 2.9*  INR  --  2.3*  --   --   --   --      INFECTIOUS Recent Labs  Lab 08/23/19 2005 08/24/19 1025 08/24/19 1041 08/25/19 0343 08/26/19 0413  LATICACIDVEN 3.1*  --  2.4* 1.4  --   PROCALCITON  --  <0.10  --  <0.10 <0.10     ENDOCRINE CBG (last 3)  Recent Labs    08/26/19 2332 08/27/19 0347 08/27/19 [place], MS4

## 2019-08-27 NOTE — Progress Notes (Signed)
[facility]    NEPHROLOGY PROGRESS NOTE  SUBJECTIVE: Patient seen and examined. Denies shortness of breath.  Denies fevers, chills, chest pain, or other symptoms.  Hematuria noted.  Now off CRRT. discussed with bedside RN.   OBJECTIVE:  Vitals:   08/27/19 1300 08/27/19 1400  BP: (!) 104/59 (!) 90/59  Pulse: (!) 105 99  Resp: (!) 23 20  Temp:    SpO2: 91% 100%    Intake/Output Summary (Last 24 hours) at 08/27/2019 1503 Last data filed at 08/27/2019 1343 Gross per 24 hour  Intake 826.87 ml  Output 325 ml  Net 501.87 ml      General:  Lethargic but arousable, NAD HEENT: MMM [place] AT anicteric sclera Neck:  positive JVD, no adenopathy, right IJ temporary dialysis catheter CV:  Heart RRR  Lungs:  Decreased BS bilaterally Abd:  abd SNT/ND with normal BS, obese GU:  Bladder non-palpable Extremities:  (+)1 LE edema. Skin:  No skin rash  MEDICATIONS:  . Chlorhexidine Gluconate Cloth  6 each Topical Daily  . feeding supplement (NEPRO CARB STEADY)  237 mL Oral TID BM  . finasteride  5 mg Oral Daily  . insulin aspart  0-9 Units Subcutaneous Q4H  . lidocaine  1 application Urethral STAT  . mometasone-formoterol  2 puff Inhalation BID  . mupirocin ointment  1 application Nasal BID  . vancomycin variable dose per unstable renal function (pharmacist dosing)   Does not apply See admin instructions       LABS:   CBC Latest Ref Rng & Units 08/27/2019 08/26/2019 08/25/2019  WBC 4.0 - 10.5 K/uL 8.7 9.6 9.7  Hemoglobin 13.0 - 17.0 g/dL 9.1(L) 9.4(L) 9.8(L)  Hematocrit 39.0 - 52.0 % 29.4(L) 29.8(L) 31.6(L)  Platelets 150 - 400 K/uL 156 179 211    CMP Latest Ref Rng & Units 08/27/2019 08/26/2019 08/25/2019  Glucose 70 - 99 mg/dL 123(H) 104(H) 112(H)  BUN 8 - 23 mg/dL 42(H) 40(H) 49(H)  Creatinine 0.61 - 1.24 mg/dL 2.22(H) 2.17(H) 2.67(H)  Sodium 135 - 145 mmol/L 134(L) 134(L) 134(L)  Potassium 3.5 - 5.1 mmol/L 4.3 4.3 4.4  Chloride 98 - 111 mmol/L 101 98 97(L)  CO2 22 -  32 mmol/L 27 26 27   Calcium 8.9 - 10.3 mg/dL 8.7(L) 8.3(L) 8.2(L)  Total Protein 6.5 - 8.1 g/dL - - -  Total Bilirubin 0.3 - 1.2 mg/dL - - -  Alkaline Phos 38 - 126 U/L - - -  AST 15 - 41 U/L - - -  ALT 0 - 44 U/L - - -    Lab Results  Component Value Date   CALCIUM 8.7 (L) 08/27/2019   CAION 1.12 (L) 08/23/2019   PHOS 2.6 08/26/2019       Component Value Date/Time   COLORURINE AMBER (A) 08/23/2019 2231   APPEARANCEUR HAZY (A) 08/23/2019 2231   LABSPEC 1.017 08/23/2019 2231   PHURINE 5.0 08/23/2019 2231   GLUCOSEU NEGATIVE 08/23/2019 2231   GLUCOSEU NEGATIVE 05/25/2019 1353   HGBUR MODERATE (A) 08/23/2019 2231   BILIRUBINUR NEGATIVE 08/23/2019 2231   KETONESUR NEGATIVE 08/23/2019 2231   PROTEINUR 100 (A) 08/23/2019 2231   UROBILINOGEN 1.0 05/25/2019 1353   NITRITE NEGATIVE 08/23/2019 2231   LEUKOCYTESUR SMALL (A) 08/23/2019 2231      Component Value Date/Time   PHART 7.511 (H) 08/23/2019 2359   PCO2ART 38.7 08/23/2019 2359   PO2ART 32.0 (LL) 08/23/2019 2359   HCO3 30.9 (H) 08/23/2019 2359   TCO2 32 08/23/2019 2359  ACIDBASEDEF 2.0 12/26/2015 1327   O2SAT 67.0 08/23/2019 2359       Component Value Date/Time   IRON 47 09/12/2014 1136   IRONPCTSAT 15.1 (L) 09/12/2014 1136       ASSESSMENT/PLAN:      Acute kidney injury.    Baseline creatinine 1.  Urine appears to have blood protein and WBCs.  Urine culture sent.  [place] without obstruction but nodular prostate noted.    Continue foley.  Avoid nephrotoxins.  Avoid ACE inhibitors ARB's.  Avoid contrast.  Status post CRRT.  Urine output has been marginal and punch colored.  Urology to evaluate.  No urgent need for dialysis today.    MRSA UTI with shock.  Improved, now off of pressors.  Echocardiogram with moderate, fixed thrombus on apical wall of the left ventricle, ejection fraction of 45 to 50%, and RV volume overload.  Respiratory failure now on BiPAP.    Improving oxygenation.  Volume overload appears to be  improving.    Atrial fibrillation continues on anticoagulation.  Anemia does not appear to be an issue at this particular time.  Continue to monitor  Diabetes mellitus per primary team  Coronary artery disease per primary team  MRSA UTI.    Hyperkalemia.  Improved.  Patient and family agreeable to dialysis for a short period of time if needed.  Continue to follow closely for dialysis need.  Does have right IJ temporary dialysis catheter.  Urology to evaluate Foley.  [place], DO, [place]

## 2019-08-27 NOTE — Consult Note (Signed)
Subjective: CC: Hematuria.  Hx: Charles [name] is a 78 yo male with multiple comorbidities who I was asked to see in consultation by Dr. Ruthann Cancer for hematuria.   He was admitted on 9/10 with multisystem organ failure.  A voided urine on 9/10 demonstrated pyuria and hematuria and a culture grew MRSA.   He is currently on Cefepime and Vancomycin.  A repeat cath culture on 9/11 was negative.  He was started on a heparin drip and began to have gross hematuria that eventually obstructed the initial foley and a large caliber catheter was inserted today and has been draining dark bloody urine but it clears with irrigation.   He had a renal US on 9/11 that showed no hydro and the bladder had no obvious lesions but the prostate is large and protrudes into the bladder.  He had a similar episode in 2015 but didn't follow up with [place] as requested.  He has chronic incontinence and has been on detrol.   ROS:  Review of Systems  Unable to perform ROS: Medical condition    Allergies  Allergen Reactions  . Tolterodine Swelling and Other (See Comments)    (Detrol) Dry mouth/dry throat, severe swelling of the legs, and started a "downward spiral"  . Lipitor [Atorvastatin] Other (See Comments)    Muscle aches  . Xarelto [Rivaroxaban] Rash    Past Medical History:  Diagnosis Date  . Arthritis   . B12 deficiency   . CAD (coronary artery disease)    a. Approx. 2000 - MI. Cath: single vsl dz, PTCA dLAD/medical rx;  b. NSTEMI 11/13 => IVUS attempted for RCA but not successful; anatomy felt stable from 2000 => med Rx. c. [place] 08/2016: severe stable diffuse dLAD, severe prox RCA -> PCI of RCA unsuccessful; d. 05/2017 NSTEMI/Cath: LM nl, LAD 20ost/p, 74m, 80d, LCX 30ost, 20p, 49m, OM2 90, RCA 80p, 99d.  . Cancer ([place])    skin cancer- head   . Cataracts   . Cholelithiasis   . Chronic diastolic CHF (congestive heart failure) ([place])    a. 05/2017 Echo: EF 60-65%, Gr2 DD, mildly dil Ao, triv MR, mildly to mod dil LA, mild  PR.  . Claustrophobia   . COPD (chronic obstructive pulmonary disease) ([place])   . CVA (cerebral vascular accident) ([place]) ~ 2001   vision inparted from stroke.  Visual Memory loss  . Diabetic neuropathy ([place])   . Diverticulitis   . Dysrhythmia    if he does not take Metoprolol- Afib  . ED (erectile dysfunction)   . Essential hypertension   . History of MRSA infection    Right foot  . History of stomach ulcers   . HOH (hard of hearing)   . Hypercholesterolemia   . Legally blind   . Lower extremity edema   . Morbid obesity ([place])   . Neuropathy   . NSTEMI (non-ST elevated myocardial infarction) ([place]) 05/2017  . OSA on CPAP   . PAF (paroxysmal atrial fibrillation) ([name])    a. confirmed by event monitor. b. 02/2014 rash on Coumadin, patient decided to discontinue Xarelto due to possible rash, cost and lawyers ads on TV, agreed to take Plavix.  . Pneumonia 2016  . Tunnel vision    Since stroke  . Type II diabetes mellitus ([place])   . Venous stasis   . Weakness 01/11/2017    Past Surgical History:  Procedure Laterality Date  . CARDIAC CATHETERIZATION  1990's  . CARDIAC CATHETERIZATION N/A 08/17/2016   Procedure: Left  Heart Cath and Coronary Angiography;  Surgeon: Peter M Martinique, MD;  Location: [facility];  Service: Cardiovascular;  Laterality: N/A;  . CARDIAC CATHETERIZATION N/A 08/19/2016   Procedure: Coronary Balloon Angioplasty;  Surgeon: Peter M Martinique, MD;  Location: [facility];  Service: Cardiovascular;  Laterality: N/A;  . CATARACT EXTRACTION  10/2016  . CEREBRAL ANGIOGRAM  ~ 2000  . COLONOSCOPY    . LEFT HEART CATH AND CORONARY ANGIOGRAPHY N/A 06/01/2017   Procedure: Left Heart Cath and Coronary Angiography;  Surgeon: Burnell Blanks, MD;  Location: [facility];  Service: Cardiovascular;  Laterality: N/A;  . LEFT HEART CATHETERIZATION WITH CORONARY ANGIOGRAM N/A 10/27/2012   Procedure: LEFT HEART CATHETERIZATION WITH CORONARY ANGIOGRAM;  Surgeon:  Burnell Blanks, MD;  Location: [facility];  Service: Cardiovascular;  Laterality: N/A;  . TOE AMPUTATION  2006; 2009   "Dr. Blenda Mounts; big toe left foot; little toe on right foot" (10/26/2012)  . TOE AMPUTATION Right 12/24/2015   2nd & 3 toes/notes 1/13//2017  . TOE AMPUTATION Right    5TH TOE  . 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 Marital status: Married    Spouse name: Not on file  . Number of children: Not on file  . Years of education: Not on file  . Highest education level: Not on file  Occupational History  . Not on file  Social Needs  . Financial resource strain: Not on file  . Food insecurity    Worry: Not on file    Inability: Not on file  . Transportation needs    Medical: Not on file    Non-medical: Not on file  Tobacco Use  . Smoking status: Former Smoker    Packs/day: 1.00    Years: 30.00    Pack years: 30.00    Types: Cigarettes, Cigars  . Smokeless tobacco: Former Systems developer    Quit date: 08/15/2016  . Tobacco comment: 12/2016 quit smoking in 1998  Substance and Sexual Activity  . Alcohol use: Yes    Comment: 12/2016   rare   . Drug use: No  . Sexual activity: Yes  Lifestyle  . Physical activity    Days per week: Not on file    Minutes per session: Not on file  . Stress: Not on file  Relationships  . Social Herbalist on phone: Not on file    Gets together: Not on file    Attends religious service: Not on file    Active member of club or organization: Not on file    Attends meetings of clubs or organizations: Not on file    Relationship status: Not on file  . Intimate partner violence    Fear of current or ex partner: Not on file    Emotionally abused: Not on file    Physically abused: Not on file    Forced sexual activity: Not on file  Other Topics  Concern  . Not on file  Social History Narrative   Disabled s/p CVA    Family History  Problem Relation Age of Onset  . Breast cancer Mother 10  . Heart disease Father 10  . Asthma Neg Hx     Anti-infectives: Anti-infectives (From admission, onward)   Start     Dose/Rate Route Frequency  Ordered Stop   08/27/19 2100  ceFEPIme (MAXIPIME) 1 g in sodium chloride 0.9 % 100 mL IVPB  Status:  Discontinued     1 g 200 mL/hr over 30 Minutes Intravenous Every 24 hours 08/27/19 0729 08/27/19 1022   08/27/19 1800  vancomycin (VANCOCIN) 1,250 mg in sodium chloride 0.9 % 250 mL IVPB     1,250 mg 166.7 mL/hr over 90 Minutes Intravenous  Once 08/27/19 1750 08/27/19 1951   08/27/19 0726  vancomycin variable dose per unstable renal function (pharmacist dosing)      Does not apply See admin instructions 08/27/19 0726     08/25/19 1600  vancomycin (VANCOCIN) 1,250 mg in sodium chloride 0.9 % 250 mL IVPB  Status:  Discontinued     1,250 mg 166.7 mL/hr over 90 Minutes Intravenous Every 24 hours 08/24/19 1803 08/27/19 0726   08/24/19 1815  ceFEPIme (MAXIPIME) 2 g in sodium chloride 0.9 % 100 mL IVPB  Status:  Discontinued     2 g 200 mL/hr over 30 Minutes Intravenous Every 12 hours 08/24/19 1803 08/27/19 0729   08/24/19 1545  ceFEPIme (MAXIPIME) 500 mg in dextrose 5 % 50 mL IVPB  Status:  Discontinued     500 mg 100 mL/hr over 30 Minutes Intravenous Every 24 hours 08/24/19 1537 08/24/19 1803   08/24/19 1545  vancomycin (VANCOCIN) 2,000 mg in sodium chloride 0.9 % 500 mL IVPB     2,000 mg 250 mL/hr over 120 Minutes Intravenous  Once 08/24/19 1537 08/24/19 1848   08/24/19 1537  vancomycin variable dose per unstable renal function (pharmacist dosing)  Status:  Discontinued      Does not apply See admin instructions 08/24/19 1537 08/24/19 1803   08/23/19 1730  cefTRIAXone (ROCEPHIN) 2 g in sodium chloride 0.9 % 100 mL IVPB  Status:  Discontinued     2 g 200 mL/hr over 30 Minutes Intravenous Every 24  hours 08/23/19 1721 08/24/19 1526   08/23/19 1730  doxycycline (VIBRAMYCIN) 100 mg in sodium chloride 0.9 % 250 mL IVPB     100 mg 125 mL/hr over 120 Minutes Intravenous  Once 08/23/19 1721 08/23/19 1956      Current Facility-Administered Medications  Medication Dose Route Frequency Provider Last Rate Last Dose  . 0.9 %  sodium chloride infusion   Intravenous Continuous Magdalen Spatz, NP 10 mL/hr at 08/27/19 2000    . 0.9 %  sodium chloride infusion  250 mL Intravenous Continuous Magdalen Spatz, NP      . acetaminophen (TYLENOL) tablet 650 mg  650 mg Oral Q4H PRN Corey Harold, NP   650 mg at 08/27/19 1652  . albuterol (PROVENTIL) (2.5 MG/3ML) 0.083% nebulizer solution 2.5 mg  2.5 mg Inhalation Q6H PRN Rise Patience, MD      . Chlorhexidine Gluconate Cloth 2 % PADS 6 each  6 each Topical Daily Brand Males, MD   6 each at 08/27/19 0000  . feeding supplement (NEPRO CARB STEADY) liquid 237 mL  237 mL Oral TID BM Finnigan, Nancy A, DO   237 mL at 08/27/19 1933  . finasteride (PROSCAR) tablet 5 mg  5 mg Oral Daily Audria Nine, DO   5 mg at 08/27/19 1135  . heparin injection 1,000-6,000 Units  1,000-6,000 Units CRRT PRN Finnigan, Izora Gala A, DO   2,400 Units at 08/26/19 1316  . insulin aspart (novoLOG) injection 0-9 Units  0-9 Units Subcutaneous Q4H Rise Patience, MD   2 Units at 08/27/19 1542  .  lidocaine (XYLOCAINE) 2 % jelly 1 application  1 application Urethral STAT Irine Seal, MD      . mometasone-formoterol [facility]) 100-5 MCG/ACT inhaler 2 puff  2 puff Inhalation BID Rise Patience, MD   2 puff at 08/27/19 1929  . mupirocin ointment (BACTROBAN) 2 % 1 application  1 application Nasal BID Brand Males, MD   1 application at [id number]  . ondansetron (ZOFRAN) tablet 4 mg  4 mg Oral Q6H PRN Rise Patience, MD       Or  . ondansetron [facility]) injection 4 mg  4 mg Intravenous Q6H PRN Rise Patience, MD      . oxyCODONE (Oxy IR/ROXICODONE)  immediate release tablet 5 mg  5 mg Oral Q6H PRN Corey Harold, NP      . vancomycin variable dose per unstable renal function (pharmacist dosing)   Does not apply See admin instructions Rolla Flatten, [facility]         Objective: Vital signs in last 24 hours: Temp:  [97.5 F (36.4 C)-99.3 F (37.4 C)] 98.4 F (36.9 C) (09/14 2000) Pulse Rate:  [81-129] 81 (09/14 2000) Resp:  [16-27] 20 (09/14 2000) BP: (83-138)/(46-88) 105/88 (09/14 2000) SpO2:  [91 %-100 %] 94 % (09/14 2000) Weight:  [128.2 kg] 128.2 kg (09/14 0457)  Intake/Output from previous day: 09/13 0701 - 09/14 0700 In: 789.7 [P.O.:237; I.V.:62; IV Piggyback:450.8] Out: 1079 [Urine:315] Intake/Output this shift: Total I/O In: 312.2 [I.V.:2.2; Other:60; IV Piggyback:250] Out: 105 [Urine:105]   Physical Exam Vitals signs reviewed.  Constitutional:      Comments: Elderly, ill appearing male in NAD.  HENT:     Head: Normocephalic and atraumatic.  Genitourinary:    Comments: Urine dark bloody in tubing.     Lab Results:  Recent Labs    08/26/19 0413 08/27/19 0452  WBC 9.6 8.7  HGB 9.4* 9.1*  HCT 29.8* 29.4*  PLT 179 156   BMET Recent Labs    08/26/19 0413 08/27/19 0452  NA 134* 134*  K 4.3 4.3  CL 98 101  CO2 26 27  GLUCOSE 104* 123*  BUN 40* 42*  CREATININE 2.17* 2.22*  CALCIUM 8.3* 8.7*   PT/INR No results for input(s): LABPROT, INR in the last 72 hours. ABG No results for input(s): PHART, HCO3 in the last 72 hours.  Invalid input(s): PCO2, PO2  Studies/Results: Dg Humerus Left  Result Date: 08/27/2019 CLINICAL DATA:  Recent fall. Left humerus pain. EXAM: LEFT HUMERUS - 2+ VIEW COMPARISON:  None. FINDINGS: An impacted fracture of the left humeral neck is seen. No evidence of shoulder dislocation. Generalized osteopenia is demonstrated. Degenerative spurring of acromioclavicular joint and distal acromion process noted. IMPRESSION: Impacted fracture of left humeral neck. Electronically  Signed   By: Marlaine Hind M.D.   On: 08/27/2019 17:17   I have reviewed his renal US.  He has a large prostate with intravesical protrusion.    Assessment: Gross hematuria on heparin with BPH and chronic incontinence.  MRSA UTI on 9/10 but cath specimen on 9/11 had a negative culture.  Marland Kitchen    His urine remains dark but clears with irrigation.   Continue qshift and prn irrigation.  If there is no fresh bleeding, consider resumption of heparin in the morning.  Difficult foley placement now with a 31fr coude that is draining well.     CC: Dr. Audria Nine     Irine Seal 08/27/2019 [phone]

## 2019-08-27 NOTE — Plan of Care (Signed)
  Problem: Clinical Measurements: Goal: Diagnostic test results will improve Outcome: Progressing Goal: Respiratory complications will improve Outcome: Progressing   

## 2019-08-28 DIAGNOSIS — S42202D Unspecified fracture of upper end of left humerus, subsequent encounter for fracture with routine healing: Secondary | ICD-10-CM

## 2019-08-28 LAB — CULTURE, BLOOD (ROUTINE X 2)
Culture: NO GROWTH
Culture: NO GROWTH
Special Requests: ADEQUATE

## 2019-08-28 LAB — BASIC METABOLIC PANEL
Anion gap: 9 (ref 5–15)
BUN: 49 mg/dL — ABNORMAL HIGH (ref 8–23)
CO2: 25 mmol/L (ref 22–32)
Calcium: 9 mg/dL (ref 8.9–10.3)
Chloride: 100 mmol/L (ref 98–111)
Creatinine, Ser: 2.34 mg/dL — ABNORMAL HIGH (ref 0.61–1.24)
GFR calc Af Amer: 30 mL/min — ABNORMAL LOW (ref >60)
GFR calc non Af Amer: 26 mL/min — ABNORMAL LOW (ref >60)
Glucose, Bld: 143 mg/dL — ABNORMAL HIGH (ref 70–99)
Potassium: 4.4 mmol/L (ref 3.5–5.1)
Sodium: 134 mmol/L — ABNORMAL LOW (ref 135–145)

## 2019-08-28 LAB — GLUCOSE, CAPILLARY
Glucose-Capillary: 113 mg/dL — ABNORMAL HIGH (ref 70–99)
Glucose-Capillary: 134 mg/dL — ABNORMAL HIGH (ref 70–99)
Glucose-Capillary: 134 mg/dL — ABNORMAL HIGH (ref 70–99)
Glucose-Capillary: 160 mg/dL — ABNORMAL HIGH (ref 70–99)
Glucose-Capillary: 160 mg/dL — ABNORMAL HIGH (ref 70–99)
Glucose-Capillary: 177 mg/dL — ABNORMAL HIGH (ref 70–99)
Glucose-Capillary: 193 mg/dL — ABNORMAL HIGH (ref 70–99)

## 2019-08-28 LAB — HEPARIN LEVEL (UNFRACTIONATED): Heparin Unfractionated: 1.46 IU/mL — ABNORMAL HIGH (ref 0.30–0.70)

## 2019-08-28 LAB — MAGNESIUM: Magnesium: 2.1 mg/dL (ref 1.7–2.4)

## 2019-08-28 LAB — VANCOMYCIN, RANDOM: Vancomycin Rm: 16

## 2019-08-28 MED ORDER — HEPARIN (PORCINE) 25000 UT/250ML-% IV SOLN
1500.0000 [IU]/h | INTRAVENOUS | Status: DC
  Administered 2019-08-28: 1500 [IU]/h via INTRAVENOUS
  Filled 2019-08-28: qty 250

## 2019-08-28 MED ORDER — HEPARIN (PORCINE) 25000 UT/250ML-% IV SOLN
900.0000 [IU]/h | INTRAVENOUS | Status: DC
  Administered 2019-08-29 – 2019-09-01 (×5): 1250 [IU]/h via INTRAVENOUS
  Administered 2019-09-02 – 2019-09-04 (×3): 900 [IU]/h via INTRAVENOUS
  Filled 2019-08-28 (×7): qty 250

## 2019-08-28 MED ORDER — SODIUM CHLORIDE 0.9 % IV BOLUS
500.0000 mL | Freq: Once | INTRAVENOUS | Status: AC
  Administered 2019-08-28: 10:00:00 500 mL via INTRAVENOUS

## 2019-08-28 MED ORDER — VANCOMYCIN HCL 10 G IV SOLR
1250.0000 mg | INTRAVENOUS | Status: AC
  Administered 2019-08-28 – 2019-08-30 (×3): 1250 mg via INTRAVENOUS
  Filled 2019-08-28 (×3): qty 1250

## 2019-08-28 NOTE — Progress Notes (Signed)
Pharmacy Antibiotic Note  Charles [name] is a 78 y.o. male admitted on 08/23/2019 with L-hip cellulitis and concern for UTI. Pharmacy has been consulted for Vancomycin and Cefepime dosing.  The patient was noted to have AKI with SCr 5.05 on admission - s/p CRRT 9/11 >> 9/14, SCr 2.34 this AM off CRRT, UOP 0.1 ml/kg/hr, CrCl hard to estimate - will monitor SCr and UOP trends since stopping CRRT.   Plan: - Start Vancomycin 1250 mg IV q24 hr - Will continue to follow renal function, culture results, LOT, and antibiotic de-escalation plans    Height: 6\' 4"  (193 cm) Weight: 286 lb 13.1 oz (130.1 kg) IBW/kg (Calculated) : 86.8  Temp (24hrs), Avg:98.3 F (36.8 C), Min:97.5 F (36.4 C), Max:98.9 F (37.2 C)  Recent Labs  Lab 08/23/19 1655 08/23/19 1721 08/23/19 2005 08/24/19 1025 08/24/19 1041  08/25/19 0343 08/25/19 1730 08/26/19 0413 08/27/19 0452 08/27/19 1653 08/28/19 0415 08/28/19 2056  WBC 12.3*  --   --  12.4*  --   --  9.7  --  9.6 8.7  --   --   --   CREATININE 4.58*  --   --  5.05*  --    < > 4.01* 2.67* 2.17* 2.22*  --  2.34*  --   LATICACIDVEN  --  3.0* 3.1*  --  2.4*  --  1.4  --   --   --   --   --   --   VANCORANDOM  --   --   --   --   --   --   --   --   --   --  10  --  16   < > = values in this interval not displayed.    Estimated Creatinine Clearance: 38.3 mL/min (A) (by C-G formula based on SCr of 2.34 mg/dL (H)).    Allergies  Allergen Reactions  . Tolterodine Swelling and Other (See Comments)    (Detrol) Dry mouth/dry throat, severe swelling of the legs, and started a "downward spiral"  . Lipitor [Atorvastatin] Other (See Comments)    Muscle aches  . Xarelto [Rivaroxaban] Rash    CTX 9/10 x 1 Doxy 9/10 x 1 Vanc 9/11 >> Cefepime 9/11 >>9/14  9/10 COVID >> neg 9/10 BCx >> [id number] 9/10 UCx >> 50k MRSA 9/11 UCx >> NG  Thank you for allowing pharmacy to be a part of this patient's care.  Alanda Slim, PharmD, [facility] Clinical Pharmacist Please see  AMION for all Pharmacists' Contact Phone Numbers 08/28/2019, 9:46 PM

## 2019-08-28 NOTE — Progress Notes (Signed)
ANTICOAGULATION CONSULT NOTE   Pharmacy Consult for Heparin Indication: atrial fibrillation and LV Thrombus  Allergies  Allergen Reactions  . Tolterodine Swelling and Other (See Comments)    (Detrol) Dry mouth/dry throat, severe swelling of the legs, and started a "downward spiral"  . Lipitor [Atorvastatin] Other (See Comments)    Muscle aches  . Xarelto [Rivaroxaban] Rash    Patient Measurements: Height: 6\' 4"  (193 cm) Weight: 286 lb 13.1 oz (130.1 kg) IBW/kg (Calculated) : 86.8 Heparin Dosing Weight: 115 kg  Vital Signs: Temp: 98.5 F (36.9 C) (09/15 2043) Temp Source: Oral (09/15 2043) BP: 112/66 (09/15 2043) Pulse Rate: 87 (09/15 2043)  Labs: Recent Labs    08/26/19 0413 08/27/19 0452 08/28/19 0415 08/28/19 2056  HGB 9.4* 9.1*  --   --   HCT 29.8* 29.4*  --   --   PLT 179 156  --   --   APTT 38* 39*  --   --   HEPARINUNFRC >2.20* >2.20*  --  1.46*  CREATININE 2.17* 2.22* 2.34*  --     Estimated Creatinine Clearance: 38.3 mL/min (A) (by C-G formula based on SCr of 2.34 mg/dL (H)).  Assessment: 79 y.o. male with h/o Afib, Eliquis on hold, for heparin.  ECHO today revealed an LV thrombus.  Heparin had been on hold for multiple days due to hematuria  Restarted today.  Level came back supratherapeutic.  Goal of Therapy:  APTT 66-102 Heparin level 0.3-0.7 units/ml Monitor platelets by anticoagulation protocol: Yes   Plan:  Hold Heparin drip for 1 hour Then Resume heparin 1250 units/hr Daily HL CBC  Alanda Slim, PharmD, [place] Clinical Pharmacist Please see AMION for all Pharmacists' Contact Phone Numbers 08/28/2019, 10:17 PM

## 2019-08-28 NOTE — Progress Notes (Signed)
RT attempted to place patient on CPAP twice.  Patient very confused at this time.  Patient will not tolerate CPAP.  Patient vitals stable no distress noted.  RT will continue to monitor.

## 2019-08-28 NOTE — Progress Notes (Signed)
PROGRESS NOTE  Charles [name] [id number] DOB: November 28, 1941 DOA: 08/23/2019 PCP: Cassandria Anger, MD  Brief History   78 y.o.malewithhistory of CAD managed medically, chronic diastolic CHF, diabetes mellitus, sleep apnea, COPD, history of CVA, atrial fibrillation who was recently admitted and discharged 3 weeks ago after patient inadvertently took overdose of Lasix.    Patient was observed in the hospital and eventually discharged to rehab on torsemide. At the time patient also was started on apixaban for A. fib.  And eventually was discharged back to home. Patient wife states that patient has been doing poorly since discharge from the hospital and has gradually experienced worsening  shortness of breath with increasing lower extremity edema. Two days prior to admission patient had become very short of breath with minimal exertion. Has been eating poorly. Has not had any nausea vomiting or diarrhea. Has not had any fever chills. Did not lose consciousness.  Pt. Was Admitted for acute hypoxic respiratory failure with anasarca,  acute renal failure, and heart failure. Pt was found to have an infiltrate on CXR and has had very soft BP since admission. Overnight 9/11  BP dropped to 65/40 ( Checked both arms), pt was bolused with 500 cc's IVF and started on maintenance fluids at 100 cc / hr.  As BP will need pressor support, and it appears patient will need higher level of care, PCCM were consulted to admit to ICU and manage care. Patient has had continued hematuria and there has been concern for foley blockage with decreased UO. On 9/14, urology was consulted and the decision was made to place a Coude catheter to circumnavigate the patient's BPH and assist with clot passage.  Nephrology was consulted and the patient has required CRRT. That was stopped on 08/26/2019. Creatinine has stabilized and dialysis is on hold for the current time.   Echocardiogram was performed on 08/24/2019: It  demonstrated :  Mild dyskinesis of the left ventricular, entire apical segment.  2. Apical left ventricular aneurysm.  3. Moderate, fixed thrombus on the apical wall of the left ventricle.  4. The left ventricle has mildly reduced systolic function, with an ejection fraction of 45-50%. The cavity size was normal. Left ventricular diastolic function could not be evaluated secondary to atrial fibrillation. There is right ventricular volume  and pressure overload.  5. The right ventricle has severely reduced systolic function. The cavity was severely enlarged. There is no increase in right ventricular wall thickness. Right ventricular systolic pressure is moderately elevated.  6. Left atrial size was moderately dilated.  7. Right atrial size was severely dilated.  8. Mild mitral valve prolapse, probably due to left heart "underfilling".  9. The mitral valve is grossly normal. Mild thickening of the mitral valve leaflet. There is mild to moderate mitral annular calcification present. 10. The tricuspid valve is grossly normal. Tricuspid valve regurgitation is moderate-severe. 11. Mild thickening of the aortic valve. Sclerosis without any evidence of stenosis of the aortic valve. 12. The aorta is normal unless otherwise noted. 13. The inferior vena cava was dilated in size with <50% respiratory variability. 14. Evidence of right to left atrial level shunting detected by color flow Doppler. 15. When compared to the prior study: from 2018, there are multiple changes. The most striking abnormality is severe dilation and severe dysfunction of the right ventricle, with evidence of pressure and volume overload. 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 Although not previously described, there are  findings that suggest this is a more chronic abnormality.  There is probably a small "stretch" patent foramen ovale with intermittent high velocity,  but low volume right to left atrial shunting.  Consultants  . Nephrology . Urology . Orthopedic Surgery  Procedures  . CRRT  Antibiotics   Anti-infectives (From admission, onward)   Start     Dose/Rate Route Frequency Ordered Stop   08/27/19 2100  ceFEPIme (MAXIPIME) 1 g in sodium chloride 0.9 % 100 mL IVPB  Status:  Discontinued     1 g 200 mL/hr over 30 Minutes Intravenous Every 24 hours 08/27/19 0729 08/27/19 1022   08/27/19 1800  vancomycin (VANCOCIN) 1,250 mg in sodium chloride 0.9 % 250 mL IVPB     1,250 mg 166.7 mL/hr over 90 Minutes Intravenous  Once 08/27/19 1750 08/27/19 1951   08/27/19 0726  vancomycin variable dose per unstable renal function (pharmacist dosing)      Does not apply See admin instructions 08/27/19 0726     08/25/19 1600  vancomycin (VANCOCIN) 1,250 mg in sodium chloride 0.9 % 250 mL IVPB  Status:  Discontinued     1,250 mg 166.7 mL/hr over 90 Minutes Intravenous Every 24 hours 08/24/19 1803 08/27/19 0726   08/24/19 1815  ceFEPIme (MAXIPIME) 2 g in sodium chloride 0.9 % 100 mL IVPB  Status:  Discontinued     2 g 200 mL/hr over 30 Minutes Intravenous Every 12 hours 08/24/19 1803 08/27/19 0729   08/24/19 1545  ceFEPIme (MAXIPIME) 500 mg in dextrose 5 % 50 mL IVPB  Status:  Discontinued     500 mg 100 mL/hr over 30 Minutes Intravenous Every 24 hours 08/24/19 1537 08/24/19 1803   08/24/19 1545  vancomycin (VANCOCIN) 2,000 mg in sodium chloride 0.9 % 500 mL IVPB     2,000 mg 250 mL/hr over 120 Minutes Intravenous  Once 08/24/19 1537 08/24/19 1848   08/24/19 1537  vancomycin variable dose per unstable renal function (pharmacist dosing)  Status:  Discontinued      Does not apply See admin instructions 08/24/19 1537 08/24/19 1803   08/23/19 1730  cefTRIAXone (ROCEPHIN) 2 g in sodium chloride 0.9 % 100 mL IVPB  Status:  Discontinued     2 g 200 mL/hr over 30 Minutes Intravenous Every 24 hours 08/23/19 1721 08/24/19 1526   08/23/19 1730  doxycycline  (VIBRAMYCIN) 100 mg in sodium chloride 0.9 % 250 mL IVPB     100 mg 125 mL/hr over 120 Minutes Intravenous  Once 08/23/19 1721 08/23/19 1956    .   Subjective  The patient is resting quietly. No new complaints.  Objective   Vitals:  Vitals:   08/28/19 1446 08/28/19 1625  BP: 115/61 (!) 105/53  Pulse: 80 89  Resp: (!) 22 20  Temp: 98.9 F (37.2 C) 98.1 F (36.7 C)  SpO2: 96% 98%    Exam:  Exam:  Constitutional:  . The patient is awake, alert, and oriented x 3. No acute distress. Respiratory:  . No increased work of breathing. . No wheezes, rales, or rhonchi . No tactile fremitus Cardiovascular:  . Regular rate and rhythm . No murmurs, ectopy, or gallups. . No lateral PMI. No thrills. Abdomen:  . Abdomen is soft, non-tender, non-distended . No hernias, masses, or organomegaly . Normoactive bowel sounds.  Musculoskeletal:  . No cyanosis, clubbing, or edema Skin:  . No rashes, lesions, ulcers . palpation of skin: no induration or nodules Neurologic:  . CN 2-12 intact . Sensation  all 4 extremities intact Psychiatric:  . Mental status o Mood, affect appropriate o Orientation to person, place, time  . judgment and insight appear intact  I have personally reviewed the following:   Today's Data  . Vitals, CBC, BMP  Micro Data  . Ucx positive for MRSA  Imaging  . CT left humerus - impacted fracture, non-displaced.  Cardiology Data  . Echocardiogram  Scheduled Meds: . Chlorhexidine Gluconate Cloth  6 each Topical Daily  . feeding supplement (NEPRO CARB STEADY)  237 mL Oral TID BM  . finasteride  5 mg Oral Daily  . insulin aspart  0-9 Units Subcutaneous Q4H  . mometasone-formoterol  2 puff Inhalation BID  . mupirocin ointment  1 application Nasal BID  . vancomycin variable dose per unstable renal function (pharmacist dosing)   Does not apply See admin instructions   Continuous Infusions: . sodium chloride 500 mL/hr at 08/28/19 0946  . sodium  chloride    . heparin 1,500 Units/hr (08/28/19 1412)    Principal Problem:   ARF (acute renal failure) ([name]) Active Problems:   CAD (coronary artery disease)   Type 2 diabetes, uncontrolled, with retinopathy ([name])   PAF (paroxysmal atrial fibrillation) ([name])   Shock ([name])   Hypotension   Lactic acidosis   LOS: 5 days   A & P  Acute Kidney Injury, Hematuria - Normal creatinine prior to accidental overdose of Lasix     - 08/27/2019 - CRRT per renal. Continued hematuria, thought to be secondary to iatrogenic foley placement in the setting of BPH. Urology consulted, Coude catheter to be placed.   Plan -Appreciate Renal Consult; CVVHD per renal -Urology consulted, appreciated recommendations -Coude catheter placement per Urology -Start finasteride -Hold anticoagulation for now -Bladder scan daily to look at ur op -Trend BMP -Avoid nephrotoxic medications -Maintain renal perfusion -Monitor UO  Acute Hypoxic Respiratory Failure: CXR on admission with chronic R pleural effusion and possible infiltrate. Shortness of breath significantly improved from admission. Afebrile, WBC 8.7 and downtrending. Will wean O2 as possible maintaining SaO2 of 88-92%.  Complicated by COPD and OSA. CPAP at night as at home.  MRSA UTI: Vancomycin as per pharmacy.  Cellulitis of left hip and abdomen: Vancomycin as per pharmacy.  Fracture of the left humeral head: I have Consulted Dr. French Ana for Orthopedic surgery. He will see the patient. He feels that he will probably just need a sling and follow up in the office,  Anemia of chronic disease: HgB: 9.Check iron studies.  Monitor for bleeding. Continue heparin drip for left Ventricular mural thrombus. Transfuse for hemoglobin less than 7.0.  DM II: Glucoses are being followed with FSBS and SSI. Metformin held. Glucoses have been running 113 - 182 in the last 24 hours.  I have seen and examined this patient myself. I have spent 45 minutes in his  evaluation and care as well as coordination of care with Dr. French Ana.  DVT prophylaxis: Heparin gtt on hold Code Status: DNR Family Communication: Dr. Lamonte Sakai  spoke with patient's wife at length 9/11. This is a second marriage and the patient has children from a previous marriage. She has spoken with the patient's children and has made the patient a DNR as he has a very significant cardiac history. She is in agreement with short term CVVHD and insertion of HD cath.If the patient's kidneys do not rebound, she is not inclined to continue to pursue aggressive treatment measures. There will need to be ongoing goals of care discussions to  align goals of care with plan of care.  Wife at bedside during visit today.   Amarie Tarte, DO [facility] Direct contact: see [email or web address]  7PM-7AM contact night coverage as above 08/28/2019, 4:35 PM  LOS: 5 days

## 2019-08-28 NOTE — Progress Notes (Signed)
Called and updated spouse on transfer to [id number]. Modena Morrow E, RN 08/28/2019 2:06 PM

## 2019-08-28 NOTE — Progress Notes (Signed)
ANTICOAGULATION CONSULT NOTE   Pharmacy Consult for Heparin Indication: atrial fibrillation and LV Thrombus  Allergies  Allergen Reactions  . Tolterodine Swelling and Other (See Comments)    (Detrol) Dry mouth/dry throat, severe swelling of the legs, and started a "downward spiral"  . Lipitor [Atorvastatin] Other (See Comments)    Muscle aches  . Xarelto [Rivaroxaban] Rash    Patient Measurements: Height: 6\' 4"  (193 cm) Weight: 286 lb 13.1 oz (130.1 kg) IBW/kg (Calculated) : 86.8 Heparin Dosing Weight: 115 kg  Vital Signs: Temp: 98 F (36.7 C) (09/15 0700) Temp Source: Oral (09/15 0700) BP: 90/59 (09/15 0920) Pulse Rate: 86 (09/15 1000)  Labs: Recent Labs    08/26/19 0413 08/27/19 0452 08/28/19 0415  HGB 9.4* 9.1*  --   HCT 29.8* 29.4*  --   PLT 179 156  --   APTT 38* 39*  --   HEPARINUNFRC >2.20* >2.20*  --   CREATININE 2.17* 2.22* 2.34*    Estimated Creatinine Clearance: 38.3 mL/min (A) (by C-G formula based on SCr of 2.34 mg/dL (H)).  Assessment: 78 y.o. male with h/o Afib, Eliquis on hold, for heparin.  ECHO today revealed an LV thrombus.  Heparin had been on hold for multiple days due to hematuria  Now resolved with plans to restart per [facility] and urology  Goal of Therapy:  APTT 66-102 Heparin level 0.3-0.7 units/ml Monitor platelets by anticoagulation protocol: Yes   Plan:  Resume heparin 1500 units/hr Initial HL 1700 Daily HL CBC  Levester Fresh, PharmD, BCPS, BCCCP Clinical Pharmacist [phone]  Please check AMION for all [place] numbers  08/28/2019 10:29 AM

## 2019-08-28 NOTE — Progress Notes (Signed)
Patient was transfer from [place]. Patient is now resting comfortable.

## 2019-08-28 NOTE — Progress Notes (Signed)
[facility]    NEPHROLOGY PROGRESS NOTE  SUBJECTIVE:  Reports no SOB, CP.  Feeling well.  Hard of hearing   OBJECTIVE:  Vitals:   08/28/19 0920 08/28/19 1000  BP: (!) 90/59   Pulse: 85 86  Resp: 12 20  Temp:    SpO2: 98% 100%    Intake/Output Summary (Last 24 hours) at 08/28/2019 1101 Last data filed at 08/28/2019 1000 Gross per 24 hour  Intake 1237.15 ml  Output 930 ml  Net 307.15 ml      General:  Sleeping, arousable HEENT: dry MM Neck:  R IJ HD catheter CV:  Heart RRR  Lungs:  Decreased BS bilaterally Abd:  abd SNT/ND with normal BS, obese Extremities:  No LE edema Skin:  No skin rash  MEDICATIONS:  . Chlorhexidine Gluconate Cloth  6 each Topical Daily  . feeding supplement (NEPRO CARB STEADY)  237 mL Oral TID BM  . finasteride  5 mg Oral Daily  . insulin aspart  0-9 Units Subcutaneous Q4H  . mometasone-formoterol  2 puff Inhalation BID  . mupirocin ointment  1 application Nasal BID  . vancomycin variable dose per unstable renal function (pharmacist dosing)   Does not apply See admin instructions       LABS:   CBC Latest Ref Rng & Units 08/27/2019 08/26/2019 08/25/2019  WBC 4.0 - 10.5 K/uL 8.7 9.6 9.7  Hemoglobin 13.0 - 17.0 g/dL 9.1(L) 9.4(L) 9.8(L)  Hematocrit 39.0 - 52.0 % 29.4(L) 29.8(L) 31.6(L)  Platelets 150 - 400 K/uL 156 179 211    CMP Latest Ref Rng & Units 08/28/2019 08/27/2019 08/26/2019  Glucose 70 - 99 mg/dL 143(H) 123(H) 104(H)  BUN 8 - 23 mg/dL 49(H) 42(H) 40(H)  Creatinine 0.61 - 1.24 mg/dL 2.34(H) 2.22(H) 2.17(H)  Sodium 135 - 145 mmol/L 134(L) 134(L) 134(L)  Potassium 3.5 - 5.1 mmol/L 4.4 4.3 4.3  Chloride 98 - 111 mmol/L 100 101 98  CO2 22 - 32 mmol/L 25 27 26   Calcium 8.9 - 10.3 mg/dL 9.0 8.7(L) 8.3(L)  Total Protein 6.5 - 8.1 g/dL - - -  Total Bilirubin 0.3 - 1.2 mg/dL - - -  Alkaline Phos 38 - 126 U/L - - -  AST 15 - 41 U/L - - -  ALT 0 - 44 U/L - - -    Lab Results  Component Value Date   CALCIUM 9.0  08/28/2019   CAION 1.12 (L) 08/23/2019   PHOS 2.6 08/26/2019       Component Value Date/Time   COLORURINE AMBER (A) 08/23/2019 2231   APPEARANCEUR HAZY (A) 08/23/2019 2231   LABSPEC 1.017 08/23/2019 2231   PHURINE 5.0 08/23/2019 2231   GLUCOSEU NEGATIVE 08/23/2019 2231   GLUCOSEU NEGATIVE 05/25/2019 1353   HGBUR MODERATE (A) 08/23/2019 2231   BILIRUBINUR NEGATIVE 08/23/2019 2231   KETONESUR NEGATIVE 08/23/2019 2231   PROTEINUR 100 (A) 08/23/2019 2231   UROBILINOGEN 1.0 05/25/2019 1353   NITRITE NEGATIVE 08/23/2019 2231   LEUKOCYTESUR SMALL (A) 08/23/2019 2231      Component Value Date/Time   PHART 7.511 (H) 08/23/2019 2359   PCO2ART 38.7 08/23/2019 2359   PO2ART 32.0 (LL) 08/23/2019 2359   HCO3 30.9 (H) 08/23/2019 2359   TCO2 32 08/23/2019 2359   ACIDBASEDEF 2.0 12/26/2015 1327   O2SAT 67.0 08/23/2019 2359       Component Value Date/Time   IRON 47 09/12/2014 1136   IRONPCTSAT 15.1 (L) 09/12/2014 1136       ASSESSMENT/PLAN:  Acute kidney injury.    Baseline creatinine 1.  Urine appears to have blood protein and WBCs.  Urine culture sent.  [place] without obstruction but nodular prostate noted.    Continue foley.  Avoid nephrotoxins.  Avoid ACE inhibitors ARB's.  Avoid contrast.  Status post CRRT.  Urine output improved, Cr appears to be stable, will continue to hold dialysis.  No indication for today.    Hematuria- s/p urology evaluation, appreciate.  Flush catheter q shift  MRSA UTI with shock.  Improved, now off of pressors.  Echocardiogram 9/11 with moderate, fixed thrombus on apical wall of the left ventricle, ejection fraction of 45 to 50%, and RV volume overload.  Respiratory failure: off BiPAP.    Improving oxygenation.  Volume overload appears to be improving.    Atrial fibrillation continues on anticoagulation.  Anemia does not appear to be an issue at this particular time.  Continue to monitor  Diabetes mellitus per primary team  Coronary artery disease  per primary team  MRSA UTI.    Hyperkalemia.  Improved.  Madelon Lips MD

## 2019-08-29 DIAGNOSIS — I25118 Atherosclerotic heart disease of native coronary artery with other forms of angina pectoris: Secondary | ICD-10-CM

## 2019-08-29 DIAGNOSIS — E1151 Type 2 diabetes mellitus with diabetic peripheral angiopathy without gangrene: Secondary | ICD-10-CM

## 2019-08-29 DIAGNOSIS — I480 Paroxysmal atrial fibrillation: Secondary | ICD-10-CM

## 2019-08-29 DIAGNOSIS — I872 Venous insufficiency (chronic) (peripheral): Secondary | ICD-10-CM

## 2019-08-29 DIAGNOSIS — I236 Thrombosis of atrium, auricular appendage, and ventricle as current complications following acute myocardial infarction: Secondary | ICD-10-CM

## 2019-08-29 DIAGNOSIS — I69320 Aphasia following cerebral infarction: Secondary | ICD-10-CM

## 2019-08-29 DIAGNOSIS — L03115 Cellulitis of right lower limb: Secondary | ICD-10-CM | POA: Diagnosis not present

## 2019-08-29 DIAGNOSIS — R32 Unspecified urinary incontinence: Secondary | ICD-10-CM

## 2019-08-29 DIAGNOSIS — Z89421 Acquired absence of other right toe(s): Secondary | ICD-10-CM

## 2019-08-29 DIAGNOSIS — I5032 Chronic diastolic (congestive) heart failure: Secondary | ICD-10-CM

## 2019-08-29 DIAGNOSIS — N529 Male erectile dysfunction, unspecified: Secondary | ICD-10-CM

## 2019-08-29 DIAGNOSIS — E559 Vitamin D deficiency, unspecified: Secondary | ICD-10-CM

## 2019-08-29 DIAGNOSIS — H548 Legal blindness, as defined in USA: Secondary | ICD-10-CM

## 2019-08-29 DIAGNOSIS — E1140 Type 2 diabetes mellitus with diabetic neuropathy, unspecified: Secondary | ICD-10-CM | POA: Diagnosis not present

## 2019-08-29 DIAGNOSIS — I4821 Permanent atrial fibrillation: Secondary | ICD-10-CM

## 2019-08-29 DIAGNOSIS — E11319 Type 2 diabetes mellitus with unspecified diabetic retinopathy without macular edema: Secondary | ICD-10-CM

## 2019-08-29 DIAGNOSIS — J189 Pneumonia, unspecified organism: Secondary | ICD-10-CM | POA: Diagnosis not present

## 2019-08-29 DIAGNOSIS — I69322 Dysarthria following cerebral infarction: Secondary | ICD-10-CM

## 2019-08-29 DIAGNOSIS — G4733 Obstructive sleep apnea (adult) (pediatric): Secondary | ICD-10-CM | POA: Diagnosis not present

## 2019-08-29 DIAGNOSIS — H9190 Unspecified hearing loss, unspecified ear: Secondary | ICD-10-CM

## 2019-08-29 DIAGNOSIS — L97521 Non-pressure chronic ulcer of other part of left foot limited to breakdown of skin: Secondary | ICD-10-CM | POA: Diagnosis not present

## 2019-08-29 DIAGNOSIS — Z7901 Long term (current) use of anticoagulants: Secondary | ICD-10-CM

## 2019-08-29 DIAGNOSIS — I2510 Atherosclerotic heart disease of native coronary artery without angina pectoris: Secondary | ICD-10-CM

## 2019-08-29 DIAGNOSIS — E1165 Type 2 diabetes mellitus with hyperglycemia: Secondary | ICD-10-CM

## 2019-08-29 DIAGNOSIS — E785 Hyperlipidemia, unspecified: Secondary | ICD-10-CM

## 2019-08-29 DIAGNOSIS — Z9181 History of falling: Secondary | ICD-10-CM

## 2019-08-29 DIAGNOSIS — Z89412 Acquired absence of left great toe: Secondary | ICD-10-CM

## 2019-08-29 DIAGNOSIS — J440 Chronic obstructive pulmonary disease with acute lower respiratory infection: Secondary | ICD-10-CM | POA: Diagnosis not present

## 2019-08-29 DIAGNOSIS — Z87891 Personal history of nicotine dependence: Secondary | ICD-10-CM

## 2019-08-29 DIAGNOSIS — D519 Vitamin B12 deficiency anemia, unspecified: Secondary | ICD-10-CM

## 2019-08-29 DIAGNOSIS — Z7984 Long term (current) use of oral hypoglycemic drugs: Secondary | ICD-10-CM

## 2019-08-29 DIAGNOSIS — I110 Hypertensive heart disease with heart failure: Secondary | ICD-10-CM | POA: Diagnosis not present

## 2019-08-29 DIAGNOSIS — E11621 Type 2 diabetes mellitus with foot ulcer: Secondary | ICD-10-CM | POA: Diagnosis not present

## 2019-08-29 DIAGNOSIS — Z6835 Body mass index (BMI) 35.0-35.9, adult: Secondary | ICD-10-CM

## 2019-08-29 LAB — BASIC METABOLIC PANEL
Anion gap: 11 (ref 5–15)
BUN: 45 mg/dL — ABNORMAL HIGH (ref 8–23)
CO2: 23 mmol/L (ref 22–32)
Calcium: 9.1 mg/dL (ref 8.9–10.3)
Chloride: 100 mmol/L (ref 98–111)
Creatinine, Ser: 1.75 mg/dL — ABNORMAL HIGH (ref 0.61–1.24)
GFR calc Af Amer: 42 mL/min — ABNORMAL LOW (ref >60)
GFR calc non Af Amer: 36 mL/min — ABNORMAL LOW (ref >60)
Glucose, Bld: 145 mg/dL — ABNORMAL HIGH (ref 70–99)
Potassium: 4.2 mmol/L (ref 3.5–5.1)
Sodium: 134 mmol/L — ABNORMAL LOW (ref 135–145)

## 2019-08-29 LAB — MAGNESIUM: Magnesium: 2 mg/dL (ref 1.7–2.4)

## 2019-08-29 LAB — CBC
HCT: 30 % — ABNORMAL LOW (ref 39.0–52.0)
Hemoglobin: 9.5 g/dL — ABNORMAL LOW (ref 13.0–17.0)
MCH: 29.8 pg (ref 26.0–34.0)
MCHC: 31.7 g/dL (ref 30.0–36.0)
MCV: 94 fL (ref 80.0–100.0)
Platelets: 138 10*3/uL — ABNORMAL LOW (ref 150–400)
RBC: 3.19 MIL/uL — ABNORMAL LOW (ref 4.22–5.81)
RDW: 16.3 % — ABNORMAL HIGH (ref 11.5–15.5)
WBC: 7.5 10*3/uL (ref 4.0–10.5)
nRBC: 0 % (ref 0.0–0.2)

## 2019-08-29 LAB — GLUCOSE, CAPILLARY
Glucose-Capillary: 118 mg/dL — ABNORMAL HIGH (ref 70–99)
Glucose-Capillary: 134 mg/dL — ABNORMAL HIGH (ref 70–99)
Glucose-Capillary: 137 mg/dL — ABNORMAL HIGH (ref 70–99)
Glucose-Capillary: 158 mg/dL — ABNORMAL HIGH (ref 70–99)
Glucose-Capillary: 166 mg/dL — ABNORMAL HIGH (ref 70–99)
Glucose-Capillary: 167 mg/dL — ABNORMAL HIGH (ref 70–99)

## 2019-08-29 LAB — APTT
aPTT: 77 seconds — ABNORMAL HIGH (ref 24–36)
aPTT: 86 seconds — ABNORMAL HIGH (ref 24–36)

## 2019-08-29 LAB — HEPARIN LEVEL (UNFRACTIONATED): Heparin Unfractionated: 1.14 IU/mL — ABNORMAL HIGH (ref 0.30–0.70)

## 2019-08-29 NOTE — Progress Notes (Signed)
[facility]    NEPHROLOGY PROGRESS NOTE  SUBJECTIVE: Cr continuing to trend down, UOP good.  Transferred from ICU yesterday.     OBJECTIVE:  Vitals:   08/29/19 0904 08/29/19 0950  BP:  115/70  Pulse: 64 70  Resp: 16 17  Temp:  98 F (36.7 C)  SpO2: 96% 98%    Intake/Output Summary (Last 24 hours) at 08/29/2019 1230 Last data filed at 08/29/2019 0845 Gross per 24 hour  Intake 595.72 ml  Output 1375 ml  Net -779.28 ml      General:  Awake, wife at [facility] HEENT: dry MM Neck:  R IJ HD catheter CV:  Heart RRR  Lungs:  Decreased BS bilaterally Abd:  abd SNT/ND with normal BS, obese Extremities:  No LE edema Skin:  No skin rash  MEDICATIONS:  . Chlorhexidine Gluconate Cloth  6 each Topical Daily  . feeding supplement (NEPRO CARB STEADY)  237 mL Oral TID BM  . finasteride  5 mg Oral Daily  . insulin aspart  0-9 Units Subcutaneous Q4H  . mometasone-formoterol  2 puff Inhalation BID  . mupirocin ointment  1 application Nasal BID  . vancomycin variable dose per unstable renal function (pharmacist dosing)   Does not apply See admin instructions       LABS:   CBC Latest Ref Rng & Units 08/29/2019 08/27/2019 08/26/2019  WBC 4.0 - 10.5 K/uL 7.5 8.7 9.6  Hemoglobin 13.0 - 17.0 g/dL 9.5(L) 9.1(L) 9.4(L)  Hematocrit 39.0 - 52.0 % 30.0(L) 29.4(L) 29.8(L)  Platelets 150 - 400 K/uL 138(L) 156 179    CMP Latest Ref Rng & Units 08/29/2019 08/28/2019 08/27/2019  Glucose 70 - 99 mg/dL 145(H) 143(H) 123(H)  BUN 8 - 23 mg/dL 45(H) 49(H) 42(H)  Creatinine 0.61 - 1.24 mg/dL 1.75(H) 2.34(H) 2.22(H)  Sodium 135 - 145 mmol/L 134(L) 134(L) 134(L)  Potassium 3.5 - 5.1 mmol/L 4.2 4.4 4.3  Chloride 98 - 111 mmol/L 100 100 101  CO2 22 - 32 mmol/L 23 25 27   Calcium 8.9 - 10.3 mg/dL 9.1 9.0 8.7(L)  Total Protein 6.5 - 8.1 g/dL - - -  Total Bilirubin 0.3 - 1.2 mg/dL - - -  Alkaline Phos 38 - 126 U/L - - -  AST 15 - 41 U/L - - -  ALT 0 - 44 U/L - - -    Lab Results  Component  Value Date   CALCIUM 9.1 08/29/2019   CAION 1.12 (L) 08/23/2019   PHOS 2.6 08/26/2019       Component Value Date/Time   COLORURINE AMBER (A) 08/23/2019 2231   APPEARANCEUR HAZY (A) 08/23/2019 2231   LABSPEC 1.017 08/23/2019 2231   PHURINE 5.0 08/23/2019 2231   GLUCOSEU NEGATIVE 08/23/2019 2231   GLUCOSEU NEGATIVE 05/25/2019 1353   HGBUR MODERATE (A) 08/23/2019 2231   BILIRUBINUR NEGATIVE 08/23/2019 2231   KETONESUR NEGATIVE 08/23/2019 2231   PROTEINUR 100 (A) 08/23/2019 2231   UROBILINOGEN 1.0 05/25/2019 1353   NITRITE NEGATIVE 08/23/2019 2231   LEUKOCYTESUR SMALL (A) 08/23/2019 2231      Component Value Date/Time   PHART 7.511 (H) 08/23/2019 2359   PCO2ART 38.7 08/23/2019 2359   PO2ART 32.0 (LL) 08/23/2019 2359   HCO3 30.9 (H) 08/23/2019 2359   TCO2 32 08/23/2019 2359   ACIDBASEDEF 2.0 12/26/2015 1327   O2SAT 67.0 08/23/2019 2359       Component Value Date/Time   IRON 47 09/12/2014 1136   IRONPCTSAT 15.1 (L) 09/12/2014 1136  ASSESSMENT/PLAN:      Acute kidney injury.    Baseline creatinine 1.  Urine appears to have blood protein and WBCs.  Urine culture no growth [place] without obstruction but nodular prostate noted.    Continue foley.  Avoid nephrotoxins.  Avoid ACE inhibitors ARB's.  Avoid contrast.  Status post CRRT.  Urine output improved, Cr improved, will d/c HD catheter.  Expect Cr to trend to baseline.  If there is residual CKD, can f/u in our office but otherwise may not need f/u.  Will sign off.  Please let us know if we can be of assistance.  .    Hematuria- s/p urology evaluation, appreciate.  Flush catheter q shift  MRSA UTI with shock.  Improved, now off of pressors.  Echocardiogram 9/11 with moderate, fixed thrombus on apical wall of the left ventricle, ejection fraction of 45 to 50%, and RV volume overload.  Respiratory failure: off BiPAP.    Improving oxygenation.  Volume overload appears to be improving.    Atrial fibrillation continues on  anticoagulation.  Anemia does not appear to be an issue at this particular time.  Continue to monitor  Diabetes mellitus per primary team  Coronary artery disease per primary team  MRSA UTI.    Hyperkalemia.  Improved.  Madelon Lips MD

## 2019-08-29 NOTE — Consult Note (Addendum)
Cardiology Consultation:   Patient ID: Charles [name] MRN: [id number]; DOB: 02/25/1941  Admit date: 08/23/2019 Date of Consult: 08/29/2019  Primary Care Provider: Cassandria Anger, MD Primary Cardiologist: Charles Klein, MD  Primary Electrophysiologist:  None    Patient Profile:   Charles [name] is a 78 y.o. male with a hx of CAD (MI /2000 with PTCA of pLAD, failed PCI of RCA 2013 and failed balloon angioplasty of RCA in 2017, now managed medically), chronic diastolic heart failure, DM, OSA  on CPAP, COPD, hx of CVA, Afib pt has refused anticoagulation, hyperlipidemia, hard of hearing, legally blind, chronic leg edema, and venous stasis who is being seen today for the evaluation of thrombus seen on echo at the request of Charles [name].  History of Present Illness:   Charles [name] was last seen by our service in 2018 in the setting of a NSTEMI. He has a history of failed PCI of his RCA in 2013 - shepherd's hook in RCA. He underwent repeat angiography on 06/01/17 and found to have severe multivessel CAD. CTS was consulted and ultimately turned down for surgical revascularization. His CAD has been medically managed with ASA, plavix, BB, imdur, ranexa, and statin. He previously followed with Charles [name], but has not been seen in cardiology clinic since his heart cath. He does not wish to see Charles [name] in the future.  He was admitted and discharged 3 weeks ago after inadvertently taking an overdose of his diuretic. At that time, he was also started on eliquis for afib. He was discharged to rehab and eventually discharged home. Per the wife, he has been gradually worsening since being home with progressive dyspnea and lower extremity swelling. He was seen by his PCP 08/21/19 and seemed to be doing well. Demadex was increased to 100 mg daily  He returned to [facility] on 08/23/19 with SOB, lethargy, weakness, and increased swelling. He was hypoxia and febrile. Fluid bolus did not improve his hypotension and he was  started on pressors. UA positive for UTI. CXR with right pleural effusion vs infiltrate. Acute on chronic renal insufficiency and metabolic acidosis with initial lactic acid of 3.1. Critical care discussed prognosis with wife who agreed to temporary HD and DNR. He was admitted for acute hypoxic respiratory failure with anasarca, acute renal failure, and decompensated heart failure. Pressors were weaned and CRRT was started. Urinary retention and gross hematuria with foley in place, urology was consulted and anticoagulation temporarily held. He has bee anemic with Hb 9. He has left hip and abdomen cellulitis. CT with chronic left humerus non-displaced fracture nor with increased displacement. Ortho recommends sling. Echocardiogram on 08/24/19 was obtained and revealed left ventricular apical thrombus. Heparin drip was started and cardiology was consulted.   sCr has trended down to 1.75 (2.34) from a baseline of 1.0. Nephrology signed off today. He remains anemic with Hb 9.5, may be due to ongoing hematuria.   Of note, home medications included eliquis and ASA, lopressor 25 mg BID, imdur, ranexa, and torsemide. He developed a rash on xarelto previously.  On my interview, he is a poor historian and tells me about his previous hospitalization. He denies chest pain but can't tell me why he came to the hospital. He states he still has trouble breathing when he sleeps at night.    Heart Pathway Score:     Past Medical History:  Diagnosis Date  . Arthritis   . B12 deficiency   . CAD (coronary artery disease)  a. Approx. 2000 - MI. Cath: single vsl dz, PTCA dLAD/medical rx;  b. NSTEMI 11/13 => IVUS attempted for RCA but not successful; anatomy felt stable from 2000 => med Rx. c. [place] 08/2016: severe stable diffuse dLAD, severe prox RCA -> PCI of RCA unsuccessful; d. 05/2017 NSTEMI/Cath: LM nl, LAD 20ost/p, 40m, 80d, LCX 30ost, 20p, 51m, OM2 90, RCA 80p, 99d.  . Cancer ([place])    skin cancer- head   . Cataracts    . Cholelithiasis   . Chronic diastolic CHF (congestive heart failure) ([place])    a. 05/2017 Echo: EF 60-65%, Gr2 DD, mildly dil Ao, triv MR, mildly to mod dil LA, mild PR.  . Claustrophobia   . COPD (chronic obstructive pulmonary disease) ([place])   . CVA (cerebral vascular accident) ([place]) ~ 2001   vision inparted from stroke.  Visual Memory loss  . Diabetic neuropathy ([place])   . Diverticulitis   . Dysrhythmia    if he does not take Metoprolol- Afib  . ED (erectile dysfunction)   . Essential hypertension   . History of MRSA infection    Right foot  . History of stomach ulcers   . HOH (hard of hearing)   . Hypercholesterolemia   . Legally blind   . Lower extremity edema   . Morbid obesity ([place])   . Neuropathy   . NSTEMI (non-ST elevated myocardial infarction) ([place]) 05/2017  . OSA on CPAP   . PAF (paroxysmal atrial fibrillation) ([name])    a. confirmed by event monitor. b. 02/2014 rash on Coumadin, patient decided to discontinue Xarelto due to possible rash, cost and lawyers ads on TV, agreed to take Plavix.  . Pneumonia 2016  . Tunnel vision    Since stroke  . Type II diabetes mellitus ([place])   . Venous stasis   . Weakness 01/11/2017    Past Surgical History:  Procedure Laterality Date  . CARDIAC CATHETERIZATION  1990's  . CARDIAC CATHETERIZATION N/A 08/17/2016   Procedure: Left Heart Cath and Coronary Angiography;  Surgeon: Peter M Martinique, MD;  Location: [place] [facility];  Service: Cardiovascular;  Laterality: N/A;  . CARDIAC CATHETERIZATION N/A 08/19/2016   Procedure: Coronary Balloon Angioplasty;  Surgeon: Peter M Martinique, MD;  Location: [place] [facility];  Service: Cardiovascular;  Laterality: N/A;  . CATARACT EXTRACTION  10/2016  . CEREBRAL ANGIOGRAM  ~ 2000  . COLONOSCOPY    . LEFT HEART CATH AND CORONARY ANGIOGRAPHY N/A 06/01/2017   Procedure: Left Heart Cath and Coronary Angiography;  Surgeon: Burnell Blanks, MD;  Location: [place] [facility];  Service:  Cardiovascular;  Laterality: N/A;  . LEFT HEART CATHETERIZATION WITH CORONARY ANGIOGRAM N/A 10/27/2012   Procedure: LEFT HEART CATHETERIZATION WITH CORONARY ANGIOGRAM;  Surgeon: Burnell Blanks, MD;  Location: [facility];  Service: Cardiovascular;  Laterality: N/A;  . TOE AMPUTATION  2006; 2009   "Dr. Blenda Mounts; big toe left foot; little toe on right foot" (10/26/2012)  . TOE AMPUTATION Right 12/24/2015   2nd & 3 toes/notes 1/13//2017  . TOE AMPUTATION Right    5TH TOE  . 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 Authorizing Provider  acetaminophen (TYLENOL) 500 MG tablet Take 1,000 mg by mouth at bedtime.    Yes [provider]  albuterol (VENTOLIN HFA) 108 (90 Base) MCG/ACT inhaler Inhale 1-2 puffs into the lungs every 6 (six) hours as needed for wheezing or shortness of breath.   Yes [provider]  apixaban (ELIQUIS) 5 MG TABS tablet Take 1 tablet (5 mg total) by mouth 2 (two) times daily. 08/08/19  Yes Mercy Riding, MD  aspirin EC 81 MG tablet Take 81 mg by mouth daily.   Yes [provider]  Cholecalciferol (VITAMIN D-3) 25 MCG (1000 UT) CAPS Take 1,000 Units by mouth daily.   Yes [provider]  Fluticasone-Salmeterol (ADVAIR DISKUS) 100-50 MCG/DOSE AEPB Inhale 1 puff into the lungs 2 (two) times daily. 03/02/16  Yes Plotnikov, Evie Lacks, MD  gabapentin (NEURONTIN) 300 MG capsule TAKE 1 CAPSULE THREE TIMES DAILY Patient taking differently: Take 300 mg by mouth 2 (two) times daily.  08/04/18  Yes Plotnikov, Evie Lacks, MD  insulin aspart (NOVOLOG) 100 UNIT/ML FlexPen Inject 20 Units into the skin 4 (four) times daily - after meals and at bedtime. Follow SS Patient taking differently: Inject 1-5 Units into the skin See  admin instructions. Inject 1-5 units into the skin three to four times a day, per sliding scale 08/23/18  Yes Plotnikov, Evie Lacks, MD  isosorbide mononitrate (IMDUR) 60 MG 24 hr tablet TAKE 1 TABLET EVERY DAY Patient taking differently: Take 60 mg by mouth daily.  05/08/19  Yes Plotnikov, Evie Lacks, MD  magnesium oxide (MAG-OX) 400 (241.3 Mg) MG tablet Take 1 tablet (400 mg total) by mouth 2 (two) times daily. 08/09/19  Yes Mercy Riding, MD  metFORMIN (GLUCOPHAGE) 500 MG tablet TAKE 1 TABLET TWICE DAILY WITH A MEAL Patient taking differently: Take 500 mg by mouth 2 (two) times daily with a meal.  05/08/19  Yes Plotnikov, Evie Lacks, MD  metoprolol tartrate (LOPRESSOR) 25 MG tablet Take 1 tablet (25 mg total) by mouth 2 (two) times daily. 08/04/18  Yes Plotnikov, Evie Lacks, MD  mupirocin ointment (BACTROBAN) 2 % Apply 1 application topically See admin instructions. Apply to affected areas of bilateral feet, ankles, and legs once daily    Yes [provider]  nitroGLYCERIN (NITROSTAT) 0.4 MG SL tablet DISSOLVE 1 TABLET (0.4 MG TOTAL) UNDER THE TONGUE EVERY 5 (FIVE) MINUTES AS NEEDED (UP TO 3 DOSES) FOR CHEST PAIN Patient taking differently: Place 0.4 mg under the tongue every 5 (five) minutes as needed for chest pain.  05/08/19  Yes Plotnikov, Evie Lacks, MD  pravastatin (PRAVACHOL) 40 MG tablet TAKE 1 TABLET EVERY DAY Patient taking differently: Take 40 mg by mouth at bedtime.  05/08/19  Yes Plotnikov, Evie Lacks, MD  PRESCRIPTION MEDICATION See admin instructions. CPAP- At bedtime   Yes [provider]  ranolazine (RANEXA) 500 MG 12 hr tablet TAKE 1 TABLET TWICE DAILY Patient taking differently: Take 500 mg by mouth 2 (two) times daily.  08/03/18  Yes Plotnikov, Evie Lacks, MD  torsemide (DEMADEX) 20 MG tablet Take 40 mg by mouth See admin instructions. Take 40 mg by mouth two times a day and additional 20 mg once daily until desired amount of output of urine is achieved   Yes [provider]  triamcinolone ointment (KENALOG) 0.5 % Apply 1 application topically 2 (two) times daily. Patient taking differently: Apply 1 application topically 2 (two) times daily as needed (for itching).  02/21/19 02/21/20 Yes Plotnikov, Evie Lacks, MD  vitamin B-12 (CYANOCOBALAMIN) 1000 MCG tablet Take 1,000 mcg by mouth daily.    Yes [provider]  collagenase (SANTYL) ointment Apply 1 application topically daily. 11/29/16   Trula Slade, DPM  doxycycline (VIBRA-TABS) 100 MG tablet Take 1 tablet (100 mg total) by mouth every 12 (twelve) hours. Patient not taking: Reported on 08/23/2019 08/08/19   Mercy Riding, MD  torsemide (DEMADEX) 100 MG tablet Take 1 tablet (100 mg total) by mouth daily. 08/21/19   Plotnikov, Evie Lacks, MD    Inpatient Medications: Scheduled Meds: . Chlorhexidine Gluconate Cloth  6 each Topical Daily  . feeding supplement (NEPRO CARB STEADY)  237 mL Oral TID BM  . finasteride  5 mg Oral Daily  . insulin aspart  0-9 Units Subcutaneous Q4H  . mometasone-formoterol  2 puff Inhalation BID  . mupirocin ointment  1 application Nasal BID   Continuous Infusions: . sodium chloride 500 mL/hr at 08/28/19 0946  . sodium chloride    . heparin 1,250 Units/hr (08/29/19 0806)  . vancomycin 1,250 mg (08/28/19 2248)   PRN Meds: acetaminophen, albuterol, ondansetron **OR** ondansetron (ZOFRAN) IV, oxyCODONE  Allergies:    Allergies  Allergen Reactions  . Tolterodine Swelling and Other (See Comments)    (Detrol) Dry mouth/dry throat, severe swelling of the legs, and started a "downward spiral"  . Lipitor [Atorvastatin] Other (See Comments)    Muscle aches  . Xarelto [Rivaroxaban] Rash    Social History:   Social History   Socioeconomic History  . Marital status: Married    Spouse name: Not on file  . Number of children: Not on file  . Years of education: Not on file  . Highest education level: Not on file  Occupational History  . Not on file  Social  Needs  . Financial resource strain: Not on file  . Food insecurity    Worry: Not on file    Inability: Not on file  . Transportation needs    Medical: Not on file    Non-medical: Not on file  Tobacco Use  . Smoking status: Former Smoker    Packs/day: 1.00    Years: 30.00    Pack years: 30.00    Types: Cigarettes, Cigars  . Smokeless tobacco: Former Systems developer    Quit date: 08/15/2016  . Tobacco comment: 12/2016 quit smoking in 1998  Substance and Sexual Activity  . Alcohol use: Yes    Comment: 12/2016   rare   . Drug use: No  . Sexual activity: Yes  Lifestyle  . Physical activity    Days per week: Not on file    Minutes per session: Not on file  . Stress: Not on file  Relationships  . Social Herbalist on phone: Not on file    Gets together: Not on file    Attends religious service: Not on file    Active member of club or organization: Not on file    Attends meetings of clubs or organizations: Not on file    Relationship status: Not on file  . Intimate partner violence    Fear of current or ex partner: Not on file    Emotionally abused: Not on file    Physically abused: Not on file    Forced sexual activity: Not on file  Other Topics Concern  . Not on file  Social History Narrative   Disabled s/p CVA    Family History:    Family History  Problem Relation Age of Onset  . Breast cancer Mother 84  . Heart disease Father 71  . Asthma  Neg Hx      ROS:  Please see the history of present illness.   All other ROS reviewed and negative.     Physical Exam/Data:   Vitals:   08/29/19 0226 08/29/19 0556 08/29/19 0904 08/29/19 0950  BP: 132/71 106/68  115/70  Pulse: 89 67 64 70  Resp: 20 16 16 17   Temp: 98.3 F (36.8 C) 98.2 F (36.8 C)  98 F (36.7 C)  TempSrc: Oral Oral  Oral  SpO2: 96% 98% 96% 98%  Weight:      Height:        Intake/Output Summary (Last 24 hours) at 08/29/2019 1444 Last data filed at 08/29/2019 0845 Gross per 24 hour  Intake 316.45 ml   Output 1250 ml  Net -933.55 ml   Last 3 Weights 08/28/2019 08/27/2019 08/26/2019  Weight (lbs) 286 lb 13.1 oz 282 lb 10.1 oz 285 lb 7.9 oz  Weight (kg) 130.1 kg 128.2 kg 129.5 kg     Body mass index is 34.91 kg/m.  General:  Elderly male, obese, NAD, heard of hearing - unclear if he is confused or can't hear questions during exam HEENT: normal Neck:  Exam difficult - line in place Vascular: bruits difficult Cardiac:  Irregular rhythm, regular rate, do not appreciate a murmur Lungs:  Respirations unlabored, diminished in anterior bases Abd: soft, nontender, no hepatomegaly  Ext: 1+ B LE edema Musculoskeletal:  Partial amputations on both feet Skin: warm and dry  Neuro:  CNs 2-12 intact, no focal abnormalities noted Psych:  Normal affect   EKG:  The EKG was personally reviewed and demonstrates:  Atrial fibrillation with ventricular rate 83, TWI inferior leads (stable from prior),  Anterolateral infarct (appears new) Telemetry:  Telemetry was personally reviewed and demonstrates:  Rate controlled atrial fibrillation in the 60-90s  Relevant CV Studies:  Echo 08/24/19:  1. Mild dyskinesis of the left ventricular, entire apical segment.  2. Apical left ventricular aneurysm.  3. Moderate, fixed thrombus on the apical wall of the left ventricle.  4. The left ventricle has mildly reduced systolic function, with an ejection fraction of 45-50%. The cavity size was normal. Left ventricular diastolic function could not be evaluated secondary to atrial fibrillation. There is right ventricular volume  and pressure overload.  5. The right ventricle has severely reduced systolic function. The cavity was severely enlarged. There is no increase in right ventricular wall thickness. Right ventricular systolic pressure is moderately elevated.  6. Left atrial size was moderately dilated.  7. Right atrial size was severely dilated.  8. Mild mitral valve prolapse, probably due to left heart "underfilling".   9. The mitral valve is grossly normal. Mild thickening of the mitral valve leaflet. There is mild to moderate mitral annular calcification present. 10. The tricuspid valve is grossly normal. Tricuspid valve regurgitation is moderate-severe. 11. Mild thickening of the aortic valve. Sclerosis without any evidence of stenosis of the aortic valve. 12. The aorta is normal unless otherwise noted. 13. The inferior vena cava was dilated in size with <50% respiratory variability. 14. Evidence of right to left atrial level shunting detected by color flow Doppler. 15. When compared to the prior study: from 2018, there are multiple changes. The most striking abnormality is severe dilation and severe dysfunction of the right ventricle, with evidence of pressure and volume overload.     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 Although not previously described, there are findings that  suggest this is a more chronic abnormality.     There is probably a small "stretch" patent foramen ovale with intermittent high velocity, but low volume right to left atrial shunting.   Left heart cath 06/01/17:  Mid LAD lesion, 60 %stenosed.  Dist LAD lesion, 80 %stenosed.  Ost Cx to Prox Cx lesion, 30 %stenosed.  Prox RCA-1 lesion, 80 %stenosed.  Prox RCA-2 lesion, 99 %stenosed.  Mid RCA to Dist RCA lesion, 20 %stenosed.  Ost 2nd Mrg to 2nd Mrg lesion, 90 %stenosed.  Prox Cx to Mid Cx lesion, 20 %stenosed.  Mid Cx lesion, 60 %stenosed.  Ost LAD to Prox LAD lesion, 20 %stenosed.   1. Severe triple vessel CAD 2. The LAD is a heavily calcified vessel that courses to the apex. The proximal vessel has diffuse severe calcification. The mid vessel has a focal moderate stenosis. The distal vessel has high grade diffuse stenosis, unchanged from last cath.  3. The Circumflex is heavily calcified vessel with mild proximal and mid stenosis. The second OM  branch is moderate in caliber with severe proximal calcified stenosis. The distal Circumflex has moderate disease.  4. The large, dominant RCA has a complex, heavily calcified severe stenosis in the proximal segment before and after a Shepherd's crook (bend in the vessel).   Recommendations: Our team has attempted PCI of the complex proximal RCA lesion in September 2017 but was unsuccessful. Dr. Martinique was not able to cross the lesion with a balloon. Atherectomy is not an option given the tortuosity of the vessel and danger of perforation. I have reviewed the films today with the rep from [facility] atherectomy and Rotablator atherectomy and this lesion is not felt to be favorable for either device. I will have our team consult CT surgery tomorrow to discuss CABG but he seems to be an overall poor candidate for CABG given his morbid obesity, sedentary lifestyle, chronic venous insufficiency, COPD and prior CVA. Restart heparin drip 8 hours post sheath pull. Plavix has been on hold since admission.    Diagnostic Dominance: Right       Laboratory Data:  High Sensitivity Troponin:   Recent Labs  Lab 08/23/19 2245 08/24/19 0329  TROPONINIHS 45* 50*     Chemistry Recent Labs  Lab 08/27/19 0452 08/28/19 0415 08/29/19 0534  NA 134* 134* 134*  K 4.3 4.4 4.2  CL 101 100 100  CO2 27 25 23   GLUCOSE 123* 143* 145*  BUN 42* 49* 45*  CREATININE 2.22* 2.34* 1.75*  CALCIUM 8.7* 9.0 9.1  GFRNONAA 27* 26* 36*  GFRAA 32* 30* 42*  ANIONGAP 6 9 11     Recent Labs  Lab 08/23/19 1655 08/24/19 1025 08/25/19 0343 08/25/19 1730 08/26/19 0413  PROT 6.4* 6.3*  --   --   --   ALBUMIN 3.0* 3.1* 3.0* 2.9* 2.9*  AST 22 29  --   --   --   ALT 14 14  --   --   --   ALKPHOS 55 50  --   --   --   BILITOT 1.0 1.2  --   --   --    Hematology Recent Labs  Lab 08/26/19 0413 08/27/19 0452 08/29/19 0534  WBC 9.6 8.7 7.5  RBC 3.13* 3.04* 3.19*  HGB 9.4* 9.1* 9.5*  HCT 29.8* 29.4* 30.0*  MCV  95.2 96.7 94.0  MCH 30.0 29.9 29.8  MCHC 31.5 31.0 31.7  RDW 16.4* 16.3* 16.3*  PLT 179 156 138*   BNP  Recent Labs  Lab 08/23/19 1650 08/25/19 0343  BNP 1,871.6* 878.3*    DDimer No results for input(s): DDIMER in the last 168 hours.   Radiology/Studies:  Dg Humerus Left  Result Date: 08/27/2019 CLINICAL DATA:  Recent fall. Left humerus pain. EXAM: LEFT HUMERUS - 2+ VIEW COMPARISON:  None. FINDINGS: An impacted fracture of the left humeral neck is seen. No evidence of shoulder dislocation. Generalized osteopenia is demonstrated. Degenerative spurring of acromioclavicular joint and distal acromion process noted. IMPRESSION: Impacted fracture of left humeral neck. Electronically Signed   By: Marlaine Hind M.D.   On: 08/27/2019 17:17    Assessment and Plan:   1. 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 Acute on chronic diastolic heart failure with mildly reduced EF of 45-50% - right ventricle dilated - transition BB to metoprolol succinate - continue imdur - if pressure tolerates, may add hydralazine - will avoid ACEI/ARB given renal function - fluid status initially managed with CRRT, now off - he will need diuresis once his renal function is near baseline - recommend repeating CXR for right effusion - will need to resume home diuretic once renal function has normalized   4. CAD with multivessel disease managed medically, RCA, LAD, and OM2 - previously turned down for CT surgery - mildly elevated troponin, as above, likely demand ischemia - do not suspect an ACS process, although his lesion in the  distal LAD is concordant with his apical aneurysm/thrombus - recommend restarting plavix, dropping ASA in the setting of AC   5. Atrial fibrillation - suspect this is now permanent - rate controlled - was started on eliquis approximately three weeks ago - This patients CHA2DS2-VASc Score and unadjusted Ischemic Stroke Rate (% per year) is equal to 10.8 % stroke rate/year from a score of 8 (CHF, HTN, MI, DM, age, stroke) - agree with long term anticoagulation      For questions or updates, please contact [facility] Please consult [email or web address] for contact info under     Signed, Ledora Bottcher, PA  08/29/2019 2:44 PM   I have seen and examined the patient along with Ledora Bottcher, PA .  I have reviewed the chart, notes and new data.  I agree with PA/NP's note.  Key new complaints: very difficult, complex patient with multiple serious illnesses. He is recovering from shock (septic + cardiogenic?) with acute respiratory and renal failure. It is difficult to communicate with him (very [place], legally blind). No acute CV complaints. Key examination changes: severe obesity, which limits his exam, but appears to be mildly hypervolemic. Key new findings / data: echo shows apical aneurysm with LV thrombus, but only mildly decreased LVEF. Most prominent abnormalities are right heart related with severe chamber enlargement and RV dysfunction and evidence of markedly increased R heart filling pressures.  My suspicion is that the apical LV infarction is not acute. Suspect there has been interval occlusion of the severely diseased LAD at some point in thee last 2 years. RV dysfunction is probably due to COPD, obesity and OSA.  PLAN: Continue to monitor renal  function with diuresis as allowed by renal function progression - seems to be steadily improving. Warfarin preferable to DOAC due to renal dysfunction and for LV thrombus. Probably safer to use clopidogrel + warfarin, rather than ASA.  No plan for invasive cardiac evaluation, he is a poor candidate for either surgical or percutaneous revascularization,..  Charles Klein, MD, [facility] [phone] 08/29/2019, 5:12 PM

## 2019-08-29 NOTE — Progress Notes (Signed)
ANTICOAGULATION CONSULT NOTE   Pharmacy Consult for Heparin Indication: atrial fibrillation and LV Thrombus  Allergies  Allergen Reactions  . Tolterodine Swelling and Other (See Comments)    (Detrol) Dry mouth/dry throat, severe swelling of the legs, and started a "downward spiral"  . Lipitor [Atorvastatin] Other (See Comments)    Muscle aches  . Xarelto [Rivaroxaban] Rash    Patient Measurements: Height: 6\' 4"  (193 cm) Weight: 286 lb 13.1 oz (130.1 kg) IBW/kg (Calculated) : 86.8 Heparin Dosing Weight: 115 kg  Vital Signs: Temp: 98 F (36.7 C) (09/16 0950) Temp Source: Oral (09/16 0950) BP: 115/70 (09/16 0950) Pulse Rate: 70 (09/16 0950)  Labs: Recent Labs    08/27/19 0452 08/28/19 0415 08/28/19 2056 08/29/19 0534  HGB 9.1*  --   --  9.5*  HCT 29.4*  --   --  30.0*  PLT 156  --   --  138*  APTT 39*  --   --  77*  HEPARINUNFRC >2.20*  --  1.46* 1.14*  CREATININE 2.22* 2.34*  --  1.75*    Estimated Creatinine Clearance: 51.2 mL/min (A) (by C-G formula based on SCr of 1.75 mg/dL (H)).  Assessment: 78 y.o. male with h/o Afib, Eliquis PTA on hold given AKI on admission.  Pharmacy asked to initiate heparin.  Heparin use has been complicated by hematuria related to foley placement.  ECHO 9/15 revealed an LV thrombus.  Last dose of Eliquis was 9/10.  Would expect this to be cleared, however given AKI, lab results support patient may still have some Eliquis in his system that is influencing the accuracy of anti-Xa level monitoring of heparin.  Currently on heparin at 1250 units/hr with therapeutic aPTT.  Goal of Therapy:  aPTT 66-102 Heparin level 0.3-0.7 units/ml Monitor platelets by anticoagulation protocol: Yes   Plan:  Continue heparin at 1250 units/hr. Check aPTT this afternoon for confirmation of dosing. Continue to check daily heparin level, aPTT, and CBC. Will dose based on aPTTs until levels correlating.  [employer], Pharm.D., BCPS Clinical  Pharmacist Clinical phone for 08/29/2019 from 8:30-4:00 is [phone].  **Pharmacist phone directory can now be found on [email or web address] ([email or web address]).  Listed under [place].  08/29/2019 12:47 PM

## 2019-08-29 NOTE — Progress Notes (Signed)
PROGRESS NOTE        PATIENT DETAILS Name: DEONTAI VITALE Age: 78 y.o. Sex: male Date of Birth: 01/11/1941 Admit Date: 08/23/2019 Admitting Physician Collene Gobble, MD [id number], Evie Lacks, MD  Brief Narrative: Patient is a 78 y.o. male history of CAD, chronic diastolic heart failure, COPD, atrial fibrillation, history of CVA-who presented to the hospital for worsening shortness of breath, increasing lower extremity edema-patient was found to have acute hypoxemic respiratory failure in the setting of decompensated combined systolic/diastolic heart failure, acute renal failure.  Managed in the intensive care unit-required CRRT-upon further stability-transferred to the hospitalist service on 9/15.  Subjective: Denies any chest pain or shortness of breath.  Assessment/Plan: Acute kidney injury: Required CRRT-but rapidly improving.  No further recommendations from nephrology.  Acute hypoxemic respiratory failure: Required BiPAP in the ICU-probably secondary to decompensated combined systolic and diastolic heart failure.  Volume status rapidly improving.  Acute on chronic systolic and diastolic heart failure (EF 45-50%) along with right ventricular failure: Volume status is improved-have consulted cardiology today.  LV thrombus: Remains on IV heparin-have consulted cardiology today  PAF: On Eliquis prior to this hospital stay-currently on IV heparin-given LV thrombus-may need to be started on Coumadin.  Await cardiology opinion.  Hematuria: Improved-urology following.  Cellulitis of the left hip/abdomen along with MRSA UTI: Afebrile-improved-continue IV vancomycin-blood cultures negative  DM-2: CBGs stable-continue with SSI  Chronic fracture of the left humeral head: Orthopedics following-recommendations are for supportive care.  Debility/deconditioning: PT/OT eval  S/p right TMA   Obesity: Estimated body mass index is 34.91 kg/m as calculated from  the following:   Height as of this encounter: 6\' 4"  (1.93 m).   Weight as of this encounter: 130.1 kg.    Diet: Diet Order            Diet Heart Room service appropriate? Yes; Fluid consistency: Thin  Diet effective now               DVT Prophylaxis: Full dose anticoagulation with Heparin  Code Status: Partial Code: DNI-no CPR-OK for meds  Family Communication: Spoke with spouse over phone  Disposition Plan: Remain inpatient-but will plan on Home health vs SNF on discharge  Antimicrobial agents: Anti-infectives (From admission, onward)   Start     Dose/Rate Route Frequency Ordered Stop   08/28/19 2200  vancomycin (VANCOCIN) 1,250 mg in sodium chloride 0.9 % 250 mL IVPB     1,250 mg 166.7 mL/hr over 90 Minutes Intravenous Every 24 hours 08/28/19 2144     08/27/19 2100  ceFEPIme (MAXIPIME) 1 g in sodium chloride 0.9 % 100 mL IVPB  Status:  Discontinued     1 g 200 mL/hr over 30 Minutes Intravenous Every 24 hours 08/27/19 0729 08/27/19 1022   08/27/19 1800  vancomycin (VANCOCIN) 1,250 mg in sodium chloride 0.9 % 250 mL IVPB     1,250 mg 166.7 mL/hr over 90 Minutes Intravenous  Once 08/27/19 1750 08/27/19 1951   08/27/19 0726  vancomycin variable dose per unstable renal function (pharmacist dosing)  Status:  Discontinued      Does not apply See admin instructions 08/27/19 0726 08/29/19 1249   08/25/19 1600  vancomycin (VANCOCIN) 1,250 mg in sodium chloride 0.9 % 250 mL IVPB  Status:  Discontinued     1,250 mg 166.7 mL/hr over 90 Minutes Intravenous Every 24  hours 08/24/19 1803 08/27/19 0726   08/24/19 1815  ceFEPIme (MAXIPIME) 2 g in sodium chloride 0.9 % 100 mL IVPB  Status:  Discontinued     2 g 200 mL/hr over 30 Minutes Intravenous Every 12 hours 08/24/19 1803 08/27/19 0729   08/24/19 1545  ceFEPIme (MAXIPIME) 500 mg in dextrose 5 % 50 mL IVPB  Status:  Discontinued     500 mg 100 mL/hr over 30 Minutes Intravenous Every 24 hours 08/24/19 1537 08/24/19 1803   08/24/19  1545  vancomycin (VANCOCIN) 2,000 mg in sodium chloride 0.9 % 500 mL IVPB     2,000 mg 250 mL/hr over 120 Minutes Intravenous  Once 08/24/19 1537 08/24/19 1848   08/24/19 1537  vancomycin variable dose per unstable renal function (pharmacist dosing)  Status:  Discontinued      Does not apply See admin instructions 08/24/19 1537 08/24/19 1803   08/23/19 1730  cefTRIAXone (ROCEPHIN) 2 g in sodium chloride 0.9 % 100 mL IVPB  Status:  Discontinued     2 g 200 mL/hr over 30 Minutes Intravenous Every 24 hours 08/23/19 1721 08/24/19 1526   08/23/19 1730  doxycycline (VIBRAMYCIN) 100 mg in sodium chloride 0.9 % 250 mL IVPB     100 mg 125 mL/hr over 120 Minutes Intravenous  Once 08/23/19 1721 08/23/19 1956      Procedures:   CONSULTS:  cardiology, pulmonary/intensive care, nephrology and urology  Time spent: 25 minutes-Greater than 50% of this time was spent in counseling, explanation of diagnosis, planning of further management, and coordination of care.  MEDICATIONS: Scheduled Meds:  Chlorhexidine Gluconate Cloth  6 each Topical Daily   feeding supplement (NEPRO CARB STEADY)  237 mL Oral TID BM   finasteride  5 mg Oral Daily   insulin aspart  0-9 Units Subcutaneous Q4H   mometasone-formoterol  2 puff Inhalation BID   mupirocin ointment  1 application Nasal BID   Continuous Infusions:  sodium chloride 500 mL/hr at 08/28/19 0946   sodium chloride     heparin 1,250 Units/hr (08/29/19 0806)   vancomycin 1,250 mg (08/28/19 2248)   PRN Meds:.acetaminophen, albuterol, ondansetron **OR** ondansetron (ZOFRAN) IV, oxyCODONE   PHYSICAL EXAM: Vital signs: Vitals:   08/29/19 0226 08/29/19 0556 08/29/19 0904 08/29/19 0950  BP: 132/71 106/68  115/70  Pulse: 89 67 64 70  Resp: 20 16 16 17   Temp: 98.3 F (36.8 C) 98.2 F (36.8 C)  98 F (36.7 C)  TempSrc: Oral Oral  Oral  SpO2: 96% 98% 96% 98%  Weight:      Height:       Filed Weights   08/26/19 0207 08/27/19 0457 08/28/19  0200  Weight: 129.5 kg 128.2 kg 130.1 kg   Body mass index is 34.91 kg/m.   Gen Exam:Alert awake-not in any distress.  Chronically sick appearing. HEENT:atraumatic, normocephalic Chest: B/L clear to auscultation anteriorly CVS:S1S2 regular Abdomen:soft non tender, non distended Extremities: Trace edema Neurology: Non focal Skin: no rash  I have personally reviewed following labs and imaging studies  LABORATORY DATA: CBC: Recent Labs  Lab 08/23/19 1655  08/24/19 1025 08/25/19 0343 08/26/19 0413 08/27/19 0452 08/29/19 0534  WBC 12.3*  --  12.4* 9.7 9.6 8.7 7.5  NEUTROABS 11.0*  --  11.0*  --   --   --   --   HGB 10.8*   < > 10.5* 9.8* 9.4* 9.1* 9.5*  HCT 33.4*   < > 33.7* 31.6* 29.8* 29.4* 30.0*  MCV 93.0  --  95.7 95.2 95.2 96.7 94.0  PLT 249  --  237 211 179 156 138*   < > = values in this interval not displayed.    Basic Metabolic Panel: Recent Labs  Lab 08/24/19 1237  08/25/19 0343 08/25/19 1730 08/26/19 0413 08/27/19 0452 08/28/19 0415 08/29/19 0534  NA  --    < > 134* 134* 134* 134* 134* 134*  K  --    < > 4.9 4.4 4.3 4.3 4.4 4.2  CL  --    < > 95* 97* 98 101 100 100  CO2  --    < > 26 27 26 27 25 23   GLUCOSE  --    < > 111* 112* 104* 123* 143* 145*  BUN  --    < > 77* 49* 40* 42* 49* 45*  CREATININE  --    < > 4.01* 2.67* 2.17* 2.22* 2.34* 1.75*  CALCIUM  --    < > 8.5* 8.2* 8.3* 8.7* 9.0 9.1  MG 2.0  --  2.1  --  2.2 2.2 2.1 2.0  PHOS 5.7*  --  4.8* 3.4 2.6  --   --   --    < > = values in this interval not displayed.    GFR: Estimated Creatinine Clearance: 51.2 mL/min (A) (by C-G formula based on SCr of 1.75 mg/dL (H)).  Liver Function Tests: Recent Labs  Lab 08/23/19 1655 08/24/19 1025 08/25/19 0343 08/25/19 1730 08/26/19 0413  AST 22 29  --   --   --   ALT 14 14  --   --   --   ALKPHOS 55 50  --   --   --   BILITOT 1.0 1.2  --   --   --   PROT 6.4* 6.3*  --   --   --   ALBUMIN 3.0* 3.1* 3.0* 2.9* 2.9*   No results for input(s):  LIPASE, AMYLASE in the last 168 hours. No results for input(s): AMMONIA in the last 168 hours.  Coagulation Profile: Recent Labs  Lab 08/23/19 1845  INR 2.3*    Cardiac Enzymes: Recent Labs  Lab 08/24/19 1025  CKTOTAL 61    BNP (last 3 results) No results for input(s): PROBNP in the last 8760 hours.  HbA1C: No results for input(s): HGBA1C in the last 72 hours.  CBG: Recent Labs  Lab 08/28/19 1627 08/28/19 2129 08/29/19 0034 08/29/19 0723 08/29/19 1127  GLUCAP 193* 160* 158* 137* 166*    Lipid Profile: No results for input(s): CHOL, HDL, LDLCALC, TRIG, CHOLHDL, LDLDIRECT in the last 72 hours.  Thyroid Function Tests: No results for input(s): TSH, T4TOTAL, FREET4, T3FREE, THYROIDAB in the last 72 hours.  Anemia Panel: No results for input(s): VITAMINB12, FOLATE, FERRITIN, TIBC, IRON, RETICCTPCT in the last 72 hours.  Urine analysis:    Component Value Date/Time   COLORURINE AMBER (A) 08/23/2019 2231   APPEARANCEUR HAZY (A) 08/23/2019 2231   LABSPEC 1.017 08/23/2019 2231   PHURINE 5.0 08/23/2019 2231   GLUCOSEU NEGATIVE 08/23/2019 2231   GLUCOSEU NEGATIVE 05/25/2019 1353   HGBUR MODERATE (A) 08/23/2019 2231   BILIRUBINUR NEGATIVE 08/23/2019 2231   KETONESUR NEGATIVE 08/23/2019 2231   PROTEINUR 100 (A) 08/23/2019 2231   UROBILINOGEN 1.0 05/25/2019 1353   NITRITE NEGATIVE 08/23/2019 2231   LEUKOCYTESUR SMALL (A) 08/23/2019 2231    Sepsis Labs: Lactic Acid, Venous    Component Value Date/Time   LATICACIDVEN 1.4 08/25/2019 0343    MICROBIOLOGY: Recent  Results (from the past 240 hour(s))  SARS Coronavirus 2 [facility] order, Performed in [facility] hospital lab) Nasopharyngeal Nasopharyngeal Swab     Status: None   Collection Time: 08/23/19  5:16 PM   Specimen: Nasopharyngeal Swab  Result Value Ref Range Status   SARS Coronavirus 2 NEGATIVE NEGATIVE Final    Comment: (NOTE) If result is NEGATIVE SARS-CoV-2 target nucleic acids are NOT  DETECTED. The SARS-CoV-2 RNA is generally detectable in upper and lower  respiratory specimens during the acute phase of infection. The lowest  concentration of SARS-CoV-2 viral copies this assay can detect is 250  copies / mL. A negative result does not preclude SARS-CoV-2 infection  and should not be used as the sole basis for treatment or other  patient management decisions.  A negative result may occur with  improper specimen collection / handling, submission of specimen other  than nasopharyngeal swab, presence of viral mutation(s) within the  areas targeted by this assay, and inadequate number of viral copies  (<250 copies / mL). A negative result must be combined with clinical  observations, patient history, and epidemiological information. If result is POSITIVE SARS-CoV-2 target nucleic acids are DETECTED. The SARS-CoV-2 RNA is generally detectable in upper and lower  respiratory specimens dur ing the acute phase of infection.  Positive  results are indicative of active infection with SARS-CoV-2.  Clinical  correlation with patient history and other diagnostic information is  necessary to determine patient infection status.  Positive results do  not rule out bacterial infection or co-infection with other viruses. If result is PRESUMPTIVE POSTIVE SARS-CoV-2 nucleic acids MAY BE PRESENT.   A presumptive positive result was obtained on the submitted specimen  and confirmed on repeat testing.  While 2019 novel coronavirus  (SARS-CoV-2) nucleic acids may be present in the submitted sample  additional confirmatory testing may be necessary for epidemiological  and / or clinical management purposes  to differentiate between  SARS-CoV-2 and other Sarbecovirus currently known to infect humans.  If clinically indicated additional testing with an alternate test  methodology [phone]) is advised. The SARS-CoV-2 RNA is generally  detectable in upper and lower respiratory sp ecimens during  the acute  phase of infection. The expected result is Negative. Fact Sheet for Patients:  StrictlyIdeas.no Fact Sheet for Healthcare Providers: BankingDealers.co.za This test is not yet approved or cleared by the [place] FDA and has been authorized for detection and/or diagnosis of SARS-CoV-2 by FDA under an Emergency Use Authorization (EUA).  This EUA will remain in effect (meaning this test can be used) for the duration of the COVID-19 declaration under Section 564(b)(1) of the Act, 21 U.S.C. section 360bbb-3(b)(1), unless the authorization is terminated or revoked sooner. Performed at [facility], [place] [address] [place], [place] [address]   Blood Culture (routine x 2)     Status: None   Collection Time: 08/23/19  5:26 PM   Specimen: BLOOD  Result Value Ref Range Status   Specimen Description BLOOD BLOOD LEFT FOREARM  Final   Special Requests   Final    BOTTLES DRAWN AEROBIC AND ANAEROBIC Blood Culture adequate volume   Culture   Final    NO GROWTH 5 DAYS Performed at [facility], [place] [address], [place], [place] [address]    Report Status 08/28/2019 FINAL  Final  Blood Culture (routine x 2)     Status: None   Collection Time: 08/23/19  6:02 PM   Specimen: BLOOD  Result  Value Ref Range Status   Specimen Description BLOOD RIGHT ANTECUBITAL  Final   Special Requests   Final    BOTTLES DRAWN AEROBIC ONLY Blood Culture results may not be optimal due to an inadequate volume of blood received in culture bottles   Culture   Final    NO GROWTH 5 DAYS Performed at [place] [address], [place], [place] [address]    Report Status 08/28/2019 FINAL  Final  Urine culture     Status: Abnormal   Collection Time: 08/23/19  7:21 PM   Specimen: Urine, Random  Result Value Ref Range Status   Specimen Description URINE, RANDOM  Final   Special Requests   Final    NONE Performed at [facility],  [place] [address] [place], [place] [address]    Culture (A)  Final    50,000 COLONIES/mL METHICILLIN RESISTANT STAPHYLOCOCCUS AUREUS   Report Status 08/26/2019 FINAL  Final   Organism ID, Bacteria METHICILLIN RESISTANT STAPHYLOCOCCUS AUREUS (A)  Final      Susceptibility   Methicillin resistant staphylococcus aureus - MIC*    CIPROFLOXACIN >=8 RESISTANT Resistant     GENTAMICIN <=0.5 SENSITIVE Sensitive     NITROFURANTOIN <=16 SENSITIVE Sensitive     OXACILLIN >=4 RESISTANT Resistant     TETRACYCLINE >=16 RESISTANT Resistant     VANCOMYCIN 1 SENSITIVE Sensitive     TRIMETH/SULFA <=10 SENSITIVE Sensitive     CLINDAMYCIN >=8 RESISTANT Resistant     RIFAMPIN <=0.5 SENSITIVE Sensitive     Inducible Clindamycin NEGATIVE Sensitive     * 50,000 COLONIES/mL METHICILLIN RESISTANT STAPHYLOCOCCUS AUREUS  MRSA PCR Screening     Status: Abnormal   Collection Time: 08/24/19  2:14 PM   Specimen: Nasal Mucosa; Nasopharyngeal  Result Value Ref Range Status   MRSA by PCR POSITIVE (A) NEGATIVE Final    Comment:        The GeneXpert MRSA Assay (FDA approved for NASAL specimens only), is one component of a comprehensive MRSA colonization surveillance program. It is not intended to diagnose MRSA infection nor to guide or monitor treatment for MRSA infections. RESULT CALLED TO, READ BACK BY AND VERIFIED WITH: Everlean Alstrom RN 15:40 08/24/19 ([email or web address]) Performed at [facility], [place] [address], [place], [place] [address]   Urine culture     Status: None   Collection Time: 08/24/19  2:39 PM   Specimen: Urine, Catheterized  Result Value Ref Range Status   Specimen Description URINE, CATHETERIZED  Final   Special Requests Normal  Final   Culture   Final    NO GROWTH Performed at [facility], [address] [place], [place] [address]    Report Status 08/25/2019 FINAL  Final    RADIOLOGY STUDIES/RESULTS: Dg Chest 1 View  Result Date: 08/24/2019 CLINICAL DATA:  78 year old male with central  line placement. EXAM: CHEST  1 VIEW COMPARISON:  Chest radiograph dated 06/02 FINDINGS: Right IJ central venous line with tip close to the cavoatrial junction. No pneumothorax. There is a right pleural effusion with right lung base atelectasis. Infiltrate is not excluded. Clinical correlation is recommended. The left lung is clear. Stable cardiomegaly. Atherosclerotic calcification of the aortic arch. No acute osseous pathology. Osteopenia with degenerative changes of the spine. Old left humeral neck fracture. IMPRESSION: 1. Right IJ central venous line with tip close to the cavoatrial junction. No pneumothorax. 2. Right pleural effusion with right lung base atelectasis versus infiltrate. Electronically Signed  By: Anner Crete M.D.   On: 08/24/2019 18:32   US Renal  Result Date: 08/24/2019 CLINICAL DATA:  Acute renal failure EXAM: RENAL / URINARY TRACT ULTRASOUND COMPLETE COMPARISON:  None. FINDINGS: Somewhat limited exam due to overlying bowel gas. Right Kidney: Renal measurements: 13.3 x 6.6 x 6.7 = volume: 316 mL . Echogenicity within normal limits. No mass or hydronephrosis visualized. Left Kidney: Renal measurements: 11.8 x 6.2 x 7.0 = volume: 266 mL. Echogenicity within normal limits. No mass or hydronephrosis visualized. Bladder: The bladder is partially distended there is a heterogeneous nodular appearance to the prostate which protrudes into the posterior bladder. IMPRESSION: Somewhat limited exam due to overlying bowel gas, however normal appearing kidneys. Enlarged nodular prostate protruding into the posterior bladder. Electronically Signed   By: Prudencio Pair M.D.   On: 08/24/2019 02:18   Dg Chest Port 1 View  Result Date: 08/25/2019 CLINICAL DATA:  CHF EXAM: PORTABLE CHEST 1 VIEW COMPARISON:  Yesterday FINDINGS: Shortening of the right IJ catheter, tip near the SVC brachiocephalic confluence. Cardiomegaly with haziness of the right chest from pleural effusion. No superimposed Kerley lines  or air bronchogram. IMPRESSION: 1. Shorter right IJ catheter, tip near the SVC brachiocephalic confluence. 2. Cardiomegaly and right pleural effusion that is unchanged. Electronically Signed   By: Monte Fantasia M.D.   On: 08/25/2019 07:54   Dg Chest Port 1 View  Result Date: 08/23/2019 CLINICAL DATA:  Dyspnea EXAM: PORTABLE CHEST 1 VIEW COMPARISON:  06/01/2017 FINDINGS: Cardiomegaly. Mild pulmonary vascular congestion. Calcific aortic knob. Right basilar opacity. No pneumothorax. Advanced degenerative changes of the shoulders. IMPRESSION: 1. Right basilar opacity which may reflect pleural effusion with atelectasis and/or pneumonia. 2. Cardiomegaly and pulmonary vascular congestion suggesting CHF. Electronically Signed   By: Davina Poke M.D.   On: 08/23/2019 17:12   Dg Humerus Left  Result Date: 08/27/2019 CLINICAL DATA:  Recent fall. Left humerus pain. EXAM: LEFT HUMERUS - 2+ VIEW COMPARISON:  None. FINDINGS: An impacted fracture of the left humeral neck is seen. No evidence of shoulder dislocation. Generalized osteopenia is demonstrated. Degenerative spurring of acromioclavicular joint and distal acromion process noted. IMPRESSION: Impacted fracture of left humeral neck. Electronically Signed   By: Marlaine Hind M.D.   On: 08/27/2019 17:17     LOS: 6 days   Oren Binet, MD  [facility]  If 7PM-7AM, please contact night-coverage  Please page via [email or web address]  Go to [email or web address] and use [facility]'s universal password to access. If you do not have the password, please contact the hospital operator.  Locate the [facility] provider you are looking for under [facility] and page to a number that you can be directly reached. If you still have difficulty reaching the provider, please page the [facility] (Director on Call) for the Hospitalists listed on amion for assistance.  08/29/2019, 2:42 PM

## 2019-08-29 NOTE — Progress Notes (Addendum)
ANTICOAGULATION CONSULT NOTE   Pharmacy Consult for Heparin Indication: atrial fibrillation and LV Thrombus  Allergies  Allergen Reactions  . Tolterodine Swelling and Other (See Comments)    (Detrol) Dry mouth/dry throat, severe swelling of the legs, and started a "downward spiral"  . Lipitor [Atorvastatin] Other (See Comments)    Muscle aches  . Xarelto [Rivaroxaban] Rash    Patient Measurements: Height: 6\' 4"  (193 cm) Weight: 286 lb 13.1 oz (130.1 kg) IBW/kg (Calculated) : 86.8 Heparin Dosing Weight: 115 kg  Vital Signs: Temp: 98 F (36.7 C) (09/16 0950) Temp Source: Oral (09/16 0950) BP: 115/70 (09/16 0950) Pulse Rate: 70 (09/16 0950)  Labs: Recent Labs    08/27/19 0452 08/28/19 0415 08/28/19 2056 08/29/19 0534  HGB 9.1*  --   --  9.5*  HCT 29.4*  --   --  30.0*  PLT 156  --   --  138*  APTT 39*  --   --  77*  HEPARINUNFRC >2.20*  --  1.46* 1.14*  CREATININE 2.22* 2.34*  --  1.75*    Estimated Creatinine Clearance: 51.2 mL/min (A) (by C-G formula based on SCr of 1.75 mg/dL (H)).  Assessment: 78 y.o. male with h/o Afib, Eliquis PTA on hold given AKI on admission.  Pharmacy asked to initiate heparin.  Heparin use has been complicated by hematuria related to foley placement.  ECHO 9/15 revealed an LV thrombus.  Last dose of Eliquis was 9/10.  Would expect this to be cleared, however given AKI, lab results support patient may still have some Eliquis in his system that is influencing the accuracy of anti-Xa level monitoring of heparin.  APTT therapeutic at 86 seconds, anti-Xa level remains uncorrelated   Goal of Therapy:  aPTT 66-102 Heparin level 0.3-0.7 units/ml Monitor platelets by anticoagulation protocol: Yes   Plan:  Continue heparin at 1250 units/hr. Daily aPTT/HL, CBC, s/s bleeding  Charles [name], PharmD Clinical Pharmacist Please check AMION for all [place] numbers 08/29/2019 4:39 PM

## 2019-08-29 NOTE — Consult Note (Signed)
Reason for Consult:L proximal humerus fracture Referring Physician: triad hospitalist  Charles [name] is an 78 y.o. male.  HPI: 78 y.o.malewithhistory of CAD managed medically, chronic diastolic CHF, diabetes mellitus, sleep apnea, COPD, history of CVA, atrial fibrillation who was recently admitted and discharged 3 weeks ago after patient inadvertently took overdose of Lasix.   Patient was observed in the hospital and eventually discharged to rehab on torsemide. At the time patient also was started on apixaban for A. fib. And eventually was discharged back to home. Patient wife states that patient has been doing poorly since discharge from the hospital and has gradually experiencedworsening shortness of breath with increasing lower extremity edema.Two days prior to admission patient had become very short of breath with minimal exertion. Has been eating poorly. Has not had any nausea vomiting or diarrhea. Has not had any fever chills. Did not lose consciousness.  Pt. Was Admitted for acute hypoxic respiratory failure with anasarca, acute renal failure, and heart failure. Pt was found to have an infiltrate on CXR and has had very soft BP since admission. Overnight 9/11 BP dropped to 65/40 ( Checked both arms), pt was bolused with 500 cc's IVF and started on maintenance fluids at 100 cc / hr. As BP will need pressor support, and it appears patient will need higher level of care, PCCM were consulted to admit to ICU and manage care. Patient has had continued hematuria and there has been concern for foley blockage with decreased UO. On 9/14, urology was consulted and the decision was made to place a Coude catheter to circumnavigate the patient's BPH and assist with clot passage.  Nephrology was consulted and the patient has required CRRT. That was stopped on 08/26/2019. Creatinine has stabilized and dialysis is on hold for the current time.   From an orthopaedic standpoint patient with  history in Aug 2019 of L proximal humerus fracture minimally displaced treated nonoperatively followed by Dr. Fredonia [facility].  Pt used a sling for at least 6 weeks per his report today.  He has dealt with the L shoulder fracture for a year now pain increased some over the last month again per his report, feels the arm was pulled on by care providers at "[facility]".  New xrays do show increased displacement of fracture compared to last year unclear of the timing on this.    Past Medical History:  Diagnosis Date  . Arthritis   . B12 deficiency   . CAD (coronary artery disease)    a. Approx. 2000 - MI. Cath: single vsl dz, PTCA dLAD/medical rx;  b. NSTEMI 11/13 => IVUS attempted for RCA but not successful; anatomy felt stable from 2000 => med Rx. c. [place] 08/2016: severe stable diffuse dLAD, severe prox RCA -> PCI of RCA unsuccessful; d. 05/2017 NSTEMI/Cath: LM nl, LAD 20ost/p, 5m, 80d, LCX 30ost, 20p, 53m, OM2 90, RCA 80p, 99d.  . Cancer ([place])    skin cancer- head   . Cataracts   . Cholelithiasis   . Chronic diastolic CHF (congestive heart failure) ([place])    a. 05/2017 Echo: EF 60-65%, Gr2 DD, mildly dil Ao, triv MR, mildly to mod dil LA, mild PR.  . Claustrophobia   . COPD (chronic obstructive pulmonary disease) ([place])   . CVA (cerebral vascular accident) ([place]) ~ 2001   vision inparted from stroke.  Visual Memory loss  . Diabetic neuropathy ([place])   . Diverticulitis   . Dysrhythmia    if he does not take Metoprolol-  Afib  . ED (erectile dysfunction)   . Essential hypertension   . History of MRSA infection    Right foot  . History of stomach ulcers   . HOH (hard of hearing)   . Hypercholesterolemia   . Legally blind   . Lower extremity edema   . Morbid obesity ([place])   . Neuropathy   . NSTEMI (non-ST elevated myocardial infarction) ([place]) 05/2017  . OSA on CPAP   . PAF (paroxysmal atrial fibrillation) ([name])    a. confirmed by event monitor. b. 02/2014 rash on Coumadin, patient  decided to discontinue Xarelto due to possible rash, cost and lawyers ads on TV, agreed to take Plavix.  . Pneumonia 2016  . Tunnel vision    Since stroke  . Type II diabetes mellitus ([place])   . Venous stasis   . Weakness 01/11/2017    Past Surgical History:  Procedure Laterality Date  . CARDIAC CATHETERIZATION  1990's  . CARDIAC CATHETERIZATION N/A 08/17/2016   Procedure: Left Heart Cath and Coronary Angiography;  Surgeon: Peter M Martinique, MD;  Location: [facility];  Service: Cardiovascular;  Laterality: N/A;  . CARDIAC CATHETERIZATION N/A 08/19/2016   Procedure: Coronary Balloon Angioplasty;  Surgeon: Peter M Martinique, MD;  Location: [facility];  Service: Cardiovascular;  Laterality: N/A;  . CATARACT EXTRACTION  10/2016  . CEREBRAL ANGIOGRAM  ~ 2000  . COLONOSCOPY    . LEFT HEART CATH AND CORONARY ANGIOGRAPHY N/A 06/01/2017   Procedure: Left Heart Cath and Coronary Angiography;  Surgeon: Burnell Blanks, MD;  Location: [facility];  Service: Cardiovascular;  Laterality: N/A;  . LEFT HEART CATHETERIZATION WITH CORONARY ANGIOGRAM N/A 10/27/2012   Procedure: LEFT HEART CATHETERIZATION WITH CORONARY ANGIOGRAM;  Surgeon: Burnell Blanks, MD;  Location: [facility] C[facility] LAB;  Service: Cardiovascular;  Laterality: N/A;  . TOE AMPUTATION  2006; 2009   "Dr. Blenda Mounts; big toe left foot; little toe on right foot" (10/26/2012)  . TOE AMPUTATION Right 12/24/2015   2nd & 3 toes/notes 1/13//2017  . TOE AMPUTATION Right    5TH TOE  . 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 Breast cancer Mother 23  . Heart disease Father 44  . Asthma Neg Hx     Social History:  reports that he has quit smoking. His smoking use included cigarettes and cigars. He has a 30.00 pack-year smoking history. He quit  smokeless tobacco use about 3 years ago. He reports current alcohol use. He reports that he does not use drugs.  Allergies:  Allergies  Allergen Reactions  . Tolterodine Swelling and Other (See Comments)    (Detrol) Dry mouth/dry throat, severe swelling of the legs, and started a "downward spiral"  . Lipitor [Atorvastatin] Other (See Comments)    Muscle aches  . Xarelto [Rivaroxaban] Rash    Medications: I have reviewed the patient's current medications.  Results for orders placed or performed during the hospital encounter of 08/23/19 (from the past 48 hour(s))  Glucose, capillary     Status: Abnormal   Collection Time: 08/27/19 11:31 AM  Result Value Ref Range   Glucose-Capillary 144 (H) 70 - 99 mg/dL  Glucose, capillary     Status: Abnormal   Collection Time: 08/27/19  3:41 PM  Result Value Ref Range   Glucose-Capillary 182 (  H) 70 - 99 mg/dL  Vancomycin, random     Status: None   Collection Time: 08/27/19  4:53 PM  Result Value Ref Range   Vancomycin Rm 10     Comment:        Random Vancomycin therapeutic range is dependent on dosage and time of specimen collection. A peak range is 20.0-40.0 ug/mL A trough range is 5.0-15.0 ug/mL        Performed at [facility] [address] [place], [place] [address]   Glucose, capillary     Status: Abnormal   Collection Time: 08/27/19  8:11 PM  Result Value Ref Range   Glucose-Capillary 148 (H) 70 - 99 mg/dL  Glucose, capillary     Status: Abnormal   Collection Time: 08/27/19 11:52 PM  Result Value Ref Range   Glucose-Capillary 113 (H) 70 - 99 mg/dL  Glucose, capillary     Status: Abnormal   Collection Time: 08/28/19  3:48 AM  Result Value Ref Range   Glucose-Capillary 134 (H) 70 - 99 mg/dL  Magnesium     Status: None   Collection Time: 08/28/19  4:15 AM  Result Value Ref Range   Magnesium 2.1 1.7 - 2.4 mg/dL    Comment: Performed at [facility], [place] [address], [place], [place] [id number]  Basic metabolic  panel     Status: Abnormal   Collection Time: 08/28/19  4:15 AM  Result Value Ref Range   Sodium 134 (L) 135 - 145 mmol/L   Potassium 4.4 3.5 - 5.1 mmol/L   Chloride 100 98 - 111 mmol/L   CO2 25 22 - 32 mmol/L   Glucose, Bld 143 (H) 70 - 99 mg/dL   BUN 49 (H) 8 - 23 mg/dL   Creatinine, Ser 2.34 (H) 0.61 - 1.24 mg/dL   Calcium 9.0 8.9 - 10.3 mg/dL   GFR calc non Af Amer 26 (L) >60 mL/min   GFR calc Af Amer 30 (L) >60 mL/min   Anion gap 9 5 - 15    Comment: Performed at [facility] [address] [place], [place] [address]  Glucose, capillary     Status: Abnormal   Collection Time: 08/28/19  7:33 AM  Result Value Ref Range   Glucose-Capillary 134 (H) 70 - 99 mg/dL  Glucose, capillary     Status: Abnormal   Collection Time: 08/28/19 11:26 AM  Result Value Ref Range   Glucose-Capillary 160 (H) 70 - 99 mg/dL  Glucose, capillary     Status: Abnormal   Collection Time: 08/28/19  2:43 PM  Result Value Ref Range   Glucose-Capillary 177 (H) 70 - 99 mg/dL  Glucose, capillary     Status: Abnormal   Collection Time: 08/28/19  4:27 PM  Result Value Ref Range   Glucose-Capillary 193 (H) 70 - 99 mg/dL  Heparin level (unfractionated)     Status: Abnormal   Collection Time: 08/28/19  8:56 PM  Result Value Ref Range   Heparin Unfractionated 1.46 (H) 0.30 - 0.70 IU/mL    Comment: RESULTS CONFIRMED BY MANUAL DILUTION (NOTE) If heparin results are below expected values, and patient dosage has  been confirmed, suggest follow up testing of antithrombin III levels. Performed at [facility], [place] [address], [place], [place] [address]   Vancomycin, random     Status: None   Collection Time: 08/28/19  8:56 PM  Result Value Ref Range   Vancomycin Rm 16  Comment:        Random Vancomycin therapeutic range is dependent on dosage and time of specimen collection. A peak range is 20.0-40.0 ug/mL A trough range is 5.0-15.0 ug/mL        Performed at [facility] [address], [place], [place] [address]   Glucose, capillary     Status: Abnormal   Collection Time: 08/28/19  9:29 PM  Result Value Ref Range   Glucose-Capillary 160 (H) 70 - 99 mg/dL  Glucose, capillary     Status: Abnormal   Collection Time: 08/29/19 12:34 AM  Result Value Ref Range   Glucose-Capillary 158 (H) 70 - 99 mg/dL  Magnesium     Status: None   Collection Time: 08/29/19  5:34 AM  Result Value Ref Range   Magnesium 2.0 1.7 - 2.4 mg/dL    Comment: Performed at [facility], [place] [address], [place], [place] [address]  CBC     Status: Abnormal   Collection Time: 08/29/19  5:34 AM  Result Value Ref Range   WBC 7.5 4.0 - 10.5 K/uL   RBC 3.19 (L) 4.22 - 5.81 MIL/uL   Hemoglobin 9.5 (L) 13.0 - 17.0 g/dL   HCT 30.0 (L) 39.0 - 52.0 %   MCV 94.0 80.0 - 100.0 fL   MCH 29.8 26.0 - 34.0 pg   MCHC 31.7 30.0 - 36.0 g/dL   RDW 16.3 (H) 11.5 - 15.5 %   Platelets 138 (L) 150 - 400 K/uL   nRBC 0.0 0.0 - 0.2 %    Comment: Performed at [facility], [place] [address], [place], [place] [id number]  Basic metabolic panel     Status: Abnormal   Collection Time: 08/29/19  5:34 AM  Result Value Ref Range   Sodium 134 (L) 135 - 145 mmol/L   Potassium 4.2 3.5 - 5.1 mmol/L   Chloride 100 98 - 111 mmol/L   CO2 23 22 - 32 mmol/L   Glucose, Bld 145 (H) 70 - 99 mg/dL   BUN 45 (H) 8 - 23 mg/dL   Creatinine, Ser 1.75 (H) 0.61 - 1.24 mg/dL   Calcium 9.1 8.9 - 10.3 mg/dL   GFR calc non Af Amer 36 (L) >60 mL/min   GFR calc Af Amer 42 (L) >60 mL/min   Anion gap 11 5 - 15    Comment: Performed at [facility] [address], [place], [place] [address]  Heparin level (unfractionated)     Status: Abnormal   Collection Time: 08/29/19  5:34 AM  Result Value Ref Range   Heparin Unfractionated 1.14 (H) 0.30 - 0.70 IU/mL    Comment: RESULTS CONFIRMED BY MANUAL DILUTION (NOTE) If heparin results are below expected values, and patient dosage has  been confirmed, suggest follow up testing of  antithrombin III levels. Performed at [facility], [place] [address] [place], [place] [address]   APTT     Status: Abnormal   Collection Time: 08/29/19  5:34 AM  Result Value Ref Range   aPTT 77 (H) 24 - 36 seconds    Comment:        IF BASELINE aPTT IS ELEVATED, SUGGEST PATIENT RISK ASSESSMENT BE USED TO DETERMINE APPROPRIATE ANTICOAGULANT THERAPY. Performed at [facility], [place] [address] [place], [place] [address]   Glucose, capillary     Status: Abnormal   Collection Time: 08/29/19  7:23 AM  Result Value Ref Range   Glucose-Capillary  137 (H) 70 - 99 mg/dL    Dg Humerus Left  Result Date: 08/27/2019 CLINICAL DATA:  Recent fall. Left humerus pain. EXAM: LEFT HUMERUS - 2+ VIEW COMPARISON:  None. FINDINGS: An impacted fracture of the left humeral neck is seen. No evidence of shoulder dislocation. Generalized osteopenia is demonstrated. Degenerative spurring of acromioclavicular joint and distal acromion process noted. IMPRESSION: Impacted fracture of left humeral neck. Electronically Signed   By: Marlaine Hind M.D.   On: 08/27/2019 17:17    ROS Blood pressure 106/68, pulse 67, temperature 98.2 F (36.8 C), temperature source Oral, resp. rate 16, height 6\' 4"  (1.93 m), weight 130.1 kg, SpO2 98 %. Physical Exam  Constitutional: He is oriented to person, place, and time. No distress.  HENT:  Head: Normocephalic and atraumatic.  Neck: Normal range of motion. Neck supple.  Cardiovascular: Intact distal pulses.  Respiratory: Effort normal. No respiratory distress.  GI: Soft. He exhibits no distension.  Musculoskeletal:     Left shoulder: He exhibits decreased range of motion, deformity and decreased strength. He exhibits no tenderness and no bony tenderness.     Comments: ROM L shoulder he can actively forward flex and abduct to approximately 90 degrees with minimal discomfort, he can touch the top of his head and functionally use are for feeding ADLs etc.   Neurological:  He is alert and oriented to person, place, and time.  Skin: Skin is warm and dry. No rash noted. No erythema.  Psychiatric: He has a normal mood and affect. His behavior is normal.    Assessment/Plan: Displaced Left Proximal Humerus Fracture ?chronic  Had a long discussion with the patient today he does have increased displacement of his chronic L proximal humerus fracture.  It is unclear of chronicity of how long he has had this increased displacement of a subacute on chronic fracture vs chronic nonunion of his fracture.  Regardless with multiple medical comorbidities, including current UTI treatment on vanc and LV thrombus on heparin in addition to AKI, do not feel he is a good surgical candidate.  He may wear a sling for comfort.  If pain persists would recommend outpatient followup with Dr. Fredonia Highland.  Charles [name] 08/29/2019, 8:15 AM

## 2019-08-29 NOTE — Progress Notes (Signed)
Patient placed on CPAP 

## 2019-08-30 DIAGNOSIS — I509 Heart failure, unspecified: Secondary | ICD-10-CM

## 2019-08-30 DIAGNOSIS — Q2112 Patent foramen ovale: Secondary | ICD-10-CM

## 2019-08-30 DIAGNOSIS — Q211 Atrial septal defect: Secondary | ICD-10-CM

## 2019-08-30 DIAGNOSIS — I4819 Other persistent atrial fibrillation: Secondary | ICD-10-CM

## 2019-08-30 DIAGNOSIS — I5043 Acute on chronic combined systolic (congestive) and diastolic (congestive) heart failure: Secondary | ICD-10-CM

## 2019-08-30 DIAGNOSIS — I2781 Cor pulmonale (chronic): Secondary | ICD-10-CM

## 2019-08-30 LAB — CBC
HCT: 31 % — ABNORMAL LOW (ref 39.0–52.0)
Hemoglobin: 10.1 g/dL — ABNORMAL LOW (ref 13.0–17.0)
MCH: 30.7 pg (ref 26.0–34.0)
MCHC: 32.6 g/dL (ref 30.0–36.0)
MCV: 94.2 fL (ref 80.0–100.0)
Platelets: 132 10*3/uL — ABNORMAL LOW (ref 150–400)
RBC: 3.29 MIL/uL — ABNORMAL LOW (ref 4.22–5.81)
RDW: 16.4 % — ABNORMAL HIGH (ref 11.5–15.5)
WBC: 7.1 10*3/uL (ref 4.0–10.5)
nRBC: 0 % (ref 0.0–0.2)

## 2019-08-30 LAB — GLUCOSE, CAPILLARY
Glucose-Capillary: 109 mg/dL — ABNORMAL HIGH (ref 70–99)
Glucose-Capillary: 116 mg/dL — ABNORMAL HIGH (ref 70–99)
Glucose-Capillary: 156 mg/dL — ABNORMAL HIGH (ref 70–99)
Glucose-Capillary: 166 mg/dL — ABNORMAL HIGH (ref 70–99)
Glucose-Capillary: 131 mg/dL — ABNORMAL HIGH (ref 70–99)

## 2019-08-30 LAB — BASIC METABOLIC PANEL
Anion gap: 11 (ref 5–15)
BUN: 37 mg/dL — ABNORMAL HIGH (ref 8–23)
CO2: 22 mmol/L (ref 22–32)
Calcium: 8.9 mg/dL (ref 8.9–10.3)
Chloride: 102 mmol/L (ref 98–111)
Creatinine, Ser: 1.37 mg/dL — ABNORMAL HIGH (ref 0.61–1.24)
GFR calc Af Amer: 57 mL/min — ABNORMAL LOW (ref >60)
GFR calc non Af Amer: 49 mL/min — ABNORMAL LOW (ref >60)
Glucose, Bld: 121 mg/dL — ABNORMAL HIGH (ref 70–99)
Potassium: 4 mmol/L (ref 3.5–5.1)
Sodium: 135 mmol/L (ref 135–145)

## 2019-08-30 LAB — PROTIME-INR
INR: 1.2 (ref 0.8–1.2)
Prothrombin Time: 14.6 seconds (ref 11.4–15.2)

## 2019-08-30 LAB — APTT: aPTT: 83 seconds — ABNORMAL HIGH (ref 24–36)

## 2019-08-30 LAB — HEPARIN LEVEL (UNFRACTIONATED): Heparin Unfractionated: 0.88 IU/mL — ABNORMAL HIGH (ref 0.30–0.70)

## 2019-08-30 LAB — MAGNESIUM: Magnesium: 1.7 mg/dL (ref 1.7–2.4)

## 2019-08-30 MED ORDER — WARFARIN SODIUM 7.5 MG PO TABS
7.5000 mg | ORAL_TABLET | Freq: Once | ORAL | Status: AC
  Administered 2019-08-30: 7.5 mg via ORAL
  Filled 2019-08-30: qty 1

## 2019-08-30 MED ORDER — WARFARIN - PHARMACIST DOSING INPATIENT
Freq: Every day | Status: DC
  Administered 2019-08-30 – 2019-09-06 (×7)

## 2019-08-30 MED ORDER — TORSEMIDE 20 MG PO TABS
40.0000 mg | ORAL_TABLET | Freq: Every day | ORAL | Status: DC
  Administered 2019-08-30 – 2019-09-06 (×8): 40 mg via ORAL
  Filled 2019-08-30 (×9): qty 2

## 2019-08-30 NOTE — Progress Notes (Signed)
Came into patient room with patient talking and hollaring.  Tried to rouse patient up thinking maybe he was having a bad dream, patient aroused but he refused his medication and seems a little disoriented and agitated.  Will make RN aware of patient state, but will not be putting CPAP on patient until state of mind improves.  Feel that it is unsafe at this time.  Patient sat was 97% on 2L [place] at this time.

## 2019-08-30 NOTE — Progress Notes (Addendum)
Progress Note  Patient Name: Charles [name] Date of Encounter: 08/30/2019  Primary Cardiologist:  Sanda Klein, MD  Subjective   SOB w/ conversation, moving in the bed CPAP did not work for him, would prefer O2, wants warm air w/ white noise and no straps  Inpatient Medications    Scheduled Meds: . Chlorhexidine Gluconate Cloth  6 each Topical Daily  . feeding supplement (NEPRO CARB STEADY)  237 mL Oral TID BM  . finasteride  5 mg Oral Daily  . insulin aspart  0-9 Units Subcutaneous Q4H  . mometasone-formoterol  2 puff Inhalation BID  . mupirocin ointment  1 application Nasal BID   Continuous Infusions: . sodium chloride 500 mL/hr at 08/28/19 0946  . sodium chloride    . heparin 1,250 Units/hr (08/30/19 0526)  . vancomycin 1,250 mg (08/29/19 2155)   PRN Meds: acetaminophen, albuterol, ondansetron **OR** ondansetron (ZOFRAN) IV, oxyCODONE   Vital Signs    Vitals:   08/29/19 1953 08/29/19 2035 08/30/19 0417 08/30/19 0804  BP: 112/67  117/65 120/63  Pulse: 75  94 95  Resp: 20  18   Temp: 97.7 F (36.5 C)  98.5 F (36.9 C) 97.9 F (36.6 C)  TempSrc: Oral  Oral Oral  SpO2: 91% 98% 97% 96%  Weight:   131.2 kg   Height:        Intake/Output Summary (Last 24 hours) at 08/30/2019 1031 Last data filed at 08/30/2019 [id number] Gross per 24 hour  Intake 1439.49 ml  Output 2100 ml  Net -660.51 ml   Filed Weights   08/27/19 0457 08/28/19 0200 08/30/19 0417  Weight: 128.2 kg 130.1 kg 131.2 kg   Last Weight  Most recent update: 08/30/2019  6:23 AM   Weight  131.2 kg (289 lb 3.9 oz)           Weight change:    Telemetry    Atrial fib, controlled rate - Personally Reviewed  ECG    None today - Personally Reviewed  Physical Exam   General: Well developed, morbidly obese, male, gets SOB w/ conversation Head: Normocephalic, atraumatic. Very poor dentition  Neck: Supple without bruits, JVD elevated. Lungs:  Resp regular and unlabored, scattered rales w/  decreased BS bases. Heart: Irreg R&R, S1, S2, no S3, S4, or murmur; no rub. Abdomen: Soft, non-tender, non-distended with normoactive bowel sounds. No hepatomegaly. No rebound/guarding. No obvious abdominal masses. Extremities: No clubbing, cyanosis, 1+ edema. Radial pulses are 2+ bilaterally. S/p R transmetatarsal removal and some toes removed on L. Neuro: Alert and oriented X 2. Moves all extremities spontaneously. Psych: Normal affect.  Labs    Hematology Recent Labs  Lab 08/27/19 0452 08/29/19 0534 08/30/19 0730  WBC 8.7 7.5 7.1  RBC 3.04* 3.19* 3.29*  HGB 9.1* 9.5* 10.1*  HCT 29.4* 30.0* 31.0*  MCV 96.7 94.0 94.2  MCH 29.9 29.8 30.7  MCHC 31.0 31.7 32.6  RDW 16.3* 16.3* 16.4*  PLT 156 138* 132*    Chemistry Recent Labs  Lab 08/23/19 1655  08/24/19 1025  08/25/19 0343 08/25/19 1730 08/26/19 0413  08/28/19 0415 08/29/19 0534 08/30/19 0730  NA 132*   < > 131*   < > 134* 134* 134*   < > 134* 134* 135  K 5.4*   < > 6.8*   < > 4.9 4.4 4.3   < > 4.4 4.2 4.0  CL 88*  --  89*   < > 95* 97* 98   < > 100 100 102  CO2 28  --  26   < > 26 27 26    < > 25 23 22   GLUCOSE 127*  --  105*   < > 111* 112* 104*   < > 143* 145* 121*  BUN 79*  --  89*   < > 77* 49* 40*   < > 49* 45* 37*  CREATININE 4.58*  --  5.05*   < > 4.01* 2.67* 2.17*   < > 2.34* 1.75* 1.37*  CALCIUM 9.5  --  9.3   < > 8.5* 8.2* 8.3*   < > 9.0 9.1 8.9  PROT 6.4*  --  6.3*  --   --   --   --   --   --   --   --   ALBUMIN 3.0*  --  3.1*  --  3.0* 2.9* 2.9*  --   --   --   --   AST 22  --  29  --   --   --   --   --   --   --   --   ALT 14  --  14  --   --   --   --   --   --   --   --   ALKPHOS 55  --  50  --   --   --   --   --   --   --   --   BILITOT 1.0  --  1.2  --   --   --   --   --   --   --   --   GFRNONAA 11*  --  10*   < > 13* 22* 28*   < > 26* 36* 49*  GFRAA 13*  --  12*   < > 16* 26* 33*   < > 30* 42* 57*  ANIONGAP 16*  --  16*   < > 13 10 10    < > 9 11 11    < > = values in this interval not  displayed.     High Sensitivity Troponin:   Recent Labs  Lab 08/23/19 2245 08/24/19 0329  TROPONINIHS 45* 50*     BNP Recent Labs  Lab 08/23/19 1650 08/25/19 0343  BNP 1,871.6* 878.3*    Lab Results  Component Value Date   INR 2.3 (H) 08/23/2019   INR 1.06 06/01/2017   INR 1.11 08/15/2016     Radiology    Dg Humerus Left  Result Date: 08/27/2019 CLINICAL DATA:  Recent fall. Left humerus pain. EXAM: LEFT HUMERUS - 2+ VIEW COMPARISON:  None. FINDINGS: An impacted fracture of the left humeral neck is seen. No evidence of shoulder dislocation. Generalized osteopenia is demonstrated. Degenerative spurring of acromioclavicular joint and distal acromion process noted. IMPRESSION: Impacted fracture of left humeral neck. Electronically Signed   By: Marlaine Hind M.D.   On: 08/27/2019 17:17     Cardiac Studies   ECHO:  08/29/2019  1. Mild dyskinesis of the left ventricular, entire apical segment.  2. Apical left ventricular aneurysm.  3. Moderate, fixed thrombus on the apical wall of the left ventricle.  4. The left ventricle has mildly reduced systolic function, with an ejection fraction of 45-50%. The cavity size was normal. Left ventricular diastolic function could not be evaluated secondary to atrial fibrillation. There is right ventricular volume  and pressure overload.  5. The right ventricle has severely reduced systolic function. The cavity  was severely enlarged. There is no increase in right ventricular wall thickness. Right ventricular systolic pressure is moderately elevated.  6. Left atrial size was moderately dilated.  7. Right atrial size was severely dilated.  8. Mild mitral valve prolapse, probably due to left heart "underfilling".  9. The mitral valve is grossly normal. Mild thickening of the mitral valve leaflet. There is mild to moderate mitral annular calcification present. 10. The tricuspid valve is grossly normal. Tricuspid valve regurgitation is  moderate-severe. 11. Mild thickening of the aortic valve. Sclerosis without any evidence of stenosis of the aortic valve. 12. The aorta is normal unless otherwise noted. 13. The inferior vena cava was dilated in size with <50% respiratory variability. 14. Evidence of right to left atrial level shunting detected by color flow Doppler. 15. When compared to the prior study: from 2018, there are multiple changes. The most striking abnormality is severe dilation and severe dysfunction of the right ventricle, with evidence of pressure and volume overload.     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 Although not previously described, there are findings that suggest this is a more chronic abnormality.     There is probably a small "stretch" patent foramen ovale with intermittent high velocity, but low volume right to left atrial shunting.  Patient Profile     78 y.o. male w/ hx CAD (MI /2000 with PTCA of pLAD, failed PCI of RCA 2013 and failed balloon angioplasty of RCA in 2017, now managed medically), chronic diastolic heart failure, DM, OSA  on CPAP, COPD, hx of CVA, Afib pt has refused anticoagulation, hyperlipidemia, hard of hearing, legally blind, chronic leg edema, and venous stasis, was admitted 09/10 with shock (?septic + cardiogenic), acute resp failure and ARF. Cards saw 09/16 for LV thrombus  Assessment & Plan    1. 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 PAF - likely persistent, since in Afib on admit - rate is ok, not on any rate-control  meds - currently on heparin, MD advise on starting coumadin and loading Plavix  Otherwise, per IM Principal Problem:   ARF (acute renal failure) ([name]) Active Problems:   CAD (coronary artery disease)   Type 2 diabetes, uncontrolled, with retinopathy ([name])   PAF (paroxysmal atrial fibrillation) ([name])   Shock ([place])   Hypotension   Lactic acidosis   Pneumonia due to infectious organism    Signed, Rosaria Ferries , PA-C 10:31 AM 08/30/2019 Pager: [phone]  I have seen and examined the patient along with Rosaria Ferries , PA-C.  I have reviewed the chart, notes and new data.  I agree with PA/NP's note.  Key new complaints: Sleepy, does not really want to talk to me today.  Denies dyspnea but is visibly tachypneic and interrupts sentences when he speaks.  Does not appear to be in acute distress however.  Does not like CPAP. Key examination changes: Morbidly obese hard to see jugular veins due to body habitus.  Persistent dependent edema, irregular rhythm, no murmurs heard. Key new findings /  data: Creatinine continues to improve rapidly.  Hemoglobin is stable on intravenous heparin.  PLAN: - It seems to be safe to at least initiate warfarin, while he is on the intravenous heparin.  If he does not develop bleeding when he is therapeutically anticoagulated on warfarin, then I would add clopidogrel 75 mg without a loading dose. - He has right heart failure due to obesity and untreated obstructive sleep apnea (cannot really say how much left heart failure is contributing to it, since left ventricular diastolic function is hard to assess in atrial fibrillation).  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 - renal failure resolved. I think we can start giving him diuretics.  Target "dry weight" is uncertain.  He has lost substantial weight compared to earlier in the year.   Prior to this admission he was taking torsemide 40 mg twice daily with an option to take additional 20  mg as needed, but there is also a mention of him having 100 mg tablets available.  I would probably recommend restarting 40 mg once daily and adjusting depending on volume status.  Sanda Klein, MD, [place] [phone] 08/30/2019, 12:43 PM

## 2019-08-30 NOTE — Progress Notes (Signed)
PT Cancellation Note  Patient Details Name: [name] MRN: [id number] DOB: [birth year]   Cancelled Treatment:    Reason Eval/Treat Not Completed: Other (comment) - pt screaming and cursing at NT upon PT arrival to room, per NT pt is agitated. Pt not appropriate for PT eval today, will check back tomorrow.  Julien Girt, PT Acute Rehabilitation Services Pager [phone]  Office [phone]    Roxine Caddy D Elonda Husky 08/30/2019, 4:25 PM

## 2019-08-30 NOTE — Progress Notes (Signed)
PROGRESS NOTE        PATIENT DETAILS Name: Charles [name] Age: 78 y.o. Sex: male Date of Birth: 1941-05-27 Admit Date: 08/23/2019 Admitting Physician Collene Gobble, MD IR:[id number], Evie Lacks, MD  Brief Narrative: Patient is a 78 y.o. male history of CAD, chronic diastolic heart failure, COPD, atrial fibrillation, history of CVA-who presented to the hospital for worsening shortness of breath, increasing lower extremity edema-patient was found to have acute hypoxemic respiratory failure in the setting of decompensated combined systolic/diastolic heart failure, acute renal failure.  Managed in the intensive care unit-required CRRT BiPAP support.  Further evaluation with an echo revealed LV thrombus.  Upon further stability-transferred to the hospitalist service on 9/15.  Subjective: Sleeping comfortably when I walked in-denies any chest pain or shortness of breath.  Assessment/Plan: Acute kidney injury: Likely hemodynamically mediated-briefly required CRRT while in the ICU-renal function has improved significantly-creatinine is now close to normal.  Nephrology has signed off   Acute hypoxemic respiratory failure: Likely secondary to decompensated combined systolic/diastolic heart failure in the setting of worsening renal function.  Briefly required BiPAP-required CRRT for volume removal-now on room air.  Volume status remains stable.  Acute on chronic systolic and diastolic heart failure (EF 45-50%) along with right ventricular failure: Volume status remains stable without the use of any diuretics-appreciate cardiology input.    LV thrombus: Remains on IV heparin-once hematuria has resolved-we will initiate Coumadin and Plavix as recommended by cardiology.  PAF: On Eliquis prior to this hospital stay-currently on IV heparin-given LV thrombus-we will change patient to Coumadin once hematuria resolves.  Appreciate cardiology input.  Hematuria: Continues to have  hematuria-urology following-difficult situation-patient has LV thrombus-and remains on IV heparin.  Cellulitis of the left hip/abdomen along with MRSA UTI: Afebrile-improved-once patient completes a 7-day course of IV vancomycin-I think we can discontinue at this point.  I do not see any major evidence of of cellulitis in the left hip/abdominal area.    DM-2: CBGs stable-continue with SSI  Chronic fracture of the left humeral head: Orthopedics following-recommendations are for supportive care.  Debility/deconditioning: PT/OT eval  S/p right TMA  Obesity: Estimated body mass index is 35.21 kg/m as calculated from the following:   Height as of this encounter: 6\' 4"  (1.93 m).   Weight as of this encounter: 131.2 kg.    Diet: Diet Order            Diet Heart Room service appropriate? Yes; Fluid consistency: Thin  Diet effective now               DVT Prophylaxis: Full dose anticoagulation with Heparin  Code Status: Partial Code: DNI-no CPR-OK for meds  Family Communication: Spoke with spouse over phone on 9/17  Barriers to discharge: Ongoing hematuria-remains on IV heparin-awaiting PT evaluation.  Disposition Plan: Remain inpatient-but will plan on Home health vs SNF on discharge.  Await PT eval  Antimicrobial agents: Anti-infectives (From admission, onward)   Start     Dose/Rate Route Frequency Ordered Stop   08/28/19 2200  vancomycin (VANCOCIN) 1,250 mg in sodium chloride 0.9 % 250 mL IVPB     1,250 mg 166.7 mL/hr over 90 Minutes Intravenous Every 24 hours 08/28/19 2144     08/27/19 2100  ceFEPIme (MAXIPIME) 1 g in sodium chloride 0.9 % 100 mL IVPB  Status:  Discontinued  1 g 200 mL/hr over 30 Minutes Intravenous Every 24 hours 08/27/19 0729 08/27/19 1022   08/27/19 1800  vancomycin (VANCOCIN) 1,250 mg in sodium chloride 0.9 % 250 mL IVPB     1,250 mg 166.7 mL/hr over 90 Minutes Intravenous  Once 08/27/19 1750 08/27/19 1951   08/27/19 0726  vancomycin variable  dose per unstable renal function (pharmacist dosing)  Status:  Discontinued      Does not apply See admin instructions 08/27/19 0726 08/29/19 1249   08/25/19 1600  vancomycin (VANCOCIN) 1,250 mg in sodium chloride 0.9 % 250 mL IVPB  Status:  Discontinued     1,250 mg 166.7 mL/hr over 90 Minutes Intravenous Every 24 hours 08/24/19 1803 08/27/19 0726   08/24/19 1815  ceFEPIme (MAXIPIME) 2 g in sodium chloride 0.9 % 100 mL IVPB  Status:  Discontinued     2 g 200 mL/hr over 30 Minutes Intravenous Every 12 hours 08/24/19 1803 08/27/19 0729   08/24/19 1545  ceFEPIme (MAXIPIME) 500 mg in dextrose 5 % 50 mL IVPB  Status:  Discontinued     500 mg 100 mL/hr over 30 Minutes Intravenous Every 24 hours 08/24/19 1537 08/24/19 1803   08/24/19 1545  vancomycin (VANCOCIN) 2,000 mg in sodium chloride 0.9 % 500 mL IVPB     2,000 mg 250 mL/hr over 120 Minutes Intravenous  Once 08/24/19 1537 08/24/19 1848   08/24/19 1537  vancomycin variable dose per unstable renal function (pharmacist dosing)  Status:  Discontinued      Does not apply See admin instructions 08/24/19 1537 08/24/19 1803   08/23/19 1730  cefTRIAXone (ROCEPHIN) 2 g in sodium chloride 0.9 % 100 mL IVPB  Status:  Discontinued     2 g 200 mL/hr over 30 Minutes Intravenous Every 24 hours 08/23/19 1721 08/24/19 1526   08/23/19 1730  doxycycline (VIBRAMYCIN) 100 mg in sodium chloride 0.9 % 250 mL IVPB     100 mg 125 mL/hr over 120 Minutes Intravenous  Once 08/23/19 1721 08/23/19 1956      Procedures:   CONSULTS:  cardiology, pulmonary/intensive care, nephrology and urology  Time spent: 25 minutes-Greater than 50% of this time was spent in counseling, explanation of diagnosis, planning of further management, and coordination of care.  MEDICATIONS: Scheduled Meds: . Chlorhexidine Gluconate Cloth  6 each Topical Daily  . feeding supplement (NEPRO CARB STEADY)  237 mL Oral TID BM  . finasteride  5 mg Oral Daily  . insulin aspart  0-9 Units  Subcutaneous Q4H  . mometasone-formoterol  2 puff Inhalation BID  . mupirocin ointment  1 application Nasal BID   Continuous Infusions: . sodium chloride 500 mL/hr at 08/28/19 0946  . sodium chloride    . heparin 1,250 Units/hr (08/30/19 0526)  . vancomycin 1,250 mg (08/29/19 2155)   PRN Meds:.acetaminophen, albuterol, ondansetron **OR** ondansetron (ZOFRAN) IV, oxyCODONE   PHYSICAL EXAM: Vital signs: Vitals:   08/29/19 1953 08/29/19 2035 08/30/19 0417 08/30/19 0804  BP: 112/67  117/65 120/63  Pulse: 75  94 95  Resp: 20  18   Temp: 97.7 F (36.5 C)  98.5 F (36.9 C) 97.9 F (36.6 C)  TempSrc: Oral  Oral Oral  SpO2: 91% 98% 97% 96%  Weight:   131.2 kg   Height:       Filed Weights   08/27/19 0457 08/28/19 0200 08/30/19 0417  Weight: 128.2 kg 130.1 kg 131.2 kg   Body mass index is 35.21 kg/m.   Gen Exam:Alert awake-not in  any distress HEENT:atraumatic, normocephalic Chest: B/L clear to auscultation anteriorly CVS:S1S2 regular Abdomen:soft non tender, non distended Extremities:no edema - s/p right TMA.  Missing a few toes on the left foot as well. Neurology: Non focal Skin: no rash  I have personally reviewed following labs and imaging studies  LABORATORY DATA: CBC: Recent Labs  Lab 08/23/19 1655  08/24/19 1025 08/25/19 0343 08/26/19 0413 08/27/19 0452 08/29/19 0534 08/30/19 0730  WBC 12.3*  --  12.4* 9.7 9.6 8.7 7.5 7.1  NEUTROABS 11.0*  --  11.0*  --   --   --   --   --   HGB 10.8*   < > 10.5* 9.8* 9.4* 9.1* 9.5* 10.1*  HCT 33.4*   < > 33.7* 31.6* 29.8* 29.4* 30.0* 31.0*  MCV 93.0  --  95.7 95.2 95.2 96.7 94.0 94.2  PLT 249  --  237 211 179 156 138* 132*   < > = values in this interval not displayed.    Basic Metabolic Panel: Recent Labs  Lab 08/24/19 1237  08/25/19 0343 08/25/19 1730 08/26/19 0413 08/27/19 0452 08/28/19 0415 08/29/19 0534 08/30/19 0730  NA  --    < > 134* 134* 134* 134* 134* 134* 135  K  --    < > 4.9 4.4 4.3 4.3 4.4 4.2  4.0  CL  --    < > 95* 97* 98 101 100 100 102  CO2  --    < > 26 27 26 27 25 23 22   GLUCOSE  --    < > 111* 112* 104* 123* 143* 145* 121*  BUN  --    < > 77* 49* 40* 42* 49* 45* 37*  CREATININE  --    < > 4.01* 2.67* 2.17* 2.22* 2.34* 1.75* 1.37*  CALCIUM  --    < > 8.5* 8.2* 8.3* 8.7* 9.0 9.1 8.9  MG 2.0  --  2.1  --  2.2 2.2 2.1 2.0 1.7  PHOS 5.7*  --  4.8* 3.4 2.6  --   --   --   --    < > = values in this interval not displayed.    GFR: Estimated Creatinine Clearance: 65.7 mL/min (A) (by C-G formula based on SCr of 1.37 mg/dL (H)).  Liver Function Tests: Recent Labs  Lab 08/23/19 1655 08/24/19 1025 08/25/19 0343 08/25/19 1730 08/26/19 0413  AST 22 29  --   --   --   ALT 14 14  --   --   --   ALKPHOS 55 50  --   --   --   BILITOT 1.0 1.2  --   --   --   PROT 6.4* 6.3*  --   --   --   ALBUMIN 3.0* 3.1* 3.0* 2.9* 2.9*   No results for input(s): LIPASE, AMYLASE in the last 168 hours. No results for input(s): AMMONIA in the last 168 hours.  Coagulation Profile: Recent Labs  Lab 08/23/19 1845  INR 2.3*    Cardiac Enzymes: Recent Labs  Lab 08/24/19 1025  CKTOTAL 61    BNP (last 3 results) No results for input(s): PROBNP in the last 8760 hours.  HbA1C: No results for input(s): HGBA1C in the last 72 hours.  CBG: Recent Labs  Lab 08/29/19 1620 08/29/19 1954 08/29/19 2356 08/30/19 0420 08/30/19 0721  GLUCAP 134* 167* 118* 109* 116*    Lipid Profile: No results for input(s): CHOL, HDL, LDLCALC, TRIG, CHOLHDL, LDLDIRECT in the last  72 hours.  Thyroid Function Tests: No results for input(s): TSH, T4TOTAL, FREET4, T3FREE, THYROIDAB in the last 72 hours.  Anemia Panel: No results for input(s): VITAMINB12, FOLATE, FERRITIN, TIBC, IRON, RETICCTPCT in the last 72 hours.  Urine analysis:    Component Value Date/Time   COLORURINE AMBER (A) 08/23/2019 2231   APPEARANCEUR HAZY (A) 08/23/2019 2231   LABSPEC 1.017 08/23/2019 2231   PHURINE 5.0 08/23/2019 2231    GLUCOSEU NEGATIVE 08/23/2019 2231   GLUCOSEU NEGATIVE 05/25/2019 1353   HGBUR MODERATE (A) 08/23/2019 2231   BILIRUBINUR NEGATIVE 08/23/2019 2231   KETONESUR NEGATIVE 08/23/2019 2231   PROTEINUR 100 (A) 08/23/2019 2231   UROBILINOGEN 1.0 05/25/2019 1353   NITRITE NEGATIVE 08/23/2019 2231   LEUKOCYTESUR SMALL (A) 08/23/2019 2231    Sepsis Labs: Lactic Acid, Venous    Component Value Date/Time   LATICACIDVEN 1.4 08/25/2019 0343    MICROBIOLOGY: Recent Results (from the past 240 hour(s))  SARS Coronavirus 2 [facility] order, Performed in [facility] hospital lab) Nasopharyngeal Nasopharyngeal Swab     Status: None   Collection Time: 08/23/19  5:16 PM   Specimen: Nasopharyngeal Swab  Result Value Ref Range Status   SARS Coronavirus 2 NEGATIVE NEGATIVE Final    Comment: (NOTE) If result is NEGATIVE SARS-CoV-2 target nucleic acids are NOT DETECTED. The SARS-CoV-2 RNA is generally detectable in upper and lower  respiratory specimens during the acute phase of infection. The lowest  concentration of SARS-CoV-2 viral copies this assay can detect is 250  copies / mL. A negative result does not preclude SARS-CoV-2 infection  and should not be used as the sole basis for treatment or other  patient management decisions.  A negative result may occur with  improper specimen collection / handling, submission of specimen other  than nasopharyngeal swab, presence of viral mutation(s) within the  areas targeted by this assay, and inadequate number of viral copies  (<250 copies / mL). A negative result must be combined with clinical  observations, patient history, and epidemiological information. If result is POSITIVE SARS-CoV-2 target nucleic acids are DETECTED. The SARS-CoV-2 RNA is generally detectable in upper and lower  respiratory specimens dur ing the acute phase of infection.  Positive  results are indicative of active infection with SARS-CoV-2.  Clinical  correlation with patient  history and other diagnostic information is  necessary to determine patient infection status.  Positive results do  not rule out bacterial infection or co-infection with other viruses. If result is PRESUMPTIVE POSTIVE SARS-CoV-2 nucleic acids MAY BE PRESENT.   A presumptive positive result was obtained on the submitted specimen  and confirmed on repeat testing.  While 2019 novel coronavirus  (SARS-CoV-2) nucleic acids may be present in the submitted sample  additional confirmatory testing may be necessary for epidemiological  and / or clinical management purposes  to differentiate between  SARS-CoV-2 and other Sarbecovirus currently known to infect humans.  If clinically indicated additional testing with an alternate test  methodology [phone]) is advised. The SARS-CoV-2 RNA is generally  detectable in upper and lower respiratory sp ecimens during the acute  phase of infection. The expected result is Negative. Fact Sheet for Patients:  StrictlyIdeas.no Fact Sheet for Healthcare Providers: BankingDealers.co.za This test is not yet approved or cleared by the [place] FDA and has been authorized for detection and/or diagnosis of SARS-CoV-2 by FDA under an Emergency Use Authorization (EUA).  This EUA will remain in effect (meaning this test can be used) for the duration of  the COVID-19 declaration under Section 564(b)(1) of the Act, 21 U.S.C. section 360bbb-3(b)(1), unless the authorization is terminated or revoked sooner. Performed at [facility], [place] [address], [place], [place] [address]   Blood Culture (routine x 2)     Status: None   Collection Time: 08/23/19  5:26 PM   Specimen: BLOOD  Result Value Ref Range Status   Specimen Description BLOOD BLOOD LEFT FOREARM  Final   Special Requests   Final    BOTTLES DRAWN AEROBIC AND ANAEROBIC Blood Culture adequate volume   Culture   Final    NO GROWTH 5 DAYS Performed at [facility], [place] [address] [place], [place] [address]    Report Status 08/28/2019 FINAL  Final  Blood Culture (routine x 2)     Status: None   Collection Time: 08/23/19  6:02 PM   Specimen: BLOOD  Result Value Ref Range Status   Specimen Description BLOOD RIGHT ANTECUBITAL  Final   Special Requests   Final    BOTTLES DRAWN AEROBIC ONLY Blood Culture results may not be optimal due to an inadequate volume of blood received in culture bottles   Culture   Final    NO GROWTH 5 DAYS Performed at [facility], [place] [address], [place], [place] [address]    Report Status 08/28/2019 FINAL  Final  Urine culture     Status: Abnormal   Collection Time: 08/23/19  7:21 PM   Specimen: Urine, Random  Result Value Ref Range Status   Specimen Description URINE, RANDOM  Final   Special Requests   Final    NONE Performed at [facility], [place] [address] [place], [place] [address]    Culture (A)  Final    50,000 COLONIES/mL METHICILLIN RESISTANT STAPHYLOCOCCUS AUREUS   Report Status 08/26/2019 FINAL  Final   Organism ID, Bacteria METHICILLIN RESISTANT STAPHYLOCOCCUS AUREUS (A)  Final      Susceptibility   Methicillin resistant staphylococcus aureus - MIC*    CIPROFLOXACIN >=8 RESISTANT Resistant     GENTAMICIN <=0.5 SENSITIVE Sensitive     NITROFURANTOIN <=16 SENSITIVE Sensitive     OXACILLIN >=4 RESISTANT Resistant     TETRACYCLINE >=16 RESISTANT Resistant     VANCOMYCIN 1 SENSITIVE Sensitive     TRIMETH/SULFA <=10 SENSITIVE Sensitive     CLINDAMYCIN >=8 RESISTANT Resistant     RIFAMPIN <=0.5 SENSITIVE Sensitive     Inducible Clindamycin NEGATIVE Sensitive     * 50,000 COLONIES/mL METHICILLIN RESISTANT STAPHYLOCOCCUS AUREUS  MRSA PCR Screening     Status: Abnormal   Collection Time: 08/24/19  2:14 PM   Specimen: Nasal Mucosa; Nasopharyngeal  Result Value Ref Range Status   MRSA by PCR POSITIVE (A) NEGATIVE Final    Comment:        The GeneXpert MRSA Assay (FDA approved for  NASAL specimens only), is one component of a comprehensive MRSA colonization surveillance program. It is not intended to diagnose MRSA infection nor to guide or monitor treatment for MRSA infections. RESULT CALLED TO, READ BACK BY AND VERIFIED WITH: Everlean Alstrom RN 15:40 08/24/19 ([email or web address]) Performed at [facility], [place] [address], [place], [place] [address]   Urine culture     Status: None   Collection Time: 08/24/19  2:39 PM   Specimen: Urine, Catheterized  Result Value Ref Range Status   Specimen Description URINE, CATHETERIZED  Final   Special Requests Normal  Final   Culture  Final    NO GROWTH Performed at [facility], [place] [address], [place], [place] [address]    Report Status 08/25/2019 FINAL  Final    RADIOLOGY STUDIES/RESULTS: Dg Chest 1 View  Result Date: 08/24/2019 CLINICAL DATA:  78 year old male with central line placement. EXAM: CHEST  1 VIEW COMPARISON:  Chest radiograph dated 06/02 FINDINGS: Right IJ central venous line with tip close to the cavoatrial junction. No pneumothorax. There is a right pleural effusion with right lung base atelectasis. Infiltrate is not excluded. Clinical correlation is recommended. The left lung is clear. Stable cardiomegaly. Atherosclerotic calcification of the aortic arch. No acute osseous pathology. Osteopenia with degenerative changes of the spine. Old left humeral neck fracture. IMPRESSION: 1. Right IJ central venous line with tip close to the cavoatrial junction. No pneumothorax. 2. Right pleural effusion with right lung base atelectasis versus infiltrate. Electronically Signed   By: Anner Crete M.D.   On: 08/24/2019 18:32   US Renal  Result Date: 08/24/2019 CLINICAL DATA:  Acute renal failure EXAM: RENAL / URINARY TRACT ULTRASOUND COMPLETE COMPARISON:  None. FINDINGS: Somewhat limited exam due to overlying bowel gas. Right Kidney: Renal measurements: 13.3 x 6.6 x 6.7 = volume: 316 mL . Echogenicity within normal limits.  No mass or hydronephrosis visualized. Left Kidney: Renal measurements: 11.8 x 6.2 x 7.0 = volume: 266 mL. Echogenicity within normal limits. No mass or hydronephrosis visualized. Bladder: The bladder is partially distended there is a heterogeneous nodular appearance to the prostate which protrudes into the posterior bladder. IMPRESSION: Somewhat limited exam due to overlying bowel gas, however normal appearing kidneys. Enlarged nodular prostate protruding into the posterior bladder. Electronically Signed   By: Prudencio Pair M.D.   On: 08/24/2019 02:18   Dg Chest Port 1 View  Result Date: 08/25/2019 CLINICAL DATA:  CHF EXAM: PORTABLE CHEST 1 VIEW COMPARISON:  Yesterday FINDINGS: Shortening of the right IJ catheter, tip near the SVC brachiocephalic confluence. Cardiomegaly with haziness of the right chest from pleural effusion. No superimposed Kerley lines or air bronchogram. IMPRESSION: 1. Shorter right IJ catheter, tip near the SVC brachiocephalic confluence. 2. Cardiomegaly and right pleural effusion that is unchanged. Electronically Signed   By: Monte Fantasia M.D.   On: 08/25/2019 07:54   Dg Chest Port 1 View  Result Date: 08/23/2019 CLINICAL DATA:  Dyspnea EXAM: PORTABLE CHEST 1 VIEW COMPARISON:  06/01/2017 FINDINGS: Cardiomegaly. Mild pulmonary vascular congestion. Calcific aortic knob. Right basilar opacity. No pneumothorax. Advanced degenerative changes of the shoulders. IMPRESSION: 1. Right basilar opacity which may reflect pleural effusion with atelectasis and/or pneumonia. 2. Cardiomegaly and pulmonary vascular congestion suggesting CHF. Electronically Signed   By: Davina Poke M.D.   On: 08/23/2019 17:12   Dg Humerus Left  Result Date: 08/27/2019 CLINICAL DATA:  Recent fall. Left humerus pain. EXAM: LEFT HUMERUS - 2+ VIEW COMPARISON:  None. FINDINGS: An impacted fracture of the left humeral neck is seen. No evidence of shoulder dislocation. Generalized osteopenia is demonstrated.  Degenerative spurring of acromioclavicular joint and distal acromion process noted. IMPRESSION: Impacted fracture of left humeral neck. Electronically Signed   By: Marlaine Hind M.D.   On: 08/27/2019 17:17     LOS: 7 days   Oren Binet, MD  [facility]  If 7PM-7AM, please contact night-coverage  Please page via [email or web address]  Go to [email or web address] and use [facility] universal password to access. If you do not have the password, please contact the hospital operator.  Locate the [facility] provider  you are looking for under [facility] and page to a number that you can be directly reached. If you still have difficulty reaching the provider, please page the [facility] (Director on Call) for the Hospitalists listed on amion for assistance.  08/30/2019, 10:45 AM

## 2019-08-30 NOTE — Progress Notes (Signed)
[facility] for Heparin + warfarin Indication: atrial fibrillation and LV Thrombus  Allergies  Allergen Reactions  . Tolterodine Swelling and Other (See Comments)    (Detrol) Dry mouth/dry throat, severe swelling of the legs, and started a "downward spiral"  . Lipitor [Atorvastatin] Other (See Comments)    Muscle aches  . Xarelto [Rivaroxaban] Rash    Patient Measurements: Height: 6\' 4"  (193 cm) Weight: 289 lb 3.9 oz (131.2 kg) IBW/kg (Calculated) : 86.8 Heparin Dosing Weight: 115 kg  Vital Signs: Temp: 97.8 F (36.6 C) (09/17 1400) Temp Source: Axillary (09/17 1400) BP: 107/65 (09/17 1400) Pulse Rate: 78 (09/17 1400)  Labs: Recent Labs    08/28/19 0415 08/28/19 2056 08/29/19 0534 08/29/19 1635 08/30/19 0730 08/30/19 1543  HGB  --   --  9.5*  --  10.1*  --   HCT  --   --  30.0*  --  31.0*  --   PLT  --   --  138*  --  132*  --   APTT  --   --  77* 86* 83*  --   LABPROT  --   --   --   --   --  14.6  INR  --   --   --   --   --  1.2  HEPARINUNFRC  --  1.46* 1.14*  --  0.88*  --   CREATININE 2.34*  --  1.75*  --  1.37*  --     Estimated Creatinine Clearance: 65.7 mL/min (A) (by C-G formula based on SCr of 1.37 mg/dL (H)).  Assessment: 78 y.o. male with h/o Afib, Eliquis PTA on hold given AKI on admission.  Pharmacy asked to initiate heparin.  Heparin use has been complicated by hematuria related to foley placement.  ECHO 9/15 revealed an LV thrombus.  Last dose of Eliquis was 9/10.  Would expect this to be cleared, however given AKI, lab results support patient may still have some Eliquis in his system that is influencing the accuracy of anti-Xa level monitoring of heparin.  aPTT this morning remains therapeutic (aPTT 83, goal of 66-102), Hgb/Hct trending up. Cardiology wants to transition to warfarin. INR is 1.2 this afternoon.  Goal of Therapy:  aPTT 66-102 Heparin level 0.3-0.7 units/ml Monitor platelets by anticoagulation  protocol: Yes   Plan:  -Warfarin 7.5mg  PO x1 tonight -Daily protime   Arrie Senate, PharmD, BCPS Clinical Pharmacist [phone] Please check AMION for all [place] numbers 08/30/2019

## 2019-08-30 NOTE — Progress Notes (Signed)
ANTICOAGULATION CONSULT NOTE   Pharmacy Consult for Heparin Indication: atrial fibrillation and LV Thrombus  Allergies  Allergen Reactions  . Tolterodine Swelling and Other (See Comments)    (Detrol) Dry mouth/dry throat, severe swelling of the legs, and started a "downward spiral"  . Lipitor [Atorvastatin] Other (See Comments)    Muscle aches  . Xarelto [Rivaroxaban] Rash    Patient Measurements: Height: 6\' 4"  (193 cm) Weight: 289 lb 3.9 oz (131.2 kg) IBW/kg (Calculated) : 86.8 Heparin Dosing Weight: 115 kg  Vital Signs: Temp: 97.9 F (36.6 C) (09/17 0804) Temp Source: Oral (09/17 0804) BP: 120/63 (09/17 0804) Pulse Rate: 95 (09/17 0804)  Labs: Recent Labs    08/28/19 0415 08/28/19 2056 08/29/19 0534 08/29/19 1635 08/30/19 0730  HGB  --   --  9.5*  --  10.1*  HCT  --   --  30.0*  --  31.0*  PLT  --   --  138*  --  132*  APTT  --   --  77* 86* 83*  HEPARINUNFRC  --  1.46* 1.14*  --  0.88*  CREATININE 2.34*  --  1.75*  --  1.37*    Estimated Creatinine Clearance: 65.7 mL/min (A) (by C-G formula based on SCr of 1.37 mg/dL (H)).  Assessment: 77 y.o. male with h/o Afib, Eliquis PTA on hold given AKI on admission.  Pharmacy asked to initiate heparin.  Heparin use has been complicated by hematuria related to foley placement.  ECHO 9/15 revealed an LV thrombus.  Last dose of Eliquis was 9/10.  Would expect this to be cleared, however given AKI, lab results support patient may still have some Eliquis in his system that is influencing the accuracy of anti-Xa level monitoring of heparin.  aPTT this morning remains therapeutic (aPTT 83, goal of 66-102), Hgb/Hct trending up.  Goal of Therapy:  aPTT 66-102 Heparin level 0.3-0.7 units/ml Monitor platelets by anticoagulation protocol: Yes   Plan:  Continue Heparin at 1250 units/hr. Will continue to monitor for any signs/symptoms of bleeding and will follow up with aPTT/HL in the a.m.   Thank you for allowing pharmacy to  be a part of this patient's care.  Alycia Rossetti, PharmD, BCPS Clinical Pharmacist Clinical phone for 08/30/2019: [phone] 08/30/2019 12:28 PM   **Pharmacist phone directory can now be found on [email or web address] (PW [email or web address]).  Listed under [place].

## 2019-08-31 DIAGNOSIS — I513 Intracardiac thrombosis, not elsewhere classified: Secondary | ICD-10-CM

## 2019-08-31 LAB — BASIC METABOLIC PANEL
Anion gap: 9 (ref 5–15)
BUN: 30 mg/dL — ABNORMAL HIGH (ref 8–23)
CO2: 27 mmol/L (ref 22–32)
Calcium: 9 mg/dL (ref 8.9–10.3)
Chloride: 102 mmol/L (ref 98–111)
Creatinine, Ser: 1.27 mg/dL — ABNORMAL HIGH (ref 0.61–1.24)
GFR calc Af Amer: >60 mL/min (ref >60)
GFR calc non Af Amer: 54 mL/min — ABNORMAL LOW (ref >60)
Glucose, Bld: 163 mg/dL — ABNORMAL HIGH (ref 70–99)
Potassium: 4.4 mmol/L (ref 3.5–5.1)
Sodium: 138 mmol/L (ref 135–145)

## 2019-08-31 LAB — MAGNESIUM: Magnesium: 1.4 mg/dL — ABNORMAL LOW (ref 1.7–2.4)

## 2019-08-31 LAB — CBC
HCT: 32.3 % — ABNORMAL LOW (ref 39.0–52.0)
Hemoglobin: 9.8 g/dL — ABNORMAL LOW (ref 13.0–17.0)
MCH: 29.3 pg (ref 26.0–34.0)
MCHC: 30.3 g/dL (ref 30.0–36.0)
MCV: 96.7 fL (ref 80.0–100.0)
Platelets: 177 10*3/uL (ref 150–400)
RBC: 3.34 MIL/uL — ABNORMAL LOW (ref 4.22–5.81)
RDW: 16.7 % — ABNORMAL HIGH (ref 11.5–15.5)
WBC: 7.2 10*3/uL (ref 4.0–10.5)
nRBC: 0 % (ref 0.0–0.2)

## 2019-08-31 LAB — PROTIME-INR
INR: 1.2 (ref 0.8–1.2)
Prothrombin Time: 15 seconds (ref 11.4–15.2)

## 2019-08-31 LAB — APTT: aPTT: 33 seconds (ref 24–36)

## 2019-08-31 LAB — GLUCOSE, CAPILLARY
Glucose-Capillary: 144 mg/dL — ABNORMAL HIGH (ref 70–99)
Glucose-Capillary: 167 mg/dL — ABNORMAL HIGH (ref 70–99)
Glucose-Capillary: 192 mg/dL — ABNORMAL HIGH (ref 70–99)
Glucose-Capillary: 90 mg/dL (ref 70–99)
Glucose-Capillary: 92 mg/dL (ref 70–99)

## 2019-08-31 LAB — HEPARIN LEVEL (UNFRACTIONATED): Heparin Unfractionated: 0.49 IU/mL (ref 0.30–0.70)

## 2019-08-31 MED ORDER — LORAZEPAM 2 MG/ML IJ SOLN
0.5000 mg | Freq: Four times a day (QID) | INTRAMUSCULAR | Status: DC | PRN
  Administered 2019-09-03 – 2019-09-04 (×3): 0.5 mg via INTRAVENOUS
  Filled 2019-08-31 (×3): qty 1

## 2019-08-31 MED ORDER — WARFARIN SODIUM 7.5 MG PO TABS
7.5000 mg | ORAL_TABLET | Freq: Once | ORAL | Status: AC
  Administered 2019-08-31: 7.5 mg via ORAL
  Filled 2019-08-31: qty 1

## 2019-08-31 MED ORDER — SODIUM CHLORIDE 0.9% FLUSH: 10.0000 mL | INTRAVENOUS | Status: DC | PRN

## 2019-08-31 NOTE — Progress Notes (Signed)
Progress Note  Patient Name: Charles [name] Date of Encounter: 08/31/2019  Primary Cardiologist:  Sanda Klein, MD  Subjective   More alert today, although a little confused. Reports that CPAP feels a lot better w added O2 and would like that at home.   Inpatient Medications    Scheduled Meds: . Chlorhexidine Gluconate Cloth  6 each Topical Daily  . feeding supplement (NEPRO CARB STEADY)  237 mL Oral TID BM  . finasteride  5 mg Oral Daily  . insulin aspart  0-9 Units Subcutaneous Q4H  . mometasone-formoterol  2 puff Inhalation BID  . torsemide  40 mg Oral Daily  . Warfarin - Pharmacist Dosing Inpatient   Does not apply q1800   Continuous Infusions: . sodium chloride 500 mL/hr at 08/28/19 0946  . sodium chloride    . heparin 1,250 Units/hr (08/31/19 0310)   PRN Meds: acetaminophen, albuterol, ondansetron **OR** ondansetron (ZOFRAN) IV, oxyCODONE   Vital Signs    Vitals:   08/30/19 2130 08/30/19 2138 08/31/19 0500 08/31/19 0856  BP: 120/65   104/64  Pulse: 84   92  Resp: 16   18  Temp: 98 F (36.7 C)   (!) 97.5 F (36.4 C)  TempSrc:    Axillary  SpO2:  97%  100%  Weight:   131.4 kg   Height:        Intake/Output Summary (Last 24 hours) at 08/31/2019 1029 Last data filed at 08/31/2019 0800 Gross per 24 hour  Intake 982.98 ml  Output 3050 ml  Net -2067.02 ml   Filed Weights   08/28/19 0200 08/30/19 0417 08/31/19 0500  Weight: 130.1 kg 131.2 kg 131.4 kg   Last Weight  Most recent update: 08/31/2019  7:28 AM   Weight  131.4 kg (289 lb 11 oz)           Weight change: 0.2 kg   Telemetry    Afib w adequate rate control, occ PVCs- Personally Reviewed  ECG    None today - Personally Reviewed  Physical Exam   General: Obese, well nourished, male in no acute distress Head: Eyes PERRLA Normocephalic and atraumatic Lungs: Clear bilaterally to auscultation. Heart: irregular S1 S2, without rub or gallop. No murmur.  Pulses are 2+ & equal. No JVD.  Abdomen: Bowel sounds are present, abdomen soft and non-tender without masses or  hernias noted. Msk: Normal strength and tone for age. Extremities: No clubbing, cyanosis. Legs wrapped, 1+ edema..    Skin:  No rashes or lesions noted. Neuro: grossly nonfocal, oriented to self, location, situation, but occasionally with strange statements suggesting confusion. Psych:  Good affect, responds appropriately   Labs    Hematology Recent Labs  Lab 08/29/19 0534 08/30/19 0730 08/31/19 0913  WBC 7.5 7.1 7.2  RBC 3.19* 3.29* 3.34*  HGB 9.5* 10.1* 9.8*  HCT 30.0* 31.0* 32.3*  MCV 94.0 94.2 96.7  MCH 29.8 30.7 29.3  MCHC 31.7 32.6 30.3  RDW 16.3* 16.4* 16.7*  PLT 138* 132* 177    Chemistry Recent Labs  Lab 08/25/19 0343 08/25/19 1730 08/26/19 0413  08/29/19 0534 08/30/19 0730 08/31/19 0913  NA 134* 134* 134*   < > 134* 135 138  K 4.9 4.4 4.3   < > 4.2 4.0 4.4  CL 95* 97* 98   < > 100 102 102  CO2 26 27 26    < > 23 22 27   GLUCOSE 111* 112* 104*   < > 145* 121* 163*  BUN 77*  49* 40*   < > 45* 37* 30*  CREATININE 4.01* 2.67* 2.17*   < > 1.75* 1.37* 1.27*  CALCIUM 8.5* 8.2* 8.3*   < > 9.1 8.9 9.0  ALBUMIN 3.0* 2.9* 2.9*  --   --   --   --   GFRNONAA 13* 22* 28*   < > 36* 49* 54*  GFRAA 16* 26* 33*   < > 42* 57* >60  ANIONGAP 13 10 10    < > 11 11 9    < > = values in this interval not displayed.     High Sensitivity Troponin:   Recent Labs  Lab 08/23/19 2245 08/24/19 0329  TROPONINIHS 45* 50*     BNP Recent Labs  Lab 08/25/19 0343  BNP 878.3*    Lab Results  Component Value Date   INR 1.2 08/31/2019   INR 1.2 08/30/2019   INR 2.3 (H) 08/23/2019     Radiology    Dg Humerus Left  Result Date: 08/27/2019 CLINICAL DATA:  Recent fall. Left humerus pain. EXAM: LEFT HUMERUS - 2+ VIEW COMPARISON:  None. FINDINGS: An impacted fracture of the left humeral neck is seen. No evidence of shoulder dislocation. Generalized osteopenia is demonstrated. Degenerative spurring of  acromioclavicular joint and distal acromion process noted. IMPRESSION: Impacted fracture of left humeral neck. Electronically Signed   By: Marlaine Hind M.D.   On: 08/27/2019 17:17     Cardiac Studies   ECHO:  08/29/2019  1. Mild dyskinesis of the left ventricular, entire apical segment.  2. Apical left ventricular aneurysm.  3. Moderate, fixed thrombus on the apical wall of the left ventricle.  4. The left ventricle has mildly reduced systolic function, with an ejection fraction of 45-50%. The cavity size was normal. Left ventricular diastolic function could not be evaluated secondary to atrial fibrillation. There is right ventricular volume  and pressure overload.  5. The right ventricle has severely reduced systolic function. The cavity was severely enlarged. There is no increase in right ventricular wall thickness. Right ventricular systolic pressure is moderately elevated.  6. Left atrial size was moderately dilated.  7. Right atrial size was severely dilated.  8. Mild mitral valve prolapse, probably due to left heart "underfilling".  9. The mitral valve is grossly normal. Mild thickening of the mitral valve leaflet. There is mild to moderate mitral annular calcification present. 10. The tricuspid valve is grossly normal. Tricuspid valve regurgitation is moderate-severe. 11. Mild thickening of the aortic valve. Sclerosis without any evidence of stenosis of the aortic valve. 12. The aorta is normal unless otherwise noted. 13. The inferior vena cava was dilated in size with <50% respiratory variability. 14. Evidence of right to left atrial level shunting detected by color flow Doppler. 15. When compared to the prior study: from 2018, there are multiple changes. The most striking abnormality is severe dilation and severe dysfunction of the right ventricle, with evidence of pressure and volume overload.     There is also evidence of a small left ventricular apical aneurysm with associated  fixed parietal thrombus. Although not previously described, there are findings that suggest this is a more chronic abnormality.     There is probably a small "stretch" patent foramen ovale with intermittent high velocity, but low volume right to left atrial shunting.  Patient Profile     78 y.o. male w/ hx CAD (MI /2000 with PTCA of pLAD, failed PCI of RCA 2013 and failed balloon angioplasty of RCA in 2017, now managed  medically), chronic diastolic heart failure, DM, OSA  on CPAP, COPD, hx of CVA, Afib pt has refused anticoagulation, hyperlipidemia, hard of hearing, legally blind, chronic leg edema, and venous stasis, was admitted 09/10 with shock (?septic + cardiogenic), acute resp failure and ARF. Cards saw 09/16 for LV thrombus  Assessment & Plan    1. LV apical thrombus and aneurysm - Echo results above - + WMA, but not cath candidate (probable LAD occlusion, timing unknown) - +RV dysfunction, w/ increased R heart pressures, 2nd COPD, OSA, Obesity, likely OHS - coumadin per pharmacy started 09/17 -   2. PAF - likely persistent, since in Afib on admit - rate is ok, not on any rate-control meds - currently on heparin, MD advise on starting coumadin and loading Plavix  Otherwise, per IM Principal Problem:   ARF (acute renal failure) ([name]) Active Problems:   CAD (coronary artery disease)   Type 2 diabetes, uncontrolled, with retinopathy ([name])   PAF (paroxysmal atrial fibrillation) ([name])   Shock ([name])   Hypotension   Lactic acidosis   Pneumonia due to infectious organism   CHF (congestive heart failure) ([name])   Persistent atrial fibrillation   Chronic cor pulmonale ([name])   PFO (patent foramen ovale)    Signed, Rosaria Ferries , PA-C 10:29 AM 08/31/2019 Pager: [phone]  I have seen and examined the patient along with Rosaria Ferries , PA-C.  I have reviewed the chart, notes and new data.  I agree with PA/NP's note.  Key new complaints: feels better w O2 Key examination  changes: morbidly obese, clear lungs, irregular rhythm, no murmurs. Net negative fluid balance.  Key new findings / data: INR 1.2, creatinine improved further to 1.27  PLAN: Continue warfarin loading for apical LV thrombus and persistent (permanent?) atrial fibrillation. Physical exam is a big challenge, but volume status appears OK, maybe slightly hypervolemic. Continue to monitor K and creat, adjust diuretic dose and K supplement accordingly. Will need institutional rehab and I am not sure that he will ever be safe to return to independent living.  Sanda Klein, MD, [place] [phone] 08/31/2019, 10:50 AM

## 2019-08-31 NOTE — TOC Initial Note (Signed)
Transition of Care [facility]) - Initial/Assessment Note    Patient Details  Name: Charles [name] MRN: [id number] Date of Birth: September 21, 1941  [facility]) CM/SW Contact:    Bartholomew Crews, RN Phone Number: [phone] 08/31/2019, 1:56 PM  Clinical Narrative:                 Spoke with patient and spouse at the bedside. PTA home with spouse with [facility] for PT, OT. Had recently been discharged from [facility], and is not open to returning to that facility. Spouse states that patient needs rehab before returning home. Preferences for facilities include [facility]. Spouse was insistent that facilities be provided with his supplemental insurance information as well. Will complete FL2 and fax out to facilities. CM to follow for transition of care needs.   Expected Discharge Plan: Skilled Nursing Facility Barriers to Discharge: Continued Medical Work up   Patient Goals and CMS Choice Patient states their goals for this hospitalization and ongoing recovery are:: needs rehab CMS Medicare.gov Compare Post Acute Care list provided to:: Patient Choice offered to / list presented to : Patient, Spouse  Expected Discharge Plan and Services Expected Discharge Plan: [place] In-house Referral: Clinical Social Work, [facility] Discharge Planning Services: CM Consult Post Acute Care Choice: [place] Living arrangements for the past 2 months: Single Family Home                                      Prior Living Arrangements/Services Living arrangements for the past 2 months: Single Family Home Lives with:: Self, Spouse Patient language and need for interpreter reviewed:: Yes        Need for Family Participation in Patient Care: Yes (Comment) Care giver support system in place?: Yes (comment) Current home services: DME, Home OT, Home PT Criminal Activity/Legal Involvement Pertinent to Current Situation/Hospitalization: No - Comment as  needed  Activities of Daily Living Home Assistive Devices/Equipment: None ADL Screening (condition at time of admission) Patient's cognitive ability adequate to safely complete daily activities?: No Is the patient deaf or have difficulty hearing?: Yes Does the patient have difficulty seeing, even when wearing glasses/contacts?: No Does the patient have difficulty concentrating, remembering, or making decisions?: Yes Patient able to express need for assistance with ADLs?: Yes Does the patient have difficulty dressing or bathing?: Yes Independently performs ADLs?: No Communication: Independent Dressing (OT): Needs assistance Is this a change from baseline?: Pre-admission baseline Does the patient have difficulty walking or climbing stairs?: Yes Weakness of Legs: Both Weakness of Arms/Hands: Both  Permission Sought/Granted Permission sought to share information with : Family Supports Permission granted to share information with : Yes, Verbal Permission Granted  Share Information with NAME: Jannie Wenrick     Permission granted to share info w Relationship: spouse  Permission granted to share info w Contact Information: [phone]  Emotional Assessment Appearance:: Appears stated age Attitude/Demeanor/Rapport: Engaged Affect (typically observed): Accepting Orientation: : Oriented to Self, Oriented to Place Alcohol / Substance Use: Not Applicable Psych Involvement: No (comment)  Admission diagnosis:  Lactic acidosis [E87.2] CHF (congestive heart failure) ([name]) [I50.9] Acute renal failure (ARF) ([name]) [N17.9] Acute cystitis with hematuria [N30.01] AKI (acute kidney injury) ([place]) [N17.9] Hypotension [I95.9] Patient Active Problem List   Diagnosis Date Noted  . CHF (congestive heart failure) ([place])   . Persistent atrial fibrillation   . Chronic cor pulmonale ([name])   .  PFO (patent foramen ovale)   . Pneumonia due to infectious organism   . Lactic acidosis   . ARF (acute renal  failure) ([place]) 08/23/2019  . Acute cystitis with hematuria   . Overdose 08/03/2019  . Urinary incontinence 05/25/2019  . Dysarthria 02/21/2019  . Humerus fracture 08/23/2018  . Chronic gingivitis 08/16/2017  . NSTEMI (non-ST elevated myocardial infarction) ([place]) 06/01/2017  . Scaly skin 05/16/2017  . C. difficile colitis 01/31/2017  . PAT (paroxysmal atrial tachycardia) ([place]) 01/15/2017  . HLD (hyperlipidemia) 01/15/2017  . Hypoglycemia 01/11/2017  . Demand ischemia of myocardium ([place])   . Diarrhea 01/10/2017  . Elevated troponin 01/10/2017  . Dehydration 01/10/2017  . Hypoglycemia due to type 2 diabetes mellitus ([place]) 01/10/2017  . Hypoglycemia associated with diabetes ([place]) 10/17/2016  . Chronic ulcer of left foot ([place]) 08/31/2016  . Acute on chronic diastolic congestive heart failure ([place]) 08/20/2016  . Venous stasis   . Lower extremity edema   . Hypercholesterolemia   . Diabetic neuropathy ([place])   . Unstable angina ([place]) 08/15/2016  . 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 Pressure ulcer 12/27/2015  . Gangrene of toe ([place]) 12/26/2015  . Acute renal failure (ARF) ([place]) 12/26/2015  . Subconjunctival hemorrhage 10/21/2015  . Cellulitis in diabetic foot ([place])   . Cellulitis 05/06/2015  . COPD exacerbation ([place]) 10/10/2014  . Hematuria 08/12/2014  . Hypokalemia 08/12/2014  . Urinary retention 08/03/2014  . Hypotension 07/26/2014  . COPD  07/26/2014  . Shock ([place]) 07/25/2014  . Rash and nonspecific skin eruption 02/13/2014  . PAF (paroxysmal atrial fibrillation) ([place]) 11/13/2013  . Morbid obesity ([place]) 05/24/2012  . Type 2 diabetes, uncontrolled, with retinopathy ([place]) 02/09/2012  . NEOPLASM OF UNCERTAIN BEHAVIOR OF SKIN 12/24/2010  . WEIGHT GAIN, ABNORMAL 02/02/2010  . TOBACCO USE, QUIT 02/02/2010  . 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 HYPOGONADISM 07/12/2007  . B12 deficiency 07/12/2007  . Essential hypertension 07/12/2007  . MYOCARDIAL  INFARCTION, HX OF 07/12/2007  . CAD (coronary artery disease) 07/12/2007  . History of CVA (cerebrovascular accident) 07/12/2007  . VENOUS INSUFFICIENCY, LEGS 07/12/2007  . SYMPTOM, MEMORY LOSS 07/12/2007  . DIVERTICULITIS, HX OF 07/12/2007   PCP:  Cassandria Anger, MD Pharmacy:   [facility], [place] [address] Phone: [phone] Fax: [phone]  [facility] [address] [place] [address] Phone: [phone] Fax: [phone]     Social Determinants of Health (SDOH) Interventions    Readmission Risk Interventions No flowsheet data found.

## 2019-08-31 NOTE — Progress Notes (Addendum)
[place] for Heparin ans Warfarin Indication: atrial fibrillation and LV Thrombus  Allergies  Allergen Reactions  . Tolterodine Swelling and Other (See Comments)    (Detrol) Dry mouth/dry throat, severe swelling of the legs, and started a "downward spiral"  . Lipitor [Atorvastatin] Other (See Comments)    Muscle aches  . Xarelto [Rivaroxaban] Rash    Patient Measurements: Height: 6\' 4"  (193 cm) Weight: 289 lb 11 oz (131.4 kg) IBW/kg (Calculated) : 86.8 Heparin Dosing Weight: 115 kg  Vital Signs: Temp: 97.5 F (36.4 C) (09/18 0856) Temp Source: Axillary (09/18 0856) BP: 104/64 (09/18 0856) Pulse Rate: 92 (09/18 0856)  Labs: Recent Labs    08/29/19 0534 08/29/19 1635 08/30/19 0730 08/30/19 1543 08/31/19 0913  HGB 9.5*  --  10.1*  --  9.8*  HCT 30.0*  --  31.0*  --  32.3*  PLT 138*  --  132*  --  177  APTT 77* 86* 83*  --  33  LABPROT  --   --   --  14.6 15.0  INR  --   --   --  1.2 1.2  HEPARINUNFRC 1.14*  --  0.88*  --  0.49  CREATININE 1.75*  --  1.37*  --  1.27*    Estimated Creatinine Clearance: 70.9 mL/min (A) (by C-G formula based on SCr of 1.27 mg/dL (H)).  Assessment: 78 y.o. male with h/o Afib, on Eliquis PTA.  Pt has also been diagnosed with LV thrombus this admission with plans to switch oral anticoagulation to warfarin.  Heparin use has been complicated by hematuria related to foley placement.    Heparin level is therapeutic on 1250 units/hr.   Appears apixaban has cleared and can now dose using heparin levels alone.  INR 1.2  Goal of Therapy:  Heparin level 0.3-0.7 units/ml  INR goal 2-3 Monitor platelets by anticoagulation protocol: Yes   Plan:  Continue heparin at 1250 units/hr. Repeat warfarin 7.5mg  PO x 1 tonight. Continue to check daily heparin level, INR, and CBC.   [employer], Pharm.D., BCPS Clinical Pharmacist Clinical phone for 08/31/2019 from 8:30-4:00 is [phone].  **Pharmacist phone  directory can now be found on [email or web address] ([email or web address]).  Listed under [place].  08/31/2019 12:33 PM

## 2019-08-31 NOTE — Evaluation (Signed)
Physical Therapy Evaluation Patient Details Name: Charles [name] MRN: [id number] DOB: 1941-06-16 Today's Date: 08/31/2019   History of Present Illness  78 yo male with onset of lasix overdose of 30 pills accidentally was admitted to manage his electrolytes and fluid.  PMHx:  R transmet amputation with cellulitis, L humerus fracture 01/2019, DM, PN, B12 defic, CAD, CHF, PAF, OSA, COPD, CVA, urinary incontinence  Clinical Impression  Pt was evaluated with OT stepping in to assist with transition to the chair.  Pt is up to walk with only a few sidesteps, with significant cues for posture and setting up the task needed in part from the loss of peripheral vision and cognition.  He is attended by his wife who is supportive of a rehab placement, although she reports a previous negative experience with a service.  See pt for acute therapy to work on attaining as much standing control as possible to shorten needs to stay in SNF.    Follow Up Recommendations SNF    Equipment Recommendations  None recommended by PT    Recommendations for Other Services       Precautions / Restrictions Precautions Precautions: Fall Precaution Comments: limited L shoulder ROM due to humerus fx 01/2019 Required Braces or Orthoses: Other Brace Other Brace: R foot shoe which pt does not wear Restrictions Weight Bearing Restrictions: No Other Position/Activity Restrictions: pt lacks peripheral vision      Mobility  Bed Mobility Overal bed mobility: Needs Assistance Bed Mobility: Supine to Sit     Supine to sit: Mod assist;HOB elevated     General bed mobility comments: assistance with R side to avoid painful L arm and pt fear that someone will injure it  Transfers Overall transfer level: Needs assistance Equipment used: Rolling walker (2 wheeled);2 person hand held assist Transfers: Sit to/from Stand Sit to Stand: Mod assist;+2 physical assistance;+2 safety/equipment;From elevated surface(pt is 6'4")          General transfer comment: powered up with gait belt and care to avoid LUE strain in any way, supported R knee during power up  Ambulation/Gait Ambulation/Gait assistance: Mod assist;+2 physical assistance;+2 safety/equipment Gait Distance (Feet): 4 Feet Assistive device: Rolling walker (2 wheeled) Gait Pattern/deviations: Step-to pattern;Wide base of support;Trunk flexed;Decreased stride length Gait velocity: decreased Gait velocity interpretation: <1.8 ft/sec, indicate of risk for recurrent falls General Gait Details: cues for controlling posture and for safety with walker, to cue steps with pt having restricted vision peripherally  Stairs            Wheelchair Mobility    Modified Rankin (Stroke Patients Only)       Balance Overall balance assessment: Needs assistance Sitting-balance support: Feet supported;Single extremity supported Sitting balance-Leahy Scale: Fair   Postural control: Posterior lean Standing balance support: Bilateral upper extremity supported;During functional activity Standing balance-Leahy Scale: Poor Standing balance comment: pt needs cues for upright posture and controlling knees to extend and fully stand, struggling with no DF mobility                              Pertinent Vitals/Pain Pain Assessment: Faces Faces Pain Scale: Hurts even more Pain Location: L shoulder and arm Pain Descriptors / Indicators: Grimacing Pain Intervention(s): Monitored during session;Repositioned;Limited activity within patient's tolerance    Home Living Family/patient expects to be discharged to:: Skilled nursing facility Living Arrangements: Spouse/significant other;Children Available Help at Discharge: Family;Available 24 hours/day Type of Home: House Home Access:  Ramped entrance     Home Layout: One level Home Equipment: [place] - 2 wheels;Walker - 4 wheels;Cane - single point;Wheelchair - manual Additional Comments: pt has been up on RW  walking prior to his hosp admission    Prior Function Level of Independence: Needs assistance   Gait / Transfers Assistance Needed: RW inside or wc for longer trips  ADL's / Homemaking Assistance Needed: wife helps with lower body care, pt can wash and dress upper body        Hand Dominance   Dominant Hand: Right    Extremity/Trunk Assessment   Upper Extremity Assessment Upper Extremity Assessment: Generalized weakness;LUE deficits/detail LUE Deficits / Details: humeral fracture on L Feb 2020 with residual pain and shoulder restricted ROM LUE: Unable to fully assess due to pain;Unable to fully assess due to immobilization LUE Coordination: decreased fine motor;decreased gross motor    Lower Extremity Assessment Lower Extremity Assessment: Generalized weakness;RLE deficits/detail;LLE deficits/detail RLE Deficits / Details: transmet amputation of foot RLE Coordination: decreased fine motor;decreased gross motor LLE Deficits / Details: great toe amputation LLE Coordination: decreased fine motor;decreased gross motor    Cervical / Trunk Assessment Cervical / Trunk Assessment: Other exceptions(restricted forward flexion in trunk)  Communication   Communication: HOH;Expressive difficulties(hears better on R ear )  Cognition Arousal/Alertness: Awake/alert Behavior During Therapy: Anxious Overall Cognitive Status: History of cognitive impairments - at baseline                                 General Comments: pt is not clear about history details      General Comments General comments (skin integrity, edema, etc.): pt has nearly stood on front of feet to take steps due to flexibility loss of DF and poor postural control, but can step to chair with dense verbal and tactile cues    Exercises     Assessment/Plan    PT Assessment Patient needs continued PT services  PT Problem List Decreased strength;Decreased range of motion;Decreased activity  tolerance;Decreased balance;Decreased mobility;Decreased coordination;Decreased knowledge of use of DME;Decreased cognition;Decreased safety awareness;Cardiopulmonary status limiting activity;Obesity;Decreased skin integrity;Pain       PT Treatment Interventions DME instruction;Gait training;Stair training;Functional mobility training;Therapeutic activities;Therapeutic exercise;Balance training;Neuromuscular re-education;Cognitive remediation;Patient/family education    PT Goals (Current goals can be found in the Care Plan section)  Acute Rehab PT Goals Patient Stated Goal: Wife wants pt to get a rehab placement PT Goal Formulation: With patient/family Potential to Achieve Goals: Fair    Frequency Min 2X/week   Barriers to discharge Inaccessible home environment;Decreased caregiver support ramped entrance at home but is two person assist to stand    Co-evaluation   Reason for Co-Treatment: For patient/therapist safety;Necessary to address cognition/behavior during functional activity   OT goals addressed during session: ADL's and self-care;Proper use of Adaptive equipment and DME       AM-PAC PT "6 Clicks" Mobility  Outcome Measure Help needed turning from your back to your side while in a flat bed without using bedrails?: A Lot Help needed moving from lying on your back to sitting on the side of a flat bed without using bedrails?: A Lot Help needed moving to and from a bed to a chair (including a wheelchair)?: A Lot Help needed standing up from a chair using your arms (e.g., wheelchair or bedside chair)?: A Lot Help needed to walk in hospital room?: A Lot Help needed climbing 3-5 steps with a railing? :  Total 6 Click Score: 11    End of Session Equipment Utilized During Treatment: Gait belt;Oxygen Activity Tolerance: Patient limited by fatigue;Treatment limited secondary to medical complications (Comment);Other (comment)(ROM of LE joints) Patient left: in chair;with call  bell/phone within reach;with chair alarm set;with family/visitor present Nurse Communication: Mobility status;Other (comment)(instruction for transfer back to bed with two nurses) PT Visit Diagnosis: Unsteadiness on feet (R26.81);Muscle weakness (generalized) (M62.81);Difficulty in walking, not elsewhere classified (R26.2)    Time: 1115-1209 PT Time Calculation (min) (ACUTE ONLY): 54 min   Charges:   PT Evaluation $PT Eval Moderate Complexity: 1 Mod PT Treatments $Gait Training: 8-22 mins $Therapeutic Activity: 8-22 mins       Ramond Dial 08/31/2019, 1:12 PM   Mee Hives, PT MS Acute Rehab Dept. Number: [place] and [place]

## 2019-08-31 NOTE — Progress Notes (Signed)
PROGRESS NOTE        PATIENT DETAILS Name: Charles [name] Age: 78 y.o. Sex: male Date of Birth: 07/25/1941 Admit Date: 08/23/2019 Admitting Physician Collene Gobble, MD QP:[id number], Evie Lacks, MD  Brief Narrative: Patient is a 78 y.o. male history of CAD, chronic diastolic heart failure, COPD, atrial fibrillation, history of CVA-who presented to the hospital for worsening shortness of breath, increasing lower extremity edema-patient was found to have acute hypoxemic respiratory failure in the setting of decompensated combined systolic/diastolic heart failure, acute renal failure.  Managed in the intensive care unit-required CRRT BiPAP support.  Further evaluation with an echo revealed LV thrombus.  Upon further stability-transferred to the hospitalist service on 9/15.  Subjective: Sleeping comfortably when I walked in-denies any chest pain or shortness of breath.  Assessment/Plan: Acute kidney injury: Likely hemodynamically mediated-briefly required CRRT while in the ICU-renal function has improved significantly-creatinine is now close to normal.  Nephrology has signed off   Acute hypoxemic respiratory failure: Likely secondary to decompensated combined systolic/diastolic heart failure in the setting of worsening renal function.  Briefly required BiPAP-required CRRT for volume removal-now on room air.  Volume status remains relatively stable-has been started on Demadex.   Acute on chronic systolic and diastolic heart failure (EF 45-50%) along with right ventricular failure: Volume status remains stable-has been resumed on Demadex.  Appreciate cardiology input.  LV thrombus: Remains on IV heparin and overlapping Coumadin-hematuria has markedly improved.  Once hematuria improves further-we will initiate Plavix as well as recommended by cardiology  PAF: On Eliquis prior to this hospital stay-currently on IV heparin and overlapping Coumadin  CAD: No anginal symptoms  currently-plans are to start Plavix over the next few days.  Hematuria: Hematuria has improved-continue overlapping heparin and Coumadin.    Cellulitis of the left hip/abdomen along with MRSA UTI: Afebrile-improved-has completed a course of 7-day has completed a 7-day course of IV vancomycin.  DM-2: CBGs stable-continue with SSI  Chronic fracture of the left humeral head: Orthopedics following-recommendations are for supportive care.  Debility/deconditioning: PT/OT eval-plans are for SNF on discharge  S/p right TMA  Obesity: Estimated body mass index is 35.26 kg/m as calculated from the following:   Height as of this encounter: 6\' 4"  (1.93 m).   Weight as of this encounter: 131.4 kg.    Diet: Diet Order            Diet Heart Room service appropriate? Yes; Fluid consistency: Thin  Diet effective now               DVT Prophylaxis: Full dose anticoagulation with Heparin  Code Status: Partial Code: DNI-no CPR-OK for meds  Family Communication: Spoke with spouse over phone on 9/17-we will update later today or tomorrow.  Barriers to discharge: Ongoing hematuria-remains on IV heparin/overlapping Coumadin with subtherapeutic INR-awaiting PT evaluation.  Disposition Plan: Remain inpatient-but will plan on Home health vs SNF on discharge.  Await PT eval  Antimicrobial agents: Anti-infectives (From admission, onward)   Start     Dose/Rate Route Frequency Ordered Stop   08/28/19 2200  vancomycin (VANCOCIN) 1,250 mg in sodium chloride 0.9 % 250 mL IVPB     1,250 mg 166.7 mL/hr over 90 Minutes Intravenous Every 24 hours 08/28/19 2144 08/30/19 2253   08/27/19 2100  ceFEPIme (MAXIPIME) 1 g in sodium chloride 0.9 % 100 mL IVPB  Status:  Discontinued     1 g 200 mL/hr over 30 Minutes Intravenous Every 24 hours 08/27/19 0729 08/27/19 1022   08/27/19 1800  vancomycin (VANCOCIN) 1,250 mg in sodium chloride 0.9 % 250 mL IVPB     1,250 mg 166.7 mL/hr over 90 Minutes Intravenous   Once 08/27/19 1750 08/27/19 1951   08/27/19 0726  vancomycin variable dose per unstable renal function (pharmacist dosing)  Status:  Discontinued      Does not apply See admin instructions 08/27/19 0726 08/29/19 1249   08/25/19 1600  vancomycin (VANCOCIN) 1,250 mg in sodium chloride 0.9 % 250 mL IVPB  Status:  Discontinued     1,250 mg 166.7 mL/hr over 90 Minutes Intravenous Every 24 hours 08/24/19 1803 08/27/19 0726   08/24/19 1815  ceFEPIme (MAXIPIME) 2 g in sodium chloride 0.9 % 100 mL IVPB  Status:  Discontinued     2 g 200 mL/hr over 30 Minutes Intravenous Every 12 hours 08/24/19 1803 08/27/19 0729   08/24/19 1545  ceFEPIme (MAXIPIME) 500 mg in dextrose 5 % 50 mL IVPB  Status:  Discontinued     500 mg 100 mL/hr over 30 Minutes Intravenous Every 24 hours 08/24/19 1537 08/24/19 1803   08/24/19 1545  vancomycin (VANCOCIN) 2,000 mg in sodium chloride 0.9 % 500 mL IVPB     2,000 mg 250 mL/hr over 120 Minutes Intravenous  Once 08/24/19 1537 08/24/19 1848   08/24/19 1537  vancomycin variable dose per unstable renal function (pharmacist dosing)  Status:  Discontinued      Does not apply See admin instructions 08/24/19 1537 08/24/19 1803   08/23/19 1730  cefTRIAXone (ROCEPHIN) 2 g in sodium chloride 0.9 % 100 mL IVPB  Status:  Discontinued     2 g 200 mL/hr over 30 Minutes Intravenous Every 24 hours 08/23/19 1721 08/24/19 1526   08/23/19 1730  doxycycline (VIBRAMYCIN) 100 mg in sodium chloride 0.9 % 250 mL IVPB     100 mg 125 mL/hr over 120 Minutes Intravenous  Once 08/23/19 1721 08/23/19 1956      Procedures:   CONSULTS:  cardiology, pulmonary/intensive care, nephrology and urology  Time spent: 25 minutes-Greater than 50% of this time was spent in counseling, explanation of diagnosis, planning of further management, and coordination of care.  MEDICATIONS: Scheduled Meds: . Chlorhexidine Gluconate Cloth  6 each Topical Daily  . feeding supplement (NEPRO CARB STEADY)  237 mL Oral TID  BM  . finasteride  5 mg Oral Daily  . insulin aspart  0-9 Units Subcutaneous Q4H  . mometasone-formoterol  2 puff Inhalation BID  . torsemide  40 mg Oral Daily  . Warfarin - Pharmacist Dosing Inpatient   Does not apply q1800   Continuous Infusions: . sodium chloride 500 mL/hr at 08/28/19 0946  . sodium chloride    . heparin 1,250 Units/hr (08/31/19 0310)   PRN Meds:.acetaminophen, albuterol, ondansetron **OR** ondansetron (ZOFRAN) IV, oxyCODONE   PHYSICAL EXAM: Vital signs: Vitals:   08/30/19 2130 08/30/19 2138 08/31/19 0500 08/31/19 0856  BP: 120/65   104/64  Pulse: 84   92  Resp: 16   18  Temp: 98 F (36.7 C)   (!) 97.5 F (36.4 C)  TempSrc:    Axillary  SpO2:  97%  100%  Weight:   131.4 kg   Height:       Filed Weights   08/28/19 0200 08/30/19 0417 08/31/19 0500  Weight: 130.1 kg 131.2 kg 131.4 kg   Body mass  index is 35.26 kg/m.   Gen Exam:Alert awake-not in any distress HEENT:atraumatic, normocephalic Chest: B/L clear to auscultation anteriorly CVS:S1S2 irregular Abdomen:soft non tender, non distended Extremities: Trace edema-s/p right TMA Neurology: Non focal Skin: no rash  I have personally reviewed following labs and imaging studies  LABORATORY DATA: CBC: Recent Labs  Lab 08/26/19 0413 08/27/19 0452 08/29/19 0534 08/30/19 0730 08/31/19 0913  WBC 9.6 8.7 7.5 7.1 7.2  HGB 9.4* 9.1* 9.5* 10.1* 9.8*  HCT 29.8* 29.4* 30.0* 31.0* 32.3*  MCV 95.2 96.7 94.0 94.2 96.7  PLT 179 156 138* 132* [id number]    Basic Metabolic Panel: Recent Labs  Lab 08/24/19 1237  08/25/19 0343 08/25/19 1730 08/26/19 0413 08/27/19 0452 08/28/19 0415 08/29/19 0534 08/30/19 0730 08/31/19 0913  NA  --    < > 134* 134* 134* 134* 134* 134* 135 138  K  --    < > 4.9 4.4 4.3 4.3 4.4 4.2 4.0 4.4  CL  --    < > 95* 97* 98 101 100 100 102 102  CO2  --    < > 26 27 26 27 25 23 22 27   GLUCOSE  --    < > 111* 112* 104* 123* 143* 145* 121* 163*  BUN  --    < > 77* 49* 40* 42* 49*  45* 37* 30*  CREATININE  --    < > 4.01* 2.67* 2.17* 2.22* 2.34* 1.75* 1.37* 1.27*  CALCIUM  --    < > 8.5* 8.2* 8.3* 8.7* 9.0 9.1 8.9 9.0  MG 2.0  --  2.1  --  2.2 2.2 2.1 2.0 1.7 1.4*  PHOS 5.7*  --  4.8* 3.4 2.6  --   --   --   --   --    < > = values in this interval not displayed.    GFR: Estimated Creatinine Clearance: 70.9 mL/min (A) (by C-G formula based on SCr of 1.27 mg/dL (H)).  Liver Function Tests: Recent Labs  Lab 08/25/19 0343 08/25/19 1730 08/26/19 0413  ALBUMIN 3.0* 2.9* 2.9*   No results for input(s): LIPASE, AMYLASE in the last 168 hours. No results for input(s): AMMONIA in the last 168 hours.  Coagulation Profile: Recent Labs  Lab 08/30/19 1543 08/31/19 0913  INR 1.2 1.2    Cardiac Enzymes: No results for input(s): CKTOTAL, CKMB, CKMBINDEX, TROPONINI in the last 168 hours.  BNP (last 3 results) No results for input(s): PROBNP in the last 8760 hours.  HbA1C: No results for input(s): HGBA1C in the last 72 hours.  CBG: Recent Labs  Lab 08/30/19 1638 08/30/19 2128 08/31/19 0037 08/31/19 0724 08/31/19 1113  GLUCAP 166* 131* 92 90 167*    Lipid Profile: No results for input(s): CHOL, HDL, LDLCALC, TRIG, CHOLHDL, LDLDIRECT in the last 72 hours.  Thyroid Function Tests: No results for input(s): TSH, T4TOTAL, FREET4, T3FREE, THYROIDAB in the last 72 hours.  Anemia Panel: No results for input(s): VITAMINB12, FOLATE, FERRITIN, TIBC, IRON, RETICCTPCT in the last 72 hours.  Urine analysis:    Component Value Date/Time   COLORURINE AMBER (A) 08/23/2019 2231   APPEARANCEUR HAZY (A) 08/23/2019 2231   LABSPEC 1.017 08/23/2019 2231   PHURINE 5.0 08/23/2019 2231   GLUCOSEU NEGATIVE 08/23/2019 2231   GLUCOSEU NEGATIVE 05/25/2019 1353   HGBUR MODERATE (A) 08/23/2019 2231   BILIRUBINUR NEGATIVE 08/23/2019 2231   KETONESUR NEGATIVE 08/23/2019 2231   PROTEINUR 100 (A) 08/23/2019 2231   UROBILINOGEN 1.0 05/25/2019 1353  NITRITE NEGATIVE 08/23/2019  2231   LEUKOCYTESUR SMALL (A) 08/23/2019 2231    Sepsis Labs: Lactic Acid, Venous    Component Value Date/Time   LATICACIDVEN 1.4 08/25/2019 0343    MICROBIOLOGY: Recent Results (from the past 240 hour(s))  SARS Coronavirus 2 [facility] order, Performed in [facility] hospital lab) Nasopharyngeal Nasopharyngeal Swab     Status: None   Collection Time: 08/23/19  5:16 PM   Specimen: Nasopharyngeal Swab  Result Value Ref Range Status   SARS Coronavirus 2 NEGATIVE NEGATIVE Final    Comment: (NOTE) If result is NEGATIVE SARS-CoV-2 target nucleic acids are NOT DETECTED. The SARS-CoV-2 RNA is generally detectable in upper and lower  respiratory specimens during the acute phase of infection. The lowest  concentration of SARS-CoV-2 viral copies this assay can detect is 250  copies / mL. A negative result does not preclude SARS-CoV-2 infection  and should not be used as the sole basis for treatment or other  patient management decisions.  A negative result may occur with  improper specimen collection / handling, submission of specimen other  than nasopharyngeal swab, presence of viral mutation(s) within the  areas targeted by this assay, and inadequate number of viral copies  (<250 copies / mL). A negative result must be combined with clinical  observations, patient history, and epidemiological information. If result is POSITIVE SARS-CoV-2 target nucleic acids are DETECTED. The SARS-CoV-2 RNA is generally detectable in upper and lower  respiratory specimens dur ing the acute phase of infection.  Positive  results are indicative of active infection with SARS-CoV-2.  Clinical  correlation with patient history and other diagnostic information is  necessary to determine patient infection status.  Positive results do  not rule out bacterial infection or co-infection with other viruses. If result is PRESUMPTIVE POSTIVE SARS-CoV-2 nucleic acids MAY BE PRESENT.   A presumptive positive result  was obtained on the submitted specimen  and confirmed on repeat testing.  While 2019 novel coronavirus  (SARS-CoV-2) nucleic acids may be present in the submitted sample  additional confirmatory testing may be necessary for epidemiological  and / or clinical management purposes  to differentiate between  SARS-CoV-2 and other Sarbecovirus currently known to infect humans.  If clinically indicated additional testing with an alternate test  methodology [id number]) is advised. The SARS-CoV-2 RNA is generally  detectable in upper and lower respiratory sp ecimens during the acute  phase of infection. The expected result is Negative. Fact Sheet for Patients:  StrictlyIdeas.no Fact Sheet for Healthcare Providers: BankingDealers.co.za This test is not yet approved or cleared by the [place] FDA and has been authorized for detection and/or diagnosis of SARS-CoV-2 by FDA under an Emergency Use Authorization (EUA).  This EUA will remain in effect (meaning this test can be used) for the duration of the COVID-19 declaration under Section 564(b)(1) of the Act, 21 U.S.C. section 360bbb-3(b)(1), unless the authorization is terminated or revoked sooner. Performed at [facility], [place] [address], [place], [place] [address]   Blood Culture (routine x 2)     Status: None   Collection Time: 08/23/19  5:26 PM   Specimen: BLOOD  Result Value Ref Range Status   Specimen Description BLOOD BLOOD LEFT FOREARM  Final   Special Requests   Final    BOTTLES DRAWN AEROBIC AND ANAEROBIC Blood Culture adequate volume   Culture   Final    NO GROWTH 5 DAYS Performed at [facility], [place] [address], [place], [place]  [id number]    Report Status 08/28/2019 FINAL  Final  Blood Culture (routine x 2)     Status: None   Collection Time: 08/23/19  6:02 PM   Specimen: BLOOD  Result Value Ref Range Status   Specimen Description BLOOD RIGHT ANTECUBITAL  Final    Special Requests   Final    BOTTLES DRAWN AEROBIC ONLY Blood Culture results may not be optimal due to an inadequate volume of blood received in culture bottles   Culture   Final    NO GROWTH 5 DAYS Performed at [facility], [place] [address] [place], [place] [address]    Report Status 08/28/2019 FINAL  Final  Urine culture     Status: Abnormal   Collection Time: 08/23/19  7:21 PM   Specimen: Urine, Random  Result Value Ref Range Status   Specimen Description URINE, RANDOM  Final   Special Requests   Final    NONE Performed at [facility], [place] [address] [place], [place] [address]    Culture (A)  Final    50,000 COLONIES/mL METHICILLIN RESISTANT STAPHYLOCOCCUS AUREUS   Report Status 08/26/2019 FINAL  Final   Organism ID, Bacteria METHICILLIN RESISTANT STAPHYLOCOCCUS AUREUS (A)  Final      Susceptibility   Methicillin resistant staphylococcus aureus - MIC*    CIPROFLOXACIN >=8 RESISTANT Resistant     GENTAMICIN <=0.5 SENSITIVE Sensitive     NITROFURANTOIN <=16 SENSITIVE Sensitive     OXACILLIN >=4 RESISTANT Resistant     TETRACYCLINE >=16 RESISTANT Resistant     VANCOMYCIN 1 SENSITIVE Sensitive     TRIMETH/SULFA <=10 SENSITIVE Sensitive     CLINDAMYCIN >=8 RESISTANT Resistant     RIFAMPIN <=0.5 SENSITIVE Sensitive     Inducible Clindamycin NEGATIVE Sensitive     * 50,000 COLONIES/mL METHICILLIN RESISTANT STAPHYLOCOCCUS AUREUS  MRSA PCR Screening     Status: Abnormal   Collection Time: 08/24/19  2:14 PM   Specimen: Nasal Mucosa; Nasopharyngeal  Result Value Ref Range Status   MRSA by PCR POSITIVE (A) NEGATIVE Final    Comment:        The GeneXpert MRSA Assay (FDA approved for NASAL specimens only), is one component of a comprehensive MRSA colonization surveillance program. It is not intended to diagnose MRSA infection nor to guide or monitor treatment for MRSA infections. RESULT CALLED TO, READ BACK BY AND VERIFIED WITH: Everlean Alstrom RN 15:40 08/24/19 ([email or web address])  Performed at [facility], [place] [address], [place], [place] [address]   Urine culture     Status: None   Collection Time: 08/24/19  2:39 PM   Specimen: Urine, Catheterized  Result Value Ref Range Status   Specimen Description URINE, CATHETERIZED  Final   Special Requests Normal  Final   Culture   Final    NO GROWTH Performed at [facility], [address], [place], [place] [address]    Report Status 08/25/2019 FINAL  Final    RADIOLOGY STUDIES/RESULTS: Dg Chest 1 View  Result Date: 08/24/2019 CLINICAL DATA:  78 year old male with central line placement. EXAM: CHEST  1 VIEW COMPARISON:  Chest radiograph dated 06/02 FINDINGS: Right IJ central venous line with tip close to the cavoatrial junction. No pneumothorax. There is a right pleural effusion with right lung base atelectasis. Infiltrate is not excluded. Clinical correlation is recommended. The left lung is clear. Stable cardiomegaly. Atherosclerotic calcification of the aortic arch. No acute osseous pathology. Osteopenia with degenerative changes of the  spine. Old left humeral neck fracture. IMPRESSION: 1. Right IJ central venous line with tip close to the cavoatrial junction. No pneumothorax. 2. Right pleural effusion with right lung base atelectasis versus infiltrate. Electronically Signed   By: Anner Crete M.D.   On: 08/24/2019 18:32   US Renal  Result Date: 08/24/2019 CLINICAL DATA:  Acute renal failure EXAM: RENAL / URINARY TRACT ULTRASOUND COMPLETE COMPARISON:  None. FINDINGS: Somewhat limited exam due to overlying bowel gas. Right Kidney: Renal measurements: 13.3 x 6.6 x 6.7 = volume: 316 mL . Echogenicity within normal limits. No mass or hydronephrosis visualized. Left Kidney: Renal measurements: 11.8 x 6.2 x 7.0 = volume: 266 mL. Echogenicity within normal limits. No mass or hydronephrosis visualized. Bladder: The bladder is partially distended there is a heterogeneous nodular appearance to the prostate which  protrudes into the posterior bladder. IMPRESSION: Somewhat limited exam due to overlying bowel gas, however normal appearing kidneys. Enlarged nodular prostate protruding into the posterior bladder. Electronically Signed   By: Prudencio Pair M.D.   On: 08/24/2019 02:18   Dg Chest Port 1 View  Result Date: 08/25/2019 CLINICAL DATA:  CHF EXAM: PORTABLE CHEST 1 VIEW COMPARISON:  Yesterday FINDINGS: Shortening of the right IJ catheter, tip near the SVC brachiocephalic confluence. Cardiomegaly with haziness of the right chest from pleural effusion. No superimposed Kerley lines or air bronchogram. IMPRESSION: 1. Shorter right IJ catheter, tip near the SVC brachiocephalic confluence. 2. Cardiomegaly and right pleural effusion that is unchanged. Electronically Signed   By: Monte Fantasia M.D.   On: 08/25/2019 07:54   Dg Chest Port 1 View  Result Date: 08/23/2019 CLINICAL DATA:  Dyspnea EXAM: PORTABLE CHEST 1 VIEW COMPARISON:  06/01/2017 FINDINGS: Cardiomegaly. Mild pulmonary vascular congestion. Calcific aortic knob. Right basilar opacity. No pneumothorax. Advanced degenerative changes of the shoulders. IMPRESSION: 1. Right basilar opacity which may reflect pleural effusion with atelectasis and/or pneumonia. 2. Cardiomegaly and pulmonary vascular congestion suggesting CHF. Electronically Signed   By: Davina Poke M.D.   On: 08/23/2019 17:12   Dg Humerus Left  Result Date: 08/27/2019 CLINICAL DATA:  Recent fall. Left humerus pain. EXAM: LEFT HUMERUS - 2+ VIEW COMPARISON:  None. FINDINGS: An impacted fracture of the left humeral neck is seen. No evidence of shoulder dislocation. Generalized osteopenia is demonstrated. Degenerative spurring of acromioclavicular joint and distal acromion process noted. IMPRESSION: Impacted fracture of left humeral neck. Electronically Signed   By: Marlaine Hind M.D.   On: 08/27/2019 17:17     LOS: 8 days   Oren Binet, MD  [facility]  If 7PM-7AM, please contact  night-coverage  Please page via [email or web address]  Go to [email or web address] and use [facility] universal password to access. If you do not have the password, please contact the hospital operator.  Locate the [facility] provider you are looking for under [facility] and page to a number that you can be directly reached. If you still have difficulty reaching the provider, please page the [facility] (Director on Call) for the Hospitalists listed on amion for assistance.  08/31/2019, 11:32 AM

## 2019-08-31 NOTE — Progress Notes (Signed)
Pt is assisted for ADL, all IADL and ambulates with a rollator at home. He presents with impaired cognition, weakness, L shoulder pain with minimal ability to use and poor standing balance. Pt will need SNF upon discharge. Will defer further OT to SNF.   08/31/19 1200  OT Visit Information  Last OT Received On 08/31/19  Assistance Needed +2  PT/OT/SLP Co-Evaluation/Treatment Yes  Reason for Co-Treatment For patient/therapist safety;Necessary to address cognition/behavior during functional activity  OT goals addressed during session ADL's and self-care;Proper use of Adaptive equipment and DME  History of Present Illness 78 year old man admitted 08/23/19 with CHF exacerbation and acute renal failure requiring CRRT and bipap in the ICU.  PMHx: R transmet amputation with cellulitis, L humerus fracture 01/2019, DM, PN, B12 defic, CAD, CHF, PAF, OSA, COPD, CVA, urinary incontinence  Precautions  Precautions Fall  Precaution Comments longstanding L shoulder pain  Restrictions  Weight Bearing Restrictions No  Home Living  Family/patient expects to be discharged to: [place] Spouse/significant other;Children  Available Help at Discharge Family;Available 24 hours/day  Type of [place] One level  Bathroom Shower/Tub Tub/shower unit  Tax adviser - 2 wheels;Walker - 4 wheels;Cane - single point;Wheelchair - manual  Additional Comments per prior chart entry  Prior Function  Level of Independence Needs assistance  Gait / Transfers Assistance Needed uses rollator to ambulate short distances  ADL's / Homemaking Assistance Needed sponge bathes with assist for LB, self feeds and grooms with set up, dependent in all IADL  Communication  Communication HOH  Pain Assessment  Pain Assessment Faces  Faces Pain Scale 6  Pain Location L shoulder  Pain Descriptors / Indicators Grimacing;Guarding   Pain Intervention(s) Monitored during session;Repositioned  Cognition  Arousal/Alertness Awake/alert  Behavior During Therapy Anxious  Overall Cognitive Status Impaired/Different from baseline  Area of Impairment Following commands;Safety/judgement;Problem solving;Attention;Memory;Orientation  Orientation Level Disoriented to;Situation;Time  Current Attention Level Focused  Memory Decreased short-term memory  Following Commands Follows one step commands with increased time (and multiple cues)  Safety/Judgement Decreased awareness of safety;Decreased awareness of deficits  Problem Solving Slow processing;Difficulty sequencing;Decreased initiation;Requires verbal cues;Requires tactile cues  General Comments pt with h/o impaired cognition  Upper Extremity Assessment  Upper Extremity Assessment LUE deficits/detail  LUE Deficits / Details pt with limited shoulder and pain due to chronic fx  LUE Coordination decreased gross motor  Lower Extremity Assessment  Lower Extremity Assessment Defer to PT evaluation  ADL  Overall ADL's  Needs assistance/impaired  Eating/Feeding Set up;Sitting  Grooming Minimal assistance;Sitting  Upper Body Bathing Minimal assistance;Sitting  Lower Body Bathing Total assistance;+2 for physical assistance;Sit to/from stand  Upper Body Dressing  Moderate assistance;Sitting  Lower Body Dressing Total assistance;+2 for physical assistance;Sit to/from Retail buyer +2 for physical assistance;Moderate assistance;Stand-pivot;RW  Toileting- Clothing Manipulation and Hygiene Total assistance;+2 for physical assistance;Sit to/from stand  Vision- Assessment  Additional Comments R field cut vs inattention  Bed Mobility  Overal bed mobility Needs Assistance  Bed Mobility Supine to Sit  Supine to sit Mod assist  Transfers  Overall transfer level Needs assistance  Equipment used Rolling walker (2 wheeled)  Transfers Sit to/from [employer] Transfers  Sit  to Stand +2 physical assistance;Mod assist  Stand pivot transfers +2 safety/equipment;Mod assist  General transfer comment from elevated bed, assisted hips with bed pad and use of gait belt. pt requiring constant verbal cues, attempting  to release walker and reach for chair before aligned  Balance  Overall balance assessment Needs assistance  Sitting balance-Leahy Scale Fair  Standing balance support Bilateral upper extremity supported;During functional activity  Standing balance-Leahy Scale Poor  OT - End of Session  Equipment Utilized During Treatment Gait belt;Rolling walker  Activity Tolerance Patient tolerated treatment well  Patient left in chair;with call bell/phone within reach;with chair alarm set;with family/visitor present  Nurse Communication Mobility status;Other (comment) (pain with urination, may not have a glucerna)  OT Assessment  OT Recommendation/Assessment All further OT needs can be met in the next venue of care  OT Visit Diagnosis Other abnormalities of gait and mobility (R26.89);Pain;Muscle weakness (generalized) (M62.81)  OT Problem List Decreased strength;Decreased activity tolerance;Impaired balance (sitting and/or standing);Decreased range of motion;Decreased coordination;Decreased safety awareness;Decreased knowledge of use of DME or AE;Decreased knowledge of precautions;Cardiopulmonary status limiting activity;Pain;Obesity;Impaired UE functional use  AM-PAC OT "6 Clicks" Daily Activity Outcome Measure (Version 2)  Help from another person eating meals? 3  Help from another person taking care of personal grooming? 3  Help from another person toileting, which includes using toliet, bedpan, or urinal? 1  Help from another person bathing (including washing, rinsing, drying)? 2  Help from another person to put on and taking off regular upper body clothing? 2  Help from another person to put on and taking off regular lower body clothing? 1  6 Click Score 12  OT  Recommendation  Follow Up Recommendations SNF;Supervision/Assistance - 24 hour  Individuals Consulted  Consulted and Agree with Results and Recommendations Family member/caregiver  Family Member Consulted wife  Acute Rehab OT Goals  Patient Stated Goal to go to rehab and get stronger   OT Time Calculation  OT Start Time (ACUTE ONLY) 1150  OT Stop Time (ACUTE ONLY) 1208  OT Time Calculation (min) 18 min  OT General Charges  $OT Visit 1 Visit  OT Evaluation  $OT Eval Moderate Complexity 1 Mod  Written Expression  Dominant Hand Right  Nestor Lewandowsky, OTR/L Acute Rehabilitation Services Pager: [phone] Office: [phone]

## 2019-08-31 NOTE — Care Management Important Message (Signed)
Important Message  Patient Details  Name: Charles [name] MRN: [id number] Date of Birth: 1941-04-16   Medicare Important Message Given:  Yes     Memory Argue 08/31/2019, 3:32 PM

## 2019-08-31 NOTE — NC FL2 (Signed)
[place] LEVEL OF CARE SCREENING TOOL     IDENTIFICATION  Patient Name: Charles [name] Birthdate: May 09, 1941 Sex: male Admission Date (Current Location): 08/23/2019  [facility] and [place] Number:  Herbalist and Address:  [facility] [place], [place] [address] [place], [place] [address]      Provider Number: [id number]  Attending Physician Name and Address:  Jonetta Osgood, MD  Relative Name and Phone Number:  Garbiel Leppanen (Spouse) [phone]    Current Level of Care: Hospital Recommended Level of Care: [place] Prior Approval Number:    Date Approved/Denied:   PASRR Number: [id number]  Discharge Plan: SNF    Current Diagnoses: Patient Active Problem List   Diagnosis Date Noted  . CHF (congestive heart failure) ([place])   . Persistent atrial fibrillation   . Chronic cor pulmonale ([name])   . PFO (patent foramen ovale)   . Pneumonia due to infectious organism   . Lactic acidosis   . ARF (acute renal failure) ([place]) 08/23/2019  . Acute cystitis with hematuria   . Overdose 08/03/2019  . Urinary incontinence 05/25/2019  . Dysarthria 02/21/2019  . Humerus fracture 08/23/2018  . Chronic gingivitis 08/16/2017  . NSTEMI (non-ST elevated myocardial infarction) ([place]) 06/01/2017  . Scaly skin 05/16/2017  . C. difficile colitis 01/31/2017  . PAT (paroxysmal atrial tachycardia) ([place]) 01/15/2017  . HLD (hyperlipidemia) 01/15/2017  . Hypoglycemia 01/11/2017  . Demand ischemia of myocardium ([place])   . Diarrhea 01/10/2017  . Elevated troponin 01/10/2017  . Dehydration 01/10/2017  . Hypoglycemia due to type 2 diabetes mellitus ([place]) 01/10/2017  . Hypoglycemia associated with diabetes ([place]) 10/17/2016  . Chronic ulcer of left foot ([place]) 08/31/2016  . Acute on chronic diastolic congestive heart failure ([place]) 08/20/2016  . Venous stasis   . Lower extremity edema   . Hypercholesterolemia   . Diabetic neuropathy ([place])   .  Unstable angina ([place]) 08/15/2016  . 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 Pressure ulcer 12/27/2015  . Gangrene of toe ([place]) 12/26/2015  . Acute renal failure (ARF) ([place]) 12/26/2015  . Subconjunctival hemorrhage 10/21/2015  . Cellulitis in diabetic foot ([place])   . Cellulitis 05/06/2015  . COPD exacerbation ([place]) 10/10/2014  . Hematuria 08/12/2014  . Hypokalemia 08/12/2014  . Urinary retention 08/03/2014  . Hypotension 07/26/2014  . COPD  07/26/2014  . Shock ([place]) 07/25/2014  . Rash and nonspecific skin eruption 02/13/2014  . PAF (paroxysmal atrial fibrillation) ([place]) 11/13/2013  . Morbid obesity ([place]) 05/24/2012  . Type 2 diabetes, uncontrolled, with retinopathy ([place]) 02/09/2012  . NEOPLASM OF UNCERTAIN BEHAVIOR OF SKIN 12/24/2010  . WEIGHT GAIN, ABNORMAL 02/02/2010  . TOBACCO USE, QUIT 02/02/2010  . DM type 2 causing neurological disease ([place]) 07/12/2007  . HYPOGONADISM 07/12/2007  . B12 deficiency 07/12/2007  . Essential hypertension 07/12/2007  . MYOCARDIAL INFARCTION, HX OF 07/12/2007  . CAD (coronary artery disease) 07/12/2007  . History of CVA (cerebrovascular accident) 07/12/2007  . VENOUS INSUFFICIENCY, LEGS 07/12/2007  . SYMPTOM, MEMORY LOSS 07/12/2007  . DIVERTICULITIS, HX OF 07/12/2007    Orientation RESPIRATION BLADDER Height & Weight     Self, Place  Normal Incontinent Weight: 131.4 kg Height:  6\' 4"  (193 cm)  BEHAVIORAL SYMPTOMS/MOOD NEUROLOGICAL BOWEL NUTRITION STATUS      Incontinent Diet  AMBULATORY STATUS COMMUNICATION OF NEEDS Skin   Extensive Assist Verbally Other (Comment)(discoloration and weeping to lower extremities)  Personal Care Assistance Level of Assistance  Bathing, Feeding, Dressing Bathing Assistance: Maximum assistance Feeding assistance: Limited assistance Dressing Assistance: Maximum assistance     Functional Limitations Info  Sight, Hearing, Speech Sight Info: Impaired Hearing Info: Impaired Speech  Info: Adequate    SPECIAL CARE FACTORS FREQUENCY  PT (By licensed PT), OT (By licensed OT)     PT Frequency: PT at SNF to eval and treat at least 5x/week OT Frequency: OT at SNF to eval and treat at least 5x/week            Contractures Contractures Info: Not present    Additional Factors Info  Code Status, Allergies Code Status Info: partial - call code blue, bipap, and ACLS meds only Allergies Info: Lipitor, Xarelto           Current Medications (08/31/2019):  This is the current hospital active medication list Current Facility-Administered Medications  Medication Dose Route Frequency Provider Last Rate Last Dose  . 0.9 %  sodium chloride infusion   Intravenous Continuous Audria Nine, DO 500 mL/hr at 08/28/19 0946    . 0.9 %  sodium chloride infusion  250 mL Intravenous Continuous Audria Nine, DO      . acetaminophen (TYLENOL) tablet 650 mg  650 mg Oral Q4H PRN Corey Harold, NP   650 mg at 08/27/19 1652  . albuterol (PROVENTIL) (2.5 MG/3ML) 0.083% nebulizer solution 2.5 mg  2.5 mg Inhalation Q6H PRN Audria Nine, DO      . Chlorhexidine Gluconate Cloth 2 % PADS 6 each  6 each Topical Daily Audria Nine, DO   6 each at 08/30/19 2127  . feeding supplement (NEPRO CARB STEADY) liquid 237 mL  237 mL Oral TID BM Audria Nine, DO   237 mL at 08/29/19 2156  . finasteride (PROSCAR) tablet 5 mg  5 mg Oral Daily Audria Nine, DO   5 mg at 08/31/19 0939  . heparin ADULT infusion 100 units/mL (25000 units/258mL sodium chloride 0.45%)  1,250 Units/hr Intravenous Continuous Swayze, Ava, DO 12.5 mL/hr at 08/31/19 0310 1,250 Units/hr at 08/31/19 0310  . insulin aspart (novoLOG) injection 0-9 Units  0-9 Units Subcutaneous Q4H Audria Nine, DO   2 Units at 08/31/19 1211  . LORazepam (ATIVAN) injection 0.5 mg  0.5 mg Intravenous Q6H PRN Jonetta Osgood, MD      . mometasone-formoterol [facility]) 100-5 MCG/ACT inhaler 2 puff  2 puff Inhalation BID Audria Nine, DO   2 puff at 08/29/19 2035  . ondansetron (ZOFRAN) tablet 4 mg  4 mg Oral Q6H PRN Audria Nine, DO       Or  . ondansetron [facility]) injection 4 mg  4 mg Intravenous Q6H PRN Audria Nine, DO      . oxyCODONE (Oxy IR/ROXICODONE) immediate release tablet 5 mg  5 mg Oral Q6H PRN Corey Harold, NP   5 mg at 08/31/19 1211  . torsemide (DEMADEX) tablet 40 mg  40 mg Oral Daily Croitoru, Mihai, MD   40 mg at 08/31/19 0939  . warfarin (COUMADIN) tablet 7.5 mg  7.5 mg Oral ONCE-1800 Hammons, Theone Murdoch, RPH      . Warfarin - Pharmacist Dosing Inpatient   Does not apply q1800 [facility]inar Grad [facility]         Discharge Medications: Please see discharge summary for a list of discharge medications.  Relevant Imaging Results:  Relevant Lab Results:   Additional Information SSN: [id number]  Bartholomew Crews, RN

## 2019-09-01 DIAGNOSIS — I4821 Permanent atrial fibrillation: Secondary | ICD-10-CM

## 2019-09-01 DIAGNOSIS — I25119 Atherosclerotic heart disease of native coronary artery with unspecified angina pectoris: Secondary | ICD-10-CM

## 2019-09-01 LAB — GLUCOSE, CAPILLARY
Glucose-Capillary: 117 mg/dL — ABNORMAL HIGH (ref 70–99)
Glucose-Capillary: 122 mg/dL — ABNORMAL HIGH (ref 70–99)
Glucose-Capillary: 123 mg/dL — ABNORMAL HIGH (ref 70–99)
Glucose-Capillary: 132 mg/dL — ABNORMAL HIGH (ref 70–99)
Glucose-Capillary: 156 mg/dL — ABNORMAL HIGH (ref 70–99)
Glucose-Capillary: 212 mg/dL — ABNORMAL HIGH (ref 70–99)

## 2019-09-01 LAB — CBC
HCT: 30.5 % — ABNORMAL LOW (ref 39.0–52.0)
Hemoglobin: 9.3 g/dL — ABNORMAL LOW (ref 13.0–17.0)
MCH: 29.4 pg (ref 26.0–34.0)
MCHC: 30.5 g/dL (ref 30.0–36.0)
MCV: 96.5 fL (ref 80.0–100.0)
Platelets: 168 10*3/uL (ref 150–400)
RBC: 3.16 MIL/uL — ABNORMAL LOW (ref 4.22–5.81)
RDW: 16.9 % — ABNORMAL HIGH (ref 11.5–15.5)
WBC: 7.4 10*3/uL (ref 4.0–10.5)
nRBC: 0 % (ref 0.0–0.2)

## 2019-09-01 LAB — BASIC METABOLIC PANEL
Anion gap: 11 (ref 5–15)
BUN: 29 mg/dL — ABNORMAL HIGH (ref 8–23)
CO2: 24 mmol/L (ref 22–32)
Calcium: 8.9 mg/dL (ref 8.9–10.3)
Chloride: 102 mmol/L (ref 98–111)
Creatinine, Ser: 1.15 mg/dL (ref 0.61–1.24)
GFR calc Af Amer: >60 mL/min (ref >60)
GFR calc non Af Amer: >60 mL/min (ref >60)
Glucose, Bld: 128 mg/dL — ABNORMAL HIGH (ref 70–99)
Potassium: 4 mmol/L (ref 3.5–5.1)
Sodium: 137 mmol/L (ref 135–145)

## 2019-09-01 LAB — PROTIME-INR
INR: 1.5 — ABNORMAL HIGH (ref 0.8–1.2)
Prothrombin Time: 17.5 seconds — ABNORMAL HIGH (ref 11.4–15.2)

## 2019-09-01 LAB — HEPARIN LEVEL (UNFRACTIONATED)
Heparin Unfractionated: 0.7 IU/mL (ref 0.30–0.70)
Heparin Unfractionated: 0.69 IU/mL (ref 0.30–0.70)
Heparin Unfractionated: 0.79 IU/mL — ABNORMAL HIGH (ref 0.30–0.70)

## 2019-09-01 LAB — MAGNESIUM: Magnesium: 1.3 mg/dL — ABNORMAL LOW (ref 1.7–2.4)

## 2019-09-01 LAB — APTT: aPTT: 54 seconds — ABNORMAL HIGH (ref 24–36)

## 2019-09-01 MED ORDER — PRAVASTATIN SODIUM 40 MG PO TABS
40.0000 mg | ORAL_TABLET | Freq: Every day | ORAL | Status: DC
  Administered 2019-09-01 – 2019-09-06 (×6): 40 mg via ORAL
  Filled 2019-09-01 (×6): qty 1

## 2019-09-01 MED ORDER — WARFARIN SODIUM 5 MG PO TABS
5.0000 mg | ORAL_TABLET | Freq: Once | ORAL | Status: AC
  Administered 2019-09-01: 5 mg via ORAL
  Filled 2019-09-01: qty 1

## 2019-09-01 MED ORDER — METOPROLOL TARTRATE 12.5 MG HALF TABLET
12.5000 mg | ORAL_TABLET | Freq: Two times a day (BID) | ORAL | Status: DC
  Administered 2019-09-01 – 2019-09-03 (×5): 12.5 mg via ORAL
  Filled 2019-09-01 (×5): qty 1

## 2019-09-01 NOTE — Progress Notes (Signed)
Patient declined CPAP at this time, no distress noted.  

## 2019-09-01 NOTE — Progress Notes (Addendum)
ANTICOAGULATION CONSULT NOTE-Follow up  Pharmacy Consult for Heparin ans Warfarin Indication: atrial fibrillation and LV Thrombus  Allergies  Allergen Reactions  . Tolterodine Swelling and Other (See Comments)    (Detrol) Dry mouth/dry throat, severe swelling of the legs, and started a "downward spiral"  . Lipitor [Atorvastatin] Other (See Comments)    Muscle aches  . Xarelto [Rivaroxaban] Rash    Patient Measurements: Height: 6\' 4"  (193 cm) Weight: 289 lb 11 oz (131.4 kg) IBW/kg (Calculated) : 86.8 Heparin Dosing Weight: 115 kg  Vital Signs: Temp: 98.2 F (36.8 C) (09/19 0338) Temp Source: Oral (09/19 0338) BP: 120/51 (09/19 0338) Pulse Rate: 80 (09/19 0338)  Labs: Recent Labs    08/30/19 0730 08/30/19 1543 08/31/19 0913 09/01/19 0646  HGB 10.1*  --  9.8* 9.3*  HCT 31.0*  --  32.3* 30.5*  PLT 132*  --  177 168  APTT 83*  --  33 54*  LABPROT  --  14.6 15.0 17.5*  INR  --  1.2 1.2 1.5*  HEPARINUNFRC 0.88*  --  0.49 0.70  CREATININE 1.37*  --  1.27* 1.15    Estimated Creatinine Clearance: 78.3 mL/min (by C-G formula based on SCr of 1.15 mg/dL).  Assessment: 78 y.o. male with h/o Afib, on Eliquis PTA.  Pt has also been diagnosed with LV thrombus this admission with plans to switch oral anticoagulation to warfarin.  Heparin use has been complicated by hematuria related to foley placement.    Heparin level is at the upper end of goal at 0.7 with rate at 1250 units/hr. CBC stable. Platelets 168. No bleeding or heparin infusion issues per the RN.   Bridging to warfarin. INR 1.5  Goal of Therapy:  Heparin level 0.3-0.7 units/ml  INR goal 2-3 Monitor platelets by anticoagulation protocol: Yes   Plan:  Decrease heparin rate to 1150 units/hr Check HL in 6 hours Give warfarin 5 mg x1 this evening Continue to check daily heparin level, INR, and CBC. Monitor for s/sx of bleeding  ADDENDUM: Heparin level at the upper end of goal at 0.69 with heparin rate at 1150  units/hr. No bleeding or infusion issues per the RN. Will decrease rate slightly to 1100 units/hr and recheck heparin level in 6 hours.   Agnes Lawrence, PharmD PGY1 Pharmacy Resident

## 2019-09-01 NOTE — Progress Notes (Signed)
ANTICOAGULATION CONSULT NOTE  Pharmacy Consult for Heparin Indication: atrial fibrillation and LV Thrombus  Allergies  Allergen Reactions  . Tolterodine Swelling and Other (See Comments)    (Detrol) Dry mouth/dry throat, severe swelling of the legs, and started a "downward spiral"  . Lipitor [Atorvastatin] Other (See Comments)    Muscle aches  . Xarelto [Rivaroxaban] Rash    Patient Measurements: Height: 6\' 4"  (193 cm) Weight: 289 lb 11 oz (131.4 kg) IBW/kg (Calculated) : 86.8 Heparin Dosing Weight: 115 kg  Vital Signs: Temp: 98.2 F (36.8 C) (09/19 2018) Temp Source: Oral (09/19 1729) BP: 106/69 (09/19 2018) Pulse Rate: 95 (09/19 2018)  Labs: Recent Labs    08/30/19 0730 08/30/19 1543 08/31/19 0913 09/01/19 0646 09/01/19 1502 09/01/19 2136  HGB 10.1*  --  9.8* 9.3*  --   --   HCT 31.0*  --  32.3* 30.5*  --   --   PLT 132*  --  177 168  --   --   APTT 83*  --  33 54*  --   --   LABPROT  --  14.6 15.0 17.5*  --   --   INR  --  1.2 1.2 1.5*  --   --   HEPARINUNFRC 0.88*  --  0.49 0.70 0.69 0.79*  CREATININE 1.37*  --  1.27* 1.15  --   --     Estimated Creatinine Clearance: 78.3 mL/min (by C-G formula based on SCr of 1.15 mg/dL).  Assessment: 78 y.o. male with history of Afib on Eliquis PTA.  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  Pharmacy consulted to dose IV heparin bridge to warfarin.   Heparin level is elevated despite rate reductions.  Confirmed with RN that lab was drawn appropriately.  No further hematuria reported.  Goal of Therapy:  Heparin level 0.3-0.7 units/ml  INR goal 2-3 Monitor platelets by anticoagulation protocol: Yes   Plan:  Reduce heparin gtt to 900 units/hr Check 8 hr heparin level  Turquoise Esch D. Mina Marble, PharmD, BCPS, [place] 09/01/2019, 10:37 PM

## 2019-09-01 NOTE — Progress Notes (Signed)
Patient refused CPAP. RT instructed patient to have RT called if he changes his mind. No distress noted.  RT will monitor as needed.

## 2019-09-01 NOTE — Progress Notes (Signed)
Progress Note  Patient Name: Charles [name] Date of Encounter: 09/01/2019  Primary Cardiologist: Sanda Klein, MD  Subjective   No palpitations or shortness of breath.  Inpatient Medications    Scheduled Meds: . Chlorhexidine Gluconate Cloth  6 each Topical Daily  . feeding supplement (NEPRO CARB STEADY)  237 mL Oral TID BM  . finasteride  5 mg Oral Daily  . insulin aspart  0-9 Units Subcutaneous Q4H  . mometasone-formoterol  2 puff Inhalation BID  . torsemide  40 mg Oral Daily  . Warfarin - Pharmacist Dosing Inpatient   Does not apply q1800   Continuous Infusions: . sodium chloride 500 mL/hr at 08/28/19 0946  . sodium chloride    . heparin 1,250 Units/hr (09/01/19 0601)   PRN Meds: acetaminophen, albuterol, LORazepam, ondansetron **OR** ondansetron (ZOFRAN) IV, oxyCODONE, sodium chloride flush   Vital Signs    Vitals:   08/31/19 0856 08/31/19 1632 08/31/19 1946 09/01/19 0338  BP: 104/64 118/60 (!) 102/59 (!) 120/51  Pulse: 92 72 72 80  Resp: 18 18 18 18   Temp: (!) 97.5 F (36.4 C) 97.8 F (36.6 C) 97.7 F (36.5 C) 98.2 F (36.8 C)  TempSrc: Axillary Oral Oral Oral  SpO2: 100% 100% 96% 94%  Weight:      Height:        Intake/Output Summary (Last 24 hours) at 09/01/2019 0818 Last data filed at 09/01/2019 0637 Gross per 24 hour  Intake 857.77 ml  Output 1850 ml  Net -992.23 ml   Filed Weights   08/28/19 0200 08/30/19 0417 08/31/19 0500  Weight: 130.1 kg 131.2 kg 131.4 kg    Telemetry    Atrial fibrillation.  Personally reviewed.  Physical Exam   GEN:  Obese male, no acute distress.   Neck: No JVD. Cardiac:  Irregularly irregular, no murmur, rub, or gallop.  Respiratory: Nonlabored. Clear to auscultation bilaterally. GI: Soft, nontender, bowel sounds present. MS: No edema; No deformity. Neuro:  Nonfocal.  Labs    Chemistry Recent Labs  Lab 08/25/19 1730 08/26/19 0413  08/30/19 0730 08/31/19 0913 09/01/19 0646  NA 134* 134*   < > 135  138 137  K 4.4 4.3   < > 4.0 4.4 4.0  CL 97* 98   < > 102 102 102  CO2 27 26   < > 22 27 24   GLUCOSE 112* 104*   < > 121* 163* 128*  BUN 49* 40*   < > 37* 30* 29*  CREATININE 2.67* 2.17*   < > 1.37* 1.27* 1.15  CALCIUM 8.2* 8.3*   < > 8.9 9.0 8.9  ALBUMIN 2.9* 2.9*  --   --   --   --   GFRNONAA 22* 28*   < > 49* 54* >60  GFRAA 26* 33*   < > 57* >60 >60  ANIONGAP 10 10   < > 11 9 11    < > = values in this interval not displayed.     Hematology Recent Labs  Lab 08/30/19 0730 08/31/19 0913 09/01/19 0646  WBC 7.1 7.2 7.4  RBC 3.29* 3.34* 3.16*  HGB 10.1* 9.8* 9.3*  HCT 31.0* 32.3* 30.5*  MCV 94.2 96.7 96.5  MCH 30.7 29.3 29.4  MCHC 32.6 30.3 30.5  RDW 16.4* 16.7* 16.9*  PLT 132* 177 168    Cardiac Enzymes Recent Labs  Lab 08/23/19 2245 08/24/19 0329  TROPONINIHS 45* 50*    Radiology    No results found.  Cardiac Studies  Echocardiogram 08/24/2019:  1. Mild dyskinesis of the left ventricular, entire apical segment.  2. Apical left ventricular aneurysm.  3. Moderate, fixed thrombus on the apical wall of the left ventricle.  4. The left ventricle has mildly reduced systolic function, with an ejection fraction of 45-50%. The cavity size was normal. Left ventricular diastolic function could not be evaluated secondary to atrial fibrillation. There is right ventricular volume  and pressure overload.  5. The right ventricle has severely reduced systolic function. The cavity was severely enlarged. There is no increase in right ventricular wall thickness. Right ventricular systolic pressure is moderately elevated.  6. Left atrial size was moderately dilated.  7. Right atrial size was severely dilated.  8. Mild mitral valve prolapse, probably due to left heart "underfilling".  9. The mitral valve is grossly normal. Mild thickening of the mitral valve leaflet. There is mild to moderate mitral annular calcification present. 10. The tricuspid valve is grossly normal. Tricuspid  valve regurgitation is moderate-severe. 11. Mild thickening of the aortic valve. Sclerosis without any evidence of stenosis of the aortic valve. 12. The aorta is normal unless otherwise noted. 13. The inferior vena cava was dilated in size with <50% respiratory variability. 14. Evidence of right to left atrial level shunting detected by color flow Doppler. 15. When compared to te prior study: from 2018, there are multiple changes. The most striking abnormality is severe dilation and severe dysfunction of the right ventricle, with evidence of pressure and volume overload.     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 Although not previously described, there are findings that suggest this is a more chronic abnormality.     There is probably a small "stretch" patent foramen ovale with intermittent high velocity, but low volume right to left atrial shunting.  Patient Profile     78 y.o. male with a history of CAD (MI /2000 with PTCA of pLAD, failed PCI of RCA 2013 and failed balloon angioplasty of RCA in 2017, now managed medically), chronic diastolic heart failure, DM, OSA on CPAP, COPD, hx of CVA, Afib pt has refused anticoagulation, hyperlipidemia, hard of hearing, legally blind, chronic leg edema, and venous stasis, was admitted 09/10 with shock (?septic + cardiogenic), acute resp failure and ARF.  Following for management of atrial fibrillation and LV mural thrombus.  Assessment & Plan    1.  Permanent atrial fibrillation.  CHADSVASC score is 8. Continues on telemetry monitoring.  Heart rate 80-100 range, currently not on AV nodal blockers.  He is on heparin bridge with Coumadin loading.  2.  Apical LV mural thrombus.  On heparin bridge with Coumadin loading.  INR currently 1.5.  3.  Acute on chronic combined heart failure.  Recent echocardiogram showed LVEF 45 to 50% range.  He continues with that diuresis, approximately 1700 cc  out more than last 24 hours.  On Demadex.  4.  Multivessel CAD, previously turned down for surgical revascularization to be managed medically.  No active angina symptoms.  Previously on Ranexa, Imdur, Lopressor, and Pravachol as an outpatient.  Reviewed most recent cardiology recommendations.  He continues on heparin and Coumadin with most recent INR 1.5.  Blood pressure has been stabilizing, will try and add low-dose Lopressor, would hold off on Imdur and Ranexa for now without active chest pain.  Resume Pravachol as well.  No change to Demadex dose, follow-up creatinine stable.  Signed, Rozann Lesches, MD  09/01/2019, 8:18 AM

## 2019-09-01 NOTE — Final Consult Note (Signed)
Consultant Final Sign-Off Note    Assessment/Final recommendations  Charles [name] is a 78 y.o. male followed by me for hematuria.  His urine clear and I will sign off.  The foley can either be removed when not needed for fluid management or he can be set up to see [place]  After discharge for reevaluation regarding foley status.    Wound care (if applicable):    Diet at discharge: per primary team   Activity at discharge: per primary team   Follow-up appointment:     Pending results:  Unresulted Labs (From admission, onward)    Start     Ordered   09/02/19 [id number]  Basic metabolic panel  Tomorrow morning,   R    Question:  Specimen collection method  Answer:  Unit=Unit collect   09/01/19 1108   09/01/19 1500  Heparin level (unfractionated)  Once-Timed,   TIMED    Question:  Specimen collection method  Answer:  Unit=Unit collect   09/01/19 0840   08/31/19 0500  Protime-INR  Daily,   R    Question:  Specimen collection method  Answer:  Unit=Unit collect   08/30/19 1442   08/30/19 0500  Heparin level (unfractionated)  Daily,   R    Question:  Specimen collection method  Answer:  Unit=Unit collect   08/28/19 2215   08/30/19 0500  APTT  Daily,   R    Question:  Specimen collection method  Answer:  Unit=Unit collect   08/29/19 1249   08/29/19 0500  CBC  Daily,   R    Question:  Specimen collection method  Answer:  Unit=Unit collect   08/28/19 1028   08/25/19 0500  Magnesium  Daily,   R     08/24/19 1750   08/24/19 0807  CBC with Differential/Platelet  ONCE - STAT,   STAT     08/24/19 0808           Medication recommendations:   Other recommendations:    Thank you for allowing [place] to participate in the care of your patient!  Please consult [place] again if you have further needs for your patient.  Irine Seal 09/01/2019 11:14 AM    Subjective     Objective  Vital signs in last 24 hours: Temp:  [97.7 F (36.5 C)-98.2 F (36.8 C)] 97.8 F (36.6 C) (09/19 0904) Pulse Rate:   [72-87] 87 (09/19 0904) Resp:  [18] 18 (09/19 0904) BP: (94-120)/(51-60) 94/53 (09/19 0904) SpO2:  [93 %-100 %] 93 % (09/19 0904)  General: Urine clear in foley bag.    Pertinent labs and Studies: Recent Labs    08/30/19 0730 08/31/19 0913 09/01/19 0646  WBC 7.1 7.2 7.4  HGB 10.1* 9.8* 9.3*  HCT 31.0* 32.3* 30.5*   BMET Recent Labs    08/31/19 0913 09/01/19 0646  NA 138 137  K 4.4 4.0  CL 102 102  CO2 27 24  GLUCOSE 163* 128*  BUN 30* 29*  CREATININE 1.27* 1.15  CALCIUM 9.0 8.9   No results for input(s): LABURIN in the last 72 hours. Results for orders placed or performed during the hospital encounter of 08/23/19  SARS Coronavirus 2 [facility] order, Performed in [facility] hospital lab) Nasopharyngeal Nasopharyngeal Swab     Status: None   Collection Time: 08/23/19  5:16 PM   Specimen: Nasopharyngeal Swab  Result Value Ref Range Status   SARS Coronavirus 2 NEGATIVE NEGATIVE Final    Comment: (NOTE) If result is NEGATIVE SARS-CoV-2  target nucleic acids are NOT DETECTED. The SARS-CoV-2 RNA is generally detectable in upper and lower  respiratory specimens during the acute phase of infection. The lowest  concentration of SARS-CoV-2 viral copies this assay can detect is 250  copies / mL. A negative result does not preclude SARS-CoV-2 infection  and should not be used as the sole basis for treatment or other  patient management decisions.  A negative result may occur with  improper specimen collection / handling, submission of specimen other  than nasopharyngeal swab, presence of viral mutation(s) within the  areas targeted by this assay, and inadequate number of viral copies  (<250 copies / mL). A negative result must be combined with clinical  observations, patient history, and epidemiological information. If result is POSITIVE SARS-CoV-2 target nucleic acids are DETECTED. The SARS-CoV-2 RNA is generally detectable in upper and lower  respiratory specimens  dur ing the acute phase of infection.  Positive  results are indicative of active infection with SARS-CoV-2.  Clinical  correlation with patient history and other diagnostic information is  necessary to determine patient infection status.  Positive results do  not rule out bacterial infection or co-infection with other viruses. If result is PRESUMPTIVE POSTIVE SARS-CoV-2 nucleic acids MAY BE PRESENT.   A presumptive positive result was obtained on the submitted specimen  and confirmed on repeat testing.  While 2019 novel coronavirus  (SARS-CoV-2) nucleic acids may be present in the submitted sample  additional confirmatory testing may be necessary for epidemiological  and / or clinical management purposes  to differentiate between  SARS-CoV-2 and other Sarbecovirus currently known to infect humans.  If clinically indicated additional testing with an alternate test  methodology [phone]) is advised. The SARS-CoV-2 RNA is generally  detectable in upper and lower respiratory sp ecimens during the acute  phase of infection. The expected result is Negative. Fact Sheet for Patients:  StrictlyIdeas.no Fact Sheet for Healthcare Providers: BankingDealers.co.za This test is not yet approved or cleared by the [place] FDA and has been authorized for detection and/or diagnosis of SARS-CoV-2 by FDA under an Emergency Use Authorization (EUA).  This EUA will remain in effect (meaning this test can be used) for the duration of the COVID-19 declaration under Section 564(b)(1) of the Act, 21 U.S.C. section 360bbb-3(b)(1), unless the authorization is terminated or revoked sooner. Performed at [facility], [place] [address], [place], [place] [address]   Blood Culture (routine x 2)     Status: None   Collection Time: 08/23/19  5:26 PM   Specimen: BLOOD  Result Value Ref Range Status   Specimen Description BLOOD BLOOD LEFT FOREARM  Final    Special Requests   Final    BOTTLES DRAWN AEROBIC AND ANAEROBIC Blood Culture adequate volume   Culture   Final    NO GROWTH 5 DAYS Performed at [facility], [place] [address], [place],  [address]    Report Status 08/28/2019 FINAL  Final  Blood Culture (routine x 2)     Status: None   Collection Time: 08/23/19  6:02 PM   Specimen: BLOOD  Result Value Ref Range Status   Specimen Description BLOOD RIGHT ANTECUBITAL  Final   Special Requests   Final    BOTTLES DRAWN AEROBIC ONLY Blood Culture results may not be optimal due to an inadequate volume of blood received in culture bottles   Culture   Final    NO GROWTH 5 DAYS Performed at [facility],  [address], [place], [place] [address]    Report Status 08/28/2019 FINAL  Final  Urine culture     Status: Abnormal   Collection Time: 08/23/19  7:21 PM   Specimen: Urine, Random  Result Value Ref Range Status   Specimen Description URINE, RANDOM  Final   Special Requests   Final    NONE Performed at [facility], [place] [address] [place], [place] [address]    Culture (A)  Final    50,000 COLONIES/mL METHICILLIN RESISTANT STAPHYLOCOCCUS AUREUS   Report Status 08/26/2019 FINAL  Final   Organism ID, Bacteria METHICILLIN RESISTANT STAPHYLOCOCCUS AUREUS (A)  Final      Susceptibility   Methicillin resistant staphylococcus aureus - MIC*    CIPROFLOXACIN >=8 RESISTANT Resistant     GENTAMICIN <=0.5 SENSITIVE Sensitive     NITROFURANTOIN <=16 SENSITIVE Sensitive     OXACILLIN >=4 RESISTANT Resistant     TETRACYCLINE >=16 RESISTANT Resistant     VANCOMYCIN 1 SENSITIVE Sensitive     TRIMETH/SULFA <=10 SENSITIVE Sensitive     CLINDAMYCIN >=8 RESISTANT Resistant     RIFAMPIN <=0.5 SENSITIVE Sensitive     Inducible Clindamycin NEGATIVE Sensitive     * 50,000 COLONIES/mL METHICILLIN RESISTANT STAPHYLOCOCCUS AUREUS  MRSA PCR Screening     Status: Abnormal   Collection Time: 08/24/19  2:14 PM   Specimen: Nasal Mucosa;  Nasopharyngeal  Result Value Ref Range Status   MRSA by PCR POSITIVE (A) NEGATIVE Final    Comment:        The GeneXpert MRSA Assay (FDA approved for NASAL specimens only), is one component of a comprehensive MRSA colonization surveillance program. It is not intended to diagnose MRSA infection nor to guide or monitor treatment for MRSA infections. RESULT CALLED TO, READ BACK BY AND VERIFIED WITH: Everlean Alstrom RN 15:40 08/24/19 ([email or web address]) Performed at [facility], [place] [address], [place], [place] [address]   Urine culture     Status: None   Collection Time: 08/24/19  2:39 PM   Specimen: Urine, Catheterized  Result Value Ref Range Status   Specimen Description URINE, CATHETERIZED  Final   Special Requests Normal  Final   Culture   Final    NO GROWTH Performed at [facility], [address] [place],  [address]    Report Status 08/25/2019 FINAL  Final    Imaging: No results found.

## 2019-09-01 NOTE — Progress Notes (Signed)
PROGRESS NOTE        PATIENT DETAILS Name: Charles [name] Age: 78 y.o. Sex: male Date of Birth: [birth year] Admit Date: 08/23/2019 Admitting Physician Collene Gobble, MD QP:[id number], Evie Lacks, MD  Brief Narrative: Patient is a 78 y.o. male history of CAD, chronic diastolic heart failure, COPD, atrial fibrillation, history of CVA-who presented to the hospital for worsening shortness of breath, increasing lower extremity edema-patient was found to have acute hypoxemic respiratory failure in the setting of decompensated combined systolic/diastolic heart failure, acute renal failure.  Managed in the intensive care unit-required CRRT BiPAP support.  Further evaluation with an echo revealed LV thrombus.  Upon further stability-transferred to the hospitalist service on 9/15.  Subjective: Sleeping comfortably when I walked in-denies any chest pain or shortness of breath.  Assessment/Plan: Acute kidney injury: Likely hemodynamically mediated-briefly required CRRT while in the ICU-renal function has improved significantly-creatinine is now close to normal.  Nephrology has signed off   Acute hypoxemic respiratory failure: Likely secondary to decompensated combined systolic/diastolic heart failure in the setting of worsening renal function.  Briefly required BiPAP-required CRRT for volume removal-now on room air.  Volume status remains relatively stable-has been started on Demadex.   Acute on chronic systolic and diastolic heart failure (EF 45-50%) along with right ventricular failure: Volume status remains stable-has been resumed on Demadex.  Appreciate cardiology input.  LV thrombus: Remains on IV heparin and overlapping Coumadin-INR remains subtherapeutic, although hematuria has improved-still having some amount of dark-colored urine.  Once hematuria resolves/improves further-we will initiate Plavix as recommended by cardiology.    PAF: On Eliquis prior to this hospital  stay-currently on IV heparin and overlapping Coumadin  CAD: No anginal symptoms currently-plans are to start Plavix over the next few days.  Hematuria/acute urinary retention: Hematuria has improved-Foley remains in place-we will need to discuss with urology over the next few days.  In the meantime-cautiously continue with overlapping heparin and Coumadin.    Cellulitis of the left hip/abdomen along with MRSA UTI: Afebrile-improved-has completed a course of 7-day has completed a 7-day course of IV vancomycin.  DM-2: CBGs stable-continue with SSI  Chronic fracture of the left humeral head: Orthopedics following-recommendations are for supportive care.  Debility/deconditioning: PT/OT eval-plans are for SNF on discharge  S/p right TMA  Obesity: Estimated body mass index is 35.26 kg/m as calculated from the following:   Height as of this encounter: 6\' 4"  (1.93 m).   Weight as of this encounter: 131.4 kg.    Diet: Diet Order            Diet Heart Room service appropriate? Yes; Fluid consistency: Thin  Diet effective now               DVT Prophylaxis: Full dose anticoagulation with Heparin  Code Status: Partial Code: DNI-no CPR-OK for meds  Family Communication: Left voicemail for spouse  Barriers to discharge: Ongoing hematuria-remains on IV heparin/overlapping Coumadin with subtherapeutic INR  Disposition Plan: Remain inpatient-but will plan on Home health vs SNF on discharge.   Antimicrobial agents: Anti-infectives (From admission, onward)   Start     Dose/Rate Route Frequency Ordered Stop   08/28/19 2200  vancomycin (VANCOCIN) 1,250 mg in sodium chloride 0.9 % 250 mL IVPB     1,250 mg 166.7 mL/hr over 90 Minutes Intravenous Every 24 hours 08/28/19 2144 08/30/19 2253  08/27/19 2100  ceFEPIme (MAXIPIME) 1 g in sodium chloride 0.9 % 100 mL IVPB  Status:  Discontinued     1 g 200 mL/hr over 30 Minutes Intravenous Every 24 hours 08/27/19 0729 08/27/19 1022   08/27/19  1800  vancomycin (VANCOCIN) 1,250 mg in sodium chloride 0.9 % 250 mL IVPB     1,250 mg 166.7 mL/hr over 90 Minutes Intravenous  Once 08/27/19 1750 08/27/19 1951   08/27/19 0726  vancomycin variable dose per unstable renal function (pharmacist dosing)  Status:  Discontinued      Does not apply See admin instructions 08/27/19 0726 08/29/19 1249   08/25/19 1600  vancomycin (VANCOCIN) 1,250 mg in sodium chloride 0.9 % 250 mL IVPB  Status:  Discontinued     1,250 mg 166.7 mL/hr over 90 Minutes Intravenous Every 24 hours 08/24/19 1803 08/27/19 0726   08/24/19 1815  ceFEPIme (MAXIPIME) 2 g in sodium chloride 0.9 % 100 mL IVPB  Status:  Discontinued     2 g 200 mL/hr over 30 Minutes Intravenous Every 12 hours 08/24/19 1803 08/27/19 0729   08/24/19 1545  ceFEPIme (MAXIPIME) 500 mg in dextrose 5 % 50 mL IVPB  Status:  Discontinued     500 mg 100 mL/hr over 30 Minutes Intravenous Every 24 hours 08/24/19 1537 08/24/19 1803   08/24/19 1545  vancomycin (VANCOCIN) 2,000 mg in sodium chloride 0.9 % 500 mL IVPB     2,000 mg 250 mL/hr over 120 Minutes Intravenous  Once 08/24/19 1537 08/24/19 1848   08/24/19 1537  vancomycin variable dose per unstable renal function (pharmacist dosing)  Status:  Discontinued      Does not apply See admin instructions 08/24/19 1537 08/24/19 1803   08/23/19 1730  cefTRIAXone (ROCEPHIN) 2 g in sodium chloride 0.9 % 100 mL IVPB  Status:  Discontinued     2 g 200 mL/hr over 30 Minutes Intravenous Every 24 hours 08/23/19 1721 08/24/19 1526   08/23/19 1730  doxycycline (VIBRAMYCIN) 100 mg in sodium chloride 0.9 % 250 mL IVPB     100 mg 125 mL/hr over 120 Minutes Intravenous  Once 08/23/19 1721 08/23/19 1956      Procedures:   CONSULTS:  cardiology, pulmonary/intensive care, nephrology and urology  Time spent: 25 minutes-Greater than 50% of this time was spent in counseling, explanation of diagnosis, planning of further management, and coordination of care.  MEDICATIONS:  Scheduled Meds: . Chlorhexidine Gluconate Cloth  6 each Topical Daily  . feeding supplement (NEPRO CARB STEADY)  237 mL Oral TID BM  . finasteride  5 mg Oral Daily  . insulin aspart  0-9 Units Subcutaneous Q4H  . metoprolol tartrate  12.5 mg Oral BID  . mometasone-formoterol  2 puff Inhalation BID  . pravastatin  40 mg Oral q1800  . torsemide  40 mg Oral Daily  . warfarin  5 mg Oral ONCE-1800  . Warfarin - Pharmacist Dosing Inpatient   Does not apply q1800   Continuous Infusions: . sodium chloride 500 mL/hr at 08/28/19 0946  . sodium chloride    . heparin 1,150 Units/hr (09/01/19 0843)   PRN Meds:.acetaminophen, albuterol, LORazepam, ondansetron **OR** ondansetron (ZOFRAN) IV, oxyCODONE, sodium chloride flush   PHYSICAL EXAM: Vital signs: Vitals:   08/31/19 1946 09/01/19 0338 09/01/19 0850 09/01/19 0904  BP: (!) 102/59 (!) 120/51  (!) 94/53  Pulse: 72 80  87  Resp: 18 18  18   Temp: 97.7 F (36.5 C) 98.2 F (36.8 C)  97.8 F (36.6  C)  TempSrc: Oral Oral  Oral  SpO2: 96% 94% 93% 93%  Weight:      Height:       Filed Weights   08/28/19 0200 08/30/19 0417 08/31/19 0500  Weight: 130.1 kg 131.2 kg 131.4 kg   Body mass index is 35.26 kg/m.   Gen Exam:Alert awake-not in any distress HEENT:atraumatic, normocephalic Chest: B/L clear to auscultation anteriorly CVS:S1S2 regular Abdomen:soft non tender, non distended Extremities:no edema Neurology: Non focal Skin: no rash  I have personally reviewed following labs and imaging studies  LABORATORY DATA: CBC: Recent Labs  Lab 08/27/19 0452 08/29/19 0534 08/30/19 0730 08/31/19 0913 09/01/19 0646  WBC 8.7 7.5 7.1 7.2 7.4  HGB 9.1* 9.5* 10.1* 9.8* 9.3*  HCT 29.4* 30.0* 31.0* 32.3* 30.5*  MCV 96.7 94.0 94.2 96.7 96.5  PLT 156 138* 132* 177 [id number]    Basic Metabolic Panel: Recent Labs  Lab 08/25/19 1730 08/26/19 0413  08/28/19 0415 08/29/19 0534 08/30/19 0730 08/31/19 0913 09/01/19 0646  NA 134* 134*   < >  134* 134* 135 138 137  K 4.4 4.3   < > 4.4 4.2 4.0 4.4 4.0  CL 97* 98   < > 100 100 102 102 102  CO2 27 26   < > 25 23 22 27 24   GLUCOSE 112* 104*   < > 143* 145* 121* 163* 128*  BUN 49* 40*   < > 49* 45* 37* 30* 29*  CREATININE 2.67* 2.17*   < > 2.34* 1.75* 1.37* 1.27* 1.15  CALCIUM 8.2* 8.3*   < > 9.0 9.1 8.9 9.0 8.9  MG  --  2.2   < > 2.1 2.0 1.7 1.4* 1.3*  PHOS 3.4 2.6  --   --   --   --   --   --    < > = values in this interval not displayed.    GFR: Estimated Creatinine Clearance: 78.3 mL/min (by C-G formula based on SCr of 1.15 mg/dL).  Liver Function Tests: Recent Labs  Lab 08/25/19 1730 08/26/19 0413  ALBUMIN 2.9* 2.9*   No results for input(s): LIPASE, AMYLASE in the last 168 hours. No results for input(s): AMMONIA in the last 168 hours.  Coagulation Profile: Recent Labs  Lab 08/30/19 1543 08/31/19 0913 09/01/19 0646  INR 1.2 1.2 1.5*    Cardiac Enzymes: No results for input(s): CKTOTAL, CKMB, CKMBINDEX, TROPONINI in the last 168 hours.  BNP (last 3 results) No results for input(s): PROBNP in the last 8760 hours.  HbA1C: No results for input(s): HGBA1C in the last 72 hours.  CBG: Recent Labs  Lab 08/31/19 1607 08/31/19 1941 09/01/19 0011 09/01/19 0334 09/01/19 0722  GLUCAP 192* 144* 123* 117* 122*    Lipid Profile: No results for input(s): CHOL, HDL, LDLCALC, TRIG, CHOLHDL, LDLDIRECT in the last 72 hours.  Thyroid Function Tests: No results for input(s): TSH, T4TOTAL, FREET4, T3FREE, THYROIDAB in the last 72 hours.  Anemia Panel: No results for input(s): VITAMINB12, FOLATE, FERRITIN, TIBC, IRON, RETICCTPCT in the last 72 hours.  Urine analysis:    Component Value Date/Time   COLORURINE AMBER (A) 08/23/2019 2231   APPEARANCEUR HAZY (A) 08/23/2019 2231   LABSPEC 1.017 08/23/2019 2231   PHURINE 5.0 08/23/2019 2231   GLUCOSEU NEGATIVE 08/23/2019 2231   GLUCOSEU NEGATIVE 05/25/2019 1353   HGBUR MODERATE (A) 08/23/2019 2231   BILIRUBINUR  NEGATIVE 08/23/2019 [place] 08/23/2019 2231   PROTEINUR 100 (A) 08/23/2019 2231  UROBILINOGEN 1.0 05/25/2019 1353   NITRITE NEGATIVE 08/23/2019 2231   LEUKOCYTESUR SMALL (A) 08/23/2019 2231    Sepsis Labs: Lactic Acid, Venous    Component Value Date/Time   LATICACIDVEN 1.4 08/25/2019 0343    MICROBIOLOGY: Recent Results (from the past 240 hour(s))  SARS Coronavirus 2 [facility] order, Performed in [facility] hospital lab) Nasopharyngeal Nasopharyngeal Swab     Status: None   Collection Time: 08/23/19  5:16 PM   Specimen: Nasopharyngeal Swab  Result Value Ref Range Status   SARS Coronavirus 2 NEGATIVE NEGATIVE Final    Comment: (NOTE) If result is NEGATIVE SARS-CoV-2 target nucleic acids are NOT DETECTED. The SARS-CoV-2 RNA is generally detectable in upper and lower  respiratory specimens during the acute phase of infection. The lowest  concentration of SARS-CoV-2 viral copies this assay can detect is 250  copies / mL. A negative result does not preclude SARS-CoV-2 infection  and should not be used as the sole basis for treatment or other  patient management decisions.  A negative result may occur with  improper specimen collection / handling, submission of specimen other  than nasopharyngeal swab, presence of viral mutation(s) within the  areas targeted by this assay, and inadequate number of viral copies  (<250 copies / mL). A negative result must be combined with clinical  observations, patient history, and epidemiological information. If result is POSITIVE SARS-CoV-2 target nucleic acids are DETECTED. The SARS-CoV-2 RNA is generally detectable in upper and lower  respiratory specimens dur ing the acute phase of infection.  Positive  results are indicative of active infection with SARS-CoV-2.  Clinical  correlation with patient history and other diagnostic information is  necessary to determine patient infection status.  Positive results do  not rule  out bacterial infection or co-infection with other viruses. If result is PRESUMPTIVE POSTIVE SARS-CoV-2 nucleic acids MAY BE PRESENT.   A presumptive positive result was obtained on the submitted specimen  and confirmed on repeat testing.  While 2019 novel coronavirus  (SARS-CoV-2) nucleic acids may be present in the submitted sample  additional confirmatory testing may be necessary for epidemiological  and / or clinical management purposes  to differentiate between  SARS-CoV-2 and other Sarbecovirus currently known to infect humans.  If clinically indicated additional testing with an alternate test  methodology [phone]) is advised. The SARS-CoV-2 RNA is generally  detectable in upper and lower respiratory sp ecimens during the acute  phase of infection. The expected result is Negative. Fact Sheet for Patients:  StrictlyIdeas.no Fact Sheet for Healthcare Providers: BankingDealers.co.za This test is not yet approved or cleared by the [place] FDA and has been authorized for detection and/or diagnosis of SARS-CoV-2 by FDA under an Emergency Use Authorization (EUA).  This EUA will remain in effect (meaning this test can be used) for the duration of the COVID-19 declaration under Section 564(b)(1) of the Act, 21 U.S.C. section 360bbb-3(b)(1), unless the authorization is terminated or revoked sooner. Performed at [facility], [place] [address], [place], [place] [address]   Blood Culture (routine x 2)     Status: None   Collection Time: 08/23/19  5:26 PM   Specimen: BLOOD  Result Value Ref Range Status   Specimen Description BLOOD BLOOD LEFT FOREARM  Final   Special Requests   Final    BOTTLES DRAWN AEROBIC AND ANAEROBIC Blood Culture adequate volume   Culture   Final    NO GROWTH 5 DAYS Performed at [facility], [address] [place], [place] [address]    Report Status 08/28/2019 FINAL  Final  Blood Culture (routine x  2)     Status: None   Collection Time: 08/23/19  6:02 PM   Specimen: BLOOD  Result Value Ref Range Status   Specimen Description BLOOD RIGHT ANTECUBITAL  Final   Special Requests   Final    BOTTLES DRAWN AEROBIC ONLY Blood Culture results may not be optimal due to an inadequate volume of blood received in culture bottles   Culture   Final    NO GROWTH 5 DAYS Performed at [facility], [place] [address], [place], [place] [address]    Report Status 08/28/2019 FINAL  Final  Urine culture     Status: Abnormal   Collection Time: 08/23/19  7:21 PM   Specimen: Urine, Random  Result Value Ref Range Status   Specimen Description URINE, RANDOM  Final   Special Requests   Final    NONE Performed at [facility], [place] [address] [place], [place] [address]    Culture (A)  Final    50,000 COLONIES/mL METHICILLIN RESISTANT STAPHYLOCOCCUS AUREUS   Report Status 08/26/2019 FINAL  Final   Organism ID, Bacteria METHICILLIN RESISTANT STAPHYLOCOCCUS AUREUS (A)  Final      Susceptibility   Methicillin resistant staphylococcus aureus - MIC*    CIPROFLOXACIN >=8 RESISTANT Resistant     GENTAMICIN <=0.5 SENSITIVE Sensitive     NITROFURANTOIN <=16 SENSITIVE Sensitive     OXACILLIN >=4 RESISTANT Resistant     TETRACYCLINE >=16 RESISTANT Resistant     VANCOMYCIN 1 SENSITIVE Sensitive     TRIMETH/SULFA <=10 SENSITIVE Sensitive     CLINDAMYCIN >=8 RESISTANT Resistant     RIFAMPIN <=0.5 SENSITIVE Sensitive     Inducible Clindamycin NEGATIVE Sensitive     * 50,000 COLONIES/mL METHICILLIN RESISTANT STAPHYLOCOCCUS AUREUS  MRSA PCR Screening     Status: Abnormal   Collection Time: 08/24/19  2:14 PM   Specimen: Nasal Mucosa; Nasopharyngeal  Result Value Ref Range Status   MRSA by PCR POSITIVE (A) NEGATIVE Final    Comment:        The GeneXpert MRSA Assay (FDA approved for NASAL specimens only), is one component of a comprehensive MRSA colonization surveillance program. It is not intended to  diagnose MRSA infection nor to guide or monitor treatment for MRSA infections. RESULT CALLED TO, READ BACK BY AND VERIFIED WITH: Everlean Alstrom RN 15:40 08/24/19 ([email or web address]) Performed at [facility], [place] [address] [place], [place] [address]   Urine culture     Status: None   Collection Time: 08/24/19  2:39 PM   Specimen: Urine, Catheterized  Result Value Ref Range Status   Specimen Description URINE, CATHETERIZED  Final   Special Requests Normal  Final   Culture   Final    NO GROWTH Performed at [facility], [address], [place], [place] [address]    Report Status 08/25/2019 FINAL  Final    RADIOLOGY STUDIES/RESULTS: Dg Chest 1 View  Result Date: 08/24/2019 CLINICAL DATA:  78 year old male with central line placement. EXAM: CHEST  1 VIEW COMPARISON:  Chest radiograph dated 06/02 FINDINGS: Right IJ central venous line with tip close to the cavoatrial junction. No pneumothorax. There is a right pleural effusion with right lung base atelectasis. Infiltrate is not excluded. Clinical correlation is recommended. The left lung is clear. Stable cardiomegaly. Atherosclerotic calcification of the aortic arch. No acute osseous pathology. Osteopenia  with degenerative changes of the spine. Old left humeral neck fracture. IMPRESSION: 1. Right IJ central venous line with tip close to the cavoatrial junction. No pneumothorax. 2. Right pleural effusion with right lung base atelectasis versus infiltrate. Electronically Signed   By: Anner Crete M.D.   On: 08/24/2019 18:32   US Renal  Result Date: 08/24/2019 CLINICAL DATA:  Acute renal failure EXAM: RENAL / URINARY TRACT ULTRASOUND COMPLETE COMPARISON:  None. FINDINGS: Somewhat limited exam due to overlying bowel gas. Right Kidney: Renal measurements: 13.3 x 6.6 x 6.7 = volume: 316 mL . Echogenicity within normal limits. No mass or hydronephrosis visualized. Left Kidney: Renal measurements: 11.8 x 6.2 x 7.0 = volume: 266 mL. Echogenicity  within normal limits. No mass or hydronephrosis visualized. Bladder: The bladder is partially distended there is a heterogeneous nodular appearance to the prostate which protrudes into the posterior bladder. IMPRESSION: Somewhat limited exam due to overlying bowel gas, however normal appearing kidneys. Enlarged nodular prostate protruding into the posterior bladder. Electronically Signed   By: Prudencio Pair M.D.   On: 08/24/2019 02:18   Dg Chest Port 1 View  Result Date: 08/25/2019 CLINICAL DATA:  CHF EXAM: PORTABLE CHEST 1 VIEW COMPARISON:  Yesterday FINDINGS: Shortening of the right IJ catheter, tip near the SVC brachiocephalic confluence. Cardiomegaly with haziness of the right chest from pleural effusion. No superimposed Kerley lines or air bronchogram. IMPRESSION: 1. Shorter right IJ catheter, tip near the SVC brachiocephalic confluence. 2. Cardiomegaly and right pleural effusion that is unchanged. Electronically Signed   By: Monte Fantasia M.D.   On: 08/25/2019 07:54   Dg Chest Port 1 View  Result Date: 08/23/2019 CLINICAL DATA:  Dyspnea EXAM: PORTABLE CHEST 1 VIEW COMPARISON:  06/01/2017 FINDINGS: Cardiomegaly. Mild pulmonary vascular congestion. Calcific aortic knob. Right basilar opacity. No pneumothorax. Advanced degenerative changes of the shoulders. IMPRESSION: 1. Right basilar opacity which may reflect pleural effusion with atelectasis and/or pneumonia. 2. Cardiomegaly and pulmonary vascular congestion suggesting CHF. Electronically Signed   By: Davina Poke M.D.   On: 08/23/2019 17:12   Dg Humerus Left  Result Date: 08/27/2019 CLINICAL DATA:  Recent fall. Left humerus pain. EXAM: LEFT HUMERUS - 2+ VIEW COMPARISON:  None. FINDINGS: An impacted fracture of the left humeral neck is seen. No evidence of shoulder dislocation. Generalized osteopenia is demonstrated. Degenerative spurring of acromioclavicular joint and distal acromion process noted. IMPRESSION: Impacted fracture of left  humeral neck. Electronically Signed   By: Marlaine Hind M.D.   On: 08/27/2019 17:17     LOS: 9 days   Oren Binet, MD  [facility]  If 7PM-7AM, please contact night-coverage  Please page via [email or web address]  Go to [email or web address] and use [place]'s universal password to access. If you do not have the password, please contact the hospital operator.  Locate the [facility] provider you are looking for under [facility] and page to a number that you can be directly reached. If you still have difficulty reaching the provider, please page the [facility] (Director on Call) for the Hospitalists listed on amion for assistance.  09/01/2019, 11:04 AM

## 2019-09-02 DIAGNOSIS — I4821 Permanent atrial fibrillation: Secondary | ICD-10-CM

## 2019-09-02 LAB — CBC
HCT: 28.7 % — ABNORMAL LOW (ref 39.0–52.0)
Hemoglobin: 9.3 g/dL — ABNORMAL LOW (ref 13.0–17.0)
MCH: 30.7 pg (ref 26.0–34.0)
MCHC: 32.4 g/dL (ref 30.0–36.0)
MCV: 94.7 fL (ref 80.0–100.0)
Platelets: 168 10*3/uL (ref 150–400)
RBC: 3.03 MIL/uL — ABNORMAL LOW (ref 4.22–5.81)
RDW: 17 % — ABNORMAL HIGH (ref 11.5–15.5)
WBC: 6.5 10*3/uL (ref 4.0–10.5)
nRBC: 0 % (ref 0.0–0.2)

## 2019-09-02 LAB — GLUCOSE, CAPILLARY
Glucose-Capillary: 105 mg/dL — ABNORMAL HIGH (ref 70–99)
Glucose-Capillary: 113 mg/dL — ABNORMAL HIGH (ref 70–99)
Glucose-Capillary: 117 mg/dL — ABNORMAL HIGH (ref 70–99)
Glucose-Capillary: 175 mg/dL — ABNORMAL HIGH (ref 70–99)
Glucose-Capillary: 178 mg/dL — ABNORMAL HIGH (ref 70–99)
Glucose-Capillary: 213 mg/dL — ABNORMAL HIGH (ref 70–99)

## 2019-09-02 LAB — PROTIME-INR
INR: 1.8 — ABNORMAL HIGH (ref 0.8–1.2)
Prothrombin Time: 20.2 seconds — ABNORMAL HIGH (ref 11.4–15.2)

## 2019-09-02 LAB — BASIC METABOLIC PANEL
Anion gap: 10 (ref 5–15)
BUN: 28 mg/dL — ABNORMAL HIGH (ref 8–23)
CO2: 25 mmol/L (ref 22–32)
Calcium: 8.9 mg/dL (ref 8.9–10.3)
Chloride: 103 mmol/L (ref 98–111)
Creatinine, Ser: 1.15 mg/dL (ref 0.61–1.24)
GFR calc Af Amer: >60 mL/min (ref >60)
GFR calc non Af Amer: >60 mL/min (ref >60)
Glucose, Bld: 122 mg/dL — ABNORMAL HIGH (ref 70–99)
Potassium: 4.1 mmol/L (ref 3.5–5.1)
Sodium: 138 mmol/L (ref 135–145)

## 2019-09-02 LAB — HEPARIN LEVEL (UNFRACTIONATED)
Heparin Unfractionated: 0.6 IU/mL (ref 0.30–0.70)
Heparin Unfractionated: 0.55 IU/mL (ref 0.30–0.70)

## 2019-09-02 LAB — MAGNESIUM: Magnesium: 1.3 mg/dL — ABNORMAL LOW (ref 1.7–2.4)

## 2019-09-02 LAB — APTT: aPTT: 90 seconds — ABNORMAL HIGH (ref 24–36)

## 2019-09-02 MED ORDER — WARFARIN SODIUM 5 MG PO TABS
5.0000 mg | ORAL_TABLET | Freq: Once | ORAL | Status: AC
  Administered 2019-09-02: 5 mg via ORAL
  Filled 2019-09-02: qty 1

## 2019-09-02 NOTE — Progress Notes (Signed)
ANTICOAGULATION CONSULT NOTE  Pharmacy Consult for Heparin Indication: atrial fibrillation and LV Thrombus  Allergies  Allergen Reactions  . Tolterodine Swelling and Other (See Comments)    (Detrol) Dry mouth/dry throat, severe swelling of the legs, and started a "downward spiral"  . Lipitor [Atorvastatin] Other (See Comments)    Muscle aches  . Xarelto [Rivaroxaban] Rash    Patient Measurements: Height: 6\' 4"  (193 cm) Weight: 289 lb 11 oz (131.4 kg) IBW/kg (Calculated) : 86.8 Heparin Dosing Weight: 115 kg  Vital Signs: Temp: 98.2 F (36.8 C) (09/20 1659) Temp Source: Oral (09/20 1659) BP: 133/70 (09/20 1659) Pulse Rate: 90 (09/20 1659)  Labs: Recent Labs    08/31/19 0913 09/01/19 0646  09/01/19 2136 09/02/19 0722 09/02/19 1543  HGB 9.8* 9.3*  --   --  9.3*  --   HCT 32.3* 30.5*  --   --  28.7*  --   PLT 177 168  --   --  168  --   APTT 33 54*  --   --  90*  --   LABPROT 15.0 17.5*  --   --  20.2*  --   INR 1.2 1.5*  --   --  1.8*  --   HEPARINUNFRC 0.49 0.70   < > 0.79* 0.60 0.55  CREATININE 1.27* 1.15  --   --  1.15  --    < > = values in this interval not displayed.    Estimated Creatinine Clearance: 78.3 mL/min (by C-G formula based on SCr of 1.15 mg/dL).  Assessment: 78 y.o. male with history of Afib on Eliquis PTA.  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  Pharmacy consulted to dose IV heparin bridge to warfarin.   Heparin level remains therapeutic at 0.55 on heparin gtt 900 units/hr. No other bleeding or infusion issues per the RN.   Goal of Therapy:  Heparin level 0.3-0.7 units/ml  INR goal 2-3 Monitor platelets by anticoagulation protocol: Yes   Plan:  Continue heparin gtt to 900 units/hr Daily heparin level and CBC  Salome Arnt, PharmD, BCPS Please see AMION for all pharmacy numbers 09/02/2019 5:16 PM

## 2019-09-02 NOTE — Progress Notes (Signed)
ANTICOAGULATION CONSULT NOTE  Pharmacy Consult for Heparin Indication: atrial fibrillation and LV Thrombus  Allergies  Allergen Reactions  . Tolterodine Swelling and Other (See Comments)    (Detrol) Dry mouth/dry throat, severe swelling of the legs, and started a "downward spiral"  . Lipitor [Atorvastatin] Other (See Comments)    Muscle aches  . Xarelto [Rivaroxaban] Rash    Patient Measurements: Height: 6\' 4"  (193 cm) Weight: 289 lb 11 oz (131.4 kg) IBW/kg (Calculated) : 86.8 Heparin Dosing Weight: 115 kg  Vital Signs: Temp: 97.6 F (36.4 C) (09/20 0442) Temp Source: Oral (09/20 0442) BP: 105/63 (09/20 0442) Pulse Rate: 94 (09/20 0738)  Labs: Recent Labs    08/31/19 0913 09/01/19 0646 09/01/19 1502 09/01/19 2136 09/02/19 0722  HGB 9.8* 9.3*  --   --  9.3*  HCT 32.3* 30.5*  --   --  28.7*  PLT 177 168  --   --  168  APTT 33 54*  --   --  90*  LABPROT 15.0 17.5*  --   --  20.2*  INR 1.2 1.5*  --   --  1.8*  HEPARINUNFRC 0.49 0.70 0.69 0.79* 0.60  CREATININE 1.27* 1.15  --   --  1.15    Estimated Creatinine Clearance: 78.3 mL/min (by C-G formula based on SCr of 1.15 mg/dL).  Assessment: 78 y.o. male with history of Afib on Eliquis PTA.  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  Pharmacy consulted to dose IV heparin bridge to warfarin.   Heparin level is therapeutic at 0.60 on heparin gtt 900 units/hr. INR 1.8, will likely be therapeutic tomorrow. Hgb stable, Plt WNL. No further hematuria reported. No other bleeding or infusion issues per the RN.   Goal of Therapy:  Heparin level 0.3-0.7 units/ml  INR goal 2-3 Monitor platelets by anticoagulation protocol: Yes   Plan:  Continue heparin gtt to 900 units/hr Check confirmatory heparin level in 8 hours Give warfarin 5 mg x1 this evening  Monitor daily heparin level, INR, CBC, and s/sx of bleeding  Agnes Lawrence, PharmD PGY1 Pharmacy Resident

## 2019-09-02 NOTE — Plan of Care (Signed)
  Problem: Education: Goal: Knowledge of General Education information will improve Description: Including pain rating scale, medication(s)/side effects and non-pharmacologic comfort measures Outcome: Not Progressing   

## 2019-09-02 NOTE — Progress Notes (Signed)
Patient  Refused the use of CPAP, machine removed from patient's room due to continuous refusal.

## 2019-09-02 NOTE — Progress Notes (Signed)
PROGRESS NOTE        PATIENT DETAILS Name: Charles [name] Age: 78 y.o. Sex: male Date of Birth: 1941-09-18 Admit Date: 08/23/2019 Admitting Physician Collene Gobble, MD QP:[id number], Evie Lacks, MD  Brief Narrative: Patient is a 78 y.o. male history of CAD, chronic diastolic heart failure, COPD, atrial fibrillation, history of CVA-who presented to the hospital for worsening shortness of breath, increasing lower extremity edema-patient was found to have acute hypoxemic respiratory failure in the setting of decompensated combined systolic/diastolic heart failure, acute renal failure.  Managed in the intensive care unit-required CRRT BiPAP support.  Further evaluation with an echo revealed LV thrombus.  Upon further stability-transferred to the hospitalist service on 9/15.  Subjective: Sleeping comfortably-no chest pain or shortness of breath.  No major issues overnight.  Seen by urology yesterday-okay to remove Foley catheter.  Assessment/Plan: Acute kidney injury: Likely hemodynamically mediated-briefly required CRRT while in the ICU-renal function has normalized-nephrology has signed off.  Spoke with Dr. Jeffie Pollock on 9/19-urology-okay to remove Foley catheter.    Acute hypoxemic respiratory failure: Likely secondary to decompensated combined systolic/diastolic heart failure in the setting of worsening renal function.  Briefly required BiPAP-required CRRT for volume removal-now on room air.  Volume status remains relatively stable-has been started on Demadex.   Acute on chronic systolic and diastolic heart failure (EF 45-50%) along with right ventricular failure: Volume status remains stable-has been resumed on Demadex.  Appreciate cardiology input.  LV thrombus: Remains on IV heparin and overlapping Coumadin-INR remains subtherapeutic, hematuria has essentially resolved-amber-colored urine this morning-suspect we consider starting Plavix over the next few days.    PAF:  On Eliquis prior to this hospital stay-currently on IV heparin and overlapping Coumadin-INR remains subtherapeutic.  CAD: No anginal symptoms currently-plans are to start Plavix over the next few days.  Hematuria/acute urinary retention: Hematuria has essentially resolved-amber-colored urine this morning-spoke with Dr. Jeffie Pollock (urology) on 9/19-okay to discontinue Foley catheter.   Cellulitis of the left hip/abdomen along with MRSA UTI: Afebrile-improved-has completed a course of 7-day has completed a 7-day course of IV vancomycin.  DM-2: CBGs stable-continue with SSI  Chronic fracture of the left humeral head: Orthopedics following-recommendations are for supportive care.  Debility/deconditioning: PT/OT eval-plans are for SNF on discharge  S/p right TMA  Obesity: Estimated body mass index is 35.26 kg/m as calculated from the following:   Height as of this encounter: 6\' 4"  (1.93 m).   Weight as of this encounter: 131.4 kg.    Diet: Diet Order            Diet Heart Room service appropriate? Yes; Fluid consistency: Thin  Diet effective now               DVT Prophylaxis: Full dose anticoagulation with Heparin  Code Status: Partial Code: DNI-no CPR-OK for meds  Family Communication: Left  voicemail x2 for patient's spouse this morning.  Barriers to discharge: Ongoing hematuria-remains on IV heparin/overlapping Coumadin with subtherapeutic INR  Disposition Plan: Remain inpatient-SNF on discharge.   Antimicrobial agents: Anti-infectives (From admission, onward)   Start     Dose/Rate Route Frequency Ordered Stop   08/28/19 2200  vancomycin (VANCOCIN) 1,250 mg in sodium chloride 0.9 % 250 mL IVPB     1,250 mg 166.7 mL/hr over 90 Minutes Intravenous Every 24 hours 08/28/19 2144 08/30/19 2253   08/27/19 2100  ceFEPIme (MAXIPIME) 1 g in sodium chloride 0.9 % 100 mL IVPB  Status:  Discontinued     1 g 200 mL/hr over 30 Minutes Intravenous Every 24 hours 08/27/19 0729 08/27/19  1022   08/27/19 1800  vancomycin (VANCOCIN) 1,250 mg in sodium chloride 0.9 % 250 mL IVPB     1,250 mg 166.7 mL/hr over 90 Minutes Intravenous  Once 08/27/19 1750 08/27/19 1951   08/27/19 0726  vancomycin variable dose per unstable renal function (pharmacist dosing)  Status:  Discontinued      Does not apply See admin instructions 08/27/19 0726 08/29/19 1249   08/25/19 1600  vancomycin (VANCOCIN) 1,250 mg in sodium chloride 0.9 % 250 mL IVPB  Status:  Discontinued     1,250 mg 166.7 mL/hr over 90 Minutes Intravenous Every 24 hours 08/24/19 1803 08/27/19 0726   08/24/19 1815  ceFEPIme (MAXIPIME) 2 g in sodium chloride 0.9 % 100 mL IVPB  Status:  Discontinued     2 g 200 mL/hr over 30 Minutes Intravenous Every 12 hours 08/24/19 1803 08/27/19 0729   08/24/19 1545  ceFEPIme (MAXIPIME) 500 mg in dextrose 5 % 50 mL IVPB  Status:  Discontinued     500 mg 100 mL/hr over 30 Minutes Intravenous Every 24 hours 08/24/19 1537 08/24/19 1803   08/24/19 1545  vancomycin (VANCOCIN) 2,000 mg in sodium chloride 0.9 % 500 mL IVPB     2,000 mg 250 mL/hr over 120 Minutes Intravenous  Once 08/24/19 1537 08/24/19 1848   08/24/19 1537  vancomycin variable dose per unstable renal function (pharmacist dosing)  Status:  Discontinued      Does not apply See admin instructions 08/24/19 1537 08/24/19 1803   08/23/19 1730  cefTRIAXone (ROCEPHIN) 2 g in sodium chloride 0.9 % 100 mL IVPB  Status:  Discontinued     2 g 200 mL/hr over 30 Minutes Intravenous Every 24 hours 08/23/19 1721 08/24/19 1526   08/23/19 1730  doxycycline (VIBRAMYCIN) 100 mg in sodium chloride 0.9 % 250 mL IVPB     100 mg 125 mL/hr over 120 Minutes Intravenous  Once 08/23/19 1721 08/23/19 1956      Procedures:   CONSULTS:  cardiology, pulmonary/intensive care, nephrology and urology  Time spent: 25 minutes-Greater than 50% of this time was spent in counseling, explanation of diagnosis, planning of further management, and coordination of care.   MEDICATIONS: Scheduled Meds: . Chlorhexidine Gluconate Cloth  6 each Topical Daily  . feeding supplement (NEPRO CARB STEADY)  237 mL Oral TID BM  . finasteride  5 mg Oral Daily  . insulin aspart  0-9 Units Subcutaneous Q4H  . metoprolol tartrate  12.5 mg Oral BID  . mometasone-formoterol  2 puff Inhalation BID  . pravastatin  40 mg Oral q1800  . torsemide  40 mg Oral Daily  . warfarin  5 mg Oral ONCE-1800  . Warfarin - Pharmacist Dosing Inpatient   Does not apply q1800   Continuous Infusions: . sodium chloride 500 mL/hr at 08/28/19 0946  . sodium chloride    . heparin 900 Units/hr (09/02/19 0609)   PRN Meds:.acetaminophen, albuterol, LORazepam, ondansetron **OR** ondansetron (ZOFRAN) IV, oxyCODONE, sodium chloride flush   PHYSICAL EXAM: Vital signs: Vitals:   09/01/19 2035 09/02/19 0442 09/02/19 0738 09/02/19 0925  BP:  105/63  118/66  Pulse:  94 94 96  Resp:  18 18 17   Temp:  97.6 F (36.4 C)  97.9 F (36.6 C)  TempSrc:  Oral  Oral  SpO2:  95% 92% 94% 96%  Weight:      Height:       Filed Weights   08/28/19 0200 08/30/19 0417 08/31/19 0500  Weight: 130.1 kg 131.2 kg 131.4 kg   Body mass index is 35.26 kg/m.   Gen Exam:Alert awake-not in any distress HEENT:atraumatic, normocephalic Chest: B/L clear to auscultation anteriorly CVS:S1S2 regular Abdomen:soft non tender, non distended Extremities:no edema Neurology: Non focal Skin: no rash  I have personally reviewed following labs and imaging studies  LABORATORY DATA: CBC: Recent Labs  Lab 08/29/19 0534 08/30/19 0730 08/31/19 0913 09/01/19 0646 09/02/19 0722  WBC 7.5 7.1 7.2 7.4 6.5  HGB 9.5* 10.1* 9.8* 9.3* 9.3*  HCT 30.0* 31.0* 32.3* 30.5* 28.7*  MCV 94.0 94.2 96.7 96.5 94.7  PLT 138* 132* 177 168 [id number]    Basic Metabolic Panel: Recent Labs  Lab 08/29/19 0534 08/30/19 0730 08/31/19 0913 09/01/19 0646 09/02/19 0722  NA 134* 135 138 137 138  K 4.2 4.0 4.4 4.0 4.1  CL 100 102 102 102 103   CO2 23 22 27 24 25   GLUCOSE 145* 121* 163* 128* 122*  BUN 45* 37* 30* 29* 28*  CREATININE 1.75* 1.37* 1.27* 1.15 1.15  CALCIUM 9.1 8.9 9.0 8.9 8.9  MG 2.0 1.7 1.4* 1.3* 1.3*    GFR: Estimated Creatinine Clearance: 78.3 mL/min (by C-G formula based on SCr of 1.15 mg/dL).  Liver Function Tests: No results for input(s): AST, ALT, ALKPHOS, BILITOT, PROT, ALBUMIN in the last 168 hours. No results for input(s): LIPASE, AMYLASE in the last 168 hours. No results for input(s): AMMONIA in the last 168 hours.  Coagulation Profile: Recent Labs  Lab 08/30/19 1543 08/31/19 0913 09/01/19 0646 09/02/19 0722  INR 1.2 1.2 1.5* 1.8*    Cardiac Enzymes: No results for input(s): CKTOTAL, CKMB, CKMBINDEX, TROPONINI in the last 168 hours.  BNP (last 3 results) No results for input(s): PROBNP in the last 8760 hours.  HbA1C: No results for input(s): HGBA1C in the last 72 hours.  CBG: Recent Labs  Lab 09/01/19 1614 09/01/19 2015 09/02/19 0048 09/02/19 0440 09/02/19 0719  GLUCAP 212* 156* 113* 117* 105*    Lipid Profile: No results for input(s): CHOL, HDL, LDLCALC, TRIG, CHOLHDL, LDLDIRECT in the last 72 hours.  Thyroid Function Tests: No results for input(s): TSH, T4TOTAL, FREET4, T3FREE, THYROIDAB in the last 72 hours.  Anemia Panel: No results for input(s): VITAMINB12, FOLATE, FERRITIN, TIBC, IRON, RETICCTPCT in the last 72 hours.  Urine analysis:    Component Value Date/Time   COLORURINE AMBER (A) 08/23/2019 2231   APPEARANCEUR HAZY (A) 08/23/2019 2231   LABSPEC 1.017 08/23/2019 2231   PHURINE 5.0 08/23/2019 2231   GLUCOSEU NEGATIVE 08/23/2019 2231   GLUCOSEU NEGATIVE 05/25/2019 1353   HGBUR MODERATE (A) 08/23/2019 2231   BILIRUBINUR NEGATIVE 08/23/2019 2231   KETONESUR NEGATIVE 08/23/2019 2231   PROTEINUR 100 (A) 08/23/2019 2231   UROBILINOGEN 1.0 05/25/2019 1353   NITRITE NEGATIVE 08/23/2019 2231   LEUKOCYTESUR SMALL (A) 08/23/2019 2231    Sepsis Labs: Lactic  Acid, Venous    Component Value Date/Time   LATICACIDVEN 1.4 08/25/2019 0343    MICROBIOLOGY: Recent Results (from the past 240 hour(s))  SARS Coronavirus 2 [facility] order, Performed in [facility] hospital lab) Nasopharyngeal Nasopharyngeal Swab     Status: None   Collection Time: 08/23/19  5:16 PM   Specimen: Nasopharyngeal Swab  Result Value Ref Range Status   SARS Coronavirus 2 NEGATIVE NEGATIVE Final  Comment: (NOTE) If result is NEGATIVE SARS-CoV-2 target nucleic acids are NOT DETECTED. The SARS-CoV-2 RNA is generally detectable in upper and lower  respiratory specimens during the acute phase of infection. The lowest  concentration of SARS-CoV-2 viral copies this assay can detect is 250  copies / mL. A negative result does not preclude SARS-CoV-2 infection  and should not be used as the sole basis for treatment or other  patient management decisions.  A negative result may occur with  improper specimen collection / handling, submission of specimen other  than nasopharyngeal swab, presence of viral mutation(s) within the  areas targeted by this assay, and inadequate number of viral copies  (<250 copies / mL). A negative result must be combined with clinical  observations, patient history, and epidemiological information. If result is POSITIVE SARS-CoV-2 target nucleic acids are DETECTED. The SARS-CoV-2 RNA is generally detectable in upper and lower  respiratory specimens dur ing the acute phase of infection.  Positive  results are indicative of active infection with SARS-CoV-2.  Clinical  correlation with patient history and other diagnostic information is  necessary to determine patient infection status.  Positive results do  not rule out bacterial infection or co-infection with other viruses. If result is PRESUMPTIVE POSTIVE SARS-CoV-2 nucleic acids MAY BE PRESENT.   A presumptive positive result was obtained on the submitted specimen  and confirmed on repeat testing.   While 2019 novel coronavirus  (SARS-CoV-2) nucleic acids may be present in the submitted sample  additional confirmatory testing may be necessary for epidemiological  and / or clinical management purposes  to differentiate between  SARS-CoV-2 and other Sarbecovirus currently known to infect humans.  If clinically indicated additional testing with an alternate test  methodology [phone]) is advised. The SARS-CoV-2 RNA is generally  detectable in upper and lower respiratory sp ecimens during the acute  phase of infection. The expected result is Negative. Fact Sheet for Patients:  StrictlyIdeas.no Fact Sheet for Healthcare Providers: BankingDealers.co.za This test is not yet approved or cleared by the [place] FDA and has been authorized for detection and/or diagnosis of SARS-CoV-2 by FDA under an Emergency Use Authorization (EUA).  This EUA will remain in effect (meaning this test can be used) for the duration of the COVID-19 declaration under Section 564(b)(1) of the Act, 21 U.S.C. section 360bbb-3(b)(1), unless the authorization is terminated or revoked sooner. Performed at [facility], [place] [address] [place], [place] [address]   Blood Culture (routine x 2)     Status: None   Collection Time: 08/23/19  5:26 PM   Specimen: BLOOD  Result Value Ref Range Status   Specimen Description BLOOD BLOOD LEFT FOREARM  Final   Special Requests   Final    BOTTLES DRAWN AEROBIC AND ANAEROBIC Blood Culture adequate volume   Culture   Final    NO GROWTH 5 DAYS Performed at [facility], [place] [address] [place], [place] [address]    Report Status 08/28/2019 FINAL  Final  Blood Culture (routine x 2)     Status: None   Collection Time: 08/23/19  6:02 PM   Specimen: BLOOD  Result Value Ref Range Status   Specimen Description BLOOD RIGHT ANTECUBITAL  Final   Special Requests   Final    BOTTLES DRAWN AEROBIC ONLY Blood Culture  results may not be optimal due to an inadequate volume of blood received in culture bottles   Culture   Final    NO GROWTH 5  DAYS Performed at [facility], [place] [address], [place], [place] [address]    Report Status 08/28/2019 FINAL  Final  Urine culture     Status: Abnormal   Collection Time: 08/23/19  7:21 PM   Specimen: Urine, Random  Result Value Ref Range Status   Specimen Description URINE, RANDOM  Final   Special Requests   Final    NONE Performed at [facility], [place] [address], [place], [place] [address]    Culture (A)  Final    50,000 COLONIES/mL METHICILLIN RESISTANT STAPHYLOCOCCUS AUREUS   Report Status 08/26/2019 FINAL  Final   Organism ID, Bacteria METHICILLIN RESISTANT STAPHYLOCOCCUS AUREUS (A)  Final      Susceptibility   Methicillin resistant staphylococcus aureus - MIC*    CIPROFLOXACIN >=8 RESISTANT Resistant     GENTAMICIN <=0.5 SENSITIVE Sensitive     NITROFURANTOIN <=16 SENSITIVE Sensitive     OXACILLIN >=4 RESISTANT Resistant     TETRACYCLINE >=16 RESISTANT Resistant     VANCOMYCIN 1 SENSITIVE Sensitive     TRIMETH/SULFA <=10 SENSITIVE Sensitive     CLINDAMYCIN >=8 RESISTANT Resistant     RIFAMPIN <=0.5 SENSITIVE Sensitive     Inducible Clindamycin NEGATIVE Sensitive     * 50,000 COLONIES/mL METHICILLIN RESISTANT STAPHYLOCOCCUS AUREUS  MRSA PCR Screening     Status: Abnormal   Collection Time: 08/24/19  2:14 PM   Specimen: Nasal Mucosa; Nasopharyngeal  Result Value Ref Range Status   MRSA by PCR POSITIVE (A) NEGATIVE Final    Comment:        The GeneXpert MRSA Assay (FDA approved for NASAL specimens only), is one component of a comprehensive MRSA colonization surveillance program. It is not intended to diagnose MRSA infection nor to guide or monitor treatment for MRSA infections. RESULT CALLED TO, READ BACK BY AND VERIFIED WITH: Everlean Alstrom RN 15:40 08/24/19 ([email or web address]) Performed at [facility], [place] [address] [place], [place]  [address]   Urine culture     Status: None   Collection Time: 08/24/19  2:39 PM   Specimen: Urine, Catheterized  Result Value Ref Range Status   Specimen Description URINE, CATHETERIZED  Final   Special Requests Normal  Final   Culture   Final    NO GROWTH Performed at [facility], [address], [place],  [address]    Report Status 08/25/2019 FINAL  Final    RADIOLOGY STUDIES/RESULTS: Dg Chest 1 View  Result Date: 08/24/2019 CLINICAL DATA:  78 year old male with central line placement. EXAM: CHEST  1 VIEW COMPARISON:  Chest radiograph dated 06/02 FINDINGS: Right IJ central venous line with tip close to the cavoatrial junction. No pneumothorax. There is a right pleural effusion with right lung base atelectasis. Infiltrate is not excluded. Clinical correlation is recommended. The left lung is clear. Stable cardiomegaly. Atherosclerotic calcification of the aortic arch. No acute osseous pathology. Osteopenia with degenerative changes of the spine. Old left humeral neck fracture. IMPRESSION: 1. Right IJ central venous line with tip close to the cavoatrial junction. No pneumothorax. 2. Right pleural effusion with right lung base atelectasis versus infiltrate. Electronically Signed   By: Anner Crete M.D.   On: 08/24/2019 18:32   US Renal  Result Date: 08/24/2019 CLINICAL DATA:  Acute renal failure EXAM: RENAL / URINARY TRACT ULTRASOUND COMPLETE COMPARISON:  None. FINDINGS: Somewhat limited exam due to overlying bowel gas. Right Kidney: Renal measurements: 13.3 x 6.6 x 6.7 = volume: 316 mL .  Echogenicity within normal limits. No mass or hydronephrosis visualized. Left Kidney: Renal measurements: 11.8 x 6.2 x 7.0 = volume: 266 mL. Echogenicity within normal limits. No mass or hydronephrosis visualized. Bladder: The bladder is partially distended there is a heterogeneous nodular appearance to the prostate which protrudes into the posterior bladder. IMPRESSION: Somewhat limited exam due  to overlying bowel gas, however normal appearing kidneys. Enlarged nodular prostate protruding into the posterior bladder. Electronically Signed   By: Prudencio Pair M.D.   On: 08/24/2019 02:18   Dg Chest Port 1 View  Result Date: 08/25/2019 CLINICAL DATA:  CHF EXAM: PORTABLE CHEST 1 VIEW COMPARISON:  Yesterday FINDINGS: Shortening of the right IJ catheter, tip near the SVC brachiocephalic confluence. Cardiomegaly with haziness of the right chest from pleural effusion. No superimposed Kerley lines or air bronchogram. IMPRESSION: 1. Shorter right IJ catheter, tip near the SVC brachiocephalic confluence. 2. Cardiomegaly and right pleural effusion that is unchanged. Electronically Signed   By: Monte Fantasia M.D.   On: 08/25/2019 07:54   Dg Chest Port 1 View  Result Date: 08/23/2019 CLINICAL DATA:  Dyspnea EXAM: PORTABLE CHEST 1 VIEW COMPARISON:  06/01/2017 FINDINGS: Cardiomegaly. Mild pulmonary vascular congestion. Calcific aortic knob. Right basilar opacity. No pneumothorax. Advanced degenerative changes of the shoulders. IMPRESSION: 1. Right basilar opacity which may reflect pleural effusion with atelectasis and/or pneumonia. 2. Cardiomegaly and pulmonary vascular congestion suggesting CHF. Electronically Signed   By: Davina Poke M.D.   On: 08/23/2019 17:12   Dg Humerus Left  Result Date: 08/27/2019 CLINICAL DATA:  Recent fall. Left humerus pain. EXAM: LEFT HUMERUS - 2+ VIEW COMPARISON:  None. FINDINGS: An impacted fracture of the left humeral neck is seen. No evidence of shoulder dislocation. Generalized osteopenia is demonstrated. Degenerative spurring of acromioclavicular joint and distal acromion process noted. IMPRESSION: Impacted fracture of left humeral neck. Electronically Signed   By: Marlaine Hind M.D.   On: 08/27/2019 17:17     LOS: 10 days   Oren Binet, MD  [facility]  If 7PM-7AM, please contact night-coverage  Please page via [email or web address]  Go to [email or web address] and use  [facility]'s universal password to access. If you do not have the password, please contact the hospital operator.  Locate the [facility] provider you are looking for under [facility] and page to a number that you can be directly reached. If you still have difficulty reaching the provider, please page the [facility] (Director on Call) for the Hospitalists listed on amion for assistance.  09/02/2019, 10:22 AM

## 2019-09-02 NOTE — Progress Notes (Signed)
Progress Note  Patient Name: Charles [name] Date of Encounter: 09/02/2019  Primary Cardiologist: Sanda Klein, MD  Subjective   No palpitations or chest pain.  Inpatient Medications    Scheduled Meds: . Chlorhexidine Gluconate Cloth  6 each Topical Daily  . feeding supplement (NEPRO CARB STEADY)  237 mL Oral TID BM  . finasteride  5 mg Oral Daily  . insulin aspart  0-9 Units Subcutaneous Q4H  . metoprolol tartrate  12.5 mg Oral BID  . mometasone-formoterol  2 puff Inhalation BID  . pravastatin  40 mg Oral q1800  . torsemide  40 mg Oral Daily  . warfarin  5 mg Oral ONCE-1800  . Warfarin - Pharmacist Dosing Inpatient   Does not apply q1800   Continuous Infusions: . sodium chloride 500 mL/hr at 08/28/19 0946  . sodium chloride    . heparin 900 Units/hr (09/02/19 0609)   PRN Meds: acetaminophen, albuterol, LORazepam, ondansetron **OR** ondansetron (ZOFRAN) IV, oxyCODONE, sodium chloride flush   Vital Signs    Vitals:   09/01/19 2035 09/02/19 0442 09/02/19 0738 09/02/19 0925  BP:  105/63  118/66  Pulse:  94 94 96  Resp:  18 18 17   Temp:  97.6 F (36.4 C)  97.9 F (36.6 C)  TempSrc:  Oral  Oral  SpO2: 95% 92% 94% 96%  Weight:      Height:        Intake/Output Summary (Last 24 hours) at 09/02/2019 1031 Last data filed at 09/02/2019 0609 Gross per 24 hour  Intake 619.99 ml  Output 1250 ml  Net -630.01 ml   Filed Weights   08/28/19 0200 08/30/19 0417 08/31/19 0500  Weight: 130.1 kg 131.2 kg 131.4 kg    Telemetry    Atrial fibrillation.  Personally reviewed.  Physical Exam   GEN:  Obese male.  No acute distress.   Neck: No JVD. Cardiac:  Irregularly irregular, no gallop.  Respiratory: Nonlabored. Clear to auscultation bilaterally. GI: Soft, nontender, bowel sounds present. MS: No edema; No deformity. Neuro:  Nonfocal. Psych: Alert and oriented x 3. Normal affect.  Labs    Chemistry Recent Labs  Lab 08/31/19 0913 09/01/19 0646 09/02/19 0722   NA 138 137 138  K 4.4 4.0 4.1  CL 102 102 103  CO2 27 24 25   GLUCOSE 163* 128* 122*  BUN 30* 29* 28*  CREATININE 1.27* 1.15 1.15  CALCIUM 9.0 8.9 8.9  GFRNONAA 54* >60 >60  GFRAA >60 >60 >60  ANIONGAP 9 11 10      Hematology Recent Labs  Lab 08/31/19 0913 09/01/19 0646 09/02/19 0722  WBC 7.2 7.4 6.5  RBC 3.34* 3.16* 3.03*  HGB 9.8* 9.3* 9.3*  HCT 32.3* 30.5* 28.7*  MCV 96.7 96.5 94.7  MCH 29.3 29.4 30.7  MCHC 30.3 30.5 32.4  RDW 16.7* 16.9* 17.0*  PLT 177 168 168    Cardiac Enzymes Recent Labs  Lab 08/23/19 2245 08/24/19 0329  TROPONINIHS 45* 50*    Radiology    No results found.  Cardiac Studies   Echocardiogram 08/24/2019: 1. Mild dyskinesis of the left ventricular, entire apical segment. 2. Apical left ventricular aneurysm. 3. Moderate, fixed thrombus on the apical wall of the left ventricle. 4. The left ventricle has mildly reduced systolic function, with an ejection fraction of 45-50%. The cavity size was normal. Left ventricular diastolic function could not be evaluated secondary to atrial fibrillation. There is right ventricular volume  and pressure overload. 5. The right ventricle has severely  reduced systolic function. The cavity was severely enlarged. There is no increase in right ventricular wall thickness. Right ventricular systolic pressure is moderately elevated. 6. Left atrial size was moderately dilated. 7. Right atrial size was severely dilated. 8. Mild mitral valve prolapse, probably due to left heart "underfilling". 9. The mitral valve is grossly normal. Mild thickening of the mitral valve leaflet. There is mild to moderate mitral annular calcification present. 10. The tricuspid valve is grossly normal. Tricuspid valve regurgitation is moderate-severe. 11. Mild thickening of the aortic valve. Sclerosis without any evidence of stenosis of the aortic valve. 12. The aorta is normal unless otherwise noted. 13. The inferior vena cava was  dilated in size with <50% respiratory variability. 14. Evidence of right to left atrial level shunting detected by color flow Doppler. 15. When compared to te prior study: from 2018, there are multiple changes. The most striking abnormality is severe dilation and severe dysfunction of the right ventricle, with evidence of pressure and volume overload. 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 Although not previously described, there are findings that suggest this is a more chronic abnormality. There is probably a small "stretch" patent foramen ovale with intermittent high velocity, but low volume right to left atrial shunting.  Patient Profile     78 y.o. male with a history of CAD (MI /2000 with PTCA of pLAD, failed PCI of RCA 2013 and failed balloon angioplasty of RCA in 2017, now managed medically), chronic diastolic heart failure, DM, OSA on CPAP, COPD, hx of CVA, Afib pt has refused anticoagulation, hyperlipidemia, hard of hearing, legally blind, chronic leg edema, and venous stasis, was admitted 09/10 with shock (?septic + cardiogenic), acute resp failure and ARF.  Following for management of atrial fibrillation and LV mural thrombus.  Assessment & Plan    1.  Permanent atrial fibrillation with CHADSVASC score of 8.  He continues on heparin bridge with Coumadin loading and was started on low-dose Lopressor for general heart rate control yesterday.  2.  Apical LV mural thrombus.  He has been on heparin bridge with Coumadin loading per pharmacy.  INR 1.8.  3.  Acute on chronic combined heart failure.  LVEF 45 to 50% by recent echocardiogram.  He continues to diurese, approximately 1000 cc out more than in last 24 hours.  4.  Multivessel CAD, previously turned down for surgical revascularization and being managed medically.  Outpatient regimen included Ranexa, Imdur, Lopressor, and Pravachol.  Continue heparin bridge  with Coumadin per pharmacy, INR is up to 1.8.  Low-dose Lopressor was added back yesterday as well as Pravachol.  He continues on Demadex with stable renal function.  Signed, Rozann Lesches, MD  09/02/2019, 10:31 AM

## 2019-09-03 LAB — CBC
HCT: 28.2 % — ABNORMAL LOW (ref 39.0–52.0)
Hemoglobin: 9.1 g/dL — ABNORMAL LOW (ref 13.0–17.0)
MCH: 30.6 pg (ref 26.0–34.0)
MCHC: 32.3 g/dL (ref 30.0–36.0)
MCV: 94.9 fL (ref 80.0–100.0)
Platelets: 184 10*3/uL (ref 150–400)
RBC: 2.97 MIL/uL — ABNORMAL LOW (ref 4.22–5.81)
RDW: 16.9 % — ABNORMAL HIGH (ref 11.5–15.5)
WBC: 8.3 10*3/uL (ref 4.0–10.5)
nRBC: 0 % (ref 0.0–0.2)

## 2019-09-03 LAB — PROTIME-INR
INR: 1.8 — ABNORMAL HIGH (ref 0.8–1.2)
Prothrombin Time: 20.4 seconds — ABNORMAL HIGH (ref 11.4–15.2)

## 2019-09-03 LAB — MAGNESIUM: Magnesium: 1.4 mg/dL — ABNORMAL LOW (ref 1.7–2.4)

## 2019-09-03 LAB — GLUCOSE, CAPILLARY
Glucose-Capillary: 126 mg/dL — ABNORMAL HIGH (ref 70–99)
Glucose-Capillary: 132 mg/dL — ABNORMAL HIGH (ref 70–99)
Glucose-Capillary: 141 mg/dL — ABNORMAL HIGH (ref 70–99)
Glucose-Capillary: 154 mg/dL — ABNORMAL HIGH (ref 70–99)
Glucose-Capillary: 174 mg/dL — ABNORMAL HIGH (ref 70–99)
Glucose-Capillary: 181 mg/dL — ABNORMAL HIGH (ref 70–99)

## 2019-09-03 LAB — HEPARIN LEVEL (UNFRACTIONATED): Heparin Unfractionated: 0.39 IU/mL (ref 0.30–0.70)

## 2019-09-03 LAB — APTT: aPTT: 88 seconds — ABNORMAL HIGH (ref 24–36)

## 2019-09-03 MED ORDER — COUMADIN BOOK
Freq: Once | Status: AC
  Administered 2019-09-03: 13:00:00
  Filled 2019-09-03: qty 1

## 2019-09-03 MED ORDER — METOPROLOL SUCCINATE ER 25 MG PO TB24
25.0000 mg | ORAL_TABLET | Freq: Every day | ORAL | Status: DC
  Administered 2019-09-04 – 2019-09-06 (×3): 25 mg via ORAL
  Filled 2019-09-03 (×4): qty 1

## 2019-09-03 MED ORDER — MAGNESIUM SULFATE 2 GM/50ML IV SOLN
2.0000 g | Freq: Once | INTRAVENOUS | Status: AC
  Administered 2019-09-03: 2 g via INTRAVENOUS
  Filled 2019-09-03: qty 50

## 2019-09-03 MED ORDER — WARFARIN VIDEO: Freq: Once | Status: DC

## 2019-09-03 MED ORDER — METOPROLOL TARTRATE 12.5 MG HALF TABLET
12.5000 mg | ORAL_TABLET | Freq: Two times a day (BID) | ORAL | Status: AC
  Administered 2019-09-03: 12.5 mg via ORAL
  Filled 2019-09-03: qty 1

## 2019-09-03 MED ORDER — WARFARIN SODIUM 7.5 MG PO TABS
7.5000 mg | ORAL_TABLET | Freq: Once | ORAL | Status: AC
  Administered 2019-09-03: 7.5 mg via ORAL
  Filled 2019-09-03: qty 1

## 2019-09-03 NOTE — Progress Notes (Signed)
PROGRESS NOTE        PATIENT DETAILS Name: Charles [name] Age: 78 y.o. Sex: male Date of Birth: 01-Jul-1941 Admit Date: 08/23/2019 Admitting Physician Collene Gobble, MD [id number], Evie Lacks, MD  Brief Narrative: Patient is a 78 y.o. male history of CAD, chronic diastolic heart failure, COPD, atrial fibrillation, history of CVA-who presented to the hospital for worsening shortness of breath, increasing lower extremity edema-patient was found to have acute hypoxemic respiratory failure in the setting of decompensated combined systolic/diastolic heart failure, acute renal failure.  Managed in the intensive care unit-required CRRT BiPAP support.  Further evaluation with an echo revealed LV thrombus.  Upon further stability-transferred to the hospitalist service on 9/15.  Subjective: No major issues overnight-complains of some vague lower abdominal pain.  Only 50 cc drained in and out catheterization today.  Assessment/Plan: Acute kidney injury: Likely hemodynamically mediated-briefly required CRRT while in the ICU-renal function has normalized-nephrology has signed off.    Acute hypoxemic respiratory failure: Likely secondary to decompensated combined systolic/diastolic heart failure in the setting of worsening renal function.  Briefly required BiPAP-required CRRT for volume removal-now on room air.  Volume status remains relatively stable-has been started on Demadex.   Acute on chronic systolic and diastolic heart failure (EF 45-50%) along with right ventricular failure: Volume status remains stable-has been resumed on Demadex.  Appreciate cardiology input.  LV thrombus: Remains on IV heparin and overlapping Coumadin-INR although better-this is still subtherapeutic.    Permanent atrial fibrillation: On Eliquis prior to this hospital stay-currently on IV heparin and overlapping Coumadin-INR remains subtherapeutic.  Rate controlled with metoprolol.  CAD: No anginal  symptoms currently-plans are to start Plavix over the next few days.  Hematuria/acute urinary retention: Hematuria has resolved-Foley catheter was discontinued on 9/20 after discussion with urology.    Cellulitis of the left hip/abdomen along with MRSA UTI: Afebrile-improved-has completed a course of 7-day has completed a 7-day course of IV vancomycin.  DM-2: CBGs stable-continue with SSI  Chronic fracture of the left humeral head: Orthopedics following-recommendations are for supportive care.  Debility/deconditioning: PT/OT eval-plans are for SNF on discharge-over the next few days when INR is therapeutic.  S/p right TMA  Obesity: Estimated body mass index is 35.26 kg/m as calculated from the following:   Height as of this encounter: 6\' 4"  (1.93 m).   Weight as of this encounter: 131.4 kg.    Diet: Diet Order            Diet Heart Room service appropriate? Yes; Fluid consistency: Thin  Diet effective now               DVT Prophylaxis: Full dose anticoagulation with Heparin  Code Status: Partial Code: DNI-no CPR-OK for meds  Family Communication: Spoke with spouse on 9/20-we will reach out to spouse over the next few days.  Barriers to discharge: LV thrombus-has subtherapeutic INR-remains on overlapping IV heparin and Coumadin.    Disposition Plan: Remain inpatient-SNF on discharge over the next few days  Antimicrobial agents: Anti-infectives (From admission, onward)   Start     Dose/Rate Route Frequency Ordered Stop   08/28/19 2200  vancomycin (VANCOCIN) 1,250 mg in sodium chloride 0.9 % 250 mL IVPB     1,250 mg 166.7 mL/hr over 90 Minutes Intravenous Every 24 hours 08/28/19 2144 08/30/19 2253   08/27/19 2100  ceFEPIme (MAXIPIME)  1 g in sodium chloride 0.9 % 100 mL IVPB  Status:  Discontinued     1 g 200 mL/hr over 30 Minutes Intravenous Every 24 hours 08/27/19 0729 08/27/19 1022   08/27/19 1800  vancomycin (VANCOCIN) 1,250 mg in sodium chloride 0.9 % 250 mL  IVPB     1,250 mg 166.7 mL/hr over 90 Minutes Intravenous  Once 08/27/19 1750 08/27/19 1951   08/27/19 0726  vancomycin variable dose per unstable renal function (pharmacist dosing)  Status:  Discontinued      Does not apply See admin instructions 08/27/19 0726 08/29/19 1249   08/25/19 1600  vancomycin (VANCOCIN) 1,250 mg in sodium chloride 0.9 % 250 mL IVPB  Status:  Discontinued     1,250 mg 166.7 mL/hr over 90 Minutes Intravenous Every 24 hours 08/24/19 1803 08/27/19 0726   08/24/19 1815  ceFEPIme (MAXIPIME) 2 g in sodium chloride 0.9 % 100 mL IVPB  Status:  Discontinued     2 g 200 mL/hr over 30 Minutes Intravenous Every 12 hours 08/24/19 1803 08/27/19 0729   08/24/19 1545  ceFEPIme (MAXIPIME) 500 mg in dextrose 5 % 50 mL IVPB  Status:  Discontinued     500 mg 100 mL/hr over 30 Minutes Intravenous Every 24 hours 08/24/19 1537 08/24/19 1803   08/24/19 1545  vancomycin (VANCOCIN) 2,000 mg in sodium chloride 0.9 % 500 mL IVPB     2,000 mg 250 mL/hr over 120 Minutes Intravenous  Once 08/24/19 1537 08/24/19 1848   08/24/19 1537  vancomycin variable dose per unstable renal function (pharmacist dosing)  Status:  Discontinued      Does not apply See admin instructions 08/24/19 1537 08/24/19 1803   08/23/19 1730  cefTRIAXone (ROCEPHIN) 2 g in sodium chloride 0.9 % 100 mL IVPB  Status:  Discontinued     2 g 200 mL/hr over 30 Minutes Intravenous Every 24 hours 08/23/19 1721 08/24/19 1526   08/23/19 1730  doxycycline (VIBRAMYCIN) 100 mg in sodium chloride 0.9 % 250 mL IVPB     100 mg 125 mL/hr over 120 Minutes Intravenous  Once 08/23/19 1721 08/23/19 1956      Procedures:   CONSULTS:  cardiology, pulmonary/intensive care, nephrology and urology  Time spent: 25 minutes-Greater than 50% of this time was spent in counseling, explanation of diagnosis, planning of further management, and coordination of care.  MEDICATIONS: Scheduled Meds:  Chlorhexidine Gluconate Cloth  6 each Topical  Daily   feeding supplement (NEPRO CARB STEADY)  237 mL Oral TID BM   finasteride  5 mg Oral Daily   insulin aspart  0-9 Units Subcutaneous Q4H   [START ON 09/04/2019] metoprolol succinate  25 mg Oral Daily   metoprolol tartrate  12.5 mg Oral BID   mometasone-formoterol  2 puff Inhalation BID   pravastatin  40 mg Oral q1800   torsemide  40 mg Oral Daily   Warfarin - Pharmacist Dosing Inpatient   Does not apply q1800   Continuous Infusions:  sodium chloride 500 mL/hr at 08/28/19 0946   sodium chloride     heparin 900 Units/hr (09/03/19 1026)   magnesium sulfate bolus IVPB     PRN Meds:.acetaminophen, albuterol, LORazepam, ondansetron **OR** ondansetron (ZOFRAN) IV, oxyCODONE, sodium chloride flush   PHYSICAL EXAM: Vital signs: Vitals:   09/02/19 1953 09/02/19 2034 09/03/19 0407 09/03/19 0847  BP: 109/69  (!) 111/58 (!) 105/54  Pulse: 73 77 71 64  Resp: 18 16 18 18   Temp: 97.8 F (36.6 C)  97.9  F (36.6 C) 97.8 F (36.6 C)  TempSrc:   Oral Oral  SpO2: 94% 92% 100% 91%  Weight:      Height:       Filed Weights   08/28/19 0200 08/30/19 0417 08/31/19 0500  Weight: 130.1 kg 131.2 kg 131.4 kg   Body mass index is 35.26 kg/m.   Gen Exam:Alert awake-not in any distress HEENT:atraumatic, normocephalic Chest: B/L clear to auscultation anteriorly CVS:S1S2 regular Abdomen:soft non tender, non distended Extremities:no edema Neurology: Non focal-but with generalized weakness Skin: no rash  I have personally reviewed following labs and imaging studies  LABORATORY DATA: CBC: Recent Labs  Lab 08/30/19 0730 08/31/19 0913 09/01/19 0646 09/02/19 0722 09/03/19 0652  WBC 7.1 7.2 7.4 6.5 8.3  HGB 10.1* 9.8* 9.3* 9.3* 9.1*  HCT 31.0* 32.3* 30.5* 28.7* 28.2*  MCV 94.2 96.7 96.5 94.7 94.9  PLT 132* 177 168 168 [id number]    Basic Metabolic Panel: Recent Labs  Lab 08/29/19 0534 08/30/19 0730 08/31/19 0913 09/01/19 0646 09/02/19 0722 09/03/19 0652  NA 134* 135  138 137 138  --   K 4.2 4.0 4.4 4.0 4.1  --   CL 100 102 102 102 103  --   CO2 23 22 27 24 25   --   GLUCOSE 145* 121* 163* 128* 122*  --   BUN 45* 37* 30* 29* 28*  --   CREATININE 1.75* 1.37* 1.27* 1.15 1.15  --   CALCIUM 9.1 8.9 9.0 8.9 8.9  --   MG 2.0 1.7 1.4* 1.3* 1.3* 1.4*    GFR: Estimated Creatinine Clearance: 78.3 mL/min (by C-G formula based on SCr of 1.15 mg/dL).  Liver Function Tests: No results for input(s): AST, ALT, ALKPHOS, BILITOT, PROT, ALBUMIN in the last 168 hours. No results for input(s): LIPASE, AMYLASE in the last 168 hours. No results for input(s): AMMONIA in the last 168 hours.  Coagulation Profile: Recent Labs  Lab 08/30/19 1543 08/31/19 0913 09/01/19 0646 09/02/19 0722 09/03/19 0652  INR 1.2 1.2 1.5* 1.8* 1.8*    Cardiac Enzymes: No results for input(s): CKTOTAL, CKMB, CKMBINDEX, TROPONINI in the last 168 hours.  BNP (last 3 results) No results for input(s): PROBNP in the last 8760 hours.  HbA1C: No results for input(s): HGBA1C in the last 72 hours.  CBG: Recent Labs  Lab 09/02/19 1953 09/03/19 0023 09/03/19 0356 09/03/19 0719 09/03/19 1113  GLUCAP 213* 181* 132* 126* 141*    Lipid Profile: No results for input(s): CHOL, HDL, LDLCALC, TRIG, CHOLHDL, LDLDIRECT in the last 72 hours.  Thyroid Function Tests: No results for input(s): TSH, T4TOTAL, FREET4, T3FREE, THYROIDAB in the last 72 hours.  Anemia Panel: No results for input(s): VITAMINB12, FOLATE, FERRITIN, TIBC, IRON, RETICCTPCT in the last 72 hours.  Urine analysis:    Component Value Date/Time   COLORURINE AMBER (A) 08/23/2019 2231   APPEARANCEUR HAZY (A) 08/23/2019 2231   LABSPEC 1.017 08/23/2019 2231   PHURINE 5.0 08/23/2019 2231   GLUCOSEU NEGATIVE 08/23/2019 2231   GLUCOSEU NEGATIVE 05/25/2019 1353   HGBUR MODERATE (A) 08/23/2019 2231   BILIRUBINUR NEGATIVE 08/23/2019 2231   KETONESUR NEGATIVE 08/23/2019 2231   PROTEINUR 100 (A) 08/23/2019 2231   UROBILINOGEN  1.0 05/25/2019 1353   NITRITE NEGATIVE 08/23/2019 2231   LEUKOCYTESUR SMALL (A) 08/23/2019 2231    Sepsis Labs: Lactic Acid, Venous    Component Value Date/Time   LATICACIDVEN 1.4 08/25/2019 0343    MICROBIOLOGY: Recent Results (from the past 240 hour(s))  MRSA PCR  Screening     Status: Abnormal   Collection Time: 08/24/19  2:14 PM   Specimen: Nasal Mucosa; Nasopharyngeal  Result Value Ref Range Status   MRSA by PCR POSITIVE (A) NEGATIVE Final    Comment:        The GeneXpert MRSA Assay (FDA approved for NASAL specimens only), is one component of a comprehensive MRSA colonization surveillance program. It is not intended to diagnose MRSA infection nor to guide or monitor treatment for MRSA infections. RESULT CALLED TO, READ BACK BY AND VERIFIED WITH: Everlean Alstrom RN 15:40 08/24/19 ([email or web address]) Performed at [facility], [place] [address], [place], [place] [address]   Urine culture     Status: None   Collection Time: 08/24/19  2:39 PM   Specimen: Urine, Catheterized  Result Value Ref Range Status   Specimen Description URINE, CATHETERIZED  Final   Special Requests Normal  Final   Culture   Final    NO GROWTH Performed at [facility], [address] [place], [place] [address]    Report Status 08/25/2019 FINAL  Final    RADIOLOGY STUDIES/RESULTS: Dg Chest 1 View  Result Date: 08/24/2019 CLINICAL DATA:  78 year old male with central line placement. EXAM: CHEST  1 VIEW COMPARISON:  Chest radiograph dated 06/02 FINDINGS: Right IJ central venous line with tip close to the cavoatrial junction. No pneumothorax. There is a right pleural effusion with right lung base atelectasis. Infiltrate is not excluded. Clinical correlation is recommended. The left lung is clear. Stable cardiomegaly. Atherosclerotic calcification of the aortic arch. No acute osseous pathology. Osteopenia with degenerative changes of the spine. Old left humeral neck fracture. IMPRESSION: 1. Right IJ central  venous line with tip close to the cavoatrial junction. No pneumothorax. 2. Right pleural effusion with right lung base atelectasis versus infiltrate. Electronically Signed   By: Anner Crete M.D.   On: 08/24/2019 18:32   US Renal  Result Date: 08/24/2019 CLINICAL DATA:  Acute renal failure EXAM: RENAL / URINARY TRACT ULTRASOUND COMPLETE COMPARISON:  None. FINDINGS: Somewhat limited exam due to overlying bowel gas. Right Kidney: Renal measurements: 13.3 x 6.6 x 6.7 = volume: 316 mL . Echogenicity within normal limits. No mass or hydronephrosis visualized. Left Kidney: Renal measurements: 11.8 x 6.2 x 7.0 = volume: 266 mL. Echogenicity within normal limits. No mass or hydronephrosis visualized. Bladder: The bladder is partially distended there is a heterogeneous nodular appearance to the prostate which protrudes into the posterior bladder. IMPRESSION: Somewhat limited exam due to overlying bowel gas, however normal appearing kidneys. Enlarged nodular prostate protruding into the posterior bladder. Electronically Signed   By: Prudencio Pair M.D.   On: 08/24/2019 02:18   Dg Chest Port 1 View  Result Date: 08/25/2019 CLINICAL DATA:  CHF EXAM: PORTABLE CHEST 1 VIEW COMPARISON:  Yesterday FINDINGS: Shortening of the right IJ catheter, tip near the SVC brachiocephalic confluence. Cardiomegaly with haziness of the right chest from pleural effusion. No superimposed Kerley lines or air bronchogram. IMPRESSION: 1. Shorter right IJ catheter, tip near the SVC brachiocephalic confluence. 2. Cardiomegaly and right pleural effusion that is unchanged. Electronically Signed   By: Monte Fantasia M.D.   On: 08/25/2019 07:54   Dg Chest Port 1 View  Result Date: 08/23/2019 CLINICAL DATA:  Dyspnea EXAM: PORTABLE CHEST 1 VIEW COMPARISON:  06/01/2017 FINDINGS: Cardiomegaly. Mild pulmonary vascular congestion. Calcific aortic knob. Right basilar opacity. No pneumothorax. Advanced degenerative changes of the shoulders.  IMPRESSION: 1. Right basilar opacity which may  reflect pleural effusion with atelectasis and/or pneumonia. 2. Cardiomegaly and pulmonary vascular congestion suggesting CHF. Electronically Signed   By: Davina Poke M.D.   On: 08/23/2019 17:12   Dg Humerus Left  Result Date: 08/27/2019 CLINICAL DATA:  Recent fall. Left humerus pain. EXAM: LEFT HUMERUS - 2+ VIEW COMPARISON:  None. FINDINGS: An impacted fracture of the left humeral neck is seen. No evidence of shoulder dislocation. Generalized osteopenia is demonstrated. Degenerative spurring of acromioclavicular joint and distal acromion process noted. IMPRESSION: Impacted fracture of left humeral neck. Electronically Signed   By: Marlaine Hind M.D.   On: 08/27/2019 17:17     LOS: 11 days   Oren Binet, MD  [facility]  If 7PM-7AM, please contact night-coverage  Please page via [email or web address]  Go to [email or web address] and use [facility]'s universal password to access. If you do not have the password, please contact the hospital operator.  Locate the [facility] provider you are looking for under [facility] and page to a number that you can be directly reached. If you still have difficulty reaching the provider, please page the [facility] (Director on Call) for the Hospitalists listed on amion for assistance.  09/03/2019, 11:28 AM

## 2019-09-03 NOTE — Discharge Instructions (Signed)

## 2019-09-03 NOTE — Progress Notes (Signed)
ANTICOAGULATION CONSULT NOTE  Pharmacy Consult for Heparin Indication: atrial fibrillation and LV Thrombus  Allergies  Allergen Reactions  . Tolterodine Swelling and Other (See Comments)    (Detrol) Dry mouth/dry throat, severe swelling of the legs, and started a "downward spiral"  . Lipitor [Atorvastatin] Other (See Comments)    Muscle aches  . Xarelto [Rivaroxaban] Rash    Patient Measurements: Height: 6\' 4"  (193 cm) Weight: 289 lb 11 oz (131.4 kg) IBW/kg (Calculated) : 86.8 Heparin Dosing Weight: 115 kg  Vital Signs: Temp: 97.8 F (36.6 C) (09/21 0847) Temp Source: Oral (09/21 0847) BP: 105/54 (09/21 0847) Pulse Rate: 64 (09/21 0847)  Labs: Recent Labs    09/01/19 0646  09/02/19 0722 09/02/19 1543 09/03/19 0652  HGB 9.3*  --  9.3*  --  9.1*  HCT 30.5*  --  28.7*  --  28.2*  PLT 168  --  168  --  184  APTT 54*  --  90*  --  88*  LABPROT 17.5*  --  20.2*  --  20.4*  INR 1.5*  --  1.8*  --  1.8*  HEPARINUNFRC 0.70   < > 0.60 0.55 0.39  CREATININE 1.15  --  1.15  --   --    < > = values in this interval not displayed.    Estimated Creatinine Clearance: 78.3 mL/min (by C-G formula based on SCr of 1.15 mg/dL).  Assessment: 78 y.o. male with history of Afib on Eliquis PTA.  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  Pharmacy consulted to dose IV heparin bridge to warfarin.   Heparin level this morning remains therapeutic (HL 0.39 << 0.55, goal of 0.3-0.7). INR remains SUBtherapeutic (INR 1.8, goal of 2-3). Hematuria appears to be resolved. CBC stable.  Goal of Therapy:  Heparin level 0.3-0.7 units/ml  INR goal 2-3 Monitor platelets by anticoagulation protocol: Yes   Plan:  - Continue Heparin drip at 900 units/hr (9 ml/hr) - Warfarin 7.5 mg x 1 dose at 1800 today - Will continue to monitor for any signs/symptoms of bleeding and will follow up with heparin level and PT/INR in the a.m.   Thank you for allowing pharmacy to be a part of this  patient's care.  Alycia Rossetti, PharmD, BCPS Clinical Pharmacist 09/03/2019 12:55 PM   **Pharmacist phone directory can now be found on [email or web address] ([email or web address]).  Listed under [place].

## 2019-09-03 NOTE — Progress Notes (Signed)
Progress Note  Patient Name: Charles [name] Date of Encounter: 09/03/2019  Primary Cardiologist: Sanda Klein, MD   Subjective   Unable to assess.  Patient sleeping and difficult to arouse.   Inpatient Medications    Scheduled Meds: . Chlorhexidine Gluconate Cloth  6 each Topical Daily  . feeding supplement (NEPRO CARB STEADY)  237 mL Oral TID BM  . finasteride  5 mg Oral Daily  . insulin aspart  0-9 Units Subcutaneous Q4H  . metoprolol tartrate  12.5 mg Oral BID  . mometasone-formoterol  2 puff Inhalation BID  . pravastatin  40 mg Oral q1800  . torsemide  40 mg Oral Daily  . Warfarin - Pharmacist Dosing Inpatient   Does not apply q1800   Continuous Infusions: . sodium chloride 500 mL/hr at 08/28/19 0946  . sodium chloride    . heparin 900 Units/hr (09/02/19 0609)   PRN Meds: acetaminophen, albuterol, LORazepam, ondansetron **OR** ondansetron (ZOFRAN) IV, oxyCODONE, sodium chloride flush   Vital Signs    Vitals:   09/02/19 1953 09/02/19 2034 09/03/19 0407 09/03/19 0847  BP: 109/69  (!) 111/58 (!) 105/54  Pulse: 73 77 71 64  Resp: 18 16 18 18   Temp: 97.8 F (36.6 C)  97.9 F (36.6 C) 97.8 F (36.6 C)  TempSrc:   Oral Oral  SpO2: 94% 92% 100% 91%  Weight:      Height:        Intake/Output Summary (Last 24 hours) at 09/03/2019 0952 Last data filed at 09/03/2019 0600 Gross per 24 hour  Intake 1151.04 ml  Output 1575 ml  Net -423.96 ml   Last 3 Weights 08/31/2019 08/30/2019 08/28/2019  Weight (lbs) 289 lb 11 oz 289 lb 3.9 oz 286 lb 13.1 oz  Weight (kg) 131.4 kg 131.2 kg 130.1 kg      Telemetry    Atrial fibrillation.  Mostly 59s.  Occasional pauses up to 2 sec.- Personally Reviewed  ECG    n/a - Personally Reviewed  Physical Exam   VS:  BP (!) 105/54 (BP Location: Left Arm)   Pulse 64   Temp 97.8 F (36.6 C) (Oral)   Resp 18   Ht 6\' 4"  (1.93 m)   Wt 131.4 kg   SpO2 91%   BMI 35.26 kg/m  , BMI Body mass index is 35.26 kg/m. GENERAL:   Chronically ill-appearing.  No acute distress HEENT: Pupils equal round and reactive, fundi not visualized, oral mucosa unremarkable NECK:  No jugular venous distention, waveform within normal limits, carotid upstroke brisk and symmetric, no bruits LUNGS:  Clear to auscultation bilaterally HEART:  Irregularly irregular.  PMI not displaced or sustained,S1 and S2 within normal limits, no S3, no S4, no clicks, no rubs, no murmurs ABD:  Flat, positive bowel sounds normal in frequency in pitch, no bruits, no rebound, no guarding, no midline pulsatile mass, no hepatomegaly, no splenomegaly EXT:  2 plus pulses throughout, 1+ LE edema to upper tibia bilaterally, no cyanosis no clubbing SKIN:  Chronic stasis dermatitis NEURO:  Moves all extremities freely PSYCH:  Unable to assess.  Patient difficult to arouse   Labs    High Sensitivity Troponin:   Recent Labs  Lab 08/23/19 2245 08/24/19 0329  TROPONINIHS 45* 50*      Chemistry Recent Labs  Lab 08/31/19 0913 09/01/19 0646 09/02/19 0722  NA 138 137 138  K 4.4 4.0 4.1  CL 102 102 103  CO2 27 24 25   GLUCOSE 163* 128* 122*  BUN 30* 29* 28*  CREATININE 1.27* 1.15 1.15  CALCIUM 9.0 8.9 8.9  GFRNONAA 54* >60 >60  GFRAA >60 >60 >60  ANIONGAP 9 11 10      Hematology Recent Labs  Lab 09/01/19 0646 09/02/19 0722 09/03/19 0652  WBC 7.4 6.5 8.3  RBC 3.16* 3.03* 2.97*  HGB 9.3* 9.3* 9.1*  HCT 30.5* 28.7* 28.2*  MCV 96.5 94.7 94.9  MCH 29.4 30.7 30.6  MCHC 30.5 32.4 32.3  RDW 16.9* 17.0* 16.9*  PLT 168 168 184    BNPNo results for input(s): BNP, PROBNP in the last 168 hours.   DDimer No results for input(s): DDIMER in the last 168 hours.   Radiology    No results found.  Cardiac Studies   Echocardiogram 08/24/2019: 1. Mild dyskinesis of the left ventricular, entire apical segment. 2. Apical left ventricular aneurysm. 3. Moderate, fixed thrombus on the apical wall of the left ventricle. 4. The left ventricle has mildly  reduced systolic function, with an ejection fraction of 45-50%. The cavity size was normal. Left ventricular diastolic function could not be evaluated secondary to atrial fibrillation. There is right ventricular volume  and pressure overload. 5. The right ventricle has severely reduced systolic function. The cavity was severely enlarged. There is no increase in right ventricular wall thickness. Right ventricular systolic pressure is moderately elevated. 6. Left atrial size was moderately dilated. 7. Right atrial size was severely dilated. 8. Mild mitral valve prolapse, probably due to left heart "underfilling". 9. The mitral valve is grossly normal. Mild thickening of the mitral valve leaflet. There is mild to moderate mitral annular calcification present. 10. The tricuspid valve is grossly normal. Tricuspid valve regurgitation is moderate-severe. 11. Mild thickening of the aortic valve. Sclerosis without any evidence of stenosis of the aortic valve. 12. The aorta is normal unless otherwise noted. 13. The inferior vena cava was dilated in size with <50% respiratory variability. 14. Evidence of right to left atrial level shunting detected by color flow Doppler. 15. When compared to te prior study: from 2018, there are multiple changes. The most striking abnormality is severe dilation and severe dysfunction of the right ventricle, with evidence of pressure and volume overload. 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 Althoug not previously described, there are findings that suggest this is a more chronic abnormality. There is probably a small "stretch" patent foramen ovale with intermittent high velocity, but low volume right to left atrial shunting.  Patient Profile     78 y.o. male with CAD s/p MI (LAD PTCA, failed RCA PCI 2013, failed RCA POBA in 0000000, chronic diastolic heart failure, diabetes, OSA on CPAP, COPD, prior  stroke, persistent atrial fibrillation, hyperlipidemia, legally blind, hard of hearing admitted with mixed septic/cardiogenic shock, acute respiratory failure, and acute renal failure.  Cardiology has been following for atrial fibrillation and LV thrombus in the setting refusing anticoagulation.  Assessment & Plan    # Permanent atrial fibrillation:  Mr. Luedeman remains in atrial fibrillation.  Rates are well-controlled.  INR is still 1.8 today.  He remains on heparin bridge until INR is at least 2.  Continue metoprolol.  Magnesium remains low.  Continue supplementation.  Will switch to succinate.   # LV apical thrombus: Anticoagulation as above.  # Acute on chronic systolic and diastolic heart failure: LVEF 45-50%.  Renal function stable with diuresis.    # Multivessel CAD:  # Hyperlipidemia:  Not a candidate for surgical  revascularization.  Continue metoprolol, Imdur, ranolazine and pravastatin.         For questions or updates, please contact [place] Please consult [email or web address] for contact info under        Signed, Skeet Latch, MD  09/03/2019, 9:52 AM

## 2019-09-03 NOTE — TOC Progression Note (Signed)
Transition of Care [facility]) - Progression Note    Patient Details  Name: Charles [name] MRN: [id number] Date of Birth: [birth year]  [facility] of [facility] CM/SW Contact  Bartholomew Crews, RN Phone Number: [phone] 09/03/2019, 1:13 PM  Clinical Narrative:    Spoke with wife, Charles [name], on the phone to discuss bed offers. Charles [name] states that things have not gone well over weekend stating that patient is confused and doesn't even recognize her. Stated MD said this is likely patient's new baseline, and she is now thinking patient needs long term care. Discussed that patient may still have rehab needs even if final goal is long term care rather than home. Bed offers provided. Patient not medically ready for discharge. CM following for transition of care needs.    Expected Discharge Plan: [place] Barriers to Discharge: Continued Medical Work up  Expected Discharge Plan and Services Expected Discharge Plan: [place] In-house Referral: Clinical Social Work, [facility] Discharge Planning Services: CM Consult Post Acute Care Choice: [place] Living arrangements for the past 2 months: Single Family Home                                       Social Determinants of Health (SDOH) Interventions    Readmission Risk Interventions No flowsheet data found.

## 2019-09-04 LAB — CBC
HCT: 32.6 % — ABNORMAL LOW (ref 39.0–52.0)
Hemoglobin: 10.3 g/dL — ABNORMAL LOW (ref 13.0–17.0)
MCH: 30.4 pg (ref 26.0–34.0)
MCHC: 31.6 g/dL (ref 30.0–36.0)
MCV: 96.2 fL (ref 80.0–100.0)
Platelets: 192 10*3/uL (ref 150–400)
RBC: 3.39 MIL/uL — ABNORMAL LOW (ref 4.22–5.81)
RDW: 17.3 % — ABNORMAL HIGH (ref 11.5–15.5)
WBC: 7.1 10*3/uL (ref 4.0–10.5)
nRBC: 0 % (ref 0.0–0.2)

## 2019-09-04 LAB — MAGNESIUM: Magnesium: 1.7 mg/dL (ref 1.7–2.4)

## 2019-09-04 LAB — GLUCOSE, CAPILLARY
Glucose-Capillary: 137 mg/dL — ABNORMAL HIGH (ref 70–99)
Glucose-Capillary: 147 mg/dL — ABNORMAL HIGH (ref 70–99)
Glucose-Capillary: 152 mg/dL — ABNORMAL HIGH (ref 70–99)
Glucose-Capillary: 156 mg/dL — ABNORMAL HIGH (ref 70–99)
Glucose-Capillary: 185 mg/dL — ABNORMAL HIGH (ref 70–99)

## 2019-09-04 LAB — HEPARIN LEVEL (UNFRACTIONATED): Heparin Unfractionated: 0.44 IU/mL (ref 0.30–0.70)

## 2019-09-04 LAB — PROTIME-INR
INR: 1.7 — ABNORMAL HIGH (ref 0.8–1.2)
Prothrombin Time: 19.9 seconds — ABNORMAL HIGH (ref 11.4–15.2)

## 2019-09-04 LAB — APTT: aPTT: 56 seconds — ABNORMAL HIGH (ref 24–36)

## 2019-09-04 MED ORDER — ENSURE ENLIVE PO LIQD
237.0000 mL | Freq: Two times a day (BID) | ORAL | Status: DC
  Administered 2019-09-04 – 2019-09-05 (×2): 237 mL via ORAL

## 2019-09-04 MED ORDER — RANOLAZINE ER 500 MG PO TB12
500.0000 mg | ORAL_TABLET | Freq: Two times a day (BID) | ORAL | Status: DC
  Administered 2019-09-04 – 2019-09-06 (×5): 500 mg via ORAL
  Filled 2019-09-04 (×6): qty 1

## 2019-09-04 MED ORDER — WARFARIN SODIUM 10 MG PO TABS
10.0000 mg | ORAL_TABLET | Freq: Once | ORAL | Status: AC
  Administered 2019-09-04: 10 mg via ORAL
  Filled 2019-09-04: qty 1

## 2019-09-04 NOTE — Progress Notes (Signed)
PROGRESS NOTE        PATIENT DETAILS Name: Charles [name] Age: 78 y.o. Sex: male Date of Birth: 01-Feb-1941 Admit Date: 08/23/2019 Admitting Physician Collene Gobble, MD [id number], Evie Lacks, MD  Brief Narrative: Patient is a 78 y.o. male history of CAD, chronic diastolic heart failure, COPD, atrial fibrillation, history of CVA-who presented to the hospital for worsening shortness of breath, increasing lower extremity edema-patient was found to have acute hypoxemic respiratory failure in the setting of decompensated combined systolic/diastolic heart failure, acute renal failure.  Managed in the intensive care unit-required CRRT BiPAP support.  Further evaluation with an echo revealed LV thrombus.  Upon further stability-transferred to the hospitalist service on 9/15.  Subjective: Somewhat confused this morning-he was sleeping when I walked in this morning.  Per RN-no major issues overnight.  Assessment/Plan: Acute kidney injury: Likely hemodynamically mediated-briefly required CRRT while in the ICU-renal function has normalized-nephrology has signed off.    Acute hypoxemic respiratory failure: Likely secondary to decompensated combined systolic/diastolic heart failure in the setting of worsening renal function.  Briefly required BiPAP-required CRRT for volume removal-now on room air.  Volume status remains relatively stable-has been started on Demadex.   Acute on chronic systolic and diastolic heart failure (EF 45-50%) along with right ventricular failure: Volume status remains stable-has been resumed on Demadex.  Appreciate cardiology input.  LV thrombus: Remains on IV heparin and overlapping Coumadin-INR although better-still remains subtherapeutic.  Permanent atrial fibrillation: On Eliquis prior to this hospital stay-currently on IV heparin and overlapping Coumadin-INR remains subtherapeutic.  Rate controlled with metoprolol.  CAD: No anginal symptoms  currently-plans are to start Plavix over the next few days.  Hematuria/acute urinary retention: Hematuria has resolved-Foley catheter was discontinued on 9/20 after discussion with urology.    Cellulitis of the left hip/abdomen along with MRSA UTI: Afebrile-improved-has completed a course of 7-day has completed a 7-day course of IV vancomycin.  Dementia with delirium: Continues to wax and wane-at times he is oriented-but at times he is very confused.  Per spouse-family has been suspecting dementia for a while.  Supportive care continues.  DM-2: CBGs stable-continue with SSI  Chronic fracture of the left humeral head: Orthopedics following-recommendations are for supportive care.  Debility/deconditioning: PT/OT eval-plans are for SNF on discharge-over the next few days when INR is therapeutic.  S/p right TMA  Obesity: Estimated body mass index is 35.26 kg/m as calculated from the following:   Height as of this encounter: 6\' 4"  (1.93 m).   Weight as of this encounter: 131.4 kg.    Diet: Diet Order            Diet Heart Room service appropriate? Yes; Fluid consistency: Thin  Diet effective now               DVT Prophylaxis: Full dose anticoagulation with Heparin  Code Status: Partial Code: DNI-no CPR-OK for meds  Family Communication: Spoke with spouse on 9/22.  Barriers to discharge: LV thrombus-has subtherapeutic INR-remains on overlapping IV heparin and Coumadin.    Disposition Plan: Remain inpatient-SNF on discharge over the next few days  Antimicrobial agents: Anti-infectives (From admission, onward)   Start     Dose/Rate Route Frequency Ordered Stop   08/28/19 2200  vancomycin (VANCOCIN) 1,250 mg in sodium chloride 0.9 % 250 mL IVPB     1,250 mg 166.7 mL/hr  over 90 Minutes Intravenous Every 24 hours 08/28/19 2144 08/30/19 2253   08/27/19 2100  ceFEPIme (MAXIPIME) 1 g in sodium chloride 0.9 % 100 mL IVPB  Status:  Discontinued     1 g 200 mL/hr over 30 Minutes  Intravenous Every 24 hours 08/27/19 0729 08/27/19 1022   08/27/19 1800  vancomycin (VANCOCIN) 1,250 mg in sodium chloride 0.9 % 250 mL IVPB     1,250 mg 166.7 mL/hr over 90 Minutes Intravenous  Once 08/27/19 1750 08/27/19 1951   08/27/19 0726  vancomycin variable dose per unstable renal function (pharmacist dosing)  Status:  Discontinued      Does not apply See admin instructions 08/27/19 0726 08/29/19 1249   08/25/19 1600  vancomycin (VANCOCIN) 1,250 mg in sodium chloride 0.9 % 250 mL IVPB  Status:  Discontinued     1,250 mg 166.7 mL/hr over 90 Minutes Intravenous Every 24 hours 08/24/19 1803 08/27/19 0726   08/24/19 1815  ceFEPIme (MAXIPIME) 2 g in sodium chloride 0.9 % 100 mL IVPB  Status:  Discontinued     2 g 200 mL/hr over 30 Minutes Intravenous Every 12 hours 08/24/19 1803 08/27/19 0729   08/24/19 1545  ceFEPIme (MAXIPIME) 500 mg in dextrose 5 % 50 mL IVPB  Status:  Discontinued     500 mg 100 mL/hr over 30 Minutes Intravenous Every 24 hours 08/24/19 1537 08/24/19 1803   08/24/19 1545  vancomycin (VANCOCIN) 2,000 mg in sodium chloride 0.9 % 500 mL IVPB     2,000 mg 250 mL/hr over 120 Minutes Intravenous  Once 08/24/19 1537 08/24/19 1848   08/24/19 1537  vancomycin variable dose per unstable renal function (pharmacist dosing)  Status:  Discontinued      Does not apply See admin instructions 08/24/19 1537 08/24/19 1803   08/23/19 1730  cefTRIAXone (ROCEPHIN) 2 g in sodium chloride 0.9 % 100 mL IVPB  Status:  Discontinued     2 g 200 mL/hr over 30 Minutes Intravenous Every 24 hours 08/23/19 1721 08/24/19 1526   08/23/19 1730  doxycycline (VIBRAMYCIN) 100 mg in sodium chloride 0.9 % 250 mL IVPB     100 mg 125 mL/hr over 120 Minutes Intravenous  Once 08/23/19 1721 08/23/19 1956      Procedures:   CONSULTS:  cardiology, pulmonary/intensive care, nephrology and urology  Time spent: 25 minutes-Greater than 50% of this time was spent in counseling, explanation of diagnosis, planning  of further management, and coordination of care.  MEDICATIONS: Scheduled Meds: . Chlorhexidine Gluconate Cloth  6 each Topical Daily  . feeding supplement (ENSURE ENLIVE)  237 mL Oral BID BM  . feeding supplement (NEPRO CARB STEADY)  237 mL Oral TID BM  . finasteride  5 mg Oral Daily  . insulin aspart  0-9 Units Subcutaneous Q4H  . metoprolol succinate  25 mg Oral Daily  . mometasone-formoterol  2 puff Inhalation BID  . pravastatin  40 mg Oral q1800  . ranolazine  500 mg Oral BID  . torsemide  40 mg Oral Daily  . warfarin  10 mg Oral ONCE-1800  . warfarin   Does not apply Once  . Warfarin - Pharmacist Dosing Inpatient   Does not apply q1800   Continuous Infusions: . sodium chloride 500 mL/hr at 08/28/19 0946  . sodium chloride    . heparin 900 Units/hr (09/04/19 1149)   PRN Meds:.acetaminophen, albuterol, LORazepam, ondansetron **OR** ondansetron (ZOFRAN) IV, oxyCODONE, sodium chloride flush   PHYSICAL EXAM: Vital signs: Vitals:   09/03/19  1617 09/03/19 2022 09/04/19 0420 09/04/19 0945  BP: 119/71 115/70 121/72 100/60  Pulse: 78 85 89 66  Resp: 18 18 18 18   Temp: 98.1 F (36.7 C) (!) 97.5 F (36.4 C) 97.7 F (36.5 C) 98.6 F (37 C)  TempSrc: Oral Oral  Oral  SpO2: (!) 87% 96% 99%   Weight:      Height:       Filed Weights   08/28/19 0200 08/30/19 0417 08/31/19 0500  Weight: 130.1 kg 131.2 kg 131.4 kg   Body mass index is 35.26 kg/m.   Gen Exam: Sleeping-somewhat confused this morning when I wake him up. HEENT:atraumatic, normocephalic Chest: B/L clear to auscultation anteriorly CVS:S1S2 regular Abdomen:soft non tender, non distended Extremities:no edema Neurology: Non focal-but with generalized weakness. Skin: no rash I have personally reviewed following labs and imaging studies  LABORATORY DATA: CBC: Recent Labs  Lab 08/31/19 0913 09/01/19 0646 09/02/19 0722 09/03/19 0652 09/04/19 0607  WBC 7.2 7.4 6.5 8.3 7.1  HGB 9.8* 9.3* 9.3* 9.1* 10.3*  HCT  32.3* 30.5* 28.7* 28.2* 32.6*  MCV 96.7 96.5 94.7 94.9 96.2  PLT 177 168 168 184 [id number]    Basic Metabolic Panel: Recent Labs  Lab 08/29/19 0534 08/30/19 0730 08/31/19 0913 09/01/19 0646 09/02/19 0722 09/03/19 0652 09/04/19 0607  NA 134* 135 138 137 138  --   --   K 4.2 4.0 4.4 4.0 4.1  --   --   CL 100 102 102 102 103  --   --   CO2 23 22 27 24 25   --   --   GLUCOSE 145* 121* 163* 128* 122*  --   --   BUN 45* 37* 30* 29* 28*  --   --   CREATININE 1.75* 1.37* 1.27* 1.15 1.15  --   --   CALCIUM 9.1 8.9 9.0 8.9 8.9  --   --   MG 2.0 1.7 1.4* 1.3* 1.3* 1.4* 1.7    GFR: Estimated Creatinine Clearance: 78.3 mL/min (by C-G formula based on SCr of 1.15 mg/dL).  Liver Function Tests: No results for input(s): AST, ALT, ALKPHOS, BILITOT, PROT, ALBUMIN in the last 168 hours. No results for input(s): LIPASE, AMYLASE in the last 168 hours. No results for input(s): AMMONIA in the last 168 hours.  Coagulation Profile: Recent Labs  Lab 08/31/19 0913 09/01/19 0646 09/02/19 0722 09/03/19 0652 09/04/19 0607  INR 1.2 1.5* 1.8* 1.8* 1.7*    Cardiac Enzymes: No results for input(s): CKTOTAL, CKMB, CKMBINDEX, TROPONINI in the last 168 hours.  BNP (last 3 results) No results for input(s): PROBNP in the last 8760 hours.  HbA1C: No results for input(s): HGBA1C in the last 72 hours.  CBG: Recent Labs  Lab 09/03/19 2021 09/04/19 0005 09/04/19 0420 09/04/19 0732 09/04/19 1126  GLUCAP 174* 156* 137* 147* 152*    Lipid Profile: No results for input(s): CHOL, HDL, LDLCALC, TRIG, CHOLHDL, LDLDIRECT in the last 72 hours.  Thyroid Function Tests: No results for input(s): TSH, T4TOTAL, FREET4, T3FREE, THYROIDAB in the last 72 hours.  Anemia Panel: No results for input(s): VITAMINB12, FOLATE, FERRITIN, TIBC, IRON, RETICCTPCT in the last 72 hours.  Urine analysis:    Component Value Date/Time   COLORURINE AMBER (A) 08/23/2019 2231   APPEARANCEUR HAZY (A) 08/23/2019 2231    LABSPEC 1.017 08/23/2019 2231   PHURINE 5.0 08/23/2019 2231   GLUCOSEU NEGATIVE 08/23/2019 2231   GLUCOSEU NEGATIVE 05/25/2019 1353   HGBUR MODERATE (A) 08/23/2019 2231   BILIRUBINUR  NEGATIVE 08/23/2019 2231   KETONESUR NEGATIVE 08/23/2019 2231   PROTEINUR 100 (A) 08/23/2019 2231   UROBILINOGEN 1.0 05/25/2019 1353   NITRITE NEGATIVE 08/23/2019 2231   LEUKOCYTESUR SMALL (A) 08/23/2019 2231    Sepsis Labs: Lactic Acid, Venous    Component Value Date/Time   LATICACIDVEN 1.4 08/25/2019 0343    MICROBIOLOGY: No results found for this or any previous visit (from the past 240 hour(s)).  RADIOLOGY STUDIES/RESULTS: Dg Chest 1 View  Result Date: 08/24/2019 CLINICAL DATA:  78 year old male with central line placement. EXAM: CHEST  1 VIEW COMPARISON:  Chest radiograph dated 06/02 FINDINGS: Right IJ central venous line with tip close to the cavoatrial junction. No pneumothorax. There is a right pleural effusion with right lung base atelectasis. Infiltrate is not excluded. Clinical correlation is recommended. The left lung is clear. Stable cardiomegaly. Atherosclerotic calcification of the aortic arch. No acute osseous pathology. Osteopenia with degenerative changes of the spine. Old left humeral neck fracture. IMPRESSION: 1. Right IJ central venous line with tip close to the cavoatrial junction. No pneumothorax. 2. Right pleural effusion with right lung base atelectasis versus infiltrate. Electronically Signed   By: Anner Crete M.D.   On: 08/24/2019 18:32   US Renal  Result Date: 08/24/2019 CLINICAL DATA:  Acute renal failure EXAM: RENAL / URINARY TRACT ULTRASOUND COMPLETE COMPARISON:  None. FINDINGS: Somewhat limited exam due to overlying bowel gas. Right Kidney: Renal measurements: 13.3 x 6.6 x 6.7 = volume: 316 mL . Echogenicity within normal limits. No mass or hydronephrosis visualized. Left Kidney: Renal measurements: 11.8 x 6.2 x 7.0 = volume: 266 mL. Echogenicity within normal limits. No  mass or hydronephrosis visualized. Bladder: The bladder is partially distended there is a heterogeneous nodular appearance to the prostate which protrudes into the posterior bladder. IMPRESSION: Somewhat limited exam due to overlying bowel gas, however normal appearing kidneys. Enlarged nodular prostate protruding into the posterior bladder. Electronically Signed   By: Prudencio Pair M.D.   On: 08/24/2019 02:18   Dg Chest Port 1 View  Result Date: 08/25/2019 CLINICAL DATA:  CHF EXAM: PORTABLE CHEST 1 VIEW COMPARISON:  Yesterday FINDINGS: Shortening of the right IJ catheter, tip near the SVC brachiocephalic confluence. Cardiomegaly with haziness of the right chest from pleural effusion. No superimposed Kerley lines or air bronchogram. IMPRESSION: 1. Shorter right IJ catheter, tip near the SVC brachiocephalic confluence. 2. Cardiomegaly and right pleural effusion that is unchanged. Electronically Signed   By: Monte Fantasia M.D.   On: 08/25/2019 07:54   Dg Chest Port 1 View  Result Date: 08/23/2019 CLINICAL DATA:  Dyspnea EXAM: PORTABLE CHEST 1 VIEW COMPARISON:  06/01/2017 FINDINGS: Cardiomegaly. Mild pulmonary vascular congestion. Calcific aortic knob. Right basilar opacity. No pneumothorax. Advanced degenerative changes of the shoulders. IMPRESSION: 1. Right basilar opacity which may reflect pleural effusion with atelectasis and/or pneumonia. 2. Cardiomegaly and pulmonary vascular congestion suggesting CHF. Electronically Signed   By: Davina Poke M.D.   On: 08/23/2019 17:12   Dg Humerus Left  Result Date: 08/27/2019 CLINICAL DATA:  Recent fall. Left humerus pain. EXAM: LEFT HUMERUS - 2+ VIEW COMPARISON:  None. FINDINGS: An impacted fracture of the left humeral neck is seen. No evidence of shoulder dislocation. Generalized osteopenia is demonstrated. Degenerative spurring of acromioclavicular joint and distal acromion process noted. IMPRESSION: Impacted fracture of left humeral neck. Electronically  Signed   By: Marlaine Hind M.D.   On: 08/27/2019 17:17     LOS: 12 days   Oren Binet, MD  [facility]  If 7PM-7AM, please contact night-coverage  Please page via [email or web address]  Go to [email or web address] and use [facility] universal password to access. If you do not have the password, please contact the hospital operator.  Locate the [facility] provider you are looking for under [facility] and page to a number that you can be directly reached. If you still have difficulty reaching the provider, please page the [facility] (Director on Call) for the Hospitalists listed on amion for assistance.  09/04/2019, 1:48 PM

## 2019-09-04 NOTE — Progress Notes (Signed)
Progress Note  Patient Name: Charles [name] Date of Encounter: 09/04/2019  Primary Cardiologist: Sanda Klein, MD   Subjective   No significant overnight events. Patient very disoriented this morning. Could not answer questions appropriately. When asked for his name, patient stated, "I will tell you tomorrow" and when asked if he could tell me where he was patient said "[facility]." He states he is feeling fine today but could not gather much more than this.  Inpatient Medications    Scheduled Meds: . Chlorhexidine Gluconate Cloth  6 each Topical Daily  . feeding supplement (NEPRO CARB STEADY)  237 mL Oral TID BM  . finasteride  5 mg Oral Daily  . insulin aspart  0-9 Units Subcutaneous Q4H  . metoprolol succinate  25 mg Oral Daily  . mometasone-formoterol  2 puff Inhalation BID  . pravastatin  40 mg Oral q1800  . torsemide  40 mg Oral Daily  . warfarin   Does not apply Once  . Warfarin - Pharmacist Dosing Inpatient   Does not apply q1800   Continuous Infusions: . sodium chloride 500 mL/hr at 08/28/19 0946  . sodium chloride    . heparin 900 Units/hr (09/03/19 1026)   PRN Meds: acetaminophen, albuterol, LORazepam, ondansetron **OR** ondansetron (ZOFRAN) IV, oxyCODONE, sodium chloride flush   Vital Signs    Vitals:   09/03/19 1617 09/03/19 2022 09/04/19 0420 09/04/19 0945  BP: 119/71 115/70 121/72 100/60  Pulse: 78 85 89 66  Resp: 18 18 18 18   Temp: 98.1 F (36.7 C) (!) 97.5 F (36.4 C) 97.7 F (36.5 C) 98.6 F (37 C)  TempSrc: Oral Oral  Oral  SpO2: (!) 87% 96% 99%   Weight:      Height:        Intake/Output Summary (Last 24 hours) at 09/04/2019 0959 Last data filed at 09/04/2019 0846 Gross per 24 hour  Intake 326.97 ml  Output 950 ml  Net -623.03 ml   Last 3 Weights 08/31/2019 08/30/2019 08/28/2019  Weight (lbs) 289 lb 11 oz 289 lb 3.9 oz 286 lb 13.1 oz  Weight (kg) 131.4 kg 131.2 kg 130.1 kg      Telemetry    Atrial fibrillation with rates mostly  controlled in the 70's to 90's with few brief spikes in the 110's. A couple of pauses noted with longest being about 2.02 seconds. - Personally Reviewed  ECG    No new ECG tracing since 08/25/2019. - Personally Reviewed  Physical Exam   GEN: Elderly ill-appearing Caucasian male resting comfortably in no acute distress.   Neck: Supple. Cardiac: Irregularly irregular with normal rate. No murmurs, rubs, or gallops.  Respiratory: Clear to auscultation anteriorly. No wheezes, rhonchi, or rales. GI: Soft, non-distended, and non-tender. Bowel sounds present. MS: Mild lower extremity edema. Legs current wrapped.  Skin: Warm and dry. Neuro:  Alert but very disoriented. No focal deficits. Moves all extremities freely. Psych: Very disoriented. Unable to answer questions appropriately.  Labs    High Sensitivity Troponin:   Recent Labs  Lab 08/23/19 2245 08/24/19 0329  TROPONINIHS 45* 50*      Chemistry Recent Labs  Lab 08/31/19 0913 09/01/19 0646 09/02/19 0722  NA 138 137 138  K 4.4 4.0 4.1  CL 102 102 103  CO2 27 24 25   GLUCOSE 163* 128* 122*  BUN 30* 29* 28*  CREATININE 1.27* 1.15 1.15  CALCIUM 9.0 8.9 8.9  GFRNONAA 54* >60 >60  GFRAA >60 >60 >60  ANIONGAP 9 11  10     Hematology Recent Labs  Lab 09/02/19 0722 09/03/19 0652 09/04/19 0607  WBC 6.5 8.3 7.1  RBC 3.03* 2.97* 3.39*  HGB 9.3* 9.1* 10.3*  HCT 28.7* 28.2* 32.6*  MCV 94.7 94.9 96.2  MCH 30.7 30.6 30.4  MCHC 32.4 32.3 31.6  RDW 17.0* 16.9* 17.3*  PLT 168 184 192    BNPNo results for input(s): BNP, PROBNP in the last 168 hours.   DDimer No results for input(s): DDIMER in the last 168 hours.   Radiology    No results found.  Cardiac Studies   Echocardiogram 08/24/2019: Impressions:  1. Mild dyskinesis of the left ventricular, entire apical segment.  2. Apical left ventricular aneurysm.  3. Moderate, fixed thrombus on the apical wall of the left ventricle.  4. The left ventricle has mildly reduced  systolic function, with an ejection fraction of 45-50%. The cavity size was normal. Left ventricular diastolic function could not be evaluated secondary to atrial fibrillation. There is right ventricular volume and pressure overload.  5. The right ventricle has severely reduced systolic function. The cavity was severely enlarged. There is no increase in right ventricular wall thickness. Right ventricular systolic pressure is moderately elevated.  6. Left atrial size was moderately dilated.  7. Right atrial size was severely dilated.  8. Mild mitral valve prolapse, probably due to left heart "underfilling".  9. The mitral valve is grossly normal. Mild thickening of the mitral valve leaflet. There is mild to moderate mitral annular calcification present. 10. The tricuspid valve is grossly normal. Tricuspid valve regurgitation is moderate-severe. 11. Mild thickening of the aortic valve. Sclerosis without any evidence of stenosis of the aortic valve. 12. The aorta is normal unless otherwise noted. 13. The inferior vena cava was dilated in size with <50% respiratory variability. 14. Evidence of right to left atrial level shunting detected by color flow Doppler. 15. When compared to the prior study: from 2018, there are multiple changes. The most striking abnormality is severe dilation and severe dysfunction of the right ventricle, with evidence of pressure and volume overload. 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 Although not previously described, there are findings that suggest this is a more chronic abnormality.There is probably a small "stretch" patent foramen ovale with intermittent high velocity, but low volume right to left atrial shunting. _______________  Left Heart Catheterization 06/01/2017:  Mid LAD lesion, 60 %stenosed.  Dist LAD lesion, 80 %stenosed.  Ost Cx to Prox Cx lesion, 30 %stenosed.  Prox RCA-1 lesion, 80  %stenosed.  Prox RCA-2 lesion, 99 %stenosed.  Mid RCA to Dist RCA lesion, 20 %stenosed.  Ost 2nd Mrg to 2nd Mrg lesion, 90 %stenosed.  Prox Cx to Mid Cx lesion, 20 %stenosed.  Mid Cx lesion, 60 %stenosed.  Ost LAD to Prox LAD lesion, 20 %stenosed.   1. Severe triple vessel CAD 2. The LAD is a heavily calcified vessel that courses to the apex. The proximal vessel has diffuse severe calcification. The mid vessel has a focal moderate stenosis. The distal vessel has high grade diffuse stenosis, unchanged from last cath.  3. The Circumflex is heavily calcified vessel with mild proximal and mid stenosis. The second OM branch is moderate in caliber with severe proximal calcified stenosis. The distal Circumflex has moderate disease.  4. The large, dominant RCA has a complex, heavily calcified severe stenosis in the proximal segment before and after a Shepherd's crook (bend in the vessel).  Recommendations: Our team has attempted PCI of the complex proximal RCA lesion in September 2017 but was unsuccessful. Dr. Martinique was not able to cross the lesion with a balloon. Atherectomy is not an option given the tortuosity of the vessel and danger of perforation. I have reviewed the films today with the rep from [facility] atherectomy and Rotablator atherectomy and this lesion is not felt to be favorable for either device. I will have our team consult CT surgery tomorrow to discuss CABG but he seems to be an overall poor candidate for CABG given his morbid obesity, sedentary lifestyle, chronic venous insufficiency, COPD and prior CVA. Restart heparin drip 8 hours post sheath pull. Plavix has been on hold since admission.   Patient Profile   Charles [name] is a 78 y.o. male with a history of multivessel CAD s/p multiple PCIs (LAD PTCA in 2000, failed RCA PCI in 2013, and failed RCA POBA in 2017), chronic diastolic heart failure, permanent atrial fibrillation, OSA on CPAP, COPD, prior stroke, diabetes,  hyperlipidemia, legally blind, hard of hearing admitted with mixed septic/cardiogenic shock, acute respiratory failure, and acute renal failure.  Cardiology has been following for atrial fibrillation and LV thrombus in the setting of refusing anticoagulation.  Assessment & Plan    Permanent Atrial Fibrillation - Telemetry shows atrial fibrillation with rates mostly controlled in the 70's to 90's. - Continue Toprol-XL 25mg  daily. - CHA2DS2-VASc = 8 (CHF, CAD, stroke x2, DM, HTN age x2). Currently on Coumadin with Heparin bridge until INR is a least 2. INR 1.7 today.  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 - Continue anticoagulation as above. - Will need follow-up Echo at some point. Will defer timing of this to primary Cardiologist.  Acute on Chronic Combined CHF with RV Dysfunction - Echo this admission showed LVEF of 45-50% with mild dyskinesis of the entire apical segment. Multiple changes from prior Echo in 2018 including severe dilation and severe dysfunction of the RV with evidence of pressure and volume overload. See full report above. - On PO Torsemide 40mg  daily. Net negative 8.8 L this admission. Creatinine stable on 09/02/2019 - will recheck today. - Continue current dose of Torsemide. - Continue Toprol as above. - Continue to monitor daily weight, strict I/O's, and renal function.  Elevated Troponin - High-sensitivity troponin mildly elevated and flat on admission at 45 >> 50. Not consistent with ACS. - EKG shows no acute ischemic changes compared to prior tracings. - Suspect demand ischemia in setting of acute respiratory failure, acute renal failure, and shock.   Multivessel CAD - Patient has known three vessel CAD with multiple failed PCI attempts. Last cath in 2018 showed 3 vessel CAD. Not felt to have any PCI options. Also not felt to be good CABG candidate.  Treated medically. - Continue current medical regimen: Toprol 25mg  daily and Pravastatin 40mg  daily. Patient on Imdur 60mg  daily and Ranexa 500mg  twice daily at home but these were not started here on admission.   Hyperlipidemia - Last LDL in our sytem 80 in 05/2017.  - LDL goal <70 given CAD. - Patient previously intolerant to high-intensity statin (Lipitor). Currently on Pravastatin 40mg  daily. May want to consider trying Crestor or adding Zetia.  AKI - Briefly required CRRT while in the ICU. - Renal function has since normalized and Nephrology has signed off.  Otherwise, per primary team. - Acute hypoxemic respiratory failure - MRSA UTI -  Hematuria/acute urinary retention - Cellulitis of lef thip/abdomen - Diabetes mellitus - Chronic left humeral head fracture - Debility/deconditioning: going to SNF at discharge   For questions or updates, please contact [place] Please consult [email or web address] for contact info under        Signed, Darreld Mclean, PA-C  09/04/2019, 9:59 AM

## 2019-09-04 NOTE — Progress Notes (Signed)
ANTICOAGULATION CONSULT NOTE  Pharmacy Consult for Heparin Indication: atrial fibrillation and LV Thrombus  Allergies  Allergen Reactions  . Tolterodine Swelling and Other (See Comments)    (Detrol) Dry mouth/dry throat, severe swelling of the legs, and started a "downward spiral"  . Lipitor [Atorvastatin] Other (See Comments)    Muscle aches  . Xarelto [Rivaroxaban] Rash    Patient Measurements: Height: 6\' 4"  (193 cm) Weight: 289 lb 11 oz (131.4 kg) IBW/kg (Calculated) : 86.8 Heparin Dosing Weight: 115 kg  Vital Signs: Temp: 98.6 F (37 C) (09/22 0945) Temp Source: Oral (09/22 0945) BP: 100/60 (09/22 0945) Pulse Rate: 66 (09/22 0945)  Labs: Recent Labs    09/02/19 0722 09/02/19 1543 09/03/19 0652 09/04/19 0607  HGB 9.3*  --  9.1* 10.3*  HCT 28.7*  --  28.2* 32.6*  PLT 168  --  184 192  APTT 90*  --  88* 56*  LABPROT 20.2*  --  20.4* 19.9*  INR 1.8*  --  1.8* 1.7*  HEPARINUNFRC 0.60 0.55 0.39 0.44  CREATININE 1.15  --   --   --     Estimated Creatinine Clearance: 78.3 mL/min (by C-G formula based on SCr of 1.15 mg/dL).  Assessment: 78 y.o. male with history of Afib on Eliquis PTA.  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  Pharmacy consulted to dose IV heparin bridge to warfarin.   Heparin level this morning remains therapeutic (HL 0.44 << 0.39, goal of 0.3-0.7). INR remains SUBtherapeutic and trended down despite a higher dose given yesterday (INR 1.7 << 1.8, goal of 2-3). Hematuria appears to be resolved. CBC stable. Noted poor po intake so will monitor INR trends closely.  Goal of Therapy:  Heparin level 0.3-0.7 units/ml  INR goal 2-3 Monitor platelets by anticoagulation protocol: Yes   Plan:  - Continue Heparin drip at 900 units/hr (9 ml/hr) - Warfarin 10 mg x 1 dose at 1800 today - Will continue to monitor for any signs/symptoms of bleeding and will follow up with heparin level and PT/INR in the a.m.   Thank you for allowing  pharmacy to be a part of this patient's care.  Alycia Rossetti, PharmD, BCPS Clinical Pharmacist 09/04/2019 12:45 PM   **Pharmacist phone directory can now be found on [email or web address] ([email or web address]).  Listed under [place].

## 2019-09-04 NOTE — Progress Notes (Signed)
Pt ate 1 cup of sliced peaches and drank 1 bottle of chocolate ensure.

## 2019-09-04 NOTE — Progress Notes (Signed)
Physical Therapy Treatment Patient Details Name: DEMARKIS SALMINEN MRN: [id number] DOB: Oct 13, 1941 Today's Date: 09/04/2019    History of Present Illness 78 yo male with onset of lasix overdose of 30 pills accidentally was admitted to manage his electrolytes and fluid.  PMHx:  R transmet amputation with cellulitis, L humerus fracture 01/2019, DM, PN, B12 defic, CAD, CHF, PAF, OSA, COPD, CVA, urinary incontinence    PT Comments    Continuing work on functional mobility and activity tolerance;  Noting a decline in ability to participate, and significantly increased need for assist with mobility today; +2 total assist for sitting up on the EOB; at this point, recommend [place] for safe transfers OOB to chair; continue to rec SNF   Follow Up Recommendations  SNF     Equipment Recommendations  None recommended by PT    Recommendations for Other Services       Precautions / Restrictions Precautions Precautions: Fall Precaution Comments: limited L shoulder ROM due to humerus fx 01/2019 Required Braces or Orthoses: Other Brace Other Brace: R foot shoe which pt does not wear Restrictions Other Position/Activity Restrictions: pt lacks peripheral vision    Mobility  Bed Mobility Overal bed mobility: Needs Assistance Bed Mobility: Supine to Sit;Sit to Supine     Supine to sit: Total assist;+2 for physical assistance Sit to supine: Total assist;+2 for physical assistance   General bed mobility comments: Total assist with all aspects of bed mobility; Requires 2 person assist for safety  Transfers Overall transfer level: Needs assistance   Transfers: Lateral/Scoot Transfers          Lateral/Scoot Transfers: +2 physical assistance;Max assist General transfer comment: Attempted to simulate lateral scoot transfer, using bed pad to scoot to [facility]; unable to weight shift onto feet to unweigh hips for scooting  Ambulation/Gait                 Stairs             Wheelchair  Mobility    Modified Rankin (Stroke Patients Only)       Balance     Sitting balance-Leahy Scale: Poor(approaching Fair)                                      Cognition Arousal/Alertness: (Sleepy; Eyes oen briefly, then close) Behavior During Therapy: Restless Overall Cognitive Status: Impaired/Different from baseline Area of Impairment: Following commands;Safety/judgement;Problem solving;Attention;Memory;Orientation                 Orientation Level: Disoriented to;Situation;Time Current Attention Level: Focused Memory: Decreased short-term memory Following Commands: Follows one step commands inconsistently Safety/Judgement: Decreased awareness of safety;Decreased awareness of deficits   Problem Solving: Slow processing;Difficulty sequencing;Decreased initiation;Requires verbal cues;Requires tactile cues        Exercises      General Comments        Pertinent Vitals/Pain Pain Assessment: Faces Faces Pain Scale: Hurts even more Pain Location: L shoulder and arm Pain Descriptors / Indicators: Grimacing Pain Intervention(s): Monitored during session    Home Living                      Prior Function            PT Goals (current goals can now be found in the care plan section) Acute Rehab PT Goals Patient Stated Goal: Wife wants pt to get a rehab placement PT  Goal Formulation: With patient/family Time For Goal Achievement: 09/14/19 Potential to Achieve Goals: Fair Progress towards PT goals: Not progressing toward goals - comment    Frequency    Min 2X/week      PT Plan Current plan remains appropriate    Co-evaluation              AM-PAC PT "6 Clicks" Mobility   Outcome Measure  Help needed turning from your back to your side while in a flat bed without using bedrails?: A Lot Help needed moving from lying on your back to sitting on the side of a flat bed without using bedrails?: Total Help needed moving to and  from a bed to a chair (including a wheelchair)?: Total Help needed standing up from a chair using your arms (e.g., wheelchair or bedside chair)?: Total Help needed to walk in hospital room?: Total Help needed climbing 3-5 steps with a railing? : Total 6 Click Score: 7    End of Session   Activity Tolerance: Other (comment)(decr ability to participate) Patient left: in bed;with call bell/phone within reach;with bed alarm set;Other (comment)(bed in semi-chair position) Nurse Communication: Mobility status;Need for lift equipment PT Visit Diagnosis: Unsteadiness on feet (R26.81);Muscle weakness (generalized) (M62.81);Difficulty in walking, not elsewhere classified (R26.2)     Time: [id number] PT Time Calculation (min) (ACUTE ONLY): 25 min  Charges:  $Therapeutic Activity: 23-37 mins                     Roney Marion, PT  Acute Rehabilitation Services Pager [phone] Office [place] 09/04/2019, 4:19 PM

## 2019-09-05 LAB — COMPREHENSIVE METABOLIC PANEL
ALT: 16 U/L (ref 0–44)
AST: 21 U/L (ref 15–41)
Albumin: 2.7 g/dL — ABNORMAL LOW (ref 3.5–5.0)
Alkaline Phosphatase: 60 U/L (ref 38–126)
Anion gap: 10 (ref 5–15)
BUN: 23 mg/dL (ref 8–23)
CO2: 30 mmol/L (ref 22–32)
Calcium: 9.3 mg/dL (ref 8.9–10.3)
Chloride: 102 mmol/L (ref 98–111)
Creatinine, Ser: 1.16 mg/dL (ref 0.61–1.24)
GFR calc Af Amer: >60 mL/min (ref >60)
GFR calc non Af Amer: 60 mL/min — ABNORMAL LOW (ref >60)
Glucose, Bld: 151 mg/dL — ABNORMAL HIGH (ref 70–99)
Potassium: 3.7 mmol/L (ref 3.5–5.1)
Sodium: 142 mmol/L (ref 135–145)
Total Bilirubin: 1 mg/dL (ref 0.3–1.2)
Total Protein: 6 g/dL — ABNORMAL LOW (ref 6.5–8.1)

## 2019-09-05 LAB — PROTIME-INR
INR: 2.4 — ABNORMAL HIGH (ref 0.8–1.2)
Prothrombin Time: 25.4 seconds — ABNORMAL HIGH (ref 11.4–15.2)

## 2019-09-05 LAB — CBC
HCT: 31 % — ABNORMAL LOW (ref 39.0–52.0)
Hemoglobin: 9.6 g/dL — ABNORMAL LOW (ref 13.0–17.0)
MCH: 29.4 pg (ref 26.0–34.0)
MCHC: 31 g/dL (ref 30.0–36.0)
MCV: 94.8 fL (ref 80.0–100.0)
Platelets: 188 10*3/uL (ref 150–400)
RBC: 3.27 MIL/uL — ABNORMAL LOW (ref 4.22–5.81)
RDW: 17.1 % — ABNORMAL HIGH (ref 11.5–15.5)
WBC: 6.6 10*3/uL (ref 4.0–10.5)
nRBC: 0 % (ref 0.0–0.2)

## 2019-09-05 LAB — GLUCOSE, CAPILLARY
Glucose-Capillary: 164 mg/dL — ABNORMAL HIGH (ref 70–99)
Glucose-Capillary: 139 mg/dL — ABNORMAL HIGH (ref 70–99)
Glucose-Capillary: 122 mg/dL — ABNORMAL HIGH (ref 70–99)
Glucose-Capillary: 136 mg/dL — ABNORMAL HIGH (ref 70–99)
Glucose-Capillary: 170 mg/dL — ABNORMAL HIGH (ref 70–99)

## 2019-09-05 LAB — MAGNESIUM: Magnesium: 1.6 mg/dL — ABNORMAL LOW (ref 1.7–2.4)

## 2019-09-05 LAB — APTT: aPTT: 58 seconds — ABNORMAL HIGH (ref 24–36)

## 2019-09-05 LAB — AMMONIA: Ammonia: 16 umol/L (ref 9–35)

## 2019-09-05 LAB — HEPARIN LEVEL (UNFRACTIONATED): Heparin Unfractionated: 0.33 IU/mL (ref 0.30–0.70)

## 2019-09-05 MED ORDER — POLYETHYLENE GLYCOL 3350 17 G PO PACK
17.0000 g | PACK | Freq: Every day | ORAL | Status: DC
  Administered 2019-09-05 – 2019-09-06 (×2): 17 g via ORAL
  Filled 2019-09-05 (×2): qty 1

## 2019-09-05 MED ORDER — ADULT MULTIVITAMIN W/MINERALS CH
1.0000 | ORAL_TABLET | Freq: Every day | ORAL | Status: DC
  Administered 2019-09-05 – 2019-09-06 (×2): 1 via ORAL
  Filled 2019-09-05 (×3): qty 1

## 2019-09-05 MED ORDER — ENSURE ENLIVE PO LIQD
237.0000 mL | Freq: Three times a day (TID) | ORAL | Status: DC
  Administered 2019-09-05 – 2019-09-06 (×4): 237 mL via ORAL

## 2019-09-05 MED ORDER — WARFARIN SODIUM 7.5 MG PO TABS
7.5000 mg | ORAL_TABLET | Freq: Once | ORAL | Status: AC
  Administered 2019-09-05: 7.5 mg via ORAL
  Filled 2019-09-05: qty 1

## 2019-09-05 MED ORDER — MAGNESIUM SULFATE 2 GM/50ML IV SOLN
2.0000 g | Freq: Once | INTRAVENOUS | Status: AC
  Administered 2019-09-05: 2 g via INTRAVENOUS
  Filled 2019-09-05: qty 50

## 2019-09-05 NOTE — Care Management Important Message (Signed)
Important Message  Patient Details  Name: Charles [name] MRN: [id number] Date of Birth: [birth year]   Medicare Important Message Given:  Yes     Memory Argue 09/05/2019, 2:52 PM

## 2019-09-05 NOTE — Progress Notes (Signed)
Call back to Wife Dub Mikes. Updated on patient condition.

## 2019-09-05 NOTE — Progress Notes (Signed)
PROGRESS NOTE        PATIENT DETAILS Name: Charles [name] Age: 78 y.o. Sex: male Date of Birth: [birth year] Admit Date: 08/23/2019 Admitting Physician Collene Gobble, MD [id number], Evie Lacks, MD  Brief Narrative: Patient is a 78 y.o. male history of CAD, chronic diastolic heart failure, COPD, atrial fibrillation, history of CVA-who presented to the hospital for worsening shortness of breath, increasing lower extremity edema-patient was found to have acute hypoxemic respiratory failure in the setting of decompensated combined systolic/diastolic heart failure, acute renal failure.  Managed in the intensive care unit-required CRRT BiPAP support.  Further evaluation with an echo revealed LV thrombus.  Upon further stability-transferred to the hospitalist service on 9/15.  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  Assessment/Plan: Acute kidney injury: Likely hemodynamically mediated-briefly required CRRT while in the ICU-renal function has normalized-nephrology has signed off.    Acute hypoxemic respiratory failure: Likely secondary to decompensated combined systolic/diastolic heart failure in the setting of worsening renal function.  Briefly required BiPAP-required CRRT for volume removal-now on room air.  Volume status remains relatively stable-has been started on Demadex.   Acute on chronic systolic and diastolic heart failure (EF 45-50%) along with right ventricular failure: Volume status remains stable-has been resumed on Demadex.  Appreciate cardiology input.  LV thrombus: Was on overlapping IV heparin and Coumadin-INR is now therapeutic.  IV heparin has been discontinued.  Continue to monitor INR periodically.  Permanent atrial fibrillation: On Eliquis prior to this hospital stay-now transition to Coumadin.  Rate controlled with metoprolol.    CAD: No anginal symptoms  currently  Hematuria/acute urinary retention: Hematuria has resolved-Foley catheter was discontinued on 9/20 after discussion with urology.    Cellulitis of the left hip/abdomen along with MRSA UTI: Afebrile-improved-has completed a course of 7-day has completed a 7-day course of IV vancomycin.  Dementia with delirium: Continues to wax and wane delirium-he is awake and alert this morning. Per spouse-family has been suspecting dementia for a while.  Supportive care continues.  DM-2: CBGs stable-continue with SSI  Chronic fracture of the left humeral head: Orthopedics following-recommendations are for supportive care.  Debility/deconditioning: PT/OT eval-plans are for SNF on discharge-  S/p right TMA  Obesity: Estimated body mass index is 35.26 kg/m as calculated from the following:   Height as of this encounter: 6\' 4"  (1.93 m).   Weight as of this encounter: 131.4 kg.    Diet: Diet Order            Diet Heart Room service appropriate? Yes; Fluid consistency: Thin  Diet effective now               DVT Prophylaxis: Full dose anticoagulation with Coumadin  Code Status: Partial Code: DNI-no CPR-OK for meds  Family Communication: Spoke with spouse on 9/23.  Barriers to discharge: LV thrombus-has subtherapeutic INR-remains on overlapping IV heparin and Coumadin.    Disposition Plan: Remain inpatient-SNF on discharge hopefully on 9/24-we will send out COVID PCR today.  Antimicrobial agents: Anti-infectives (From admission, onward)   Start     Dose/Rate Route Frequency Ordered Stop   08/28/19 2200  vancomycin (VANCOCIN) 1,250 mg in sodium chloride 0.9 % 250 mL IVPB     1,250 mg 166.7 mL/hr over 90 Minutes Intravenous Every 24  hours 08/28/19 2144 08/30/19 2253   08/27/19 2100  ceFEPIme (MAXIPIME) 1 g in sodium chloride 0.9 % 100 mL IVPB  Status:  Discontinued     1 g 200 mL/hr over 30 Minutes Intravenous Every 24 hours 08/27/19 0729 08/27/19 1022   08/27/19 1800   vancomycin (VANCOCIN) 1,250 mg in sodium chloride 0.9 % 250 mL IVPB     1,250 mg 166.7 mL/hr over 90 Minutes Intravenous  Once 08/27/19 1750 08/27/19 1951   08/27/19 0726  vancomycin variable dose per unstable renal function (pharmacist dosing)  Status:  Discontinued      Does not apply See admin instructions 08/27/19 0726 08/29/19 1249   08/25/19 1600  vancomycin (VANCOCIN) 1,250 mg in sodium chloride 0.9 % 250 mL IVPB  Status:  Discontinued     1,250 mg 166.7 mL/hr over 90 Minutes Intravenous Every 24 hours 08/24/19 1803 08/27/19 0726   08/24/19 1815  ceFEPIme (MAXIPIME) 2 g in sodium chloride 0.9 % 100 mL IVPB  Status:  Discontinued     2 g 200 mL/hr over 30 Minutes Intravenous Every 12 hours 08/24/19 1803 08/27/19 0729   08/24/19 1545  ceFEPIme (MAXIPIME) 500 mg in dextrose 5 % 50 mL IVPB  Status:  Discontinued     500 mg 100 mL/hr over 30 Minutes Intravenous Every 24 hours 08/24/19 1537 08/24/19 1803   08/24/19 1545  vancomycin (VANCOCIN) 2,000 mg in sodium chloride 0.9 % 500 mL IVPB     2,000 mg 250 mL/hr over 120 Minutes Intravenous  Once 08/24/19 1537 08/24/19 1848   08/24/19 1537  vancomycin variable dose per unstable renal function (pharmacist dosing)  Status:  Discontinued      Does not apply See admin instructions 08/24/19 1537 08/24/19 1803   08/23/19 1730  cefTRIAXone (ROCEPHIN) 2 g in sodium chloride 0.9 % 100 mL IVPB  Status:  Discontinued     2 g 200 mL/hr over 30 Minutes Intravenous Every 24 hours 08/23/19 1721 08/24/19 1526   08/23/19 1730  doxycycline (VIBRAMYCIN) 100 mg in sodium chloride 0.9 % 250 mL IVPB     100 mg 125 mL/hr over 120 Minutes Intravenous  Once 08/23/19 1721 08/23/19 1956      Procedures:   CONSULTS:  cardiology, pulmonary/intensive care, nephrology and urology  Time spent: 25 minutes-Greater than 50% of this time was spent in counseling, explanation of diagnosis, planning of further management, and coordination of care.  MEDICATIONS:  Scheduled Meds: . Chlorhexidine Gluconate Cloth  6 each Topical Daily  . feeding supplement (ENSURE ENLIVE)  237 mL Oral BID BM  . feeding supplement (NEPRO CARB STEADY)  237 mL Oral TID BM  . finasteride  5 mg Oral Daily  . insulin aspart  0-9 Units Subcutaneous Q4H  . metoprolol succinate  25 mg Oral Daily  . mometasone-formoterol  2 puff Inhalation BID  . pravastatin  40 mg Oral q1800  . ranolazine  500 mg Oral BID  . torsemide  40 mg Oral Daily  . warfarin  7.5 mg Oral ONCE-1800  . warfarin   Does not apply Once  . Warfarin - Pharmacist Dosing Inpatient   Does not apply q1800   Continuous Infusions: . sodium chloride 500 mL/hr at 08/28/19 0946  . sodium chloride     PRN Meds:.acetaminophen, albuterol, ondansetron **OR** ondansetron (ZOFRAN) IV, sodium chloride flush   PHYSICAL EXAM: Vital signs: Vitals:   09/04/19 0945 09/04/19 1639 09/05/19 0503 09/05/19 0820  BP: 100/60 119/74 119/65 133/64  Pulse:  66 88 83 76  Resp: 18 18 18    Temp: 98.6 F (37 C)  98 F (36.7 C) 98.4 F (36.9 C)  TempSrc: Oral   Axillary  SpO2:    94%  Weight:      Height:       Filed Weights   08/28/19 0200 08/30/19 0417 08/31/19 0500  Weight: 130.1 kg 131.2 kg 131.4 kg   Body mass index is 35.26 kg/m.   Gen Exam:Alert awake-not in any distress.  Chronically sick appearing. HEENT:atraumatic, normocephalic Chest: B/L clear to auscultation anteriorly CVS:S1S2 regular Abdomen:soft non tender, non distended Extremities:no edema Neurology: Non focal-but with generalized weakness. Skin: no rash  I have personally reviewed following labs and imaging studies  LABORATORY DATA: CBC: Recent Labs  Lab 09/01/19 0646 09/02/19 0722 09/03/19 0652 09/04/19 0607 09/05/19 0744  WBC 7.4 6.5 8.3 7.1 6.6  HGB 9.3* 9.3* 9.1* 10.3* 9.6*  HCT 30.5* 28.7* 28.2* 32.6* 31.0*  MCV 96.5 94.7 94.9 96.2 94.8  PLT 168 168 184 192 0000000    Basic Metabolic Panel: Recent Labs  Lab 08/30/19 0730 08/31/19  0913 09/01/19 0646 09/02/19 0722 09/03/19 0652 09/04/19 0607 09/05/19 0744  NA 135 138 137 138  --   --  142  K 4.0 4.4 4.0 4.1  --   --  3.7  CL 102 102 102 103  --   --  102  CO2 22 27 24 25   --   --  30  GLUCOSE 121* 163* 128* 122*  --   --  151*  BUN 37* 30* 29* 28*  --   --  23  CREATININE 1.37* 1.27* 1.15 1.15  --   --  1.16  CALCIUM 8.9 9.0 8.9 8.9  --   --  9.3  MG 1.7 1.4* 1.3* 1.3* 1.4* 1.7 1.6*    GFR: Estimated Creatinine Clearance: 77.6 mL/min (by C-G formula based on SCr of 1.16 mg/dL).  Liver Function Tests: Recent Labs  Lab 09/05/19 0744  AST 21  ALT 16  ALKPHOS 60  BILITOT 1.0  PROT 6.0*  ALBUMIN 2.7*   No results for input(s): LIPASE, AMYLASE in the last 168 hours. Recent Labs  Lab 09/05/19 0744  AMMONIA 16    Coagulation Profile: Recent Labs  Lab 09/01/19 0646 09/02/19 0722 09/03/19 0652 09/04/19 0607 09/05/19 0744  INR 1.5* 1.8* 1.8* 1.7* 2.4*    Cardiac Enzymes: No results for input(s): CKTOTAL, CKMB, CKMBINDEX, TROPONINI in the last 168 hours.  BNP (last 3 results) No results for input(s): PROBNP in the last 8760 hours.  HbA1C: No results for input(s): HGBA1C in the last 72 hours.  CBG: Recent Labs  Lab 09/04/19 1126 09/04/19 1611 09/05/19 0059 09/05/19 0718 09/05/19 1124  GLUCAP 152* 185* 164* 139* 122*    Lipid Profile: No results for input(s): CHOL, HDL, LDLCALC, TRIG, CHOLHDL, LDLDIRECT in the last 72 hours.  Thyroid Function Tests: No results for input(s): TSH, T4TOTAL, FREET4, T3FREE, THYROIDAB in the last 72 hours.  Anemia Panel: No results for input(s): VITAMINB12, FOLATE, FERRITIN, TIBC, IRON, RETICCTPCT in the last 72 hours.  Urine analysis:    Component Value Date/Time   COLORURINE AMBER (A) 08/23/2019 2231   APPEARANCEUR HAZY (A) 08/23/2019 2231   LABSPEC 1.017 08/23/2019 2231   PHURINE 5.0 08/23/2019 2231   GLUCOSEU NEGATIVE 08/23/2019 2231   GLUCOSEU NEGATIVE 05/25/2019 1353   HGBUR MODERATE  (A) 08/23/2019 2231   BILIRUBINUR NEGATIVE 08/23/2019 [place] 08/23/2019  2231   PROTEINUR 100 (A) 08/23/2019 2231   UROBILINOGEN 1.0 05/25/2019 1353   NITRITE NEGATIVE 08/23/2019 2231   LEUKOCYTESUR SMALL (A) 08/23/2019 2231    Sepsis Labs: Lactic Acid, Venous    Component Value Date/Time   LATICACIDVEN 1.4 08/25/2019 0343    MICROBIOLOGY: No results found for this or any previous visit (from the past 240 hour(s)).  RADIOLOGY STUDIES/RESULTS: Dg Chest 1 View  Result Date: 08/24/2019 CLINICAL DATA:  78 year old male with central line placement. EXAM: CHEST  1 VIEW COMPARISON:  Chest radiograph dated 06/02 FINDINGS: Right IJ central venous line with tip close to the cavoatrial junction. No pneumothorax. There is a right pleural effusion with right lung base atelectasis. Infiltrate is not excluded. Clinical correlation is recommended. The left lung is clear. Stable cardiomegaly. Atherosclerotic calcification of the aortic arch. No acute osseous pathology. Osteopenia with degenerative changes of the spine. Old left humeral neck fracture. IMPRESSION: 1. Right IJ central venous line with tip close to the cavoatrial junction. No pneumothorax. 2. Right pleural effusion with right lung base atelectasis versus infiltrate. Electronically Signed   By: Anner Crete M.D.   On: 08/24/2019 18:32   US Renal  Result Date: 08/24/2019 CLINICAL DATA:  Acute renal failure EXAM: RENAL / URINARY TRACT ULTRASOUND COMPLETE COMPARISON:  None. FINDINGS: Somewhat limited exam due to overlying bowel gas. Right Kidney: Renal measurements: 13.3 x 6.6 x 6.7 = volume: 316 mL . Echogenicity within normal limits. No mass or hydronephrosis visualized. Left Kidney: Renal measurements: 11.8 x 6.2 x 7.0 = volume: 266 mL. Echogenicity within normal limits. No mass or hydronephrosis visualized. Bladder: The bladder is partially distended there is a heterogeneous nodular appearance to the prostate which  protrudes into the posterior bladder. IMPRESSION: Somewhat limited exam due to overlying bowel gas, however normal appearing kidneys. Enlarged nodular prostate protruding into the posterior bladder. Electronically Signed   By: Prudencio Pair M.D.   On: 08/24/2019 02:18   Dg Chest Port 1 View  Result Date: 08/25/2019 CLINICAL DATA:  CHF EXAM: PORTABLE CHEST 1 VIEW COMPARISON:  Yesterday FINDINGS: Shortening of the right IJ catheter, tip near the SVC brachiocephalic confluence. Cardiomegaly with haziness of the right chest from pleural effusion. No superimposed Kerley lines or air bronchogram. IMPRESSION: 1. Shorter right IJ catheter, tip near the SVC brachiocephalic confluence. 2. Cardiomegaly and right pleural effusion that is unchanged. Electronically Signed   By: Monte Fantasia M.D.   On: 08/25/2019 07:54   Dg Chest Port 1 View  Result Date: 08/23/2019 CLINICAL DATA:  Dyspnea EXAM: PORTABLE CHEST 1 VIEW COMPARISON:  06/01/2017 FINDINGS: Cardiomegaly. Mild pulmonary vascular congestion. Calcific aortic knob. Right basilar opacity. No pneumothorax. Advanced degenerative changes of the shoulders. IMPRESSION: 1. Right basilar opacity which may reflect pleural effusion with atelectasis and/or pneumonia. 2. Cardiomegaly and pulmonary vascular congestion suggesting CHF. Electronically Signed   By: Davina Poke M.D.   On: 08/23/2019 17:12   Dg Humerus Left  Result Date: 08/27/2019 CLINICAL DATA:  Recent fall. Left humerus pain. EXAM: LEFT HUMERUS - 2+ VIEW COMPARISON:  None. FINDINGS: An impacted fracture of the left humeral neck is seen. No evidence of shoulder dislocation. Generalized osteopenia is demonstrated. Degenerative spurring of acromioclavicular joint and distal acromion process noted. IMPRESSION: Impacted fracture of left humeral neck. Electronically Signed   By: Marlaine Hind M.D.   On: 08/27/2019 17:17     LOS: 13 days   Oren Binet, MD  [facility]  If 7PM-7AM, please  contact  night-coverage  Please page via [email or web address]  Go to [email or web address] and use [facility]'s universal password to access. If you do not have the password, please contact the hospital operator.  Locate the [facility] provider you are looking for under [facility] and page to a number that you can be directly reached. If you still have difficulty reaching the provider, please page the [facility] (Director on Call) for the Hospitalists listed on amion for assistance.  09/05/2019, 2:22 PM

## 2019-09-05 NOTE — Progress Notes (Signed)
Initial Nutrition Assessment  DOCUMENTATION CODES:   Obesity unspecified  INTERVENTION:   -Monitor for BM (day 9 without) -D/C Nepro   Increase Ensure Enlive po TID, each supplement provides 350 kcal and 20 grams of protein  MVI daily  NUTRITION DIAGNOSIS:   Inadequate oral intake related to poor appetite as evidenced by meal completion < 50%.  GOAL:   Patient will meet greater than or equal to 90% of their needs  MONITOR:   PO intake, Supplement acceptance, Weight trends, Labs, I & O's  REASON FOR ASSESSMENT:   LOS    ASSESSMENT:   Patient with PMH significant for CAD, HTN, CHF, DM, COPD, OSA, dementia, and prior CVA. Presents this admission with anasarca and ARF.   9/11- start CRRT 9/12- diet advanced regular, thin liquids  9/13- stop CRRT  Pt with dementia and unable to provide history. Intake has remained poor this admission. Meal completions charted as 0-25% for his last 6 meals. He drinks Ensure intermittently per nurse. Will increase supplementation. If intake remains poor may need to consider nutrition support if within [place]. Nursing unsure when his last BM was. Per chart records it's been 9 days without. MD to add bowel regimen.   Per chart records, pt weighed 151.6 kg on 11/22/18 and 128.2 kg this admission. Unsure how much is fluid related vs dry weight loss. Will need to obtain weight/nutrition history if possible.   I/O: -10,416 ml since admit UOP: 425 ml x 24 hrs   Medications: ss novolog, demadex Labs: Mg 1.6 (L) CBG 137-185   Diet Order:   Diet Order            Diet Heart Room service appropriate? Yes; Fluid consistency: Thin  Diet effective now              EDUCATION NEEDS:   Not appropriate for education at this time  Skin:  Skin Assessment: Skin Integrity Issues: Skin Integrity Issues:: Other (Comment) Other: wound- L foot, R leg  Last BM:  9/14  Height:   Ht Readings from Last 1 Encounters:  08/23/19 6\' 4"  (1.93 m)     Weight:   Wt Readings from Last 1 Encounters:  08/31/19 131.4 kg    Ideal Body Weight:  91.8 kg  BMI:  Body mass index is 35.26 kg/m.  Estimated Nutritional Needs:   Kcal:  2200-2400 kcal  Protein:  115-130 grams  Fluid:  >/= 2.2 L/day   Mariana Single RD, LDN Clinical Nutrition Pager # - [phone]

## 2019-09-05 NOTE — Progress Notes (Signed)
ANTICOAGULATION CONSULT NOTE  Pharmacy Consult for Heparin Indication: atrial fibrillation and LV Thrombus  Allergies  Allergen Reactions  . Tolterodine Swelling and Other (See Comments)    (Detrol) Dry mouth/dry throat, severe swelling of the legs, and started a "downward spiral"  . Lipitor [Atorvastatin] Other (See Comments)    Muscle aches  . Xarelto [Rivaroxaban] Rash    Patient Measurements: Height: 6\' 4"  (193 cm) Weight: 289 lb 11 oz (131.4 kg) IBW/kg (Calculated) : 86.8 Heparin Dosing Weight: 115 kg  Vital Signs: Temp: 98.4 F (36.9 C) (09/23 0820) Temp Source: Axillary (09/23 0820) BP: 133/64 (09/23 0820) Pulse Rate: 76 (09/23 0820)  Labs: Recent Labs    09/03/19 [id number] 09/04/19 0607 09/05/19 0744  HGB 9.1* 10.3* 9.6*  HCT 28.2* 32.6* 31.0*  PLT 184 192 188  APTT 88* 56* 58*  LABPROT 20.4* 19.9* 25.4*  INR 1.8* 1.7* 2.4*  HEPARINUNFRC 0.39 0.44 0.33  CREATININE  --   --  1.16    Estimated Creatinine Clearance: 77.6 mL/min (by C-G formula based on SCr of 1.16 mg/dL).  Assessment: 78 y.o. male with history of Afib on Eliquis PTA.  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  Pharmacy consulted to dose IV heparin bridge to warfarin.   Heparin level this morning remains therapeutic (HL 0.33, goal of 0.3-0.7). INR now therapeutic with higher dose given yesterday (INR 2.4 << 1.7, goal of 2-3). Hematuria appears to be resolved. CBC stable. Noted poor po intake so will monitor INR trends closely.  Goal of Therapy:  Heparin level 0.3-0.7 units/ml  INR goal 2-3 Monitor platelets by anticoagulation protocol: Yes   Plan:  - D/C Heparin drip - Warfarin 7.5 mg x 1 dose at 1800 today - Will continue to monitor for any signs/symptoms of bleeding and will follow up with  PT/INR in the a.m.   Kayelynn Abdou A. Levada Dy, PharmD, BCPS, FNKF Clinical Pharmacist [place] Please utilize Amion for appropriate phone number to reach the unit pharmacist ([place])    **Pharmacist phone directory can now be found on [email or web address] ([email or web address]).  Listed under [place].

## 2019-09-05 NOTE — Plan of Care (Signed)
  Problem: Education: Goal: Knowledge of General Education information will improve Description: Including pain rating scale, medication(s)/side effects and non-pharmacologic comfort measures Outcome: Not Progressing   Problem: Health Behavior/Discharge Planning: Goal: Ability to manage health-related needs will improve Outcome: Not Progressing   Problem: Nutrition: Goal: Adequate nutrition will be maintained Outcome: Not Progressing   

## 2019-09-05 NOTE — TOC Progression Note (Signed)
Transition of Care [facility]) - Progression Note    Patient Details  Name: Charles [name] MRN: [id number] Date of Birth: 28-May-1941  Transition of Care [facility]) CM/SW Contact  Bartholomew Crews, RN Phone Number: [phone] 09/05/2019, 11:57 AM  Clinical Narrative:    Spoke with spouse on the phone. Her first choice for SNF is [facility]. Notified facility - spoke with Levada Dy who advised that they can take him tomorrow pending negative Covid. Spoke with spouse to advise of anticipated transition tomorrow. Wife asked that facility be made aware of his extended rehab benefits with his supplement as listed on his demographics. SNF will follow up. If patient requires LTC, SNF to assist spouse with locating a LTC bed. Wife verbalized understanding. CM following for transition of care needs.     Expected Discharge Plan: [place] Barriers to Discharge: Continued Medical Work up  Expected Discharge Plan and Services Expected Discharge Plan: [place] In-house Referral: Clinical Social Work, [facility] Discharge Planning Services: CM Consult Post Acute Care Choice: [place] Living arrangements for the past 2 months: Single Family Home                                       Social Determinants of Health (SDOH) Interventions    Readmission Risk Interventions No flowsheet data found.

## 2019-09-06 LAB — MAGNESIUM: Magnesium: 1.7 mg/dL (ref 1.7–2.4)

## 2019-09-06 LAB — CBC
HCT: 31.8 % — ABNORMAL LOW (ref 39.0–52.0)
Hemoglobin: 9.6 g/dL — ABNORMAL LOW (ref 13.0–17.0)
MCH: 29.1 pg (ref 26.0–34.0)
MCHC: 30.2 g/dL (ref 30.0–36.0)
MCV: 96.4 fL (ref 80.0–100.0)
Platelets: 217 10*3/uL (ref 150–400)
RBC: 3.3 MIL/uL — ABNORMAL LOW (ref 4.22–5.81)
RDW: 17.1 % — ABNORMAL HIGH (ref 11.5–15.5)
WBC: 5.8 10*3/uL (ref 4.0–10.5)
nRBC: 0 % (ref 0.0–0.2)

## 2019-09-06 LAB — PROTIME-INR
INR: 1.5 — ABNORMAL HIGH (ref 0.8–1.2)
INR: 2.5 — ABNORMAL HIGH (ref 0.8–1.2)
Prothrombin Time: 18.2 seconds — ABNORMAL HIGH (ref 11.4–15.2)
Prothrombin Time: 26.9 seconds — ABNORMAL HIGH (ref 11.4–15.2)

## 2019-09-06 LAB — GLUCOSE, CAPILLARY
Glucose-Capillary: 112 mg/dL — ABNORMAL HIGH (ref 70–99)
Glucose-Capillary: 119 mg/dL — ABNORMAL HIGH (ref 70–99)
Glucose-Capillary: 122 mg/dL — ABNORMAL HIGH (ref 70–99)
Glucose-Capillary: 128 mg/dL — ABNORMAL HIGH (ref 70–99)
Glucose-Capillary: 143 mg/dL — ABNORMAL HIGH (ref 70–99)
Glucose-Capillary: 188 mg/dL — ABNORMAL HIGH (ref 70–99)

## 2019-09-06 LAB — NOVEL CORONAVIRUS, NAA (HOSP ORDER, SEND-OUT TO REF LAB; TAT 18-24 HRS): SARS-CoV-2, NAA: NOT DETECTED

## 2019-09-06 MED ORDER — WARFARIN SODIUM 7.5 MG PO TABS: 7.5000 mg | ORAL_TABLET | Freq: Every day | ORAL | Status: DC

## 2019-09-06 MED ORDER — FINASTERIDE 5 MG PO TABS: 5.0000 mg | ORAL_TABLET | Freq: Every day | ORAL | Status: DC

## 2019-09-06 MED ORDER — ENSURE ENLIVE PO LIQD: 237.0000 mL | Freq: Three times a day (TID) | ORAL | 12 refills | Status: DC

## 2019-09-06 MED ORDER — INSULIN ASPART 100 UNIT/ML ~~LOC~~ SOLN: SUBCUTANEOUS | 11 refills | Status: DC

## 2019-09-06 MED ORDER — TORSEMIDE 20 MG PO TABS: 40.0000 mg | ORAL_TABLET | Freq: Every day | ORAL | Status: DC

## 2019-09-06 MED ORDER — WARFARIN SODIUM 7.5 MG PO TABS
7.5000 mg | ORAL_TABLET | Freq: Once | ORAL | Status: AC
  Administered 2019-09-06: 7.5 mg via ORAL
  Filled 2019-09-06: qty 1

## 2019-09-06 MED ORDER — METOPROLOL SUCCINATE ER 25 MG PO TB24: 25.0000 mg | ORAL_TABLET | Freq: Every day | ORAL | Status: DC

## 2019-09-06 NOTE — TOC Progression Note (Signed)
Transition of Care [facility]) - Progression Note    Patient Details  Name: Charles [name] MRN: [id number] Date of Birth: Mar 31, 1941  Transition of Care [facility]) CM/SW Contact  Bartholomew Crews, RN Phone Number: [phone] 09/06/2019, 4:24 PM  Clinical Narrative:    Covid test still pending. Unable to transition patient to [facility] today. Spouse notified of delay. Anticipate completed results tomorrow.    Expected Discharge Plan: [place] Barriers to Discharge: Continued Medical Work up  Expected Discharge Plan and Services Expected Discharge Plan: [place] In-house Referral: Clinical Social Work, [facility] Discharge Planning Services: CM Consult Post Acute Care Choice: [place] arrangements for the past 2 months: Single Family Home Expected Discharge Date: 09/06/19                                     Social Determinants of Health (SDOH) Interventions    Readmission Risk Interventions No flowsheet data found.

## 2019-09-06 NOTE — Discharge Summary (Addendum)
PATIENT DETAILS Name: Charles [name] Age: 78 y.o. Sex: male Date of Birth: 1941/09/01 MRN: [id number]. Admitting Physician: Collene Gobble, MD [id number], Evie Lacks, MD  Admit Date: 08/23/2019 Discharge date: 09/06/2019  Recommendations for Outpatient Follow-up:  1. Follow up with PCP in 1-2 weeks 2. Check INR in 1-2 days, and adjust dosage of coumadin accordingly 3. Please obtain BMP/CBC in one week 4. Please follow up on the following pending results:  Admitted From:  Home  Disposition: SNF   Home Health: No  Equipment/Devices: None  Discharge Condition: Stable  CODE STATUS: Partial code-DO NOT INTUBATE-okay to give medications-no chest compressions/no intubation  Diet recommendation:  Diet Order            Diet - low sodium heart healthy        Diet Carb Modified        Diet Heart Room service appropriate? Yes; Fluid consistency: Thin  Diet effective now               Brief Summary: See H&P, Labs, Consult and Test reports for all details in brief, Patient is a 78 y.o. male history of CAD, chronic diastolic heart failure, COPD, atrial fibrillation, history of CVA-who presented to the hospital for worsening shortness of breath, increasing lower extremity edema-patient was found to have acute hypoxemic respiratory failure in the setting of decompensated combined systolic/diastolic heart failure, acute renal failure.  Managed in the intensive care unit-required CRRT BiPAP support.  Further evaluation with an echo revealed LV thrombus.  Upon further stability-transferred to the hospitalist service on 9/15.  Further hospital course was complicated by transient hematuria, delirium.  Hematuria resolved with supportive care-delirium is markedly improved.  See below for further details  Brief Hospital Course: Acute kidney injury: Likely hemodynamically mediated-briefly required CRRT while in the ICU-renal function has normalized-nephrology has signed off.     Acute hypoxemic respiratory failure: Likely secondary to decompensated combined systolic/diastolic heart failure in the setting of worsening renal function.  Briefly required BiPAP-required CRRT for volume removal-now on room air.  Volume status remains relatively stable-has been started on Demadex.   Acute on chronic systolic and diastolic heart failure (EF 45-50%) along with right ventricular failure: Volume status remains stable-has been resumed on Demadex.    Followed by cardiology during this hospital stay  LV thrombus: Was on overlapping IV heparin and Coumadin-INR is now therapeutic.  IV heparin has been discontinued.  Continue to monitor INR periodically-suggest repeat INR in the next 1-2 days and dose Coumadin accordingly.  Permanent atrial fibrillation: On Eliquis prior to this hospital stay-now transitioned to Coumadin.  Rate controlled with metoprolol.    CAD: No anginal symptoms currently  Hematuria/acute urinary retention: Hematuria has resolved-Foley catheter was discontinued on 9/20 after discussion with urology.    To new finasteride on discharge.  Cellulitis of the left hip/abdomen along with MRSA UTI: Afebrile-improved-has completed a course of 7-day has completed a 7-day course of IV vancomycin.  Dementia with delirium: Continues to wax and wane delirium-he is awake and alert this morning. Per spouse-family has been suspecting dementia for a while.  Supportive care continues.  DM-2: CBGs stable-continue with SSI  Chronic fracture of the left humeral head: Orthopedics following-recommendations are for supportive care.  Debility/deconditioning: PT/OT eval-plans are for SNF on discharge-when bed available  S/p right TMA  Obesity: Estimated body mass index is 35.26 kg/m as calculated from the following:   Height as of this encounter: 6\' 4"  (1.93 m).  Weight as of this encounter: 131.4 kg.   Nutrition Problem: Nutrition Problem: Inadequate oral  intake Etiology: poor appetite Signs/Symptoms: meal completion < 50% Interventions: Ensure Enlive (each supplement provides 350kcal and 20 grams of protein), MVI  Obesity: Estimated body mass index is 35.26 kg/m as calculated from the following:   Height as of this encounter: 6\' 4"  (1.93 m).   Weight as of this encounter: 131.4 kg.    Procedures/Studies: None  Discharge Diagnoses:  Principal Problem:   ARF (acute renal failure) ([name]) Active Problems:   CAD (coronary artery disease)   Type 2 diabetes, uncontrolled, with retinopathy ([name])   PAF (paroxysmal atrial fibrillation) ([name])   Shock ([name])   Hypotension   Acute on chronic combined systolic (congestive) and diastolic (congestive) heart failure ([name])   Lactic acidosis   Pneumonia due to infectious organism   CHF (congestive heart failure) ([name])   Persistent atrial fibrillation   Chronic cor pulmonale ([name])   PFO (patent foramen ovale)   Permanent atrial fibrillation   Discharge Instructions: Check CBGs before meals and at bedtime  Activity:  As tolerated with Full fall precautions use walker/cane & assistance as needed   Discharge Instructions    (HEART FAILURE PATIENTS) Call MD:  Anytime you have any of the following symptoms: 1) 3 pound weight gain in 24 hours or 5 pounds in 1 week 2) shortness of breath, with or without a dry hacking cough 3) swelling in the hands, feet or stomach 4) if you have to sleep on extra pillows at night in order to breathe.   Complete by: As directed    Call MD for:  difficulty breathing, headache or visual disturbances   Complete by: As directed    Call MD for:  persistant nausea and vomiting   Complete by: As directed    Diet - low sodium heart healthy   Complete by: As directed    Diet Carb Modified   Complete by: As directed    Discharge instructions   Complete by: As directed    Follow with Primary MD  Plotnikov, Evie Lacks, MD in 1-2 weeks  Please get a complete blood count  and chemistry panel checked by your Primary MD at your next visit, and again as instructed by your Primary MD.  Get Medicines reviewed and adjusted: Please take all your medications with you for your next visit with your Primary MD  Laboratory/radiological data: Please request your Primary MD to go over all hospital tests and procedure/radiological results at the follow up, please ask your Primary MD to get all Hospital records sent to his/her office.  In some cases, they will be blood work, cultures and biopsy results pending at the time of your discharge. Please request that your primary care M.D. follows up on these results.  Also Note the following: If you experience worsening of your admission symptoms, develop shortness of breath, life threatening emergency, suicidal or homicidal thoughts you must seek medical attention immediately by calling 911 or calling your MD immediately  if symptoms less severe.  You must read complete instructions/literature along with all the possible adverse reactions/side effects for all the Medicines you take and that have been prescribed to you. Take any new Medicines after you have completely understood and accpet all the possible adverse reactions/side effects.   Do not drive when taking Pain medications or sleeping medications (Benzodaizepines)  Do not take more than prescribed Pain, Sleep and Anxiety Medications. It is not advisable to combine anxiety,sleep  and pain medications without talking with your primary care practitioner  Special Instructions: If you have smoked or chewed Tobacco  in the last 2 yrs please stop smoking, stop any regular Alcohol  and or any Recreational drug use.  Wear Seat belts while driving.  Please note: You were cared for by a hospitalist during your hospital stay. Once you are discharged, your primary care physician will handle any further medical issues. Please note that NO REFILLS for any discharge medications will be  authorized once you are discharged, as it is imperative that you return to your primary care physician (or establish a relationship with a primary care physician if you do not have one) for your post hospital discharge needs so that they can reassess your need for medications and monitor your lab values.   Increase activity slowly   Complete by: As directed      Allergies as of 09/06/2019      Reactions   Tolterodine Swelling, Other (See Comments)   (Detrol) Dry mouth/dry throat, severe swelling of the legs, and started a "downward spiral"   Lipitor [atorvastatin] Other (See Comments)   Muscle aches   Xarelto [rivaroxaban] Rash      Medication List    STOP taking these medications   apixaban 5 MG Tabs tablet Commonly known as: ELIQUIS   aspirin EC 81 MG tablet   doxycycline 100 MG tablet Commonly known as: VIBRA-TABS   gabapentin 300 MG capsule Commonly known as: NEURONTIN   insulin aspart 100 UNIT/ML FlexPen Commonly known as: NOVOLOG Replaced by: insulin aspart 100 UNIT/ML injection   isosorbide mononitrate 60 MG 24 hr tablet Commonly known as: IMDUR   metoprolol tartrate 25 MG tablet Commonly known as: LOPRESSOR     TAKE these medications   acetaminophen 500 MG tablet Commonly known as: TYLENOL Take 1,000 mg by mouth at bedtime.   albuterol 108 (90 Base) MCG/ACT inhaler Commonly known as: VENTOLIN HFA Inhale 1-2 puffs into the lungs every 6 (six) hours as needed for wheezing or shortness of breath.   collagenase ointment Commonly known as: Santyl Apply 1 application topically daily.   feeding supplement (ENSURE ENLIVE) Liqd Take 237 mLs by mouth 3 (three) times daily between meals.   finasteride 5 MG tablet Commonly known as: PROSCAR Take 1 tablet (5 mg total) by mouth daily. Start taking on: September 07, 2019   Fluticasone-Salmeterol 100-50 MCG/DOSE Aepb Commonly known as: Advair Diskus Inhale 1 puff into the lungs 2 (two) times daily.   insulin  aspart 100 UNIT/ML injection Commonly known as: novoLOG 0-9 Units, Subcutaneous TID , with meals CBG < 70: Implement Hypoglycemia  Orders CBG 70 - 120: 0 units CBG 121 - 150: 1 unit CBG 151 - 200: 2 units CBG 201 - 250: 3 units CBG 251 - 300: 5 units CBG 301 - 350: 7 units CBG 351 - 400: 9 units CBG > 400: call MD Replaces: insulin aspart 100 UNIT/ML FlexPen   magnesium oxide 400 (241.3 Mg) MG tablet Commonly known as: MAG-OX Take 1 tablet (400 mg total) by mouth 2 (two) times daily.   metFORMIN 500 MG tablet Commonly known as: GLUCOPHAGE TAKE 1 TABLET TWICE DAILY WITH A MEAL What changed: See the new instructions.   metoprolol succinate 25 MG 24 hr tablet Commonly known as: TOPROL-XL Take 1 tablet (25 mg total) by mouth daily. Start taking on: September 07, 2019   mupirocin ointment 2 % Commonly known as: BACTROBAN Apply 1 application topically See  admin instructions. Apply to affected areas of bilateral feet, ankles, and legs once daily   nitroGLYCERIN 0.4 MG SL tablet Commonly known as: NITROSTAT DISSOLVE 1 TABLET (0.4 MG TOTAL) UNDER THE TONGUE EVERY 5 (FIVE) MINUTES AS NEEDED (UP TO 3 DOSES) FOR CHEST PAIN What changed: See the new instructions.   pravastatin 40 MG tablet Commonly known as: PRAVACHOL TAKE 1 TABLET EVERY DAY What changed: when to take this   PRESCRIPTION MEDICATION See admin instructions. CPAP- At bedtime   ranolazine 500 MG 12 hr tablet Commonly known as: RANEXA TAKE 1 TABLET TWICE DAILY   torsemide 20 MG tablet Commonly known as: DEMADEX Take 2 tablets (40 mg total) by mouth daily. What changed:   when to take this  additional instructions  Another medication with the same name was removed. Continue taking this medication, and follow the directions you see here.   triamcinolone ointment 0.5 % Commonly known as: KENALOG Apply 1 application topically 2 (two) times daily. What changed:   when to take this  reasons to take this     vitamin B-12 1000 MCG tablet Commonly known as: CYANOCOBALAMIN Take 1,000 mcg by mouth daily.   Vitamin D-3 25 MCG (1000 UT) Caps Take 1,000 Units by mouth daily.   warfarin 7.5 MG tablet Commonly known as: COUMADIN Take 1 tablet (7.5 mg total) by mouth daily at 6 PM.       Contact information for follow-up providers    Plotnikov, Evie Lacks, MD. Schedule an appointment as soon as possible for a visit in 1 week(s).   Specialty: Internal Medicine Contact information: [place] [address] [phone]        Croitoru, Dani Gobble, MD. Schedule an appointment as soon as possible for a visit in 2 week(s).   Specialty: Cardiology Contact information: [address] [place] [address] [phone]            Contact information for after-discharge care    Destination    [facility] Preferred SNF .   Service: Skilled Nursing Contact information: [place] [address] [phone]                 Allergies  Allergen Reactions   Tolterodine Swelling and Other (See Comments)    (Detrol) Dry mouth/dry throat, severe swelling of the legs, and started a "downward spiral"   Lipitor [Atorvastatin] Other (See Comments)    Muscle aches   Xarelto [Rivaroxaban] Rash    Consultations:   cardiology, pulmonary/intensive care and urology   Other Procedures/Studies: Dg Chest 1 View  Result Date: 08/24/2019 CLINICAL DATA:  78 year old male with central line placement. EXAM: CHEST  1 VIEW COMPARISON:  Chest radiograph dated 06/02 FINDINGS: Right IJ central venous line with tip close to the cavoatrial junction. No pneumothorax. There is a right pleural effusion with right lung base atelectasis. Infiltrate is not excluded. Clinical correlation is recommended. The left lung is clear. Stable cardiomegaly. Atherosclerotic calcification of the aortic arch. No acute osseous pathology. Osteopenia with  degenerative changes of the spine. Old left humeral neck fracture. IMPRESSION: 1. Right IJ central venous line with tip close to the cavoatrial junction. No pneumothorax. 2. Right pleural effusion with right lung base atelectasis versus infiltrate. Electronically Signed   By: Anner Crete M.D.   On: 08/24/2019 18:32   US Renal  Result Date: 08/24/2019 CLINICAL DATA:  Acute renal failure EXAM: RENAL / URINARY TRACT ULTRASOUND COMPLETE COMPARISON:  None. FINDINGS: Somewhat limited exam due to overlying bowel gas. Right Kidney: Renal measurements: 13.3 x 6.6 x 6.7 = volume: 316 mL . Echogenicity within normal limits. No mass or hydronephrosis visualized. Left Kidney: Renal measurements: 11.8 x 6.2 x 7.0 = volume: 266 mL. Echogenicity within normal limits. No mass or hydronephrosis visualized. Bladder: The bladder is partially distended there is a heterogeneous nodular appearance to the prostate which protrudes into the posterior bladder. IMPRESSION: Somewhat limited exam due to overlying bowel gas, however normal appearing kidneys. Enlarged nodular prostate protruding into the posterior bladder. Electronically Signed   By: Prudencio Pair M.D.   On: 08/24/2019 02:18   Dg Chest Port 1 View  Result Date: 08/25/2019 CLINICAL DATA:  CHF EXAM: PORTABLE CHEST 1 VIEW COMPARISON:  Yesterday FINDINGS: Shortening of the right IJ catheter, tip near the SVC brachiocephalic confluence. Cardiomegaly with haziness of the right chest from pleural effusion. No superimposed Kerley lines or air bronchogram. IMPRESSION: 1. Shorter right IJ catheter, tip near the SVC brachiocephalic confluence. 2. Cardiomegaly and right pleural effusion that is unchanged. Electronically Signed   By: Monte Fantasia M.D.   On: 08/25/2019 07:54   Dg Chest Port 1 View  Result Date: 08/23/2019 CLINICAL DATA:  Dyspnea EXAM: PORTABLE CHEST 1 VIEW COMPARISON:  06/01/2017 FINDINGS: Cardiomegaly. Mild pulmonary vascular congestion. Calcific aortic  knob. Right basilar opacity. No pneumothorax. Advanced degenerative changes of the shoulders. IMPRESSION: 1. Right basilar opacity which may reflect pleural effusion with atelectasis and/or pneumonia. 2. Cardiomegaly and pulmonary vascular congestion suggesting CHF. Electronically Signed   By: Davina Poke M.D.   On: 08/23/2019 17:12   Dg Humerus Left  Result Date: 08/27/2019 CLINICAL DATA:  Recent fall. Left humerus pain. EXAM: LEFT HUMERUS - 2+ VIEW COMPARISON:  None. FINDINGS: An impacted fracture of the left humeral neck is seen. No evidence of shoulder dislocation. Generalized osteopenia is demonstrated. Degenerative spurring of acromioclavicular joint and distal acromion process noted. IMPRESSION: Impacted fracture of left humeral neck. Electronically Signed   By: Marlaine Hind M.D.   On: 08/27/2019 17:17      TODAY-DAY OF DISCHARGE:  Subjective:   Charles [name] today has remained stable-no chest pain or shortness of breath overnight.  He is awake-mostly alert this morning.  Objective:   Blood pressure 118/75, pulse 77, temperature 98.8 F (37.1 C), temperature source Oral, resp. rate 18, height 6\' 4"  (1.93 m), weight 131.4 kg, SpO2 93 %.  Intake/Output Summary (Last 24 hours) at 09/06/2019 1315 Last data filed at 09/06/2019 0910 Gross per 24 hour  Intake 768 ml  Output 1175 ml  Net -407 ml   Filed Weights   08/28/19 0200 08/30/19 0417 08/31/19 0500  Weight: 130.1 kg 131.2 kg 131.4 kg    Exam: Awake Alert,  No new F.N deficits, Normal affect [place].AT,PERRAL Supple Neck,No JVD, No cervical lymphadenopathy appriciated.  Symmetrical Chest wall movement, Good air movement bilaterally, CTAB RRR,No Gallops,Rubs or new Murmurs, No Parasternal Heave +ve B.Sounds, Abd Soft, Non tender, No organomegaly appriciated, No rebound -guarding or rigidity. No Cyanosis, Clubbing or edema, No new Rash or bruise   PERTINENT RADIOLOGIC STUDIES: Dg Chest 1 View  Result Date:  08/24/2019 CLINICAL DATA:  78 year old male with central line placement. EXAM: CHEST  1 VIEW COMPARISON:  Chest radiograph dated 06/02 FINDINGS: Right IJ central venous line with tip close to the cavoatrial junction. No pneumothorax. There is a right pleural effusion with right lung base atelectasis. Infiltrate is not excluded. Clinical correlation is  recommended. The left lung is clear. Stable cardiomegaly. Atherosclerotic calcification of the aortic arch. No acute osseous pathology. Osteopenia with degenerative changes of the spine. Old left humeral neck fracture. IMPRESSION: 1. Right IJ central venous line with tip close to the cavoatrial junction. No pneumothorax. 2. Right pleural effusion with right lung base atelectasis versus infiltrate. Electronically Signed   By: Anner Crete M.D.   On: 08/24/2019 18:32   US Renal  Result Date: 08/24/2019 CLINICAL DATA:  Acute renal failure EXAM: RENAL / URINARY TRACT ULTRASOUND COMPLETE COMPARISON:  None. FINDINGS: Somewhat limited exam due to overlying bowel gas. Right Kidney: Renal measurements: 13.3 x 6.6 x 6.7 = volume: 316 mL . Echogenicity within normal limits. No mass or hydronephrosis visualized. Left Kidney: Renal measurements: 11.8 x 6.2 x 7.0 = volume: 266 mL. Echogenicity within normal limits. No mass or hydronephrosis visualized. Bladder: The bladder is partially distended there is a heterogeneous nodular appearance to the prostate which protrudes into the posterior bladder. IMPRESSION: Somewhat limited exam due to overlying bowel gas, however normal appearing kidneys. Enlarged nodular prostate protruding into the posterior bladder. Electronically Signed   By: Prudencio Pair M.D.   On: 08/24/2019 02:18   Dg Chest Port 1 View  Result Date: 08/25/2019 CLINICAL DATA:  CHF EXAM: PORTABLE CHEST 1 VIEW COMPARISON:  Yesterday FINDINGS: Shortening of the right IJ catheter, tip near the SVC brachiocephalic confluence. Cardiomegaly with haziness of the right  chest from pleural effusion. No superimposed Kerley lines or air bronchogram. IMPRESSION: 1. Shorter right IJ catheter, tip near the SVC brachiocephalic confluence. 2. Cardiomegaly and right pleural effusion that is unchanged. Electronically Signed   By: Monte Fantasia M.D.   On: 08/25/2019 07:54   Dg Chest Port 1 View  Result Date: 08/23/2019 CLINICAL DATA:  Dyspnea EXAM: PORTABLE CHEST 1 VIEW COMPARISON:  06/01/2017 FINDINGS: Cardiomegaly. Mild pulmonary vascular congestion. Calcific aortic knob. Right basilar opacity. No pneumothorax. Advanced degenerative changes of the shoulders. IMPRESSION: 1. Right basilar opacity which may reflect pleural effusion with atelectasis and/or pneumonia. 2. Cardiomegaly and pulmonary vascular congestion suggesting CHF. Electronically Signed   By: Davina Poke M.D.   On: 08/23/2019 17:12   Dg Humerus Left  Result Date: 08/27/2019 CLINICAL DATA:  Recent fall. Left humerus pain. EXAM: LEFT HUMERUS - 2+ VIEW COMPARISON:  None. FINDINGS: An impacted fracture of the left humeral neck is seen. No evidence of shoulder dislocation. Generalized osteopenia is demonstrated. Degenerative spurring of acromioclavicular joint and distal acromion process noted. IMPRESSION: Impacted fracture of left humeral neck. Electronically Signed   By: Marlaine Hind M.D.   On: 08/27/2019 17:17     PERTINENT LAB RESULTS: CBC: Recent Labs    09/05/19 0744 09/06/19 0442  WBC 6.6 5.8  HGB 9.6* 9.6*  HCT 31.0* 31.8*  PLT 188 217   CMET CMP     Component Value Date/Time   NA 142 09/05/2019 0744   NA 133 (A) 01/12/2017   K 3.7 09/05/2019 0744   CL 102 09/05/2019 0744   CO2 30 09/05/2019 0744   GLUCOSE 151 (H) 09/05/2019 0744   GLUCOSE 136 (H) 12/22/2006 0827   BUN 23 09/05/2019 0744   BUN 26 (A) 01/12/2017   CREATININE 1.16 09/05/2019 0744   CREATININE 1.17 08/30/2016 1014   CALCIUM 9.3 09/05/2019 0744   PROT 6.0 (L) 09/05/2019 0744   ALBUMIN 2.7 (L) 09/05/2019 0744   AST  21 09/05/2019 0744   ALT 16 09/05/2019 0744   ALKPHOS 60 09/05/2019  0744   BILITOT 1.0 09/05/2019 0744   GFRNONAA 60 (L) 09/05/2019 0744   GFRAA >60 09/05/2019 0744    GFR Estimated Creatinine Clearance: 77.6 mL/min (by C-G formula based on SCr of 1.16 mg/dL). No results for input(s): LIPASE, AMYLASE in the last 72 hours. No results for input(s): CKTOTAL, CKMB, CKMBINDEX, TROPONINI in the last 72 hours. Invalid input(s): POCBNP No results for input(s): DDIMER in the last 72 hours. No results for input(s): HGBA1C in the last 72 hours. No results for input(s): CHOL, HDL, LDLCALC, TRIG, CHOLHDL, LDLDIRECT in the last 72 hours. No results for input(s): TSH, T4TOTAL, T3FREE, THYROIDAB in the last 72 hours.  Invalid input(s): FREET3 No results for input(s): VITAMINB12, FOLATE, FERRITIN, TIBC, IRON, RETICCTPCT in the last 72 hours. Coags: Recent Labs    09/06/19 0442 09/06/19 0931  INR 1.5* 2.5*   Microbiology: No results found for this or any previous visit (from the past 240 hour(s)).  FURTHER DISCHARGE INSTRUCTIONS:  Get Medicines reviewed and adjusted: Please take all your medications with you for your next visit with your Primary MD  Laboratory/radiological data: Please request your Primary MD to go over all hospital tests and procedure/radiological results at the follow up, please ask your Primary MD to get all Hospital records sent to his/her office.  In some cases, they will be blood work, cultures and biopsy results pending at the time of your discharge. Please request that your primary care M.D. goes through all the records of your hospital data and follows up on these results.  Also Note the following: If you experience worsening of your admission symptoms, develop shortness of breath, life threatening emergency, suicidal or homicidal thoughts you must seek medical attention immediately by calling 911 or calling your MD immediately  if symptoms less severe.  You must  read complete instructions/literature along with all the possible adverse reactions/side effects for all the Medicines you take and that have been prescribed to you. Take any new Medicines after you have completely understood and accpet all the possible adverse reactions/side effects.   Do not drive when taking Pain medications or sleeping medications (Benzodaizepines)  Do not take more than prescribed Pain, Sleep and Anxiety Medications. It is not advisable to combine anxiety,sleep and pain medications without talking with your primary care practitioner  Special Instructions: If you have smoked or chewed Tobacco  in the last 2 yrs please stop smoking, stop any regular Alcohol  and or any Recreational drug use.  Wear Seat belts while driving.  Please note: You were cared for by a hospitalist during your hospital stay. Once you are discharged, your primary care physician will handle any further medical issues. Please note that NO REFILLS for any discharge medications will be authorized once you are discharged, as it is imperative that you return to your primary care physician (or establish a relationship with a primary care physician if you do not have one) for your post hospital discharge needs so that they can reassess your need for medications and monitor your lab values.  Total Time spent coordinating discharge including counseling, education and face to face time equals  45 minutes.  Signed: Jamoni Broadfoot 09/06/2019 1:15 PM

## 2019-09-06 NOTE — Progress Notes (Signed)
ANTICOAGULATION CONSULT NOTE  Pharmacy Consult for Warfarin Indication: atrial fibrillation and LV Thrombus  Allergies  Allergen Reactions  . Tolterodine Swelling and Other (See Comments)    (Detrol) Dry mouth/dry throat, severe swelling of the legs, and started a "downward spiral"  . Lipitor [Atorvastatin] Other (See Comments)    Muscle aches  . Xarelto [Rivaroxaban] Rash    Patient Measurements: Height: 6\' 4"  (193 cm) Weight: 289 lb 11 oz (131.4 kg) IBW/kg (Calculated) : 86.8 Heparin Dosing Weight: 115 kg  Vital Signs: Temp: 98.8 F (37.1 C) (09/24 0954) Temp Source: Oral (09/24 0954) BP: 118/75 (09/24 0954) Pulse Rate: 77 (09/24 0954)  Labs: Recent Labs    09/04/19 0607 09/05/19 0744 09/06/19 0442 09/06/19 0931  HGB 10.3* 9.6* 9.6*  --   HCT 32.6* 31.0* 31.8*  --   PLT 192 188 217  --   APTT 56* 58*  --   --   LABPROT 19.9* 25.4* 18.2* 26.9*  INR 1.7* 2.4* 1.5* 2.5*  HEPARINUNFRC 0.44 0.33  --   --   CREATININE  --  1.16  --   --     Estimated Creatinine Clearance: 77.6 mL/min (by C-G formula based on SCr of 1.16 mg/dL).  Assessment: 78 y.o. male with history of Afib on Eliquis PTA.  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  Pharmacy consulted to dose warfarin.    INR now therapeutic (2.5, goal of 2-3). Initial INR this AM of 1.5 appears to be in error. Hematuria appears to be resolved. CBC stable. Noted poor po intake so will monitor INR trends closely.  Goal of Therapy:  INR goal 2-3 Monitor platelets by anticoagulation protocol: Yes   Plan:  - Warfarin 7.5 mg x 1 dose at 1800 today - Will continue to monitor for any signs/symptoms of bleeding and will follow up with  PT/INR in the a.m.   Deshae Dickison A. Levada Dy, PharmD, BCPS, FNKF Clinical Pharmacist [place] Please utilize Amion for appropriate phone number to reach the unit pharmacist ([place])    **Pharmacist phone directory can now be found on [email or web address] ([email or web address]).   Listed under [place].

## 2019-09-06 NOTE — Plan of Care (Signed)
  Problem: Education: Goal: Knowledge of General Education information will improve Description Including pain rating scale, medication(s)/side effects and non-pharmacologic comfort measures Outcome: Progressing   

## 2019-09-07 DIAGNOSIS — L03311 Cellulitis of abdominal wall: Secondary | ICD-10-CM | POA: Diagnosis not present

## 2019-09-07 DIAGNOSIS — J449 Chronic obstructive pulmonary disease, unspecified: Secondary | ICD-10-CM | POA: Diagnosis present

## 2019-09-07 DIAGNOSIS — G4733 Obstructive sleep apnea (adult) (pediatric): Secondary | ICD-10-CM | POA: Diagnosis present

## 2019-09-07 DIAGNOSIS — R0600 Dyspnea, unspecified: Secondary | ICD-10-CM | POA: Diagnosis not present

## 2019-09-07 DIAGNOSIS — I4821 Permanent atrial fibrillation: Secondary | ICD-10-CM | POA: Diagnosis not present

## 2019-09-07 DIAGNOSIS — N400 Enlarged prostate without lower urinary tract symptoms: Secondary | ICD-10-CM | POA: Diagnosis not present

## 2019-09-07 DIAGNOSIS — I4891 Unspecified atrial fibrillation: Secondary | ICD-10-CM | POA: Diagnosis not present

## 2019-09-07 DIAGNOSIS — F29 Unspecified psychosis not due to a substance or known physiological condition: Secondary | ICD-10-CM | POA: Diagnosis not present

## 2019-09-07 DIAGNOSIS — I5041 Acute combined systolic (congestive) and diastolic (congestive) heart failure: Secondary | ICD-10-CM | POA: Diagnosis not present

## 2019-09-07 DIAGNOSIS — I2781 Cor pulmonale (chronic): Secondary | ICD-10-CM | POA: Diagnosis not present

## 2019-09-07 DIAGNOSIS — G9341 Metabolic encephalopathy: Secondary | ICD-10-CM | POA: Diagnosis not present

## 2019-09-07 DIAGNOSIS — N179 Acute kidney failure, unspecified: Secondary | ICD-10-CM | POA: Diagnosis not present

## 2019-09-07 DIAGNOSIS — J189 Pneumonia, unspecified organism: Secondary | ICD-10-CM | POA: Diagnosis not present

## 2019-09-07 DIAGNOSIS — M84452A Pathological fracture, left femur, initial encounter for fracture: Secondary | ICD-10-CM | POA: Diagnosis present

## 2019-09-07 DIAGNOSIS — R413 Other amnesia: Secondary | ICD-10-CM | POA: Diagnosis present

## 2019-09-07 DIAGNOSIS — I2510 Atherosclerotic heart disease of native coronary artery without angina pectoris: Secondary | ICD-10-CM | POA: Diagnosis not present

## 2019-09-07 DIAGNOSIS — R278 Other lack of coordination: Secondary | ICD-10-CM | POA: Diagnosis not present

## 2019-09-07 DIAGNOSIS — R0789 Other chest pain: Secondary | ICD-10-CM | POA: Diagnosis not present

## 2019-09-07 DIAGNOSIS — I501 Left ventricular failure: Secondary | ICD-10-CM | POA: Diagnosis not present

## 2019-09-07 DIAGNOSIS — Z7401 Bed confinement status: Secondary | ICD-10-CM | POA: Diagnosis not present

## 2019-09-07 DIAGNOSIS — R627 Adult failure to thrive: Secondary | ICD-10-CM | POA: Diagnosis present

## 2019-09-07 DIAGNOSIS — Q211 Atrial septal defect: Secondary | ICD-10-CM | POA: Diagnosis not present

## 2019-09-07 DIAGNOSIS — Z888 Allergy status to other drugs, medicaments and biological substances status: Secondary | ICD-10-CM | POA: Diagnosis not present

## 2019-09-07 DIAGNOSIS — R579 Shock, unspecified: Secondary | ICD-10-CM | POA: Diagnosis not present

## 2019-09-07 DIAGNOSIS — D6869 Other thrombophilia: Secondary | ICD-10-CM | POA: Diagnosis not present

## 2019-09-07 DIAGNOSIS — J9601 Acute respiratory failure with hypoxia: Secondary | ICD-10-CM | POA: Diagnosis not present

## 2019-09-07 DIAGNOSIS — M8580 Other specified disorders of bone density and structure, unspecified site: Secondary | ICD-10-CM | POA: Diagnosis present

## 2019-09-07 DIAGNOSIS — Z7189 Other specified counseling: Secondary | ICD-10-CM | POA: Diagnosis not present

## 2019-09-07 DIAGNOSIS — H9190 Unspecified hearing loss, unspecified ear: Secondary | ICD-10-CM | POA: Diagnosis present

## 2019-09-07 DIAGNOSIS — I480 Paroxysmal atrial fibrillation: Secondary | ICD-10-CM | POA: Diagnosis not present

## 2019-09-07 DIAGNOSIS — E1165 Type 2 diabetes mellitus with hyperglycemia: Secondary | ICD-10-CM | POA: Diagnosis not present

## 2019-09-07 DIAGNOSIS — G309 Alzheimer's disease, unspecified: Secondary | ICD-10-CM | POA: Diagnosis not present

## 2019-09-07 DIAGNOSIS — I4819 Other persistent atrial fibrillation: Secondary | ICD-10-CM | POA: Diagnosis not present

## 2019-09-07 DIAGNOSIS — R0602 Shortness of breath: Secondary | ICD-10-CM | POA: Diagnosis not present

## 2019-09-07 DIAGNOSIS — E785 Hyperlipidemia, unspecified: Secondary | ICD-10-CM | POA: Diagnosis present

## 2019-09-07 DIAGNOSIS — I110 Hypertensive heart disease with heart failure: Secondary | ICD-10-CM | POA: Diagnosis present

## 2019-09-07 DIAGNOSIS — K5900 Constipation, unspecified: Secondary | ICD-10-CM | POA: Diagnosis not present

## 2019-09-07 DIAGNOSIS — E669 Obesity, unspecified: Secondary | ICD-10-CM | POA: Diagnosis not present

## 2019-09-07 DIAGNOSIS — M2550 Pain in unspecified joint: Secondary | ICD-10-CM | POA: Diagnosis not present

## 2019-09-07 DIAGNOSIS — R600 Localized edema: Secondary | ICD-10-CM | POA: Diagnosis not present

## 2019-09-07 DIAGNOSIS — R21 Rash and other nonspecific skin eruption: Secondary | ICD-10-CM | POA: Diagnosis not present

## 2019-09-07 DIAGNOSIS — R410 Disorientation, unspecified: Secondary | ICD-10-CM | POA: Diagnosis not present

## 2019-09-07 DIAGNOSIS — R109 Unspecified abdominal pain: Secondary | ICD-10-CM | POA: Diagnosis not present

## 2019-09-07 DIAGNOSIS — I10 Essential (primary) hypertension: Secondary | ICD-10-CM | POA: Diagnosis not present

## 2019-09-07 DIAGNOSIS — M6281 Muscle weakness (generalized): Secondary | ICD-10-CM | POA: Diagnosis not present

## 2019-09-07 DIAGNOSIS — E872 Acidosis: Secondary | ICD-10-CM | POA: Diagnosis not present

## 2019-09-07 DIAGNOSIS — R05 Cough: Secondary | ICD-10-CM | POA: Diagnosis not present

## 2019-09-07 DIAGNOSIS — J80 Acute respiratory distress syndrome: Secondary | ICD-10-CM | POA: Diagnosis not present

## 2019-09-07 DIAGNOSIS — M25522 Pain in left elbow: Secondary | ICD-10-CM | POA: Diagnosis not present

## 2019-09-07 DIAGNOSIS — Z8673 Personal history of transient ischemic attack (TIA), and cerebral infarction without residual deficits: Secondary | ICD-10-CM | POA: Diagnosis not present

## 2019-09-07 DIAGNOSIS — E876 Hypokalemia: Secondary | ICD-10-CM | POA: Diagnosis present

## 2019-09-07 DIAGNOSIS — R000 Tachycardia, unspecified: Secondary | ICD-10-CM | POA: Diagnosis not present

## 2019-09-07 DIAGNOSIS — I509 Heart failure, unspecified: Secondary | ICD-10-CM | POA: Diagnosis not present

## 2019-09-07 DIAGNOSIS — Z87891 Personal history of nicotine dependence: Secondary | ICD-10-CM | POA: Diagnosis not present

## 2019-09-07 DIAGNOSIS — I513 Intracardiac thrombosis, not elsewhere classified: Secondary | ICD-10-CM | POA: Diagnosis not present

## 2019-09-07 DIAGNOSIS — E1122 Type 2 diabetes mellitus with diabetic chronic kidney disease: Secondary | ICD-10-CM | POA: Diagnosis not present

## 2019-09-07 DIAGNOSIS — E11319 Type 2 diabetes mellitus with unspecified diabetic retinopathy without macular edema: Secondary | ICD-10-CM | POA: Diagnosis not present

## 2019-09-07 DIAGNOSIS — I959 Hypotension, unspecified: Secondary | ICD-10-CM | POA: Diagnosis not present

## 2019-09-07 DIAGNOSIS — I5043 Acute on chronic combined systolic (congestive) and diastolic (congestive) heart failure: Secondary | ICD-10-CM | POA: Diagnosis not present

## 2019-09-07 DIAGNOSIS — Z515 Encounter for palliative care: Secondary | ICD-10-CM | POA: Diagnosis present

## 2019-09-07 DIAGNOSIS — Z20828 Contact with and (suspected) exposure to other viral communicable diseases: Secondary | ICD-10-CM | POA: Diagnosis present

## 2019-09-07 DIAGNOSIS — F0391 Unspecified dementia with behavioral disturbance: Secondary | ICD-10-CM | POA: Diagnosis present

## 2019-09-07 DIAGNOSIS — I252 Old myocardial infarction: Secondary | ICD-10-CM | POA: Diagnosis not present

## 2019-09-07 DIAGNOSIS — Z66 Do not resuscitate: Secondary | ICD-10-CM | POA: Diagnosis present

## 2019-09-07 DIAGNOSIS — E1140 Type 2 diabetes mellitus with diabetic neuropathy, unspecified: Secondary | ICD-10-CM | POA: Diagnosis present

## 2019-09-07 DIAGNOSIS — F40240 Claustrophobia: Secondary | ICD-10-CM | POA: Diagnosis present

## 2019-09-07 LAB — GLUCOSE, CAPILLARY
Glucose-Capillary: 108 mg/dL — ABNORMAL HIGH (ref 70–99)
Glucose-Capillary: 119 mg/dL — ABNORMAL HIGH (ref 70–99)
Glucose-Capillary: 124 mg/dL — ABNORMAL HIGH (ref 70–99)

## 2019-09-07 MED ORDER — MUPIROCIN CALCIUM 2 % EX CREA: TOPICAL_CREAM | Freq: Two times a day (BID) | CUTANEOUS | 0 refills | Status: DC

## 2019-09-07 MED ORDER — MUPIROCIN CALCIUM 2 % EX CREA
TOPICAL_CREAM | Freq: Two times a day (BID) | CUTANEOUS | Status: DC
  Filled 2019-09-07: qty 15

## 2019-09-07 NOTE — Progress Notes (Signed)
Pt pulled out his midline. On call provider made aware. RN will continue to monitor.

## 2019-09-07 NOTE — Consult Note (Signed)
   [facility] Inpatient Consult   09/07/2019  Charles [name] [birth year] [id number]    Patient screened for potential [facility] Care Management services for a 30 day readmission and 2 hospitalizations in the past 6 months with 20% risk score for unplanned readmission, for this Medicare/ [employer] member.  Chart reviewed and MD brief summary show as follows: Patient is a75 y.o.malehistory of CAD, chronic diastolic heart failure, COPD, atrial fibrillation, history of CVA,   who presented to the hospital for worsening shortness of breath, increasing lower extremity edema-patient was found to have Acute hypoxemic respiratory failure in the setting of decompensated combined systolic/diastolic heart failure, acute renal failure. Managed in the intensive care unit-required CRRT BiPAP support. Further evaluation with an echo revealed LV thrombus. Further hospital course was complicated by transient hematuria, delirium.  Hematuria resolved with supportive care-delirium is markedly improved.  Noted current discharge plan is for skilled nursing facility (SNF- [facility]) pending Covid test result, per transition of care CM note. Wife Charles [name]) now thinking that patient will eventually need long term care.    If patient's post hospital needs change, please place a [facility] Care Management consult.   Plan: [facility] post acute RN coordinator will be made aware of patient's disposition for any [facility] Care management post discharge follow up needs.   For questions and referral, please contact:  Ximenna Fonseca A. Lamyah Creed, BSN, RN-BC [facility] Liaison Cell: [phone]

## 2019-09-07 NOTE — Plan of Care (Signed)
DISCHARGE NOTE SNF Bonnielee Haff to be discharged [facility] per MD order. Spouse verbalized understanding.  Skin clean, dry and intact other than charted break down, skin tears noted and reported to receiving nurse. IV catheter discontinued intact. Site without signs and symptoms of complications. Dressing and pressure applied. Pt denies pain at the site currently. No complaints noted.  Patient free of lines and drains.   Discharge packet assembled. An After Visit Summary (AVS) was printed and given to the EMS personnel. Patient escorted via stretcher and discharged to [facility] via ambulance. Report called to [facility] at accepting facility; all questions and concerns addressed.   Stephan Minister, RN

## 2019-09-07 NOTE — Progress Notes (Signed)
Arrived to room for DBIV. Midline not present; noted documentation that pt removed midline. Lab notified pt will need venipuncture for am labs.

## 2019-09-07 NOTE — TOC Transition Note (Signed)
Transition of Care [facility]) - CM/SW Discharge Note   Patient Details  Name: Charles [name] MRN: [id number] Date of Birth: 03-Sep-1941  Transition of [facility]) CM/SW Contact:  Bartholomew Crews, RN Phone Number: [phone] 09/07/2019, 10:56 AM   Clinical Narrative:    Spoke with spouse, Charles [name], on the phone. Patient will discharge to [facility] today. Covid negative. PTAR notified. Patient assigned to room 301. RN to call report to [phone].    Final next level of care: Skilled Nursing Facility Barriers to Discharge: No Barriers Identified   Patient Goals and CMS Choice Patient states their goals for this hospitalization and ongoing recovery are:: get stronger - what is new baseline CMS Medicare.gov Compare Post Acute Care list provided to:: Patient Represenative (must comment) Choice offered to / list presented to : Spouse  Discharge Placement   Existing PASRR number confirmed : 09/03/19          Patient chooses bed at: [place] Patient to be transferred to facility by: [place] Name of family member notified: Charles [name]    Discharge Plan and Services In-house Referral: Clinical Social Work, [facility] Discharge Planning Services: CM Consult Post Acute Care Choice: [facility]          DME Arranged: N/A DME Agency: NA       HH Arranged: NA HH Agency: NA        Social Determinants of Health (SDOH) Interventions     Readmission Risk Interventions No flowsheet data found.

## 2019-09-07 NOTE — Progress Notes (Signed)
Patient seen and examined this morning.  He is alert awake able to tell me his name, date of birth follows some commands. He has been like this for few days and slowly improving I did call patient's wife discussed with her about his overall care including his mental status she feels comfortable at this time for him to go to skilled nursing rehab. Discussed with the previous attending. Nursing noticed eruption in the scalp popped open with some blood will continue on mupirocin/local wound care at the skilled nursing rehab.  Wife reports that he has had the scalp eruption for a while Patient will be discharged to skilled nursing facility today. Discussed with case Licensed conveyancer.

## 2019-09-12 DIAGNOSIS — D6869 Other thrombophilia: Secondary | ICD-10-CM | POA: Diagnosis not present

## 2019-09-12 DIAGNOSIS — G309 Alzheimer's disease, unspecified: Secondary | ICD-10-CM | POA: Diagnosis not present

## 2019-09-12 DIAGNOSIS — L03311 Cellulitis of abdominal wall: Secondary | ICD-10-CM | POA: Diagnosis not present

## 2019-09-12 DIAGNOSIS — I513 Intracardiac thrombosis, not elsewhere classified: Secondary | ICD-10-CM | POA: Diagnosis not present

## 2019-09-12 DIAGNOSIS — I5041 Acute combined systolic (congestive) and diastolic (congestive) heart failure: Secondary | ICD-10-CM | POA: Diagnosis not present

## 2019-09-12 DIAGNOSIS — E1122 Type 2 diabetes mellitus with diabetic chronic kidney disease: Secondary | ICD-10-CM | POA: Diagnosis not present

## 2019-09-12 DIAGNOSIS — J9601 Acute respiratory failure with hypoxia: Secondary | ICD-10-CM | POA: Diagnosis not present

## 2019-09-12 DIAGNOSIS — I4891 Unspecified atrial fibrillation: Secondary | ICD-10-CM | POA: Diagnosis not present

## 2019-09-16 DIAGNOSIS — I501 Left ventricular failure: Secondary | ICD-10-CM | POA: Diagnosis not present

## 2019-09-16 DIAGNOSIS — G309 Alzheimer's disease, unspecified: Secondary | ICD-10-CM | POA: Diagnosis not present

## 2019-09-16 DIAGNOSIS — D6869 Other thrombophilia: Secondary | ICD-10-CM | POA: Diagnosis not present

## 2019-09-16 DIAGNOSIS — I4891 Unspecified atrial fibrillation: Secondary | ICD-10-CM | POA: Diagnosis not present

## 2019-09-18 ENCOUNTER — Emergency Department (HOSPITAL_COMMUNITY): Payer: Medicare Other

## 2019-09-18 ENCOUNTER — Inpatient Hospital Stay (HOSPITAL_COMMUNITY): Payer: Medicare Other

## 2019-09-18 ENCOUNTER — Other Ambulatory Visit: Payer: Self-pay

## 2019-09-18 ENCOUNTER — Encounter (HOSPITAL_COMMUNITY): Payer: Self-pay

## 2019-09-18 ENCOUNTER — Inpatient Hospital Stay (HOSPITAL_COMMUNITY)
Admission: EM | Admit: 2019-09-18 | Discharge: 2019-09-20 | DRG: 189 | Disposition: A | Discharge status: Hospice | Payer: Medicare Other | Attending: Internal Medicine | Admitting: Internal Medicine

## 2019-09-18 DIAGNOSIS — Z8673 Personal history of transient ischemic attack (TIA), and cerebral infarction without residual deficits: Secondary | ICD-10-CM

## 2019-09-18 DIAGNOSIS — M2550 Pain in unspecified joint: Secondary | ICD-10-CM | POA: Diagnosis not present

## 2019-09-18 DIAGNOSIS — I4821 Permanent atrial fibrillation: Secondary | ICD-10-CM | POA: Diagnosis not present

## 2019-09-18 DIAGNOSIS — E785 Hyperlipidemia, unspecified: Secondary | ICD-10-CM | POA: Diagnosis present

## 2019-09-18 DIAGNOSIS — I10 Essential (primary) hypertension: Secondary | ICD-10-CM | POA: Diagnosis present

## 2019-09-18 DIAGNOSIS — M8580 Other specified disorders of bone density and structure, unspecified site: Secondary | ICD-10-CM | POA: Diagnosis present

## 2019-09-18 DIAGNOSIS — I252 Old myocardial infarction: Secondary | ICD-10-CM

## 2019-09-18 DIAGNOSIS — F29 Unspecified psychosis not due to a substance or known physiological condition: Secondary | ICD-10-CM | POA: Diagnosis not present

## 2019-09-18 DIAGNOSIS — E1140 Type 2 diabetes mellitus with diabetic neuropathy, unspecified: Secondary | ICD-10-CM | POA: Diagnosis present

## 2019-09-18 DIAGNOSIS — F40240 Claustrophobia: Secondary | ICD-10-CM | POA: Diagnosis present

## 2019-09-18 DIAGNOSIS — I509 Heart failure, unspecified: Secondary | ICD-10-CM | POA: Diagnosis not present

## 2019-09-18 DIAGNOSIS — J449 Chronic obstructive pulmonary disease, unspecified: Secondary | ICD-10-CM | POA: Diagnosis present

## 2019-09-18 DIAGNOSIS — Z87891 Personal history of nicotine dependence: Secondary | ICD-10-CM | POA: Diagnosis not present

## 2019-09-18 DIAGNOSIS — F0391 Unspecified dementia with behavioral disturbance: Secondary | ICD-10-CM | POA: Diagnosis present

## 2019-09-18 DIAGNOSIS — R0602 Shortness of breath: Secondary | ICD-10-CM | POA: Diagnosis not present

## 2019-09-18 DIAGNOSIS — Z8739 Personal history of other diseases of the musculoskeletal system and connective tissue: Secondary | ICD-10-CM | POA: Diagnosis not present

## 2019-09-18 DIAGNOSIS — I5043 Acute on chronic combined systolic (congestive) and diastolic (congestive) heart failure: Secondary | ICD-10-CM | POA: Diagnosis present

## 2019-09-18 DIAGNOSIS — Z85828 Personal history of other malignant neoplasm of skin: Secondary | ICD-10-CM

## 2019-09-18 DIAGNOSIS — Z888 Allergy status to other drugs, medicaments and biological substances status: Secondary | ICD-10-CM | POA: Diagnosis not present

## 2019-09-18 DIAGNOSIS — R0902 Hypoxemia: Secondary | ICD-10-CM | POA: Diagnosis not present

## 2019-09-18 DIAGNOSIS — G4733 Obstructive sleep apnea (adult) (pediatric): Secondary | ICD-10-CM | POA: Diagnosis present

## 2019-09-18 DIAGNOSIS — H9190 Unspecified hearing loss, unspecified ear: Secondary | ICD-10-CM | POA: Diagnosis present

## 2019-09-18 DIAGNOSIS — Z436 Encounter for attention to other artificial openings of urinary tract: Secondary | ICD-10-CM | POA: Diagnosis not present

## 2019-09-18 DIAGNOSIS — R627 Adult failure to thrive: Secondary | ICD-10-CM | POA: Diagnosis present

## 2019-09-18 DIAGNOSIS — I959 Hypotension, unspecified: Secondary | ICD-10-CM | POA: Diagnosis not present

## 2019-09-18 DIAGNOSIS — J9601 Acute respiratory failure with hypoxia: Secondary | ICD-10-CM | POA: Diagnosis not present

## 2019-09-18 DIAGNOSIS — E876 Hypokalemia: Secondary | ICD-10-CM | POA: Diagnosis present

## 2019-09-18 DIAGNOSIS — Z66 Do not resuscitate: Secondary | ICD-10-CM | POA: Diagnosis present

## 2019-09-18 DIAGNOSIS — H5340 Unspecified visual field defects: Secondary | ICD-10-CM | POA: Diagnosis not present

## 2019-09-18 DIAGNOSIS — Z7189 Other specified counseling: Secondary | ICD-10-CM

## 2019-09-18 DIAGNOSIS — Z20828 Contact with and (suspected) exposure to other viral communicable diseases: Secondary | ICD-10-CM | POA: Diagnosis not present

## 2019-09-18 DIAGNOSIS — I240 Acute coronary thrombosis not resulting in myocardial infarction: Secondary | ICD-10-CM | POA: Diagnosis not present

## 2019-09-18 DIAGNOSIS — J9600 Acute respiratory failure, unspecified whether with hypoxia or hypercapnia: Secondary | ICD-10-CM | POA: Diagnosis not present

## 2019-09-18 DIAGNOSIS — R410 Disorientation, unspecified: Secondary | ICD-10-CM | POA: Diagnosis not present

## 2019-09-18 DIAGNOSIS — Z515 Encounter for palliative care: Secondary | ICD-10-CM | POA: Diagnosis present

## 2019-09-18 DIAGNOSIS — S42309K Unspecified fracture of shaft of humerus, unspecified arm, subsequent encounter for fracture with nonunion: Secondary | ICD-10-CM | POA: Diagnosis not present

## 2019-09-18 DIAGNOSIS — I110 Hypertensive heart disease with heart failure: Secondary | ICD-10-CM | POA: Diagnosis not present

## 2019-09-18 DIAGNOSIS — Z79899 Other long term (current) drug therapy: Secondary | ICD-10-CM

## 2019-09-18 DIAGNOSIS — Z803 Family history of malignant neoplasm of breast: Secondary | ICD-10-CM

## 2019-09-18 DIAGNOSIS — Z794 Long term (current) use of insulin: Secondary | ICD-10-CM

## 2019-09-18 DIAGNOSIS — I69398 Other sequelae of cerebral infarction: Secondary | ICD-10-CM | POA: Diagnosis not present

## 2019-09-18 DIAGNOSIS — E1149 Type 2 diabetes mellitus with other diabetic neurological complication: Secondary | ICD-10-CM | POA: Diagnosis present

## 2019-09-18 DIAGNOSIS — R0600 Dyspnea, unspecified: Secondary | ICD-10-CM | POA: Diagnosis not present

## 2019-09-18 DIAGNOSIS — G9341 Metabolic encephalopathy: Secondary | ICD-10-CM

## 2019-09-18 DIAGNOSIS — Z8249 Family history of ischemic heart disease and other diseases of the circulatory system: Secondary | ICD-10-CM

## 2019-09-18 DIAGNOSIS — R413 Other amnesia: Secondary | ICD-10-CM | POA: Diagnosis present

## 2019-09-18 DIAGNOSIS — Z9981 Dependence on supplemental oxygen: Secondary | ICD-10-CM | POA: Diagnosis not present

## 2019-09-18 DIAGNOSIS — Z7401 Bed confinement status: Secondary | ICD-10-CM | POA: Diagnosis not present

## 2019-09-18 DIAGNOSIS — J80 Acute respiratory distress syndrome: Secondary | ICD-10-CM | POA: Diagnosis not present

## 2019-09-18 DIAGNOSIS — Z89421 Acquired absence of other right toe(s): Secondary | ICD-10-CM | POA: Diagnosis not present

## 2019-09-18 DIAGNOSIS — Z9119 Patient's noncompliance with other medical treatment and regimen: Secondary | ICD-10-CM

## 2019-09-18 DIAGNOSIS — M84452A Pathological fracture, left femur, initial encounter for fracture: Secondary | ICD-10-CM | POA: Diagnosis present

## 2019-09-18 DIAGNOSIS — Z7901 Long term (current) use of anticoagulants: Secondary | ICD-10-CM

## 2019-09-18 DIAGNOSIS — M25522 Pain in left elbow: Secondary | ICD-10-CM | POA: Diagnosis not present

## 2019-09-18 DIAGNOSIS — I4891 Unspecified atrial fibrillation: Secondary | ICD-10-CM | POA: Diagnosis not present

## 2019-09-18 LAB — BASIC METABOLIC PANEL
Anion gap: 10 (ref 5–15)
BUN: 7 mg/dL — ABNORMAL LOW (ref 8–23)
CO2: 33 mmol/L — ABNORMAL HIGH (ref 22–32)
Calcium: 9 mg/dL (ref 8.9–10.3)
Chloride: 99 mmol/L (ref 98–111)
Creatinine, Ser: 1.12 mg/dL (ref 0.61–1.24)
GFR calc Af Amer: >60 mL/min (ref >60)
GFR calc non Af Amer: >60 mL/min (ref >60)
Glucose, Bld: 113 mg/dL — ABNORMAL HIGH (ref 70–99)
Potassium: 3.6 mmol/L (ref 3.5–5.1)
Sodium: 142 mmol/L (ref 135–145)

## 2019-09-18 LAB — CBC WITH DIFFERENTIAL/PLATELET
Abs Immature Granulocytes: 0.05 10*3/uL (ref 0.00–0.07)
Basophils Absolute: 0 10*3/uL (ref 0.0–0.1)
Basophils Relative: 0 %
Eosinophils Absolute: 0 10*3/uL (ref 0.0–0.5)
Eosinophils Relative: 0 %
HCT: 37 % — ABNORMAL LOW (ref 39.0–52.0)
Hemoglobin: 11.1 g/dL — ABNORMAL LOW (ref 13.0–17.0)
Immature Granulocytes: 1 %
Lymphocytes Relative: 7 %
Lymphs Abs: 0.5 10*3/uL — ABNORMAL LOW (ref 0.7–4.0)
MCH: 28.6 pg (ref 26.0–34.0)
MCHC: 30 g/dL (ref 30.0–36.0)
MCV: 95.4 fL (ref 80.0–100.0)
Monocytes Absolute: 0.7 10*3/uL (ref 0.1–1.0)
Monocytes Relative: 10 %
Neutro Abs: 5.8 10*3/uL (ref 1.7–7.7)
Neutrophils Relative %: 82 %
Platelets: 246 10*3/uL (ref 150–400)
RBC: 3.88 MIL/uL — ABNORMAL LOW (ref 4.22–5.81)
RDW: 16.4 % — ABNORMAL HIGH (ref 11.5–15.5)
WBC: 7.1 10*3/uL (ref 4.0–10.5)
nRBC: 0 % (ref 0.0–0.2)

## 2019-09-18 LAB — CBG MONITORING, ED
Glucose-Capillary: 114 mg/dL — ABNORMAL HIGH (ref 70–99)
Glucose-Capillary: 123 mg/dL — ABNORMAL HIGH (ref 70–99)

## 2019-09-18 LAB — PROTIME-INR
INR: 3.9 — ABNORMAL HIGH (ref 0.8–1.2)
Prothrombin Time: 37.9 seconds — ABNORMAL HIGH (ref 11.4–15.2)

## 2019-09-18 LAB — BRAIN NATRIURETIC PEPTIDE: B Natriuretic Peptide: 1374.2 pg/mL — ABNORMAL HIGH (ref 0.0–100.0)

## 2019-09-18 LAB — SARS CORONAVIRUS 2 BY RT PCR (HOSPITAL ORDER, PERFORMED IN ~~LOC~~ HOSPITAL LAB): SARS Coronavirus 2: NEGATIVE

## 2019-09-18 LAB — MAGNESIUM: Magnesium: 1.5 mg/dL — ABNORMAL LOW (ref 1.7–2.4)

## 2019-09-18 MED ORDER — MAGNESIUM OXIDE 400 (241.3 MG) MG PO TABS
400.0000 mg | ORAL_TABLET | Freq: Two times a day (BID) | ORAL | Status: DC
  Administered 2019-09-19: 400 mg via ORAL
  Filled 2019-09-18: qty 1

## 2019-09-18 MED ORDER — SODIUM CHLORIDE 0.9% FLUSH
3.0000 mL | Freq: Two times a day (BID) | INTRAVENOUS | Status: DC
  Administered 2019-09-18 – 2019-09-20 (×3): 3 mL via INTRAVENOUS

## 2019-09-18 MED ORDER — METOPROLOL SUCCINATE ER 25 MG PO TB24
25.0000 mg | ORAL_TABLET | Freq: Every day | ORAL | Status: DC
  Administered 2019-09-19 – 2019-09-20 (×2): 25 mg via ORAL
  Filled 2019-09-18 (×2): qty 1

## 2019-09-18 MED ORDER — FUROSEMIDE 10 MG/ML IJ SOLN
40.0000 mg | Freq: Two times a day (BID) | INTRAMUSCULAR | Status: DC
  Administered 2019-09-19 – 2019-09-20 (×3): 40 mg via INTRAVENOUS
  Filled 2019-09-18 (×3): qty 4

## 2019-09-18 MED ORDER — ONDANSETRON HCL 4 MG/2ML IJ SOLN: 4.0000 mg | Freq: Four times a day (QID) | INTRAMUSCULAR | Status: DC | PRN

## 2019-09-18 MED ORDER — PRAVASTATIN SODIUM 40 MG PO TABS: 40.0000 mg | ORAL_TABLET | Freq: Every day | ORAL | Status: DC

## 2019-09-18 MED ORDER — ACETAMINOPHEN 325 MG PO TABS: 650.0000 mg | ORAL_TABLET | ORAL | Status: DC | PRN

## 2019-09-18 MED ORDER — FUROSEMIDE 10 MG/ML IJ SOLN
40.0000 mg | Freq: Once | INTRAMUSCULAR | Status: AC
  Administered 2019-09-18: 40 mg via INTRAVENOUS
  Filled 2019-09-18: qty 4

## 2019-09-18 MED ORDER — SODIUM CHLORIDE 0.9% FLUSH: 3.0000 mL | INTRAVENOUS | Status: DC | PRN

## 2019-09-18 MED ORDER — FUROSEMIDE 10 MG/ML IJ SOLN
100.0000 mg | Freq: Once | INTRAVENOUS | Status: DC
  Filled 2019-09-18: qty 10

## 2019-09-18 MED ORDER — INSULIN ASPART 100 UNIT/ML ~~LOC~~ SOLN: 0.0000 [IU] | Freq: Every day | SUBCUTANEOUS | Status: DC

## 2019-09-18 MED ORDER — ACETAMINOPHEN 500 MG PO TABS: 1000.0000 mg | ORAL_TABLET | Freq: Every day | ORAL | Status: DC

## 2019-09-18 MED ORDER — FINASTERIDE 5 MG PO TABS
5.0000 mg | ORAL_TABLET | Freq: Every day | ORAL | Status: DC
  Administered 2019-09-19 – 2019-09-20 (×3): 5 mg via ORAL
  Filled 2019-09-18 (×4): qty 1

## 2019-09-18 MED ORDER — IOHEXOL 350 MG/ML SOLN
100.0000 mL | Freq: Once | INTRAVENOUS | Status: AC | PRN
  Administered 2019-09-18: 100 mL via INTRAVENOUS

## 2019-09-18 MED ORDER — NITROGLYCERIN 0.4 MG SL SUBL: 0.4000 mg | SUBLINGUAL_TABLET | SUBLINGUAL | Status: DC | PRN

## 2019-09-18 MED ORDER — WARFARIN - PHARMACIST DOSING INPATIENT: Freq: Every day | Status: DC

## 2019-09-18 MED ORDER — ENSURE ENLIVE PO LIQD
237.0000 mL | Freq: Three times a day (TID) | ORAL | Status: DC
  Administered 2019-09-19 – 2019-09-20 (×4): 237 mL via ORAL
  Filled 2019-09-18 (×2): qty 237

## 2019-09-18 MED ORDER — SODIUM CHLORIDE 0.9 % IV SOLN: 250.0000 mL | INTRAVENOUS | Status: DC | PRN

## 2019-09-18 MED ORDER — INSULIN ASPART 100 UNIT/ML ~~LOC~~ SOLN
0.0000 [IU] | Freq: Three times a day (TID) | SUBCUTANEOUS | Status: DC
  Administered 2019-09-19: 3 [IU] via SUBCUTANEOUS
  Administered 2019-09-20: 2 [IU] via SUBCUTANEOUS

## 2019-09-18 NOTE — ED Notes (Signed)
Radiology at bedside for CXR and Elbow xray

## 2019-09-18 NOTE — ED Provider Notes (Signed)
  Face-to-face evaluation   History: He presents for evaluation of respiratory distress.  He was treated with Haldol at the scene for possible anxiety.  He is unable to give any history.  He is confused.  Physical exam: Elderly, alert and conversant.  He is somewhat paranoid.  Mouth with dry oral mucous membranes.  He has persistent tachypnea and borderline tachycardia.  Medical screening examination/treatment/procedure(s) were conducted as a shared visit with non-physician practitioner(s) and myself.  I personally evaluated the patient during the encounter    Daleen Bo, MD 09/18/19 2017

## 2019-09-18 NOTE — ED Provider Notes (Signed)
[facility] EMERGENCY DEPARTMENT Provider Note   CSN: [id number] Arrival date & time: 09/18/19  1227     History   Chief Complaint Chief Complaint  Patient presents with  . Anxiety    HPI Charles [name] is a 78 y.o. male.     The history is provided by the patient, the nursing home and medical records. No language interpreter was used.  Anxiety     78 year old male with hx of CAD, CHF, CVA, DM, currently DNR brought here via EMS from [facility] for respiratory discomfort.  It is difficult to obtain history from patient as he is hard of hearing and is a poor historian.  Additional history was obtained through patient's nurse at the facility through the phone.  Patient recently discharged for CHF exacerbation and was treated with Lasix.  Earlier today, patient had an episode where he was gasping for air.  O2 sats at that time was around 70 to 80%.  Patient appeared cyanotic according to his nurse.  Patient was given supplemental oxygen including nonrebreather and symptoms did improve.  Patient brought here for further evaluation.  No report of any significant cough but nurse states that patient is usually noncompliant.  There is also mention Haldol was given.  Past Medical History:  Diagnosis Date  . Arthritis   . B12 deficiency   . CAD (coronary artery disease)    a. Approx. 2000 - MI. Cath: single vsl dz, PTCA dLAD/medical rx;  b. NSTEMI 11/13 => IVUS attempted for RCA but not successful; anatomy felt stable from 2000 => med Rx. c. [place] 08/2016: severe stable diffuse dLAD, severe prox RCA -> PCI of RCA unsuccessful; d. 05/2017 NSTEMI/Cath: LM nl, LAD 20ost/p, 23m, 80d, LCX 30ost, 20p, 40m, OM2 90, RCA 80p, 99d.  . Cancer ([place])    skin cancer- head   . Cataracts   . Cholelithiasis   . Chronic diastolic CHF (congestive heart failure) ([place])    a. 05/2017 Echo: EF 60-65%, Gr2 DD, mildly dil Ao, triv MR, mildly to mod dil LA, mild PR.  . Claustrophobia   . COPD  (chronic obstructive pulmonary disease) ([place])   . CVA (cerebral vascular accident) ([place]) ~ 2001   vision inparted from stroke.  Visual Memory loss  . Diabetic neuropathy ([place])   . Diverticulitis   . Dysrhythmia    if he does not take Metoprolol- Afib  . ED (erectile dysfunction)   . Essential hypertension   . History of MRSA infection    Right foot  . History of stomach ulcers   . HOH (hard of hearing)   . Hypercholesterolemia   . Legally blind   . Lower extremity edema   . Morbid obesity ([place])   . Neuropathy   . NSTEMI (non-ST elevated myocardial infarction) ([place]) 05/2017  . OSA on CPAP   . PAF (paroxysmal atrial fibrillation) ([name])    a. confirmed by event monitor. b. 02/2014 rash on Coumadin, patient decided to discontinue Xarelto due to possible rash, cost and lawyers ads on TV, agreed to take Plavix.  . Pneumonia 2016  . Tunnel vision    Since stroke  . Type II diabetes mellitus ([place])   . Venous stasis   . Weakness 01/11/2017    Patient Active Problem List   Diagnosis Date Noted  . Permanent atrial fibrillation ([place])   . CHF (congestive heart failure) ([place])   . Persistent atrial fibrillation ([place])   . Chronic cor pulmonale ([name])   .  PFO (patent foramen ovale)   . Pneumonia due to infectious organism   . Lactic acidosis   . ARF (acute renal failure) ([place]) 08/23/2019  . Acute cystitis with hematuria   . Overdose 08/03/2019  . Urinary incontinence 05/25/2019  . Dysarthria 02/21/2019  . Humerus fracture 08/23/2018  . Chronic gingivitis 08/16/2017  . NSTEMI (non-ST elevated myocardial infarction) ([place]) 06/01/2017  . Scaly skin 05/16/2017  . C. difficile colitis 01/31/2017  . PAT (paroxysmal atrial tachycardia) ([place]) 01/15/2017  . HLD (hyperlipidemia) 01/15/2017  . Hypoglycemia 01/11/2017  . Demand ischemia of myocardium ([place])   . Diarrhea 01/10/2017  . Elevated troponin 01/10/2017  . Dehydration 01/10/2017  . Hypoglycemia due to type 2 diabetes mellitus ([place])  01/10/2017  . Hypoglycemia associated with diabetes ([place]) 10/17/2016  . Chronic ulcer of left foot ([place]) 08/31/2016  . Acute on chronic combined systolic (congestive) and diastolic (congestive) heart failure ([place]) 08/20/2016  . Venous stasis   . Lower extremity edema   . Hypercholesterolemia   . Diabetic neuropathy ([place])   . Unstable angina ([place]) 08/15/2016  . 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 Pressure ulcer 12/27/2015  . Gangrene of toe ([place]) 12/26/2015  . Acute renal failure (ARF) ([place]) 12/26/2015  . Subconjunctival hemorrhage 10/21/2015  . Cellulitis in diabetic foot ([place])   . Cellulitis 05/06/2015  . COPD exacerbation ([place]) 10/10/2014  . Hematuria 08/12/2014  . Hypokalemia 08/12/2014  . Urinary retention 08/03/2014  . Hypotension 07/26/2014  . COPD  07/26/2014  . Shock ([place]) 07/25/2014  . Rash and nonspecific skin eruption 02/13/2014  . PAF (paroxysmal atrial fibrillation) ([place]) 11/13/2013  . Morbid obesity ([place]) 05/24/2012  . Type 2 diabetes, uncontrolled, with retinopathy ([place]) 02/09/2012  . NEOPLASM OF UNCERTAIN BEHAVIOR OF SKIN 12/24/2010  . WEIGHT GAIN, ABNORMAL 02/02/2010  . TOBACCO USE, QUIT 02/02/2010  . 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 HYPOGONADISM 07/12/2007  . B12 deficiency 07/12/2007  . Essential hypertension 07/12/2007  . MYOCARDIAL INFARCTION, HX OF 07/12/2007  . CAD (coronary artery disease) 07/12/2007  . History of CVA (cerebrovascular accident) 07/12/2007  . VENOUS INSUFFICIENCY, LEGS 07/12/2007  . SYMPTOM, MEMORY LOSS 07/12/2007  . DIVERTICULITIS, HX OF 07/12/2007    Past Surgical History:  Procedure Laterality Date  . CARDIAC CATHETERIZATION  1990's  . CARDIAC CATHETERIZATION N/A 08/17/2016   Procedure: Left Heart Cath and Coronary Angiography;  Surgeon: Peter M Martinique, MD;  Location: [facility];  Service: Cardiovascular;  Laterality: N/A;  . CARDIAC CATHETERIZATION N/A 08/19/2016   Procedure: Coronary Balloon  Angioplasty;  Surgeon: Peter M Martinique, MD;  Location: [facility];  Service: Cardiovascular;  Laterality: N/A;  . CATARACT EXTRACTION  10/2016  . CEREBRAL ANGIOGRAM  ~ 2000  . COLONOSCOPY    . LEFT HEART CATH AND CORONARY ANGIOGRAPHY N/A 06/01/2017   Procedure: Left Heart Cath and Coronary Angiography;  Surgeon: Burnell Blanks, MD;  Location: [facility];  Service: Cardiovascular;  Laterality: N/A;  . LEFT HEART CATHETERIZATION WITH CORONARY ANGIOGRAM N/A 10/27/2012   Procedure: LEFT HEART CATHETERIZATION WITH CORONARY ANGIOGRAM;  Surgeon: Burnell Blanks, MD;  Location: [facility] CATH LAB;  Service: Cardiovascular;  Laterality: N/A;  . TOE AMPUTATION  2006; 2009   "Dr. Blenda Mounts; big toe left foot; little toe on right foot" (10/26/2012)  . TOE AMPUTATION Right 12/24/2015   2nd & 3 toes/notes 1/13//2017  . TOE AMPUTATION Right    5TH TOE  . TRANSMETATARSAL AMPUTATION Right 10/13/2016  Procedure: TRANSMETATARSAL AMPUTATION;  Surgeon: Trula Slade, DPM;  Location: [facility];  Service: Podiatry;  Laterality: Right;        Home Medications    Prior to Admission medications   Medication Sig Start Date End Date Taking? Authorizing Provider  acetaminophen (TYLENOL) 500 MG tablet Take 1,000 mg by mouth at bedtime.     [provider]  albuterol (VENTOLIN HFA) 108 (90 Base) MCG/ACT inhaler Inhale 1-2 puffs into the lungs every 6 (six) hours as needed for wheezing or shortness of breath.    [provider]  Cholecalciferol (VITAMIN D-3) 25 MCG (1000 UT) CAPS Take 1,000 Units by mouth daily.    [provider]  collagenase (SANTYL) ointment Apply 1 application topically daily. 11/29/16   Trula Slade, DPM  feeding supplement, ENSURE ENLIVE, (ENSURE ENLIVE) LIQD Take 237 mLs by mouth 3 (three) times daily between meals. 09/06/19   Ghimire, Henreitta Leber, MD  finasteride (PROSCAR) 5 MG tablet Take 1 tablet (5 mg total) by mouth daily. 09/07/19   Ghimire,  Henreitta Leber, MD  Fluticasone-Salmeterol (ADVAIR DISKUS) 100-50 MCG/DOSE AEPB Inhale 1 puff into the lungs 2 (two) times daily. 03/02/16   Plotnikov, Evie Lacks, MD  insulin aspart (NOVOLOG) 100 UNIT/ML injection 0-9 Units, Subcutaneous TID , with meals CBG < 70: Implement Hypoglycemia  Orders CBG 70 - 120: 0 units CBG 121 - 150: 1 unit CBG 151 - 200: 2 units CBG 201 - 250: 3 units CBG 251 - 300: 5 units CBG 301 - 350: 7 units CBG 351 - 400: 9 units CBG > 400: call MD 09/06/19   Jonetta Osgood, MD  magnesium oxide (MAG-OX) 400 (241.3 Mg) MG tablet Take 1 tablet (400 mg total) by mouth 2 (two) times daily. 08/09/19   Mercy Riding, MD  metFORMIN (GLUCOPHAGE) 500 MG tablet TAKE 1 TABLET TWICE DAILY WITH A MEAL Patient taking differently: Take 500 mg by mouth 2 (two) times daily with a meal.  05/08/19   Plotnikov, Evie Lacks, MD  metoprolol succinate (TOPROL-XL) 25 MG 24 hr tablet Take 1 tablet (25 mg total) by mouth daily. 09/07/19   Ghimire, Henreitta Leber, MD  mupirocin cream (BACTROBAN) 2 % Apply topically 2 (two) times daily. 09/07/19   Antonieta Pert, MD  mupirocin ointment (BACTROBAN) 2 % Apply 1 application topically See admin instructions. Apply to affected areas of bilateral feet, ankles, and legs once daily     [provider]  nitroGLYCERIN (NITROSTAT) 0.4 MG SL tablet DISSOLVE 1 TABLET (0.4 MG TOTAL) UNDER THE TONGUE EVERY 5 (FIVE) MINUTES AS NEEDED (UP TO 3 DOSES) FOR CHEST PAIN Patient taking differently: Place 0.4 mg under the tongue every 5 (five) minutes as needed for chest pain.  05/08/19   Plotnikov, Evie Lacks, MD  pravastatin (PRAVACHOL) 40 MG tablet TAKE 1 TABLET EVERY DAY Patient taking differently: Take 40 mg by mouth at bedtime.  05/08/19   Plotnikov, Evie Lacks, MD  PRESCRIPTION MEDICATION See admin instructions. CPAP- At bedtime    [provider]  ranolazine (RANEXA) 500 MG 12 hr tablet TAKE 1 TABLET TWICE DAILY Patient taking differently: Take 500 mg by mouth 2 (two)  times daily.  08/03/18   Plotnikov, Evie Lacks, MD  torsemide (DEMADEX) 20 MG tablet Take 2 tablets (40 mg total) by mouth daily. 09/06/19   Ghimire, Henreitta Leber, MD  triamcinolone ointment (KENALOG) 0.5 % Apply 1 application topically 2 (two) times daily. Patient taking differently: Apply 1 application topically  2 (two) times daily as needed (for itching).  02/21/19 02/21/20  Plotnikov, Evie Lacks, MD  vitamin B-12 (CYANOCOBALAMIN) 1000 MCG tablet Take 1,000 mcg by mouth daily.     [provider]  warfarin (COUMADIN) 7.5 MG tablet Take 1 tablet (7.5 mg total) by mouth daily at 6 PM. 09/06/19   Ghimire, Henreitta Leber, MD    Family History Family History  Problem Relation Age of Onset  . Breast cancer Mother 8  . Heart disease Father 82  . Asthma Neg Hx     Social History Social History   Tobacco Use  . Smoking status: Former Smoker    Packs/day: 1.00    Years: 30.00    Pack years: 30.00    Types: Cigarettes, Cigars  . Smokeless tobacco: Former Systems developer    Quit date: 08/15/2016  . Tobacco comment: 12/2016 quit smoking in 1998  Substance Use Topics  . Alcohol use: Yes    Comment: 12/2016   rare   . Drug use: No     Allergies   Tolterodine, Lipitor [atorvastatin], and Xarelto [rivaroxaban]   Review of Systems Review of Systems  Unable to perform ROS: Mental status change     Physical Exam Updated Vital Signs BP 129/60   Temp 98.1 F (36.7 C) (Oral)   Resp (!) 33   Physical Exam Vitals signs and nursing note reviewed.  Constitutional:      General: He is not in acute distress.    Appearance: He is well-developed. He is obese.     Comments: Elderly obese male in no acute discomfort  HENT:     Head: Atraumatic.     Mouth/Throat:     Mouth: Mucous membranes are dry.  Eyes:     Conjunctiva/sclera: Conjunctivae normal.  Neck:     Musculoskeletal: Normal range of motion and neck supple. No neck rigidity.  Cardiovascular:     Rate and Rhythm: Rhythm irregular.   Pulmonary:     Breath sounds: Rales present.     Comments: Diminished lung sounds at bases Abdominal:     Palpations: Abdomen is soft.     Tenderness: There is no abdominal tenderness.  Musculoskeletal:        General: Tenderness (Left elbow: Tenderness about the elbow on palpation with normal elbow flexion and extension.) present.  Skin:    Capillary Refill: Capillary refill takes less than 2 seconds.     Findings: No rash.     Comments: Chronic lower extremity skin changes consistent with venous stasis.  Minimal edema to bilateral extremities.  Neurological:     Mental Status: He is alert.     Comments: Patient is alert to self but not to time location or situation  Psychiatric:        Mood and Affect: Mood normal.      ED Treatments / Results  Labs (all labs ordered are listed, but only abnormal results are displayed) Labs Reviewed  BASIC METABOLIC PANEL - Abnormal; Notable for the following components:      Result Value   CO2 33 (*)    Glucose, Bld 113 (*)    BUN 7 (*)    All other components within normal limits  CBC WITH DIFFERENTIAL/PLATELET - Abnormal; Notable for the following components:   RBC 3.88 (*)    Hemoglobin 11.1 (*)    HCT 37.0 (*)    RDW 16.4 (*)    Lymphs Abs 0.5 (*)    All other components within normal  limits  BRAIN NATRIURETIC PEPTIDE - Abnormal; Notable for the following components:   B Natriuretic Peptide 1,374.2 (*)    All other components within normal limits  PROTIME-INR - Abnormal; Notable for the following components:   Prothrombin Time 37.9 (*)    INR 3.9 (*)    All other components within normal limits  SARS CORONAVIRUS 2 (HOSPITAL ORDER, [facility])    EKG None  ED ECG REPORT   Date: 09/18/2019  Rate: 100  Rhythm: atrial fibrillation  QRS Axis: normal  Intervals: QT prolonged  ST/T Wave abnormalities: normal  Conduction Disutrbances:nonspecific intraventricular conduction delay  Narrative  Interpretation:   Old EKG Reviewed: unchanged  I have personally reviewed the EKG tracing and agree with the computerized printout as noted.   Radiology Dg Elbow 2 Views Left  Result Date: 09/18/2019 CLINICAL DATA:  78 year old with acute onset of LEFT elbow pain and crepitus that began yesterday. No known injury. EXAM: LEFT ELBOW - 2 VIEW COMPARISON:  No prior elbow imaging. LEFT humerus x-rays 08/27/2019. FINDINGS: Osseous demineralization. No evidence of acute, subacute or healed fractures. Well-preserved joint spaces. No posterior fat pad to indicate a joint effusion. IMPRESSION: No acute or subacute osseous abnormality. Osseous demineralization. Electronically Signed   By: Evangeline Dakin M.D.   On: 09/18/2019 13:48   Dg Chest Port 1 View  Result Date: 09/18/2019 CLINICAL DATA:  78 year old acute onset of shortness of breath and anxiety. Former smoker. EXAM: PORTABLE CHEST 1 VIEW COMPARISON:  08/25/2019 and earlier. FINDINGS: Cardiac silhouette moderately to markedly enlarged, unchanged. Interval increase in size of the large RIGHT pleural effusion since the 08/25/2019 examination. New small LEFT pleural effusion. Consolidation in the lower lobes bilaterally, RIGHT greater than LEFT, and in the RIGHT MIDDLE LOBE. Mild pulmonary venous hypertension without overt edema. IMPRESSION: 1. Interval increase in size of the large RIGHT pleural effusion since the 08/25/2019 examination. 2. New small LEFT pleural effusion. 3. Passive atelectasis is favored over pneumonia in the lower lobes bilaterally, RIGHT greater than LEFT, and in the RIGHT MIDDLE LOBE. Electronically Signed   By: Evangeline Dakin M.D.   On: 09/18/2019 13:46    Procedures Procedures (including critical care time)  Medications Ordered in ED Medications  furosemide (LASIX) injection 40 mg (has no administration in time range)     Initial Impression / Assessment and Plan / ED Course  I have reviewed the triage vital signs and  the nursing notes.  Pertinent labs & imaging results that were available during my care of the patient were reviewed by me and considered in my medical decision making (see chart for details).        BP (!) 104/46   Pulse 95   Temp 98.1 F (36.7 C) (Oral)   Resp (!) 29   SpO2 100%    Final Clinical Impressions(s) / ED Diagnoses   Final diagnoses:  Acute on chronic congestive heart failure, unspecified heart failure type [facility])    ED Discharge Orders    None     1:20 PM Elderly male sent here from nursing facility after he developed episodes of what appears to be anxiety however he was found to be hypoxic with O2 sat in the 70s-80s percent.  He was given nonrebreather and improved.  Does have history of CHF recently admitted to the hospital for medication management and discharged home with Lasix.  He is also DNR.  At this time patient does not appear to be in any  acute respiratory discomfort.  Work-up initiated.  2:34 PM Labs remarkable for BNP of 1374, and CXR showing large right pleural effusion suggestive of CHF exacerbation. Pt is torsemide 40mg  daily.  Will start him on Lasix 40mg .  Chest CTA ordered to r/o PE, however pt is currently on warfarin.  Doubt pneumonia, however CTA will help determine if there's any underlying focal infiltrates. Care discussed with Dr. Eulis Foster.  Pt currently on O2 4L.  4:02 PM Appreciate consultation from [place], Dr. Doristine Bosworth, who agrees to see and admit pt for CHF exacerbation.     Domenic Moras, PA-C 09/18/19 1605    Daleen Bo, MD 09/18/19 2019

## 2019-09-18 NOTE — H&P (Signed)
History and Physical    Charles [name] [id number] DOB: June 18, 1941 DOA: 09/18/2019  PCP: Cassandria Anger, MD  Patient coming from: [place] home  I have personally briefly reviewed patient's old medical records in [place]  Chief Complaint: Shortness of breath/hypoxia  HPI: Charles [name] is a 78 y.o. male with medical history significant of chronic systolic and diastolic congestive heart failure, CAD managed medically, type 2 diabetes mellitus, sleep apnea, COPD, history of CVA, permanent atrial fibrillation on Coumadin and several other comorbidities with recurrent hospitalizations with latest discharge being on 09/06/2019 for acute hypoxic respiratory failure and AKI and acute on chronic systolic and diastolic congestive heart failure was brought into the ED from his nursing home after he was found to have some shortness of breath and hypoxia.  Reportedly, he was doing fine up until around noontime when nursing staff found him in his room having some shortness of breath and his oxygen saturation was 70% on room air.  He was slightly altered and he was brought into the emergency department.  Daughter is sitting at the bedside and providing most of the history as patient seems to have some altered mental status however he looks comfortable.  ED Course: Upon arrival to the ED, his oxygen saturation was 72% on room air.  He was placed on oxygen and currently on 4 L of nasal cannula.  Further work-up included chest x-ray which showed congestive heart failure and bilateral pleural effusion, right more than the left and worsened from recent chest x-ray.BNP was 1374 while his baseline seems to be around 500 area.  He was given dose of Lasix and hospital service was consulted for admission.  CT angiogram has been ordered by EDP which is still pending as well as COVID-19 test is still pending.  Review of Systems: As per HPI otherwise negative.    Past Medical History:  Diagnosis Date    Arthritis    B12 deficiency    CAD (coronary artery disease)    a. Approx. 2000 - MI. Cath: single vsl dz, PTCA dLAD/medical rx;  b. NSTEMI 11/13 => IVUS attempted for RCA but not successful; anatomy felt stable from 2000 => med Rx. c. [place] 08/2016: severe stable diffuse dLAD, severe prox RCA -> PCI of RCA unsuccessful; d. 05/2017 NSTEMI/Cath: LM nl, LAD 20ost/p, 12m, 80d, LCX 30ost, 20p, 82m, OM2 90, RCA 80p, 99d.   Cancer [facility])    skin cancer- head    Cataracts    Cholelithiasis    Chronic diastolic CHF (congestive heart failure) ([place])    a. 05/2017 Echo: EF 60-65%, Gr2 DD, mildly dil Ao, triv MR, mildly to mod dil LA, mild PR.   Claustrophobia    COPD (chronic obstructive pulmonary disease) ([name])    CVA (cerebral vascular accident) ([place]) ~ 2001   vision inparted from stroke.  Visual Memory loss   Diabetic neuropathy ([place])    Diverticulitis    Dysrhythmia    if he does not take Metoprolol- Afib   ED (erectile dysfunction)    Essential hypertension    History of MRSA infection    Right foot   History of stomach ulcers    HOH (hard of hearing)    Hypercholesterolemia    Legally blind    Lower extremity edema    Morbid obesity ([name])    Neuropathy    NSTEMI (non-ST elevated myocardial infarction) ([place]) 05/2017   OSA on CPAP    PAF (paroxysmal atrial fibrillation) ([place])  a. confirmed by event monitor. b. 02/2014 rash on Coumadin, patient decided to discontinue Xarelto due to possible rash, cost and lawyers ads on TV, agreed to take Plavix.   Pneumonia 2016   Tunnel vision    Since stroke   Type II diabetes mellitus ([place])    Venous stasis    Weakness 01/11/2017    Past Surgical History:  Procedure Laterality Date   CARDIAC CATHETERIZATION  1990's   CARDIAC CATHETERIZATION N/A 08/17/2016   Procedure: Left Heart Cath and Coronary Angiography;  Surgeon: Peter M Martinique, MD;  Location: [facility];  Service: Cardiovascular;  Laterality: N/A;     CARDIAC CATHETERIZATION N/A 08/19/2016   Procedure: Coronary Balloon Angioplasty;  Surgeon: Peter M Martinique, MD;  Location: [facility];  Service: Cardiovascular;  Laterality: N/A;   CATARACT EXTRACTION  10/2016   CEREBRAL ANGIOGRAM  ~ 2000   COLONOSCOPY     LEFT HEART CATH AND CORONARY ANGIOGRAPHY N/A 06/01/2017   Procedure: Left Heart Cath and Coronary Angiography;  Surgeon: Burnell Blanks, MD;  Location: [facility];  Service: Cardiovascular;  Laterality: N/A;   LEFT HEART CATHETERIZATION WITH CORONARY ANGIOGRAM N/A 10/27/2012   Procedure: LEFT HEART CATHETERIZATION WITH CORONARY ANGIOGRAM;  Surgeon: Burnell Blanks, MD;  Location: [facility] CATH LAB;  Service: Cardiovascular;  Laterality: N/A;   TOE AMPUTATION  2006; 2009   "Dr. 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 His smoking use included cigarettes and cigars. He has a 30.00 pack-year smoking history. He quit smokeless tobacco use about 3 years ago. He reports current alcohol use. He reports that he does not use drugs.  Allergies  Allergen Reactions   Tolterodine Swelling and Other (See Comments)    (Detrol) Dry mouth/dry throat, severe swelling of the legs, and started a "downward spiral"   Lipitor [Atorvastatin] Other (See Comments)    Muscle aches   Xarelto [Rivaroxaban] Rash    Family History  Problem Relation Age of Onset   Breast cancer Mother 84   Heart disease Father 63   Asthma Neg Hx     Prior to Admission medications   Medication Sig Start Date End Date Taking? Authorizing Provider  acetaminophen (TYLENOL) 500 MG tablet Take 1,000 mg by  mouth at bedtime.     [provider]  albuterol (VENTOLIN HFA) 108 (90 Base) MCG/ACT inhaler Inhale 1-2 puffs into the lungs every 6 (six) hours as needed for wheezing or shortness of breath.    [provider]  Cholecalciferol (VITAMIN D-3) 25 MCG (1000 UT) CAPS Take 1,000 Units by mouth daily.    [provider]  collagenase (SANTYL) ointment Apply 1 application topically daily. 11/29/16   Trula Slade, DPM  feeding supplement, ENSURE ENLIVE, (ENSURE ENLIVE) LIQD Take 237 mLs by mouth 3 (three) times daily between meals. 09/06/19   Ghimire, Henreitta Leber, MD  finasteride (PROSCAR) 5 MG tablet Take 1 tablet (5 mg total) by mouth daily. 09/07/19   Ghimire, Henreitta Leber, MD  Fluticasone-Salmeterol (ADVAIR DISKUS) 100-50 MCG/DOSE AEPB Inhale 1 puff into the lungs 2 (two) times daily. 03/02/16  Plotnikov, Evie Lacks, MD  insulin aspart (NOVOLOG) 100 UNIT/ML injection 0-9 Units, Subcutaneous TID , with meals CBG < 70: Implement Hypoglycemia  Orders CBG 70 - 120: 0 units CBG 121 - 150: 1 unit CBG 151 - 200: 2 units CBG 201 - 250: 3 units CBG 251 - 300: 5 units CBG 301 - 350: 7 units CBG 351 - 400: 9 units CBG > 400: call MD 09/06/19   Jonetta Osgood, MD  magnesium oxide (MAG-OX) 400 (241.3 Mg) MG tablet Take 1 tablet (400 mg total) by mouth 2 (two) times daily. 08/09/19   Mercy Riding, MD  metFORMIN (GLUCOPHAGE) 500 MG tablet TAKE 1 TABLET TWICE DAILY WITH A MEAL Patient taking differently: Take 500 mg by mouth 2 (two) times daily with a meal.  05/08/19   Plotnikov, Evie Lacks, MD  metoprolol succinate (TOPROL-XL) 25 MG 24 hr tablet Take 1 tablet (25 mg total) by mouth daily. 09/07/19   Ghimire, Henreitta Leber, MD  mupirocin cream (BACTROBAN) 2 % Apply topically 2 (two) times daily. 09/07/19   Antonieta Pert, MD  mupirocin ointment (BACTROBAN) 2 % Apply 1 application topically See admin instructions. Apply to affected areas of bilateral feet, ankles, and legs once daily     [provider]  nitroGLYCERIN (NITROSTAT) 0.4 MG SL tablet DISSOLVE 1 TABLET (0.4 MG TOTAL) UNDER THE TONGUE EVERY 5 (FIVE) MINUTES AS NEEDED (UP TO 3 DOSES) FOR CHEST PAIN Patient taking differently: Place 0.4 mg under the tongue every 5 (five) minutes as needed for chest pain.  05/08/19   Plotnikov, Evie Lacks, MD  pravastatin (PRAVACHOL) 40 MG tablet TAKE 1 TABLET EVERY DAY Patient taking differently: Take 40 mg by mouth at bedtime.  05/08/19   Plotnikov, Evie Lacks, MD  PRESCRIPTION MEDICATION See admin instructions. CPAP- At bedtime    [provider]  ranolazine (RANEXA) 500 MG 12 hr tablet TAKE 1 TABLET TWICE DAILY Patient taking differently: Take 500 mg by mouth 2 (two) times daily.  08/03/18   Plotnikov, Evie Lacks, MD  torsemide (DEMADEX) 20 MG tablet Take 2 tablets (40 mg total) by mouth daily. 09/06/19   Ghimire, Henreitta Leber, MD  triamcinolone ointment (KENALOG) 0.5 % Apply 1 application topically 2 (two) times daily. Patient taking differently: Apply 1 application topically 2 (two) times daily as needed (for itching).  02/21/19 02/21/20  Plotnikov, Evie Lacks, MD  vitamin B-12 (CYANOCOBALAMIN) 1000 MCG tablet Take 1,000 mcg by mouth daily.     [provider]  warfarin (COUMADIN) 7.5 MG tablet Take 1 tablet (7.5 mg total) by mouth daily at 6 PM. 09/06/19   Jonetta Osgood, MD    Physical Exam: Vitals:   09/18/19 1400 09/18/19 1415 09/18/19 1430 09/18/19 1445  BP: 122/69 135/73 122/73 (!) 104/46  Pulse:   95   Resp: (!) 24 (!) 25 (!) 24 (!) 29  Temp:      TempSrc:      SpO2:   100%     Constitutional: NAD, calm, comfortable Vitals:   09/18/19 1400 09/18/19 1415 09/18/19 1430 09/18/19 1445  BP: 122/69 135/73 122/73 (!) 104/46  Pulse:   95   Resp: (!) 24 (!) 25 (!) 24 (!) 29  Temp:      TempSrc:      SpO2:   100%    Eyes: PERRL, lids and conjunctivae normal, obese ENMT: Mucous membranes are moist. Posterior pharynx clear of any exudate or lesions.Normal  dentition.  Neck: normal, supple,  no masses, no thyromegaly Respiratory: Diminished breath sounds at the bases with crackles in the middle lobes bilaterally. Normal respiratory effort. No accessory muscle use.  Cardiovascular: Irregularly irregular rate and rhythm, no murmurs / rubs / gallops.  +1 pitting edema bilateral lower extremity. 2+ pedal pulses. No carotid bruits.  Abdomen: no tenderness, no masses palpated. No hepatosplenomegaly. Bowel sounds positive.  Musculoskeletal: no clubbing / cyanosis. No joint deformity upper and lower extremities. Good ROM, no contractures. Normal muscle tone.  Skin: no rashes, lesions, ulcers. No induration. Venous stasis changes in bilateral lower extremity Neurologic: CN 2-12 grossly intact. Sensation intact, DTR normal. Strength 5/5 in all 4.  Psychiatric: Poor judgment and insight. Alert and oriented x 1. Normal mood.    Labs on Admission: I have personally reviewed following labs and imaging studies  CBC: Recent Labs  Lab 09/18/19 1306  WBC 7.1  NEUTROABS 5.8  HGB 11.1*  HCT 37.0*  MCV 95.4  PLT 0000000   Basic Metabolic Panel: Recent Labs  Lab 09/18/19 1306  NA 142  K 3.6  CL 99  CO2 33*  GLUCOSE 113*  BUN 7*  CREATININE 1.12  CALCIUM 9.0   GFR: CrCl cannot be calculated (Unknown ideal weight.). Liver Function Tests: No results for input(s): AST, ALT, ALKPHOS, BILITOT, PROT, ALBUMIN in the last 168 hours. No results for input(s): LIPASE, AMYLASE in the last 168 hours. No results for input(s): AMMONIA in the last 168 hours. Coagulation Profile: Recent Labs  Lab 09/18/19 1306  INR 3.9*   Cardiac Enzymes: No results for input(s): CKTOTAL, CKMB, CKMBINDEX, TROPONINI in the last 168 hours. BNP (last 3 results) No results for input(s): PROBNP in the last 8760 hours. HbA1C: No results for input(s): HGBA1C in the last 72 hours. CBG: No results for input(s): GLUCAP in the last 168 hours. Lipid Profile: No results for input(s):  CHOL, HDL, LDLCALC, TRIG, CHOLHDL, LDLDIRECT in the last 72 hours. Thyroid Function Tests: No results for input(s): TSH, T4TOTAL, FREET4, T3FREE, THYROIDAB in the last 72 hours. Anemia Panel: No results for input(s): VITAMINB12, FOLATE, FERRITIN, TIBC, IRON, RETICCTPCT in the last 72 hours. Urine analysis:    Component Value Date/Time   COLORURINE AMBER (A) 08/23/2019 2231   APPEARANCEUR HAZY (A) 08/23/2019 2231   LABSPEC 1.017 08/23/2019 2231   PHURINE 5.0 08/23/2019 2231   GLUCOSEU NEGATIVE 08/23/2019 2231   GLUCOSEU NEGATIVE 05/25/2019 1353   HGBUR MODERATE (A) 08/23/2019 2231   BILIRUBINUR NEGATIVE 08/23/2019 2231   KETONESUR NEGATIVE 08/23/2019 2231   PROTEINUR 100 (A) 08/23/2019 2231   UROBILINOGEN 1.0 05/25/2019 1353   NITRITE NEGATIVE 08/23/2019 2231   LEUKOCYTESUR SMALL (A) 08/23/2019 2231    Radiological Exams on Admission: Dg Elbow 2 Views Left  Result Date: 09/18/2019 CLINICAL DATA:  78 year old with acute onset of LEFT elbow pain and crepitus that began yesterday. No known injury. EXAM: LEFT ELBOW - 2 VIEW COMPARISON:  No prior elbow imaging. LEFT humerus x-rays 08/27/2019. FINDINGS: Osseous demineralization. No evidence of acute, subacute or healed fractures. Well-preserved joint spaces. No posterior fat pad to indicate a joint effusion. IMPRESSION: No acute or subacute osseous abnormality. Osseous demineralization. Electronically Signed   By: Evangeline Dakin M.D.   On: 09/18/2019 13:48   Dg Chest Port 1 View  Result Date: 09/18/2019 CLINICAL DATA:  78 year old acute onset of shortness of breath and anxiety. Former smoker. EXAM: PORTABLE CHEST 1 VIEW COMPARISON:  08/25/2019 and earlier. FINDINGS: Cardiac silhouette moderately to markedly enlarged, unchanged. Interval increase in  size of the large RIGHT pleural effusion since the 08/25/2019 examination. New small LEFT pleural effusion. Consolidation in the lower lobes bilaterally, RIGHT greater than LEFT, and in the RIGHT  MIDDLE LOBE. Mild pulmonary venous hypertension without overt edema. IMPRESSION: 1. Interval increase in size of the large RIGHT pleural effusion since the 08/25/2019 examination. 2. New small LEFT pleural effusion. 3. Passive atelectasis is favored over pneumonia in the lower lobes bilaterally, RIGHT greater than LEFT, and in the RIGHT MIDDLE LOBE. Electronically Signed   By: Evangeline Dakin M.D.   On: 09/18/2019 13:46    EKG: Independently reviewed.  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  Patient's respiratory failure currently is a combination congestive heart failure as well as bilateral pleural effusion which is also a result of congestive heart failure.  He has received a large dose of Lasix in the emergency department.  His home medications include torsemide 40 mg p.o. daily however according to the daughter, patient is very stubborn and does not like to take medications so it is not known whether he has been given his medications at his nursing home.  I will place him on IV Lasix 40 mg twice daily.  Strict I's and O's, sodium restricted diet with 1200 mL fluid restriction.  Continue current oxygen supply and wean as appropriate.  CT chest angiogram as well as COVID-19 pending.  Type 2 diabetes mellitus: On metformin at nursing home.  Hold diet.  Last hemoglobin A1c 7.23 months ago.  Place on SSI.  Permanent atrial fibrillation: Rate  controlled.  Resume home medications.  INR slightly supratherapeutic at 3.9.  Consult pharmacy for Coumadin dose management.  History of LV thrombus: On Coumadin.  INR supratherapeutic.  CAD: Asymptomatic.  Watch closely.  Acute metabolic encephalopathy: Likely due to hypoxia.  Treat underlying cause.  History of COPD: Does not seem to have acute exacerbation.  DVT prophylaxis: Coumadin Code Status: DNR, confirmed by daughter Family Communication: Discussed with daughter who is at the bedside.  She had no further questions. Disposition Plan: Likely back to nursing home in 2 days Consults called: None Admission status: Inpatient   Darliss Cheney MD [facility]  09/18/2019, 4:31 PM  To contact the attending provider between 7A-7P or the covering provider during after hours 7P-7A, please log into the web site [email or web address] and use password [email or web address].

## 2019-09-18 NOTE — ED Triage Notes (Signed)
[facility] reports pt is a resident at [facility] and a DNR. Called for resp distress, but was found to be anxious. Pt had been given 2mg  Haldol. Reported to be baseline at A&o x3. Pt noted to have high bp and to have ruptured a blood vessel in the left eye, before getting medicated for behavioral issue. Pt has CHF, DM.

## 2019-09-18 NOTE — ED Triage Notes (Signed)
Pt disclosed having pain on URE, possible break

## 2019-09-18 NOTE — Progress Notes (Signed)
ANTICOAGULATION CONSULT NOTE - Initial Consult  Pharmacy Consult for Warfarin Indication: atrial fibrillation  Allergies  Allergen Reactions  . Tolterodine Swelling and Other (See Comments)    (Detrol) Dry mouth/dry throat, severe swelling of the legs, and started a "downward spiral"  . Lipitor [Atorvastatin] Other (See Comments)    Muscle aches  . Xarelto [Rivaroxaban] Rash     Vital Signs: Temp: 98.1 F (36.7 C) (10/06 1241) Temp Source: Oral (10/06 1241) BP: 104/46 (10/06 1445) Pulse Rate: 95 (10/06 1430)  Labs: Recent Labs    09/18/19 1306  HGB 11.1*  HCT 37.0*  PLT 246  LABPROT 37.9*  INR 3.9*  CREATININE 1.12    CrCl cannot be calculated (Unknown ideal weight.).   Medical History: Past Medical History:  Diagnosis Date  . Arthritis   . B12 deficiency   . CAD (coronary artery disease)    a. Approx. 2000 - MI. Cath: single vsl dz, PTCA dLAD/medical rx;  b. NSTEMI 11/13 => IVUS attempted for RCA but not successful; anatomy felt stable from 2000 => med Rx. c. [place] 08/2016: severe stable diffuse dLAD, severe prox RCA -> PCI of RCA unsuccessful; d. 05/2017 NSTEMI/Cath: LM nl, LAD 20ost/p, 31m, 80d, LCX 30ost, 20p, 102m, OM2 90, RCA 80p, 99d.  . Cancer ([place])    skin cancer- head   . Cataracts   . Cholelithiasis   . Chronic diastolic CHF (congestive heart failure) ([place])    a. 05/2017 Echo: EF 60-65%, Gr2 DD, mildly dil Ao, triv MR, mildly to mod dil LA, mild PR.  . Claustrophobia   . COPD (chronic obstructive pulmonary disease) ([place])   . CVA (cerebral vascular accident) ([place]) ~ 2001   vision inparted from stroke.  Visual Memory loss  . Diabetic neuropathy ([place])   . Diverticulitis   . Dysrhythmia    if he does not take Metoprolol- Afib  . ED (erectile dysfunction)   . Essential hypertension   . History of MRSA infection    Right foot  . History of stomach ulcers   . HOH (hard of hearing)   . Hypercholesterolemia   . Legally blind   . Lower extremity edema   .  Morbid obesity ([place])   . Neuropathy   . NSTEMI (non-ST elevated myocardial infarction) ([place]) 05/2017  . OSA on CPAP   . PAF (paroxysmal atrial fibrillation) ([name])    a. confirmed by event monitor. b. 02/2014 rash on Coumadin, patient decided to discontinue Xarelto due to possible rash, cost and lawyers ads on TV, agreed to take Plavix.  . Pneumonia 2016  . Tunnel vision    Since stroke  . Type II diabetes mellitus ([place])   . Venous stasis   . Weakness 01/11/2017    Assessment: Charles [name] is a 78 y.o. male with medical history significant of chronic systolic and diastolic congestive heart failure, CAD managed medically, type 2 diabetes mellitus, sleep apnea, COPD, history of CVA, permanent atrial fibrillation on Coumadin and several other comorbidities with recurrent hospitalizations with latest discharge being on 09/06/2019 for acute hypoxic respiratory failure and AKI and acute on chronic systolic and diastolic congestive heart failure was brought into the ED from his nursing home after he was found to have some shortness of breath and hypoxia.  Pharmacy has been consulted to manage his warfarin dosing as an inpatient.  His INR on admission is 3.9.  Goal of Therapy:  INR 2-3 Monitor platelets by anticoagulation protocol: Yes   Plan:  Will  not redose warfarin today Monitor INR daily (will plan to redose when INR < 3)  Alanda Slim, PharmD, [facility] Clinical Pharmacist Please see AMION for all Pharmacists' Contact Phone Numbers 09/18/2019, 5:02 PM

## 2019-09-19 LAB — BASIC METABOLIC PANEL
Anion gap: 14 (ref 5–15)
BUN: 7 mg/dL — ABNORMAL LOW (ref 8–23)
CO2: 31 mmol/L (ref 22–32)
Calcium: 8.6 mg/dL — ABNORMAL LOW (ref 8.9–10.3)
Chloride: 97 mmol/L — ABNORMAL LOW (ref 98–111)
Creatinine, Ser: 0.98 mg/dL (ref 0.61–1.24)
GFR calc Af Amer: >60 mL/min (ref >60)
GFR calc non Af Amer: >60 mL/min (ref >60)
Glucose, Bld: 156 mg/dL — ABNORMAL HIGH (ref 70–99)
Potassium: 3.1 mmol/L — ABNORMAL LOW (ref 3.5–5.1)
Sodium: 142 mmol/L (ref 135–145)

## 2019-09-19 LAB — GLUCOSE, CAPILLARY
Glucose-Capillary: 107 mg/dL — ABNORMAL HIGH (ref 70–99)
Glucose-Capillary: 124 mg/dL — ABNORMAL HIGH (ref 70–99)

## 2019-09-19 LAB — PROTIME-INR
INR: 3.7 — ABNORMAL HIGH (ref 0.8–1.2)
Prothrombin Time: 35.7 seconds — ABNORMAL HIGH (ref 11.4–15.2)

## 2019-09-19 LAB — CBG MONITORING, ED
Glucose-Capillary: 158 mg/dL — ABNORMAL HIGH (ref 70–99)
Glucose-Capillary: 129 mg/dL — ABNORMAL HIGH (ref 70–99)

## 2019-09-19 MED ORDER — MAGNESIUM SULFATE 2 GM/50ML IV SOLN
2.0000 g | Freq: Once | INTRAVENOUS | Status: AC
  Administered 2019-09-19: 2 g via INTRAVENOUS
  Filled 2019-09-19: qty 50

## 2019-09-19 MED ORDER — LORAZEPAM 2 MG/ML IJ SOLN
1.0000 mg | Freq: Once | INTRAMUSCULAR | Status: AC
  Administered 2019-09-19: 06:00:00 1 mg via INTRAVENOUS

## 2019-09-19 MED ORDER — LORAZEPAM 2 MG/ML IJ SOLN
INTRAMUSCULAR | Status: AC
  Administered 2019-09-19: 1 mg via INTRAVENOUS
  Filled 2019-09-19: qty 1

## 2019-09-19 MED ORDER — MORPHINE SULFATE (PF) 2 MG/ML IV SOLN: 1.0000 mg | INTRAVENOUS | Status: DC | PRN

## 2019-09-19 MED ORDER — POTASSIUM CHLORIDE CRYS ER 20 MEQ PO TBCR
40.0000 meq | EXTENDED_RELEASE_TABLET | Freq: Once | ORAL | Status: AC
  Administered 2019-09-19: 10:00:00 40 meq via ORAL
  Filled 2019-09-19: qty 2

## 2019-09-19 NOTE — Consult Note (Signed)
   [facility] CM Inpatient Consult   09/19/2019  Charles [name] 07-22-1941 [id number]   Readmission less than 30 days, admitted from [facility], Medicare ACO  Chart reviewed for unplanned admission with an extreme high risk score of 30%. Chart reveals in the history and physical which includes but no limited to:  Charles [name] is a 78 y.o. male with medical history significant of chronic systolic and diastolic congestive heart failure, CAD managed medically, type 2 diabetes mellitus, sleep apnea, COPD, history of CVA, permanent atrial fibrillation on Coumadin and several other comorbidities with recurrent hospitalizations with latest discharge being on 09/06/2019 for acute hypoxic respiratory failure and AKI and acute on chronic systolic and diastolic congestive heart failure was brought into the ED from his nursing home after he was found to have some shortness of breath and hypoxia.  Reportedly, he was doing fine up until around noontime when nursing staff found him in his room having some shortness of breath and his oxygen saturation was 70% on room air.  He was slightly altered and he was brought into the emergency department.   Patient just arriving on the unit.  Will follow for progress and disposition.  Natividad Brood, RN BSN [facility] Liaison  [phone] business mobile phone Toll free office [phone]  Fax number: [phone] [email or web address]

## 2019-09-19 NOTE — Progress Notes (Signed)
ANTICOAGULATION CONSULT NOTE - Follow Up Consult  Pharmacy Consult for Coumadin Indication: atrial fibrillation and LV thrombus  Allergies  Allergen Reactions  . Tolterodine Swelling and Other (See Comments)    (Detrol) Dry mouth/dry throat, severe swelling of the legs, and started a "downward spiral"  . Lipitor [Atorvastatin] Other (See Comments)    Muscle aches  . Xarelto [Rivaroxaban] Rash    Patient Measurements: Height: 76 inches Weight:  289 lbs on 08/31/19   Vital Signs: BP: 134/85 (10/07 1209) Pulse Rate: 96 (10/07 1209)  Labs: Recent Labs    09/18/19 1306 09/19/19 0459  HGB 11.1*  --   HCT 37.0*  --   PLT 246  --   LABPROT 37.9* 35.7*  INR 3.9* 3.7*  CREATININE 1.12 0.98   Assessment:   78 yr old male continues on Coumadin as prior to admission for atrial fibriallation. Also with LV thrombus and aneursym.  Changed from Eliquis during 9/10-9/25 admission.    INR 3.9 on admit 10/6 and 3.7 today. Supratherapeutic.    PTA Coumadin regimen: 7.5 mg daily  Goal of Therapy:  INR 2-3 Monitor platelets by anticoagulation protocol: Yes   Plan:   Hold Coumadin again today.  Daily PT/INR.  Arty Baumgartner, [place] Pager: [phone] or phone: [phone] 09/19/2019,4:13 PM

## 2019-09-19 NOTE — ED Notes (Signed)
Checked patient blood sugar it was 158 notified RN Blanch Media of blood sugar patient is resting with call bell in reach

## 2019-09-19 NOTE — ED Notes (Signed)
Assisted patient with feeding.

## 2019-09-19 NOTE — ED Notes (Signed)
Pt appears to be very anxious. Yelling, "please, please help me. I need to get up." becoming more tachypneic and SOB. Shaking legs. Appears to be disoriented x4 right now. Stating, "You told me I have a fear." mumbling on about random things then yelling please again. RN explaining he cannot get up d/t SOB. Dr. Curly Rim. Waiting for call back.

## 2019-09-19 NOTE — ED Notes (Signed)
Wife francis [phone] like pt updates.

## 2019-09-19 NOTE — ED Notes (Signed)
Help get patient cleaned up patient is now resting with call bell in reach

## 2019-09-19 NOTE — ED Notes (Signed)
Pt dyspneic at rest. PT O2 sat 91-94 on RA. RN placed pt on 1L [place]. Pt sat >95% on 1L.

## 2019-09-19 NOTE — ED Notes (Signed)
Regular hospital bed ordered 

## 2019-09-19 NOTE — Progress Notes (Signed)
PROGRESS NOTE    SARIM PROPER  [id number] DOB: December 17, 1940 DOA: 09/18/2019 PCP: Cassandria Anger, MD     Brief Narrative:  Charles [name] is a 78 y.o. male with medical history significant of chronic systolic and diastolic congestive heart failure, CAD managed medically, type 2 diabetes mellitus, sleep apnea, COPD, history of CVA, permanent atrial fibrillation on Coumadin and several other comorbidities with recurrent hospitalizations with latest discharge being on 09/06/2019 for acute hypoxic respiratory failure (required BiPAP) and AKI (required CRRT) and acute on chronic systolic and diastolic congestive heart failure was brought into the ED from his nursing home after he was found to have some shortness of breath and hypoxia.  Reportedly, he was doing fine up until around noontime when nursing staff found him in his room having some shortness of breath and his oxygen saturation was 70% on room air.  He was slightly altered and he was brought into the emergency department.   Upon arrival to the ED, his oxygen saturation was 72% on room air.  He was placed on oxygen and currently on 4 L of nasal cannula.  Further work-up included chest x-ray which showed congestive heart failure and bilateral pleural effusion, right more than the left and worsened from recent chest x-ray. BNP was 1374 while his baseline seems to be around 500 area.  He was given dose of Lasix and hospital service was consulted for admission. COVID-19 test negative.   New events last 24 hours / Subjective: Patient remains confused during my exam. Very poor historian. In discussion with wife, patient has been refusing medication in rehab, spitting out his medications, has been declining slowly.   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[place])   Essential hypertension   Acute on chronic combined systolic (congestive) and diastolic (congestive) heart failure ([name])   Permanent atrial  fibrillation ([name])   Acute respiratory failure with hypoxia ([name])   Acute metabolic encephalopathy   Acute hypoxemic respiratory failure -Continue nasal cannula O2 to maintain sat greater than 92%  Acute on chronic systolic and diastolic heart failure -Presented with BNP 1374.2 with CTA chest revealed large right-sided pleural effusion, small left-sided pleural effusion -Continue IV Lasix -Strict I's and O's   LV thrombus -Continue coumadin   Permanent atrial fibrillation -Transitioned to Coumadin previous hospitalization -Continue metoprolol  Dementia with behavioral disturbance -Previous hospitalization, he had waxing and waning delirium with suspected underlying dementia  Diabetes mellitus type 2 -Sliding-scale insulin  Chronic fracture of the left femoral head -Orthopedic surgery was consulted previous admission, recommended for supportive care at that time  Hypokalemia -Replace, trend   Hypomagnesemia -Replace, trend   HLD -Continue pravachol    DVT prophylaxis: Coumadin Code Status: DNR Family Communication: Spoke with wife over the phone, recommended family meeting with palliative care to discuss goals of care during hospitalization, which she was in agreement with  Disposition Plan: Pending clinical improvement    Consultants:   Palliative care  Procedures:   None   Antimicrobials:  Anti-infectives (From admission, onward)   None        Objective: Vitals:   09/19/19 0800 09/19/19 0935 09/19/19 1100 09/19/19 1209  BP: 137/62 119/89 135/87 134/85  Pulse: 79 (!) 111  96  Resp: 19   18  Temp:      TempSrc:      SpO2: 100%   100%   No intake or output data in the 24 hours ending  09/19/19 1444 There were no vitals filed for this visit.  Examination:  General exam: Appears confused Respiratory system: Tachypneic, increased respiratory effort Cardiovascular system: S1 & S2 heard. No murmurs. No pedal edema. Gastrointestinal system:  Abdomen is nondistended, soft and nontender. Normal bowel sounds heard. Central nervous system: Alert but not oriented, confused and poor historian Extremities: Symmetric in appearance  Skin: Chronic venous stasis changes of bilateral shins Psychiatry: Confused  Data Reviewed: I have personally reviewed following labs and imaging studies  CBC: Recent Labs  Lab 09/18/19 1306  WBC 7.1  NEUTROABS 5.8  HGB 11.1*  HCT 37.0*  MCV 95.4  PLT 0000000   Basic Metabolic Panel: Recent Labs  Lab 09/18/19 1306 09/18/19 1727 09/19/19 0459  NA 142  --  142  K 3.6  --  3.1*  CL 99  --  97*  CO2 33*  --  31  GLUCOSE 113*  --  156*  BUN 7*  --  7*  CREATININE 1.12  --  0.98  CALCIUM 9.0  --  8.6*  MG  --  1.5*  --    GFR: CrCl cannot be calculated (Unknown ideal weight.). Liver Function Tests: No results for input(s): AST, ALT, ALKPHOS, BILITOT, PROT, ALBUMIN in the last 168 hours. No results for input(s): LIPASE, AMYLASE in the last 168 hours. No results for input(s): AMMONIA in the last 168 hours. Coagulation Profile: Recent Labs  Lab 09/18/19 1306 09/19/19 0459  INR 3.9* 3.7*   Cardiac Enzymes: No results for input(s): CKTOTAL, CKMB, CKMBINDEX, TROPONINI in the last 168 hours. BNP (last 3 results) No results for input(s): PROBNP in the last 8760 hours. HbA1C: No results for input(s): HGBA1C in the last 72 hours. CBG: Recent Labs  Lab 09/18/19 1720 09/18/19 2158 09/19/19 0820 09/19/19 1144  GLUCAP 114* 123* 158* 129*   Lipid Profile: No results for input(s): CHOL, HDL, LDLCALC, TRIG, CHOLHDL, LDLDIRECT in the last 72 hours. Thyroid Function Tests: No results for input(s): TSH, T4TOTAL, FREET4, T3FREE, THYROIDAB in the last 72 hours. Anemia Panel: No results for input(s): VITAMINB12, FOLATE, FERRITIN, TIBC, IRON, RETICCTPCT in the last 72 hours. Sepsis Labs: No results for input(s): PROCALCITON, LATICACIDVEN in the last 168 hours.  Recent Results (from the past 240  hour(s))  SARS Coronavirus 2 [facility] order, Performed in [facility] hospital lab) Nasopharyngeal Nasopharyngeal Swab     Status: None   Collection Time: 09/18/19  4:12 PM   Specimen: Nasopharyngeal Swab  Result Value Ref Range Status   SARS Coronavirus 2 NEGATIVE NEGATIVE Final    Comment: (NOTE) If result is NEGATIVE SARS-CoV-2 target nucleic acids are NOT DETECTED. The SARS-CoV-2 RNA is generally detectable in upper and lower  respiratory specimens during the acute phase of infection. The lowest  concentration of SARS-CoV-2 viral copies this assay can detect is 250  copies / mL. A negative result does not preclude SARS-CoV-2 infection  and should not be used as the sole basis for treatment or other  patient management decisions.  A negative result may occur with  improper specimen collection / handling, submission of specimen other  than nasopharyngeal swab, presence of viral mutation(s) within the  areas targeted by this assay, and inadequate number of viral copies  (<250 copies / mL). A negative result must be combined with clinical  observations, patient history, and epidemiological information. If result is POSITIVE SARS-CoV-2 target nucleic acids are DETECTED. The SARS-CoV-2 RNA is generally detectable in upper and lower  respiratory specimens dur  ing the acute phase of infection.  Positive  results are indicative of active infection with SARS-CoV-2.  Clinical  correlation with patient history and other diagnostic information is  necessary to determine patient infection status.  Positive results do  not rule out bacterial infection or co-infection with other viruses. If result is PRESUMPTIVE POSTIVE SARS-CoV-2 nucleic acids MAY BE PRESENT.   A presumptive positive result was obtained on the submitted specimen  and confirmed on repeat testing.  While 2019 novel coronavirus  (SARS-CoV-2) nucleic acids may be present in the submitted sample  additional confirmatory testing  may be necessary for epidemiological  and / or clinical management purposes  to differentiate between  SARS-CoV-2 and other Sarbecovirus currently known to infect humans.  If clinically indicated additional testing with an alternate test  methodology [id number]) is advised. The SARS-CoV-2 RNA is generally  detectable in upper and lower respiratory sp ecimens during the acute  phase of infection. The expected result is Negative. Fact Sheet for Patients:  StrictlyIdeas.no Fact Sheet for Healthcare Providers: BankingDealers.co.za This test is not yet approved or cleared by the [place] FDA and has been authorized for detection and/or diagnosis of SARS-CoV-2 by FDA under an Emergency Use Authorization (EUA).  This EUA will remain in effect (meaning this test can be used) for the duration of the COVID-19 declaration under Section 564(b)(1) of the Act, 21 U.S.C. section 360bbb-3(b)(1), unless the authorization is terminated or revoked sooner. Performed at [facility], [place] [address] [place], [place] [address]       Radiology Studies: Dg Elbow 2 Views Left  Result Date: 09/18/2019 CLINICAL DATA:  78 year old with acute onset of LEFT elbow pain and crepitus that began yesterday. No known injury. EXAM: LEFT ELBOW - 2 VIEW COMPARISON:  No prior elbow imaging. LEFT humerus x-rays 08/27/2019. FINDINGS: Osseous demineralization. No evidence of acute, subacute or healed fractures. Well-preserved joint spaces. No posterior fat pad to indicate a joint effusion. IMPRESSION: No acute or subacute osseous abnormality. Osseous demineralization. Electronically Signed   By: Evangeline Dakin M.D.   On: 09/18/2019 13:48   Ct Angio Chest Pe W And/or Wo Contrast  Result Date: 09/18/2019 CLINICAL DATA:  Shortness of breath EXAM: CT ANGIOGRAPHY CHEST WITH CONTRAST TECHNIQUE: Multidetector CT imaging of the chest was performed using the standard protocol  during bolus administration of intravenous contrast. Multiplanar CT image reconstructions and MIPs were obtained to evaluate the vascular anatomy. CONTRAST:  190mL OMNIPAQUE IOHEXOL 350 MG/ML SOLN COMPARISON:  September 18, 2019 FINDINGS: Cardiovascular: There is a optimal opacification of the pulmonary arteries. There is no central,segmental, or subsegmental filling defects within the pulmonary arteries. There is moderate cardiomegaly. No evidence of right ventricular heart strain. Coronary artery calcifications are seen. There is normal three-vessel brachiocephalic anatomy without proximal stenosis. Scattered aortic atherosclerosis is noted. No aneurysmal dilatation. Mediastinum/Nodes: No hilar, mediastinal, or axillary adenopathy. Thyroid gland, trachea, and esophagus demonstrate no significant findings. Lungs/Pleura: There is a large right and small left pleural effusion. Adjacent basilar consolidation seen at the right lung base. Upper Abdomen: No acute abnormalities present in the visualized portions of the upper abdomen. Musculoskeletal: No chest wall abnormality. No acute or significant osseous findings. Review of the MIP images confirms the above findings. IMPRESSION: 1. No central, segmental, or subsegmental pulmonary embolism. 2. Large right and small left pleural effusion with probable adjacent compressive atelectasis or consolidation. 3. 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 Electronically Signed   By: Kerby Moors  Avutu M.D.   On: 09/18/2019 19:40   Dg Chest Port 1 View  Result Date: 09/18/2019 CLINICAL DATA:  78 year old acute onset of shortness of breath and anxiety. Former smoker. EXAM: PORTABLE CHEST 1 VIEW COMPARISON:  08/25/2019 and earlier. FINDINGS: Cardiac silhouette moderately to markedly enlarged, unchanged. Interval increase in size of the large RIGHT pleural effusion since the 08/25/2019 examination. New small LEFT pleural effusion. Consolidation in the  lower lobes bilaterally, RIGHT greater than LEFT, and in the RIGHT MIDDLE LOBE. Mild pulmonary venous hypertension without overt edema. IMPRESSION: 1. Interval increase in size of the large RIGHT pleural effusion since the 08/25/2019 examination. 2. New small LEFT pleural effusion. 3. Passive atelectasis is favored over pneumonia in the lower lobes bilaterally, RIGHT greater than LEFT, and in the RIGHT MIDDLE LOBE. Electronically Signed   By: Evangeline Dakin M.D.   On: 09/18/2019 13:46      Scheduled Meds: . feeding supplement (ENSURE ENLIVE)  237 mL Oral TID BM  . finasteride  5 mg Oral Daily  . furosemide  40 mg Intravenous Q12H  . insulin aspart  0-15 Units Subcutaneous TID WC  . insulin aspart  0-5 Units Subcutaneous QHS  . magnesium oxide  400 mg Oral BID  . metoprolol succinate  25 mg Oral Daily  . pravastatin  40 mg Oral q1800  . sodium chloride flush  3 mL Intravenous Q12H  . Warfarin - Pharmacist Dosing Inpatient   Does not apply q1800   Continuous Infusions: . sodium chloride       LOS: 1 day      Time spent: 35 minutes   Dessa Phi, DO [facility] 09/19/2019, 2:44 PM   Available via Epic secure chat 7am-7pm After these hours, please refer to coverage provider listed on [email or web address]

## 2019-09-19 NOTE — ED Notes (Signed)
Lunch tray orederd

## 2019-09-19 NOTE — ED Notes (Signed)
Breakfast tray delivered to pt.

## 2019-09-19 NOTE — ED Notes (Signed)
SDU  Paged admitting dr to [facility]

## 2019-09-19 NOTE — ED Notes (Signed)
SDU  Breakfast ordered  

## 2019-09-20 DIAGNOSIS — Z515 Encounter for palliative care: Secondary | ICD-10-CM

## 2019-09-20 DIAGNOSIS — Z7189 Other specified counseling: Secondary | ICD-10-CM

## 2019-09-20 DIAGNOSIS — I509 Heart failure, unspecified: Secondary | ICD-10-CM

## 2019-09-20 LAB — BASIC METABOLIC PANEL
Anion gap: 15 (ref 5–15)
BUN: 5 mg/dL — ABNORMAL LOW (ref 8–23)
CO2: 30 mmol/L (ref 22–32)
Calcium: 8.7 mg/dL — ABNORMAL LOW (ref 8.9–10.3)
Chloride: 98 mmol/L (ref 98–111)
Creatinine, Ser: 0.86 mg/dL (ref 0.61–1.24)
GFR calc Af Amer: >60 mL/min (ref >60)
GFR calc non Af Amer: >60 mL/min (ref >60)
Glucose, Bld: 138 mg/dL — ABNORMAL HIGH (ref 70–99)
Potassium: 3.8 mmol/L (ref 3.5–5.1)
Sodium: 143 mmol/L (ref 135–145)

## 2019-09-20 LAB — PROTIME-INR
INR: 2 — ABNORMAL HIGH (ref 0.8–1.2)
Prothrombin Time: 22.5 seconds — ABNORMAL HIGH (ref 11.4–15.2)

## 2019-09-20 LAB — GLUCOSE, CAPILLARY
Glucose-Capillary: 145 mg/dL — ABNORMAL HIGH (ref 70–99)
Glucose-Capillary: 198 mg/dL — ABNORMAL HIGH (ref 70–99)

## 2019-09-20 LAB — MAGNESIUM: Magnesium: 1.8 mg/dL (ref 1.7–2.4)

## 2019-09-20 MED ORDER — HALOPERIDOL LACTATE 5 MG/ML IJ SOLN: 0.5000 mg | INTRAMUSCULAR | Status: DC | PRN

## 2019-09-20 MED ORDER — LORAZEPAM 2 MG/ML PO CONC
1.0000 mg | ORAL | Status: DC | PRN
  Filled 2019-09-20: qty 0.5

## 2019-09-20 MED ORDER — LORAZEPAM 1 MG PO TABS
1.0000 mg | ORAL_TABLET | ORAL | Status: DC | PRN
  Administered 2019-09-20: 20:00:00 1 mg via ORAL
  Filled 2019-09-20: qty 1

## 2019-09-20 MED ORDER — HALOPERIDOL LACTATE 2 MG/ML PO CONC
0.5000 mg | ORAL | Status: DC | PRN
  Filled 2019-09-20: qty 0.3

## 2019-09-20 MED ORDER — MORPHINE SULFATE (PF) 2 MG/ML IV SOLN: 2.0000 mg | INTRAVENOUS | Status: DC | PRN

## 2019-09-20 MED ORDER — HALOPERIDOL 0.5 MG PO TABS
0.5000 mg | ORAL_TABLET | ORAL | Status: DC | PRN
  Filled 2019-09-20: qty 1

## 2019-09-20 MED ORDER — LORAZEPAM 2 MG/ML IJ SOLN: 1.0000 mg | INTRAMUSCULAR | Status: DC | PRN

## 2019-09-20 NOTE — Progress Notes (Signed)
NCM received consult for residential hospice. NCM spoke with patient's wife Dub Mikes) @ bedside and  confirm d/c plan. Wife agreeable to residential hospice for husband. Choice offered. Wife selected [facility]. Referral made with [facility]. NCM to f/u with needs. Whitman Hero RN, BSN,CM

## 2019-09-20 NOTE — Progress Notes (Signed)
I received report from [place] 09/19/19 around 19:10 pm. Thurmond Butts stated that the patient's wife did not want patient to be on telemetry or to have patient connected to any monitors .  Wife was talking about palliative care for her husband.  Thurmond Butts said he took patient off telemetry as wife requested. Patient is a total care patient.

## 2019-09-20 NOTE — Progress Notes (Signed)
Nutrition Brief Note  Chart reviewed. Pt now transitioning to comfort care.  No further nutrition interventions warranted at this time.  Please re-consult as needed.   Shawnise Peterkin A. Jaykwon Morones, RD, LDN, CDCES Registered Dietitian II Certified Diabetes Care and Education Specialist Pager: [phone] After hours Pager: [phone]  

## 2019-09-20 NOTE — Progress Notes (Signed)
Report given to receiving nurse Judeen Hammans at [facility].

## 2019-09-20 NOTE — Progress Notes (Signed)
[facility] [place] lives in [place] with his wife and this inpt. Facility is closer to there home than the [facility].   Discussed with the wife the hospice philosophy and confirmed that they are interested in the comfort care approach. Reviewed medical records and discussed the case with my Medical Director she has approved him to transfer [facility].   Wife has accepted the bed offer and will be signing paperwork for the transfer with [place] today at 515pm.   Notified CM Levada Dy of the bed offered and that they accepted. She is contacting MD to let him know and see if pt can be discharged.   Webb Silversmith RN [phone]

## 2019-09-20 NOTE — Progress Notes (Signed)
PROGRESS NOTE    Charles [name]  [id number] DOB: 1941/08/01 DOA: 09/18/2019 PCP: Cassandria Anger, MD     Brief Narrative:  Charles [name] is a 78 y.o. male with medical history significant of chronic systolic and diastolic congestive heart failure, CAD managed medically, type 2 diabetes mellitus, sleep apnea, COPD, history of CVA, permanent atrial fibrillation on Coumadin and several other comorbidities with recurrent hospitalizations with latest discharge being on 09/06/2019 for acute hypoxic respiratory failure (required BiPAP) and AKI (required CRRT) and acute on chronic systolic and diastolic congestive heart failure was brought into the ED from his nursing home after he was found to have some shortness of breath and hypoxia.  Reportedly, he was doing fine up until around noontime when nursing staff found him in his room having some shortness of breath and his oxygen saturation was 70% on room air.  He was slightly altered and he was brought into the emergency department.   Upon arrival to the ED, his oxygen saturation was 72% on room air.  He was placed on oxygen and currently on 4 L of nasal cannula.  Further work-up included chest x-ray which showed congestive heart failure and bilateral pleural effusion, right more than the left and worsened from recent chest x-ray. BNP was 1374 while his baseline seems to be around 500 area.  He was given dose of Lasix and hospital service was consulted for admission. COVID-19 test negative.   New events last 24 hours / Subjective: Remains confused on my examination, very poor historian, denies any shortness of breath on examination today.  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 Residential hospice work up pending.    Consultants:   Palliative care  Procedures:   None   Antimicrobials:  Anti-infectives (From admission, onward)   None       Objective: Vitals:   09/19/19 1209 09/19/19 1955 09/19/19 2017 09/20/19 0621  BP: 134/85  134/61 130/82  Pulse: 96  84 83  Resp: 18  (!) 24 20  Temp:   98.4 F (36.9 C) 98 F (36.7 C)  TempSrc:   Oral Oral  SpO2: 100%  100% 92%  Weight:  131.4 kg    Height:  6\' 4"  (1.93 m)      Intake/Output Summary (Last 24 hours) at 09/20/2019 1137 Last data filed at 09/20/2019 0845 Gross per 24 hour  Intake 170 ml  Output -  Net 170 ml   Filed Weights   09/19/19 1955  Weight: 131.4 kg    Examination: General exam: Appears calm but very confused Respiratory system: Clear to auscultation anteriorly, could no assess posterior lung fields  Cardiovascular system: S1 & S2 heard, irreg rhythm  Gastrointestinal system: Abdomen is nondistended, soft  Central nervous system: Alert Extremities: Symmetri Psychiatry: Judgement and insight appear very poor,  dementia    Data Reviewed: I have personally reviewed following labs and imaging studies  CBC: Recent Labs  Lab 09/18/19 1306  WBC 7.1  NEUTROABS 5.8  HGB 11.1*  HCT 37.0*  MCV 95.4  PLT 0000000   Basic Metabolic Panel: Recent Labs  Lab 09/18/19 1306 09/18/19 1727 09/19/19 0459 09/20/19 0246  NA 142  --  142 143  K 3.6  --  3.1* 3.8  CL 99  --  97* 98  CO2 33*  --  31 30  GLUCOSE  113*  --  156* 138*  BUN 7*  --  7* 5*  CREATININE 1.12  --  0.98 0.86  CALCIUM 9.0  --  8.6* 8.7*  MG  --  1.5*  --  1.8   GFR: Estimated Creatinine Clearance: 104.7 mL/min (by C-G formula based on SCr of 0.86 mg/dL). Liver Function Tests: No results for input(s): AST, ALT, ALKPHOS, BILITOT, PROT, ALBUMIN in the last 168 hours. No results for input(s): LIPASE, AMYLASE in the last 168 hours. No results for input(s): AMMONIA in the last 168 hours. Coagulation Profile: Recent Labs  Lab 09/18/19 1306 09/19/19 0459 09/20/19 0538  INR 3.9* 3.7* 2.0*   Cardiac Enzymes: No results for input(s): CKTOTAL, CKMB, CKMBINDEX, TROPONINI in the last 168 hours. BNP (last 3 results) No results for input(s): PROBNP in the last 8760 hours. HbA1C: No results for input(s): HGBA1C in the last 72 hours. CBG: Recent Labs  Lab 09/19/19 0820 09/19/19 1144 09/19/19 1807 09/19/19 2300 09/20/19 0845  GLUCAP 158* 129* 107* 124* 145*   Lipid Profile: No results for input(s): CHOL, HDL, LDLCALC, TRIG, CHOLHDL, LDLDIRECT in the last 72 hours. Thyroid Function Tests: No results for input(s): TSH, T4TOTAL, FREET4, T3FREE, THYROIDAB in the last 72 hours. Anemia Panel: No results for input(s): VITAMINB12, FOLATE, FERRITIN, TIBC, IRON, RETICCTPCT in the last 72 hours. Sepsis Labs: No results for input(s): PROCALCITON, LATICACIDVEN in the last 168 hours.  Recent Results (from the past 240 hour(s))  SARS Coronavirus 2 [facility] order, Performed in [facility] hospital lab) Nasopharyngeal Nasopharyngeal Swab     Status: None   Collection Time: 09/18/19  4:12 PM   Specimen: Nasopharyngeal Swab  Result Value Ref Range Status   SARS Coronavirus 2 NEGATIVE NEGATIVE Final    Comment: (NOTE) If result is NEGATIVE SARS-CoV-2 target nucleic acids are NOT DETECTED. The SARS-CoV-2 RNA is generally detectable in upper and lower  respiratory specimens during the acute phase of infection. The lowest  concentration  of SARS-CoV-2 viral copies this assay can detect is 250  copies / mL. A negative result does not preclude SARS-CoV-2 infection  and should not be used as the sole basis for treatment or other  patient management decisions.  A negative result may occur with  improper specimen collection / handling, submission of specimen other  than nasopharyngeal swab, presence of viral mutation(s) within the  areas targeted by this assay, and inadequate number of viral copies  (<250 copies / mL). A negative result must be combined with clinical  observations, patient history, and epidemiological information. If result is POSITIVE SARS-CoV-2 target nucleic acids are DETECTED. The SARS-CoV-2 RNA is generally detectable in upper and lower  respiratory specimens dur ing the acute phase of infection.  Positive  results are indicative of active infection with SARS-CoV-2.  Clinical  correlation with patient history and other diagnostic information is  necessary to determine patient infection status.  Positive results do  not rule out bacterial  infection or co-infection with other viruses. If result is PRESUMPTIVE POSTIVE SARS-CoV-2 nucleic acids MAY BE PRESENT.   A presumptive positive result was obtained on the submitted specimen  and confirmed on repeat testing.  While 2019 novel coronavirus  (SARS-CoV-2) nucleic acids may be present in the submitted sample  additional confirmatory testing may be necessary for epidemiological  and / or clinical management purposes  to differentiate between  SARS-CoV-2 and other Sarbecovirus currently known to infect humans.  If clinically indicated additional testing with an alternate test  methodology [phone]) is advised. The SARS-CoV-2 RNA is generally  detectable in upper and lower respiratory sp ecimens during the acute  phase of infection. The expected result is Negative. Fact Sheet for Patients:  StrictlyIdeas.no Fact Sheet for Healthcare  Providers: BankingDealers.co.za This test is not yet approved or cleared by the [place] FDA and has been authorized for detection and/or diagnosis of SARS-CoV-2 by FDA under an Emergency Use Authorization (EUA).  This EUA will remain in effect (meaning this test can be used) for the duration of the COVID-19 declaration under Section 564(b)(1) of the Act, 21 U.S.C. section 360bbb-3(b)(1), unless the authorization is terminated or revoked sooner. Performed at [facility], [place] [address], [place], [place] [address]       Radiology Studies: Dg Elbow 2 Views Left  Result Date: 09/18/2019 CLINICAL DATA:  78 year old with acute onset of LEFT elbow pain and crepitus that began yesterday. No known injury. EXAM: LEFT ELBOW - 2 VIEW COMPARISON:  No prior elbow imaging. LEFT humerus x-rays 08/27/2019. FINDINGS: Osseous demineralization. No evidence of acute, subacute or healed fractures. Well-preserved joint spaces. No posterior fat pad to indicate a joint effusion. IMPRESSION: No acute or subacute osseous abnormality. Osseous demineralization. Electronically Signed   By: Evangeline Dakin M.D.   On: 09/18/2019 13:48   Ct Angio Chest Pe W And/or Wo Contrast  Result Date: 09/18/2019 CLINICAL DATA:  Shortness of breath EXAM: CT ANGIOGRAPHY CHEST WITH CONTRAST TECHNIQUE: Multidetector CT imaging of the chest was performed using the standard protocol during bolus administration of intravenous contrast. Multiplanar CT image reconstructions and MIPs were obtained to evaluate the vascular anatomy. CONTRAST:  140mL OMNIPAQUE IOHEXOL 350 MG/ML SOLN COMPARISON:  September 18, 2019 FINDINGS: Cardiovascular: There is a optimal opacification of the pulmonary arteries. There is no central,segmental, or subsegmental filling defects within the pulmonary arteries. There is moderate cardiomegaly. No evidence of right ventricular heart strain. Coronary artery calcifications are seen. There is  normal three-vessel brachiocephalic anatomy without proximal stenosis. Scattered aortic atherosclerosis is noted. No aneurysmal dilatation. Mediastinum/Nodes: No hilar, mediastinal, or axillary adenopathy. Thyroid gland, trachea, and esophagus demonstrate no significant findings. Lungs/Pleura: There is a large right and small left pleural effusion. Adjacent basilar consolidation seen at the right lung base. Upper Abdomen: No acute abnormalities present in the visualized portions of the upper abdomen. Musculoskeletal: No chest wall abnormality. No acute or significant osseous findings. Review of the MIP images confirms the above findings. IMPRESSION: 1. No central, segmental, or subsegmental pulmonary embolism. 2. Large right and small left pleural effusion with probable adjacent compressive atelectasis or consolidation. 3. 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 Electronically Signed   By: Prudencio Pair M.D.   On: 09/18/2019 19:40   Dg Chest Port 1 View  Result Date: 09/18/2019 CLINICAL DATA:  78 year old acute onset of shortness of breath and anxiety. Former smoker. EXAM: PORTABLE CHEST 1 VIEW COMPARISON:  08/25/2019 and earlier. FINDINGS: Cardiac silhouette  moderately to markedly enlarged, unchanged. Interval increase in size of the large RIGHT pleural effusion since the 08/25/2019 examination. New small LEFT pleural effusion. Consolidation in the lower lobes bilaterally, RIGHT greater than LEFT, and in the RIGHT MIDDLE LOBE. Mild pulmonary venous hypertension without overt edema. IMPRESSION: 1. Interval increase in size of the large RIGHT pleural effusion since the 08/25/2019 examination. 2. New small LEFT pleural effusion. 3. Passive atelectasis is favored over pneumonia in the lower lobes bilaterally, RIGHT greater than LEFT, and in the RIGHT MIDDLE LOBE. Electronically Signed   By: Evangeline Dakin M.D.   On: 09/18/2019 13:46      Scheduled Meds: . feeding supplement  (ENSURE ENLIVE)  237 mL Oral TID BM  . furosemide  40 mg Intravenous Q12H   Continuous Infusions:    LOS: 2 days      Time spent: 25 minutes   Dessa Phi, DO [facility] 09/20/2019, 11:37 AM   Available via Epic secure chat 7am-7pm After these hours, please refer to coverage provider listed on [email or web address]
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
## 2019-09-20 NOTE — Discharge Summary (Signed)
Physician Discharge Summary  Charles [name] [id number] DOB: 1941-08-10 DOA: 09/18/2019  PCP: Cassandria Anger, MD  Admit date: 09/18/2019 Discharge date: 09/20/2019  Admitted From: Home Disposition:  Hospice CODE STATUS: DNR   Brief/Interim Summary: Charles [name] a 78 y.o.malewith medical history significant ofchronic systolic and diastolic congestive heart failure, CAD managed medically, type 2 diabetes mellitus, sleep apnea, COPD, history of CVA, permanent atrial fibrillation on Coumadin and several other comorbidities with recurrent hospitalizations with latest discharge being on 09/06/2019 for acute hypoxic respiratory failure (required BiPAP) and AKI (required CRRT) and acute on chronic systolic and diastolic congestive heart failure was brought into the ED from his nursing home after he was found to have some shortness of breath and hypoxia. Reportedly, he was doing fine up until around noontime when nursing staff found him in his room having some shortness of breath and his oxygen saturation was 70% on room air. He was slightly altered and he was brought into the emergency department.   Upon arrival to the ED, his oxygen saturation was 72% on room air. He was placed on oxygen and currently on 4 L of nasal cannula. Further work-up included chest x-ray which showed congestive heart failure and bilateral pleural effusion, right more than the left and worsened from recent chest x-ray. BNP was 1374while his baseline seems to be around 500 area. He was given dose of Lasix and hospital service was consulted for admission for respiratory failure and heart failure. COVID-19 test negative.   Due to his multiple hospitalizations, refusal to take his medications and food at previous SNF, and overall deterioration and poor quality of life, wife discussed with palliative care and decided to focus on comfort and transition to hospice.   Discharge Diagnoses:   Acute hypoxemic respiratory  failure Acute on chronic systolic and diastolic heart failure LV thrombus Permanent atrial fibrillation Dementia with behavioral disturbance Diabetes mellitus type 2 Chronic fracture of the left femoral head Hypokalemia Hypomagnesemia HLD   Discharge Instructions   Allergies as of 09/20/2019      Reactions   Tolterodine Swelling, Other (See Comments)   (Detrol) Dry mouth/dry throat, severe swelling of the legs, and started a "downward spiral"   Lipitor [atorvastatin] Other (See Comments)   Muscle aches   Xarelto [rivaroxaban] Rash      Medication List    STOP taking these medications   albuterol 108 (90 Base) MCG/ACT inhaler Commonly known as: VENTOLIN HFA   ARIPiprazole 10 MG tablet Commonly known as: ABILIFY   divalproex 250 MG DR tablet Commonly known as: DEPAKOTE   feeding supplement Liqd Commonly known as: BOOST / RESOURCE BREEZE   finasteride 5 MG tablet Commonly known as: PROSCAR   Fluticasone-Salmeterol 100-50 MCG/DOSE Aepb Commonly known as: Advair Diskus   furosemide 40 MG tablet Commonly known as: LASIX   insulin aspart 100 UNIT/ML injection Commonly known as: novoLOG   magnesium oxide 400 (241.3 Mg) MG tablet Commonly known as: MAG-OX   Melatonin 5 MG Tabs   metFORMIN 500 MG tablet Commonly known as: GLUCOPHAGE   metoprolol succinate 25 MG 24 hr tablet Commonly known as: TOPROL-XL   nitroGLYCERIN 0.4 MG SL tablet Commonly known as: NITROSTAT   potassium chloride 10 MEQ tablet Commonly known as: KLOR-CON   potassium chloride SA 20 MEQ tablet Commonly known as: KLOR-CON   pravastatin 40 MG tablet Commonly known as: PRAVACHOL   ranolazine 500 MG 12 hr tablet Commonly known as: RANEXA   torsemide 20 MG tablet  Commonly known as: DEMADEX   vitamin B-12 1000 MCG tablet Commonly known as: CYANOCOBALAMIN   Vitamin D-3 25 MCG (1000 UT) Caps   warfarin 7.5 MG tablet Commonly known as: COUMADIN       Allergies  Allergen  Reactions  . Tolterodine Swelling and Other (See Comments)    (Detrol) Dry mouth/dry throat, severe swelling of the legs, and started a "downward spiral"  . Lipitor [Atorvastatin] Other (See Comments)    Muscle aches  . Xarelto [Rivaroxaban] Rash    Consultations:  Palliative care    Procedures/Studies: Dg Chest 1 View  Result Date: 08/24/2019 CLINICAL DATA:  77 year old male with central line placement. EXAM: CHEST  1 VIEW COMPARISON:  Chest radiograph dated 06/02 FINDINGS: Right IJ central venous line with tip close to the cavoatrial junction. No pneumothorax. There is a right pleural effusion with right lung base atelectasis. Infiltrate is not excluded. Clinical correlation is recommended. The left lung is clear. Stable cardiomegaly. Atherosclerotic calcification of the aortic arch. No acute osseous pathology. Osteopenia with degenerative changes of the spine. Old left humeral neck fracture. IMPRESSION: 1. Right IJ central venous line with tip close to the cavoatrial junction. No pneumothorax. 2. Right pleural effusion with right lung base atelectasis versus infiltrate. Electronically Signed   By: Anner Crete M.D.   On: 08/24/2019 18:32   Dg Elbow 2 Views Left  Result Date: 09/18/2019 CLINICAL DATA:  78 year old with acute onset of LEFT elbow pain and crepitus that began yesterday. No known injury. EXAM: LEFT ELBOW - 2 VIEW COMPARISON:  No prior elbow imaging. LEFT humerus x-rays 08/27/2019. FINDINGS: Osseous demineralization. No evidence of acute, subacute or healed fractures. Well-preserved joint spaces. No posterior fat pad to indicate a joint effusion. IMPRESSION: No acute or subacute osseous abnormality. Osseous demineralization. Electronically Signed   By: Evangeline Dakin M.D.   On: 09/18/2019 13:48   Ct Angio Chest Pe W And/or Wo Contrast  Result Date: 09/18/2019 CLINICAL DATA:  Shortness of breath EXAM: CT ANGIOGRAPHY CHEST WITH CONTRAST TECHNIQUE: Multidetector CT imaging of  the chest was performed using the standard protocol during bolus administration of intravenous contrast. Multiplanar CT image reconstructions and MIPs were obtained to evaluate the vascular anatomy. CONTRAST:  149mL OMNIPAQUE IOHEXOL 350 MG/ML SOLN COMPARISON:  September 18, 2019 FINDINGS: Cardiovascular: There is a optimal opacification of the pulmonary arteries. There is no central,segmental, or subsegmental filling defects within the pulmonary arteries. There is moderate cardiomegaly. No evidence of right ventricular heart strain. Coronary artery calcifications are seen. There is normal three-vessel brachiocephalic anatomy without proximal stenosis. Scattered aortic atherosclerosis is noted. No aneurysmal dilatation. Mediastinum/Nodes: No hilar, mediastinal, or axillary adenopathy. Thyroid gland, trachea, and esophagus demonstrate no significant findings. Lungs/Pleura: There is a large right and small left pleural effusion. Adjacent basilar consolidation seen at the right lung base. Upper Abdomen: No acute abnormalities present in the visualized portions of the upper abdomen. Musculoskeletal: No chest wall abnormality. No acute or significant osseous findings. Review of the MIP images confirms the above findings. IMPRESSION: 1. No central, segmental, or subsegmental pulmonary embolism. 2. Large right and small left pleural effusion with probable adjacent compressive atelectasis or consolidation. 3. 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 Electronically Signed   By: Prudencio Pair M.D.   On: 09/18/2019 19:40   US Renal  Result Date: 08/24/2019 CLINICAL DATA:  Acute renal failure EXAM: RENAL / URINARY TRACT ULTRASOUND COMPLETE COMPARISON:  None. FINDINGS: Somewhat limited exam due to overlying  bowel gas. Right Kidney: Renal measurements: 13.3 x 6.6 x 6.7 = volume: 316 mL . Echogenicity within normal limits. No mass or hydronephrosis visualized. Left Kidney: Renal measurements: 11.8  x 6.2 x 7.0 = volume: 266 mL. Echogenicity within normal limits. No mass or hydronephrosis visualized. Bladder: The bladder is partially distended there is a heterogeneous nodular appearance to the prostate which protrudes into the posterior bladder. IMPRESSION: Somewhat limited exam due to overlying bowel gas, however normal appearing kidneys. Enlarged nodular prostate protruding into the posterior bladder. Electronically Signed   By: Prudencio Pair M.D.   On: 08/24/2019 02:18   Dg Chest Port 1 View  Result Date: 09/18/2019 CLINICAL DATA:  78 year old acute onset of shortness of breath and anxiety. Former smoker. EXAM: PORTABLE CHEST 1 VIEW COMPARISON:  08/25/2019 and earlier. FINDINGS: Cardiac silhouette moderately to markedly enlarged, unchanged. Interval increase in size of the large RIGHT pleural effusion since the 08/25/2019 examination. New small LEFT pleural effusion. Consolidation in the lower lobes bilaterally, RIGHT greater than LEFT, and in the RIGHT MIDDLE LOBE. Mild pulmonary venous hypertension without overt edema. IMPRESSION: 1. Interval increase in size of the large RIGHT pleural effusion since the 08/25/2019 examination. 2. New small LEFT pleural effusion. 3. Passive atelectasis is favored over pneumonia in the lower lobes bilaterally, RIGHT greater than LEFT, and in the RIGHT MIDDLE LOBE. Electronically Signed   By: Evangeline Dakin M.D.   On: 09/18/2019 13:46   Dg Chest Port 1 View  Result Date: 08/25/2019 CLINICAL DATA:  CHF EXAM: PORTABLE CHEST 1 VIEW COMPARISON:  Yesterday FINDINGS: Shortening of the right IJ catheter, tip near the SVC brachiocephalic confluence. Cardiomegaly with haziness of the right chest from pleural effusion. No superimposed Kerley lines or air bronchogram. IMPRESSION: 1. Shorter right IJ catheter, tip near the SVC brachiocephalic confluence. 2. Cardiomegaly and right pleural effusion that is unchanged. Electronically Signed   By: Monte Fantasia M.D.   On:  08/25/2019 07:54   Dg Chest Port 1 View  Result Date: 08/23/2019 CLINICAL DATA:  Dyspnea EXAM: PORTABLE CHEST 1 VIEW COMPARISON:  06/01/2017 FINDINGS: Cardiomegaly. Mild pulmonary vascular congestion. Calcific aortic knob. Right basilar opacity. No pneumothorax. Advanced degenerative changes of the shoulders. IMPRESSION: 1. Right basilar opacity which may reflect pleural effusion with atelectasis and/or pneumonia. 2. Cardiomegaly and pulmonary vascular congestion suggesting CHF. Electronically Signed   By: Davina Poke M.D.   On: 08/23/2019 17:12   Dg Humerus Left  Result Date: 08/27/2019 CLINICAL DATA:  Recent fall. Left humerus pain. EXAM: LEFT HUMERUS - 2+ VIEW COMPARISON:  None. FINDINGS: An impacted fracture of the left humeral neck is seen. No evidence of shoulder dislocation. Generalized osteopenia is demonstrated. Degenerative spurring of acromioclavicular joint and distal acromion process noted. IMPRESSION: Impacted fracture of left humeral neck. Electronically Signed   By: Marlaine Hind M.D.   On: 08/27/2019 17:17     Discharge Exam: Vitals:   09/20/19 0621 09/20/19 1354  BP: 130/82 114/71  Pulse: 83 90  Resp: 20 18  Temp: 98 F (36.7 C) 98.9 F (37.2 C)  SpO2: 92% (!) 89%    General exam: Appears calm but very confused Respiratory system: Clear to auscultation anteriorly, could no assess posterior lung fields  Cardiovascular system: S1 & S2 heard, irreg rhythm  Gastrointestinal system: Abdomen is nondistended, soft  Central nervous system: Alert Extremities: Symmetri Psychiatry: Judgement and insight appear very poor, dementia    The results of significant diagnostics from this hospitalization (including imaging, microbiology, ancillary  and laboratory) are listed below for reference.     Microbiology: Recent Results (from the past 240 hour(s))  SARS Coronavirus 2 [facility] order, Performed in [facility] hospital lab) Nasopharyngeal Nasopharyngeal Swab     Status:  None   Collection Time: 09/18/19  4:12 PM   Specimen: Nasopharyngeal Swab  Result Value Ref Range Status   SARS Coronavirus 2 NEGATIVE NEGATIVE Final    Comment: (NOTE) If result is NEGATIVE SARS-CoV-2 target nucleic acids are NOT DETECTED. The SARS-CoV-2 RNA is generally detectable in upper and lower  respiratory specimens during the acute phase of infection. The lowest  concentration of SARS-CoV-2 viral copies this assay can detect is 250  copies / mL. A negative result does not preclude SARS-CoV-2 infection  and should not be used as the sole basis for treatment or other  patient management decisions.  A negative result may occur with  improper specimen collection / handling, submission of specimen other  than nasopharyngeal swab, presence of viral mutation(s) within the  areas targeted by this assay, and inadequate number of viral copies  (<250 copies / mL). A negative result must be combined with clinical  observations, patient history, and epidemiological information. If result is POSITIVE SARS-CoV-2 target nucleic acids are DETECTED. The SARS-CoV-2 RNA is generally detectable in upper and lower  respiratory specimens dur ing the acute phase of infection.  Positive  results are indicative of active infection with SARS-CoV-2.  Clinical  correlation with patient history and other diagnostic information is  necessary to determine patient infection status.  Positive results do  not rule out bacterial infection or co-infection with other viruses. If result is PRESUMPTIVE POSTIVE SARS-CoV-2 nucleic acids MAY BE PRESENT.   A presumptive positive result was obtained on the submitted specimen  and confirmed on repeat testing.  While 2019 novel coronavirus  (SARS-CoV-2) nucleic acids may be present in the submitted sample  additional confirmatory testing may be necessary for epidemiological  and / or clinical management purposes  to differentiate between  SARS-CoV-2 and other  Sarbecovirus currently known to infect humans.  If clinically indicated additional testing with an alternate test  methodology [phone]) is advised. The SARS-CoV-2 RNA is generally  detectable in upper and lower respiratory sp ecimens during the acute  phase of infection. The expected result is Negative. Fact Sheet for Patients:  StrictlyIdeas.no Fact Sheet for Healthcare Providers: BankingDealers.co.za This test is not yet approved or cleared by the [place] FDA and has been authorized for detection and/or diagnosis of SARS-CoV-2 by FDA under an Emergency Use Authorization (EUA).  This EUA will remain in effect (meaning this test can be used) for the duration of the COVID-19 declaration under Section 564(b)(1) of the Act, 21 U.S.C. section 360bbb-3(b)(1), unless the authorization is terminated or revoked sooner. Performed at [facility], [place] [address] [place], [place] [address]      Labs: BNP (last 3 results) Recent Labs    08/23/19 1650 08/25/19 0343 09/18/19 1306  BNP 1,871.6* 878.3* [id number]*   Basic Metabolic Panel: Recent Labs  Lab 09/18/19 1306 09/18/19 1727 09/19/19 0459 09/20/19 0246  NA 142  --  142 143  K 3.6  --  3.1* 3.8  CL 99  --  97* 98  CO2 33*  --  31 30  GLUCOSE 113*  --  156* 138*  BUN 7*  --  7* 5*  CREATININE 1.12  --  0.98 0.86  CALCIUM 9.0  --  8.6* 8.7*  MG  --  1.5*  --  1.8   Liver Function Tests: No results for input(s): AST, ALT, ALKPHOS, BILITOT, PROT, ALBUMIN in the last 168 hours. No results for input(s): LIPASE, AMYLASE in the last 168 hours. No results for input(s): AMMONIA in the last 168 hours. CBC: Recent Labs  Lab 09/18/19 1306  WBC 7.1  NEUTROABS 5.8  HGB 11.1*  HCT 37.0*  MCV 95.4  PLT 246   Cardiac Enzymes: No results for input(s): CKTOTAL, CKMB, CKMBINDEX, TROPONINI in the last 168 hours. BNP: Invalid input(s): POCBNP CBG: Recent Labs  Lab  09/19/19 0820 09/19/19 1144 09/19/19 1807 09/19/19 2300 09/20/19 0845  GLUCAP 158* 129* 107* 124* 145*   D-Dimer No results for input(s): DDIMER in the last 72 hours. Hgb A1c No results for input(s): HGBA1C in the last 72 hours. Lipid Profile No results for input(s): CHOL, HDL, LDLCALC, TRIG, CHOLHDL, LDLDIRECT in the last 72 hours. Thyroid function studies No results for input(s): TSH, T4TOTAL, T3FREE, THYROIDAB in the last 72 hours.  Invalid input(s): FREET3 Anemia work up No results for input(s): VITAMINB12, FOLATE, FERRITIN, TIBC, IRON, RETICCTPCT in the last 72 hours. Urinalysis    Component Value Date/Time   COLORURINE AMBER (A) 08/23/2019 2231   APPEARANCEUR HAZY (A) 08/23/2019 2231   LABSPEC 1.017 08/23/2019 2231   PHURINE 5.0 08/23/2019 2231   GLUCOSEU NEGATIVE 08/23/2019 2231   GLUCOSEU NEGATIVE 05/25/2019 1353   HGBUR MODERATE (A) 08/23/2019 2231   BILIRUBINUR NEGATIVE 08/23/2019 2231   KETONESUR NEGATIVE 08/23/2019 2231   PROTEINUR 100 (A) 08/23/2019 2231   UROBILINOGEN 1.0 05/25/2019 1353   NITRITE NEGATIVE 08/23/2019 2231   LEUKOCYTESUR SMALL (A) 08/23/2019 2231   Sepsis Labs Invalid input(s): PROCALCITONIN,  WBC,  LACTICIDVEN Microbiology Recent Results (from the past 240 hour(s))  SARS Coronavirus 2 [facility] order, Performed in [facility] hospital lab) Nasopharyngeal Nasopharyngeal Swab     Status: None   Collection Time: 09/18/19  4:12 PM   Specimen: Nasopharyngeal Swab  Result Value Ref Range Status   SARS Coronavirus 2 NEGATIVE NEGATIVE Final    Comment: (NOTE) If result is NEGATIVE SARS-CoV-2 target nucleic acids are NOT DETECTED. The SARS-CoV-2 RNA is generally detectable in upper and lower  respiratory specimens during the acute phase of infection. The lowest  concentration of SARS-CoV-2 viral copies this assay can detect is 250  copies / mL. A negative result does not preclude SARS-CoV-2 infection  and should not be used as the sole basis  for treatment or other  patient management decisions.  A negative result may occur with  improper specimen collection / handling, submission of specimen other  than nasopharyngeal swab, presence of viral mutation(s) within the  areas targeted by this assay, and inadequate number of viral copies  (<250 copies / mL). A negative result must be combined with clinical  observations, patient history, and epidemiological information. If result is POSITIVE SARS-CoV-2 target nucleic acids are DETECTED. The SARS-CoV-2 RNA is generally detectable in upper and lower  respiratory specimens dur ing the acute phase of infection.  Positive  results are indicative of active infection with SARS-CoV-2.  Clinical  correlation with patient history and other diagnostic information is  necessary to determine patient infection status.  Positive results do  not rule out bacterial infection or co-infection with other viruses. If result is PRESUMPTIVE POSTIVE SARS-CoV-2 nucleic acids MAY BE PRESENT.   A presumptive positive result was obtained on the submitted specimen  and confirmed on repeat testing.  While 2019 novel coronavirus  (SARS-CoV-2) nucleic acids may be present in the submitted sample  additional confirmatory testing may be necessary for epidemiological  and / or clinical management purposes  to differentiate between  SARS-CoV-2 and other Sarbecovirus currently known to infect humans.  If clinically indicated additional testing with an alternate test  methodology [phone]) is advised. The SARS-CoV-2 RNA is generally  detectable in upper and lower respiratory sp ecimens during the acute  phase of infection. The expected result is Negative. Fact Sheet for Patients:  StrictlyIdeas.no Fact Sheet for Healthcare Providers: BankingDealers.co.za This test is not yet approved or cleared by the [place] FDA and has been authorized for detection and/or  diagnosis of SARS-CoV-2 by FDA under an Emergency Use Authorization (EUA).  This EUA will remain in effect (meaning this test can be used) for the duration of the COVID-19 declaration under Section 564(b)(1) of the Act, 21 U.S.C. section 360bbb-3(b)(1), unless the authorization is terminated or revoked sooner. Performed at [facility], [place] [address], [place], [place] [address]      Patient was seen and examined on the day of discharge and was found to be in stable condition. Time coordinating discharge: 40 minutes including assessment and coordination of care, as well as examination of the patient.   SIGNED:  Dessa Phi, DO [facility] 09/20/2019, 5:10 PM

## 2019-09-20 NOTE — Consult Note (Addendum)
Consultation Note Date: 09/20/2019   Patient Name: Charles [name]  DOB: [birth year]  MRN: [id number]  Age / Sex: 78 y.o., male  PCP: Plotnikov, Evie Lacks, MD Referring Physician: Dessa Phi, DO  Reason for Consultation: Establishing goals of care  HPI/Patient Profile: 78 y.o. male  with past medical history of CHF, CAD, T2DM, OSA, COPD, CVA, dementia, LV thrombus, and a fib on coumadin admitted on 09/18/2019 with respiratory failure. Recently admitted and discharge on 9/24 for respiratory failure, AKI, and heart failure exacerbation. This admission, CXR revealed heart failure and bilateral pleural effusion.  PMT consulted for [place].   Clinical Assessment and Goals of Care: I have reviewed medical records including EPIC notes, labs and imaging, and received report from RN - RN reports that patient is very confused, poor PO intake.    I then spoke with patient's wife to discuss diagnosis prognosis, GOC, EOL wishes, disposition and options.  I introduced Palliative Medicine as specialized medical care for people living with serious illness. It focuses on providing relief from the symptoms and stress of a serious illness. The goal is to improve quality of life for both the patient and the family.  We discussed a brief life review of the patient. Wife explains her grief over the situation - she reflects on what a kind, gentle man the patient was and now she doesn't see that side of him often r/t dementia and delirium.   As far as functional and nutritional status, wife describes a rapid deterioration. Dependent in ADLs. Significant cognitive decline - patient typically does not recognize wife. Refuses most PO intake. Refuses medications and was doing so at facility as well.   She tells me about his frequent hospitalizations and rehab stays. We discussed his last hospitalization - just discharged 9/24 - during that hospitalization he required bipap and  temporary dialysis - then 12 days later he required rehospitalization d/t significant respiratory failure - found with oxygen saturation in the 70s.    We discussed his current illness and what it means in the larger context of his on-going co-morbidities.  Natural disease trajectory and expectations at EOL were discussed. We discussed his heart failure and rapid fluid reaccumulation. We discussed his numerous comorbidities that put him at risk for acute event. Wife understands patient is nearing end of life and tells me she wants to focus solely on his comfort and does not want to prolong his suffering.   We discussed full comfort measures - no further lab work or CBGs, no cardiac monitoring - wife does not want the burden of these on the patient. Patient has also been refusing PO medications and oxygen administration. We discussed aggressive symptom management for any air hunger and delirium.   We discussed disposition options - wife fears she will be unable to care for him at home and would like his symptoms (agitation, delirium, and shortness of breath) aggressively managed. She is interested in hospice facility placement for these reasons.   Questions and concerns were addressed. The family was encouraged to call with questions or concerns.   Primary Decision Maker NEXT OF KIN - wife Givon Donahoo    SUMMARY OF RECOMMENDATIONS   - full comfort care - d/c labs, d/c cardiac monitoring, d/c all PO meds including anticoagulation as patient has been refusing - aggressive symptom management - PRN morphine, ativan, and haldol ordered, will continue IV lasix for symptom management purposes - wife interested in hospice facility placement d/t poor prognosis and desire for aggressive  symptom management  Discussed plan with social work, Therapist, sports, and Dr. Maylene Roes  Code Status/Advance Care Planning:  DNR  Additional Recommendations (Limitations, Scope, Preferences):  Full Comfort Care   Psycho-social/Spiritual:   Desire for further Chaplaincy support:no  Additional Recommendations: Education on Hospice  Prognosis:   Very poor prognosis r/t respiratory failure, failure to thrive, high risk of acute event, desire to focus solely on comfort/not prolong suffering  Discharge Planning: Hospice facility - per wife they are close to [place], [employer], and [employer] so she is okay with any of the hospice facilities in these areas     Primary Diagnoses: Present on Admission: . Acute respiratory failure with hypoxia ([place]) . Essential hypertension . DM type 2 causing neurological disease ([place]) . Acute on chronic combined systolic (congestive) and diastolic (congestive) heart failure ([place]) . Permanent atrial fibrillation ([place])   I have reviewed the medical record, interviewed the patient and family, and examined the patient. The following aspects are pertinent.  Past Medical History:  Diagnosis Date  . Arthritis   . B12 deficiency   . CAD (coronary artery disease)    a. Approx. 2000 - MI. Cath: single vsl dz, PTCA dLAD/medical rx;  b. NSTEMI 11/13 => IVUS attempted for RCA but not successful; anatomy felt stable from 2000 => med Rx. c. [place] 08/2016: severe stable diffuse dLAD, severe prox RCA -> PCI of RCA unsuccessful; d. 05/2017 NSTEMI/Cath: LM nl, LAD 20ost/p, 18m, 80d, LCX 30ost, 20p, 8m, OM2 90, RCA 80p, 99d.  . Cancer ([place])    skin cancer- head   . Cataracts   . Cholelithiasis   . Chronic diastolic CHF (congestive heart failure) ([place])    a. 05/2017 Echo: EF 60-65%, Gr2 DD, mildly dil Ao, triv MR, mildly to mod dil LA, mild PR.  . Claustrophobia   . COPD (chronic obstructive pulmonary disease) ([place])   . CVA (cerebral vascular accident) ([place]) ~ 2001   vision inparted from stroke.  Visual Memory loss  . Diabetic neuropathy ([place])   . Diverticulitis   . Dysrhythmia    if he does not take Metoprolol- Afib  . ED (erectile dysfunction)   . Essential  hypertension   . History of MRSA infection    Right foot  . History of stomach ulcers   . HOH (hard of hearing)   . Hypercholesterolemia   . Legally blind   . Lower extremity edema   . Morbid obesity ([place])   . Neuropathy   . NSTEMI (non-ST elevated myocardial infarction) ([place]) 05/2017  . OSA on CPAP   . PAF (paroxysmal atrial fibrillation) ([name])    a. confirmed by event monitor. b. 02/2014 rash on Coumadin, patient decided to discontinue Xarelto due to possible rash, cost and lawyers ads on TV, agreed to take Plavix.  . Pneumonia 2016  . Tunnel vision    Since stroke  . Type II diabetes mellitus ([place])   . Venous stasis   . Weakness 01/11/2017   Social History   Socioeconomic History  . Marital status: Married    Spouse name: Not on file  . Number of children: Not on file  . Years of education: Not on file  . Highest education level: Not on file  Occupational History  . Not on file  Social Needs  . Financial resource strain: Not on file  . Food insecurity    Worry: Not on file    Inability: Not on file  . Transportation needs    Medical:  Not on file    Non-medical: Not on file  Tobacco Use  . Smoking status: Former Smoker    Packs/day: 1.00    Years: 30.00    Pack years: 30.00    Types: Cigarettes, Cigars  . Smokeless tobacco: Former Systems developer    Quit date: 08/15/2016  . Tobacco comment: 12/2016 quit smoking in 1998  Substance and Sexual Activity  . Alcohol use: Yes    Comment: 12/2016   rare   . Drug use: No  . Sexual activity: Yes  Lifestyle  . Physical activity    Days per week: Not on file    Minutes per session: Not on file  . Stress: Not on file  Relationships  . Social Herbalist on phone: Not on file    Gets together: Not on file    Attends religious service: Not on file    Active member of club or organization: Not on file    Attends meetings of clubs or organizations: Not on file    Relationship status: Not on file  Other Topics Concern  .  Not on file  Social History Narrative   Disabled s/p CVA   Family History  Problem Relation Age of Onset  . Breast cancer Mother 55  . Heart disease Father 21  . Asthma Neg Hx    Scheduled Meds: . feeding supplement (ENSURE ENLIVE)  237 mL Oral TID BM  . finasteride  5 mg Oral Daily  . furosemide  40 mg Intravenous Q12H  . insulin aspart  0-15 Units Subcutaneous TID WC  . insulin aspart  0-5 Units Subcutaneous QHS  . metoprolol succinate  25 mg Oral Daily  . pravastatin  40 mg Oral q1800  . sodium chloride flush  3 mL Intravenous Q12H  . Warfarin - Pharmacist Dosing Inpatient   Does not apply q1800   Continuous Infusions: . sodium chloride     PRN Meds:.sodium chloride, acetaminophen, morphine injection, nitroGLYCERIN, ondansetron (ZOFRAN) IV, sodium chloride flush Allergies  Allergen Reactions  . Tolterodine Swelling and Other (See Comments)    (Detrol) Dry mouth/dry throat, severe swelling of the legs, and started a "downward spiral"  . Lipitor [Atorvastatin] Other (See Comments)    Muscle aches  . Xarelto [Rivaroxaban] Rash    Vital Signs: BP 130/82 (BP Location: Right Arm)   Pulse 83   Temp 98 F (36.7 C) (Oral)   Resp 20   Ht 6\' 4"  (1.93 m)   Wt 131.4 kg   SpO2 92%   BMI 35.26 kg/m  Pain Scale: 0-10   Pain Score: Asleep   SpO2: SpO2: 92 % O2 Device:SpO2: 92 % O2 Flow Rate: .O2 Flow Rate (L/min): 3 L/min  IO: Intake/output summary:   Intake/Output Summary (Last 24 hours) at 09/20/2019 1032 Last data filed at 09/20/2019 0845 Gross per 24 hour  Intake 170 ml  Output -  Net 170 ml    LBM: Last BM Date: 09/19/19 Baseline Weight: Weight: 131.4 kg Most recent weight: Weight: 131.4 kg     Palliative Assessment/Data: PPS 20% d/t PO intake    The above conversation was completed via telephone due to the visitor restrictions during the COVID-19 pandemic. Thorough chart review and discussion with necessary members of the care team was completed as part of  assessment. All issues were discussed and addressed but no physical exam was performed.  Time Total: 70 minutes Greater than 50%  of this time was spent counseling and coordinating  care related to the above assessment and plan.  Juel Burrow, DNP, AGNP-C Palliative Medicine Team [phone] Pager: [phone]

## 2019-09-20 NOTE — Progress Notes (Addendum)
Charles [name] to be D/C'd to [facility] per MD order.  Discussed with the patient and all questions fully answered.  VSS, Skin clean and dry. IV catheter intact.  An After Visit [place] was printed and given to facility.   Patient escorted via [place] 09/20/2019 8:37 PM

## 2019-09-20 NOTE — Progress Notes (Addendum)
Patient will DC to: [facility] Anticipated DC date: 09/20/2019 Family notified: Dub Mikes( wife) Transport by: Corey Harold   Per MD patient ready for DC to [facility]. RN, patient, patient's wife notified of DC. RN to call report prior to discharge [phone]). DC packet on chart. Ambulance transport requested for patient.   RNCM will sign off for now as intervention is no longer needed. Please consult [place] again if new needs arise.  Jacian Shields (Spouse)     [phone]

## 2019-09-20 NOTE — Progress Notes (Signed)
Per night shift RN Levada Dy, Pt's wife does not want pt on PC monitor or any type of monitoring. Palliative is consulted. Will continue to monitor.

## 2019-09-21 ENCOUNTER — Telehealth: Payer: Self-pay | Admitting: *Deleted

## 2019-09-21 NOTE — Telephone Encounter (Signed)
Pt was on TCM report he was brought into the ED from his nursing home after he was found to have some shortness of breath and hypoxia. Nursing staff found him in his room having some shortness of breath and his oxygen saturation was 70% on room air. He was slightly altered and was admitted 09/18/19 for evaluation. Further work-up included chest x-ray which showed congestive heart failure and bilateral pleural effusion, right more than the left. Due to his multiple hospitalizations, refusal to take his medications and food at previous SNF, and overall deterioration and poor quality of life, wife discussed with palliative care and decided to focus on comfort and transition to hospice. Pt D/C 09/20/19 to hospice care.Marland KitchenJohny Chess

## 2019-09-24 ENCOUNTER — Telehealth: Payer: Self-pay | Admitting: Internal Medicine

## 2019-09-24 NOTE — Telephone Encounter (Signed)
Copied from [place] [id number]. Topic: General - Other >> Sep 24, 2019 10:51 AM Rainey Pines A wrote: [facility] from [facility] called to inform Dr. Alain Marion that patient passed away yesterday at 12:47am. [place] social worker can be reached at [phone]

## 2019-09-27 ENCOUNTER — Telehealth: Payer: Self-pay | Admitting: Internal Medicine

## 2019-09-27 NOTE — Telephone Encounter (Signed)
Copied from [place] [id number]. Topic: General - Other >> Sep 27, 2019 10:03 AM Ivar Drape wrote: Reason for CRM: Patient's spouse, Charlotte Domanski, would like a call back from Dr. Alain Marion or his medical assistant.  Her husband passed away.

## 2019-09-28 NOTE — Telephone Encounter (Signed)
I did call and we talked with Joaquim Lai yesterday

## 2019-09-28 NOTE — Telephone Encounter (Signed)
I called Charles [name] on 10/15 and we talked.

## 2019-10-08 ENCOUNTER — Ambulatory Visit: Payer: Medicare Other | Admitting: Internal Medicine

## 2019-10-14 DEATH — deceased

## 2020-09-05 IMAGING — DX DG CHEST 1V
1 series · 1 of 1 positions shown · non-contrast
Comparison: Chest radiograph dated [DATE]

CLINICAL DATA: 77-year-old male with central line placement.

EXAM:
CHEST  1 VIEW

[chest]
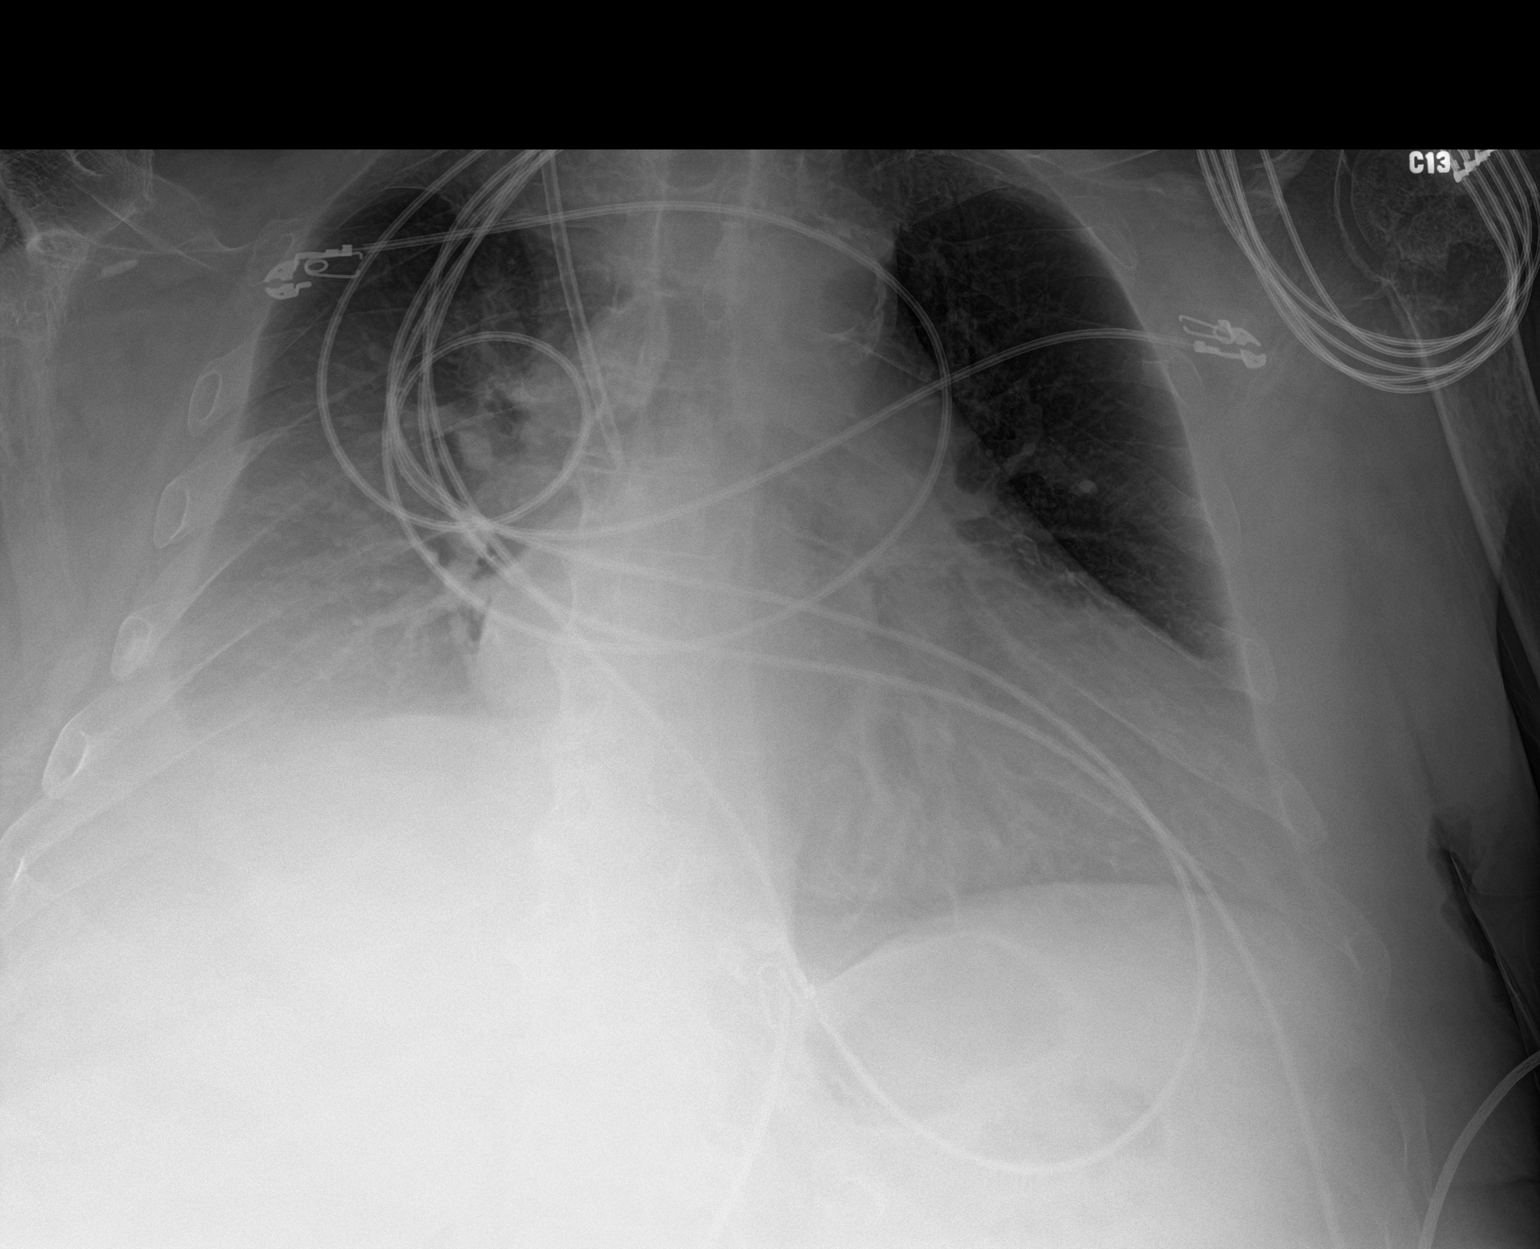

[1 of 1 positions shown; findings below may reference images not displayed]

FINDINGS: Right IJ central venous line with tip close to the cavoatrial
junction. No pneumothorax. There is a right pleural effusion with
right lung base atelectasis. Infiltrate is not excluded. Clinical
correlation is recommended. The left lung is clear. Stable
cardiomegaly. Atherosclerotic calcification of the aortic arch. No
acute osseous pathology. Osteopenia with degenerative changes of the
spine. Old left humeral neck fracture.
IMPRESSION: 1. Right IJ central venous line with tip close to the cavoatrial
junction. No pneumothorax.
2. Right pleural effusion with right lung base atelectasis versus
infiltrate.

## 2020-09-08 IMAGING — DX DG HUMERUS 2V *L*
3 series · 3 of 3 positions shown · non-contrast
Comparison: None.

CLINICAL DATA: Recent fall. Left humerus pain.

EXAM:
LEFT HUMERUS - 2+ VIEW

[humerus ap]
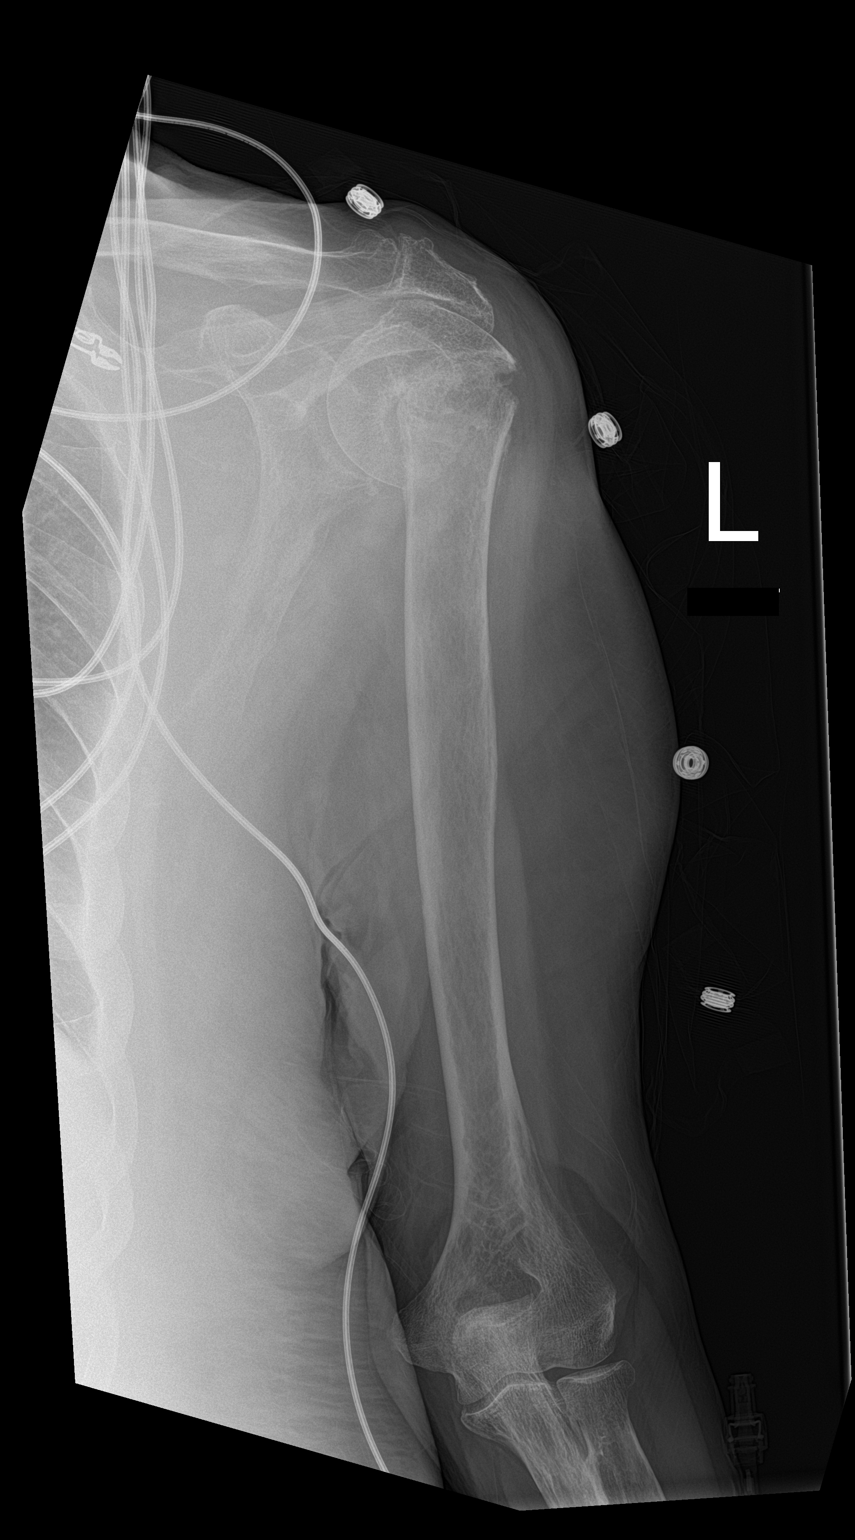

[humerus lat (1 of 2)]
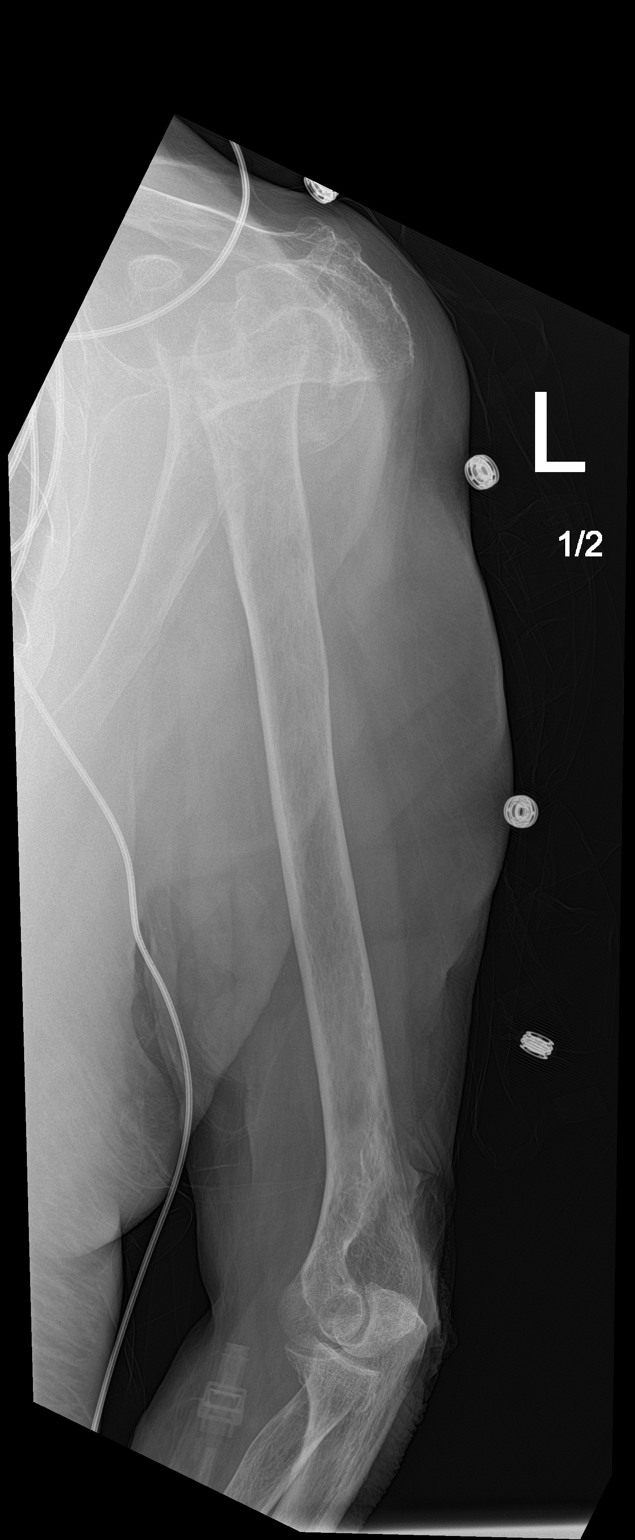

[humerus lat (2 of 2)]
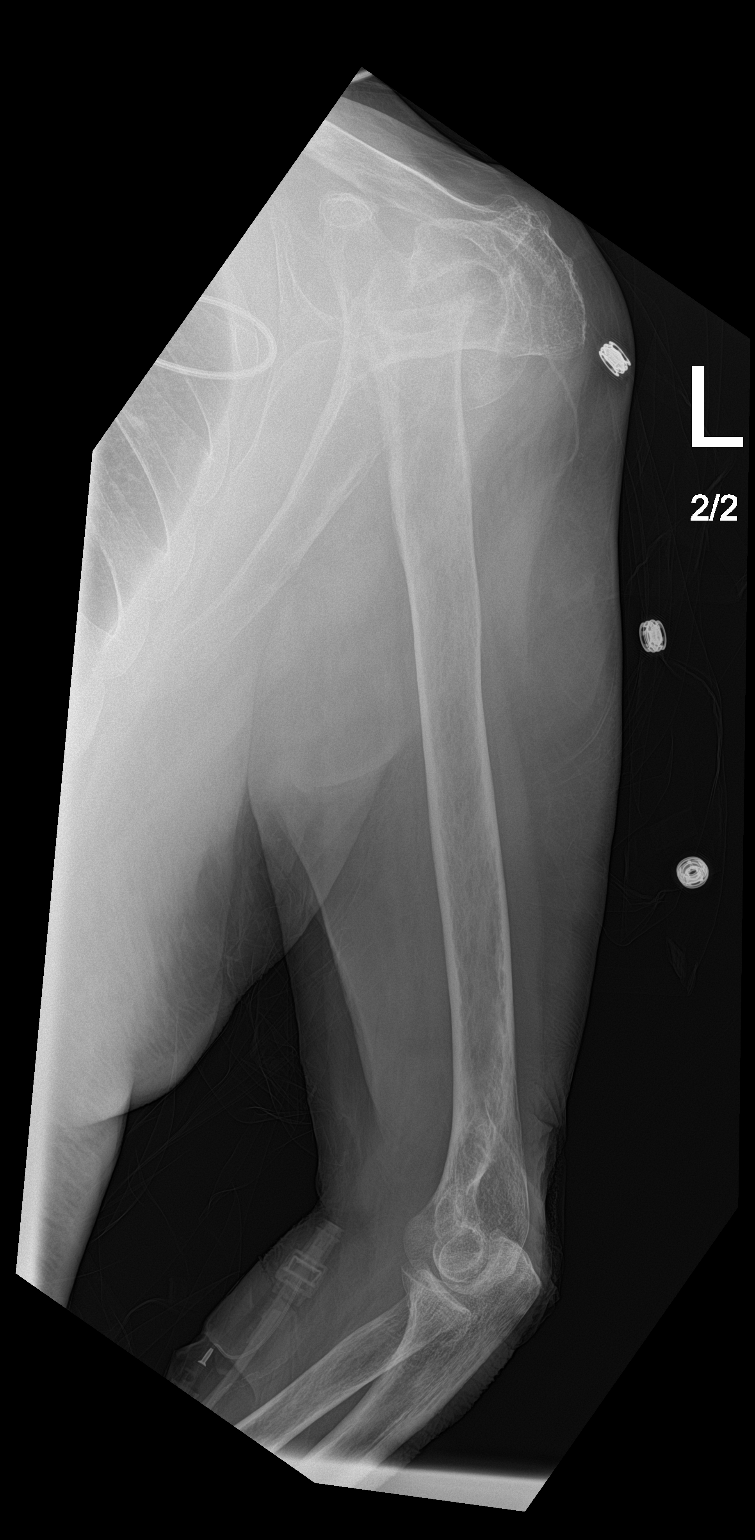

[3 of 3 positions shown; findings below may reference images not displayed]

FINDINGS: An impacted fracture of the left humeral neck is seen. No evidence
of shoulder dislocation. Generalized osteopenia is demonstrated.
Degenerative spurring of acromioclavicular joint and distal acromion
process noted.
IMPRESSION: Impacted fracture of left humeral neck.
